# Patient Record
Sex: Male | Born: 1935 | Race: White | Hispanic: No | Marital: Married | Smoking: Never smoker
Health system: Southern US, Community
[De-identification: ages and names within clinical notes are randomized; demographics above are authoritative.]

## PROBLEM LIST (undated history)

## (undated) DIAGNOSIS — E7800 Pure hypercholesterolemia, unspecified: Secondary | ICD-10-CM

## (undated) DIAGNOSIS — Z9289 Personal history of other medical treatment: Secondary | ICD-10-CM

## (undated) DIAGNOSIS — I779 Disorder of arteries and arterioles, unspecified: Secondary | ICD-10-CM

## (undated) DIAGNOSIS — I774 Celiac artery compression syndrome: Secondary | ICD-10-CM

## (undated) DIAGNOSIS — H9190 Unspecified hearing loss, unspecified ear: Secondary | ICD-10-CM

## (undated) DIAGNOSIS — I10 Essential (primary) hypertension: Secondary | ICD-10-CM

## (undated) DIAGNOSIS — I771 Stricture of artery: Secondary | ICD-10-CM

## (undated) DIAGNOSIS — C801 Malignant (primary) neoplasm, unspecified: Secondary | ICD-10-CM

## (undated) DIAGNOSIS — I2510 Atherosclerotic heart disease of native coronary artery without angina pectoris: Secondary | ICD-10-CM

## (undated) DIAGNOSIS — M160 Bilateral primary osteoarthritis of hip: Secondary | ICD-10-CM

## (undated) DIAGNOSIS — M6740 Ganglion, unspecified site: Secondary | ICD-10-CM

## (undated) DIAGNOSIS — I739 Peripheral vascular disease, unspecified: Secondary | ICD-10-CM

## (undated) DIAGNOSIS — Z87442 Personal history of urinary calculi: Secondary | ICD-10-CM

## (undated) HISTORY — DX: Disorder of arteries and arterioles, unspecified: I77.9

## (undated) HISTORY — DX: Personal history of other medical treatment: Z92.89

## (undated) HISTORY — DX: Peripheral vascular disease, unspecified: I73.9

## (undated) HISTORY — PX: COLONOSCOPY: SHX174

## (undated) HISTORY — DX: Pure hypercholesterolemia, unspecified: E78.00

## (undated) HISTORY — PX: NO PAST SURGERIES: SHX2092

## (undated) HISTORY — DX: Essential (primary) hypertension: I10

---

## 2008-11-27 ENCOUNTER — Emergency Department: Payer: Self-pay | Admitting: Unknown Physician Specialty

## 2008-12-07 ENCOUNTER — Emergency Department: Payer: Self-pay | Admitting: Emergency Medicine

## 2012-05-19 ENCOUNTER — Telehealth: Payer: Self-pay | Admitting: Internal Medicine

## 2012-05-19 ENCOUNTER — Ambulatory Visit: Payer: Self-pay | Admitting: Internal Medicine

## 2012-05-19 NOTE — Telephone Encounter (Signed)
Pt had appt today at 8:00 a.m., thought his appt was at 2:00 so was not seen.  Pt was r/s to 07/2012 for his new pt appointment but will run out of his meds in the next few weeks.  Pt is concerned as he does not have any other doctor that can get his meds refilled for him.  Please advise.  [phone] wife's cell or [phone] Jasson's cell but he does not keep his phone on much.  Would prefer to call Chuong Casebeer (wife).

## 2012-05-19 NOTE — Telephone Encounter (Signed)
Do you want me to send enough rx's to last him until his appointment?

## 2012-05-20 ENCOUNTER — Telehealth: Payer: Self-pay | Admitting: Internal Medicine

## 2012-05-20 NOTE — Telephone Encounter (Signed)
Agrees that pt will be seen Fri 3/21 at 3:30 for a new pt appt.  Pt is aware of appt.

## 2012-05-20 NOTE — Telephone Encounter (Signed)
See if he can come in on Friday 04/24/12 at 3:30.   New pt

## 2012-05-22 ENCOUNTER — Encounter: Payer: Self-pay | Admitting: Internal Medicine

## 2012-05-22 ENCOUNTER — Ambulatory Visit (INDEPENDENT_AMBULATORY_CARE_PROVIDER_SITE_OTHER): Payer: Medicare Other | Admitting: Internal Medicine

## 2012-05-22 VITALS — BP 140/80 | HR 69 | Temp 97.9°F | Ht 70.5 in | Wt 215.0 lb

## 2012-05-22 DIAGNOSIS — E7800 Pure hypercholesterolemia, unspecified: Secondary | ICD-10-CM

## 2012-05-22 DIAGNOSIS — I10 Essential (primary) hypertension: Secondary | ICD-10-CM

## 2012-05-22 DIAGNOSIS — Z125 Encounter for screening for malignant neoplasm of prostate: Secondary | ICD-10-CM

## 2012-05-24 ENCOUNTER — Encounter: Payer: Self-pay | Admitting: Internal Medicine

## 2012-05-24 DIAGNOSIS — E7800 Pure hypercholesterolemia, unspecified: Secondary | ICD-10-CM | POA: Insufficient documentation

## 2012-05-24 DIAGNOSIS — E785 Hyperlipidemia, unspecified: Secondary | ICD-10-CM | POA: Insufficient documentation

## 2012-05-24 DIAGNOSIS — I10 Essential (primary) hypertension: Secondary | ICD-10-CM | POA: Insufficient documentation

## 2012-05-24 NOTE — Assessment & Plan Note (Signed)
Blood pressure as outlined.  Have him spot check his pressure and record.  Get him back in soon to reassess.  Adjust medication of needed.  Check metabolic panel.

## 2012-05-24 NOTE — Assessment & Plan Note (Signed)
On simvastatin.  Low cholesterol diet and exercise.  Check lipid panel and liver function.  

## 2012-05-24 NOTE — Progress Notes (Signed)
  Subjective:    Patient ID: Brett Riley, male    DOB: 11-07-1935, 77 y.o.   MRN: [id number]  HPI 77 year old male with past history of hypertension and hypercholesterolemia who comes in today to establish care.  Was previously followed by Mariah Milling and most recently Dr Alison Murray.  States he stays active.  Still works 60 plus hours per week.  No chest pain or tightness with increased activity or exertion.  Eating and drinking well.  No nausea or vomiting.  No bowel change.  States he is up to date with colonoscopy.     Past Medical History  Diagnosis Date  . Hypercholesterolemia   . Hypertension     Outpatient Encounter Prescriptions as of 05/22/2012  Medication Sig Dispense Refill  . aspirin 81 MG tablet Take 81 mg by mouth daily.      . hydrochlorothiazide (HYDRODIURIL) 25 MG tablet Take 25 mg by mouth daily.      . metoprolol succinate (TOPROL-XL) 100 MG 24 hr tablet Take 100 mg by mouth daily. Take with or immediately following a meal.      . Multiple Vitamin (MULTIVITAMIN) tablet Take 1 tablet by mouth daily.      . simvastatin (ZOCOR) 20 MG tablet Take 20 mg by mouth every evening.      . [DISCONTINUED] metoprolol (LOPRESSOR) 100 MG tablet Take 100 mg by mouth daily.       No facility-administered encounter medications on file as of 05/22/2012.    Review of Systems Patient denies any headache, lightheadedness or dizziness.  No sinus or allergy symptoms.  No chest pain, tightness or palpitations.  No increased shortness of breath, cough or congestion.  No nausea or vomiting.  No acid reflux.   No abdominal pain or cramping.  No bowel change, such as diarrhea, constipation, BRBPR or melana.  No urine change.   Stays active.  No cardiac symptoms with increased activity or exertion.      Objective:   Physical Exam Filed Vitals:   05/22/12 1542  BP: 140/80  Pulse: 69  Temp: 97.9 F (48.43 C)   77 year old male in no acute distress.  HEENT:  Nares - clear.  Oropharynx -  without lesions. NECK:  Supple.  Nontender.  No audible carotid bruit.  HEART:  Appears to be regular.   LUNGS:  No crackles or wheezing audible.  Respirations even and unlabored.   RADIAL PULSE:  Equal bilaterally.  ABDOMEN:  Soft.  Nontender.  Bowel sounds present and normal.  No audible abdominal bruit.   EXTREMITIES:  No increased edema present.  DP pulses palpable and equal bilaterally.         Assessment & Plan:  CARDIOVASCULAR.  Stays active.  No cardiac symptoms with increased activity or exertion.  Follow.   PULMONARY.  Breathing stable.    HEALTH MAINTENANCE.  Schedule a physical next visit.  Obtain colonoscopy results.  States had a colonoscopy 4-5 years ago.  Check PSA and routine labs.  Obtain records to review.

## 2012-06-01 ENCOUNTER — Other Ambulatory Visit (INDEPENDENT_AMBULATORY_CARE_PROVIDER_SITE_OTHER): Payer: Medicare Other

## 2012-06-01 DIAGNOSIS — E7800 Pure hypercholesterolemia, unspecified: Secondary | ICD-10-CM

## 2012-06-01 DIAGNOSIS — I10 Essential (primary) hypertension: Secondary | ICD-10-CM

## 2012-06-01 DIAGNOSIS — Z125 Encounter for screening for malignant neoplasm of prostate: Secondary | ICD-10-CM

## 2012-06-01 LAB — HEPATIC FUNCTION PANEL
ALT: 20 U/L (ref 0–53)
AST: 25 U/L (ref 0–37)
Albumin: 4.1 g/dL (ref 3.5–5.2)
Alkaline Phosphatase: 66 U/L (ref 39–117)
Bilirubin, Direct: 0.1 mg/dL (ref 0.0–0.3)
Total Bilirubin: 0.9 mg/dL (ref 0.3–1.2)
Total Protein: 7.6 g/dL (ref 6.0–8.3)

## 2012-06-01 LAB — CBC WITH DIFFERENTIAL/PLATELET
Basophils Absolute: 0 10*3/uL (ref 0.0–0.1)
Basophils Relative: 0.6 % (ref 0.0–3.0)
Eosinophils Absolute: 0.2 10*3/uL (ref 0.0–0.7)
Eosinophils Relative: 3.8 % (ref 0.0–5.0)
HCT: 39.6 % (ref 39.0–52.0)
Hemoglobin: 13.4 g/dL (ref 13.0–17.0)
Lymphocytes Relative: 39.4 % (ref 12.0–46.0)
Lymphs Abs: 1.6 10*3/uL (ref 0.7–4.0)
MCHC: 33.9 g/dL (ref 30.0–36.0)
MCV: 91.9 fl (ref 78.0–100.0)
Monocytes Absolute: 0.4 10*3/uL (ref 0.1–1.0)
Monocytes Relative: 9.6 % (ref 3.0–12.0)
Neutro Abs: 1.9 10*3/uL (ref 1.4–7.7)
Neutrophils Relative %: 46.6 % (ref 43.0–77.0)
Platelets: 149 10*3/uL — ABNORMAL LOW (ref 150.0–400.0)
RBC: 4.31 Mil/uL (ref 4.22–5.81)
RDW: 15.2 % — ABNORMAL HIGH (ref 11.5–14.6)
WBC: 4 10*3/uL — ABNORMAL LOW (ref 4.5–10.5)

## 2012-06-01 LAB — BASIC METABOLIC PANEL WITH GFR
BUN: 21 mg/dL (ref 6–23)
CO2: 28 meq/L (ref 19–32)
Calcium: 9.4 mg/dL (ref 8.4–10.5)
Chloride: 101 meq/L (ref 96–112)
Creatinine, Ser: 0.9 mg/dL (ref 0.4–1.5)
GFR: 88.7 mL/min
Glucose, Bld: 99 mg/dL (ref 70–99)
Potassium: 4 meq/L (ref 3.5–5.1)
Sodium: 137 meq/L (ref 135–145)

## 2012-06-01 LAB — LIPID PANEL
Cholesterol: 149 mg/dL (ref 0–200)
HDL: 36.7 mg/dL — ABNORMAL LOW
LDL Cholesterol: 98 mg/dL (ref 0–99)
Total CHOL/HDL Ratio: 4
Triglycerides: 71 mg/dL (ref 0.0–149.0)
VLDL: 14.2 mg/dL (ref 0.0–40.0)

## 2012-06-01 LAB — PSA, MEDICARE: PSA: 0.73 ng/mL (ref 0.10–4.00)

## 2012-06-01 LAB — TSH: TSH: 2.12 u[IU]/mL (ref 0.35–5.50)

## 2012-06-06 ENCOUNTER — Telehealth: Payer: Self-pay | Admitting: Internal Medicine

## 2012-06-06 DIAGNOSIS — D72819 Decreased white blood cell count, unspecified: Secondary | ICD-10-CM

## 2012-06-06 NOTE — Telephone Encounter (Signed)
Pt  Notified of lab results and need for a follow up lab in 2 weeks.  He is coming in 06/16/12 (8:00) for a follow up lab.  Please put on lab schedule.  Pt aware of appt.

## 2012-06-08 NOTE — Telephone Encounter (Signed)
Appointment made

## 2012-06-16 ENCOUNTER — Other Ambulatory Visit (INDEPENDENT_AMBULATORY_CARE_PROVIDER_SITE_OTHER): Payer: Medicare Other

## 2012-06-16 DIAGNOSIS — D72819 Decreased white blood cell count, unspecified: Secondary | ICD-10-CM

## 2012-06-16 LAB — CBC WITH DIFFERENTIAL/PLATELET
Basophils Absolute: 0 10*3/uL (ref 0.0–0.1)
Basophils Relative: 0.7 % (ref 0.0–3.0)
Eosinophils Absolute: 0.2 10*3/uL (ref 0.0–0.7)
Eosinophils Relative: 4.5 % (ref 0.0–5.0)
HCT: 35.4 % — ABNORMAL LOW (ref 39.0–52.0)
Hemoglobin: 12.1 g/dL — ABNORMAL LOW (ref 13.0–17.0)
Lymphocytes Relative: 45 % (ref 12.0–46.0)
Lymphs Abs: 1.9 10*3/uL (ref 0.7–4.0)
MCHC: 34.2 g/dL (ref 30.0–36.0)
MCV: 92.4 fl (ref 78.0–100.0)
Monocytes Absolute: 0.4 10*3/uL (ref 0.1–1.0)
Monocytes Relative: 10.2 % (ref 3.0–12.0)
Neutro Abs: 1.7 10*3/uL (ref 1.4–7.7)
Neutrophils Relative %: 39.6 % — ABNORMAL LOW (ref 43.0–77.0)
Platelets: 150 10*3/uL (ref 150.0–400.0)
RBC: 3.83 Mil/uL — ABNORMAL LOW (ref 4.22–5.81)
RDW: 15 % — ABNORMAL HIGH (ref 11.5–14.6)
WBC: 4.3 10*3/uL — ABNORMAL LOW (ref 4.5–10.5)

## 2012-06-24 ENCOUNTER — Other Ambulatory Visit: Payer: Self-pay | Admitting: Internal Medicine

## 2012-06-24 DIAGNOSIS — D649 Anemia, unspecified: Secondary | ICD-10-CM

## 2012-06-24 NOTE — Progress Notes (Signed)
Orders placed for follow up labs 

## 2012-07-21 ENCOUNTER — Ambulatory Visit (INDEPENDENT_AMBULATORY_CARE_PROVIDER_SITE_OTHER): Payer: Medicare Other | Admitting: Internal Medicine

## 2012-07-21 ENCOUNTER — Encounter: Payer: Self-pay | Admitting: Internal Medicine

## 2012-07-21 ENCOUNTER — Other Ambulatory Visit: Payer: Medicare Other

## 2012-07-21 VITALS — BP 130/80 | HR 65 | Temp 98.8°F | Ht 71.5 in | Wt 215.8 lb

## 2012-07-21 DIAGNOSIS — Z1211 Encounter for screening for malignant neoplasm of colon: Secondary | ICD-10-CM

## 2012-07-21 DIAGNOSIS — D696 Thrombocytopenia, unspecified: Secondary | ICD-10-CM

## 2012-07-21 DIAGNOSIS — E7800 Pure hypercholesterolemia, unspecified: Secondary | ICD-10-CM

## 2012-07-21 DIAGNOSIS — I10 Essential (primary) hypertension: Secondary | ICD-10-CM

## 2012-07-21 DIAGNOSIS — D649 Anemia, unspecified: Secondary | ICD-10-CM

## 2012-07-22 LAB — CBC WITH DIFFERENTIAL/PLATELET
Basophils Absolute: 0.1 10*3/uL (ref 0.0–0.1)
Basophils Relative: 1.2 % (ref 0.0–3.0)
Eosinophils Absolute: 0.2 10*3/uL (ref 0.0–0.7)
Eosinophils Relative: 4.9 % (ref 0.0–5.0)
HCT: 37.4 % — ABNORMAL LOW (ref 39.0–52.0)
Hemoglobin: 13 g/dL (ref 13.0–17.0)
Lymphocytes Relative: 40.3 % (ref 12.0–46.0)
Lymphs Abs: 2 10*3/uL (ref 0.7–4.0)
MCHC: 34.7 g/dL (ref 30.0–36.0)
MCV: 92.7 fl (ref 78.0–100.0)
Monocytes Absolute: 0.5 10*3/uL (ref 0.1–1.0)
Monocytes Relative: 9.8 % (ref 3.0–12.0)
Neutro Abs: 2.2 10*3/uL (ref 1.4–7.7)
Neutrophils Relative %: 43.8 % (ref 43.0–77.0)
Platelets: 141 10*3/uL — ABNORMAL LOW (ref 150.0–400.0)
RBC: 4.04 Mil/uL — ABNORMAL LOW (ref 4.22–5.81)
RDW: 14.9 % — ABNORMAL HIGH (ref 11.5–14.6)
WBC: 4.9 10*3/uL (ref 4.5–10.5)

## 2012-07-22 LAB — IBC PANEL
Iron: 91 ug/dL (ref 42–165)
Saturation Ratios: 21.4 % (ref 20.0–50.0)
Transferrin: 304.4 mg/dL (ref 212.0–360.0)

## 2012-07-22 LAB — FERRITIN: Ferritin: 25.6 ng/mL (ref 22.0–322.0)

## 2012-07-23 LAB — VITAMIN B12: Vitamin B-12: 244 pg/mL (ref 211–911)

## 2012-07-26 ENCOUNTER — Encounter: Payer: Self-pay | Admitting: Internal Medicine

## 2012-07-26 DIAGNOSIS — D649 Anemia, unspecified: Secondary | ICD-10-CM | POA: Insufficient documentation

## 2012-07-26 DIAGNOSIS — D696 Thrombocytopenia, unspecified: Secondary | ICD-10-CM | POA: Insufficient documentation

## 2012-07-26 NOTE — Assessment & Plan Note (Signed)
On simvastatin.  Low cholesterol diet and exercise.  Follow lipid panel and liver function.      

## 2012-07-26 NOTE — Assessment & Plan Note (Signed)
Recheck normal.  Follow.

## 2012-07-26 NOTE — Assessment & Plan Note (Signed)
Recheck iron studies and B12.

## 2012-07-26 NOTE — Assessment & Plan Note (Signed)
Blood pressure - controlled.  Follow metabolic panel.  Continue same medication regimen.

## 2012-07-26 NOTE — Progress Notes (Signed)
  Subjective:    Patient ID: Brett Riley, male    DOB: 18-Nov-1935, 77 y.o.   MRN: [id number]  HPI 77 year old male with past history of hypertension and hypercholesterolemia who comes in today to follow up on these issues as well as for a complete physical exam.  States he stays active.  Still works 60 plus hours per week.  No chest pain or tightness with increased activity or exertion.  Eating and drinking well.  No nausea or vomiting.  No bowel change.  States he is up to date with colonoscopy.  Overall he feels he is doing well.    Past Medical History  Diagnosis Date  . Hypercholesterolemia   . Hypertension     Outpatient Encounter Prescriptions as of 07/21/2012  Medication Sig Dispense Refill  . aspirin 81 MG tablet Take 81 mg by mouth daily.      . hydrochlorothiazide (HYDRODIURIL) 25 MG tablet Take 25 mg by mouth daily.      . metoprolol succinate (TOPROL-XL) 100 MG 24 hr tablet Take 100 mg by mouth daily. Take with or immediately following a meal.      . Multiple Vitamin (MULTIVITAMIN) tablet Take 1 tablet by mouth daily.      . Multiple Vitamins-Iron (MULTIVITAMINS WITH IRON) TABS Take 1 tablet by mouth daily.      . simvastatin (ZOCOR) 20 MG tablet Take 20 mg by mouth every evening.       No facility-administered encounter medications on file as of 07/21/2012.    Review of Systems Patient denies any headache, lightheadedness or dizziness.  No sinus or allergy symptoms.  No chest pain, tightness or palpitations.  No increased shortness of breath, cough or congestion.  No nausea or vomiting.  No acid reflux.   No abdominal pain or cramping.  No bowel change, such as diarrhea, constipation, BRBPR or melana.  No urine change.   Stays active.  No cardiac symptoms with increased activity or exertion.      Objective:   Physical Exam  Filed Vitals:   07/21/12 1547  BP: 130/80  Pulse: 65  Temp: 98.8 F (37.1 C)   Blood pressure recheck:  75/33  77 year old male in no acute  distress.  HEENT:  Nares - clear.  Oropharynx - without lesions. NECK:  Supple.  Nontender.  No audible carotid bruit.  HEART:  Appears to be regular.   LUNGS:  No crackles or wheezing audible.  Respirations even and unlabored.   RADIAL PULSE:  Equal bilaterally.  ABDOMEN:  Soft.  Nontender.  Bowel sounds present and normal.  No audible abdominal bruit.  GU:  Normal descended testicles.  No palpable testicular nodules.   RECTAL:  Could not appreciate any palpable prostate nodules.  Heme negative.   EXTREMITIES:  No increased edema present.  DP pulses palpable and equal bilaterally.         Assessment & Plan:  CARDIOVASCULAR.  Stays active.  No cardiac symptoms with increased activity or exertion.  Follow.   PULMONARY.  Breathing stable.    HEALTH MAINTENANCE.  Physical today.  Need colonoscopy results.  States had a colonoscopy 4-5 years ago.  PSA 06/01/12 - .73.  IFOB today.

## 2012-07-27 ENCOUNTER — Telehealth: Payer: Self-pay | Admitting: Internal Medicine

## 2012-07-27 DIAGNOSIS — I6522 Occlusion and stenosis of left carotid artery: Secondary | ICD-10-CM

## 2012-07-27 NOTE — Telephone Encounter (Signed)
Reviewed pts carotid ultrasound from 2013 (ordered by Dr Alison Murray).  Left internal carotid - 50-69%.  Needs f/u carotid ultrasound.  Pt notified.  Order placed for f/u carotid ultrasound.

## 2012-08-04 ENCOUNTER — Telehealth: Payer: Self-pay | Admitting: Internal Medicine

## 2012-08-04 NOTE — Telephone Encounter (Signed)
Wife/[name] calling about patient who has Left carotid ultrasound yesterday 08/03/2012.  Technician commenting that was unsure if he would need surgery or not.  Told that MD would call report yesterday evening 6/2 or today 6/3.  Now she is worried about results.  Please review and call patient at  [phone]. (see note wife's chart EPIC).

## 2012-08-04 NOTE — Telephone Encounter (Signed)
Wife informed of results,& would like to proceed with Vascular Surgery referral

## 2012-09-14 ENCOUNTER — Other Ambulatory Visit: Payer: Self-pay | Admitting: Internal Medicine

## 2012-09-16 ENCOUNTER — Encounter: Payer: Self-pay | Admitting: Internal Medicine

## 2012-12-10 ENCOUNTER — Other Ambulatory Visit: Payer: Self-pay | Admitting: *Deleted

## 2012-12-10 MED ORDER — SIMVASTATIN 20 MG PO TABS: 20.0000 mg | ORAL_TABLET | Freq: Every evening | ORAL | Status: DC

## 2013-01-21 ENCOUNTER — Ambulatory Visit (INDEPENDENT_AMBULATORY_CARE_PROVIDER_SITE_OTHER): Payer: Medicare Other | Admitting: Internal Medicine

## 2013-01-21 ENCOUNTER — Encounter: Payer: Self-pay | Admitting: Internal Medicine

## 2013-01-21 ENCOUNTER — Encounter (INDEPENDENT_AMBULATORY_CARE_PROVIDER_SITE_OTHER): Payer: Self-pay

## 2013-01-21 VITALS — BP 130/80 | HR 64 | Temp 97.8°F | Ht 71.0 in | Wt 214.0 lb

## 2013-01-21 DIAGNOSIS — I10 Essential (primary) hypertension: Secondary | ICD-10-CM

## 2013-01-21 DIAGNOSIS — I779 Disorder of arteries and arterioles, unspecified: Secondary | ICD-10-CM | POA: Insufficient documentation

## 2013-01-21 DIAGNOSIS — E7800 Pure hypercholesterolemia, unspecified: Secondary | ICD-10-CM

## 2013-01-21 DIAGNOSIS — D649 Anemia, unspecified: Secondary | ICD-10-CM

## 2013-01-21 DIAGNOSIS — I6522 Occlusion and stenosis of left carotid artery: Secondary | ICD-10-CM

## 2013-01-21 DIAGNOSIS — I6529 Occlusion and stenosis of unspecified carotid artery: Secondary | ICD-10-CM

## 2013-01-21 DIAGNOSIS — Z23 Encounter for immunization: Secondary | ICD-10-CM

## 2013-01-21 DIAGNOSIS — D696 Thrombocytopenia, unspecified: Secondary | ICD-10-CM

## 2013-01-21 LAB — HEPATIC FUNCTION PANEL
ALT: 23 U/L (ref 0–53)
AST: 27 U/L (ref 0–37)
Albumin: 4.2 g/dL (ref 3.5–5.2)
Alkaline Phosphatase: 72 U/L (ref 39–117)
Bilirubin, Direct: 0.2 mg/dL (ref 0.0–0.3)
Total Bilirubin: 1.1 mg/dL (ref 0.3–1.2)
Total Protein: 7.3 g/dL (ref 6.0–8.3)

## 2013-01-21 LAB — CBC WITH DIFFERENTIAL/PLATELET
Basophils Absolute: 0 10*3/uL (ref 0.0–0.1)
Basophils Relative: 0.7 % (ref 0.0–3.0)
Eosinophils Absolute: 0.2 10*3/uL (ref 0.0–0.7)
Eosinophils Relative: 4.4 % (ref 0.0–5.0)
HCT: 43.6 % (ref 39.0–52.0)
Hemoglobin: 15 g/dL (ref 13.0–17.0)
Lymphocytes Relative: 36.3 % (ref 12.0–46.0)
Lymphs Abs: 1.8 10*3/uL (ref 0.7–4.0)
MCHC: 34.4 g/dL (ref 30.0–36.0)
MCV: 96.2 fl (ref 78.0–100.0)
Monocytes Absolute: 0.4 10*3/uL (ref 0.1–1.0)
Monocytes Relative: 8.5 % (ref 3.0–12.0)
Neutro Abs: 2.5 10*3/uL (ref 1.4–7.7)
Neutrophils Relative %: 50.1 % (ref 43.0–77.0)
Platelets: 166 10*3/uL (ref 150.0–400.0)
RBC: 4.54 Mil/uL (ref 4.22–5.81)
RDW: 13.5 % (ref 11.5–14.6)
WBC: 4.9 10*3/uL (ref 4.5–10.5)

## 2013-01-21 LAB — BASIC METABOLIC PANEL WITH GFR
BUN: 15 mg/dL (ref 6–23)
CO2: 31 meq/L (ref 19–32)
Calcium: 9.9 mg/dL (ref 8.4–10.5)
Chloride: 102 meq/L (ref 96–112)
Creatinine, Ser: 1.1 mg/dL (ref 0.4–1.5)
GFR: 72.37 mL/min
Glucose, Bld: 99 mg/dL (ref 70–99)
Potassium: 4.3 meq/L (ref 3.5–5.1)
Sodium: 138 meq/L (ref 135–145)

## 2013-01-21 LAB — FERRITIN: Ferritin: 45.1 ng/mL (ref 22.0–322.0)

## 2013-01-21 LAB — LIPID PANEL
Cholesterol: 127 mg/dL (ref 0–200)
HDL: 37.3 mg/dL — ABNORMAL LOW
LDL Cholesterol: 69 mg/dL (ref 0–99)
Total CHOL/HDL Ratio: 3
Triglycerides: 106 mg/dL (ref 0.0–149.0)
VLDL: 21.2 mg/dL (ref 0.0–40.0)

## 2013-01-21 NOTE — Assessment & Plan Note (Addendum)
Iron studies and B12 normal.  Last hgb normal.  Has not given blood recently.  Obtain colonoscopy results.  Need IFOB results.  Recheck cbc today.

## 2013-01-21 NOTE — Assessment & Plan Note (Signed)
Blood pressure - slighlty elevated today.  Spot check his pressure.  Get him back in soon to reassess.   Follow metabolic panel.  Continue same medication regimen.

## 2013-01-21 NOTE — Progress Notes (Signed)
  Subjective:    Patient ID: Brett Riley, male    DOB: 11-23-1935, 78 y.o.   MRN: [id number]  HPI 77 year old male with past history of hypertension and hypercholesterolemia who comes in today for a scheduled follow up.  States he stays active.  Still works 60 plus hours per week.  No chest pain or tightness with increased activity or exertion.  Eating and drinking well.  No nausea or vomiting.  No bowel change.  States he is up to date with colonoscopy.  Overall he feels he is doing well.  Has not given blood recently.     Past Medical History  Diagnosis Date  . Hypercholesterolemia   . Hypertension     Outpatient Encounter Prescriptions as of 01/21/2013  Medication Sig  . aspirin 81 MG tablet Take 81 mg by mouth daily.  . hydrochlorothiazide (HYDRODIURIL) 25 MG tablet TAKE 1 TABLET BY MOUTH DAILY  . metoprolol succinate (TOPROL-XL) 100 MG 24 hr tablet TAKE 1 TABLET BY MOUTH DAILY  . Multiple Vitamins-Iron (MULTIVITAMINS WITH IRON) TABS Take 1 tablet by mouth daily.  . simvastatin (ZOCOR) 20 MG tablet Take 1 tablet (20 mg total) by mouth every evening.  . [DISCONTINUED] Multiple Vitamin (MULTIVITAMIN) tablet Take 1 tablet by mouth daily.    Review of Systems Patient denies any headache, lightheadedness or dizziness.  No sinus or allergy symptoms.  No chest pain, tightness or palpitations.  No increased shortness of breath, cough or congestion.  No nausea or vomiting.  No acid reflux.   No abdominal pain or cramping.  No bowel change, such as diarrhea, constipation, BRBPR or melana.  No urine change.   Stays active.  No cardiac symptoms with increased activity or exertion.      Objective:   Physical Exam  Filed Vitals:   01/21/13 0805  BP: 130/80  Pulse: 64  Temp: 97.8 F (36.6 C)   Blood pressure recheck:  20-61/59  77 year old male in no acute distress.  HEENT:  Nares - clear.  Oropharynx - without lesions. NECK:  Supple.  Nontender.  No audible carotid bruit.  HEART:   Appears to be regular.   LUNGS:  No crackles or wheezing audible.  Respirations even and unlabored.   RADIAL PULSE:  Equal bilaterally.  ABDOMEN:  Soft.  Nontender.  Bowel sounds present and normal.  No audible abdominal bruit.   EXTREMITIES:  No increased edema present.  DP pulses palpable and equal bilaterally.         Assessment & Plan:  CARDIOVASCULAR.  Stays active.  No cardiac symptoms with increased activity or exertion.  Follow.   PULMONARY.  Breathing stable.    HEALTH MAINTENANCE.  Physical last visit.  Need colonoscopy results.  States had a colonoscopy 4-5 years ago.  PSA 06/01/12 - .73.  IFOB given last visit.  No results.  States he returned.  Obtain results.

## 2013-01-21 NOTE — Progress Notes (Signed)
Pre-visit discussion using our clinic review tool. No additional management support is needed unless otherwise documented below in the visit note.  

## 2013-01-21 NOTE — Assessment & Plan Note (Addendum)
Last check still slightly decreased (approximately 140).  Will follow.  Recheck today.

## 2013-01-21 NOTE — Assessment & Plan Note (Signed)
On simvastatin.  Low cholesterol diet and exercise.  Follow lipid panel and liver function.  Recheck today.

## 2013-01-21 NOTE — Assessment & Plan Note (Signed)
Carotid ultrasound - with 50-69% stenosis.  Will refer to vascular surgery for them to follow.

## 2013-01-22 ENCOUNTER — Encounter: Payer: Self-pay | Admitting: *Deleted

## 2013-01-26 ENCOUNTER — Encounter: Payer: Self-pay | Admitting: Internal Medicine

## 2013-01-26 DIAGNOSIS — D649 Anemia, unspecified: Secondary | ICD-10-CM

## 2013-03-15 ENCOUNTER — Other Ambulatory Visit: Payer: Self-pay | Admitting: Internal Medicine

## 2013-03-31 ENCOUNTER — Encounter: Payer: Self-pay | Admitting: Internal Medicine

## 2013-03-31 ENCOUNTER — Ambulatory Visit (INDEPENDENT_AMBULATORY_CARE_PROVIDER_SITE_OTHER): Payer: Medicare HMO | Admitting: Internal Medicine

## 2013-03-31 VITALS — BP 120/80 | HR 62 | Temp 97.7°F | Ht 71.0 in | Wt 217.0 lb

## 2013-03-31 DIAGNOSIS — I10 Essential (primary) hypertension: Secondary | ICD-10-CM

## 2013-03-31 DIAGNOSIS — D696 Thrombocytopenia, unspecified: Secondary | ICD-10-CM

## 2013-03-31 DIAGNOSIS — D649 Anemia, unspecified: Secondary | ICD-10-CM

## 2013-03-31 DIAGNOSIS — E7800 Pure hypercholesterolemia, unspecified: Secondary | ICD-10-CM

## 2013-03-31 DIAGNOSIS — I6529 Occlusion and stenosis of unspecified carotid artery: Secondary | ICD-10-CM

## 2013-03-31 NOTE — Progress Notes (Signed)
Pre-visit discussion using our clinic review tool. No additional management support is needed unless otherwise documented below in the visit note.  

## 2013-04-04 ENCOUNTER — Encounter: Payer: Self-pay | Admitting: Internal Medicine

## 2013-04-04 NOTE — Assessment & Plan Note (Signed)
Blood pressure as outlined.  Follow.   

## 2013-04-04 NOTE — Progress Notes (Signed)
  Subjective:    Patient ID: Brett Riley, male    DOB: [birth year], 78 y.o.   MRN: [id number]  HPI 78 year old male with past history of hypertension and hypercholesterolemia who comes in today for a scheduled follow up.  States he stays active.  Still works 60 plus hours per week.  No chest pain or tightness with increased activity or exertion.  Eating and drinking well.  No nausea or vomiting.  No bowel change.  States he is up to date with colonoscopy.  Overall he feels he is doing well.  Has not given blood recently.  Stays active.     Past Medical History  Diagnosis Date  . Hypercholesterolemia   . Hypertension     Outpatient Encounter Prescriptions as of 03/31/2013  Medication Sig  . aspirin 81 MG tablet Take 81 mg by mouth daily.  . hydrochlorothiazide (HYDRODIURIL) 25 MG tablet TAKE 1 TABLET BY MOUTH EVERY DAY  . metoprolol succinate (TOPROL-XL) 100 MG 24 hr tablet TAKE 1 TABLET BY MOUTH EVERY DAY  . Multiple Vitamins-Iron (MULTIVITAMINS WITH IRON) TABS Take 1 tablet by mouth daily.  . simvastatin (ZOCOR) 20 MG tablet Take 1 tablet (20 mg total) by mouth every evening.    Review of Systems Patient denies any headache, lightheadedness or dizziness.  No sinus or allergy symptoms.  No chest pain, tightness or palpitations.  No increased shortness of breath, cough or congestion.  No nausea or vomiting.  No acid reflux.   No abdominal pain or cramping.  No bowel change, such as diarrhea, constipation, BRBPR or melana.  No urine change.   Stays active.  No cardiac symptoms with increased activity or exertion.      Objective:   Physical Exam  Filed Vitals:   03/31/13 0819  BP: 120/80  Pulse: 62  Temp: 97.7 F (36.5 C)   Blood pressure recheck:  72/3  78 year old male in no acute distress.  HEENT:  Nares - clear.  Oropharynx - without lesions. NECK:  Supple.  Nontender.  No audible carotid bruit.  HEART:  Appears to be regular.   LUNGS:  No crackles or wheezing audible.   Respirations even and unlabored.   RADIAL PULSE:  Equal bilaterally.  ABDOMEN:  Soft.  Nontender.  Bowel sounds present and normal.  No audible abdominal bruit.   EXTREMITIES:  No increased edema present.  DP pulses palpable and equal bilaterally.         Assessment & Plan:  CARDIOVASCULAR.  Stays active.  No cardiac symptoms with increased activity or exertion.  Follow.   PULMONARY.  Breathing stable.    HEALTH MAINTENANCE.  Physical 07/21/12.   States had a colonoscopy 4-5 years ago.  PSA 06/01/12 - .73.

## 2013-04-04 NOTE — Assessment & Plan Note (Signed)
Iron studies and B12 normal.  Last hgb normal.  Has not given blood recently.  Follow.

## 2013-04-04 NOTE — Assessment & Plan Note (Signed)
Last check improved.  Follow.

## 2013-04-04 NOTE — Assessment & Plan Note (Signed)
On simvastatin.  Low cholesterol diet and exercise.  Follow lipid panel and liver function.      

## 2013-04-04 NOTE — Assessment & Plan Note (Signed)
Carotid ultrasound - with 50-69% stenosis.  Was referred to vascular for them to follow.

## 2013-04-19 ENCOUNTER — Other Ambulatory Visit: Payer: Self-pay | Admitting: *Deleted

## 2013-04-19 MED ORDER — SIMVASTATIN 20 MG PO TABS: 20.0000 mg | ORAL_TABLET | Freq: Every evening | ORAL | Status: DC

## 2013-04-19 MED ORDER — METOPROLOL SUCCINATE ER 100 MG PO TB24: ORAL_TABLET | ORAL | Status: DC

## 2013-04-19 MED ORDER — HYDROCHLOROTHIAZIDE 25 MG PO TABS: ORAL_TABLET | ORAL | Status: DC

## 2013-07-28 ENCOUNTER — Encounter: Payer: Self-pay | Admitting: Internal Medicine

## 2013-07-28 ENCOUNTER — Encounter (INDEPENDENT_AMBULATORY_CARE_PROVIDER_SITE_OTHER): Payer: Self-pay

## 2013-07-28 ENCOUNTER — Telehealth: Payer: Self-pay | Admitting: Internal Medicine

## 2013-07-28 ENCOUNTER — Ambulatory Visit (INDEPENDENT_AMBULATORY_CARE_PROVIDER_SITE_OTHER): Payer: Commercial Managed Care - HMO | Admitting: Internal Medicine

## 2013-07-28 VITALS — BP 106/70 | HR 56 | Temp 98.3°F | Ht 71.0 in | Wt 212.2 lb

## 2013-07-28 DIAGNOSIS — D696 Thrombocytopenia, unspecified: Secondary | ICD-10-CM

## 2013-07-28 DIAGNOSIS — I6529 Occlusion and stenosis of unspecified carotid artery: Secondary | ICD-10-CM

## 2013-07-28 DIAGNOSIS — Z1211 Encounter for screening for malignant neoplasm of colon: Secondary | ICD-10-CM

## 2013-07-28 DIAGNOSIS — E7800 Pure hypercholesterolemia, unspecified: Secondary | ICD-10-CM

## 2013-07-28 DIAGNOSIS — R5383 Other fatigue: Secondary | ICD-10-CM | POA: Insufficient documentation

## 2013-07-28 DIAGNOSIS — Z125 Encounter for screening for malignant neoplasm of prostate: Secondary | ICD-10-CM

## 2013-07-28 DIAGNOSIS — D649 Anemia, unspecified: Secondary | ICD-10-CM

## 2013-07-28 DIAGNOSIS — Z9109 Other allergy status, other than to drugs and biological substances: Secondary | ICD-10-CM | POA: Insufficient documentation

## 2013-07-28 DIAGNOSIS — R5381 Other malaise: Secondary | ICD-10-CM

## 2013-07-28 DIAGNOSIS — I10 Essential (primary) hypertension: Secondary | ICD-10-CM

## 2013-07-28 LAB — CBC WITH DIFFERENTIAL/PLATELET
Basophils Absolute: 0 10*3/uL (ref 0.0–0.1)
Basophils Relative: 0.7 % (ref 0.0–3.0)
Eosinophils Absolute: 0.2 10*3/uL (ref 0.0–0.7)
Eosinophils Relative: 4.1 % (ref 0.0–5.0)
HCT: 40.8 % (ref 39.0–52.0)
Hemoglobin: 13.9 g/dL (ref 13.0–17.0)
Lymphocytes Relative: 40.3 % (ref 12.0–46.0)
Lymphs Abs: 1.8 10*3/uL (ref 0.7–4.0)
MCHC: 34.2 g/dL (ref 30.0–36.0)
MCV: 96.4 fl (ref 78.0–100.0)
Monocytes Absolute: 0.5 10*3/uL (ref 0.1–1.0)
Monocytes Relative: 9.9 % (ref 3.0–12.0)
Neutro Abs: 2 10*3/uL (ref 1.4–7.7)
Neutrophils Relative %: 45 % (ref 43.0–77.0)
Platelets: 164 10*3/uL (ref 150.0–400.0)
RBC: 4.23 Mil/uL (ref 4.22–5.81)
RDW: 13.8 % (ref 11.5–15.5)
WBC: 4.6 10*3/uL (ref 4.0–10.5)

## 2013-07-28 LAB — PSA, MEDICARE: PSA: 0.79 ng/mL (ref 0.10–4.00)

## 2013-07-28 LAB — LIPID PANEL
Cholesterol: 126 mg/dL (ref 0–200)
HDL: 35.4 mg/dL — ABNORMAL LOW
LDL Cholesterol: 75 mg/dL (ref 0–99)
Total CHOL/HDL Ratio: 4
Triglycerides: 78 mg/dL (ref 0.0–149.0)
VLDL: 15.6 mg/dL (ref 0.0–40.0)

## 2013-07-28 LAB — HEPATIC FUNCTION PANEL
ALT: 26 U/L (ref 0–53)
AST: 26 U/L (ref 0–37)
Albumin: 4.1 g/dL (ref 3.5–5.2)
Alkaline Phosphatase: 61 U/L (ref 39–117)
Bilirubin, Direct: 0.1 mg/dL (ref 0.0–0.3)
Total Bilirubin: 1 mg/dL (ref 0.2–1.2)
Total Protein: 7.2 g/dL (ref 6.0–8.3)

## 2013-07-28 LAB — TSH: TSH: 1.65 u[IU]/mL (ref 0.35–4.50)

## 2013-07-28 LAB — BASIC METABOLIC PANEL WITH GFR
BUN: 18 mg/dL (ref 6–23)
CO2: 29 meq/L (ref 19–32)
Calcium: 9.7 mg/dL (ref 8.4–10.5)
Chloride: 103 meq/L (ref 96–112)
Creatinine, Ser: 1 mg/dL (ref 0.4–1.5)
GFR: 74.71 mL/min
Glucose, Bld: 94 mg/dL (ref 70–99)
Potassium: 4.3 meq/L (ref 3.5–5.1)
Sodium: 140 meq/L (ref 135–145)

## 2013-07-28 NOTE — Progress Notes (Signed)
Pre visit review using our clinic review tool, if applicable. No additional management support is needed unless otherwise documented below in the visit note. 

## 2013-07-28 NOTE — Telephone Encounter (Signed)
[facility]  [facility].  Spoke with Jenny Reichmann , states Dr Sonny Masters noted that no repeat colonoscopy was necessary due to no family hx ,no polyps,and due to age. Do you want [place] to cancel this referral ,and make patient aware.

## 2013-07-28 NOTE — Progress Notes (Signed)
Subjective:    Patient ID: Brett Riley, male    DOB: 1936/02/12, 78 y.o.   MRN: [id number]  HPI 78 year old male with past history of hypertension and hypercholesterolemia who comes in today to follow up on these issues as well as for a complete physical exam.  States he stays active.  Still works 60 plus hours per week.  No chest pain or tightness with increased activity or exertion.  Eating and drinking well.  No nausea or vomiting.  No bowel change.  Overall he feels he is doing well.  Has not given blood recently.  Stays active.  States that starting three weeks ago, he developed some nasal congestion.  Some cough.  No sob.  No sore throat and no fever.  Some fatigue.  Taking mucinex.  Is better.  Feels better.   Blood pressure averaging 100-120s/60-70s.     Past Medical History  Diagnosis Date  . Hypercholesterolemia   . Hypertension     Outpatient Encounter Prescriptions as of 07/28/2013  Medication Sig  . aspirin 81 MG tablet Take 81 mg by mouth daily.  . hydrochlorothiazide (HYDRODIURIL) 25 MG tablet TAKE 1 TABLET BY MOUTH EVERY DAY  . metoprolol succinate (TOPROL-XL) 100 MG 24 hr tablet TAKE 1 TABLET BY MOUTH EVERY DAY  . Multiple Vitamins-Iron (MULTIVITAMINS WITH IRON) TABS Take 1 tablet by mouth daily.  . simvastatin (ZOCOR) 20 MG tablet Take 1 tablet (20 mg total) by mouth every evening.    Review of Systems Patient denies any headache, lightheadedness or dizziness.  Some previous nasal congestion and cough.  Better now.  Took mucinex.   No chest pain, tightness or palpitations.  No increased shortness of breath.   No nausea or vomiting.  No acid reflux.   No abdominal pain or cramping.  No bowel change, such as diarrhea, constipation, BRBPR or melana.  No urine change.   Stays active.  No cardiac symptoms with increased activity or exertion.      Objective:   Physical Exam  Filed Vitals:   07/28/13 0830  BP: 106/70  Pulse: 56  Temp: 98.3 F (36.8 C)   Blood pressure  recheck:  20/57  78 year old male in no acute distress.  HEENT:  Nares - clear.  Oropharynx - without lesions. NECK:  Supple.  Nontender.  No audible carotid bruit.  HEART:  Appears to be regular.   LUNGS:  No crackles or wheezing audible.  Respirations even and unlabored.   RADIAL PULSE:  Equal bilaterally.  ABDOMEN:  Soft.  Nontender.  Bowel sounds present and normal.  No audible abdominal bruit.  GU:  Normal descended testicles.  No palpable testicular nodules.   RECTAL:  Could not appreciate any palpable prostate nodules.  Heme negative.   EXTREMITIES:  No increased edema present.  DP pulses palpable and equal bilaterally.         Assessment & Plan:  CARDIOVASCULAR.  Stays active.  No cardiac symptoms with increased activity or exertion.  Follow.   PULMONARY.  Breathing stable.  Cough and congestion improved.  Do not feel abx warranted.  muncinex and robitussin as directed.  Nasacort as directed.  Follow.    HEALTH MAINTENANCE.  Physical today.   Last colonoscopy 2005.  Per [facility] records - due f/u 2015.  Will refer.  PSA 06/01/12 - .73.   Check PSA today.    I spent 25 minutes with the patient and more than 50% of the time was spent in  consultation regarding the above.

## 2013-07-28 NOTE — Telephone Encounter (Signed)
I would like to proceed with referral.  This pt was 67 when he had his last colonoscopy (2005).  The documentation I received stated - due follow up in 10 years.  I would like to proceed with referral.  Pt is 77 and still working 60 plus hours per week.  Let me know if you need anything else.  Thanks.    Dr Nicki Reaper

## 2013-07-28 NOTE — Patient Instructions (Signed)
Saline nasal spray - flush nose at least 2-3x/day Nasacort - two sprays each nostril one time per day (do this in the evening) Mucinex in the am and Robitussin in the evening.

## 2013-07-30 ENCOUNTER — Encounter: Payer: Self-pay | Admitting: *Deleted

## 2013-08-01 ENCOUNTER — Telehealth: Payer: Self-pay | Admitting: Internal Medicine

## 2013-08-01 ENCOUNTER — Encounter: Payer: Self-pay | Admitting: Internal Medicine

## 2013-08-01 DIAGNOSIS — I6529 Occlusion and stenosis of unspecified carotid artery: Secondary | ICD-10-CM

## 2013-08-01 NOTE — Assessment & Plan Note (Signed)
Colonoscopy as outlined.  Refer to GI for f/u colonoscopy.

## 2013-08-01 NOTE — Assessment & Plan Note (Signed)
Blood pressure as outlined.  Doing well.  Follow.   

## 2013-08-01 NOTE — Assessment & Plan Note (Signed)
Check cbc, met c and tsh.  Follow.  Stays active.

## 2013-08-01 NOTE — Assessment & Plan Note (Signed)
Carotid ultrasound - with 50-69% stenosis.  Was referred to vascular for them to follow.  Continue daily aspirin.

## 2013-08-01 NOTE — Assessment & Plan Note (Signed)
On simvastatin.  Low cholesterol diet and exercise.  Follow lipid panel and liver function.      

## 2013-08-01 NOTE — Telephone Encounter (Signed)
Please notify pt that I would like to schedule him for a f/u regarding his carotid arteries.  I would like to refer him back over the [facility] and have them repeat a carotid ultrasound..  If agreeable, let me know and I will place the order for the referral.

## 2013-08-01 NOTE — Assessment & Plan Note (Signed)
Some nasal congestion and sore throat previously.  Symptoms have improved.  nasacort as directed.  mucinex and robitussin as directed.  Follow.

## 2013-08-01 NOTE — Assessment & Plan Note (Signed)
Last check improved.  Follow.

## 2013-08-02 NOTE — Telephone Encounter (Signed)
LMTCB on cell

## 2013-08-03 NOTE — Telephone Encounter (Signed)
Pt returned my call & okay to proceed with carotid ultrasound

## 2013-08-04 NOTE — Telephone Encounter (Signed)
Order placed for referral to vascular surgery.  

## 2013-08-12 ENCOUNTER — Ambulatory Visit: Payer: Self-pay | Admitting: Gastroenterology

## 2013-08-12 LAB — HM COLONOSCOPY
HM Colonoscopy: NORMAL
HM Colonoscopy: NORMAL

## 2013-08-19 ENCOUNTER — Encounter: Payer: Self-pay | Admitting: *Deleted

## 2013-08-19 DIAGNOSIS — D649 Anemia, unspecified: Secondary | ICD-10-CM

## 2013-09-22 ENCOUNTER — Encounter: Payer: Self-pay | Admitting: Internal Medicine

## 2013-09-22 ENCOUNTER — Telehealth: Payer: Self-pay

## 2013-09-22 DIAGNOSIS — I6529 Occlusion and stenosis of unspecified carotid artery: Secondary | ICD-10-CM

## 2013-09-22 MED ORDER — METOPROLOL SUCCINATE ER 100 MG PO TB24: ORAL_TABLET | ORAL | Status: DC

## 2013-09-22 MED ORDER — HYDROCHLOROTHIAZIDE 25 MG PO TABS: ORAL_TABLET | ORAL | Status: DC

## 2013-09-22 NOTE — Telephone Encounter (Signed)
The patient's wife called and is hoping to get refills on the pt's medication She stated he receives his medication through a [employer] order (fax- [phone])   Refills for:  Hydrochlorothiazide 25mg  Metoprolol - 100mg 

## 2013-09-22 NOTE — Telephone Encounter (Signed)
Rx sent to pharmacy by escript  

## 2013-11-01 ENCOUNTER — Other Ambulatory Visit: Payer: Self-pay | Admitting: *Deleted

## 2013-11-01 MED ORDER — SIMVASTATIN 20 MG PO TABS: 20.0000 mg | ORAL_TABLET | Freq: Every evening | ORAL | Status: DC

## 2013-11-19 ENCOUNTER — Telehealth: Payer: Self-pay | Admitting: *Deleted

## 2013-11-19 NOTE — Telephone Encounter (Signed)
Pt. Wife called to report that the area on his bottom is still there and it is spongy. Wife states that she spoke with you recently about this after his fall. She states that you mentioned that he could go to walk in, get xrays or be seen here. I offered appt on Monday @ 10:30 & he could not come on Monday. Wife states any day except Monday. Please advise.

## 2013-11-22 ENCOUNTER — Encounter: Payer: Self-pay | Admitting: Internal Medicine

## 2013-11-22 ENCOUNTER — Ambulatory Visit (INDEPENDENT_AMBULATORY_CARE_PROVIDER_SITE_OTHER): Payer: Commercial Managed Care - HMO | Admitting: Internal Medicine

## 2013-11-22 VITALS — BP 120/80 | HR 60 | Temp 98.3°F | Ht 71.0 in | Wt 211.5 lb

## 2013-11-22 DIAGNOSIS — R2242 Localized swelling, mass and lump, left lower limb: Secondary | ICD-10-CM

## 2013-11-22 DIAGNOSIS — M7989 Other specified soft tissue disorders: Secondary | ICD-10-CM

## 2013-11-22 DIAGNOSIS — M799 Soft tissue disorder, unspecified: Secondary | ICD-10-CM

## 2013-11-22 DIAGNOSIS — R229 Localized swelling, mass and lump, unspecified: Secondary | ICD-10-CM

## 2013-11-22 DIAGNOSIS — Z23 Encounter for immunization: Secondary | ICD-10-CM

## 2013-11-22 NOTE — Progress Notes (Signed)
Pre visit review using our clinic review tool, if applicable. No additional management support is needed unless otherwise documented below in the visit note. 

## 2013-11-22 NOTE — Telephone Encounter (Signed)
See if he can come 11:45-12:00 on Thursday 11/25/13.

## 2013-11-22 NOTE — Telephone Encounter (Signed)
Pt coming in today at 2pm (had a cancellation)

## 2013-11-25 ENCOUNTER — Encounter: Payer: Self-pay | Admitting: *Deleted

## 2013-11-28 ENCOUNTER — Encounter: Payer: Self-pay | Admitting: Internal Medicine

## 2013-11-28 DIAGNOSIS — M7989 Other specified soft tissue disorders: Secondary | ICD-10-CM | POA: Insufficient documentation

## 2013-11-28 NOTE — Assessment & Plan Note (Signed)
Is s/p fall.  Persistent soft tissue mass.  No significant pain.  Not improving.  Question of hematoma.  Given persistence not improving, will refer to surgery for further evaluation and treatment management.

## 2013-11-28 NOTE — Progress Notes (Signed)
  Subjective:    Patient ID: Brett Riley, male    DOB: September 13, 1935, 78 y.o.   MRN: [id number]  Wound Check  77 year old male with past history of hypertension and hypercholesterolemia who comes in today as a work in with concerns regarding a persistent increased fullness - upper thight/buttock s/p fall.  States he fell several weeks ago.  [place] off a ladder.  Was 3-4 feet off ground.  Ladder flipped.  Landed on his left hip.  No head injury.  States has had a persistent soft tissue mass since the fall.  No increased redness or warmth.  No signficant pain.  Able to walk and work without problems.  Concerned because persistent.  Size not improving.  Taking motrin.      Past Medical History  Diagnosis Date  . Hypercholesterolemia   . Hypertension     Outpatient Encounter Prescriptions as of 11/22/2013  Medication Sig  . aspirin 81 MG tablet Take 81 mg by mouth daily.  . hydrochlorothiazide (HYDRODIURIL) 25 MG tablet TAKE 1 TABLET BY MOUTH EVERY DAY  . metoprolol succinate (TOPROL-XL) 100 MG 24 hr tablet TAKE 1 TABLET BY MOUTH EVERY DAY  . Multiple Vitamins-Iron (MULTIVITAMINS WITH IRON) TABS Take 1 tablet by mouth daily.  . simvastatin (ZOCOR) 20 MG tablet Take 1 tablet (20 mg total) by mouth every evening.    Review of Systems Patient denies any headache, lightheadedness or dizziness.   No chest pain, tightness or palpitations. No increased shortness of breath.  No significant back, hip or leg pain.  Increased soft tissue mass - left buttock.  No increased redness or warmth.       Objective:   Physical Exam  Filed Vitals:   11/22/13 1354  BP: 120/80  Pulse: 60  Temp: 98.3 F (48.51 C)   78 year old male in no acute distress.  HEART:  Appears to be regular.   LUNGS:  No crackles or wheezing audible.  Respirations even and unlabored.    EXTREMITIES:  No increased edema present.   MSK:  No pain with standing or rotation of his hip.  No increased redness or warmth.  Increased soft  tissue mass - left lower buttock.  No significant pain to palpation.          Assessment & Plan:  HEALTH MAINTENANCE.  Physical last visit.   Last colonoscopy 2005.  Per [facility] records - due f/u 2015.  Referred.  PSA 06/01/12 - .73.   PSA 07/28/13 - .79.

## 2013-12-09 ENCOUNTER — Encounter: Payer: Self-pay | Admitting: General Surgery

## 2013-12-09 ENCOUNTER — Ambulatory Visit (INDEPENDENT_AMBULATORY_CARE_PROVIDER_SITE_OTHER): Payer: Commercial Managed Care - HMO | Admitting: General Surgery

## 2013-12-09 VITALS — BP 128/68 | HR 60 | Resp 12 | Ht 72.0 in | Wt 208.0 lb

## 2013-12-09 DIAGNOSIS — R2242 Localized swelling, mass and lump, left lower limb: Secondary | ICD-10-CM

## 2013-12-09 NOTE — Patient Instructions (Addendum)
Patient to use a heating pad. Patient to return as needed.

## 2013-12-09 NOTE — Progress Notes (Signed)
Patient ID: Brett Riley, male   DOB: 07/31/1935, 78 y.o.   MRN: [id number]  Chief Complaint  Patient presents with  . Other    left hip mass    HPI Brett Riley is a 78 y.o. male here today for a evaluation of a left hip mass. Patient states he fell off a ladder  four weeks ago. He had been taking down an old tin roof prior to having a new roof installed.  The latter was on uneven floating, and he fell on the ladder about 4-5 feet. Since that time a mass has been present on the left hip. At one time bruising extended down below the level of the knee.He states there is a knot on his left hip that feels spongy. HPI  Past Medical History  Diagnosis Date  . Hypercholesterolemia   . Hypertension     Past Surgical History  Procedure Laterality Date  . No past surgeries      Family History  Problem Relation Age of Onset  . Heart disease Father     MI  . Stroke Mother   . Breast cancer Sister   . Colon cancer Neg Hx   . Prostate cancer Neg Hx     Social History History  Substance Use Topics  . Smoking status: Never Smoker   . Smokeless tobacco: Never Used  . Alcohol Use: No    No Known Allergies  Current Outpatient Prescriptions  Medication Sig Dispense Refill  . aspirin 81 MG tablet Take 81 mg by mouth daily.      . hydrochlorothiazide (HYDRODIURIL) 25 MG tablet TAKE 1 TABLET BY MOUTH EVERY DAY  90 tablet  1  . metoprolol succinate (TOPROL-XL) 100 MG 24 hr tablet TAKE 1 TABLET BY MOUTH EVERY DAY  90 tablet  1  . Multiple Vitamins-Iron (MULTIVITAMINS WITH IRON) TABS Take 1 tablet by mouth daily.      . simvastatin (ZOCOR) 20 MG tablet Take 1 tablet (20 mg total) by mouth every evening.  30 tablet  0   No current facility-administered medications for this visit.    Review of Systems Review of Systems  Constitutional: Negative.   Respiratory: Negative.   Cardiovascular: Negative.     Blood pressure 128/68, pulse 60, resp. rate 12, height 6' (1.829 m), weight 208  lb (94.348 kg).  Physical Exam Physical Exam  Constitutional: He appears well-developed and well-nourished.  Musculoskeletal:       Legs: Skin:  7 by 10 cm left hip mass       Assessment    Left hip hematoma status post blunt trauma.     Plan    The patient is presently asymptomatic. I've encouraged him to make use of local heat to assist with resolution. Unless he would develop pain or noticed increase in size would avoid drainage.  The patient notify the office he fails to improve.     PCP: Nash Shearer 12/12/2013, 9:05 PM

## 2013-12-12 DIAGNOSIS — R2240 Localized swelling, mass and lump, unspecified lower limb: Secondary | ICD-10-CM | POA: Insufficient documentation

## 2014-02-01 ENCOUNTER — Encounter: Payer: Self-pay | Admitting: Internal Medicine

## 2014-02-01 ENCOUNTER — Ambulatory Visit (INDEPENDENT_AMBULATORY_CARE_PROVIDER_SITE_OTHER): Payer: Commercial Managed Care - HMO | Admitting: Internal Medicine

## 2014-02-01 VITALS — BP 137/73 | HR 56 | Temp 98.3°F | Ht 71.0 in | Wt 207.2 lb

## 2014-02-01 DIAGNOSIS — I10 Essential (primary) hypertension: Secondary | ICD-10-CM

## 2014-02-01 DIAGNOSIS — D696 Thrombocytopenia, unspecified: Secondary | ICD-10-CM

## 2014-02-01 DIAGNOSIS — D649 Anemia, unspecified: Secondary | ICD-10-CM

## 2014-02-01 DIAGNOSIS — R2242 Localized swelling, mass and lump, left lower limb: Secondary | ICD-10-CM

## 2014-02-01 DIAGNOSIS — I6529 Occlusion and stenosis of unspecified carotid artery: Secondary | ICD-10-CM

## 2014-02-01 DIAGNOSIS — Z23 Encounter for immunization: Secondary | ICD-10-CM

## 2014-02-01 DIAGNOSIS — E7800 Pure hypercholesterolemia, unspecified: Secondary | ICD-10-CM

## 2014-02-01 LAB — CBC WITH DIFFERENTIAL/PLATELET
Basophils Absolute: 0 10*3/uL (ref 0.0–0.1)
Basophils Relative: 0.6 % (ref 0.0–3.0)
Eosinophils Absolute: 0.2 10*3/uL (ref 0.0–0.7)
Eosinophils Relative: 4.3 % (ref 0.0–5.0)
HCT: 43.6 % (ref 39.0–52.0)
Hemoglobin: 14.8 g/dL (ref 13.0–17.0)
Lymphocytes Relative: 39.1 % (ref 12.0–46.0)
Lymphs Abs: 1.9 10*3/uL (ref 0.7–4.0)
MCHC: 33.9 g/dL (ref 30.0–36.0)
MCV: 96.4 fl (ref 78.0–100.0)
Monocytes Absolute: 0.4 10*3/uL (ref 0.1–1.0)
Monocytes Relative: 8.2 % (ref 3.0–12.0)
Neutro Abs: 2.3 10*3/uL (ref 1.4–7.7)
Neutrophils Relative %: 47.8 % (ref 43.0–77.0)
Platelets: 165 10*3/uL (ref 150.0–400.0)
RBC: 4.52 Mil/uL (ref 4.22–5.81)
RDW: 13.9 % (ref 11.5–15.5)
WBC: 4.7 10*3/uL (ref 4.0–10.5)

## 2014-02-01 LAB — FERRITIN: Ferritin: 25.5 ng/mL (ref 22.0–322.0)

## 2014-02-01 NOTE — Progress Notes (Signed)
  Subjective:    Patient ID: Brett Riley, male    DOB: [birth year], 78 y.o.   MRN: [id number]  HPI 78 year old male with past history of hypertension and hypercholesterolemia who comes in today for a scheduled follow up.  States he stays active.  Still works 60 plus hours per week.  No chest pain or tightness with increased activity or exertion.  Eating and drinking well.  No nausea or vomiting.  No bowel change.  Overall he feels he is doing well.  Has not given blood recently.  Would like to restart.  Overall feels good.       Past Medical History  Diagnosis Date  . Hypercholesterolemia   . Hypertension     Outpatient Encounter Prescriptions as of 02/01/2014  Medication Sig  . aspirin 81 MG tablet Take 81 mg by mouth daily.  . hydrochlorothiazide (HYDRODIURIL) 25 MG tablet TAKE 1 TABLET BY MOUTH EVERY DAY  . metoprolol succinate (TOPROL-XL) 100 MG 24 hr tablet TAKE 1 TABLET BY MOUTH EVERY DAY  . Multiple Vitamins-Iron (MULTIVITAMINS WITH IRON) TABS Take 1 tablet by mouth daily.  . simvastatin (ZOCOR) 20 MG tablet Take 1 tablet (20 mg total) by mouth every evening.    Review of Systems Patient denies any headache, lightheadedness or dizziness.   No chest pain, tightness or palpitations.  No increased shortness of breath.   No nausea or vomiting.  No acid reflux.   No abdominal pain or cramping.  No bowel change, such as diarrhea, constipation, BRBPR or melana.  No urine change.   Stays active.  No cardiac symptoms with increased activity or exertion.  Resolving hematoma.  Non tender.       Objective:   Physical Exam  Filed Vitals:   02/01/14 0858  BP: 137/73  Pulse: 56  Temp: 98.3 F (36.8 C)   Blood pressure recheck:  25/74  78 year old male in no acute distress.  HEENT:  Nares - clear.  Oropharynx - without lesions. NECK:  Supple.  Nontender.  No audible carotid bruit.  HEART:  Appears to be regular.   LUNGS:  No crackles or wheezing audible.  Respirations even and  unlabored.   RADIAL PULSE:  Equal bilaterally.  ABDOMEN:  Soft.  Nontender.  Bowel sounds present and normal.  No audible abdominal bruit.  EXTREMITIES:  No increased edema present.  DP pulses palpable and equal bilaterally.   MSK:  Still with residual hematoma.  Smaller.  No tenderness or redness.         Assessment & Plan:  Essential hypertension, benign Blood pressure doing well.  Same medication regimen.  Check met b.    Carotid stenosis, unspecified laterality Evaluated [facility] 09/22/13.  Carotid ultrasound 1-49% right ICA and 50-69% left ICA stenosis.  Recommended f/u in 6 months.    Hypercholesterolemia Low cholesterol diet and exercise.  Continue simvastatin.   Check lipid panel and liver function tests.   Thrombocytopenia Recheck cbc today.   Anemia, unspecified anemia type Recheck cbc today.  Colonoscopy as outlined.    Hip region mass, left Resolving hematoma.  Non tender.  Follow.    CARDIOVASCULAR.  Stays active.  No cardiac symptoms with increased activity or exertion.  Follow.   PULMONARY.  Breathing stable.   HEALTH MAINTENANCE.  Physical 07/28/13.   Last colonoscopy 08/12/2013 - normal.  Recommended f/u colonoscopy in 10 years.  PSA 07/28/13 - .79.

## 2014-02-01 NOTE — Addendum Note (Signed)
Addended by: Leeanne Rio on: 02/01/2014 09:49 AM   Modules accepted: Orders

## 2014-02-01 NOTE — Addendum Note (Signed)
Addended by: Johnsie Cancel on: 02/01/2014 09:59 AM   Modules accepted: Orders

## 2014-02-01 NOTE — Progress Notes (Signed)
Pre visit review using our clinic review tool, if applicable. No additional management support is needed unless otherwise documented below in the visit note. 

## 2014-02-02 LAB — LIPID PANEL
Cholesterol: 144 mg/dL (ref 0–200)
HDL: 38.5 mg/dL — ABNORMAL LOW
LDL Cholesterol: 89 mg/dL (ref 0–99)
NonHDL: 105.5
Total CHOL/HDL Ratio: 4
Triglycerides: 85 mg/dL (ref 0.0–149.0)
VLDL: 17 mg/dL (ref 0.0–40.0)

## 2014-02-02 LAB — BASIC METABOLIC PANEL WITH GFR
BUN: 22 mg/dL (ref 6–23)
CO2: 27 meq/L (ref 19–32)
Calcium: 9.6 mg/dL (ref 8.4–10.5)
Chloride: 102 meq/L (ref 96–112)
Creatinine, Ser: 1 mg/dL (ref 0.4–1.5)
GFR: 81.9 mL/min
Glucose, Bld: 90 mg/dL (ref 70–99)
Potassium: 3.9 meq/L (ref 3.5–5.1)
Sodium: 136 meq/L (ref 135–145)

## 2014-02-02 LAB — HEPATIC FUNCTION PANEL
ALT: 20 U/L (ref 0–53)
AST: 26 U/L (ref 0–37)
Albumin: 4.6 g/dL (ref 3.5–5.2)
Alkaline Phosphatase: 70 U/L (ref 39–117)
Bilirubin, Direct: 0.1 mg/dL (ref 0.0–0.3)
Total Bilirubin: 1 mg/dL (ref 0.2–1.2)
Total Protein: 7.5 g/dL (ref 6.0–8.3)

## 2014-02-03 ENCOUNTER — Encounter: Payer: Self-pay | Admitting: *Deleted

## 2014-04-18 ENCOUNTER — Other Ambulatory Visit: Payer: Self-pay | Admitting: Internal Medicine

## 2014-08-08 ENCOUNTER — Encounter: Payer: Commercial Managed Care - HMO | Admitting: Internal Medicine

## 2014-09-06 ENCOUNTER — Encounter: Payer: Self-pay | Admitting: Internal Medicine

## 2014-09-06 ENCOUNTER — Ambulatory Visit (INDEPENDENT_AMBULATORY_CARE_PROVIDER_SITE_OTHER): Payer: Commercial Managed Care - HMO | Admitting: Internal Medicine

## 2014-09-06 VITALS — BP 110/70 | HR 77 | Temp 98.3°F | Ht 71.0 in | Wt 209.5 lb

## 2014-09-06 DIAGNOSIS — Z125 Encounter for screening for malignant neoplasm of prostate: Secondary | ICD-10-CM | POA: Diagnosis not present

## 2014-09-06 DIAGNOSIS — E7800 Pure hypercholesterolemia, unspecified: Secondary | ICD-10-CM

## 2014-09-06 DIAGNOSIS — Z0000 Encounter for general adult medical examination without abnormal findings: Secondary | ICD-10-CM

## 2014-09-06 DIAGNOSIS — I10 Essential (primary) hypertension: Secondary | ICD-10-CM | POA: Diagnosis not present

## 2014-09-06 DIAGNOSIS — D696 Thrombocytopenia, unspecified: Secondary | ICD-10-CM

## 2014-09-06 DIAGNOSIS — I6529 Occlusion and stenosis of unspecified carotid artery: Secondary | ICD-10-CM

## 2014-09-06 DIAGNOSIS — D649 Anemia, unspecified: Secondary | ICD-10-CM

## 2014-09-06 LAB — HEPATIC FUNCTION PANEL
ALT: 19 U/L (ref 0–53)
AST: 20 U/L (ref 0–37)
Albumin: 4.3 g/dL (ref 3.5–5.2)
Alkaline Phosphatase: 70 U/L (ref 39–117)
Bilirubin, Direct: 0.2 mg/dL (ref 0.0–0.3)
Total Bilirubin: 1.1 mg/dL (ref 0.2–1.2)
Total Protein: 7.2 g/dL (ref 6.0–8.3)

## 2014-09-06 LAB — TSH: TSH: 2.41 u[IU]/mL (ref 0.35–4.50)

## 2014-09-06 LAB — LIPID PANEL
Cholesterol: 128 mg/dL (ref 0–200)
HDL: 35.9 mg/dL — ABNORMAL LOW
LDL Cholesterol: 75 mg/dL (ref 0–99)
NonHDL: 92.1
Total CHOL/HDL Ratio: 4
Triglycerides: 87 mg/dL (ref 0.0–149.0)
VLDL: 17.4 mg/dL (ref 0.0–40.0)

## 2014-09-06 LAB — BASIC METABOLIC PANEL WITH GFR
BUN: 20 mg/dL (ref 6–23)
CO2: 30 meq/L (ref 19–32)
Calcium: 9.6 mg/dL (ref 8.4–10.5)
Chloride: 100 meq/L (ref 96–112)
Creatinine, Ser: 1.06 mg/dL (ref 0.40–1.50)
GFR: 72.06 mL/min
Glucose, Bld: 77 mg/dL (ref 70–99)
Potassium: 4 meq/L (ref 3.5–5.1)
Sodium: 139 meq/L (ref 135–145)

## 2014-09-06 LAB — PSA, MEDICARE: PSA: 0.91 ng/mL (ref 0.10–4.00)

## 2014-09-06 MED ORDER — HYDROCHLOROTHIAZIDE 25 MG PO TABS: 25.0000 mg | ORAL_TABLET | Freq: Every day | ORAL | Status: DC

## 2014-09-06 MED ORDER — SIMVASTATIN 20 MG PO TABS: 20.0000 mg | ORAL_TABLET | Freq: Every evening | ORAL | Status: DC

## 2014-09-06 MED ORDER — METOPROLOL SUCCINATE ER 100 MG PO TB24: 100.0000 mg | ORAL_TABLET | Freq: Every day | ORAL | Status: DC

## 2014-09-06 NOTE — Progress Notes (Signed)
Pre visit review using our clinic review tool, if applicable. No additional management support is needed unless otherwise documented below in the visit note. 

## 2014-09-06 NOTE — Progress Notes (Signed)
Patient ID: Brett Riley, male   DOB: [birth year], 79 y.o.   MRN: [id number]   Subjective:    Patient ID: Brett Riley, male    DOB: 1935-12-08, 79 y.o.   MRN: [id number]  HPI  Patient here to follow up on his current medical issues as well as for a complete physical exam.  He is still working a lot of hours.  Tries to stay active.  No cardiac symptoms with increased activity or exertion.  No sob.  Eating and drinking well.  No nausea or vomiting.  No bowel change.     Past Medical History  Diagnosis Date  . Hypercholesterolemia   . Hypertension     Outpatient Encounter Prescriptions as of 09/06/2014  Medication Sig  . aspirin 81 MG tablet Take 81 mg by mouth daily.  . hydrochlorothiazide (HYDRODIURIL) 25 MG tablet Take 1 tablet (25 mg total) by mouth daily.  . metoprolol succinate (TOPROL-XL) 100 MG 24 hr tablet Take 1 tablet (100 mg total) by mouth daily. Take with or immediately following a meal.  . Multiple Vitamins-Iron (MULTIVITAMINS WITH IRON) TABS Take 1 tablet by mouth daily.  . simvastatin (ZOCOR) 20 MG tablet Take 1 tablet (20 mg total) by mouth every evening.  . [DISCONTINUED] hydrochlorothiazide (HYDRODIURIL) 25 MG tablet TAKE 1 TABLET EVERY DAY  . [DISCONTINUED] metoprolol succinate (TOPROL-XL) 100 MG 24 hr tablet TAKE 1 TABLET EVERY DAY  . [DISCONTINUED] simvastatin (ZOCOR) 20 MG tablet TAKE 1 TABLET EVERY EVENING   No facility-administered encounter medications on file as of 09/06/2014.    Review of Systems  Constitutional: Negative for appetite change and unexpected weight change.  HENT: Negative for congestion and sinus pressure.   Eyes: Negative for pain and visual disturbance.  Respiratory: Negative for cough, chest tightness and shortness of breath.   Cardiovascular: Negative for chest pain, palpitations and leg swelling.  Gastrointestinal: Negative for nausea, vomiting, abdominal pain and diarrhea.  Genitourinary: Negative for dysuria and difficulty  urinating.  Musculoskeletal: Negative for back pain and joint swelling.  Skin: Negative for color change and rash.  Neurological: Negative for dizziness, light-headedness and headaches.  Hematological: Negative for adenopathy. Does not bruise/bleed easily.  Psychiatric/Behavioral: Negative for dysphoric mood and agitation.       Objective:     Blood pressure recheck:  128/80  Physical Exam  Constitutional: He is oriented to person, place, and time. He appears well-developed and well-nourished. No distress.  HENT:  Head: Normocephalic and atraumatic.  Nose: Nose normal.  Mouth/Throat: Oropharynx is clear and moist.  Eyes: Conjunctivae are normal. Right eye exhibits no discharge. Left eye exhibits no discharge.  Neck: Neck supple. No thyromegaly present.  Cardiovascular: Normal rate and regular rhythm.   Pulmonary/Chest: Effort normal and breath sounds normal. No respiratory distress. He has no wheezes.  Abdominal: Soft. Bowel sounds are normal. There is no tenderness.  Genitourinary:  Prostate exam reveals no palpable prostate nodules.  Heme negative.    Musculoskeletal: He exhibits no edema or tenderness.  Lymphadenopathy:    He has no cervical adenopathy.  Neurological: He is alert and oriented to person, place, and time.  Skin: Skin is warm and dry. No rash noted. No erythema.  Psychiatric: He has a normal mood and affect. His behavior is normal.    BP 110/70 mmHg  Pulse 77  Temp(Src) 98.3 F (36.8 C) (Oral)  Ht 5\' 11"  (1.803 m)  Wt 209 lb 8 oz (95.029 kg)  BMI 29.23 kg/m2  SpO2  95% Wt Readings from Last 3 Encounters:  09/06/14 209 lb 8 oz (95.029 kg)  02/01/14 207 lb 4 oz (94.008 kg)  12/09/13 208 lb (94.348 kg)     Lab Results  Component Value Date   WBC 4.7 02/01/2014   HGB 14.8 02/01/2014   HCT 43.6 02/01/2014   PLT 165.0 02/01/2014   GLUCOSE 77 09/06/2014   CHOL 128 09/06/2014   TRIG 87.0 09/06/2014   HDL 35.90* 09/06/2014   LDLCALC 75 09/06/2014    ALT 19 09/06/2014   AST 20 09/06/2014   NA 139 09/06/2014   K 4.0 09/06/2014   CL 100 09/06/2014   CREATININE 1.06 09/06/2014   BUN 20 09/06/2014   CO2 30 09/06/2014   TSH 2.41 09/06/2014   PSA 0.91 09/06/2014        Assessment & Plan:   Problem List Items Addressed This Visit    Anemia    Hgb recently wnl.  Colonoscopy 08/12/13 normal.  Recommended f/u in 10 years.        Carotid stenosis    Carotid ultrasound as outlined.  Continues f/u with vascular.        Relevant Medications   hydrochlorothiazide (HYDRODIURIL) 25 MG tablet   metoprolol succinate (TOPROL-XL) 100 MG 24 hr tablet   simvastatin (ZOCOR) 20 MG tablet   Essential hypertension, benign - Primary    Blood pressure doing well.  Same medication regimen.  Follow pressures.        Relevant Medications   hydrochlorothiazide (HYDRODIURIL) 25 MG tablet   metoprolol succinate (TOPROL-XL) 100 MG 24 hr tablet   simvastatin (ZOCOR) 20 MG tablet   Other Relevant Orders   TSH (Completed)   Basic metabolic panel (Completed)   Health care maintenance    Physical today 09/06/14.  Check psa today.  Colonoscopy 08/12/13 - normal.        Hypercholesterolemia    On simvastatin. Low cholesterol diet and exercise.  Follow lipid panel and liver function tests.       Relevant Medications   hydrochlorothiazide (HYDRODIURIL) 25 MG tablet   metoprolol succinate (TOPROL-XL) 100 MG 24 hr tablet   simvastatin (ZOCOR) 20 MG tablet   Other Relevant Orders   Lipid panel (Completed)   Hepatic function panel (Completed)   Thrombocytopenia    Last check wnl.  Follow.         Other Visit Diagnoses    Prostate cancer screening        Relevant Orders    PSA, Medicare (Completed)        Einar Pheasant, MD

## 2014-09-07 ENCOUNTER — Encounter: Payer: Self-pay | Admitting: *Deleted

## 2014-09-11 ENCOUNTER — Encounter: Payer: Self-pay | Admitting: Internal Medicine

## 2014-09-11 DIAGNOSIS — Z0000 Encounter for general adult medical examination without abnormal findings: Secondary | ICD-10-CM | POA: Insufficient documentation

## 2014-09-11 NOTE — Assessment & Plan Note (Signed)
Hgb recently wnl.  Colonoscopy 08/12/13 normal.  Recommended f/u in 10 years.

## 2014-09-11 NOTE — Assessment & Plan Note (Signed)
On simvastatin.  Low cholesterol diet and exercise.  Follow lipid panel and liver function tests.   

## 2014-09-11 NOTE — Assessment & Plan Note (Signed)
Last check wnl.  Follow.   

## 2014-09-11 NOTE — Assessment & Plan Note (Signed)
Physical today 09/06/14.  Check psa today.  Colonoscopy 08/12/13 - normal.

## 2014-09-11 NOTE — Assessment & Plan Note (Signed)
Blood pressure doing well.  Same medication regimen.  Follow pressures.   

## 2014-09-11 NOTE — Assessment & Plan Note (Signed)
Carotid ultrasound as outlined.  Continues f/u with vascular.

## 2015-03-07 ENCOUNTER — Other Ambulatory Visit: Payer: Self-pay

## 2015-03-07 ENCOUNTER — Telehealth: Payer: Self-pay | Admitting: *Deleted

## 2015-03-07 MED ORDER — SIMVASTATIN 20 MG PO TABS: 20.0000 mg | ORAL_TABLET | Freq: Every evening | ORAL | Status: DC

## 2015-03-07 NOTE — Telephone Encounter (Signed)
Patient has requested a authorization for simvastatin, for [employer] mail delivery- [id number] Patient also requested a Rx for simvastatin to be sent to the [facility], due to [facility] mail delivery taking up to 10 days to deliver. Patient is currently out of medication

## 2015-03-08 ENCOUNTER — Other Ambulatory Visit: Payer: Self-pay

## 2015-03-08 MED ORDER — SIMVASTATIN 20 MG PO TABS: 20.0000 mg | ORAL_TABLET | Freq: Every evening | ORAL | Status: DC

## 2015-03-08 NOTE — Telephone Encounter (Signed)
Patient wife requested an update on progress for his request. Please advise

## 2015-03-08 NOTE — Telephone Encounter (Signed)
I sent both requests yesterday.  Have her check with the pharmacy. thanks

## 2015-03-09 ENCOUNTER — Ambulatory Visit (INDEPENDENT_AMBULATORY_CARE_PROVIDER_SITE_OTHER): Payer: Commercial Managed Care - HMO | Admitting: Internal Medicine

## 2015-03-09 ENCOUNTER — Encounter: Payer: Self-pay | Admitting: Internal Medicine

## 2015-03-09 VITALS — BP 120/80 | HR 80 | Temp 97.9°F | Resp 18 | Ht 71.0 in | Wt 212.0 lb

## 2015-03-09 DIAGNOSIS — I10 Essential (primary) hypertension: Secondary | ICD-10-CM

## 2015-03-09 DIAGNOSIS — D696 Thrombocytopenia, unspecified: Secondary | ICD-10-CM | POA: Diagnosis not present

## 2015-03-09 DIAGNOSIS — E7800 Pure hypercholesterolemia, unspecified: Secondary | ICD-10-CM | POA: Diagnosis not present

## 2015-03-09 DIAGNOSIS — I6529 Occlusion and stenosis of unspecified carotid artery: Secondary | ICD-10-CM

## 2015-03-09 DIAGNOSIS — Z23 Encounter for immunization: Secondary | ICD-10-CM | POA: Diagnosis not present

## 2015-03-09 LAB — LIPID PANEL
Cholesterol: 167 mg/dL (ref 0–200)
HDL: 38.1 mg/dL — ABNORMAL LOW
LDL Cholesterol: 103 mg/dL — ABNORMAL HIGH (ref 0–99)
NonHDL: 129.25
Total CHOL/HDL Ratio: 4
Triglycerides: 132 mg/dL (ref 0.0–149.0)
VLDL: 26.4 mg/dL (ref 0.0–40.0)

## 2015-03-09 LAB — CBC WITH DIFFERENTIAL/PLATELET
Basophils Absolute: 0 10*3/uL (ref 0.0–0.1)
Basophils Relative: 0.4 % (ref 0.0–3.0)
Eosinophils Absolute: 0.2 10*3/uL (ref 0.0–0.7)
Eosinophils Relative: 2.9 % (ref 0.0–5.0)
HCT: 46.1 % (ref 39.0–52.0)
Hemoglobin: 15.6 g/dL (ref 13.0–17.0)
Lymphocytes Relative: 25.2 % (ref 12.0–46.0)
Lymphs Abs: 1.9 10*3/uL (ref 0.7–4.0)
MCHC: 33.9 g/dL (ref 30.0–36.0)
MCV: 96.8 fl (ref 78.0–100.0)
Monocytes Absolute: 0.6 10*3/uL (ref 0.1–1.0)
Monocytes Relative: 8.2 % (ref 3.0–12.0)
Neutro Abs: 4.7 10*3/uL (ref 1.4–7.7)
Neutrophils Relative %: 63.3 % (ref 43.0–77.0)
Platelets: 160 10*3/uL (ref 150.0–400.0)
RBC: 4.77 Mil/uL (ref 4.22–5.81)
RDW: 13.7 % (ref 11.5–15.5)
WBC: 7.5 10*3/uL (ref 4.0–10.5)

## 2015-03-09 LAB — BASIC METABOLIC PANEL WITH GFR
BUN: 21 mg/dL (ref 6–23)
CO2: 29 meq/L (ref 19–32)
Calcium: 10 mg/dL (ref 8.4–10.5)
Chloride: 102 meq/L (ref 96–112)
Creatinine, Ser: 1.01 mg/dL (ref 0.40–1.50)
GFR: 76.09 mL/min
Glucose, Bld: 100 mg/dL — ABNORMAL HIGH (ref 70–99)
Potassium: 4.3 meq/L (ref 3.5–5.1)
Sodium: 139 meq/L (ref 135–145)

## 2015-03-09 LAB — HEPATIC FUNCTION PANEL
ALT: 20 U/L (ref 0–53)
AST: 21 U/L (ref 0–37)
Albumin: 4.5 g/dL (ref 3.5–5.2)
Alkaline Phosphatase: 75 U/L (ref 39–117)
Bilirubin, Direct: 0.1 mg/dL (ref 0.0–0.3)
Total Bilirubin: 0.7 mg/dL (ref 0.2–1.2)
Total Protein: 7.4 g/dL (ref 6.0–8.3)

## 2015-03-09 MED ORDER — SIMVASTATIN 20 MG PO TABS: 20.0000 mg | ORAL_TABLET | Freq: Every evening | ORAL | Status: DC

## 2015-03-09 NOTE — Progress Notes (Signed)
Pre-visit discussion using our clinic review tool. No additional management support is needed unless otherwise documented below in the visit note.  

## 2015-03-09 NOTE — Assessment & Plan Note (Signed)
Blood pressure under good control.  Continue same medication regimen.  Follow pressures.  Follow metabolic panel.   

## 2015-03-09 NOTE — Assessment & Plan Note (Signed)
Carotid ultrasound reviewed and outlined in overview.  Due f/u with vascular surgery in 2-3/17.  appt already scheduled.

## 2015-03-09 NOTE — Addendum Note (Signed)
Addended by: Karlene Einstein D on: 03/09/2015 08:34 AM   Modules accepted: Orders

## 2015-03-09 NOTE — Assessment & Plan Note (Signed)
Recheck cbc today.   

## 2015-03-09 NOTE — Assessment & Plan Note (Signed)
On simvastatin.  Ran out several days ago.  Otherwise has been taking regularly.  Check lipid panel and liver function tests.

## 2015-03-09 NOTE — Progress Notes (Signed)
Patient ID: Brett Riley, male   DOB: [birth year], 80 y.o.   MRN: [id number]   Subjective:    Patient ID: Brett Riley, male    DOB: 10-03-1935, 80 y.o.   MRN: [id number]  HPI  Patient with past history of hypercholesterolemia and hypertension who comes in today to follow up on these issues.  He feels good.  Stays active.  No cardiac symptoms with increased activity or exertion.  No sob.  No abdominal pain or cramping.  Bowels stable.  Ran out of cholesterol medication 5-6 days ago.  Taking his other medications regularly.     Past Medical History  Diagnosis Date  . Hypercholesterolemia   . Hypertension    Past Surgical History  Procedure Laterality Date  . No past surgeries     Family History  Problem Relation Age of Onset  . Heart disease Father     MI  . Stroke Mother   . Breast cancer Sister   . Colon cancer Neg Hx   . Prostate cancer Neg Hx    Social History   Social History  . Marital Status: Married    Spouse Name: N/A  . Number of Children: 1  . Years of Education: N/A   Occupational History  .     Social History Main Topics  . Smoking status: Never Smoker   . Smokeless tobacco: Never Used  . Alcohol Use: No  . Drug Use: No  . Sexual Activity: Not Asked   Other Topics Concern  . None   Social History Narrative    Outpatient Encounter Prescriptions as of 03/09/2015  Medication Sig  . aspirin 81 MG tablet Take 81 mg by mouth daily.  . hydrochlorothiazide (HYDRODIURIL) 25 MG tablet Take 1 tablet (25 mg total) by mouth daily.  . metoprolol succinate (TOPROL-XL) 100 MG 24 hr tablet Take 1 tablet (100 mg total) by mouth daily. Take with or immediately following a meal.  . Multiple Vitamins-Iron (MULTIVITAMINS WITH IRON) TABS Take 1 tablet by mouth daily.  . simvastatin (ZOCOR) 20 MG tablet Take 1 tablet (20 mg total) by mouth every evening.  . [DISCONTINUED] simvastatin (ZOCOR) 20 MG tablet Take 1 tablet (20 mg total) by mouth every evening.   No  facility-administered encounter medications on file as of 03/09/2015.    Review of Systems  Constitutional: Negative for fever and appetite change.  HENT: Negative for congestion and sinus pressure.   Respiratory: Negative for cough, chest tightness and shortness of breath.   Cardiovascular: Negative for chest pain, palpitations and leg swelling.  Gastrointestinal: Negative for nausea, vomiting, abdominal pain and diarrhea.  Musculoskeletal: Negative for back pain and joint swelling.  Neurological: Negative for dizziness, light-headedness and headaches.  Psychiatric/Behavioral: Negative for dysphoric mood and agitation.       Objective:     Blood pressure rechecked by me:  132/78  Physical Exam  Constitutional: He appears well-developed and well-nourished. No distress.  HENT:  Nose: Nose normal.  Mouth/Throat: Oropharynx is clear and moist.  Neck: Neck supple. No thyromegaly present.  Cardiovascular: Normal rate and regular rhythm.   Pulmonary/Chest: Effort normal and breath sounds normal. No respiratory distress.  Abdominal: Soft. Bowel sounds are normal. There is no tenderness.  Musculoskeletal: He exhibits no edema or tenderness.  Lymphadenopathy:    He has no cervical adenopathy.  Psychiatric: He has a normal mood and affect. His behavior is normal.    BP 120/80 mmHg  Pulse 80  Temp(Src) 97.9 F (  36.6 C) (Oral)  Resp 18  Ht 5\' 11"  (1.803 m)  Wt 212 lb (96.163 kg)  BMI 29.58 kg/m2  SpO2 93% Wt Readings from Last 3 Encounters:  03/09/15 212 lb (96.163 kg)  09/06/14 209 lb 8 oz (95.029 kg)  02/01/14 207 lb 4 oz (94.008 kg)     Lab Results  Component Value Date   WBC 4.7 02/01/2014   HGB 14.8 02/01/2014   HCT 43.6 02/01/2014   PLT 165.0 02/01/2014   GLUCOSE 77 09/06/2014   CHOL 128 09/06/2014   TRIG 87.0 09/06/2014   HDL 35.90* 09/06/2014   LDLCALC 75 09/06/2014   ALT 19 09/06/2014   AST 20 09/06/2014   NA 139 09/06/2014   K 4.0 09/06/2014   CL 100  09/06/2014   CREATININE 1.06 09/06/2014   BUN 20 09/06/2014   CO2 30 09/06/2014   TSH 2.41 09/06/2014   PSA 0.91 09/06/2014       Assessment & Plan:   Problem List Items Addressed This Visit    Carotid stenosis    Carotid ultrasound reviewed and outlined in overview.  Due f/u with vascular surgery in 2-3/17.  appt already scheduled.        Relevant Medications   simvastatin (ZOCOR) 20 MG tablet   Essential hypertension, benign - Primary    Blood pressure under good control.  Continue same medication regimen.  Follow pressures.  Follow metabolic panel.        Relevant Medications   simvastatin (ZOCOR) 20 MG tablet   Other Relevant Orders   Basic metabolic panel   Hypercholesterolemia    On simvastatin.  Ran out several days ago.  Otherwise has been taking regularly.  Check lipid panel and liver function tests.        Relevant Medications   simvastatin (ZOCOR) 20 MG tablet   Other Relevant Orders   Lipid panel   Hepatic function panel   Thrombocytopenia (HCC)    Recheck cbc today.        Relevant Orders   CBC with Differential/Platelet       Einar Pheasant, MD

## 2015-03-09 NOTE — Patient Instructions (Signed)
Influenza Virus Vaccine injection (Fluarix) What is this medicine? INFLUENZA VIRUS VACCINE (in floo EN zuh VAHY ruhs vak SEEN) helps to reduce the risk of getting influenza also known as the flu. This medicine may be used for other purposes; ask your health care provider or pharmacist if you have questions. What should I tell my health care provider before I take this medicine? They need to know if you have any of these conditions: -bleeding disorder like hemophilia -fever or infection -Guillain-Barre syndrome or other neurological problems -immune system problems -infection with the human immunodeficiency virus (HIV) or AIDS -low blood platelet counts -multiple sclerosis -an unusual or allergic reaction to influenza virus vaccine, eggs, chicken proteins, latex, gentamicin, other medicines, foods, dyes or preservatives -pregnant or trying to get pregnant -breast-feeding How should I use this medicine? This vaccine is for injection into a muscle. It is given by a health care professional. A copy of Vaccine Information Statements will be given before each vaccination. Read this sheet carefully each time. The sheet may change frequently. Talk to your pediatrician regarding the use of this medicine in children. Special care may be needed. Overdosage: If you think you have taken too much of this medicine contact a poison control center or emergency room at once. NOTE: This medicine is only for you. Do not share this medicine with others. What if I miss a dose? This does not apply. What may interact with this medicine? -chemotherapy or radiation therapy -medicines that lower your immune system like etanercept, anakinra, infliximab, and adalimumab -medicines that treat or prevent blood clots like warfarin -phenytoin -steroid medicines like prednisone or cortisone -theophylline -vaccines This list may not describe all possible interactions. Give your health care provider a list of all the  medicines, herbs, non-prescription drugs, or dietary supplements you use. Also tell them if you smoke, drink alcohol, or use illegal drugs. Some items may interact with your medicine. What should I watch for while using this medicine? Report any side effects that do not go away within 3 days to your doctor or health care professional. Call your health care provider if any unusual symptoms occur within 6 weeks of receiving this vaccine. You may still catch the flu, but the illness is not usually as bad. You cannot get the flu from the vaccine. The vaccine will not protect against colds or other illnesses that may cause fever. The vaccine is needed every year. What side effects may I notice from receiving this medicine? Side effects that you should report to your doctor or health care professional as soon as possible: -allergic reactions like skin rash, itching or hives, swelling of the face, lips, or tongue Side effects that usually do not require medical attention (report to your doctor or health care professional if they continue or are bothersome): -fever -headache -muscle aches and pains -pain, tenderness, redness, or swelling at site where injected -weak or tired This list may not describe all possible side effects. Call your doctor for medical advice about side effects. You may report side effects to FDA at 1-800-FDA-1088. Where should I keep my medicine? This vaccine is only given in a clinic, pharmacy, doctor's office, or other health care setting and will not be stored at home. NOTE: This sheet is a summary. It may not cover all possible information. If you have questions about this medicine, talk to your doctor, pharmacist, or health care provider.    2016, Elsevier/Gold Standard. (2007-09-16 09:30:40)  

## 2015-03-10 ENCOUNTER — Encounter: Payer: Self-pay | Admitting: *Deleted

## 2015-03-16 ENCOUNTER — Telehealth: Payer: Self-pay | Admitting: Internal Medicine

## 2015-03-16 NOTE — Telephone Encounter (Signed)
Left msg to call office to schedule AWV/msn °

## 2015-05-16 ENCOUNTER — Telehealth: Payer: Self-pay

## 2015-05-16 DIAGNOSIS — H6120 Impacted cerumen, unspecified ear: Secondary | ICD-10-CM

## 2015-05-16 NOTE — Telephone Encounter (Signed)
Pt's wife states that Brett Riley needs a referral to ENT to have his ears cleaned out. He would like to see Dr. Charolett Bumpers at [facility] in [place]. Please advise, thanks

## 2015-05-17 NOTE — Telephone Encounter (Signed)
Order placed for ENT referral.  See initial phone message.  Thanks

## 2015-05-22 ENCOUNTER — Other Ambulatory Visit: Payer: Self-pay | Admitting: Internal Medicine

## 2015-09-07 ENCOUNTER — Encounter: Payer: Self-pay | Admitting: Internal Medicine

## 2015-09-07 ENCOUNTER — Ambulatory Visit (INDEPENDENT_AMBULATORY_CARE_PROVIDER_SITE_OTHER): Payer: Commercial Managed Care - HMO | Admitting: Internal Medicine

## 2015-09-07 VITALS — BP 110/80 | HR 75 | Temp 98.2°F | Resp 18 | Ht 71.0 in | Wt 206.0 lb

## 2015-09-07 DIAGNOSIS — Z125 Encounter for screening for malignant neoplasm of prostate: Secondary | ICD-10-CM | POA: Diagnosis not present

## 2015-09-07 DIAGNOSIS — I6529 Occlusion and stenosis of unspecified carotid artery: Secondary | ICD-10-CM

## 2015-09-07 DIAGNOSIS — E7800 Pure hypercholesterolemia, unspecified: Secondary | ICD-10-CM

## 2015-09-07 DIAGNOSIS — D696 Thrombocytopenia, unspecified: Secondary | ICD-10-CM | POA: Diagnosis not present

## 2015-09-07 DIAGNOSIS — I10 Essential (primary) hypertension: Secondary | ICD-10-CM | POA: Diagnosis not present

## 2015-09-07 DIAGNOSIS — Z0000 Encounter for general adult medical examination without abnormal findings: Secondary | ICD-10-CM | POA: Diagnosis not present

## 2015-09-07 LAB — BASIC METABOLIC PANEL WITH GFR
BUN: 18 mg/dL (ref 6–23)
CO2: 30 meq/L (ref 19–32)
Calcium: 10.1 mg/dL (ref 8.4–10.5)
Chloride: 102 meq/L (ref 96–112)
Creatinine, Ser: 0.96 mg/dL (ref 0.40–1.50)
GFR: 80.58 mL/min
Glucose, Bld: 100 mg/dL — ABNORMAL HIGH (ref 70–99)
Potassium: 3.9 meq/L (ref 3.5–5.1)
Sodium: 139 meq/L (ref 135–145)

## 2015-09-07 LAB — CBC WITH DIFFERENTIAL/PLATELET
Basophils Absolute: 0 10*3/uL (ref 0.0–0.1)
Basophils Relative: 0.5 % (ref 0.0–3.0)
Eosinophils Absolute: 0.2 10*3/uL (ref 0.0–0.7)
Eosinophils Relative: 4.2 % (ref 0.0–5.0)
HCT: 41.6 % (ref 39.0–52.0)
Hemoglobin: 14.3 g/dL (ref 13.0–17.0)
Lymphocytes Relative: 37.5 % (ref 12.0–46.0)
Lymphs Abs: 1.8 10*3/uL (ref 0.7–4.0)
MCHC: 34.5 g/dL (ref 30.0–36.0)
MCV: 94.7 fl (ref 78.0–100.0)
Monocytes Absolute: 0.4 10*3/uL (ref 0.1–1.0)
Monocytes Relative: 8.5 % (ref 3.0–12.0)
Neutro Abs: 2.3 10*3/uL (ref 1.4–7.7)
Neutrophils Relative %: 49.3 % (ref 43.0–77.0)
Platelets: 165 10*3/uL (ref 150.0–400.0)
RBC: 4.39 Mil/uL (ref 4.22–5.81)
RDW: 14.3 % (ref 11.5–15.5)
WBC: 4.7 10*3/uL (ref 4.0–10.5)

## 2015-09-07 LAB — LIPID PANEL
Cholesterol: 141 mg/dL (ref 0–200)
HDL: 41.4 mg/dL
LDL Cholesterol: 82 mg/dL (ref 0–99)
NonHDL: 99.36
Total CHOL/HDL Ratio: 3
Triglycerides: 88 mg/dL (ref 0.0–149.0)
VLDL: 17.6 mg/dL (ref 0.0–40.0)

## 2015-09-07 LAB — HEPATIC FUNCTION PANEL
ALT: 18 U/L (ref 0–53)
AST: 22 U/L (ref 0–37)
Albumin: 4.6 g/dL (ref 3.5–5.2)
Alkaline Phosphatase: 74 U/L (ref 39–117)
Bilirubin, Direct: 0.2 mg/dL (ref 0.0–0.3)
Total Bilirubin: 1 mg/dL (ref 0.2–1.2)
Total Protein: 7.3 g/dL (ref 6.0–8.3)

## 2015-09-07 LAB — PSA, MEDICARE: PSA: 1 ng/mL (ref 0.10–4.00)

## 2015-09-07 NOTE — Progress Notes (Signed)
Pre-visit discussion using our clinic review tool. No additional management support is needed unless otherwise documented below in the visit note.  

## 2015-09-07 NOTE — Progress Notes (Signed)
Patient ID: Brett Riley, male   DOB: Apr 18, 1935, 80 y.o.   MRN: [id number]   Subjective:    Patient ID: Brett Riley, male    DOB: 01/16/1936, 80 y.o.   MRN: [id number]  HPI  Patient here for his physical exam.  States he is doing well.  Feels good.  Stays active.  No cardiac symptoms with increased activity or exertion.  No sob.  No acid reflux.  No abdominal pain or cramping.  Bowels stable.  No urine change.  Has seen vascular surgery.  Stable.  Recommended f/u q 6 months.  Works a lot.     Past Medical History  Diagnosis Date  . Hypercholesterolemia   . Hypertension    Past Surgical History  Procedure Laterality Date  . No past surgeries     Family History  Problem Relation Age of Onset  . Heart disease Father     MI  . Stroke Mother   . Breast cancer Sister   . Colon cancer Neg Hx   . Prostate cancer Neg Hx    Social History   Social History  . Marital Status: Married    Spouse Name: N/A  . Number of Children: 1  . Years of Education: N/A   Occupational History  .     Social History Main Topics  . Smoking status: Never Smoker   . Smokeless tobacco: Never Used  . Alcohol Use: No  . Drug Use: No  . Sexual Activity: Not Asked   Other Topics Concern  . None   Social History Narrative    Outpatient Encounter Prescriptions as of 09/07/2015  Medication Sig  . aspirin 81 MG tablet Take 81 mg by mouth daily.  . hydrochlorothiazide (HYDRODIURIL) 25 MG tablet Take 1 tablet (25 mg total) by mouth daily.  . metoprolol succinate (TOPROL-XL) 100 MG 24 hr tablet TAKE 1 TABLET EVERY DAY WITH A MEAL  . Multiple Vitamins-Iron (MULTIVITAMINS WITH IRON) TABS Take 1 tablet by mouth daily.  . simvastatin (ZOCOR) 20 MG tablet Take 1 tablet (20 mg total) by mouth every evening.  . [DISCONTINUED] hydrochlorothiazide (HYDRODIURIL) 25 MG tablet TAKE 1 TABLET EVERY DAY  . [DISCONTINUED] metoprolol succinate (TOPROL-XL) 100 MG 24 hr tablet TAKE 1 TABLET EVERY DAY WITH A MEAL  .  [DISCONTINUED] simvastatin (ZOCOR) 20 MG tablet Take 1 tablet (20 mg total) by mouth every evening.   No facility-administered encounter medications on file as of 09/07/2015.    Review of Systems  Constitutional: Negative for appetite change and unexpected weight change.  HENT: Negative for congestion and sinus pressure.   Eyes: Negative for pain and visual disturbance.  Respiratory: Negative for cough, chest tightness and shortness of breath.   Cardiovascular: Negative for chest pain, palpitations and leg swelling.  Gastrointestinal: Negative for nausea, vomiting, abdominal pain and diarrhea.  Genitourinary: Negative for dysuria and difficulty urinating.  Musculoskeletal: Negative for back pain and joint swelling.  Skin: Negative for color change and rash.  Neurological: Negative for dizziness, light-headedness and headaches.  Hematological: Negative for adenopathy. Does not bruise/bleed easily.  Psychiatric/Behavioral: Negative for dysphoric mood and agitation.       Objective:    Physical Exam  Constitutional: He is oriented to person, place, and time. He appears well-developed and well-nourished. No distress.  HENT:  Head: Normocephalic and atraumatic.  Nose: Nose normal.  Mouth/Throat: Oropharynx is clear and moist. No oropharyngeal exudate.  Eyes: Conjunctivae are normal. Right eye exhibits no discharge. Left eye  exhibits no discharge.  Neck: Neck supple. No thyromegaly present.  Cardiovascular: Normal rate and regular rhythm.   Pulmonary/Chest: Breath sounds normal. No respiratory distress. He has no wheezes.  Abdominal: Soft. Bowel sounds are normal. There is no tenderness.  Genitourinary:  Rectal exam - no palpable prostate nodules.  Heme negative.    Musculoskeletal: He exhibits no edema or tenderness.  Lymphadenopathy:    He has no cervical adenopathy.  Neurological: He is alert and oriented to person, place, and time.  Skin: Skin is warm and dry. No rash noted. No  erythema.  Psychiatric: He has a normal mood and affect. His behavior is normal.    BP 110/80 mmHg  Pulse 75  Temp(Src) 98.2 F (36.8 C) (Oral)  Resp 18  Ht 5\' 11"  (1.803 m)  Wt 206 lb (93.441 kg)  BMI 28.74 kg/m2  SpO2 97% Wt Readings from Last 3 Encounters:  09/07/15 206 lb (93.441 kg)  03/09/15 212 lb (96.163 kg)  09/06/14 209 lb 8 oz (95.029 kg)     Lab Results  Component Value Date   WBC 4.7 09/07/2015   HGB 14.3 09/07/2015   HCT 41.6 09/07/2015   PLT 165.0 09/07/2015   GLUCOSE 100* 09/07/2015   CHOL 141 09/07/2015   TRIG 88.0 09/07/2015   HDL 41.40 09/07/2015   LDLCALC 82 09/07/2015   ALT 18 09/07/2015   AST 22 09/07/2015   NA 139 09/07/2015   K 3.9 09/07/2015   CL 102 09/07/2015   CREATININE 0.96 09/07/2015   BUN 18 09/07/2015   CO2 30 09/07/2015   TSH 2.41 09/06/2014   PSA 1.00 09/07/2015       Assessment & Plan:   Problem List Items Addressed This Visit    Carotid stenosis    Seeing vascular surgery.  Stable.  Seeing him q 6 months.        Relevant Medications   metoprolol succinate (TOPROL-XL) 100 MG 24 hr tablet   hydrochlorothiazide (HYDRODIURIL) 25 MG tablet   simvastatin (ZOCOR) 20 MG tablet   Essential hypertension, benign - Primary    Blood pressure under good control.  Continue same medication regimen.  Follow pressures.  Follow metabolic panel.        Relevant Medications   metoprolol succinate (TOPROL-XL) 100 MG 24 hr tablet   hydrochlorothiazide (HYDRODIURIL) 25 MG tablet   simvastatin (ZOCOR) 20 MG tablet   Other Relevant Orders   Basic metabolic panel (Completed)   Health care maintenance    Physical today 09/07/15.  Check psa.  Colonoscopy 08/12/13 - normal.        Hypercholesterolemia    On simvastatin.  Low cholesterol diet and exercise.  Follow lipid panel and liver function tests.        Relevant Medications   metoprolol succinate (TOPROL-XL) 100 MG 24 hr tablet   hydrochlorothiazide (HYDRODIURIL) 25 MG tablet    simvastatin (ZOCOR) 20 MG tablet   Other Relevant Orders   Lipid panel (Completed)   Hepatic function panel (Completed)   Thrombocytopenia (HCC)    Last platelet count wnl.  Recheck cbc.       Relevant Orders   CBC with Differential/Platelet (Completed)    Other Visit Diagnoses    Prostate cancer screening        Relevant Orders    PSA, Medicare (Completed)        Einar Pheasant, MD

## 2015-09-08 ENCOUNTER — Encounter: Payer: Self-pay | Admitting: *Deleted

## 2015-09-10 ENCOUNTER — Encounter: Payer: Self-pay | Admitting: Internal Medicine

## 2015-09-10 MED ORDER — SIMVASTATIN 20 MG PO TABS: 20.0000 mg | ORAL_TABLET | Freq: Every evening | ORAL | Status: DC

## 2015-09-10 MED ORDER — METOPROLOL SUCCINATE ER 100 MG PO TB24: ORAL_TABLET | ORAL | Status: DC

## 2015-09-10 MED ORDER — HYDROCHLOROTHIAZIDE 25 MG PO TABS: 25.0000 mg | ORAL_TABLET | Freq: Every day | ORAL | Status: DC

## 2015-09-10 NOTE — Assessment & Plan Note (Signed)
Physical today 09/07/15.  Check psa.  Colonoscopy 08/12/13 - normal.

## 2015-09-10 NOTE — Assessment & Plan Note (Signed)
On simvastatin.  Low cholesterol diet and exercise.  Follow lipid panel and liver function tests.   

## 2015-09-10 NOTE — Assessment & Plan Note (Signed)
Seeing vascular surgery.  Stable.  Seeing him q 6 months.

## 2015-09-10 NOTE — Assessment & Plan Note (Signed)
Blood pressure under good control.  Continue same medication regimen.  Follow pressures.  Follow metabolic panel.   

## 2015-09-10 NOTE — Assessment & Plan Note (Signed)
Last platelet count wnl.  Recheck cbc.   

## 2015-10-04 ENCOUNTER — Other Ambulatory Visit: Payer: Self-pay | Admitting: Vascular Surgery

## 2015-10-04 DIAGNOSIS — I6523 Occlusion and stenosis of bilateral carotid arteries: Secondary | ICD-10-CM

## 2015-10-10 ENCOUNTER — Ambulatory Visit: Payer: Commercial Managed Care - HMO

## 2015-10-11 ENCOUNTER — Ambulatory Visit: Payer: Commercial Managed Care - HMO

## 2016-01-04 ENCOUNTER — Ambulatory Visit
Admission: RE | Admit: 2016-01-04 | Discharge: 2016-01-04 | Disposition: A | Discharge status: Home / Self Care | Payer: Commercial Managed Care - HMO | Source: Ambulatory Visit | Attending: Vascular Surgery | Admitting: Vascular Surgery

## 2016-01-04 DIAGNOSIS — I6523 Occlusion and stenosis of bilateral carotid arteries: Secondary | ICD-10-CM | POA: Insufficient documentation

## 2016-01-04 DIAGNOSIS — I700 Atherosclerosis of aorta: Secondary | ICD-10-CM | POA: Insufficient documentation

## 2016-01-04 DIAGNOSIS — I6503 Occlusion and stenosis of bilateral vertebral arteries: Secondary | ICD-10-CM | POA: Diagnosis not present

## 2016-01-04 LAB — POCT I-STAT CREATININE: Creatinine, Ser: 1.1 mg/dL (ref 0.61–1.24)

## 2016-01-04 IMAGING — CT CT ANGIO NECK
2 of 3 series · 8 of 14 positions shown · IV contrast (APPLIED)
Comparison: None.

CLINICAL DATA: Abnormal noninvasive carotid study with carotid
stenosis detected.

EXAM:
CT ANGIOGRAPHY NECK
TECHNIQUE: Multidetector CT imaging of the neck was performed using the
standard protocol during bolus administration of intravenous
contrast. Multiplanar CT image reconstructions and MIPs were
obtained to evaluate the vascular anatomy. Carotid stenosis
measurements (when applicable) are obtained utilizing NASCET
criteria, using the distal internal carotid diameter as the
denominator.
CONTRAST:  75 cc Isovue 370

[Series 4: cta neck · axial · 0.42mm/px · z∈[-264,-164]mm · 2 of 151 slices shown]
[im 51/151  soft-tissue]
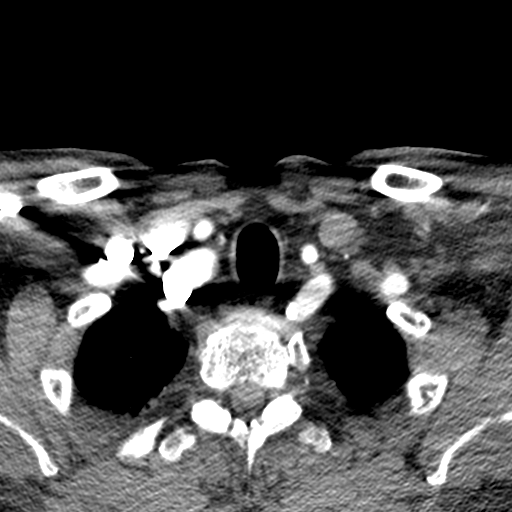
[im 101/151  soft-tissue]
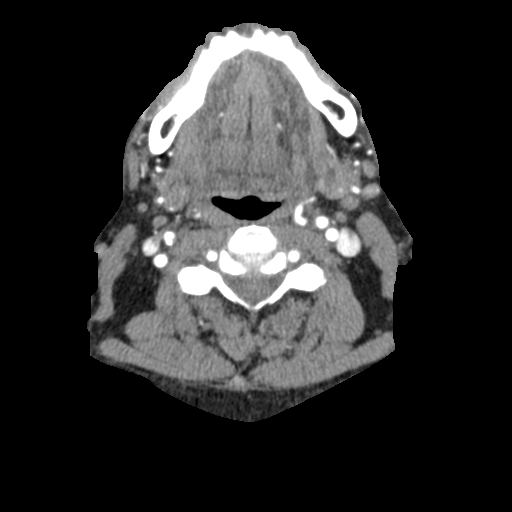

[Series 6: ax thin · axial · 0.39mm/px · z∈[-321,-106]mm · 6 of 303 slices shown]
[im 44/303  soft-tissue]
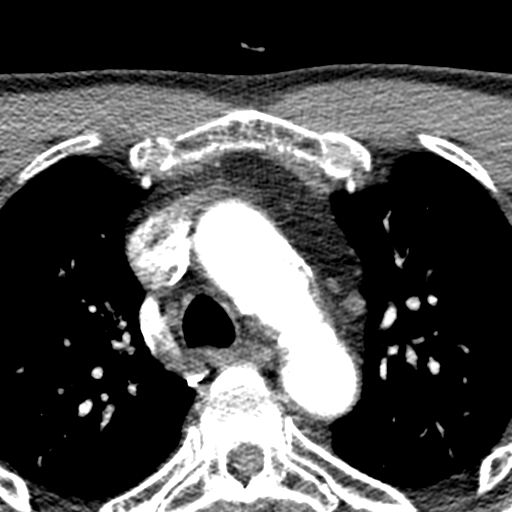
[im 87/303  bone]
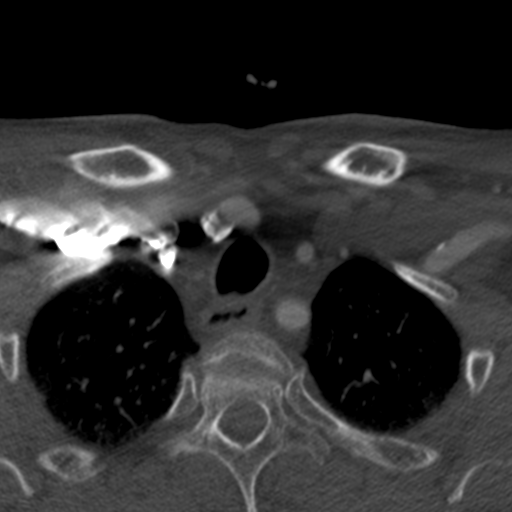
[im 130/303  soft-tissue]
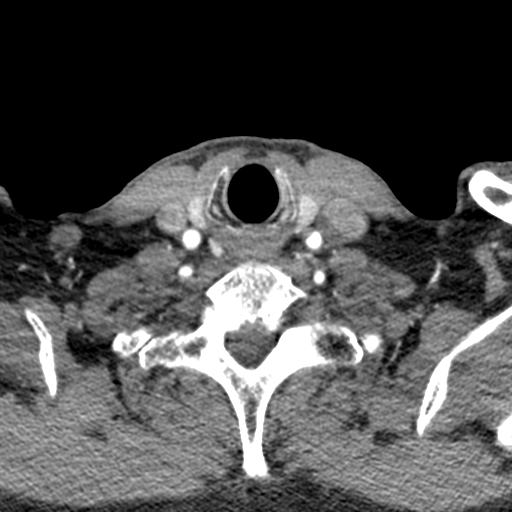
[im 173/303  bone]
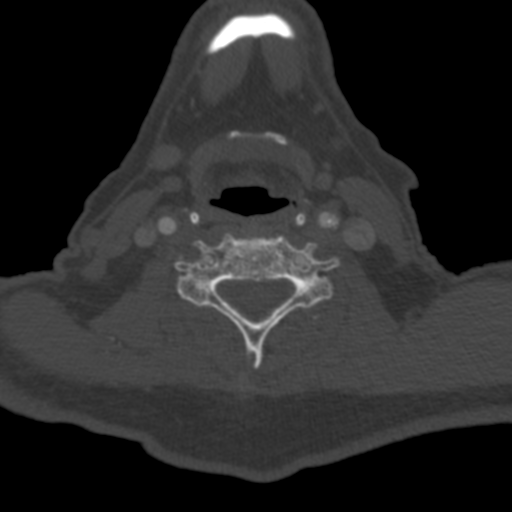
[im 216/303  soft-tissue]
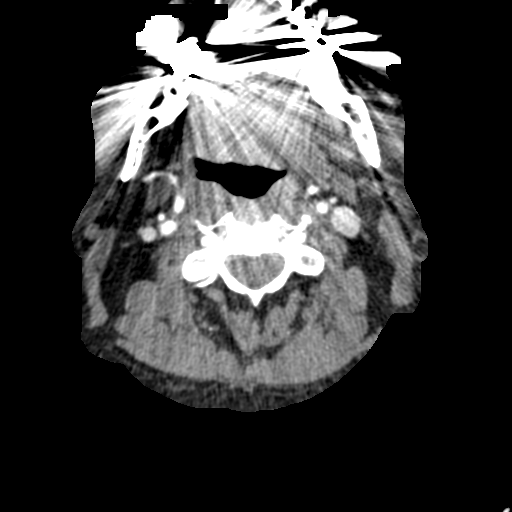
[im 259/303  bone]
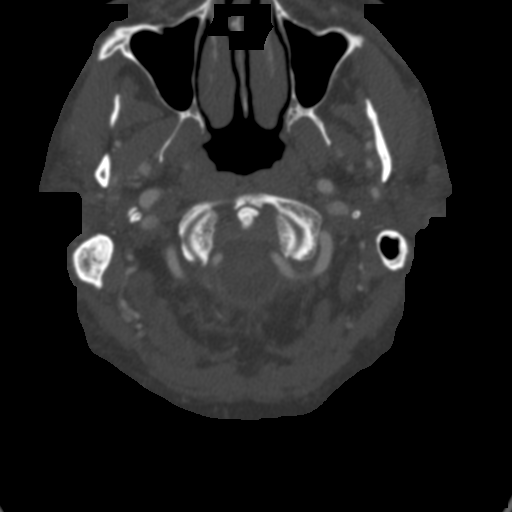

[8 of 14 positions shown; findings below may reference images not displayed]

FINDINGS: Aortic arch: Aortic atherosclerosis. Branching pattern of the
brachiocephalic vessels is normal with a common origin of the
innominate artery and common carotid artery. No flow limiting origin
stenosis.

Right carotid system: Common carotid artery is widely patent. There
is some common carotid plaque but no stenosis greater than 20%.
There is extensive atherosclerotic disease at the right carotid
bifurcation. Minimal diameter of the proximal ICA is 4.8 mm.
Compared to a more distal cervical ICA diameter of the same, there
is no stenosis.

Left carotid system: Common carotid artery shows some plaque but no
stenosis greater than 20%. There is extensive atherosclerotic
disease at the left carotid bifurcation with severe stenosis of the
proximal ICA in the bulb. Diameter is 1 mm or less. Compared to a
more distal cervical ICA diameter of 5 mm, this indicates an 80%
stenosis.

Vertebral arteries: 50% stenosis at the right subclavian artery
origin from the innominate. Bulky calcified plaque at the right
vertebral artery origin with stenosis of 80% or greater. The vessel
is patent beyond that, with atherosclerotic irregularity and
narrowing throughout the cervical region with serial stenosis
approximating 50%. Right vertebral artery does give supply to the
basilar. There is atherosclerotic plaque at the left vertebral
artery origin with stenosis estimated at 70%. Beyond that, the left
vertebral artery is widely patent to the basilar.

Skeleton: Ordinary spondylosis.

Other neck: No mass or lymphadenopathy.

Upper chest: Negative
IMPRESSION: Aortic atherosclerosis.

Atherosclerotic disease at the right carotid bifurcation but without
measurable stenosis.

Advanced atherosclerotic disease at the left carotid bifurcation and
proximal ICA. Minimal diameter in the ICA bulb is 1 mm or less,
consistent with 80% or greater stenosis.

Bilateral vertebral artery origins stenoses, 80% or greater on the
right and 70% or greater on the left. There are also serial stenoses
within the cervical vertebral artery on the right.

## 2016-01-04 MED ORDER — IOPAMIDOL (ISOVUE-370) INJECTION 76%
75.0000 mL | Freq: Once | INTRAVENOUS | Status: AC | PRN
  Administered 2016-01-04: 75 mL via INTRAVENOUS

## 2016-01-05 ENCOUNTER — Encounter (INDEPENDENT_AMBULATORY_CARE_PROVIDER_SITE_OTHER): Payer: Self-pay | Admitting: Vascular Surgery

## 2016-01-05 ENCOUNTER — Ambulatory Visit (INDEPENDENT_AMBULATORY_CARE_PROVIDER_SITE_OTHER): Payer: Commercial Managed Care - HMO | Admitting: Vascular Surgery

## 2016-01-05 VITALS — BP 152/82 | HR 72 | Resp 16 | Ht 72.0 in | Wt 207.0 lb

## 2016-01-05 DIAGNOSIS — I6523 Occlusion and stenosis of bilateral carotid arteries: Secondary | ICD-10-CM | POA: Diagnosis not present

## 2016-01-05 DIAGNOSIS — E7800 Pure hypercholesterolemia, unspecified: Secondary | ICD-10-CM | POA: Diagnosis not present

## 2016-01-05 DIAGNOSIS — I10 Essential (primary) hypertension: Secondary | ICD-10-CM

## 2016-01-05 NOTE — Assessment & Plan Note (Signed)
I have independently reviewed the patient's CT angiogram. This demonstrates an approximately 80% left ICA stenosis and about a 20% right ICA stenosis. At 80%, the patient would clearly benefit from left carotid endarterectomy for stroke risk reduction given his reasonably good risk profile. I discussed the risks and benefits of the procedure. The patient voices his understanding and is agreeable to proceed.

## 2016-01-05 NOTE — Progress Notes (Signed)
MRN : [id number]  Brett Riley is a 80 y.o. (1935-07-04) male who presents with chief complaint of  Chief Complaint  Patient presents with  . Re-evaluation    CT results  .  History of Present Illness: Patient returns today in follow up of his carotid disease. He has had no focal neurologic symptoms. Specifically, the patient denies amaurosis fugax, speech or swallowing difficulties, or arm or leg weakness or numbness I have independently reviewed his CT angiogram. He has about a 20% right ICA stenosis and an 80% left ICA stenosis.  Current Outpatient Prescriptions  Medication Sig Dispense Refill  . aspirin 81 MG tablet Take 81 mg by mouth daily.    . hydrochlorothiazide (HYDRODIURIL) 25 MG tablet Take 1 tablet (25 mg total) by mouth daily. 90 tablet 3  . metoprolol succinate (TOPROL-XL) 100 MG 24 hr tablet TAKE 1 TABLET EVERY DAY WITH A MEAL 90 tablet 3  . Multiple Vitamins-Iron (MULTIVITAMINS WITH IRON) TABS Take 1 tablet by mouth daily.    . simvastatin (ZOCOR) 20 MG tablet Take 1 tablet (20 mg total) by mouth every evening. 90 tablet 3   No current facility-administered medications for this visit.     Past Medical History:  Diagnosis Date  . Hypercholesterolemia   . Hypertension     Past Surgical History:  Procedure Laterality Date  . NO PAST SURGERIES      Social History Social History  Substance Use Topics  . Smoking status: Never Smoker  . Smokeless tobacco: Never Used  . Alcohol use No      Family History Family History  Problem Relation Age of Onset  . Heart disease Father     MI  . Stroke Mother   . Breast cancer Sister   . Colon cancer Neg Hx   . Prostate cancer Neg Hx      No Known Allergies   REVIEW OF SYSTEMS (Negative unless checked)  Constitutional: [] Weight loss  [] Fever  [] Chills Cardiac: [] Chest pain   [] Chest pressure   [] Palpitations   [] Shortness of breath when laying flat   [] Shortness of breath at rest   [] Shortness of breath  with exertion. Vascular:  [] Pain in legs with walking   [] Pain in legs at rest   [] Pain in legs when laying flat   [] Claudication   [] Pain in feet when walking  [] Pain in feet at rest  [] Pain in feet when laying flat   [] History of DVT   [] Phlebitis   [] Swelling in legs   [] Varicose veins   [] Non-healing ulcers Pulmonary:   [] Uses home oxygen   [] Productive cough   [] Hemoptysis   [] Wheeze  [] COPD   [] Asthma Neurologic:  [] Dizziness  [] Blackouts   [] Seizures   [] History of stroke   [] History of TIA  [] Aphasia   [] Temporary blindness   [] Dysphagia   [] Weakness or numbness in arms   [] Weakness or numbness in legs Musculoskeletal:  [] Arthritis   [] Joint swelling   [] Joint pain   [] Low back pain Hematologic:  [] Easy bruising  [] Easy bleeding   [] Hypercoagulable state   [] Anemic   Gastrointestinal:  [] Blood in stool   [] Vomiting blood  [] Gastroesophageal reflux/heartburn   [] Abdominal pain Genitourinary:  [] Chronic kidney disease   [] Difficult urination  [] Frequent urination  [] Burning with urination   [] Hematuria Skin:  [] Rashes   [] Ulcers   [] Wounds Psychological:  [] History of anxiety   []  History of major depression.  Physical Examination  BP (!) 152/82 (BP Location:  Right Arm)   Pulse 72   Resp 16   Ht 6' (1.829 m)   Wt 93.9 kg (207 lb)   BMI 28.07 kg/m  Gen:  WD/WN, NAD Head: [place]/AT, No temporalis wasting. Ear/Nose/Throat: Hearing grossly intact, nares w/o erythema or drainage, trachea midline Eyes: Conjunctiva clear. Sclera non-icteric Neck: Supple.  No JVD.  Pulmonary:  Good air movement, no use of accessory muscles.  Cardiac: RRR, normal S1, S2 Vascular:  Vessel Right Left  Radial Palpable Palpable  Ulnar Palpable Palpable  Brachial Palpable Palpable  Carotid Palpable, without bruit Palpable, with bruit  Aorta Not palpable N/A  Femoral Palpable Palpable  Popliteal Palpable Palpable  PT Palpable Palpable  DP Palpable Palpable   Gastrointestinal: soft, non-tender/non-distended.  No guarding/reflex.  Musculoskeletal: M/S 5/5 throughout.  No deformity or atrophy. No edema. Neurologic: Sensation grossly intact in extremities.  Symmetrical.  Speech is fluent.  Psychiatric: Judgment intact, Mood & affect appropriate for pt's clinical situation. Dermatologic: No rashes or ulcers noted.  No cellulitis or open wounds. Lymph : No Cervical, Axillary, or Inguinal lymphadenopathy.      Labs Recent Results (from the past 2160 hour(s))  I-STAT creatinine     Status: None   Collection Time: 01/04/16  9:08 AM  Result Value Ref Range   Creatinine, Ser 1.10 0.61 - 1.24 mg/dL    Radiology Ct Angio Neck W Or Wo Contrast  Result Date: 01/04/2016 CLINICAL DATA:  Abnormal noninvasive carotid study with carotid stenosis detected. EXAM: CT ANGIOGRAPHY NECK TECHNIQUE: Multidetector CT imaging of the neck was performed using the standard protocol during bolus administration of intravenous contrast. Multiplanar CT image reconstructions and MIPs were obtained to evaluate the vascular anatomy. Carotid stenosis measurements (when applicable) are obtained utilizing NASCET criteria, using the distal internal carotid diameter as the denominator. CONTRAST:  75 cc Isovue 370 COMPARISON:  None. FINDINGS: Aortic arch: Aortic atherosclerosis. Branching pattern of the brachiocephalic vessels is normal with a common origin of the innominate artery and common carotid artery. No flow limiting origin stenosis. Right carotid system: Common carotid artery is widely patent. There is some common carotid plaque but no stenosis greater than 20%. There is extensive atherosclerotic disease at the right carotid bifurcation. Minimal diameter of the proximal ICA is 4.8 mm. Compared to a more distal cervical ICA diameter of the same, there is no stenosis. Left carotid system: Common carotid artery shows some plaque but no stenosis greater than 20%. There is extensive atherosclerotic disease at the left carotid bifurcation  with severe stenosis of the proximal ICA in the bulb. Diameter is 1 mm or less. Compared to a more distal cervical ICA diameter of 5 mm, this indicates an 80% stenosis. Vertebral arteries: 50% stenosis at the right subclavian artery origin from the innominate. Bulky calcified plaque at the right vertebral artery origin with stenosis of 80% or greater. The vessel is patent beyond that, with atherosclerotic irregularity and narrowing throughout the cervical region with serial stenosis approximating 50%. Right vertebral artery does give supply to the basilar. There is atherosclerotic plaque at the left vertebral artery origin with stenosis estimated at 70%. Beyond that, the left vertebral artery is widely patent to the basilar. Skeleton: Ordinary spondylosis. Other neck: No mass or lymphadenopathy. Upper chest: Negative IMPRESSION: Aortic atherosclerosis. Atherosclerotic disease at the right carotid bifurcation but without measurable stenosis. Advanced atherosclerotic disease at the left carotid bifurcation and proximal ICA. Minimal diameter in the ICA bulb is 1 mm or less, consistent with 80% or greater  stenosis. Bilateral vertebral artery origins stenoses, 80% or greater on the right and 70% or greater on the left. There are also serial stenoses within the cervical vertebral artery on the right. Electronically Signed   By: Nelson Chimes M.D.   On: 01/04/2016 10:27      Assessment/Plan  Carotid stenosis I have independently reviewed the patient's CT angiogram. This demonstrates an approximately 80% left ICA stenosis and about a 20% right ICA stenosis. At 80%, the patient would clearly benefit from left carotid endarterectomy for stroke risk reduction given his reasonably good risk profile. I discussed the risks and benefits of the procedure. The patient voices his understanding and is agreeable to proceed.  Essential hypertension, benign blood pressure control important in reducing the progression of  atherosclerotic disease. On appropriate oral medications.   Hypercholesterolemia lipid control important in reducing the progression of atherosclerotic disease. Continue statin therapy     Leotis Pain, MD  01/05/2016 9:51 AM    This note was created with Dragon medical transcription system.  Any errors from dictation are purely unintentional

## 2016-01-05 NOTE — Assessment & Plan Note (Signed)
blood pressure control important in reducing the progression of atherosclerotic disease. On appropriate oral medications.  

## 2016-01-05 NOTE — Patient Instructions (Signed)
Carotid Endarterectomy   A carotid endarterectomy is a surgery to remove a blockage in the carotid arteries. The carotid arteries are the large blood vessels on both sides of the neck that supply blood to the brain. Carotid artery disease, also called carotid artery stenosis, is the narrowing or blockage of one or both carotid arteries. Carotid artery disease is usually caused by atherosclerosis, which is a buildup of fat and plaque in the arteries. Some plaque buildup normally occurs with aging. The plaque may partially or totally block blood flow or cause a clot to form in the carotid arteries.   LET YOUR HEALTH CARE PROVIDER KNOW ABOUT:  · Any allergies you have.    · All medicines you are taking, including vitamins, herbs, eye drops, creams, and over-the-counter medicines.    · Use of steroids (by mouth or creams).    · Previous problems you or members of your family have had with the use of anesthetics.    · Any blood disorders you have.    · Previous surgeries you have had.    · Medical conditions you have, including diabetes and kidney problems.    · Possibility of pregnancy, if this applies.    RISKS AND COMPLICATIONS  Generally, carotid endarterectomy is a safe procedure. However, problems can occur and include:  · Bleeding.    · Infection.    · Transient ischemic attacks (TIAs). A TIA results from poor blood flow to the brain, but there is no permanent loss of brain function.    · Stroke.    · Heart attack (myocardial infarction).    · High blood pressure (hypertension).    · Injury to nerves near the carotid arteries.    BEFORE THE PROCEDURE    · Ask your health care provider about changing or stopping your regular medicines. This is especially important if you are taking diabetes medicines or blood thinners.  · Do not eat or drink anything after midnight on the night before the procedure or as directed by your health care provider. Ask your health care provider if it is okay to take any needed medicines  with a sip of water.    · Do not smoke for as long as possible before the surgery. Smoking will increase the chance of a healing problem after surgery.    · You may need to have blood tests, a test to check heart rhythm (electrocardiography), or a test to check blood flow (angiography) done before the surgery.    PROCEDURE   · You will be given a medicine that makes you fall asleep (general anesthetic).  · After you receive the anesthetic, the surgeon will make a small cut (incision) in your neck to expose the carotid artery.  · A tube may be inserted into the carotid artery above and below the blockage. This tube will temporarily allow blood to flow around the blockage during the surgery.  · An incision will be made in the carotid artery at the location of the blockage, and the blockage will then be removed. In some cases, a section of the carotid artery may be removed and a graft patch used to repair the artery.  · The carotid artery will then be closed with stitches (sutures).  · If a tube was inserted into the artery to allow blood flow around the blockage during surgery, the tube will be removed. With the tube removal, blood flow to the brain will be restored through the carotid artery.  · The incision in the neck will then be closed with sutures.  AFTER THE PROCEDURE    You may   have some pain or an ache in your neck for a couple weeks. This is normal.      This information is not intended to replace advice given to you by your health care provider. Make sure you discuss any questions you have with your health care provider.     Document Released: 10/21/2012 Document Revised: 03/11/2014 Document Reviewed: 10/21/2012  Elsevier Interactive Patient Education ©2016 [employer]vier Inc.

## 2016-01-05 NOTE — Assessment & Plan Note (Signed)
lipid control important in reducing the progression of atherosclerotic disease. Continue statin therapy  

## 2016-01-09 ENCOUNTER — Telehealth (INDEPENDENT_AMBULATORY_CARE_PROVIDER_SITE_OTHER): Payer: Self-pay

## 2016-01-09 NOTE — Telephone Encounter (Signed)
Called trying to contact the patient to get him scheduled for a LCEA, I left a message for him to return my call.

## 2016-01-10 ENCOUNTER — Telehealth (INDEPENDENT_AMBULATORY_CARE_PROVIDER_SITE_OTHER): Payer: Self-pay

## 2016-01-10 NOTE — Telephone Encounter (Signed)
Patient returned my call regarding surgery and I let him know I would need to get him cardiac cleared. 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 I called back to the patient with an appointment for 01/11/16 @ 10:15, I had to leave a message.

## 2016-01-11 ENCOUNTER — Encounter: Payer: Self-pay | Admitting: Cardiology

## 2016-01-11 ENCOUNTER — Ambulatory Visit (INDEPENDENT_AMBULATORY_CARE_PROVIDER_SITE_OTHER): Payer: Commercial Managed Care - HMO | Admitting: Cardiology

## 2016-01-11 ENCOUNTER — Telehealth (INDEPENDENT_AMBULATORY_CARE_PROVIDER_SITE_OTHER): Payer: Self-pay

## 2016-01-11 VITALS — BP 130/92 | HR 78 | Ht 72.0 in | Wt 207.5 lb

## 2016-01-11 DIAGNOSIS — I10 Essential (primary) hypertension: Secondary | ICD-10-CM | POA: Diagnosis not present

## 2016-01-11 DIAGNOSIS — I6523 Occlusion and stenosis of bilateral carotid arteries: Secondary | ICD-10-CM

## 2016-01-11 DIAGNOSIS — E785 Hyperlipidemia, unspecified: Secondary | ICD-10-CM | POA: Diagnosis not present

## 2016-01-11 DIAGNOSIS — R0602 Shortness of breath: Secondary | ICD-10-CM

## 2016-01-11 DIAGNOSIS — Z01810 Encounter for preprocedural cardiovascular examination: Secondary | ICD-10-CM

## 2016-01-11 NOTE — Telephone Encounter (Signed)
Patient called wanting to be sure of his destination at the hospital and where he was going.

## 2016-01-11 NOTE — Patient Instructions (Signed)
Testing/Procedures: Your physician has requested that you have an echocardiogram. Echocardiography is a painless test that uses sound waves to create images of your heart. It provides your doctor with information about the size and shape of your heart and how well your heart's chambers and valves are working. This procedure takes approximately one hour. There are no restrictions for this procedure.  [place]  Your caregiver has ordered a Stress Test with nuclear imaging. The purpose of this test is to evaluate the blood supply to your heart muscle. This procedure is referred to as a "Non-Invasive Stress Test." This is because other than having an IV started in your vein, nothing is inserted or "invades" your body. Cardiac stress tests are done to find areas of poor blood flow to the heart by determining the extent of coronary artery disease (CAD). Some patients exercise on a treadmill, which naturally increases the blood flow to your heart, while others who are  unable to walk on a treadmill due to physical limitations have a pharmacologic/chemical stress agent called Lexiscan . This medicine will mimic walking on a treadmill by temporarily increasing your coronary blood flow.   Please note: these test may take anywhere between 2-4 hours to complete  PLEASE REPORT TO [facility] AT THE FIRST DESK WILL DIRECT YOU WHERE TO GO  Date of Procedure:_Thursday January 18, 2016 at 09:00AM_  Arrival Time for Procedure:__Arrive at 08:45AM to register__  Instructions regarding medication:   _X___ : Hold Hydrochlorothiazide the morning of procedure  _X___:  Hold metoprolol the night before procedure and morning of procedure   PLEASE NOTIFY THE OFFICE AT LEAST 24 HOURS IN ADVANCE IF YOU ARE UNABLE TO KEEP YOUR APPOINTMENT.  [phone] AND  PLEASE NOTIFY NUCLEAR MEDICINE AT [facility] AT LEAST 24 HOURS IN ADVANCE IF YOU ARE UNABLE TO KEEP YOUR APPOINTMENT.  [phone]  How to prepare for your Myoview test:  1. Do not eat or drink after midnight 2. No caffeine for 24 hours prior to test 3. No smoking 24 hours prior to test. 4. Your medication may be taken with water.  If your doctor stopped a medication because of this test, do not take that medication. 5. Ladies, please do not wear dresses.  Skirts or pants are appropriate. Please wear a short sleeve shirt. 6. No perfume, cologne or lotion. 7. Wear comfortable walking shoes. No heels!   Follow-Up: Your physician recommends that you schedule a follow-up appointment as needed with Dr. Yvone Neu. We will call you with results and if needed schedule follow up at that time.   It was a pleasure seeing you today here in the office. Please do not hesitate to give [place] a call back if you have any further questions. [place], BSN   Echocardiogram An echocardiogram, or echocardiography, uses sound waves (ultrasound) to produce an image of your heart. The echocardiogram is simple, painless, obtained within a short period of time, and offers valuable information to your health care provider. The images from an echocardiogram can provide information such as:  Evidence of coronary artery disease (CAD).  Heart size.  Heart muscle function.  Heart valve function.  Aneurysm detection.  Evidence of a past heart attack.  Fluid buildup around the heart.  Heart muscle thickening.  Assess heart valve function. LET [facility] CARE PROVIDER KNOW ABOUT:  Any allergies you have.  All medicines you are taking, including vitamins, herbs, eye drops, creams, and over-the-counter medicines.  Previous problems you or members of your family have had with the use of anesthetics.  Any blood disorders you have.  Previous surgeries you have had.  Medical conditions you have.  Possibility of pregnancy, if this applies. BEFORE THE PROCEDURE  No special preparation is needed. Eat and  drink normally.  PROCEDURE   In order to produce an image of your heart, gel will be applied to your chest and a wand-like tool (transducer) will be moved over your chest. The gel will help transmit the sound waves from the transducer. The sound waves will harmlessly bounce off your heart to allow the heart images to be captured in real-time motion. These images will then be recorded.  You may need an IV to receive a medicine that improves the quality of the pictures. AFTER THE PROCEDURE You may return to your normal schedule including diet, activities, and medicines, unless your health care provider tells you otherwise.   This information is not intended to replace advice given to you by your health care provider. Make sure you discuss any questions you have with your health care provider.   Document Released: 02/16/2000 Document Revised: 03/11/2014 Document Reviewed: 10/26/2012 Elsevier Interactive Patient Education 2016 [place].    Cardiac Nuclear Scanning A cardiac nuclear scan is used to check your heart for problems, such as the following:  A portion of the heart is not getting enough blood.  Part of the heart muscle has died, which happens with a heart attack.  The heart wall is not working normally.  In this test, a radioactive dye (tracer) is injected into your bloodstream. After the tracer has traveled to your heart, a scanning device is used to measure how much of the tracer is absorbed by or distributed to various areas of your heart. LET [facility] CARE PROVIDER KNOW ABOUT:  Any allergies you have.  All medicines you are taking, including vitamins, herbs, eye drops, creams, and over-the-counter medicines.  Previous problems you or members of your family have had with the use of anesthetics.  Any blood disorders you have.  Previous surgeries you have had.  Medical conditions you have.  RISKS AND COMPLICATIONS Generally, this is a safe procedure. However, as  with any procedure, problems can occur. Possible problems include:   Serious chest pain.  Rapid heartbeat.  Sensation of warmth in your chest. This usually passes quickly. BEFORE THE PROCEDURE Ask your health care provider about changing or stopping your regular medicines. PROCEDURE This procedure is usually done at a hospital and takes 2-4 hours.  An IV tube is inserted into one of your veins.  Your health care provider will inject a small amount of radioactive tracer through the tube.  You will then wait for 20-40 minutes while the tracer travels through your bloodstream.  You will lie down on an exam table so images of your heart can be taken. Images will be taken for about 15-20 minutes.  You will exercise on a treadmill or stationary bike. While you exercise, your heart activity will be monitored with an electrocardiogram (ECG), and your blood pressure will be checked.  If you are unable to exercise, you may be given a medicine to make your heart beat faster.  When blood flow to your heart has peaked, tracer will again be injected through the IV tube.  After 20-40 minutes, you will get back on the exam table and have more images taken of your heart.  When the procedure is over, your IV  tube will be removed. AFTER THE PROCEDURE  You will likely be able to leave shortly after the test. Unless your health care provider tells you otherwise, you may return to your normal schedule, including diet, activities, and medicines.  Make sure you find out how and when you will get your test results.   This information is not intended to replace advice given to you by your health care provider. Make sure you discuss any questions you have with your health care provider.   Document Released: 03/15/2004 Document Revised: 02/23/2013 Document Reviewed: 01/27/2013 Elsevier Interactive Patient Education [employer].

## 2016-01-11 NOTE — Progress Notes (Signed)
Cardiology Office Note   Date:  01/11/2016   ID:  Brett Riley, DOB 1935-07-11, MRN [id number]  Referring Doctor:  Einar Pheasant, MD Dr. Lucky Cowboy  Cardiologist:   Wende Bushy, MD   Reason for consultation:  Chief Complaint  Patient presents with  . other    New Patient. Refferal from vein and vascular. "Doing well" Reviewed meds with pt verbally.   Preoperative evaluation prior to left carotid endarterectomy   History of Present Illness: Brett Riley is a 80 y.o. male who presents for preop operative cardiac evaluation prior to left CEA  He has been seeing Dr. Lucky Cowboy for carotid artery disease. Most recent imaging revealed that carotid artery stenosis on the left ICA was significant, and he would likely benefit from left CEA.  His had no strokelike symptoms. No chest pain. He is still able to monitor his lawn and do some physical activity possibly with some shortness of breath. No palpitations, lightheadedness or syncope.   ROS:  Please see the history of present illness. Aside from mentioned under HPI, all other systems are reviewed and negative.     Past Medical History:  Diagnosis Date  . Hypercholesterolemia   . Hypertension     Past Surgical History:  Procedure Laterality Date  . NO PAST SURGERIES       reports that he has never smoked. He has never used smokeless tobacco. He reports that he does not drink alcohol or use drugs.   family history includes Breast cancer in his sister; Heart attack in his father; Heart disease in his father; Stroke in his mother.   Outpatient Medications Prior to Visit  Medication Sig Dispense Refill  . aspirin 81 MG tablet Take 81 mg by mouth daily.    . hydrochlorothiazide (HYDRODIURIL) 25 MG tablet Take 1 tablet (25 mg total) by mouth daily. 90 tablet 3  . metoprolol succinate (TOPROL-XL) 100 MG 24 hr tablet TAKE 1 TABLET EVERY DAY WITH A MEAL 90 tablet 3  . Multiple Vitamins-Iron (MULTIVITAMINS WITH IRON) TABS Take 1 tablet  by mouth daily.    . simvastatin (ZOCOR) 20 MG tablet Take 1 tablet (20 mg total) by mouth every evening. 90 tablet 3   No facility-administered medications prior to visit.      Allergies: Patient has no known allergies.    PHYSICAL EXAM: VS:  BP (!) 130/92 (BP Location: Right Arm, Patient Position: Sitting, Cuff Size: Normal)   Pulse 78   Ht 6' (1.829 m)   Wt 207 lb 8 oz (94.1 kg)   BMI 28.14 kg/m  , Body mass index is 28.14 kg/m. Wt Readings from Last 3 Encounters:  01/11/16 207 lb 8 oz (94.1 kg)  01/05/16 207 lb (93.9 kg)  09/07/15 206 lb (93.4 kg)    GENERAL:  well developed, well nourished, not in acute distress HEENT: normocephalic, pink conjunctivae, anicteric sclerae, no xanthelasma, normal dentition, oropharynx clear NECK:  no neck vein engorgement, JVP normal, no hepatojugular reflux, carotid upstroke brisk and symmetric, soft left carotid bruit, no thyromegaly, no lymphadenopathy LUNGS:  good respiratory effort, clear to auscultation bilaterally CV:  PMI not displaced, no thrills, no lifts, S1 and S2 within normal limits, no palpable S3 or S4, no murmurs, no rubs, no gallops ABD:  Soft, nontender, nondistended, normoactive bowel sounds, no abdominal aortic bruit, no hepatomegaly, no splenomegaly MS: nontender back, no kyphosis, no scoliosis, no joint deformities EXT:  2+ DP/PT pulses, no edema, no varicosities, no cyanosis, no  clubbing SKIN: warm, nondiaphoretic, normal turgor, no ulcers NEUROPSYCH: alert, oriented to person, place, and time, sensory/motor grossly intact, normal mood, appropriate affect  Recent Labs: 09/07/2015: ALT 18; BUN 18; Hemoglobin 14.3; Platelets 165.0; Potassium 3.9; Sodium 139 01/04/2016: Creatinine, Ser 1.10   Lipid Panel    Component Value Date/Time   CHOL 141 09/07/2015 0906   TRIG 88.0 09/07/2015 0906   HDL 41.40 09/07/2015 0906   CHOLHDL 3 09/07/2015 0906   VLDL 17.6 09/07/2015 0906   LDLCALC 82 09/07/2015 0906     Other studies  Reviewed:  EKG:  The ekg from 01/11/2016 was personally reviewed by me and it revealed sinus rhythm, 78 BPM.  Additional studies/ records that were reviewed personally reviewed by me today include: None available   ASSESSMENT AND PLAN:  Preoperative cardiac evaluation prior to left CEA Possibly some shortness of breath Hypertension Hyperlipidemia Multiple risk factors for CAD including establish carotid artery disease Recommend further evaluation with echocardiogram and exercise nuclear stress test. Recommended LDL goal is less than 70. PCP following labs. If no evidence of ischemia on stress test and echocardiogram is unremarkable, patient's cardiac risk is likely intermediate for moderate risk procedure.  Current medicines are reviewed at length with the patient today.  The patient does not have concerns regarding medicines.  Labs/ tests ordered today include:  Orders Placed This Encounter  Procedures  . EKG 12-Lead    I had a lengthy and detailed discussion with the patient regarding diagnoses, prognosis, diagnostic options, treatment options , and side effects of medications.   I counseled the patient on importance of lifestyle modification including heart healthy diet, regular physical activity  , and smoking cessation.   Disposition:   FU with undersigned after tests   Signed, Wende Bushy, MD  01/11/2016 10:50 AM    [place]  This note was generated in part with voice recognition software and I apologize for any typographical errors that were not detected and corrected.

## 2016-01-18 ENCOUNTER — Encounter: Payer: Self-pay | Admitting: *Deleted

## 2016-01-18 ENCOUNTER — Encounter
Admission: RE | Admit: 2016-01-18 | Discharge: 2016-01-18 | Disposition: A | Discharge status: Home / Self Care | Payer: Commercial Managed Care - HMO | Source: Ambulatory Visit | Attending: Cardiology | Admitting: Cardiology

## 2016-01-18 ENCOUNTER — Other Ambulatory Visit
Admission: RE | Admit: 2016-01-18 | Discharge: 2016-01-18 | Disposition: A | Discharge status: Home / Self Care | Payer: Commercial Managed Care - HMO | Source: Ambulatory Visit | Attending: Cardiology | Admitting: Cardiology

## 2016-01-18 ENCOUNTER — Other Ambulatory Visit: Payer: Self-pay | Admitting: Cardiology

## 2016-01-18 ENCOUNTER — Telehealth: Payer: Self-pay | Admitting: *Deleted

## 2016-01-18 DIAGNOSIS — R0602 Shortness of breath: Secondary | ICD-10-CM | POA: Insufficient documentation

## 2016-01-18 DIAGNOSIS — Z01810 Encounter for preprocedural cardiovascular examination: Secondary | ICD-10-CM | POA: Insufficient documentation

## 2016-01-18 DIAGNOSIS — Z01812 Encounter for preprocedural laboratory examination: Secondary | ICD-10-CM

## 2016-01-18 DIAGNOSIS — R9439 Abnormal result of other cardiovascular function study: Secondary | ICD-10-CM

## 2016-01-18 LAB — CBC WITH DIFFERENTIAL/PLATELET
Basophils Absolute: 0 10*3/uL (ref 0–0.1)
Basophils Relative: 1 %
Eosinophils Absolute: 0.2 10*3/uL (ref 0–0.7)
Eosinophils Relative: 3 %
HCT: 40.9 % (ref 40.0–52.0)
Hemoglobin: 14.2 g/dL (ref 13.0–18.0)
Lymphocytes Relative: 32 %
Lymphs Abs: 1.6 10*3/uL (ref 1.0–3.6)
MCH: 33 pg (ref 26.0–34.0)
MCHC: 34.8 g/dL (ref 32.0–36.0)
MCV: 95 fL (ref 80.0–100.0)
Monocytes Absolute: 0.5 10*3/uL (ref 0.2–1.0)
Monocytes Relative: 9 %
Neutro Abs: 2.9 10*3/uL (ref 1.4–6.5)
Neutrophils Relative %: 55 %
Platelets: 134 10*3/uL — ABNORMAL LOW (ref 150–440)
RBC: 4.3 MIL/uL — ABNORMAL LOW (ref 4.40–5.90)
RDW: 13.6 % (ref 11.5–14.5)
WBC: 5.2 10*3/uL (ref 3.8–10.6)

## 2016-01-18 LAB — NM MYOCAR MULTI W/SPECT W/WALL MOTION / EF
Estimated workload: 4.6 METS
Exercise duration (min): 3 min
Exercise duration (sec): 30 s
LV dias vol: 92 mL (ref 62–150)
LV sys vol: 33 mL
Peak HR: 144 {beats}/min
Percent HR: 101 %
Rest HR: 59 {beats}/min
SDS: 12
SRS: 1
SSS: 7
TID: 0.92

## 2016-01-18 LAB — BASIC METABOLIC PANEL WITH GFR
Anion gap: 7 (ref 5–15)
BUN: 25 mg/dL — ABNORMAL HIGH (ref 6–20)
CO2: 30 mmol/L (ref 22–32)
Calcium: 9.4 mg/dL (ref 8.9–10.3)
Chloride: 103 mmol/L (ref 101–111)
Creatinine, Ser: 0.98 mg/dL (ref 0.61–1.24)
GFR calc Af Amer: >60 mL/min
GFR calc non Af Amer: >60 mL/min
Glucose, Bld: 96 mg/dL (ref 65–99)
Potassium: 3.8 mmol/L (ref 3.5–5.1)
Sodium: 140 mmol/L (ref 135–145)

## 2016-01-18 LAB — PROTIME-INR
INR: 0.97
Prothrombin Time: 12.9 s (ref 11.4–15.2)

## 2016-01-18 MED ORDER — TECHNETIUM TC 99M TETROFOSMIN IV KIT
12.0400 | PACK | Freq: Once | INTRAVENOUS | Status: AC | PRN
  Administered 2016-01-18: 12.04 via INTRAVENOUS

## 2016-01-18 MED ORDER — TECHNETIUM TC 99M TETROFOSMIN IV KIT
29.9600 | PACK | Freq: Once | INTRAVENOUS | Status: AC | PRN
  Administered 2016-01-18: 29.96 via INTRAVENOUS

## 2016-01-18 NOTE — Telephone Encounter (Signed)
Spoke with patients wife per release form and reviewed instructions, date, time and location for catheterization for tomorrow at 08:30AM at [facility]. Patient needs to arrive at 07:30AM to register at the medical mall entrance. Instructed her to have him come by the office today to pick up lab slips to have labs done today prior to his procedure. She verbalized understanding and stated that she would have him come on over to get those done. Will type up letter with instructions for procedure and lab slips for him to pick up.

## 2016-01-18 NOTE — Progress Notes (Signed)
Spoke with patients wife per release form and reviewed instructions for procedure tomorrow morning. Let her know that they might get a call from the pre-service center as well. Patient will come by to pick up instructions and lab slips for this.

## 2016-01-18 NOTE — Telephone Encounter (Signed)
-----   Message from Wende Bushy, MD sent at 01/18/2016  2:31 PM EST ----- Eldridge Abrahams (wife) Phone number [phone] I had a lengthy and detailed discussion with the patient and his wife regarding the results of the stress test. I discussed the recommendation of proceeding with left heart catheterization for further evaluation first.This is in light of the fact that he only walked less than 4 minutes, exaggerated heart rate response, EKG changes noted on the stress EKG, and nuclear perfusion being abnormal. Risks and benefits of cardiac catheterization have been discussed with the patient.  These include less than 1% chance of major complications including but not limited to bleeding, infection, kidney damage, arrhyhthmia, heart attack, stroke, and death.  The patient understands these risks and is willing to proceed. Please call patient to set up the heart cath with whorever has first available opening

## 2016-01-19 ENCOUNTER — Encounter
Admission: RE | Disposition: A | Discharge status: Home / Self Care | Payer: Self-pay | Source: Ambulatory Visit | Attending: Cardiovascular Disease

## 2016-01-19 ENCOUNTER — Ambulatory Visit
Admission: RE | Admit: 2016-01-19 | Discharge: 2016-01-19 | Disposition: A | Discharge status: Home / Self Care | Payer: Commercial Managed Care - HMO | Source: Ambulatory Visit | Attending: Cardiovascular Disease | Admitting: Cardiovascular Disease

## 2016-01-19 DIAGNOSIS — E785 Hyperlipidemia, unspecified: Secondary | ICD-10-CM | POA: Diagnosis not present

## 2016-01-19 DIAGNOSIS — Z8249 Family history of ischemic heart disease and other diseases of the circulatory system: Secondary | ICD-10-CM | POA: Insufficient documentation

## 2016-01-19 DIAGNOSIS — E7800 Pure hypercholesterolemia, unspecified: Secondary | ICD-10-CM | POA: Insufficient documentation

## 2016-01-19 DIAGNOSIS — I10 Essential (primary) hypertension: Secondary | ICD-10-CM | POA: Insufficient documentation

## 2016-01-19 DIAGNOSIS — I2510 Atherosclerotic heart disease of native coronary artery without angina pectoris: Secondary | ICD-10-CM | POA: Diagnosis not present

## 2016-01-19 DIAGNOSIS — Z79899 Other long term (current) drug therapy: Secondary | ICD-10-CM | POA: Diagnosis not present

## 2016-01-19 DIAGNOSIS — I2584 Coronary atherosclerosis due to calcified coronary lesion: Secondary | ICD-10-CM | POA: Diagnosis not present

## 2016-01-19 DIAGNOSIS — R9439 Abnormal result of other cardiovascular function study: Secondary | ICD-10-CM

## 2016-01-19 DIAGNOSIS — Z7982 Long term (current) use of aspirin: Secondary | ICD-10-CM | POA: Insufficient documentation

## 2016-01-19 DIAGNOSIS — Z01810 Encounter for preprocedural cardiovascular examination: Secondary | ICD-10-CM

## 2016-01-19 HISTORY — PX: CARDIAC CATHETERIZATION: SHX172

## 2016-01-19 SURGERY — LEFT HEART CATH AND CORONARY ANGIOGRAPHY
Anesthesia: Moderate Sedation | Laterality: Right

## 2016-01-19 SURGERY — LEFT HEART CATH AND CORONARY ANGIOGRAPHY
Anesthesia: Moderate Sedation

## 2016-01-19 MED ORDER — FENTANYL CITRATE (PF) 100 MCG/2ML IJ SOLN
INTRAMUSCULAR | Status: DC | PRN
  Administered 2016-01-19: 50 ug via INTRAVENOUS

## 2016-01-19 MED ORDER — SODIUM CHLORIDE 0.9% FLUSH: 3.0000 mL | INTRAVENOUS | Status: DC | PRN

## 2016-01-19 MED ORDER — SODIUM CHLORIDE 0.9% FLUSH: 3.0000 mL | Freq: Two times a day (BID) | INTRAVENOUS | Status: DC

## 2016-01-19 MED ORDER — HEPARIN SODIUM (PORCINE) 1000 UNIT/ML IJ SOLN
INTRAMUSCULAR | Status: DC | PRN
  Administered 2016-01-19: 4500 [IU] via INTRAVENOUS

## 2016-01-19 MED ORDER — MIDAZOLAM HCL 2 MG/2ML IJ SOLN
INTRAMUSCULAR | Status: DC | PRN
  Administered 2016-01-19: 1 mg via INTRAVENOUS

## 2016-01-19 MED ORDER — SODIUM CHLORIDE 0.9 % IV SOLN: 250.0000 mL | INTRAVENOUS | Status: DC | PRN

## 2016-01-19 MED ORDER — SODIUM CHLORIDE 0.9 % IV SOLN
INTRAVENOUS | Status: DC
  Administered 2016-01-19: 1000 mL via INTRAVENOUS

## 2016-01-19 MED ORDER — ASPIRIN 81 MG PO CHEW
CHEWABLE_TABLET | ORAL | Status: AC
  Administered 2016-01-19: 81 mg via ORAL
  Filled 2016-01-19: qty 1

## 2016-01-19 MED ORDER — HEPARIN SODIUM (PORCINE) 1000 UNIT/ML IJ SOLN
INTRAMUSCULAR | Status: AC
  Filled 2016-01-19: qty 1

## 2016-01-19 MED ORDER — IOPAMIDOL (ISOVUE-300) INJECTION 61%
INTRAVENOUS | Status: DC | PRN
  Administered 2016-01-19: 90 mL via INTRA_ARTERIAL

## 2016-01-19 MED ORDER — VERAPAMIL HCL 2.5 MG/ML IV SOLN
INTRAVENOUS | Status: DC | PRN
  Administered 2016-01-19: 2.5 mg via INTRAVENOUS

## 2016-01-19 MED ORDER — ASPIRIN 81 MG PO CHEW
81.0000 mg | CHEWABLE_TABLET | Freq: Once | ORAL | Status: AC
  Administered 2016-01-19: 81 mg via ORAL

## 2016-01-19 MED ORDER — MIDAZOLAM HCL 2 MG/2ML IJ SOLN
INTRAMUSCULAR | Status: AC
  Filled 2016-01-19: qty 2

## 2016-01-19 MED ORDER — FENTANYL CITRATE (PF) 100 MCG/2ML IJ SOLN
INTRAMUSCULAR | Status: AC
  Filled 2016-01-19: qty 2

## 2016-01-19 MED ORDER — VERAPAMIL HCL 2.5 MG/ML IV SOLN
INTRAVENOUS | Status: AC
  Filled 2016-01-19: qty 2

## 2016-01-19 MED ORDER — SODIUM CHLORIDE 0.9 % WEIGHT BASED INFUSION: 1.0000 mL/kg/h | INTRAVENOUS | Status: DC

## 2016-01-19 MED ORDER — HEPARIN (PORCINE) IN NACL 2-0.9 UNIT/ML-% IJ SOLN
INTRAMUSCULAR | Status: AC
  Filled 2016-01-19: qty 500

## 2016-01-19 SURGICAL SUPPLY — 7 items
CATH INFINITI 5FR ANG PIGTAIL (CATHETERS) ×2
CATH OPTITORQUE JACKY 4.0 5F (CATHETERS) ×4
DEVICE RAD TR BAND REGULAR (VASCULAR PRODUCTS) ×2
GLIDESHEATH SLEND SS 6F .021 (SHEATH) ×2
KIT MANI 3VAL PERCEP (MISCELLANEOUS) ×2
PACK CARDIAC CATH (CUSTOM PROCEDURE TRAY) ×2
WIRE ROSEN-J .035X260CM (WIRE) ×2

## 2016-01-19 NOTE — Progress Notes (Signed)
2 cc of air removed from right TR band.  No bleeding noted.  Patient with no c/o.

## 2016-01-19 NOTE — Progress Notes (Signed)
2 cc of air removed from right TR band.  Site without bleeding.  No c/o from patient.

## 2016-01-19 NOTE — H&P (View-Only) (Signed)
Cardiology Office Note   Date:  01/11/2016   ID:  Brett Riley, DOB 12-10-1935, MRN [id number]  Referring Doctor:  Einar Pheasant, MD Dr. Lucky Cowboy  Cardiologist:   Wende Bushy, MD   Reason for consultation:  Chief Complaint  Patient presents with  . other    New Patient. Refferal from vein and vascular. "Doing well" Reviewed meds with pt verbally.   Preoperative evaluation prior to left carotid endarterectomy   History of Present Illness: Brett Riley is a 80 y.o. male who presents for preop operative cardiac evaluation prior to left CEA  He has been seeing Dr. Lucky Cowboy for carotid artery disease. Most recent imaging revealed that carotid artery stenosis on the left ICA was significant, and he would likely benefit from left CEA.  His had no strokelike symptoms. No chest pain. He is still able to monitor his lawn and do some physical activity possibly with some shortness of breath. No palpitations, lightheadedness or syncope.   ROS:  Please see the history of present illness. Aside from mentioned under HPI, all other systems are reviewed and negative.     Past Medical History:  Diagnosis Date  . Hypercholesterolemia   . Hypertension     Past Surgical History:  Procedure Laterality Date  . NO PAST SURGERIES       reports that he has never smoked. He has never used smokeless tobacco. He reports that he does not drink alcohol or use drugs.   family history includes Breast cancer in his sister; Heart attack in his father; Heart disease in his father; Stroke in his mother.   Outpatient Medications Prior to Visit  Medication Sig Dispense Refill  . aspirin 81 MG tablet Take 81 mg by mouth daily.    . hydrochlorothiazide (HYDRODIURIL) 25 MG tablet Take 1 tablet (25 mg total) by mouth daily. 90 tablet 3  . metoprolol succinate (TOPROL-XL) 100 MG 24 hr tablet TAKE 1 TABLET EVERY DAY WITH A MEAL 90 tablet 3  . Multiple Vitamins-Iron (MULTIVITAMINS WITH IRON) TABS Take 1 tablet  by mouth daily.    . simvastatin (ZOCOR) 20 MG tablet Take 1 tablet (20 mg total) by mouth every evening. 90 tablet 3   No facility-administered medications prior to visit.      Allergies: Patient has no known allergies.    PHYSICAL EXAM: VS:  BP (!) 130/92 (BP Location: Right Arm, Patient Position: Sitting, Cuff Size: Normal)   Pulse 78   Ht 6' (1.829 m)   Wt 207 lb 8 oz (94.1 kg)   BMI 28.14 kg/m  , Body mass index is 28.14 kg/m. Wt Readings from Last 3 Encounters:  01/11/16 207 lb 8 oz (94.1 kg)  01/05/16 207 lb (93.9 kg)  09/07/15 206 lb (93.4 kg)    GENERAL:  well developed, well nourished, not in acute distress HEENT: normocephalic, pink conjunctivae, anicteric sclerae, no xanthelasma, normal dentition, oropharynx clear NECK:  no neck vein engorgement, JVP normal, no hepatojugular reflux, carotid upstroke brisk and symmetric, soft left carotid bruit, no thyromegaly, no lymphadenopathy LUNGS:  good respiratory effort, clear to auscultation bilaterally CV:  PMI not displaced, no thrills, no lifts, S1 and S2 within normal limits, no palpable S3 or S4, no murmurs, no rubs, no gallops ABD:  Soft, nontender, nondistended, normoactive bowel sounds, no abdominal aortic bruit, no hepatomegaly, no splenomegaly MS: nontender back, no kyphosis, no scoliosis, no joint deformities EXT:  2+ DP/PT pulses, no edema, no varicosities, no cyanosis, no  clubbing SKIN: warm, nondiaphoretic, normal turgor, no ulcers NEUROPSYCH: alert, oriented to person, place, and time, sensory/motor grossly intact, normal mood, appropriate affect  Recent Labs: 09/07/2015: ALT 18; BUN 18; Hemoglobin 14.3; Platelets 165.0; Potassium 3.9; Sodium 139 01/04/2016: Creatinine, Ser 1.10   Lipid Panel    Component Value Date/Time   CHOL 141 09/07/2015 0906   TRIG 88.0 09/07/2015 0906   HDL 41.40 09/07/2015 0906   CHOLHDL 3 09/07/2015 0906   VLDL 17.6 09/07/2015 0906   LDLCALC 82 09/07/2015 0906     Other studies  Reviewed:  EKG:  The ekg from 01/11/2016 was personally reviewed by me and it revealed sinus rhythm, 78 BPM.  Additional studies/ records that were reviewed personally reviewed by me today include: None available   ASSESSMENT AND PLAN:  Preoperative cardiac evaluation prior to left CEA Possibly some shortness of breath Hypertension Hyperlipidemia Multiple risk factors for CAD including establish carotid artery disease Recommend further evaluation with echocardiogram and exercise nuclear stress test. Recommended LDL goal is less than 70. PCP following labs. If no evidence of ischemia on stress test and echocardiogram is unremarkable, patient's cardiac risk is likely intermediate for moderate risk procedure.  Current medicines are reviewed at length with the patient today.  The patient does not have concerns regarding medicines.  Labs/ tests ordered today include:  Orders Placed This Encounter  Procedures  . EKG 12-Lead    I had a lengthy and detailed discussion with the patient regarding diagnoses, prognosis, diagnostic options, treatment options , and side effects of medications.   I counseled the patient on importance of lifestyle modification including heart healthy diet, regular physical activity  , and smoking cessation.   Disposition:   FU with undersigned after tests   Signed, Wende Bushy, MD  01/11/2016 10:50 AM    [place]  This note was generated in part with voice recognition software and I apologize for any typographical errors that were not detected and corrected.

## 2016-01-19 NOTE — Discharge Instructions (Signed)
Angiogram, Care After °These instructions give you information about caring for yourself after your procedure. Your doctor may also give you more specific instructions. Call your doctor if you have any problems or questions after your procedure. °Follow these instructions at home: °· Take medicines only as told by your doctor. °· Follow your doctor's instructions about: °¨ Care of the area where the tube was inserted. °¨ Bandage (dressing) changes and removal. °· You may shower 24-48 hours after the procedure or as told by your doctor. °· Do not take baths, swim, or use a hot tub until your doctor approves. °· Every day, check the area where the tube was inserted. Watch for: °¨ Redness, swelling, or pain. °¨ Fluid, blood, or pus. °· Do not apply powder or lotion to the site. °· Do not lift anything that is heavier than 10 lb (4.5 kg) for 5 days or as told by your doctor. °· Ask your doctor when you can: °¨ Return to work or school. °¨ Do physical activities or play sports. °¨ Have sex. °· Do not drive or operate heavy machinery for 24 hours or as told by your doctor. °· Have someone with you for the first 24 hours after the procedure. °· Keep all follow-up visits as told by your doctor. This is important. °Contact a health care provider if: °· You have a fever. °· You have chills. °· You have more bleeding from the area where the tube was inserted. Hold pressure on the area. °· You have redness, swelling, or pain in the area where the tube was inserted. °· You have fluid or pus coming from the area. °Get help right away if: °· You have a lot of pain in the area where the tube was inserted. °· The area where the tube was inserted is bleeding, and the bleeding does not stop after 30 minutes of holding steady pressure on the area. °· The area near or just beyond the insertion site becomes pale, cool, tingly, or numb. °This information is not intended to replace advice given to you by your health care provider. Make  sure you discuss any questions you have with your health care provider. °Document Released: 05/17/2008 Document Revised: 07/27/2015 Document Reviewed: 07/22/2012 °Elsevier Interactive Patient Education © 2017 Elsevier Inc. ° °

## 2016-01-19 NOTE — Progress Notes (Signed)
2 cc of air removed from right TR band.  No bleeding noted.  No c/o from patient.

## 2016-01-19 NOTE — Progress Notes (Signed)
2 cc of air removed from right TR band.

## 2016-01-19 NOTE — Progress Notes (Signed)
Right TR band removed and gauze/transparent dressing applied.  No bleeding noted.  Patient without any redness, swelling, or bleeding.

## 2016-01-19 NOTE — Progress Notes (Signed)
2 cc of air removed from right TR band.  No bleeding noted.

## 2016-01-19 NOTE — Interval H&P Note (Signed)
History and Physical Interval Note:  01/19/2016 8:48 AM  Brett Riley  has presented today for surgery, with the diagnosis of chest pain  The various methods of treatment have been discussed with the patient and family. After consideration of risks, benefits and other options for treatment, the patient has consented to  Procedure(s): Left Heart Cath and Coronary Angiography (N/A) as a surgical intervention .  The patient's history has been reviewed, patient examined, no change in status, stable for surgery.  I have reviewed the patient's chart and labs.  Questions were answered to the patient's satisfaction.     Kathlyn [place]

## 2016-01-23 ENCOUNTER — Ambulatory Visit (INDEPENDENT_AMBULATORY_CARE_PROVIDER_SITE_OTHER): Payer: Commercial Managed Care - HMO | Admitting: Cardiology

## 2016-01-23 ENCOUNTER — Encounter: Payer: Self-pay | Admitting: Cardiology

## 2016-01-23 VITALS — BP 120/70 | HR 63 | Ht 72.0 in | Wt 204.8 lb

## 2016-01-23 DIAGNOSIS — I2510 Atherosclerotic heart disease of native coronary artery without angina pectoris: Secondary | ICD-10-CM | POA: Diagnosis not present

## 2016-01-23 DIAGNOSIS — E785 Hyperlipidemia, unspecified: Secondary | ICD-10-CM

## 2016-01-23 DIAGNOSIS — I10 Essential (primary) hypertension: Secondary | ICD-10-CM | POA: Diagnosis not present

## 2016-01-23 DIAGNOSIS — Z01812 Encounter for preprocedural laboratory examination: Secondary | ICD-10-CM

## 2016-01-23 DIAGNOSIS — E7800 Pure hypercholesterolemia, unspecified: Secondary | ICD-10-CM

## 2016-01-23 MED ORDER — ISOSORBIDE MONONITRATE ER 30 MG PO TB24: 30.0000 mg | ORAL_TABLET | Freq: Every day | ORAL | 6 refills | Status: DC

## 2016-01-23 MED ORDER — ROSUVASTATIN CALCIUM 10 MG PO TABS: 10.0000 mg | ORAL_TABLET | Freq: Every day | ORAL | 6 refills | Status: DC

## 2016-01-23 MED ORDER — NITROGLYCERIN 0.4 MG SL SUBL: 0.4000 mg | SUBLINGUAL_TABLET | SUBLINGUAL | 6 refills | Status: DC | PRN

## 2016-01-23 NOTE — Patient Instructions (Addendum)
Medication Instructions:  Your physician has recommended you make the following change in your medication:  1. STOP taking simvastatin 2. STOP taking hydrochlorothiazide  3. START Crestor 10 mg Once daily at bedtime 4. START Imdur (Isosorbide mononitrate) 30 mg Once daily 5. Nitroglycerin 0.4 mg under the tongue as needed for chest pain. You may take 1 tablet every 5 minutes for a total of 3. Please read attached materials before taking first dose.   Labwork: Your physician recommends that you return for lab work in: 3 months for Lipid and liver labs to be done. Make sure to not eat or drink after midnight the night before coming in.   Follow-Up: Your physician recommends that you schedule a follow-up appointment in: 3 months with Dr. Yvone Neu.  It was a pleasure seeing you today here in the office. Please do not hesitate to give [place] a call back if you have any further questions. [place], BSN     Nitroglycerin sublingual tablets What is this medicine? NITROGLYCERIN (nye troe GLI ser in) is a type of vasodilator. It relaxes blood vessels, increasing the blood and oxygen supply to your heart. This medicine is used to relieve chest pain caused by angina. It is also used to prevent chest pain before activities like climbing stairs, going outdoors in cold weather, or sexual activity. This medicine may be used for other purposes; ask your health care provider or pharmacist if you have questions. COMMON BRAND NAME(S): Nitroquick, Nitrostat, Nitrotab What should I tell my health care provider before I take this medicine? They need to know if you have any of these conditions: -anemia -head injury, recent stroke, or bleeding in the brain -liver disease -previous heart attack -an unusual or allergic reaction to nitroglycerin, other medicines, foods, dyes, or preservatives -pregnant or trying to get pregnant -breast-feeding How should I use this medicine? Take this medicine  by mouth as needed. At the first sign of an angina attack (chest pain or tightness) place one tablet under your tongue. You can also take this medicine 5 to 10 minutes before an event likely to produce chest pain. Follow the directions on the prescription label. Let the tablet dissolve under the tongue. Do not swallow whole. Replace the dose if you accidentally swallow it. It will help if your mouth is not dry. Saliva around the tablet will help it to dissolve more quickly. Do not eat or drink, smoke or chew tobacco while a tablet is dissolving. If you are not better within 5 minutes after taking ONE dose of nitroglycerin, call 9-1-1 immediately to seek emergency medical care. Do not take more than 3 nitroglycerin tablets over 15 minutes. If you take this medicine often to relieve symptoms of angina, your doctor or health care professional may provide you with different instructions to manage your symptoms. If symptoms do not go away after following these instructions, it is important to call 9-1-1 immediately. Do not take more than 3 nitroglycerin tablets over 15 minutes. Talk to your pediatrician regarding the use of this medicine in children. Special care may be needed. Overdosage: If you think you have taken too much of this medicine contact a poison control center or emergency room at once. NOTE: This medicine is only for you. Do not share this medicine with others. What if I miss a dose? This does not apply. This medicine is only used as needed. What may interact with this medicine? Do not take this medicine with any of the following  medications: -certain migraine medicines like ergotamine and dihydroergotamine (DHE) -medicines used to treat erectile dysfunction like sildenafil, tadalafil, and vardenafil -riociguat This medicine may also interact with the following medications: -alteplase -aspirin -heparin -medicines for high blood pressure -medicines for mental depression -other medicines  used to treat angina -phenothiazines like chlorpromazine, mesoridazine, prochlorperazine, thioridazine This list may not describe all possible interactions. Give your health care provider a list of all the medicines, herbs, non-prescription drugs, or dietary supplements you use. Also tell them if you smoke, drink alcohol, or use illegal drugs. Some items may interact with your medicine. What should I watch for while using this medicine? Tell your doctor or health care professional if you feel your medicine is no longer working. Keep this medicine with you at all times. Sit or lie down when you take your medicine to prevent falling if you feel dizzy or faint after using it. Try to remain calm. This will help you to feel better faster. If you feel dizzy, take several deep breaths and lie down with your feet propped up, or bend forward with your head resting between your knees. You may get drowsy or dizzy. Do not drive, use machinery, or do anything that needs mental alertness until you know how this drug affects you. Do not stand or sit up quickly, especially if you are an older patient. This reduces the risk of dizzy or fainting spells. Alcohol can make you more drowsy and dizzy. Avoid alcoholic drinks. Do not treat yourself for coughs, colds, or pain while you are taking this medicine without asking your doctor or health care professional for advice. Some ingredients may increase your blood pressure. What side effects may I notice from receiving this medicine? Side effects that you should report to your doctor or health care professional as soon as possible: -blurred vision -dry mouth -skin rash -sweating -the feeling of extreme pressure in the head -unusually weak or tired Side effects that usually do not require medical attention (report to your doctor or health care professional if they continue or are bothersome): -flushing of the face or neck -headache -irregular heartbeat,  palpitations -nausea, vomiting This list may not describe all possible side effects. Call your doctor for medical advice about side effects. You may report side effects to FDA at 1-800-FDA-1088. Where should I keep my medicine? Keep out of the reach of children. Store at room temperature between 20 and 25 degrees C (68 and 77 degrees F). Store in [employer]. Protect from light and moisture. Keep tightly closed. Throw away any unused medicine after the expiration date. NOTE: This sheet is a summary. It may not cover all possible information. If you have questions about this medicine, talk to your doctor, pharmacist, or health care provider.  2017 Elsevier/[employer] (2012-12-17 17:57:36) Lipid Profile Test Why am I having this test? The lipid profile test gives results that can help predict the likelihood of developing heart disease. The test is also used to monitor treatment for high cholesterol to see if you are reaching your goals. A lipid profile measures the following:  Total cholesterol. Cholesterol is a waxy fat in your blood. If your total cholesterol is elevated, this can increase your risk of coronary heart disease.  High-density lipoprotein (HDL). This is known as the good cholesterol. Having a high level of HDL is good. Your HDL level may be low if you smoke or do not get enough exercise.  Low-density lipoprotein (LDL). This is known as the bad cholesterol and  is responsible for the formation of plaque in the arteries. Having a low level of LDL is best.  Cholesterol to HDL ratio. This is calculated by dividing the total cholesterol by the HDL cholesterol. The ratio is used by health care providers for determining your risk of heart disease. A low ratio is best.  Triglycerides. These are a type of fat in the blood responsible for providing energy to your cells. Low levels are best. What kind of sample is taken? A blood sample is required for this test. It is usually  collected by inserting a needle into a vein. How do I prepare for this test? Do not eat or drink anything after midnight on the night before the test or as directed by your health care provider. What are the reference ranges? Reference ranges are considered healthy ranges established after testing a large group of healthy people. Reference ranges may vary among different people, labs, and hospitals. It is your responsibility to obtain your test results. Ask the lab or department performing the test when and how you will get your results. Reference ranges for the lipid profile test are as follows: Total Cholesterol  Adult or elderly: less than 200 mg/dL or less than 5.20 mmol/L (SI units).  Child: 120-200 mg/dL.  Infant: 70-175 mg/dL.  Newborns: 53-135 mg/dL. HDL  Male: greater than 45 mg/dL or greater than 0.75 mmol/L (SI units).  Male: greater than 55 mg/dL or greater than 0.91 mmol/L (SI units). HDL reference values based on risk of heart disease:  For low risk of heart disease:  Male: 60 mg/dL or 1.55 mmol/L.  Male: 70 mg/dL or 1.81 mmol/L.  For moderate risk of heart disease:  Male: 45 mg/dL or 1.17 mmol/L.  Male: 55 mg/dL or 1.42 mmol/L.  For high risk of heart disease:  Male: 25 mg/dL or 0.65 mmol/L.  Male: 35 mg/dL or 0.90 mmol/L. LDL  Adult: less than 130 mg/dL.  Children: less than 110 mg/dL. Cholesterol to HDL Ratio  Reference values based on risk for coronary heart disease:  Risk that is one half average:  Male: 3.4.  Male: 3.3.  Average risk:  Male: 5.0.  Male: 4.4.  Risk that is two times average (moderate risk):  Male: 10.0.  Male: 7.0.  Risk that is three times average (high risk):    Male: 11.0. Triglycerides  Adult or elderly:  Male: 40-160 mg/dL or 0.45-1.81 mmol/L (SI units).  Male: 35-135 mg/dL or 0.40-1.52 mmol/L (SI units).  Children 62-58 years old:  Male: 30-86 mg/dL.  Male: 32-99  mg/dL.  Children 9-9 years old:  Male: 31-108 mg/dL.  Male: 35-114 mg/dL.  Children 66-41 years old:  Male: 36-138 mg/dL.  Male: 41-138 mg/dL.  Children 14-73 years old:  Male: 40-163 mg/dL.  Male: 40-128 mg/dL. Triglycerides should be less than 400 mg/dL even when you are not fasting. What do the results mean? Talk with your health care provider to discuss your results, treatment options, and if necessary, the need for more tests. Talk with your health care provider if you have any questions about your results. Talk with your health care provider to discuss your results, treatment options, and if necessary, the need for more tests. Talk with your health care provider if you have any questions about your results. This information is not intended to replace advice given to you by your health care provider. Make sure you discuss any questions you have with your health care provider. Document Released: 03/14/2004 Document Revised: 10/25/2015  Document Reviewed: 06/10/2013 Elsevier Interactive Patient Education  2017 [employer].

## 2016-01-23 NOTE — Progress Notes (Signed)
Cardiology Office Note   Date:  01/23/2016   ID:  Brett Riley, DOB [birth year], MRN [id number]  Referring Doctor:  Einar Pheasant, MD Dr. Lucky Cowboy  Cardiologist:   Wende Bushy, MD   Reason for consultation:  Chief Complaint  Patient presents with  . other    S/p cardiac cath and discuss abn stress test. Meds reviewed verbally with pt.   Preoperative evaluation prior to left carotid endarterectomy   History of Present Illness: Brett Riley is a 80 y.o. male who presents for preop operative cardiac evaluation prior to left CEA, Status post left heart catheterization  Patient denies chest pain or significant shortness breath. No passing out, palpitations.  ROS:  Please see the history of present illness. Aside from mentioned under HPI, all other systems are reviewed and negative.     Past Medical History:  Diagnosis Date  . Hypercholesterolemia   . Hypertension     Past Surgical History:  Procedure Laterality Date  . CARDIAC CATHETERIZATION N/A 01/19/2016   Procedure: Left Heart Cath and Coronary Angiography;  Surgeon: Wellington Hampshire, MD;  Location: [facility];  Service: Cardiovascular;  Laterality: N/A;  . NO PAST SURGERIES       reports that he has never smoked. He has never used smokeless tobacco. He reports that he does not drink alcohol or use drugs.   family history includes Breast cancer in his sister; Heart attack in his father; Heart disease in his father; Stroke in his mother.   Outpatient Medications Prior to Visit  Medication Sig Dispense Refill  . aspirin 81 MG tablet Take 81 mg by mouth daily.    . metoprolol succinate (TOPROL-XL) 100 MG 24 hr tablet TAKE 1 TABLET EVERY DAY WITH A MEAL 90 tablet 3  . Multiple Vitamins-Iron (MULTIVITAMINS WITH IRON) TABS Take 1 tablet by mouth daily.    . hydrochlorothiazide (HYDRODIURIL) 25 MG tablet Take 1 tablet (25 mg total) by mouth daily. 90 tablet 3  . simvastatin (ZOCOR) 20 MG tablet Take 1 tablet  (20 mg total) by mouth every evening. 90 tablet 3   No facility-administered medications prior to visit.      Allergies: Patient has no known allergies.    PHYSICAL EXAM: VS:  BP 120/70 (BP Location: Left Arm, Patient Position: Sitting, Cuff Size: Normal)   Pulse 63   Ht 6' (1.829 m)   Wt 204 lb 12 oz (92.9 kg)   BMI 27.77 kg/m  , Body mass index is 27.77 kg/m. Wt Readings from Last 3 Encounters:  01/23/16 204 lb 12 oz (92.9 kg)  01/19/16 207 lb (93.9 kg)  01/11/16 207 lb 8 oz (94.1 kg)    GENERAL:  well developed, well nourished, not in acute distress HEENT: normocephalic, pink conjunctivae, anicteric sclerae, no xanthelasma, normal dentition, oropharynx clear NECK:  no neck vein engorgement, JVP normal, no hepatojugular reflux, carotid upstroke brisk and symmetric, soft left carotid bruit, no thyromegaly, no lymphadenopathy LUNGS:  good respiratory effort, clear to auscultation bilaterally CV:  PMI not displaced, no thrills, no lifts, S1 and S2 within normal limits, no palpable S3 or S4, no murmurs, no rubs, no gallops ABD:  Soft, nontender, nondistended, normoactive bowel sounds, no abdominal aortic bruit, no hepatomegaly, no splenomegaly MS: nontender back, no kyphosis, no scoliosis, no joint deformities EXT:  2+ DP/PT pulses, no edema, no varicosities, no cyanosis, no clubbing SKIN: warm, nondiaphoretic, normal turgor, no ulcers NEUROPSYCH: alert, oriented to person, place, and time,  sensory/motor grossly intact, normal mood, appropriate affect  Recent Labs: 09/07/2015: ALT 18 01/18/2016: BUN 25; Creatinine, Ser 0.98; Hemoglobin 14.2; Platelets 134; Potassium 3.8; Sodium 140   Lipid Panel    Component Value Date/Time   CHOL 141 09/07/2015 0906   TRIG 88.0 09/07/2015 0906   HDL 41.40 09/07/2015 0906   CHOLHDL 3 09/07/2015 0906   VLDL 17.6 09/07/2015 0906   LDLCALC 82 09/07/2015 0906     Other studies Reviewed:  EKG:  The ekg from 01/11/2016 was personally reviewed  by me and it revealed sinus rhythm, 78 BPM.  Additional studies/ records that were reviewed personally reviewed by me today include:  Nuclear stress is 01/18/2016:  Note of 1-2 mm slow upsloping ST depression during peak exercise.  Nuclear imaging revealed evidence of ischemia as described below   Test demonstrated moderately impaired functional capacity. Patient achieved 4.6 METs and reached 101% of maximum predicted heart rate. There was note of exaggerated heart rate response to exercise. No chest pain. Note of 1-2 mm slow upsloping ST depression during peak exercise. No significant arrhythmias.  Nuclear imaging revealed the following with attenuation correction: 1. Small sized defect involving the apical to basal inferior walls. The degree of photon reduction was mild. This was reversible at rest. This is suggestive of ischemia. 2. Small to medium sized defect involving the apical lateral, mid anterolateral and mid inferolateral walls. The degree photon reduction was mild. This appears reversible at rest. This is suggestive of ischemia  Gated SPECT imaging reveals normal wall motion and thickening with a calculated left ventricular ejection fraction of 57%  Left heart cath 01/19/2016:  The left ventricular systolic function is normal.  LV end diastolic pressure is mildly elevated.  The left ventricular ejection fraction is 55-65% by visual estimate.  Mid RCA lesion, 20 %stenosed.  RPDA lesion, 90 %stenosed.  Ost LM to LM lesion, 40 %stenosed.  Dist LAD lesion, 20 %stenosed.  1st Diag lesion, 95 %stenosed.  Prox LAD to Mid LAD lesion, 70 %stenosed.   1. Significant 2 vessel coronary artery disease. Severe disease involving the mid to distal right PDA and first diagonal. These are not optimal for PCI and risks probably outweigh the benefit. There is moderate left main stenosis and borderline significant disease involving the proximal to mid LAD which is heavily  calcified. 2. Normal LV systolic function and mildly elevated left ventricular end-diastolic pressure.  Recommendations: In the absence of clear anginal symptoms, I recommend aggressive medical therapy. If the patient becomes symptomatic, CABG can be considered given that he has diffuse heavily calcified disease. In terms of preoperative cardiovascular risk, he is considered at moderate risk for carotid endarterectomy. Consider switching to a more potent statin and recommend aggressive treatment of risk factors.    ASSESSMENT AND PLAN:  Preoperative cardiac evaluation prior to left CEA Possibly some shortness of breath Hypertension Hyperlipidemia CAD, two-vessel disease, 40% left main disease  Not clinically significantly symptomatic. We have discussed in detail the results of the left heart catheterization. Recommend optimizing medical therapy. Discussed discontinuing hydrochlorothiazide and starting Imdur ER 30 mg by mouth daily. Switching simvastatin to rosuvastatin 10 mg by mouth daily at bedtime. Repeat fasting lipid panel and LFTs in 3 months time. Nitroglycerin sublingual when necessary for chest pain. Risk factor modification recommended. Lifestyle changes recommended. In the event of any onset of shortness of breath, chest pain, fatigue, chest heaviness, we will refer to cardiothoracic surgery for consideration for CABG. Recommended LDL goal is less than  70.  In terms of preoperative cardiac evaluation, patient is moderate to high cardiac risk. No indication for further cardiac testing at this point. In the event that patient will need to proceed with CABG, it is likely more appropriate to have carotid endarterectomy to be done before that.    Current medicines are reviewed at length with the patient today.  The patient does not have concerns regarding medicines.  Labs/ tests ordered today include:  Orders Placed This Encounter  Procedures  . Lipid Profile  . Hepatic  function panel  . EKG 12-Lead    I had a lengthy and detailed discussion with the patient regarding diagnoses, prognosis, diagnostic options, treatment options , and side effects of medications.   I counseled the patient on importance of lifestyle modification including heart healthy diet, regular physical activity  , and smoking cessation.   Disposition:   FU with undersigned In 3 months  I spent at least 40 minutes with the patient today and more than 50% of the time was spent counseling the patient and coordinating care.    Signed, Wende Bushy, MD  01/23/2016 5:00 PM    [place]  This note was generated in part with voice recognition software and I apologize for any typographical errors that were not detected and corrected.

## 2016-01-24 ENCOUNTER — Telehealth: Payer: Self-pay | Admitting: Cardiology

## 2016-01-24 ENCOUNTER — Encounter (INDEPENDENT_AMBULATORY_CARE_PROVIDER_SITE_OTHER): Payer: Self-pay

## 2016-01-24 NOTE — Telephone Encounter (Addendum)
Advised pt wife, Lannette Donath, to have pt keep 12/7 echo appt as noted at 11/9 OV. He is scheduled 12/14 for carotid endarterectomy w/Dr. Lucky Cowboy. She verbalized understanding with no further questions at this time.

## 2016-01-24 NOTE — Telephone Encounter (Signed)
Pt wife called, states pt is having carotid surgery on 12/14 by Dr. Lucky Cowboy. She asks if pt still needs to have echocardiogram. Please call and advise.

## 2016-01-30 ENCOUNTER — Other Ambulatory Visit (INDEPENDENT_AMBULATORY_CARE_PROVIDER_SITE_OTHER): Payer: Self-pay | Admitting: Vascular Surgery

## 2016-02-08 ENCOUNTER — Ambulatory Visit (INDEPENDENT_AMBULATORY_CARE_PROVIDER_SITE_OTHER): Payer: Commercial Managed Care - HMO

## 2016-02-08 ENCOUNTER — Inpatient Hospital Stay: Admission: RE | Admit: 2016-02-08 | Payer: Commercial Managed Care - HMO | Source: Ambulatory Visit

## 2016-02-08 ENCOUNTER — Other Ambulatory Visit: Payer: Self-pay

## 2016-02-08 DIAGNOSIS — R0602 Shortness of breath: Secondary | ICD-10-CM

## 2016-02-08 DIAGNOSIS — Z01810 Encounter for preprocedural cardiovascular examination: Secondary | ICD-10-CM

## 2016-02-13 ENCOUNTER — Other Ambulatory Visit: Payer: Commercial Managed Care - HMO

## 2016-02-13 ENCOUNTER — Encounter
Admission: RE | Admit: 2016-02-13 | Discharge: 2016-02-13 | Disposition: A | Discharge status: Home / Self Care | Payer: Commercial Managed Care - HMO | Source: Ambulatory Visit | Attending: Vascular Surgery | Admitting: Vascular Surgery

## 2016-02-13 DIAGNOSIS — E7800 Pure hypercholesterolemia, unspecified: Secondary | ICD-10-CM | POA: Diagnosis present

## 2016-02-13 DIAGNOSIS — R5383 Other fatigue: Secondary | ICD-10-CM | POA: Insufficient documentation

## 2016-02-13 DIAGNOSIS — Z01812 Encounter for preprocedural laboratory examination: Secondary | ICD-10-CM

## 2016-02-13 DIAGNOSIS — M799 Soft tissue disorder, unspecified: Secondary | ICD-10-CM

## 2016-02-13 DIAGNOSIS — I6523 Occlusion and stenosis of bilateral carotid arteries: Secondary | ICD-10-CM | POA: Diagnosis present

## 2016-02-13 DIAGNOSIS — I10 Essential (primary) hypertension: Secondary | ICD-10-CM

## 2016-02-13 DIAGNOSIS — D649 Anemia, unspecified: Secondary | ICD-10-CM | POA: Insufficient documentation

## 2016-02-13 DIAGNOSIS — I2510 Atherosclerotic heart disease of native coronary artery without angina pectoris: Secondary | ICD-10-CM | POA: Diagnosis present

## 2016-02-13 DIAGNOSIS — D696 Thrombocytopenia, unspecified: Secondary | ICD-10-CM

## 2016-02-13 DIAGNOSIS — I6529 Occlusion and stenosis of unspecified carotid artery: Secondary | ICD-10-CM | POA: Insufficient documentation

## 2016-02-13 DIAGNOSIS — Z23 Encounter for immunization: Secondary | ICD-10-CM | POA: Diagnosis present

## 2016-02-13 DIAGNOSIS — I6522 Occlusion and stenosis of left carotid artery: Secondary | ICD-10-CM | POA: Diagnosis present

## 2016-02-13 HISTORY — DX: Atherosclerotic heart disease of native coronary artery without angina pectoris: I25.10

## 2016-02-13 LAB — BASIC METABOLIC PANEL WITH GFR
Anion gap: 6 (ref 5–15)
BUN: 22 mg/dL — ABNORMAL HIGH (ref 6–20)
CO2: 29 mmol/L (ref 22–32)
Calcium: 9.4 mg/dL (ref 8.9–10.3)
Chloride: 103 mmol/L (ref 101–111)
Creatinine, Ser: 0.83 mg/dL (ref 0.61–1.24)
GFR calc Af Amer: >60 mL/min
GFR calc non Af Amer: >60 mL/min
Glucose, Bld: 70 mg/dL (ref 65–99)
Potassium: 3.8 mmol/L (ref 3.5–5.1)
Sodium: 138 mmol/L (ref 135–145)

## 2016-02-13 LAB — SURGICAL PCR SCREEN
MRSA, PCR: NEGATIVE
Staphylococcus aureus: POSITIVE — AB

## 2016-02-13 LAB — TYPE AND SCREEN
ABO/RH(D): A POS
Antibody Screen: NEGATIVE

## 2016-02-13 LAB — CBC WITH DIFFERENTIAL/PLATELET
Basophils Absolute: 0 10*3/uL (ref 0–0.1)
Basophils Relative: 1 %
Eosinophils Absolute: 0.1 10*3/uL (ref 0–0.7)
Eosinophils Relative: 3 %
HCT: 37 % — ABNORMAL LOW (ref 40.0–52.0)
Hemoglobin: 13 g/dL (ref 13.0–18.0)
Lymphocytes Relative: 34 %
Lymphs Abs: 1.3 10*3/uL (ref 1.0–3.6)
MCH: 33 pg (ref 26.0–34.0)
MCHC: 35.1 g/dL (ref 32.0–36.0)
MCV: 93.9 fL (ref 80.0–100.0)
Monocytes Absolute: 0.4 10*3/uL (ref 0.2–1.0)
Monocytes Relative: 10 %
Neutro Abs: 2 10*3/uL (ref 1.4–6.5)
Neutrophils Relative %: 52 %
Platelets: 136 10*3/uL — ABNORMAL LOW (ref 150–440)
RBC: 3.94 MIL/uL — ABNORMAL LOW (ref 4.40–5.90)
RDW: 13.6 % (ref 11.5–14.5)
WBC: 3.8 10*3/uL (ref 3.8–10.6)

## 2016-02-13 LAB — PROTIME-INR
INR: 1.08
Prothrombin Time: 14 s (ref 11.4–15.2)

## 2016-02-13 LAB — APTT: aPTT: 27 s (ref 24–36)

## 2016-02-13 NOTE — Pre-Procedure Instructions (Signed)
Patient cleared by cardiologist, moderate risk. Note in [facility] 01/23/16

## 2016-02-13 NOTE — Patient Instructions (Signed)
Your procedure is scheduled on: Thursday 02/15/16 Report to [facility]. 2ND FLOOR MEDICAL MALL ENTRANCE. To find out your arrival time please call [phone] between 1PM - 3PM on Wednesday 02/14/16.  Remember: Instructions that are not followed completely may result in serious medical risk, up to and including death, or upon the discretion of your surgeon and anesthesiologist your surgery may need to be rescheduled.    __X__ 1. Do not eat food or drink liquids after midnight. No gum chewing or hard candies.     __X__ 2. No Alcohol for 24 hours before or after surgery.   ____ 3. Bring all medications with you on the day of surgery if instructed.    __X__ 4. Notify your doctor if there is any change in your medical condition     (cold, fever, infections).             ___X__5. No smoking within 24 hours of your surgery.     Do not wear jewelry, make-up, hairpins, clips or nail polish.  Do not wear lotions, powders, or perfumes.   Do not shave 48 hours prior to surgery. Men may shave face and neck.  Do not bring valuables to the hospital.    [facility] is not responsible for any belongings or valuables.               Contacts, dentures or bridgework may not be worn into surgery.  Leave your suitcase in the car. After surgery it may be brought to your room.  For patients admitted to the hospital, discharge time is determined by your                treatment team.   Patients discharged the day of surgery will not be allowed to drive home.   Please read over the following fact sheets that you were given:   Pain Booklet and MRSA Information   __X__ Take these medicines the morning of surgery with A SIP OF WATER:    1. ISOSORBIDE MONONITRATE  2. METOPROLOL  3.   4.  5.  6.  ____ Fleet Enema (as directed)   __X__ Use CHG Soap as directed  ____ Use inhalers on the day of surgery  ____ Stop metformin 2 days prior to surgery    ____ Take 1/2 of usual insulin dose the night  before surgery and none on the morning of surgery.   ____ Stop Coumadin/Plavix/aspirin on   __X__ Stop Anti-inflammatories such as Advil, Aleve, Ibuprofen, Motrin, Naproxen, Naprosyn, Goodies,powder, or aspirin products.  OK to take Tylenol.   ____ Stop supplements until after surgery.    ____ Bring C-Pap to the hospital.

## 2016-02-15 ENCOUNTER — Inpatient Hospital Stay: Payer: Commercial Managed Care - HMO | Admitting: Certified Registered Nurse Anesthetist

## 2016-02-15 ENCOUNTER — Encounter
Admission: RE | Disposition: A | Discharge status: Home / Self Care | Payer: Self-pay | Source: Ambulatory Visit | Attending: Vascular Surgery

## 2016-02-15 ENCOUNTER — Encounter: Payer: Self-pay | Admitting: Anesthesiology

## 2016-02-15 ENCOUNTER — Inpatient Hospital Stay
Admission: RE | Admit: 2016-02-15 | Discharge: 2016-02-16 | DRG: 039 | Disposition: A | Discharge status: Home / Self Care | Payer: Commercial Managed Care - HMO | Source: Ambulatory Visit | Attending: Vascular Surgery | Admitting: Vascular Surgery

## 2016-02-15 DIAGNOSIS — Z23 Encounter for immunization: Secondary | ICD-10-CM | POA: Diagnosis present

## 2016-02-15 DIAGNOSIS — D649 Anemia, unspecified: Secondary | ICD-10-CM | POA: Diagnosis present

## 2016-02-15 DIAGNOSIS — I6523 Occlusion and stenosis of bilateral carotid arteries: Principal | ICD-10-CM | POA: Diagnosis present

## 2016-02-15 DIAGNOSIS — I10 Essential (primary) hypertension: Secondary | ICD-10-CM | POA: Diagnosis present

## 2016-02-15 DIAGNOSIS — I2510 Atherosclerotic heart disease of native coronary artery without angina pectoris: Secondary | ICD-10-CM | POA: Diagnosis present

## 2016-02-15 DIAGNOSIS — I6522 Occlusion and stenosis of left carotid artery: Secondary | ICD-10-CM | POA: Diagnosis present

## 2016-02-15 DIAGNOSIS — E7800 Pure hypercholesterolemia, unspecified: Secondary | ICD-10-CM | POA: Diagnosis present

## 2016-02-15 HISTORY — PX: ENDARTERECTOMY: SHX5162

## 2016-02-15 LAB — ABO/RH: ABO/RH(D): A POS

## 2016-02-15 SURGERY — ENDARTERECTOMY CAROTID
Anesthesia: General | Site: Neck | Laterality: Left | Wound class: Clean

## 2016-02-15 MED ORDER — LIDOCAINE HCL 1 % IJ SOLN
INTRAMUSCULAR | Status: DC | PRN
  Administered 2016-02-15: 10 mL

## 2016-02-15 MED ORDER — DOCUSATE SODIUM 100 MG PO CAPS
100.0000 mg | ORAL_CAPSULE | Freq: Every day | ORAL | Status: DC
  Administered 2016-02-16: 100 mg via ORAL
  Filled 2016-02-15: qty 1

## 2016-02-15 MED ORDER — HYDRALAZINE HCL 20 MG/ML IJ SOLN: 5.0000 mg | INTRAMUSCULAR | Status: DC | PRN

## 2016-02-15 MED ORDER — ONDANSETRON HCL 4 MG/2ML IJ SOLN
INTRAMUSCULAR | Status: DC | PRN
  Administered 2016-02-15: 4 mg via INTRAVENOUS

## 2016-02-15 MED ORDER — ACETAMINOPHEN 650 MG RE SUPP: 325.0000 mg | RECTAL | Status: DC | PRN

## 2016-02-15 MED ORDER — ONDANSETRON HCL 4 MG/2ML IJ SOLN: 4.0000 mg | Freq: Once | INTRAMUSCULAR | Status: DC | PRN

## 2016-02-15 MED ORDER — SODIUM CHLORIDE 0.9 % IV SOLN
INTRAVENOUS | Status: DC | PRN
  Administered 2016-02-15: 50 mL via INTRAMUSCULAR

## 2016-02-15 MED ORDER — NITROGLYCERIN 0.4 MG SL SUBL: 0.4000 mg | SUBLINGUAL_TABLET | SUBLINGUAL | Status: DC | PRN

## 2016-02-15 MED ORDER — ISOSORBIDE MONONITRATE ER 30 MG PO TB24
30.0000 mg | ORAL_TABLET | Freq: Every day | ORAL | Status: DC
  Administered 2016-02-16: 30 mg via ORAL
  Filled 2016-02-15: qty 1

## 2016-02-15 MED ORDER — CEFAZOLIN SODIUM-DEXTROSE 2-4 GM/100ML-% IV SOLN
INTRAVENOUS | Status: AC
  Filled 2016-02-15: qty 100

## 2016-02-15 MED ORDER — CHLORHEXIDINE GLUCONATE CLOTH 2 % EX PADS: 6.0000 | MEDICATED_PAD | Freq: Once | CUTANEOUS | Status: DC

## 2016-02-15 MED ORDER — GUAIFENESIN-DM 100-10 MG/5ML PO SYRP: 15.0000 mL | ORAL_SOLUTION | ORAL | Status: DC | PRN

## 2016-02-15 MED ORDER — SODIUM CHLORIDE FLUSH 0.9 % IV SOLN
INTRAVENOUS | Status: AC
  Administered 2016-02-15: 15:00:00
  Filled 2016-02-15: qty 3

## 2016-02-15 MED ORDER — EVICEL 2 ML EX KIT
PACK | CUTANEOUS | Status: AC
  Filled 2016-02-15: qty 1

## 2016-02-15 MED ORDER — SODIUM CHLORIDE 0.9 % IV SOLN
INTRAVENOUS | Status: DC | PRN
  Administered 2016-02-15: 50 mL

## 2016-02-15 MED ORDER — LABETALOL HCL 5 MG/ML IV SOLN: 10.0000 mg | INTRAVENOUS | Status: DC | PRN

## 2016-02-15 MED ORDER — LIDOCAINE HCL (PF) 1 % IJ SOLN
INTRAMUSCULAR | Status: AC
  Filled 2016-02-15: qty 30

## 2016-02-15 MED ORDER — INFLUENZA VAC SPLIT QUAD 0.5 ML IM SUSY
0.5000 mL | PREFILLED_SYRINGE | INTRAMUSCULAR | Status: AC
  Administered 2016-02-16: 0.5 mL via INTRAMUSCULAR
  Filled 2016-02-15: qty 0.5

## 2016-02-15 MED ORDER — ACETAMINOPHEN 325 MG PO TABS: 325.0000 mg | ORAL_TABLET | ORAL | Status: DC | PRN

## 2016-02-15 MED ORDER — MAGNESIUM SULFATE 2 GM/50ML IV SOLN
2.0000 g | Freq: Every day | INTRAVENOUS | Status: DC | PRN
  Filled 2016-02-15: qty 50

## 2016-02-15 MED ORDER — POTASSIUM CHLORIDE CRYS ER 20 MEQ PO TBCR: 20.0000 meq | EXTENDED_RELEASE_TABLET | Freq: Every day | ORAL | Status: DC | PRN

## 2016-02-15 MED ORDER — FENTANYL CITRATE (PF) 100 MCG/2ML IJ SOLN
INTRAMUSCULAR | Status: DC | PRN
  Administered 2016-02-15 (×4): 50 ug via INTRAVENOUS

## 2016-02-15 MED ORDER — LACTATED RINGERS IV SOLN
INTRAVENOUS | Status: DC
  Administered 2016-02-15: 12:00:00 via INTRAVENOUS

## 2016-02-15 MED ORDER — ROCURONIUM BROMIDE 100 MG/10ML IV SOLN
INTRAVENOUS | Status: DC | PRN
  Administered 2016-02-15: 20 mg via INTRAVENOUS
  Administered 2016-02-15: 50 mg via INTRAVENOUS

## 2016-02-15 MED ORDER — FENTANYL CITRATE (PF) 100 MCG/2ML IJ SOLN: 25.0000 ug | INTRAMUSCULAR | Status: DC | PRN

## 2016-02-15 MED ORDER — CEFAZOLIN SODIUM 1 G IJ SOLR
INTRAMUSCULAR | Status: AC
  Filled 2016-02-15: qty 10

## 2016-02-15 MED ORDER — DOPAMINE-DEXTROSE 3.2-5 MG/ML-% IV SOLN: 3.0000 ug/kg/min | INTRAVENOUS | Status: DC

## 2016-02-15 MED ORDER — SUGAMMADEX SODIUM 200 MG/2ML IV SOLN
INTRAVENOUS | Status: DC | PRN
  Administered 2016-02-15: 200 mg via INTRAVENOUS

## 2016-02-15 MED ORDER — NITROGLYCERIN IN D5W 200-5 MCG/ML-% IV SOLN: 5.0000 ug/min | INTRAVENOUS | Status: DC

## 2016-02-15 MED ORDER — PROPOFOL 10 MG/ML IV BOLUS
INTRAVENOUS | Status: DC | PRN
  Administered 2016-02-15: 200 mg via INTRAVENOUS

## 2016-02-15 MED ORDER — CLOPIDOGREL BISULFATE 75 MG PO TABS
75.0000 mg | ORAL_TABLET | Freq: Every day | ORAL | Status: DC
  Administered 2016-02-16: 75 mg via ORAL
  Filled 2016-02-15: qty 1

## 2016-02-15 MED ORDER — FAMOTIDINE 20 MG PO TABS
20.0000 mg | ORAL_TABLET | Freq: Once | ORAL | Status: AC
  Administered 2016-02-15: 20 mg via ORAL

## 2016-02-15 MED ORDER — ONDANSETRON HCL 4 MG/2ML IJ SOLN: 4.0000 mg | Freq: Four times a day (QID) | INTRAMUSCULAR | Status: DC | PRN

## 2016-02-15 MED ORDER — SODIUM CHLORIDE 0.9 % IV SOLN
INTRAVENOUS | Status: DC | PRN
  Administered 2016-02-15: 50 ug/min via INTRAVENOUS

## 2016-02-15 MED ORDER — MORPHINE SULFATE (PF) 4 MG/ML IV SOLN
2.0000 mg | INTRAVENOUS | Status: DC | PRN
  Administered 2016-02-15: 1 mg via INTRAVENOUS
  Administered 2016-02-15: 4 mg via INTRAVENOUS
  Administered 2016-02-16: 1 mg via INTRAVENOUS
  Filled 2016-02-15 (×3): qty 1

## 2016-02-15 MED ORDER — LACTATED RINGERS IV SOLN
INTRAVENOUS | Status: DC | PRN
  Administered 2016-02-15: 13:00:00 via INTRAVENOUS

## 2016-02-15 MED ORDER — FAMOTIDINE 20 MG PO TABS
ORAL_TABLET | ORAL | Status: AC
  Filled 2016-02-15: qty 1

## 2016-02-15 MED ORDER — NITROGLYCERIN IN D5W 200-5 MCG/ML-% IV SOLN
INTRAVENOUS | Status: AC
  Filled 2016-02-15: qty 250

## 2016-02-15 MED ORDER — HEPARIN SODIUM (PORCINE) 1000 UNIT/ML IJ SOLN
INTRAMUSCULAR | Status: AC
  Filled 2016-02-15: qty 1

## 2016-02-15 MED ORDER — ROSUVASTATIN CALCIUM 20 MG PO TABS
10.0000 mg | ORAL_TABLET | Freq: Every day | ORAL | Status: DC
  Administered 2016-02-15: 10 mg via ORAL
  Filled 2016-02-15: qty 1

## 2016-02-15 MED ORDER — OXYCODONE-ACETAMINOPHEN 5-325 MG PO TABS
1.0000 | ORAL_TABLET | ORAL | Status: DC | PRN
  Administered 2016-02-16: 2 via ORAL
  Filled 2016-02-15: qty 2

## 2016-02-15 MED ORDER — ASPIRIN EC 81 MG PO TBEC
81.0000 mg | DELAYED_RELEASE_TABLET | Freq: Every day | ORAL | Status: DC
  Administered 2016-02-15: 81 mg via ORAL
  Filled 2016-02-15: qty 1

## 2016-02-15 MED ORDER — METOPROLOL SUCCINATE ER 100 MG PO TB24: 100.0000 mg | ORAL_TABLET | Freq: Every day | ORAL | Status: DC

## 2016-02-15 MED ORDER — DEXTROSE 5 % IV SOLN
1.5000 g | Freq: Two times a day (BID) | INTRAVENOUS | Status: DC
  Administered 2016-02-16: 1.5 g via INTRAVENOUS
  Filled 2016-02-15 (×2): qty 1.5

## 2016-02-15 MED ORDER — NITROGLYCERIN 0.2 MG/ML ON CALL CATH LAB
INTRAVENOUS | Status: DC | PRN
  Administered 2016-02-15 (×2): 20 ug via INTRAVENOUS

## 2016-02-15 MED ORDER — HEPARIN SODIUM (PORCINE) 1000 UNIT/ML IJ SOLN
INTRAMUSCULAR | Status: DC | PRN
  Administered 2016-02-15: 6000 [IU] via INTRAVENOUS

## 2016-02-15 MED ORDER — PHENOL 1.4 % MT LIQD
1.0000 | OROMUCOSAL | Status: DC | PRN
  Filled 2016-02-15: qty 177

## 2016-02-15 MED ORDER — EPHEDRINE SULFATE 50 MG/ML IJ SOLN
INTRAMUSCULAR | Status: DC | PRN
  Administered 2016-02-15: 10 mg via INTRAVENOUS
  Administered 2016-02-15: 5 mg via INTRAVENOUS

## 2016-02-15 MED ORDER — TAB-A-VITE/IRON PO TABS
1.0000 | ORAL_TABLET | Freq: Every day | ORAL | Status: DC
  Administered 2016-02-16: 1 via ORAL
  Filled 2016-02-15 (×2): qty 1

## 2016-02-15 MED ORDER — SODIUM CHLORIDE 0.9 % IV SOLN
INTRAVENOUS | Status: DC
  Administered 2016-02-15: 75 mL/h via INTRAVENOUS
  Administered 2016-02-15: 21:00:00 via INTRAVENOUS

## 2016-02-15 MED ORDER — SODIUM CHLORIDE 0.9 % IV SOLN
500.0000 mL | Freq: Once | INTRAVENOUS | Status: AC | PRN
  Administered 2016-02-15: 500 mL via INTRAVENOUS

## 2016-02-15 MED ORDER — ESMOLOL HCL-SODIUM CHLORIDE 2000 MG/100ML IV SOLN: 25.0000 ug/kg/min | INTRAVENOUS | Status: DC

## 2016-02-15 MED ORDER — FAMOTIDINE IN NACL 20-0.9 MG/50ML-% IV SOLN
20.0000 mg | Freq: Two times a day (BID) | INTRAVENOUS | Status: DC
  Administered 2016-02-15: 20 mg via INTRAVENOUS
  Filled 2016-02-15: qty 50

## 2016-02-15 MED ORDER — CEFAZOLIN SODIUM-DEXTROSE 2-4 GM/100ML-% IV SOLN
2.0000 g | INTRAVENOUS | Status: AC
  Administered 2016-02-15: 2 g via INTRAVENOUS

## 2016-02-15 MED ORDER — EVICEL 2 ML EX KIT
PACK | CUTANEOUS | Status: DC | PRN
  Administered 2016-02-15: 2 mL

## 2016-02-15 MED ORDER — ALUM & MAG HYDROXIDE-SIMETH 200-200-20 MG/5ML PO SUSP: 15.0000 mL | ORAL | Status: DC | PRN

## 2016-02-15 MED ORDER — LIDOCAINE HCL (CARDIAC) 20 MG/ML IV SOLN
INTRAVENOUS | Status: DC | PRN
  Administered 2016-02-15: 100 mg via INTRAVENOUS

## 2016-02-15 MED ORDER — METOPROLOL TARTRATE 5 MG/5ML IV SOLN: 2.0000 mg | INTRAVENOUS | Status: DC | PRN

## 2016-02-15 MED ORDER — GLYCOPYRROLATE 0.2 MG/ML IJ SOLN
INTRAMUSCULAR | Status: DC | PRN
  Administered 2016-02-15: 0.2 mg via INTRAVENOUS

## 2016-02-15 MED ORDER — DEXAMETHASONE SODIUM PHOSPHATE 10 MG/ML IJ SOLN
INTRAMUSCULAR | Status: DC | PRN
  Administered 2016-02-15: 10 mg via INTRAVENOUS

## 2016-02-15 SURGICAL SUPPLY — 56 items
BAG DECANTER FOR FLEXI CONT (MISCELLANEOUS) ×2
BLADE SURG 15 STRL LF DISP TIS (BLADE) ×1
BLADE SURG 15 STRL SS (BLADE) ×1
BLADE SURG SZ11 CARB STEEL (BLADE) ×2
BOOT SUTURE AID YELLOW STND (SUTURE) ×2
BRUSH SCRUB 4% CHG (MISCELLANEOUS) ×2
CANISTER SUCT 1200ML W/VALVE (MISCELLANEOUS) ×2
CATH TRAY 16F METER LATEX (MISCELLANEOUS) ×2
DERMABOND ADVANCED (GAUZE/BANDAGES/DRESSINGS) ×1
DERMABOND ADVANCED .7 DNX12 (GAUZE/BANDAGES/DRESSINGS) ×1
DRAPE INCISE IOBAN 66X45 STRL (DRAPES) ×2
DRAPE LAPAROTOMY 77X122 PED (DRAPES) ×2
DRAPE SHEET LG 3/4 BI-LAMINATE (DRAPES)
DRSG TEGADERM 4X4.75 (GAUZE/BANDAGES/DRESSINGS)
DRSG TELFA 3X8 NADH (GAUZE/BANDAGES/DRESSINGS)
DURAPREP 26ML APPLICATOR (WOUND CARE) ×2
ELECT CAUTERY BLADE 6.4 (BLADE) ×2
ELECT REM PT RETURN 9FT ADLT (ELECTROSURGICAL) ×2
GLOVE BIO SURGEON STRL SZ7 (GLOVE) ×8
GLOVE INDICATOR 7.5 STRL GRN (GLOVE) ×4
GOWN STRL REUS W/ TWL LRG LVL3 (GOWN DISPOSABLE) ×2
GOWN STRL REUS W/ TWL XL LVL3 (GOWN DISPOSABLE) ×1
GOWN STRL REUS W/TWL LRG LVL3 (GOWN DISPOSABLE) ×2
GOWN STRL REUS W/TWL XL LVL3 (GOWN DISPOSABLE) ×1
HEMOSTAT SURGICEL 2X3 (HEMOSTASIS) ×2
IV NS 250ML (IV SOLUTION) ×1
IV NS 250ML BAXH (IV SOLUTION) ×1
KIT RM TURNOVER STRD PROC AR (KITS) ×2
LABEL OR SOLS (LABEL) ×2
LOOP RED MAXI  1X406MM (MISCELLANEOUS) ×2
LOOP VESSEL MAXI 1X406 RED (MISCELLANEOUS) ×2
LOOP VESSEL MINI 0.8X406 BLUE (MISCELLANEOUS) ×1
LOOPS BLUE MINI 0.8X406MM (MISCELLANEOUS) ×1
NEEDLE FILTER BLUNT 18X 1/2SAF (NEEDLE) ×1
NEEDLE FILTER BLUNT 18X1 1/2 (NEEDLE) ×1
NEEDLE HYPO 25X1 1.5 SAFETY (NEEDLE) ×2
NS IRRIG 1000ML POUR BTL (IV SOLUTION) ×2
PACK BASIN MAJOR ARMC (MISCELLANEOUS) ×2
PENCIL ELECTRO HAND CTR (MISCELLANEOUS)
SHUNT W TPORT 9FR PRUITT F3 (SHUNT) ×2
SUT MNCRL 4-0 (SUTURE) ×1
SUT MNCRL 4-0 27XMFL (SUTURE) ×1
SUT PROLENE 6 0 BV (SUTURE) ×8
SUT PROLENE 7 0 BV 1 (SUTURE) ×6
SUT SILK 2 0 (SUTURE) ×1
SUT SILK 2-0 18XBRD TIE 12 (SUTURE) ×1
SUT SILK 3 0 (SUTURE) ×1
SUT SILK 3-0 18XBRD TIE 12 (SUTURE) ×1
SUT SILK 4 0 (SUTURE) ×1
SUT SILK 4-0 18XBRD TIE 12 (SUTURE) ×1
SUT VIC AB 3-0 SH 27 (SUTURE) ×2
SUT VIC AB 3-0 SH 27X BRD (SUTURE) ×2
SYR 20CC LL (SYRINGE) ×2
SYRINGE 10CC LL (SYRINGE) ×4
TOWEL OR 17X26 4PK STRL BLUE (TOWEL DISPOSABLE)
TUBING CONNECTING 10 (TUBING)

## 2016-02-15 NOTE — Op Note (Signed)
[facility]   OPERATIVE NOTE  PROCEDURE:   1.  Left carotid endarterectomy   PRE-OPERATIVE DIAGNOSIS: 1.  80% left carotid stenosis   POST-OPERATIVE DIAGNOSIS: same as above   SURGEON: Leotis Pain, MD  ASSISTANT(S): Hezzie Bump, PA-C  ANESTHESIA: general  ESTIMATED BLOOD LOSS: 25 cc  FINDING(S): 1.  Left carotid plaque.  SPECIMEN(S):  Carotid plaque (sent to Pathology)  INDICATIONS:   JAYZON BLAKENSHIP is a 80 y.o. male who presents with left carotid stenosis of 80%.  I discussed with the patient the risks, benefits, and alternatives to carotid endarterectomy.  I discussed the differences between carotid stenting and carotid endarterectomy. I discussed the procedural details of carotid endarterectomy with the patient.  The patient is aware that the risks of carotid endarterectomy include but are not limited to: bleeding, infection, stroke, myocardial infarction, death, cranial nerve injuries both temporary and permanent, neck hematoma, possible airway compromise, labile blood pressure post-operatively, cerebral hyperperfusion syndrome, and possible need for additional interventions in the future. The patient is aware of the risks and agrees to proceed forward with the procedure.  DESCRIPTION: After full informed written consent was obtained from the patient, the patient was brought back to the operating room and placed supine upon the operating table.  Prior to induction, the patient received IV antibiotics.  After obtaining adequate anesthesia, the patient was placed into a modified beach chair position with a shoulder roll in place and the patient's neck slightly hyperextended and rotated away from the surgical site.  The patient was prepped in the standard fashion for a carotid endarterectomy.  I made an incision anterior to the sternocleidomastoid muscle and dissected down through the subcutaneous tissue.  The platysmas was opened with electrocautery.  Then I  dissected down to the internal jugular vein and facial vein.  The facial vein is ligated and divided between 2-0 silk ties.  This was dissected posteriorly until I obtained visualization of the common carotid artery.  This was dissected out and then a vessel loop was placed around the common carotid artery.  I then dissected in a periadventitial fashion along the common carotid artery up to the bifurcation.  I then identified the external carotid artery and the superior thyroid artery.  I placed a vessel loop around the superior thyroid artery, and I also dissected out the external carotid artery and placed a vessel loop around it. In the process of this dissection, the hypoglossal nerve was identified and protected from harm.  I then dissected out the internal carotid artery until I identified an area in the internal carotid artery clearly above the stenosis.  I dissected slightly distal to this area, and placed a vessel loop around the artery.  At this point, we gave the patient 6000 units of intravenous heparin.  After this was allowed to circulate for several minutes, I pulled up control on the vessel loops to clamp the internal carotid artery, external carotid artery, superior thyroid artery, and then the common carotid artery.  I then made an arteriotomy in the common carotid artery with a 11 blade, and extended the arteriotomy with a Potts scissor down into the common carotid artery, then I carried the arteriotomy through the bifurcation into the internal carotid artery until I reached an area that was not diseased.  At this point, I took the [place] shunt that previously been prepared and I inserted it into the internal carotid artery first, and then into the common carotid artery taking  care to flush and de-air prior to release of control. At this point, I started the endarterectomy in the common carotid artery with a [place] elevator and carried this dissection down into the common carotid artery  circumferentially.  Then I transected the plaque at a segment where it was adherent and transected the plaque with Potts scissors.  I then carried this dissection up into the external carotid artery.  The plaque was extracted by unclamping the external carotid artery and performing an eversion endarterectomy.  The dissection was then carried into the internal carotid artery where a nice feathered end point was created with gentle traction.  I passed the plaque off the field as a specimen. At this point I removed all loose flecks and remaining disease possible.  At this point, I was satisfied that the minimal remaining disease was densely adherent to the wall and wall integrity was intact. The distal endpoint was tacked down with three 7-0 Prolene sutures.  Due to the generous size of the artery, I elected to perform a primary closure.  I started 6-0 Prolene suture at the distal endpoint and ran one half the length of the arteriotomy.  I started the second 6-0 Prolene at the proximal end point.  The proximal suture line was run approximately one quarter the length of the arteriotomy.  Prior to completing this suture line, I removed the shunt first from the internal carotid artery, from which there was excellent backbleeding, and clamped it.  Then I removed the shunt from the common carotid artery, from which there was excellent antegrade bleeding, and then clamped it.  At this point, I allowed the external carotid artery to backbleed, which was excellent.  Then I instilled heparinized saline in this patched artery and then completed the patch angioplasty in the usual fashion.  First, I released the clamp on the external carotid artery, then I released it on the common carotid artery.  After waiting a few seconds, I then released it on the internal carotid artery. Several minutes of pressure were held and 6-0 Prolene patch sutures were used as need for hemostasis.  At this point, I placed Surgicel and Evicel topical  hemostatic agents.  There was no more active bleeding in the surgical site.  The sternocleidomastoid space was closed with three interrupted 3-0 Vicryl sutures. I then reapproximated the platysma muscle with a running stitch of 3-0 Vicryl.  The skin was then closed with a running subcuticular 4-0 Monocryl.  The skin was then cleaned, dried and Dermabond was used to reinforce the skin closure.  The patient awakened and was taken to the recovery room in stable condition, following commands and moving all four extremities without any apparent deficits.    COMPLICATIONS: none  CONDITION: stable  Leotis Pain  02/15/2016, 2:21 PM   This note was created with Dragon Medical transcription system. Any errors in dictation are purely unintentional.

## 2016-02-15 NOTE — H&P (Signed)
[facility] Admission History & Physical  MRN : [id number]  Brett Riley is a 80 y.o. (11/20/1935) male who presents with chief complaint of No chief complaint on file. Marland Kitchen  History of Present Illness: Patient presents for left carotid endarterectomy. Has had an outpatient workup including CT angiogram demonstrating high-grade left carotid artery stenosis. No focal symptoms. No complaints today.  Current Facility-Administered Medications  Medication Dose Route Frequency Provider Last Rate Last Dose  . ceFAZolin (ANCEF) 2-4 GM/100ML-% IVPB           . ceFAZolin (ANCEF) IVPB 2g/100 mL premix  2 g Intravenous On Call to Kaplan, PA-C      . Chlorhexidine Gluconate Cloth 2 % PADS 6 each  6 each Topical Once [employer], PA-C       And  . Chlorhexidine Gluconate Cloth 2 % PADS 6 each  6 each Topical Once [employer], PA-C      . famotidine (PEPCID) 20 MG tablet           . lactated ringers infusion   Intravenous Continuous Algernon Huxley, MD 50 mL/hr at 02/15/16 1138    . lactated ringers infusion   Intravenous Continuous Gunnar Bulla, MD 75 mL/hr at 02/15/16 1151      Past Medical History:  Diagnosis Date  . Coronary artery disease   . Hypercholesterolemia   . Hypertension     Past Surgical History:  Procedure Laterality Date  . CARDIAC CATHETERIZATION N/A 01/19/2016   Procedure: Left Heart Cath and Coronary Angiography;  Surgeon: Wellington Hampshire, MD;  Location: [facility];  Service: Cardiovascular;  Laterality: N/A;  . NO PAST SURGERIES      Social History Social History  Substance Use Topics  . Smoking status: Never Smoker  . Smokeless tobacco: Never Used  . Alcohol use No    Family History Family History  Problem Relation Age of Onset  . Heart disease Father     MI  . Heart attack Father   . Stroke Mother   . Breast cancer Sister   . Colon cancer Neg Hx   . Prostate cancer Neg Hx     No Known  Allergies   REVIEW OF SYSTEMS (Negative unless checked)  Constitutional: [] Weight loss  [] Fever  [] Chills Cardiac: [] Chest pain   [] Chest pressure   [] Palpitations   [] Shortness of breath when laying flat   [] Shortness of breath at rest   [] Shortness of breath with exertion. Vascular:  [] Pain in legs with walking   [] Pain in legs at rest   [] Pain in legs when laying flat   [] Claudication   [] Pain in feet when walking  [] Pain in feet at rest  [] Pain in feet when laying flat   [] History of DVT   [] Phlebitis   [] Swelling in legs   [] Varicose veins   [] Non-healing ulcers Pulmonary:   [] Uses home oxygen   [] Productive cough   [] Hemoptysis   [] Wheeze  [] COPD   [] Asthma Neurologic:  [] Dizziness  [] Blackouts   [] Seizures   [] History of stroke   [] History of TIA  [] Aphasia   [] Temporary blindness   [] Dysphagia   [] Weakness or numbness in arms   [] Weakness or numbness in legs Musculoskeletal:  [] Arthritis   [] Joint swelling   [] Joint pain   [] Low back pain Hematologic:  [] Easy bruising  [] Easy bleeding   [] Hypercoagulable state   [] Anemic  [] Hepatitis Gastrointestinal:  [] Blood in stool   []   Vomiting blood  [] Gastroesophageal reflux/heartburn   [] Difficulty swallowing. Genitourinary:  [] Chronic kidney disease   [] Difficult urination  [] Frequent urination  [] Burning with urination   [] Blood in urine Skin:  [] Rashes   [] Ulcers   [] Wounds Psychological:  [] History of anxiety   []  History of major depression.  Physical Examination  Vitals:   02/15/16 1059  BP: 132/63  Pulse: 67  Resp: 18  Temp: 97.7 F (36.5 C)  TempSrc: Oral  SpO2: 100%  Weight: 92.5 kg (204 lb)  Height: 6' (1.829 m)   Body mass index is 27.67 kg/m. Gen: WD/WN, NAD. Appears younger than stated age Head: [place]/AT, No temporalis wasting. Prominent temp pulse not noted. Ear/Nose/Throat: Hearing grossly intact, nares w/o erythema or drainage, oropharynx w/o Erythema/Exudate,  Eyes: Conjunctiva clear, sclera non-icteric Neck: Trachea  midline.  No JVD.  Pulmonary:  Good air movement, equal bilaterally, no use of accessory muscles.  Cardiac: RRR, normal S1, S2. Vascular:  Vessel Right Left  Radial Palpable Palpable                                   Gastrointestinal: soft, non-tender/non-distended. No guarding/reflex.  Musculoskeletal: M/S 5/5 throughout.  Extremities without ischemic changes.  No deformity or atrophy.  Neurologic: Sensation grossly intact in extremities.  Symmetrical.  Speech is fluent. Motor exam as listed above. Psychiatric: Judgment intact, Mood & affect appropriate for pt's clinical situation. Dermatologic: No rashes or ulcers noted.  No cellulitis or open wounds. Lymph : No Cervical, Axillary, or Inguinal lymphadenopathy.     CBC Lab Results  Component Value Date   WBC 3.8 02/13/2016   HGB 13.0 02/13/2016   HCT 37.0 (L) 02/13/2016   MCV 93.9 02/13/2016   PLT 136 (L) 02/13/2016    BMET    Component Value Date/Time   NA 138 02/13/2016 0929   K 3.8 02/13/2016 0929   CL 103 02/13/2016 0929   CO2 29 02/13/2016 0929   GLUCOSE 70 02/13/2016 0929   BUN 22 (H) 02/13/2016 0929   CREATININE 0.83 02/13/2016 0929   CALCIUM 9.4 02/13/2016 0929   GFRNONAA >60 02/13/2016 0929   GFRAA >60 02/13/2016 0929   Estimated Creatinine Clearance: 80.5 mL/min (by C-G formula based on SCr of 0.83 mg/dL).  COAG Lab Results  Component Value Date   INR 1.08 02/13/2016   INR 0.97 01/18/2016    Radiology Nm Myocar Multi W/spect W/wall Motion / Ef  Result Date: 01/18/2016  Note of 1-2 mm slow upsloping ST depression during peak exercise.  Nuclear imaging revealed evidence of ischemia as described below  Test demonstrated moderately impaired functional capacity. Patient achieved 4.6 METs and reached 101% of maximum predicted heart rate. There was note of exaggerated heart rate response to exercise. No chest pain. Note of 1-2 mm slow upsloping ST depression during peak exercise. No significant  arrhythmias. Nuclear imaging revealed the following with attenuation correction: 1. Small sized defect involving the apical to basal inferior walls. The degree of photon reduction was mild. This was reversible at rest. This is suggestive of ischemia. 2. Small to medium sized defect involving the apical lateral, mid anterolateral and mid inferolateral walls. The degree photon reduction was mild. This appears reversible at rest. This is suggestive of ischemia Gated SPECT imaging reveals normal wall motion and thickening with a calculated left ventricular ejection fraction of 57%     Assessment/Plan 1. High grade left carotid artery stenosis.  For left carotid endarterectomy today. Risks and benefits discussed. 2. Very mild right carotid artery stenosis. We'll continue medical management after his left carotid endarterectomy. 3. Hypertension. Stable on outpatient medications.   Leotis Pain, MD  02/15/2016 12:02 PM

## 2016-02-15 NOTE — Anesthesia Procedure Notes (Signed)
Procedure Name: Intubation Date/Time: 02/15/2016 12:52 PM Performed by: Darlyne Russian Pre-anesthesia Checklist: Patient identified, Emergency Drugs available, Suction available, Patient being monitored and Timeout performed Patient Re-evaluated:Patient Re-evaluated prior to inductionOxygen Delivery Method: Circle system utilized Preoxygenation: Pre-oxygenation with 100% oxygen Intubation Type: IV induction Ventilation: Mask ventilation without difficulty and Oral airway inserted - appropriate to patient size Laryngoscope Size: Mac and 4 Grade View: Grade III Tube type: Oral Tube size: 7.5 mm Number of attempts: 1 Airway Equipment and Method: Stylet Placement Confirmation: ETT inserted through vocal cords under direct vision,  positive ETCO2 and breath sounds checked- equal and bilateral Secured at: 23 cm Tube secured with: Tape Dental Injury: Teeth and Oropharynx as per pre-operative assessment

## 2016-02-15 NOTE — Anesthesia Preprocedure Evaluation (Signed)
Anesthesia Evaluation  Patient identified by MRN, date of birth, ID band Patient awake    Reviewed: Allergy & Precautions, H&P , NPO status , Patient's Chart, lab work & pertinent test results, reviewed documented beta blocker date and time   History of Anesthesia Complications Negative for: history of anesthetic complications  Airway Mallampati: II  TM Distance: >3 FB Neck ROM: full    Dental no notable dental hx. (+) Partial Upper, Poor Dentition   Pulmonary neg pulmonary ROS,    Pulmonary exam normal        Cardiovascular Exercise Tolerance: Good hypertension, On Medications (-) angina+ CAD and + Peripheral Vascular Disease  (-) Past MI, (-) Cardiac Stents and (-) CABG Normal cardiovascular exam(-) dysrhythmias (-) Valvular Problems/Murmurs Rhythm:regular Rate:Normal     Neuro/Psych negative neurological ROS  negative psych ROS   GI/Hepatic negative GI ROS, Neg liver ROS,   Endo/Other  negative endocrine ROS  Renal/GU negative Renal ROS  negative genitourinary   Musculoskeletal   Abdominal   Peds  Hematology  (+) Blood dyscrasia, anemia ,   Anesthesia Other Findings Past Medical History: No date: Coronary artery disease No date: Hypercholesterolemia No date: Hypertension   Reproductive/Obstetrics negative OB ROS                             Anesthesia Physical Anesthesia Plan  ASA: II  Anesthesia Plan: General   Post-op Pain Management:    Induction:   Airway Management Planned:   Additional Equipment:   Intra-op Plan:   Post-operative Plan:   Informed Consent: I have reviewed the patients History and Physical, chart, labs and discussed the procedure including the risks, benefits and alternatives for the proposed anesthesia with the patient or authorized representative who has indicated his/her understanding and acceptance.   Dental Advisory Given  Plan Discussed  with: Anesthesiologist, CRNA and Surgeon  Anesthesia Plan Comments:         Anesthesia Quick Evaluation

## 2016-02-15 NOTE — Transfer of Care (Signed)
Immediate Anesthesia Transfer of Care Note  Patient: Brett Riley  Procedure(s) Performed: Procedure(s): ENDARTERECTOMY CAROTID (Left)  Patient Location: PACU  Anesthesia Type:General  Level of Consciousness: awake, alert , oriented and patient cooperative  Airway & Oxygen Therapy: Patient Spontanous Breathing and Patient connected to nasal cannula oxygen  Post-op Assessment: Report given to RN and Post -op Vital signs reviewed and stable  Post vital signs: Reviewed and stable  Last Vitals:  Vitals:   02/15/16 1059 02/15/16 1453  BP: 132/63 126/72  Pulse: 67 65  Resp: 18 18  Temp: 36.5 C 36.5 C    Last Pain:  Vitals:   02/15/16 1059  TempSrc: Oral         Complications: No apparent anesthesia complications

## 2016-02-15 NOTE — Progress Notes (Signed)
Patient's HR sustained in the 40's, dropped to 36 for 10-12 seconds. Patient asymptomatic, BP within normal range. Dr. Lucky Cowboy was paged; was in [place]. OR RN I spoke with relayed information to him. He stated to continue to monitor the patient, and call him back if there were any deteriorating changes. Advised patient and family of this for their reassurance.

## 2016-02-16 ENCOUNTER — Encounter: Payer: Self-pay | Admitting: Vascular Surgery

## 2016-02-16 LAB — BASIC METABOLIC PANEL WITH GFR
Anion gap: 6 (ref 5–15)
BUN: 20 mg/dL (ref 6–20)
CO2: 24 mmol/L (ref 22–32)
Calcium: 8.6 mg/dL — ABNORMAL LOW (ref 8.9–10.3)
Chloride: 105 mmol/L (ref 101–111)
Creatinine, Ser: 0.88 mg/dL (ref 0.61–1.24)
GFR calc Af Amer: >60 mL/min
GFR calc non Af Amer: >60 mL/min
Glucose, Bld: 142 mg/dL — ABNORMAL HIGH (ref 65–99)
Potassium: 4.4 mmol/L (ref 3.5–5.1)
Sodium: 135 mmol/L (ref 135–145)

## 2016-02-16 LAB — CBC
HCT: 32.4 % — ABNORMAL LOW (ref 40.0–52.0)
Hemoglobin: 11.3 g/dL — ABNORMAL LOW (ref 13.0–18.0)
MCH: 33.2 pg (ref 26.0–34.0)
MCHC: 35 g/dL (ref 32.0–36.0)
MCV: 94.8 fL (ref 80.0–100.0)
Platelets: 135 10*3/uL — ABNORMAL LOW (ref 150–440)
RBC: 3.42 MIL/uL — ABNORMAL LOW (ref 4.40–5.90)
RDW: 13.4 % (ref 11.5–14.5)
WBC: 6.2 10*3/uL (ref 3.8–10.6)

## 2016-02-16 LAB — GLUCOSE, CAPILLARY: Glucose-Capillary: 77 mg/dL (ref 65–99)

## 2016-02-16 MED ORDER — CLOPIDOGREL BISULFATE 75 MG PO TABS: 75.0000 mg | ORAL_TABLET | Freq: Every day | ORAL | 5 refills | Status: DC

## 2016-02-16 MED ORDER — OXYCODONE-ACETAMINOPHEN 5-325 MG PO TABS: 1.0000 | ORAL_TABLET | ORAL | 0 refills | Status: DC | PRN

## 2016-02-16 MED ORDER — FAMOTIDINE 20 MG PO TABS
20.0000 mg | ORAL_TABLET | Freq: Two times a day (BID) | ORAL | Status: DC
  Administered 2016-02-16: 20 mg via ORAL
  Filled 2016-02-16: qty 1

## 2016-02-16 NOTE — Discharge Summary (Signed)
[facility]    Discharge Summary   Patient ID:  Brett Riley MRN: [id number] DOB/AGE: October 08, 1935 80 y.o.  Admit date: 02/15/2016 Discharge date: 02/16/2016 Date of Surgery: 02/15/2016 Surgeon: Surgeon(s): Algernon Huxley, MD  Admission Diagnosis: CAROTID ARTERY STENOSIS  Discharge Diagnoses:  CAROTID ARTERY STENOSIS  Secondary Diagnoses: Past Medical History:  Diagnosis Date  . Coronary artery disease   . Hypercholesterolemia   . Hypertension    Procedure(s): ENDARTERECTOMY CAROTID  Discharged Condition: good  HPI:  Patient presents for left carotid endarterectomy. Has had an outpatient workup including CT angiogram demonstrating high-grade left carotid artery stenosis. On 02/14/16, the patient underwent a left carotid endarterectomy. He tolerated the procedure well and was transferred from the PACU to the ICU without issue. His night of surgery was unremarkable. During his brief inpatient stay, the patients diet was advanced, his foley was removed, his pain was controlled with PO medication and he was ambulating independently without complication.   Hospital Course:  [name] is a 80 y.o. male is S/P Left  Procedure(s): ENDARTERECTOMY CAROTID (LEFT)  Extubated: POD # 0  Physical exam:  A&Ox3, NAD Face: No facial droop noted. Tongue midline.  Neck: Trachea midline. Minimal swelling. Incision: clean, dry and intact. Minimal ecchymosis. CV: RRR Pulm: CTA Bilaterally Abdomen: Soft, NT, ND, (+) BS GU: Foley removed Extremity: Warm, NT, Minimal Edema  Post-op wounds clean, dry, intact or healing well  Pt. Ambulating, voiding and taking PO diet without difficulty.  Pt pain controlled with PO pain meds.  Labs as below  Complications:none  Consults: None  Significant Diagnostic Studies: CBC Lab Results  Component Value Date   WBC 6.2 02/16/2016   HGB 11.3 (L) 02/16/2016   HCT 32.4 (L) 02/16/2016   MCV 94.8 02/16/2016   PLT 135 (L) 02/16/2016   BMET    Component Value Date/Time   NA 135 02/16/2016 0439   K 4.4 02/16/2016 0439   CL 105 02/16/2016 0439   CO2 24 02/16/2016 0439   GLUCOSE 142 (H) 02/16/2016 0439   BUN 20 02/16/2016 0439   CREATININE 0.88 02/16/2016 0439   CALCIUM 8.6 (L) 02/16/2016 0439   GFRNONAA >60 02/16/2016 0439   GFRAA >60 02/16/2016 0439   COAG Lab Results  Component Value Date   INR 1.08 02/13/2016   INR 0.97 01/18/2016   Disposition:  Discharge to :Home  Allergies as of 02/16/2016   No Known Allergies     Medication List    TAKE these medications   ALEVE 220 MG Caps Generic drug:  Naproxen Sodium Take by mouth as needed.   aspirin 81 MG tablet Take 81 mg by mouth at bedtime.   clopidogrel 75 MG tablet Commonly known as:  PLAVIX Take 1 tablet (75 mg total) by mouth daily with breakfast. Start taking on:  02/17/2016   isosorbide mononitrate 30 MG 24 hr tablet Commonly known as:  IMDUR Take 1 tablet (30 mg total) by mouth daily.   metoprolol succinate 100 MG 24 hr tablet Commonly known as:  TOPROL-XL TAKE 1 TABLET EVERY DAY WITH A MEAL What changed:  how much to take  how to take this  when to take this  additional instructions   multivitamins with iron Tabs tablet Take 1 tablet by mouth daily.   nitroGLYCERIN 0.4 MG SL tablet Commonly known as:  NITROSTAT Place 1 tablet (0.4 mg total) under the tongue every 5 (five) minutes as needed.   oxyCODONE-acetaminophen 5-325 MG tablet  Commonly known as:  PERCOCET/ROXICET Take 1-2 tablets by mouth every 4 (four) hours as needed for moderate pain.   rosuvastatin 10 MG tablet Commonly known as:  CRESTOR Take 1 tablet (10 mg total) by mouth daily. What changed:  when to take this      Verbal and written Discharge instructions given to the patient. Wound care per Discharge AVS Follow-up Information    Leotis Pain, MD Follow up in 2 week(s).   Specialties:  Vascular Surgery, Radiology,  Interventional Cardiology Why:  CEA on 02/16/16. Two week incision check. Contact information: [place] [address] [id number]          Signed: Sela Hua, PA-C  02/16/2016, 12:25 PM

## 2016-02-16 NOTE — Progress Notes (Signed)
CONCERNING: IV to Oral Route Change Policy  RECOMMENDATION: This patient is receiving famotidine by the intravenous route.  Based on criteria approved by the Pharmacy and Therapeutics Committee, the intravenous medication(s) is/are being converted to the equivalent oral dose form(s).   DESCRIPTION: These criteria include:  The patient is eating (either orally or via tube) and/or has been taking other orally administered medications for a least 24 hours  The patient has no evidence of active gastrointestinal bleeding or impaired GI absorption (gastrectomy, short bowel, patient on TNA or NPO).  If you have questions about this conversion, please contact the Pharmacy Department  []   [phone] )  Brett Riley [x]   [phone] )  Brett Riley []   [phone] )  [facility] []   [phone] )  [facility] []   [phone] )  [place], [facility] Riley 02/16/2016 9:35 AM

## 2016-02-16 NOTE — Progress Notes (Signed)
Left radial arterial line removed per order in computer. Blood pressure is stable by right upper arm cuff pressures and MAP correlating with arterial line MAP. Patient is resting comfortably in bed, alert and orientated x 4.  Left radial arterial line was removed, pressure was applied to site for approximately 10 minutes. Applied tape and gauze to site. No complications were observed.

## 2016-02-16 NOTE — Care Management (Signed)
Prior to this elective carotid endarterectomy, patient able to perform his adsl.  During the night he did have a drop in pulse into the mid 30's for a very brief period.  No changes in treatment.  Pain is controlled.

## 2016-02-16 NOTE — Discharge Instructions (Signed)
You may shower as of Sunday.  No driving while on pain medication.

## 2016-02-16 NOTE — Anesthesia Postprocedure Evaluation (Signed)
Anesthesia Post Note  Patient: Brett Riley  Procedure(s) Performed: Procedure(s) (LRB): ENDARTERECTOMY CAROTID (Left)  Patient location during evaluation: ICU Anesthesia Type: General Level of consciousness: awake and alert Pain management: pain level controlled Vital Signs Assessment: post-procedure vital signs reviewed and stable Respiratory status: spontaneous breathing and nonlabored ventilation Cardiovascular status: blood pressure returned to baseline Postop Assessment: no headache, no backache and no signs of nausea or vomiting Anesthetic complications: no    Last Vitals:  Vitals:   02/16/16 0500 02/16/16 0600  BP: (!) 106/56 (!) 104/57  Pulse: (!) 45 (!) 49  Resp: 14 16  Temp:      Last Pain:  Vitals:   02/16/16 0502  TempSrc:   PainSc: 0-No pain                 Rolla Plate P

## 2016-02-19 LAB — SURGICAL PATHOLOGY

## 2016-03-01 ENCOUNTER — Encounter (INDEPENDENT_AMBULATORY_CARE_PROVIDER_SITE_OTHER): Payer: Self-pay | Admitting: Vascular Surgery

## 2016-03-01 ENCOUNTER — Ambulatory Visit (INDEPENDENT_AMBULATORY_CARE_PROVIDER_SITE_OTHER): Payer: Commercial Managed Care - HMO | Admitting: Vascular Surgery

## 2016-03-01 VITALS — BP 179/90 | HR 63 | Resp 17 | Ht 72.0 in | Wt 193.6 lb

## 2016-03-01 DIAGNOSIS — I10 Essential (primary) hypertension: Secondary | ICD-10-CM

## 2016-03-01 DIAGNOSIS — I6523 Occlusion and stenosis of bilateral carotid arteries: Secondary | ICD-10-CM

## 2016-03-01 NOTE — Assessment & Plan Note (Signed)
blood pressure control important in reducing the progression of atherosclerotic disease. On appropriate oral medications.  

## 2016-03-01 NOTE — Assessment & Plan Note (Signed)
Doing well about 2 weeks status post left carotid endarterectomy for high-grade stenosis. No complications. Resume all normal activities as tolerated. Return to clinic in 3 months with duplex for follow-up.

## 2016-03-01 NOTE — Progress Notes (Signed)
    Patient ID: Brett Riley, male   DOB: [birth year], 80 y.o.   MRN: [id number]  Chief Complaint  Patient presents with  . Follow-up    HPI Brett Riley is a 80 y.o. male.  Patient returns 2 weeks after left carotid endarterectomy. He is doing well. He has not quite reached full energy and activity level, but has been steadily improving and is feeling pretty well today. He had no periprocedural complications.   Past Medical History:  Diagnosis Date  . Coronary artery disease   . Hypercholesterolemia   . Hypertension     Past Surgical History:  Procedure Laterality Date  . CARDIAC CATHETERIZATION N/A 01/19/2016   Procedure: Left Heart Cath and Coronary Angiography;  Surgeon: Wellington Hampshire, MD;  Location: [facility];  Service: Cardiovascular;  Laterality: N/A;  . ENDARTERECTOMY Left 02/15/2016   Procedure: ENDARTERECTOMY CAROTID;  Surgeon: Algernon Huxley, MD;  Location: [facility];  Service: Vascular;  Laterality: Left;  . NO PAST SURGERIES        No Known Allergies  Current Outpatient Prescriptions  Medication Sig Dispense Refill  . aspirin 81 MG tablet Take 81 mg by mouth at bedtime.     . clopidogrel (PLAVIX) 75 MG tablet Take 1 tablet (75 mg total) by mouth daily with breakfast. 30 tablet 5  . isosorbide mononitrate (IMDUR) 30 MG 24 hr tablet Take 1 tablet (30 mg total) by mouth daily. 30 tablet 6  . metoprolol succinate (TOPROL-XL) 100 MG 24 hr tablet TAKE 1 TABLET EVERY DAY WITH A MEAL (Patient taking differently: Take 100 mg by mouth daily with breakfast. ) 90 tablet 3  . Multiple Vitamins-Iron (MULTIVITAMINS WITH IRON) TABS Take 1 tablet by mouth daily.    . Naproxen Sodium (ALEVE) 220 MG CAPS Take by mouth as needed.    . nitroGLYCERIN (NITROSTAT) 0.4 MG SL tablet Place 1 tablet (0.4 mg total) under the tongue every 5 (five) minutes as needed. 25 tablet 6  . rosuvastatin (CRESTOR) 10 MG tablet Take 1 tablet (10 mg total) by mouth daily. (Patient taking  differently: Take 10 mg by mouth at bedtime. ) 30 tablet 6  . oxyCODONE-acetaminophen (PERCOCET/ROXICET) 5-325 MG tablet Take 1-2 tablets by mouth every 4 (four) hours as needed for moderate pain. (Patient not taking: Reported on 03/01/2016) 30 tablet 0   No current facility-administered medications for this visit.         Physical Exam BP (!) 179/90   Pulse 63   Resp 17   Ht 6' (1.829 m)   Wt 193 lb 9.6 oz (87.8 kg)   BMI 26.26 kg/m  Gen:  WD/WN, NAD Skin: incision C/D/I     Assessment/Plan:  Essential hypertension, benign blood pressure control important in reducing the progression of atherosclerotic disease. On appropriate oral medications.   Carotid stenosis Doing well about 2 weeks status post left carotid endarterectomy for high-grade stenosis. No complications. Resume all normal activities as tolerated. Return to clinic in 3 months with duplex for follow-up.      Leotis Pain 03/01/2016, 1:22 PM   This note was created with Dragon medical transcription system.  Any errors from dictation are unintentional.

## 2016-03-11 ENCOUNTER — Ambulatory Visit (INDEPENDENT_AMBULATORY_CARE_PROVIDER_SITE_OTHER): Payer: Medicare PPO | Admitting: Internal Medicine

## 2016-03-11 ENCOUNTER — Encounter: Payer: Self-pay | Admitting: Internal Medicine

## 2016-03-11 VITALS — BP 130/84 | HR 69 | Temp 98.1°F | Ht 72.0 in | Wt 192.6 lb

## 2016-03-11 DIAGNOSIS — D696 Thrombocytopenia, unspecified: Secondary | ICD-10-CM | POA: Diagnosis not present

## 2016-03-11 DIAGNOSIS — I2510 Atherosclerotic heart disease of native coronary artery without angina pectoris: Secondary | ICD-10-CM

## 2016-03-11 DIAGNOSIS — D649 Anemia, unspecified: Secondary | ICD-10-CM

## 2016-03-11 DIAGNOSIS — E7800 Pure hypercholesterolemia, unspecified: Secondary | ICD-10-CM | POA: Diagnosis not present

## 2016-03-11 DIAGNOSIS — I6522 Occlusion and stenosis of left carotid artery: Secondary | ICD-10-CM | POA: Diagnosis not present

## 2016-03-11 DIAGNOSIS — I10 Essential (primary) hypertension: Secondary | ICD-10-CM

## 2016-03-11 LAB — LIPID PANEL
Cholesterol: 127 mg/dL (ref 0–200)
HDL: 42.1 mg/dL
LDL Cholesterol: 67 mg/dL (ref 0–99)
NonHDL: 84.94
Total CHOL/HDL Ratio: 3
Triglycerides: 89 mg/dL (ref 0.0–149.0)
VLDL: 17.8 mg/dL (ref 0.0–40.0)

## 2016-03-11 LAB — HEPATIC FUNCTION PANEL
ALT: 18 U/L (ref 0–53)
AST: 19 U/L (ref 0–37)
Albumin: 4.6 g/dL (ref 3.5–5.2)
Alkaline Phosphatase: 85 U/L (ref 39–117)
Bilirubin, Direct: 0.2 mg/dL (ref 0.0–0.3)
Total Bilirubin: 0.8 mg/dL (ref 0.2–1.2)
Total Protein: 7.6 g/dL (ref 6.0–8.3)

## 2016-03-11 LAB — BASIC METABOLIC PANEL WITH GFR
BUN: 19 mg/dL (ref 6–23)
CO2: 29 meq/L (ref 19–32)
Calcium: 9.9 mg/dL (ref 8.4–10.5)
Chloride: 103 meq/L (ref 96–112)
Creatinine, Ser: 0.97 mg/dL (ref 0.40–1.50)
GFR: 79.51 mL/min
Glucose, Bld: 94 mg/dL (ref 70–99)
Potassium: 4.3 meq/L (ref 3.5–5.1)
Sodium: 140 meq/L (ref 135–145)

## 2016-03-11 LAB — CBC WITH DIFFERENTIAL/PLATELET
Basophils Absolute: 0 10*3/uL (ref 0.0–0.1)
Basophils Relative: 0.4 % (ref 0.0–3.0)
Eosinophils Absolute: 0.2 10*3/uL (ref 0.0–0.7)
Eosinophils Relative: 4.4 % (ref 0.0–5.0)
HCT: 41.3 % (ref 39.0–52.0)
Hemoglobin: 14.2 g/dL (ref 13.0–17.0)
Lymphocytes Relative: 30.4 % (ref 12.0–46.0)
Lymphs Abs: 1.6 10*3/uL (ref 0.7–4.0)
MCHC: 34.4 g/dL (ref 30.0–36.0)
MCV: 95.5 fl (ref 78.0–100.0)
Monocytes Absolute: 0.4 10*3/uL (ref 0.1–1.0)
Monocytes Relative: 7.1 % (ref 3.0–12.0)
Neutro Abs: 3.1 10*3/uL (ref 1.4–7.7)
Neutrophils Relative %: 57.7 % (ref 43.0–77.0)
Platelets: 178 10*3/uL (ref 150.0–400.0)
RBC: 4.32 Mil/uL (ref 4.22–5.81)
RDW: 14.1 % (ref 11.5–15.5)
WBC: 5.4 10*3/uL (ref 4.0–10.5)

## 2016-03-11 LAB — TSH: TSH: 2.56 u[IU]/mL (ref 0.35–4.50)

## 2016-03-11 LAB — FERRITIN: Ferritin: 250.1 ng/mL (ref 22.0–322.0)

## 2016-03-11 LAB — VITAMIN B12: Vitamin B-12: 254 pg/mL (ref 211–911)

## 2016-03-11 NOTE — Progress Notes (Signed)
Patient ID: Brett Riley, male   DOB: Dec 26, 1935, 81 y.o.   MRN: [id number]   Subjective:    Patient ID: Brett Riley, male    DOB: [birth year], 81 y.o.   MRN: [id number]  HPI  Patient here for a scheduled follow up.  He is s/p recent left carotid endarterectomy.  Performed by Dr Lucky Cowboy.  He is doing well.  He had heart cath prior to his surgery.  Found to have significant 2 vessel CAD.  Recommended medical management.  On crestor and tolerating.  No chest pain.  No sob.  No acid reflux.  No abdominal pain or cramping.  Bowels stable.  Overall he feels he is doing well.  Has f/u with cardiology planned at the end of 04/2016.    Past Medical History:  Diagnosis Date  . Coronary artery disease   . Hypercholesterolemia   . Hypertension    Past Surgical History:  Procedure Laterality Date  . CARDIAC CATHETERIZATION N/A 01/19/2016   Procedure: Left Heart Cath and Coronary Angiography;  Surgeon: Wellington Hampshire, MD;  Location: [place] [facility];  Service: Cardiovascular;  Laterality: N/A;  . ENDARTERECTOMY Left 02/15/2016   Procedure: ENDARTERECTOMY CAROTID;  Surgeon: Algernon Huxley, MD;  Location: [facility];  Service: Vascular;  Laterality: Left;  . NO PAST SURGERIES     Family History  Problem Relation Age of Onset  . Heart disease Father     MI  . Heart attack Father   . Stroke Mother   . Breast cancer Sister   . Colon cancer Neg Hx   . Prostate cancer Neg Hx    Social History   Social History  . Marital status: Married    Spouse name: N/A  . Number of children: 1  . Years of education: N/A   Occupational History  .  [employer]   Social History Main Topics  . Smoking status: Never Smoker  . Smokeless tobacco: Never Used  . Alcohol use No  . Drug use: No  . Sexual activity: Not Asked   Other Topics Concern  . None   Social History Narrative  . None    Outpatient Encounter Prescriptions as of 03/11/2016  Medication Sig  . aspirin 81 MG tablet Take 81 mg by mouth  at bedtime.   . clopidogrel (PLAVIX) 75 MG tablet Take 1 tablet (75 mg total) by mouth daily with breakfast.  . isosorbide mononitrate (IMDUR) 30 MG 24 hr tablet Take 1 tablet (30 mg total) by mouth daily.  . metoprolol succinate (TOPROL-XL) 100 MG 24 hr tablet TAKE 1 TABLET EVERY DAY WITH A MEAL (Patient taking differently: Take 100 mg by mouth daily with breakfast. )  . Multiple Vitamins-Iron (MULTIVITAMINS WITH IRON) TABS Take 1 tablet by mouth daily.  . Naproxen Sodium (ALEVE) 220 MG CAPS Take by mouth as needed.  . nitroGLYCERIN (NITROSTAT) 0.4 MG SL tablet Place 1 tablet (0.4 mg total) under the tongue every 5 (five) minutes as needed.  . rosuvastatin (CRESTOR) 10 MG tablet Take 1 tablet (10 mg total) by mouth daily. (Patient taking differently: Take 10 mg by mouth at bedtime. )  . [DISCONTINUED] oxyCODONE-acetaminophen (PERCOCET/ROXICET) 5-325 MG tablet Take 1-2 tablets by mouth every 4 (four) hours as needed for moderate pain.   No facility-administered encounter medications on file as of 03/11/2016.     Review of Systems  Constitutional: Negative for appetite change and unexpected weight change.  HENT: Negative for congestion and sinus pressure.  Respiratory: Negative for cough, chest tightness and shortness of breath.   Cardiovascular: Negative for chest pain, palpitations and leg swelling.  Gastrointestinal: Negative for abdominal pain, diarrhea, nausea and vomiting.  Genitourinary: Negative for difficulty urinating and dysuria.  Musculoskeletal: Negative for back pain and joint swelling.  Skin: Negative for color change and rash.  Neurological: Negative for dizziness, light-headedness and headaches.  Psychiatric/Behavioral: Negative for agitation and dysphoric mood.       Objective:    Physical Exam  Constitutional: He appears well-developed and well-nourished. No distress.  HENT:  Nose: Nose normal.  Mouth/Throat: Oropharynx is clear and moist.  Neck: Neck supple. No  thyromegaly present.  Well healed incision site.   Cardiovascular: Normal rate and regular rhythm.   Pulmonary/Chest: Effort normal and breath sounds normal. No respiratory distress.  Abdominal: Soft. Bowel sounds are normal. There is no tenderness.  Musculoskeletal: He exhibits no edema or tenderness.  Lymphadenopathy:    He has no cervical adenopathy.  Skin: No rash noted. No erythema.  Psychiatric: He has a normal mood and affect. His behavior is normal.    BP 130/84   Pulse 69   Temp 98.1 F (36.7 C) (Oral)   Ht 6' (1.829 m)   Wt 192 lb 9.6 oz (87.4 kg)   SpO2 96%   BMI 26.12 kg/m  Wt Readings from Last 3 Encounters:  03/11/16 192 lb 9.6 oz (87.4 kg)  03/01/16 193 lb 9.6 oz (87.8 kg)  02/15/16 204 lb (92.5 kg)     Lab Results  Component Value Date   WBC 6.2 02/16/2016   HGB 11.3 (L) 02/16/2016   HCT 32.4 (L) 02/16/2016   PLT 135 (L) 02/16/2016   GLUCOSE 142 (H) 02/16/2016   CHOL 141 09/07/2015   TRIG 88.0 09/07/2015   HDL 41.40 09/07/2015   LDLCALC 82 09/07/2015   ALT 18 09/07/2015   AST 22 09/07/2015   NA 135 02/16/2016   K 4.4 02/16/2016   CL 105 02/16/2016   CREATININE 0.88 02/16/2016   BUN 20 02/16/2016   CO2 24 02/16/2016   TSH 2.41 09/06/2014   PSA 1.00 09/07/2015   INR 1.08 02/13/2016       Assessment & Plan:   Problem List Items Addressed This Visit    Anemia    Colonoscopy 08/12/2013 - normal.  Recommended f/u in 10 years.  Recent hgb 11.5.  Recheck cbc today.   Will also check B12 and ferritin.       Relevant Orders   Ferritin   Vitamin B12   CAD (coronary artery disease)    Recent cath revealed 2 vessel disease.  Seeing Dr Loel Dubonnet - cardiology.  Continue aggressive risk factor modification.  Currently asymptomatic.       Carotid stenosis, asymptomatic, left    S/p Left CEA.  Seeing Dr Lucky Cowboy.  Doing well.  Continue risk factor modification.        Essential hypertension, benign - Primary    Blood pressure under good control.  Continue  same medication regimen.  Follow pressures.  Follow metabolic panel.        Relevant Orders   TSH   Basic metabolic panel   Hypercholesterolemia    On crestor.  Low cholesterol diet and exercise.  Follow lipid panel and liver function tests.        Relevant Orders   Hepatic function panel   Lipid panel   Thrombocytopenia (HCC)    Last platelet count 135 - stable.  Recheck  cbc.       Relevant Orders   CBC with Differential/Platelet       Einar Pheasant, MD

## 2016-03-11 NOTE — Assessment & Plan Note (Signed)
S/p Left CEA.  Seeing Dr Lucky Cowboy.  Doing well.  Continue risk factor modification.

## 2016-03-11 NOTE — Assessment & Plan Note (Signed)
Last platelet count 135 - stable.  Recheck cbc.

## 2016-03-11 NOTE — Assessment & Plan Note (Addendum)
Colonoscopy 08/12/2013 - normal.  Recommended f/u in 10 years.  Recent hgb 11.5.  Recheck cbc today.   Will also check B12 and ferritin.

## 2016-03-11 NOTE — Assessment & Plan Note (Signed)
Recent cath revealed 2 vessel disease.  Seeing Dr Loel Dubonnet - cardiology.  Continue aggressive risk factor modification.  Currently asymptomatic.

## 2016-03-11 NOTE — Progress Notes (Signed)
Pre visit review using our clinic review tool, if applicable. No additional management support is needed unless otherwise documented below in the visit note. 

## 2016-03-11 NOTE — Assessment & Plan Note (Signed)
Blood pressure under good control.  Continue same medication regimen.  Follow pressures.  Follow metabolic panel.   

## 2016-03-11 NOTE — Assessment & Plan Note (Signed)
On crestor.  Low cholesterol diet and exercise.  Follow lipid panel and liver function tests.   

## 2016-04-24 ENCOUNTER — Encounter: Payer: Self-pay | Admitting: Cardiology

## 2016-04-24 ENCOUNTER — Ambulatory Visit (INDEPENDENT_AMBULATORY_CARE_PROVIDER_SITE_OTHER): Payer: Medicare PPO | Admitting: Cardiology

## 2016-04-24 VITALS — BP 134/82 | HR 62 | Ht 72.0 in | Wt 191.5 lb

## 2016-04-24 DIAGNOSIS — I10 Essential (primary) hypertension: Secondary | ICD-10-CM | POA: Diagnosis not present

## 2016-04-24 DIAGNOSIS — I2510 Atherosclerotic heart disease of native coronary artery without angina pectoris: Secondary | ICD-10-CM

## 2016-04-24 DIAGNOSIS — E785 Hyperlipidemia, unspecified: Secondary | ICD-10-CM | POA: Diagnosis not present

## 2016-04-24 NOTE — Progress Notes (Signed)
Cardiology Office Note   Date:  04/24/2016   ID:  Brett Riley, DOB Dec 14, 1935, MRN [id number]  Referring Doctor:  Einar Pheasant, MD Dr. Lucky Cowboy  Cardiologist:   Wende Bushy, MD   Reason for consultation:  Chief Complaint  Patient presents with  . other    30mo f/u. Pt states he is doing well. Reviewed meds with pt verbally.      History of Present Illness: Brett Riley is a 81 y.o. male who presents for Follow-up for CAD.  Patient tolerated CEA well.  He has committed to making lifestyle changes. He has continued to lose weight. He continues to do yard work and other housework. He does not have any chest pain or shortness of breath.   Patient denies PND, orthopnea, edema. No palpitations. No syncope  ROS:  Please see the history of present illness. Aside from mentioned under HPI, all other systems are reviewed and negative.    Past Medical History:  Diagnosis Date  . Coronary artery disease   . Hypercholesterolemia   . Hypertension     Past Surgical History:  Procedure Laterality Date  . CARDIAC CATHETERIZATION N/A 01/19/2016   Procedure: Left Heart Cath and Coronary Angiography;  Surgeon: Wellington Hampshire, MD;  Location: [place] [facility];  Service: Cardiovascular;  Laterality: N/A;  . ENDARTERECTOMY Left 02/15/2016   Procedure: ENDARTERECTOMY CAROTID;  Surgeon: Algernon Huxley, MD;  Location: [facility];  Service: Vascular;  Laterality: Left;  . NO PAST SURGERIES       reports that he has never smoked. He has never used smokeless tobacco. He reports that he does not drink alcohol or use drugs.   family history includes Breast cancer in his sister; Heart attack in his father; Heart disease in his father; Stroke in his mother.   Outpatient Medications Prior to Visit  Medication Sig Dispense Refill  . aspirin 81 MG tablet Take 81 mg by mouth at bedtime.     . clopidogrel (PLAVIX) 75 MG tablet Take 1 tablet (75 mg total) by mouth daily with breakfast. 30 tablet  5  . metoprolol succinate (TOPROL-XL) 100 MG 24 hr tablet TAKE 1 TABLET EVERY DAY WITH A MEAL (Patient taking differently: Take 100 mg by mouth daily with breakfast. ) 90 tablet 3  . Multiple Vitamins-Iron (MULTIVITAMINS WITH IRON) TABS Take 1 tablet by mouth daily.    . nitroGLYCERIN (NITROSTAT) 0.4 MG SL tablet Place 1 tablet (0.4 mg total) under the tongue every 5 (five) minutes as needed. 25 tablet 6  . isosorbide mononitrate (IMDUR) 30 MG 24 hr tablet Take 1 tablet (30 mg total) by mouth daily. 30 tablet 6  . rosuvastatin (CRESTOR) 10 MG tablet Take 1 tablet (10 mg total) by mouth daily. (Patient taking differently: Take 10 mg by mouth at bedtime. ) 30 tablet 6  . Naproxen Sodium (ALEVE) 220 MG CAPS Take by mouth as needed.     No facility-administered medications prior to visit.      Allergies: Patient has no known allergies.    PHYSICAL EXAM: VS:  BP 134/82 (BP Location: Left Arm, Patient Position: Sitting, Cuff Size: Normal)   Pulse 62   Ht 6' (1.829 m)   Wt 191 lb 8 oz (86.9 kg)   BMI 25.97 kg/m  , Body mass index is 25.97 kg/m. Wt Readings from Last 3 Encounters:  04/24/16 191 lb 8 oz (86.9 kg)  03/11/16 192 lb 9.6 oz (87.4 kg)  03/01/16 193  lb 9.6 oz (87.8 kg)    GENERAL:  well developed, well nourished, not in acute distress HEENT: normocephalic, pink conjunctivae, anicteric sclerae, no xanthelasma, normal dentition, oropharynx clear NECK:  no neck vein engorgement, JVP normal, no hepatojugular reflux, carotid upstroke brisk and symmetric, no bruit, no thyromegaly, no lymphadenopathy LUNGS:  good respiratory effort, clear to auscultation bilaterally CV:  PMI not displaced, no thrills, no lifts, S1 and S2 within normal limits, no palpable S3 or S4, no murmurs, no rubs, no gallops ABD:  Soft, nontender, nondistended, normoactive bowel sounds, no abdominal aortic bruit, no hepatomegaly, no splenomegaly MS: nontender back, no kyphosis, no scoliosis, no joint deformities EXT:   2+ DP/PT pulses, no edema, no varicosities, no cyanosis, no clubbing SKIN: warm, nondiaphoretic, normal turgor, no ulcers NEUROPSYCH: alert, oriented to person, place, and time, sensory/motor grossly intact, normal mood, appropriate affect   Recent Labs: 03/11/2016: ALT 18; BUN 19; Creatinine, Ser 0.97; Hemoglobin 14.2; Platelets 178.0; Potassium 4.3; Sodium 140; TSH 2.56   Lipid Panel    Component Value Date/Time   CHOL 127 03/11/2016 0832   TRIG 89.0 03/11/2016 0832   HDL 42.10 03/11/2016 0832   CHOLHDL 3 03/11/2016 0832   VLDL 17.8 03/11/2016 0832   LDLCALC 67 03/11/2016 0832     Other studies Reviewed:  EKG:  The ekg from 01/11/2016 was personally reviewed by me and it revealed sinus rhythm, 78 BPM.  Additional studies/ records that were reviewed personally reviewed by me today include:  Nuclear stress is 01/18/2016:  Note of 1-2 mm slow upsloping ST depression during peak exercise.  Nuclear imaging revealed evidence of ischemia as described below   Test demonstrated moderately impaired functional capacity. Patient achieved 4.6 METs and reached 101% of maximum predicted heart rate. There was note of exaggerated heart rate response to exercise. No chest pain. Note of 1-2 mm slow upsloping ST depression during peak exercise. No significant arrhythmias.  Nuclear imaging revealed the following with attenuation correction: 1. Small sized defect involving the apical to basal inferior walls. The degree of photon reduction was mild. This was reversible at rest. This is suggestive of ischemia. 2. Small to medium sized defect involving the apical lateral, mid anterolateral and mid inferolateral walls. The degree photon reduction was mild. This appears reversible at rest. This is suggestive of ischemia  Gated SPECT imaging reveals normal wall motion and thickening with a calculated left ventricular ejection fraction of 57%  Left heart cath 01/19/2016:  The left ventricular  systolic function is normal.  LV end diastolic pressure is mildly elevated.  The left ventricular ejection fraction is 55-65% by visual estimate.  Mid RCA lesion, 20 %stenosed.  RPDA lesion, 90 %stenosed.  Ost LM to LM lesion, 40 %stenosed.  Dist LAD lesion, 20 %stenosed.  1st Diag lesion, 95 %stenosed.  Prox LAD to Mid LAD lesion, 70 %stenosed.   1. Significant 2 vessel coronary artery disease. Severe disease involving the mid to distal right PDA and first diagonal. These are not optimal for PCI and risks probably outweigh the benefit. There is moderate left main stenosis and borderline significant disease involving the proximal to mid LAD which is heavily calcified. 2. Normal LV systolic function and mildly elevated left ventricular end-diastolic pressure.  Recommendations: In the absence of clear anginal symptoms, I recommend aggressive medical therapy. If the patient becomes symptomatic, CABG can be considered given that he has diffuse heavily calcified disease. In terms of preoperative cardiovascular risk, he is considered at moderate risk for  carotid endarterectomy. Consider switching to a more potent statin and recommend aggressive treatment of risk factors.    ASSESSMENT AND PLAN:  Hypertension Hyperlipidemia CAD, two-vessel disease, 40% left main disease Carotid disease  Patient has no angina. He is asymptomatic. Continue medical therapy. Continue Imdur, metoprolol, aspirin, Plavix, Crestor. LDL is at goal, less than 70. Continue risk factor modification. Commended on lifestyle changes. In the event of any onset of symptoms like shortness of breath or chest pain, his option for revascularization will be CABG.  Current medicines are reviewed at length with the patient today.  The patient does not have concerns regarding medicines.  Labs/ tests ordered today include:  Orders Placed This Encounter  Procedures  . EKG 12-Lead   Disposition:   FU In 6 months  I  spent at least 25 minutes with the patient today and more than 50% of the time was spent counseling the patient and coordinating care.    Signed, Wende Bushy, MD  04/24/2016 9:41 AM    [place]  This note was generated in part with voice recognition software and I apologize for any typographical errors that were not detected and corrected.

## 2016-04-24 NOTE — Patient Instructions (Signed)
Medication Instructions:  No changes  Labwork: None today  Testing/Procedures: None today   Follow-Up: Your physician wants you to follow-up in: 6 months. You will receive a reminder letter in the mail two months in advance. If you don't receive a letter, please call our office to schedule the follow-up appointment.  It was a pleasure seeing you today here in the office. Please do not hesitate to give [place] a call back if you have any further questions. [place], BSN

## 2016-04-30 ENCOUNTER — Ambulatory Visit (INDEPENDENT_AMBULATORY_CARE_PROVIDER_SITE_OTHER): Payer: Medicare PPO

## 2016-04-30 VITALS — BP 140/70 | HR 63 | Temp 98.0°F | Resp 12 | Ht 71.0 in | Wt 190.1 lb

## 2016-04-30 DIAGNOSIS — Z23 Encounter for immunization: Secondary | ICD-10-CM

## 2016-04-30 DIAGNOSIS — Z0000 Encounter for general adult medical examination without abnormal findings: Secondary | ICD-10-CM | POA: Diagnosis not present

## 2016-04-30 NOTE — Progress Notes (Signed)
Subjective:   Brett Riley is a 81 y.o. male who presents for an Initial Medicare Annual Wellness Visit.  Review of Systems  No ROS.  Medicare Wellness Visit.  Cardiac Risk Factors include: advanced age (>41men, >2 women);hypertension;male gender    Objective:    Today's Vitals   04/30/16 0833  BP: 140/70  Pulse: 63  Resp: 12  Temp: 98 F (36.7 C)  TempSrc: Oral  SpO2: 97%  Weight: 190 lb 1.9 oz (86.2 kg)  Height: 5\' 11"  (1.803 m)   Body mass index is 26.52 kg/m.  Current Medications (verified) Outpatient Encounter Prescriptions as of 04/30/2016  Medication Sig  . aspirin 81 MG tablet Take 81 mg by mouth at bedtime.   . clopidogrel (PLAVIX) 75 MG tablet Take 1 tablet (75 mg total) by mouth daily with breakfast.  . metoprolol succinate (TOPROL-XL) 100 MG 24 hr tablet TAKE 1 TABLET EVERY DAY WITH A MEAL (Patient taking differently: Take 100 mg by mouth daily with breakfast. )  . Multiple Vitamins-Iron (MULTIVITAMINS WITH IRON) TABS Take 1 tablet by mouth daily.  . nitroGLYCERIN (NITROSTAT) 0.4 MG SL tablet Place 1 tablet (0.4 mg total) under the tongue every 5 (five) minutes as needed.  . isosorbide mononitrate (IMDUR) 30 MG 24 hr tablet Take 1 tablet (30 mg total) by mouth daily.  . rosuvastatin (CRESTOR) 10 MG tablet Take 1 tablet (10 mg total) by mouth daily. (Patient taking differently: Take 10 mg by mouth at bedtime. )   No facility-administered encounter medications on file as of 04/30/2016.     Allergies (verified) Patient has no known allergies.   History: Past Medical History:  Diagnosis Date  . Coronary artery disease   . Hypercholesterolemia   . Hypertension    Past Surgical History:  Procedure Laterality Date  . CARDIAC CATHETERIZATION N/A 01/19/2016   Procedure: Left Heart Cath and Coronary Angiography;  Surgeon: Wellington Hampshire, MD;  Location: [place] [facility];  Service: Cardiovascular;  Laterality: N/A;  . ENDARTERECTOMY Left 02/15/2016   Procedure: ENDARTERECTOMY CAROTID;  Surgeon: Algernon Huxley, MD;  Location: [facility];  Service: Vascular;  Laterality: Left;  . NO PAST SURGERIES     Family History  Problem Relation Age of Onset  . Heart disease Father     MI  . Heart attack Father   . Stroke Mother   . Breast cancer Sister   . Colon cancer Neg Hx   . Prostate cancer Neg Hx    Social History   Occupational History  .  [employer]   Social History Main Topics  . Smoking status: Never Smoker  . Smokeless tobacco: Never Used  . Alcohol use No  . Drug use: No  . Sexual activity: Not Currently   Tobacco Counseling Counseling given: Not Answered   Activities of Daily Living In your present state of health, do you have any difficulty performing the following activities: 04/30/2016 02/16/2016  Hearing? [place]? N -  Difficulty concentrating or making decisions? N -  Walking or climbing stairs? N -  Dressing or bathing? N -  Doing errands, shopping? N N  Preparing Food and eating ? N -  Using the Toilet? N -  In the past six months, have you accidently leaked urine? N -  Do you have problems with loss of bowel control? N -  Managing your Medications? N -  Managing your Finances? N -  Housekeeping or managing your Housekeeping? N -  Some recent data might be hidden    Immunizations and Health Maintenance Immunization History  Administered Date(s) Administered  . Influenza,inj,Quad PF,36+ Mos 01/21/2013, 11/22/2013, 03/09/2015, 02/16/2016  . Pneumococcal Conjugate-13 02/01/2014  . Pneumococcal Polysaccharide-23 04/30/2016  . Td 12/21/2012   There are no preventive care reminders to display for this patient.  Patient Care Team: Einar Pheasant, MD as PCP - General (Internal Medicine) Einar Pheasant, MD (Internal Medicine) Robert Bellow, MD (General Surgery)  Indicate any recent Medical Services you may have received from other than [facility] providers in the past year (date may be approximate).      Assessment:   This is a routine wellness examination for Brett Riley. The goal of the wellness visit is to assist the patient how to close the gaps in care and create a preventative care plan for the patient.   Osteoporosis risk reviewed.  Medications reviewed; taking without issues or barriers.  Safety issues reviewed; smoke detectors in the home. Firearms locked up in the home. Wears seatbelts when driving or riding with others. Patient does wear sunscreen or protective clothing when in direct sunlight. No violence in the home.  Patient is alert, normal appearance, oriented to person/place/and time. Correctly identified the president of the [place], recall of 3/3 objects, and performing simple calculations.  Patient displays appropriate judgement and can read correct time from watch face.  No new identified risk were noted.  No failures at ADL's or IADL's.   BMI- discussed the importance of a healthy diet, water intake and exercise. Educational material provided.   Diet: Breakfast: Cereal  Lunch: [place] sandwich on wheat bread Dinner: Dance movement psychotherapist, green vegetable, whole wheat pasta Daily fluid intake: 1 cup of gingerale, 4 cups of water  HTN- followed by PCP.  Dental- every six months.  Eye- Visual acuity not assessed per patient preference since he has regular follow up with his ophthalmologist.  Wears corrective lenses.  Sleep patterns- Sleeps 7-8hours at night.  Wakes feeling rested.  PNA 23 vaccine administered, tolerated well. Educational material provided.   Health maintenance gaps- closed.  Patient Concerns: None at this time. Follow up with PCP as needed.  Hearing/Vision screen Hearing Screening Comments: Followed by [facility] Hearing aid, bilateral Vision Screening Comments: Followed by Dr. Atilano Median, [facility] Wears corrective lenses Last OV 2017 Visual acuity not assessed per patient preference since they have regular follow up with the ophthalmologist  Dietary  issues and exercise activities discussed: Current Exercise Habits: Home exercise routine (Works 5 days a week. ), Frequency (Times/Week): 5, Intensity: Moderate  Goals    . Increase physical activity          Stay active and walk for exercise      Depression Screen PHQ 2/9 Scores 04/30/2016 09/07/2015 09/06/2014 07/28/2013  PHQ - 2 Score 0 0 0 0    Fall Risk Fall Risk  04/30/2016 09/07/2015 09/06/2014 07/28/2013 07/26/2012  Falls in the past year? No No No No No    Cognitive Function: MMSE - Mini Mental State Exam 04/30/2016  Orientation to time 5  Orientation to Place 5  Registration 3  Attention/ Calculation 5  Recall 3  Language- name 2 objects 2  Language- repeat 1  Language- follow 3 step command 3  Language- read & follow direction 1  Write a sentence 1  Copy design 1  Total score 30        Screening Tests Health Maintenance  Topic Date Due  . TETANUS/TDAP  12/22/2022  . INFLUENZA  VACCINE  Completed  . PNA vac Low Risk Adult  Completed        Plan:    End of life planning; Advance aging; Advanced directives discussed. Copy of current HCPOA/Living Will requested.    Medicare Attestation I have personally reviewed: The patient's medical and social history Their use of alcohol, tobacco or illicit drugs Their current medications and supplements The patient's functional ability including ADLs,fall risks, home safety risks, cognitive, and hearing and visual impairment Diet and physical activities Evidence for depression   The patient's weight, height, BMI, and visual acuity have been recorded in the chart.  I have made referrals and provided education to the patient based on review of the above and I have provided the patient with a written personalized care plan for preventive services.    During the course of the visit Brett Riley was educated and counseled about the following appropriate screening and preventive services:   Vaccines to include Pneumoccal, Influenza,  Hepatitis B, Td, Zostavax, HCV  Cardiovascular disease-discussed  Nutrition counseling-educational material provided  Prostate cancer screening- last lab 09/08/15, wnl  Patient Instructions (the written plan) were given to the patient.   Varney Biles, LPN   [id number]    Reviewed above information.  Agree with plan.    Dr Nicki Reaper

## 2016-04-30 NOTE — Patient Instructions (Addendum)
  Brett Riley , Thank you for taking time to come for your Medicare Wellness Visit. I appreciate your ongoing commitment to your health goals. Please review the following plan we discussed and let me know if I can assist you in the future.   Follow up with Dr. Nicki Reaper as needed.    Bring a copy of your [place] and/or Living Will to be scanned into chart.  Have a great day!  These are the goals we discussed: Goals    . Increase physical activity          Stay active and walk for exercise       This is a list of the screening recommended for you and due dates:  Health Maintenance  Topic Date Due  . Tetanus Vaccine  12/22/2022  . Flu Shot  Completed  . Pneumonia vaccines  Completed

## 2016-05-10 ENCOUNTER — Encounter: Payer: Self-pay | Admitting: Internal Medicine

## 2016-06-04 ENCOUNTER — Encounter (INDEPENDENT_AMBULATORY_CARE_PROVIDER_SITE_OTHER): Payer: Medicare PPO

## 2016-06-04 ENCOUNTER — Ambulatory Visit (INDEPENDENT_AMBULATORY_CARE_PROVIDER_SITE_OTHER): Payer: Medicare PPO | Admitting: Vascular Surgery

## 2016-07-05 ENCOUNTER — Ambulatory Visit (INDEPENDENT_AMBULATORY_CARE_PROVIDER_SITE_OTHER): Payer: Medicare PPO | Admitting: Vascular Surgery

## 2016-07-05 ENCOUNTER — Ambulatory Visit (INDEPENDENT_AMBULATORY_CARE_PROVIDER_SITE_OTHER): Payer: Medicare PPO

## 2016-07-05 ENCOUNTER — Encounter (INDEPENDENT_AMBULATORY_CARE_PROVIDER_SITE_OTHER): Payer: Self-pay | Admitting: Vascular Surgery

## 2016-07-05 VITALS — BP 141/80 | HR 66 | Resp 16 | Wt 191.8 lb

## 2016-07-05 DIAGNOSIS — E7800 Pure hypercholesterolemia, unspecified: Secondary | ICD-10-CM | POA: Diagnosis not present

## 2016-07-05 DIAGNOSIS — I6523 Occlusion and stenosis of bilateral carotid arteries: Secondary | ICD-10-CM | POA: Diagnosis not present

## 2016-07-05 DIAGNOSIS — I10 Essential (primary) hypertension: Secondary | ICD-10-CM

## 2016-07-05 NOTE — Assessment & Plan Note (Signed)
lipid control important in reducing the progression of atherosclerotic disease. Continue statin therapy  

## 2016-07-05 NOTE — Assessment & Plan Note (Signed)
blood pressure control important in reducing the progression of atherosclerotic disease. On appropriate oral medications.  

## 2016-07-05 NOTE — Progress Notes (Signed)
MRN : [id number]  Brett Riley is a 81 y.o. (02-12-1936) male who presents with chief complaint of  Chief Complaint  Patient presents with  . Follow-up  .  History of Present Illness: Patient returns in follow-up in about 4-5 months after left carotid endarterectomy for high-grade stenosis. He is doing well. He has had some occasional dizziness but no focal neurologic symptoms. His carotid duplex shows 1-39% right ICA stenosis which is stable and a patent left carotid endarterectomy with mildly elevated velocities in the common carotid artery only.  Current Outpatient Prescriptions  Medication Sig Dispense Refill  . aspirin 81 MG tablet Take 81 mg by mouth at bedtime.     . clopidogrel (PLAVIX) 75 MG tablet Take 1 tablet (75 mg total) by mouth daily with breakfast. 30 tablet 5  . metoprolol succinate (TOPROL-XL) 100 MG 24 hr tablet TAKE 1 TABLET EVERY DAY WITH A MEAL (Patient taking differently: Take 100 mg by mouth daily with breakfast. ) 90 tablet 3  . Multiple Vitamins-Iron (MULTIVITAMINS WITH IRON) TABS Take 1 tablet by mouth daily.    . nitroGLYCERIN (NITROSTAT) 0.4 MG SL tablet Place 1 tablet (0.4 mg total) under the tongue every 5 (five) minutes as needed. 25 tablet 6  . rosuvastatin (CRESTOR) 10 MG tablet     . vitamin B-12 (CYANOCOBALAMIN) 1000 MCG tablet Take 1,000 mcg by mouth daily.    . isosorbide mononitrate (IMDUR) 30 MG 24 hr tablet Take 1 tablet (30 mg total) by mouth daily. 30 tablet 6  . rosuvastatin (CRESTOR) 10 MG tablet Take 1 tablet (10 mg total) by mouth daily. (Patient taking differently: Take 10 mg by mouth at bedtime. ) 30 tablet 6   No current facility-administered medications for this visit.     Past Medical History:  Diagnosis Date  . Coronary artery disease   . Hypercholesterolemia   . Hypertension     Past Surgical History:  Procedure Laterality Date  . CARDIAC CATHETERIZATION N/A 01/19/2016   Procedure: Left Heart Cath and Coronary  Angiography;  Surgeon: Wellington Hampshire, MD;  Location: [place] [facility];  Service: Cardiovascular;  Laterality: N/A;  . ENDARTERECTOMY Left 02/15/2016   Procedure: ENDARTERECTOMY CAROTID;  Surgeon: Algernon Huxley, MD;  Location: [facility];  Service: Vascular;  Laterality: Left;  . NO PAST SURGERIES      Social History Social History  Substance Use Topics  . Smoking status: Never Smoker  . Smokeless tobacco: Never Used  . Alcohol use No    Family History Family History  Problem Relation Age of Onset  . Heart disease Father     MI  . Heart attack Father   . Stroke Mother   . Breast cancer Sister   . Colon cancer Neg Hx   . Prostate cancer Neg Hx     No Known Allergies   REVIEW OF SYSTEMS (Negative unless checked)  Constitutional: [] Weight loss  [] Fever  [] Chills Cardiac: [] Chest pain   [] Chest pressure   [] Palpitations   [] Shortness of breath when laying flat   [] Shortness of breath at rest   [] Shortness of breath with exertion. Vascular:  [] Pain in legs with walking   [] Pain in legs at rest   [] Pain in legs when laying flat   [] Claudication   [] Pain in feet when walking  [] Pain in feet at rest  [] Pain in feet when laying flat   [] History of DVT   [] Phlebitis   [] Swelling in legs   []   Varicose veins   [] Non-healing ulcers Pulmonary:   [] Uses home oxygen   [] Productive cough   [] Hemoptysis   [] Wheeze  [] COPD   [] Asthma Neurologic:  [x] Dizziness  [] Blackouts   [] Seizures   [] History of stroke   [] History of TIA  [] Aphasia   [] Temporary blindness   [] Dysphagia   [] Weakness or numbness in arms   [] Weakness or numbness in legs Musculoskeletal:  [x] Arthritis   [] Joint swelling   [] Joint pain   [] Low back pain Hematologic:  [] Easy bruising  [] Easy bleeding   [] Hypercoagulable state   [] Anemic  [] Hepatitis Gastrointestinal:  [] Blood in stool   [] Vomiting blood  [] Gastroesophageal reflux/heartburn   [] Difficulty swallowing. Genitourinary:  [] Chronic kidney disease   [] Difficult urination   [] Frequent urination  [] Burning with urination   [] Blood in urine Skin:  [] Rashes   [] Ulcers   [] Wounds Psychological:  [] History of anxiety   []  History of major depression.  Physical Examination  Vitals:   07/05/16 1157  BP: (!) 141/80  Pulse: 66  Resp: 16  Weight: 191 lb 12.8 oz (87 kg)   Body mass index is 26.75 kg/m. Gen:  WD/WN, NAD. Appears younger than stated age Head: [place]/AT, No temporalis wasting. Ear/Nose/Throat: Hearing grossly intact, nares w/o erythema or drainage, trachea midline Eyes: Conjunctiva clear. Sclera non-icteric Neck: Supple.  No JVD.  Pulmonary:  Good air movement, equal and clear to auscultation bilaterally.  Cardiac: RRR, normal S1, S2, no Murmurs, rubs or gallops. Vascular:  Vessel Right Left  Radial Palpable Palpable  Ulnar Palpable Palpable  Brachial Palpable Palpable  Carotid Palpable, without bruit Palpable, with soft bruit                       Gastrointestinal: soft, non-tender/non-distended.  Musculoskeletal: M/S 5/5 throughout.  No deformity or atrophy.  Neurologic: CN 2-12 intact. Sensation grossly intact in extremities.  Symmetrical.  Speech is fluent. Motor exam as listed above. Psychiatric: Judgment intact, Mood & affect appropriate for pt's clinical situation. Dermatologic: No rashes or ulcers noted.  No cellulitis or open wounds.      CBC Lab Results  Component Value Date   WBC 5.4 03/11/2016   HGB 14.2 03/11/2016   HCT 41.3 03/11/2016   MCV 95.5 03/11/2016   PLT 178.0 03/11/2016    BMET    Component Value Date/Time   NA 140 03/11/2016 0832   K 4.3 03/11/2016 0832   CL 103 03/11/2016 0832   CO2 29 03/11/2016 0832   GLUCOSE 94 03/11/2016 0832   BUN 19 03/11/2016 0832   CREATININE 0.97 03/11/2016 0832   CALCIUM 9.9 03/11/2016 0832   GFRNONAA >60 02/16/2016 0439   GFRAA >60 02/16/2016 0439   CrCl cannot be calculated (Patient's most recent lab result is older than the maximum 21 days allowed.).  COAG Lab  Results  Component Value Date   INR 1.08 02/13/2016   INR 0.97 01/18/2016    Radiology No results found.    Assessment/Plan Essential hypertension, benign blood pressure control important in reducing the progression of atherosclerotic disease. On appropriate oral medications.   Hypercholesterolemia lipid control important in reducing the progression of atherosclerotic disease. Continue statin therapy   Carotid stenosis His carotid duplex shows 1-39% right ICA stenosis which is stable and a patent left carotid endarterectomy with mildly elevated velocities in the common carotid artery only. He is doing well. Continue aspirin, Plavix, and Crestor. Recheck in 6 months.    Leotis Pain, MD  07/05/2016 12:21  PM    This note was created with Dragon medical transcription system.  Any errors from dictation are purely unintentional

## 2016-07-05 NOTE — Assessment & Plan Note (Signed)
His carotid duplex shows 1-39% right ICA stenosis which is stable and a patent left carotid endarterectomy with mildly elevated velocities in the common carotid artery only. He is doing well. Continue aspirin, Plavix, and Crestor. Recheck in 6 months.

## 2016-07-08 ENCOUNTER — Telehealth: Payer: Self-pay | Admitting: Internal Medicine

## 2016-07-08 ENCOUNTER — Telehealth: Payer: Self-pay | Admitting: Cardiology

## 2016-07-08 NOTE — Telephone Encounter (Signed)
Spoke w/ pt's wife.  She reports that pt has been having dizzy spells for the past 2-3 weeks. She reports that pt will be standing still and develop dizziness. Pt has lost 20 lbs due to diet restrictions. Dr. Lucky Cowboy recently performed carotid endarterectomy in Dec. Pt contacted PCP and was advised to contact our office.  She is concerned that pt's BP meds need to be adjusted. Pt checked his BP 4 times yesterday, but does not remember what they were. Pt reports BP when he was dizzy: 106/78.  Advised her that I will make Dr. Yvone Neu aware of her concerns and call back w/ her recommendation.  She will hold pt's metoprolol if readings are low before giving meds.

## 2016-07-08 NOTE — Telephone Encounter (Signed)
Please confirm with pt how much he weighs now.  In reviewing vascular note, weight 191 pounds.  Need to know if he is eating regular meals.  Any other symptoms, such as vomiting, nausea, pain, etc?  Which medication did he stop?  He is on imdur and metoprolol which both can affect blood pressure.   Just let me know.  Also, recommend if he is having dizziness, to notify cardiology as well.

## 2016-07-08 NOTE — Telephone Encounter (Signed)
Spoke with patients wife patient he hasn't weighed today.   He is experiencing pain under both side of ribs thinks that it maybe due to using weed eater.   Patient has been eating regular meals except select , however eating [place], trout and chicken .   Denies nausea and vomiting denies chest pain  . Stopped metoprolol, isosorbide but went ahead and took today.   Will contact cardiology and notify about dizziness.

## 2016-07-08 NOTE — Telephone Encounter (Signed)
Reason for call:dizziness Symptoms: dizziness upon standing from sitting position, sometimes standing and turning , /72,  Some BP readings 129/68, 140/72, 106/80 when having dizzy spell ,lost around 20 lbs , patient hasn't taken BP meds in 2 days and isn't dizzy  Duration 2-3 week worsening , Medications:  Last seen for this problem: 04/26/16 CPE, 03/11/16 follow up,  seen for cartoid artery blockage Dr Lucky Cowboy corrected,  Seen by: Durward Fortes Dr Nicki Reaper was out of office today and maybe tomorrow before returning call

## 2016-07-08 NOTE — Telephone Encounter (Signed)
Pt spouse called and stated that the pt is c/o dizziness, losing weight and pain under right side. They are not sure if maybe the bp medication is causing the dizziness, his diet has changed.Please advise, thank you!  Call spouse @ [phone]

## 2016-07-08 NOTE — Telephone Encounter (Signed)
Patient wife Lannette Donath advised of below and verbalized an understanding .  She will contact us back after she hears from cardiology.

## 2016-07-08 NOTE — Telephone Encounter (Signed)
If his blood pressure is low, then have him remain off imdur and remain on metoprolol until hears from cardiology.  Monitor weight.  Will need appt for reevaluation pending cardiology recs and if persistent symptoms.

## 2016-07-08 NOTE — Telephone Encounter (Signed)
Pt spouse calling stating for about 2-3 w pt has been having dizzy spells Denies CP/SOB   Has some tightness under ribs, thinks it may be cause by the weed eater while gardening  Last night went to bed, patient could not get comfortable, do to he pains   Would like to know what to do  Please advise.

## 2016-07-08 NOTE — Telephone Encounter (Signed)
Sugest ffup for appropriate eval

## 2016-07-09 NOTE — Telephone Encounter (Signed)
Spoke with patients wife and made her aware of Dr. Tora Kindred recommendations to have him come in for follow up. Appointment scheduled for 07/16/16 at 3:40PM. She was agreeable to this appointment date and time. She did state that his PCP office called them as well and had him stop the isosorbide mononitrate and stay on the Metoprolol. Instructed her to make Dr. Yvone Neu aware when they come in. She verbalized understanding of our conversation and had no further questions at this time.

## 2016-07-16 ENCOUNTER — Encounter: Payer: Self-pay | Admitting: Cardiology

## 2016-07-16 ENCOUNTER — Ambulatory Visit (INDEPENDENT_AMBULATORY_CARE_PROVIDER_SITE_OTHER): Payer: Medicare PPO | Admitting: Cardiology

## 2016-07-16 VITALS — BP 140/82 | HR 81 | Ht 72.0 in | Wt 188.5 lb

## 2016-07-16 DIAGNOSIS — E785 Hyperlipidemia, unspecified: Secondary | ICD-10-CM

## 2016-07-16 DIAGNOSIS — I2510 Atherosclerotic heart disease of native coronary artery without angina pectoris: Secondary | ICD-10-CM

## 2016-07-16 DIAGNOSIS — I10 Essential (primary) hypertension: Secondary | ICD-10-CM

## 2016-07-16 NOTE — Progress Notes (Signed)
Cardiology Office Note   Date:  07/16/2016   ID:  Brett Riley, DOB 1935/12/22, MRN [id number]  Referring Doctor:  Einar Pheasant, MD Dr. Lucky Cowboy  Cardiologist:   Wende Bushy, MD   Reason for consultation:  Chief Complaint  Patient presents with  . other    6 month follow up. Meds reviewed by the pt. verbally. Pt. has not experienced any dizziness since he stopped the Imdur.       History of Present Illness: Brett Riley is a 81 y.o. male who presents for ffup for CAD, issues with BP  Since last visit, he has had a few issues with lightheadedness and dizziness. Blood pressure was noted to be running on the lower side low 100s. Since Imdur was discontinued, he has had improvement in symptoms, less frequent episodes. His blood pressures runs in the 120s to 130s. Since December 2017, he has lost approximately 24 pounds from diet change mainly.  He continues to work in the yard, no chest pains or shortness of breath. No PND, orthopnea, edema.  ROS:  Please see the history of present illness. Aside from mentioned under HPI, all other systems are reviewed and negative.    Past Medical History:  Diagnosis Date  . Coronary artery disease   . Hypercholesterolemia   . Hypertension     Past Surgical History:  Procedure Laterality Date  . CARDIAC CATHETERIZATION N/A 01/19/2016   Procedure: Left Heart Cath and Coronary Angiography;  Surgeon: Wellington Hampshire, MD;  Location: [place] [facility];  Service: Cardiovascular;  Laterality: N/A;  . ENDARTERECTOMY Left 02/15/2016   Procedure: ENDARTERECTOMY CAROTID;  Surgeon: Algernon Huxley, MD;  Location: [facility];  Service: Vascular;  Laterality: Left;  . NO PAST SURGERIES       reports that he has never smoked. He has never used smokeless tobacco. He reports that he does not drink alcohol or use drugs.   family history includes Breast cancer in his sister; Heart attack in his father; Heart disease in his father; Stroke in his mother.     Outpatient Medications Prior to Visit  Medication Sig Dispense Refill  . aspirin 81 MG tablet Take 81 mg by mouth at bedtime.     . clopidogrel (PLAVIX) 75 MG tablet Take 1 tablet (75 mg total) by mouth daily with breakfast. 30 tablet 5  . metoprolol succinate (TOPROL-XL) 100 MG 24 hr tablet TAKE 1 TABLET EVERY DAY WITH A MEAL (Patient taking differently: Take 100 mg by mouth daily with breakfast. ) 90 tablet 3  . Multiple Vitamins-Iron (MULTIVITAMINS WITH IRON) TABS Take 1 tablet by mouth daily.    . nitroGLYCERIN (NITROSTAT) 0.4 MG SL tablet Place 1 tablet (0.4 mg total) under the tongue every 5 (five) minutes as needed. 25 tablet 6  . rosuvastatin (CRESTOR) 10 MG tablet     . vitamin B-12 (CYANOCOBALAMIN) 1000 MCG tablet Take 1,000 mcg by mouth daily.    . isosorbide mononitrate (IMDUR) 30 MG 24 hr tablet Take 1 tablet (30 mg total) by mouth daily. 30 tablet 6  . rosuvastatin (CRESTOR) 10 MG tablet Take 1 tablet (10 mg total) by mouth daily. (Patient taking differently: Take 10 mg by mouth at bedtime. ) 30 tablet 6   No facility-administered medications prior to visit.      Allergies: Patient has no known allergies.    PHYSICAL EXAM: VS:  BP 140/82 (BP Location: Left Arm, Patient Position: Sitting, Cuff Size: Normal)  Pulse 81   Ht 6' (1.829 m)   Wt 188 lb 8 oz (85.5 kg)   BMI 25.57 kg/m  , Body mass index is 25.57 kg/m. Wt Readings from Last 3 Encounters:  07/16/16 188 lb 8 oz (85.5 kg)  07/05/16 191 lb 12.8 oz (87 kg)  04/30/16 190 lb 1.9 oz (86.2 kg)    GENERAL:  well developed, well nourished, obese, not in acute distress HEENT: normocephalic, pink conjunctivae, anicteric sclerae, no xanthelasma, normal dentition, oropharynx clear NECK:  no neck vein engorgement, JVP normal, no hepatojugular reflux, carotid upstroke brisk and symmetric, no bruit, no thyromegaly, no lymphadenopathy LUNGS:  good respiratory effort, clear to auscultation bilaterally CV:  PMI not displaced,  no thrills, no lifts, S1 and S2 within normal limits, no palpable S3 or S4, no murmurs, no rubs, no gallops ABD:  Soft, nontender, nondistended, normoactive bowel sounds, no abdominal aortic bruit, no hepatomegaly, no splenomegaly MS: nontender back, no kyphosis, no scoliosis, no joint deformities EXT:  2+ DP/PT pulses, no edema, no varicosities, no cyanosis, no clubbing SKIN: warm, nondiaphoretic, normal turgor, no ulcers NEUROPSYCH: alert, oriented to person, place, and time, sensory/motor grossly intact, normal mood, appropriate affect     Recent Labs: 03/11/2016: ALT 18; BUN 19; Creatinine, Ser 0.97; Hemoglobin 14.2; Platelets 178.0; Potassium 4.3; Sodium 140; TSH 2.56   Lipid Panel    Component Value Date/Time   CHOL 127 03/11/2016 0832   TRIG 89.0 03/11/2016 0832   HDL 42.10 03/11/2016 0832   CHOLHDL 3 03/11/2016 0832   VLDL 17.8 03/11/2016 0832   LDLCALC 67 03/11/2016 0832     Other studies Reviewed:  EKG:  The ekg from 01/11/2016 was personally reviewed by me and it revealed sinus rhythm, 78 BPM.  Additional studies/ records that were reviewed personally reviewed by me today include:  Nuclear stress is 01/18/2016:  Note of 1-2 mm slow upsloping ST depression during peak exercise.  Nuclear imaging revealed evidence of ischemia as described below   Test demonstrated moderately impaired functional capacity. Patient achieved 4.6 METs and reached 101% of maximum predicted heart rate. There was note of exaggerated heart rate response to exercise. No chest pain. Note of 1-2 mm slow upsloping ST depression during peak exercise. No significant arrhythmias.  Nuclear imaging revealed the following with attenuation correction: 1. Small sized defect involving the apical to basal inferior walls. The degree of photon reduction was mild. This was reversible at rest. This is suggestive of ischemia. 2. Small to medium sized defect involving the apical lateral, mid anterolateral and mid  inferolateral walls. The degree photon reduction was mild. This appears reversible at rest. This is suggestive of ischemia  Gated SPECT imaging reveals normal wall motion and thickening with a calculated left ventricular ejection fraction of 57%  Left heart cath 01/19/2016:  The left ventricular systolic function is normal.  LV end diastolic pressure is mildly elevated.  The left ventricular ejection fraction is 55-65% by visual estimate.  Mid RCA lesion, 20 %stenosed.  RPDA lesion, 90 %stenosed.  Ost LM to LM lesion, 40 %stenosed.  Dist LAD lesion, 20 %stenosed.  1st Diag lesion, 95 %stenosed.  Prox LAD to Mid LAD lesion, 70 %stenosed.   1. Significant 2 vessel coronary artery disease. Severe disease involving the mid to distal right PDA and first diagonal. These are not optimal for PCI and risks probably outweigh the benefit. There is moderate left main stenosis and borderline significant disease involving the proximal to mid LAD which is  heavily calcified. 2. Normal LV systolic function and mildly elevated left ventricular end-diastolic pressure.  Recommendations: In the absence of clear anginal symptoms, I recommend aggressive medical therapy. If the patient becomes symptomatic, CABG can be considered given that he has diffuse heavily calcified disease. In terms of preoperative cardiovascular risk, he is considered at moderate risk for carotid endarterectomy. Consider switching to a more potent statin and recommend aggressive treatment of risk factors.    ASSESSMENT AND PLAN:  Hypertension Blood pressure better off of Imdur. Continue metoprolol.  Hyperlipidemia LDL goal is less than 70. PCP following labs. Agree with statin therapy.  CAD, two-vessel disease, 40% left main disease No chest pain or shortness of breath. Continue medical therapy: He is on aspirin, Plavix, metoprolol, statin therapy. Patient to report to our office any new onset shortness of breath,  chest pain. n the event of any onset of symptoms like shortness of breath or chest pain, his option for revascularization will be CABG.  Carotid disease Status post CEA on the left. Patient follows up with Dr. Lucky Cowboy.    Current medicines are reviewed at length with the patient today.  The patient does not have concerns regarding medicines.  Labs/ tests ordered today include:  Orders Placed This Encounter  Procedures  . EKG 12-Lead   Disposition:   FU In 6 months  I spent at least 25 minutes with the patient today and more than 50% of the time was spent counseling the patient and coordinating care.     Signed, Wende Bushy, MD  07/16/2016 4:55 PM    [place]  This note was generated in part with voice recognition software and I apologize for any typographical errors that were not detected and corrected.

## 2016-07-16 NOTE — Patient Instructions (Signed)
Follow-Up: Your physician wants you to follow-up in: 6 months with Dr. Arida. You will receive a reminder letter in the mail two months in advance. If you don't receive a letter, please call our office to schedule the follow-up appointment.  It was a pleasure seeing you today here in the office. Please do not hesitate to give us a call back if you have any further questions. [phone]  Tayia Stonesifer A. RN, BSN     

## 2016-08-02 ENCOUNTER — Ambulatory Visit (INDEPENDENT_AMBULATORY_CARE_PROVIDER_SITE_OTHER): Payer: Medicare PPO | Admitting: Internal Medicine

## 2016-08-02 ENCOUNTER — Encounter: Payer: Self-pay | Admitting: Internal Medicine

## 2016-08-02 ENCOUNTER — Telehealth: Payer: Self-pay | Admitting: Internal Medicine

## 2016-08-02 VITALS — BP 130/78 | HR 78 | Temp 98.3°F | Resp 12 | Wt 188.0 lb

## 2016-08-02 DIAGNOSIS — D696 Thrombocytopenia, unspecified: Secondary | ICD-10-CM

## 2016-08-02 DIAGNOSIS — I10 Essential (primary) hypertension: Secondary | ICD-10-CM

## 2016-08-02 DIAGNOSIS — D649 Anemia, unspecified: Secondary | ICD-10-CM

## 2016-08-02 DIAGNOSIS — R55 Syncope and collapse: Secondary | ICD-10-CM

## 2016-08-02 DIAGNOSIS — E7800 Pure hypercholesterolemia, unspecified: Secondary | ICD-10-CM | POA: Diagnosis not present

## 2016-08-02 DIAGNOSIS — R42 Dizziness and giddiness: Secondary | ICD-10-CM

## 2016-08-02 DIAGNOSIS — R0989 Other specified symptoms and signs involving the circulatory and respiratory systems: Secondary | ICD-10-CM | POA: Diagnosis not present

## 2016-08-02 DIAGNOSIS — I6523 Occlusion and stenosis of bilateral carotid arteries: Secondary | ICD-10-CM | POA: Diagnosis not present

## 2016-08-02 DIAGNOSIS — I2510 Atherosclerotic heart disease of native coronary artery without angina pectoris: Secondary | ICD-10-CM

## 2016-08-02 LAB — CBC WITH DIFFERENTIAL/PLATELET
Basophils Absolute: 0 10*3/uL (ref 0.0–0.1)
Basophils Relative: 0.8 % (ref 0.0–3.0)
Eosinophils Absolute: 0.3 10*3/uL (ref 0.0–0.7)
Eosinophils Relative: 5.2 % — ABNORMAL HIGH (ref 0.0–5.0)
HCT: 38.7 % — ABNORMAL LOW (ref 39.0–52.0)
Hemoglobin: 12.9 g/dL — ABNORMAL LOW (ref 13.0–17.0)
Lymphocytes Relative: 33.9 % (ref 12.0–46.0)
Lymphs Abs: 1.6 10*3/uL (ref 0.7–4.0)
MCHC: 33.4 g/dL (ref 30.0–36.0)
MCV: 98 fl (ref 78.0–100.0)
Monocytes Absolute: 0.4 10*3/uL (ref 0.1–1.0)
Monocytes Relative: 8.6 % (ref 3.0–12.0)
Neutro Abs: 2.5 10*3/uL (ref 1.4–7.7)
Neutrophils Relative %: 51.5 % (ref 43.0–77.0)
Platelets: 130 10*3/uL — ABNORMAL LOW (ref 150.0–400.0)
RBC: 3.95 Mil/uL — ABNORMAL LOW (ref 4.22–5.81)
RDW: 13.5 % (ref 11.5–15.5)
WBC: 4.9 10*3/uL (ref 4.0–10.5)

## 2016-08-02 LAB — BASIC METABOLIC PANEL WITH GFR
BUN: 20 mg/dL (ref 6–23)
CO2: 30 meq/L (ref 19–32)
Calcium: 9.9 mg/dL (ref 8.4–10.5)
Chloride: 103 meq/L (ref 96–112)
Creatinine, Ser: 0.95 mg/dL (ref 0.40–1.50)
GFR: 81.37 mL/min
Glucose, Bld: 89 mg/dL (ref 70–99)
Potassium: 4 meq/L (ref 3.5–5.1)
Sodium: 139 meq/L (ref 135–145)

## 2016-08-02 NOTE — Telephone Encounter (Signed)
Please advise where to schedule.

## 2016-08-02 NOTE — Progress Notes (Signed)
Patient ID: Brett Riley, male   DOB: Nov 28, 1935, 81 y.o.   MRN: [id number]   Subjective:    Patient ID: Brett Riley, male    DOB: Jan 14, 1936, 81 y.o.   MRN: [id number]  HPI  Patient here as a work in with concerns regarding dizziness.  He is accompanied by his daughter.  History obtained from both of them.  He reports his blood pressure has been running low.  Brings in recorded readings.  Reviewed.  Blood pressure averaging - 110/120/60-70.  Has had 98/60s.  He also reports persistent dizziness.  Appears to have worsened over the last week.  Daughter reports that he seemed more unsteady over this past weekend - due to dizziness.  Had to hold on to things to walk - at times.  He reports that he notices when he bends over, etc - sometimes occurs then.  Occurs when up moving around.  Not when sitting or lying down.  He is eating.  Weight is down.  He has adjusted his diet and had previously been trying to lose weight.  Daughter concerned he is not drinking enough fluid.  No nausea or vomiting.  No diarrhea.  No chest pain.  No sob.  Had him decrease his metoprolol to '100mg'$  1/2 tablet q day two days ago.  States he feels better.  No significant dizziness now.    Past Medical History:  Diagnosis Date  . Coronary artery disease   . Hypercholesterolemia   . Hypertension    Past Surgical History:  Procedure Laterality Date  . CARDIAC CATHETERIZATION N/A 01/19/2016   Procedure: Left Heart Cath and Coronary Angiography;  Surgeon: Wellington Hampshire, MD;  Location: [facility];  Service: Cardiovascular;  Laterality: N/A;  . ENDARTERECTOMY Left 02/15/2016   Procedure: ENDARTERECTOMY CAROTID;  Surgeon: Algernon Huxley, MD;  Location: [facility];  Service: Vascular;  Laterality: Left;  . NO PAST SURGERIES     Family History  Problem Relation Age of Onset  . Heart disease Father        MI  . Heart attack Father   . Stroke Mother   . Breast cancer Sister   . Colon cancer Neg Hx   . Prostate  cancer Neg Hx    Social History   Social History  . Marital status: Married    Spouse name: N/A  . Number of children: 1  . Years of education: N/A   Occupational History  .  [employer]   Social History Main Topics  . Smoking status: Never Smoker  . Smokeless tobacco: Never Used  . Alcohol use No  . Drug use: No  . Sexual activity: Not Currently   Other Topics Concern  . None   Social History Narrative  . None    . Outpatient Encounter Prescriptions as of 08/02/2016  Medication Sig  . aspirin 81 MG tablet Take 81 mg by mouth at bedtime.   . clopidogrel (PLAVIX) 75 MG tablet Take 1 tablet (75 mg total) by mouth daily with breakfast.  . metoprolol succinate (TOPROL-XL) 100 MG 24 hr tablet TAKE 1 TABLET EVERY DAY WITH A MEAL (Patient taking differently: Take 50 mg by mouth daily with breakfast. )  . Multiple Vitamins-Iron (MULTIVITAMINS WITH IRON) TABS Take 1 tablet by mouth daily.  . nitroGLYCERIN (NITROSTAT) 0.4 MG SL tablet Place 1 tablet (0.4 mg total) under the tongue every 5 (five) minutes as needed.  . rosuvastatin (CRESTOR) 10 MG tablet   .  vitamin B-12 (CYANOCOBALAMIN) 1000 MCG tablet Take 1,000 mcg by mouth daily.   No facility-administered encounter medications on file as of 08/02/2016.     Review of Systems  Constitutional:       Has adjusted diet.  Lost weight.  Denies any significant fatigue.  Some decreased energy.  Push mowed his yard yesterday.    HENT: Negative for congestion and sinus pressure.   Respiratory: Negative for cough, chest tightness and shortness of breath.   Cardiovascular: Negative for chest pain, palpitations and leg swelling.  Gastrointestinal: Negative for abdominal pain, diarrhea, nausea and vomiting.  Genitourinary: Negative for difficulty urinating and dysuria.  Musculoskeletal: Negative for back pain and joint swelling.  Skin: Negative for color change and rash.  Neurological: Positive for dizziness. Negative for headaches.    Psychiatric/Behavioral: Negative for agitation and dysphoric mood.       Objective:    Physical Exam  Constitutional: He appears well-developed and well-nourished. No distress.  HENT:  Nose: Nose normal.  Mouth/Throat: Oropharynx is clear and moist.  Neck: Neck supple. No thyromegaly present.  Cardiovascular: Normal rate and regular rhythm.   Abdominal bruit  Pulmonary/Chest: Effort normal and breath sounds normal. No respiratory distress.  Abdominal: Soft. Bowel sounds are normal. There is no tenderness.  Musculoskeletal: He exhibits no edema or tenderness.  Lymphadenopathy:    He has no cervical adenopathy.  Skin: No rash noted. No erythema.  Psychiatric: He has a normal mood and affect. His behavior is normal.    BP 130/78 (BP Location: Left Arm, Patient Position: Sitting, Cuff Size: Normal)   Pulse 78   Temp 98.3 F (36.8 C) (Oral)   Resp 12   Wt 188 lb (85.3 kg)   SpO2 97%   BMI 25.50 kg/m  Wt Readings from Last 3 Encounters:  08/02/16 188 lb (85.3 kg)  07/16/16 188 lb 8 oz (85.5 kg)  07/05/16 191 lb 12.8 oz (87 kg)     Lab Results  Component Value Date   WBC 4.9 08/02/2016   HGB 12.9 (L) 08/02/2016   HCT 38.7 (L) 08/02/2016   PLT 130.0 (L) 08/02/2016   GLUCOSE 89 08/02/2016   CHOL 127 03/11/2016   TRIG 89.0 03/11/2016   HDL 42.10 03/11/2016   LDLCALC 67 03/11/2016   ALT 18 03/11/2016   AST 19 03/11/2016   NA 139 08/02/2016   K 4.0 08/02/2016   CL 103 08/02/2016   CREATININE 0.95 08/02/2016   BUN 20 08/02/2016   CO2 30 08/02/2016   TSH 2.56 03/11/2016   PSA 1.00 09/07/2015   INR 1.08 02/13/2016       Assessment & Plan:   Problem List Items Addressed This Visit    Abdominal bruit    Check aortic ultrasound.        Relevant Orders   US Aorta   Anemia    Recheck cbc to confirm normal.        CAD (coronary artery disease)    Had heart cath that revealed 2 vessel disease.  Seeing cardiology.  Has adjusted his diet.  Lost weight.  On  crestor.  No chest pain.  No sob.  Overall feels things are stable from cardiac standpoint.        Carotid stenosis    S/p CEA.  Followed by [facility].  Continue aggressive risk factor modification.  Remains on aspirin and plavix.        Dizziness    Persistent intermittent dizziness.  Worsened recently.  Adjusted  metoprolol two days ago.  Feels better today.  Discussed at length with him today.  Explained could be with diet adjustment and weight loss that he does not need as high of a dose.  Discussed possible inner ear.  Denies room spinning, etc.  No other neurological deficits.  Will check cbc and met b.  Discussed the need to eat regular meals and stay hydrated.  Hold on MRI.  Follow closely.  Get him back in soon to reassess.  Any change or worsening symptoms, will need to be reevaluated.  Pt and daughter comfortable with plan.        Relevant Orders   CBC with Differential/Platelet (Completed)   Basic metabolic panel (Completed)   Essential hypertension, benign    Blood pressure has been low on his checks as outlined.  Called in with pressure of 98/66.  Was asked to cut his metoprolol in 1/2.  He has done this the last two days.  Feels better.  Blood pressure on my check prior to leaving 140s/80.  Have him continue to spot check his pressure.  Follow.  Continue current medication regimen for now.  Follow.  Check metabolic panel.        Hypercholesterolemia    On crestor.  Follow lipid panel and liver function tests.        Thrombocytopenia ([place])    Recheck cbc to confirm stable.         Other Visit Diagnoses    Syncope, unspecified syncope type    -  Primary     I spent 45 minutes with the patient and more than 50% of the time was spent in consultation regarding the above. Time spent obtaining history, discussing current symptoms,doing the examination and discussed treatment plans and plan for further testing and f/u.     Einar Pheasant, MD

## 2016-08-02 NOTE — Telephone Encounter (Signed)
Please hold for cancellation.   If appt does not open up, I will see him during lunch around that time.  Thanks

## 2016-08-02 NOTE — Telephone Encounter (Signed)
Pt needs a 3 week follow up with Dr. Nicki Reaper.  Call pt @ [phone]

## 2016-08-04 ENCOUNTER — Encounter: Payer: Self-pay | Admitting: Internal Medicine

## 2016-08-04 ENCOUNTER — Other Ambulatory Visit: Payer: Self-pay | Admitting: Internal Medicine

## 2016-08-04 DIAGNOSIS — R0989 Other specified symptoms and signs involving the circulatory and respiratory systems: Secondary | ICD-10-CM | POA: Insufficient documentation

## 2016-08-04 DIAGNOSIS — R42 Dizziness and giddiness: Secondary | ICD-10-CM | POA: Insufficient documentation

## 2016-08-04 DIAGNOSIS — D696 Thrombocytopenia, unspecified: Secondary | ICD-10-CM

## 2016-08-04 DIAGNOSIS — D649 Anemia, unspecified: Secondary | ICD-10-CM

## 2016-08-04 NOTE — Assessment & Plan Note (Signed)
Had heart cath that revealed 2 vessel disease.  Seeing cardiology.  Has adjusted his diet.  Lost weight.  On crestor.  No chest pain.  No sob.  Overall feels things are stable from cardiac standpoint.

## 2016-08-04 NOTE — Assessment & Plan Note (Signed)
Check aortic ultrasound 

## 2016-08-04 NOTE — Assessment & Plan Note (Signed)
On crestor.  Follow lipid panel and liver function tests.   

## 2016-08-04 NOTE — Assessment & Plan Note (Signed)
Blood pressure has been low on his checks as outlined.  Called in with pressure of 98/66.  Was asked to cut his metoprolol in 1/2.  He has done this the last two days.  Feels better.  Blood pressure on my check prior to leaving 140s/80.  Have him continue to spot check his pressure.  Follow.  Continue current medication regimen for now.  Follow.  Check metabolic panel.

## 2016-08-04 NOTE — Assessment & Plan Note (Signed)
Recheck cbc to confirm stable.  

## 2016-08-04 NOTE — Assessment & Plan Note (Signed)
Recheck cbc to confirm normal.   

## 2016-08-04 NOTE — Assessment & Plan Note (Signed)
Persistent intermittent dizziness.  Worsened recently.  Adjusted metoprolol two days ago.  Feels better today.  Discussed at length with him today.  Explained could be with diet adjustment and weight loss that he does not need as high of a dose.  Discussed possible inner ear.  Denies room spinning, etc.  No other neurological deficits.  Will check cbc and met b.  Discussed the need to eat regular meals and stay hydrated.  Hold on MRI.  Follow closely.  Get him back in soon to reassess.  Any change or worsening symptoms, will need to be reevaluated.  Pt and daughter comfortable with plan.

## 2016-08-04 NOTE — Assessment & Plan Note (Signed)
S/p CEA.  Followed by [facility].  Continue aggressive risk factor modification.  Remains on aspirin and plavix.   

## 2016-08-04 NOTE — Progress Notes (Signed)
Order placed for f/u labs.  

## 2016-08-09 ENCOUNTER — Encounter (INDEPENDENT_AMBULATORY_CARE_PROVIDER_SITE_OTHER): Payer: Medicare PPO

## 2016-08-15 ENCOUNTER — Other Ambulatory Visit (INDEPENDENT_AMBULATORY_CARE_PROVIDER_SITE_OTHER): Payer: Medicare PPO

## 2016-08-15 DIAGNOSIS — D649 Anemia, unspecified: Secondary | ICD-10-CM

## 2016-08-15 DIAGNOSIS — D696 Thrombocytopenia, unspecified: Secondary | ICD-10-CM | POA: Diagnosis not present

## 2016-08-15 LAB — CBC WITH DIFFERENTIAL/PLATELET
Basophils Absolute: 0 K/uL (ref 0.0–0.1)
Basophils Relative: 0.8 % (ref 0.0–3.0)
Eosinophils Absolute: 0.2 K/uL (ref 0.0–0.7)
Eosinophils Relative: 5.6 % — ABNORMAL HIGH (ref 0.0–5.0)
HCT: 37.7 % — ABNORMAL LOW (ref 39.0–52.0)
Hemoglobin: 12.5 g/dL — ABNORMAL LOW (ref 13.0–17.0)
Lymphocytes Relative: 38.1 % (ref 12.0–46.0)
Lymphs Abs: 1.5 K/uL (ref 0.7–4.0)
MCHC: 33.2 g/dL (ref 30.0–36.0)
MCV: 98.2 fl (ref 78.0–100.0)
Monocytes Absolute: 0.3 K/uL (ref 0.1–1.0)
Monocytes Relative: 8.7 % (ref 3.0–12.0)
Neutro Abs: 1.8 K/uL (ref 1.4–7.7)
Neutrophils Relative %: 46.8 % (ref 43.0–77.0)
Platelets: 141 K/uL — ABNORMAL LOW (ref 150.0–400.0)
RBC: 3.84 Mil/uL — ABNORMAL LOW (ref 4.22–5.81)
RDW: 13.6 % (ref 11.5–15.5)
WBC: 3.8 K/uL — ABNORMAL LOW (ref 4.0–10.5)

## 2016-08-15 LAB — VITAMIN B12: Vitamin B-12: 748 pg/mL (ref 211–911)

## 2016-08-15 LAB — FERRITIN: Ferritin: 190.9 ng/mL (ref 22.0–322.0)

## 2016-08-16 ENCOUNTER — Other Ambulatory Visit: Payer: Self-pay | Admitting: Internal Medicine

## 2016-08-16 DIAGNOSIS — D649 Anemia, unspecified: Secondary | ICD-10-CM

## 2016-08-16 NOTE — Progress Notes (Signed)
Order placed for f/u labs.  

## 2016-08-19 NOTE — Telephone Encounter (Signed)
appt scheduled for 09/23/16 @10 

## 2016-08-22 ENCOUNTER — Other Ambulatory Visit (INDEPENDENT_AMBULATORY_CARE_PROVIDER_SITE_OTHER): Payer: Self-pay | Admitting: Vascular Surgery

## 2016-08-22 ENCOUNTER — Other Ambulatory Visit: Payer: Self-pay

## 2016-08-22 MED ORDER — ROSUVASTATIN CALCIUM 10 MG PO TABS: 10.0000 mg | ORAL_TABLET | Freq: Every day | ORAL | 3 refills | Status: DC

## 2016-08-22 NOTE — Telephone Encounter (Signed)
Please review for Crestor 10 mg

## 2016-09-03 ENCOUNTER — Encounter: Payer: Self-pay | Admitting: Internal Medicine

## 2016-09-03 ENCOUNTER — Ambulatory Visit (INDEPENDENT_AMBULATORY_CARE_PROVIDER_SITE_OTHER): Payer: Medicare PPO | Admitting: Internal Medicine

## 2016-09-03 DIAGNOSIS — I10 Essential (primary) hypertension: Secondary | ICD-10-CM | POA: Diagnosis not present

## 2016-09-03 DIAGNOSIS — R42 Dizziness and giddiness: Secondary | ICD-10-CM | POA: Diagnosis not present

## 2016-09-03 DIAGNOSIS — D649 Anemia, unspecified: Secondary | ICD-10-CM

## 2016-09-03 DIAGNOSIS — D696 Thrombocytopenia, unspecified: Secondary | ICD-10-CM

## 2016-09-03 DIAGNOSIS — I2510 Atherosclerotic heart disease of native coronary artery without angina pectoris: Secondary | ICD-10-CM | POA: Diagnosis not present

## 2016-09-03 DIAGNOSIS — R0989 Other specified symptoms and signs involving the circulatory and respiratory systems: Secondary | ICD-10-CM | POA: Diagnosis not present

## 2016-09-03 DIAGNOSIS — E7800 Pure hypercholesterolemia, unspecified: Secondary | ICD-10-CM | POA: Diagnosis not present

## 2016-09-03 DIAGNOSIS — I6522 Occlusion and stenosis of left carotid artery: Secondary | ICD-10-CM | POA: Diagnosis not present

## 2016-09-03 LAB — CBC WITH DIFFERENTIAL/PLATELET
Basophils Absolute: 0 10*3/uL (ref 0.0–0.1)
Basophils Relative: 0.6 % (ref 0.0–3.0)
Eosinophils Absolute: 0.2 10*3/uL (ref 0.0–0.7)
Eosinophils Relative: 5 % (ref 0.0–5.0)
HCT: 38.2 % — ABNORMAL LOW (ref 39.0–52.0)
Hemoglobin: 12.7 g/dL — ABNORMAL LOW (ref 13.0–17.0)
Lymphocytes Relative: 35.4 % (ref 12.0–46.0)
Lymphs Abs: 1.4 10*3/uL (ref 0.7–4.0)
MCHC: 33.2 g/dL (ref 30.0–36.0)
MCV: 98.8 fl (ref 78.0–100.0)
Monocytes Absolute: 0.4 10*3/uL (ref 0.1–1.0)
Monocytes Relative: 8.9 % (ref 3.0–12.0)
Neutro Abs: 2 10*3/uL (ref 1.4–7.7)
Neutrophils Relative %: 50.1 % (ref 43.0–77.0)
Platelets: 126 10*3/uL — ABNORMAL LOW (ref 150.0–400.0)
RBC: 3.87 Mil/uL — ABNORMAL LOW (ref 4.22–5.81)
RDW: 14 % (ref 11.5–15.5)
WBC: 4 10*3/uL (ref 4.0–10.5)

## 2016-09-03 LAB — IBC PANEL
Iron: 96 ug/dL (ref 42–165)
Saturation Ratios: 28.9 % (ref 20.0–50.0)
Transferrin: 237 mg/dL (ref 212.0–360.0)

## 2016-09-03 NOTE — Progress Notes (Signed)
Patient ID: Brett Riley, male   DOB: Sep 28, 1935, 81 y.o.   MRN: [id number]   Subjective:    Patient ID: Brett Riley, male    DOB: 12-25-1935, 81 y.o.   MRN: [id number]  HPI  Patient here for a scheduled follow up.  He is feeling better.  Adjusted his medication last visit.  No dizziness.  No chest pain.  No headache.  No sob.  No acid reflux.  No abdominal pain.  Bowels moving.  Checking blood pressures.  Outside checks averaging 120-130s/70-80s (recently).  ovderall feels good.  Still working.     Past Medical History:  Diagnosis Date  . Coronary artery disease   . Hypercholesterolemia   . Hypertension    Past Surgical History:  Procedure Laterality Date  . CARDIAC CATHETERIZATION N/A 01/19/2016   Procedure: Left Heart Cath and Coronary Angiography;  Surgeon: Wellington Hampshire, MD;  Location: [place] [facility];  Service: Cardiovascular;  Laterality: N/A;  . ENDARTERECTOMY Left 02/15/2016   Procedure: ENDARTERECTOMY CAROTID;  Surgeon: Algernon Huxley, MD;  Location: [facility];  Service: Vascular;  Laterality: Left;  . NO PAST SURGERIES     Family History  Problem Relation Age of Onset  . Heart disease Father        MI  . Heart attack Father   . Stroke Mother   . Breast cancer Sister   . Colon cancer Neg Hx   . Prostate cancer Neg Hx    Social History   Social History  . Marital status: Married    Spouse name: N/A  . Number of children: 1  . Years of education: N/A   Occupational History  .  [employer]   Social History Main Topics  . Smoking status: Never Smoker  . Smokeless tobacco: Never Used  . Alcohol use No  . Drug use: No  . Sexual activity: Not Currently   Other Topics Concern  . None   Social History Narrative  . None    Outpatient Encounter Prescriptions as of 09/03/2016  Medication Sig  . aspirin 81 MG tablet Take 81 mg by mouth at bedtime.   . clopidogrel (PLAVIX) 75 MG tablet TAKE 1 TABLET BY MOUTH DAILY WITH BREAKFAST  . metoprolol  succinate (TOPROL-XL) 100 MG 24 hr tablet TAKE 1 TABLET EVERY DAY WITH A MEAL (Patient taking differently: Take 50 mg by mouth daily with breakfast. )  . Multiple Vitamins-Iron (MULTIVITAMINS WITH IRON) TABS Take 1 tablet by mouth daily.  . nitroGLYCERIN (NITROSTAT) 0.4 MG SL tablet Place 1 tablet (0.4 mg total) under the tongue every 5 (five) minutes as needed.  . rosuvastatin (CRESTOR) 10 MG tablet Take 1 tablet (10 mg total) by mouth daily.  . vitamin B-12 (CYANOCOBALAMIN) 1000 MCG tablet Take 1,000 mcg by mouth daily.  . [DISCONTINUED] clopidogrel (PLAVIX) 75 MG tablet Take 1 tablet (75 mg total) by mouth daily with breakfast.   No facility-administered encounter medications on file as of 09/03/2016.     Review of Systems  Constitutional: Negative for appetite change and unexpected weight change.  HENT: Negative for congestion and sinus pressure.   Respiratory: Negative for cough, chest tightness and shortness of breath.   Cardiovascular: Negative for chest pain, palpitations and leg swelling.  Gastrointestinal: Negative for abdominal pain, diarrhea, nausea and vomiting.  Genitourinary: Negative for difficulty urinating and dysuria.  Musculoskeletal: Negative for back pain and joint swelling.  Skin: Negative for color change and rash.  Neurological: Negative for  dizziness, light-headedness and headaches.  Psychiatric/Behavioral: Negative for agitation and dysphoric mood.       Objective:    Physical Exam  Constitutional: He appears well-developed and well-nourished. No distress.  HENT:  Nose: Nose normal.  Mouth/Throat: Oropharynx is clear and moist.  Neck: Neck supple. No thyromegaly present.  Cardiovascular: Normal rate and regular rhythm.   Pulmonary/Chest: Effort normal and breath sounds normal. No respiratory distress.  Abdominal: Soft. Bowel sounds are normal. There is no tenderness.  Musculoskeletal: He exhibits no edema or tenderness.  Lymphadenopathy:    He has no  cervical adenopathy.  Skin: No rash noted. No erythema.  Psychiatric: He has a normal mood and affect. His behavior is normal.    BP 124/80 (BP Location: Left Arm, Patient Position: Sitting, Cuff Size: Large)   Pulse 74   Temp 98.5 F (36.9 C) (Oral)   Resp 12   Ht 6' (1.829 m)   Wt 187 lb 9.6 oz (85.1 kg)   SpO2 98%   BMI 25.44 kg/m  Wt Readings from Last 3 Encounters:  09/03/16 187 lb 9.6 oz (85.1 kg)  08/02/16 188 lb (85.3 kg)  07/16/16 188 lb 8 oz (85.5 kg)     Lab Results  Component Value Date   WBC 4.0 09/03/2016   HGB 12.7 (L) 09/03/2016   HCT 38.2 (L) 09/03/2016   PLT 126.0 (L) 09/03/2016   GLUCOSE 89 08/02/2016   CHOL 127 03/11/2016   TRIG 89.0 03/11/2016   HDL 42.10 03/11/2016   LDLCALC 67 03/11/2016   ALT 18 03/11/2016   AST 19 03/11/2016   NA 139 08/02/2016   K 4.0 08/02/2016   CL 103 08/02/2016   CREATININE 0.95 08/02/2016   BUN 20 08/02/2016   CO2 30 08/02/2016   TSH 2.56 03/11/2016   PSA 1.00 09/07/2015   INR 1.08 02/13/2016       Assessment & Plan:   Problem List Items Addressed This Visit    Abdominal bruit    Scheduled for aortic ultrasound.        Anemia    Had GI w/up as outlined in overview.  Recheck cbc.        CAD (coronary artery disease)    Had heart cath that revealed 2 vessel disease.  Followed by cardiology. Has adjusted his diet.  On crestor.  No chest pain. Currently doing well.        Carotid stenosis, asymptomatic, left    S/p left CEA.  Seeing Dr Lucky Cowboy.  Doing well.  Continue risk factor modification.        Dizziness    Not an issues now since adjusting his medication.  Follow pressures.        Essential hypertension, benign    Blood pressure under good control.  Continue same medication regimen.  Follow pressures.  Follow metabolic panel.        Hypercholesterolemia    On crestor.  Low cholesterol diet and exercise.  Follow lipid panel and liver function tests.        Thrombocytopenia ([place])    Recheck cbc  to confirm stable.            Einar Pheasant, MD

## 2016-09-03 NOTE — Progress Notes (Signed)
Pre-visit discussion using our clinic review tool. No additional management support is needed unless otherwise documented below in the visit note.  

## 2016-09-05 ENCOUNTER — Encounter: Payer: Self-pay | Admitting: Internal Medicine

## 2016-09-05 ENCOUNTER — Other Ambulatory Visit: Payer: Self-pay | Admitting: Radiology

## 2016-09-05 ENCOUNTER — Telehealth: Payer: Self-pay | Admitting: Radiology

## 2016-09-05 ENCOUNTER — Other Ambulatory Visit: Payer: Medicare PPO

## 2016-09-05 LAB — FOLATE RBC: RBC Folate: 765 ng/mL

## 2016-09-05 NOTE — Assessment & Plan Note (Signed)
Recheck cbc to confirm stable.  

## 2016-09-05 NOTE — Telephone Encounter (Signed)
Pt coming in for labs today , please place future orders. Thank you.  

## 2016-09-05 NOTE — Assessment & Plan Note (Signed)
S/p left CEA.  Seeing Dr Lucky Cowboy.  Doing well.  Continue risk factor modification.

## 2016-09-05 NOTE — Assessment & Plan Note (Signed)
Not an issues now since adjusting his medication.  Follow pressures.

## 2016-09-05 NOTE — Telephone Encounter (Signed)
Pt had CBC drawn on 09/03/16. Does he still need to come in today? This lab appt was scheduled prior to his recent OV.

## 2016-09-05 NOTE — Assessment & Plan Note (Signed)
Had heart cath that revealed 2 vessel disease.  Followed by cardiology. Has adjusted his diet.  On crestor.  No chest pain. Currently doing well.

## 2016-09-05 NOTE — Assessment & Plan Note (Signed)
Scheduled for aortic ultrasound.

## 2016-09-05 NOTE — Assessment & Plan Note (Signed)
On crestor.  Low cholesterol diet and exercise.  Follow lipid panel and liver function tests.   

## 2016-09-05 NOTE — Assessment & Plan Note (Signed)
Had GI w/up as outlined in overview.  Recheck cbc.

## 2016-09-05 NOTE — Telephone Encounter (Signed)
No.  Does not need labs today.  Had while he was here.

## 2016-09-05 NOTE — Assessment & Plan Note (Signed)
Blood pressure under good control.  Continue same medication regimen.  Follow pressures.  Follow metabolic panel.   

## 2016-09-15 ENCOUNTER — Other Ambulatory Visit: Payer: Self-pay | Admitting: Internal Medicine

## 2016-09-15 DIAGNOSIS — D696 Thrombocytopenia, unspecified: Secondary | ICD-10-CM

## 2016-09-15 NOTE — Progress Notes (Signed)
Order placed for f/u cbc.   

## 2016-09-16 ENCOUNTER — Encounter: Payer: Medicare PPO | Admitting: Internal Medicine

## 2016-09-23 ENCOUNTER — Ambulatory Visit (INDEPENDENT_AMBULATORY_CARE_PROVIDER_SITE_OTHER): Payer: Medicare PPO

## 2016-09-23 ENCOUNTER — Other Ambulatory Visit: Payer: Self-pay | Admitting: Internal Medicine

## 2016-09-23 DIAGNOSIS — R0989 Other specified symptoms and signs involving the circulatory and respiratory systems: Secondary | ICD-10-CM

## 2016-09-24 ENCOUNTER — Ambulatory Visit (INDEPENDENT_AMBULATORY_CARE_PROVIDER_SITE_OTHER): Payer: Medicare PPO | Admitting: Internal Medicine

## 2016-09-24 ENCOUNTER — Encounter: Payer: Self-pay | Admitting: Internal Medicine

## 2016-09-24 VITALS — BP 122/78 | HR 68 | Temp 98.6°F | Resp 12 | Ht 71.0 in | Wt 186.8 lb

## 2016-09-24 DIAGNOSIS — Z1211 Encounter for screening for malignant neoplasm of colon: Secondary | ICD-10-CM | POA: Diagnosis not present

## 2016-09-24 DIAGNOSIS — R0989 Other specified symptoms and signs involving the circulatory and respiratory systems: Secondary | ICD-10-CM

## 2016-09-24 DIAGNOSIS — E7800 Pure hypercholesterolemia, unspecified: Secondary | ICD-10-CM | POA: Diagnosis not present

## 2016-09-24 DIAGNOSIS — I6522 Occlusion and stenosis of left carotid artery: Secondary | ICD-10-CM | POA: Diagnosis not present

## 2016-09-24 DIAGNOSIS — Z125 Encounter for screening for malignant neoplasm of prostate: Secondary | ICD-10-CM | POA: Diagnosis not present

## 2016-09-24 DIAGNOSIS — I10 Essential (primary) hypertension: Secondary | ICD-10-CM

## 2016-09-24 DIAGNOSIS — Z0000 Encounter for general adult medical examination without abnormal findings: Secondary | ICD-10-CM

## 2016-09-24 DIAGNOSIS — R42 Dizziness and giddiness: Secondary | ICD-10-CM

## 2016-09-24 DIAGNOSIS — D649 Anemia, unspecified: Secondary | ICD-10-CM | POA: Diagnosis not present

## 2016-09-24 DIAGNOSIS — Z23 Encounter for immunization: Secondary | ICD-10-CM | POA: Diagnosis not present

## 2016-09-24 DIAGNOSIS — D696 Thrombocytopenia, unspecified: Secondary | ICD-10-CM

## 2016-09-24 DIAGNOSIS — I2510 Atherosclerotic heart disease of native coronary artery without angina pectoris: Secondary | ICD-10-CM | POA: Diagnosis not present

## 2016-09-24 LAB — HEPATIC FUNCTION PANEL
ALT: 17 U/L (ref 0–53)
AST: 18 U/L (ref 0–37)
Albumin: 4.5 g/dL (ref 3.5–5.2)
Alkaline Phosphatase: 75 U/L (ref 39–117)
Bilirubin, Direct: 0.2 mg/dL (ref 0.0–0.3)
Total Bilirubin: 0.9 mg/dL (ref 0.2–1.2)
Total Protein: 6.9 g/dL (ref 6.0–8.3)

## 2016-09-24 LAB — BASIC METABOLIC PANEL WITH GFR
BUN: 19 mg/dL (ref 6–23)
CO2: 31 meq/L (ref 19–32)
Calcium: 10 mg/dL (ref 8.4–10.5)
Chloride: 106 meq/L (ref 96–112)
Creatinine, Ser: 0.85 mg/dL (ref 0.40–1.50)
GFR: 92.47 mL/min
Glucose, Bld: 102 mg/dL — ABNORMAL HIGH (ref 70–99)
Potassium: 4.8 meq/L (ref 3.5–5.1)
Sodium: 143 meq/L (ref 135–145)

## 2016-09-24 LAB — CBC WITH DIFFERENTIAL/PLATELET
Basophils Absolute: 0 10*3/uL (ref 0.0–0.1)
Basophils Relative: 0.6 % (ref 0.0–3.0)
Eosinophils Absolute: 0.2 10*3/uL (ref 0.0–0.7)
Eosinophils Relative: 3.3 % (ref 0.0–5.0)
HCT: 39.8 % (ref 39.0–52.0)
Hemoglobin: 13.4 g/dL (ref 13.0–17.0)
Lymphocytes Relative: 36.9 % (ref 12.0–46.0)
Lymphs Abs: 1.7 10*3/uL (ref 0.7–4.0)
MCHC: 33.8 g/dL (ref 30.0–36.0)
MCV: 99.1 fl (ref 78.0–100.0)
Monocytes Absolute: 0.3 10*3/uL (ref 0.1–1.0)
Monocytes Relative: 7.3 % (ref 3.0–12.0)
Neutro Abs: 2.4 10*3/uL (ref 1.4–7.7)
Neutrophils Relative %: 51.9 % (ref 43.0–77.0)
Platelets: 129 10*3/uL — ABNORMAL LOW (ref 150.0–400.0)
RBC: 4.01 Mil/uL — ABNORMAL LOW (ref 4.22–5.81)
RDW: 13.8 % (ref 11.5–15.5)
WBC: 4.7 10*3/uL (ref 4.0–10.5)

## 2016-09-24 LAB — PSA, MEDICARE: PSA: 0.63 ng/mL (ref 0.10–4.00)

## 2016-09-24 LAB — LIPID PANEL
Cholesterol: 120 mg/dL (ref 0–200)
HDL: 45.4 mg/dL
LDL Cholesterol: 60 mg/dL (ref 0–99)
NonHDL: 75.07
Total CHOL/HDL Ratio: 3
Triglycerides: 74 mg/dL (ref 0.0–149.0)
VLDL: 14.8 mg/dL (ref 0.0–40.0)

## 2016-09-24 MED ORDER — CLOPIDOGREL BISULFATE 75 MG PO TABS: 75.0000 mg | ORAL_TABLET | Freq: Every day | ORAL | 1 refills | Status: DC

## 2016-09-24 MED ORDER — ZOSTER VAC RECOMB ADJUVANTED 50 MCG/0.5ML IM SUSR: 0.5000 mL | Freq: Once | INTRAMUSCULAR | 0 refills | Status: AC

## 2016-09-24 MED ORDER — ROSUVASTATIN CALCIUM 10 MG PO TABS: 10.0000 mg | ORAL_TABLET | Freq: Every day | ORAL | 1 refills | Status: DC

## 2016-09-24 MED ORDER — METOPROLOL SUCCINATE ER 100 MG PO TB24: ORAL_TABLET | ORAL | 3 refills | Status: DC

## 2016-09-24 NOTE — Progress Notes (Signed)
Pre-visit discussion using our clinic review tool. No additional management support is needed unless otherwise documented below in the visit note.  

## 2016-09-24 NOTE — Progress Notes (Signed)
Patient ID: Brett Riley, male   DOB: 03-05-1935, 81 y.o.   MRN: [id number]   Subjective:    Patient ID: Brett Riley, male    DOB: [birth year], 81 y.o.   MRN: [id number]  HPI  Patient here for his physical exam.  States he is doing well.  Feels better.  Stays active.  No chest pain.  No sob.  No acid reflux.  No abdominal pain.  Bowels moving.  Recent lab reviewed with pt.  Slightly decreased hgb.  Has noticed no blood in his stool.  Dizziness better.     Past Medical History:  Diagnosis Date  . Coronary artery disease   . Hypercholesterolemia   . Hypertension    Past Surgical History:  Procedure Laterality Date  . CARDIAC CATHETERIZATION N/A 01/19/2016   Procedure: Left Heart Cath and Coronary Angiography;  Surgeon: Wellington Hampshire, MD;  Location: [facility];  Service: Cardiovascular;  Laterality: N/A;  . ENDARTERECTOMY Left 02/15/2016   Procedure: ENDARTERECTOMY CAROTID;  Surgeon: Algernon Huxley, MD;  Location: [facility];  Service: Vascular;  Laterality: Left;  . NO PAST SURGERIES     Family History  Problem Relation Age of Onset  . Heart disease Father        MI  . Heart attack Father   . Stroke Mother   . Breast cancer Sister   . Colon cancer Neg Hx   . Prostate cancer Neg Hx    Social History   Social History  . Marital status: Married    Spouse name: N/A  . Number of children: 1  . Years of education: N/A   Occupational History  .  [employer]   Social History Main Topics  . Smoking status: Never Smoker  . Smokeless tobacco: Never Used  . Alcohol use No  . Drug use: No  . Sexual activity: Not Currently   Other Topics Concern  . None   Social History Narrative  . None    Outpatient Encounter Prescriptions as of 09/24/2016  Medication Sig  . aspirin 81 MG tablet Take 81 mg by mouth at bedtime.   . clopidogrel (PLAVIX) 75 MG tablet Take 1 tablet (75 mg total) by mouth daily with breakfast.  . metoprolol succinate (TOPROL-XL) 100 MG 24 hr  tablet TAKE 1 TABLET EVERY DAY WITH A MEAL  . Multiple Vitamins-Iron (MULTIVITAMINS WITH IRON) TABS Take 1 tablet by mouth daily.  . nitroGLYCERIN (NITROSTAT) 0.4 MG SL tablet Place 1 tablet (0.4 mg total) under the tongue every 5 (five) minutes as needed.  . rosuvastatin (CRESTOR) 10 MG tablet Take 1 tablet (10 mg total) by mouth daily.  . vitamin B-12 (CYANOCOBALAMIN) 1000 MCG tablet Take 1,000 mcg by mouth daily.  . [DISCONTINUED] clopidogrel (PLAVIX) 75 MG tablet TAKE 1 TABLET BY MOUTH DAILY WITH BREAKFAST  . [DISCONTINUED] metoprolol succinate (TOPROL-XL) 100 MG 24 hr tablet TAKE 1 TABLET EVERY DAY WITH A MEAL (Patient taking differently: Take 50 mg by mouth daily with breakfast. )  . [DISCONTINUED] rosuvastatin (CRESTOR) 10 MG tablet Take 1 tablet (10 mg total) by mouth daily.  . [EXPIRED] Zoster Vac Recomb Adjuvanted (SHINGRIX) injection Inject 0.5 mLs into the muscle once.   No facility-administered encounter medications on file as of 09/24/2016.     Review of Systems  Constitutional: Negative for appetite change and unexpected weight change.  HENT: Negative for congestion and sinus pressure.   Eyes: Negative for pain and visual disturbance.  Respiratory: Negative  for cough, chest tightness and shortness of breath.   Cardiovascular: Negative for chest pain, palpitations and leg swelling.  Gastrointestinal: Negative for abdominal pain, diarrhea, nausea and vomiting.  Genitourinary: Negative for difficulty urinating and dysuria.  Musculoskeletal: Negative for back pain and joint swelling.  Skin: Negative for rash and wound.  Neurological: Negative for dizziness, light-headedness and headaches.  Hematological: Negative for adenopathy. Does not bruise/bleed easily.  Psychiatric/Behavioral: Negative for agitation and dysphoric mood.       Objective:    Physical Exam  Constitutional: He is oriented to person, place, and time. He appears well-developed and well-nourished. No distress.   HENT:  Head: Normocephalic and atraumatic.  Nose: Nose normal.  Mouth/Throat: Oropharynx is clear and moist. No oropharyngeal exudate.  Eyes: Conjunctivae are normal. Right eye exhibits no discharge. Left eye exhibits no discharge.  Neck: Neck supple. No thyromegaly present.  Cardiovascular: Normal rate and regular rhythm.   Pulmonary/Chest: Breath sounds normal. No respiratory distress. He has no wheezes.  Abdominal: Soft. Bowel sounds are normal. There is no tenderness.  Genitourinary:  Genitourinary Comments: Rectal exam:  Heme negative.  No palpable prostate nodule.   Musculoskeletal: He exhibits no edema or tenderness.  Lymphadenopathy:    He has no cervical adenopathy.  Neurological: He is alert and oriented to person, place, and time.  Skin: Skin is warm and dry. No rash noted. No erythema.  Psychiatric: He has a normal mood and affect. His behavior is normal.    BP 122/78 (BP Location: Left Arm, Patient Position: Sitting, Cuff Size: Normal)   Pulse 68   Temp 98.6 F (37 C) (Oral)   Resp 12   Ht 5\' 11"  (1.803 m)   Wt 186 lb 12.8 oz (84.7 kg)   SpO2 96%   BMI 26.05 kg/m  Wt Readings from Last 3 Encounters:  09/24/16 186 lb 12.8 oz (84.7 kg)  09/03/16 187 lb 9.6 oz (85.1 kg)  08/02/16 188 lb (85.3 kg)     Lab Results  Component Value Date   WBC 4.7 09/24/2016   HGB 13.4 09/24/2016   HCT 39.8 09/24/2016   PLT 129.0 (L) 09/24/2016   GLUCOSE 102 (H) 09/24/2016   CHOL 120 09/24/2016   TRIG 74.0 09/24/2016   HDL 45.40 09/24/2016   LDLCALC 60 09/24/2016   ALT 17 09/24/2016   AST 18 09/24/2016   NA 143 09/24/2016   K 4.8 09/24/2016   CL 106 09/24/2016   CREATININE 0.85 09/24/2016   BUN 19 09/24/2016   CO2 31 09/24/2016   TSH 2.56 03/11/2016   PSA 0.63 09/24/2016   INR 1.08 02/13/2016       Assessment & Plan:   Problem List Items Addressed This Visit    Abdominal bruit    Aortic ultrasound yesterday.  Need results.        Anemia    Had GI w/up  previously as outlined.  Recheck cbc.       Relevant Orders   CBC with Differential/Platelet (Completed)   CAD (coronary artery disease)    Previous heart cath revealed 2 vessel disease.  Followed by cardiology.  On crestor.       Relevant Medications   metoprolol succinate (TOPROL-XL) 100 MG 24 hr tablet   rosuvastatin (CRESTOR) 10 MG tablet   Carotid stenosis, asymptomatic, left    S/p left CEA.  Seeing Dr Lucky Cowboy.  Doing well.  Continue risk factor modification.       Relevant Medications   metoprolol succinate (  TOPROL-XL) 100 MG 24 hr tablet   rosuvastatin (CRESTOR) 10 MG tablet   Dizziness    Better with previous adjustments of his medication.  Follow.       Essential hypertension, benign    Blood pressure as outlined.  Continue same medication regimen.  He has not taken his medication the last two days.  Will take regularly.  Follow pressures.  His outside checks have been under good control.  Follow.        Relevant Medications   metoprolol succinate (TOPROL-XL) 100 MG 24 hr tablet   rosuvastatin (CRESTOR) 10 MG tablet   Other Relevant Orders   Basic metabolic panel (Completed)   Health care maintenance    Physical today 09/24/16.  Colonoscopy 08/12/13 - normal.  Check psa with labs today.       Hypercholesterolemia    On crestor.  Follow lipid panel and liver function tests.       Relevant Medications   metoprolol succinate (TOPROL-XL) 100 MG 24 hr tablet   rosuvastatin (CRESTOR) 10 MG tablet   Other Relevant Orders   Hepatic function panel (Completed)   Lipid panel (Completed)   Thrombocytopenia (HCC)    Recheck cbc to confirm stable.        Other Visit Diagnoses    Colon cancer screening    -  Primary   Relevant Orders   Fecal occult blood, imunochemical   Need for shingles vaccine       Prostate cancer screening       Relevant Orders   PSA, Medicare (Completed)   Routine general medical examination at a health care facility       Relevant Orders    Varicella zoster antibody, IgG (Completed)       Einar Pheasant, MD

## 2016-09-24 NOTE — Assessment & Plan Note (Signed)
Physical today 09/24/16.  Colonoscopy 08/12/13 - normal.  Check psa with labs today.

## 2016-09-25 LAB — VARICELLA ZOSTER ANTIBODY, IGG: Varicella IgG: 2174 {index} — ABNORMAL HIGH

## 2016-09-26 NOTE — Assessment & Plan Note (Signed)
Recheck cbc to confirm stable.  

## 2016-09-26 NOTE — Assessment & Plan Note (Signed)
Previous heart cath revealed 2 vessel disease.  Followed by cardiology.  On crestor.

## 2016-09-26 NOTE — Assessment & Plan Note (Signed)
On crestor.  Follow lipid panel and liver function tests.   

## 2016-09-26 NOTE — Assessment & Plan Note (Signed)
S/p left CEA.  Seeing Dr Lucky Cowboy.  Doing well.  Continue risk factor modification.

## 2016-09-26 NOTE — Assessment & Plan Note (Signed)
Had GI w/up previously as outlined.  Recheck cbc.

## 2016-09-26 NOTE — Assessment & Plan Note (Signed)
Better with previous adjustments of his medication.  Follow.

## 2016-09-26 NOTE — Assessment & Plan Note (Signed)
Blood pressure as outlined.  Continue same medication regimen.  He has not taken his medication the last two days.  Will take regularly.  Follow pressures.  His outside checks have been under good control.  Follow.

## 2016-09-26 NOTE — Assessment & Plan Note (Signed)
Aortic ultrasound yesterday.  Need results.

## 2016-09-30 ENCOUNTER — Other Ambulatory Visit: Payer: Self-pay | Admitting: Internal Medicine

## 2016-09-30 DIAGNOSIS — D696 Thrombocytopenia, unspecified: Secondary | ICD-10-CM

## 2016-09-30 NOTE — Progress Notes (Signed)
Order placed for f/u cbc.   

## 2016-11-07 ENCOUNTER — Other Ambulatory Visit (INDEPENDENT_AMBULATORY_CARE_PROVIDER_SITE_OTHER): Payer: Medicare PPO

## 2016-11-07 DIAGNOSIS — D696 Thrombocytopenia, unspecified: Secondary | ICD-10-CM

## 2016-11-07 LAB — CBC WITH DIFFERENTIAL/PLATELET
Basophils Absolute: 0 10*3/uL (ref 0.0–0.1)
Basophils Relative: 0.6 % (ref 0.0–3.0)
Eosinophils Absolute: 0.2 10*3/uL (ref 0.0–0.7)
Eosinophils Relative: 4.3 % (ref 0.0–5.0)
HCT: 36.4 % — ABNORMAL LOW (ref 39.0–52.0)
Hemoglobin: 12.3 g/dL — ABNORMAL LOW (ref 13.0–17.0)
Lymphocytes Relative: 33.9 % (ref 12.0–46.0)
Lymphs Abs: 1.4 10*3/uL (ref 0.7–4.0)
MCHC: 33.7 g/dL (ref 30.0–36.0)
MCV: 98.9 fl (ref 78.0–100.0)
Monocytes Absolute: 0.3 10*3/uL (ref 0.1–1.0)
Monocytes Relative: 7.7 % (ref 3.0–12.0)
Neutro Abs: 2.2 10*3/uL (ref 1.4–7.7)
Neutrophils Relative %: 53.5 % (ref 43.0–77.0)
Platelets: 121 10*3/uL — ABNORMAL LOW (ref 150.0–400.0)
RBC: 3.68 Mil/uL — ABNORMAL LOW (ref 4.22–5.81)
RDW: 13.5 % (ref 11.5–15.5)
WBC: 4 10*3/uL (ref 4.0–10.5)

## 2016-11-12 ENCOUNTER — Other Ambulatory Visit: Payer: Self-pay | Admitting: Internal Medicine

## 2016-11-12 DIAGNOSIS — D649 Anemia, unspecified: Secondary | ICD-10-CM

## 2016-11-12 DIAGNOSIS — D696 Thrombocytopenia, unspecified: Secondary | ICD-10-CM

## 2016-11-12 NOTE — Progress Notes (Signed)
Order placed for hematology referral.  

## 2016-11-13 ENCOUNTER — Telehealth: Payer: Self-pay | Admitting: Internal Medicine

## 2016-11-13 NOTE — Telephone Encounter (Signed)
Called patient answered all questions. I have answered all questions he would like app. When they called to make he told them no. Would like for [place] to call the office back and get them to call him back to make app. If you will let me know where he was being sent I will be happy to call them

## 2016-11-13 NOTE — Telephone Encounter (Signed)
Pt called wanting to know why he has to go see the hematologist. Pt needs more clarification. Please advise?  Call pt @ [phone]. Thank you!

## 2016-11-15 NOTE — Telephone Encounter (Signed)
Message added to referral to hematology. They will call him to schedule

## 2016-12-23 NOTE — Telephone Encounter (Signed)
Vm not set up yet

## 2016-12-23 NOTE — Telephone Encounter (Signed)
Pt wife called stating that pt is not sure why he's going, pt needs more clarification? Please advise?  Call pt @ [phone]. Thank you!

## 2016-12-26 NOTE — Telephone Encounter (Signed)
Called patient back reviewed reason for app with hematology again. App is set for next week. He has follow up with our office in the am as well.

## 2016-12-27 ENCOUNTER — Encounter: Payer: Self-pay | Admitting: Internal Medicine

## 2016-12-27 ENCOUNTER — Ambulatory Visit (INDEPENDENT_AMBULATORY_CARE_PROVIDER_SITE_OTHER): Payer: Medicare PPO | Admitting: Internal Medicine

## 2016-12-27 VITALS — BP 138/84 | HR 86 | Temp 98.6°F | Resp 14 | Ht 71.0 in | Wt 185.2 lb

## 2016-12-27 DIAGNOSIS — I2510 Atherosclerotic heart disease of native coronary artery without angina pectoris: Secondary | ICD-10-CM | POA: Diagnosis not present

## 2016-12-27 DIAGNOSIS — D696 Thrombocytopenia, unspecified: Secondary | ICD-10-CM | POA: Diagnosis not present

## 2016-12-27 DIAGNOSIS — Z23 Encounter for immunization: Secondary | ICD-10-CM

## 2016-12-27 DIAGNOSIS — E7800 Pure hypercholesterolemia, unspecified: Secondary | ICD-10-CM

## 2016-12-27 DIAGNOSIS — I10 Essential (primary) hypertension: Secondary | ICD-10-CM

## 2016-12-27 DIAGNOSIS — D649 Anemia, unspecified: Secondary | ICD-10-CM | POA: Diagnosis not present

## 2016-12-27 DIAGNOSIS — I6522 Occlusion and stenosis of left carotid artery: Secondary | ICD-10-CM

## 2016-12-27 DIAGNOSIS — R0989 Other specified symptoms and signs involving the circulatory and respiratory systems: Secondary | ICD-10-CM | POA: Diagnosis not present

## 2016-12-27 DIAGNOSIS — R42 Dizziness and giddiness: Secondary | ICD-10-CM | POA: Diagnosis not present

## 2016-12-27 LAB — BASIC METABOLIC PANEL WITH GFR
BUN: 20 mg/dL (ref 6–23)
CO2: 32 meq/L (ref 19–32)
Calcium: 9.7 mg/dL (ref 8.4–10.5)
Chloride: 102 meq/L (ref 96–112)
Creatinine, Ser: 0.88 mg/dL (ref 0.40–1.50)
GFR: 88.79 mL/min
Glucose, Bld: 101 mg/dL — ABNORMAL HIGH (ref 70–99)
Potassium: 4.7 meq/L (ref 3.5–5.1)
Sodium: 139 meq/L (ref 135–145)

## 2016-12-27 LAB — LIPID PANEL
Cholesterol: 108 mg/dL (ref 0–200)
HDL: 46.2 mg/dL
LDL Cholesterol: 49 mg/dL (ref 0–99)
NonHDL: 61.98
Total CHOL/HDL Ratio: 2
Triglycerides: 63 mg/dL (ref 0.0–149.0)
VLDL: 12.6 mg/dL (ref 0.0–40.0)

## 2016-12-27 LAB — HEPATIC FUNCTION PANEL
ALT: 16 U/L (ref 0–53)
AST: 20 U/L (ref 0–37)
Albumin: 4.5 g/dL (ref 3.5–5.2)
Alkaline Phosphatase: 74 U/L (ref 39–117)
Bilirubin, Direct: 0.2 mg/dL (ref 0.0–0.3)
Total Bilirubin: 0.9 mg/dL (ref 0.2–1.2)
Total Protein: 7.4 g/dL (ref 6.0–8.3)

## 2016-12-27 NOTE — Progress Notes (Signed)
Patient ID: Brett Riley, male   DOB: 1936-02-25, 81 y.o.   MRN: [id number]   Subjective:    Patient ID: Brett Riley, male    DOB: 08/05/1935, 81 y.o.   MRN: [id number]  HPI  Patient here for a scheduled follow up.  States he is doing well.  Feels good.  Staying active.  States his blood pressure has been doing well.  No chest pain.  No sob.  No acid reflux.  No abdominal pain.  Bowels moving.  Recently found to have decreased platelet count.  Due to see hematology next week.  Still working.  Handling stress.     Past Medical History:  Diagnosis Date  . Coronary artery disease   . Hypercholesterolemia   . Hypertension    Past Surgical History:  Procedure Laterality Date  . CARDIAC CATHETERIZATION N/A 01/19/2016   Procedure: Left Heart Cath and Coronary Angiography;  Surgeon: Wellington Hampshire, MD;  Location: [facility];  Service: Cardiovascular;  Laterality: N/A;  . ENDARTERECTOMY Left 02/15/2016   Procedure: ENDARTERECTOMY CAROTID;  Surgeon: Algernon Huxley, MD;  Location: [facility];  Service: Vascular;  Laterality: Left;  . NO PAST SURGERIES     Family History  Problem Relation Age of Onset  . Heart disease Father        MI  . Heart attack Father   . Stroke Mother   . Breast cancer Sister   . Colon cancer Neg Hx   . Prostate cancer Neg Hx    Social History   Social History  . Marital status: Married    Spouse name: N/A  . Number of children: 1  . Years of education: N/A   Occupational History  .  [employer]   Social History Main Topics  . Smoking status: Never Smoker  . Smokeless tobacco: Never Used  . Alcohol use No  . Drug use: No  . Sexual activity: Not Currently   Other Topics Concern  . None   Social History Narrative  . None    Outpatient Encounter Prescriptions as of 12/27/2016  Medication Sig  . aspirin 81 MG tablet Take 81 mg by mouth at bedtime.   . clopidogrel (PLAVIX) 75 MG tablet Take 1 tablet (75 mg total) by mouth daily with  breakfast.  . metoprolol succinate (TOPROL-XL) 100 MG 24 hr tablet TAKE 1 TABLET EVERY DAY WITH A MEAL  . Multiple Vitamins-Iron (MULTIVITAMINS WITH IRON) TABS Take 1 tablet by mouth daily.  . nitroGLYCERIN (NITROSTAT) 0.4 MG SL tablet Place 1 tablet (0.4 mg total) under the tongue every 5 (five) minutes as needed.  . rosuvastatin (CRESTOR) 10 MG tablet Take 1 tablet (10 mg total) by mouth daily.  . vitamin B-12 (CYANOCOBALAMIN) 1000 MCG tablet Take 1,000 mcg by mouth daily.   No facility-administered encounter medications on file as of 12/27/2016.     Review of Systems  Constitutional: Negative for appetite change and unexpected weight change.  HENT: Negative for congestion and sinus pressure.   Respiratory: Negative for cough, chest tightness and shortness of breath.   Cardiovascular: Negative for chest pain, palpitations and leg swelling.  Gastrointestinal: Negative for abdominal pain, diarrhea, nausea and vomiting.  Genitourinary: Negative for difficulty urinating and dysuria.  Musculoskeletal: Negative for joint swelling and myalgias.  Skin: Negative for color change and rash.  Neurological: Negative for dizziness, light-headedness and headaches.  Psychiatric/Behavioral: Negative for agitation and dysphoric mood.       Objective:  Blood pressure rechecked by me:  138-140/84  Physical Exam  Constitutional: He appears well-developed and well-nourished. No distress.  HENT:  Nose: Nose normal.  Mouth/Throat: Oropharynx is clear and moist.  Neck: Neck supple. No thyromegaly present.  Cardiovascular: Normal rate and regular rhythm.   Pulmonary/Chest: Effort normal and breath sounds normal. No respiratory distress.  Abdominal: Soft. Bowel sounds are normal. There is no tenderness.  Musculoskeletal: He exhibits no edema or tenderness.  Lymphadenopathy:    He has no cervical adenopathy.  Skin: No rash noted. No erythema.  Psychiatric: He has a normal mood and affect. His  behavior is normal.    BP 138/84   Pulse 86   Temp 98.6 F (37 C) (Oral)   Resp 14   Ht 5\' 11"  (1.803 m)   Wt 185 lb 3.2 oz (84 kg)   SpO2 98%   BMI 25.83 kg/m  Wt Readings from Last 3 Encounters:  12/27/16 185 lb 3.2 oz (84 kg)  09/24/16 186 lb 12.8 oz (84.7 kg)  09/03/16 187 lb 9.6 oz (85.1 kg)     Lab Results  Component Value Date   WBC 4.0 11/07/2016   HGB 12.3 (L) 11/07/2016   HCT 36.4 (L) 11/07/2016   PLT 121.0 (L) 11/07/2016   GLUCOSE 101 (H) 12/27/2016   CHOL 108 12/27/2016   TRIG 63.0 12/27/2016   HDL 46.20 12/27/2016   LDLCALC 49 12/27/2016   ALT 16 12/27/2016   AST 20 12/27/2016   NA 139 12/27/2016   K 4.7 12/27/2016   CL 102 12/27/2016   CREATININE 0.88 12/27/2016   BUN 20 12/27/2016   CO2 32 12/27/2016   TSH 2.56 03/11/2016   PSA 0.63 09/24/2016   INR 1.08 02/13/2016       Assessment & Plan:   Problem List Items Addressed This Visit    Abdominal bruit    Reviewed pts ultrasound results after pt left.  Has f/u appt with vascular surgery 01/10/17.  Have them review and determine f/u needed.        Anemia    Colonoscopy 08/12/2013 - normal.  Recent hgb slightly decreased.  Follow.  With low platelets, due to see hematology next week.        CAD (coronary artery disease)    Previous heart catheterization revealed 2 vessel disease.  Followed by cardiology.  On crestor.  Currently with no chest pain or sob.        Carotid stenosis, asymptomatic, left    S/p left ICA CEA.  Followed by Dr Lucky Cowboy.  Continue risk factor modification and daily aspirin.        Dizziness    Dizziness better.  Not a significant issue for him now.  May notice an occasional light headed sensation if he get up too fast.  This resolves within a few seconds and is not a persistent issue.  Discussed importance of slow position changes and movements.  Overall doing better.        Essential hypertension, benign    Blood pressure on recheck improved.  His outside checks are under  good control.  Continue same medication regimen.  Follow pressures.  Follow metabolic panel.        Relevant Orders   Basic metabolic panel (Completed)   Hypercholesterolemia - Primary    On crestor.  Low cholesterol diet and exercise.  Follow lipid panel and liver function tests.        Relevant Orders   Hepatic function panel (Completed)  Lipid panel (Completed)   Thrombocytopenia (HCC)    Persistent decreased.  Scheduled to see hematology next week for further evaluation and w/up.         Other Visit Diagnoses    Encounter for immunization       Relevant Orders   Flu vaccine HIGH DOSE PF (Completed)       Einar Pheasant, MD

## 2016-12-29 ENCOUNTER — Encounter: Payer: Self-pay | Admitting: Internal Medicine

## 2016-12-29 NOTE — Assessment & Plan Note (Signed)
Persistent decreased.  Scheduled to see hematology next week for further evaluation and w/up.

## 2016-12-29 NOTE — Assessment & Plan Note (Signed)
Reviewed pts ultrasound results after pt left.  Has f/u appt with vascular surgery 01/10/17.  Have them review and determine f/u needed.

## 2016-12-29 NOTE — Assessment & Plan Note (Signed)
On crestor.  Low cholesterol diet and exercise.  Follow lipid panel and liver function tests.   

## 2016-12-29 NOTE — Assessment & Plan Note (Signed)
Colonoscopy 08/12/2013 - normal.  Recent hgb slightly decreased.  Follow.  With low platelets, due to see hematology next week.

## 2016-12-29 NOTE — Assessment & Plan Note (Signed)
Previous heart catheterization revealed 2 vessel disease.  Followed by cardiology.  On crestor.  Currently with no chest pain or sob.

## 2016-12-29 NOTE — Assessment & Plan Note (Signed)
S/p left ICA CEA.  Followed by Dr Lucky Cowboy.  Continue risk factor modification and daily aspirin.

## 2016-12-29 NOTE — Assessment & Plan Note (Signed)
Blood pressure on recheck improved.  His outside checks are under good control.  Continue same medication regimen.  Follow pressures.  Follow metabolic panel.

## 2016-12-29 NOTE — Assessment & Plan Note (Addendum)
Dizziness better.  Not a significant issue for him now.  May notice an occasional light headed sensation if he get up too fast.  This resolves within a few seconds and is not a persistent issue.  Discussed importance of slow position changes and movements.  Overall doing better.

## 2016-12-31 ENCOUNTER — Inpatient Hospital Stay: Payer: Medicare PPO | Admitting: Hematology and Oncology

## 2017-01-03 ENCOUNTER — Inpatient Hospital Stay: Payer: Medicare PPO | Admitting: Hematology and Oncology

## 2017-01-03 ENCOUNTER — Encounter: Payer: Self-pay | Admitting: Hematology and Oncology

## 2017-01-03 NOTE — Progress Notes (Deleted)
[facility] day:  01/03/2017  Chief Complaint: Brett Riley is a 81 y.o. male with anemia and thrombocytopenia who is referred in consultation by Dr. Einar Pheasant for assessment and management.  HPI: ***  The patient has had intermittent thrombocytopenia dating back to 01/18/2016. At that time platelet count was 134,000. Hematocrit was normal.  CBC on 03/11/2016 revealed a hematocrit of 41.3, hemoglobin 14.2, platelets 178,000, white count 5400 with an [place] of 3100. Differential was unremarkable. Follow-up platelet counts have remained range between 126,040 1000. Hematocrit has ranged between 37.7 and 39.8.     Last CBC on 11/07/2016 revealed a hematocrit 36.4, hemoglobin 12.3, MCV 98.9, platelets 121,000, white count 4000 with an ANC of 100. Differential was unremarkable.  CMP on 12/27/2016 revealed a creatinine of 0.88, normal calcium, albumin and liver function tests.   He has a history of B12 deficiency.  B12 was 254 on 03/11/2016. He is on oral B12. Follow-up B12 was 748 on 08/15/2016. Ferritin was 190.9 on 08/15/2016.  RBC folate was 765 on 09/03/2016.  Past Medical History:  Diagnosis Date  . Coronary artery disease   . Hypercholesterolemia   . Hypertension     Past Surgical History:  Procedure Laterality Date  . CARDIAC CATHETERIZATION N/A 01/19/2016   Procedure: Left Heart Cath and Coronary Angiography;  Surgeon: Wellington Hampshire, MD;  Location: [facility];  Service: Cardiovascular;  Laterality: N/A;  . ENDARTERECTOMY Left 02/15/2016   Procedure: ENDARTERECTOMY CAROTID;  Surgeon: Algernon Huxley, MD;  Location: [facility];  Service: Vascular;  Laterality: Left;  . NO PAST SURGERIES      Family History  Problem Relation Age of Onset  . Heart disease Father        MI  . Heart attack Father   . Stroke Mother   . Breast cancer Sister   . Colon cancer Neg Hx   . Prostate cancer Neg Hx     Social History:  reports that he  has never smoked. He has never used smokeless tobacco. He reports that he does not drink alcohol or use drugs.  The patient is accompanied by *** alone today.  Allergies: No Known Allergies  Current Medications: Current Outpatient Prescriptions  Medication Sig Dispense Refill  . aspirin 81 MG tablet Take 81 mg by mouth at bedtime.     . clopidogrel (PLAVIX) 75 MG tablet Take 1 tablet (75 mg total) by mouth daily with breakfast. 90 tablet 1  . metoprolol succinate (TOPROL-XL) 100 MG 24 hr tablet TAKE 1 TABLET EVERY DAY WITH A MEAL 90 tablet 3  . Multiple Vitamins-Iron (MULTIVITAMINS WITH IRON) TABS Take 1 tablet by mouth daily.    . nitroGLYCERIN (NITROSTAT) 0.4 MG SL tablet Place 1 tablet (0.4 mg total) under the tongue every 5 (five) minutes as needed. 25 tablet 6  . rosuvastatin (CRESTOR) 10 MG tablet Take 1 tablet (10 mg total) by mouth daily. 90 tablet 1  . vitamin B-12 (CYANOCOBALAMIN) 1000 MCG tablet Take 1,000 mcg by mouth daily.     No current facility-administered medications for this visit.     Review of Systems:  GENERAL:  Feels good.  Active.  No fevers, sweats or weight loss. PERFORMANCE STATUS (ECOG):  *** HEENT:  No visual changes, runny nose, sore throat, mouth sores or tenderness. Lungs: No shortness of breath or cough.  No hemoptysis. Cardiac:  No chest pain, palpitations, orthopnea, or PND. GI:  No nausea,  vomiting, diarrhea, constipation, melena or hematochezia. GU:  No urgency, frequency, dysuria, or hematuria. Musculoskeletal:  No back pain.  No joint pain.  No muscle tenderness. Extremities:  No pain or swelling. Skin:  No rashes or skin changes. Neuro:  No headache, numbness or weakness, balance or coordination issues. Endocrine:  No diabetes, thyroid issues, hot flashes or night sweats. Psych:  No mood changes, depression or anxiety. Pain:  No focal pain. Review of systems:  All other systems reviewed and found to be negative.  Physical Exam: There were no  vitals taken for this visit. GENERAL:  Well developed, well nourished, **man sitting comfortably in the exam room in no acute distress. MENTAL STATUS:  Alert and oriented to person, place and time. HEAD:  *** hair.  Normocephalic, atraumatic, face symmetric, no Cushingoid features. EYES:  *** eyes.  Pupils equal round and reactive to light and accomodation.  No conjunctivitis or scleral icterus. ENT:  Oropharynx clear without lesion.  Tongue normal. Mucous membranes moist.  RESPIRATORY:  Clear to auscultation without rales, wheezes or rhonchi. CARDIOVASCULAR:  Regular rate and rhythm without murmur, rub or gallop. ABDOMEN:  Soft, non-tender, with active bowel sounds, and no hepatosplenomegaly.  No masses. SKIN:  No rashes, ulcers or lesions. EXTREMITIES: No edema, no skin discoloration or tenderness.  No palpable cords. LYMPH NODES: No palpable cervical, supraclavicular, axillary or inguinal adenopathy  NEUROLOGICAL: Unremarkable. PSYCH:  Appropriate.   No visits with results within 3 Day(s) from this visit.  Latest known visit with results is:  Office Visit on 12/27/2016  Component Date Value Ref Range Status  . Total Bilirubin 12/27/2016 0.9  0.2 - 1.2 mg/dL Final  . Bilirubin, Direct 12/27/2016 0.2  0.0 - 0.3 mg/dL Final  . Alkaline Phosphatase 12/27/2016 74  39 - 117 U/L Final  . AST 12/27/2016 20  0 - 37 U/L Final  . ALT 12/27/2016 16  0 - 53 U/L Final  . Total Protein 12/27/2016 7.4  6.0 - 8.3 g/dL Final  . Albumin 12/27/2016 4.5  3.5 - 5.2 g/dL Final  . Cholesterol 12/27/2016 108  0 - 200 mg/dL Final   ATP III Classification       Desirable:  < 200 mg/dL               Borderline High:  200 - 239 mg/dL          High:  > = 240 mg/dL  . Triglycerides 12/27/2016 63.0  0.0 - 149.0 mg/dL Final   Normal:  <150 mg/dLBorderline High:  150 - 199 mg/dL  . HDL 12/27/2016 46.20  >39.00 mg/dL Final  . VLDL 12/27/2016 12.6  0.0 - 40.0 mg/dL Final  . LDL Cholesterol 12/27/2016 49  0 - 99  mg/dL Final  . Total CHOL/HDL Ratio 12/27/2016 2   Final                  Men          Women1/2 Average Risk     3.4          3.3Average Risk          5.0          4.42X Average Risk          9.6          7.13X Average Risk          15.0          11.0                      .  NonHDL 12/27/2016 61.98   Final   NOTE:  Non-HDL goal should be 30 mg/dL higher than patient's LDL goal (i.e. LDL goal of < 70 mg/dL, would have non-HDL goal of < 100 mg/dL)  . Sodium 12/27/2016 139  135 - 145 mEq/L Final  . Potassium 12/27/2016 4.7  3.5 - 5.1 mEq/L Final  . Chloride 12/27/2016 102  96 - 112 mEq/L Final  . CO2 12/27/2016 32  19 - 32 mEq/L Final  . Glucose, Bld 12/27/2016 101* 70 - 99 mg/dL Final  . BUN 12/27/2016 20  6 - 23 mg/dL Final  . Creatinine, Ser 12/27/2016 0.88  0.40 - 1.50 mg/dL Final  . Calcium 12/27/2016 9.7  8.4 - 10.5 mg/dL Final  . GFR 12/27/2016 88.79  >60.00 mL/min Final    Assessment:  SMOKEY MELOTT is a 81 y.o. male ***  Plan: 1.   Labs today:  CBC with diff, platelet count in a blue top tube, retic, ferritin, iron studies, sed rate, myeloma panel, ANA with reflex, hepatitis B core antibody, hepatitis C antibody.  2.   3.   4.    Lequita Asal, MD  01/03/2017, 4:51 AM

## 2017-01-10 ENCOUNTER — Encounter (INDEPENDENT_AMBULATORY_CARE_PROVIDER_SITE_OTHER): Payer: Self-pay | Admitting: Vascular Surgery

## 2017-01-10 ENCOUNTER — Other Ambulatory Visit (INDEPENDENT_AMBULATORY_CARE_PROVIDER_SITE_OTHER): Payer: Medicare PPO

## 2017-01-10 ENCOUNTER — Ambulatory Visit (INDEPENDENT_AMBULATORY_CARE_PROVIDER_SITE_OTHER): Payer: Medicare PPO | Admitting: Vascular Surgery

## 2017-01-10 VITALS — BP 134/78 | HR 74 | Resp 16 | Wt 186.0 lb

## 2017-01-10 DIAGNOSIS — E7800 Pure hypercholesterolemia, unspecified: Secondary | ICD-10-CM

## 2017-01-10 DIAGNOSIS — I6523 Occlusion and stenosis of bilateral carotid arteries: Secondary | ICD-10-CM

## 2017-01-10 DIAGNOSIS — I10 Essential (primary) hypertension: Secondary | ICD-10-CM | POA: Diagnosis not present

## 2017-01-10 NOTE — Progress Notes (Signed)
Subjective:    Patient ID: Brett Riley, male    DOB: 25-Nov-1935, 81 y.o.   MRN: [id number] Chief Complaint  Patient presents with  . Carotid    71mo follow up   Patient presents for a six non-invasive study follow up for carotid stenosis. The stenosis has been followed by surveillance duplexes. The patient underwent a bilateral carotid duplex scan which showed no change from the previous exam on Jul 05, 2016. Duplex is stable with a patent left carotid endarterectomy without evidence of restenosis.  1-39% stenosis of the right proximal internal carotid artery.  Bilateral antegrade vertebral artery flow.  Normal bilateral subclavian artery waveforms.. The patient denies experiencing Amaurosis Fugax, TIA like symptoms or focal motor deficits.    Review of Systems  Constitutional: Negative.   HENT: Negative.   Eyes: Negative.   Respiratory: Negative.   Cardiovascular: Negative.   Gastrointestinal: Negative.   Endocrine: Negative.   Genitourinary: Negative.   Musculoskeletal: Negative.   Skin: Negative.   Allergic/Immunologic: Negative.   Neurological: Negative.   Hematological: Negative.   Psychiatric/Behavioral: Negative.       Objective:   Physical Exam  Constitutional: He is oriented to person, place, and time. He appears well-developed and well-nourished. No distress.  HENT:  Head: Normocephalic and atraumatic.  Eyes: Conjunctivae are normal.  Neck: Normal range of motion.  No carotid artery bruits noted on exam  Cardiovascular: Normal rate, regular rhythm, normal heart sounds and intact distal pulses.  Pulses:      Radial pulses are 2+ on the right side, and 2+ on the left side.  Pulmonary/Chest: Effort normal and breath sounds normal.  Musculoskeletal: Normal range of motion. He exhibits no edema.  Neurological: He is alert and oriented to person, place, and time.  Skin: Skin is warm and dry. He is not diaphoretic.  Psychiatric: He has a normal mood and affect. His  behavior is normal. Judgment and thought content normal.  Vitals reviewed.  BP 134/78 (BP Location: Right Arm)   Pulse 74   Resp 16   Wt 186 lb (84.4 kg)   BMI 25.94 kg/m   Past Medical History:  Diagnosis Date  . Coronary artery disease   . Hypercholesterolemia   . Hypertension    Social History   Socioeconomic History  . Marital status: Married    Spouse name: Not on file  . Number of children: 1  . Years of education: Not on file  . Highest education level: Not on file  Social Needs  . Financial resource strain: Not on file  . Food insecurity - worry: Not on file  . Food insecurity - inability: Not on file  . Transportation needs - medical: Not on file  . Transportation needs - non-medical: Not on file  Occupational History    Employer: [employer]  Tobacco Use  . Smoking status: Never Smoker  . Smokeless tobacco: Never Used  Substance and Sexual Activity  . Alcohol use: No    Alcohol/week: 0.0 oz  . Drug use: No  . Sexual activity: Not Currently  Other Topics Concern  . Not on file  Social History Narrative  . Not on file   Past Surgical History:  Procedure Laterality Date  . NO PAST SURGERIES     Family History  Problem Relation Age of Onset  . Heart disease Father        MI  . Heart attack Father   . Stroke Mother   . Breast  cancer Sister   . Colon cancer Neg Hx   . Prostate cancer Neg Hx    No Known Allergies     Assessment & Plan:  Patient presents for a six non-invasive study follow up for carotid stenosis. The stenosis has been followed by surveillance duplexes. The patient underwent a bilateral carotid duplex scan which showed no change from the previous exam on Jul 05, 2016. Duplex is stable with a patent left carotid endarterectomy without evidence of restenosis.  1-39% stenosis of the right proximal internal carotid artery.  Bilateral antegrade vertebral artery flow.  Normal bilateral subclavian artery waveforms.. The patient denies  experiencing Amaurosis Fugax, TIA like symptoms or focal motor deficits.   1. Bilateral carotid artery stenosis - Stable Studies reviewed with patient. Patient asymptomatic with stable duplex.  No intervention at this time.  Patient to return in 1 year for surveillance carotid duplex. Patient to remain abstinent of tobacco use. I have discussed with the patient at length the risk factors for and pathogenesis of atherosclerotic disease and encouraged a healthy diet, regular exercise regimen and blood pressure / glucose control.  Patient was instructed to contact our office in the interim with problems such as arm / leg weakness or numbness, speech / swallowing difficulty or temporary monocular blindness. The patient expresses their understanding.   - VAS US CAROTID; Future  2. Hypercholesterolemia - Stable Encouraged good control as its slows the progression of atherosclerotic disease  3. Essential hypertension, benign - Stable Encouraged good control as its slows the progression of atherosclerotic disease  Current Outpatient Medications on File Prior to Visit  Medication Sig Dispense Refill  . aspirin 81 MG tablet Take 81 mg by mouth at bedtime.     . clopidogrel (PLAVIX) 75 MG tablet Take 1 tablet (75 mg total) by mouth daily with breakfast. 90 tablet 1  . metoprolol succinate (TOPROL-XL) 100 MG 24 hr tablet TAKE 1 TABLET EVERY DAY WITH A MEAL 90 tablet 3  . Multiple Vitamins-Iron (MULTIVITAMINS WITH IRON) TABS Take 1 tablet by mouth daily.    . nitroGLYCERIN (NITROSTAT) 0.4 MG SL tablet Place 1 tablet (0.4 mg total) under the tongue every 5 (five) minutes as needed. 25 tablet 6  . rosuvastatin (CRESTOR) 10 MG tablet Take 1 tablet (10 mg total) by mouth daily. 90 tablet 1  . vitamin B-12 (CYANOCOBALAMIN) 1000 MCG tablet Take 1,000 mcg by mouth daily.     No current facility-administered medications on file prior to visit.     There are no Patient Instructions on file for this  visit. No Follow-up on file.   Tarrin Menn A Jazmarie Biever, PA-C

## 2017-03-20 ENCOUNTER — Telehealth: Payer: Self-pay | Admitting: Internal Medicine

## 2017-03-20 MED ORDER — ROSUVASTATIN CALCIUM 10 MG PO TABS: 10.0000 mg | ORAL_TABLET | Freq: Every day | ORAL | 0 refills | Status: DC

## 2017-03-20 NOTE — Telephone Encounter (Signed)
Pt would like a 90 day supply of Plavix sent to [facility] in [place] per ins. Rosuvastatin refilled as requested.

## 2017-03-20 NOTE — Telephone Encounter (Signed)
Copied from [place] [id number]. Topic: Quick Communication - Rx Refill/Question >> Mar 20, 2017  3:55 PM Percell Belt A wrote: Medication:clopidogrel (PLAVIX) 75 MG tablet [[id number]] and  rosuvastatin (CRESTOR) 10 MG tablet [[id number]]   Has the patient contacted their pharmacy? No    (Agent: If no, request that the patient contact the pharmacy for the refill.)   Preferred Pharmacy (with phone number or street name): [facility] [place] [facility] Pt would like 90 day supply sent in for both.     Agent: Please be advised that RX refills may take up to 3 business days. We ask that you follow-up with your pharmacy.

## 2017-03-21 ENCOUNTER — Other Ambulatory Visit: Payer: Self-pay

## 2017-03-21 MED ORDER — CLOPIDOGREL BISULFATE 75 MG PO TABS: 75.0000 mg | ORAL_TABLET | Freq: Every day | ORAL | 1 refills | Status: DC

## 2017-03-21 NOTE — Telephone Encounter (Signed)
Please advise 

## 2017-03-21 NOTE — Telephone Encounter (Signed)
rx refilled. Patient aware.

## 2017-03-23 NOTE — Progress Notes (Signed)
[facility]  Telephone:(336) [phone] Fax:(336) [phone]  ID: Brett Riley OB: 06/19/1935  MR#: [id number]  TLX#:[id number]  Patient Care Team: Einar Pheasant, MD as PCP - General (Internal Medicine) Einar Pheasant, MD (Internal Medicine) Bary Castilla Forest Gleason, MD (General Surgery)  CHIEF COMPLAINT: Thrombocytopenia.  INTERVAL HISTORY: Patient is a 82 year old male who was noted to have a persistently decreased platelet count on routine blood work.  He currently feels well and is asymptomatic.  He does not complain of any easy bleeding or bruising.  He has no neurologic complaints.  He has a good appetite and denies weight loss.  He denies any recent fevers or illnesses.  He has no new medications.  He denies any chest pain or shortness of breath.  He has no nausea, vomiting, constipation, or diarrhea.  He has no urinary complaints.  Patient feels at his baseline and offers no specific complaints today.  REVIEW OF SYSTEMS:   Review of Systems  Constitutional: Negative.  Negative for fever, malaise/fatigue and weight loss.  Respiratory: Negative.  Negative for cough and hemoptysis.   Cardiovascular: Negative.  Negative for chest pain and leg swelling.  Gastrointestinal: Negative.  Negative for abdominal pain, blood in stool and melena.  Genitourinary: Negative.  Negative for dysuria.  Musculoskeletal: Negative.   Skin: Negative.  Negative for rash.  Neurological: Negative.  Negative for sensory change and weakness.  Endo/Heme/Allergies: Does not bruise/bleed easily.  Psychiatric/Behavioral: Negative.  The patient is not nervous/anxious.     As per HPI. Otherwise, a complete review of systems is negative.  PAST MEDICAL HISTORY: Past Medical History:  Diagnosis Date  . Coronary artery disease   . Hypercholesterolemia   . Hypertension     PAST SURGICAL HISTORY: Past Surgical History:  Procedure Laterality Date  . CARDIAC CATHETERIZATION N/A 01/19/2016   Procedure: Left Heart Cath and Coronary Angiography;  Surgeon: Wellington Hampshire, MD;  Location: [facility];  Service: Cardiovascular;  Laterality: N/A;  . ENDARTERECTOMY Left 02/15/2016   Procedure: ENDARTERECTOMY CAROTID;  Surgeon: Algernon Huxley, MD;  Location: [facility];  Service: Vascular;  Laterality: Left;  . NO PAST SURGERIES      FAMILY HISTORY: Family History  Problem Relation Age of Onset  . Heart disease Father        MI  . Heart attack Father   . Stroke Mother   . Breast cancer Sister   . Colon cancer Neg Hx   . Prostate cancer Neg Hx     ADVANCED DIRECTIVES (Y/N):  N  HEALTH MAINTENANCE: Social History   Tobacco Use  . Smoking status: Never Smoker  . Smokeless tobacco: Never Used  Substance Use Topics  . Alcohol use: No    Alcohol/week: 0.0 oz  . Drug use: No     Colonoscopy:  PAP:  Bone density:  Lipid panel:  No Known Allergies  Current Outpatient Medications  Medication Sig Dispense Refill  . aspirin 81 MG tablet Take 81 mg by mouth at bedtime.     . clopidogrel (PLAVIX) 75 MG tablet Take 1 tablet (75 mg total) by mouth daily with breakfast. 90 tablet 1  . metoprolol succinate (TOPROL-XL) 100 MG 24 hr tablet TAKE 1 TABLET EVERY DAY WITH A MEAL 90 tablet 3  . Multiple Vitamins-Iron (MULTIVITAMINS WITH IRON) TABS Take 1 tablet by mouth daily.    . nitroGLYCERIN (NITROSTAT) 0.4 MG SL tablet Place 1 tablet (0.4 mg total) under the tongue every 5 (five) minutes as  needed. 25 tablet 6  . rosuvastatin (CRESTOR) 10 MG tablet Take 1 tablet (10 mg total) by mouth daily. 90 tablet 0  . vitamin B-12 (CYANOCOBALAMIN) 1000 MCG tablet Take 1,000 mcg by mouth daily.     No current facility-administered medications for this visit.     OBJECTIVE: Vitals:   03/24/17 1358  BP: (!) 200/98  Pulse: 82  Resp: 18  Temp: 98.1 F (36.7 C)     Body mass index is 26.82 kg/m.    ECOG FS:0 - Asymptomatic  General: Well-developed, well-nourished, no acute  distress. Eyes: Pink conjunctiva, anicteric sclera. HEENT: Normocephalic, moist mucous membranes, clear oropharnyx. Lungs: Clear to auscultation bilaterally. Heart: Regular rate and rhythm. No rubs, murmurs, or gallops. Abdomen: Soft, nontender, nondistended. No organomegaly noted, normoactive bowel sounds. Musculoskeletal: No edema, cyanosis, or clubbing. Neuro: Alert, answering all questions appropriately. Cranial nerves grossly intact. Skin: No rashes or petechiae noted. Psych: Normal affect. Lymphatics: No cervical, calvicular, axillary or inguinal LAD.   LAB RESULTS:  Lab Results  Component Value Date   NA 139 12/27/2016   K 4.7 12/27/2016   CL 102 12/27/2016   CO2 32 12/27/2016   GLUCOSE 101 (H) 12/27/2016   BUN 20 12/27/2016   CREATININE 0.88 12/27/2016   CALCIUM 9.7 12/27/2016   PROT 7.4 12/27/2016   ALBUMIN 4.5 12/27/2016   AST 20 12/27/2016   ALT 16 12/27/2016   ALKPHOS 74 12/27/2016   BILITOT 0.9 12/27/2016   GFRNONAA >60 02/16/2016   GFRAA >60 02/16/2016    Lab Results  Component Value Date   WBC 4.6 03/24/2017   NEUTROABS 2.2 11/07/2016   HGB 13.1 03/24/2017   HCT 38.6 (L) 03/24/2017   MCV 97.2 03/24/2017   PLT 132 (L) 03/24/2017     STUDIES: No results found.  ASSESSMENT: Thrombocytopenia  PLAN:    1. Thrombocytopenia: Patient's platelet count is mild, but persistently low. The remainder of his blood work from today is pending at time of dictation including platelet antibodies, iron stores, and SPEP.  No intervention is needed at this time.  Patient does not require bone marrow biopsy.  He has been instructed to continue his aspirin and Plavix as prescribed.  Return to clinic in 3 months with repeat laboratory work and further evaluation.  Approximately 45 minutes was spent in discussion of which greater than 50% was consultation.  Patient expressed understanding and was in agreement with this plan. He also understands that He can call clinic at  any time with any questions, concerns, or complaints.    Lloyd Huger, MD   03/24/2017 4:50 PM

## 2017-03-24 ENCOUNTER — Inpatient Hospital Stay: Payer: Medicare HMO | Attending: Oncology | Admitting: Oncology

## 2017-03-24 ENCOUNTER — Inpatient Hospital Stay: Payer: Medicare HMO

## 2017-03-24 ENCOUNTER — Other Ambulatory Visit: Payer: Self-pay

## 2017-03-24 ENCOUNTER — Encounter (INDEPENDENT_AMBULATORY_CARE_PROVIDER_SITE_OTHER): Payer: Self-pay

## 2017-03-24 ENCOUNTER — Encounter: Payer: Self-pay | Admitting: Oncology

## 2017-03-24 VITALS — BP 200/98 | HR 82 | Temp 98.1°F | Resp 18 | Wt 192.3 lb

## 2017-03-24 DIAGNOSIS — D696 Thrombocytopenia, unspecified: Secondary | ICD-10-CM | POA: Insufficient documentation

## 2017-03-24 LAB — IRON AND TIBC
Iron: 70 ug/dL (ref 45–182)
Saturation Ratios: 22 % (ref 17.9–39.5)
TIBC: 320 ug/dL (ref 250–450)
UIBC: 251 ug/dL

## 2017-03-24 LAB — LACTATE DEHYDROGENASE: LDH: 137 U/L (ref 98–192)

## 2017-03-24 LAB — CBC
HCT: 38.6 % — ABNORMAL LOW (ref 40.0–52.0)
Hemoglobin: 13.1 g/dL (ref 13.0–18.0)
MCH: 33 pg (ref 26.0–34.0)
MCHC: 34 g/dL (ref 32.0–36.0)
MCV: 97.2 fL (ref 80.0–100.0)
Platelets: 132 10*3/uL — ABNORMAL LOW (ref 150–440)
RBC: 3.97 MIL/uL — ABNORMAL LOW (ref 4.40–5.90)
RDW: 13.3 % (ref 11.5–14.5)
WBC: 4.6 10*3/uL (ref 3.8–10.6)

## 2017-03-24 LAB — FOLATE: Folate: 43 ng/mL

## 2017-03-24 LAB — FERRITIN: Ferritin: 148 ng/mL (ref 24–336)

## 2017-03-24 NOTE — Progress Notes (Signed)
Patient here today for initial evaluation regarding thrombocytopenia.  

## 2017-03-25 LAB — PLATELET ANTIBODY PROFILE
Glycoprotein IV Antibody: NEGATIVE
HLA Ab Ser Ql EIA: NEGATIVE
IA/IIA Antibody: NEGATIVE
IB/IX Antibody: NEGATIVE
IIB/IIIA Antibody: NEGATIVE

## 2017-03-25 LAB — PROTEIN ELECTROPHORESIS, SERUM
A/G Ratio: 1.4 (ref 0.7–1.7)
Albumin ELP: 4 g/dL (ref 2.9–4.4)
Alpha-1-Globulin: 0.2 g/dL (ref 0.0–0.4)
Alpha-2-Globulin: 0.8 g/dL (ref 0.4–1.0)
Beta Globulin: 1 g/dL (ref 0.7–1.3)
Gamma Globulin: 0.9 g/dL (ref 0.4–1.8)
Globulin, Total: 2.9 g/dL (ref 2.2–3.9)
Total Protein ELP: 6.9 g/dL (ref 6.0–8.5)

## 2017-05-01 ENCOUNTER — Ambulatory Visit: Payer: Medicare PPO

## 2017-05-01 ENCOUNTER — Ambulatory Visit (INDEPENDENT_AMBULATORY_CARE_PROVIDER_SITE_OTHER): Payer: Medicare HMO | Admitting: Internal Medicine

## 2017-05-01 ENCOUNTER — Encounter: Payer: Self-pay | Admitting: Internal Medicine

## 2017-05-01 DIAGNOSIS — I2510 Atherosclerotic heart disease of native coronary artery without angina pectoris: Secondary | ICD-10-CM | POA: Diagnosis not present

## 2017-05-01 DIAGNOSIS — E7800 Pure hypercholesterolemia, unspecified: Secondary | ICD-10-CM

## 2017-05-01 DIAGNOSIS — D649 Anemia, unspecified: Secondary | ICD-10-CM | POA: Diagnosis not present

## 2017-05-01 DIAGNOSIS — I6523 Occlusion and stenosis of bilateral carotid arteries: Secondary | ICD-10-CM

## 2017-05-01 DIAGNOSIS — I10 Essential (primary) hypertension: Secondary | ICD-10-CM | POA: Diagnosis not present

## 2017-05-01 DIAGNOSIS — D696 Thrombocytopenia, unspecified: Secondary | ICD-10-CM

## 2017-05-01 MED ORDER — LISINOPRIL 10 MG PO TABS: 10.0000 mg | ORAL_TABLET | Freq: Every day | ORAL | 1 refills | Status: DC

## 2017-05-01 NOTE — Progress Notes (Signed)
Patient ID: Brett Riley, male   DOB: 1935/06/01, 82 y.o.   MRN: [id number]   Subjective:    Patient ID: Brett Riley, male    DOB: 02-Apr-1935, 83 y.o.   MRN: [id number]  HPI  Patient here for a scheduled follow up.  He reports he is doing relatively well.  Staying active.  No headache.  No dizziness or light headedness.  No chest pain.  No sob.  No acid reflux.  No abdominal pain.  Bowels moving.  No urine change.  Saw Dr Grayland Ormond for thrombocytopenia.  W/up as outlined.  Note reviewed.  Felt stable.  Recommended f/u in 3 months.  States blood pressure has been running a little high.  States noticing more pressures in the 150s.     Past Medical History:  Diagnosis Date  . Coronary artery disease   . Hypercholesterolemia   . Hypertension    Past Surgical History:  Procedure Laterality Date  . CARDIAC CATHETERIZATION N/A 01/19/2016   Procedure: Left Heart Cath and Coronary Angiography;  Surgeon: Wellington Hampshire, MD;  Location: [place] [facility];  Service: Cardiovascular;  Laterality: N/A;  . ENDARTERECTOMY Left 02/15/2016   Procedure: ENDARTERECTOMY CAROTID;  Surgeon: Algernon Huxley, MD;  Location: [facility];  Service: Vascular;  Laterality: Left;  . NO PAST SURGERIES     Family History  Problem Relation Age of Onset  . Heart disease Father        MI  . Heart attack Father   . Stroke Mother   . Breast cancer Sister   . Colon cancer Neg Hx   . Prostate cancer Neg Hx    Social History   Socioeconomic History  . Marital status: Married    Spouse name: None  . Number of children: 1  . Years of education: None  . Highest education level: None  Social Needs  . Financial resource strain: None  . Food insecurity - worry: None  . Food insecurity - inability: None  . Transportation needs - medical: None  . Transportation needs - non-medical: None  Occupational History    Employer: [employer]  Tobacco Use  . Smoking status: Never Smoker  . Smokeless tobacco: Never Used    Substance and Sexual Activity  . Alcohol use: No    Alcohol/week: 0.0 oz  . Drug use: No  . Sexual activity: Not Currently  Other Topics Concern  . None  Social History Narrative  . None    Outpatient Encounter Medications as of 05/01/2017  Medication Sig  . aspirin 81 MG tablet Take 81 mg by mouth at bedtime.   . clopidogrel (PLAVIX) 75 MG tablet Take 1 tablet (75 mg total) by mouth daily with breakfast.  . metoprolol succinate (TOPROL-XL) 100 MG 24 hr tablet TAKE 1 TABLET EVERY DAY WITH A MEAL  . Multiple Vitamins-Iron (MULTIVITAMINS WITH IRON) TABS Take 1 tablet by mouth daily.  . nitroGLYCERIN (NITROSTAT) 0.4 MG SL tablet Place 1 tablet (0.4 mg total) under the tongue every 5 (five) minutes as needed.  . rosuvastatin (CRESTOR) 10 MG tablet Take 1 tablet (10 mg total) by mouth daily.  . vitamin B-12 (CYANOCOBALAMIN) 1000 MCG tablet Take 1,000 mcg by mouth daily.  Marland Kitchen lisinopril (PRINIVIL,ZESTRIL) 10 MG tablet Take 1 tablet (10 mg total) by mouth daily.   No facility-administered encounter medications on file as of 05/01/2017.     Review of Systems  Constitutional: Negative for appetite change and unexpected weight change.  HENT:  Negative for congestion and sinus pressure.   Respiratory: Negative for cough, chest tightness and shortness of breath.   Cardiovascular: Negative for chest pain, palpitations and leg swelling.  Gastrointestinal: Negative for abdominal pain, diarrhea, nausea and vomiting.  Genitourinary: Negative for difficulty urinating and dysuria.  Musculoskeletal: Negative for joint swelling and myalgias.  Skin: Negative for color change and rash.  Neurological: Negative for dizziness, light-headedness and headaches.  Psychiatric/Behavioral: Negative for agitation and dysphoric mood.       Objective:     Blood pressure rechecked by me:  154/86  Physical Exam  Constitutional: He appears well-developed and well-nourished. No distress.  HENT:  Nose: Nose  normal.  Mouth/Throat: Oropharynx is clear and moist.  Neck: Neck supple. No thyromegaly present.  Cardiovascular: Normal rate and regular rhythm.  Pulmonary/Chest: Effort normal and breath sounds normal. No respiratory distress.  Abdominal: Soft. Bowel sounds are normal. There is no tenderness.  Musculoskeletal: He exhibits no edema or tenderness.  Lymphadenopathy:    He has no cervical adenopathy.  Skin: No rash noted. No erythema.  Psychiatric: He has a normal mood and affect. His behavior is normal.    BP (!) 154/86   Pulse 86   Temp 97.8 F (36.6 C) (Oral)   Resp 18   Wt 192 lb 9.6 oz (87.4 kg)   SpO2 97%   BMI 26.86 kg/m  Wt Readings from Last 3 Encounters:  05/01/17 192 lb 9.6 oz (87.4 kg)  03/24/17 192 lb 4.8 oz (87.2 kg)  01/10/17 186 lb (84.4 kg)     Lab Results  Component Value Date   WBC 4.6 03/24/2017   HGB 13.1 03/24/2017   HCT 38.6 (L) 03/24/2017   PLT 132 (L) 03/24/2017   GLUCOSE 101 (H) 12/27/2016   CHOL 108 12/27/2016   TRIG 63.0 12/27/2016   HDL 46.20 12/27/2016   LDLCALC 49 12/27/2016   ALT 16 12/27/2016   AST 20 12/27/2016   NA 139 12/27/2016   K 4.7 12/27/2016   CL 102 12/27/2016   CREATININE 0.88 12/27/2016   BUN 20 12/27/2016   CO2 32 12/27/2016   TSH 2.56 03/11/2016   PSA 0.63 09/24/2016   INR 1.08 02/13/2016       Assessment & Plan:   Problem List Items Addressed This Visit    Anemia    Last colonoscopy 08/2013.  Recommended f/u in 10 years.  hgb just checked and wnl.  Follow.       CAD (coronary artery disease)    Previous heart catheterization revealed 2 vessel CAD.  Followed by cardiology.  Currently asymptomatic.  On crestor.  Continue risk factor modification.        Relevant Medications   lisinopril (PRINIVIL,ZESTRIL) 10 MG tablet   Carotid stenosis    S/p CEA.  Followed by [facility].  Continue aggressive risk factor modification.  Remains on aspirin and plavix.        Relevant Medications   lisinopril  (PRINIVIL,ZESTRIL) 10 MG tablet   Essential hypertension, benign    Blood pressure remaining elevated.  Start lisinopril 10mg  q day.  Follow pressures.  Check metabolic panel in 98-33 days.        Relevant Medications   lisinopril (PRINIVIL,ZESTRIL) 10 MG tablet   Other Relevant Orders   TSH   Basic metabolic panel   Hypercholesterolemia    On crestor.  Low cholesterol diet and exercise.  Follow lipid panel and liver function tests.        Relevant  Medications   lisinopril (PRINIVIL,ZESTRIL) 10 MG tablet   Other Relevant Orders   Hepatic function panel   Lipid panel   Thrombocytopenia ([place])    Saw hematology.  W/up as per their note.  Felt stable.  Recommended f/u in 3 months.            Einar Pheasant, MD

## 2017-05-01 NOTE — Patient Instructions (Signed)
Start lisinopril 10mg  q day.  Continue the metoprolol 1/2 tablet per day

## 2017-05-04 ENCOUNTER — Encounter: Payer: Self-pay | Admitting: Internal Medicine

## 2017-05-04 NOTE — Assessment & Plan Note (Signed)
Saw hematology.  W/up as per their note.  Felt stable.  Recommended f/u in 3 months.

## 2017-05-04 NOTE — Assessment & Plan Note (Signed)
Previous heart catheterization revealed 2 vessel CAD.  Followed by cardiology.  Currently asymptomatic.  On crestor.  Continue risk factor modification.

## 2017-05-04 NOTE — Assessment & Plan Note (Signed)
Last colonoscopy 08/2013.  Recommended f/u in 10 years.  hgb just checked and wnl.  Follow.

## 2017-05-04 NOTE — Assessment & Plan Note (Signed)
On crestor.  Low cholesterol diet and exercise.  Follow lipid panel and liver function tests.   

## 2017-05-04 NOTE — Assessment & Plan Note (Signed)
Blood pressure remaining elevated.  Start lisinopril 10mg  q day.  Follow pressures.  Check metabolic panel in 33-35 days.

## 2017-05-04 NOTE — Assessment & Plan Note (Signed)
S/p CEA.  Followed by [facility].  Continue aggressive risk factor modification.  Remains on aspirin and plavix.

## 2017-05-06 ENCOUNTER — Ambulatory Visit (INDEPENDENT_AMBULATORY_CARE_PROVIDER_SITE_OTHER): Payer: Medicare HMO

## 2017-05-06 VITALS — BP 142/72 | HR 77 | Temp 98.3°F | Resp 14 | Ht 71.0 in | Wt 191.8 lb

## 2017-05-06 DIAGNOSIS — Z0000 Encounter for general adult medical examination without abnormal findings: Secondary | ICD-10-CM

## 2017-05-06 NOTE — Progress Notes (Addendum)
Subjective:   TU BAYLE is a 82 y.o. male who presents for Medicare Annual/Subsequent preventive examination.  Review of Systems:  No ROS.  Medicare Wellness Visit. Additional risk factors are reflected in the social history.  Cardiac Risk Factors include: advanced age (>36men, >73 women);male gender;hypertension     Objective:    Vitals: BP (!) 142/72 (BP Location: Left Arm, Patient Position: Sitting, Cuff Size: Normal)   Pulse 77   Temp 98.3 F (36.8 C) (Oral)   Resp 14   Ht 5\' 11"  (1.803 m)   Wt 191 lb 12.8 oz (87 kg)   BMI 26.75 kg/m   Body mass index is 26.75 kg/m.  Advanced Directives 05/06/2017 03/24/2017 03/24/2017 07/05/2016 04/30/2016 02/15/2016 02/13/2016  Does Patient Have a Medical Advance Directive? Yes Yes Yes Yes Yes Yes Yes  Type of Paramedic of [place];Living will [place];Living will Living will;Healthcare Power of [place];Living will [place];Living will Living will Living will  Does patient want to make changes to medical advance directive? No - Patient declined No - Patient declined - - No - Patient declined No - Patient declined -  Copy of [place] in Chart? No - copy requested No - copy requested - - No - copy requested - -    Tobacco Social History   Tobacco Use  Smoking Status Never Smoker  Smokeless Tobacco Never Used     Counseling given: Not Answered   Clinical Intake:  Pre-visit preparation completed: Yes  Pain : No/denies pain     Nutritional Status: BMI 25 -29 Overweight Diabetes: No  How often do you need to have someone help you when you read instructions, pamphlets, or other written materials from your doctor or pharmacy?: 1 - Never  Interpreter Needed?: No     Past Medical History:  Diagnosis Date  . Coronary artery disease   . Hypercholesterolemia   . Hypertension    Past Surgical History:  Procedure  Laterality Date  . CARDIAC CATHETERIZATION N/A 01/19/2016   Procedure: Left Heart Cath and Coronary Angiography;  Surgeon: Wellington Hampshire, MD;  Location: [place] [facility];  Service: Cardiovascular;  Laterality: N/A;  . ENDARTERECTOMY Left 02/15/2016   Procedure: ENDARTERECTOMY CAROTID;  Surgeon: Algernon Huxley, MD;  Location: [facility];  Service: Vascular;  Laterality: Left;  . NO PAST SURGERIES     Family History  Problem Relation Age of Onset  . Heart disease Father        MI  . Heart attack Father   . Stroke Mother   . Breast cancer Sister   . Colon cancer Neg Hx   . Prostate cancer Neg Hx    Social History   Socioeconomic History  . Marital status: Married    Spouse name: None  . Number of children: 1  . Years of education: None  . Highest education level: None  Social Needs  . Financial resource strain: Not hard at all  . Food insecurity - worry: Never true  . Food insecurity - inability: Never true  . Transportation needs - medical: No  . Transportation needs - non-medical: No  Occupational History    Employer: [employer]  Tobacco Use  . Smoking status: Never Smoker  . Smokeless tobacco: Never Used  Substance and Sexual Activity  . Alcohol use: No    Alcohol/week: 0.0 oz  . Drug use: No  . Sexual activity: Not  Currently  Other Topics Concern  . None  Social History Narrative  . None    Outpatient Encounter Medications as of 05/06/2017  Medication Sig  . aspirin 81 MG tablet Take 81 mg by mouth at bedtime.   . clopidogrel (PLAVIX) 75 MG tablet Take 1 tablet (75 mg total) by mouth daily with breakfast.  . lisinopril (PRINIVIL,ZESTRIL) 10 MG tablet Take 1 tablet (10 mg total) by mouth daily.  . metoprolol succinate (TOPROL-XL) 100 MG 24 hr tablet TAKE 1 TABLET EVERY DAY WITH A MEAL  . Multiple Vitamins-Iron (MULTIVITAMINS WITH IRON) TABS Take 1 tablet by mouth daily.  . nitroGLYCERIN (NITROSTAT) 0.4 MG SL tablet Place 1 tablet (0.4 mg total) under the tongue  every 5 (five) minutes as needed.  . rosuvastatin (CRESTOR) 10 MG tablet Take 1 tablet (10 mg total) by mouth daily.  . vitamin B-12 (CYANOCOBALAMIN) 1000 MCG tablet Take 1,000 mcg by mouth daily.   No facility-administered encounter medications on file as of 05/06/2017.     Activities of Daily Living In your present state of health, do you have any difficulty performing the following activities: 05/06/2017  Hearing? Y  Comment Hearing aids, bilateral  Vision? N  Difficulty concentrating or making decisions? N  Walking or climbing stairs? N  Dressing or bathing? N  Doing errands, shopping? N  Preparing Food and eating ? N  Using the Toilet? N  In the past six months, have you accidently leaked urine? N  Do you have problems with loss of bowel control? N  Managing your Medications? N  Managing your Finances? N  Housekeeping or managing your Housekeeping? N  Some recent data might be hidden    Patient Care Team: Einar Pheasant, MD as PCP - General (Internal Medicine) Einar Pheasant, MD (Internal Medicine) Bary Castilla Forest Gleason, MD (General Surgery)   Assessment:   This is a routine wellness examination for X[place].  The goal of the wellness visit is to assist the patient how to close the gaps in care and create a preventative care plan for the patient.   The roster of all physicians providing medical care to patient is listed in the Snapshot section of the chart.  Osteoporosis risk reviewed.    Safety issues reviewed; Smoke and carbon monoxide detectors in the home. No firearms in the home. Wears seatbelts when driving or riding with others. No violence in the home.  They do not have excessive sun exposure.  Discussed the need for sun protection: hats, long sleeves and the use of sunscreen if there is significant sun exposure.  Patient is alert, normal appearance, oriented to person/place/and time.  Correctly identified the president of the [place] and recalls of 2/3 words. Performs  simple calculations and can read correct time from watch face.  Displays appropriate judgement.  No failures at ADL's or IADL's.   BMI- discussed the importance of a healthy diet, water intake and the benefits of aerobic exercise. Educational material provided.   24 hour diet recall: Low cholesterol/low carb diet.  Daily fluid intake: 2 cups caffeine, 4-6 cups water  Dental- every 6 months.  Eye- Visual acuity not assessed per patient preference since they have regular follow up with the ophthalmologist.  Wears corrective lenses.  Sleeps without issues.   Hypertension- followed by PCP.  BP improved since last taken on 05/01/17 at 154/86.  Started taking new medication x1 day.  Fasting lab scheduled for follow up.  He is monitoring his BP at home.  Health maintenance gaps- closed.  Patient Concerns: None at this time. Follow up with PCP as needed.  Exercise Activities and Dietary recommendations Current Exercise Habits: Home exercise routine, Type of exercise: walking, Time (Minutes): 20, Frequency (Times/Week): 4, Weekly Exercise (Minutes/Week): 80, Intensity: Moderate  Goals    . Maintain Healthy Lifestyle     Maintain weight with exercise and healthy food choices        Fall Risk Fall Risk  05/06/2017 04/30/2016 09/07/2015 09/06/2014 07/28/2013  Falls in the past year? No No No No No     Depression Screen PHQ 2/9 Scores 05/06/2017 09/24/2016 04/30/2016 09/07/2015  PHQ - 2 Score 0 0 0 0  PHQ- 9 Score - 4 - -    Cognitive Function MMSE - Mini Mental State Exam 05/06/2017 04/30/2016  Orientation to time 5 5  Orientation to Place 5 5  Registration 3 3  Attention/ Calculation 5 5  Recall 2 3  Language- name 2 objects 2 2  Language- repeat 1 1  Language- follow 3 step command 3 3  Language- read & follow direction 1 1  Write a sentence 1 1  Copy design 1 1  Total score 29 30        Immunization History  Administered Date(s) Administered  . Influenza, High Dose  Seasonal PF 12/27/2016  . Influenza,inj,Quad PF,6+ Mos 01/21/2013, 11/22/2013, 03/09/2015, 02/16/2016  . Pneumococcal Conjugate-13 02/01/2014  . Pneumococcal Polysaccharide-23 04/30/2016  . Td 12/21/2012    Screening Tests Health Maintenance  Topic Date Due  . TETANUS/TDAP  12/22/2022  . INFLUENZA VACCINE  Completed  . PNA vac Low Risk Adult  Completed      Plan:    End of life planning; Advance aging; Advanced directives discussed. Copy of current HCPOA/Living Will requested.    I have personally reviewed and noted the following in the patient's chart:   . Medical and social history . Use of alcohol, tobacco or illicit drugs  . Current medications and supplements . Functional ability and status . Nutritional status . Physical activity . Advanced directives . List of other physicians . Hospitalizations, surgeries, and ER visits in previous 12 months . Vitals . Screenings to include cognitive, depression, and falls . Referrals and appointments  In addition, I have reviewed and discussed with patient certain preventive protocols, quality metrics, and best practice recommendations. A written personalized care plan for preventive services as well as general preventive health recommendations were provided to patient.     Varney Biles, LPN  0/05/5463  Reviewed above information.  Agree with assessment and plan.  Agree with continuing to monitor blood pressure.    Dr Nicki Reaper

## 2017-05-06 NOTE — Patient Instructions (Addendum)
  Brett Riley , Thank you for taking time to come for your Medicare Wellness Visit. I appreciate your ongoing commitment to your health goals. Please review the following plan we discussed and let me know if I can assist you in the future.   Follow up with Dr. Nicki Reaper as needed.    Bring a copy of your [place] and/or Living Will to be scanned into chart.  Have a great day!  These are the goals we discussed: Goals    . Maintain Healthy Lifestyle     Maintain weight with exercise and healthy food choices        This is a list of the screening recommended for you and due dates:  Health Maintenance  Topic Date Due  . Tetanus Vaccine  12/22/2022  . Flu Shot  Completed  . Pneumonia vaccines  Completed

## 2017-05-12 ENCOUNTER — Other Ambulatory Visit (INDEPENDENT_AMBULATORY_CARE_PROVIDER_SITE_OTHER): Payer: Medicare HMO

## 2017-05-12 DIAGNOSIS — I10 Essential (primary) hypertension: Secondary | ICD-10-CM | POA: Diagnosis not present

## 2017-05-12 DIAGNOSIS — E7800 Pure hypercholesterolemia, unspecified: Secondary | ICD-10-CM | POA: Diagnosis not present

## 2017-05-12 LAB — BASIC METABOLIC PANEL WITH GFR
BUN: 20 mg/dL (ref 6–23)
CO2: 30 meq/L (ref 19–32)
Calcium: 9.6 mg/dL (ref 8.4–10.5)
Chloride: 106 meq/L (ref 96–112)
Creatinine, Ser: 0.93 mg/dL (ref 0.40–1.50)
GFR: 83.22 mL/min
Glucose, Bld: 101 mg/dL — ABNORMAL HIGH (ref 70–99)
Potassium: 4.6 meq/L (ref 3.5–5.1)
Sodium: 140 meq/L (ref 135–145)

## 2017-05-12 LAB — LIPID PANEL
Cholesterol: 92 mg/dL (ref 0–200)
HDL: 40.3 mg/dL
LDL Cholesterol: 41 mg/dL (ref 0–99)
NonHDL: 51.52
Total CHOL/HDL Ratio: 2
Triglycerides: 54 mg/dL (ref 0.0–149.0)
VLDL: 10.8 mg/dL (ref 0.0–40.0)

## 2017-05-12 LAB — HEPATIC FUNCTION PANEL
ALT: 16 U/L (ref 0–53)
AST: 17 U/L (ref 0–37)
Albumin: 4.1 g/dL (ref 3.5–5.2)
Alkaline Phosphatase: 62 U/L (ref 39–117)
Bilirubin, Direct: 0.1 mg/dL (ref 0.0–0.3)
Total Bilirubin: 0.6 mg/dL (ref 0.2–1.2)
Total Protein: 6.8 g/dL (ref 6.0–8.3)

## 2017-05-13 LAB — TSH: TSH: 4.53 u[IU]/mL — ABNORMAL HIGH (ref 0.35–4.50)

## 2017-05-14 ENCOUNTER — Other Ambulatory Visit: Payer: Self-pay | Admitting: Internal Medicine

## 2017-05-14 DIAGNOSIS — R7989 Other specified abnormal findings of blood chemistry: Secondary | ICD-10-CM

## 2017-05-14 NOTE — Progress Notes (Signed)
Order placed for f/u tsh.  

## 2017-05-29 ENCOUNTER — Other Ambulatory Visit: Payer: Self-pay | Admitting: Internal Medicine

## 2017-06-23 ENCOUNTER — Ambulatory Visit: Payer: Medicare HMO | Admitting: Oncology

## 2017-06-23 ENCOUNTER — Other Ambulatory Visit: Payer: Medicare HMO

## 2017-06-25 NOTE — Progress Notes (Signed)
[place]  [phone] [phone]  ID: Brett Riley OB: 1935/10/25  MR#: [id number]  XQJ#:[id number]  Patient Care Team: Einar Pheasant, MD as PCP - General (Internal Medicine) Einar Pheasant, MD (Internal Medicine) Bary Castilla Forest Gleason, MD (General Surgery)  CHIEF COMPLAINT: Thrombocytopenia.  INTERVAL HISTORY: Patient returns to clinic today for repeat laboratory work and further evaluation.  He continues to feel well and remains asymptomatic.  He denies any easy bleeding or bruising. He has no neurologic complaints.  He has a good appetite and denies weight loss.  He denies any recent fevers or illnesses. He denies any chest pain or shortness of breath.  He has no nausea, vomiting, constipation, or diarrhea.  He has no urinary complaints.  Patient feels at his baseline and offers no specific complaints today.  REVIEW OF SYSTEMS:   Review of Systems  Constitutional: Negative.  Negative for fever, malaise/fatigue and weight loss.  Respiratory: Negative.  Negative for cough and hemoptysis.   Cardiovascular: Negative.  Negative for chest pain and leg swelling.  Gastrointestinal: Negative.  Negative for abdominal pain, blood in stool and melena.  Genitourinary: Negative.  Negative for dysuria.  Musculoskeletal: Negative.  Negative for back pain.  Skin: Negative.  Negative for rash.  Neurological: Negative.  Negative for sensory change, focal weakness and weakness.  Endo/Heme/Allergies: Does not bruise/bleed easily.  Psychiatric/Behavioral: Negative.  The patient is not nervous/anxious.     As per HPI. Otherwise, a complete review of systems is negative.  PAST MEDICAL HISTORY: Past Medical History:  Diagnosis Date  . Coronary artery disease   . Hypercholesterolemia   . Hypertension     PAST SURGICAL HISTORY: Past Surgical History:  Procedure Laterality Date  . CARDIAC CATHETERIZATION N/A 01/19/2016   Procedure: Left Heart Cath and  Coronary Angiography;  Surgeon: Wellington Hampshire, MD;  Location: [place] [facility];  Service: Cardiovascular;  Laterality: N/A;  . ENDARTERECTOMY Left 02/15/2016   Procedure: ENDARTERECTOMY CAROTID;  Surgeon: Algernon Huxley, MD;  Location: [facility];  Service: Vascular;  Laterality: Left;  . NO PAST SURGERIES      FAMILY HISTORY: Family History  Problem Relation Age of Onset  . Heart disease Father        MI  . Heart attack Father   . Stroke Mother   . Breast cancer Sister   . Colon cancer Neg Hx   . Prostate cancer Neg Hx     ADVANCED DIRECTIVES (Y/N):  N  HEALTH MAINTENANCE: Social History   Tobacco Use  . Smoking status: Never Smoker  . Smokeless tobacco: Never Used  Substance Use Topics  . Alcohol use: No    Alcohol/week: 0.0 oz  . Drug use: No     Colonoscopy:  PAP:  Bone density:  Lipid panel:  No Known Allergies  Current Outpatient Medications  Medication Sig Dispense Refill  . aspirin 81 MG tablet Take 81 mg by mouth at bedtime.     . clopidogrel (PLAVIX) 75 MG tablet Take 1 tablet (75 mg total) by mouth daily with breakfast. 90 tablet 1  . lisinopril (PRINIVIL,ZESTRIL) 10 MG tablet TAKE 1 TABLET BY MOUTH EVERY DAY 30 tablet 3  . metoprolol succinate (TOPROL-XL) 100 MG 24 hr tablet TAKE 1 TABLET EVERY DAY WITH A MEAL 90 tablet 3  . Multiple Vitamins-Iron (MULTIVITAMINS WITH IRON) TABS Take 1 tablet by mouth daily.    . rosuvastatin (CRESTOR) 10 MG tablet Take 1 tablet (10 mg total) by mouth  daily. 90 tablet 0  . vitamin B-12 (CYANOCOBALAMIN) 1000 MCG tablet Take 1,000 mcg by mouth daily.    . nitroGLYCERIN (NITROSTAT) 0.4 MG SL tablet Place 1 tablet (0.4 mg total) under the tongue every 5 (five) minutes as needed. (Patient not taking: Reported on 06/26/2017) 25 tablet 6   No current facility-administered medications for this visit.     OBJECTIVE: Vitals:   06/26/17 1105  BP: (!) 153/87  Pulse: 73  Resp: 18  Temp: 97.9 F (36.6 C)     Body mass index  is 26.74 kg/m.    ECOG FS:0 - Asymptomatic  General: Well-developed, well-nourished, no acute distress. Eyes: Pink conjunctiva, anicteric sclera. HEENT: Normocephalic, moist mucous membranes, clear oropharnyx. Lungs: Clear to auscultation bilaterally. Heart: Regular rate and rhythm. No rubs, murmurs, or gallops. Abdomen: Soft, nontender, nondistended. No organomegaly noted, normoactive bowel sounds. Musculoskeletal: No edema, cyanosis, or clubbing. Neuro: Alert, answering all questions appropriately. Cranial nerves grossly intact. Skin: No rashes or petechiae noted. Psych: Normal affect. Lymphatics: No cervical, calvicular, axillary or inguinal LAD.  LAB RESULTS:  Lab Results  Component Value Date   NA 140 05/12/2017   K 4.6 05/12/2017   CL 106 05/12/2017   CO2 30 05/12/2017   GLUCOSE 101 (H) 05/12/2017   BUN 20 05/12/2017   CREATININE 0.93 05/12/2017   CALCIUM 9.6 05/12/2017   PROT 6.8 05/12/2017   ALBUMIN 4.1 05/12/2017   AST 17 05/12/2017   ALT 16 05/12/2017   ALKPHOS 62 05/12/2017   BILITOT 0.6 05/12/2017   GFRNONAA >60 02/16/2016   GFRAA >60 02/16/2016    Lab Results  Component Value Date   WBC 5.1 06/26/2017   NEUTROABS 2.5 06/26/2017   HGB 13.1 06/26/2017   HCT 37.6 (L) 06/26/2017   MCV 96.3 06/26/2017   PLT 123 (L) 06/26/2017     STUDIES: No results found.  ASSESSMENT: Thrombocytopenia  PLAN:    1. Thrombocytopenia: Patient's platelet count remains decreased, but stable.  Previously, the remainder of his blood work including platelet antibodies, SPEP, and iron stores really negative or within normal limits.  No intervention is needed at this time.  Patient does not require bone marrow biopsy.  He has been instructed to continue his aspirin and Plavix as prescribed.  After lengthy discussion with the patient, it was agreed upon that no further follow-up is necessary.  Please monitor his CBC 2-3 times per year and if his platelet count declines and falls  below 100 please send him back for further evaluation.  Approximately 20 minutes was spent in discussion of which greater than 50% was consultation.    Patient expressed understanding and was in agreement with this plan. He also understands that He can call clinic at any time with any questions, concerns, or complaints.    Lloyd Huger, MD   06/26/2017 11:29 AM

## 2017-06-26 ENCOUNTER — Inpatient Hospital Stay (HOSPITAL_BASED_OUTPATIENT_CLINIC_OR_DEPARTMENT_OTHER): Payer: Medicare HMO | Admitting: Oncology

## 2017-06-26 ENCOUNTER — Other Ambulatory Visit: Payer: Self-pay

## 2017-06-26 ENCOUNTER — Inpatient Hospital Stay: Payer: Medicare HMO | Attending: Oncology

## 2017-06-26 ENCOUNTER — Ambulatory Visit (INDEPENDENT_AMBULATORY_CARE_PROVIDER_SITE_OTHER): Payer: Medicare HMO | Admitting: Internal Medicine

## 2017-06-26 VITALS — BP 153/87 | HR 73 | Temp 97.9°F | Resp 18 | Wt 191.7 lb

## 2017-06-26 DIAGNOSIS — D649 Anemia, unspecified: Secondary | ICD-10-CM

## 2017-06-26 DIAGNOSIS — I6523 Occlusion and stenosis of bilateral carotid arteries: Secondary | ICD-10-CM | POA: Diagnosis not present

## 2017-06-26 DIAGNOSIS — D696 Thrombocytopenia, unspecified: Secondary | ICD-10-CM | POA: Diagnosis present

## 2017-06-26 DIAGNOSIS — R7989 Other specified abnormal findings of blood chemistry: Secondary | ICD-10-CM

## 2017-06-26 DIAGNOSIS — I10 Essential (primary) hypertension: Secondary | ICD-10-CM

## 2017-06-26 DIAGNOSIS — I2510 Atherosclerotic heart disease of native coronary artery without angina pectoris: Secondary | ICD-10-CM

## 2017-06-26 DIAGNOSIS — E7800 Pure hypercholesterolemia, unspecified: Secondary | ICD-10-CM

## 2017-06-26 LAB — CBC WITH DIFFERENTIAL/PLATELET
Basophils Absolute: 0 10*3/uL (ref 0–0.1)
Basophils Relative: 1 %
Eosinophils Absolute: 0.2 10*3/uL (ref 0–0.7)
Eosinophils Relative: 5 %
HCT: 37.6 % — ABNORMAL LOW (ref 40.0–52.0)
Hemoglobin: 13.1 g/dL (ref 13.0–18.0)
Lymphocytes Relative: 38 %
Lymphs Abs: 1.9 10*3/uL (ref 1.0–3.6)
MCH: 33.5 pg (ref 26.0–34.0)
MCHC: 34.8 g/dL (ref 32.0–36.0)
MCV: 96.3 fL (ref 80.0–100.0)
Monocytes Absolute: 0.4 10*3/uL (ref 0.2–1.0)
Monocytes Relative: 7 %
Neutro Abs: 2.5 10*3/uL (ref 1.4–6.5)
Neutrophils Relative %: 49 %
Platelets: 123 10*3/uL — ABNORMAL LOW (ref 150–440)
RBC: 3.91 MIL/uL — ABNORMAL LOW (ref 4.40–5.90)
RDW: 13.2 % (ref 11.5–14.5)
WBC: 5.1 10*3/uL (ref 3.8–10.6)

## 2017-06-26 LAB — TSH: TSH: 2.7 u[IU]/mL (ref 0.35–4.50)

## 2017-06-26 NOTE — Progress Notes (Signed)
Pt here for follow up. Stated he just saw Dr Michael Litter " no idea why I am here" doing well he stated.

## 2017-06-26 NOTE — Progress Notes (Signed)
Patient ID: Brett Riley, male   DOB: 1935-09-20, 82 y.o.   MRN: [id number]   Subjective:    Patient ID: Brett Riley, male    DOB: June 14, 1935, 82 y.o.   MRN: [id number]  HPI  Patient here for a scheduled follow up.  States he is doing well.  Feels good.  Stays active.  No chest pain.  No sob.  No acid reflux. No abdominal pain.  Bowels moving.  Due to f/u with Dr Grayland Ormond this week.  Discussed labs.  Will recheck tsh today.     Past Medical History:  Diagnosis Date  . Coronary artery disease   . Hypercholesterolemia   . Hypertension    Past Surgical History:  Procedure Laterality Date  . CARDIAC CATHETERIZATION N/A 01/19/2016   Procedure: Left Heart Cath and Coronary Angiography;  Surgeon: Wellington Hampshire, MD;  Location: [place] [facility];  Service: Cardiovascular;  Laterality: N/A;  . ENDARTERECTOMY Left 02/15/2016   Procedure: ENDARTERECTOMY CAROTID;  Surgeon: Algernon Huxley, MD;  Location: [facility];  Service: Vascular;  Laterality: Left;  . NO PAST SURGERIES     Family History  Problem Relation Age of Onset  . Heart disease Father        MI  . Heart attack Father   . Stroke Mother   . Breast cancer Sister   . Colon cancer Neg Hx   . Prostate cancer Neg Hx    Social History   Socioeconomic History  . Marital status: Married    Spouse name: Not on file  . Number of children: 1  . Years of education: Not on file  . Highest education level: Not on file  Occupational History    Employer: [employer]  Social Needs  . Financial resource strain: Not hard at all  . Food insecurity:    Worry: Never true    Inability: Never true  . Transportation needs:    Medical: No    Non-medical: No  Tobacco Use  . Smoking status: Never Smoker  . Smokeless tobacco: Never Used  Substance and Sexual Activity  . Alcohol use: No    Alcohol/week: 0.0 oz  . Drug use: No  . Sexual activity: Not Currently  Lifestyle  . Physical activity:    Days per week: Not on file   Minutes per session: Not on file  . Stress: Not on file  Relationships  . Social connections:    Talks on phone: Not on file    Gets together: Not on file    Attends religious service: Not on file    Active member of club or organization: Not on file    Attends meetings of clubs or organizations: Not on file    Relationship status: Not on file  Other Topics Concern  . Not on file  Social History Narrative  . Not on file    Outpatient Encounter Medications as of 06/26/2017  Medication Sig  . aspirin 81 MG tablet Take 81 mg by mouth at bedtime.   . clopidogrel (PLAVIX) 75 MG tablet Take 1 tablet (75 mg total) by mouth daily with breakfast.  . lisinopril (PRINIVIL,ZESTRIL) 10 MG tablet TAKE 1 TABLET BY MOUTH EVERY DAY  . metoprolol succinate (TOPROL-XL) 100 MG 24 hr tablet TAKE 1 TABLET EVERY DAY WITH A MEAL  . Multiple Vitamins-Iron (MULTIVITAMINS WITH IRON) TABS Take 1 tablet by mouth daily.  . nitroGLYCERIN (NITROSTAT) 0.4 MG SL tablet Place 1 tablet (0.4 mg total) under  the tongue every 5 (five) minutes as needed. (Patient not taking: Reported on 06/26/2017)  . rosuvastatin (CRESTOR) 10 MG tablet Take 1 tablet (10 mg total) by mouth daily.  . vitamin B-12 (CYANOCOBALAMIN) 1000 MCG tablet Take 1,000 mcg by mouth daily.   No facility-administered encounter medications on file as of 06/26/2017.     Review of Systems  Constitutional: Negative for appetite change and unexpected weight change.  HENT: Negative for congestion and sinus pressure.   Respiratory: Negative for cough, chest tightness and shortness of breath.   Cardiovascular: Negative for chest pain, palpitations and leg swelling.  Gastrointestinal: Negative for abdominal pain, diarrhea and nausea.  Genitourinary: Negative for difficulty urinating and dysuria.  Musculoskeletal: Negative for joint swelling and myalgias.  Skin: Negative for color change and rash.  Neurological: Negative for dizziness, light-headedness and  headaches.  Psychiatric/Behavioral: Negative for agitation and dysphoric mood.       Objective:    Physical Exam  Constitutional: He appears well-developed and well-nourished. No distress.  HENT:  Nose: Nose normal.  Mouth/Throat: Oropharynx is clear and moist.  Neck: Neck supple. No thyromegaly present.  Cardiovascular: Normal rate and regular rhythm.  Pulmonary/Chest: Effort normal and breath sounds normal. No respiratory distress.  Abdominal: Soft. Bowel sounds are normal. There is no tenderness.  Musculoskeletal: He exhibits no edema or tenderness.  Lymphadenopathy:    He has no cervical adenopathy.  Skin: No rash noted. No erythema.  Psychiatric: He has a normal mood and affect. His behavior is normal.    BP 136/80 (BP Location: Left Arm, Patient Position: Sitting, Cuff Size: Normal)   Pulse 62   Temp 98.4 F (36.9 C) (Oral)   Resp 16   Wt 192 lb 9.6 oz (87.4 kg)   SpO2 97%   BMI 26.86 kg/m  Wt Readings from Last 3 Encounters:  06/26/17 191 lb 11.2 oz (87 kg)  06/26/17 192 lb 9.6 oz (87.4 kg)  05/06/17 191 lb 12.8 oz (87 kg)     Lab Results  Component Value Date   WBC 5.1 06/26/2017   HGB 13.1 06/26/2017   HCT 37.6 (L) 06/26/2017   PLT 123 (L) 06/26/2017   GLUCOSE 101 (H) 05/12/2017   CHOL 92 05/12/2017   TRIG 54.0 05/12/2017   HDL 40.30 05/12/2017   LDLCALC 41 05/12/2017   ALT 16 05/12/2017   AST 17 05/12/2017   NA 140 05/12/2017   K 4.6 05/12/2017   CL 106 05/12/2017   CREATININE 0.93 05/12/2017   BUN 20 05/12/2017   CO2 30 05/12/2017   TSH 2.70 06/26/2017   PSA 0.63 09/24/2016   INR 1.08 02/13/2016       Assessment & Plan:   Problem List Items Addressed This Visit    Anemia    Follow cbc.       CAD (coronary artery disease)    Previous heart catheterization revealed 2 vessel CAD.  Has been followed by cardiology.  On crestor.  Continue risk factor modification.        Carotid stenosis    S/p CEA.  Followed by [facility].  Continue  aggressive risk factor modification.  Continues on aspirin and plavix.        Essential hypertension, benign    Blood pressure as outlined.  Overall under reasonable control.  Follow pressures.  Follow metabolic panel.        Hypercholesterolemia    On crestor.  Low cholesterol diet and exercise.  Follow lipid panel and liver function  tests.        Thrombocytopenia ([place])    Due to f/u with hematology this week.         Other Visit Diagnoses    Elevated TSH           Einar Pheasant, MD

## 2017-06-28 ENCOUNTER — Encounter: Payer: Self-pay | Admitting: Internal Medicine

## 2017-06-28 NOTE — Assessment & Plan Note (Signed)
S/p CEA.  Followed by [facility].  Continue aggressive risk factor modification.  Continues on aspirin and plavix.

## 2017-06-28 NOTE — Assessment & Plan Note (Signed)
Previous heart catheterization revealed 2 vessel CAD.  Has been followed by cardiology.  On crestor.  Continue risk factor modification.

## 2017-06-28 NOTE — Assessment & Plan Note (Signed)
On crestor.  Low cholesterol diet and exercise.  Follow lipid panel and liver function tests.   

## 2017-06-28 NOTE — Assessment & Plan Note (Signed)
Due to f/u with hematology this week.

## 2017-06-28 NOTE — Assessment & Plan Note (Signed)
Follow cbc.  

## 2017-06-28 NOTE — Assessment & Plan Note (Signed)
Blood pressure as outlined.  Overall under reasonable control.  Follow pressures.  Follow metabolic panel.

## 2017-07-09 DIAGNOSIS — R69 Illness, unspecified: Secondary | ICD-10-CM | POA: Diagnosis not present

## 2017-07-24 ENCOUNTER — Telehealth: Payer: Self-pay | Admitting: *Deleted

## 2017-07-24 DIAGNOSIS — L989 Disorder of the skin and subcutaneous tissue, unspecified: Secondary | ICD-10-CM

## 2017-07-24 NOTE — Telephone Encounter (Signed)
Copied from [place] #[id number]. Topic: Referral - Request >> Jul 24, 2017  9:16 AM Carolyn Stare wrote:  Pt said he has spots on the top of his head and is asking to see a dermatologist but not Dr Phillip Heal

## 2017-07-24 NOTE — Telephone Encounter (Signed)
Patient was just seen in the office. Can we place referral to dermatology?

## 2017-07-25 NOTE — Telephone Encounter (Signed)
Order placed for dermatology referral.  See note. Pt request not to see Dr Phillip Heal.

## 2017-08-13 DIAGNOSIS — D485 Neoplasm of uncertain behavior of skin: Secondary | ICD-10-CM | POA: Diagnosis not present

## 2017-08-13 DIAGNOSIS — L570 Actinic keratosis: Secondary | ICD-10-CM | POA: Diagnosis not present

## 2017-08-13 DIAGNOSIS — C44329 Squamous cell carcinoma of skin of other parts of face: Secondary | ICD-10-CM | POA: Diagnosis not present

## 2017-08-13 DIAGNOSIS — C4442 Squamous cell carcinoma of skin of scalp and neck: Secondary | ICD-10-CM | POA: Diagnosis not present

## 2017-08-13 DIAGNOSIS — D044 Carcinoma in situ of skin of scalp and neck: Secondary | ICD-10-CM | POA: Diagnosis not present

## 2017-08-28 DIAGNOSIS — C44329 Squamous cell carcinoma of skin of other parts of face: Secondary | ICD-10-CM | POA: Diagnosis not present

## 2017-09-15 ENCOUNTER — Other Ambulatory Visit: Payer: Self-pay | Admitting: Internal Medicine

## 2017-09-18 ENCOUNTER — Other Ambulatory Visit: Payer: Self-pay | Admitting: Internal Medicine

## 2017-09-19 ENCOUNTER — Telehealth: Payer: Self-pay

## 2017-09-19 ENCOUNTER — Other Ambulatory Visit: Payer: Self-pay | Admitting: Cardiovascular Disease

## 2017-09-19 NOTE — Telephone Encounter (Signed)
Pt requests we call back to schedule past due 6 month f/u appt, due to his wife not being available to schedule.

## 2017-09-19 NOTE — Telephone Encounter (Signed)
-----   Message from Anselm Pancoast, [place] sent at 09/19/2017 10:45 AM EDT ----- Please contact patient for a follow up.  Thanks, Ivin Booty

## 2017-09-22 NOTE — Telephone Encounter (Signed)
Pt scheduled 11/06/17

## 2017-09-22 NOTE — Telephone Encounter (Signed)
Lmov for patient to call back and schedule appointment ° ° °

## 2017-09-25 DIAGNOSIS — C4442 Squamous cell carcinoma of skin of scalp and neck: Secondary | ICD-10-CM | POA: Diagnosis not present

## 2017-09-26 ENCOUNTER — Other Ambulatory Visit: Payer: Self-pay

## 2017-09-26 ENCOUNTER — Other Ambulatory Visit: Payer: Self-pay | Admitting: Cardiovascular Disease

## 2017-09-26 MED ORDER — ROSUVASTATIN CALCIUM 10 MG PO TABS: 10.0000 mg | ORAL_TABLET | Freq: Every day | ORAL | 0 refills | Status: DC

## 2017-10-09 DIAGNOSIS — D0439 Carcinoma in situ of skin of other parts of face: Secondary | ICD-10-CM | POA: Diagnosis not present

## 2017-10-09 DIAGNOSIS — L905 Scar conditions and fibrosis of skin: Secondary | ICD-10-CM | POA: Diagnosis not present

## 2017-10-30 ENCOUNTER — Other Ambulatory Visit: Payer: Self-pay | Admitting: Internal Medicine

## 2017-11-06 ENCOUNTER — Ambulatory Visit: Payer: Medicare HMO | Admitting: Nurse Practitioner

## 2017-11-06 DIAGNOSIS — D044 Carcinoma in situ of skin of scalp and neck: Secondary | ICD-10-CM | POA: Diagnosis not present

## 2017-11-06 DIAGNOSIS — D0439 Carcinoma in situ of skin of other parts of face: Secondary | ICD-10-CM | POA: Diagnosis not present

## 2017-12-11 ENCOUNTER — Ambulatory Visit: Payer: Medicare HMO | Admitting: Nurse Practitioner

## 2017-12-11 ENCOUNTER — Encounter: Payer: Self-pay | Admitting: Nurse Practitioner

## 2017-12-11 VITALS — BP 134/70 | HR 60 | Ht 72.0 in | Wt 193.5 lb

## 2017-12-11 DIAGNOSIS — I2510 Atherosclerotic heart disease of native coronary artery without angina pectoris: Secondary | ICD-10-CM | POA: Diagnosis not present

## 2017-12-11 DIAGNOSIS — E782 Mixed hyperlipidemia: Secondary | ICD-10-CM

## 2017-12-11 DIAGNOSIS — I6522 Occlusion and stenosis of left carotid artery: Secondary | ICD-10-CM | POA: Diagnosis not present

## 2017-12-11 DIAGNOSIS — I10 Essential (primary) hypertension: Secondary | ICD-10-CM

## 2017-12-11 NOTE — Progress Notes (Signed)
Office Visit    Patient Name: Brett Riley Date of Encounter: 12/11/2017  Primary Care Provider:  Einar Pheasant, MD Primary Cardiologist:  Formerly A. Yvone Neu, MD   Chief Complaint    82 year old male with a history of coronary artery disease, hypertension, hyperlipidemia and carotid arterial disease status post left carotid endarterectomy, who presents for follow-up related to CAD.  Past Medical History    Past Medical History:  Diagnosis Date  . Carotid arterial disease ([place])    a. 02/2016 L CEA; b. 78/6767 U/S: patent LICA, 2-09% RICA.  Marland Kitchen Coronary artery disease    a. 01/2016 MV: mild apical/basal inferior, apical lateral, mid anterolateral, and mid inferolateral ischemia. EF 57%; b. 02/2016 Cath: LM 40ost, LAD 70p/m, 20d, D1 95 (small), LCX nl, RCA 59m, RPDA 90 (small), EF 55-65%-->med Rx. Rec CABG for recurrent symptoms.  . History of echocardiogram    a. 02/2016 Echo: EF 50-55%, no rwma, mild MR.  Marland Kitchen Hypercholesterolemia   . Hypertension    Past Surgical History:  Procedure Laterality Date  . CARDIAC CATHETERIZATION N/A 01/19/2016   Procedure: Left Heart Cath and Coronary Angiography;  Surgeon: Wellington Hampshire, MD;  Location: [place] [facility];  Service: Cardiovascular;  Laterality: N/A;  . ENDARTERECTOMY Left 02/15/2016   Procedure: ENDARTERECTOMY CAROTID;  Surgeon: Algernon Huxley, MD;  Location: [facility];  Service: Vascular;  Laterality: Left;  . NO PAST SURGERIES      Allergies  No Known Allergies  History of Present Illness    82 year old male with the above past medical history including hypertension, hyperlipidemia, CAD, and carotid arterial disease.  He was initially evaluated by Dr. Yvone Neu in November 2017 in the setting of preoperative evaluation for left CEA.  At the time, he reported some dyspnea on exertion and he underwent stress testing which was abnormal as outlined above in the past medical history.  This was followed by diagnostic catheterization  in December 2017 showing moderate, 40% ostial left main stenosis as well as 70% proximal and mid LAD disease.  He had significant diagonal and RPDA disease however both of these vessels were small and not felt to be amenable to PCI.  As a result, medical therapy was recommended.  He subsequently underwent left CEA which was successful and has not had any complications.  Follow-up ultrasound in November 2018 showed stable/patent left CEA site with minimal right ICA disease.  He was last seen here in the spring 2018 and since then, he has continued to work full-time.  He works in [employer] and is on his feet most of the day.  He is fairly active and does not experience chest pain or dyspnea.  Further, he denies PND, orthopnea, dizziness, syncope, edema, or early satiety.  Home Medications    Prior to Admission medications   Medication Sig Start Date End Date Taking? Authorizing Provider  aspirin 81 MG tablet Take 81 mg by mouth at bedtime.     [provider]  clopidogrel (PLAVIX) 75 MG tablet TAKE 1 TABLET (75 MG TOTAL) BY MOUTH DAILY WITH BREAKFAST. 09/15/17   Einar Pheasant, MD  lisinopril (PRINIVIL,ZESTRIL) 10 MG tablet TAKE 1 TABLET BY MOUTH EVERY DAY 09/18/17   Einar Pheasant, MD  metoprolol succinate (TOPROL-XL) 100 MG 24 hr tablet TAKE 1 TABLET BY MOUTH EVERY DAY WITH A MEAL 10/30/17   Einar Pheasant, MD  Multiple Vitamins-Iron (MULTIVITAMINS WITH IRON) TABS Take 1 tablet by mouth daily.    [provider]  nitroGLYCERIN (NITROSTAT) 0.4 MG SL tablet Place 1 tablet (0.4 mg total) under the tongue every 5 (five) minutes as needed. Patient not taking: Reported on 06/26/2017 01/23/16   Wende Bushy, MD  rosuvastatin (CRESTOR) 10 MG tablet Take 1 tablet (10 mg total) by mouth daily. 09/26/17   Theora Gianotti, NP  vitamin B-12 (CYANOCOBALAMIN) 1000 MCG tablet Take 1,000 mcg by mouth daily.    [provider]    Review of Systems    He denies chest pain,  palpitations, dyspnea, pnd, orthopnea, n, v, dizziness, syncope, edema, weight gain, or early satiety.  All other systems reviewed and are otherwise negative except as noted above.  Physical Exam    VS:  BP 134/70 (BP Location: Left Arm, Patient Position: Sitting, Cuff Size: Normal)   Pulse 60   Ht 6' (1.829 m)   Wt 193 lb 8 oz (87.8 kg)   BMI 26.24 kg/m  , BMI Body mass index is 26.24 kg/m. GEN: Well nourished, well developed, in no acute distress. HEENT: normal. Neck: Supple, no JVD, carotid bruits, or masses. Cardiac: RRR, no murmurs, rubs, or gallops. No clubbing, cyanosis, edema.  Radials/DP/PT 2+ and equal bilaterally.  Respiratory:  Respirations regular and unlabored, clear to auscultation bilaterally. GI: Soft, nontender, nondistended, BS + x 4. MS: no deformity or atrophy. Skin: warm and dry, no rash. Neuro:  Strength and sensation are intact. Psych: Normal affect.  Accessory Clinical Findings    ECG personally reviewed by me today -sinus arrhythmia, 60, no acute ST or T changes- no acute changes.  Assessment & Plan    1.  Coronary artery disease: Status post abnormal Myoview in November 2017 followed by diagnostic catheterization revealing moderate left main and LAD disease along with significant branch vessel disease including a small diagonal and small RPDA.  He has been medically managed ever since with the sense that if he developed chest discomfort at any point, he would need to be considered for CABG.  He has done well without any symptoms or limitations.  He remains on aspirin, Plavix, beta-blocker, ACE inhibitor, and statin therapy.  2.  Essential hypertension: Blood pressure was initially mildly elevated at 146/74 however on repeat, he was 134/70.  He says he typically runs about 130 at home.  I will not change any of his medical having and that he monitor his salt intake more closely.  He does go out to eat a fair amount.  3.  Hyperlipidemia: LDL was 41 in March of  this year with normal LFTs.  Continue statin therapy.  4.  Carotid arterial disease: Status post left carotid endarterectomy in 2017 stable left CEA site by ultrasound last November.  He is due for repeat ultrasound through vascular surgical office next month.  5.  Disposition: Follow-up in 1 year or sooner if necessary.  As he had previously seen Dr. Fletcher Anon for catheterization, I will reassign him to Dr. Fletcher Anon in Dr. Tora Kindred absence.  Murray Hodgkins, NP 12/11/2017, 9:25 AM

## 2017-12-11 NOTE — Patient Instructions (Signed)
Medication Instructions: Your physician recommends that you continue on your current medications as directed. Please refer to the Current Medication list given to you today.   Labwork: None ordered    Testing/Procedures: None ordered    Follow-Up: Your physician wants you to follow-up in: one year with Dr. Fletcher Anon. You will receive a reminder letter in the mail two months in advance. If you don't receive a letter, please call our office to schedule the follow-up appointment.     Any Other Special Instructions Will Be Listed Below (If Applicable).     If you need a refill on your cardiac medications before your next appointment, please call your pharmacy.

## 2018-01-13 ENCOUNTER — Encounter (INDEPENDENT_AMBULATORY_CARE_PROVIDER_SITE_OTHER): Payer: Self-pay | Admitting: Vascular Surgery

## 2018-01-13 ENCOUNTER — Ambulatory Visit (INDEPENDENT_AMBULATORY_CARE_PROVIDER_SITE_OTHER): Payer: Medicare HMO

## 2018-01-13 ENCOUNTER — Ambulatory Visit (INDEPENDENT_AMBULATORY_CARE_PROVIDER_SITE_OTHER): Payer: Medicare HMO | Admitting: Vascular Surgery

## 2018-01-13 VITALS — BP 141/71 | HR 67 | Resp 16 | Ht 71.0 in | Wt 195.0 lb

## 2018-01-13 DIAGNOSIS — I6523 Occlusion and stenosis of bilateral carotid arteries: Secondary | ICD-10-CM | POA: Diagnosis not present

## 2018-01-13 DIAGNOSIS — E7800 Pure hypercholesterolemia, unspecified: Secondary | ICD-10-CM | POA: Diagnosis not present

## 2018-01-13 DIAGNOSIS — I10 Essential (primary) hypertension: Secondary | ICD-10-CM

## 2018-01-13 NOTE — Progress Notes (Signed)
Subjective:    Patient ID: Brett Riley, male    DOB: May 29, 1935, 82 y.o.   MRN: [id number] Chief Complaint  Patient presents with  . Follow-up    ultrasound follow up   Patient presents for a yearly non-invasive study follow up for carotid stenosis. The stenosis has been followed by surveillance duplexes. The patient underwent a bilateral carotid duplex scan which showed no change from the previous exam on 01-10-17. This is s/p a left carotid endarterectomy on February 15, 2016.  Duplex is stable at Right ICA stenosis (1-39%) and Left ICA stenosis (<50%, stable intimal thickening).  Bilateral vertebral arteries are antegrade.  Bilateral subclavian arteries are noted: Right - turbulent / Left -normal.  Patient denies any right upper extremity pain or ulceration. The patient denies experiencing Amaurosis Fugax, TIA like symptoms or focal motor deficits.  The patient denies any issues with his incision site.  The patient denies any fever, nausea vomiting.  Review of Systems  Constitutional: Negative.   HENT: Negative.   Eyes: Negative.   Respiratory: Negative.   Cardiovascular: Negative.   Gastrointestinal: Negative.   Endocrine: Negative.   Genitourinary: Negative.   Musculoskeletal: Negative.   Skin: Negative.   Allergic/Immunologic: Negative.   Neurological: Negative.   Hematological: Negative.   Psychiatric/Behavioral: Negative.       Objective:   Physical Exam  Constitutional: He is oriented to person, place, and time. He appears well-developed and well-nourished. No distress.  HENT:  Head: Normocephalic and atraumatic.  Right Ear: External ear normal.  Left Ear: External ear normal.  Eyes: Pupils are equal, round, and reactive to light. Conjunctivae and EOM are normal.  Neck: Normal range of motion.  Mild right carotid bruit noted on exam. No left carotid bruit noted.  Cardiovascular: Normal rate, regular rhythm, normal heart sounds and intact distal pulses.  Pulses:      Radial pulses are 2+ on the right side, and 2+ on the left side.  Pulmonary/Chest: Effort normal and breath sounds normal.  Musculoskeletal: Normal range of motion. He exhibits no edema.  Neurological: He is alert and oriented to person, place, and time.  Skin: Skin is warm and dry. He is not diaphoretic.  Psychiatric: He has a normal mood and affect. His behavior is normal. Judgment and thought content normal.  Vitals reviewed.  BP (!) 141/71 (BP Location: Left Arm)   Pulse 67   Resp 16   Ht 5\' 11"  (1.803 m)   Wt 195 lb (88.5 kg)   BMI 27.20 kg/m   Past Medical History:  Diagnosis Date  . Carotid arterial disease ([place])    a. 02/2016 L CEA; b. 05/5007 U/S: patent LICA, 3-81% RICA.  Marland Kitchen Coronary artery disease    a. 01/2016 MV: mild apical/basal inferior, apical lateral, mid anterolateral, and mid inferolateral ischemia. EF 57%; b. 02/2016 Cath: LM 40ost, LAD 70p/m, 20d, D1 95 (small), LCX nl, RCA 90m, RPDA 90 (small), EF 55-65%-->med Rx. Rec CABG for recurrent symptoms.  . History of echocardiogram    a. 02/2016 Echo: EF 50-55%, no rwma, mild MR.  Marland Kitchen Hypercholesterolemia   . Hypertension    Social History   Socioeconomic History  . Marital status: Married    Spouse name: Not on file  . Number of children: 1  . Years of education: Not on file  . Highest education level: Not on file  Occupational History    Employer: [employer]  Social Needs  . Financial resource strain: Not hard  at all  . Food insecurity:    Worry: Never true    Inability: Never true  . Transportation needs:    Medical: No    Non-medical: No  Tobacco Use  . Smoking status: Never Smoker  . Smokeless tobacco: Never Used  Substance and Sexual Activity  . Alcohol use: No    Alcohol/week: 0.0 standard drinks  . Drug use: No  . Sexual activity: Not Currently  Lifestyle  . Physical activity:    Days per week: Not on file    Minutes per session: Not on file  . Stress: Not on file  Relationships  .  Social connections:    Talks on phone: Not on file    Gets together: Not on file    Attends religious service: Not on file    Active member of club or organization: Not on file    Attends meetings of clubs or organizations: Not on file    Relationship status: Not on file  . Intimate partner violence:    Fear of current or ex partner: No    Emotionally abused: No    Physically abused: No    Forced sexual activity: No  Other Topics Concern  . Not on file  Social History Narrative  . Not on file   Past Surgical History:  Procedure Laterality Date  . CARDIAC CATHETERIZATION N/A 01/19/2016   Procedure: Left Heart Cath and Coronary Angiography;  Surgeon: Wellington Hampshire, MD;  Location: [place] [facility];  Service: Cardiovascular;  Laterality: N/A;  . ENDARTERECTOMY Left 02/15/2016   Procedure: ENDARTERECTOMY CAROTID;  Surgeon: Algernon Huxley, MD;  Location: [facility];  Service: Vascular;  Laterality: Left;  . NO PAST SURGERIES     Family History  Problem Relation Age of Onset  . Heart disease Father        MI  . Heart attack Father   . Stroke Mother   . Breast cancer Sister   . Colon cancer Neg Hx   . Prostate cancer Neg Hx    No Known Allergies     Assessment & Plan:  Patient presents for a yearly non-invasive study follow up for carotid stenosis. The stenosis has been followed by surveillance duplexes. The patient underwent a bilateral carotid duplex scan which showed no change from the previous exam on 01-10-17. This is s/p a left carotid endarterectomy on February 15, 2016.  Duplex is stable at Right ICA stenosis (1-39%) and Left ICA stenosis (<50%, stable intimal thickening).  Bilateral vertebral arteries are antegrade.  Bilateral subclavian arteries are noted: Right - turbulent / Left -normal.  Patient denies any right upper extremity pain or ulceration. The patient denies experiencing Amaurosis Fugax, TIA like symptoms or focal motor deficits.  The patient denies any issues  with his incision site.  The patient denies any fever, nausea vomiting.  1. Bilateral carotid artery stenosis - Stable Studies reviewed with patient. Patient asymptomatic with stable duplex.  Physical exam is stable and unremarkable. No intervention at this time. Patient to return in one year for surveillance carotid duplex. Patient to continue medical optimization with ASA, Plavix and dyslipidemia medication. Patient to remain abstinent of tobacco use. I have discussed with the patient at length the risk factors for and pathogenesis of atherosclerotic disease and encouraged a healthy diet, regular exercise regimen and blood pressure / glucose control.  Patient was instructed to contact our office in the interim with problems such as arm / leg weakness or numbness, speech /  swallowing difficulty or temporary monocular blindness. The patient expresses their understanding.  - VAS US CAROTID; Future  2. Hypercholesterolemia - Stable On ASA and statin. Encouraged good control as its slows the progression of atherosclerotic disease.  3. Essential hypertension, benign - Stable On appropriate medications. Encouraged good control as its slows the progression of atherosclerotic disease.  Current Outpatient Medications on File Prior to Visit  Medication Sig Dispense Refill  . aspirin 81 MG tablet Take 81 mg by mouth at bedtime.     . clopidogrel (PLAVIX) 75 MG tablet TAKE 1 TABLET (75 MG TOTAL) BY MOUTH DAILY WITH BREAKFAST. 90 tablet 1  . lisinopril (PRINIVIL,ZESTRIL) 10 MG tablet TAKE 1 TABLET BY MOUTH EVERY DAY 90 tablet 1  . metoprolol succinate (TOPROL-XL) 100 MG 24 hr tablet TAKE 1 TABLET BY MOUTH EVERY DAY WITH A MEAL 90 tablet 2  . Multiple Vitamins-Iron (MULTIVITAMINS WITH IRON) TABS Take 1 tablet by mouth daily.    . nitroGLYCERIN (NITROSTAT) 0.4 MG SL tablet Place 1 tablet (0.4 mg total) under the tongue every 5 (five) minutes as needed. 25 tablet 6  . rosuvastatin (CRESTOR) 10 MG  tablet Take 1 tablet (10 mg total) by mouth daily. 90 tablet 0  . vitamin B-12 (CYANOCOBALAMIN) 1000 MCG tablet Take 1,000 mcg by mouth daily.     No current facility-administered medications on file prior to visit.    There are no Patient Instructions on file for this visit. Return in about 1 year (around 01/14/2019), or if symptoms worsen or fail to improve.  Wrangler Penning A Marypat Kimmet, PA-C

## 2018-01-26 DIAGNOSIS — R69 Illness, unspecified: Secondary | ICD-10-CM | POA: Diagnosis not present

## 2018-02-11 DIAGNOSIS — C44319 Basal cell carcinoma of skin of other parts of face: Secondary | ICD-10-CM | POA: Diagnosis not present

## 2018-02-11 DIAGNOSIS — C44612 Basal cell carcinoma of skin of right upper limb, including shoulder: Secondary | ICD-10-CM | POA: Diagnosis not present

## 2018-02-11 DIAGNOSIS — Z859 Personal history of malignant neoplasm, unspecified: Secondary | ICD-10-CM | POA: Diagnosis not present

## 2018-02-11 DIAGNOSIS — L570 Actinic keratosis: Secondary | ICD-10-CM | POA: Diagnosis not present

## 2018-02-11 DIAGNOSIS — D485 Neoplasm of uncertain behavior of skin: Secondary | ICD-10-CM | POA: Diagnosis not present

## 2018-02-11 DIAGNOSIS — Z872 Personal history of diseases of the skin and subcutaneous tissue: Secondary | ICD-10-CM | POA: Diagnosis not present

## 2018-02-11 DIAGNOSIS — L578 Other skin changes due to chronic exposure to nonionizing radiation: Secondary | ICD-10-CM | POA: Diagnosis not present

## 2018-02-11 DIAGNOSIS — Z1283 Encounter for screening for malignant neoplasm of skin: Secondary | ICD-10-CM | POA: Diagnosis not present

## 2018-03-12 ENCOUNTER — Other Ambulatory Visit: Payer: Self-pay | Admitting: Internal Medicine

## 2018-03-13 DIAGNOSIS — C44319 Basal cell carcinoma of skin of other parts of face: Secondary | ICD-10-CM | POA: Diagnosis not present

## 2018-03-26 ENCOUNTER — Other Ambulatory Visit: Payer: Self-pay | Admitting: *Deleted

## 2018-03-26 MED ORDER — ROSUVASTATIN CALCIUM 10 MG PO TABS: 10.0000 mg | ORAL_TABLET | Freq: Every day | ORAL | 2 refills | Status: DC

## 2018-03-27 DIAGNOSIS — C44612 Basal cell carcinoma of skin of right upper limb, including shoulder: Secondary | ICD-10-CM | POA: Diagnosis not present

## 2018-05-08 ENCOUNTER — Ambulatory Visit (INDEPENDENT_AMBULATORY_CARE_PROVIDER_SITE_OTHER): Payer: Medicare HMO

## 2018-05-08 VITALS — BP 130/70 | HR 80 | Temp 98.2°F | Resp 15 | Ht 71.0 in | Wt 196.8 lb

## 2018-05-08 DIAGNOSIS — Z0000 Encounter for general adult medical examination without abnormal findings: Secondary | ICD-10-CM

## 2018-05-08 DIAGNOSIS — Z23 Encounter for immunization: Secondary | ICD-10-CM

## 2018-05-08 NOTE — Progress Notes (Addendum)
Subjective:   SANDRA TELLEFSEN is a 83 y.o. male who presents for Medicare Annual/Subsequent preventive examination.  Review of Systems:  No ROS.  Medicare Wellness Visit. Additional risk factors are reflected in the social history. Cardiac Risk Factors include: advanced age (>57men, >8 women);hypertension;male gender     Objective:    Vitals: BP 130/70 (BP Location: Left Arm, Patient Position: Sitting, Cuff Size: Normal)   Pulse 80   Temp 98.2 F (36.8 C) (Oral)   Resp 15   Ht 5\' 11"  (1.803 m)   Wt 196 lb 12.8 oz (89.3 kg)   SpO2 95%   BMI 27.45 kg/m   Body mass index is 27.45 kg/m.  Advanced Directives 05/08/2018 06/26/2017 05/06/2017 03/24/2017 03/24/2017 07/05/2016 04/30/2016  Does Patient Have a Medical Advance Directive? Yes Yes Yes Yes Yes Yes Yes  Type of Paramedic of [place];Living will Living will;Healthcare Power of [place];Living will [place];Living will Living will;Healthcare Power of [place];Living will [place];Living will  Does patient want to make changes to medical advance directive? No - Patient declined - No - Patient declined No - Patient declined - - No - Patient declined  Copy of [place] in Chart? No - copy requested - No - copy requested No - copy requested - - No - copy requested    Tobacco Social History   Tobacco Use  Smoking Status Never Smoker  Smokeless Tobacco Never Used     Counseling given: Not Answered   Clinical Intake:  Pre-visit preparation completed: Yes           How often do you need to have someone help you when you read instructions, pamphlets, or other written materials from your doctor or pharmacy?: 1 - Never  Interpreter Needed?: No     Past Medical History:  Diagnosis Date  . Carotid arterial disease ([place])    a. 02/2016 L CEA; b. 18/8416 U/S: patent LICA, 6-06% RICA.  Marland Kitchen  Coronary artery disease    a. 01/2016 MV: mild apical/basal inferior, apical lateral, mid anterolateral, and mid inferolateral ischemia. EF 57%; b. 02/2016 Cath: LM 40ost, LAD 70p/m, 20d, D1 95 (small), LCX nl, RCA 67m, RPDA 90 (small), EF 55-65%-->med Rx. Rec CABG for recurrent symptoms.  . History of echocardiogram    a. 02/2016 Echo: EF 50-55%, no rwma, mild MR.  Marland Kitchen Hypercholesterolemia   . Hypertension    Past Surgical History:  Procedure Laterality Date  . CARDIAC CATHETERIZATION N/A 01/19/2016   Procedure: Left Heart Cath and Coronary Angiography;  Surgeon: Wellington Hampshire, MD;  Location: [facility];  Service: Cardiovascular;  Laterality: N/A;  . ENDARTERECTOMY Left 02/15/2016   Procedure: ENDARTERECTOMY CAROTID;  Surgeon: Algernon Huxley, MD;  Location: [facility];  Service: Vascular;  Laterality: Left;  . NO PAST SURGERIES     Family History  Problem Relation Age of Onset  . Heart disease Father        MI  . Heart attack Father   . Stroke Mother   . Breast cancer Sister   . Colon cancer Neg Hx   . Prostate cancer Neg Hx    Social History   Socioeconomic History  . Marital status: Married    Spouse name: Not on file  . Number of children: 1  . Years of education: Not on file  . Highest education level: Not on file  Occupational  History    Employer: [employer]  Social Needs  . Financial resource strain: Not hard at all  . Food insecurity:    Worry: Never true    Inability: Never true  . Transportation needs:    Medical: No    Non-medical: No  Tobacco Use  . Smoking status: Never Smoker  . Smokeless tobacco: Never Used  Substance and Sexual Activity  . Alcohol use: No    Alcohol/week: 0.0 standard drinks  . Drug use: No  . Sexual activity: Not Currently  Lifestyle  . Physical activity:    Days per week: 3 days    Minutes per session: 20 min  . Stress: Not at all  Relationships  . Social connections:    Talks on phone: Not on file    Gets together:  Not on file    Attends religious service: Not on file    Active member of club or organization: Not on file    Attends meetings of clubs or organizations: Not on file    Relationship status: Not on file  Other Topics Concern  . Not on file  Social History Narrative  . Not on file    Outpatient Encounter Medications as of 05/08/2018  Medication Sig  . aspirin 81 MG tablet Take 81 mg by mouth at bedtime.   . clopidogrel (PLAVIX) 75 MG tablet TAKE 1 TABLET (75 MG TOTAL) BY MOUTH DAILY WITH BREAKFAST.  Marland Kitchen lisinopril (PRINIVIL,ZESTRIL) 10 MG tablet TAKE 1 TABLET BY MOUTH EVERY DAY (Patient taking differently: 5 mg daily. )  . metoprolol succinate (TOPROL-XL) 100 MG 24 hr tablet TAKE 1 TABLET BY MOUTH EVERY DAY WITH A MEAL  . Multiple Vitamins-Iron (MULTIVITAMINS WITH IRON) TABS Take 1 tablet by mouth daily.  . nitroGLYCERIN (NITROSTAT) 0.4 MG SL tablet Place 1 tablet (0.4 mg total) under the tongue every 5 (five) minutes as needed.  . rosuvastatin (CRESTOR) 10 MG tablet Take 1 tablet (10 mg total) by mouth daily.  . vitamin B-12 (CYANOCOBALAMIN) 1000 MCG tablet Take 1,000 mcg by mouth daily.   No facility-administered encounter medications on file as of 05/08/2018.     Activities of Daily Living In your present state of health, do you have any difficulty performing the following activities: 05/08/2018  Hearing? Y  Vision? N  Difficulty concentrating or making decisions? N  Walking or climbing stairs? N  Dressing or bathing? N  Doing errands, shopping? N  Preparing Food and eating ? N  Using the Toilet? N  In the past six months, have you accidently leaked urine? N  Do you have problems with loss of bowel control? N  Managing your Medications? N  Managing your Finances? N  Housekeeping or managing your Housekeeping? N  Some recent data might be hidden    Patient Care Team: Einar Pheasant, MD as PCP - General (Internal Medicine) Einar Pheasant, MD (Internal Medicine) Bary Castilla  Forest Gleason, MD (General Surgery)   Assessment:   This is a routine wellness examination for [place].  Influenza administered R deltoid, tolerated well.   Health Screenings  Colonoscopy -08/12/13 PSA -09/24/16 (0.63) Glaucoma -yes. Followed by Dr Luisa Dago, [place] Hearing -hearing aids TSH-06/26/17 (2.70) Glucose-05/12/17 (101) Cholesterol -05/12/17 (92) Dental-dentures Vision-visits every 6 months  Social  Alcohol intake -no Smoking history- never Smokers in home? none Illicit drug use?noone Exercise -walking Diet -regular Sexually Active -not currently  Safety  Patient feels safe at home.  Patient does have smoke detectors at home  Patient does  wear sunscreen or protective clothing when in direct sunlight. Patient does wear seat belt when driving or riding with others.   Activities of Daily Living Patient can do their own household chores. Denies needing assistance with: driving, feeding themselves, getting from bed to chair, getting to the toilet, bathing/showering, dressing, managing money, climbing flight of stairs, or preparing meals.   Depression Screen Patient denies losing interest in daily life, feeling hopeless, or crying easily over simple problems.   Fall Screen Patient denies being afraid of falling or falling in the last year.   Memory Screen Patient denies problems with memory, misplacing items, and is able to balance checkbook/bank accounts.  Patient is alert, normal appearance, oriented to person/place/and time. Correctly identified the president of the [place], recall of 2/3 objects, and performing simple calculations.  Patient displays appropriate judgement and can read correct time from watch face.   Immunizations The following Immunizations are up to date: Influenza, shingles, pneumonia, and tetanus.   Other Providers Patient Care Team: Einar Pheasant, MD as PCP - General (Internal Medicine) Einar Pheasant, MD (Internal Medicine) Bary Castilla Forest Gleason, MD  (General Surgery)  Exercise Activities and Dietary recommendations Current Exercise Habits: Home exercise routine, Type of exercise: walking, Time (Minutes): 20, Frequency (Times/Week): 3, Weekly Exercise (Minutes/Week): 60  Goals      Patient Stated   . Weight (lb) < 190 lb (86.2 kg) (pt-stated)     Portion control Low carb diet        Fall Risk Fall Risk  05/08/2018 05/06/2017 04/30/2016 09/07/2015 09/06/2014  Falls in the past year? 0 No No No No   Depression Screen PHQ 2/9 Scores 05/08/2018 05/06/2017 09/24/2016 04/30/2016  PHQ - 2 Score 0 0 0 0  PHQ- 9 Score - - 4 -    Cognitive Function MMSE - Mini Mental State Exam 05/06/2017 04/30/2016  Orientation to time 5 5  Orientation to Place 5 5  Registration 3 3  Attention/ Calculation 5 5  Recall 2 3  Language- name 2 objects 2 2  Language- repeat 1 1  Language- follow 3 step command 3 3  Language- read & follow direction 1 1  Write a sentence 1 1  Copy design 1 1  Total score 29 30     6CIT Screen 05/08/2018  What Year? 0 points  What month? 0 points  What time? 0 points  Count back from 20 0 points  Months in reverse 0 points  Repeat phrase 0 points  Total Score 0    Immunization History  Administered Date(s) Administered  . Influenza, High Dose Seasonal PF 12/27/2016, 05/08/2018  . Influenza,inj,Quad PF,6+ Mos 01/21/2013, 11/22/2013, 03/09/2015, 02/16/2016  . Pneumococcal Conjugate-13 02/01/2014  . Pneumococcal Polysaccharide-23 04/30/2016  . Td 12/21/2012   Screening Tests Health Maintenance  Topic Date Due  . TETANUS/TDAP  12/22/2022  . INFLUENZA VACCINE  Completed  . PNA vac Low Risk Adult  Completed       Plan:    End of life planning; Advance aging; Advanced directives discussed. Copy of current HCPOA/Living Will requested.    I have personally reviewed and noted the following in the patient's chart:   . Medical and social history . Use of alcohol, tobacco or illicit drugs  . Current medications and  supplements . Functional ability and status . Nutritional status . Physical activity . Advanced directives . List of other physicians . Hospitalizations, surgeries, and ER visits in previous 12 months . Vitals . Screenings to include cognitive,  depression, and falls . Referrals and appointments  In addition, I have reviewed and discussed with patient certain preventive protocols, quality metrics, and best practice recommendations. A written personalized care plan for preventive services as well as general preventive health recommendations were provided to patient.     Varney Biles, LPN  8/0/3212   Reviewed above information.  Agree with assessment and plan.   Dr Nicki Reaper

## 2018-05-08 NOTE — Patient Instructions (Addendum)
  Mr. Strohecker , Thank you for taking time to come for your Medicare Wellness Visit. I appreciate your ongoing commitment to your health goals. Please review the following plan we discussed and let me know if I can assist you in the future.   These are the goals we discussed: Goals      Patient Stated   . Weight (lb) < 190 lb (86.2 kg) (pt-stated)     Portion control Low carb diet        This is a list of the screening recommended for you and due dates:  Health Maintenance  Topic Date Due  . Tetanus Vaccine  12/22/2022  . Flu Shot  Completed  . Pneumonia vaccines  Completed

## 2018-05-13 DIAGNOSIS — T162XXA Foreign body in left ear, initial encounter: Secondary | ICD-10-CM | POA: Diagnosis not present

## 2018-05-13 DIAGNOSIS — H903 Sensorineural hearing loss, bilateral: Secondary | ICD-10-CM | POA: Diagnosis not present

## 2018-05-13 DIAGNOSIS — T161XXA Foreign body in right ear, initial encounter: Secondary | ICD-10-CM | POA: Diagnosis not present

## 2018-05-21 ENCOUNTER — Telehealth: Payer: Self-pay | Admitting: Internal Medicine

## 2018-05-21 NOTE — Telephone Encounter (Signed)
-----   Message from Lars Masson, LPN sent at 8/50/2774  3:20 PM EDT ----- Regarding: RE: f/u question Patient is scheduled to see Dr. Lucky Cowboy on 3/30 at 9:00 am ----- Message ----- From: Einar Pheasant, MD Sent: 05/18/2018   8:51 PM EDT To: Lars Masson, LPN Subject: FW: f/u question                               See me about this before calling pt. Call and notify pt that he needs a f/u aortic ultrasound to confirm no change from previous.  This will be performed at Dr Bunnie Domino office.  Let me know if agreeable and I will place the order.    Thanks.   Dr Nicki Reaper ----- Message ----- From: Algernon Huxley, MD Sent: 05/18/2018   9:26 AM EDT To: Einar Pheasant, MD, Devona Konig, CMA Subject: RE: f/u question                               That should probably be checked annually at this point, so yes we should get him back in with an aortoiliac duplex in the next couple of weeks.  Thanks for catching that.  Corene Cornea  ----- Message ----- From: Einar Pheasant, MD Sent: 05/17/2018   8:48 AM EDT To: Algernon Huxley, MD Subject: f/u question                                   Mr Lieurance is a patient that you have seen for f/u - carotid stenosis.  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  Does this require follow up?  If so, let me know and I can arrange f/u with you.    Thanks for your help.  Einar Pheasant

## 2018-05-27 ENCOUNTER — Telehealth: Payer: Self-pay

## 2018-05-27 NOTE — Telephone Encounter (Signed)
Copied from [place] [id number]. Topic: General - Other >> May 27, 2018  9:29 AM Marin Olp L wrote: Reason for CRM: Wife, priscilla, calling to find out if pt needs to keep his appt at vascular on 06/01/2018?

## 2018-05-29 NOTE — Telephone Encounter (Signed)
LMTCB

## 2018-05-29 NOTE — Telephone Encounter (Signed)
Patient wife called back she is very worried about her husband going in for his appointment at the Vascular clinic on 06/01/2018. Per wife the appointment was already confirmed by her husband but she is having a hard time coming to terms because of his age she would like him not to go in. Wife is asking for a call back from Dr Nicki Reaper or her nurse before the end of the day today.

## 2018-05-29 NOTE — Telephone Encounter (Signed)
Advised patients wife that she could postpone appt if they were uncomfortable going to the office but also advised them to call [facility] to see what they recommend/how they are seeing patients.

## 2018-06-01 ENCOUNTER — Other Ambulatory Visit: Payer: Self-pay

## 2018-06-01 ENCOUNTER — Encounter (INDEPENDENT_AMBULATORY_CARE_PROVIDER_SITE_OTHER): Payer: Self-pay | Admitting: Nurse Practitioner

## 2018-06-01 ENCOUNTER — Other Ambulatory Visit (INDEPENDENT_AMBULATORY_CARE_PROVIDER_SITE_OTHER): Payer: Self-pay | Admitting: Vascular Surgery

## 2018-06-01 ENCOUNTER — Ambulatory Visit (INDEPENDENT_AMBULATORY_CARE_PROVIDER_SITE_OTHER): Payer: Medicare HMO | Admitting: Nurse Practitioner

## 2018-06-01 ENCOUNTER — Ambulatory Visit (INDEPENDENT_AMBULATORY_CARE_PROVIDER_SITE_OTHER): Payer: Medicare HMO

## 2018-06-01 VITALS — BP 159/77 | HR 72 | Resp 16 | Ht 71.0 in | Wt 194.0 lb

## 2018-06-01 DIAGNOSIS — I723 Aneurysm of iliac artery: Secondary | ICD-10-CM

## 2018-06-01 DIAGNOSIS — Z79899 Other long term (current) drug therapy: Secondary | ICD-10-CM | POA: Diagnosis not present

## 2018-06-01 DIAGNOSIS — I10 Essential (primary) hypertension: Secondary | ICD-10-CM

## 2018-06-01 DIAGNOSIS — E7800 Pure hypercholesterolemia, unspecified: Secondary | ICD-10-CM | POA: Diagnosis not present

## 2018-06-01 NOTE — Progress Notes (Signed)
SUBJECTIVE:  Patient ID: Brett Riley, male    DOB: 05-24-1935, 83 y.o.   MRN: [id number] Chief Complaint  Patient presents with  . Follow-up    u/s follow up    HPI  Brett Riley is a 83 y.o. male that presents today to follow-up on dilatation of the bilateral common iliac arteries.  The patient denies any claudication-like symptoms or rest pain.  He denies any signs or symptoms of peripheral embolization.  He denies any chest pain or shortness of breath.  He denies any TIA-like symptoms or amaurosis fugax.  He denies any fever, chills, nausea, vomiting or diarrhea.  Today his noninvasive studies reveal that he has moderate atherosclerosis throughout the abdominal aorta and bilateral common iliac arteries.  The largest measurement of his aorta was 2.35 at the proximal portion.  The right common iliac measures 1.5 x 1.4 cm.  The left common iliac at its largest is 1.7 x 1.8 cm in the midportion of the common iliac artery.  Previous studies done on 09/23/2016 are relatively unchanged.  Past Medical History:  Diagnosis Date  . Carotid arterial disease ([place])    a. 02/2016 L CEA; b. 37/1696 U/S: patent LICA, 7-89% RICA.  Marland Kitchen Coronary artery disease    a. 01/2016 MV: mild apical/basal inferior, apical lateral, mid anterolateral, and mid inferolateral ischemia. EF 57%; b. 02/2016 Cath: LM 40ost, LAD 70p/m, 20d, D1 95 (small), LCX nl, RCA 75m, RPDA 90 (small), EF 55-65%-->med Rx. Rec CABG for recurrent symptoms.  . History of echocardiogram    a. 02/2016 Echo: EF 50-55%, no rwma, mild MR.  Marland Kitchen Hypercholesterolemia   . Hypertension     Past Surgical History:  Procedure Laterality Date  . CARDIAC CATHETERIZATION N/A 01/19/2016   Procedure: Left Heart Cath and Coronary Angiography;  Surgeon: Wellington Hampshire, MD;  Location: [facility];  Service: Cardiovascular;  Laterality: N/A;  . ENDARTERECTOMY Left 02/15/2016   Procedure: ENDARTERECTOMY CAROTID;  Surgeon: Algernon Huxley, MD;   Location: [facility];  Service: Vascular;  Laterality: Left;  . NO PAST SURGERIES      Social History   Socioeconomic History  . Marital status: Married    Spouse name: Not on file  . Number of children: 1  . Years of education: Not on file  . Highest education level: Not on file  Occupational History    Employer: [employer]  Social Needs  . Financial resource strain: Not hard at all  . Food insecurity:    Worry: Never true    Inability: Never true  . Transportation needs:    Medical: No    Non-medical: No  Tobacco Use  . Smoking status: Never Smoker  . Smokeless tobacco: Never Used  Substance and Sexual Activity  . Alcohol use: No    Alcohol/week: 0.0 standard drinks  . Drug use: No  . Sexual activity: Not Currently  Lifestyle  . Physical activity:    Days per week: 3 days    Minutes per session: 20 min  . Stress: Not at all  Relationships  . Social connections:    Talks on phone: Not on file    Gets together: Not on file    Attends religious service: Not on file    Active member of club or organization: Not on file    Attends meetings of clubs or organizations: Not on file    Relationship status: Not on file  . Intimate partner violence:  Fear of current or ex partner: No    Emotionally abused: No    Physically abused: No    Forced sexual activity: No  Other Topics Concern  . Not on file  Social History Narrative  . Not on file    Family History  Problem Relation Age of Onset  . Heart disease Father        MI  . Heart attack Father   . Stroke Mother   . Breast cancer Sister   . Colon cancer Neg Hx   . Prostate cancer Neg Hx     No Known Allergies   Review of Systems   Review of Systems: Negative Unless Checked Constitutional: [] Weight loss  [] Fever  [] Chills Cardiac: [] Chest pain   []  Atrial Fibrillation  [] Palpitations   [] Shortness of breath when laying flat   [] Shortness of breath with exertion. [] Shortness of breath at rest Vascular:   [] Pain in legs with walking   [] Pain in legs with standing [] Pain in legs when laying flat   [] Claudication    [] Pain in feet when laying flat    [] History of DVT   [] Phlebitis   [] Swelling in legs   [] Varicose veins   [] Non-healing ulcers Pulmonary:   [] Uses home oxygen   [] Productive cough   [] Hemoptysis   [] Wheeze  [] COPD   [] Asthma Neurologic:  [] Dizziness   [] Seizures  [] Blackouts [] History of stroke   [] History of TIA  [] Aphasia   [] Temporary Blindness   [] Weakness or numbness in arm   [] Weakness or numbness in leg Musculoskeletal:   [] Joint swelling   [] Joint pain   [] Low back pain  []  History of Knee Replacement [x] Arthritis [] back Surgeries  []  Spinal Stenosis    Hematologic:  [] Easy bruising  [] Easy bleeding   [] Hypercoagulable state   [] Anemic Gastrointestinal:  [] Diarrhea   [] Vomiting  [] Gastroesophageal reflux/heartburn   [] Difficulty swallowing. [] Abdominal pain Genitourinary:  [] Chronic kidney disease   [] Difficult urination  [] Anuric   [] Blood in urine [] Frequent urination  [] Burning with urination   [] Hematuria Skin:  [] Rashes   [] Ulcers [] Wounds Psychological:  [] History of anxiety   []  History of major depression  []  Memory Difficulties      OBJECTIVE:   Physical Exam  BP (!) 159/77 (BP Location: Right Arm)   Pulse 72   Resp 16   Ht 5\' 11"  (1.803 m)   Wt 194 lb (88 kg)   BMI 27.06 kg/m   Gen: WD/WN, NAD Head: [place]/AT, No temporalis wasting.  Ear/Nose/Throat: Hearing grossly intact, nares w/o erythema or drainage Eyes: PER, EOMI, sclera nonicteric.  Neck: Supple, no masses.  No JVD.  Pulmonary:  Good air movement, no use of accessory muscles.  Cardiac: RRR Vascular:  Vessel Right Left  Radial Palpable Palpable  Dorsalis Pedis Palpable Palpable  Posterior Tibial Palpable Palpable   Gastrointestinal: soft, non-distended. No guarding/no peritoneal signs.  Musculoskeletal: M/S 5/5 throughout.  No deformity or atrophy.  Neurologic: Pain and light touch intact in  extremities.  Symmetrical.  Speech is fluent. Motor exam as listed above. Psychiatric: Judgment intact, Mood & affect appropriate for pt's clinical situation. Dermatologic: No Venous rashes. No Ulcers Noted.  No changes consistent with cellulitis. Lymph : No Cervical lymphadenopathy, no lichenification or skin changes of chronic lymphedema.       ASSESSMENT AND PLAN:  1. Aneurysm artery, iliac common (HCC) Today his noninvasive studies reveal that he has moderate atherosclerosis throughout the abdominal aorta and bilateral common iliac arteries.  The largest  measurement of his aorta was 2.35 at the proximal portion.  The right common iliac measures 1.5 x 1.4 cm.  The left common iliac at its largest is 1.7 x 1.8 cm in the midportion of the common iliac artery.  Previous studies done on 09/23/2016 are relatively unchanged.  The patient has an asymptomatic illiac artery aneurysm that is less than 3 cm in maximal diameter.  I have discussed the natural history of illiac aneurysm and the small risk of thrombosis for aneurysm less than 3 cm in size.  However, as these small aneurysms tend to enlarge over time, continued surveillance with ultrasound is mandatory.   I have also discussed optimizing medical management with hypertension and lipid control and the importance of abstinence from tobacco.  The patient is also encouraged to exercise a minimum of 30 minutes 4 times a week.   Should the patient develop new leg pain or signs of peripheral embolization they are instructed to seek medical attention immediately and to alert the physician providing care that they have an aneurysm.  The patient voices their understanding.  She will follow-up in 1 year for noninvasive studies.  - VAS US AORTA/IVC/ILIACS; Future  2. Hypercholesterolemia Continue statin as ordered and reviewed, no changes at this time   3. Essential hypertension, benign Continue antihypertensive medications as already ordered,  these medications have been reviewed and there are no changes at this time.    Current Outpatient Medications on File Prior to Visit  Medication Sig Dispense Refill  . aspirin 81 MG tablet Take 81 mg by mouth at bedtime.     . clopidogrel (PLAVIX) 75 MG tablet TAKE 1 TABLET (75 MG TOTAL) BY MOUTH DAILY WITH BREAKFAST. 90 tablet 1  . lisinopril (PRINIVIL,ZESTRIL) 10 MG tablet TAKE 1 TABLET BY MOUTH EVERY DAY (Patient taking differently: 5 mg daily. ) 90 tablet 1  . metoprolol succinate (TOPROL-XL) 100 MG 24 hr tablet TAKE 1 TABLET BY MOUTH EVERY DAY WITH A MEAL 90 tablet 2  . Multiple Vitamins-Iron (MULTIVITAMINS WITH IRON) TABS Take 1 tablet by mouth daily.    . nitroGLYCERIN (NITROSTAT) 0.4 MG SL tablet Place 1 tablet (0.4 mg total) under the tongue every 5 (five) minutes as needed. 25 tablet 6  . rosuvastatin (CRESTOR) 10 MG tablet Take 1 tablet (10 mg total) by mouth daily. 90 tablet 2  . vitamin B-12 (CYANOCOBALAMIN) 1000 MCG tablet Take 1,000 mcg by mouth daily.     No current facility-administered medications on file prior to visit.     There are no Patient Instructions on file for this visit. Return in about 1 year (around 06/01/2019).   Kris Hartmann, NP  This note was completed with Sales executive.  Any errors are purely unintentional.

## 2018-06-26 ENCOUNTER — Other Ambulatory Visit: Payer: Self-pay

## 2018-06-26 ENCOUNTER — Ambulatory Visit (INDEPENDENT_AMBULATORY_CARE_PROVIDER_SITE_OTHER): Payer: Medicare HMO | Admitting: Internal Medicine

## 2018-06-26 ENCOUNTER — Encounter: Payer: Medicare HMO | Admitting: Internal Medicine

## 2018-06-26 ENCOUNTER — Encounter: Payer: Self-pay | Admitting: Internal Medicine

## 2018-06-26 DIAGNOSIS — I2510 Atherosclerotic heart disease of native coronary artery without angina pectoris: Secondary | ICD-10-CM | POA: Diagnosis not present

## 2018-06-26 DIAGNOSIS — D649 Anemia, unspecified: Secondary | ICD-10-CM

## 2018-06-26 DIAGNOSIS — I10 Essential (primary) hypertension: Secondary | ICD-10-CM

## 2018-06-26 DIAGNOSIS — D696 Thrombocytopenia, unspecified: Secondary | ICD-10-CM

## 2018-06-26 DIAGNOSIS — E7800 Pure hypercholesterolemia, unspecified: Secondary | ICD-10-CM

## 2018-06-26 DIAGNOSIS — I723 Aneurysm of iliac artery: Secondary | ICD-10-CM

## 2018-06-26 DIAGNOSIS — I6523 Occlusion and stenosis of bilateral carotid arteries: Secondary | ICD-10-CM | POA: Diagnosis not present

## 2018-06-26 NOTE — Assessment & Plan Note (Signed)
Follow cbc.  

## 2018-06-26 NOTE — Assessment & Plan Note (Signed)
Previous heart catheterization revealed 2 vessel CAD.  Saw cardiology 12/2017.  Stable.  Recommended f/u in one year.  On crestor.  Continue risk factor modificaiton.

## 2018-06-26 NOTE — Assessment & Plan Note (Signed)
Evaluated by [facility] 06/01/18 - stable.  Recommended f/u in one year.

## 2018-06-26 NOTE — Progress Notes (Addendum)
Patient ID: Brett Riley, male   DOB: 10/25/1935, 83 y.o.   MRN: [id number] Virtual Visit via Telephone Note  This visit type was conducted due to national recommendations for restrictions regarding the COVID-19 pandemic (e.g. social distancing).  This format is felt to be most appropriate for this patient at this time.  All issues noted in this document were discussed and addressed.  No physical exam was performed (except for noted visual exam findings with Video Visits).   I connected with Brett Riley by telephone and verified that I am speaking with the correct person using two identifiers. Location patient: work Location provider: work Persons participating in the virtual visit: patient, provider  I discussed the limitations, risks, security and privacy concerns of performing an evaluation and management service by telephone and the availability of in person appointments. The patient expressed understanding and agreed to proceed.   Reason for visit: scheduled follow up.   HPI: He reports he is doing well.  Still working.  Staying active.  No chest pain.  No sob.  No acid reflux.  No abdominal pain.  Bowels moving.  No urine change.  Saw vascular surgery for f/u iliac artery aneurysm.  Stable.  Recommended f/u in one year.  Also saw cardiology 12/2017.  Stable.  Recommended f/u in one year.  Overdue f/u for labs.  Needs f/u platelet count.  Taking crestor.  Monitoring blood pressures.  Blood pressure averaging - 120-130/60-70s.  No dizziness or light headedness.  No known COVID exposure.  No fever.  No cough, congestion or sob.  Blood pressures doing well.  Blood pressures averaging 120s/60-70s.    ROS: See pertinent positives and negatives per HPI.  Past Medical History:  Diagnosis Date  . Carotid arterial disease ([place])    a. 02/2016 L CEA; b. 87/5643 U/S: patent LICA, 3-29% RICA.  Marland Kitchen Coronary artery disease    a. 01/2016 MV: mild apical/basal inferior, apical lateral, mid  anterolateral, and mid inferolateral ischemia. EF 57%; b. 02/2016 Cath: LM 40ost, LAD 70p/m, 20d, D1 95 (small), LCX nl, RCA 52m, RPDA 90 (small), EF 55-65%-->med Rx. Rec CABG for recurrent symptoms.  . History of echocardiogram    a. 02/2016 Echo: EF 50-55%, no rwma, mild MR.  Marland Kitchen Hypercholesterolemia   . Hypertension     Past Surgical History:  Procedure Laterality Date  . CARDIAC CATHETERIZATION N/A 01/19/2016   Procedure: Left Heart Cath and Coronary Angiography;  Surgeon: Wellington Hampshire, MD;  Location: [place] [facility];  Service: Cardiovascular;  Laterality: N/A;  . ENDARTERECTOMY Left 02/15/2016   Procedure: ENDARTERECTOMY CAROTID;  Surgeon: Algernon Huxley, MD;  Location: [facility];  Service: Vascular;  Laterality: Left;  . NO PAST SURGERIES      Family History  Problem Relation Age of Onset  . Heart disease Father        MI  . Heart attack Father   . Stroke Mother   . Breast cancer Sister   . Colon cancer Neg Hx   . Prostate cancer Neg Hx     SOCIAL HX: reviewed.    Current Outpatient Medications:  .  aspirin 81 MG tablet, Take 81 mg by mouth at bedtime. , Disp: , Rfl:  .  clopidogrel (PLAVIX) 75 MG tablet, TAKE 1 TABLET (75 MG TOTAL) BY MOUTH DAILY WITH BREAKFAST., Disp: 90 tablet, Rfl: 1 .  lisinopril (PRINIVIL,ZESTRIL) 10 MG tablet, TAKE 1 TABLET BY MOUTH EVERY DAY (Patient taking differently: 5 mg daily. ), Disp:  90 tablet, Rfl: 1 .  metoprolol succinate (TOPROL-XL) 100 MG 24 hr tablet, TAKE 1 TABLET BY MOUTH EVERY DAY WITH A MEAL, Disp: 90 tablet, Rfl: 2 .  Multiple Vitamins-Iron (MULTIVITAMINS WITH IRON) TABS, Take 1 tablet by mouth daily., Disp: , Rfl:  .  nitroGLYCERIN (NITROSTAT) 0.4 MG SL tablet, Place 1 tablet (0.4 mg total) under the tongue every 5 (five) minutes as needed., Disp: 25 tablet, Rfl: 6 .  rosuvastatin (CRESTOR) 10 MG tablet, Take 1 tablet (10 mg total) by mouth daily., Disp: 90 tablet, Rfl: 2 .  vitamin B-12 (CYANOCOBALAMIN) 1000 MCG tablet, Take  1,000 mcg by mouth daily., Disp: , Rfl:   EXAM:  VITALS per patient if applicable: 322/02  GENERAL: alert, oriented, appears well and in no acute distress  HEENT: atraumatic, conjunttiva clear, no obvious abnormalities on inspection of external nose and ears  NECK: normal movements of the head and neck  LUNGS: on inspection no signs of respiratory distress, breathing rate appears normal, no obvious gross SOB, gasping or wheezing  CV: no obvious cyanosis  PSYCH/NEURO: pleasant and cooperative, no obvious depression or anxiety, speech and thought processing grossly intact  ASSESSMENT AND PLAN:  Discussed the following assessment and plan:  Aneurysm artery, iliac common (HCC)  Anemia, unspecified type  Coronary artery disease involving native coronary artery of native heart without angina pectoris  Bilateral carotid artery stenosis  Essential hypertension, benign  Hypercholesterolemia  Thrombocytopenia (HCC)  Aneurysm artery, iliac common ([place]) Evaluated by [facility] 06/01/18 - stable.  Recommended f/u in one year.    Anemia Follow cbc.   CAD (coronary artery disease) Previous heart catheterization revealed 2 vessel CAD.  Saw cardiology 12/2017.  Stable.  Recommended f/u in one year.  On crestor.  Continue risk factor modificaiton.   Carotid stenosis S/p CEA.  Followed by [facility].  Continue aggressive risk factor modification.  Continue aspirin and plavix.   Essential hypertension, benign Blood pressures as outlined.  Continue current medication.  Follow pressures.  Follow metabolic panel.   Hypercholesterolemia On crestor.  Low cholesterol diet and exercise.  Follow lipid panel and liver function tests.    Thrombocytopenia ([place]) Follow cbc.      I discussed the assessment and treatment plan with the patient. The patient was provided an opportunity to ask questions and all were answered. The patient agreed with the plan and demonstrated an understanding of the  instructions.   The patient was advised to call back or seek an in-person evaluation if the symptoms worsen or if the condition fails to improve as anticipated.  I provided 15 minutes of non-face-to-face time during this encounter.   Einar Pheasant, MD

## 2018-06-28 NOTE — Assessment & Plan Note (Signed)
Blood pressures as outlined.  Continue current medication.  Follow pressures.  Follow metabolic panel.

## 2018-06-28 NOTE — Assessment & Plan Note (Signed)
S/p CEA.  Followed by [facility].  Continue aggressive risk factor modification.  Continue aspirin and plavix.

## 2018-06-28 NOTE — Assessment & Plan Note (Signed)
Follow cbc.  

## 2018-06-28 NOTE — Assessment & Plan Note (Signed)
On crestor.  Low cholesterol diet and exercise.  Follow lipid panel and liver function tests.   

## 2018-07-29 ENCOUNTER — Other Ambulatory Visit: Payer: Self-pay | Admitting: Internal Medicine

## 2018-09-11 ENCOUNTER — Other Ambulatory Visit: Payer: Self-pay | Admitting: Internal Medicine

## 2018-09-28 DIAGNOSIS — L578 Other skin changes due to chronic exposure to nonionizing radiation: Secondary | ICD-10-CM | POA: Diagnosis not present

## 2018-09-28 DIAGNOSIS — Z86018 Personal history of other benign neoplasm: Secondary | ICD-10-CM | POA: Diagnosis not present

## 2018-09-28 DIAGNOSIS — Z872 Personal history of diseases of the skin and subcutaneous tissue: Secondary | ICD-10-CM | POA: Diagnosis not present

## 2018-09-28 DIAGNOSIS — Z859 Personal history of malignant neoplasm, unspecified: Secondary | ICD-10-CM | POA: Diagnosis not present

## 2018-09-28 DIAGNOSIS — Z85828 Personal history of other malignant neoplasm of skin: Secondary | ICD-10-CM | POA: Diagnosis not present

## 2018-09-28 DIAGNOSIS — L570 Actinic keratosis: Secondary | ICD-10-CM | POA: Diagnosis not present

## 2018-10-05 ENCOUNTER — Other Ambulatory Visit: Payer: Self-pay

## 2018-10-06 ENCOUNTER — Encounter: Payer: Self-pay | Admitting: Internal Medicine

## 2018-10-06 ENCOUNTER — Ambulatory Visit (INDEPENDENT_AMBULATORY_CARE_PROVIDER_SITE_OTHER): Payer: Medicare HMO | Admitting: Internal Medicine

## 2018-10-06 VITALS — BP 142/86 | HR 64 | Temp 98.8°F | Ht 71.0 in | Wt 193.2 lb

## 2018-10-06 DIAGNOSIS — Z0000 Encounter for general adult medical examination without abnormal findings: Secondary | ICD-10-CM

## 2018-10-06 DIAGNOSIS — Z125 Encounter for screening for malignant neoplasm of prostate: Secondary | ICD-10-CM | POA: Diagnosis not present

## 2018-10-06 DIAGNOSIS — I10 Essential (primary) hypertension: Secondary | ICD-10-CM

## 2018-10-06 DIAGNOSIS — I723 Aneurysm of iliac artery: Secondary | ICD-10-CM | POA: Diagnosis not present

## 2018-10-06 DIAGNOSIS — D649 Anemia, unspecified: Secondary | ICD-10-CM | POA: Diagnosis not present

## 2018-10-06 DIAGNOSIS — E7800 Pure hypercholesterolemia, unspecified: Secondary | ICD-10-CM | POA: Diagnosis not present

## 2018-10-06 DIAGNOSIS — I6523 Occlusion and stenosis of bilateral carotid arteries: Secondary | ICD-10-CM | POA: Diagnosis not present

## 2018-10-06 DIAGNOSIS — D696 Thrombocytopenia, unspecified: Secondary | ICD-10-CM | POA: Diagnosis not present

## 2018-10-06 DIAGNOSIS — I2510 Atherosclerotic heart disease of native coronary artery without angina pectoris: Secondary | ICD-10-CM | POA: Diagnosis not present

## 2018-10-06 LAB — LIPID PANEL
Cholesterol: 105 mg/dL (ref 0–200)
HDL: 36.3 mg/dL — ABNORMAL LOW
LDL Cholesterol: 48 mg/dL (ref 0–99)
NonHDL: 68.61
Total CHOL/HDL Ratio: 3
Triglycerides: 102 mg/dL (ref 0.0–149.0)
VLDL: 20.4 mg/dL (ref 0.0–40.0)

## 2018-10-06 LAB — CBC WITH DIFFERENTIAL/PLATELET
Basophils Absolute: 0 10*3/uL (ref 0.0–0.1)
Basophils Relative: 0.6 % (ref 0.0–3.0)
Eosinophils Absolute: 0.2 10*3/uL (ref 0.0–0.7)
Eosinophils Relative: 3.7 % (ref 0.0–5.0)
HCT: 38.5 % — ABNORMAL LOW (ref 39.0–52.0)
Hemoglobin: 12.9 g/dL — ABNORMAL LOW (ref 13.0–17.0)
Lymphocytes Relative: 35.7 % (ref 12.0–46.0)
Lymphs Abs: 1.8 10*3/uL (ref 0.7–4.0)
MCHC: 33.6 g/dL (ref 30.0–36.0)
MCV: 98.2 fl (ref 78.0–100.0)
Monocytes Absolute: 0.4 10*3/uL (ref 0.1–1.0)
Monocytes Relative: 8.6 % (ref 3.0–12.0)
Neutro Abs: 2.6 10*3/uL (ref 1.4–7.7)
Neutrophils Relative %: 51.4 % (ref 43.0–77.0)
Platelets: 139 10*3/uL — ABNORMAL LOW (ref 150.0–400.0)
RBC: 3.92 Mil/uL — ABNORMAL LOW (ref 4.22–5.81)
RDW: 13.2 % (ref 11.5–15.5)
WBC: 5 10*3/uL (ref 4.0–10.5)

## 2018-10-06 LAB — HEPATIC FUNCTION PANEL
ALT: 16 U/L (ref 0–53)
AST: 19 U/L (ref 0–37)
Albumin: 4.6 g/dL (ref 3.5–5.2)
Alkaline Phosphatase: 68 U/L (ref 39–117)
Bilirubin, Direct: 0.1 mg/dL (ref 0.0–0.3)
Total Bilirubin: 0.6 mg/dL (ref 0.2–1.2)
Total Protein: 7 g/dL (ref 6.0–8.3)

## 2018-10-06 LAB — BASIC METABOLIC PANEL WITH GFR
BUN: 24 mg/dL — ABNORMAL HIGH (ref 6–23)
CO2: 29 meq/L (ref 19–32)
Calcium: 9.9 mg/dL (ref 8.4–10.5)
Chloride: 104 meq/L (ref 96–112)
Creatinine, Ser: 0.93 mg/dL (ref 0.40–1.50)
GFR: 78.02 mL/min
Glucose, Bld: 92 mg/dL (ref 70–99)
Potassium: 4.8 meq/L (ref 3.5–5.1)
Sodium: 139 meq/L (ref 135–145)

## 2018-10-06 LAB — PSA, MEDICARE: PSA: 1.61 ng/mL (ref 0.10–4.00)

## 2018-10-06 NOTE — Assessment & Plan Note (Signed)
Physical today 10/06/18.  Colonoscopy 08/12/13 - normal.

## 2018-10-06 NOTE — Progress Notes (Signed)
Patient ID: Brett Riley, male   DOB: 02-17-1936, 83 y.o.   MRN: [id number]   Subjective:    Patient ID: Brett Riley, male    DOB: 1935/03/29, 83 y.o.   MRN: [id number]  HPI  Patient here for his physical exam.  He reports he is doing well.  Feels good.  Working.  Stays active.  No chest pain.  No sob.  No acid reflux.  No abdominal pain.  Bowels moving.  Taking crestor.  Followed by cardiology and [facility].  Stable.     Past Medical History:  Diagnosis Date   Carotid arterial disease ([place])    a. 02/2016 L CEA; b. 11/3816 U/S: patent LICA, 2-99% RICA.   Coronary artery disease    a. 01/2016 MV: mild apical/basal inferior, apical lateral, mid anterolateral, and mid inferolateral ischemia. EF 57%; b. 02/2016 Cath: LM 40ost, LAD 70p/m, 20d, D1 95 (small), LCX nl, RCA 40m, RPDA 90 (small), EF 55-65%-->med Rx. Rec CABG for recurrent symptoms.   History of echocardiogram    a. 02/2016 Echo: EF 50-55%, no rwma, mild MR.   Hypercholesterolemia    Hypertension    Past Surgical History:  Procedure Laterality Date   CARDIAC CATHETERIZATION N/A 01/19/2016   Procedure: Left Heart Cath and Coronary Angiography;  Surgeon: Wellington Hampshire, MD;  Location: [place] [facility];  Service: Cardiovascular;  Laterality: N/A;   ENDARTERECTOMY Left 02/15/2016   Procedure: ENDARTERECTOMY CAROTID;  Surgeon: Algernon Huxley, MD;  Location: [facility];  Service: Vascular;  Laterality: Left;   NO PAST SURGERIES     Family History  Problem Relation Age of Onset   Heart disease Father        MI   Heart attack Father    Stroke Mother    Breast cancer Sister    Colon cancer Neg Hx    Prostate cancer Neg Hx    Social History   Socioeconomic History   Marital status: Married    Spouse name: Not on file   Number of children: 1   Years of education: Not on file   Highest education level: Not on file  Occupational History    Employer: [employer]  Social Needs   Financial resource strain:  Not hard at all   Food insecurity    Worry: Never true    Inability: Never true   Transportation needs    Medical: No    Non-medical: No  Tobacco Use   Smoking status: Never Smoker   Smokeless tobacco: Never Used  Substance and Sexual Activity   Alcohol use: No    Alcohol/week: 0.0 standard drinks   Drug use: No   Sexual activity: Not Currently  Lifestyle   Physical activity    Days per week: 3 days    Minutes per session: 20 min   Stress: Not at all  Relationships   Social connections    Talks on phone: Not on file    Gets together: Not on file    Attends religious service: Not on file    Active member of club or organization: Not on file    Attends meetings of clubs or organizations: Not on file    Relationship status: Not on file  Other Topics Concern   Not on file  Social History Narrative   Not on file    Outpatient Encounter Medications as of 10/06/2018  Medication Sig   aspirin 81 MG tablet Take 81 mg by mouth at bedtime.  clopidogrel (PLAVIX) 75 MG tablet TAKE 1 TABLET (75 MG TOTAL) BY MOUTH DAILY WITH BREAKFAST.   lisinopril (ZESTRIL) 10 MG tablet TAKE 1 TABLET BY MOUTH EVERY DAY   metoprolol succinate (TOPROL-XL) 100 MG 24 hr tablet TAKE 1 TABLET BY MOUTH EVERY DAY WITH A MEAL   Multiple Vitamins-Iron (MULTIVITAMINS WITH IRON) TABS Take 1 tablet by mouth daily.   nitroGLYCERIN (NITROSTAT) 0.4 MG SL tablet Place 1 tablet (0.4 mg total) under the tongue every 5 (five) minutes as needed.   rosuvastatin (CRESTOR) 10 MG tablet Take 1 tablet (10 mg total) by mouth daily.   vitamin B-12 (CYANOCOBALAMIN) 1000 MCG tablet Take 1,000 mcg by mouth daily.   No facility-administered encounter medications on file as of 10/06/2018.     Review of Systems  Constitutional: Negative for appetite change and unexpected weight change.  HENT: Negative for congestion and sinus pressure.   Eyes: Negative for pain and visual disturbance.  Respiratory: Negative for  cough, chest tightness and shortness of breath.   Cardiovascular: Negative for chest pain, palpitations and leg swelling.  Gastrointestinal: Negative for abdominal pain, diarrhea, nausea and vomiting.  Genitourinary: Negative for difficulty urinating and dysuria.  Musculoskeletal: Negative for joint swelling and myalgias.  Skin: Negative for color change and rash.  Neurological: Negative for dizziness, light-headedness and headaches.  Hematological: Negative for adenopathy. Does not bruise/bleed easily.  Psychiatric/Behavioral: Negative for agitation and dysphoric mood.       Objective:    Physical Exam Constitutional:      General: He is not in acute distress.    Appearance: Normal appearance. He is well-developed.  HENT:     Head: Normocephalic and atraumatic.  Eyes:     General: No scleral icterus.       Right eye: No discharge.        Left eye: No discharge.     Conjunctiva/sclera: Conjunctivae normal.  Neck:     Musculoskeletal: Neck supple. No muscular tenderness.     Thyroid: No thyromegaly.  Cardiovascular:     Rate and Rhythm: Normal rate and regular rhythm.  Pulmonary:     Effort: No respiratory distress.     Breath sounds: Normal breath sounds. No wheezing.  Abdominal:     General: Bowel sounds are normal.     Palpations: Abdomen is soft.     Tenderness: There is no abdominal tenderness.  Genitourinary:    Comments: Not performed.   Musculoskeletal:        General: No tenderness.  Lymphadenopathy:     Cervical: No cervical adenopathy.  Skin:    Findings: No erythema or rash.  Neurological:     Mental Status: He is alert and oriented to person, place, and time.  Psychiatric:        Mood and Affect: Mood normal.        Behavior: Behavior normal.     BP (!) 142/86    Pulse 64    Temp 98.8 F (37.1 C)    Ht 5\' 11"  (1.803 m)    Wt 193 lb 3.2 oz (87.6 kg)    SpO2 99%    BMI 26.95 kg/m  Wt Readings from Last 3 Encounters:  10/06/18 193 lb 3.2 oz (87.6 kg)    06/01/18 194 lb (88 kg)  05/08/18 196 lb 12.8 oz (89.3 kg)     Lab Results  Component Value Date   WBC 5.0 10/06/2018   HGB 12.9 (L) 10/06/2018   HCT 38.5 (L) 10/06/2018  PLT 139.0 (L) 10/06/2018   GLUCOSE 92 10/06/2018   CHOL 105 10/06/2018   TRIG 102.0 10/06/2018   HDL 36.30 (L) 10/06/2018   LDLCALC 48 10/06/2018   ALT 16 10/06/2018   AST 19 10/06/2018   NA 139 10/06/2018   K 4.8 10/06/2018   CL 104 10/06/2018   CREATININE 0.93 10/06/2018   BUN 24 (H) 10/06/2018   CO2 29 10/06/2018   TSH 2.70 06/26/2017   PSA 1.61 10/06/2018   INR 1.08 02/13/2016       Assessment & Plan:   Problem List Items Addressed This Visit    Anemia    Follow cbc.       Aneurysm artery, iliac common ([place])    Evaluated by [facility] 06/01/18 - stable.  Recommended f/u in one year.        CAD (coronary artery disease)    Previous hear catheterization revealed 2 vessel CAD.  Saw cardiology 12/2017.  Stable.  Recommended f/u in one year.  On crestor.  Continue risk factor modification.        Carotid stenosis    S/p CEA.  Followed by [facility].  Continue aggressive risk factor modification. Continue aspirin and plavix.        Essential hypertension, benign - Primary    Blood pressure on recheck improved.  Continue current medication regimen.  Follow pressures. Follow metabolic panel.        Relevant Orders   Basic metabolic panel (Completed)   Health care maintenance    Physical today 10/06/18.  Colonoscopy 08/12/13 - normal.        Hypercholesterolemia    On crestor.  Low cholesterol diet and exercise.  Follow lipid panel and liver function tests.        Relevant Orders   Hepatic function panel (Completed)   Lipid panel (Completed)   Thrombocytopenia (HCC)    Follow cbc.        Relevant Orders   CBC with Differential/Platelet (Completed)    Other Visit Diagnoses    Prostate cancer screening       Relevant Orders   PSA, Medicare (Completed)       Einar Pheasant, MD

## 2018-10-07 ENCOUNTER — Other Ambulatory Visit: Payer: Self-pay | Admitting: Internal Medicine

## 2018-10-07 ENCOUNTER — Other Ambulatory Visit (INDEPENDENT_AMBULATORY_CARE_PROVIDER_SITE_OTHER): Payer: Medicare HMO

## 2018-10-07 DIAGNOSIS — D649 Anemia, unspecified: Secondary | ICD-10-CM

## 2018-10-07 LAB — IBC + FERRITIN
Ferritin: 208.2 ng/mL (ref 22.0–322.0)
Iron: 90 ug/dL (ref 42–165)
Saturation Ratios: 26.5 % (ref 20.0–50.0)
Transferrin: 243 mg/dL (ref 212.0–360.0)

## 2018-10-07 LAB — VITAMIN B12: Vitamin B-12: 825 pg/mL (ref 211–911)

## 2018-10-07 NOTE — Progress Notes (Signed)
Order placed for add on labs.   °

## 2018-10-08 ENCOUNTER — Other Ambulatory Visit: Payer: Self-pay | Admitting: Internal Medicine

## 2018-10-08 DIAGNOSIS — D696 Thrombocytopenia, unspecified: Secondary | ICD-10-CM

## 2018-10-08 NOTE — Progress Notes (Signed)
Order placed for f/u cbc.   

## 2018-10-11 ENCOUNTER — Encounter: Payer: Self-pay | Admitting: Internal Medicine

## 2018-10-11 NOTE — Assessment & Plan Note (Signed)
Follow cbc.  

## 2018-10-11 NOTE — Assessment & Plan Note (Signed)
Evaluated by [facility] 06/01/18 - stable.  Recommended f/u in one year.

## 2018-10-11 NOTE — Assessment & Plan Note (Signed)
Blood pressure on recheck improved.  Continue current medication regimen.  Follow pressures.  Follow metabolic panel.   

## 2018-10-11 NOTE — Assessment & Plan Note (Signed)
S/p CEA.  Followed by [facility].  Continue aggressive risk factor modification. Continue aspirin and plavix.

## 2018-10-11 NOTE — Assessment & Plan Note (Signed)
Previous hear catheterization revealed 2 vessel CAD.  Saw cardiology 12/2017.  Stable.  Recommended f/u in one year.  On crestor.  Continue risk factor modification.

## 2018-10-11 NOTE — Assessment & Plan Note (Signed)
On crestor.  Low cholesterol diet and exercise.  Follow lipid panel and liver function tests.   

## 2018-12-24 DIAGNOSIS — R69 Illness, unspecified: Secondary | ICD-10-CM | POA: Diagnosis not present

## 2018-12-25 ENCOUNTER — Telehealth: Payer: Self-pay

## 2018-12-25 MED ORDER — ROSUVASTATIN CALCIUM 10 MG PO TABS: 10.0000 mg | ORAL_TABLET | Freq: Every day | ORAL | 0 refills | Status: DC

## 2018-12-25 NOTE — Telephone Encounter (Signed)
Requested Prescriptions   Signed Prescriptions Disp Refills  . rosuvastatin (CRESTOR) 10 MG tablet 30 tablet 0    Sig: Take 1 tablet (10 mg total) by mouth daily. *PLEASE CALL [id number] TO SCHEDULE APPOINTMENT FOR FURTHER REFILLS.*    Authorizing Provider: Theora Gianotti    Ordering User: Raelene Bott, Denetria Luevanos L

## 2018-12-31 ENCOUNTER — Other Ambulatory Visit: Payer: Self-pay

## 2019-01-04 ENCOUNTER — Other Ambulatory Visit (INDEPENDENT_AMBULATORY_CARE_PROVIDER_SITE_OTHER): Payer: Medicare HMO

## 2019-01-04 ENCOUNTER — Other Ambulatory Visit: Payer: Self-pay

## 2019-01-04 DIAGNOSIS — D696 Thrombocytopenia, unspecified: Secondary | ICD-10-CM

## 2019-01-04 DIAGNOSIS — D649 Anemia, unspecified: Secondary | ICD-10-CM

## 2019-01-04 LAB — CBC WITH DIFFERENTIAL/PLATELET
Basophils Absolute: 0 10*3/uL (ref 0.0–0.1)
Basophils Relative: 0.7 % (ref 0.0–3.0)
Eosinophils Absolute: 0.2 10*3/uL (ref 0.0–0.7)
Eosinophils Relative: 5.1 % — ABNORMAL HIGH (ref 0.0–5.0)
HCT: 39.7 % (ref 39.0–52.0)
Hemoglobin: 13.4 g/dL (ref 13.0–17.0)
Lymphocytes Relative: 33.3 % (ref 12.0–46.0)
Lymphs Abs: 1.5 10*3/uL (ref 0.7–4.0)
MCHC: 33.7 g/dL (ref 30.0–36.0)
MCV: 98.7 fl (ref 78.0–100.0)
Monocytes Absolute: 0.4 10*3/uL (ref 0.1–1.0)
Monocytes Relative: 8 % (ref 3.0–12.0)
Neutro Abs: 2.5 10*3/uL (ref 1.4–7.7)
Neutrophils Relative %: 52.9 % (ref 43.0–77.0)
Platelets: 149 10*3/uL — ABNORMAL LOW (ref 150.0–400.0)
RBC: 4.02 Mil/uL — ABNORMAL LOW (ref 4.22–5.81)
RDW: 13.6 % (ref 11.5–15.5)
WBC: 4.6 10*3/uL (ref 4.0–10.5)

## 2019-01-05 ENCOUNTER — Encounter: Payer: Self-pay | Admitting: *Deleted

## 2019-01-05 LAB — FOLATE RBC: RBC Folate: 842 ng/mL

## 2019-01-19 ENCOUNTER — Other Ambulatory Visit: Payer: Self-pay | Admitting: Internal Medicine

## 2019-01-19 ENCOUNTER — Other Ambulatory Visit: Payer: Self-pay

## 2019-01-19 ENCOUNTER — Encounter (INDEPENDENT_AMBULATORY_CARE_PROVIDER_SITE_OTHER): Payer: Self-pay | Admitting: Vascular Surgery

## 2019-01-19 ENCOUNTER — Ambulatory Visit (INDEPENDENT_AMBULATORY_CARE_PROVIDER_SITE_OTHER): Payer: Medicare HMO | Admitting: Vascular Surgery

## 2019-01-19 ENCOUNTER — Ambulatory Visit (INDEPENDENT_AMBULATORY_CARE_PROVIDER_SITE_OTHER): Payer: Medicare HMO

## 2019-01-19 VITALS — BP 139/71 | HR 50 | Resp 16 | Wt 196.2 lb

## 2019-01-19 DIAGNOSIS — I10 Essential (primary) hypertension: Secondary | ICD-10-CM

## 2019-01-19 DIAGNOSIS — I723 Aneurysm of iliac artery: Secondary | ICD-10-CM

## 2019-01-19 DIAGNOSIS — E7800 Pure hypercholesterolemia, unspecified: Secondary | ICD-10-CM | POA: Diagnosis not present

## 2019-01-19 DIAGNOSIS — I6523 Occlusion and stenosis of bilateral carotid arteries: Secondary | ICD-10-CM

## 2019-01-19 NOTE — Assessment & Plan Note (Signed)
Checked earlier this year.  To be checked annually.

## 2019-01-19 NOTE — Progress Notes (Signed)
MRN : [id number]  Brett Riley is a 83 y.o. (1935/05/12) male who presents with chief complaint of  Chief Complaint  Patient presents with  . Follow-up    ultrasound follow up  .  History of Present Illness: Patient returns in follow-up of his carotid disease.  He is doing well today without any specific complaints.  He does not have any focal neurologic symptoms.  Specifically, the patient denies amaurosis fugax, speech or swallowing difficulties, or arm or leg weakness or numbness.  He is status post left carotid endarterectomy previously and this is widely patent on duplex.  His right carotid artery stenosis remains in the 1 to 39% range.  Current Outpatient Medications  Medication Sig Dispense Refill  . aspirin 81 MG tablet Take 81 mg by mouth at bedtime.     Marland Kitchen lisinopril (ZESTRIL) 10 MG tablet TAKE 1 TABLET BY MOUTH EVERY DAY 90 tablet 1  . metoprolol succinate (TOPROL-XL) 100 MG 24 hr tablet TAKE 1 TABLET BY MOUTH EVERY DAY WITH A MEAL 90 tablet 2  . Multiple Vitamins-Iron (MULTIVITAMINS WITH IRON) TABS Take 1 tablet by mouth daily.    . nitroGLYCERIN (NITROSTAT) 0.4 MG SL tablet Place 1 tablet (0.4 mg total) under the tongue every 5 (five) minutes as needed. 25 tablet 6  . rosuvastatin (CRESTOR) 10 MG tablet Take 1 tablet (10 mg total) by mouth daily. *PLEASE CALL [id number] TO SCHEDULE APPOINTMENT FOR FURTHER REFILLS.* 30 tablet 0  . vitamin B-12 (CYANOCOBALAMIN) 1000 MCG tablet Take 1,000 mcg by mouth daily.    . clopidogrel (PLAVIX) 75 MG tablet TAKE 1 TABLET (75 MG TOTAL) BY MOUTH DAILY WITH BREAKFAST. 90 tablet 1   No current facility-administered medications for this visit.     Past Medical History:  Diagnosis Date  . Carotid arterial disease ([place])    a. 02/2016 L CEA; b. [id number] U/S: patent LICA, 123456 RICA.  Marland Kitchen Coronary artery disease    a. 01/2016 MV: mild apical/basal inferior, apical lateral, mid anterolateral, and mid inferolateral ischemia. EF 57%; b. 02/2016  Cath: LM 40ost, LAD 70p/m, 20d, D1 95 (small), LCX nl, RCA 24m, RPDA 90 (small), EF 55-65%-->med Rx. Rec CABG for recurrent symptoms.  . History of echocardiogram    a. 02/2016 Echo: EF 50-55%, no rwma, mild MR.  Marland Kitchen Hypercholesterolemia   . Hypertension     Past Surgical History:  Procedure Laterality Date  . CARDIAC CATHETERIZATION N/A 01/19/2016   Procedure: Left Heart Cath and Coronary Angiography;  Surgeon: Wellington Hampshire, MD;  Location: [place] [facility];  Service: Cardiovascular;  Laterality: N/A;  . ENDARTERECTOMY Left 02/15/2016   Procedure: ENDARTERECTOMY CAROTID;  Surgeon: Algernon Huxley, MD;  Location: [facility];  Service: Vascular;  Laterality: Left;  . NO PAST SURGERIES       Social History   Tobacco Use  . Smoking status: Never Smoker  . Smokeless tobacco: Never Used  Substance Use Topics  . Alcohol use: No    Alcohol/week: 0.0 standard drinks  . Drug use: No    Family History  Problem Relation Age of Onset  . Heart disease Father        MI  . Heart attack Father   . Stroke Mother   . Breast cancer Sister   . Colon cancer Neg Hx   . Prostate cancer Neg Hx     No Known Allergies  REVIEW OF SYSTEMS (Negative unless checked)  Constitutional: [] ?Weight loss  [] ?Fever  [] ?Chills  Cardiac: [] ?Chest pain   [] ?Chest pressure   [] ?Palpitations   [] ?Shortness of breath when laying flat   [] ?Shortness of breath at rest   [] ?Shortness of breath with exertion. Vascular:  [] ?Pain in legs with walking   [] ?Pain in legs at rest   [] ?Pain in legs when laying flat   [] ?Claudication   [] ?Pain in feet when walking  [] ?Pain in feet at rest  [] ?Pain in feet when laying flat   [] ?History of DVT   [] ?Phlebitis   [] ?Swelling in legs   [] ?Varicose veins   [] ?Non-healing ulcers Pulmonary:   [] ?Uses home oxygen   [] ?Productive cough   [] ?Hemoptysis   [] ?Wheeze  [] ?COPD   [] ?Asthma Neurologic:  [x] ?Dizziness  [] ?Blackouts   [] ?Seizures   [] ?History of stroke   [] ?History of TIA   [] ?Aphasia   [] ?Temporary blindness   [] ?Dysphagia   [] ?Weakness or numbness in arms   [] ?Weakness or numbness in legs Musculoskeletal:  [x] ?Arthritis   [] ?Joint swelling   [] ?Joint pain   [] ?Low back pain Hematologic:  [] ?Easy bruising  [] ?Easy bleeding   [] ?Hypercoagulable state   [] ?Anemic  [] ?Hepatitis Gastrointestinal:  [] ?Blood in stool   [] ?Vomiting blood  [] ?Gastroesophageal reflux/heartburn   [] ?Difficulty swallowing. Genitourinary:  [] ?Chronic kidney disease   [] ?Difficult urination  [] ?Frequent urination  [] ?Burning with urination   [] ?Blood in urine Skin:  [] ?Rashes   [] ?Ulcers   [] ?Wounds Psychological:  [] ?History of anxiety   [] ? History of major depression.  Physical Examination  Vitals:   01/19/19 0955  BP: 139/71  Pulse: (!) 50  Resp: 16  Weight: 196 lb 3.2 oz (89 kg)   Body mass index is 27.36 kg/m. Gen:  WD/WN, NAD Head: [place]/AT, No temporalis wasting. Ear/Nose/Throat: Hearing grossly intact, nares w/o erythema or drainage, trachea midline Eyes: Conjunctiva clear. Sclera non-icteric Neck: Supple.  No bruit  Pulmonary:  Good air movement, equal and clear to auscultation bilaterally.  Cardiac: RRR, No JVD Vascular:  Vessel Right Left  Radial Palpable Palpable           Musculoskeletal: M/S 5/5 throughout.  No deformity or atrophy. No edema. Neurologic: CN 2-12 intact. Sensation grossly intact in extremities.  Symmetrical.  Speech is fluent. Motor exam as listed above. Psychiatric: Judgment intact, Mood & affect appropriate for pt's clinical situation. Dermatologic: No rashes or ulcers noted.  No cellulitis or open wounds.      CBC Lab Results  Component Value Date   WBC 4.6 01/04/2019   HGB 13.4 01/04/2019   HCT 39.7 01/04/2019   MCV 98.7 01/04/2019   PLT 149.0 (L) 01/04/2019    BMET    Component Value Date/Time   NA 139 10/06/2018 1205   K 4.8 10/06/2018 1205   CL 104 10/06/2018 1205   CO2 29 10/06/2018 1205   GLUCOSE 92 10/06/2018 1205    BUN 24 (H) 10/06/2018 1205   CREATININE 0.93 10/06/2018 1205   CALCIUM 9.9 10/06/2018 1205   GFRNONAA >60 02/16/2016 0439   GFRAA >60 02/16/2016 0439   CrCl cannot be calculated (Patient's most recent lab result is older than the maximum 21 days allowed.).  COAG Lab Results  Component Value Date   INR 1.08 02/13/2016   INR 0.97 01/18/2016    Radiology No results found.    Assessment/Plan Essential hypertension, benign blood pressure control important in reducing the progression of atherosclerotic disease. On appropriate oral medications.   Hypercholesterolemia lipid control important in reducing the progression of atherosclerotic disease. Continue statin therapy  Aneurysm  artery, iliac common ([place]) Checked earlier this year.  To be checked annually.  Carotid stenosis He is status post left carotid endarterectomy previously and this is widely patent on duplex.  His right carotid artery stenosis remains in the 1 to 39% range. We will continue following this roughly annually.  I am going to delay his next study for a few months to try to get him on schedule to be able to get his carotids and his small iliac aneurysms monitored at the same time to save him a visit.  No change in his medical regimen.  Contact our office with any problems in the interim.    Leotis Pain, MD  01/19/2019 10:34 AM    This note was created with Dragon medical transcription system.  Any errors from dictation are purely unintentional

## 2019-01-19 NOTE — Assessment & Plan Note (Signed)
He is status post left carotid endarterectomy previously and this is widely patent on duplex.  His right carotid artery stenosis remains in the 1 to 39% range. We will continue following this roughly annually.  I am going to delay his next study for a few months to try to get him on schedule to be able to get his carotids and his small iliac aneurysms monitored at the same time to save him a visit.  No change in his medical regimen.  Contact our office with any problems in the interim.

## 2019-02-09 ENCOUNTER — Other Ambulatory Visit: Payer: Self-pay | Admitting: *Deleted

## 2019-02-09 MED ORDER — ROSUVASTATIN CALCIUM 10 MG PO TABS: 10.0000 mg | ORAL_TABLET | Freq: Every day | ORAL | 0 refills | Status: DC

## 2019-03-10 ENCOUNTER — Other Ambulatory Visit: Payer: Self-pay | Admitting: Internal Medicine

## 2019-03-10 ENCOUNTER — Other Ambulatory Visit: Payer: Self-pay

## 2019-03-10 MED ORDER — ROSUVASTATIN CALCIUM 10 MG PO TABS: 10.0000 mg | ORAL_TABLET | Freq: Every day | ORAL | 0 refills | Status: DC

## 2019-03-22 NOTE — Progress Notes (Signed)
BMET    Office Visit    Patient Name: Brett Riley Date of Encounter: 03/24/2019  Primary Care Provider:  Einar Pheasant, MD Primary Cardiologist:  Kathlyn Sacramento, MD Electrophysiologist:  None   Chief Complaint    Brett Riley is a 84 y.o. male with a hx of CAD, HTN, HLD, carotid arterial disease s/p left carotid endarterectomy presents today for follow-up of CAD.  Past Medical History    Past Medical History:  Diagnosis Date  . Carotid arterial disease ([place])    a. 02/2016 L CEA; b. [id number] U/S: patent LICA, [id number] RICA.  Marland Kitchen Coronary artery disease    a. 01/2016 MV: mild apical/basal inferior, apical lateral, mid anterolateral, and mid inferolateral ischemia. EF 57%; b. 02/2016 Cath: LM 40ost, LAD 70p/m, 20d, D1 95 (small), LCX nl, RCA 63m, RPDA 90 (small), EF 55-65%-->med Rx. Rec CABG for recurrent symptoms.  . History of echocardiogram    a. 02/2016 Echo: EF 50-55%, no rwma, mild MR.  Marland Kitchen Hypercholesterolemia   . Hypertension    Past Surgical History:  Procedure Laterality Date  . CARDIAC CATHETERIZATION N/A 01/19/2016   Procedure: Left Heart Cath and Coronary Angiography;  Surgeon: Wellington Hampshire, MD;  Location: [place] [facility];  Service: Cardiovascular;  Laterality: N/A;  . ENDARTERECTOMY Left 02/15/2016   Procedure: ENDARTERECTOMY CAROTID;  Surgeon: Algernon Huxley, MD;  Location: [facility];  Service: Vascular;  Laterality: Left;   Allergies  No Known Allergies  History of Present Illness    Brett Riley is a 84 y.o. male with a hx of CAD, HTN, HLD, carotid artery disease s/p left carotid endarterectomy.  He was last seen 12/11/2017 by Ignacia Bayley, NP. Previously followed with Dr. Yvone Neu.   Initially evaluated by Dr. Yvone Neu 01/2018 in the setting of preoperative elevation for a left CEA.  At that time he noted DOE, stress testing with abnormal.  Followed by diagnostic catheterization in 02/2016 showing moderate, 40% ostial left main stenosis as well as 70%  proximal and mid LAD disease.  He had significant diagonal and RPDA disease however both of these vessels were small and not felt to be amenable for PCI.  As a result medical therapy was recommended.  Subsequently underwent left CEA which was successful and has not had complications.  He follows with Dr. Lucky Cowboy of vascular service for carotid stenosis and common iliac aneurysm. Carotid duplex 01/19/19 left ICA widely patent post CEA and R ICA 1-30% stenosis. AAA Duplex 05/2018 with right CIA mildly dilated at 1.44cm and mid L ICA with moderate tortuosity and max dimension of 1.69cmx1.75cm. Both recommended to follow annually.   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 Stays active in his yard. Denies chest pain, pressure, tightness. Reports no SOB nor DOE.   Endorses eating a heart healthy diet. Eats no red meat, only chicken. Does not salt his food.   Has not been checking BP at home though he does have a cuff. His BP initial was SBP 180. On recheck by second CMA 170/100. Was then BP 160/98 on my check during office visit. Evelina Bucy his meds at 6:30 this AM. Appointment time 10:30AM.   EKGs/Labs/Other Studies Reviewed:   The following studies were reviewed today:  EKG:  EKG is ordered today.  The ekg ordered today demonstrates SR 72 bpm with no acute ST/T wave changes.  Recent Labs: 10/06/2018: ALT 16; BUN 24; Creatinine, Ser 0.93; Potassium  4.8; Sodium 139 01/04/2019: Hemoglobin 13.4; Platelets 149.0  Recent Lipid Panel    Component Value Date/Time   CHOL 105 10/06/2018 1205   TRIG 102.0 10/06/2018 1205   HDL 36.30 (L) 10/06/2018 1205   CHOLHDL 3 10/06/2018 1205   VLDL 20.4 10/06/2018 1205   LDLCALC 48 10/06/2018 1205    Home Medications   Current Meds  Medication Sig  . aspirin 81 MG tablet Take 81 mg by mouth at bedtime.   . clopidogrel (PLAVIX) 75 MG tablet TAKE 1 TABLET (75 MG TOTAL) BY MOUTH DAILY WITH BREAKFAST.  Marland Kitchen lisinopril (ZESTRIL) 10  MG tablet TAKE 1 TABLET BY MOUTH EVERY DAY  . metoprolol succinate (TOPROL-XL) 100 MG 24 hr tablet TAKE 1 TABLET BY MOUTH EVERY DAY WITH A MEAL  . Multiple Vitamins-Iron (MULTIVITAMINS WITH IRON) TABS Take 1 tablet by mouth daily.  . nitroGLYCERIN (NITROSTAT) 0.4 MG SL tablet Place 1 tablet (0.4 mg total) under the tongue every 5 (five) minutes as needed.  . rosuvastatin (CRESTOR) 10 MG tablet Take 1 tablet (10 mg total) by mouth daily. *PLEASE CALL [id number] TO SCHEDULE APPOINTMENT FOR FURTHER REFILLS.*  . vitamin B-12 (CYANOCOBALAMIN) 1000 MCG tablet Take 1,000 mcg by mouth daily.    Review of Systems      Review of Systems  Constitution: Negative for chills, fever and malaise/fatigue.  Cardiovascular: Negative for chest pain, dyspnea on exertion, leg swelling, near-syncope, orthopnea, palpitations and syncope.  Respiratory: Negative for cough, shortness of breath and wheezing.   Gastrointestinal: Negative for nausea and vomiting.  Neurological: Negative for dizziness, light-headedness and weakness.   All other systems reviewed and are otherwise negative except as noted above.  Physical Exam    VS:  BP (!) 170/100 (BP Location: Left Arm, Patient Position: Sitting, Cuff Size: Normal)   Pulse 72   Ht 6' (1.829 m)   Wt 200 lb 4 oz (90.8 kg)   SpO2 99%   BMI 27.16 kg/m  , BMI Body mass index is 27.16 kg/m. GEN: Well nourished, well developed, in no acute distress. HEENT: normal. Neck: Supple, no JVD, carotid bruits, or masses. Cardiac: RRR, no murmurs, rubs, or gallops. No clubbing, cyanosis, edema.  Radials/DP/PT 2+ and equal bilaterally.  Respiratory:  Respirations regular and unlabored, clear to auscultation bilaterally. GI: Soft, nontender, nondistended. MS: No deformity or atrophy. Skin: Warm and dry, no rash. Neuro:  Strength and sensation are intact. Psych: Normal affect.  Accessory Clinical Findings    ECG personally reviewed by me today - SR 72 bpm with no acute  ST/T wave changes - no acute changes.  Assessment & Plan    1. CAD - Stable with no anginal symptoms. No indication for ischemic evaluation at this time. GDMT includes aspirin, plavix, beta blocker, and statin. Continue low sodium heart healthy diet. Continue regular cardiovascular exercise.   2. HTN - BP above goal at this office visit and recent office visit at outside providers 01/2019 and 10/2018. Increase Lisinopril from 10mg  daily to 20mg  daily. BMET today. He is agreeable to check BP at home after this change and will report BP consistently >130/80.  3. HLD, LDL goal <70 - Lipid panel 10/06/18 LDL 48, at goal of <70. Continue Crestor 10mg  daily.   4. Carotid artery disease s/p L carotid endarterectomy 2017/common iliac artery aneurysm - Follows with Dr. Lucky Cowboy of [facility]. Recent study stable. Recommended for repeat study in one year.  Disposition: Follow up in 1 year(s) with Dr. Fletcher Anon.   [employer]  Thomes Dinning, NP 03/24/2019, 10:22 AM

## 2019-03-24 ENCOUNTER — Encounter: Payer: Self-pay | Admitting: Family

## 2019-03-24 ENCOUNTER — Telehealth: Payer: Self-pay | Admitting: Internal Medicine

## 2019-03-24 ENCOUNTER — Other Ambulatory Visit: Payer: Self-pay

## 2019-03-24 ENCOUNTER — Ambulatory Visit (INDEPENDENT_AMBULATORY_CARE_PROVIDER_SITE_OTHER): Payer: Medicare HMO | Admitting: Family

## 2019-03-24 VITALS — BP 170/100 | HR 72 | Ht 72.0 in | Wt 200.2 lb

## 2019-03-24 DIAGNOSIS — E785 Hyperlipidemia, unspecified: Secondary | ICD-10-CM | POA: Diagnosis not present

## 2019-03-24 DIAGNOSIS — I6523 Occlusion and stenosis of bilateral carotid arteries: Secondary | ICD-10-CM | POA: Diagnosis not present

## 2019-03-24 DIAGNOSIS — I10 Essential (primary) hypertension: Secondary | ICD-10-CM | POA: Diagnosis not present

## 2019-03-24 DIAGNOSIS — I2510 Atherosclerotic heart disease of native coronary artery without angina pectoris: Secondary | ICD-10-CM

## 2019-03-24 DIAGNOSIS — I723 Aneurysm of iliac artery: Secondary | ICD-10-CM | POA: Diagnosis not present

## 2019-03-24 MED ORDER — LISINOPRIL 20 MG PO TABS: 20.0000 mg | ORAL_TABLET | Freq: Every day | ORAL | 3 refills | Status: DC

## 2019-03-24 NOTE — Telephone Encounter (Signed)
Pt needs refill on rosuvastatin (CRESTOR) 10 MG tablet [facility] [place]

## 2019-03-24 NOTE — Patient Instructions (Addendum)
Medication Instructions:  Your physician has recommended you make the following change in your medication:   CHANGE your Lisinopril to 20mg  daily You may use up your 10mg  tablets by taking 2 until they are gone.  *If you need a refill on your cardiac medications before your next appointment, please call your pharmacy*  Lab Work: Your physician recommends that you return for lab work today: BMET  If you have labs (blood work) drawn today and your tests are completely normal, you will receive your results only by: Marland Kitchen MyChart Message (if you have MyChart) OR . A paper copy in the mail If you have any lab test that is abnormal or we need to change your treatment, we will call you to review the results.  Testing/Procedures: You had an EKG today. It showed normal sinus rhythm which is a good result.   Follow-Up: At [facility], you and your health needs are our priority.  As part of our continuing mission to provide you with exceptional heart care, we have created designated Provider Care Teams.  These Care Teams include your primary Cardiologist (physician) and Advanced Practice Providers (APPs -  Physician Assistants and Nurse Practitioners) who all work together to provide you with the care you need, when you need it.  Your next appointment:   1 year(s)  The format for your next appointment:   In Person  Provider:   Kathlyn Sacramento, MD  Other Instructions  Check your blood pressure a few times per week and keep a log. If your blood pressure is consistently more than 130/80 please call our office or Dr. Nicki Reaper.

## 2019-03-25 ENCOUNTER — Telehealth: Payer: Self-pay

## 2019-03-25 LAB — BASIC METABOLIC PANEL WITH GFR
BUN/Creatinine Ratio: 18 (ref 10–24)
BUN: 18 mg/dL (ref 8–27)
CO2: 24 mmol/L (ref 20–29)
Calcium: 10 mg/dL (ref 8.6–10.2)
Chloride: 103 mmol/L (ref 96–106)
Creatinine, Ser: 0.98 mg/dL (ref 0.76–1.27)
GFR calc Af Amer: 83 mL/min/{1.73_m2}
GFR calc non Af Amer: 72 mL/min/{1.73_m2}
Glucose: 90 mg/dL (ref 65–99)
Potassium: 4.6 mmol/L (ref 3.5–5.2)
Sodium: 141 mmol/L (ref 134–144)

## 2019-03-25 NOTE — Telephone Encounter (Signed)
-----   Message from Loel Dubonnet, NP sent at 03/25/2019  7:41 AM EST ----- Metabolic panel shows normal kidney function and normal electrolytes. Good result!

## 2019-03-25 NOTE — Telephone Encounter (Signed)
Call to patient to discuss lab results. Pt verbalized understanding and no further orders needed at this time.   Advised pt to call for any further questions or concerns.

## 2019-03-26 NOTE — Telephone Encounter (Signed)
Advised that this is filled by cardiology. Pts wife will call them for refill.

## 2019-03-30 ENCOUNTER — Other Ambulatory Visit: Payer: Self-pay | Admitting: Family

## 2019-03-30 MED ORDER — ROSUVASTATIN CALCIUM 10 MG PO TABS: 10.0000 mg | ORAL_TABLET | Freq: Every day | ORAL | 3 refills | Status: DC

## 2019-03-30 NOTE — Telephone Encounter (Signed)
*  STAT* If patient is at the pharmacy, call can be transferred to refill team.   1. Which medications need to be refilled? (please list name of each medication and dose if known) Rosuvastatin 10 mg  2. Which pharmacy/location (including street and city if local pharmacy) is medication to be sent to[facility] [place] [facility]. Do they need a 30 day or 90 day supply? [place]

## 2019-03-30 NOTE — Telephone Encounter (Signed)
Refill sent for Crestor 10 mg

## 2019-05-10 ENCOUNTER — Other Ambulatory Visit: Payer: Self-pay

## 2019-05-10 ENCOUNTER — Encounter: Payer: Self-pay | Admitting: Internal Medicine

## 2019-05-10 ENCOUNTER — Ambulatory Visit (INDEPENDENT_AMBULATORY_CARE_PROVIDER_SITE_OTHER): Payer: Medicare HMO | Admitting: Internal Medicine

## 2019-05-10 ENCOUNTER — Ambulatory Visit: Payer: Medicare HMO

## 2019-05-10 VITALS — BP 130/78 | HR 75 | Temp 97.5°F | Resp 16 | Wt 197.0 lb

## 2019-05-10 DIAGNOSIS — I723 Aneurysm of iliac artery: Secondary | ICD-10-CM

## 2019-05-10 DIAGNOSIS — D696 Thrombocytopenia, unspecified: Secondary | ICD-10-CM

## 2019-05-10 DIAGNOSIS — E7800 Pure hypercholesterolemia, unspecified: Secondary | ICD-10-CM | POA: Diagnosis not present

## 2019-05-10 DIAGNOSIS — D649 Anemia, unspecified: Secondary | ICD-10-CM | POA: Diagnosis not present

## 2019-05-10 DIAGNOSIS — I6523 Occlusion and stenosis of bilateral carotid arteries: Secondary | ICD-10-CM

## 2019-05-10 DIAGNOSIS — I10 Essential (primary) hypertension: Secondary | ICD-10-CM | POA: Diagnosis not present

## 2019-05-10 DIAGNOSIS — I2510 Atherosclerotic heart disease of native coronary artery without angina pectoris: Secondary | ICD-10-CM

## 2019-05-10 LAB — HEPATIC FUNCTION PANEL
ALT: 22 U/L (ref 0–53)
AST: 23 U/L (ref 0–37)
Albumin: 4.6 g/dL (ref 3.5–5.2)
Alkaline Phosphatase: 77 U/L (ref 39–117)
Bilirubin, Direct: 0.2 mg/dL (ref 0.0–0.3)
Total Bilirubin: 0.8 mg/dL (ref 0.2–1.2)
Total Protein: 7.3 g/dL (ref 6.0–8.3)

## 2019-05-10 LAB — CBC WITH DIFFERENTIAL/PLATELET
Basophils Absolute: 0 10*3/uL (ref 0.0–0.1)
Basophils Relative: 0.6 % (ref 0.0–3.0)
Eosinophils Absolute: 0.2 10*3/uL (ref 0.0–0.7)
Eosinophils Relative: 3.5 % (ref 0.0–5.0)
HCT: 41.9 % (ref 39.0–52.0)
Hemoglobin: 14.2 g/dL (ref 13.0–17.0)
Lymphocytes Relative: 34.9 % (ref 12.0–46.0)
Lymphs Abs: 1.8 10*3/uL (ref 0.7–4.0)
MCHC: 33.9 g/dL (ref 30.0–36.0)
MCV: 97.3 fl (ref 78.0–100.0)
Monocytes Absolute: 0.4 10*3/uL (ref 0.1–1.0)
Monocytes Relative: 8.1 % (ref 3.0–12.0)
Neutro Abs: 2.7 10*3/uL (ref 1.4–7.7)
Neutrophils Relative %: 52.9 % (ref 43.0–77.0)
Platelets: 139 10*3/uL — ABNORMAL LOW (ref 150.0–400.0)
RBC: 4.31 Mil/uL (ref 4.22–5.81)
RDW: 13.2 % (ref 11.5–15.5)
WBC: 5.2 10*3/uL (ref 4.0–10.5)

## 2019-05-10 LAB — BASIC METABOLIC PANEL WITH GFR
BUN: 24 mg/dL — ABNORMAL HIGH (ref 6–23)
CO2: 28 meq/L (ref 19–32)
Calcium: 9.9 mg/dL (ref 8.4–10.5)
Chloride: 104 meq/L (ref 96–112)
Creatinine, Ser: 1.1 mg/dL (ref 0.40–1.50)
GFR: 64.19 mL/min
Glucose, Bld: 101 mg/dL — ABNORMAL HIGH (ref 70–99)
Potassium: 4.7 meq/L (ref 3.5–5.1)
Sodium: 139 meq/L (ref 135–145)

## 2019-05-10 LAB — LIPID PANEL
Cholesterol: 116 mg/dL (ref 0–200)
HDL: 40.3 mg/dL
LDL Cholesterol: 57 mg/dL (ref 0–99)
NonHDL: 75.33
Total CHOL/HDL Ratio: 3
Triglycerides: 91 mg/dL (ref 0.0–149.0)
VLDL: 18.2 mg/dL (ref 0.0–40.0)

## 2019-05-10 LAB — TSH: TSH: 2.88 u[IU]/mL (ref 0.35–4.50)

## 2019-05-10 NOTE — Progress Notes (Signed)
Patient ID: Brett Riley, male   DOB: September 02, 1935, 84 y.o.   MRN: [id number]   Subjective:    Patient ID: Brett Riley, male    DOB: 1935/10/29, 84 y.o.   MRN: [id number]  HPI This visit occurred during the SARS-CoV-2 public health emergency.  Safety protocols were in place, including screening questions prior to the visit, additional usage of staff PPE, and extensive cleaning of exam room while observing appropriate contact time as indicated for disinfecting solutions.  Patient here for a scheduled follow up.  (was on the schedule as a telephone visit, but came into the office for visit).  He reports he is doing well.  Feels good.  Working.  Stays active.  No chest pain or sob.  No acid reflux.  No abdominal pain or cramping.  Bowels moving.  Blood pressure last night 118/68.  No light headedness or dizziness.  Has a good appetite. Increased stress at work.  Overall handling things well.  Saw cardiology 03/2019.  Felt stable.  Recommended f/u in one year.  Lisinopril was increased from 10mg  to 20mg  q day.  Saw vascular surgery.  Stable.  Recommended f/u in one year.  Colonoscopy normal 2015.     Past Medical History:  Diagnosis Date  . Carotid arterial disease ([place])    a. 02/2016 L CEA; b. [id number] U/S: patent LICA, 123456 RICA.  Marland Kitchen Coronary artery disease    a. 01/2016 MV: mild apical/basal inferior, apical lateral, mid anterolateral, and mid inferolateral ischemia. EF 57%; b. 02/2016 Cath: LM 40ost, LAD 70p/m, 20d, D1 95 (small), LCX nl, RCA 5m, RPDA 90 (small), EF 55-65%-->med Rx. Rec CABG for recurrent symptoms.  . History of echocardiogram    a. 02/2016 Echo: EF 50-55%, no rwma, mild MR.  Marland Kitchen Hypercholesterolemia   . Hypertension    Past Surgical History:  Procedure Laterality Date  . CARDIAC CATHETERIZATION N/A 01/19/2016   Procedure: Left Heart Cath and Coronary Angiography;  Surgeon: Wellington Hampshire, MD;  Location: [place] [facility];  Service: Cardiovascular;  Laterality: N/A;  .  ENDARTERECTOMY Left 02/15/2016   Procedure: ENDARTERECTOMY CAROTID;  Surgeon: Algernon Huxley, MD;  Location: [facility];  Service: Vascular;  Laterality: Left;   Family History  Problem Relation Age of Onset  . Heart disease Father        MI  . Heart attack Father   . Stroke Mother   . Breast cancer Sister   . Colon cancer Neg Hx   . Prostate cancer Neg Hx    Social History   Socioeconomic History  . Marital status: Married    Spouse name: Not on file  . Number of children: 1  . Years of education: Not on file  . Highest education level: Not on file  Occupational History    Employer: [employer]  Tobacco Use  . Smoking status: Never Smoker  . Smokeless tobacco: Never Used  Substance and Sexual Activity  . Alcohol use: No    Alcohol/week: 0.0 standard drinks  . Drug use: No  . Sexual activity: Not Currently  Other Topics Concern  . Not on file  Social History Narrative  . Not on file   Social Determinants of Health   Financial Resource Strain:   . Difficulty of Paying Living Expenses:   Food Insecurity:   . Worried About Charity fundraiser in the Last Year:   . Arboriculturist in the Last Year:   Transportation Needs:   .  Lack of Transportation (Medical):   Marland Kitchen Lack of Transportation (Non-Medical):   Physical Activity:   . Days of Exercise per Week:   . Minutes of Exercise per Session:   Stress:   . Feeling of Stress :   Social Connections:   . Frequency of Communication with Friends and Family:   . Frequency of Social Gatherings with Friends and Family:   . Attends Religious Services:   . Active Member of Clubs or Organizations:   . Attends Archivist Meetings:   Marland Kitchen Marital Status:     Outpatient Encounter Medications as of 05/10/2019  Medication Sig  . aspirin 81 MG tablet Take 81 mg by mouth at bedtime.   . clopidogrel (PLAVIX) 75 MG tablet TAKE 1 TABLET (75 MG TOTAL) BY MOUTH DAILY WITH BREAKFAST.  Marland Kitchen lisinopril (ZESTRIL) 20 MG tablet Take 1 tablet  (20 mg total) by mouth daily.  . metoprolol succinate (TOPROL-XL) 100 MG 24 hr tablet TAKE 1 TABLET BY MOUTH EVERY DAY WITH A MEAL  . Multiple Vitamins-Iron (MULTIVITAMINS WITH IRON) TABS Take 1 tablet by mouth daily.  . nitroGLYCERIN (NITROSTAT) 0.4 MG SL tablet Place 1 tablet (0.4 mg total) under the tongue every 5 (five) minutes as needed.  . rosuvastatin (CRESTOR) 10 MG tablet Take 1 tablet (10 mg total) by mouth daily.  . vitamin B-12 (CYANOCOBALAMIN) 1000 MCG tablet Take 1,000 mcg by mouth daily.   No facility-administered encounter medications on file as of 05/10/2019.    Review of Systems  Constitutional: Negative for appetite change and unexpected weight change.  HENT: Negative for congestion and sinus pressure.   Eyes: Negative for pain and visual disturbance.  Respiratory: Negative for cough, chest tightness and shortness of breath.   Cardiovascular: Negative for chest pain, palpitations and leg swelling.  Gastrointestinal: Negative for abdominal pain, diarrhea, nausea and vomiting.  Genitourinary: Negative for difficulty urinating and dysuria.  Musculoskeletal: Negative for joint swelling and myalgias.  Skin: Negative for color change and rash.  Neurological: Negative for dizziness, light-headedness and headaches.  Hematological: Negative for adenopathy. Does not bruise/bleed easily.  Psychiatric/Behavioral: Negative for agitation and dysphoric mood.       Objective:    Physical Exam Constitutional:      General: He is not in acute distress.    Appearance: Normal appearance. He is well-developed.  HENT:     Head: Normocephalic and atraumatic.     Right Ear: External ear normal.     Left Ear: External ear normal.  Eyes:     General: No scleral icterus.       Right eye: No discharge.        Left eye: No discharge.     Conjunctiva/sclera: Conjunctivae normal.  Cardiovascular:     Rate and Rhythm: Normal rate and regular rhythm.  Pulmonary:     Effort: Pulmonary  effort is normal. No respiratory distress.     Breath sounds: Normal breath sounds.  Abdominal:     General: Bowel sounds are normal.     Palpations: Abdomen is soft.     Tenderness: There is no abdominal tenderness.  Musculoskeletal:        General: No swelling or tenderness.     Cervical back: Neck supple. No tenderness.  Lymphadenopathy:     Cervical: No cervical adenopathy.  Skin:    Findings: No erythema or rash.  Neurological:     Mental Status: He is alert.  Psychiatric:        Mood  and Affect: Mood normal.        Behavior: Behavior normal.     BP 130/78   Pulse 75   Temp (!) 97.5 F (36.4 C)   Resp 16   Wt 197 lb (89.4 kg)   SpO2 99%   BMI 26.72 kg/m  Wt Readings from Last 3 Encounters:  05/10/19 197 lb (89.4 kg)  03/24/19 200 lb 4 oz (90.8 kg)  01/19/19 196 lb 3.2 oz (89 kg)     Lab Results  Component Value Date   WBC 5.2 05/10/2019   HGB 14.2 05/10/2019   HCT 41.9 05/10/2019   PLT 139.0 (L) 05/10/2019   GLUCOSE 101 (H) 05/10/2019   CHOL 116 05/10/2019   TRIG 91.0 05/10/2019   HDL 40.30 05/10/2019   LDLCALC 57 05/10/2019   ALT 22 05/10/2019   AST 23 05/10/2019   NA 139 05/10/2019   K 4.7 05/10/2019   CL 104 05/10/2019   CREATININE 1.10 05/10/2019   BUN 24 (H) 05/10/2019   CO2 28 05/10/2019   TSH 2.88 05/10/2019   PSA 1.61 10/06/2018   INR 1.08 02/13/2016       Assessment & Plan:   Problem List Items Addressed This Visit    Anemia    Colonoscopy 08/2013 - normal.  Recommended f/u colonoscopy in 10 years.        Aneurysm artery, iliac common [facility])    Vascular surgery following.  Recommended f/u in one year.       CAD (coronary artery disease)    Previous cath revealed 2 vessel CAD.  On crestor.  Currently without symptoms.  Continue risk factor modification.        Carotid stenosis    S/p left carotid endarterectomy - widely patent on duplex.  RCA stenosis - remains in the 1-39% range.  Stable.  Continue risk factor modification.   Continue annual f/u with [facility].       Essential hypertension, benign    Blood pressure as outlined.  On lisinopril and metoprolol.  Follow pressures.  Follow metabolic panel.       Relevant Orders   TSH (Completed)   Basic metabolic panel (Completed)   Hypercholesterolemia    On crestor.  Low cholesterol diet and exercise.  Follow lipid panel and liver function tests.        Relevant Orders   Hepatic function panel (Completed)   Lipid panel (Completed)   Thrombocytopenia ([facility]) - Primary    Platelet count 149 on last check.  Recheck today.        Relevant Orders   CBC with Differential/Platelet (Completed)       Einar Pheasant, MD

## 2019-05-10 NOTE — Assessment & Plan Note (Signed)
Vascular surgery following.  Recommended f/u in one year.

## 2019-05-10 NOTE — Assessment & Plan Note (Signed)
Platelet count 149 on last check.  Recheck today.

## 2019-05-11 ENCOUNTER — Telehealth: Payer: Self-pay | Admitting: Internal Medicine

## 2019-05-11 NOTE — Telephone Encounter (Signed)
Pt needs a refill on lisinopril (ZESTRIL) [facility]  Pt got his covid vaccine on 03/22/19 and 04/12/19

## 2019-05-11 NOTE — Telephone Encounter (Signed)
Medication was already  refilled for a year by another provider. COVID vaccines entered to chart

## 2019-05-11 NOTE — Telephone Encounter (Signed)
Ok to refill 

## 2019-05-16 ENCOUNTER — Encounter: Payer: Self-pay | Admitting: Internal Medicine

## 2019-05-16 NOTE — Assessment & Plan Note (Signed)
S/p left carotid endarterectomy - widely patent on duplex.  RCA stenosis - remains in the 1-39% range.  Stable.  Continue risk factor modification.  Continue annual f/u with [facility].

## 2019-05-16 NOTE — Assessment & Plan Note (Signed)
On crestor.  Low cholesterol diet and exercise.  Follow lipid panel and liver function tests.   

## 2019-05-16 NOTE — Assessment & Plan Note (Signed)
Previous cath revealed 2 vessel CAD.  On crestor.  Currently without symptoms.  Continue risk factor modification.

## 2019-05-16 NOTE — Assessment & Plan Note (Signed)
Blood pressure as outlined.  On lisinopril and metoprolol.  Follow pressures.  Follow metabolic panel.

## 2019-05-16 NOTE — Assessment & Plan Note (Signed)
Colonoscopy 08/2013 - normal.  Recommended f/u colonoscopy in 10 years.

## 2019-05-19 ENCOUNTER — Encounter (INDEPENDENT_AMBULATORY_CARE_PROVIDER_SITE_OTHER): Payer: Medicare HMO

## 2019-05-24 ENCOUNTER — Other Ambulatory Visit: Payer: Self-pay | Admitting: Internal Medicine

## 2019-06-01 ENCOUNTER — Encounter (INDEPENDENT_AMBULATORY_CARE_PROVIDER_SITE_OTHER): Payer: Self-pay | Admitting: Vascular Surgery

## 2019-06-01 ENCOUNTER — Other Ambulatory Visit: Payer: Self-pay

## 2019-06-01 ENCOUNTER — Ambulatory Visit (INDEPENDENT_AMBULATORY_CARE_PROVIDER_SITE_OTHER): Payer: Medicare HMO | Admitting: Vascular Surgery

## 2019-06-01 ENCOUNTER — Ambulatory Visit (INDEPENDENT_AMBULATORY_CARE_PROVIDER_SITE_OTHER): Payer: Medicare HMO

## 2019-06-01 VITALS — BP 168/84 | HR 58 | Ht 72.0 in | Wt 200.0 lb

## 2019-06-01 DIAGNOSIS — I6523 Occlusion and stenosis of bilateral carotid arteries: Secondary | ICD-10-CM

## 2019-06-01 DIAGNOSIS — E7800 Pure hypercholesterolemia, unspecified: Secondary | ICD-10-CM | POA: Diagnosis not present

## 2019-06-01 DIAGNOSIS — I723 Aneurysm of iliac artery: Secondary | ICD-10-CM

## 2019-06-01 DIAGNOSIS — I10 Essential (primary) hypertension: Secondary | ICD-10-CM | POA: Insufficient documentation

## 2019-06-01 NOTE — Assessment & Plan Note (Signed)
Status post left carotid endarterectomy 3 to 4 years ago.  Being checked annually.  Scheduled to be checked again later this year

## 2019-06-01 NOTE — Progress Notes (Signed)
MRN : [id number]  Brett Riley is a 84 y.o. ([birth year]) male who presents with chief complaint of  Chief Complaint  Patient presents with  . Follow-up    1 yr Aorta -illiac  .  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  He is scheduled to have his carotids checked in a few months.  No aneurysm related symptoms. Specifically, the patient denies new back or abdominal pain, or signs of peripheral embolization. Measures 1.6 cm in maximal diameter actually in both common iliac arteries today.  Previously has been in the 1.7-1.8 range on the left.   Current Outpatient Medications  Medication Sig Dispense Refill  . aspirin 81 MG tablet Take 81 mg by mouth at bedtime.     . clopidogrel (PLAVIX) 75 MG tablet TAKE 1 TABLET (75 MG TOTAL) BY MOUTH DAILY WITH BREAKFAST. 90 tablet 1  . lisinopril (ZESTRIL) 20 MG tablet Take 1 tablet (20 mg total) by mouth daily. 90 tablet 3  . metoprolol succinate (TOPROL-XL) 100 MG 24 hr tablet TAKE 1 TABLET BY MOUTH EVERY DAY WITH A MEAL 90 tablet 2  . Multiple Vitamins-Iron (MULTIVITAMINS WITH IRON) TABS Take 1 tablet by mouth daily.    . rosuvastatin (CRESTOR) 10 MG tablet Take 1 tablet (10 mg total) by mouth daily. 90 tablet 3  . vitamin B-12 (CYANOCOBALAMIN) 1000 MCG tablet Take 1,000 mcg by mouth daily.    . nitroGLYCERIN (NITROSTAT) 0.4 MG SL tablet Place 1 tablet (0.4 mg total) under the tongue every 5 (five) minutes as needed. (Patient not taking: Reported on 06/01/2019) 25 tablet 6   No current facility-administered medications for this visit.    Past Medical History:  Diagnosis Date  . Carotid arterial disease ([place])    a. 02/2016 L CEA; b. [id number] U/S: patent LICA, 123456 RICA.  Marland Kitchen Coronary artery disease    a. 01/2016 MV: mild apical/basal inferior, apical lateral, mid anterolateral, and mid inferolateral ischemia. EF 57%; b. 02/2016 Cath: LM 40ost, LAD 70p/m, 20d, D1 95 (small), LCX nl, RCA 72m, RPDA 90  (small), EF 55-65%-->med Rx. Rec CABG for recurrent symptoms.  . History of echocardiogram    a. 02/2016 Echo: EF 50-55%, no rwma, mild MR.  Marland Kitchen Hypercholesterolemia   . Hypertension     Past Surgical History:  Procedure Laterality Date  . CARDIAC CATHETERIZATION N/A 01/19/2016   Procedure: Left Heart Cath and Coronary Angiography;  Surgeon: Wellington Hampshire, MD;  Location: [facility];  Service: Cardiovascular;  Laterality: N/A;  . ENDARTERECTOMY Left 02/15/2016   Procedure: ENDARTERECTOMY CAROTID;  Surgeon: Algernon Huxley, MD;  Location: [facility];  Service: Vascular;  Laterality: Left;     Social History   Tobacco Use  . Smoking status: Never Smoker  . Smokeless tobacco: Never Used  Substance Use Topics  . Alcohol use: No    Alcohol/week: 0.0 standard drinks  . Drug use: No    Family History  Problem Relation Age of Onset  . Heart disease Father        MI  . Heart attack Father   . Stroke Mother   . Breast cancer Sister   . Colon cancer Neg Hx   . Prostate cancer Neg Hx      No Known Allergies   REVIEW OF SYSTEMS (Negative unless checked)  Constitutional: [] Weight loss  [] Fever  [] Chills Cardiac: [] Chest pain   [] Chest pressure   [] Palpitations   [] Shortness  of breath when laying flat   [] Shortness of breath at rest   [] Shortness of breath with exertion. Vascular:  [] Pain in legs with walking   [] Pain in legs at rest   [] Pain in legs when laying flat   [] Claudication   [] Pain in feet when walking  [] Pain in feet at rest  [] Pain in feet when laying flat   [] History of DVT   [] Phlebitis   [] Swelling in legs   [] Varicose veins   [] Non-healing ulcers Pulmonary:   [] Uses home oxygen   [] Productive cough   [] Hemoptysis   [] Wheeze  [] COPD   [] Asthma Neurologic:  [] Dizziness  [] Blackouts   [] Seizures   [] History of stroke   [] History of TIA  [] Aphasia   [] Temporary blindness   [] Dysphagia   [] Weakness or numbness in arms   [] Weakness or numbness in legs Musculoskeletal:   [x] Arthritis   [] Joint swelling   [] Joint pain   [] Low back pain Hematologic:  [] Easy bruising  [] Easy bleeding   [] Hypercoagulable state   [] Anemic   Gastrointestinal:  [] Blood in stool   [] Vomiting blood  [] Gastroesophageal reflux/heartburn   [] Abdominal pain Genitourinary:  [] Chronic kidney disease   [] Difficult urination  [] Frequent urination  [] Burning with urination   [] Hematuria Skin:  [] Rashes   [] Ulcers   [] Wounds Psychological:  [] History of anxiety   []  History of major depression.  Physical Examination  BP (!) 168/84   Pulse (!) 58   Ht 6' (1.829 m)   Wt 200 lb (90.7 kg)   BMI 27.12 kg/m  Gen:  WD/WN, NAD Head: [place]/AT, No temporalis wasting. Ear/Nose/Throat: Hearing grossly intact, nares w/o erythema or drainage Eyes: Conjunctiva clear. Sclera non-icteric Neck: Supple.  Trachea midline Pulmonary:  Good air movement, no use of accessory muscles.  Cardiac: RRR, no JVD Vascular:  Vessel Right Left  Radial Palpable Palpable                          PT Palpable Palpable  DP Palpable Palpable    Musculoskeletal: M/S 5/5 throughout.  No deformity or atrophy. No edema. Neurologic: Sensation grossly intact in extremities.  Symmetrical.  Speech is fluent.  Psychiatric: Judgment intact, Mood & affect appropriate for pt's clinical situation. Dermatologic: No rashes or ulcers noted.  No cellulitis or open wounds.       Labs Recent Results (from the past 2160 hour(s))  Basic Metabolic Panel (BMET)     Status: None   Collection Time: 03/24/19 10:46 AM  Result Value Ref Range   Glucose 90 65 - 99 mg/dL   BUN 18 8 - 27 mg/dL   Creatinine, Ser 0.98 0.76 - 1.27 mg/dL   GFR calc non Af Amer 72 >59 mL/min/1.73   GFR calc Af Amer 83 >59 mL/min/1.73   BUN/Creatinine Ratio 18 10 - 24   Sodium 141 134 - 144 mmol/L   Potassium 4.6 3.5 - 5.2 mmol/L   Chloride 103 96 - 106 mmol/L   CO2 24 20 - 29 mmol/L   Calcium 10.0 8.6 - 10.2 mg/dL  CBC with Differential/Platelet      Status: Abnormal   Collection Time: 05/10/19 10:58 AM  Result Value Ref Range   WBC 5.2 4.0 - 10.5 K/uL   RBC 4.31 4.22 - 5.81 Mil/uL   Hemoglobin 14.2 13.0 - 17.0 g/dL   HCT 41.9 39.0 - 52.0 %   MCV 97.3 78.0 - 100.0 fl   MCHC 33.9 30.0 - 36.0  g/dL   RDW 13.2 11.5 - 15.5 %   Platelets 139.0 (L) 150.0 - 400.0 K/uL   Neutrophils Relative % 52.9 43.0 - 77.0 %   Lymphocytes Relative 34.9 12.0 - 46.0 %   Monocytes Relative 8.1 3.0 - 12.0 %   Eosinophils Relative 3.5 0.0 - 5.0 %   Basophils Relative 0.6 0.0 - 3.0 %   Neutro Abs 2.7 1.4 - 7.7 K/uL   Lymphs Abs 1.8 0.7 - 4.0 K/uL   Monocytes Absolute 0.4 0.1 - 1.0 K/uL   Eosinophils Absolute 0.2 0.0 - 0.7 K/uL   Basophils Absolute 0.0 0.0 - 0.1 K/uL  Hepatic function panel     Status: None   Collection Time: 05/10/19 10:58 AM  Result Value Ref Range   Total Bilirubin 0.8 0.2 - 1.2 mg/dL   Bilirubin, Direct 0.2 0.0 - 0.3 mg/dL   Alkaline Phosphatase 77 39 - 117 U/L   AST 23 0 - 37 U/L   ALT 22 0 - 53 U/L   Total Protein 7.3 6.0 - 8.3 g/dL   Albumin 4.6 3.5 - 5.2 g/dL  Lipid panel     Status: None   Collection Time: 05/10/19 10:58 AM  Result Value Ref Range   Cholesterol 116 0 - 200 mg/dL    Comment: ATP III Classification       Desirable:  < 200 mg/dL               Borderline High:  200 - 239 mg/dL          High:  > = 240 mg/dL   Triglycerides 91.0 0.0 - 149.0 mg/dL    Comment: Normal:  <150 mg/dLBorderline High:  150 - 199 mg/dL   HDL 40.30 >39.00 mg/dL   VLDL 18.2 0.0 - 40.0 mg/dL   LDL Cholesterol 57 0 - 99 mg/dL   Total CHOL/HDL Ratio 3     Comment:                Men          Women1/2 Average Risk     3.4          3.3Average Risk          5.0          4.42X Average Risk          9.6          7.13X Average Risk          15.0          11.0                       NonHDL 75.33     Comment: NOTE:  Non-HDL goal should be 30 mg/dL higher than patient's LDL goal (i.e. LDL goal of < 70 mg/dL, would have non-HDL goal of < 100 mg/dL)    TSH     Status: None   Collection Time: 05/10/19 10:58 AM  Result Value Ref Range   TSH 2.88 0.35 - 4.50 uIU/mL  Basic metabolic panel     Status: Abnormal   Collection Time: 05/10/19 10:58 AM  Result Value Ref Range   Sodium 139 135 - 145 mEq/L   Potassium 4.7 3.5 - 5.1 mEq/L   Chloride 104 96 - 112 mEq/L   CO2 28 19 - 32 mEq/L   Glucose, Bld 101 (H) 70 - 99 mg/dL   BUN 24 (H) 6 - 23 mg/dL  Creatinine, Ser 1.10 0.40 - 1.50 mg/dL   GFR 64.19 >60.00 mL/min   Calcium 9.9 8.4 - 10.5 mg/dL    Radiology No results found.  Assessment/Plan Essential hypertension, benign blood pressure control important in reducing the progression of atherosclerotic disease. On appropriate oral medications.   Hypercholesterolemia lipid control important in reducing the progression of atherosclerotic disease. Continue statin therapy  Carotid stenosis Status post left carotid endarterectomy 3 to 4 years ago.  Being checked annually.  Scheduled to be checked again later this year  Aneurysm artery, iliac common (HCC) Measures 1.6 cm in maximal diameter actually in both common iliac arteries today.  Previously has been in the 1.7-1.8 range on the left.  No role for intervention at this time.  We can follow this in about a year or although delaying a few months to try to line up with his carotid studies is certainly okay as well    Leotis Pain, MD  06/01/2019 10:08 AM    This note was created with Dragon medical transcription system.  Any errors from dictation are purely unintentional

## 2019-06-01 NOTE — Assessment & Plan Note (Signed)
Measures 1.6 cm in maximal diameter actually in both common iliac arteries today.  Previously has been in the 1.7-1.8 range on the left.  No role for intervention at this time.  We can follow this in about a year or although delaying a few months to try to line up with his carotid studies is certainly okay as well

## 2019-06-30 DIAGNOSIS — R0902 Hypoxemia: Secondary | ICD-10-CM | POA: Diagnosis not present

## 2019-06-30 DIAGNOSIS — R69 Illness, unspecified: Secondary | ICD-10-CM | POA: Diagnosis not present

## 2019-06-30 DIAGNOSIS — G4700 Insomnia, unspecified: Secondary | ICD-10-CM | POA: Diagnosis not present

## 2019-07-06 DIAGNOSIS — H6123 Impacted cerumen, bilateral: Secondary | ICD-10-CM | POA: Diagnosis not present

## 2019-07-06 DIAGNOSIS — H903 Sensorineural hearing loss, bilateral: Secondary | ICD-10-CM | POA: Diagnosis not present

## 2019-08-12 ENCOUNTER — Other Ambulatory Visit: Payer: Self-pay | Admitting: Internal Medicine

## 2019-08-24 ENCOUNTER — Telehealth: Payer: Self-pay | Admitting: Internal Medicine

## 2019-08-24 NOTE — Telephone Encounter (Signed)
Left message for patient to call back and schedule Medicare Annual Wellness Visit (AWV) either virtually or audio only.  Last AWV 05/08/18; please schedule at anytime with Denisa O'Brien-Blaney at [facility].

## 2019-08-24 NOTE — Telephone Encounter (Signed)
Pt is scheduled for 08/27/19

## 2019-08-27 ENCOUNTER — Ambulatory Visit (INDEPENDENT_AMBULATORY_CARE_PROVIDER_SITE_OTHER): Payer: Medicare HMO

## 2019-08-27 VITALS — Ht 72.0 in | Wt 200.0 lb

## 2019-08-27 DIAGNOSIS — Z0000 Encounter for general adult medical examination without abnormal findings: Secondary | ICD-10-CM | POA: Diagnosis not present

## 2019-08-27 NOTE — Patient Instructions (Addendum)
Brett Riley , Thank you for taking time to come for your Medicare Wellness Visit. I appreciate your ongoing commitment to your health goals. Please review the following plan we discussed and let me know if I can assist you in the future.   These are the goals we discussed: Goals      Patient Stated   .  Weight (lb) < 190 lb (86.2 kg) (pt-stated)      Portion control Low carb diet        This is a list of the screening recommended for you and due dates:  Health Maintenance  Topic Date Due  . Flu Shot  10/03/2019  . Tetanus Vaccine  12/22/2022  . COVID-19 Vaccine  Completed  . Pneumonia vaccines  Completed    Immunizations Immunization History  Administered Date(s) Administered  . Influenza, High Dose Seasonal PF 12/27/2016, 05/08/2018  . Influenza,inj,Quad PF,6+ Mos 01/21/2013, 11/22/2013, 03/09/2015, 02/16/2016  . PFIZER SARS-COV-2 Vaccination 03/22/2019, 04/12/2019  . Pneumococcal Conjugate-13 02/01/2014  . Pneumococcal Polysaccharide-23 04/30/2016  . Td 12/21/2012   Keep all routine maintenance appointments.   Follow up 10/07/19  Advanced directives: End of life planning; Advance aging; Advanced directives discussed.  Copy of current HCPOA/Living Will requested.    Conditions/risks identified: none new  Follow up in one year for your annual wellness visit.   Preventive Care 76 Years and Older, Male Preventive care refers to lifestyle choices and visits with your health care provider that can promote health and wellness. What does preventive care include?  A yearly physical exam. This is also called an annual well check.  Dental exams once or twice a year.  Routine eye exams. Ask your health care provider how often you should have your eyes checked.  Personal lifestyle choices, including:  Daily care of your teeth and gums.  Regular physical activity.  Eating a healthy diet.  Avoiding tobacco and drug use.  Limiting alcohol use.  Practicing safe  sex.  Taking low doses of aspirin every day.  Taking vitamin and mineral supplements as recommended by your health care provider. What happens during an annual well check? The services and screenings done by your health care provider during your annual well check will depend on your age, overall health, lifestyle risk factors, and family history of disease. Counseling  Your health care provider may ask you questions about your:  Alcohol use.  Tobacco use.  Drug use.  Emotional well-being.  Home and relationship well-being.  Sexual activity.  Eating habits.  History of falls.  Memory and ability to understand (cognition).  Work and work Statistician. Screening  You may have the following tests or measurements:  Height, weight, and BMI.  Blood pressure.  Lipid and cholesterol levels. These may be checked every 5 years, or more frequently if you are over 62 years old.  Skin check.  Lung cancer screening. You may have this screening every year starting at age 72 if you have a 30-pack-year history of smoking and currently smoke or have quit within the past 15 years.  Fecal occult blood test (FOBT) of the stool. You may have this test every year starting at age 18.  Flexible sigmoidoscopy or colonoscopy. You may have a sigmoidoscopy every 5 years or a colonoscopy every 10 years starting at age 81.  Prostate cancer screening. Recommendations will vary depending on your family history and other risks.  Hepatitis C blood test.  Hepatitis B blood test.  Sexually transmitted disease (STD) testing.  Diabetes  screening. This is done by checking your blood sugar (glucose) after you have not eaten for a while (fasting). You may have this done every 1-3 years.  Abdominal aortic aneurysm (AAA) screening. You may need this if you are a current or former smoker.  Osteoporosis. You may be screened starting at age 30 if you are at high risk. Talk with your health care provider  about your test results, treatment options, and if necessary, the need for more tests. Vaccines  Your health care provider may recommend certain vaccines, such as:  Influenza vaccine. This is recommended every year.  Tetanus, diphtheria, and acellular pertussis (Tdap, Td) vaccine. You may need a Td booster every 10 years.  Zoster vaccine. You may need this after age 30.  Pneumococcal 13-valent conjugate (PCV13) vaccine. One dose is recommended after age 34.  Pneumococcal polysaccharide (PPSV23) vaccine. One dose is recommended after age 65. Talk to your health care provider about which screenings and vaccines you need and how often you need them. This information is not intended to replace advice given to you by your health care provider. Make sure you discuss any questions you have with your health care provider. Document Released: 03/17/2015 Document Revised: 11/08/2015 Document Reviewed: 12/20/2014 Elsevier Interactive Patient Education  2017 [employer] Prevention in the Home Falls can cause injuries. They can happen to people of all ages. There are many things you can do to make your home safe and to help prevent falls. What can I do on the outside of my home?  Regularly fix the edges of walkways and driveways and fix any cracks.  Remove anything that might make you trip as you walk through a door, such as a raised step or threshold.  Trim any bushes or trees on the path to your home.  Use bright outdoor lighting.  Clear any walking paths of anything that might make someone trip, such as rocks or tools.  Regularly check to see if handrails are loose or broken. Make sure that both sides of any steps have handrails.  Any raised decks and porches should have guardrails on the edges.  Have any leaves, snow, or ice cleared regularly.  Use sand or salt on walking paths during winter.  Clean up any spills in your garage right away. This includes oil or grease  spills. What can I do in the bathroom?  Use night lights.  Install grab bars by the toilet and in the tub and shower. Do not use towel bars as grab bars.  Use non-skid mats or decals in the tub or shower.  If you need to sit down in the shower, use a plastic, non-slip stool.  Keep the floor dry. Clean up any water that spills on the floor as soon as it happens.  Remove soap buildup in the tub or shower regularly.  Attach bath mats securely with double-sided non-slip rug tape.  Do not have throw rugs and other things on the floor that can make you trip. What can I do in the bedroom?  Use night lights.  Make sure that you have a light by your bed that is easy to reach.  Do not use any sheets or blankets that are too big for your bed. They should not hang down onto the floor.  Have a firm chair that has side arms. You can use this for support while you get dressed.  Do not have throw rugs and other things on the floor that can make  you trip. What can I do in the kitchen?  Clean up any spills right away.  Avoid walking on wet floors.  Keep items that you use a lot in easy-to-reach places.  If you need to reach something above you, use a strong step stool that has a grab bar.  Keep electrical cords out of the way.  Do not use floor polish or wax that makes floors slippery. If you must use wax, use non-skid floor wax.  Do not have throw rugs and other things on the floor that can make you trip. What can I do with my stairs?  Do not leave any items on the stairs.  Make sure that there are handrails on both sides of the stairs and use them. Fix handrails that are broken or loose. Make sure that handrails are as long as the stairways.  Check any carpeting to make sure that it is firmly attached to the stairs. Fix any carpet that is loose or worn.  Avoid having throw rugs at the top or bottom of the stairs. If you do have throw rugs, attach them to the floor with carpet  tape.  Make sure that you have a light switch at the top of the stairs and the bottom of the stairs. If you do not have them, ask someone to add them for you. What else can I do to help prevent falls?  Wear shoes that:  Do not have high heels.  Have rubber bottoms.  Are comfortable and fit you well.  Are closed at the toe. Do not wear sandals.  If you use a stepladder:  Make sure that it is fully opened. Do not climb a closed stepladder.  Make sure that both sides of the stepladder are locked into place.  Ask someone to hold it for you, if possible.  Clearly mark and make sure that you can see:  Any grab bars or handrails.  First and last steps.  Where the edge of each step is.  Use tools that help you move around (mobility aids) if they are needed. These include:  Canes.  Walkers.  Scooters.  Crutches.  Turn on the lights when you go into a dark area. Replace any light bulbs as soon as they burn out.  Set up your furniture so you have a clear path. Avoid moving your furniture around.  If any of your floors are uneven, fix them.  If there are any pets around you, be aware of where they are.  Review your medicines with your doctor. Some medicines can make you feel dizzy. This can increase your chance of falling. Ask your doctor what other things that you can do to help prevent falls. This information is not intended to replace advice given to you by your health care provider. Make sure you discuss any questions you have with your health care provider. Document Released: 12/15/2008 Document Revised: 07/27/2015 Document Reviewed: 03/25/2014 Elsevier Interactive Patient Education  2017 [employer].

## 2019-08-27 NOTE — Progress Notes (Addendum)
Subjective:   SHNEUR WHITTENBURG is a 84 y.o. male who presents for Medicare Annual/Subsequent preventive examination.  Review of Systems    No ROS.  Medicare Wellness Virtual Visit.   Cardiac Risk Factors include: advanced age (>55men, >33 women);hypertension;male gender     Objective:    Today's Vitals   08/27/19 0838  Weight: 200 lb (90.7 kg)  Height: 6' (1.829 m)   Body mass index is 27.12 kg/m.  Advanced Directives 08/27/2019 05/08/2018 06/26/2017 05/06/2017 03/24/2017 03/24/2017 07/05/2016  Does Patient Have a Medical Advance Directive? Yes Yes Yes Yes Yes Yes Yes  Type of Paramedic of [place];Living will [place];Living will Living will;Healthcare Power of [place];Living will [place];Living will Living will;Healthcare Power of [place];Living will  Does patient want to make changes to medical advance directive? No - Patient declined No - Patient declined - No - Patient declined No - Patient declined - -  Copy of [place] in Chart? No - copy requested No - copy requested - No - copy requested No - copy requested - -    Current Medications (verified) Outpatient Encounter Medications as of 08/27/2019  Medication Sig  . aspirin 81 MG tablet Take 81 mg by mouth at bedtime.   . clopidogrel (PLAVIX) 75 MG tablet TAKE 1 TABLET (75 MG TOTAL) BY MOUTH DAILY WITH BREAKFAST.  Marland Kitchen lisinopril (ZESTRIL) 20 MG tablet Take 1 tablet (20 mg total) by mouth daily.  . metoprolol succinate (TOPROL-XL) 100 MG 24 hr tablet TAKE 1 TABLET BY MOUTH EVERY DAY WITH A MEAL  . Multiple Vitamins-Iron (MULTIVITAMINS WITH IRON) TABS Take 1 tablet by mouth daily.  . nitroGLYCERIN (NITROSTAT) 0.4 MG SL tablet Place 1 tablet (0.4 mg total) under the tongue every 5 (five) minutes as needed. (Patient not taking: Reported on 06/01/2019)  . rosuvastatin (CRESTOR) 10 MG tablet Take 1  tablet (10 mg total) by mouth daily.  . vitamin B-12 (CYANOCOBALAMIN) 1000 MCG tablet Take 1,000 mcg by mouth daily.   No facility-administered encounter medications on file as of 08/27/2019.    Allergies (verified) Patient has no known allergies.   History: Past Medical History:  Diagnosis Date  . Carotid arterial disease ([place])    a. 02/2016 L CEA; b. 24/2683 U/S: patent LICA, 4-19% RICA.  Marland Kitchen Coronary artery disease    a. 01/2016 MV: mild apical/basal inferior, apical lateral, mid anterolateral, and mid inferolateral ischemia. EF 57%; b. 02/2016 Cath: LM 40ost, LAD 70p/m, 20d, D1 95 (small), LCX nl, RCA 57m, RPDA 90 (small), EF 55-65%-->med Rx. Rec CABG for recurrent symptoms.  . History of echocardiogram    a. 02/2016 Echo: EF 50-55%, no rwma, mild MR.  Marland Kitchen Hypercholesterolemia   . Hypertension    Past Surgical History:  Procedure Laterality Date  . CARDIAC CATHETERIZATION N/A 01/19/2016   Procedure: Left Heart Cath and Coronary Angiography;  Surgeon: Wellington Hampshire, MD;  Location: [place] [facility];  Service: Cardiovascular;  Laterality: N/A;  . ENDARTERECTOMY Left 02/15/2016   Procedure: ENDARTERECTOMY CAROTID;  Surgeon: Algernon Huxley, MD;  Location: [facility];  Service: Vascular;  Laterality: Left;   Family History  Problem Relation Age of Onset  . Heart disease Father        MI  . Heart attack Father   . Stroke Mother   . Breast cancer Sister   . Colon cancer Neg Hx   .  Prostate cancer Neg Hx    Social History   Socioeconomic History  . Marital status: Married    Spouse name: Not on file  . Number of children: 1  . Years of education: Not on file  . Highest education level: Not on file  Occupational History    Employer: [employer]  Tobacco Use  . Smoking status: Never Smoker  . Smokeless tobacco: Never Used  Vaping Use  . Vaping Use: Never used  Substance and Sexual Activity  . Alcohol use: No    Alcohol/week: 0.0 standard drinks  . Drug use: No  .  Sexual activity: Not Currently  Other Topics Concern  . Not on file  Social History Narrative  . Not on file   Social Determinants of Health   Financial Resource Strain:   . Difficulty of Paying Living Expenses:   Food Insecurity:   . Worried About Charity fundraiser in the Last Year:   . Arboriculturist in the Last Year:   Transportation Needs:   . Film/video editor (Medical):   Marland Kitchen Lack of Transportation (Non-Medical):   Physical Activity:   . Days of Exercise per Week:   . Minutes of Exercise per Session:   Stress:   . Feeling of Stress :   Social Connections:   . Frequency of Communication with Friends and Family:   . Frequency of Social Gatherings with Friends and Family:   . Attends Religious Services:   . Active Member of Clubs or Organizations:   . Attends Archivist Meetings:   Marland Kitchen Marital Status:     Tobacco Counseling Counseling given: Not Answered   Clinical Intake:  Pre-visit preparation completed: Yes        Diabetes: No  How often do you need to have someone help you when you read instructions, pamphlets, or other written materials from your doctor or pharmacy?: 1 - Never  Interpreter Needed?: No      Activities of Daily Living In your present state of health, do you have any difficulty performing the following activities: 08/27/2019  Hearing? Y  Comment Hearing aids  Vision? N  Difficulty concentrating or making decisions? N  Walking or climbing stairs? N  Dressing or bathing? N  Doing errands, shopping? N  Preparing Food and eating ? N  Using the Toilet? N  In the past six months, have you accidently leaked urine? N  Do you have problems with loss of bowel control? N  Managing your Medications? N  Managing your Finances? N  Housekeeping or managing your Housekeeping? N  Some recent data might be hidden    Patient Care Team: Einar Pheasant, MD as PCP - General (Internal Medicine) Wellington Hampshire, MD as PCP -  Cardiology (Cardiology) Einar Pheasant, MD (Internal Medicine) Bary Castilla Forest Gleason, MD (General Surgery)  Indicate any recent Medical Services you may have received from other than [facility] providers in the past year (date may be approximate).     Assessment:   This is a routine wellness examination for Julus.  I connected with Chucky today by telephone and verified that I am speaking with the correct person using two identifiers. Location patient: home Location provider: work Persons participating in the virtual visit: patient, Marine scientist.    I discussed the limitations, risks, security and privacy concerns of performing an evaluation and management service by telephone and the availability of in person appointments. The patient expressed understanding and verbally consented to this telephonic  visit.    Interactive audio and video telecommunications were attempted between this provider and patient, however failed, due to patient having technical difficulties OR patient did not have access to video capability.  We continued and completed visit with audio only.  Some vital signs may be absent or patient reported.   Hearing/Vision screen  Hearing Screening   125Hz  250Hz  500Hz  1000Hz  2000Hz  3000Hz  4000Hz  6000Hz  8000Hz   Right ear:           Left ear:           Comments: Hearing aid, bilateral   Vision Screening Comments: Followed by Dr. Atilano Median Wears corrective lenses Visual acuity not assessed, virtual visit.  They have seen their ophthalmologist in the last 12 months.   Dietary issues and exercise activities discussed: Healthy diet Good water intake 1 cup of coffee Current Exercise Habits: Home exercise routine, Type of exercise: walking, Intensity: Mild  Goals      Patient Stated   .  Weight (lb) < 190 lb (86.2 kg) (pt-stated)      Portion control Low carb diet       Depression Screen PHQ 2/9 Scores 08/27/2019 05/08/2018 05/06/2017 09/24/2016 04/30/2016 09/07/2015 09/06/2014  PHQ - 2 Score 0  0 0 0 0 0 0  PHQ- 9 Score - - - 4 - - -    Fall Risk Fall Risk  08/27/2019 05/10/2019 05/08/2018 05/06/2017 04/30/2016  Falls in the past year? 0 0 0 No No  Follow up Falls evaluation completed - - - -    Handrails in use when climbing stairs Yes  Home free of loose throw rugs in walkways, pet beds, electrical cords, etc? Yes  Adequate lighting in your home to reduce risk of falls? Yes   ASSISTIVE DEVICES UTILIZED TO PREVENT FALLS: Use of a cane, walker or w/c? No  Grab bars in the bathroom? No  Shower chair or bench in shower? No  Elevated toilet seat or a handicapped toilet? No   TIMED UP AND GO:  Was the test performed? No, virtual visit   Cognitive Function: MMSE - Mini Mental State Exam 05/06/2017 04/30/2016  Orientation to time 5 5  Orientation to Place 5 5  Registration 3 3  Attention/ Calculation 5 5  Recall 2 3  Language- name 2 objects 2 2  Language- repeat 1 1  Language- follow 3 step command 3 3  Language- read & follow direction 1 1  Write a sentence 1 1  Copy design 1 1  Total score 29 30     6CIT Screen 08/27/2019 05/08/2018  What Year? 0 points 0 points  What month? 0 points 0 points  What time? - 0 points  Count back from 20 - 0 points  Months in reverse 0 points 0 points  Repeat phrase - 0 points  Total Score - 0    Immunizations Immunization History  Administered Date(s) Administered  . Influenza, High Dose Seasonal PF 12/27/2016, 05/08/2018  . Influenza,inj,Quad PF,6+ Mos 01/21/2013, 11/22/2013, 03/09/2015, 02/16/2016  . PFIZER SARS-COV-2 Vaccination 03/22/2019, 04/12/2019  . Pneumococcal Conjugate-13 02/01/2014  . Pneumococcal Polysaccharide-23 04/30/2016  . Td 12/21/2012    Health Maintenance There are no preventive care reminders to display for this patient. Health Maintenance  Topic Date Due  . INFLUENZA VACCINE  10/03/2019  . TETANUS/TDAP  12/22/2022  . COVID-19 Vaccine  Completed  . PNA vac Low Risk Adult  Completed   Dental Screening:  Recommended annual dental exams for proper oral  hygiene  Community Resource Referral / Chronic Care Management: CRR required this visit?  No  CCM required this visit?  No     Plan:   Keep all routine maintenance appointments.   Follow up 10/07/19  I have personally reviewed and noted the following in the patient's chart:   . Medical and social history . Use of alcohol, tobacco or illicit drugs  . Current medications and supplements . Functional ability and status . Nutritional status . Physical activity . Advanced directives . List of other physicians . Hospitalizations, surgeries, and ER visits in previous 12 months . Vitals . Screenings to include cognitive, depression, and falls . Referrals and appointments  In addition, I have reviewed and discussed with patient certain preventive protocols, quality metrics, and best practice recommendations. A written personalized care plan for preventive services as well as general preventive health recommendations were provided to patient via mail.     Varney Biles, LPN   2/[id number]/4462    Reviewed above information.  Agree with assessment and plan.    Dr Nicki Reaper

## 2019-09-03 ENCOUNTER — Other Ambulatory Visit: Payer: Self-pay | Admitting: Internal Medicine

## 2019-09-13 ENCOUNTER — Ambulatory Visit: Payer: Medicare HMO | Admitting: Internal Medicine

## 2019-09-19 ENCOUNTER — Encounter: Payer: Self-pay | Admitting: Emergency Medicine

## 2019-09-19 ENCOUNTER — Emergency Department
Admission: EM | Admit: 2019-09-19 | Discharge: 2019-09-19 | Disposition: A | Discharge status: Home / Self Care | Payer: Medicare HMO | Attending: Emergency Medicine | Admitting: Emergency Medicine

## 2019-09-19 ENCOUNTER — Emergency Department: Payer: Medicare HMO

## 2019-09-19 ENCOUNTER — Other Ambulatory Visit: Payer: Self-pay

## 2019-09-19 DIAGNOSIS — Z7982 Long term (current) use of aspirin: Secondary | ICD-10-CM | POA: Insufficient documentation

## 2019-09-19 DIAGNOSIS — R531 Weakness: Secondary | ICD-10-CM | POA: Insufficient documentation

## 2019-09-19 DIAGNOSIS — I2510 Atherosclerotic heart disease of native coronary artery without angina pectoris: Secondary | ICD-10-CM | POA: Insufficient documentation

## 2019-09-19 DIAGNOSIS — R61 Generalized hyperhidrosis: Secondary | ICD-10-CM | POA: Diagnosis not present

## 2019-09-19 DIAGNOSIS — Z79899 Other long term (current) drug therapy: Secondary | ICD-10-CM | POA: Insufficient documentation

## 2019-09-19 DIAGNOSIS — I10 Essential (primary) hypertension: Secondary | ICD-10-CM | POA: Diagnosis not present

## 2019-09-19 DIAGNOSIS — R509 Fever, unspecified: Secondary | ICD-10-CM | POA: Diagnosis not present

## 2019-09-19 DIAGNOSIS — R5383 Other fatigue: Secondary | ICD-10-CM | POA: Insufficient documentation

## 2019-09-19 LAB — CBC
HCT: 38.2 % — ABNORMAL LOW (ref 39.0–52.0)
Hemoglobin: 13.5 g/dL (ref 13.0–17.0)
MCH: 33.3 pg (ref 26.0–34.0)
MCHC: 35.3 g/dL (ref 30.0–36.0)
MCV: 94.3 fL (ref 80.0–100.0)
Platelets: 137 10*3/uL — ABNORMAL LOW (ref 150–400)
RBC: 4.05 MIL/uL — ABNORMAL LOW (ref 4.22–5.81)
RDW: 13.2 % (ref 11.5–15.5)
WBC: 5.6 10*3/uL (ref 4.0–10.5)
nRBC: 0 % (ref 0.0–0.2)

## 2019-09-19 LAB — BASIC METABOLIC PANEL WITH GFR
Anion gap: 9 (ref 5–15)
BUN: 25 mg/dL — ABNORMAL HIGH (ref 8–23)
CO2: 27 mmol/L (ref 22–32)
Calcium: 9.5 mg/dL (ref 8.9–10.3)
Chloride: 101 mmol/L (ref 98–111)
Creatinine, Ser: 1.16 mg/dL (ref 0.61–1.24)
GFR calc Af Amer: >60 mL/min
GFR calc non Af Amer: 59 mL/min — ABNORMAL LOW
Glucose, Bld: 104 mg/dL — ABNORMAL HIGH (ref 70–99)
Potassium: 4.2 mmol/L (ref 3.5–5.1)
Sodium: 137 mmol/L (ref 135–145)

## 2019-09-19 LAB — URINALYSIS, COMPLETE (UACMP) WITH MICROSCOPIC
Bacteria, UA: NONE SEEN
Bilirubin Urine: NEGATIVE
Glucose, UA: NEGATIVE mg/dL
Ketones, ur: NEGATIVE mg/dL
Leukocytes,Ua: NEGATIVE
Nitrite: NEGATIVE
Protein, ur: NEGATIVE mg/dL
Specific Gravity, Urine: 1.018 (ref 1.005–1.030)
Squamous Epithelial / HPF: NONE SEEN (ref 0–5)
pH: 5 (ref 5.0–8.0)

## 2019-09-19 LAB — CK: Total CK: 181 U/L (ref 49–397)

## 2019-09-19 LAB — TROPONIN I (HIGH SENSITIVITY): Troponin I (High Sensitivity): 11 ng/L

## 2019-09-19 IMAGING — CR DG CHEST 2V
1 series · 2 of 2 positions shown · non-contrast
Comparison: None.

CLINICAL DATA: Intermittent fever and weakness over the past week.
Diaphoresis last night.

EXAM:
CHEST - 2 VIEW

[Series 2: w chest pa · 0.14mm/px · 2 of 2 slices shown]
[im 1/2]
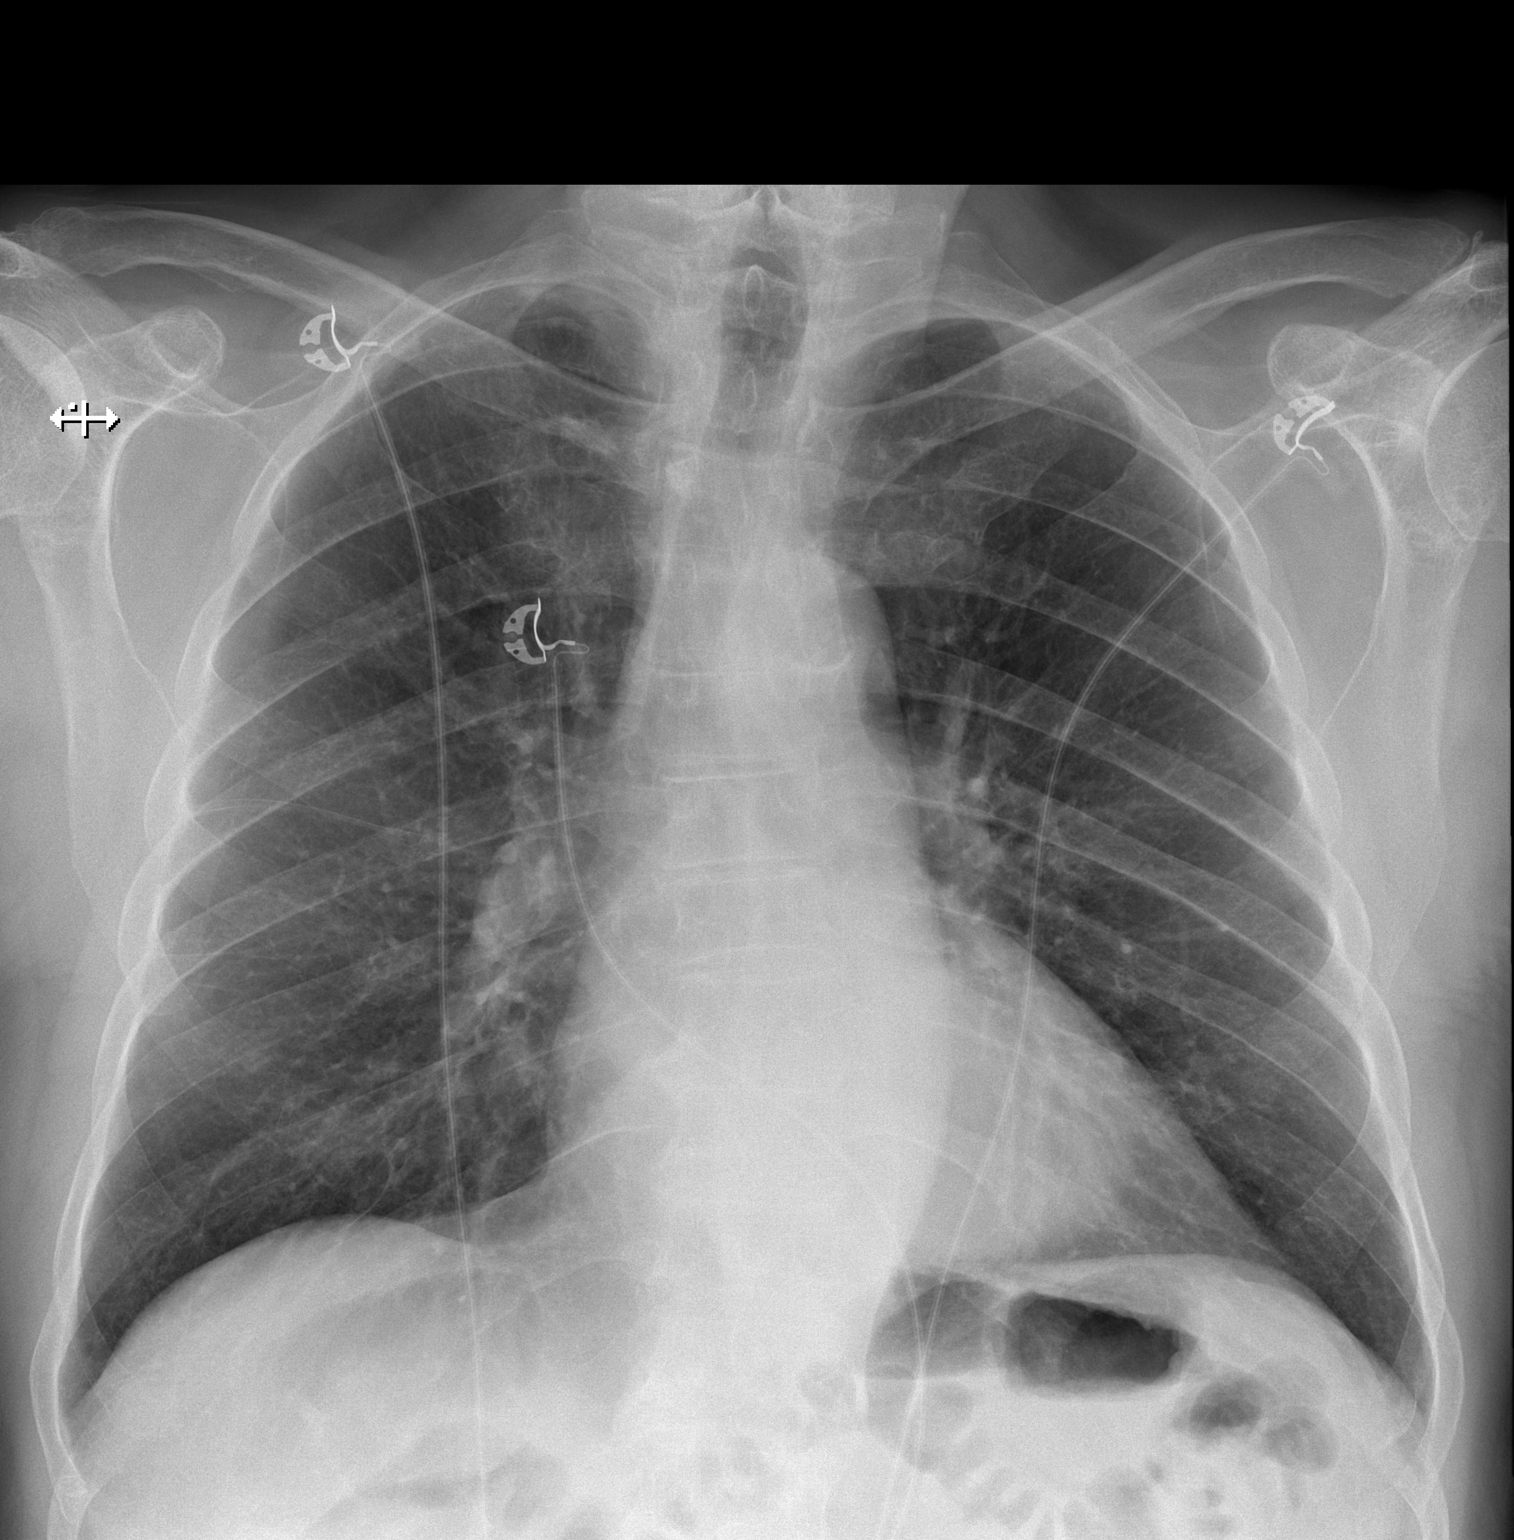
[im 2/2]
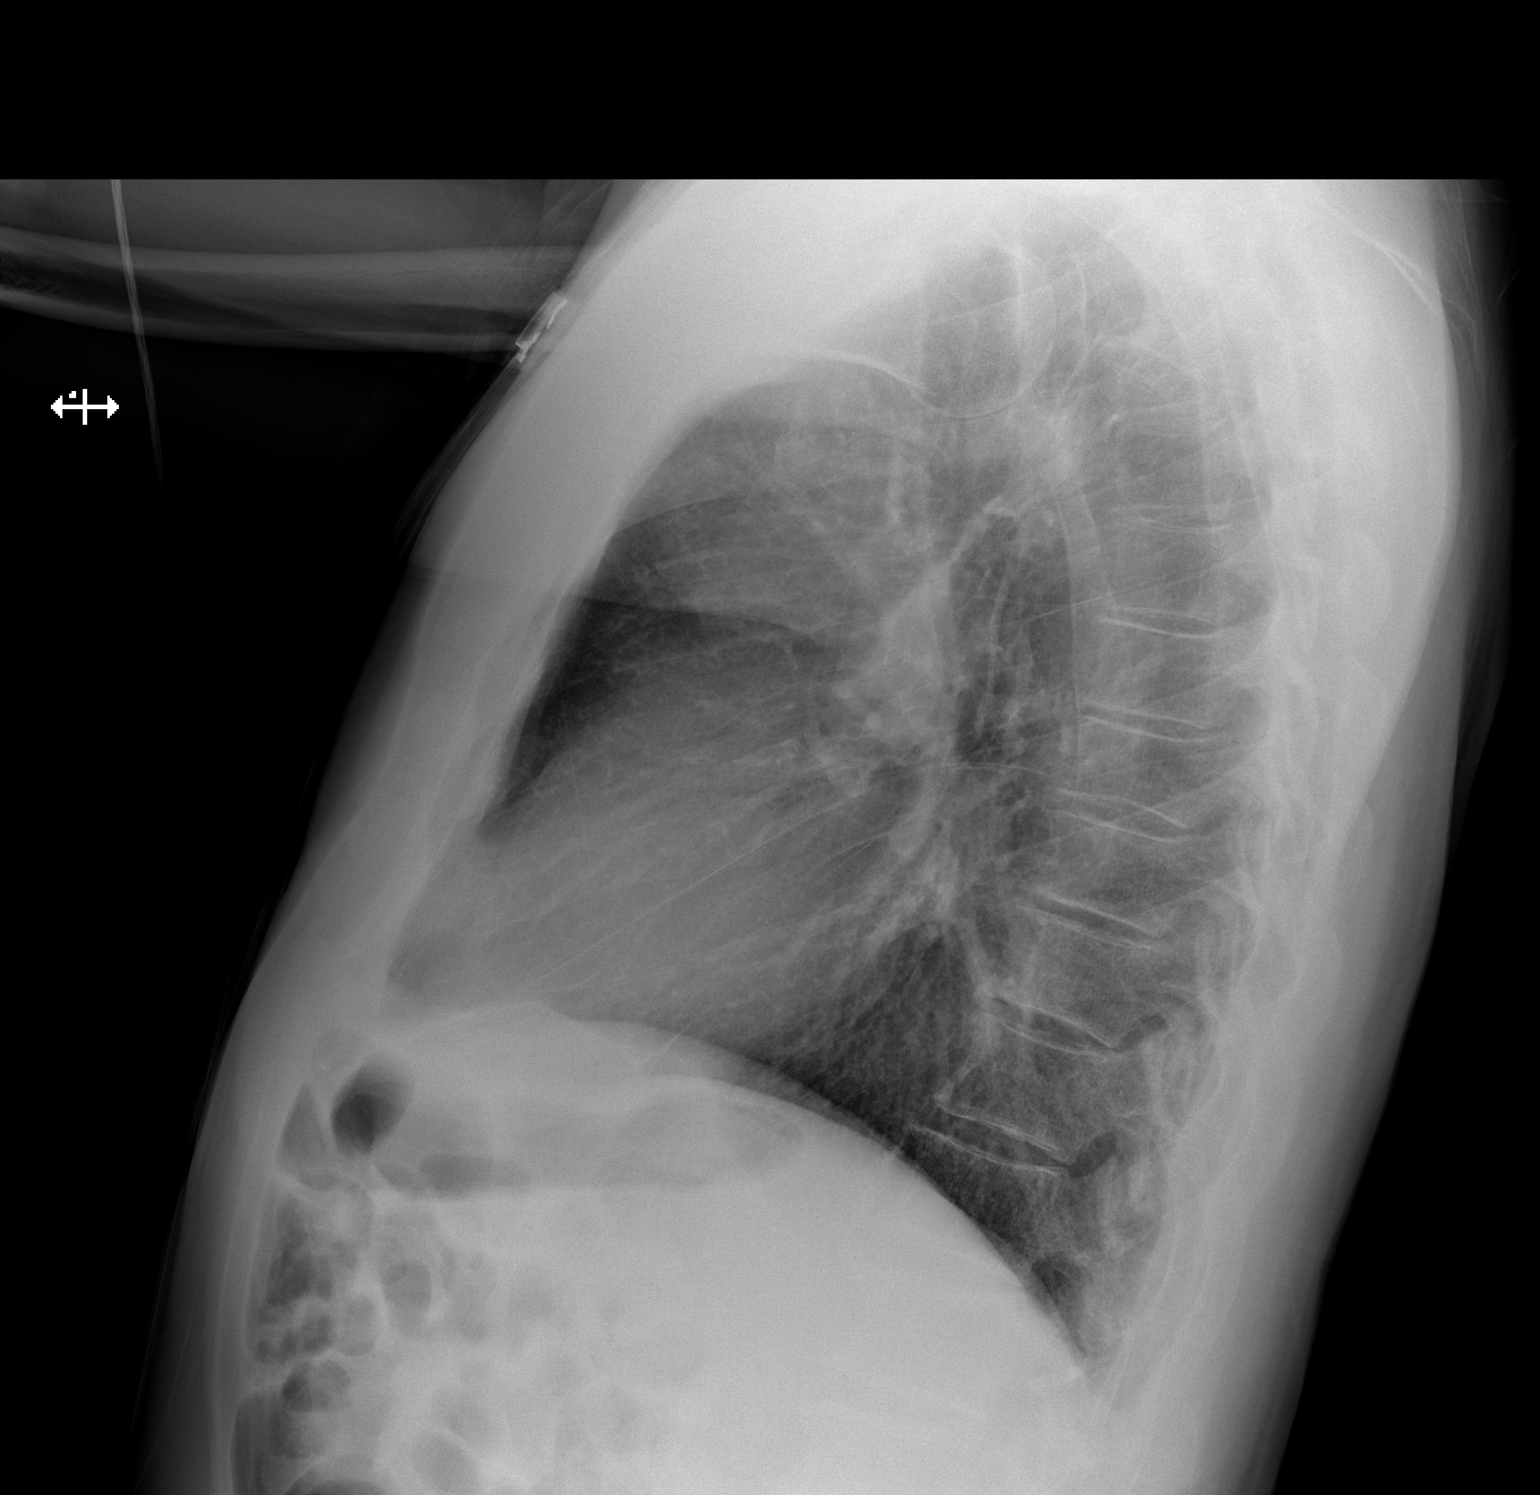

[2 of 2 positions shown; findings below may reference images not displayed]

FINDINGS: Lungs clear. Heart size normal. No pneumothorax or pleural effusion.
Aortic atherosclerosis. No acute or focal bony abnormality.
IMPRESSION: No acute disease.

Aortic Atherosclerosis ([WL]-[WL]).

## 2019-09-19 MED ORDER — SODIUM CHLORIDE 0.9% FLUSH
3.0000 mL | Freq: Once | INTRAVENOUS | Status: AC
  Administered 2019-09-19: 3 mL via INTRAVENOUS

## 2019-09-19 MED ORDER — SODIUM CHLORIDE 0.9 % IV BOLUS
500.0000 mL | Freq: Once | INTRAVENOUS | Status: AC
  Administered 2019-09-19: 500 mL via INTRAVENOUS

## 2019-09-19 NOTE — ED Provider Notes (Signed)
[facility] Emergency Department Provider Note ____________________________________________   First MD Initiated Contact with Patient 09/19/19 1126     (approximate)  I have reviewed the triage vital signs and the nursing notes.   HISTORY  Chief Complaint Fever and Weakness    HPI Brett Riley is a 84 y.o. male with PMH as noted below who presents with generalized weakness over the last several days, mainly manifesting as feeling weak on his legs, worse when he stands up and walks around, and associated with some subjective fevers and night sweats a few days ago which seemed to have resolved.  Patient denies any vomiting or diarrhea, chest pain or shortness of breath, urinary symptoms, or any focal pain.  He has no focal weakness or numbness.  He states he just came back from the beach 2 days ago.  Yesterday when he went to go try to mow the lawn he felt weak and had to sit down for a while and was not able to do it.  However, he states he feels fine right now.  Past Medical History:  Diagnosis Date  . Carotid arterial disease ([place])    a. 02/2016 L CEA; b. 31/5400 U/S: patent LICA, 8-67% RICA.  Marland Kitchen Coronary artery disease    a. 01/2016 MV: mild apical/basal inferior, apical lateral, mid anterolateral, and mid inferolateral ischemia. EF 57%; b. 02/2016 Cath: LM 40ost, LAD 70p/m, 20d, D1 95 (small), LCX nl, RCA 34m, RPDA 90 (small), EF 55-65%-->med Rx. Rec CABG for recurrent symptoms.  . History of echocardiogram    a. 02/2016 Echo: EF 50-55%, no rwma, mild MR.  Marland Kitchen Hypercholesterolemia   . Hypertension     Patient Active Problem List   Diagnosis Date Noted  . Hypertension 06/01/2019  . 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 Dizziness 08/04/2016  . Abdominal bruit 08/04/2016  . Carotid stenosis, asymptomatic, left 02/15/2016  . CAD (coronary artery disease)   . Health care maintenance 09/11/2014  . Hip region mass 12/12/2013  . Soft tissue  mass 11/28/2013  . Fatigue 07/28/2013  . Environmental allergies 07/28/2013  . Carotid stenosis 01/21/2013  . Thrombocytopenia ([place]) 07/26/2012  . Anemia 07/26/2012  . Essential hypertension, benign 05/24/2012  . Hypercholesterolemia 05/24/2012    Past Surgical History:  Procedure Laterality Date  . CARDIAC CATHETERIZATION N/A 01/19/2016   Procedure: Left Heart Cath and Coronary Angiography;  Surgeon: Wellington Hampshire, MD;  Location: [place] [facility];  Service: Cardiovascular;  Laterality: N/A;  . ENDARTERECTOMY Left 02/15/2016   Procedure: ENDARTERECTOMY CAROTID;  Surgeon: Algernon Huxley, MD;  Location: [facility];  Service: Vascular;  Laterality: Left;    Prior to Admission medications   Medication Sig Start Date End Date Taking? Authorizing Provider  aspirin 81 MG tablet Take 81 mg by mouth at bedtime.     [provider]  clopidogrel (PLAVIX) 75 MG tablet TAKE 1 TABLET (75 MG TOTAL) BY MOUTH DAILY WITH BREAKFAST. 08/12/19   Einar Pheasant, MD  lisinopril (ZESTRIL) 10 MG tablet TAKE 1 TABLET BY MOUTH EVERY DAY Patient taking differently: Take 10 mg by mouth daily.  09/03/19   Einar Pheasant, MD  lisinopril (ZESTRIL) 20 MG tablet Take 1 tablet (20 mg total) by mouth daily. 03/24/19 06/22/19  Loel Dubonnet, NP  metoprolol succinate (TOPROL-XL) 100 MG 24 hr tablet TAKE 1 TABLET BY MOUTH EVERY DAY WITH A MEAL Patient taking differently: Take 100 mg by mouth daily. TAKE 1 TABLET BY MOUTH  EVERY DAY WITH A MEAL 05/24/19   Einar Pheasant, MD  Multiple Vitamins-Iron (MULTIVITAMINS WITH IRON) TABS Take 1 tablet by mouth daily.    [provider]  nitroGLYCERIN (NITROSTAT) 0.4 MG SL tablet Place 1 tablet (0.4 mg total) under the tongue every 5 (five) minutes as needed. Patient not taking: Reported on 06/01/2019 01/23/16   Wende Bushy, MD  rosuvastatin (CRESTOR) 10 MG tablet Take 1 tablet (10 mg total) by mouth daily. 03/30/19   Loel Dubonnet, NP  vitamin B-12  (CYANOCOBALAMIN) 1000 MCG tablet Take 1,000 mcg by mouth daily.    [provider]    Allergies Patient has no known allergies.  Family History  Problem Relation Age of Onset  . Heart disease Father        MI  . Heart attack Father   . Stroke Mother   . Breast cancer Sister   . Colon cancer Neg Hx   . Prostate cancer Neg Hx     Social History Social History   Tobacco Use  . Smoking status: Never Smoker  . Smokeless tobacco: Never Used  Vaping Use  . Vaping Use: Never used  Substance Use Topics  . Alcohol use: No    Alcohol/week: 0.0 standard drinks  . Drug use: No    Review of Systems  Constitutional: No fever/chills.  Positive for fatigue. Eyes: No redness. ENT: No sore throat. Cardiovascular: Denies chest pain. Respiratory: Denies shortness of breath. Gastrointestinal: No vomiting or diarrhea.  Genitourinary: Negative for dysuria.  Musculoskeletal: Negative for back pain. Skin: Negative for rash. Neurological: Negative for headaches, focal weakness or numbness.   ____________________________________________   PHYSICAL EXAM:  VITAL SIGNS: ED Triage Vitals  Enc Vitals Group     BP 09/19/19 0930 (!) 144/98     Pulse Rate 09/19/19 0930 71     Resp 09/19/19 0930 16     Temp 09/19/19 0930 98.1 F (36.7 C)     Temp Source 09/19/19 0930 Oral     SpO2 09/19/19 0930 100 %     Weight 09/19/19 0931 195 lb (88.5 kg)     Height 09/19/19 0931 6' (1.829 m)     Head Circumference --      Peak Flow --      Pain Score 09/19/19 0930 0     Pain Loc --      Pain Edu? --      Excl. in [place]? --     Constitutional: Alert and oriented.  Relatively well appearing and in no acute distress. Eyes: Conjunctivae are normal.  Head: Atraumatic. Nose: No congestion/rhinnorhea. Mouth/Throat: Mucous membranes are moist.   Neck: Normal range of motion.  Cardiovascular: Normal rate, regular rhythm. Grossly normal heart sounds.  Good peripheral circulation. Respiratory:  Normal respiratory effort.  No retractions. Lungs CTAB. Gastrointestinal: Soft and nontender. No distention.  Genitourinary: No flank tenderness. Musculoskeletal: No lower extremity edema.  Extremities warm and well perfused.  Neurologic:  Normal speech and language. No gross focal neurologic deficits are appreciated.  5/5 motor strength and intact sensation to all extremities. Skin:  Skin is warm and dry. No rash noted. Psychiatric: Mood and affect are normal. Speech and behavior are normal.  ____________________________________________   LABS (all labs ordered are listed, but only abnormal results are displayed)  Labs Reviewed  BASIC METABOLIC PANEL - Abnormal; Notable for the following components:      Result Value   Glucose, Bld 104 (*)    BUN 25 (*)  GFR calc non Af Amer 59 (*)    All other components within normal limits  CBC - Abnormal; Notable for the following components:   RBC 4.05 (*)    HCT 38.2 (*)    Platelets 137 (*)    All other components within normal limits  URINALYSIS, COMPLETE (UACMP) WITH MICROSCOPIC - Abnormal; Notable for the following components:   Color, Urine YELLOW (*)    APPearance HAZY (*)    Hgb urine dipstick SMALL (*)    All other components within normal limits  CK  CBG MONITORING, ED  TROPONIN I (HIGH SENSITIVITY)   ____________________________________________  EKG  ED ECG REPORT I, Arta Silence, the attending physician, personally viewed and interpreted this ECG.  Date: 09/19/2019 EKG Time: 0935 Rate: 68 Rhythm: normal sinus rhythm with PVCs QRS Axis: normal Intervals: normal ST/T Wave abnormalities: normal Narrative Interpretation: no evidence of acute ischemia  ____________________________________________  RADIOLOGY  CXR: No focal infiltrate or edema  ____________________________________________   PROCEDURES  Procedure(s) performed: No  Procedures  Critical Care performed:  No ____________________________________________   INITIAL IMPRESSION / ASSESSMENT AND PLAN / ED COURSE  Pertinent labs & imaging results that were available during my care of the patient were reviewed by me and considered in my medical decision making (see chart for details).  84 year old male with PMH as noted above including CAD, iliac artery aneurysm, and hypertension presents with generalized weakness over the last several days, feeling somewhat unsteady on his feet, and with some subjective fever and sweats a few nights ago which seems to have resolved.  I reviewed the past medical records in epic.  The patient follows with vascular surgery for his iliac artery aneurysm, but has no recent ED visits or admissions.  On exam, he is overall well-appearing for his age.  His vital signs are normal.  The physical exam is otherwise unremarkable.  Neurologic exam is nonfocal.  Initial lab work-up obtained from triage is also unremarkable, including a urinalysis.  Differential includes dehydration, other metabolic etiology, infection, or less likely cardiac cause.  I have added on a chest x-ray, troponin, and CK to the initial labs, and we will give a fluid bolus.  ----------------------------------------- 2:09 PM on 09/19/2019 -----------------------------------------  Chest x-ray shows no acute findings, and the troponin and CK are both within normal limits.  The patient feels comfortable and has a strong desire to go home.  He is stable for discharge at this time.  I counseled him on the results of the work-up.  Although the exact etiology of his symptoms is unclear, there is no indication for further ED work-up or admission.  I instructed him to follow-up with his primary care doctor this week.  Return precautions given, and he and his wife expressed understanding.  ____________________________________________   FINAL CLINICAL IMPRESSION(S) / ED DIAGNOSES  Final diagnoses:   Generalized weakness      NEW MEDICATIONS STARTED DURING THIS VISIT:  New Prescriptions   No medications on file     Note:  This document was prepared using Dragon voice recognition software and may include unintentional dictation errors.    Arta Silence, MD 09/19/19 1410

## 2019-09-19 NOTE — ED Notes (Signed)
Pt presents to the ED for weakness and fever. Pt states he has been feeling like this since Wednesday. Pt states that he felt like he had a fever each night but did not have a thermometer to check it. Pt states at night his fever would break because we would become sweaty. Pt also c/o unsteadiness when he walks. Denies pain, SOB, or NVD. Pt ambulatory from triage and denies being unsteady at this time. Pt A&Ox4, NAD and V/S WNL.

## 2019-09-19 NOTE — ED Triage Notes (Signed)
Pt to ED via POV, pt states that earlier this week he was running fever and feeling weak. Pt states that they were out of town at [employer]. Pt states that he did not check his temp but that he felt like he had fever and during the night was sweating a lot. Pt reports that he did have decreased appetite but that has since returned. Pt states that he does not feel weak anymore but his family wanted him to get checked out. Pt is in NAD.

## 2019-09-19 NOTE — Discharge Instructions (Addendum)
Thank you to rest over the next few days and drink plenty of fluids.  Take all of your medications as prescribed.  Call your doctor tomorrow to follow-up as soon as possible.  Return to the ER for new, worsening, or persistent severe weakness, numbness, fever, vomiting, chest pain, difficulty breathing, feeling lightheaded or like you are about to pass out, or any other new or worsening symptoms that concern you.

## 2019-09-20 ENCOUNTER — Telehealth: Payer: Self-pay | Admitting: Internal Medicine

## 2019-09-20 NOTE — Telephone Encounter (Signed)
Pt's wife called again wanting to talk with you about pt

## 2019-09-20 NOTE — Telephone Encounter (Signed)
Pt's wife Lannette Donath called and asked to speak directly to you about pt. Please call back to advise

## 2019-09-21 NOTE — Telephone Encounter (Signed)
When on the phone with his wife she noted last Tuesday he was vomiting and unable to eat or really drink. No vomiting since last week but still does not have his appetite back completely. The only issue he is having now is generalized weakness but it is not worsening. I just wanted to confirm you were ok to see him in the office. ED notes are in his chart for you to review

## 2019-09-21 NOTE — Telephone Encounter (Signed)
Spoke with patient. He is okay with getting COVID tested. I tried to schedule him an appointment to have a COVID test done. [facility] does not have an appt until Thursday morning. They do not want to go to urgent care to be tested and we will not have the results back if he gets tested onThursday morning and we see him at 12. He said he would do what you recommend. Advised I would call him back and let him know.

## 2019-09-21 NOTE — Telephone Encounter (Signed)
Pt scheduled for virtual with Dr Nicki Reaper and scheduled for covid test Thursday morning. Advised to quarantine. Patient is aware that COVID test results will not be back before appt but someone will call him with results.

## 2019-09-21 NOTE — Telephone Encounter (Signed)
If he desires to have results before appt then can go to one of the places where we can get results.  If desires to schedule appt - I can see him without results or if he desires me to have results - we would need to postpone the appt.

## 2019-09-21 NOTE — Telephone Encounter (Signed)
In reviewing ER notes, he denied vomiting or nausea.  They did report fever and night sweats.  Labs unrevealing.  Was not covid tested.  Was just seen in er 09/19/19.  Given fever, night sweats and now weakness, I feel he does need to be covid tested.  Heart test and labs unrevealling.  Can arrange for covid test. Per our protocol, this would mean visit needed to be changed to virtual.

## 2019-09-21 NOTE — Telephone Encounter (Signed)
Pt was seen in ED for generalized weakness. Wife stated he needs an ED follow up with PCP this week per ED recommendations. Not 100% better but is not having any acute significant symptoms at this time. Pt was advised to take it easy for the next couple of days but he has went back to work. I have scheduled pt for Thursday at 12.

## 2019-09-21 NOTE — Telephone Encounter (Signed)
Patient's wife called this morning. She has not received a call back from yesterday.

## 2019-09-23 ENCOUNTER — Ambulatory Visit: Payer: Medicare HMO | Attending: Internal Medicine

## 2019-09-23 ENCOUNTER — Ambulatory Visit (INDEPENDENT_AMBULATORY_CARE_PROVIDER_SITE_OTHER): Payer: Medicare HMO | Admitting: Internal Medicine

## 2019-09-23 ENCOUNTER — Other Ambulatory Visit: Payer: Self-pay

## 2019-09-23 DIAGNOSIS — D696 Thrombocytopenia, unspecified: Secondary | ICD-10-CM | POA: Diagnosis not present

## 2019-09-23 DIAGNOSIS — D649 Anemia, unspecified: Secondary | ICD-10-CM

## 2019-09-23 DIAGNOSIS — Z20822 Contact with and (suspected) exposure to covid-19: Secondary | ICD-10-CM | POA: Diagnosis not present

## 2019-09-23 DIAGNOSIS — R509 Fever, unspecified: Secondary | ICD-10-CM | POA: Diagnosis not present

## 2019-09-23 NOTE — Progress Notes (Signed)
Patient ID: Brett Riley, male   DOB: May 13, 1935, 84 y.o.   MRN: [id number]   Virtual Visit via telephone Note  This visit type was conducted due to national recommendations for restrictions regarding the COVID-19 pandemic (e.g. social distancing).  This format is felt to be most appropriate for this patient at this time.  All issues noted in this document were discussed and addressed.  No physical exam was performed (except for noted visual exam findings with Video Visits).   I connected with [name] by telephone and verified that I am speaking with the correct person using two identifiers. Location patient: home Location provider: work Persons participating in the telephone visit: patient, provider  The limitations, risks, security and privacy concerns of performing an evaluation and management service by telephone and the availability of in person appointments have been discussed.   It has also been discussed with the patient that there may be a patient responsible charge related to this service. The patient expressed understanding and agreed to proceed.   Reason for visit: work in appt.   HPI: Brett Riley to the beach last week.  Developed fever mid week last week.  Felt some better the following day.  Decreased appetite.  Minimal fever the following day.  Traveled home.  Some nausea.  Some previous headache.  No nasal congestion or chest congestion.  No cough.  No sob.  No abdominal pain.  Bowels moving.  Was seen 09/19/19 in ER.  Presented with above history of symptoms. Had worked out in the yard - [place].  Felt weak.  Unsteady.  Labs and cxr unrevealing. Discharged.  States he is feeling better now.  Eating.  Discussed importance of staying hydrated.  Energy is better.  Still some fatigue, but improved.  Was covid tested this am.  Is in quarantine.     ROS: See pertinent positives and negatives per HPI.  Past Medical History:  Diagnosis Date  . Carotid arterial disease ([place])    a.  02/2016 L CEA; b. 25/9563 U/S: patent LICA, 8-75% RICA.  Marland Kitchen Coronary artery disease    a. 01/2016 MV: mild apical/basal inferior, apical lateral, mid anterolateral, and mid inferolateral ischemia. EF 57%; b. 02/2016 Cath: LM 40ost, LAD 70p/m, 20d, D1 95 (small), LCX nl, RCA 59m, RPDA 90 (small), EF 55-65%-->med Rx. Rec CABG for recurrent symptoms.  . History of echocardiogram    a. 02/2016 Echo: EF 50-55%, no rwma, mild MR.  Marland Kitchen Hypercholesterolemia   . Hypertension     Past Surgical History:  Procedure Laterality Date  . CARDIAC CATHETERIZATION N/A 01/19/2016   Procedure: Left Heart Cath and Coronary Angiography;  Surgeon: Wellington Hampshire, MD;  Location: [facility];  Service: Cardiovascular;  Laterality: N/A;  . ENDARTERECTOMY Left 02/15/2016   Procedure: ENDARTERECTOMY CAROTID;  Surgeon: Algernon Huxley, MD;  Location: [facility];  Service: Vascular;  Laterality: Left;    Family History  Problem Relation Age of Onset  . Heart disease Father        MI  . Heart attack Father   . Stroke Mother   . Breast cancer Sister   . Colon cancer Neg Hx   . Prostate cancer Neg Hx     SOCIAL HX: reviewed.    Current Outpatient Medications:  .  aspirin 81 MG tablet, Take 81 mg by mouth at bedtime. , Disp: , Rfl:  .  clopidogrel (PLAVIX) 75 MG tablet, TAKE 1 TABLET (75 MG TOTAL) BY MOUTH DAILY WITH  BREAKFAST., Disp: 90 tablet, Rfl: 1 .  lisinopril (ZESTRIL) 10 MG tablet, TAKE 1 TABLET BY MOUTH EVERY DAY (Patient taking differently: Take 10 mg by mouth daily. ), Disp: 90 tablet, Rfl: 1 .  lisinopril (ZESTRIL) 20 MG tablet, Take 1 tablet (20 mg total) by mouth daily., Disp: 90 tablet, Rfl: 3 .  metoprolol succinate (TOPROL-XL) 100 MG 24 hr tablet, TAKE 1 TABLET BY MOUTH EVERY DAY WITH A MEAL (Patient taking differently: Take 100 mg by mouth daily. TAKE 1 TABLET BY MOUTH EVERY DAY WITH A MEAL), Disp: 90 tablet, Rfl: 2 .  Multiple Vitamins-Iron (MULTIVITAMINS WITH IRON) TABS, Take 1 tablet by  mouth daily., Disp: , Rfl:  .  nitroGLYCERIN (NITROSTAT) 0.4 MG SL tablet, Place 1 tablet (0.4 mg total) under the tongue every 5 (five) minutes as needed. (Patient not taking: Reported on 06/01/2019), Disp: 25 tablet, Rfl: 6 .  rosuvastatin (CRESTOR) 10 MG tablet, Take 1 tablet (10 mg total) by mouth daily., Disp: 90 tablet, Rfl: 3 .  vitamin B-12 (CYANOCOBALAMIN) 1000 MCG tablet, Take 1,000 mcg by mouth daily., Disp: , Rfl:   EXAM:  GENERAL: alert.  Sounds to be in no acute distress.  Answering questions appropriately.    PSYCH/NEURO: pleasant and cooperative, no obvious depression or anxiety, speech and thought processing grossly intact  ASSESSMENT AND PLAN:  Discussed the following assessment and plan:  Thrombocytopenia (HCC) Platelet count stable on recent check 130s.  hgb and wbc count wnl.  Follow.   Fever Fever with associated weakness.  covid tested today.  Feeling better.  No fever now.  Energy improving.  Stay hydrated.  Rest.  Follow.  Continue quarantine.  Follow.    Anemia Recent cbc - hgb wnl.     I discussed the assessment and treatment plan with the patient. The patient was provided an opportunity to ask questions and all were answered. The patient agreed with the plan and demonstrated an understanding of the instructions.   The patient was advised to call back or seek an in-person evaluation if the symptoms worsen or if the condition fails to improve as anticipated.  I provided 24 minutes of non-face-to-face time during this encounter.   Einar Pheasant, MD

## 2019-09-24 LAB — NOVEL CORONAVIRUS, NAA: SARS-CoV-2, NAA: NOT DETECTED

## 2019-09-24 LAB — SARS-COV-2, NAA 2 DAY TAT

## 2019-09-25 ENCOUNTER — Telehealth: Payer: Self-pay | Admitting: Internal Medicine

## 2019-09-25 NOTE — Telephone Encounter (Signed)
Called pt and notified of negative covid test results.  He is feeling well.  Some fatigue, but denies any sob, chest pain or chest tightness.  No cough or congestion.  Discussed quarantine.  Will call with update.

## 2019-09-29 DIAGNOSIS — Z859 Personal history of malignant neoplasm, unspecified: Secondary | ICD-10-CM | POA: Diagnosis not present

## 2019-09-29 DIAGNOSIS — Z872 Personal history of diseases of the skin and subcutaneous tissue: Secondary | ICD-10-CM | POA: Diagnosis not present

## 2019-09-29 DIAGNOSIS — Z85828 Personal history of other malignant neoplasm of skin: Secondary | ICD-10-CM | POA: Diagnosis not present

## 2019-09-29 DIAGNOSIS — L578 Other skin changes due to chronic exposure to nonionizing radiation: Secondary | ICD-10-CM | POA: Diagnosis not present

## 2019-09-29 DIAGNOSIS — L570 Actinic keratosis: Secondary | ICD-10-CM | POA: Diagnosis not present

## 2019-10-03 ENCOUNTER — Encounter: Payer: Self-pay | Admitting: Internal Medicine

## 2019-10-03 DIAGNOSIS — R509 Fever, unspecified: Secondary | ICD-10-CM | POA: Insufficient documentation

## 2019-10-03 NOTE — Assessment & Plan Note (Signed)
Platelet count stable on recent check 130s.  hgb and wbc count wnl.  Follow.

## 2019-10-03 NOTE — Assessment & Plan Note (Signed)
Fever with associated weakness.  covid tested today.  Feeling better.  No fever now.  Energy improving.  Stay hydrated.  Rest.  Follow.  Continue quarantine.  Follow.

## 2019-10-03 NOTE — Assessment & Plan Note (Signed)
Recent cbc - hgb wnl.  °

## 2019-10-07 ENCOUNTER — Telehealth (INDEPENDENT_AMBULATORY_CARE_PROVIDER_SITE_OTHER): Payer: Medicare HMO | Admitting: Internal Medicine

## 2019-10-07 VITALS — BP 118/67 | Ht 72.0 in | Wt 195.0 lb

## 2019-10-07 DIAGNOSIS — I2510 Atherosclerotic heart disease of native coronary artery without angina pectoris: Secondary | ICD-10-CM | POA: Diagnosis not present

## 2019-10-07 DIAGNOSIS — D696 Thrombocytopenia, unspecified: Secondary | ICD-10-CM | POA: Diagnosis not present

## 2019-10-07 DIAGNOSIS — I6523 Occlusion and stenosis of bilateral carotid arteries: Secondary | ICD-10-CM | POA: Diagnosis not present

## 2019-10-07 DIAGNOSIS — R5383 Other fatigue: Secondary | ICD-10-CM | POA: Diagnosis not present

## 2019-10-07 DIAGNOSIS — D649 Anemia, unspecified: Secondary | ICD-10-CM

## 2019-10-07 DIAGNOSIS — Z125 Encounter for screening for malignant neoplasm of prostate: Secondary | ICD-10-CM | POA: Diagnosis not present

## 2019-10-07 DIAGNOSIS — E7800 Pure hypercholesterolemia, unspecified: Secondary | ICD-10-CM

## 2019-10-07 DIAGNOSIS — I10 Essential (primary) hypertension: Secondary | ICD-10-CM | POA: Diagnosis not present

## 2019-10-07 DIAGNOSIS — I723 Aneurysm of iliac artery: Secondary | ICD-10-CM | POA: Diagnosis not present

## 2019-10-07 NOTE — Assessment & Plan Note (Signed)
Followed by [facility].  Measures 1.6 cm.  Stable.  Recommended yearly f/u.

## 2019-10-07 NOTE — Assessment & Plan Note (Signed)
Previous cath revealed 2 vessel CAD.  On crestor.  Continue risk factor modification.  Stable.

## 2019-10-07 NOTE — Assessment & Plan Note (Signed)
Recent hgb wnl.  

## 2019-10-07 NOTE — Assessment & Plan Note (Signed)
On crestor.  Low cholesterol diet and exercise.  Follow lipid panel and liver function tests.   

## 2019-10-07 NOTE — Assessment & Plan Note (Signed)
Recent fatigue, decreased appetite and weakness.  Also had associated fever.  See previous note.  Better now.  Feels back to normal.  Follow.

## 2019-10-07 NOTE — Assessment & Plan Note (Signed)
Platelet count stable on recent check.  Follow cbc.

## 2019-10-07 NOTE — Assessment & Plan Note (Signed)
Blood pressure as outlined.  Continue on lisinopril and metoprolol.  Follow pressures.  Follow metabolic panel.

## 2019-10-07 NOTE — Assessment & Plan Note (Signed)
S/p left carotid endarterectomy.  Followed by [facility].  Last check 01/2019.  Stable.  Continues yearly f/u.

## 2019-10-07 NOTE — Progress Notes (Signed)
Patient ID: Brett Riley, male   DOB: 01-14-1936, 84 y.o.   MRN: [id number]   Virtual Visit via telephone Note  This visit type was conducted due to national recommendations for restrictions regarding the COVID-19 pandemic (e.g. social distancing).  This format is felt to be most appropriate for this patient at this time.  All issues noted in this document were discussed and addressed.  No physical exam was performed (except for noted visual exam findings with Video Visits).   I connected with Brett Riley by telephone and verified that I am speaking with the correct person using two identifiers. Location patient: home Location provider: work  Persons participating in the telephone visit: patient, provider  The limitations, risks, security and privacy concerns of performing an evaluation and management service by telephone and the availability of in person appointments have been discussed.  It has also been discussed with the patient that there may be a patient responsible charge related to this service. The patient expressed understanding and agreed to proceed.   Reason for visit: scheduled follow up.    HPI: Was recently evaluated 09/19/19 in ER for decreased appetite and weakness.  Hd f/u with me 09/23/19.  covid negative.  Possible viral syndrome with some associated mild volume depletion.  He is feeling better now.  States he feels back to his baseline.  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  Discussed importance of staying hydrated.  No chest pain or sob reported.  Eating.  No nausea or vomiting.  Bowels moving.  Blood pressure last night 118/67.  Does spot check his pressure.  Taking medication regularly.  Sees [facility] for f/u of iliac artery aneurysm and carotids.  Stable.  Reports no dizziness or light headedness.    ROS: See pertinent positives and negatives per HPI.  Past Medical History:  Diagnosis Date  . Carotid arterial disease ([place])    a. 02/2016 L CEA; b.  93/7169 U/S: patent LICA, 6-78% RICA.  Marland Kitchen Coronary artery disease    a. 01/2016 MV: mild apical/basal inferior, apical lateral, mid anterolateral, and mid inferolateral ischemia. EF 57%; b. 02/2016 Cath: LM 40ost, LAD 70p/m, 20d, D1 95 (small), LCX nl, RCA 72m, RPDA 90 (small), EF 55-65%-->med Rx. Rec CABG for recurrent symptoms.  . History of echocardiogram    a. 02/2016 Echo: EF 50-55%, no rwma, mild MR.  Marland Kitchen Hypercholesterolemia   . Hypertension     Past Surgical History:  Procedure Laterality Date  . CARDIAC CATHETERIZATION N/A 01/19/2016   Procedure: Left Heart Cath and Coronary Angiography;  Surgeon: Wellington Hampshire, MD;  Location: [facility];  Service: Cardiovascular;  Laterality: N/A;  . ENDARTERECTOMY Left 02/15/2016   Procedure: ENDARTERECTOMY CAROTID;  Surgeon: Algernon Huxley, MD;  Location: [facility];  Service: Vascular;  Laterality: Left;    Family History  Problem Relation Age of Onset  . Heart disease Father        MI  . Heart attack Father   . Stroke Mother   . Breast cancer Sister   . Colon cancer Neg Hx   . Prostate cancer Neg Hx     SOCIAL HX: reviewed.    Current Outpatient Medications:  .  aspirin 81 MG tablet, Take 81 mg by mouth at bedtime. , Disp: , Rfl:  .  clopidogrel (PLAVIX) 75 MG tablet, TAKE 1 TABLET (75 MG TOTAL) BY MOUTH DAILY WITH BREAKFAST., Disp: 90 tablet, Rfl: 1 .  lisinopril (  ZESTRIL) 10 MG tablet, TAKE 1 TABLET BY MOUTH EVERY DAY (Patient taking differently: Take 10 mg by mouth daily. ), Disp: 90 tablet, Rfl: 1 .  lisinopril (ZESTRIL) 20 MG tablet, Take 1 tablet (20 mg total) by mouth daily., Disp: 90 tablet, Rfl: 3 .  metoprolol succinate (TOPROL-XL) 100 MG 24 hr tablet, TAKE 1 TABLET BY MOUTH EVERY DAY WITH A MEAL (Patient taking differently: Take 100 mg by mouth daily. TAKE 1 TABLET BY MOUTH EVERY DAY WITH A MEAL), Disp: 90 tablet, Rfl: 2 .  Multiple Vitamins-Iron (MULTIVITAMINS WITH IRON) TABS, Take 1 tablet by mouth daily., Disp: ,  Rfl:  .  nitroGLYCERIN (NITROSTAT) 0.4 MG SL tablet, Place 1 tablet (0.4 mg total) under the tongue every 5 (five) minutes as needed. (Patient not taking: Reported on 06/01/2019), Disp: 25 tablet, Rfl: 6 .  rosuvastatin (CRESTOR) 10 MG tablet, Take 1 tablet (10 mg total) by mouth daily., Disp: 90 tablet, Rfl: 3 .  vitamin B-12 (CYANOCOBALAMIN) 1000 MCG tablet, Take 1,000 mcg by mouth daily., Disp: , Rfl:   EXAM:  VITALS per patient if applicable: 338/25  GENERAL: alert.  Sounds to be in no acute distress.  Answering questions appropriately.    PSYCH/NEURO: pleasant and cooperative, no obvious depression or anxiety, speech and thought processing grossly intact  ASSESSMENT AND PLAN:  Discussed the following assessment and plan:  Thrombocytopenia (HCC) Platelet count stable on recent check.  Follow cbc.   Hypercholesterolemia On crestor.  Low cholesterol diet and exercise.  Follow lipid panel and liver function tests.   Fatigue Recent fatigue, decreased appetite and weakness.  Also had associated fever.  See previous note.  Better now.  Feels back to normal.  Follow.    Essential hypertension, benign Blood pressure as outlined.  Continue on lisinopril and metoprolol.  Follow pressures.  Follow metabolic panel.    Carotid stenosis S/p left carotid endarterectomy.  Followed by [facility].  Last check 01/2019.  Stable.  Continues yearly f/u.   Aneurysm artery, iliac common (HCC) Followed by [facility].  Measures 1.6 cm.  Stable.  Recommended yearly f/u.    Anemia Recent hgb wnl.    CAD (coronary artery disease) Previous cath revealed 2 vessel CAD.  On crestor.  Continue risk factor modification.  Stable.     Orders Placed This Encounter  Procedures  . CBC with Differential/Platelet    Standing Status:   Future    Standing Expiration Date:   10/06/2020  . Hepatic function panel    Standing Status:   Future    Standing Expiration Date:   10/06/2020  . Lipid panel    Standing Status:    Future    Standing Expiration Date:   10/06/2020  . Basic metabolic panel    Standing Status:   Future    Standing Expiration Date:   10/06/2020  . PSA, Medicare    Standing Status:   Future    Standing Expiration Date:   10/06/2020     I discussed the assessment and treatment plan with the patient. The patient was provided an opportunity to ask questions and all were answered. The patient agreed with the plan and demonstrated an understanding of the instructions.   The patient was advised to call back or seek an in-person evaluation if the symptoms worsen or if the condition fails to improve as anticipated.  I provided 15 minutes of non-face-to-face time during this encounter.   Einar Pheasant, MD

## 2019-11-30 DIAGNOSIS — L570 Actinic keratosis: Secondary | ICD-10-CM | POA: Diagnosis not present

## 2019-11-30 DIAGNOSIS — L298 Other pruritus: Secondary | ICD-10-CM | POA: Diagnosis not present

## 2019-12-08 ENCOUNTER — Other Ambulatory Visit (INDEPENDENT_AMBULATORY_CARE_PROVIDER_SITE_OTHER): Payer: Medicare HMO

## 2019-12-08 ENCOUNTER — Other Ambulatory Visit: Payer: Self-pay

## 2019-12-08 DIAGNOSIS — D696 Thrombocytopenia, unspecified: Secondary | ICD-10-CM

## 2019-12-08 DIAGNOSIS — Z125 Encounter for screening for malignant neoplasm of prostate: Secondary | ICD-10-CM | POA: Diagnosis not present

## 2019-12-08 DIAGNOSIS — I10 Essential (primary) hypertension: Secondary | ICD-10-CM

## 2019-12-08 DIAGNOSIS — E7800 Pure hypercholesterolemia, unspecified: Secondary | ICD-10-CM

## 2019-12-08 LAB — CBC WITH DIFFERENTIAL/PLATELET
Basophils Absolute: 0 10*3/uL (ref 0.0–0.1)
Basophils Relative: 0.3 % (ref 0.0–3.0)
Eosinophils Absolute: 0.2 10*3/uL (ref 0.0–0.7)
Eosinophils Relative: 4.8 % (ref 0.0–5.0)
HCT: 36.7 % — ABNORMAL LOW (ref 39.0–52.0)
Hemoglobin: 12.5 g/dL — ABNORMAL LOW (ref 13.0–17.0)
Lymphocytes Relative: 36.9 % (ref 12.0–46.0)
Lymphs Abs: 1.8 10*3/uL (ref 0.7–4.0)
MCHC: 33.9 g/dL (ref 30.0–36.0)
MCV: 96.7 fl (ref 78.0–100.0)
Monocytes Absolute: 0.3 10*3/uL (ref 0.1–1.0)
Monocytes Relative: 7.2 % (ref 3.0–12.0)
Neutro Abs: 2.4 10*3/uL (ref 1.4–7.7)
Neutrophils Relative %: 50.8 % (ref 43.0–77.0)
Platelets: 145 10*3/uL — ABNORMAL LOW (ref 150.0–400.0)
RBC: 3.79 Mil/uL — ABNORMAL LOW (ref 4.22–5.81)
RDW: 13.5 % (ref 11.5–15.5)
WBC: 4.8 10*3/uL (ref 4.0–10.5)

## 2019-12-08 LAB — PSA, MEDICARE: PSA: 0.8 ng/mL (ref 0.10–4.00)

## 2019-12-08 LAB — LIPID PANEL
Cholesterol: 104 mg/dL (ref 0–200)
HDL: 34.2 mg/dL — ABNORMAL LOW
LDL Cholesterol: 56 mg/dL (ref 0–99)
NonHDL: 69.94
Total CHOL/HDL Ratio: 3
Triglycerides: 72 mg/dL (ref 0.0–149.0)
VLDL: 14.4 mg/dL (ref 0.0–40.0)

## 2019-12-08 LAB — BASIC METABOLIC PANEL WITH GFR
BUN: 28 mg/dL — ABNORMAL HIGH (ref 6–23)
CO2: 29 meq/L (ref 19–32)
Calcium: 9.2 mg/dL (ref 8.4–10.5)
Chloride: 104 meq/L (ref 96–112)
Creatinine, Ser: 1.1 mg/dL (ref 0.40–1.50)
GFR: 62.21 mL/min
Glucose, Bld: 93 mg/dL (ref 70–99)
Potassium: 4.2 meq/L (ref 3.5–5.1)
Sodium: 139 meq/L (ref 135–145)

## 2019-12-08 LAB — HEPATIC FUNCTION PANEL
ALT: 14 U/L (ref 0–53)
AST: 19 U/L (ref 0–37)
Albumin: 4.2 g/dL (ref 3.5–5.2)
Alkaline Phosphatase: 63 U/L (ref 39–117)
Bilirubin, Direct: 0.1 mg/dL (ref 0.0–0.3)
Total Bilirubin: 0.6 mg/dL (ref 0.2–1.2)
Total Protein: 6.7 g/dL (ref 6.0–8.3)

## 2019-12-10 ENCOUNTER — Other Ambulatory Visit: Payer: Self-pay | Admitting: Internal Medicine

## 2019-12-10 DIAGNOSIS — D649 Anemia, unspecified: Secondary | ICD-10-CM

## 2019-12-10 NOTE — Progress Notes (Signed)
Order placed for f/u labs.  

## 2020-01-04 DIAGNOSIS — L570 Actinic keratosis: Secondary | ICD-10-CM | POA: Diagnosis not present

## 2020-01-04 DIAGNOSIS — L298 Other pruritus: Secondary | ICD-10-CM | POA: Diagnosis not present

## 2020-01-05 ENCOUNTER — Other Ambulatory Visit: Payer: Self-pay

## 2020-01-05 ENCOUNTER — Other Ambulatory Visit (INDEPENDENT_AMBULATORY_CARE_PROVIDER_SITE_OTHER): Payer: Medicare HMO

## 2020-01-05 DIAGNOSIS — D649 Anemia, unspecified: Secondary | ICD-10-CM | POA: Diagnosis not present

## 2020-01-05 LAB — CBC WITH DIFFERENTIAL/PLATELET
Basophils Absolute: 0 10*3/uL (ref 0.0–0.1)
Basophils Relative: 0.5 % (ref 0.0–3.0)
Eosinophils Absolute: 0.2 10*3/uL (ref 0.0–0.7)
Eosinophils Relative: 3.9 % (ref 0.0–5.0)
HCT: 39.1 % (ref 39.0–52.0)
Hemoglobin: 13.3 g/dL (ref 13.0–17.0)
Lymphocytes Relative: 38.4 % (ref 12.0–46.0)
Lymphs Abs: 2.2 10*3/uL (ref 0.7–4.0)
MCHC: 34 g/dL (ref 30.0–36.0)
MCV: 95.8 fl (ref 78.0–100.0)
Monocytes Absolute: 0.4 10*3/uL (ref 0.1–1.0)
Monocytes Relative: 7.2 % (ref 3.0–12.0)
Neutro Abs: 2.9 10*3/uL (ref 1.4–7.7)
Neutrophils Relative %: 50 % (ref 43.0–77.0)
Platelets: 154 10*3/uL (ref 150.0–400.0)
RBC: 4.08 Mil/uL — ABNORMAL LOW (ref 4.22–5.81)
RDW: 13.7 % (ref 11.5–15.5)
WBC: 5.7 10*3/uL (ref 4.0–10.5)

## 2020-01-05 LAB — IBC + FERRITIN
Ferritin: 180 ng/mL (ref 22.0–322.0)
Iron: 70 ug/dL (ref 42–165)
Saturation Ratios: 21 % (ref 20.0–50.0)
Transferrin: 238 mg/dL (ref 212.0–360.0)

## 2020-01-05 LAB — VITAMIN B12: Vitamin B-12: 728 pg/mL (ref 211–911)

## 2020-01-06 ENCOUNTER — Other Ambulatory Visit: Payer: Medicare HMO

## 2020-01-06 DIAGNOSIS — R69 Illness, unspecified: Secondary | ICD-10-CM | POA: Diagnosis not present

## 2020-01-10 ENCOUNTER — Ambulatory Visit (INDEPENDENT_AMBULATORY_CARE_PROVIDER_SITE_OTHER): Payer: Medicare HMO | Admitting: Internal Medicine

## 2020-01-10 ENCOUNTER — Ambulatory Visit (INDEPENDENT_AMBULATORY_CARE_PROVIDER_SITE_OTHER): Payer: Medicare HMO

## 2020-01-10 ENCOUNTER — Other Ambulatory Visit: Payer: Self-pay

## 2020-01-10 VITALS — BP 130/72 | HR 58 | Temp 98.4°F | Resp 16 | Ht 72.0 in | Wt 197.6 lb

## 2020-01-10 DIAGNOSIS — Z23 Encounter for immunization: Secondary | ICD-10-CM

## 2020-01-10 DIAGNOSIS — D696 Thrombocytopenia, unspecified: Secondary | ICD-10-CM

## 2020-01-10 DIAGNOSIS — I6523 Occlusion and stenosis of bilateral carotid arteries: Secondary | ICD-10-CM | POA: Diagnosis not present

## 2020-01-10 DIAGNOSIS — M79605 Pain in left leg: Secondary | ICD-10-CM

## 2020-01-10 DIAGNOSIS — I723 Aneurysm of iliac artery: Secondary | ICD-10-CM | POA: Diagnosis not present

## 2020-01-10 DIAGNOSIS — M79604 Pain in right leg: Secondary | ICD-10-CM | POA: Diagnosis not present

## 2020-01-10 DIAGNOSIS — Z0000 Encounter for general adult medical examination without abnormal findings: Secondary | ICD-10-CM

## 2020-01-10 DIAGNOSIS — I2510 Atherosclerotic heart disease of native coronary artery without angina pectoris: Secondary | ICD-10-CM

## 2020-01-10 DIAGNOSIS — M79606 Pain in leg, unspecified: Secondary | ICD-10-CM | POA: Insufficient documentation

## 2020-01-10 DIAGNOSIS — E7800 Pure hypercholesterolemia, unspecified: Secondary | ICD-10-CM | POA: Diagnosis not present

## 2020-01-10 DIAGNOSIS — M5136 Other intervertebral disc degeneration, lumbar region: Secondary | ICD-10-CM | POA: Diagnosis not present

## 2020-01-10 DIAGNOSIS — D649 Anemia, unspecified: Secondary | ICD-10-CM | POA: Diagnosis not present

## 2020-01-10 DIAGNOSIS — I700 Atherosclerosis of aorta: Secondary | ICD-10-CM | POA: Diagnosis not present

## 2020-01-10 DIAGNOSIS — I10 Essential (primary) hypertension: Secondary | ICD-10-CM

## 2020-01-10 DIAGNOSIS — M4186 Other forms of scoliosis, lumbar region: Secondary | ICD-10-CM | POA: Diagnosis not present

## 2020-01-10 IMAGING — DX DG LUMBAR SPINE 2-3V
3 series · 3 of 3 positions shown · non-contrast
Comparison: None.

CLINICAL DATA: Bilateral leg and thigh pain, right greater than
left.

EXAM:
LUMBAR SPINE - 2-3 VIEW

[lumbar spine ap]
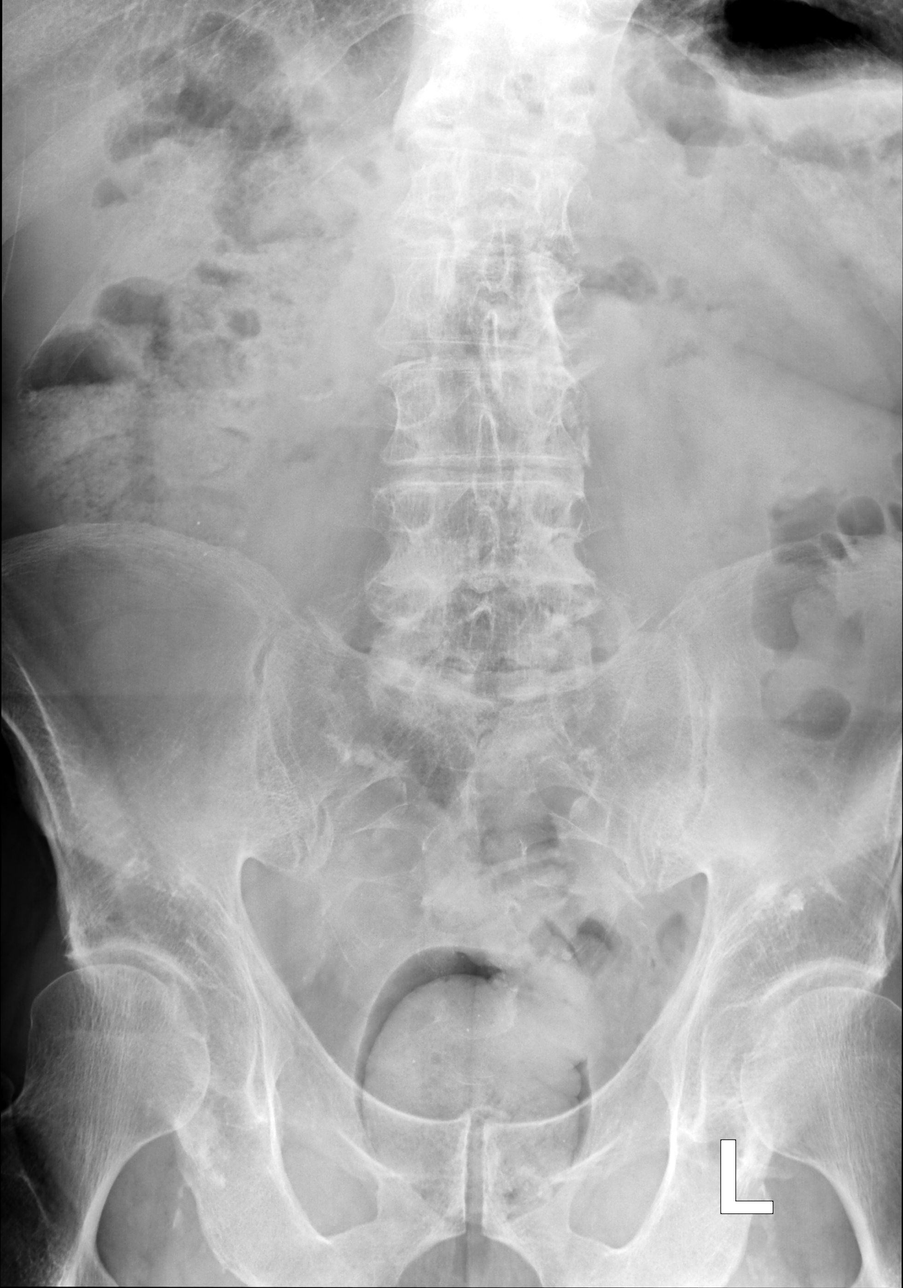

[lumbar spine lat]
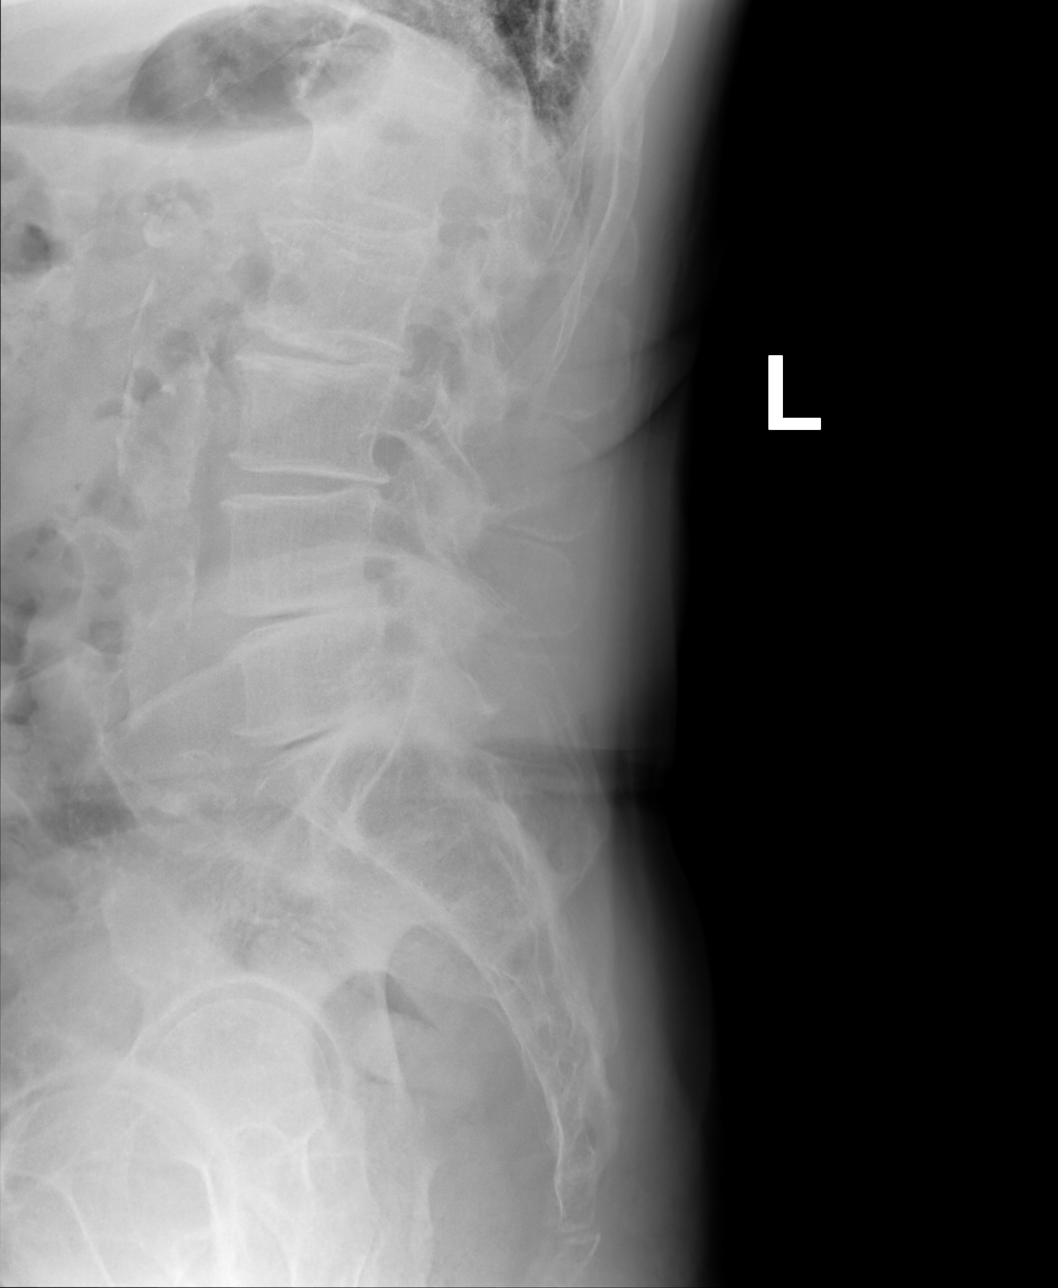

[lumbar spot lat]
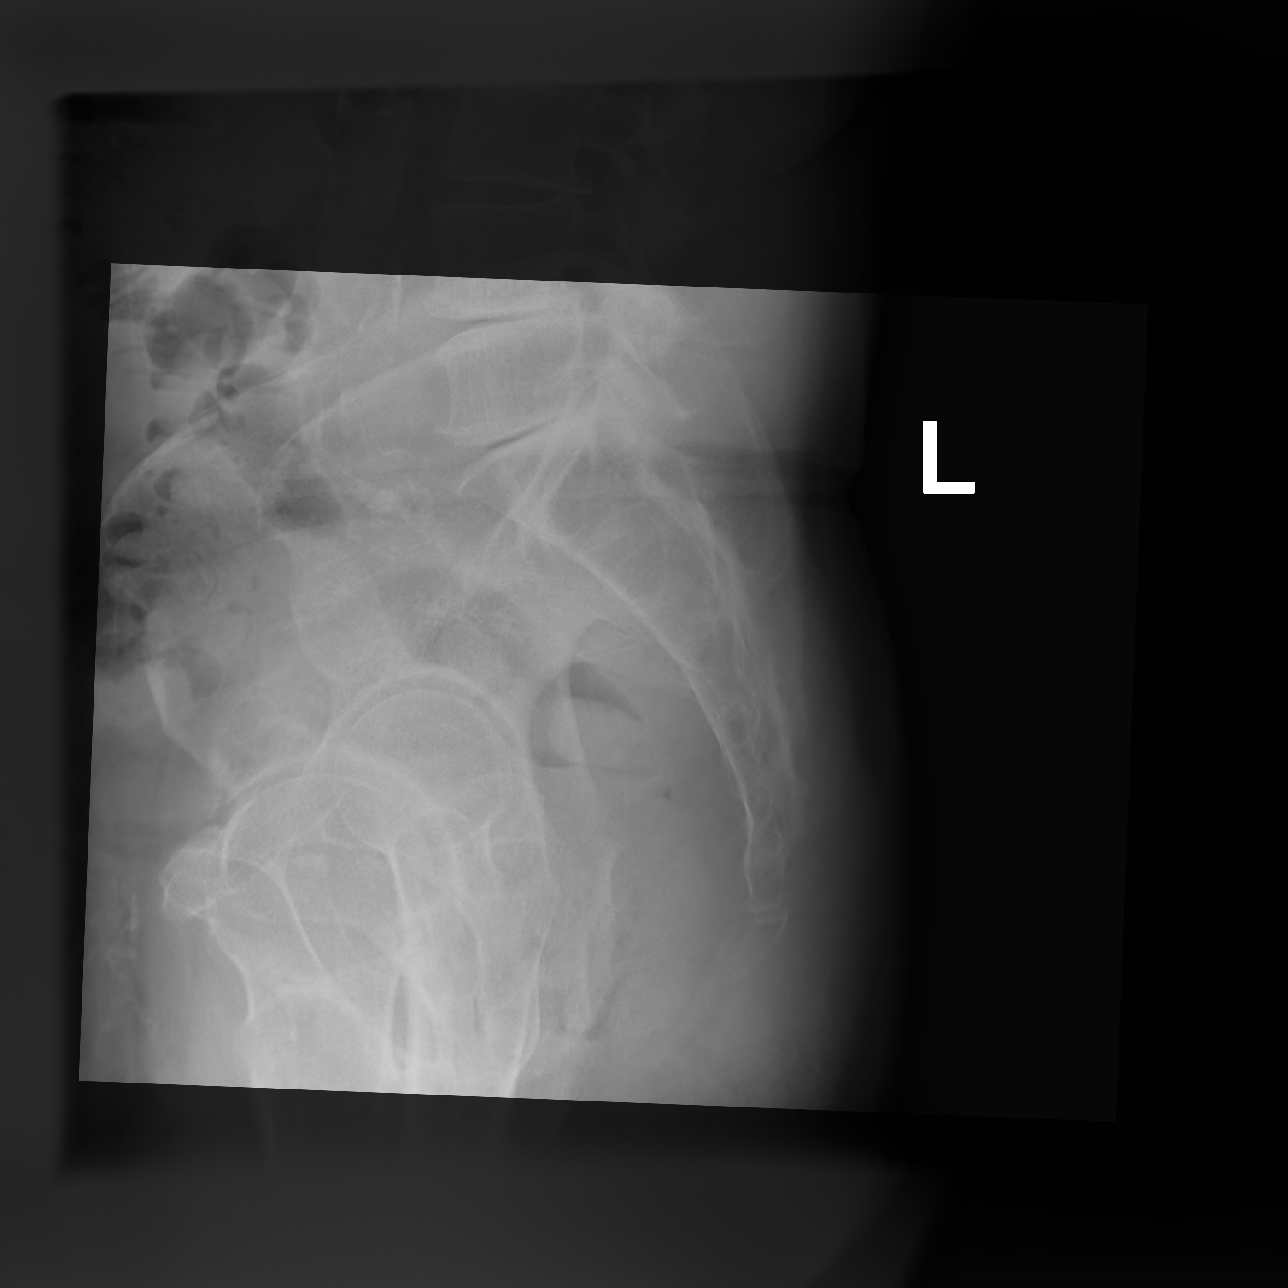

[3 of 3 positions shown; findings below may reference images not displayed]

FINDINGS: There are 5 non rib-bearing lumbar type vertebral bodies.

Mild scoliotic curvature of the thoracolumbar spine with caudal
component convex the left measuring at least 8 degrees (as measured
from the superior endplate of T12 to the inferior endplate of L4).
Straightening of the expected lumbar lordosis. No anterolisthesis or
retrolisthesis.

Lumbar vertebral body heights are preserved.

Moderate to severe multilevel lumbar spine DDD, worse at L2-L3,
L4-L5 and L5-S1 with disc space height loss, endplate irregularity
and small posteriorly directed disc osteophyte complexes at these
locations

Limited visualization the bilateral SI joints is normal. Mild
degenerative change of the bilateral hips is suspected though
incompletely evaluated. A bone island overlies the left acetabulum.
Vascular calcifications are seen within the abdominal aorta and
pelvic vasculature. Nonobstructive bowel gas pattern.
IMPRESSION: 1. Moderate to severe multilevel lumbar spine DDD, worse at L2-L3,
L4-L5 and L5-S1.
2.  Aortic Atherosclerosis ([R4]-[R4]).

## 2020-01-10 NOTE — Progress Notes (Signed)
Patient ID: Brett Riley, male   DOB: 1935/10/27, 84 y.o.   MRN: [id number]   Subjective:    Patient ID: Brett Riley, male    DOB: [birth year], 84 y.o.   MRN: [id number]  HPI This visit occurred during the SARS-CoV-2 public health emergency.  Safety protocols were in place, including screening questions prior to the visit, additional usage of staff PPE, and extensive cleaning of exam room while observing appropriate contact time as indicated for disinfecting solutions.  Patient here for his physical exam.  Increased stress with his work.  Discussed.  Overall he feels he is handling things relatively well.  Stays active.  No chest pain or sob.  No acid reflux.  No abdominal pain or bowel change.  Is having bilateral thigh aching - right greater.  When lying flat on his back - better.  Started 2-3 months ago.  Lying on side - aggravates.  No urine or bowel change.    Past Medical History:  Diagnosis Date  . Carotid arterial disease ([place])    a. 02/2016 L CEA; b. 97/9892 U/S: patent LICA, 1-19% RICA.  Marland Kitchen Coronary artery disease    a. 01/2016 MV: mild apical/basal inferior, apical lateral, mid anterolateral, and mid inferolateral ischemia. EF 57%; b. 02/2016 Cath: LM 40ost, LAD 70p/m, 20d, D1 95 (small), LCX nl, RCA 6m, RPDA 90 (small), EF 55-65%-->med Rx. Rec CABG for recurrent symptoms.  . History of echocardiogram    a. 02/2016 Echo: EF 50-55%, no rwma, mild MR.  Marland Kitchen Hypercholesterolemia   . Hypertension    Past Surgical History:  Procedure Laterality Date  . CARDIAC CATHETERIZATION N/A 01/19/2016   Procedure: Left Heart Cath and Coronary Angiography;  Surgeon: Wellington Hampshire, MD;  Location: [facility];  Service: Cardiovascular;  Laterality: N/A;  . ENDARTERECTOMY Left 02/15/2016   Procedure: ENDARTERECTOMY CAROTID;  Surgeon: Algernon Huxley, MD;  Location: [facility];  Service: Vascular;  Laterality: Left;   Family History  Problem Relation Age of Onset  . Heart disease Father          MI  . Heart attack Father   . Stroke Mother   . Breast cancer Sister   . Colon cancer Neg Hx   . Prostate cancer Neg Hx    Social History   Socioeconomic History  . Marital status: Married    Spouse name: Not on file  . Number of children: 1  . Years of education: Not on file  . Highest education level: Not on file  Occupational History    Employer: [employer]  Tobacco Use  . Smoking status: Never Smoker  . Smokeless tobacco: Never Used  Vaping Use  . Vaping Use: Never used  Substance and Sexual Activity  . Alcohol use: No    Alcohol/week: 0.0 standard drinks  . Drug use: No  . Sexual activity: Not Currently  Other Topics Concern  . Not on file  Social History Narrative  . Not on file   Social Determinants of Health   Financial Resource Strain:   . Difficulty of Paying Living Expenses: Not on file  Food Insecurity:   . Worried About Charity fundraiser in the Last Year: Not on file  . Ran Out of Food in the Last Year: Not on file  Transportation Needs:   . Lack of Transportation (Medical): Not on file  . Lack of Transportation (Non-Medical): Not on file  Physical Activity:   . Days of Exercise per  Week: Not on file  . Minutes of Exercise per Session: Not on file  Stress:   . Feeling of Stress : Not on file  Social Connections:   . Frequency of Communication with Friends and Family: Not on file  . Frequency of Social Gatherings with Friends and Family: Not on file  . Attends Religious Services: Not on file  . Active Member of Clubs or Organizations: Not on file  . Attends Archivist Meetings: Not on file  . Marital Status: Not on file    Outpatient Encounter Medications as of 01/10/2020  Medication Sig  . aspirin 81 MG tablet Take 81 mg by mouth at bedtime.   . clopidogrel (PLAVIX) 75 MG tablet TAKE 1 TABLET (75 MG TOTAL) BY MOUTH DAILY WITH BREAKFAST.  Marland Kitchen lisinopril (ZESTRIL) 10 MG tablet TAKE 1 TABLET BY MOUTH EVERY DAY (Patient taking  differently: Take 10 mg by mouth daily. )  . metoprolol succinate (TOPROL-XL) 100 MG 24 hr tablet TAKE 1 TABLET BY MOUTH EVERY DAY WITH A MEAL (Patient taking differently: Take 100 mg by mouth daily. TAKE 1 TABLET BY MOUTH EVERY DAY WITH A MEAL)  . Multiple Vitamins-Iron (MULTIVITAMINS WITH IRON) TABS Take 1 tablet by mouth daily.  . nitroGLYCERIN (NITROSTAT) 0.4 MG SL tablet Place 1 tablet (0.4 mg total) under the tongue every 5 (five) minutes as needed.  . rosuvastatin (CRESTOR) 10 MG tablet Take 1 tablet (10 mg total) by mouth daily.  . vitamin B-12 (CYANOCOBALAMIN) 1000 MCG tablet Take 1,000 mcg by mouth daily.  . [DISCONTINUED] lisinopril (ZESTRIL) 20 MG tablet Take 1 tablet (20 mg total) by mouth daily.   No facility-administered encounter medications on file as of 01/10/2020.    Review of Systems  Constitutional: Negative for appetite change and unexpected weight change.  HENT: Negative for congestion, sinus pressure and sore throat.   Eyes: Negative for pain and visual disturbance.  Respiratory: Negative for cough, chest tightness and shortness of breath.   Cardiovascular: Negative for chest pain, palpitations and leg swelling.  Gastrointestinal: Negative for abdominal pain, diarrhea, nausea and vomiting.  Genitourinary: Negative for difficulty urinating and dysuria.  Musculoskeletal: Negative for joint swelling and myalgias.       Bilateral thigh pain as outlined.    Skin: Negative for color change and rash.  Neurological: Negative for dizziness, light-headedness and headaches.  Hematological: Negative for adenopathy. Does not bruise/bleed easily.  Psychiatric/Behavioral: Negative for agitation and dysphoric mood.       Objective:    Physical Exam Vitals reviewed.  Constitutional:      General: He is not in acute distress.    Appearance: Normal appearance. He is well-developed.  HENT:     Head: Normocephalic and atraumatic.     Right Ear: External ear normal.     Left  Ear: External ear normal.     Mouth/Throat:     Pharynx: No oropharyngeal exudate.  Eyes:     General: No scleral icterus.       Right eye: No discharge.        Left eye: No discharge.     Conjunctiva/sclera: Conjunctivae normal.  Neck:     Thyroid: No thyromegaly.  Cardiovascular:     Rate and Rhythm: Normal rate and regular rhythm.  Pulmonary:     Effort: No respiratory distress.     Breath sounds: Normal breath sounds. No wheezing.  Abdominal:     General: Bowel sounds are normal.     Palpations:  Abdomen is soft.     Tenderness: There is no abdominal tenderness.  Musculoskeletal:        General: No swelling or tenderness.     Cervical back: Neck supple. No tenderness.  Lymphadenopathy:     Cervical: No cervical adenopathy.  Skin:    Findings: No erythema or rash.  Neurological:     Mental Status: He is alert and oriented to person, place, and time.  Psychiatric:        Mood and Affect: Mood normal.        Behavior: Behavior normal.     BP 130/72   Pulse (!) 58   Temp 98.4 F (36.9 C) (Oral)   Resp 16   Ht 6' (1.829 m)   Wt 197 lb 9.6 oz (89.6 kg)   SpO2 99%   BMI 26.80 kg/m  Wt Readings from Last 3 Encounters:  01/10/20 197 lb 9.6 oz (89.6 kg)  10/07/19 195 lb (88.5 kg)  09/23/19 195 lb (88.5 kg)     Lab Results  Component Value Date   WBC 5.7 01/05/2020   HGB 13.3 01/05/2020   HCT 39.1 01/05/2020   PLT 154.0 01/05/2020   GLUCOSE 93 12/08/2019   CHOL 104 12/08/2019   TRIG 72.0 12/08/2019   HDL 34.20 (L) 12/08/2019   LDLCALC 56 12/08/2019   ALT 14 12/08/2019   AST 19 12/08/2019   NA 139 12/08/2019   K 4.2 12/08/2019   CL 104 12/08/2019   CREATININE 1.10 12/08/2019   BUN 28 (H) 12/08/2019   CO2 29 12/08/2019   TSH 2.88 05/10/2019   PSA 0.80 12/08/2019   INR 1.08 02/13/2016    DG Chest 2 View  Result Date: 09/19/2019 CLINICAL DATA:  Intermittent fever and weakness over the past week. Diaphoresis last night. EXAM: CHEST - 2 VIEW COMPARISON:   None. FINDINGS: Lungs clear. Heart size normal. No pneumothorax or pleural effusion. Aortic atherosclerosis. No acute or focal bony abnormality. IMPRESSION: No acute disease. Aortic Atherosclerosis (ICD10-I70.0). Electronically Signed   By: Inge Rise M.D.   On: 09/19/2019 13:06       Assessment & Plan:   Problem List Items Addressed This Visit    Thrombocytopenia ([place])    Saw hematology.  Follow cbc.       Leg pain    Persistent thigh pain as outlined.  Better when lying down.  Check L-S spine xray.  Consider PT.       Relevant Orders   DG Lumbar Spine 2-3 Views (Completed)   Hypercholesterolemia    On crestor.  Low cholesterol diet and exercise.  Follow lipid panel and liver function tests.        Relevant Orders   Hepatic function panel   Lipid panel   Health care maintenance    Physical today 01/10/20.  Colonoscopy 08/2013.        Essential hypertension, benign    Continue lisinopril and metoprolol.  Blood pressure as outlined.  Follow pressures.  Follow metabolic panel.       Relevant Orders   Basic metabolic panel   Carotid stenosis    S/p left endarterectomy.  Continue crestor.  Followed by [facility].       CAD (coronary artery disease)    Previous cath - 2 vessel disease.  Risk factor modification.  Continue crestor.       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  Recommended f/u in one year.  Anemia    Follow cbc.       Relevant Orders   CBC with Differential/Platelet    Other Visit Diagnoses    Need for immunization against influenza    -  Primary   Relevant Orders   Flu Vaccine QUAD High Dose(Fluad) (Completed)       Einar Pheasant, MD

## 2020-01-12 ENCOUNTER — Other Ambulatory Visit: Payer: Self-pay | Admitting: Internal Medicine

## 2020-01-12 DIAGNOSIS — M79604 Pain in right leg: Secondary | ICD-10-CM

## 2020-01-12 DIAGNOSIS — M79605 Pain in left leg: Secondary | ICD-10-CM

## 2020-01-12 NOTE — Progress Notes (Signed)
Order placed for referral to PT

## 2020-01-15 ENCOUNTER — Encounter: Payer: Self-pay | Admitting: Internal Medicine

## 2020-01-15 NOTE — Assessment & Plan Note (Signed)
Previous cath - 2 vessel disease.  Risk factor modification.  Continue crestor.  

## 2020-01-15 NOTE — Assessment & Plan Note (Signed)
Saw hematology.  Follow cbc.

## 2020-01-15 NOTE — Assessment & Plan Note (Signed)
On crestor.  Low cholesterol diet and exercise.  Follow lipid panel and liver function tests.   

## 2020-01-15 NOTE — Assessment & Plan Note (Signed)
S/p left endarterectomy.  Continue crestor.  Followed by [facility].

## 2020-01-15 NOTE — Assessment & Plan Note (Signed)
Follow cbc.  

## 2020-01-15 NOTE — Assessment & Plan Note (Addendum)
Physical today 01/10/20.  Colonoscopy 08/2013.

## 2020-01-15 NOTE — Assessment & Plan Note (Signed)
Followed by [facility].  Recommended f/u in one year.

## 2020-01-15 NOTE — Assessment & Plan Note (Signed)
Continue lisinopril and metoprolol.  Blood pressure as outlined.  Follow pressures.  Follow metabolic panel.

## 2020-01-15 NOTE — Assessment & Plan Note (Signed)
Persistent thigh pain as outlined.  Better when lying down.  Check L-S spine xray.  Consider PT.

## 2020-02-04 DIAGNOSIS — M25552 Pain in left hip: Secondary | ICD-10-CM | POA: Diagnosis not present

## 2020-02-04 DIAGNOSIS — M25551 Pain in right hip: Secondary | ICD-10-CM | POA: Diagnosis not present

## 2020-02-04 DIAGNOSIS — M545 Low back pain, unspecified: Secondary | ICD-10-CM | POA: Diagnosis not present

## 2020-02-09 ENCOUNTER — Other Ambulatory Visit: Payer: Self-pay | Admitting: Internal Medicine

## 2020-02-09 DIAGNOSIS — M25552 Pain in left hip: Secondary | ICD-10-CM | POA: Diagnosis not present

## 2020-02-09 DIAGNOSIS — M25551 Pain in right hip: Secondary | ICD-10-CM | POA: Diagnosis not present

## 2020-02-09 DIAGNOSIS — M545 Low back pain, unspecified: Secondary | ICD-10-CM | POA: Diagnosis not present

## 2020-02-16 DIAGNOSIS — M545 Low back pain, unspecified: Secondary | ICD-10-CM | POA: Diagnosis not present

## 2020-02-16 DIAGNOSIS — M25551 Pain in right hip: Secondary | ICD-10-CM | POA: Diagnosis not present

## 2020-02-16 DIAGNOSIS — M25552 Pain in left hip: Secondary | ICD-10-CM | POA: Diagnosis not present

## 2020-02-23 ENCOUNTER — Other Ambulatory Visit: Payer: Self-pay | Admitting: Internal Medicine

## 2020-02-23 DIAGNOSIS — M25552 Pain in left hip: Secondary | ICD-10-CM | POA: Diagnosis not present

## 2020-02-23 DIAGNOSIS — M545 Low back pain, unspecified: Secondary | ICD-10-CM | POA: Diagnosis not present

## 2020-02-23 DIAGNOSIS — M25551 Pain in right hip: Secondary | ICD-10-CM | POA: Diagnosis not present

## 2020-03-01 DIAGNOSIS — M545 Low back pain, unspecified: Secondary | ICD-10-CM | POA: Diagnosis not present

## 2020-03-01 DIAGNOSIS — M25551 Pain in right hip: Secondary | ICD-10-CM | POA: Diagnosis not present

## 2020-03-01 DIAGNOSIS — M25552 Pain in left hip: Secondary | ICD-10-CM | POA: Diagnosis not present

## 2020-03-26 ENCOUNTER — Other Ambulatory Visit: Payer: Self-pay | Admitting: Family

## 2020-03-27 NOTE — Telephone Encounter (Signed)
LMTCB (ok per DPR) concerning refill of Lisinopril. Medication was changed to 20 mg in January 2021 by Laurann Montana, NP. Do not see in chart where this was decreased back to 10 mg.

## 2020-03-30 NOTE — Telephone Encounter (Signed)
Please schedule 1 year F/U appointment. Thank you!

## 2020-03-30 NOTE — Telephone Encounter (Signed)
Patient previous bottle has rx lisinopril 20 mg po q d   Wife requesting refill.

## 2020-03-30 NOTE — Telephone Encounter (Signed)
Patient returning call.

## 2020-04-12 ENCOUNTER — Other Ambulatory Visit (INDEPENDENT_AMBULATORY_CARE_PROVIDER_SITE_OTHER): Payer: Medicare HMO

## 2020-04-12 ENCOUNTER — Other Ambulatory Visit: Payer: Self-pay

## 2020-04-12 DIAGNOSIS — I10 Essential (primary) hypertension: Secondary | ICD-10-CM

## 2020-04-12 DIAGNOSIS — D649 Anemia, unspecified: Secondary | ICD-10-CM | POA: Diagnosis not present

## 2020-04-12 DIAGNOSIS — E7800 Pure hypercholesterolemia, unspecified: Secondary | ICD-10-CM | POA: Diagnosis not present

## 2020-04-12 LAB — LIPID PANEL
Cholesterol: 104 mg/dL (ref 0–200)
HDL: 39.3 mg/dL
LDL Cholesterol: 47 mg/dL (ref 0–99)
NonHDL: 64.29
Total CHOL/HDL Ratio: 3
Triglycerides: 85 mg/dL (ref 0.0–149.0)
VLDL: 17 mg/dL (ref 0.0–40.0)

## 2020-04-12 LAB — HEPATIC FUNCTION PANEL
ALT: 19 U/L (ref 0–53)
AST: 23 U/L (ref 0–37)
Albumin: 4.5 g/dL (ref 3.5–5.2)
Alkaline Phosphatase: 71 U/L (ref 39–117)
Bilirubin, Direct: 0.1 mg/dL (ref 0.0–0.3)
Total Bilirubin: 0.6 mg/dL (ref 0.2–1.2)
Total Protein: 7.2 g/dL (ref 6.0–8.3)

## 2020-04-12 LAB — BASIC METABOLIC PANEL WITH GFR
BUN: 26 mg/dL — ABNORMAL HIGH (ref 6–23)
CO2: 29 meq/L (ref 19–32)
Calcium: 9.9 mg/dL (ref 8.4–10.5)
Chloride: 104 meq/L (ref 96–112)
Creatinine, Ser: 1.09 mg/dL (ref 0.40–1.50)
GFR: 63.3 mL/min
Glucose, Bld: 96 mg/dL (ref 70–99)
Potassium: 4.6 meq/L (ref 3.5–5.1)
Sodium: 140 meq/L (ref 135–145)

## 2020-04-12 LAB — CBC WITH DIFFERENTIAL/PLATELET
Basophils Absolute: 0 10*3/uL (ref 0.0–0.1)
Basophils Relative: 0.6 % (ref 0.0–3.0)
Eosinophils Absolute: 0.2 10*3/uL (ref 0.0–0.7)
Eosinophils Relative: 4.2 % (ref 0.0–5.0)
HCT: 38.9 % — ABNORMAL LOW (ref 39.0–52.0)
Hemoglobin: 13.3 g/dL (ref 13.0–17.0)
Lymphocytes Relative: 35.9 % (ref 12.0–46.0)
Lymphs Abs: 1.8 10*3/uL (ref 0.7–4.0)
MCHC: 34.2 g/dL (ref 30.0–36.0)
MCV: 95.9 fl (ref 78.0–100.0)
Monocytes Absolute: 0.4 10*3/uL (ref 0.1–1.0)
Monocytes Relative: 8.2 % (ref 3.0–12.0)
Neutro Abs: 2.6 10*3/uL (ref 1.4–7.7)
Neutrophils Relative %: 51.1 % (ref 43.0–77.0)
Platelets: 159 10*3/uL (ref 150.0–400.0)
RBC: 4.06 Mil/uL — ABNORMAL LOW (ref 4.22–5.81)
RDW: 13.4 % (ref 11.5–15.5)
WBC: 5.1 10*3/uL (ref 4.0–10.5)

## 2020-04-14 ENCOUNTER — Other Ambulatory Visit: Payer: Self-pay

## 2020-04-14 ENCOUNTER — Ambulatory Visit (INDEPENDENT_AMBULATORY_CARE_PROVIDER_SITE_OTHER): Payer: Medicare HMO | Admitting: Internal Medicine

## 2020-04-14 DIAGNOSIS — I723 Aneurysm of iliac artery: Secondary | ICD-10-CM | POA: Diagnosis not present

## 2020-04-14 DIAGNOSIS — M79605 Pain in left leg: Secondary | ICD-10-CM

## 2020-04-14 DIAGNOSIS — E7800 Pure hypercholesterolemia, unspecified: Secondary | ICD-10-CM

## 2020-04-14 DIAGNOSIS — D696 Thrombocytopenia, unspecified: Secondary | ICD-10-CM

## 2020-04-14 DIAGNOSIS — M79604 Pain in right leg: Secondary | ICD-10-CM

## 2020-04-14 DIAGNOSIS — I6522 Occlusion and stenosis of left carotid artery: Secondary | ICD-10-CM

## 2020-04-14 DIAGNOSIS — I10 Essential (primary) hypertension: Secondary | ICD-10-CM

## 2020-04-14 DIAGNOSIS — D649 Anemia, unspecified: Secondary | ICD-10-CM

## 2020-04-14 NOTE — Progress Notes (Signed)
Patient ID: Brett Riley, male   DOB: 1935/03/11, 85 y.o.   MRN: [id number]   Subjective:    Patient ID: Brett Riley, male    DOB: Jul 19, 1935, 85 y.o.   MRN: [id number]  HPI This visit occurred during the SARS-CoV-2 public health emergency.  Safety protocols were in place, including screening questions prior to the visit, additional usage of staff PPE, and extensive cleaning of exam room while observing appropriate contact time as indicated for disinfecting solutions.  Patient here for a scheduled follow up.  Here to follow up regarding his blood pressure and cholesterol.  Previously having right thigh/leg pain.  XRAY and PT after last visit.  Is some better.  Still notices issues when lying in certain positions at night.  No pain during the day.  Does not limit his activity.  No back pain.  No abdominal pain.  Bowels moving.  No chest pain or sob.  Handling stress.    Past Medical History:  Diagnosis Date  . Carotid arterial disease ([place])    a. 02/2016 L CEA; b. 25/0539 U/S: patent LICA, 7-67% RICA.  Marland Kitchen Coronary artery disease    a. 01/2016 MV: mild apical/basal inferior, apical lateral, mid anterolateral, and mid inferolateral ischemia. EF 57%; b. 02/2016 Cath: LM 40ost, LAD 70p/m, 20d, D1 95 (small), LCX nl, RCA 14m, RPDA 90 (small), EF 55-65%-->med Rx. Rec CABG for recurrent symptoms.  . History of echocardiogram    a. 02/2016 Echo: EF 50-55%, no rwma, mild MR.  Marland Kitchen Hypercholesterolemia   . Hypertension    Past Surgical History:  Procedure Laterality Date  . CARDIAC CATHETERIZATION N/A 01/19/2016   Procedure: Left Heart Cath and Coronary Angiography;  Surgeon: Wellington Hampshire, MD;  Location: [place] [facility];  Service: Cardiovascular;  Laterality: N/A;  . ENDARTERECTOMY Left 02/15/2016   Procedure: ENDARTERECTOMY CAROTID;  Surgeon: Algernon Huxley, MD;  Location: [facility];  Service: Vascular;  Laterality: Left;   Family History  Problem Relation Age of Onset  . Heart disease  Father        MI  . Heart attack Father   . Stroke Mother   . Breast cancer Sister   . Colon cancer Neg Hx   . Prostate cancer Neg Hx    Social History   Socioeconomic History  . Marital status: Married    Spouse name: Not on file  . Number of children: 1  . Years of education: Not on file  . Highest education level: Not on file  Occupational History    Employer: [employer]  Tobacco Use  . Smoking status: Never Smoker  . Smokeless tobacco: Never Used  Vaping Use  . Vaping Use: Never used  Substance and Sexual Activity  . Alcohol use: No    Alcohol/week: 0.0 standard drinks  . Drug use: No  . Sexual activity: Not Currently  Other Topics Concern  . Not on file  Social History Narrative  . Not on file   Social Determinants of Health   Financial Resource Strain: Not on file  Food Insecurity: Not on file  Transportation Needs: Not on file  Physical Activity: Not on file  Stress: Not on file  Social Connections: Not on file    Outpatient Encounter Medications as of 04/14/2020  Medication Sig  . aspirin 81 MG tablet Take 81 mg by mouth at bedtime.   . clopidogrel (PLAVIX) 75 MG tablet TAKE 1 TABLET (75 MG TOTAL) BY MOUTH DAILY WITH BREAKFAST.  Marland Kitchen  lisinopril (ZESTRIL) 20 MG tablet TAKE 1 TABLET BY MOUTH EVERY DAY  . metoprolol succinate (TOPROL-XL) 100 MG 24 hr tablet TAKE 1 TABLET BY MOUTH EVERY DAY WITH A MEAL  . Multiple Vitamins-Iron (MULTIVITAMINS WITH IRON) TABS Take 1 tablet by mouth daily.  . nitroGLYCERIN (NITROSTAT) 0.4 MG SL tablet Place 1 tablet (0.4 mg total) under the tongue every 5 (five) minutes as needed.  . rosuvastatin (CRESTOR) 10 MG tablet TAKE 1 TABLET BY MOUTH EVERY DAY  . vitamin B-12 (CYANOCOBALAMIN) 1000 MCG tablet Take 1,000 mcg by mouth daily.  . [DISCONTINUED] lisinopril (ZESTRIL) 10 MG tablet TAKE 1 TABLET BY MOUTH EVERY DAY (Patient taking differently: Take 10 mg by mouth daily. )   No facility-administered encounter medications on file as  of 04/14/2020.    Review of Systems  Constitutional: Negative for appetite change and unexpected weight change.  HENT: Negative for congestion.   Respiratory: Negative for cough, chest tightness and shortness of breath.   Cardiovascular: Negative for chest pain, palpitations and leg swelling.  Gastrointestinal: Negative for abdominal pain, diarrhea, nausea and vomiting.  Genitourinary: Negative for difficulty urinating and dysuria.  Musculoskeletal: Negative for joint swelling and myalgias.       Leg pain as outlined.   Skin: Negative for color change and rash.  Neurological: Negative for dizziness, light-headedness and headaches.  Psychiatric/Behavioral: Negative for agitation and dysphoric mood.       Objective:    Physical Exam Vitals reviewed.  Constitutional:      General: He is not in acute distress.    Appearance: Normal appearance. He is well-developed and well-nourished.  HENT:     Head: Normocephalic and atraumatic.     Right Ear: External ear normal.     Left Ear: External ear normal.  Eyes:     General: No scleral icterus.       Right eye: No discharge.        Left eye: No discharge.     Conjunctiva/sclera: Conjunctivae normal.  Cardiovascular:     Rate and Rhythm: Normal rate and regular rhythm.  Pulmonary:     Effort: Pulmonary effort is normal. No respiratory distress.     Breath sounds: Normal breath sounds.  Abdominal:     General: Bowel sounds are normal.     Palpations: Abdomen is soft.     Tenderness: There is no abdominal tenderness.  Musculoskeletal:        General: No swelling, tenderness or edema.     Cervical back: Neck supple. No tenderness.  Lymphadenopathy:     Cervical: No cervical adenopathy.  Skin:    Findings: No erythema or rash.  Neurological:     Mental Status: He is alert.  Psychiatric:        Mood and Affect: Mood and affect and mood normal.        Behavior: Behavior normal.     BP 128/70   Pulse 72   Temp 98 F (36.7 C)  (Oral)   Resp 16   Ht 6' (1.829 m)   Wt 197 lb 6.4 oz (89.5 kg)   SpO2 98%   BMI 26.77 kg/m  Wt Readings from Last 3 Encounters:  04/14/20 197 lb 6.4 oz (89.5 kg)  01/10/20 197 lb 9.6 oz (89.6 kg)  10/07/19 195 lb (88.5 kg)     Lab Results  Component Value Date   WBC 5.1 04/12/2020   HGB 13.3 04/12/2020   HCT 38.9 (L) 04/12/2020   PLT 159.0  04/12/2020   GLUCOSE 96 04/12/2020   CHOL 104 04/12/2020   TRIG 85.0 04/12/2020   HDL 39.30 04/12/2020   LDLCALC 47 04/12/2020   ALT 19 04/12/2020   AST 23 04/12/2020   NA 140 04/12/2020   K 4.6 04/12/2020   CL 104 04/12/2020   CREATININE 1.09 04/12/2020   BUN 26 (H) 04/12/2020   CO2 29 04/12/2020   TSH 2.88 05/10/2019   PSA 0.80 12/08/2019   INR 1.08 02/13/2016    DG Chest 2 View  Result Date: 09/19/2019 CLINICAL DATA:  Intermittent fever and weakness over the past week. Diaphoresis last night. EXAM: CHEST - 2 VIEW COMPARISON:  None. FINDINGS: Lungs clear. Heart size normal. No pneumothorax or pleural effusion. Aortic atherosclerosis. No acute or focal bony abnormality. IMPRESSION: No acute disease. Aortic Atherosclerosis (ICD10-I70.0). Electronically Signed   By: Inge Rise M.D.   On: 09/19/2019 13:06       Assessment & Plan:   Problem List Items Addressed This Visit    Anemia    Follow cbc.       Aneurysm artery, iliac common ([place])    Evaluated by [facility] 06/01/19.  Recommended f/u in one year.        Carotid stenosis, asymptomatic, left    S/p left ICA CEA.  Followed by Dr Lucky Cowboy.  Continue risk factor modification.  Continue daily aspirin.       Essential hypertension, benign    Continue lisinopril and metoprolol.  Blood pressure as outlined.  Hold on making changes.  Follow pressures.  Follow metabolic panel.       Relevant Orders   TSH   Basic metabolic panel   Hypercholesterolemia    On crestor.  Low cholesterol diet and exercise.  Follow lipid panel and liver function tests.        Relevant Orders    Lipid panel   Hepatic function panel   Leg pain    Persistent thigh pain as outlined.  Has tried PT.  Discussed further w/up.  Discussed doing exercises at home.  He wants to monitor.        Thrombocytopenia ([place])    Saw hematology.  Recommended following q 4-6 months.        Relevant Orders   CBC with Differential/Platelet       Einar Pheasant, MD

## 2020-04-16 ENCOUNTER — Encounter: Payer: Self-pay | Admitting: Internal Medicine

## 2020-04-16 NOTE — Assessment & Plan Note (Signed)
Saw hematology.  Recommended following q 4-6 months.

## 2020-04-16 NOTE — Assessment & Plan Note (Signed)
Persistent thigh pain as outlined.  Has tried PT.  Discussed further w/up.  Discussed doing exercises at home.  He wants to monitor.

## 2020-04-16 NOTE — Assessment & Plan Note (Signed)
Evaluated by [facility] 06/01/19.  Recommended f/u in one year.

## 2020-04-16 NOTE — Assessment & Plan Note (Signed)
S/p left ICA CEA.  Followed by Dr Lucky Cowboy.  Continue risk factor modification.  Continue daily aspirin.

## 2020-04-16 NOTE — Assessment & Plan Note (Signed)
On crestor.  Low cholesterol diet and exercise.  Follow lipid panel and liver function tests.   

## 2020-04-16 NOTE — Assessment & Plan Note (Signed)
Follow cbc.  

## 2020-04-16 NOTE — Assessment & Plan Note (Signed)
Continue lisinopril and metoprolol.  Blood pressure as outlined.  Hold on making changes.  Follow pressures.  Follow metabolic panel.

## 2020-04-20 ENCOUNTER — Other Ambulatory Visit: Payer: Self-pay

## 2020-04-20 ENCOUNTER — Encounter: Payer: Self-pay | Admitting: Cardiovascular Disease

## 2020-04-20 ENCOUNTER — Ambulatory Visit (INDEPENDENT_AMBULATORY_CARE_PROVIDER_SITE_OTHER): Payer: Medicare HMO | Admitting: Cardiovascular Disease

## 2020-04-20 VITALS — BP 154/84 | HR 75 | Ht 72.0 in | Wt 198.5 lb

## 2020-04-20 DIAGNOSIS — I2510 Atherosclerotic heart disease of native coronary artery without angina pectoris: Secondary | ICD-10-CM

## 2020-04-20 DIAGNOSIS — I10 Essential (primary) hypertension: Secondary | ICD-10-CM

## 2020-04-20 DIAGNOSIS — I6522 Occlusion and stenosis of left carotid artery: Secondary | ICD-10-CM | POA: Diagnosis not present

## 2020-04-20 DIAGNOSIS — E785 Hyperlipidemia, unspecified: Secondary | ICD-10-CM

## 2020-04-20 NOTE — Progress Notes (Signed)
Cardiology Office Note   Date:  04/20/2020   ID:  Brett Riley, DOB 1935/12/06, MRN [id number]  PCP:  Brett Riley  Cardiologist:   Brett Sacramento, Riley   Chief Complaint  Patient presents with  . 12 month follow up    "doing well." Medications reviewed by the patient verbally.       History of Present Illness: Brett Riley is a 85 y.o. male who presents for a follow up visit regarding coronary artery disease.  He has known history of essential hypertension, hyperlipidemia and carotid artery disease status post left carotid endarterectomy. He was initially seen in 2019 in the setting of preop cardiovascular evaluation for left carotid endarterectomy.  His stress test was abnormal.  Cardiac catheterization was performed which showed moderate 40% ostial left main stenosis and 70% proximal and mid LAD disease.  He had significant diagonal right PDA disease.  However, both of these vessels were very small.  Medical therapy was recommended. He is followed by Brett Riley for carotid disease and common iliac artery aneurysm. 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 He was seen last year by Brett Riley and was noted to be hypertensive at that time.  Lisinopril was increased to 20 mg at that time.  He has been doing well and denies any chest pain, shortness of breath or palpitations.  He saw Brett Riley recently and his blood pressure was normal.  He takes his medications regularly  Past Medical History:  Diagnosis Date  . Carotid arterial disease ([place])    a. 02/2016 L CEA; b. 46/9629 U/S: patent LICA, 5-28% RICA.  Marland Kitchen Coronary artery disease    a. 01/2016 MV: mild apical/basal inferior, apical lateral, mid anterolateral, and mid inferolateral ischemia. EF 57%; b. 02/2016 Cath: LM 40ost, LAD 70p/m, 20d, D1 95 (small), LCX nl, RCA 59m, RPDA 90 (small), EF 55-65%-->med Rx. Rec CABG for recurrent symptoms.  . History of echocardiogram    a. 02/2016 Echo:  EF 50-55%, no rwma, mild MR.  Marland Kitchen Hypercholesterolemia   . Hypertension     Past Surgical History:  Procedure Laterality Date  . CARDIAC CATHETERIZATION N/A 01/19/2016   Procedure: Left Heart Cath and Coronary Angiography;  Surgeon: Brett Hampshire, Riley;  Location: [facility];  Service: Cardiovascular;  Laterality: N/A;  . ENDARTERECTOMY Left 02/15/2016   Procedure: ENDARTERECTOMY CAROTID;  Surgeon: Brett Huxley, Riley;  Location: [facility];  Service: Vascular;  Laterality: Left;     Current Outpatient Medications  Medication Sig Dispense Refill  . aspirin 81 MG tablet Take 81 mg by mouth at bedtime.     . clopidogrel (PLAVIX) 75 MG tablet TAKE 1 TABLET (75 MG TOTAL) BY MOUTH DAILY WITH BREAKFAST. 90 tablet 1  . lisinopril (ZESTRIL) 20 MG tablet TAKE 1 TABLET BY MOUTH EVERY DAY 90 tablet 0  . metoprolol succinate (TOPROL-XL) 100 MG 24 hr tablet TAKE 1 TABLET BY MOUTH EVERY DAY WITH A MEAL 90 tablet 2  . Multiple Vitamins-Iron (MULTIVITAMINS WITH IRON) TABS Take 1 tablet by mouth daily.    . nitroGLYCERIN (NITROSTAT) 0.4 MG SL tablet Place 1 tablet (0.4 mg total) under the tongue every 5 (five) minutes as needed. 25 tablet 6  . rosuvastatin (CRESTOR) 10 MG tablet TAKE 1 TABLET BY MOUTH EVERY DAY 90 tablet 0  . vitamin B-12 (CYANOCOBALAMIN) 1000 MCG tablet Take 1,000 mcg by mouth daily.     No current facility-administered  medications for this visit.    Allergies:   Patient has no known allergies.    Social History:  The patient  reports that he has never smoked. He has never used smokeless tobacco. He reports that he does not drink alcohol and does not use drugs.   Family History:  The patient's family history includes Breast cancer in his sister; Heart attack in his father; Heart disease in his father; Stroke in his mother.    ROS:  Please see the history of present illness.   Otherwise, review of systems are positive for none.   All other systems are reviewed and negative.     PHYSICAL EXAM: VS:  BP (!) 154/84 (BP Location: Left Arm, Patient Position: Sitting, Cuff Size: Normal)   Pulse 75   Ht 6' (1.829 m)   Wt 198 lb 8 oz (90 kg)   SpO2 98%   BMI 26.92 kg/m  , BMI Body mass index is 26.92 kg/m. GEN: Well nourished, well developed, in no acute distress  HEENT: normal  Neck: no JVD, carotid bruits, or masses Cardiac: RRR; no murmurs, rubs, or gallops,no edema  Respiratory:  clear to auscultation bilaterally, normal work of breathing GI: soft, nontender, nondistended, + BS MS: no deformity or atrophy  Skin: warm and dry, no rash Neuro:  Strength and sensation are intact Psych: euthymic mood, full affect   EKG:  EKG is ordered today. The ekg ordered today demonstrates normal sinus rhythm with no significant ST or T wave changes.  There is evidence of sinus arrhythmia.   Recent Labs: 05/10/2019: TSH 2.88 04/12/2020: ALT 19; BUN 26; Creatinine, Ser 1.09; Hemoglobin 13.3; Platelets 159.0; Potassium 4.6; Sodium 140    Lipid Panel    Component Value Date/Time   CHOL 104 04/12/2020 0844   TRIG 85.0 04/12/2020 0844   HDL 39.30 04/12/2020 0844   CHOLHDL 3 04/12/2020 0844   VLDL 17.0 04/12/2020 0844   LDLCALC 47 04/12/2020 0844      Wt Readings from Last 3 Encounters:  04/20/20 198 lb 8 oz (90 kg)  04/14/20 197 lb 6.4 oz (89.5 kg)  01/10/20 197 lb 9.6 oz (89.6 kg)        PAD Screen 01/11/2016 01/11/2016  Previous PAD dx? No No  Previous surgical procedure? No No  Pain with walking? No No  Feet/toe relief with dangling? No No  Painful, non-healing ulcers? No No  Extremities discolored? No No      ASSESSMENT AND PLAN:  1.  Coronary artery disease involving native coronary arteries without angina: He is doing well with no anginal symptoms.  Continue medical therapy.  2.  Essential hypertension: Blood pressure is mildly elevated today but his 2 office visits showed normal blood pressure.  I made no changes in his medications.  If blood  pressure is consistently elevated, recommend increasing lisinopril.  3.  Hyperlipidemia: Continue treatment with rosuvastatin.  I reviewed his recent lipid profile which was excellent with an LDL of 47.  4.  Carotid artery disease and common iliac artery aneurysm: Managed by vascular surgery   Disposition:   FU with me in 1 year  Signed,  Brett Sacramento, Riley  04/20/2020 8:22 AM    [place]

## 2020-04-20 NOTE — Patient Instructions (Signed)

## 2020-05-29 DIAGNOSIS — H6123 Impacted cerumen, bilateral: Secondary | ICD-10-CM | POA: Diagnosis not present

## 2020-05-29 DIAGNOSIS — H903 Sensorineural hearing loss, bilateral: Secondary | ICD-10-CM | POA: Diagnosis not present

## 2020-06-13 DIAGNOSIS — L02212 Cutaneous abscess of back [any part, except buttock]: Secondary | ICD-10-CM | POA: Diagnosis not present

## 2020-06-24 ENCOUNTER — Other Ambulatory Visit: Payer: Self-pay | Admitting: Family

## 2020-07-15 ENCOUNTER — Other Ambulatory Visit: Payer: Self-pay | Admitting: Family

## 2020-07-21 ENCOUNTER — Ambulatory Visit (INDEPENDENT_AMBULATORY_CARE_PROVIDER_SITE_OTHER): Payer: Medicare HMO | Admitting: Vascular Surgery

## 2020-07-21 ENCOUNTER — Ambulatory Visit (INDEPENDENT_AMBULATORY_CARE_PROVIDER_SITE_OTHER): Payer: Medicare HMO

## 2020-07-21 ENCOUNTER — Other Ambulatory Visit: Payer: Self-pay

## 2020-07-21 ENCOUNTER — Encounter (INDEPENDENT_AMBULATORY_CARE_PROVIDER_SITE_OTHER): Payer: Self-pay | Admitting: Vascular Surgery

## 2020-07-21 VITALS — BP 128/70 | HR 77 | Resp 16 | Wt 193.2 lb

## 2020-07-21 DIAGNOSIS — I6523 Occlusion and stenosis of bilateral carotid arteries: Secondary | ICD-10-CM | POA: Diagnosis not present

## 2020-07-21 DIAGNOSIS — I6522 Occlusion and stenosis of left carotid artery: Secondary | ICD-10-CM

## 2020-07-21 DIAGNOSIS — I10 Essential (primary) hypertension: Secondary | ICD-10-CM

## 2020-07-21 DIAGNOSIS — E785 Hyperlipidemia, unspecified: Secondary | ICD-10-CM

## 2020-07-21 DIAGNOSIS — I723 Aneurysm of iliac artery: Secondary | ICD-10-CM

## 2020-07-21 NOTE — Progress Notes (Signed)
MRN : [id number]  Brett Riley is a 85 y.o. (08-Feb-1936) male who presents with chief complaint of  Chief Complaint  Patient presents with  . Follow-up    Ultrasound follow up  .  History of Present Illness: Patient returns in follow-up of his carotid disease.  He has a past history of left carotid endarterectomy about 5 years ago.  He is doing well without any focal neurologic symptoms.  No complaints today.  His carotid duplex shows a patent left carotid endarterectomy without significant restenosis and 1 to 39% right ICA stenosis without progression from previous studies.  Current Outpatient Medications  Medication Sig Dispense Refill  . aspirin 81 MG tablet Take 81 mg by mouth at bedtime.     . clopidogrel (PLAVIX) 75 MG tablet TAKE 1 TABLET (75 MG TOTAL) BY MOUTH DAILY WITH BREAKFAST. 90 tablet 1  . lisinopril (ZESTRIL) 20 MG tablet TAKE 1 TABLET BY MOUTH EVERY DAY 90 tablet 2  . metoprolol succinate (TOPROL-XL) 100 MG 24 hr tablet TAKE 1 TABLET BY MOUTH EVERY DAY WITH A MEAL 90 tablet 2  . Multiple Vitamins-Iron (MULTIVITAMINS WITH IRON) TABS Take 1 tablet by mouth daily.    . nitroGLYCERIN (NITROSTAT) 0.4 MG SL tablet Place 1 tablet (0.4 mg total) under the tongue every 5 (five) minutes as needed. 25 tablet 6  . rosuvastatin (CRESTOR) 10 MG tablet TAKE 1 TABLET BY MOUTH EVERY DAY 90 tablet 3  . vitamin B-12 (CYANOCOBALAMIN) 1000 MCG tablet Take 1,000 mcg by mouth daily.     No current facility-administered medications for this visit.    Past Medical History:  Diagnosis Date  . Carotid arterial disease ([place])    a. 02/2016 L CEA; b. 54/2706 U/S: patent LICA, 2-37% RICA.  Marland Kitchen Coronary artery disease    a. 01/2016 MV: mild apical/basal inferior, apical lateral, mid anterolateral, and mid inferolateral ischemia. EF 57%; b. 02/2016 Cath: LM 40ost, LAD 70p/m, 20d, D1 95 (small), LCX nl, RCA 50m, RPDA 90 (small), EF 55-65%-->med Rx. Rec CABG for recurrent symptoms.  . History of  echocardiogram    a. 02/2016 Echo: EF 50-55%, no rwma, mild MR.  Marland Kitchen Hypercholesterolemia   . Hypertension     Past Surgical History:  Procedure Laterality Date  . CARDIAC CATHETERIZATION N/A 01/19/2016   Procedure: Left Heart Cath and Coronary Angiography;  Surgeon: Wellington Hampshire, MD;  Location: [facility];  Service: Cardiovascular;  Laterality: N/A;  . ENDARTERECTOMY Left 02/15/2016   Procedure: ENDARTERECTOMY CAROTID;  Surgeon: Algernon Huxley, MD;  Location: [facility];  Service: Vascular;  Laterality: Left;     Social History   Tobacco Use  . Smoking status: Never Smoker  . Smokeless tobacco: Never Used  Vaping Use  . Vaping Use: Never used  Substance Use Topics  . Alcohol use: No    Alcohol/week: 0.0 standard drinks  . Drug use: No      Family History  Problem Relation Age of Onset  . Heart disease Father        MI  . Heart attack Father   . Stroke Mother   . Breast cancer Sister   . Colon cancer Neg Hx   . Prostate cancer Neg Hx      No Known Allergies  REVIEW OF SYSTEMS (Negative unless checked)  Constitutional: [] ?Weight loss  [] ?Fever  [] ?Chills Cardiac: [] ?Chest pain   [] ?Chest pressure   [] ?Palpitations   [] ?Shortness of breath when laying flat   [] ?  Shortness of breath at rest   [] ?Shortness of breath with exertion. Vascular:  [] ?Pain in legs with walking   [] ?Pain in legs at rest   [] ?Pain in legs when laying flat   [] ?Claudication   [] ?Pain in feet when walking  [] ?Pain in feet at rest  [] ?Pain in feet when laying flat   [] ?History of DVT   [] ?Phlebitis   [] ?Swelling in legs   [] ?Varicose veins   [] ?Non-healing ulcers Pulmonary:   [] ?Uses home oxygen   [] ?Productive cough   [] ?Hemoptysis   [] ?Wheeze  [] ?COPD   [] ?Asthma Neurologic:  [] ?Dizziness  [] ?Blackouts   [] ?Seizures   [] ?History of stroke   [] ?History of TIA  [] ?Aphasia   [] ?Temporary blindness   [] ?Dysphagia   [] ?Weakness or numbness in arms   [] ?Weakness or numbness in  legs Musculoskeletal:  [x] ?Arthritis   [] ?Joint swelling   [] ?Joint pain   [] ?Low back pain Hematologic:  [] ?Easy bruising  [] ?Easy bleeding   [] ?Hypercoagulable state   [] ?Anemic   Gastrointestinal:  [] ?Blood in stool   [] ?Vomiting blood  [] ?Gastroesophageal reflux/heartburn   [] ?Abdominal pain Genitourinary:  [] ?Chronic kidney disease   [] ?Difficult urination  [] ?Frequent urination  [] ?Burning with urination   [] ?Hematuria Skin:  [] ?Rashes   [] ?Ulcers   [] ?Wounds Psychological:  [] ?History of anxiety   [] ? History of major depression.  Physical Examination  Vitals:   07/21/20 1011  BP: 128/70  Pulse: 77  Resp: 16  Weight: 193 lb 3.2 oz (87.6 kg)   Body mass index is 26.2 kg/m. Gen:  WD/WN, NAD. Appears younger than stated age. Head: [place]/AT, No temporalis wasting. Ear/Nose/Throat: Hearing grossly intact, nares w/o erythema or drainage, trachea midline Eyes: Conjunctiva clear. Sclera non-icteric Neck: Supple.  No bruit  Pulmonary:  Good air movement, equal and clear to auscultation bilaterally.  Cardiac: RRR, No JVD Vascular:  Vessel Right Left  Radial Palpable Palpable           Musculoskeletal: M/S 5/5 throughout.  No deformity or atrophy. No edema. Neurologic: CN 2-12 intact. Sensation grossly intact in extremities.  Symmetrical.  Speech is fluent. Motor exam as listed above. Psychiatric: Judgment intact, Mood & affect appropriate for pt's clinical situation. Dermatologic: No rashes or ulcers noted.  No cellulitis or open wounds.      CBC Lab Results  Component Value Date   WBC 5.1 04/12/2020   HGB 13.3 04/12/2020   HCT 38.9 (L) 04/12/2020   MCV 95.9 04/12/2020   PLT 159.0 04/12/2020    BMET    Component Value Date/Time   NA 140 04/12/2020 0844   NA 141 03/24/2019 1046   K 4.6 04/12/2020 0844   CL 104 04/12/2020 0844   CO2 29 04/12/2020 0844   GLUCOSE 96 04/12/2020 0844   BUN 26 (H) 04/12/2020 0844   BUN 18 03/24/2019 1046   CREATININE 1.09 04/12/2020  0844   CALCIUM 9.9 04/12/2020 0844   GFRNONAA 59 (L) 09/19/2019 0935   GFRAA >60 09/19/2019 0935   CrCl cannot be calculated (Patient's most recent lab result is older than the maximum 21 days allowed.).  COAG Lab Results  Component Value Date   INR 1.08 02/13/2016   INR 0.97 01/18/2016    Radiology No results found.   Assessment/Plan Essential hypertension, benign blood pressure control important in reducing the progression of atherosclerotic disease. On appropriate oral medications.   Hypercholesterolemia lipid control important in reducing the progression of atherosclerotic disease. Continue statin therapy  Carotid stenosis His carotid duplex shows a  patent left carotid endarterectomy without significant restenosis and 1 to 39% right ICA stenosis without progression from previous studies.  Doing well.  No change in medical regimen.  Recheck in 18 months.    Leotis Pain, MD  07/21/2020 11:15 AM    This note was created with Dragon medical transcription system.  Any errors from dictation are purely unintentional

## 2020-07-21 NOTE — Assessment & Plan Note (Signed)
His carotid duplex shows a patent left carotid endarterectomy without significant restenosis and 1 to 39% right ICA stenosis without progression from previous studies.  Doing well.  No change in medical regimen.  Recheck in 18 months.

## 2020-08-04 ENCOUNTER — Encounter (INDEPENDENT_AMBULATORY_CARE_PROVIDER_SITE_OTHER): Payer: Medicare HMO

## 2020-08-11 ENCOUNTER — Other Ambulatory Visit (INDEPENDENT_AMBULATORY_CARE_PROVIDER_SITE_OTHER): Payer: Medicare HMO

## 2020-08-11 ENCOUNTER — Other Ambulatory Visit: Payer: Medicare HMO

## 2020-08-11 ENCOUNTER — Other Ambulatory Visit: Payer: Self-pay

## 2020-08-11 DIAGNOSIS — D696 Thrombocytopenia, unspecified: Secondary | ICD-10-CM | POA: Diagnosis not present

## 2020-08-11 DIAGNOSIS — I10 Essential (primary) hypertension: Secondary | ICD-10-CM

## 2020-08-11 DIAGNOSIS — E7800 Pure hypercholesterolemia, unspecified: Secondary | ICD-10-CM | POA: Diagnosis not present

## 2020-08-11 LAB — BASIC METABOLIC PANEL WITH GFR
BUN: 29 mg/dL — ABNORMAL HIGH (ref 6–23)
CO2: 27 meq/L (ref 19–32)
Calcium: 9.4 mg/dL (ref 8.4–10.5)
Chloride: 107 meq/L (ref 96–112)
Creatinine, Ser: 1.12 mg/dL (ref 0.40–1.50)
GFR: 61.13 mL/min
Glucose, Bld: 94 mg/dL (ref 70–99)
Potassium: 4.8 meq/L (ref 3.5–5.1)
Sodium: 141 meq/L (ref 135–145)

## 2020-08-11 LAB — LIPID PANEL
Cholesterol: 100 mg/dL (ref 0–200)
HDL: 33.9 mg/dL — ABNORMAL LOW
LDL Cholesterol: 53 mg/dL (ref 0–99)
NonHDL: 66.39
Total CHOL/HDL Ratio: 3
Triglycerides: 68 mg/dL (ref 0.0–149.0)
VLDL: 13.6 mg/dL (ref 0.0–40.0)

## 2020-08-11 LAB — CBC WITH DIFFERENTIAL/PLATELET
Basophils Absolute: 0 10*3/uL (ref 0.0–0.1)
Basophils Relative: 0.8 % (ref 0.0–3.0)
Eosinophils Absolute: 0.3 10*3/uL (ref 0.0–0.7)
Eosinophils Relative: 5.6 % — ABNORMAL HIGH (ref 0.0–5.0)
HCT: 36.4 % — ABNORMAL LOW (ref 39.0–52.0)
Hemoglobin: 12.6 g/dL — ABNORMAL LOW (ref 13.0–17.0)
Lymphocytes Relative: 33.5 % (ref 12.0–46.0)
Lymphs Abs: 1.9 10*3/uL (ref 0.7–4.0)
MCHC: 34.7 g/dL (ref 30.0–36.0)
MCV: 95.3 fl (ref 78.0–100.0)
Monocytes Absolute: 0.5 10*3/uL (ref 0.1–1.0)
Monocytes Relative: 9.4 % (ref 3.0–12.0)
Neutro Abs: 2.9 10*3/uL (ref 1.4–7.7)
Neutrophils Relative %: 50.7 % (ref 43.0–77.0)
Platelets: 145 10*3/uL — ABNORMAL LOW (ref 150.0–400.0)
RBC: 3.82 Mil/uL — ABNORMAL LOW (ref 4.22–5.81)
RDW: 13.5 % (ref 11.5–15.5)
WBC: 5.8 10*3/uL (ref 4.0–10.5)

## 2020-08-11 LAB — HEPATIC FUNCTION PANEL
ALT: 14 U/L (ref 0–53)
AST: 19 U/L (ref 0–37)
Albumin: 4.3 g/dL (ref 3.5–5.2)
Alkaline Phosphatase: 64 U/L (ref 39–117)
Bilirubin, Direct: 0.1 mg/dL (ref 0.0–0.3)
Total Bilirubin: 0.7 mg/dL (ref 0.2–1.2)
Total Protein: 7.1 g/dL (ref 6.0–8.3)

## 2020-08-11 LAB — TSH: TSH: 3.9 u[IU]/mL (ref 0.35–4.50)

## 2020-08-15 ENCOUNTER — Ambulatory Visit (INDEPENDENT_AMBULATORY_CARE_PROVIDER_SITE_OTHER): Payer: Medicare HMO | Admitting: Internal Medicine

## 2020-08-15 ENCOUNTER — Other Ambulatory Visit: Payer: Self-pay

## 2020-08-15 ENCOUNTER — Encounter: Payer: Self-pay | Admitting: Internal Medicine

## 2020-08-15 DIAGNOSIS — I2510 Atherosclerotic heart disease of native coronary artery without angina pectoris: Secondary | ICD-10-CM

## 2020-08-15 DIAGNOSIS — D696 Thrombocytopenia, unspecified: Secondary | ICD-10-CM | POA: Diagnosis not present

## 2020-08-15 DIAGNOSIS — E785 Hyperlipidemia, unspecified: Secondary | ICD-10-CM

## 2020-08-15 DIAGNOSIS — I700 Atherosclerosis of aorta: Secondary | ICD-10-CM | POA: Diagnosis not present

## 2020-08-15 DIAGNOSIS — I6522 Occlusion and stenosis of left carotid artery: Secondary | ICD-10-CM | POA: Diagnosis not present

## 2020-08-15 DIAGNOSIS — I723 Aneurysm of iliac artery: Secondary | ICD-10-CM | POA: Diagnosis not present

## 2020-08-15 DIAGNOSIS — D649 Anemia, unspecified: Secondary | ICD-10-CM | POA: Diagnosis not present

## 2020-08-15 DIAGNOSIS — I10 Essential (primary) hypertension: Secondary | ICD-10-CM | POA: Diagnosis not present

## 2020-08-15 MED ORDER — CLOPIDOGREL BISULFATE 75 MG PO TABS: 75.0000 mg | ORAL_TABLET | Freq: Every day | ORAL | 1 refills | Status: DC

## 2020-08-15 NOTE — Progress Notes (Signed)
Patient ID: ARUSH GATLIFF, male   DOB: [birth year], 85 y.o.   MRN: [id number]   Subjective:    Patient ID: KARTHIKEYA FUNKE, male    DOB: 05-28-1935, 85 y.o.   MRN: [id number]  HPI This visit occurred during the SARS-CoV-2 public health emergency.  Safety protocols were in place, including screening questions prior to the visit, additional usage of staff PPE, and extensive cleaning of exam room while observing appropriate contact time as indicated for disinfecting solutions.   Patient here for a scheduled follow up.  Here to follow up regarding his blood pressure and cholesterol.  Saw Dr Lucky Cowboy 07/21/20 - 1-39% right ICA without progression.  Recommended f/u in 18 months.  Saw Dr Fletcher Anon 04/2020.  Stable.  No changes made.  Blood pressure a little elevated at that visit.  He stays active.  No chest pain or sob reported.  No abdominal pain or bowel change reported. Does report some fatigue.  Is working his regular job and doing a lot of yard work.  Handling stress.  States his blood pressure has been averaging 130-140/70.  Just recently started taking 20mg  lisinopril.     Past Medical History:  Diagnosis Date   Carotid arterial disease ([place])    a. 02/2016 L CEA; b. 34/7425 U/S: patent LICA, 9-56% RICA.   Coronary artery disease    a. 01/2016 MV: mild apical/basal inferior, apical lateral, mid anterolateral, and mid inferolateral ischemia. EF 57%; b. 02/2016 Cath: LM 40ost, LAD 70p/m, 20d, D1 95 (small), LCX nl, RCA 84m, RPDA 90 (small), EF 55-65%-->med Rx. Rec CABG for recurrent symptoms.   History of echocardiogram    a. 02/2016 Echo: EF 50-55%, no rwma, mild MR.   Hypercholesterolemia    Hypertension    Past Surgical History:  Procedure Laterality Date   CARDIAC CATHETERIZATION N/A 01/19/2016   Procedure: Left Heart Cath and Coronary Angiography;  Surgeon: Wellington Hampshire, MD;  Location: [facility];  Service: Cardiovascular;  Laterality: N/A;   ENDARTERECTOMY Left 02/15/2016   Procedure:  ENDARTERECTOMY CAROTID;  Surgeon: Algernon Huxley, MD;  Location: [facility];  Service: Vascular;  Laterality: Left;   Family History  Problem Relation Age of Onset   Heart disease Father        MI   Heart attack Father    Stroke Mother    Breast cancer Sister    Colon cancer Neg Hx    Prostate cancer Neg Hx    Social History   Socioeconomic History   Marital status: Married    Spouse name: Not on file   Number of children: 1   Years of education: Not on file   Highest education level: Not on file  Occupational History    Employer: [employer]  Tobacco Use   Smoking status: Never   Smokeless tobacco: Never  Vaping Use   Vaping Use: Never used  Substance and Sexual Activity   Alcohol use: No    Alcohol/week: 0.0 standard drinks   Drug use: No   Sexual activity: Not Currently  Other Topics Concern   Not on file  Social History Narrative   Not on file   Social Determinants of Health   Financial Resource Strain: Not on file  Food Insecurity: Not on file  Transportation Needs: Not on file  Physical Activity: Not on file  Stress: Not on file  Social Connections: Not on file    Outpatient Encounter Medications as of 08/15/2020  Medication Sig  aspirin 81 MG tablet Take 81 mg by mouth at bedtime.    clopidogrel (PLAVIX) 75 MG tablet Take 1 tablet (75 mg total) by mouth daily with breakfast.   lisinopril (ZESTRIL) 20 MG tablet TAKE 1 TABLET BY MOUTH EVERY DAY   metoprolol succinate (TOPROL-XL) 100 MG 24 hr tablet TAKE 1 TABLET BY MOUTH EVERY DAY WITH A MEAL   Multiple Vitamins-Iron (MULTIVITAMINS WITH IRON) TABS Take 1 tablet by mouth daily.   nitroGLYCERIN (NITROSTAT) 0.4 MG SL tablet Place 1 tablet (0.4 mg total) under the tongue every 5 (five) minutes as needed.   rosuvastatin (CRESTOR) 10 MG tablet TAKE 1 TABLET BY MOUTH EVERY DAY   vitamin B-12 (CYANOCOBALAMIN) 1000 MCG tablet Take 1,000 mcg by mouth daily.   [DISCONTINUED] clopidogrel (PLAVIX) 75 MG tablet TAKE 1  TABLET (75 MG TOTAL) BY MOUTH DAILY WITH BREAKFAST.   No facility-administered encounter medications on file as of 08/15/2020.     Review of Systems  Constitutional:  Positive for fatigue. Negative for appetite change and unexpected weight change.  HENT:  Negative for congestion and sinus pressure.   Respiratory:  Negative for cough, chest tightness and shortness of breath.   Cardiovascular:  Negative for chest pain, palpitations and leg swelling.  Gastrointestinal:  Negative for abdominal pain, diarrhea, nausea and vomiting.  Genitourinary:  Negative for difficulty urinating and dysuria.  Musculoskeletal:  Negative for joint swelling and myalgias.  Skin:  Negative for color change and rash.  Neurological:  Negative for dizziness, light-headedness and headaches.  Psychiatric/Behavioral:  Negative for agitation and dysphoric mood.       Objective:    Physical Exam Vitals reviewed.  Constitutional:      General: He is not in acute distress.    Appearance: Normal appearance. He is well-developed.  HENT:     Head: Normocephalic and atraumatic.     Right Ear: External ear normal.     Left Ear: External ear normal.  Eyes:     General: No scleral icterus.       Right eye: No discharge.        Left eye: No discharge.     Conjunctiva/sclera: Conjunctivae normal.  Cardiovascular:     Rate and Rhythm: Normal rate and regular rhythm.  Pulmonary:     Effort: Pulmonary effort is normal. No respiratory distress.     Breath sounds: Normal breath sounds.  Abdominal:     General: Bowel sounds are normal.     Palpations: Abdomen is soft.     Tenderness: There is no abdominal tenderness.  Musculoskeletal:        General: No swelling or tenderness.     Cervical back: Neck supple. No tenderness.  Lymphadenopathy:     Cervical: No cervical adenopathy.  Skin:    Findings: No erythema or rash.  Neurological:     Mental Status: He is alert.  Psychiatric:        Mood and Affect: Mood normal.         Behavior: Behavior normal.    BP 138/80   Pulse 65   Temp 97.8 F (36.6 C)   Resp 16   Ht 6' (1.829 m)   Wt 194 lb (88 kg)   SpO2 98%   BMI 26.31 kg/m  Wt Readings from Last 3 Encounters:  08/15/20 194 lb (88 kg)  07/21/20 193 lb 3.2 oz (87.6 kg)  04/20/20 198 lb 8 oz (90 kg)     Lab Results  Component Value  Date   WBC 5.8 08/11/2020   HGB 12.6 (L) 08/11/2020   HCT 36.4 (L) 08/11/2020   PLT 145.0 (L) 08/11/2020   GLUCOSE 94 08/11/2020   CHOL 100 08/11/2020   TRIG 68.0 08/11/2020   HDL 33.90 (L) 08/11/2020   LDLCALC 53 08/11/2020   ALT 14 08/11/2020   AST 19 08/11/2020   NA 141 08/11/2020   K 4.8 08/11/2020   CL 107 08/11/2020   CREATININE 1.12 08/11/2020   BUN 29 (H) 08/11/2020   CO2 27 08/11/2020   TSH 3.90 08/11/2020   PSA 0.80 12/08/2019   INR 1.08 02/13/2016    DG Chest 2 View  Result Date: 09/19/2019 CLINICAL DATA:  Intermittent fever and weakness over the past week. Diaphoresis last night. EXAM: CHEST - 2 VIEW COMPARISON:  None. FINDINGS: Lungs clear. Heart size normal. No pneumothorax or pleural effusion. Aortic atherosclerosis. No acute or focal bony abnormality. IMPRESSION: No acute disease. Aortic Atherosclerosis (ICD10-I70.0). Electronically Signed   By: Inge Rise M.D.   On: 09/19/2019 13:06       Assessment & Plan:   Problem List Items Addressed This Visit     Anemia    Follow cbc.  Slightly decreased on recent check.  Schedule f/u cbc and iron studies.         Relevant Orders   IBC + Ferritin   CBC with Differential/Platelet   Aneurysm artery, iliac common ([place])    Has been evaluated by [facility].  Last 06/01/2019.  Recommended follow-up in 1 year.  Had appointment May 2022.  Continue risk factor modification.       Aortic atherosclerosis ([facility])    Continue crestor.         CAD (coronary artery disease)    Previous cath - 2 vessel disease.  Risk factor modification.  Continue crestor.        Carotid stenosis,  asymptomatic, left    S/p left ICA CEA.  Followed by Dr Lucky Cowboy.  Continue risk factor modification.  Continue daily aspirin.        Hyperlipidemia    On crestor.  Low cholesterol diet and exercise.  Follow lipid panel and liver function tests.         Hypertension    Just started lisinopril 20 mg daily recently.  Blood pressure as outlined.  No changes to medication today.  Follow pressures.  Follow metabolic panel.  Continue metoprolol.       Thrombocytopenia ([place])    Continues follow-up with hematology.         Einar Pheasant, MD

## 2020-08-27 ENCOUNTER — Encounter: Payer: Self-pay | Admitting: Internal Medicine

## 2020-08-27 DIAGNOSIS — I700 Atherosclerosis of aorta: Secondary | ICD-10-CM | POA: Insufficient documentation

## 2020-08-27 DIAGNOSIS — I719 Aortic aneurysm of unspecified site, without rupture: Secondary | ICD-10-CM | POA: Insufficient documentation

## 2020-08-27 NOTE — Assessment & Plan Note (Signed)
Continue crestor 

## 2020-08-27 NOTE — Assessment & Plan Note (Signed)
On crestor.  Low cholesterol diet and exercise.  Follow lipid panel and liver function tests.   

## 2020-08-27 NOTE — Assessment & Plan Note (Addendum)
Just started lisinopril 20 mg daily recently.  Blood pressure as outlined.  No changes to medication today.  Follow pressures.  Follow metabolic panel.  Continue metoprolol.

## 2020-08-27 NOTE — Assessment & Plan Note (Signed)
Has been evaluated by [facility].  Last 06/01/2019.  Recommended follow-up in 1 year.  Had appointment May 2022.  Continue risk factor modification.

## 2020-08-27 NOTE — Assessment & Plan Note (Signed)
Previous cath - 2 vessel disease.  Risk factor modification.  Continue crestor.  

## 2020-08-27 NOTE — Assessment & Plan Note (Addendum)
Follow cbc.  Slightly decreased on recent check.  Schedule f/u cbc and iron studies.

## 2020-08-27 NOTE — Assessment & Plan Note (Signed)
Continues follow-up with hematology.

## 2020-08-27 NOTE — Assessment & Plan Note (Signed)
S/p left ICA CEA.  Followed by Dr Lucky Cowboy.  Continue risk factor modification.  Continue daily aspirin.

## 2020-08-29 ENCOUNTER — Ambulatory Visit (INDEPENDENT_AMBULATORY_CARE_PROVIDER_SITE_OTHER): Payer: Medicare HMO

## 2020-08-29 VITALS — Ht 72.0 in | Wt 194.0 lb

## 2020-08-29 DIAGNOSIS — Z0000 Encounter for general adult medical examination without abnormal findings: Secondary | ICD-10-CM

## 2020-08-29 NOTE — Patient Instructions (Addendum)
Brett Riley , Thank you for taking time to come for your Medicare Wellness Visit. I appreciate your ongoing commitment to your health goals. Please review the following plan we discussed and let me know if I can assist you in the future.   These are the goals we discussed:  Goals       Patient Stated     Weight (lb) < 190 lb (86.2 kg) (pt-stated)      Portion control Low carb diet        Other     Maintain Healthy Lifestyle      Stay active          This is a list of the screening recommended for you and due dates:  Health Maintenance  Topic Date Due   COVID-19 Vaccine (4 - Booster for Pfizer series) 08/31/2020*   Zoster (Shingles) Vaccine (1 of 2) 11/15/2020*   Flu Shot  10/02/2020   Tetanus Vaccine  12/22/2022   Pneumonia vaccines  Completed   HPV Vaccine  Aged Out  *Topic was postponed. The date shown is not the original due date.    Advanced directives: End of life planning; Advance aging; Advanced directives discussed.  Copy of current HCPOA/Living Will requested.    Conditions/risks identified: none new  Follow up in one year for your annual wellness visit.   Preventive Care 63 Years and Older, Male Preventive care refers to lifestyle choices and visits with your health care provider that can promote health and wellness. What does preventive care include? A yearly physical exam. This is also called an annual well check. Dental exams once or twice a year. Routine eye exams. Ask your health care provider how often you should have your eyes checked. Personal lifestyle choices, including: Daily care of your teeth and gums. Regular physical activity. Eating a healthy diet. Avoiding tobacco and drug use. Limiting alcohol use. Practicing safe sex. Taking low doses of aspirin every day. Taking vitamin and mineral supplements as recommended by your health care provider. What happens during an annual well check? The services and screenings done by your health  care provider during your annual well check will depend on your age, overall health, lifestyle risk factors, and family history of disease. Counseling  Your health care provider may ask you questions about your: Alcohol use. Tobacco use. Drug use. Emotional well-being. Home and relationship well-being. Sexual activity. Eating habits. History of falls. Memory and ability to understand (cognition). Work and work Statistician. Screening  You may have the following tests or measurements: Height, weight, and BMI. Blood pressure. Lipid and cholesterol levels. These may be checked every 5 years, or more frequently if you are over 33 years old. Skin check. Lung cancer screening. You may have this screening every year starting at age 52 if you have a 30-pack-year history of smoking and currently smoke or have quit within the past 15 years. Fecal occult blood test (FOBT) of the stool. You may have this test every year starting at age 33. Flexible sigmoidoscopy or colonoscopy. You may have a sigmoidoscopy every 5 years or a colonoscopy every 10 years starting at age 30. Prostate cancer screening. Recommendations will vary depending on your family history and other risks. Hepatitis C blood test. Hepatitis B blood test. Sexually transmitted disease (STD) testing. Diabetes screening. This is done by checking your blood sugar (glucose) after you have not eaten for a while (fasting). You may have this done every 1-3 years. Abdominal aortic aneurysm (AAA) screening. You  may need this if you are a current or former smoker. Osteoporosis. You may be screened starting at age 22 if you are at high risk. Talk with your health care provider about your test results, treatment options, and if necessary, the need for more tests. Vaccines  Your health care provider may recommend certain vaccines, such as: Influenza vaccine. This is recommended every year. Tetanus, diphtheria, and acellular pertussis (Tdap, Td)  vaccine. You may need a Td booster every 10 years. Zoster vaccine. You may need this after age 14. Pneumococcal 13-valent conjugate (PCV13) vaccine. One dose is recommended after age 62. Pneumococcal polysaccharide (PPSV23) vaccine. One dose is recommended after age 11. Talk to your health care provider about which screenings and vaccines you need and how often you need them. This information is not intended to replace advice given to you by your health care provider. Make sure you discuss any questions you have with your health care provider. Document Released: 03/17/2015 Document Revised: 11/08/2015 Document Reviewed: 12/20/2014 Elsevier Interactive Patient Education  2017 [place] Prevention in the Home Falls can cause injuries. They can happen to people of all ages. There are many things you can do to make your home safe and to help prevent falls. What can I do on the outside of my home? Regularly fix the edges of walkways and driveways and fix any cracks. Remove anything that might make you trip as you walk through a door, such as a raised step or threshold. Trim any bushes or trees on the path to your home. Use bright outdoor lighting. Clear any walking paths of anything that might make someone trip, such as rocks or tools. Regularly check to see if handrails are loose or broken. Make sure that both sides of any steps have handrails. Any raised decks and porches should have guardrails on the edges. Have any leaves, snow, or ice cleared regularly. Use sand or salt on walking paths during winter. Clean up any spills in your garage right away. This includes oil or grease spills. What can I do in the bathroom? Use night lights. Install grab bars by the toilet and in the tub and shower. Do not use towel bars as grab bars. Use non-skid mats or decals in the tub or shower. If you need to sit down in the shower, use a plastic, non-slip stool. Keep the floor dry. Clean up any  water that spills on the floor as soon as it happens. Remove soap buildup in the tub or shower regularly. Attach bath mats securely with double-sided non-slip rug tape. Do not have throw rugs and other things on the floor that can make you trip. What can I do in the bedroom? Use night lights. Make sure that you have a light by your bed that is easy to reach. Do not use any sheets or blankets that are too big for your bed. They should not hang down onto the floor. Have a firm chair that has side arms. You can use this for support while you get dressed. Do not have throw rugs and other things on the floor that can make you trip. What can I do in the kitchen? Clean up any spills right away. Avoid walking on wet floors. Keep items that you use a lot in easy-to-reach places. If you need to reach something above you, use a strong step stool that has a grab bar. Keep electrical cords out of the way. Do not use floor polish or wax that  makes floors slippery. If you must use wax, use non-skid floor wax. Do not have throw rugs and other things on the floor that can make you trip. What can I do with my stairs? Do not leave any items on the stairs. Make sure that there are handrails on both sides of the stairs and use them. Fix handrails that are broken or loose. Make sure that handrails are as long as the stairways. Check any carpeting to make sure that it is firmly attached to the stairs. Fix any carpet that is loose or worn. Avoid having throw rugs at the top or bottom of the stairs. If you do have throw rugs, attach them to the floor with carpet tape. Make sure that you have a light switch at the top of the stairs and the bottom of the stairs. If you do not have them, ask someone to add them for you. What else can I do to help prevent falls? Wear shoes that: Do not have high heels. Have rubber bottoms. Are comfortable and fit you well. Are closed at the toe. Do not wear sandals. If you use a  stepladder: Make sure that it is fully opened. Do not climb a closed stepladder. Make sure that both sides of the stepladder are locked into place. Ask someone to hold it for you, if possible. Clearly mark and make sure that you can see: Any grab bars or handrails. First and last steps. Where the edge of each step is. Use tools that help you move around (mobility aids) if they are needed. These include: Canes. Walkers. Scooters. Crutches. Turn on the lights when you go into a dark area. Replace any light bulbs as soon as they burn out. Set up your furniture so you have a clear path. Avoid moving your furniture around. If any of your floors are uneven, fix them. If there are any pets around you, be aware of where they are. Review your medicines with your doctor. Some medicines can make you feel dizzy. This can increase your chance of falling. Ask your doctor what other things that you can do to help prevent falls. This information is not intended to replace advice given to you by your health care provider. Make sure you discuss any questions you have with your health care provider. Document Released: 12/15/2008 Document Revised: 07/27/2015 Document Reviewed: 03/25/2014 Elsevier Interactive Patient Education  2017 [employer].

## 2020-08-29 NOTE — Progress Notes (Signed)
Subjective:   Brett Riley is a 85 y.o. male who presents for Medicare Annual/Subsequent preventive examination.  Review of Systems    No ROS.  Medicare Wellness Virtual Visit.  Visual/audio telehealth visit, UTA vital signs.   See social history for additional risk factors.   Cardiac Risk Factors include: advanced age (>87men, >32 women);male gender;hypertension     Objective:    Today's Vitals   08/29/20 0827  Weight: 194 lb (88 kg)   Body mass index is 26.31 kg/m.  Advanced Directives 08/29/2020 09/19/2019 08/27/2019 05/08/2018 06/26/2017 05/06/2017 03/24/2017  Does Patient Have a Medical Advance Directive? Yes No;Yes Yes Yes Yes Yes Yes  Type of Academic librarian Living will [place];Living will [place];Living will Living will;Healthcare Power of [place];Living will [place];Living will  Does patient want to make changes to medical advance directive? No - Patient declined - No - Patient declined No - Patient declined - No - Patient declined No - Patient declined  Copy of [place] in Chart? No - copy requested - No - copy requested No - copy requested - No - copy requested No - copy requested    Current Medications (verified) Outpatient Encounter Medications as of 08/29/2020  Medication Sig   aspirin 81 MG tablet Take 81 mg by mouth at bedtime.    clopidogrel (PLAVIX) 75 MG tablet Take 1 tablet (75 mg total) by mouth daily with breakfast.   lisinopril (ZESTRIL) 20 MG tablet TAKE 1 TABLET BY MOUTH EVERY DAY   metoprolol succinate (TOPROL-XL) 100 MG 24 hr tablet TAKE 1 TABLET BY MOUTH EVERY DAY WITH A MEAL   Multiple Vitamins-Iron (MULTIVITAMINS WITH IRON) TABS Take 1 tablet by mouth daily.   nitroGLYCERIN (NITROSTAT) 0.4 MG SL tablet Place 1 tablet (0.4 mg total) under the tongue every 5 (five) minutes as needed.   rosuvastatin (CRESTOR) 10 MG  tablet TAKE 1 TABLET BY MOUTH EVERY DAY   vitamin B-12 (CYANOCOBALAMIN) 1000 MCG tablet Take 1,000 mcg by mouth daily.   No facility-administered encounter medications on file as of 08/29/2020.    Allergies (verified) Patient has no known allergies.   History: Past Medical History:  Diagnosis Date   Carotid arterial disease ([place])    a. 02/2016 L CEA; b. 48/5462 U/S: patent LICA, 7-03% RICA.   Coronary artery disease    a. 01/2016 MV: mild apical/basal inferior, apical lateral, mid anterolateral, and mid inferolateral ischemia. EF 57%; b. 02/2016 Cath: LM 40ost, LAD 70p/m, 20d, D1 95 (small), LCX nl, RCA 58m, RPDA 90 (small), EF 55-65%-->med Rx. Rec CABG for recurrent symptoms.   History of echocardiogram    a. 02/2016 Echo: EF 50-55%, no rwma, mild MR.   Hypercholesterolemia    Hypertension    Past Surgical History:  Procedure Laterality Date   CARDIAC CATHETERIZATION N/A 01/19/2016   Procedure: Left Heart Cath and Coronary Angiography;  Surgeon: Wellington Hampshire, MD;  Location: [facility];  Service: Cardiovascular;  Laterality: N/A;   ENDARTERECTOMY Left 02/15/2016   Procedure: ENDARTERECTOMY CAROTID;  Surgeon: Algernon Huxley, MD;  Location: [facility];  Service: Vascular;  Laterality: Left;   Family History  Problem Relation Age of Onset   Heart disease Father        MI   Heart attack Father    Stroke Mother    Breast cancer Sister    Colon cancer Neg Hx  Prostate cancer Neg Hx    Social History   Socioeconomic History   Marital status: Married    Spouse name: Not on file   Number of children: 1   Years of education: Not on file   Highest education level: Not on file  Occupational History    Employer: [employer]  Tobacco Use   Smoking status: Never   Smokeless tobacco: Never  Vaping Use   Vaping Use: Never used  Substance and Sexual Activity   Alcohol use: No    Alcohol/week: 0.0 standard drinks   Drug use: No   Sexual activity: Not Currently  Other  Topics Concern   Not on file  Social History Narrative   Not on file   Social Determinants of Health   Financial Resource Strain: Low Risk    Difficulty of Paying Living Expenses: Not hard at all  Food Insecurity: No Food Insecurity   Worried About Charity fundraiser in the Last Year: Never true   [place] in the Last Year: Never true  Transportation Needs: No Transportation Needs   Lack of Transportation (Medical): No   Lack of Transportation (Non-Medical): No  Physical Activity: Insufficiently Active   Days of Exercise per Week: 3 days   Minutes of Exercise per Session: 20 min  Stress: No Stress Concern Present   Feeling of Stress : Not at all  Social Connections: Unknown   Frequency of Communication with Friends and Family: Not on file   Frequency of Social Gatherings with Friends and Family: Not on file   Attends Religious Services: Not on Electrical engineer or Organizations: Not on file   Attends Archivist Meetings: Not on file   Marital Status: Married    Tobacco Counseling Counseling given: Not Answered   Clinical Intake:  Pre-visit preparation completed: Yes        Diabetes: No  How often do you need to have someone help you when you read instructions, pamphlets, or other written materials from your doctor or pharmacy?: 1 - Never  Interpreter Needed?: No      Activities of Daily Living In your present state of health, do you have any difficulty performing the following activities: 08/29/2020  Hearing? Y  Comment Hearing aid  Vision? N  Difficulty concentrating or making decisions? N  Walking or climbing stairs? N  Dressing or bathing? N  Doing errands, shopping? N  Preparing Food and eating ? N  Using the Toilet? N  In the past six months, have you accidently leaked urine? N  Do you have problems with loss of bowel control? N  Managing your Medications? N  Managing your Finances? N  Housekeeping or managing your  Housekeeping? N  Some recent data might be hidden    Patient Care Team: Einar Pheasant, MD as PCP - General (Internal Medicine) Wellington Hampshire, MD as PCP - Cardiology (Cardiology) Einar Pheasant, MD (Internal Medicine) Bary Castilla Forest Gleason, MD (General Surgery)  Indicate any recent Medical Services you may have received from other than [facility] providers in the past year (date may be approximate).     Assessment:   This is a routine wellness examination for Brett Riley.  I connected with Brett Riley today by telephone and verified that I am speaking with the correct person using two identifiers. Location patient: home Location provider: work Persons participating in the virtual visit: patient, Marine scientist.    I discussed the limitations, risks, security and privacy  concerns of performing an evaluation and management service by telephone and the availability of in person appointments. The patient expressed understanding and verbally consented to this telephonic visit.    Interactive audio and video telecommunications were attempted between this provider and patient, however failed, due to patient having technical difficulties OR patient did not have access to video capability.  We continued and completed visit with audio only.  Some vital signs may be absent or patient reported.   Hearing/Vision screen Hearing Screening - Comments:: Hearing aids Vision Screening - Comments:: Followed by Dr. Atilano Median Wears corrective lenses Visual acuity not assessed, virtual visit. They have seen their ophthalmologist in the last 12 months.   Dietary issues and exercise activities discussed: Current Exercise Habits: Home exercise routine, Type of exercise: walking, Time (Minutes): 20, Frequency (Times/Week): 3, Weekly Exercise (Minutes/Week): 60, Intensity: Mild   Goals Addressed             This Visit's Progress    Maintain Healthy Lifestyle       Stay active         Depression Screen PHQ 2/9 Scores  08/29/2020 08/27/2019 05/08/2018 05/06/2017 09/24/2016 04/30/2016 09/07/2015  PHQ - 2 Score 0 0 0 0 0 0 0  PHQ- 9 Score - - - - 4 - -    Fall Risk Fall Risk  08/29/2020 08/27/2019 05/10/2019 05/08/2018 05/06/2017  Falls in the past year? 0 0 0 0 No  Number falls in past yr: 0 - - - -  Injury with Fall? 0 - - - -  Follow up Falls evaluation completed Falls evaluation completed - - -    FALL RISK PREVENTION PERTAINING TO THE HOME: Handrails in use when climbing stairs? Yes Home free of loose throw rugs in walkways, pet beds, electrical cords, etc? Yes  Adequate lighting in your home to reduce risk of falls? Yes   ASSISTIVE DEVICES UTILIZED TO PREVENT FALLS: Life alert? No  Use of a cane, walker or w/c? No   TIMED UP AND GO: Was the test performed? No .   Cognitive Function: MMSE - Mini Mental State Exam 05/06/2017 04/30/2016  Orientation to time 5 5  Orientation to Place 5 5  Registration 3 3  Attention/ Calculation 5 5  Recall 2 3  Language- name 2 objects 2 2  Language- repeat 1 1  Language- follow 3 step command 3 3  Language- read & follow direction 1 1  Write a sentence 1 1  Copy design 1 1  Total score 29 30     6CIT Screen 08/29/2020 08/27/2019 05/08/2018  What Year? 0 points 0 points 0 points  What month? 0 points 0 points 0 points  What time? 0 points - 0 points  Count back from 20 0 points - 0 points  Months in reverse 0 points 0 points 0 points  Repeat phrase 0 points - 0 points  Total Score 0 - 0    Immunizations Immunization History  Administered Date(s) Administered   Fluad Quad(high Dose 65+) 01/10/2020   Influenza, High Dose Seasonal PF 12/27/2016, 05/08/2018   Influenza,inj,Quad PF,6+ Mos 01/21/2013, 11/22/2013, 03/09/2015, 02/16/2016   PFIZER(Purple Top)SARS-COV-2 Vaccination 03/22/2019, 04/12/2019, 12/14/2019   Pneumococcal Conjugate-13 02/01/2014   Pneumococcal Polysaccharide-23 04/30/2016   Td 12/21/2012     Health Maintenance  There are no preventive care  reminders to display for this patient. Health Maintenance  Topic Date Due   COVID-19 Vaccine (4 - Booster for [place] series) 08/31/2020 (Originally 04/15/2020)  Zoster Vaccines- Shingrix (1 of 2) 11/15/2020 (Originally 12/25/1987)   INFLUENZA VACCINE  10/02/2020   TETANUS/TDAP  12/22/2022   PNA vac Low Risk Adult  Completed   HPV VACCINES  Aged Out   Hepatitis C Screening: does not qualify  Vision Screening: Recommended annual ophthalmology exams for early detection of glaucoma and other disorders of the eye. Is the patient up to date with their annual eye exam?  Yes   Dental Screening: Recommended annual dental exams for proper oral hygiene  Community Resource Referral / Chronic Care Management: CRR required this visit?  No   CCM required this visit?  No      Plan:   Keep all routine maintenance appointments.   I have personally reviewed and noted the following in the patient's chart:   Medical and social history Use of alcohol, tobacco or illicit drugs  Current medications and supplements including opioid prescriptions. Patient is not currently taking opioid prescriptions. Functional ability and status Nutritional status Physical activity Advanced directives List of other physicians Hospitalizations, surgeries, and ER visits in previous 12 months Vitals Screenings to include cognitive, depression, and falls Referrals and appointments  In addition, I have reviewed and discussed with patient certain preventive protocols, quality metrics, and best practice recommendations. A written personalized care plan for preventive services as well as general preventive health recommendations were provided to patient via mail.     Varney Biles, LPN   9/38/1829

## 2020-09-12 ENCOUNTER — Other Ambulatory Visit: Payer: Medicare HMO

## 2020-09-18 ENCOUNTER — Other Ambulatory Visit: Payer: Medicare HMO

## 2020-09-19 ENCOUNTER — Ambulatory Visit (INDEPENDENT_AMBULATORY_CARE_PROVIDER_SITE_OTHER): Payer: Medicare HMO | Admitting: Vascular Surgery

## 2020-09-19 ENCOUNTER — Encounter (INDEPENDENT_AMBULATORY_CARE_PROVIDER_SITE_OTHER): Payer: Medicare HMO

## 2020-09-20 ENCOUNTER — Other Ambulatory Visit: Payer: Self-pay

## 2020-09-20 ENCOUNTER — Other Ambulatory Visit (INDEPENDENT_AMBULATORY_CARE_PROVIDER_SITE_OTHER): Payer: Medicare HMO

## 2020-09-20 DIAGNOSIS — D649 Anemia, unspecified: Secondary | ICD-10-CM

## 2020-09-20 LAB — CBC WITH DIFFERENTIAL/PLATELET
Basophils Absolute: 0 10*3/uL (ref 0.0–0.1)
Basophils Relative: 0.6 % (ref 0.0–3.0)
Eosinophils Absolute: 0.2 10*3/uL (ref 0.0–0.7)
Eosinophils Relative: 4.2 % (ref 0.0–5.0)
HCT: 36.8 % — ABNORMAL LOW (ref 39.0–52.0)
Hemoglobin: 12.5 g/dL — ABNORMAL LOW (ref 13.0–17.0)
Lymphocytes Relative: 41.1 % (ref 12.0–46.0)
Lymphs Abs: 2 10*3/uL (ref 0.7–4.0)
MCHC: 33.9 g/dL (ref 30.0–36.0)
MCV: 97.5 fl (ref 78.0–100.0)
Monocytes Absolute: 0.5 10*3/uL (ref 0.1–1.0)
Monocytes Relative: 9.3 % (ref 3.0–12.0)
Neutro Abs: 2.2 10*3/uL (ref 1.4–7.7)
Neutrophils Relative %: 44.8 % (ref 43.0–77.0)
Platelets: 155 10*3/uL (ref 150.0–400.0)
RBC: 3.77 Mil/uL — ABNORMAL LOW (ref 4.22–5.81)
RDW: 13.8 % (ref 11.5–15.5)
WBC: 5 10*3/uL (ref 4.0–10.5)

## 2020-09-20 LAB — IBC + FERRITIN
Ferritin: 181.7 ng/mL (ref 22.0–322.0)
Iron: 77 ug/dL (ref 42–165)
Saturation Ratios: 24 % (ref 20.0–50.0)
Transferrin: 229 mg/dL (ref 212.0–360.0)

## 2020-09-28 DIAGNOSIS — D485 Neoplasm of uncertain behavior of skin: Secondary | ICD-10-CM | POA: Diagnosis not present

## 2020-09-28 DIAGNOSIS — Z872 Personal history of diseases of the skin and subcutaneous tissue: Secondary | ICD-10-CM | POA: Diagnosis not present

## 2020-09-28 DIAGNOSIS — Z85828 Personal history of other malignant neoplasm of skin: Secondary | ICD-10-CM | POA: Diagnosis not present

## 2020-09-28 DIAGNOSIS — C44319 Basal cell carcinoma of skin of other parts of face: Secondary | ICD-10-CM | POA: Diagnosis not present

## 2020-09-28 DIAGNOSIS — L578 Other skin changes due to chronic exposure to nonionizing radiation: Secondary | ICD-10-CM | POA: Diagnosis not present

## 2020-09-28 DIAGNOSIS — L570 Actinic keratosis: Secondary | ICD-10-CM | POA: Diagnosis not present

## 2020-10-08 ENCOUNTER — Emergency Department
Admission: EM | Admit: 2020-10-08 | Discharge: 2020-10-08 | Disposition: A | Discharge status: Home / Self Care | Payer: Medicare HMO | Attending: Emergency Medicine | Admitting: Emergency Medicine

## 2020-10-08 ENCOUNTER — Encounter: Payer: Self-pay | Admitting: Emergency Medicine

## 2020-10-08 ENCOUNTER — Other Ambulatory Visit: Payer: Self-pay

## 2020-10-08 DIAGNOSIS — Z79899 Other long term (current) drug therapy: Secondary | ICD-10-CM | POA: Insufficient documentation

## 2020-10-08 DIAGNOSIS — L7622 Postprocedural hemorrhage and hematoma of skin and subcutaneous tissue following other procedure: Secondary | ICD-10-CM | POA: Insufficient documentation

## 2020-10-08 DIAGNOSIS — Z7982 Long term (current) use of aspirin: Secondary | ICD-10-CM | POA: Insufficient documentation

## 2020-10-08 DIAGNOSIS — K068 Other specified disorders of gingiva and edentulous alveolar ridge: Secondary | ICD-10-CM | POA: Diagnosis not present

## 2020-10-08 DIAGNOSIS — Z7902 Long term (current) use of antithrombotics/antiplatelets: Secondary | ICD-10-CM | POA: Insufficient documentation

## 2020-10-08 DIAGNOSIS — R58 Hemorrhage, not elsewhere classified: Secondary | ICD-10-CM

## 2020-10-08 DIAGNOSIS — I10 Essential (primary) hypertension: Secondary | ICD-10-CM | POA: Diagnosis not present

## 2020-10-08 DIAGNOSIS — I2510 Atherosclerotic heart disease of native coronary artery without angina pectoris: Secondary | ICD-10-CM | POA: Insufficient documentation

## 2020-10-08 NOTE — ED Provider Notes (Signed)
[facility] Emergency Department Provider Note  ____________________________________________   Event Date/Time   First MD Initiated Contact with Patient 10/08/20 [phone]     (approximate)  I have reviewed the triage vital signs and the nursing notes.   HISTORY  Chief Complaint Post-op Problem    HPI Brett Riley is a 85 y.o. male who presents for evaluation of bleeding from the site of a basal cell carcinoma that was removed recently from his left forehead.  He reports that the lesion was removed about 10 days ago by his dermatologist.  The plan is for the dermatologist to go back and take some more tissue.  However he and his wife were awake and during the night tonight because he had been lying on the spot on his forehead on his pillow and then they discovered that there was "blood everywhere".  The entire part of the wound seem to be bleeding heavily rather than just coming for 1 small area.  Reportedly EMS was not able to get the bleeding under control and recommended that he come to the emergency department "to burn it."  Since coming to the emergency department the bleeding has stopped.  He has a bandage over it with no active bleeding.  He denies any other symptoms including fever, chest pain, shortness of breath, lightheadedness.  It was acute in onset and severe nothing in particular made it better or worse.     Past Medical History:  Diagnosis Date   Carotid arterial disease ([place])    a. 02/2016 L CEA; b. [id number] U/S: patent LICA, 123456 RICA.   Coronary artery disease    a. 01/2016 MV: mild apical/basal inferior, apical lateral, mid anterolateral, and mid inferolateral ischemia. EF 57%; b. 02/2016 Cath: LM 40ost, LAD 70p/m, 20d, D1 95 (small), LCX nl, RCA 80m RPDA 90 (small), EF 55-65%-->med Rx. Rec CABG for recurrent symptoms.   History of echocardiogram    a. 02/2016 Echo: EF 50-55%, no rwma, mild MR.   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   Dizziness 08/04/2016   Abdominal bruit 08/04/2016   Carotid stenosis, asymptomatic, left 02/15/2016   CAD (coronary artery disease)    Health care maintenance 09/11/2014   Hip region mass 12/12/2013   Soft tissue mass 11/28/2013   Fatigue 07/28/2013   Environmental allergies 07/28/2013   Carotid stenosis 01/21/2013   Thrombocytopenia ([place] 07/26/2012   Anemia 07/26/2012   Essential hypertension, benign 05/24/2012   Hyperlipidemia 05/24/2012    Past Surgical History:  Procedure Laterality Date   CARDIAC CATHETERIZATION N/A 01/19/2016   Procedure: Left Heart Cath and Coronary Angiography;  Surgeon: MWellington Hampshire MD;  [facility];  Service: Cardiovascular;  Laterality: N/A;   ENDARTERECTOMY Left 02/15/2016   Procedure: ENDARTERECTOMY CAROTID;  Surgeon: JAlgernon Huxley MD;  Location: [facility] ORS;  Service: Vascular;  Laterality: Left;    Prior to Admission medications   Medication Sig Start Date End Date Taking? Authorizing Provider  aspirin 81 MG tablet Take 81 mg by mouth at bedtime.     [provider]  clopidogrel (PLAVIX) 75 MG tablet Take 1 tablet (75 mg total) by mouth daily with breakfast. 08/15/20   SEinar Pheasant MD  lisinopril (ZESTRIL) 20 MG tablet  TAKE 1 TABLET BY MOUTH EVERY DAY 07/17/20   Wellington Hampshire, MD  metoprolol succinate (TOPROL-XL) 100 MG 24 hr tablet TAKE 1 TABLET BY MOUTH EVERY DAY WITH A MEAL 02/23/20   Einar Pheasant, MD  Multiple Vitamins-Iron (MULTIVITAMINS WITH IRON) TABS Take 1 tablet by mouth daily.    [provider]  nitroGLYCERIN (NITROSTAT) 0.4 MG SL tablet Place 1 tablet (0.4 mg total) under the tongue every 5 (five) minutes as needed. 01/23/16   Wende Bushy, MD  rosuvastatin (CRESTOR) 10 MG  tablet TAKE 1 TABLET BY MOUTH EVERY DAY 06/26/20   Loel Dubonnet, NP  vitamin B-12 (CYANOCOBALAMIN) 1000 MCG tablet Take 1,000 mcg by mouth daily.    [provider]    Allergies Patient has no known allergies.  Family History  Problem Relation Age of Onset   Heart disease Father        MI   Heart attack Father    Stroke Mother    Breast cancer Sister    Colon cancer Neg Hx    Prostate cancer Neg Hx     Social History Social History   Tobacco Use   Smoking status: Never   Smokeless tobacco: Never  Vaping Use   Vaping Use: Never used  Substance Use Topics   Alcohol use: No    Alcohol/week: 0.0 standard drinks   Drug use: No    Review of Systems Constitutional: No fever/chills Cardiovascular: Denies chest pain. Respiratory: Denies shortness of breath. Gastrointestinal: No abdominal pain.  No nausea, no vomiting.   Integumentary: Bleeding from wound on left forehead as described above. Neurological: Negative for headaches, focal weakness or numbness.   ____________________________________________   PHYSICAL EXAM:  VITAL SIGNS: ED Triage Vitals  Enc Vitals Group     BP 10/08/20 0516 (!) 164/92     Pulse Rate 10/08/20 0516 67     Resp 10/08/20 0516 20     Temp 10/08/20 0516 98.4 F (36.9 C)     Temp Source 10/08/20 0516 Oral     SpO2 10/08/20 0516 99 %     Weight 10/08/20 0515 88 kg (194 lb)     Height 10/08/20 0515 1.829 m (6')     Head Circumference --      Peak Flow --      Pain Score 10/08/20 0515 0     Pain Loc --      Pain Edu? --      Excl. in [place]? --     Constitutional: Alert and oriented.  Eyes: Conjunctivae are normal.  Head: Atraumatic.  See skin exam below for additional details. Nose: No congestion/rhinnorhea. Mouth/Throat: Patient is wearing a mask. Neck: No stridor.  No meningeal signs.   Cardiovascular: Normal rate, regular rhythm. Good peripheral circulation. Respiratory: Normal respiratory effort.  No  retractions. Neurologic:  Normal speech and language. No gross focal neurologic deficits are appreciated.  Skin: Patient has a lesion on his left forehead that is approximately 6 mm in diameter and roughly circular.  There is no active bleeding even though the area looks raw.  No sign of surrounding infection. Psychiatric: Mood and affect are normal. Speech and behavior are normal.  ____________________________________________     INITIAL IMPRESSION / MDM / [place] / ED COURSE  As part of my medical decision making, I reviewed the following data within the electronic MEDICAL RECORD NUMBER History obtained from family, Nursing notes reviewed and incorporated, and Notes from prior ED visits  Bleeding has already stopped.  There is no obvious small area to use silver nitrate for cautery, and I am reluctant to cauterize the entire wound for fear that it will affect the dermatologist follow-up appointment.  I used Surgicel and placed it directly over the wound and then covered that with gauze and attached firmly to his forehead with tape.  This way if it starts to bleed again the Surgicel should help the clot quickly.  I encouraged him to follow-up with his dermatologist.  He and his wife understand and agree with the plan.  He is otherwise asymptomatic and does not need any additional work-up.      ____________________________________________  FINAL CLINICAL IMPRESSION(S) / ED DIAGNOSES  Final diagnoses:  Bleeding     MEDICATIONS GIVEN DURING THIS VISIT:  Medications - No data to display   ED Discharge Orders     None        Note:  This document was prepared using Dragon voice recognition software and may include unintentional dictation errors.   Hinda Kehr, MD 10/08/20 [phone]

## 2020-10-08 NOTE — ED Triage Notes (Signed)
Pt to ED via POV, states had basal cell carcinoma removed approx 1 week ago, states awoke this morning and noted bleeding from the site, no bleeding noted at this time.

## 2020-10-08 NOTE — Discharge Instructions (Signed)
Please keep the bandage in place on your wound.  Change it if it gets wet or dirty, but hopefully you can keep it in place until you follow-up with your dermatologist.  If it starts to bleed again, try to apply firm direct pressure and see if this helps it to stop.

## 2020-10-08 NOTE — ED Notes (Signed)
NAD noted at time of D/C. Pt denies questions or concerns. Pt ambulatory to the lobby at this time. Verbal consent for D/C obtained.

## 2020-10-09 ENCOUNTER — Other Ambulatory Visit: Payer: Self-pay

## 2020-10-09 ENCOUNTER — Ambulatory Visit (INDEPENDENT_AMBULATORY_CARE_PROVIDER_SITE_OTHER): Payer: Medicare HMO | Admitting: Internal Medicine

## 2020-10-09 DIAGNOSIS — I723 Aneurysm of iliac artery: Secondary | ICD-10-CM | POA: Diagnosis not present

## 2020-10-09 DIAGNOSIS — T148XXA Other injury of unspecified body region, initial encounter: Secondary | ICD-10-CM

## 2020-10-09 DIAGNOSIS — D696 Thrombocytopenia, unspecified: Secondary | ICD-10-CM | POA: Diagnosis not present

## 2020-10-09 DIAGNOSIS — I10 Essential (primary) hypertension: Secondary | ICD-10-CM

## 2020-10-09 DIAGNOSIS — I700 Atherosclerosis of aorta: Secondary | ICD-10-CM

## 2020-10-09 DIAGNOSIS — E785 Hyperlipidemia, unspecified: Secondary | ICD-10-CM

## 2020-10-09 DIAGNOSIS — I2510 Atherosclerotic heart disease of native coronary artery without angina pectoris: Secondary | ICD-10-CM

## 2020-10-09 DIAGNOSIS — I6522 Occlusion and stenosis of left carotid artery: Secondary | ICD-10-CM

## 2020-10-09 MED ORDER — LISINOPRIL 40 MG PO TABS: 40.0000 mg | ORAL_TABLET | Freq: Every day | ORAL | 1 refills | Status: DC

## 2020-10-09 NOTE — Progress Notes (Signed)
Patient ID: PIUS BULSON, male   DOB: [birth year], 85 y.o.   MRN: [id number]   Subjective:    Patient ID: Brett Riley, male    DOB: 20-Jan-1936, 85 y.o.   MRN: [id number]  HPI This visit occurred during the SARS-CoV-2 public health emergency.  Safety protocols were in place, including screening questions prior to the visit, additional usage of staff PPE, and extensive cleaning of exam room while observing appropriate contact time as indicated for disinfecting solutions.   Patient here for a scheduled follow up. Here to follow up regarding his blood pressure.  Recently had basal cell carcinoma removed from left forehead (10 days ago).  Was seen in ER 10/08/20 - bleeding from incision site.  EMS - not able to get the bleeding to stop.  Bleeding stopped in ER.  Surgicel used and area covered.  Was told not to remove until saw dermatology.  Has f/u this week with dermatology.  No further bleeding.  No headache.  No chest pain or sob reported.  Eating.  No abdominal pain.  Bowels moving.   Past Medical History:  Diagnosis Date   Carotid arterial disease ([place])    a. 02/2016 L CEA; b. [id number] U/S: patent LICA, 123456 RICA.   Coronary artery disease    a. 01/2016 MV: mild apical/basal inferior, apical lateral, mid anterolateral, and mid inferolateral ischemia. EF 57%; b. 02/2016 Cath: LM 40ost, LAD 70p/m, 20d, D1 95 (small), LCX nl, RCA 75m RPDA 90 (small), EF 55-65%-->med Rx. Rec CABG for recurrent symptoms.   History of echocardiogram    a. 02/2016 Echo: EF 50-55%, no rwma, mild MR.   Hypercholesterolemia    Hypertension    Past Surgical History:  Procedure Laterality Date   CARDIAC CATHETERIZATION N/A 01/19/2016   Procedure: Left Heart Cath and Coronary Angiography;  Surgeon: MWellington Hampshire MD;  [facility];  Service: Cardiovascular;  Laterality: N/A;   ENDARTERECTOMY Left 02/15/2016   Procedure: ENDARTERECTOMY CAROTID;  Surgeon: JAlgernon Huxley MD;  Location: [facility] ORS;   Service: Vascular;  Laterality: Left;   Family History  Problem Relation Age of Onset   Heart disease Father        MI   Heart attack Father    Stroke Mother    Breast cancer Sister    Colon cancer Neg Hx    Prostate cancer Neg Hx    Social History   Socioeconomic History   Marital status: Married    Spouse name: Not on file   Number of children: 1   Years of education: Not on file   Highest education level: Not on file  Occupational History    Employer: [employer]  Tobacco Use   Smoking status: Never   Smokeless tobacco: Never  Vaping Use   Vaping Use: Never used  Substance and Sexual Activity   Alcohol use: No    Alcohol/week: 0.0 standard drinks   Drug use: No   Sexual activity: Not Currently  Other Topics Concern   Not on file  Social History Narrative   Not on file   Social Determinants of Health   Financial Resource Strain: Low Risk    Difficulty of Paying Living Expenses: Not hard at all  Food Insecurity: No Food Insecurity   Worried About RCharity fundraiserin the Last Year: Never true   RLeonardin the Last Year: Never true  Transportation Needs: No Transportation Needs   Lack of Transportation (  Medical): No   Lack of Transportation (Non-Medical): No  Physical Activity: Insufficiently Active   Days of Exercise per Week: 3 days   Minutes of Exercise per Session: 20 min  Stress: No Stress Concern Present   Feeling of Stress : Not at all  Social Connections: Unknown   Frequency of Communication with Friends and Family: Not on file   Frequency of Social Gatherings with Friends and Family: Not on file   Attends Religious Services: Not on file   Active Member of Clubs or Organizations: Not on file   Attends Archivist Meetings: Not on file   Marital Status: Married    Review of Systems  Constitutional:  Negative for appetite change and unexpected weight change.  HENT:  Negative for congestion and sinus pressure.   Respiratory:   Negative for cough, chest tightness and shortness of breath.   Cardiovascular:  Negative for chest pain, palpitations and leg swelling.  Gastrointestinal:  Negative for abdominal pain, diarrhea, nausea and vomiting.  Genitourinary:  Negative for difficulty urinating and dysuria.  Musculoskeletal:  Negative for joint swelling and myalgias.  Skin:  Negative for color change and rash.  Neurological:  Negative for dizziness, light-headedness and headaches.  Psychiatric/Behavioral:  Negative for agitation and dysphoric mood.       Objective:    Physical Exam Vitals reviewed.  Constitutional:      General: He is not in acute distress.    Appearance: Normal appearance. He is well-developed.  HENT:     Head: Normocephalic and atraumatic.     Right Ear: External ear normal.     Left Ear: External ear normal.  Eyes:     General: No scleral icterus.       Right eye: No discharge.        Left eye: No discharge.     Conjunctiva/sclera: Conjunctivae normal.  Cardiovascular:     Rate and Rhythm: Normal rate and regular rhythm.  Pulmonary:     Effort: Pulmonary effort is normal. No respiratory distress.     Breath sounds: Normal breath sounds.  Abdominal:     General: Bowel sounds are normal.     Palpations: Abdomen is soft.     Tenderness: There is no abdominal tenderness.  Musculoskeletal:        General: No swelling or tenderness.     Cervical back: Neck supple. No tenderness.  Lymphadenopathy:     Cervical: No cervical adenopathy.  Skin:    Findings: No erythema or rash.     Comments: Bandage in place - forehead.  No oozing.   Neurological:     Mental Status: He is alert.  Psychiatric:        Mood and Affect: Mood normal.        Behavior: Behavior normal.    BP 140/70   Pulse 69   Temp 97.8 F (36.6 C)   Resp 16   Ht 6' (1.829 m)   Wt 194 lb (88 kg)   SpO2 99%   BMI 26.31 kg/m  Wt Readings from Last 3 Encounters:  10/09/20 194 lb (88 kg)  10/08/20 194 lb (88 kg)   08/29/20 194 lb (88 kg)    Outpatient Encounter Medications as of 10/09/2020  Medication Sig   lisinopril (ZESTRIL) 40 MG tablet Take 1 tablet (40 mg total) by mouth daily.   aspirin 81 MG tablet Take 81 mg by mouth at bedtime.    clopidogrel (PLAVIX) 75 MG tablet Take 1 tablet (  75 mg total) by mouth daily with breakfast.   metoprolol succinate (TOPROL-XL) 100 MG 24 hr tablet TAKE 1 TABLET BY MOUTH EVERY DAY WITH A MEAL   Multiple Vitamins-Iron (MULTIVITAMINS WITH IRON) TABS Take 1 tablet by mouth daily.   nitroGLYCERIN (NITROSTAT) 0.4 MG SL tablet Place 1 tablet (0.4 mg total) under the tongue every 5 (five) minutes as needed.   rosuvastatin (CRESTOR) 10 MG tablet TAKE 1 TABLET BY MOUTH EVERY DAY   vitamin B-12 (CYANOCOBALAMIN) 1000 MCG tablet Take 1,000 mcg by mouth daily.   [DISCONTINUED] lisinopril (ZESTRIL) 20 MG tablet TAKE 1 TABLET BY MOUTH EVERY DAY   No facility-administered encounter medications on file as of 10/09/2020.     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  They will arrange f/u.       Relevant Medications   lisinopril (ZESTRIL) 40 MG tablet   Aortic atherosclerosis ([place])    Continue crestor.        Relevant Medications   lisinopril (ZESTRIL) 40 MG tablet   CAD (coronary artery disease)    Previous cath - 2 vessel disease.  Risk factor modification.  Continue  crestor.       Relevant Medications   lisinopril (ZESTRIL) 40 MG tablet   Carotid stenosis, asymptomatic, left    S/p left ICA CEA.  Followed by Dr Lucky Cowboy.  Continue risk factor modification.  Continue daily aspirin.       Relevant Medications   lisinopril (ZESTRIL) 40 MG tablet   Hyperlipidemia    On crestor.  Low cholesterol diet and exercise.  Follow lipid panel and liver function tests.        Relevant Medications   lisinopril (ZESTRIL) 40 MG tablet   Hypertension    Blood pressure remains elevated.  Increase lisinopril to '40mg'$  q day.  Follow pressures.  Follow metabolic panel.  Continue current dose of metoprolol.       Relevant Medications   lisinopril (ZESTRIL) 40 MG tablet   Open wound of skin    S/p basal cell removal.  Problem with bleeding as outlined.  Evaluated in ER.  Surgicel placed.  Bandage in place.  No oozing.  Keep f/u with dermatology this week.        Thrombocytopenia ([place])    Saw hematology.  Recommended f/u 2-3x/year.  Last check wnl.  Follow.         Einar Pheasant, MD

## 2020-10-09 NOTE — Patient Instructions (Signed)
Increase lisinopril from '20mg'$  to '40mg'$  per day.  You have '20mg'$  tablets currently.  You may take two of these per day until they run out.  When you get your new prescription, it should be for lisinopril '40mg'$  and you will take one per day.

## 2020-10-12 DIAGNOSIS — C4491 Basal cell carcinoma of skin, unspecified: Secondary | ICD-10-CM | POA: Diagnosis not present

## 2020-10-12 DIAGNOSIS — C44319 Basal cell carcinoma of skin of other parts of face: Secondary | ICD-10-CM | POA: Diagnosis not present

## 2020-10-15 ENCOUNTER — Other Ambulatory Visit: Payer: Self-pay

## 2020-10-15 ENCOUNTER — Emergency Department: Payer: Medicare HMO

## 2020-10-15 ENCOUNTER — Emergency Department
Admission: EM | Admit: 2020-10-15 | Discharge: 2020-10-15 | Disposition: A | Discharge status: Home / Self Care | Payer: Medicare HMO | Attending: Emergency Medicine | Admitting: Emergency Medicine

## 2020-10-15 DIAGNOSIS — I2510 Atherosclerotic heart disease of native coronary artery without angina pectoris: Secondary | ICD-10-CM | POA: Insufficient documentation

## 2020-10-15 DIAGNOSIS — Z7982 Long term (current) use of aspirin: Secondary | ICD-10-CM | POA: Insufficient documentation

## 2020-10-15 DIAGNOSIS — Z79899 Other long term (current) drug therapy: Secondary | ICD-10-CM | POA: Diagnosis not present

## 2020-10-15 DIAGNOSIS — Z85828 Personal history of other malignant neoplasm of skin: Secondary | ICD-10-CM | POA: Insufficient documentation

## 2020-10-15 DIAGNOSIS — I10 Essential (primary) hypertension: Secondary | ICD-10-CM | POA: Diagnosis not present

## 2020-10-15 DIAGNOSIS — Z7902 Long term (current) use of antithrombotics/antiplatelets: Secondary | ICD-10-CM | POA: Insufficient documentation

## 2020-10-15 DIAGNOSIS — R569 Unspecified convulsions: Secondary | ICD-10-CM | POA: Diagnosis not present

## 2020-10-15 DIAGNOSIS — L03211 Cellulitis of face: Secondary | ICD-10-CM | POA: Diagnosis not present

## 2020-10-15 DIAGNOSIS — C4491 Basal cell carcinoma of skin, unspecified: Secondary | ICD-10-CM | POA: Diagnosis not present

## 2020-10-15 LAB — COMPREHENSIVE METABOLIC PANEL WITH GFR
ALT: 18 U/L (ref 0–44)
AST: 21 U/L (ref 15–41)
Albumin: 4.2 g/dL (ref 3.5–5.0)
Alkaline Phosphatase: 76 U/L (ref 38–126)
Anion gap: 6 (ref 5–15)
BUN: 19 mg/dL (ref 8–23)
CO2: 30 mmol/L (ref 22–32)
Calcium: 9.7 mg/dL (ref 8.9–10.3)
Chloride: 104 mmol/L (ref 98–111)
Creatinine, Ser: 0.87 mg/dL (ref 0.61–1.24)
GFR, Estimated: >60 mL/min
Glucose, Bld: 118 mg/dL — ABNORMAL HIGH (ref 70–99)
Potassium: 3.8 mmol/L (ref 3.5–5.1)
Sodium: 140 mmol/L (ref 135–145)
Total Bilirubin: 0.7 mg/dL (ref 0.3–1.2)
Total Protein: 8 g/dL (ref 6.5–8.1)

## 2020-10-15 LAB — CBC WITH DIFFERENTIAL/PLATELET
Abs Immature Granulocytes: 0.03 10*3/uL (ref 0.00–0.07)
Basophils Absolute: 0 10*3/uL (ref 0.0–0.1)
Basophils Relative: 0 %
Eosinophils Absolute: 0.2 10*3/uL (ref 0.0–0.5)
Eosinophils Relative: 3 %
HCT: 41.4 % (ref 39.0–52.0)
Hemoglobin: 14.1 g/dL (ref 13.0–17.0)
Immature Granulocytes: 0 %
Lymphocytes Relative: 27 %
Lymphs Abs: 2.4 10*3/uL (ref 0.7–4.0)
MCH: 33.4 pg (ref 26.0–34.0)
MCHC: 34.1 g/dL (ref 30.0–36.0)
MCV: 98.1 fL (ref 80.0–100.0)
Monocytes Absolute: 0.6 10*3/uL (ref 0.1–1.0)
Monocytes Relative: 6 %
Neutro Abs: 5.9 10*3/uL (ref 1.7–7.7)
Neutrophils Relative %: 64 %
Platelets: 184 10*3/uL (ref 150–400)
RBC: 4.22 MIL/uL (ref 4.22–5.81)
RDW: 13 % (ref 11.5–15.5)
WBC: 9.2 10*3/uL (ref 4.0–10.5)
nRBC: 0 % (ref 0.0–0.2)

## 2020-10-15 LAB — LACTIC ACID, PLASMA: Lactic Acid, Venous: 1.2 mmol/L (ref 0.5–1.9)

## 2020-10-15 MED ORDER — CEPHALEXIN 500 MG PO CAPS: 500.0000 mg | ORAL_CAPSULE | Freq: Three times a day (TID) | ORAL | 0 refills | Status: AC

## 2020-10-15 MED ORDER — SODIUM CHLORIDE 0.9 % IV SOLN
2.0000 g | Freq: Once | INTRAVENOUS | Status: AC
  Administered 2020-10-15: 2 g via INTRAVENOUS
  Filled 2020-10-15: qty 20

## 2020-10-15 MED ORDER — IOHEXOL 350 MG/ML SOLN
50.0000 mL | Freq: Once | INTRAVENOUS | Status: AC | PRN
  Administered 2020-10-15: 50 mL via INTRAVENOUS
  Filled 2020-10-15: qty 50

## 2020-10-15 MED ORDER — DOXYCYCLINE MONOHYDRATE 100 MG PO TABS: 100.0000 mg | ORAL_TABLET | Freq: Two times a day (BID) | ORAL | 0 refills | Status: AC

## 2020-10-15 NOTE — Discharge Instructions (Addendum)
Take Keflex 3 times daily for the next 7 days. Take doxycycline twice daily for the next 7 days. Return to the emergency department in 2 days if wound appears worse.

## 2020-10-15 NOTE — ED Triage Notes (Signed)
Pt comes with c/o bleeding to top of forehead on left. Pt states procedure for basal cell carcinoma week ago. Pt had bleeding that occurred following procedure. Pt states they went back to doctor and they applied pressure bandage.  Friday pt noticed swelling to face and eye. Pt states more bleeding. Bandage in place at this time. Bleeding controlled.

## 2020-10-15 NOTE — ED Provider Notes (Signed)
[facility]EMERGENCY DEPARTMENT  ____________________________________________  Time seen: Approximately 9:11 PM  I have reviewed the triage vital signs and the nursing notes.   HISTORY  Chief Complaint Post-op Problem   Historian Patient     HPI Brett Riley is a 85 y.o. male presents to the emergency department with purulent cellulitis of the left forehead.  Patient had a basal cell carcinoma removed a week ago by his dermatologist and [place].  Patient return to his dermatologist on Friday with concern for infection and was started on Keflex.  Patient reports that he has had a low-grade fever at home and has been sleeping more than usual.  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  He denies chest pain, chest tightness or abdominal pain.  No nausea, vomiting or diarrhea.  No prior history of skin infections in the past.  No history of diabetes.   Past Medical History:  Diagnosis Date   Carotid arterial disease ([place])    a. 02/2016 L CEA; b. [id number] U/S: patent LICA, [id number] RICA.   Coronary artery disease    a. 01/2016 MV: mild apical/basal inferior, apical lateral, mid anterolateral, and mid inferolateral ischemia. EF 57%; b. 02/2016 Cath: LM 40ost, LAD 70p/m, 20d, D1 95 (small), LCX nl, RCA 29m RPDA 90 (small), EF 55-65%-->med Rx. Rec CABG for recurrent symptoms.   History of echocardiogram    a. 02/2016 Echo: EF 50-55%, no rwma, mild MR.   Hypercholesterolemia    Hypertension      Immunizations up to date:  Yes.     Past Medical History:  Diagnosis Date   Carotid arterial disease ([place]    a. 02/2016 L CEA; b. [id number]: patent LICA, [id number].   Coronary artery disease    a. 01/2016 MV: mild apical/basal inferior, apical lateral, mid anterolateral, and mid inferolateral ischemia. EF 57%; b. 02/2016 Cath: LM 40ost, LAD 70p/m, 20d, D1 95 (small), LCX nl, RCA 281mRPDA 90 (small), EF 55-65%-->med Rx. Rec CABG for recurrent symptoms.    History of echocardiogram    a. 02/2016 Echo: EF 50-55%, no rwma, mild MR.   Hypercholesterolemia    Hypertension     Patient Active Problem List   Diagnosis Date Noted   Aortic atherosclerosis ([place]06/26/2022   Leg pain 01/10/2020   Fever 10/03/2019   Hypertension 06/01/2019   Aneurysm artery, iliac common ([place]CL[place]03/30/2020   Dizziness 08/04/2016   Abdominal bruit 08/04/2016   Carotid stenosis, asymptomatic, left 02/15/2016   CAD (coronary artery disease)    Health care maintenance 09/11/2014   Hip region mass 12/12/2013   Soft tissue mass 11/28/2013   Fatigue 07/28/2013   Environmental allergies 07/28/2013   Carotid stenosis 01/21/2013   Thrombocytopenia ([place]05/25/2014   Anemia 07/26/2012   Essential hypertension, benign 05/24/2012   Hyperlipidemia 05/24/2012    Past Surgical History:  Procedure Laterality Date   CARDIAC CATHETERIZATION N/A 01/19/2016   Procedure: Left Heart Cath and Coronary Angiography;  Surgeon: MuWellington HampshireMD;  [facility];  Service: Cardiovascular;  Laterality: N/A;   ENDARTERECTOMY Left 02/15/2016   Procedure: ENDARTERECTOMY CAROTID;  Surgeon: JaAlgernon HuxleyMD;  Location: [facility];  Service: Vascular;  Laterality: Left;    Prior to Admission medications   Medication Sig Start Date End Date Taking? Authorizing Provider  cephALEXin (KEFLEX) 500 MG capsule Take 1 capsule (500 mg total) by mouth 3 (three) times daily for 7 days. 10/15/20 10/22/20 Yes  Vallarie Mare M, PA-C  doxycycline (ADOXA) 100 MG tablet Take 1 tablet (100 mg total) by mouth 2 (two) times daily for 7 days. 10/15/20 10/22/20 Yes Lannie Fields, PA-C  aspirin 81 MG tablet Take 81 mg by mouth at bedtime.     [provider]  clopidogrel (PLAVIX) 75 MG tablet Take 1 tablet (75 mg total) by mouth daily with breakfast. 08/15/20   Einar Pheasant, MD  lisinopril (ZESTRIL) 40 MG tablet Take 1 tablet (40 mg total) by mouth daily. 10/09/20   Einar Pheasant, MD   metoprolol succinate (TOPROL-XL) 100 MG 24 hr tablet TAKE 1 TABLET BY MOUTH EVERY DAY WITH A MEAL 02/23/20   Einar Pheasant, MD  Multiple Vitamins-Iron (MULTIVITAMINS WITH IRON) TABS Take 1 tablet by mouth daily.    [provider]  nitroGLYCERIN (NITROSTAT) 0.4 MG SL tablet Place 1 tablet (0.4 mg total) under the tongue every 5 (five) minutes as needed. 01/23/16   Wende Bushy, MD  rosuvastatin (CRESTOR) 10 MG tablet TAKE 1 TABLET BY MOUTH EVERY DAY 06/26/20   Loel Dubonnet, NP  vitamin B-12 (CYANOCOBALAMIN) 1000 MCG tablet Take 1,000 mcg by mouth daily.    [provider]    Allergies Patient has no known allergies.  Family History  Problem Relation Age of Onset   Heart disease Father        MI   Heart attack Father    Stroke Mother    Breast cancer Sister    Colon cancer Neg Hx    Prostate cancer Neg Hx     Social History Social History   Tobacco Use   Smoking status: Never   Smokeless tobacco: Never  Vaping Use   Vaping Use: Never used  Substance Use Topics   Alcohol use: No    Alcohol/week: 0.0 standard drinks   Drug use: No     Review of Systems  Constitutional: No fever/chills Eyes:  No discharge ENT: No upper respiratory complaints. Respiratory: no cough. No SOB/ use of accessory muscles to breath Gastrointestinal:   No nausea, no vomiting.  No diarrhea.  No constipation. Musculoskeletal: Negative for musculoskeletal pain. Skin: Patient has facial cellulitis.     ____________________________________________   PHYSICAL EXAM:  VITAL SIGNS: ED Triage Vitals  Enc Vitals Group     BP 10/15/20 1801 (!) 169/81     Pulse Rate 10/15/20 1801 69     Resp 10/15/20 1801 18     Temp 10/15/20 2039 98.6 F (37 C)     Temp Source 10/15/20 2039 Oral     SpO2 10/15/20 1801 96 %     Weight --      Height --      Head Circumference --      Peak Flow --      Pain Score 10/15/20 1750 0     Pain Loc --      Pain Edu? --      Excl. in [place]?  --      Constitutional: Alert and oriented. Well appearing and in no acute distress. Eyes: Conjunctivae are normal. PERRL. EOMI. no pain with extraocular eye muscle movement. Head: Atraumatic. ENT:      Nose: No congestion/rhinnorhea.      Mouth/Throat: Mucous membranes are moist.  Neck: No stridor.  No cervical spine tenderness to palpation. Cardiovascular: Normal rate, regular rhythm. Normal S1 and S2.  Good peripheral circulation. Respiratory: Normal respiratory effort without tachypnea or retractions. Lungs CTAB. Good air entry to  the bases with no decreased or absent breath sounds Gastrointestinal: Bowel sounds x 4 quadrants. Soft and nontender to palpation. No guarding or rigidity. No distention. Musculoskeletal: Full range of motion to all extremities. No obvious deformities noted Neurologic:  Normal for age. No gross focal neurologic deficits are appreciated.  Skin: Incision is fairly well approximated with evidence of purulence from wound site.  Patient has approximately 1 and half centimeters of surrounding cellulitis and some mild periorbital erythema of the left upper eyelid. Psychiatric: Mood and affect are normal for age. Speech and behavior are normal.   ____________________________________________   LABS (all labs ordered are listed, but only abnormal results are displayed)  Labs Reviewed  COMPREHENSIVE METABOLIC PANEL - Abnormal; Notable for the following components:      Result Value   Glucose, Bld 118 (*)    All other components within normal limits  CULTURE, BLOOD (ROUTINE X 2)  CULTURE, BLOOD (ROUTINE X 2)  CBC WITH DIFFERENTIAL/PLATELET  LACTIC ACID, PLASMA  LACTIC ACID, PLASMA   ____________________________________________  EKG   ____________________________________________  RADIOLOGY Unk Pinto, personally viewed and evaluated these images (plain radiographs) as part of my medical decision making, as well as reviewing the written report by the  radiologist.  CT Maxillofacial W Contrast  Result Date: 10/15/2020 CLINICAL DATA:  Recent seizure for basal cell carcinoma. Persistent bleeding. EXAM: CT MAXILLOFACIAL WITH CONTRAST TECHNIQUE: Multidetector CT imaging of the maxillofacial structures was performed with intravenous contrast. Multiplanar CT image reconstructions were also generated. CONTRAST:  90m OMNIPAQUE IOHEXOL 350 MG/ML SOLN COMPARISON:  None. FINDINGS: Osseous: No fracture or mandibular dislocation. No destructive process. Orbits: Negative. No traumatic or inflammatory finding. Sinuses: Clear. Soft tissues: Mild soft tissue thickening at the left frontal scalp. No fluid collection. Limited intracranial: Normal IMPRESSION: 1. Mild soft tissue thickening at the left frontal scalp surgical site without fluid collection. Electronically Signed   By: KUlyses JarredM.D.   On: 10/15/2020 20:51    ____________________________________________    PROCEDURES  Procedure(s) performed:     Procedures     Medications  iohexol (OMNIPAQUE) 350 MG/ML injection 50 mL (50 mLs Intravenous Contrast Given 10/15/20 2026)  cefTRIAXone (ROCEPHIN) 2 g in sodium chloride 0.9 % 100 mL IVPB (0 g Intravenous Stopped 10/15/20 2136)     ____________________________________________   INITIAL IMPRESSION / ASSESSMENT AND PLAN / ED COURSE  Pertinent labs & imaging results that were available during my care of the patient were reviewed by me and considered in my medical decision making (see chart for details).      Assessment and plan Facial cellulitis 85year old male presents to the emergency department with purulent cellulitis from a basal cell carcinoma removal procedure by dermatology.  Patient was hypertensive at triage but vital signs were otherwise reassuring.  Patient was alert, active and nontoxic-appearing.  CBC showed no evidence of leukocytosis.  CMP was within reference range.  Lactic was within reference range.  CT max face  showed no drainable fluid collection.  Patient has taken 1000 mg of Keflex on Friday and Saturday and has had 500 mg today.  We will give patient 2 g of IV Rocephin in the emergency department.  We will increase patient's Keflex to 3 times daily for the next 7 days and will start patient on doxycycline twice daily for MRSA coverage.  Patient and his wife were given strict return instructions to come back to the emergency department if erythema or facial edema worsens.  They have easy  access to the emergency department should symptoms change or worsen.  I reviewed my plan of care with attending, Dr. Joni Fears who agrees with plan at this time.     ____________________________________________  FINAL CLINICAL IMPRESSION(S) / ED DIAGNOSES  Final diagnoses:  Facial cellulitis      NEW MEDICATIONS STARTED DURING THIS VISIT:  ED Discharge Orders          Ordered    cephALEXin (KEFLEX) 500 MG capsule  3 times daily        10/15/20 2233    doxycycline (ADOXA) 100 MG tablet  2 times daily        10/15/20 2233                This chart was dictated using voice recognition software/Dragon. Despite best efforts to proofread, errors can occur which can change the meaning. Any change was purely unintentional.     Karren Cobble 10/15/20 2312    Carrie Mew, MD 10/15/20 2358

## 2020-10-16 ENCOUNTER — Encounter: Payer: Self-pay | Admitting: Internal Medicine

## 2020-10-16 DIAGNOSIS — T148XXA Other injury of unspecified body region, initial encounter: Secondary | ICD-10-CM | POA: Insufficient documentation

## 2020-10-16 NOTE — Assessment & Plan Note (Signed)
Saw hematology.  Recommended f/u 2-3x/year.  Last check wnl.  Follow.

## 2020-10-16 NOTE — Assessment & Plan Note (Signed)
On crestor.  Low cholesterol diet and exercise.  Follow lipid panel and liver function tests.   

## 2020-10-16 NOTE — Assessment & Plan Note (Signed)
Blood pressure remains elevated.  Increase lisinopril to '40mg'$  q day.  Follow pressures.  Follow metabolic panel.  Continue current dose of metoprolol.

## 2020-10-16 NOTE — Assessment & Plan Note (Signed)
Previous cath - 2 vessel disease.  Risk factor modification.  Continue crestor.  

## 2020-10-16 NOTE — Assessment & Plan Note (Signed)
Continue crestor 

## 2020-10-16 NOTE — Assessment & Plan Note (Signed)
S/p left ICA CEA.  Followed by Dr Lucky Cowboy.  Continue risk factor modification.  Continue daily aspirin.

## 2020-10-16 NOTE — Assessment & Plan Note (Signed)
Addendum: discussed with [facility] - 10/2020.  They will arrange f/u.

## 2020-10-16 NOTE — Assessment & Plan Note (Signed)
S/p basal cell removal.  Problem with bleeding as outlined.  Evaluated in ER.  Surgicel placed.  Bandage in place.  No oozing.  Keep f/u with dermatology this week.

## 2020-10-17 ENCOUNTER — Telehealth: Payer: Self-pay | Admitting: Internal Medicine

## 2020-10-17 ENCOUNTER — Other Ambulatory Visit: Payer: Self-pay

## 2020-10-17 ENCOUNTER — Emergency Department
Admission: EM | Admit: 2020-10-17 | Discharge: 2020-10-17 | Disposition: A | Discharge status: Home / Self Care | Payer: Medicare HMO | Attending: Emergency Medicine | Admitting: Emergency Medicine

## 2020-10-17 DIAGNOSIS — Z5189 Encounter for other specified aftercare: Secondary | ICD-10-CM

## 2020-10-17 DIAGNOSIS — Z7982 Long term (current) use of aspirin: Secondary | ICD-10-CM | POA: Insufficient documentation

## 2020-10-17 DIAGNOSIS — Z79899 Other long term (current) drug therapy: Secondary | ICD-10-CM | POA: Insufficient documentation

## 2020-10-17 DIAGNOSIS — I2510 Atherosclerotic heart disease of native coronary artery without angina pectoris: Secondary | ICD-10-CM | POA: Diagnosis not present

## 2020-10-17 DIAGNOSIS — Z4801 Encounter for change or removal of surgical wound dressing: Secondary | ICD-10-CM | POA: Diagnosis not present

## 2020-10-17 DIAGNOSIS — Z7902 Long term (current) use of antithrombotics/antiplatelets: Secondary | ICD-10-CM | POA: Insufficient documentation

## 2020-10-17 DIAGNOSIS — I10 Essential (primary) hypertension: Secondary | ICD-10-CM | POA: Insufficient documentation

## 2020-10-17 DIAGNOSIS — Z4800 Encounter for change or removal of nonsurgical wound dressing: Secondary | ICD-10-CM | POA: Insufficient documentation

## 2020-10-17 NOTE — ED Provider Notes (Signed)
[facility] Emergency Department Provider Note   ____________________________________________    I have reviewed the triage vital signs and the nursing notes.   HISTORY  Chief Complaint Wound Check     HPI Brett Riley is a 85 y.o. male who reports that he received a recorded message called this morning for which she had answer yes or no questions and eventually recommend that he be seen by his PCP or come back to the emergency department.  Unclear what this call was.  He reports he is feeling well, afebrile.  Reports wound is gradually improving, he is on antibiotics and taking as prescribed.  He has a pain medication from his dermatologist.  No new complaints.  Blood cultures reviewed and negative  Past Medical History:  Diagnosis Date   Carotid arterial disease ([place])    a. 02/2016 L CEA; b. [id number] U/S: patent LICA, [id number] RICA.   Coronary artery disease    a. 01/2016 MV: mild apical/basal inferior, apical lateral, mid anterolateral, and mid inferolateral ischemia. EF 57%; b. 02/2016 Cath: LM 40ost, LAD 70p/m, 20d, D1 95 (small), LCX nl, RCA 23m RPDA 90 (small), EF 55-65%-->med Rx. Rec CABG for recurrent symptoms.   History of echocardiogram    a. 02/2016 Echo: EF 50-55%, no rwma, mild MR.   Hypercholesterolemia    Hypertension     Patient Active Problem List   Diagnosis Date Noted   Open wound of skin 10/16/2020   Aortic atherosclerosis ([place] 08/27/2020   Leg pain 01/10/2020   Fever 10/03/2019   Hypertension 06/01/2019   Aneurysm artery, iliac common (HG[place] 06/01/2018   Dizziness 08/04/2016   Abdominal bruit 08/04/2016   Carotid stenosis, asymptomatic, left 02/15/2016   CAD (coronary artery disease)    Health care maintenance 09/11/2014   Hip region mass 12/12/2013   Soft tissue mass 11/28/2013   Fatigue 07/28/2013   Environmental allergies 07/28/2013   Carotid stenosis 01/21/2013   Thrombocytopenia ([place] 07/26/2012   Anemia  07/26/2012   Essential hypertension, benign 05/24/2012   Hyperlipidemia 05/24/2012    Past Surgical History:  Procedure Laterality Date   CARDIAC CATHETERIZATION N/A 01/19/2016   Procedure: Left Heart Cath and Coronary Angiography;  Surgeon: MWellington Hampshire MD;  [facility];  Service: Cardiovascular;  Laterality: N/A;   ENDARTERECTOMY Left 02/15/2016   Procedure: ENDARTERECTOMY CAROTID;  Surgeon: JAlgernon Huxley MD;  Location: [facility];  Service: Vascular;  Laterality: Left;    Prior to Admission medications   Medication Sig Start Date End Date Taking? Authorizing Provider  aspirin 81 MG tablet Take 81 mg by mouth at bedtime.     [provider]  cephALEXin (KEFLEX) 500 MG capsule Take 1 capsule (500 mg total) by mouth 3 (three) times daily for 7 days. 10/15/20 10/22/20  WLannie Fields PA-C  clopidogrel (PLAVIX) 75 MG tablet Take 1 tablet (75 mg total) by mouth daily with breakfast. 08/15/20   SEinar Pheasant MD  doxycycline (ADOXA) 100 MG tablet Take 1 tablet (100 mg total) by mouth 2 (two) times daily for 7 days. 10/15/20 10/22/20  WLannie Fields PA-C  lisinopril (ZESTRIL) 40 MG tablet Take 1 tablet (40 mg total) by mouth daily. 10/09/20   SEinar Pheasant MD  metoprolol succinate (TOPROL-XL) 100 MG 24 hr tablet TAKE 1 TABLET BY MOUTH EVERY DAY WITH A MEAL 02/23/20   SEinar Pheasant MD  Multiple Vitamins-Iron (MULTIVITAMINS WITH IRON) TABS Take 1 tablet by mouth daily.  [provider]  nitroGLYCERIN (NITROSTAT) 0.4 MG SL tablet Place 1 tablet (0.4 mg total) under the tongue every 5 (five) minutes as needed. 01/23/16   Wende Bushy, MD  rosuvastatin (CRESTOR) 10 MG tablet TAKE 1 TABLET BY MOUTH EVERY DAY 06/26/20   Loel Dubonnet, NP  vitamin B-12 (CYANOCOBALAMIN) 1000 MCG tablet Take 1,000 mcg by mouth daily.    [provider]     Allergies Patient has no known allergies.  Family History  Problem Relation Age of Onset   Heart  disease Father        MI   Heart attack Father    Stroke Mother    Breast cancer Sister    Colon cancer Neg Hx    Prostate cancer Neg Hx     Social History Social History   Tobacco Use   Smoking status: Never   Smokeless tobacco: Never  Vaping Use   Vaping Use: Never used  Substance Use Topics   Alcohol use: No    Alcohol/week: 0.0 standard drinks   Drug use: No    Review of Systems  Constitutional: No fever/chills      Skin: Healing wound Neurological: Negative for headaches     ____________________________________________   PHYSICAL EXAM:  VITAL SIGNS: ED Triage Vitals  Enc Vitals Group     BP 10/17/20 2007 (!) 195/91     Pulse Rate 10/17/20 2007 75     Resp 10/17/20 2007 18     Temp 10/17/20 2007 98.6 F (37 C)     Temp src --      SpO2 10/17/20 2007 98 %     Weight 10/17/20 2005 87.9 kg (193 lb 12.6 oz)     Height 10/17/20 2005 1.829 m (6')     Head Circumference --      Peak Flow --      Pain Score 10/17/20 2005 2     Pain Loc --      Pain Edu? --      Excl. in [place]? --      Constitutional: Alert and oriented. No acute distress. Pleasant and interactive Eyes: Conjunctivae are normal.  Head: Atraumatic. Nose: No congestion/rhinnorhea. Mouth/Throat: Mucous membranes are moist.   Cardiovascular: Normal rate, regular rhythm.  Respiratory: Normal respiratory effort.  No retractions. Genitourinary: deferred Musculoskeletal: No lower extremity tenderness nor edema.   Neurologic:  Normal speech and language. No gross focal neurologic deficits are appreciated.   Skin:  Skin is warm, dry.  Forehead wound with minimal erythema, appears improved from description from prior note   ____________________________________________   LABS (all labs ordered are listed, but only abnormal results are displayed)  Labs Reviewed - No data to  display ____________________________________________  EKG   ____________________________________________  RADIOLOGY   ____________________________________________   PROCEDURES  Procedure(s) performed: No  Procedures   Critical Care performed: No ____________________________________________   INITIAL IMPRESSION / ASSESSMENT AND PLAN / ED COURSE  Pertinent labs & imaging results that were available during my care of the patient were reviewed by me and considered in my medical decision making (see chart for details).   Reviewed blood cultures, no growth x2 days.  Patient afebrile well-appearing, he reports improvement in wound, no further work-up at this time.   ____________________________________________   FINAL CLINICAL IMPRESSION(S) / ED DIAGNOSES  Final diagnoses:  Visit for wound check      NEW MEDICATIONS STARTED DURING THIS VISIT:  New Prescriptions   No medications on file  Note:  This document was prepared using Dragon voice recognition software and may include unintentional dictation errors.    Lavonia Drafts, MD 10/17/20 2030

## 2020-10-17 NOTE — Telephone Encounter (Signed)
FYI-  Called patient and spoke with wife. Pt was seen in ED for facial cellulitis 10/15/20. Started on 2 abx and advised to return to ED if symptoms do not improve. Wife states that he has a little more energy but has not seen much improvement in symptoms other wise. Pt is still in a lot of pain. Wife is going to take patient back to the ED for re-evaluation.

## 2020-10-17 NOTE — Telephone Encounter (Signed)
Patient was seen at the ED. [place] called her today and said patient needs a follow up with either [place] or with his provider. Patient's wife said he is not doing better and is in a lot of pain.

## 2020-10-17 NOTE — ED Triage Notes (Addendum)
Pt had procedure done at dermatologist for a biopsy on his forehead, pt was seen here following that for bleeding and infection, pt is back today because he said he had a call telling him to come back in for a recheck, pt has no complaints. Pt reports no signs of infection

## 2020-10-20 LAB — CULTURE, BLOOD (ROUTINE X 2)
Culture: NO GROWTH
Culture: NO GROWTH
Special Requests: ADEQUATE
Special Requests: ADEQUATE

## 2020-10-22 LAB — AEROBIC/ANAEROBIC CULTURE W GRAM STAIN (SURGICAL/DEEP WOUND)

## 2020-10-23 DIAGNOSIS — Z85828 Personal history of other malignant neoplasm of skin: Secondary | ICD-10-CM | POA: Diagnosis not present

## 2020-11-14 DIAGNOSIS — H2513 Age-related nuclear cataract, bilateral: Secondary | ICD-10-CM | POA: Diagnosis not present

## 2020-12-14 DIAGNOSIS — H90A22 Sensorineural hearing loss, unilateral, left ear, with restricted hearing on the contralateral side: Secondary | ICD-10-CM | POA: Diagnosis not present

## 2020-12-14 DIAGNOSIS — H6123 Impacted cerumen, bilateral: Secondary | ICD-10-CM | POA: Diagnosis not present

## 2020-12-15 ENCOUNTER — Other Ambulatory Visit: Payer: Self-pay | Admitting: Otolaryngology

## 2020-12-15 DIAGNOSIS — H9041 Sensorineural hearing loss, unilateral, right ear, with unrestricted hearing on the contralateral side: Secondary | ICD-10-CM

## 2020-12-26 ENCOUNTER — Other Ambulatory Visit: Payer: Self-pay

## 2020-12-26 ENCOUNTER — Ambulatory Visit
Admission: RE | Admit: 2020-12-26 | Discharge: 2020-12-26 | Disposition: A | Discharge status: Home / Self Care | Payer: Medicare HMO | Source: Ambulatory Visit | Attending: Otolaryngology | Admitting: Otolaryngology

## 2020-12-26 DIAGNOSIS — H9041 Sensorineural hearing loss, unilateral, right ear, with unrestricted hearing on the contralateral side: Secondary | ICD-10-CM | POA: Insufficient documentation

## 2020-12-26 DIAGNOSIS — H9190 Unspecified hearing loss, unspecified ear: Secondary | ICD-10-CM | POA: Diagnosis not present

## 2020-12-26 IMAGING — MR MR BRAIN/IAC WO/W CM
10 of 13 series · 26 of 48 positions shown · IV contrast (gadavist)
Comparison: None.

CLINICAL DATA: Chronic hearing loss

EXAM:
MRI HEAD WITHOUT AND WITH CONTRAST
TECHNIQUE: Multiplanar, multiecho pulse sequences of the brain and surrounding
structures were obtained without and with intravenous contrast.
CONTRAST:  10mL GADAVIST GADOBUTROL 1 MMOL/ML IV SOLN

[Series 5: T1 · sagittal · 5.0mm · 0.62mm/px · 1 of 25 slices shown (1 of 3)]
[im 1/25]
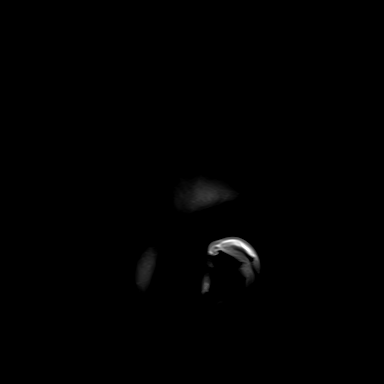

[Series 6: ax dwi_tracew · axial · 3.0mm · 0.60mm/px · z∈[-44,+110]mm · 4 of 48 slices shown]
[im 1/48]
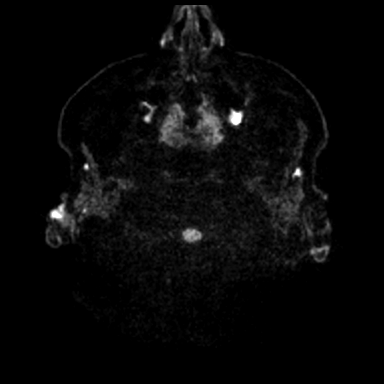
[im 16/48]
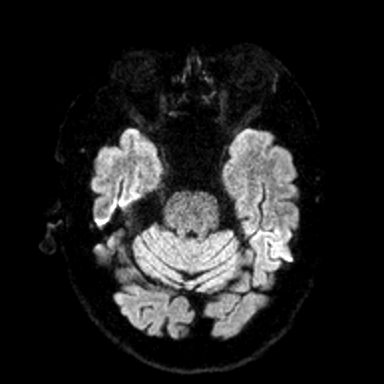
[im 32/48]
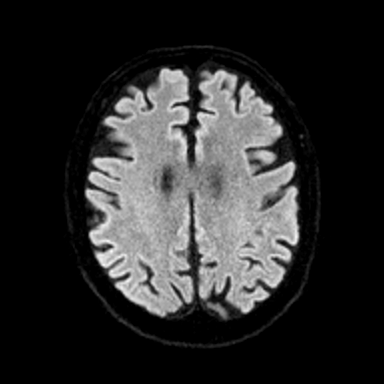
[im 48/48]
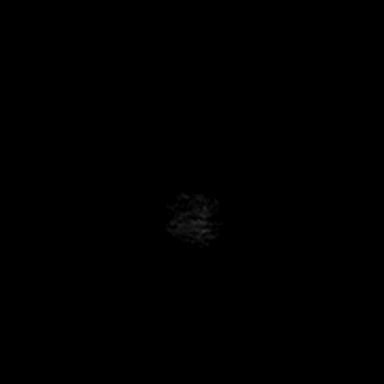

[Series 7: ax dwi_adc · axial · 3.0mm · 0.60mm/px · z∈[-44,+57]mm · 3 of 48 slices shown]
[im 1/48]
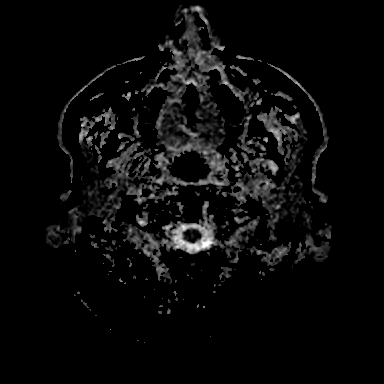
[im 16/48]
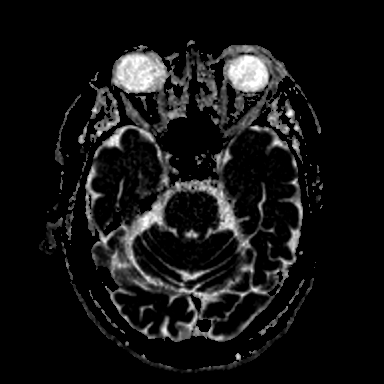
[im 32/48]
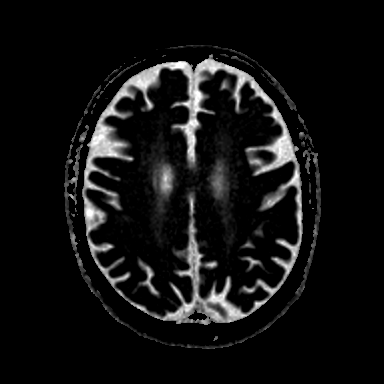

[Series 8: T2 · axial · 5.0mm · 0.53mm/px · z∈[-43,+112]mm · 2 of 27 slices shown]
[im 1/27]
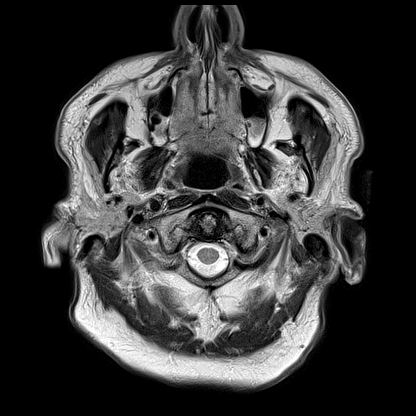
[im 27/27]
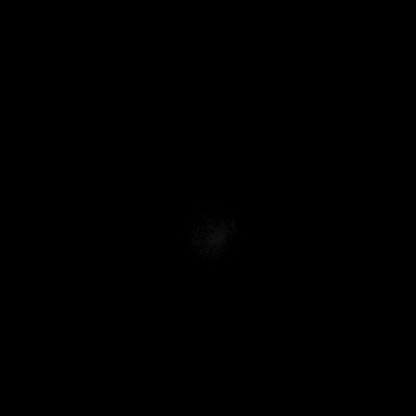

[Series 13: FLAIR · axial · 3.0mm · 0.53mm/px · z∈[-46,+115]mm · 4 of 55 slices shown]
[im 1/55]
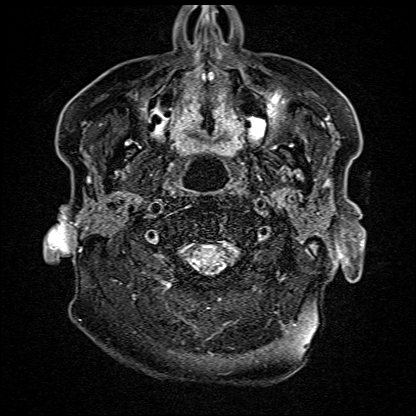
[im 19/55]
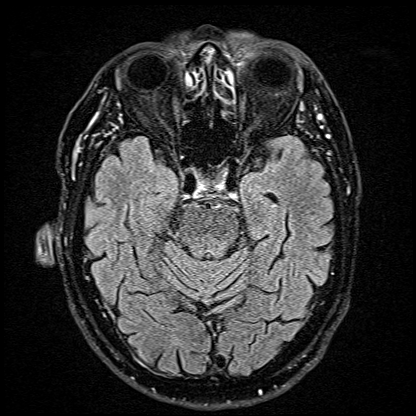
[im 37/55]
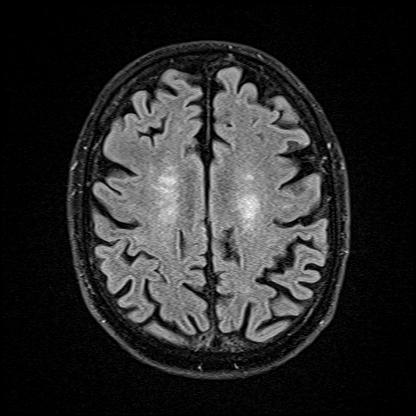
[im 55/55]
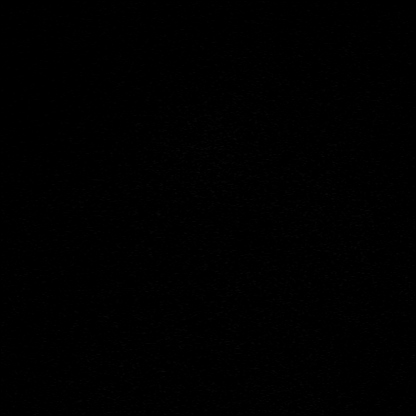

[Series 15: T1 · axial · non-contrast · 3.0mm · 0.21mm/px · 1 of 15 slices shown (2 of 3)]
[im 1/15]
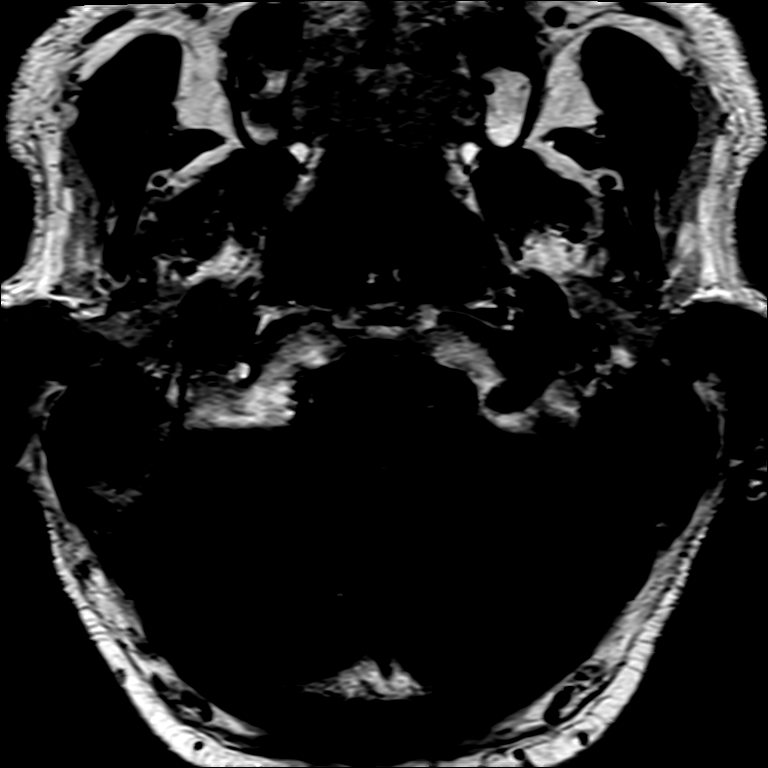

[Series 16: T1 · coronal · non-contrast · 3.0mm · 0.21mm/px · 1 of 13 slices shown (3 of 3)]
[im 1/13]
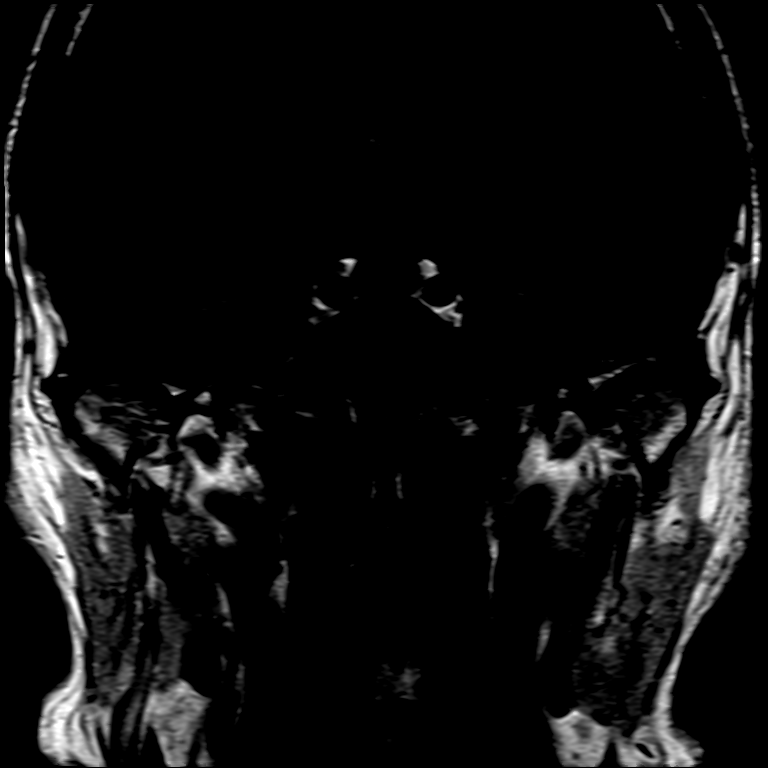

[Series 17: T1 post-contrast · axial · 3.0mm · 0.21mm/px · 1 of 15 slices shown (1 of 3)]
[im 1/15]
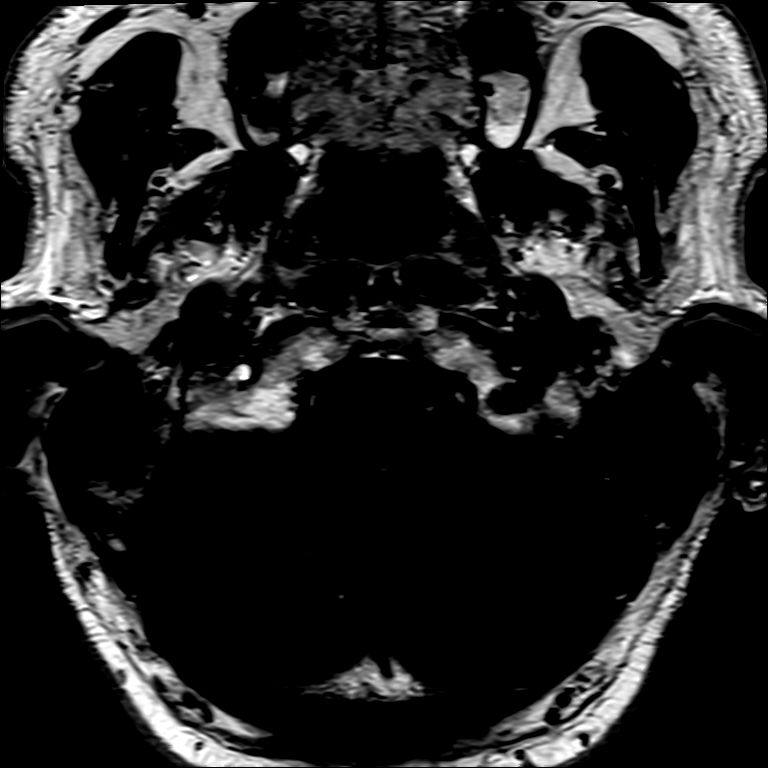

[Series 18: T1 post-contrast · coronal · 3.0mm · 0.21mm/px · 1 of 13 slices shown (2 of 3)]
[im 1/13]
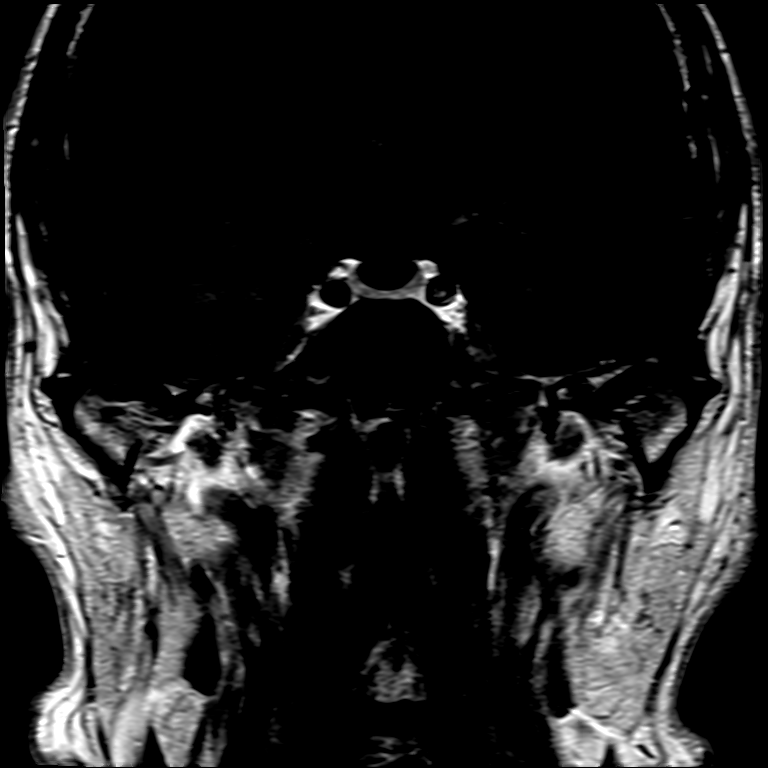

[Series 19: T1 post-contrast · axial · 1.0mm · 0.98mm/px · z∈[-51,+123]mm · 8 of 175 slices shown (3 of 3)]
[im 1/175]
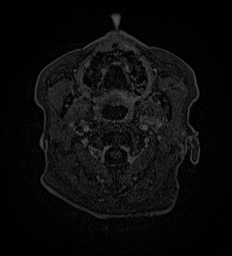
[im 27/175]
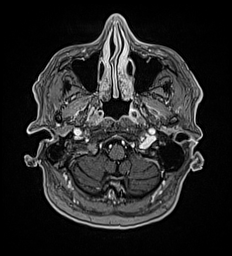
[im 54/175]
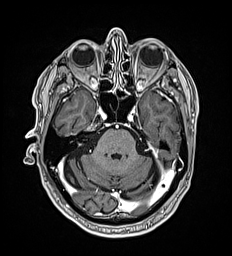
[im 81/175]
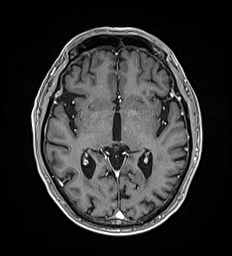
[im 94/175]
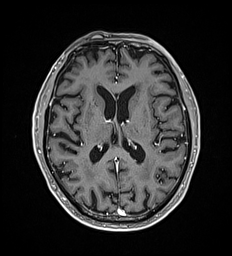
[im 121/175]
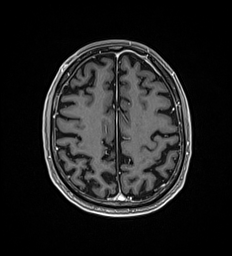
[im 148/175]
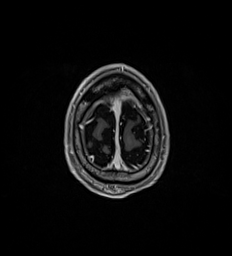
[im 175/175]
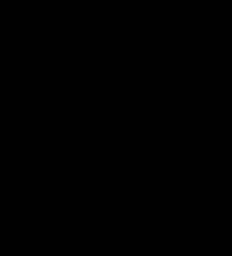

[26 of 48 positions shown; findings below may reference images not displayed]

FINDINGS: Brain: No acute infarct, mass effect or extra-axial collection. No
acute or chronic hemorrhage. Normal white matter signal, parenchymal
volume and CSF spaces. The midline structures are normal. There is
no cerebellopontine angle mass. The cochleae and semicircular canals
are normal. No focal abnormality along the course of the 7th and 8th
cranial nerves. Normal porus acusticus and vestibular aqueduct
bilaterally. No abnormal contrast enhancement.

Vascular: Major flow voids are preserved.

Skull and upper cervical spine: Normal calvarium and skull base.
Visualized upper cervical spine and soft tissues are normal.

Sinuses/Orbits:No paranasal sinus fluid levels or advanced mucosal
thickening. No mastoid or middle ear effusion. Normal orbits.
IMPRESSION: Normal MRI of the brain and internal auditory canals.

## 2020-12-26 MED ORDER — GADOBUTROL 1 MMOL/ML IV SOLN
9.0000 mL | Freq: Once | INTRAVENOUS | Status: AC | PRN
  Administered 2020-12-26: 10 mL via INTRAVENOUS

## 2021-02-06 ENCOUNTER — Other Ambulatory Visit: Payer: Self-pay | Admitting: Internal Medicine

## 2021-02-07 DIAGNOSIS — M5432 Sciatica, left side: Secondary | ICD-10-CM | POA: Diagnosis not present

## 2021-02-07 DIAGNOSIS — M9905 Segmental and somatic dysfunction of pelvic region: Secondary | ICD-10-CM | POA: Diagnosis not present

## 2021-02-07 DIAGNOSIS — M5416 Radiculopathy, lumbar region: Secondary | ICD-10-CM | POA: Diagnosis not present

## 2021-02-07 DIAGNOSIS — M9903 Segmental and somatic dysfunction of lumbar region: Secondary | ICD-10-CM | POA: Diagnosis not present

## 2021-02-09 DIAGNOSIS — M5416 Radiculopathy, lumbar region: Secondary | ICD-10-CM | POA: Diagnosis not present

## 2021-02-09 DIAGNOSIS — M9905 Segmental and somatic dysfunction of pelvic region: Secondary | ICD-10-CM | POA: Diagnosis not present

## 2021-02-09 DIAGNOSIS — M9903 Segmental and somatic dysfunction of lumbar region: Secondary | ICD-10-CM | POA: Diagnosis not present

## 2021-02-09 DIAGNOSIS — M5432 Sciatica, left side: Secondary | ICD-10-CM | POA: Diagnosis not present

## 2021-02-12 DIAGNOSIS — M9905 Segmental and somatic dysfunction of pelvic region: Secondary | ICD-10-CM | POA: Diagnosis not present

## 2021-02-12 DIAGNOSIS — M9903 Segmental and somatic dysfunction of lumbar region: Secondary | ICD-10-CM | POA: Diagnosis not present

## 2021-02-12 DIAGNOSIS — M5432 Sciatica, left side: Secondary | ICD-10-CM | POA: Diagnosis not present

## 2021-02-12 DIAGNOSIS — M5416 Radiculopathy, lumbar region: Secondary | ICD-10-CM | POA: Diagnosis not present

## 2021-02-14 DIAGNOSIS — M9905 Segmental and somatic dysfunction of pelvic region: Secondary | ICD-10-CM | POA: Diagnosis not present

## 2021-02-14 DIAGNOSIS — M5432 Sciatica, left side: Secondary | ICD-10-CM | POA: Diagnosis not present

## 2021-02-14 DIAGNOSIS — M9903 Segmental and somatic dysfunction of lumbar region: Secondary | ICD-10-CM | POA: Diagnosis not present

## 2021-02-14 DIAGNOSIS — M5416 Radiculopathy, lumbar region: Secondary | ICD-10-CM | POA: Diagnosis not present

## 2021-02-16 DIAGNOSIS — M9903 Segmental and somatic dysfunction of lumbar region: Secondary | ICD-10-CM | POA: Diagnosis not present

## 2021-02-16 DIAGNOSIS — M9905 Segmental and somatic dysfunction of pelvic region: Secondary | ICD-10-CM | POA: Diagnosis not present

## 2021-02-16 DIAGNOSIS — M5416 Radiculopathy, lumbar region: Secondary | ICD-10-CM | POA: Diagnosis not present

## 2021-02-16 DIAGNOSIS — M5432 Sciatica, left side: Secondary | ICD-10-CM | POA: Diagnosis not present

## 2021-02-19 DIAGNOSIS — M9903 Segmental and somatic dysfunction of lumbar region: Secondary | ICD-10-CM | POA: Diagnosis not present

## 2021-02-19 DIAGNOSIS — M9905 Segmental and somatic dysfunction of pelvic region: Secondary | ICD-10-CM | POA: Diagnosis not present

## 2021-02-19 DIAGNOSIS — M5416 Radiculopathy, lumbar region: Secondary | ICD-10-CM | POA: Diagnosis not present

## 2021-02-19 DIAGNOSIS — M5432 Sciatica, left side: Secondary | ICD-10-CM | POA: Diagnosis not present

## 2021-02-21 DIAGNOSIS — M5416 Radiculopathy, lumbar region: Secondary | ICD-10-CM | POA: Diagnosis not present

## 2021-02-21 DIAGNOSIS — M9903 Segmental and somatic dysfunction of lumbar region: Secondary | ICD-10-CM | POA: Diagnosis not present

## 2021-02-21 DIAGNOSIS — M9905 Segmental and somatic dysfunction of pelvic region: Secondary | ICD-10-CM | POA: Diagnosis not present

## 2021-02-21 DIAGNOSIS — M5432 Sciatica, left side: Secondary | ICD-10-CM | POA: Diagnosis not present

## 2021-02-27 ENCOUNTER — Other Ambulatory Visit: Payer: Self-pay | Admitting: Internal Medicine

## 2021-02-28 DIAGNOSIS — M5432 Sciatica, left side: Secondary | ICD-10-CM | POA: Diagnosis not present

## 2021-02-28 DIAGNOSIS — M5416 Radiculopathy, lumbar region: Secondary | ICD-10-CM | POA: Diagnosis not present

## 2021-02-28 DIAGNOSIS — M9905 Segmental and somatic dysfunction of pelvic region: Secondary | ICD-10-CM | POA: Diagnosis not present

## 2021-02-28 DIAGNOSIS — M9903 Segmental and somatic dysfunction of lumbar region: Secondary | ICD-10-CM | POA: Diagnosis not present

## 2021-03-07 DIAGNOSIS — M9903 Segmental and somatic dysfunction of lumbar region: Secondary | ICD-10-CM | POA: Diagnosis not present

## 2021-03-07 DIAGNOSIS — M5416 Radiculopathy, lumbar region: Secondary | ICD-10-CM | POA: Diagnosis not present

## 2021-03-07 DIAGNOSIS — M9905 Segmental and somatic dysfunction of pelvic region: Secondary | ICD-10-CM | POA: Diagnosis not present

## 2021-03-07 DIAGNOSIS — M5432 Sciatica, left side: Secondary | ICD-10-CM | POA: Diagnosis not present

## 2021-03-14 DIAGNOSIS — M9905 Segmental and somatic dysfunction of pelvic region: Secondary | ICD-10-CM | POA: Diagnosis not present

## 2021-03-14 DIAGNOSIS — M5416 Radiculopathy, lumbar region: Secondary | ICD-10-CM | POA: Diagnosis not present

## 2021-03-14 DIAGNOSIS — M5432 Sciatica, left side: Secondary | ICD-10-CM | POA: Diagnosis not present

## 2021-03-14 DIAGNOSIS — M9903 Segmental and somatic dysfunction of lumbar region: Secondary | ICD-10-CM | POA: Diagnosis not present

## 2021-03-21 DIAGNOSIS — M9905 Segmental and somatic dysfunction of pelvic region: Secondary | ICD-10-CM | POA: Diagnosis not present

## 2021-03-21 DIAGNOSIS — M5416 Radiculopathy, lumbar region: Secondary | ICD-10-CM | POA: Diagnosis not present

## 2021-03-21 DIAGNOSIS — M9903 Segmental and somatic dysfunction of lumbar region: Secondary | ICD-10-CM | POA: Diagnosis not present

## 2021-03-21 DIAGNOSIS — M5432 Sciatica, left side: Secondary | ICD-10-CM | POA: Diagnosis not present

## 2021-04-04 DIAGNOSIS — M9905 Segmental and somatic dysfunction of pelvic region: Secondary | ICD-10-CM | POA: Diagnosis not present

## 2021-04-04 DIAGNOSIS — M5432 Sciatica, left side: Secondary | ICD-10-CM | POA: Diagnosis not present

## 2021-04-04 DIAGNOSIS — M9903 Segmental and somatic dysfunction of lumbar region: Secondary | ICD-10-CM | POA: Diagnosis not present

## 2021-04-04 DIAGNOSIS — M5416 Radiculopathy, lumbar region: Secondary | ICD-10-CM | POA: Diagnosis not present

## 2021-04-09 ENCOUNTER — Encounter: Payer: Self-pay | Admitting: Emergency Medicine

## 2021-04-09 ENCOUNTER — Other Ambulatory Visit: Payer: Self-pay

## 2021-04-09 ENCOUNTER — Emergency Department
Admission: EM | Admit: 2021-04-09 | Discharge: 2021-04-09 | Disposition: A | Discharge status: Home / Self Care | Payer: Medicare HMO | Attending: Emergency Medicine | Admitting: Emergency Medicine

## 2021-04-09 ENCOUNTER — Encounter: Payer: Self-pay | Admitting: Internal Medicine

## 2021-04-09 ENCOUNTER — Emergency Department: Payer: Medicare HMO

## 2021-04-09 DIAGNOSIS — R9389 Abnormal findings on diagnostic imaging of other specified body structures: Secondary | ICD-10-CM | POA: Diagnosis not present

## 2021-04-09 DIAGNOSIS — I10 Essential (primary) hypertension: Secondary | ICD-10-CM | POA: Diagnosis not present

## 2021-04-09 DIAGNOSIS — R935 Abnormal findings on diagnostic imaging of other abdominal regions, including retroperitoneum: Secondary | ICD-10-CM | POA: Insufficient documentation

## 2021-04-09 DIAGNOSIS — I2510 Atherosclerotic heart disease of native coronary artery without angina pectoris: Secondary | ICD-10-CM | POA: Insufficient documentation

## 2021-04-09 DIAGNOSIS — K5730 Diverticulosis of large intestine without perforation or abscess without bleeding: Secondary | ICD-10-CM | POA: Diagnosis not present

## 2021-04-09 DIAGNOSIS — I700 Atherosclerosis of aorta: Secondary | ICD-10-CM | POA: Insufficient documentation

## 2021-04-09 DIAGNOSIS — M545 Low back pain, unspecified: Secondary | ICD-10-CM | POA: Diagnosis not present

## 2021-04-09 DIAGNOSIS — K769 Liver disease, unspecified: Secondary | ICD-10-CM | POA: Diagnosis not present

## 2021-04-09 DIAGNOSIS — M810 Age-related osteoporosis without current pathological fracture: Secondary | ICD-10-CM | POA: Diagnosis not present

## 2021-04-09 DIAGNOSIS — K449 Diaphragmatic hernia without obstruction or gangrene: Secondary | ICD-10-CM | POA: Diagnosis not present

## 2021-04-09 DIAGNOSIS — N200 Calculus of kidney: Secondary | ICD-10-CM | POA: Insufficient documentation

## 2021-04-09 DIAGNOSIS — M899 Disorder of bone, unspecified: Secondary | ICD-10-CM | POA: Insufficient documentation

## 2021-04-09 DIAGNOSIS — R1031 Right lower quadrant pain: Secondary | ICD-10-CM | POA: Diagnosis present

## 2021-04-09 LAB — COMPREHENSIVE METABOLIC PANEL WITH GFR
ALT: 20 U/L (ref 0–44)
AST: 26 U/L (ref 15–41)
Albumin: 4.5 g/dL (ref 3.5–5.0)
Alkaline Phosphatase: 73 U/L (ref 38–126)
Anion gap: 6 (ref 5–15)
BUN: 22 mg/dL (ref 8–23)
CO2: 26 mmol/L (ref 22–32)
Calcium: 9.5 mg/dL (ref 8.9–10.3)
Chloride: 105 mmol/L (ref 98–111)
Creatinine, Ser: 0.98 mg/dL (ref 0.61–1.24)
GFR, Estimated: >60 mL/min
Glucose, Bld: 119 mg/dL — ABNORMAL HIGH (ref 70–99)
Potassium: 4.1 mmol/L (ref 3.5–5.1)
Sodium: 137 mmol/L (ref 135–145)
Total Bilirubin: 0.8 mg/dL (ref 0.3–1.2)
Total Protein: 8 g/dL (ref 6.5–8.1)

## 2021-04-09 LAB — URINALYSIS, COMPLETE (UACMP) WITH MICROSCOPIC
Bacteria, UA: NONE SEEN
Bilirubin Urine: NEGATIVE
Glucose, UA: NEGATIVE mg/dL
Leukocytes,Ua: NEGATIVE
Nitrite: NEGATIVE
Protein, ur: 30 mg/dL — AB
Specific Gravity, Urine: 1.025 (ref 1.005–1.030)
Squamous Epithelial / HPF: NONE SEEN (ref 0–5)
pH: 5 (ref 5.0–8.0)

## 2021-04-09 LAB — CBC WITH DIFFERENTIAL/PLATELET
Abs Immature Granulocytes: 0.02 10*3/uL (ref 0.00–0.07)
Basophils Absolute: 0 10*3/uL (ref 0.0–0.1)
Basophils Relative: 0 %
Eosinophils Absolute: 0.1 10*3/uL (ref 0.0–0.5)
Eosinophils Relative: 2 %
HCT: 41.6 % (ref 39.0–52.0)
Hemoglobin: 13.7 g/dL (ref 13.0–17.0)
Immature Granulocytes: 0 %
Lymphocytes Relative: 14 %
Lymphs Abs: 1.1 10*3/uL (ref 0.7–4.0)
MCH: 32.3 pg (ref 26.0–34.0)
MCHC: 32.9 g/dL (ref 30.0–36.0)
MCV: 98.1 fL (ref 80.0–100.0)
Monocytes Absolute: 0.4 10*3/uL (ref 0.1–1.0)
Monocytes Relative: 5 %
Neutro Abs: 6.3 10*3/uL (ref 1.7–7.7)
Neutrophils Relative %: 79 %
Platelets: 167 10*3/uL (ref 150–400)
RBC: 4.24 MIL/uL (ref 4.22–5.81)
RDW: 13.3 % (ref 11.5–15.5)
WBC: 7.9 10*3/uL (ref 4.0–10.5)
nRBC: 0 % (ref 0.0–0.2)

## 2021-04-09 LAB — LIPASE, BLOOD: Lipase: 28 U/L (ref 11–51)

## 2021-04-09 IMAGING — CT CT RENAL STONE PROTOCOL
2 of 4 series · 15 of 46 positions shown, 17 images · non-contrast
Comparison: None

CLINICAL DATA: Right flank pain since this morning with dysuria.
History of renal stones.



[Series 2: stone full standard · axial · 0.75mm/px · z∈[-973,-533]mm · 12 of 98 slices shown, 14 images]
[im 5/98  soft-tissue]
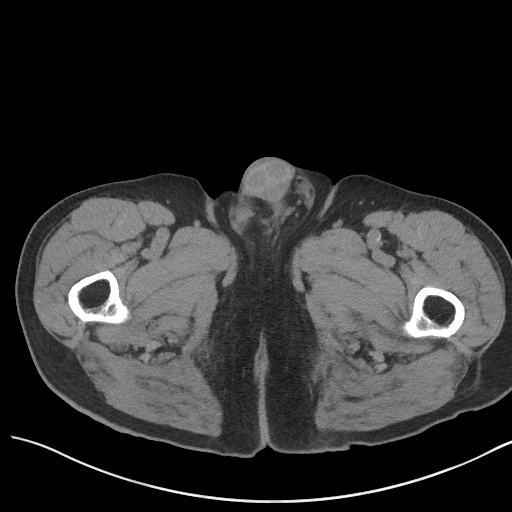
[im 5/98  bone]
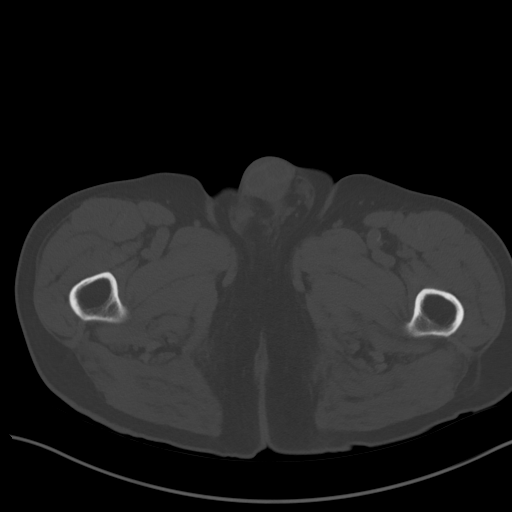
[im 13/98  soft-tissue]
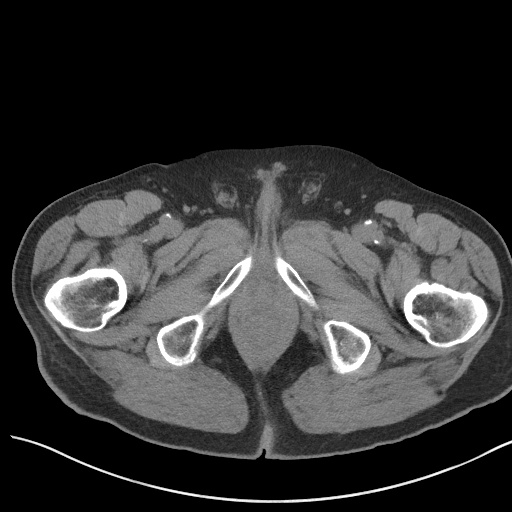
[im 21/98  soft-tissue]
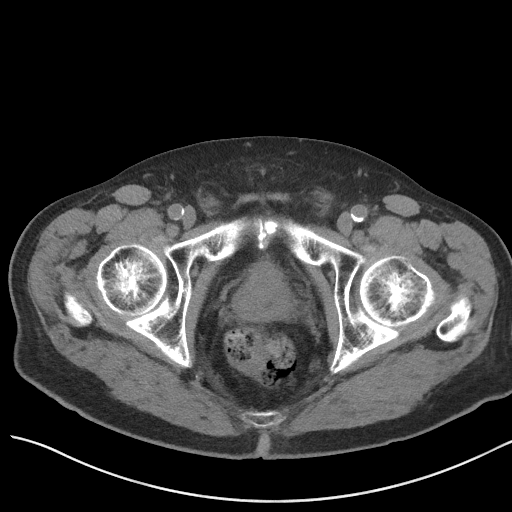
[im 29/98  soft-tissue]
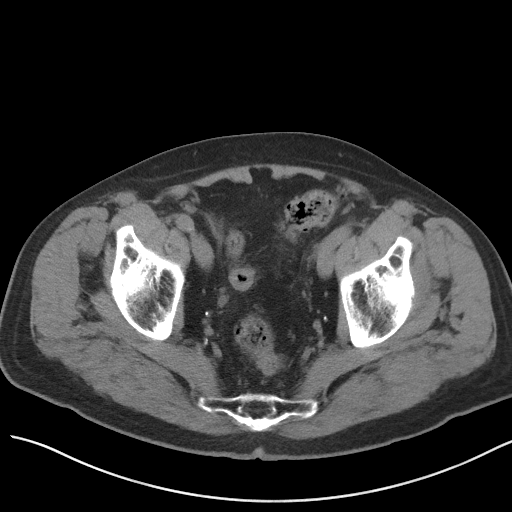
[im 37/98  soft-tissue]
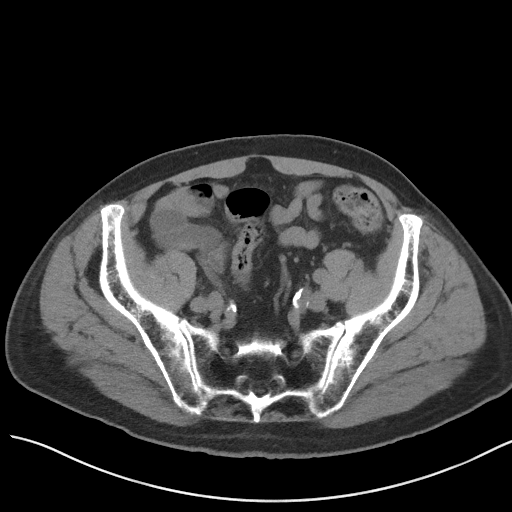
[im 45/98  soft-tissue]
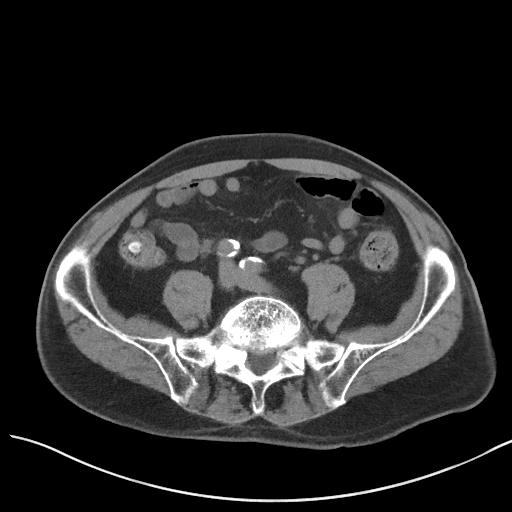
[im 53/98  soft-tissue]
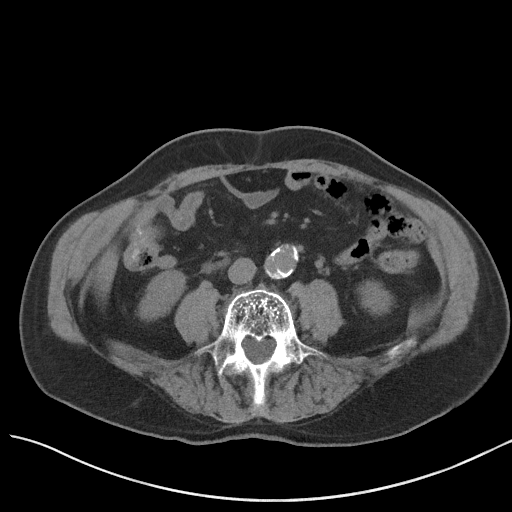
[im 61/98  soft-tissue]
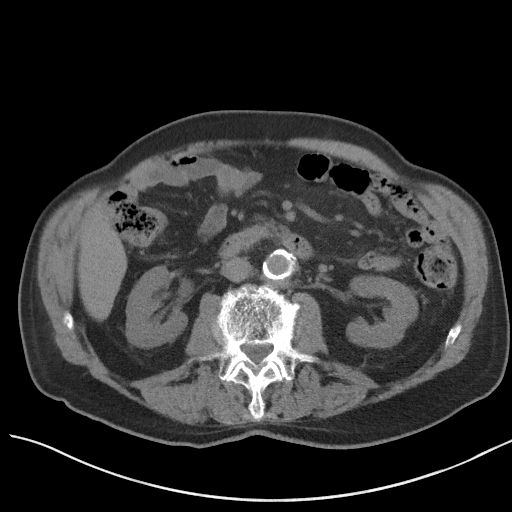
[im 69/98  soft-tissue]
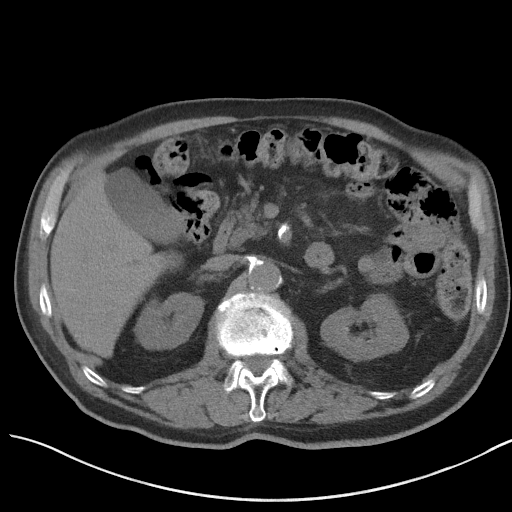
[im 69/98  bone]
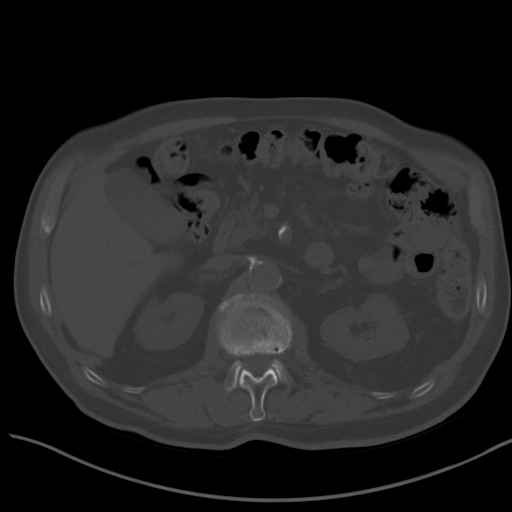
[im 77/98  soft-tissue]
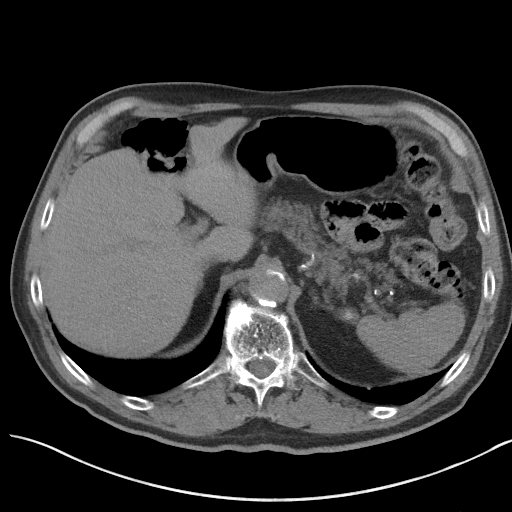
[im 85/98  soft-tissue]
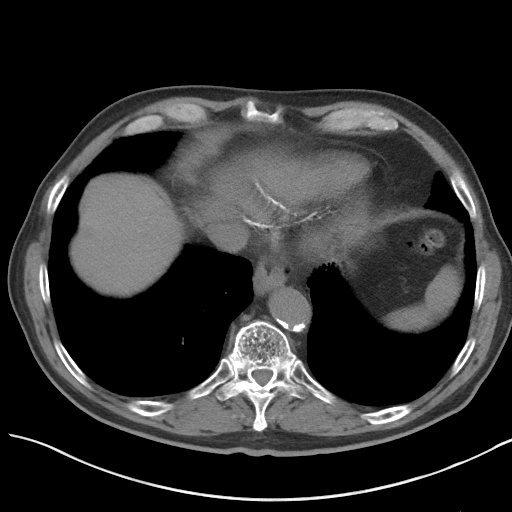
[im 93/98  soft-tissue]
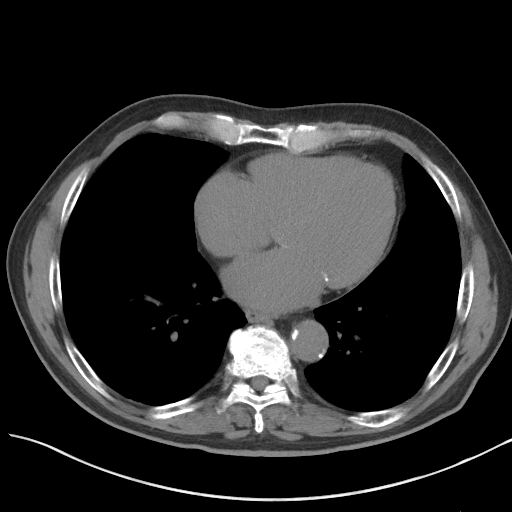

[Series 5: coronal · coronal · 0.79mm/px · 3 of 130 slices shown]
[im 44/130  soft-tissue]
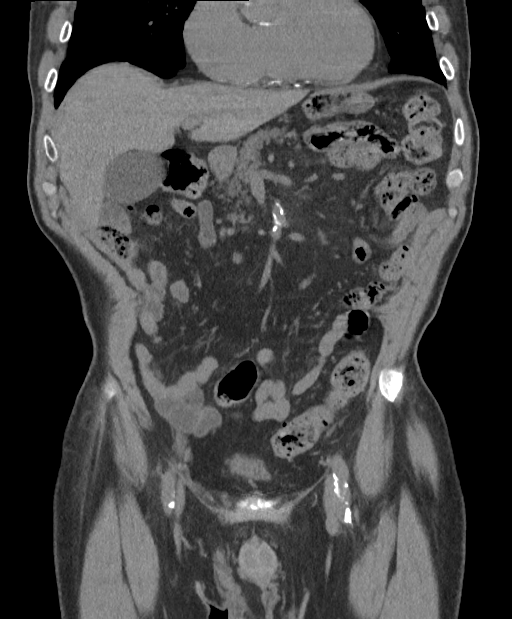
[im 58/130  soft-tissue]
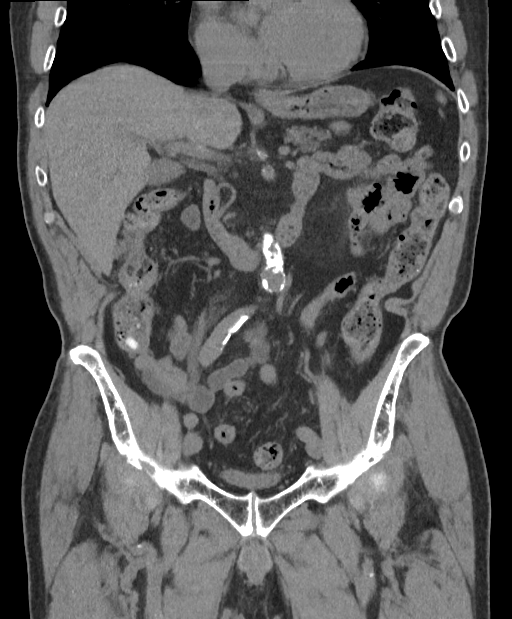
[im 72/130  soft-tissue]
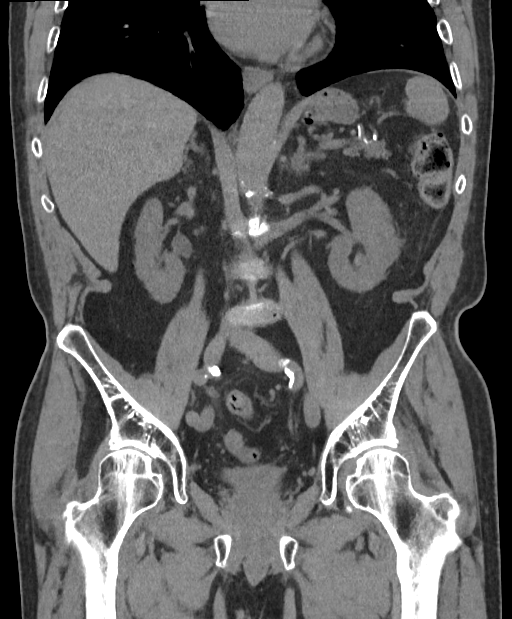

[15 of 46 positions shown; findings below may reference images not displayed]

FINDINGS: Lower chest: Imaging of the lung bases is degraded by respiratory
motion.

Hepatobiliary: Hypodense 7 mm lesion in the right lobe of the liver
is too small to accurately characterize and incompletely evaluated
without intravenous contrast material but statistically likely to
reflect a benign etiology such as a cyst or hemangioma. Gallbladder
is unremarkable. No biliary ductal dilation.

Pancreas: No pancreatic ductal dilation or evidence of acute
inflammation.

Spleen: Normal in size without focal abnormality.

Adrenals/Urinary Tract: Bilateral adrenal glands are within normal
limits.

Mild right hydroureteronephrosis to the level of a 5 mm stone in the
distal ureter proximally 2 cm from the ureterovesicular junction. No
left-sided hydronephrosis or nephrolithiasis. Urinary bladder is
nondistended limiting evaluation.

Stomach/Bowel: Small hiatal hernia otherwise the stomach is
unremarkable for degree of distension. No pathologic dilation of
small or large bowel. The appendix and terminal ileum appear normal.
Colonic diverticulosis without findings of acute diverticulitis.

Vascular/Lymphatic: Aortic atherosclerosis without abdominal aortic
aneurysm. No pathologically enlarged abdominal or pelvic lymph
nodes.

Reproductive: Prostate is unremarkable.

Other: No significant abdominopelvic free fluid.

Musculoskeletal: Diffuse demineralization of bone. Lytic lesion in
the left acetabulum which measures 2.5 cm on image 81/2, with a
narrow zone of transition but which demonstrates an internal soft
tissue component and appears to communicate with the joint space for
instance on image 83/5.
IMPRESSION: 1. Mild right hydroureteronephrosis to the level of a 5 mm stone in
the distal right ureter approximally 2 cm from the ureterovesicular
junction.
2. Lytic lesion in the left acetabulum which measures 2.5 cm, with a
narrow zone of transition but which demonstrates an internal soft
tissue density component and possibly communicates with the joint
space. While this may reflect a benign lesion such as a subchondral
cyst/geode a lytic metastasis is a differential consideration,
consider further evaluation with nonemergent MRI with and without
contrast.
3. Aortic Atherosclerosis ([AC]-[AC]).

## 2021-04-09 MED ORDER — TAMSULOSIN HCL 0.4 MG PO CAPS: 0.4000 mg | ORAL_CAPSULE | Freq: Every day | ORAL | 0 refills | Status: DC

## 2021-04-09 MED ORDER — FENTANYL CITRATE PF 50 MCG/ML IJ SOSY
50.0000 ug | PREFILLED_SYRINGE | Freq: Once | INTRAMUSCULAR | Status: AC
  Administered 2021-04-09: 50 ug via INTRAVENOUS
  Filled 2021-04-09: qty 1

## 2021-04-09 MED ORDER — LISINOPRIL 10 MG PO TABS
40.0000 mg | ORAL_TABLET | Freq: Every day | ORAL | Status: DC
  Administered 2021-04-09: 40 mg via ORAL
  Filled 2021-04-09: qty 4

## 2021-04-09 MED ORDER — OXYCODONE-ACETAMINOPHEN 5-325 MG PO TABS: 1.0000 | ORAL_TABLET | Freq: Three times a day (TID) | ORAL | 0 refills | Status: AC | PRN

## 2021-04-09 MED ORDER — HYDROCODONE-ACETAMINOPHEN 5-325 MG PO TABS: 1.0000 | ORAL_TABLET | Freq: Four times a day (QID) | ORAL | 0 refills | Status: DC | PRN

## 2021-04-09 MED ORDER — OXYCODONE HCL 5 MG PO TABS
5.0000 mg | ORAL_TABLET | ORAL | Status: AC
  Administered 2021-04-09: 5 mg via ORAL
  Filled 2021-04-09: qty 1

## 2021-04-09 MED ORDER — ONDANSETRON HCL 4 MG PO TABS: 4.0000 mg | ORAL_TABLET | Freq: Three times a day (TID) | ORAL | 0 refills | Status: DC | PRN

## 2021-04-09 MED ORDER — ONDANSETRON HCL 4 MG/2ML IJ SOLN
4.0000 mg | Freq: Once | INTRAMUSCULAR | Status: AC
  Administered 2021-04-09: 4 mg via INTRAVENOUS
  Filled 2021-04-09: qty 2

## 2021-04-09 NOTE — ED Provider Notes (Signed)
[facility] Provider Note    Event Date/Time   First MD Initiated Contact with Patient 04/09/21 0732     (approximate)   History   Flank Pain   HPI  TORRY ISTRE is a 86 y.o. male with a past medical history of HTN, HDL, CAD, carotid artery disease, anemia, cytopenia as well as a history of kidney stone who presents for assessment of some right-sided lower back pain rating around the right flank that started this morning.  He states it comes and goes.  He states he also had a little burning with urination this morning but that he felt fine last night.  He denies any blood in his urine or left-sided low back pain or any other associated pain.  No cough, fevers, chest pain, Donnell pain, vomiting, diarrhea, constipation, rash or extremity pain.  Patient denies any history of BPH and states he is not sure if it is similar to pain he does have a kidney stone before.  He states he took 2 Tylenols this morning but did not take his normal blood pressure medicine.  No other acute concerns at this time      Physical Exam  Triage Vital Signs: ED Triage Vitals  Enc Vitals Group     BP 04/09/21 0730 (!) 193/93     Pulse Rate 04/09/21 0730 68     Resp 04/09/21 0732 18     Temp 04/09/21 0732 97.7 F (36.5 C)     Temp Source 04/09/21 0732 Oral     SpO2 04/09/21 0730 100 %     Weight 04/09/21 0731 198 lb (89.8 kg)     Height 04/09/21 0731 6' (1.829 m)     Head Circumference --      Peak Flow --      Pain Score 04/09/21 0731 0     Pain Loc --      Pain Edu? --      Excl. in [place]? --     Most recent vital signs: Vitals:   04/09/21 0900 04/09/21 0930  BP: 100/80 126/86  Pulse: (!) 46 83  Resp: 18 12  Temp:    SpO2: 100% 90%    General: Awake, no distress.  CV:  Good peripheral perfusion.  Resp:  Normal effort.  Abd:  No distention.  Soft throughout. Other:  No significant CVA tenderness.   ED Results / Procedures / Treatments  Labs (all labs ordered  are listed, but only abnormal results are displayed) Labs Reviewed  URINALYSIS, COMPLETE (UACMP) WITH MICROSCOPIC - Abnormal; Notable for the following components:      Result Value   Hgb urine dipstick MODERATE (*)    Ketones, ur TRACE (*)    Protein, ur 30 (*)    All other components within normal limits  COMPREHENSIVE METABOLIC PANEL - Abnormal; Notable for the following components:   Glucose, Bld 119 (*)    All other components within normal limits  CBC WITH DIFFERENTIAL/PLATELET  LIPASE, BLOOD     EKG  EKG is remarkable for sinus rhythm with ventricular rate of 70, normal axis, unremarkable intervals with some artifact and wandering leads in V1 and V2 with isolated nonspecific change in lead III without any other clear evidence of acute ischemia or significant arrhythmia.   RADIOLOGY CT abdomen pelvis interpreted by myself shows appears to be a right-sided hydro utero nephrosis, distal right ureteral stone.  There is no evidence of perinephric stranding, diverticulitis or other clear  acute abdominal or pelvic process.  Also reviewed radiology interpretation and make note of a lytic lesion in the left acetabulum possibly representing a cyst versus metastatic lesion and some aortic atherosclerosis.  No other acute process.    PROCEDURES:  Critical Care performed: No  Procedures    MEDICATIONS ORDERED IN ED: Medications  lisinopril (ZESTRIL) tablet 40 mg (40 mg Oral Given 04/09/21 0827)  fentaNYL (SUBLIMAZE) injection 50 mcg (50 mcg Intravenous Given 04/09/21 0826)  ondansetron (ZOFRAN) injection 4 mg (4 mg Intravenous Given 04/09/21 0857)  oxyCODONE (Oxy IR/ROXICODONE) immediate release tablet 5 mg (5 mg Oral Given 04/09/21 0914)     IMPRESSION / MDM / ASSESSMENT AND PLAN / ED COURSE  I reviewed the triage vital signs and the nursing notes.                              Differential diagnosis includes, but is not limited to kidney stone, pyelonephritis, shingles,  diverticulitis, cholecystitis, pancreatitis, AAA, cystitis, urinary retention and possible atypical presentation for ACS.  CBC shows no leukocytosis or acute anemia.  CMP shows no significant electrolyte or metabolic derangements.  Lipase not consistent with acute pancreatitis.  UA has some hemoglobin and protein but no evidence of infection.  CT abdomen pelvis interpreted by myself shows appears to be a right-sided hydro utero nephrosis, distal right ureteral stone.  There is no evidence of perinephric stranding, diverticulitis or other clear acute abdominal or pelvic process.  Also reviewed radiology interpretation and make note of a lytic lesion in the left acetabulum possibly representing a cyst versus metastatic lesion and some aortic atherosclerosis.  No other acute process.  I suspect this kidney stone is likely etiology of patient's symptoms.  Also discussed incidental finding of cyst versus metastatic lesion and need for nonemergent MRI follow-up to be coordinated by PCP.  On reassessment his pain is well controlled.  Will write Rx for Norco Flomax and Zofran.  Let patient follow-up with urology.  Considered admission for further observation although given stable vitals with no evidence of AKI or infection low suspicion for sepsis without immediate life-threatening process I think patient stable for discharge with outpatient follow-up.  Discharged in stable condition.  Strict return precautions advised and discussed.     FINAL CLINICAL IMPRESSION(S) / ED DIAGNOSES   Final diagnoses:  Kidney stone  Abnormal CT scan  Aortic atherosclerosis ([place])     Rx / DC Orders   ED Discharge Orders          Ordered    HYDROcodone-acetaminophen (NORCO) 5-325 MG tablet  Every 6 hours PRN        04/09/21 0956    ondansetron (ZOFRAN) 4 MG tablet  Every 8 hours PRN        04/09/21 0956    tamsulosin (FLOMAX) 0.4 MG CAPS capsule  Daily        04/09/21 0956             Note:  This document  was prepared using Dragon voice recognition software and may include unintentional dictation errors.   Lucrezia Starch, MD 04/09/21 1000

## 2021-04-09 NOTE — ED Triage Notes (Signed)
Pt via POV from home. Pt c/o R flank pain since this AM states he does have some burning when he pees. Pt also endorses nausea. Denies VD. Pt has a hx of kidney stones in the past but states this does not feel the same. Pt is A&Ox4 and NAD. Pt is HOH

## 2021-04-09 NOTE — Discharge Instructions (Addendum)
Your CT today showed: IMPRESSION: 1. Mild right hydroureteronephrosis to the level of a 5 mm stone in the distal right ureter approximally 2 cm from the ureterovesicular junction. 2. Lytic lesion in the left acetabulum which measures 2.5 cm, with a narrow zone of transition but which demonstrates an internal soft tissue density component and possibly communicates with the joint space. While this may reflect a benign lesion such as a subchondral cyst/geode a lytic metastasis is a differential consideration, consider further evaluation with nonemergent MRI with and without contrast. 3. Aortic Atherosclerosis (ICD10-I70.0).

## 2021-04-09 NOTE — ED Notes (Signed)
Patient transported to CT 

## 2021-04-10 ENCOUNTER — Encounter: Payer: Self-pay | Admitting: Urology

## 2021-04-10 ENCOUNTER — Encounter: Payer: Self-pay | Admitting: Emergency Medicine

## 2021-04-10 ENCOUNTER — Emergency Department
Admission: EM | Admit: 2021-04-10 | Discharge: 2021-04-11 | Disposition: A | Discharge status: Home / Self Care | Payer: Medicare HMO | Attending: Emergency Medicine | Admitting: Emergency Medicine

## 2021-04-10 ENCOUNTER — Telehealth: Payer: Self-pay

## 2021-04-10 ENCOUNTER — Other Ambulatory Visit: Payer: Self-pay | Admitting: Urology

## 2021-04-10 ENCOUNTER — Other Ambulatory Visit: Payer: Self-pay

## 2021-04-10 ENCOUNTER — Ambulatory Visit: Payer: Medicare HMO | Admitting: Urology

## 2021-04-10 ENCOUNTER — Telehealth: Payer: Self-pay | Admitting: Internal Medicine

## 2021-04-10 VITALS — BP 124/47 | HR 86 | Ht 72.0 in | Wt 195.0 lb

## 2021-04-10 DIAGNOSIS — Z7902 Long term (current) use of antithrombotics/antiplatelets: Secondary | ICD-10-CM | POA: Diagnosis not present

## 2021-04-10 DIAGNOSIS — R109 Unspecified abdominal pain: Secondary | ICD-10-CM | POA: Diagnosis present

## 2021-04-10 DIAGNOSIS — Z79899 Other long term (current) drug therapy: Secondary | ICD-10-CM | POA: Insufficient documentation

## 2021-04-10 DIAGNOSIS — N23 Unspecified renal colic: Secondary | ICD-10-CM | POA: Insufficient documentation

## 2021-04-10 DIAGNOSIS — I2510 Atherosclerotic heart disease of native coronary artery without angina pectoris: Secondary | ICD-10-CM | POA: Insufficient documentation

## 2021-04-10 DIAGNOSIS — I10 Essential (primary) hypertension: Secondary | ICD-10-CM | POA: Diagnosis not present

## 2021-04-10 DIAGNOSIS — N201 Calculus of ureter: Secondary | ICD-10-CM | POA: Diagnosis not present

## 2021-04-10 LAB — CBC WITH DIFFERENTIAL/PLATELET
Abs Immature Granulocytes: 0.02 10*3/uL (ref 0.00–0.07)
Basophils Absolute: 0 10*3/uL (ref 0.0–0.1)
Basophils Relative: 0 %
Eosinophils Absolute: 0.1 10*3/uL (ref 0.0–0.5)
Eosinophils Relative: 2 %
HCT: 39 % (ref 39.0–52.0)
Hemoglobin: 12.7 g/dL — ABNORMAL LOW (ref 13.0–17.0)
Immature Granulocytes: 0 %
Lymphocytes Relative: 18 %
Lymphs Abs: 1.5 10*3/uL (ref 0.7–4.0)
MCH: 32 pg (ref 26.0–34.0)
MCHC: 32.6 g/dL (ref 30.0–36.0)
MCV: 98.2 fL (ref 80.0–100.0)
Monocytes Absolute: 0.5 10*3/uL (ref 0.1–1.0)
Monocytes Relative: 6 %
Neutro Abs: 5.9 10*3/uL (ref 1.7–7.7)
Neutrophils Relative %: 74 %
Platelets: 161 10*3/uL (ref 150–400)
RBC: 3.97 MIL/uL — ABNORMAL LOW (ref 4.22–5.81)
RDW: 13.3 % (ref 11.5–15.5)
WBC: 8.1 10*3/uL (ref 4.0–10.5)
nRBC: 0 % (ref 0.0–0.2)

## 2021-04-10 MED ORDER — HYDROMORPHONE HCL 1 MG/ML IJ SOLN
0.5000 mg | Freq: Once | INTRAMUSCULAR | Status: AC
  Administered 2021-04-10: 0.5 mg via INTRAVENOUS
  Filled 2021-04-10: qty 1

## 2021-04-10 MED ORDER — SODIUM CHLORIDE 0.9 % IV BOLUS
1000.0000 mL | Freq: Once | INTRAVENOUS | Status: AC
  Administered 2021-04-10: 1000 mL via INTRAVENOUS

## 2021-04-10 MED ORDER — ONDANSETRON HCL 4 MG/2ML IJ SOLN
4.0000 mg | Freq: Once | INTRAMUSCULAR | Status: AC
  Administered 2021-04-10: 4 mg via INTRAVENOUS
  Filled 2021-04-10: qty 2

## 2021-04-10 MED ORDER — KETOROLAC TROMETHAMINE 30 MG/ML IJ SOLN
15.0000 mg | Freq: Once | INTRAMUSCULAR | Status: AC
  Administered 2021-04-10: 15 mg via INTRAVENOUS
  Filled 2021-04-10: qty 1

## 2021-04-10 NOTE — ED Triage Notes (Signed)
Pt to triage via w/c, appears uncomfortable; seen  yesterday and dx with 69mm rt sided kidney stone; returns for pain unrelieved by oxycodone (took 1 tab and flomax at 730pm

## 2021-04-10 NOTE — H&P (View-Only) (Signed)
04/10/21 9:35 AM   Brett Riley [birth year] [id number]  CC: Right ureteral stone  HPI: 86 year old male with PVD on Plavix who presented to the ER yesterday with severe right-sided flank pain.  CT showed a 7 mm right distal ureteral stone with upstream hydronephrosis, no other renal stones, and no evidence of infection.  He was discharged with medical expulsive therapy.  He reports a prior history of stones over 20 years ago that passed spontaneously.  He denies any fevers or chills.   PMH: Past Medical History:  Diagnosis Date   Carotid arterial disease ([place])    a. 02/2016 L CEA; b. 33/2951 U/S: patent LICA, 8-84% RICA.   Coronary artery disease    a. 01/2016 MV: mild apical/basal inferior, apical lateral, mid anterolateral, and mid inferolateral ischemia. EF 57%; b. 02/2016 Cath: LM 40ost, LAD 70p/m, 20d, D1 95 (small), LCX nl, RCA 34m, RPDA 90 (small), EF 55-65%-->med Rx. Rec CABG for recurrent symptoms.   History of echocardiogram    a. 02/2016 Echo: EF 50-55%, no rwma, mild MR.   Hypercholesterolemia    Hypertension     Surgical History: Past Surgical History:  Procedure Laterality Date   CARDIAC CATHETERIZATION N/A 01/19/2016   Procedure: Left Heart Cath and Coronary Angiography;  Surgeon: Wellington Hampshire, MD;  Location: [facility];  Service: Cardiovascular;  Laterality: N/A;   ENDARTERECTOMY Left 02/15/2016   Procedure: ENDARTERECTOMY CAROTID;  Surgeon: Algernon Huxley, MD;  Location: [facility];  Service: Vascular;  Laterality: Left;     Family History: Family History  Problem Relation Age of Onset   Heart disease Father        MI   Heart attack Father    Stroke Mother    Breast cancer Sister    Colon cancer Neg Hx    Prostate cancer Neg Hx     Social History:  reports that he has never smoked. He has never used smokeless tobacco. He reports that he does not drink alcohol and does not use drugs.  Physical Exam: BP (!) 124/47    Pulse 86    Ht 6'  (1.829 m)    Wt 195 lb (88.5 kg)    BMI 26.45 kg/m    Constitutional:  Alert and oriented, No acute distress. Cardiovascular: No clubbing, cyanosis, or edema. Respiratory: Normal respiratory effort, no increased work of breathing. GI: Abdomen is soft, nontender, nondistended, no abdominal masses   Laboratory Data: Reviewed, see epic  Pertinent Imaging: I have personally viewed and interpreted the CT dated 04/09/2021 that shows a 7 mm right distal ureteral stone with upstream hydronephrosis.  Measured by radiologist at 5 mm, but on my review this is closer to 7 mm.  Assessment & Plan:   86 year old male with right-sided flank pain from a 7 mm right distal ureteral stone, no clinical or laboratory evidence of infection.  We discussed various treatment options for urolithiasis including observation with or without medical expulsive therapy, shockwave lithotripsy (SWL), ureteroscopy and laser lithotripsy with stent placement, and percutaneous nephrolithotomy.  We discussed that management is based on stone size, location, density, patient co-morbidities, and patient preference.   Stones <43mm in size have a >80% spontaneous passage rate. Data surrounding the use of tamsulosin for medical expulsive therapy is controversial, but meta analyses suggests it is most efficacious for distal stones between 5-38mm in size. Possible side effects include dizziness/lightheadedness, and retrograde ejaculation.  SWL has a lower stone free rate in a single procedure,  but also a lower complication rate compared to ureteroscopy and avoids a stent and associated stent related symptoms. Possible complications include renal hematoma, steinstrasse, and need for additional treatment.  Ureteroscopy with laser lithotripsy and stent placement has a higher stone free rate than SWL in a single procedure, however increased complication rate including possible infection, ureteral injury, bleeding, and stent related morbidity.  Common stent related symptoms include dysuria, urgency/frequency, and flank pain.  After an extensive discussion of the risks and benefits of the above treatment options and using shared decision making, the patient would like to proceed with right ureteroscopy, laser lithotripsy, stent placement this week.  Nickolas Madrid, MD 04/10/2021  [facility] [address], [place], [place] [address] [phone]

## 2021-04-10 NOTE — Progress Notes (Signed)
Surgical Physician Order Form [facility]  * Scheduling expectation :  Friday, 04/13/2021  *Length of Case: 1 hour  *Clearance needed: no  *Anticoagulation Instructions: May continue all anticoagulants  *Aspirin Instructions: Ok to continue all  *Post-op visit Date/Instructions:   TBD  *Diagnosis: Right Ureteral Stone  *Procedure: right Ureteroscopy w/laser lithotripsy & stent placement (29562)   Additional orders: N/A  -Admit type: OUTpatient  -Anesthesia: General  -VTE Prophylaxis Standing Order SCDs       Other:   -Standing Lab Orders Per Anesthesia    Lab other: None  -Standing Test orders EKG/Chest x-ray per Anesthesia       Test other:   - Medications:  Ancef 2gm IV  -Other orders:  N/A

## 2021-04-10 NOTE — Progress Notes (Signed)
[facility] Posting Form   Surgery Date/Time: Date: 04/13/2021  Surgeon: Dr. Nickolas Madrid, MD  Surgery Location: Day Surgery  Inpt ( No  )   Outpt (Yes)   Obs ( No  )   Diagnosis: N20.1 Right Ureteral Stone  -CPT: [phone]  Surgery: Right Ureteroscopy with laser lithotripsy and stent placement  Stop Anticoagulations: No, may continue ASA  Cardiac/Medical/Pulmonary Clearance needed: No  *Orders entered into EPIC  Date: 04/10/21   *Case booked in [place]  Date: 04/10/21  *Notified pt of Surgery: Date: 04/10/21  *Placed into Prior Authorization Work Fabio Bering Date: 04/10/21   Assistant/laser/rep:No

## 2021-04-10 NOTE — Telephone Encounter (Signed)
Please review CT from ED.

## 2021-04-10 NOTE — Telephone Encounter (Signed)
Pt wife called in regards to pt who was seen at ER yesterday and they told pt to follow-up with abnormal ct finding for a cyst

## 2021-04-10 NOTE — Patient Instructions (Signed)
Laser Therapy for Kidney Stones Laser therapy for kidney stones is a procedure to break up small, hard mineral deposits that form in the kidney (kidney stones). The procedure is done using a device that produces a focused beam of light (laser). The laser breaks up kidney stones into pieces that are small enough to be passed out of the body through urination or removed from the body during the procedure. You may need laser therapy if you have kidney stones that are painful or block your urinary tract. This procedure is done by inserting a tube (ureteroscope) into your kidney through the urethral opening. The urethra is the part of the body that drains urine from the bladder. In women, the urethra opens above the vaginal opening. In men, the urethra opens at the tip of the penis. The ureteroscope is inserted through the urethra, and surgical instruments are moved through the bladder and the muscular tube that connects the kidney to the bladder (ureter) until they reach the kidney. Tell a health care provider about: Any allergies you have. All medicines you are taking, including vitamins, herbs, eye drops, creams, and over-the-counter medicines. Any problems you or family members have had with anesthetic medicines. Any blood disorders you have. Any surgeries you have had. Any medical conditions you have. Whether you are pregnant or may be pregnant. What are the risks? Generally, this is a safe procedure. However, problems may occur, including: Infection. Bleeding. Allergic reactions to medicines. Damage to the urethra, bladder, or ureter. Urinary tract infection (UTI). Narrowing of the urethra (urethral stricture). Difficulty passing urine. Blockage of the kidney caused by a fragment of kidney stone. What happens before the procedure? Medicines Ask your health care provider about: Changing or stopping your regular medicines. This is especially important if you are taking diabetes medicines or  blood thinners. Taking medicines such as aspirin and ibuprofen. These medicines can thin your blood. Do not take these medicines unless your health care provider tells you to take them. Taking over-the-counter medicines, vitamins, herbs, and supplements. Eating and drinking Follow instructions from your health care provider about eating and drinking, which may include: 8 hours before the procedure - stop eating heavy meals or foods, such as meat, fried foods, or fatty foods. 6 hours before the procedure - stop eating light meals or foods, such as toast or cereal. 6 hours before the procedure - stop drinking milk or drinks that contain milk. 2 hours before the procedure - stop drinking clear liquids. Staying hydrated Follow instructions from your health care provider about hydration, which may include: Up to 2 hours before the procedure - you may continue to drink clear liquids, such as water, clear fruit juice, black coffee, and plain tea.  General instructions You may have a physical exam before the procedure. You may also have tests, such as imaging tests and blood or urine tests. If your ureter is too narrow, your health care provider may place a soft, flexible tube (stent) inside of it. The stent may be placed days or weeks before your laser therapy procedure. Plan to have someone take you home from the hospital or clinic. If you will be going home right after the procedure, plan to have someone stay with you for 24 hours. Do not use any products that contain nicotine or tobacco for at least 4 weeks before the procedure. These products include cigarettes, e-cigarettes, and chewing tobacco. If you need help quitting, ask your health care provider. Ask your health care provider: How your  surgical site will be marked or identified. What steps will be taken to help prevent infection. These may include: Removing hair at the surgery site. Washing skin with a germ-killing soap. Taking antibiotic  medicine. What happens during the procedure?  An IV will be inserted into one of your veins. You will be given one or more of the following: A medicine to help you relax (sedative). A medicine to numb the area (local anesthetic). A medicine to make you fall asleep (general anesthetic). A ureteroscope will be inserted into your urethra. The ureteroscope will send images to a video screen in the operating room to guide your surgeon to the area of your kidney that will be treated. A small, flexible tube will be threaded through the ureteroscope and into your bladder and ureter, up to your kidney. The laser device will be inserted into your kidney through the tube. Your surgeon will pulse the laser on and off to break up kidney stones. A surgical instrument that has a tiny wire basket may be inserted through the tube into your kidney to remove the pieces of broken kidney stone. The procedure may vary among health care providers and hospitals. What happens after the procedure? Your blood pressure, heart rate, breathing rate, and blood oxygen level will be monitored until you leave the hospital or clinic. You will be given pain medicine as needed. You may continue to receive antibiotics. You may have a stent temporarily placed in your ureter. Do not drive for 24 hours if you were given a sedative during your procedure. You may be given a strainer to collect any stone fragments that you pass in your urine. Your health care provider may have these tested. Summary Laser therapy for kidney stones is a procedure to break up kidney stones into pieces that are small enough to be passed out of the body through urination or removed during the procedure. Follow instructions from your health care provider about eating and drinking before the procedure. During the procedure, the ureteroscope will send images to a video screen to guide your surgeon to the area of your kidney that will be treated. Do not drive  for 24 hours if you were given a sedative during your procedure. This information is not intended to replace advice given to you by your health care provider. Make sure you discuss any questions you have with your health care provider. Document Revised: 10/23/2020 Document Reviewed: 10/23/2020 Elsevier Patient Education  2022 [employer].  Ureteral Stent Implantation Ureteral stent implantation is a procedure to insert (implant) a flexible, soft, plastic tube (stent) into a ureter. Ureters are the tube-like parts of the body that drain urine from the kidneys. The stent supports the ureter while it heals and helps to drain urine. You may have a ureteral stent implanted after having a procedure to remove a blockage from the ureter (ureterolysis or pyeloplasty). You may also have a stent implanted to open the flow of urine when you have a blockage caused by a kidney stone, tumor, blood clot, or infection. You have two ureters, one on each side of the body. The ureters connect the kidneys to the organ that holds urine until it passes out of the body (bladder). The stent is placed so that one end is in the kidney, and one end is in the bladder. The stent is usually taken out after your ureter has healed. Depending on your condition, you may have a stent for just a few weeks, or you may have  a long-term stent that will need to be replaced every few months. Tell a health care provider about: Any allergies you have. All medicines you are taking, including vitamins, herbs, eye drops, creams, and over-the-counter medicines. Any problems you or family members have had with anesthetic medicines. Any blood disorders you have. Any surgeries you have had. Any medical conditions you have. Whether you are pregnant or may be pregnant. What are the risks? Generally, this is a safe procedure. However, problems may occur, including: Infection. Bleeding. Allergic reactions to medicines. Damage to other structures  or organs. Tearing (perforation) of the ureter is possible. Movement of the stent away from where it is placed during surgery (migration). What happens before the procedure? Medicines Ask your health care provider about: Changing or stopping your regular medicines. This is especially important if you are taking diabetes medicines or blood thinners. Taking medicines such as aspirin and ibuprofen. These medicines can thin your blood. Do not take these medicines unless your health care provider tells you to take them. Taking over-the-counter medicines, vitamins, herbs, and supplements. Eating and drinking Follow instructions from your health care provider about eating and drinking, which may include: 8 hours before the procedure - stop eating heavy meals or foods, such as meat, fried foods, or fatty foods. 6 hours before the procedure - stop eating light meals or foods, such as toast or cereal. 6 hours before the procedure - stop drinking milk or drinks that contain milk. 2 hours before the procedure - stop drinking clear liquids. Staying hydrated Follow instructions from your health care provider about hydration, which may include: Up to 2 hours before the procedure - you may continue to drink clear liquids, such as water, clear fruit juice, black coffee, and plain tea. General instructions Do not drink alcohol. Do not use any products that contain nicotine or tobacco for at least 4 weeks before the procedure. These products include cigarettes, e-cigarettes, and chewing tobacco. If you need help quitting, ask your health care provider. You may have an exam or testing, such as imaging or blood tests. Ask your health care provider what steps will be taken to help prevent infection. These may include: Removing hair at the surgery site. Washing skin with a germ-killing soap. Taking antibiotic medicine. Plan to have someone take you home from the hospital or clinic. If you will be going home right  after the procedure, plan to have someone with you for 24 hours. What happens during the procedure? An IV will be inserted into one of your veins. You may be given a medicine to help you relax (sedative). You may be given a medicine to make you fall asleep (general anesthetic). A thin, tube-shaped instrument with a light and tiny camera at the end (cystoscope) will be inserted into your urethra. The urethra is the tube that drains urine from the bladder out of the body. In men, the urethra opens at the end of the penis. In women, the urethra opens in front of the vaginal opening. The cystoscope will be passed into your bladder. A thin wire (guide wire) will be passed through your bladder and into your ureter. This is used to guide the stent into your ureter. The stent will be inserted into your ureter. The guide wire and the cystoscope will be removed. A flexible tube (catheter) may be inserted through your urethra so that one end is in your bladder. This helps to drain urine from your bladder. The procedure may vary among hospitals and  health care providers. What happens after the procedure? Your blood pressure, heart rate, breathing rate, and blood oxygen level will be monitored until you leave the hospital or clinic. You may continue to receive medicine and fluids through an IV. You may have some soreness or pain in your abdomen and urethra. Medicines will be available to help you. You will be encouraged to get up and walk around as soon as you can. You may have a catheter draining your urine. You will have some blood in your urine. Do not drive for 24 hours if you were given a sedative during your procedure. Summary Ureteral stent implantation is a procedure to insert a flexible, soft, plastic tube (stent) into a ureter. You may have a stent implanted to support the ureter while it heals after a procedure or to open the flow of urine if there is a blockage. Follow instructions from your  health care provider about taking medicines and about eating and drinking before the procedure. Depending on your condition, you may have a stent for just a few weeks, or you may have a long-term stent that will need to be replaced every few months. This information is not intended to replace advice given to you by your health care provider. Make sure you discuss any questions you have with your health care provider. Document Revised: 11/25/2017 Document Reviewed: 11/26/2017 Elsevier Patient Education  2022 [employer].

## 2021-04-10 NOTE — ED Provider Notes (Signed)
[facility] Provider Note    Event Date/Time   First MD Initiated Contact with Patient 04/10/21 2311     (approximate)   History   Flank Pain   HPI  Brett Riley is a 86 y.o. male who presents to the ED from home with a chief complaint of pain from kidney stone.  Patient was seen in the ED yesterday with right flank pain, found to have a 5 mm right-sided kidney stone.  States oxycodone has been managing his pain well every 8 hours until 7:30 PM.  Endorses associated nausea.  Denies fever, chills, cough, chest pain, shortness of breath, vomiting, hematuria, testicular pain or swelling.     Past Medical History   Past Medical History:  Diagnosis Date   Carotid arterial disease ([place])    a. 02/2016 L CEA; b. 54/0086 U/S: patent LICA, 7-61% RICA.   Coronary artery disease    a. 01/2016 MV: mild apical/basal inferior, apical lateral, mid anterolateral, and mid inferolateral ischemia. EF 57%; b. 02/2016 Cath: LM 40ost, LAD 70p/m, 20d, D1 95 (small), LCX nl, RCA 97m, RPDA 90 (small), EF 55-65%-->med Rx. Rec CABG for recurrent symptoms.   History of echocardiogram    a. 02/2016 Echo: EF 50-55%, no rwma, mild MR.   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   Dizziness 08/04/2016   Abdominal bruit 08/04/2016   Carotid stenosis, asymptomatic, left 02/15/2016   CAD (coronary artery disease)    Health care maintenance 09/11/2014   Hip region mass 12/12/2013   Soft tissue mass 11/28/2013   Fatigue 07/28/2013   Environmental allergies 07/28/2013   Carotid stenosis 01/21/2013   Thrombocytopenia ([place]) 07/26/2012   Anemia 07/26/2012   Essential  hypertension, benign 05/24/2012   Hyperlipidemia 05/24/2012     Past Surgical History   Past Surgical History:  Procedure Laterality Date   CARDIAC CATHETERIZATION N/A 01/19/2016   Procedure: Left Heart Cath and Coronary Angiography;  Surgeon: Wellington Hampshire, MD;  Location: [facility];  Service: Cardiovascular;  Laterality: N/A;   ENDARTERECTOMY Left 02/15/2016   Procedure: ENDARTERECTOMY CAROTID;  Surgeon: Algernon Huxley, MD;  Location: [facility];  Service: Vascular;  Laterality: Left;     Home Medications   Prior to Admission medications   Medication Sig Start Date End Date Taking? Authorizing Provider  ondansetron (ZOFRAN-ODT) 4 MG disintegrating tablet Take 1 tablet (4 mg total) by mouth every 8 (eight) hours as needed for nausea or vomiting. 04/11/21  Yes Paulette Blanch, MD  oxyCODONE-acetaminophen (PERCOCET/ROXICET) 5-325 MG tablet Take 1 tablet by mouth every 4 (four) hours as needed for severe pain. 04/11/21  Yes Paulette Blanch, MD  tamsulosin (FLOMAX) 0.4 MG CAPS capsule Take 1 capsule (0.4 mg total) by mouth daily. 04/11/21  Yes Paulette Blanch, MD  aspirin 81 MG tablet Take 81 mg by mouth at bedtime.     [provider]  clopidogrel (PLAVIX) 75 MG tablet TAKE 1 TABLET BY MOUTH DAILY WITH BREAKFAST. 02/06/21   Einar Pheasant, MD  lisinopril (ZESTRIL) 40 MG tablet TAKE 1 TABLET BY MOUTH EVERY DAY 02/27/21   Einar Pheasant,  MD  metoprolol succinate (TOPROL-XL) 100 MG 24 hr tablet TAKE 1 TABLET BY MOUTH EVERY DAY WITH A MEAL 02/27/21   Einar Pheasant, MD  Multiple Vitamins-Iron (MULTIVITAMINS WITH IRON) TABS Take 1 tablet by mouth daily.    [provider]  ondansetron (ZOFRAN) 4 MG tablet Take 1 tablet (4 mg total) by mouth every 8 (eight) hours as needed for up to 10 doses for nausea or vomiting. 04/09/21   Lucrezia Starch, MD  oxyCODONE-acetaminophen (PERCOCET) 5-325 MG tablet Take 1 tablet by mouth every 8 (eight) hours as needed for up to 5 days for severe pain.  04/09/21 04/14/21  Lucrezia Starch, MD  rosuvastatin (CRESTOR) 10 MG tablet TAKE 1 TABLET BY MOUTH EVERY DAY 06/26/20   Loel Dubonnet, NP  tamsulosin (FLOMAX) 0.4 MG CAPS capsule Take 1 capsule (0.4 mg total) by mouth daily for 5 days. 04/09/21 04/14/21  Lucrezia Starch, MD  vitamin B-12 (CYANOCOBALAMIN) 1000 MCG tablet Take 1,000 mcg by mouth daily.    [provider]     Allergies  Patient has no known allergies.   Family History   Family History  Problem Relation Age of Onset   Heart disease Father        MI   Heart attack Father    Stroke Mother    Breast cancer Sister    Colon cancer Neg Hx    Prostate cancer Neg Hx      Physical Exam  Triage Vital Signs: ED Triage Vitals  Enc Vitals Group     BP 04/10/21 2252 (!) 179/84     Pulse Rate 04/10/21 2252 73     Resp 04/10/21 2252 20     Temp 04/10/21 2252 99.4 F (37.4 C)     Temp Source 04/10/21 2252 Oral     SpO2 04/10/21 2252 95 %     Weight 04/10/21 2251 195 lb 1.7 oz (88.5 kg)     Height 04/10/21 2251 6' (1.829 m)     Head Circumference --      Peak Flow --      Pain Score 04/10/21 2251 10     Pain Loc --      Pain Edu? --      Excl. in [place]? --     Updated Vital Signs: BP (!) 179/84 (BP Location: Right Arm)    Pulse 73    Temp 99.4 F (37.4 C) (Oral)    Resp 20    Ht 6' (1.829 m)    Wt 88.5 kg    SpO2 95%    BMI 26.46 kg/m    General: Awake, mild to moderate distress.  CV:  RRR.  Good peripheral perfusion.  Resp:  Normal effort.  CTAB. Abd:  Mild right CVAT and lateral side tenderness.  No distention.  Other:  No vesicles.   ED Results / Procedures / Treatments  Labs (all labs ordered are listed, but only abnormal results are displayed) Labs Reviewed  CBC WITH DIFFERENTIAL/PLATELET - Abnormal; Notable for the following components:      Result Value   RBC 3.97 (*)    Hemoglobin 12.7 (*)    All other components within normal limits  BASIC METABOLIC PANEL - Abnormal; Notable for the  following components:   Glucose, Bld 130 (*)    BUN 27 (*)    Creatinine, Ser 1.40 (*)    GFR, Estimated 50 (*)    All other components within normal limits  URINALYSIS,  ROUTINE W REFLEX MICROSCOPIC - Abnormal; Notable for the following components:   Color, Urine YELLOW (*)    APPearance CLEAR (*)    Hgb urine dipstick MODERATE (*)    All other components within normal limits     EKG  None   RADIOLOGY None   Official radiology report(s): No results found.   PROCEDURES:  Critical Care performed: No  .1-3 Lead EKG Interpretation Performed by: Paulette Blanch, MD Authorized by: Paulette Blanch, MD     Interpretation: normal     ECG rate:  75   ECG rate assessment: normal     Rhythm: sinus rhythm     Ectopy: none     Conduction: normal   Comments:     Patient placed on cardiac monitor to evaluate for arrhythmias   MEDICATIONS ORDERED IN ED: Medications  sodium chloride 0.9 % bolus 1,000 mL (0 mLs Intravenous Stopped 04/11/21 0027)  ondansetron (ZOFRAN) injection 4 mg (4 mg Intravenous Given 04/10/21 2344)  HYDROmorphone (DILAUDID) injection 0.5 mg (0.5 mg Intravenous Given 04/10/21 2344)  ketorolac (TORADOL) 30 MG/ML injection 15 mg (15 mg Intravenous Given 04/10/21 2344)     IMPRESSION / MDM / [place] / ED COURSE  I reviewed the triage vital signs and the nursing notes.                             86 year old male who returns for pain control for ureteral colic. Differential diagnosis includes, but is not limited to, acute appendicitis, renal colic, testicular torsion, urinary tract infection/pyelonephritis, prostatitis,  epididymitis, diverticulitis, small bowel obstruction or ileus, colitis, abdominal aortic aneurysm, gastroenteritis, hernia, etc. I have personally reviewed patient's ED chart yesterday as well as the CT scan results.  The patient is on the cardiac monitor to evaluate for evidence of arrhythmia and/or significant heart rate changes.  Will  repeat basic lab work, UA to ensure no evidence of developing infection.  Initiate IV fluid hydration, IV Dilaudid for pain, IV Toradol, IV Zofran for nausea.  Will reassess.  Clinical Course as of 04/11/21 0109  Wed Apr 11, 2021  0104 Pain resolved, patient smiling.  Updated patient and family members of all test results.  Normal WBC 8.1, mild AKI BUN 27/creatinine 1.4 compared to prior.  UA without evidence of infection.  Discussed with patient and his family that he may take Percocet 1-2 every 4-6 hours as needed for pain.  He will continue Flomax and Zofran as needed.  He will keep his urology appointment on Friday.  Strict return precautions given.  Patient and family members verbalized understanding and agree with plan of care.  Patient was written for only a 5-day course of Flomax, will amend that to a total of 14 days.  He was written for a 3-day course of Percocet and Zofran; will extend those prescriptions. [JS]    Clinical Course User Index [JS] Paulette Blanch, MD     FINAL CLINICAL IMPRESSION(S) / ED DIAGNOSES   Final diagnoses:  Ureteral colic     Rx / DC Orders   ED Discharge Orders          Ordered    oxyCODONE-acetaminophen (PERCOCET/ROXICET) 5-325 MG tablet  Every 4 hours PRN        04/11/21 0107    ondansetron (ZOFRAN-ODT) 4 MG disintegrating tablet  Every 8 hours PRN        04/11/21 0107  tamsulosin (FLOMAX) 0.4 MG CAPS capsule  Daily        04/11/21 0107             Note:  This document was prepared using Dragon voice recognition software and may include unintentional dictation errors.   Paulette Blanch, MD 04/11/21 [phone]

## 2021-04-10 NOTE — Telephone Encounter (Signed)
I spoke with [name] and his Wife. We have discussed possible surgery dates and Friday February 10th, 2023 was agreed upon by all parties. Patient given information about surgery date, what to expect pre-operatively and post operatively.  We discussed that a Pre-Admission Testing office will be calling to set up the pre-op visit that will take place prior to surgery, and that these appointments are typically done over the phone with a Pre-Admissions RN.  Informed patient that our office will communicate any additional care to be provided after surgery. Patients questions or concerns were discussed during our call. Advised to call our office should there be any additional information, questions or concerns that arise. Patient verbalized understanding.

## 2021-04-10 NOTE — Progress Notes (Signed)
04/10/21 9:35 AM   Brett Riley [birth year] [id number]  CC: Right ureteral stone  HPI: 86 year old male with PVD on Plavix who presented to the ER yesterday with severe right-sided flank pain.  CT showed a 7 mm right distal ureteral stone with upstream hydronephrosis, no other renal stones, and no evidence of infection.  He was discharged with medical expulsive therapy.  He reports a prior history of stones over 20 years ago that passed spontaneously.  He denies any fevers or chills.   PMH: Past Medical History:  Diagnosis Date   Carotid arterial disease ([place])    a. 02/2016 L CEA; b. 10/6576 U/S: patent LICA, 4-69% RICA.   Coronary artery disease    a. 01/2016 MV: mild apical/basal inferior, apical lateral, mid anterolateral, and mid inferolateral ischemia. EF 57%; b. 02/2016 Cath: LM 40ost, LAD 70p/m, 20d, D1 95 (small), LCX nl, RCA 36m, RPDA 90 (small), EF 55-65%-->med Rx. Rec CABG for recurrent symptoms.   History of echocardiogram    a. 02/2016 Echo: EF 50-55%, no rwma, mild MR.   Hypercholesterolemia    Hypertension     Surgical History: Past Surgical History:  Procedure Laterality Date   CARDIAC CATHETERIZATION N/A 01/19/2016   Procedure: Left Heart Cath and Coronary Angiography;  Surgeon: Wellington Hampshire, MD;  Location: [facility];  Service: Cardiovascular;  Laterality: N/A;   ENDARTERECTOMY Left 02/15/2016   Procedure: ENDARTERECTOMY CAROTID;  Surgeon: Algernon Huxley, MD;  Location: [facility];  Service: Vascular;  Laterality: Left;     Family History: Family History  Problem Relation Age of Onset   Heart disease Father        MI   Heart attack Father    Stroke Mother    Breast cancer Sister    Colon cancer Neg Hx    Prostate cancer Neg Hx     Social History:  reports that he has never smoked. He has never used smokeless tobacco. He reports that he does not drink alcohol and does not use drugs.  Physical Exam: BP (!) 124/47    Pulse 86    Ht 6'  (1.829 m)    Wt 195 lb (88.5 kg)    BMI 26.45 kg/m    Constitutional:  Alert and oriented, No acute distress. Cardiovascular: No clubbing, cyanosis, or edema. Respiratory: Normal respiratory effort, no increased work of breathing. GI: Abdomen is soft, nontender, nondistended, no abdominal masses   Laboratory Data: Reviewed, see epic  Pertinent Imaging: I have personally viewed and interpreted the CT dated 04/09/2021 that shows a 7 mm right distal ureteral stone with upstream hydronephrosis.  Measured by radiologist at 5 mm, but on my review this is closer to 7 mm.  Assessment & Plan:   86 year old male with right-sided flank pain from a 7 mm right distal ureteral stone, no clinical or laboratory evidence of infection.  We discussed various treatment options for urolithiasis including observation with or without medical expulsive therapy, shockwave lithotripsy (SWL), ureteroscopy and laser lithotripsy with stent placement, and percutaneous nephrolithotomy.  We discussed that management is based on stone size, location, density, patient co-morbidities, and patient preference.   Stones <18mm in size have a >80% spontaneous passage rate. Data surrounding the use of tamsulosin for medical expulsive therapy is controversial, but meta analyses suggests it is most efficacious for distal stones between 5-19mm in size. Possible side effects include dizziness/lightheadedness, and retrograde ejaculation.  SWL has a lower stone free rate in a single procedure,  but also a lower complication rate compared to ureteroscopy and avoids a stent and associated stent related symptoms. Possible complications include renal hematoma, steinstrasse, and need for additional treatment.  Ureteroscopy with laser lithotripsy and stent placement has a higher stone free rate than SWL in a single procedure, however increased complication rate including possible infection, ureteral injury, bleeding, and stent related morbidity.  Common stent related symptoms include dysuria, urgency/frequency, and flank pain.  After an extensive discussion of the risks and benefits of the above treatment options and using shared decision making, the patient would like to proceed with right ureteroscopy, laser lithotripsy, stent placement this week.  Nickolas Madrid, MD 04/10/2021  [facility] [address] [place], [place] [address] [phone]

## 2021-04-11 LAB — URINALYSIS, ROUTINE W REFLEX MICROSCOPIC
Bacteria, UA: NONE SEEN
Bilirubin Urine: NEGATIVE
Glucose, UA: NEGATIVE mg/dL
Ketones, ur: NEGATIVE mg/dL
Leukocytes,Ua: NEGATIVE
Nitrite: NEGATIVE
Protein, ur: NEGATIVE mg/dL
Specific Gravity, Urine: 1.025 (ref 1.005–1.030)
Squamous Epithelial / HPF: NONE SEEN (ref 0–5)
pH: 5 (ref 5.0–8.0)

## 2021-04-11 LAB — BASIC METABOLIC PANEL WITH GFR
Anion gap: 6 (ref 5–15)
BUN: 27 mg/dL — ABNORMAL HIGH (ref 8–23)
CO2: 26 mmol/L (ref 22–32)
Calcium: 9.5 mg/dL (ref 8.9–10.3)
Chloride: 104 mmol/L (ref 98–111)
Creatinine, Ser: 1.4 mg/dL — ABNORMAL HIGH (ref 0.61–1.24)
GFR, Estimated: 50 mL/min — ABNORMAL LOW
Glucose, Bld: 130 mg/dL — ABNORMAL HIGH (ref 70–99)
Potassium: 4.2 mmol/L (ref 3.5–5.1)
Sodium: 136 mmol/L (ref 135–145)

## 2021-04-11 MED ORDER — OXYCODONE-ACETAMINOPHEN 5-325 MG PO TABS: 1.0000 | ORAL_TABLET | ORAL | 0 refills | Status: DC | PRN

## 2021-04-11 MED ORDER — TAMSULOSIN HCL 0.4 MG PO CAPS: 0.4000 mg | ORAL_CAPSULE | Freq: Every day | ORAL | 0 refills | Status: DC

## 2021-04-11 MED ORDER — ONDANSETRON 4 MG PO TBDP: 4.0000 mg | ORAL_TABLET | Freq: Three times a day (TID) | ORAL | 0 refills | Status: DC | PRN

## 2021-04-11 NOTE — ED Notes (Signed)
No pain or nausea at this time. Voided into urinal. Urine sample sent to lab. Pt requested a drink. Ginger Ale provided to patient. Family present in room. A&Ox4. Skin p/w/d. RR even and nonlabored. Resting on bed comfortably.

## 2021-04-11 NOTE — Telephone Encounter (Signed)
Notify - I reviewed his CT scan.  There is a questionable area in the hip that showed up on his CT scan.  He is currently in the middle of the w/up for his kidney stone.  Once this acute issue is resolved, we will plan to schedule a f/u scan (MRI) to further evaluate the hip.  Let me know if any other questions.  Make sure has f/u appt scheduled with me in the next few weeks.

## 2021-04-11 NOTE — Telephone Encounter (Signed)
Aware of below and scheduled for appt 3/9

## 2021-04-11 NOTE — Discharge Instructions (Signed)
1. Take pain & nausea medicines as needed (Percocet/Zofran #30). Make sure to take a stool softener while taking narcotic pain medicines. 2. Take Flomax 0.4mg  daily x 14 days total. 3. Drink plenty of bottled or filtered water daily. 4. Return to the ER for worsening symptoms, persistent vomiting, fever, difficulty breathing or other concerns.

## 2021-04-12 ENCOUNTER — Other Ambulatory Visit: Payer: Self-pay

## 2021-04-12 ENCOUNTER — Encounter
Admission: RE | Admit: 2021-04-12 | Discharge: 2021-04-12 | Disposition: A | Discharge status: Home / Self Care | Payer: Medicare HMO | Source: Ambulatory Visit | Attending: Urology | Admitting: Urology

## 2021-04-12 HISTORY — DX: Personal history of urinary calculi: Z87.442

## 2021-04-12 HISTORY — DX: Malignant (primary) neoplasm, unspecified: C80.1

## 2021-04-12 NOTE — Patient Instructions (Addendum)
Your procedure is scheduled on:04-13-21 Friday Report to the Registration Desk on the 1st floor of the [facility].Then proceed to the 2nd floor Surgery Desk in the [facility] To find out your arrival time, please call [phone] between 1PM - 3PM on:04-12-21 Thursday  REMEMBER: Instructions that are not followed completely may result in serious medical risk, up to and including death; or upon the discretion of your surgeon and anesthesiologist your surgery may need to be rescheduled.  Do not eat food after midnight the night before surgery.  No gum chewing, lozengers or hard candies.  TAKE THESE MEDICATIONS THE MORNING OF SURGERY WITH A SIP OF WATER: -metoprolol succinate (TOPROL-XL)  -You may take oxyCODONE-acetaminophen (PERCOCET) 5-325 MG tablet/ondansetron (ZOFRAN) 4 MG tablet the morning of surgery for pain and nausea/vomiting  Continue clopidogrel (PLAVIX) and aspirin 81 MG tablet up until the day prior to surgery-Do NOT take the morning of surgery  One week prior to surgery: Stop Anti-inflammatories (NSAIDS) such as Advil, Aleve, Ibuprofen, Motrin, Naproxen, Naprosyn and Aspirin based products such as Excedrin, Goodys Powder, BC Powder. You may however, continue to take Tylenol/Percocet if needed for pain up until the day of surgery.  No Alcohol for 24 hours before or after surgery.  No Smoking including e-cigarettes for 24 hours prior to surgery.  No chewable tobacco products for at least 6 hours prior to surgery.  No nicotine patches on the day of surgery.  Do not use any "recreational" drugs for at least a week prior to your surgery.  Please be advised that the combination of cocaine and anesthesia may have negative outcomes, up to and including death. If you test positive for cocaine, your surgery will be cancelled.  On the morning of surgery brush your teeth with toothpaste and water, you may rinse your mouth with mouthwash if you wish. Do not swallow any toothpaste or  mouthwash.  Do not wear jewelry, make-up, hairpins, clips or nail polish.  Do not wear lotions, powders, or perfumes.   Do not shave body from the neck down 48 hours prior to surgery just in case you cut yourself which could leave a site for infection.  Also, freshly shaved skin may become irritated if using the CHG soap.  Contact lenses, hearing aids and dentures may not be worn into surgery.  Do not bring valuables to the hospital. [facility] is not responsible for any missing/lost belongings or valuables.  Notify your doctor if there is any change in your medical condition (cold, fever, infection).  Wear comfortable clothing (specific to your surgery type) to the hospital.  After surgery, you can help prevent lung complications by doing breathing exercises.  Take deep breaths and cough every 1-2 hours. Your doctor may order a device called an Incentive Spirometer to help you take deep breaths. When coughing or sneezing, hold a pillow firmly against your incision with both hands. This is called splinting. Doing this helps protect your incision. It also decreases belly discomfort.  If you are being admitted to the hospital overnight, leave your suitcase in the car. After surgery it may be brought to your room.  If you are being discharged the day of surgery, you will not be allowed to drive home. You will need a responsible adult (18 years or older) to drive you home and stay with you that night.   If you are taking public transportation, you will need to have a responsible adult (18 years or older) with you. Please confirm with  your physician that it is acceptable to use public transportation.   Please call the [facility] Dept. at [phone] if you have any questions about these instructions.  Surgery Visitation Policy:  Patients undergoing a surgery or procedure may have one family member or support person with them as long as that person is not COVID-19  positive or experiencing its symptoms.  That person may remain in the waiting area during the procedure and may rotate out with other people.  Inpatient Visitation:    Visiting hours are 7 a.m. to 8 p.m. Up to two visitors ages 16+ are allowed at one time in a patient room. The visitors may rotate out with other people during the day. Visitors must check out when they leave, or other visitors will not be allowed. One designated support person may remain overnight. The visitor must pass COVID-19 screenings, use hand sanitizer when entering and exiting the patients room and wear a mask at all times, including in the patients room. Patients must also wear a mask when staff or their visitor are in the room. Masking is required regardless of vaccination status.

## 2021-04-13 ENCOUNTER — Other Ambulatory Visit: Payer: Self-pay

## 2021-04-13 ENCOUNTER — Ambulatory Visit: Payer: Medicare HMO | Admitting: Anesthesiology

## 2021-04-13 ENCOUNTER — Ambulatory Visit
Admission: RE | Admit: 2021-04-13 | Discharge: 2021-04-13 | Disposition: A | Discharge status: Home / Self Care | Payer: Medicare HMO | Attending: Urology | Admitting: Urology

## 2021-04-13 ENCOUNTER — Encounter: Payer: Self-pay | Admitting: Urology

## 2021-04-13 ENCOUNTER — Encounter
Admission: RE | Disposition: A | Discharge status: Home / Self Care | Payer: Self-pay | Source: Home / Self Care | Attending: Urology

## 2021-04-13 ENCOUNTER — Ambulatory Visit: Payer: Medicare HMO

## 2021-04-13 DIAGNOSIS — Z7902 Long term (current) use of antithrombotics/antiplatelets: Secondary | ICD-10-CM | POA: Insufficient documentation

## 2021-04-13 DIAGNOSIS — N201 Calculus of ureter: Secondary | ICD-10-CM | POA: Insufficient documentation

## 2021-04-13 DIAGNOSIS — I2510 Atherosclerotic heart disease of native coronary artery without angina pectoris: Secondary | ICD-10-CM | POA: Insufficient documentation

## 2021-04-13 DIAGNOSIS — D759 Disease of blood and blood-forming organs, unspecified: Secondary | ICD-10-CM | POA: Diagnosis not present

## 2021-04-13 DIAGNOSIS — I10 Essential (primary) hypertension: Secondary | ICD-10-CM | POA: Insufficient documentation

## 2021-04-13 DIAGNOSIS — D649 Anemia, unspecified: Secondary | ICD-10-CM | POA: Insufficient documentation

## 2021-04-13 HISTORY — PX: CYSTOSCOPY/URETEROSCOPY/HOLMIUM LASER/STENT PLACEMENT: SHX6546

## 2021-04-13 SURGERY — CYSTOSCOPY/URETEROSCOPY/HOLMIUM LASER/STENT PLACEMENT
Anesthesia: General | Laterality: Right

## 2021-04-13 MED ORDER — ROCURONIUM BROMIDE 10 MG/ML (PF) SYRINGE
PREFILLED_SYRINGE | INTRAVENOUS | Status: AC
  Filled 2021-04-13: qty 10

## 2021-04-13 MED ORDER — ORAL CARE MOUTH RINSE: 15.0000 mL | Freq: Once | OROMUCOSAL | Status: AC

## 2021-04-13 MED ORDER — PROPOFOL 10 MG/ML IV BOLUS
INTRAVENOUS | Status: AC
  Filled 2021-04-13: qty 20

## 2021-04-13 MED ORDER — MEPERIDINE HCL 25 MG/ML IJ SOLN
6.2500 mg | INTRAMUSCULAR | Status: AC | PRN
  Administered 2021-04-13: 6.25 mg via INTRAVENOUS

## 2021-04-13 MED ORDER — CHLORHEXIDINE GLUCONATE 0.12 % MT SOLN
OROMUCOSAL | Status: AC
  Administered 2021-04-13: 15 mL via OROMUCOSAL
  Filled 2021-04-13: qty 15

## 2021-04-13 MED ORDER — LIDOCAINE HCL (CARDIAC) PF 100 MG/5ML IV SOSY
PREFILLED_SYRINGE | INTRAVENOUS | Status: DC | PRN
  Administered 2021-04-13: 100 mg via INTRAVENOUS

## 2021-04-13 MED ORDER — PROPOFOL 10 MG/ML IV BOLUS
INTRAVENOUS | Status: DC | PRN
  Administered 2021-04-13: 120 mg via INTRAVENOUS

## 2021-04-13 MED ORDER — ONDANSETRON HCL 4 MG/2ML IJ SOLN: 4.0000 mg | Freq: Once | INTRAMUSCULAR | Status: DC | PRN

## 2021-04-13 MED ORDER — KETOROLAC TROMETHAMINE 30 MG/ML IJ SOLN
INTRAMUSCULAR | Status: DC | PRN
  Administered 2021-04-13: 30 mg via INTRAVENOUS

## 2021-04-13 MED ORDER — ROCURONIUM BROMIDE 100 MG/10ML IV SOLN
INTRAVENOUS | Status: DC | PRN
  Administered 2021-04-13: 10 mg via INTRAVENOUS

## 2021-04-13 MED ORDER — SUGAMMADEX SODIUM 200 MG/2ML IV SOLN
INTRAVENOUS | Status: DC | PRN
  Administered 2021-04-13: 200 mg via INTRAVENOUS

## 2021-04-13 MED ORDER — CHLORHEXIDINE GLUCONATE 0.12 % MT SOLN: 15.0000 mL | Freq: Once | OROMUCOSAL | Status: AC

## 2021-04-13 MED ORDER — SUCCINYLCHOLINE CHLORIDE 200 MG/10ML IV SOSY
PREFILLED_SYRINGE | INTRAVENOUS | Status: AC
  Filled 2021-04-13: qty 10

## 2021-04-13 MED ORDER — STERILE WATER FOR IRRIGATION IR SOLN
Status: DC | PRN
  Administered 2021-04-13: 500 mL

## 2021-04-13 MED ORDER — CEFAZOLIN SODIUM-DEXTROSE 2-4 GM/100ML-% IV SOLN
2.0000 g | INTRAVENOUS | Status: AC
  Administered 2021-04-13: 2 g via INTRAVENOUS

## 2021-04-13 MED ORDER — MEPERIDINE HCL 25 MG/ML IJ SOLN
INTRAMUSCULAR | Status: AC
  Administered 2021-04-13: 6.25 mg via INTRAVENOUS
  Filled 2021-04-13: qty 1

## 2021-04-13 MED ORDER — FENTANYL CITRATE (PF) 100 MCG/2ML IJ SOLN: 25.0000 ug | INTRAMUSCULAR | Status: DC | PRN

## 2021-04-13 MED ORDER — SODIUM CHLORIDE 0.9 % IR SOLN
Status: DC | PRN
  Administered 2021-04-13: 3000 mL via INTRAVESICAL

## 2021-04-13 MED ORDER — DEXAMETHASONE SODIUM PHOSPHATE 10 MG/ML IJ SOLN
INTRAMUSCULAR | Status: AC
  Filled 2021-04-13: qty 1

## 2021-04-13 MED ORDER — CEFAZOLIN SODIUM-DEXTROSE 2-4 GM/100ML-% IV SOLN
INTRAVENOUS | Status: AC
  Filled 2021-04-13: qty 100

## 2021-04-13 MED ORDER — IOHEXOL 180 MG/ML  SOLN
INTRAMUSCULAR | Status: DC | PRN
  Administered 2021-04-13: 10 mL

## 2021-04-13 MED ORDER — ONDANSETRON HCL 4 MG/2ML IJ SOLN
INTRAMUSCULAR | Status: DC | PRN
  Administered 2021-04-13: 4 mg via INTRAVENOUS

## 2021-04-13 MED ORDER — FENTANYL CITRATE (PF) 100 MCG/2ML IJ SOLN
INTRAMUSCULAR | Status: DC | PRN
  Administered 2021-04-13: 50 ug via INTRAVENOUS

## 2021-04-13 MED ORDER — FENTANYL CITRATE (PF) 100 MCG/2ML IJ SOLN
INTRAMUSCULAR | Status: AC
  Filled 2021-04-13: qty 2

## 2021-04-13 MED ORDER — SUCCINYLCHOLINE CHLORIDE 200 MG/10ML IV SOSY
PREFILLED_SYRINGE | INTRAVENOUS | Status: DC | PRN
  Administered 2021-04-13: 100 mg via INTRAVENOUS
  Administered 2021-04-13: 20 mg via INTRAVENOUS

## 2021-04-13 MED ORDER — TAMSULOSIN HCL 0.4 MG PO CAPS: 0.4000 mg | ORAL_CAPSULE | Freq: Every day | ORAL | 0 refills | Status: AC

## 2021-04-13 MED ORDER — LACTATED RINGERS IV SOLN: INTRAVENOUS | Status: DC

## 2021-04-13 MED ORDER — FAMOTIDINE 20 MG PO TABS: 20.0000 mg | ORAL_TABLET | Freq: Once | ORAL | Status: AC

## 2021-04-13 MED ORDER — DEXAMETHASONE SODIUM PHOSPHATE 10 MG/ML IJ SOLN
INTRAMUSCULAR | Status: DC | PRN
  Administered 2021-04-13: 5 mg via INTRAVENOUS

## 2021-04-13 MED ORDER — ONDANSETRON HCL 4 MG/2ML IJ SOLN
INTRAMUSCULAR | Status: AC
  Filled 2021-04-13: qty 2

## 2021-04-13 MED ORDER — FAMOTIDINE 20 MG PO TABS
ORAL_TABLET | ORAL | Status: AC
  Administered 2021-04-13: 20 mg via ORAL
  Filled 2021-04-13: qty 1

## 2021-04-13 MED ORDER — LIDOCAINE HCL (PF) 2 % IJ SOLN
INTRAMUSCULAR | Status: AC
  Filled 2021-04-13: qty 5

## 2021-04-13 SURGICAL SUPPLY — 22 items
ADHESIVE MASTISOL STRL (MISCELLANEOUS) ×2
BAG DRAIN CYSTO-URO LG1000N (MISCELLANEOUS) ×2
CATH URETL 5X70 OPEN END (CATHETERS) ×2
DRAPE UTILITY 15X26 TOWEL STRL (DRAPES) ×2
DRSG TEGADERM 2-3/8X2-3/4 SM (GAUZE/BANDAGES/DRESSINGS) ×2
GAUZE 4X4 16PLY ~~LOC~~+RFID DBL (SPONGE) ×2
GLOVE SURG UNDER POLY LF SZ7.5 (GLOVE) ×2
GOWN STRL REUS W/ TWL LRG LVL3 (GOWN DISPOSABLE) ×1
GOWN STRL REUS W/ TWL XL LVL3 (GOWN DISPOSABLE) ×1
GOWN STRL REUS W/TWL LRG LVL3 (GOWN DISPOSABLE) ×1
GOWN STRL REUS W/TWL XL LVL3 (GOWN DISPOSABLE) ×1
GUIDEWIRE STR DUAL SENSOR (WIRE) ×2
INFUSOR MANOMETER BAG 3000ML (MISCELLANEOUS) ×2
IV NS IRRIG 3000ML ARTHROMATIC (IV SOLUTION) ×2
KIT TURNOVER CYSTO (KITS) ×2
PACK CYSTO AR (MISCELLANEOUS) ×2
SET CYSTO W/LG BORE CLAMP LF (SET/KITS/TRAYS/PACK) ×2
STENT URET 6FRX28 CONTOUR (STENTS) ×2
SURGILUBE 2OZ TUBE FLIPTOP (MISCELLANEOUS) ×2
SYR 10ML LL (SYRINGE) ×2
TRACTIP FLEXIVA PULSE ID 200 (Laser) ×2
WATER STERILE IRR 500ML POUR (IV SOLUTION) ×2

## 2021-04-13 NOTE — Discharge Instructions (Signed)
AMBULATORY SURGERY  ?DISCHARGE INSTRUCTIONS ? ? ?The drugs that you were given will stay in your system until tomorrow so for the next 24 hours you should not: ? ?Drive an automobile ?Make any legal decisions ?Drink any alcoholic beverage ? ? ?You may resume regular meals tomorrow.  Today it is better to start with liquids and gradually work up to solid foods. ? ?You may eat anything you prefer, but it is better to start with liquids, then soup and crackers, and gradually work up to solid foods. ? ? ?Please notify your doctor immediately if you have any unusual bleeding, trouble breathing, redness and pain at the surgery site, drainage, fever, or pain not relieved by medication. ? ? ? ?Additional Instructions: ? ? ? ?Please contact your physician with any problems or Same Day Surgery at [phone], Monday through Friday 6 am to 4 pm, or New  at [facility] Main number at [phone].  ?

## 2021-04-13 NOTE — Anesthesia Preprocedure Evaluation (Signed)
Anesthesia Evaluation  Patient identified by MRN, date of birth, ID band Patient awake    Reviewed: Allergy & Precautions, H&P , NPO status , Patient's Chart, lab work & pertinent test results, reviewed documented beta blocker date and time   Airway Mallampati: II  TM Distance: >3 FB Neck ROM: full    Dental  (+) Teeth Intact   Pulmonary neg pulmonary ROS,    Pulmonary exam normal        Cardiovascular Exercise Tolerance: Poor hypertension, On Medications + CAD  Normal cardiovascular exam Rhythm:regular Rate:Normal     Neuro/Psych negative neurological ROS  negative psych ROS   GI/Hepatic negative GI ROS, Neg liver ROS,   Endo/Other  negative endocrine ROS  Renal/GU negative Renal ROS  negative genitourinary   Musculoskeletal   Abdominal   Peds  Hematology  (+) Blood dyscrasia, anemia ,   Anesthesia Other Findings Past Medical History: No date: Cancer [facility])     Comment:  skin cancer basal No date: Carotid arterial disease ([place])     Comment:  a. 02/2016 L CEA; b. 25/6389 U/S: patent LICA, 3-73%               RICA. No date: Coronary artery disease     Comment:  a. 01/2016 MV: mild apical/basal inferior, apical               lateral, mid anterolateral, and mid inferolateral               ischemia. EF 57%; b. 02/2016 Cath: LM 40ost, LAD 70p/m,               20d, D1 95 (small), LCX nl, RCA 73m, RPDA 90 (small), EF               55-65%-->med Rx. Rec CABG for recurrent symptoms. No date: History of echocardiogram     Comment:  a. 02/2016 Echo: EF 50-55%, no rwma, mild MR. No date: History of kidney stones No date: Hypercholesterolemia No date: Hypertension Past Surgical History: 01/19/2016: CARDIAC CATHETERIZATION; N/A     Comment:  Procedure: Left Heart Cath and Coronary Angiography;                Surgeon: Wellington Hampshire, MD;  Location: [facility];  Service: Cardiovascular;   Laterality: N/A; No date: COLONOSCOPY 02/15/2016: ENDARTERECTOMY; Left     Comment:  Procedure: ENDARTERECTOMY CAROTID;  Surgeon: Algernon Huxley, MD;  Location: [facility] OR[facility];  Service: Vascular;                Laterality: Left;   Reproductive/Obstetrics negative OB ROS                             Anesthesia Physical Anesthesia Plan  ASA: 3  Anesthesia Plan: General ETT   Post-op Pain Management:    Induction:   PONV Risk Score and Plan:   Airway Management Planned:   Additional Equipment:   Intra-op Plan:   Post-operative Plan:   Informed Consent: I have reviewed the patients History and Physical, chart, labs and discussed the procedure including the risks, benefits and alternatives for the proposed anesthesia with the patient or authorized representative who has indicated his/her understanding and acceptance.     Dental Advisory  Given  Plan Discussed with: CRNA  Anesthesia Plan Comments:         Anesthesia Quick Evaluation

## 2021-04-13 NOTE — Anesthesia Postprocedure Evaluation (Signed)
Anesthesia Post Note  Patient: Brett Riley  Procedure(s) Performed: CYSTOSCOPY/URETEROSCOPY/HOLMIUM LASER/STENT PLACEMENT (Right)  Patient location during evaluation: PACU Anesthesia Type: General Level of consciousness: awake and alert Pain management: pain level controlled Vital Signs Assessment: post-procedure vital signs reviewed and stable Respiratory status: spontaneous breathing, nonlabored ventilation, respiratory function stable and patient connected to nasal cannula oxygen Cardiovascular status: blood pressure returned to baseline and stable Postop Assessment: no apparent nausea or vomiting Anesthetic complications: no   No notable events documented.   Last Vitals:  Vitals:   04/13/21 1400 04/13/21 1412  BP: (!) 169/93 (!) 175/84  Pulse: 63 65  Resp: 15 16  Temp:  (!) 36.3 C  SpO2: 97% 98%    Last Pain:  Vitals:   04/13/21 1412  TempSrc: Temporal  PainSc: 0-No pain                 Molli Barrows

## 2021-04-13 NOTE — Anesthesia Procedure Notes (Signed)
Procedure Name: Intubation Date/Time: 04/13/2021 12:50 PM Performed by: Fredderick Phenix, CRNA Pre-anesthesia Checklist: Patient identified, Emergency Drugs available, Suction available and Patient being monitored Patient Re-evaluated:Patient Re-evaluated prior to induction Oxygen Delivery Method: Circle system utilized Preoxygenation: Pre-oxygenation with 100% oxygen Induction Type: IV induction Ventilation: Mask ventilation without difficulty Laryngoscope Size: Mac and 4 Grade View: Grade II Tube type: Oral Tube size: 7.5 mm Number of attempts: 1 Airway Equipment and Method: Stylet and Oral airway Placement Confirmation: ETT inserted through vocal cords under direct vision, positive ETCO2 and breath sounds checked- equal and bilateral Secured at: 21 cm Tube secured with: Tape Dental Injury: Teeth and Oropharynx as per pre-operative assessment

## 2021-04-13 NOTE — Op Note (Signed)
Date of procedure: 04/13/21  Preoperative diagnosis:  Right ureteral stone  Postoperative diagnosis:  Same  Procedure: Cystoscopy, right ureteroscopy, laser lithotripsy, right retrograde pyelogram with intraoperative interpretation, right ureteral stent placement  Surgeon: Nickolas Madrid, MD  Anesthesia: General  Complications: None  Intraoperative findings:  Normal cystoscopy, stone crowning at right ureteral orifice Uncomplicated dusting of stone, all fragments irrigated free, stent placed with Dangler  EBL: Minimal  Specimens: None  Drains: Right 6 French by 28 cm ureteral stent  Indication: Brett Riley is a 86 y.o. patient with 6 mm right distal ureteral stone and ongoing renal colic with multiple ER visits who opted for ureteroscopy.  After reviewing the management options for treatment, they elected to proceed with the above surgical procedure(s). We have discussed the potential benefits and risks of the procedure, side effects of the proposed treatment, the likelihood of the patient achieving the goals of the procedure, and any potential problems that might occur during the procedure or recuperation. Informed consent has been obtained.  Description of procedure:  The patient was taken to the operating room and general anesthesia was induced. SCDs were placed for DVT prophylaxis. The patient was placed in the dorsal lithotomy position, prepped and draped in the usual sterile fashion, and preoperative antibiotics(Ancef) were administered. A preoperative time-out was performed.   A 21 French rigid cystoscope was used to intubate the urethra and a normal-appearing urethra was followed proximally to the bladder.  The prostate was moderate in size.  Thorough cystoscopy showed no suspicious lesions, and the stone was crowning at the right ureteral orifice.  With the aid of a 5 French access catheter I was able to navigate a sensor wire alongside the wire and up into the kidney  under fluoroscopic vision.  The semirigid ureteroscope was advanced alongside the wire, and a 200 m laser fiber on settings of 1.0 J and 10 Hz was used to methodically fragment the stone.  All stone fragments were irrigated free from the ureter into the bladder.  There was mild distal ureteral edema.  Retrograde pyelogram was performed from the distal ureter that showed no extravasation or filling defects.  The rigid cystoscope was backloaded over the wire and a 6 [place] by 28 cm ureteral stent was uneventfully placed with a curl in the upper pole, as well as under direct vision in the bladder.  The stone fragments were irrigated free from the bladder, and bladder drained.  The dangler was secured to the dorsal aspect of the penis with Mastisol and Tegaderm.  Disposition: Stable to PACU  Plan: Okay to resume Plavix Remove stent at home on Wednesday, 04/18/2021 Follow-up with urology as needed  Nickolas Madrid, MD

## 2021-04-13 NOTE — Interval H&P Note (Signed)
UROLOGY H&P UPDATE  Agree with prior H&P dated 04/10/2021.  Cardiac: RRR Lungs: CTA bilaterally  Laterality: Right Procedure: Right ureteroscopy, laser lithotripsy, stent placement  Urine: 11-20 RBCs, otherwise benign  We specifically discussed the risks ureteroscopy including bleeding, infection/sepsis, stent related symptoms including flank pain/urgency/frequency/incontinence/dysuria, ureteral injury, inability to access stone, or need for staged or additional procedures.   Billey Co, MD 04/13/2021

## 2021-04-13 NOTE — Transfer of Care (Signed)
Immediate Anesthesia Transfer of Care Note  Patient: Brett Riley  Procedure(s) Performed: CYSTOSCOPY/URETEROSCOPY/HOLMIUM LASER/STENT PLACEMENT (Right)  Patient Location: PACU  Anesthesia Type:General  Level of Consciousness: awake  Airway & Oxygen Therapy: Patient Spontanous Breathing and Patient connected to face mask oxygen  Post-op Assessment: Report given to RN and Post -op Vital signs reviewed and stable  Post vital signs: Reviewed and stable  Last Vitals:  Vitals Value Taken Time  BP 153/85 04/13/21 1335  Temp 36.4 C 04/13/21 1335  Pulse 73 04/13/21 1338  Resp 17 04/13/21 1338  SpO2 97 % 04/13/21 1338  Vitals shown include unvalidated device data.  Last Pain:  Vitals:   04/13/21 1335  TempSrc:   PainSc: Asleep         Complications: No notable events documented.

## 2021-04-18 ENCOUNTER — Telehealth: Payer: Self-pay | Admitting: *Deleted

## 2021-04-18 NOTE — Telephone Encounter (Signed)
Pt and wife calling asking for instruction on how to take out the stent. Per OP note, stent on dangler and ok to pull out. Pt advised and stated he would just pull it out. Advised to call if any further questions.

## 2021-05-10 ENCOUNTER — Other Ambulatory Visit: Payer: Self-pay

## 2021-05-10 ENCOUNTER — Ambulatory Visit (INDEPENDENT_AMBULATORY_CARE_PROVIDER_SITE_OTHER): Payer: Medicare HMO | Admitting: Internal Medicine

## 2021-05-10 VITALS — BP 136/74 | HR 77 | Temp 97.9°F | Resp 16 | Ht 72.0 in | Wt 190.8 lb

## 2021-05-10 DIAGNOSIS — I723 Aneurysm of iliac artery: Secondary | ICD-10-CM

## 2021-05-10 DIAGNOSIS — M79605 Pain in left leg: Secondary | ICD-10-CM | POA: Diagnosis not present

## 2021-05-10 DIAGNOSIS — I700 Atherosclerosis of aorta: Secondary | ICD-10-CM

## 2021-05-10 DIAGNOSIS — I2510 Atherosclerotic heart disease of native coronary artery without angina pectoris: Secondary | ICD-10-CM | POA: Diagnosis not present

## 2021-05-10 DIAGNOSIS — M79604 Pain in right leg: Secondary | ICD-10-CM | POA: Diagnosis not present

## 2021-05-10 DIAGNOSIS — D696 Thrombocytopenia, unspecified: Secondary | ICD-10-CM

## 2021-05-10 DIAGNOSIS — R935 Abnormal findings on diagnostic imaging of other abdominal regions, including retroperitoneum: Secondary | ICD-10-CM | POA: Diagnosis not present

## 2021-05-10 DIAGNOSIS — D649 Anemia, unspecified: Secondary | ICD-10-CM | POA: Diagnosis not present

## 2021-05-10 DIAGNOSIS — I779 Disorder of arteries and arterioles, unspecified: Secondary | ICD-10-CM | POA: Diagnosis not present

## 2021-05-10 DIAGNOSIS — E785 Hyperlipidemia, unspecified: Secondary | ICD-10-CM | POA: Diagnosis not present

## 2021-05-10 DIAGNOSIS — I10 Essential (primary) hypertension: Secondary | ICD-10-CM | POA: Diagnosis not present

## 2021-05-10 LAB — CBC WITH DIFFERENTIAL/PLATELET
Basophils Absolute: 0 K/uL (ref 0.0–0.1)
Basophils Relative: 0.9 % (ref 0.0–3.0)
Eosinophils Absolute: 0.2 K/uL (ref 0.0–0.7)
Eosinophils Relative: 4.9 % (ref 0.0–5.0)
HCT: 37.7 % — ABNORMAL LOW (ref 39.0–52.0)
Hemoglobin: 12.9 g/dL — ABNORMAL LOW (ref 13.0–17.0)
Lymphocytes Relative: 36.5 % (ref 12.0–46.0)
Lymphs Abs: 1.8 K/uL (ref 0.7–4.0)
MCHC: 34.2 g/dL (ref 30.0–36.0)
MCV: 95.3 fl (ref 78.0–100.0)
Monocytes Absolute: 0.4 K/uL (ref 0.1–1.0)
Monocytes Relative: 7.6 % (ref 3.0–12.0)
Neutro Abs: 2.4 K/uL (ref 1.4–7.7)
Neutrophils Relative %: 50.1 % (ref 43.0–77.0)
Platelets: 158 K/uL (ref 150.0–400.0)
RBC: 3.95 Mil/uL — ABNORMAL LOW (ref 4.22–5.81)
RDW: 13.7 % (ref 11.5–15.5)
WBC: 4.9 K/uL (ref 4.0–10.5)

## 2021-05-10 LAB — LIPID PANEL
Cholesterol: 120 mg/dL (ref 0–200)
HDL: 41 mg/dL
LDL Cholesterol: 61 mg/dL (ref 0–99)
NonHDL: 78.5
Total CHOL/HDL Ratio: 3
Triglycerides: 87 mg/dL (ref 0.0–149.0)
VLDL: 17.4 mg/dL (ref 0.0–40.0)

## 2021-05-10 LAB — HEPATIC FUNCTION PANEL
ALT: 13 U/L (ref 0–53)
AST: 19 U/L (ref 0–37)
Albumin: 4.5 g/dL (ref 3.5–5.2)
Alkaline Phosphatase: 70 U/L (ref 39–117)
Bilirubin, Direct: 0.1 mg/dL (ref 0.0–0.3)
Total Bilirubin: 0.6 mg/dL (ref 0.2–1.2)
Total Protein: 7.3 g/dL (ref 6.0–8.3)

## 2021-05-10 LAB — BASIC METABOLIC PANEL WITH GFR
BUN: 24 mg/dL — ABNORMAL HIGH (ref 6–23)
CO2: 30 meq/L (ref 19–32)
Calcium: 10 mg/dL (ref 8.4–10.5)
Chloride: 103 meq/L (ref 96–112)
Creatinine, Ser: 0.97 mg/dL (ref 0.40–1.50)
GFR: 72.26 mL/min
Glucose, Bld: 95 mg/dL (ref 70–99)
Potassium: 4.5 meq/L (ref 3.5–5.1)
Sodium: 139 meq/L (ref 135–145)

## 2021-05-10 LAB — IBC + FERRITIN
Ferritin: 216.9 ng/mL (ref 22.0–322.0)
Iron: 71 ug/dL (ref 42–165)
Saturation Ratios: 22 % (ref 20.0–50.0)
TIBC: 322 ug/dL (ref 250.0–450.0)
Transferrin: 230 mg/dL (ref 212.0–360.0)

## 2021-05-10 NOTE — Progress Notes (Addendum)
Patient ID: Brett Riley, male   DOB: March 12, 1935, 86 y.o.   MRN: [id number]   Subjective:    Patient ID: Brett Riley, male    DOB: 1935-04-07, 86 y.o.   MRN: [id number]  This visit occurred during the SARS-CoV-2 public health emergency.  Safety protocols were in place, including screening questions prior to the visit, additional usage of staff PPE, and extensive cleaning of exam room while observing appropriate contact time as indicated for disinfecting solutions.   Patient here for work in appt.   Chief Complaint  Patient presents with   Discuss incidental finding on CT scan   .   HPI Recently evaluated 04/09/21 and 04/10/21 - pain - kidney stone.  Was given pain medication, flomax and zofran.  Saw urology.  Is s/p ureteroscopy, laser lithotripsy and stent placement.  He has done well since his procedure.  Urinating ok.  No pain. CT from that visit revealed a question of lytic lesion - acetabulum.  Here to discuss further w/up.  He denies any pain - hip.  No chest pain or sob.  Eating and drinking well.  No nausea or vomiting.  No abdominal pain.  No bowel problems.  States checks blood pressures at home - usually [id number] systolic readings.    Past Medical History:  Diagnosis Date   Cancer [facility])    skin cancer basal   Carotid arterial disease ([place])    a. 02/2016 L CEA; b. 32/9924 U/S: patent LICA, 2-68% RICA.   Coronary artery disease    a. 01/2016 MV: mild apical/basal inferior, apical lateral, mid anterolateral, and mid inferolateral ischemia. EF 57%; b. 02/2016 Cath: LM 40ost, LAD 70p/m, 20d, D1 95 (small), LCX nl, RCA 65m RPDA 90 (small), EF 55-65%-->med Rx. Rec CABG for recurrent symptoms.   History of echocardiogram    a. 02/2016 Echo: EF 50-55%, no rwma, mild MR.   History of kidney stones    Hypercholesterolemia    Hypertension    Past Surgical History:  Procedure Laterality Date   CARDIAC CATHETERIZATION N/A 01/19/2016   Procedure: Left Heart Cath and Coronary Angiography;   Surgeon: MWellington Hampshire MD;  [facility];  Service: Cardiovascular;  Laterality: N/A;   COLONOSCOPY     CYSTOSCOPY/URETEROSCOPY/HOLMIUM LASER/STENT PLACEMENT Right 04/13/2021   Procedure: CYSTOSCOPY/URETEROSCOPY/HOLMIUM LASER/STENT PLACEMENT;  Surgeon: SBilley Co MD;  Location: [facility];  Service: Urology;  Laterality: Right;   ENDARTERECTOMY Left 02/15/2016   Procedure: ENDARTERECTOMY CAROTID;  Surgeon: JAlgernon Huxley MD;  Location: [facility];  Service: Vascular;  Laterality: Left;   Family History  Problem Relation Age of Onset   Heart disease Father        MI   Heart attack Father    Stroke Mother    Breast cancer Sister    Colon cancer Neg Hx    Prostate cancer Neg Hx    Social History   Socioeconomic History   Marital status: Married    Spouse name: Not on file   Number of children: 1   Years of education: Not on file   Highest education level: Not on file  Occupational History    Employer: [employer]  Tobacco Use   Smoking status: Never   Smokeless tobacco: Never  Vaping Use   Vaping Use: Never used  Substance and Sexual Activity   Alcohol use: No    Alcohol/week: 0.0 standard drinks   Drug use: No   Sexual activity: Not Currently  Other  Topics Concern   Not on file  Social History Narrative   Not on file   Social Determinants of Health   Financial Resource Strain: Low Risk    Difficulty of Paying Living Expenses: Not hard at all  Food Insecurity: No Food Insecurity   Worried About Charity fundraiser in the Last Year: Never true   [place] in the Last Year: Never true  Transportation Needs: No Transportation Needs   Lack of Transportation (Medical): No   Lack of Transportation (Non-Medical): No  Physical Activity: Insufficiently Active   Days of Exercise per Week: 3 days   Minutes of Exercise per Session: 20 min  Stress: No Stress Concern Present   Feeling of Stress : Not at all  Social Connections: Unknown    Frequency of Communication with Friends and Family: Not on file   Frequency of Social Gatherings with Friends and Family: Not on file   Attends Religious Services: Not on file   Active Member of Clubs or Organizations: Not on file   Attends Archivist Meetings: Not on file   Marital Status: Married     Review of Systems  Constitutional:  Negative for appetite change, fever and unexpected weight change.  HENT:  Negative for congestion and sinus pressure.   Respiratory:  Negative for cough, chest tightness and shortness of breath.   Cardiovascular:  Negative for chest pain, palpitations and leg swelling.  Gastrointestinal:  Negative for abdominal pain, diarrhea, nausea and vomiting.  Genitourinary:  Negative for difficulty urinating and dysuria.  Musculoskeletal:  Negative for joint swelling and myalgias.  Skin:  Negative for color change and rash.  Neurological:  Negative for dizziness, light-headedness and headaches.  Psychiatric/Behavioral:  Negative for agitation and dysphoric mood.       Objective:     BP 136/74    Pulse 77    Temp 97.9 F (36.6 C)    Resp 16    Ht 6' (1.829 m)    Wt 190 lb 12.8 oz (86.5 kg)    SpO2 98%    BMI 25.88 kg/m  Wt Readings from Last 3 Encounters:  05/10/21 190 lb 12.8 oz (86.5 kg)  04/10/21 195 lb 1.7 oz (88.5 kg)  04/10/21 195 lb (88.5 kg)    Physical Exam Constitutional:      General: He is not in acute distress.    Appearance: Normal appearance. He is well-developed.  HENT:     Head: Normocephalic and atraumatic.     Right Ear: External ear normal.     Left Ear: External ear normal.  Eyes:     General: No scleral icterus.       Right eye: No discharge.        Left eye: No discharge.  Cardiovascular:     Rate and Rhythm: Normal rate and regular rhythm.  Pulmonary:     Effort: Pulmonary effort is normal. No respiratory distress.     Breath sounds: Normal breath sounds.  Abdominal:     General: Bowel sounds are normal.      Palpations: Abdomen is soft.     Tenderness: There is no abdominal tenderness.  Musculoskeletal:        General: No swelling or tenderness.     Cervical back: Neck supple. No tenderness.     Comments: No pain in groin with abduction/adduction of lower extremities.  No pain with SLR.   Lymphadenopathy:     Cervical: No cervical adenopathy.  Skin:    Findings: No erythema or rash.  Neurological:     Mental Status: He is alert.  Psychiatric:        Mood and Affect: Mood normal.        Behavior: Behavior normal.     Outpatient Encounter Medications as of 05/10/2021  Medication Sig   aspirin 81 MG tablet Take 81 mg by mouth at bedtime.    clopidogrel (PLAVIX) 75 MG tablet TAKE 1 TABLET BY MOUTH DAILY WITH BREAKFAST.   lisinopril (ZESTRIL) 40 MG tablet TAKE 1 TABLET BY MOUTH EVERY DAY   metoprolol succinate (TOPROL-XL) 100 MG 24 hr tablet TAKE 1 TABLET BY MOUTH EVERY DAY WITH A MEAL (Patient taking differently: 100 mg every morning. TAKE 1 TABLET BY MOUTH EVERY DAY WITH A MEAL)   Multiple Vitamins-Iron (MULTIVITAMINS WITH IRON) TABS Take 1 tablet by mouth daily.   rosuvastatin (CRESTOR) 10 MG tablet TAKE 1 TABLET BY MOUTH EVERY DAY (Patient taking differently: Take 10 mg by mouth at bedtime.)   vitamin B-12 (CYANOCOBALAMIN) 1000 MCG tablet Take 1,000 mcg by mouth daily.   [DISCONTINUED] ondansetron (ZOFRAN) 4 MG tablet Take 1 tablet (4 mg total) by mouth every 8 (eight) hours as needed for up to 10 doses for nausea or vomiting.   [DISCONTINUED] ondansetron (ZOFRAN-ODT) 4 MG disintegrating tablet Take 1 tablet (4 mg total) by mouth every 8 (eight) hours as needed for nausea or vomiting.   [DISCONTINUED] oxyCODONE-acetaminophen (PERCOCET/ROXICET) 5-325 MG tablet Take 1 tablet by mouth every 4 (four) hours as needed for severe pain.   No facility-administered encounter medications on file as of 05/10/2021.     Lab Results  Component Value Date   WBC 4.9 05/10/2021   HGB 12.9 (L) 05/10/2021    HCT 37.7 (L) 05/10/2021   PLT 158.0 05/10/2021   GLUCOSE 95 05/10/2021   CHOL 120 05/10/2021   TRIG 87.0 05/10/2021   HDL 41.00 05/10/2021   LDLCALC 61 05/10/2021   ALT 13 05/10/2021   AST 19 05/10/2021   NA 139 05/10/2021   K 4.5 05/10/2021   CL 103 05/10/2021   CREATININE 0.97 05/10/2021   BUN 24 (H) 05/10/2021   CO2 30 05/10/2021   TSH 3.90 08/11/2020   PSA 0.80 12/08/2019   INR 1.08 02/13/2016    DG OR UROLOGY CYSTO IMAGE ([facility] ONLY)  Result Date: 04/13/2021 There is no interpretation for this exam.  This order is for images obtained during a surgical procedure.  Please See "Surgeries" Tab for more information regarding the procedure.       Assessment & Plan:   Problem List Items Addressed This Visit     Abnormal CT of the abdomen - Primary    Lytic lesion in the left acetabulum which measures 2.5 cm, with a narrow zone of transition but which demonstrates an internal soft tissue density component and possibly communicates with the joint space. While this may reflect a benign lesion such as a subchondral cyst/geode a lytic metastasis is a differential consideration, consider further evaluation with nonemergent MRI with and without contrast. Discussed with him today.  Agreeable for MRI.       Relevant Orders   MR HIP LEFT W WO CONTRAST   Anemia    Colonoscopy 08/2013 - normal.  hgb slightly decreased on recent check.  Recheck cbc today.  Check iron studies.        Relevant Orders   CBC with Differential/Platelet (Completed)   IBC + Ferritin (Completed)  Vitamin B12   Aneurysm artery, iliac common (HCC)    Followed by [facility].       Aortic atherosclerosis ([place])    Continue crestor.        CAD (coronary artery disease)    Previous cath - 2 vessel disease.  Risk factor modification.  Continue crestor.       Carotid artery disease (HCC)    S/p left ICA CEA.  Followed by Dr Lucky Cowboy.  Continue risk factor modification.  Continue daily aspirin.       Hyperlipidemia     On crestor.  Low cholesterol diet and exercise.  Follow lipid panel and liver function tests.        Relevant Orders   Lipid panel (Completed)   Hepatic function panel (Completed)   Hypertension    Blood pressure as outlined.  On lisinopril '40mg'$  q day.  Follow pressures.  Follow metabolic panel.  Continue current dose of metoprolol.       Relevant Orders   Basic metabolic panel (Completed)   Leg pain    Discussed previous leg pain.  Still notices.  Has been to PT.  Exercises.  Feels is stable.  Does not limit activity.  Follow       Thrombocytopenia ([place])    Saw hematology.  Recommended f/u 2-3x/year.  Check cbc today.         Einar Pheasant, MD

## 2021-05-11 ENCOUNTER — Other Ambulatory Visit (INDEPENDENT_AMBULATORY_CARE_PROVIDER_SITE_OTHER): Payer: Medicare HMO

## 2021-05-11 ENCOUNTER — Encounter: Payer: Self-pay | Admitting: Internal Medicine

## 2021-05-11 DIAGNOSIS — D649 Anemia, unspecified: Secondary | ICD-10-CM | POA: Diagnosis not present

## 2021-05-11 LAB — VITAMIN B12: Vitamin B-12: 1121 pg/mL — ABNORMAL HIGH (ref 211–911)

## 2021-05-11 NOTE — Assessment & Plan Note (Signed)
Saw hematology.  Recommended f/u 2-3x/year.  Check cbc today.  ?

## 2021-05-11 NOTE — Assessment & Plan Note (Signed)
Lytic lesion in the left acetabulum which measures 2.5 cm, with a narrow zone of transition but which demonstrates an internal soft tissue density component and possibly communicates with the joint ?space. While this may reflect a benign lesion such as a subchondral cyst/geode a lytic metastasis is a differential consideration, consider further evaluation with nonemergent MRI with and without contrast. Discussed with him today.  Agreeable for MRI.  ?

## 2021-05-11 NOTE — Assessment & Plan Note (Signed)
S/p left ICA CEA.  Followed by Dr Lucky Cowboy.  Continue risk factor modification.  Continue daily aspirin.  ?

## 2021-05-11 NOTE — Addendum Note (Signed)
Addended by: Alisa Graff on: 05/11/2021 03:38 AM ? ? Modules accepted: Orders ? ?

## 2021-05-11 NOTE — Assessment & Plan Note (Signed)
Previous cath - 2 vessel disease.  Risk factor modification.  Continue crestor.  

## 2021-05-11 NOTE — Assessment & Plan Note (Signed)
Discussed previous leg pain.  Still notices.  Has been to PT.  Exercises.  Feels is stable.  Does not limit activity.  Follow  ?

## 2021-05-11 NOTE — Assessment & Plan Note (Signed)
Colonoscopy 08/2013 - normal.  hgb slightly decreased on recent check.  Recheck cbc today.  Check iron studies.   ?

## 2021-05-11 NOTE — Assessment & Plan Note (Signed)
Blood pressure as outlined.  On lisinopril '40mg'$  q day.  Follow pressures.  Follow metabolic panel.  Continue current dose of metoprolol.  ?

## 2021-05-11 NOTE — Assessment & Plan Note (Signed)
On crestor.  Low cholesterol diet and exercise.  Follow lipid panel and liver function tests.   

## 2021-05-11 NOTE — Assessment & Plan Note (Signed)
Followed by [facility].   

## 2021-05-11 NOTE — Assessment & Plan Note (Signed)
Continue crestor 

## 2021-05-14 ENCOUNTER — Telehealth: Payer: Self-pay | Admitting: Internal Medicine

## 2021-05-14 NOTE — Telephone Encounter (Signed)
Lft vm on wife number to call ofc to sch MRI. thanks ?

## 2021-05-25 ENCOUNTER — Other Ambulatory Visit: Payer: Self-pay

## 2021-05-25 ENCOUNTER — Ambulatory Visit
Admission: RE | Admit: 2021-05-25 | Discharge: 2021-05-25 | Disposition: A | Discharge status: Home / Self Care | Payer: Medicare HMO | Source: Ambulatory Visit | Attending: Internal Medicine | Admitting: Internal Medicine

## 2021-05-25 DIAGNOSIS — R935 Abnormal findings on diagnostic imaging of other abdominal regions, including retroperitoneum: Secondary | ICD-10-CM | POA: Insufficient documentation

## 2021-05-25 DIAGNOSIS — M898X5 Other specified disorders of bone, thigh: Secondary | ICD-10-CM | POA: Diagnosis not present

## 2021-05-25 DIAGNOSIS — R600 Localized edema: Secondary | ICD-10-CM | POA: Diagnosis not present

## 2021-05-25 DIAGNOSIS — M1612 Unilateral primary osteoarthritis, left hip: Secondary | ICD-10-CM | POA: Diagnosis not present

## 2021-05-25 IMAGING — MR MR HIP*L* WO/W CM
9 series · 40 of 40 positions shown · IV contrast (gadavist)
Comparison: CT [DATE]

CLINICAL DATA: Evaluate incidentally discovered left acetabular
bone lesion

EXAM:
MRI OF THE LEFT HIP WITHOUT AND WITH CONTRAST
TECHNIQUE: Multiplanar, multisequence MR imaging was performed both before and
after administration of intravenous contrast.
CONTRAST:  8mL GADAVIST GADOBUTROL 1 MMOL/ML IV SOLN

[Series 5: T1 · coronal · left · 4.0mm · 1.19mm/px · 6 of 40 slices shown]
[im 1/40]
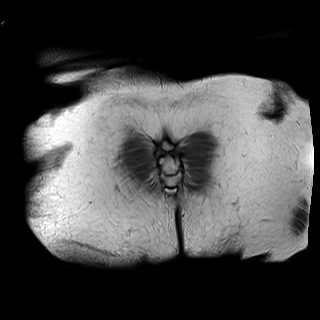
[im 8/40]
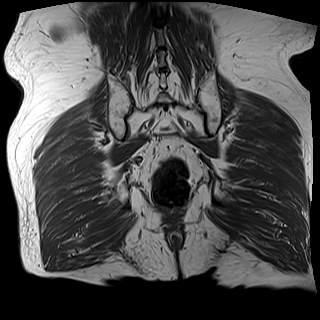
[im 16/40]
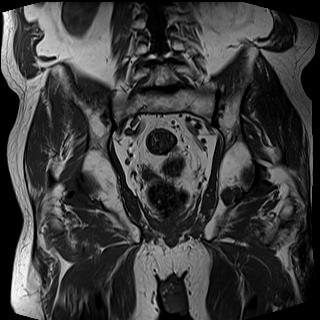
[im 24/40]
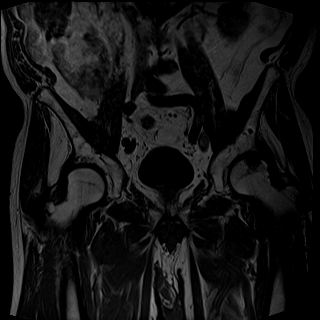
[im 32/40]
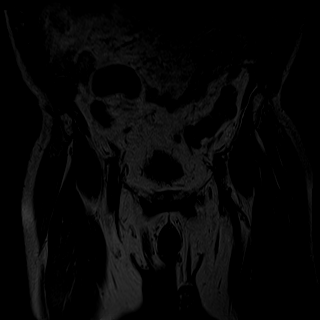
[im 40/40]
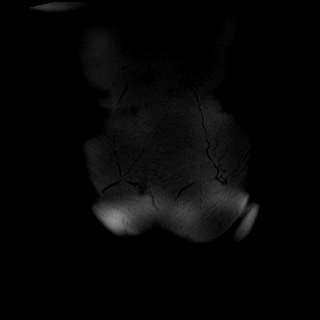

[Series 6: STIR · coronal · left · 4.0mm · 1.19mm/px · 6 of 40 slices shown]
[im 1/40]
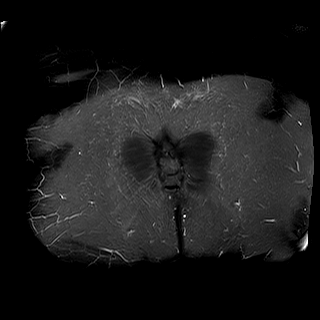
[im 8/40]
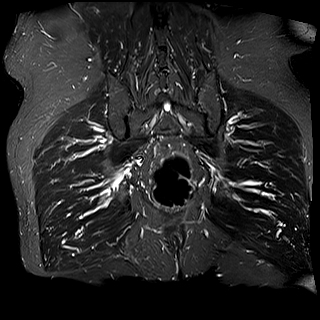
[im 16/40]
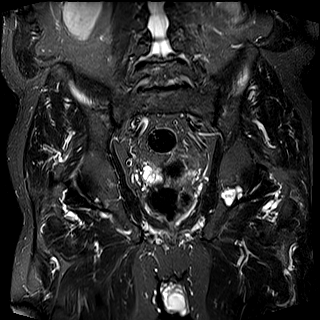
[im 24/40]
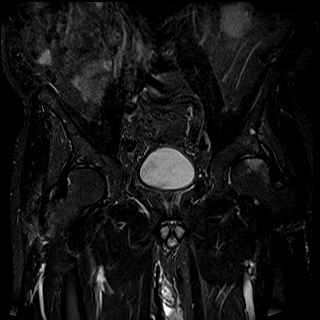
[im 32/40]
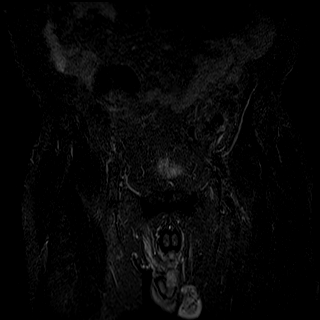
[im 40/40]
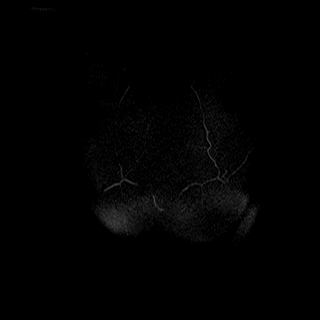

[Series 7: T2 fat-sat · axial · left · 4.0mm · 0.70mm/px · z∈[-106,+39]mm · 4 of 30 slices shown]
[im 1/30]
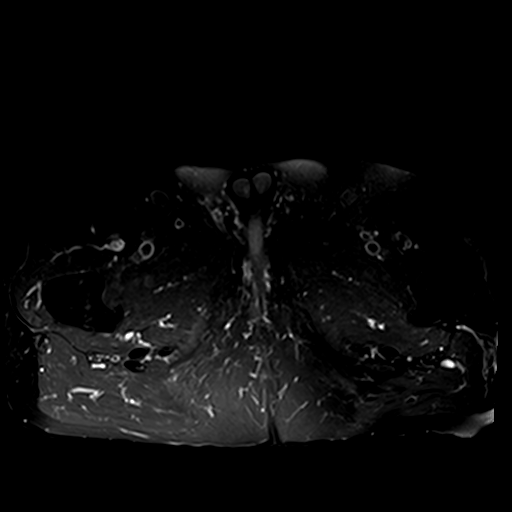
[im 10/30]
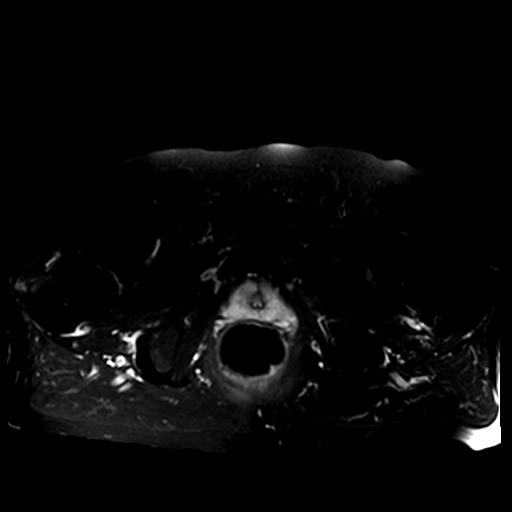
[im 20/30]
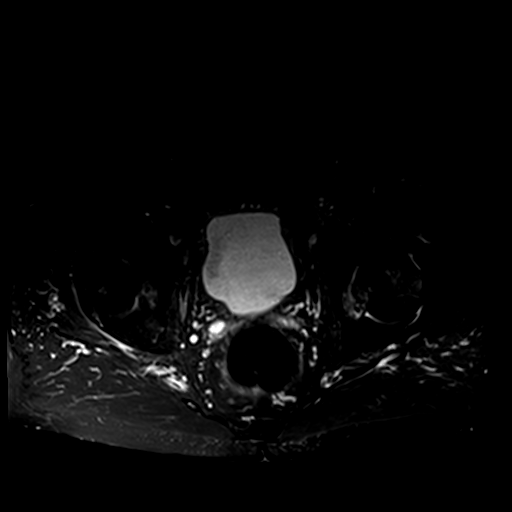
[im 30/30]
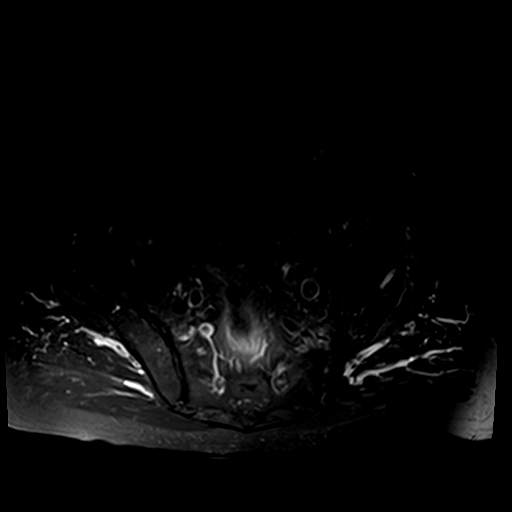

[Series 8: PD fat-sat · sagittal · left · 4.0mm · 0.70mm/px · 4 of 29 slices shown (1 of 3)]
[im 1/29]
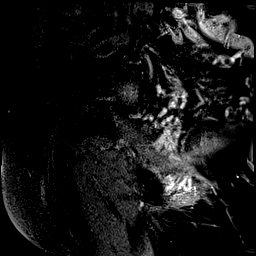
[im 10/29]
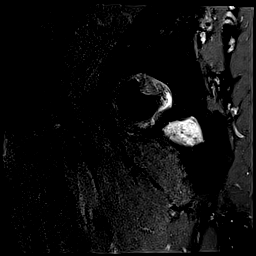
[im 19/29]
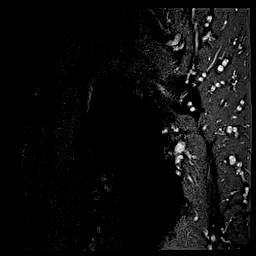
[im 29/29]
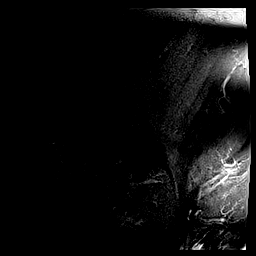

[Series 11: PD fat-sat · coronal · left · 4.0mm · 0.70mm/px · 4 of 29 slices shown (2 of 3)]
[im 1/29]
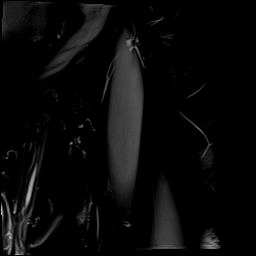
[im 10/29]
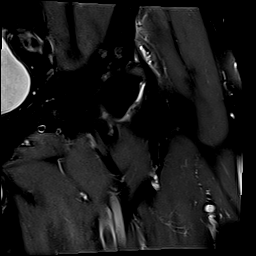
[im 19/29]
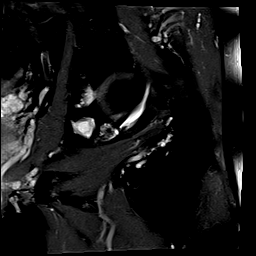
[im 29/29]
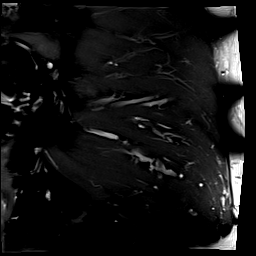

[Series 12: T1 fat-sat · axial · non-contrast · left · 4.0mm · 0.70mm/px · z∈[-118,+27]mm · 4 of 30 slices shown]
[im 1/30]
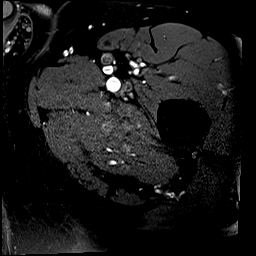
[im 10/30]
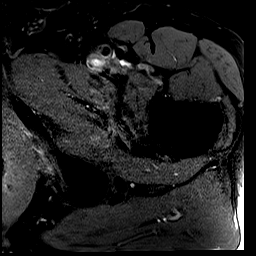
[im 20/30]
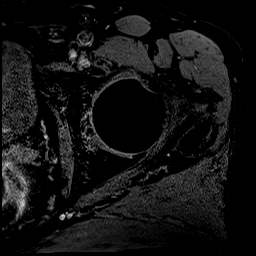
[im 30/30]
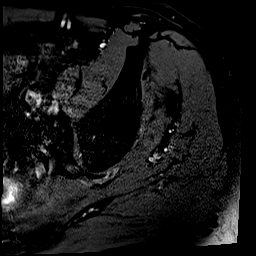

[Series 13: PD fat-sat · sagittal · left · 4.0mm · 0.70mm/px · 4 of 29 slices shown (3 of 3)]
[im 1/29]
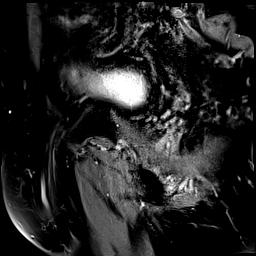
[im 10/29]
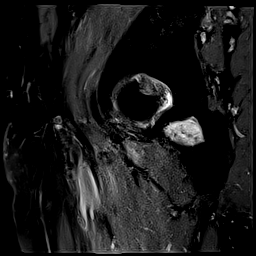
[im 19/29]
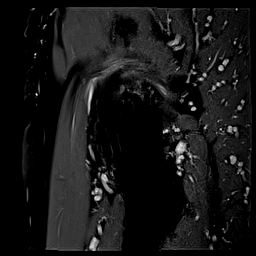
[im 29/29]
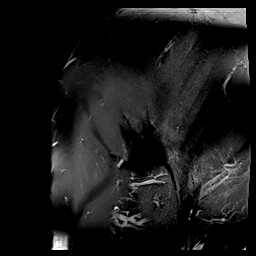

[Series 15: T1 fat-sat post-contrast · axial · left · 4.0mm · 0.70mm/px · z∈[-118,+27]mm · 4 of 30 slices shown (1 of 2)]
[im 1/30]
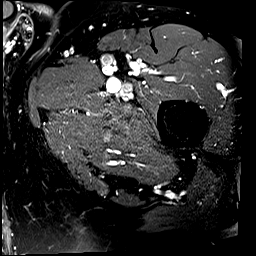
[im 10/30]
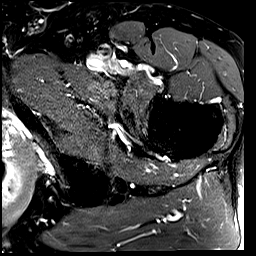
[im 20/30]
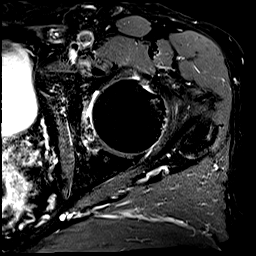
[im 30/30]
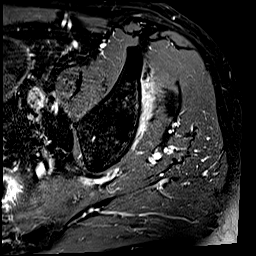

[Series 16: T1 fat-sat post-contrast · coronal · left · 3.0mm · 0.70mm/px · 4 of 29 slices shown (2 of 2)]
[im 1/29]
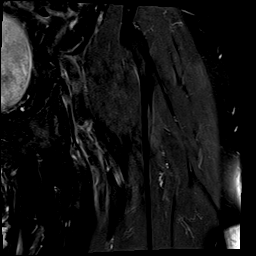
[im 10/29]
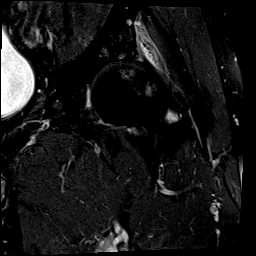
[im 19/29]
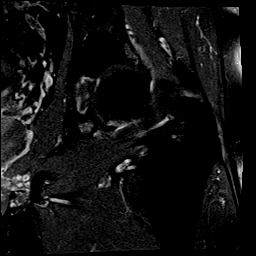
[im 29/29]
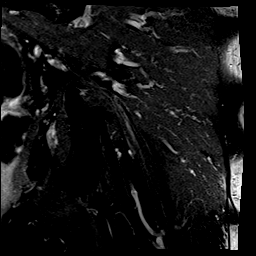

[40 of 40 positions shown; findings below may reference images not displayed]

FINDINGS: Bones: Well-defined cystic lesion within the posterior left
acetabulum measuring 3.3 x 2.3 x 2.8 cm (series 7, image 17). Lesion
is T2 hyperintense with a few thin internal septations. Thin
peripheral and septal enhancement on postcontrast sequences. Lesion
is contiguous with the left hip joint space (series 11, images
19-21). No solid component. No marrow edema or other signal
abnormality within the adjacent bone. Findings are most consistent
with a intraosseous ganglion cyst.

No acute fracture. No dislocation. No femoral head avascular
necrosis. Bony pelvis intact without diastasis. SI joints and pubic
symphysis within normal limits. Mild-moderate osteoarthritis of the
right hip joint with mild subchondral marrow edema. Multilevel
degenerative disc disease of the lower lumbar spine with associated
discogenic endplate marrow changes. No extra-articular bone marrow
edema. No suspicious marrow replacing bone lesion.

Articular cartilage and labrum

Articular cartilage: Diffuse cartilage thinning with areas of
full-thickness cartilage loss involving the superior aspect of the
femoral head and acetabulum. Prominent subchondral bone marrow edema
within the femoral head superiorly. No well-defined subchondral
fracture line.

Labrum:  Degenerative fraying of the superior labrum.

Joint or bursal effusion

Joint effusion:  None.

Bursae: No abnormal bursal fluid collection.

Muscles and tendons

Muscles and tendons: Severe tendinosis of the right gluteus minimus
tendon. The left gluteal, hamstring, iliopsoas, rectus femoris, and
adductor tendons appear intact without tear or significant
tendinosis. Normal muscle bulk and signal intensity without edema,
atrophy, or fatty infiltration.

Other findings

Miscellaneous: No soft tissue edema or fluid collection. No inguinal
lymphadenopathy. No enhancing soft tissue mass. Mildly enlarged
prostate gland.
IMPRESSION: 1. Well-defined 3.3 cm cystic lesion within the posterior left
acetabulum measuring up to 3.3 cm, contiguous with the left hip
joint space. Findings are most consistent with a intraosseous
ganglion cyst. No suspicious features are identified.
2. Moderate osteoarthritis of the bilateral hips, left worse than
right.
3. Prominent subchondral bone marrow edema within the left femoral
head superiorly. No well-defined subchondral fracture line is
identified.
4. Severe tendinosis of the right gluteus minimus tendon.
5. Mildly enlarged prostate gland.

## 2021-05-25 MED ORDER — GADOBUTROL 1 MMOL/ML IV SOLN
8.0000 mL | Freq: Once | INTRAVENOUS | Status: AC | PRN
  Administered 2021-05-25: 8 mL via INTRAVENOUS

## 2021-05-31 ENCOUNTER — Other Ambulatory Visit: Payer: Self-pay | Admitting: Internal Medicine

## 2021-06-05 ENCOUNTER — Telehealth: Payer: Self-pay | Admitting: Internal Medicine

## 2021-06-05 NOTE — Telephone Encounter (Signed)
Pt wife called in requesting result about pt MRI... Pt wife stated that she think the results stated that Pt have arthritis.Marland KitchenMarland KitchenMarland KitchenPt wife is wondering what type of medication can the pt take if it is arthritis... Pt wife requesting callback  ?

## 2021-06-06 NOTE — Telephone Encounter (Signed)
Per [employer] note, wife was advised. ?

## 2021-06-06 NOTE — Telephone Encounter (Signed)
Would first recommend tylenol arthritis - can take two tablets bid.  Let us know if persistent problems.  ?

## 2021-06-06 NOTE — Telephone Encounter (Signed)
Pt spouse is calling about what medication can be taken for arthritis. Pt is not sleeping because of the pain ?

## 2021-06-06 NOTE — Telephone Encounter (Signed)
Pt wife advised 2 tabs BID . Agreeable to regiment.  ?

## 2021-07-19 DIAGNOSIS — I2510 Atherosclerotic heart disease of native coronary artery without angina pectoris: Secondary | ICD-10-CM | POA: Diagnosis not present

## 2021-07-19 DIAGNOSIS — N529 Male erectile dysfunction, unspecified: Secondary | ICD-10-CM | POA: Diagnosis not present

## 2021-07-19 DIAGNOSIS — Z8249 Family history of ischemic heart disease and other diseases of the circulatory system: Secondary | ICD-10-CM | POA: Diagnosis not present

## 2021-07-19 DIAGNOSIS — Z823 Family history of stroke: Secondary | ICD-10-CM | POA: Diagnosis not present

## 2021-07-19 DIAGNOSIS — E785 Hyperlipidemia, unspecified: Secondary | ICD-10-CM | POA: Diagnosis not present

## 2021-07-19 DIAGNOSIS — Z809 Family history of malignant neoplasm, unspecified: Secondary | ICD-10-CM | POA: Diagnosis not present

## 2021-07-19 DIAGNOSIS — Z7982 Long term (current) use of aspirin: Secondary | ICD-10-CM | POA: Diagnosis not present

## 2021-07-19 DIAGNOSIS — I10 Essential (primary) hypertension: Secondary | ICD-10-CM | POA: Diagnosis not present

## 2021-07-19 DIAGNOSIS — Z85828 Personal history of other malignant neoplasm of skin: Secondary | ICD-10-CM | POA: Diagnosis not present

## 2021-07-19 DIAGNOSIS — Z7902 Long term (current) use of antithrombotics/antiplatelets: Secondary | ICD-10-CM | POA: Diagnosis not present

## 2021-08-25 ENCOUNTER — Other Ambulatory Visit: Payer: Self-pay | Admitting: Internal Medicine

## 2021-08-29 ENCOUNTER — Telehealth: Payer: Self-pay | Admitting: Internal Medicine

## 2021-08-29 NOTE — Telephone Encounter (Signed)
Spoke with patient he declined AWV req CB next month,  He is working Biochemist, clinical

## 2021-09-14 ENCOUNTER — Encounter: Payer: Medicare HMO | Admitting: Internal Medicine

## 2021-10-11 ENCOUNTER — Encounter: Payer: Self-pay | Admitting: Internal Medicine

## 2021-10-11 ENCOUNTER — Ambulatory Visit (INDEPENDENT_AMBULATORY_CARE_PROVIDER_SITE_OTHER): Payer: Medicare HMO | Admitting: Internal Medicine

## 2021-10-11 VITALS — BP 134/70 | HR 65 | Temp 97.5°F | Resp 17 | Ht 72.0 in | Wt 195.0 lb

## 2021-10-11 DIAGNOSIS — D696 Thrombocytopenia, unspecified: Secondary | ICD-10-CM

## 2021-10-11 DIAGNOSIS — Z7185 Encounter for immunization safety counseling: Secondary | ICD-10-CM | POA: Diagnosis not present

## 2021-10-11 DIAGNOSIS — I2510 Atherosclerotic heart disease of native coronary artery without angina pectoris: Secondary | ICD-10-CM | POA: Diagnosis not present

## 2021-10-11 DIAGNOSIS — R935 Abnormal findings on diagnostic imaging of other abdominal regions, including retroperitoneum: Secondary | ICD-10-CM

## 2021-10-11 DIAGNOSIS — E785 Hyperlipidemia, unspecified: Secondary | ICD-10-CM | POA: Diagnosis not present

## 2021-10-11 DIAGNOSIS — I10 Essential (primary) hypertension: Secondary | ICD-10-CM | POA: Diagnosis not present

## 2021-10-11 DIAGNOSIS — D649 Anemia, unspecified: Secondary | ICD-10-CM

## 2021-10-11 DIAGNOSIS — I700 Atherosclerosis of aorta: Secondary | ICD-10-CM

## 2021-10-11 DIAGNOSIS — L989 Disorder of the skin and subcutaneous tissue, unspecified: Secondary | ICD-10-CM

## 2021-10-11 DIAGNOSIS — I779 Disorder of arteries and arterioles, unspecified: Secondary | ICD-10-CM

## 2021-10-11 DIAGNOSIS — Z0000 Encounter for general adult medical examination without abnormal findings: Secondary | ICD-10-CM

## 2021-10-11 DIAGNOSIS — I723 Aneurysm of iliac artery: Secondary | ICD-10-CM | POA: Diagnosis not present

## 2021-10-11 LAB — CBC WITH DIFFERENTIAL/PLATELET
Basophils Absolute: 0 10*3/uL (ref 0.0–0.1)
Basophils Relative: 0.6 % (ref 0.0–3.0)
Eosinophils Absolute: 0.2 10*3/uL (ref 0.0–0.7)
Eosinophils Relative: 3.3 % (ref 0.0–5.0)
HCT: 38.1 % — ABNORMAL LOW (ref 39.0–52.0)
Hemoglobin: 12.8 g/dL — ABNORMAL LOW (ref 13.0–17.0)
Lymphocytes Relative: 37 % (ref 12.0–46.0)
Lymphs Abs: 2 10*3/uL (ref 0.7–4.0)
MCHC: 33.7 g/dL (ref 30.0–36.0)
MCV: 96.9 fl (ref 78.0–100.0)
Monocytes Absolute: 0.4 10*3/uL (ref 0.1–1.0)
Monocytes Relative: 8.1 % (ref 3.0–12.0)
Neutro Abs: 2.7 10*3/uL (ref 1.4–7.7)
Neutrophils Relative %: 51 % (ref 43.0–77.0)
Platelets: 160 10*3/uL (ref 150.0–400.0)
RBC: 3.93 Mil/uL — ABNORMAL LOW (ref 4.22–5.81)
RDW: 13.7 % (ref 11.5–15.5)
WBC: 5.3 10*3/uL (ref 4.0–10.5)

## 2021-10-11 LAB — HEPATIC FUNCTION PANEL
ALT: 14 U/L (ref 0–53)
AST: 20 U/L (ref 0–37)
Albumin: 4.7 g/dL (ref 3.5–5.2)
Alkaline Phosphatase: 68 U/L (ref 39–117)
Bilirubin, Direct: 0.2 mg/dL (ref 0.0–0.3)
Total Bilirubin: 0.8 mg/dL (ref 0.2–1.2)
Total Protein: 7.4 g/dL (ref 6.0–8.3)

## 2021-10-11 LAB — BASIC METABOLIC PANEL WITH GFR
BUN: 23 mg/dL (ref 6–23)
CO2: 29 meq/L (ref 19–32)
Calcium: 9.8 mg/dL (ref 8.4–10.5)
Chloride: 100 meq/L (ref 96–112)
Creatinine, Ser: 1.08 mg/dL (ref 0.40–1.50)
GFR: 63.33 mL/min
Glucose, Bld: 88 mg/dL (ref 70–99)
Potassium: 4.4 meq/L (ref 3.5–5.1)
Sodium: 138 meq/L (ref 135–145)

## 2021-10-11 LAB — LIPID PANEL
Cholesterol: 122 mg/dL (ref 0–200)
HDL: 39.6 mg/dL
LDL Cholesterol: 60 mg/dL (ref 0–99)
NonHDL: 82.79
Total CHOL/HDL Ratio: 3
Triglycerides: 112 mg/dL (ref 0.0–149.0)
VLDL: 22.4 mg/dL (ref 0.0–40.0)

## 2021-10-11 LAB — TSH: TSH: 2.46 u[IU]/mL (ref 0.35–5.50)

## 2021-10-11 LAB — FERRITIN: Ferritin: 175.3 ng/mL (ref 22.0–322.0)

## 2021-10-11 NOTE — Progress Notes (Signed)
Patient ID: Brett Riley, male   DOB: 01-19-1936, 86 y.o.   MRN: [id number]   Subjective:    Patient ID: Brett Riley, male    DOB: [birth year], 86 y.o.   MRN: [id number]   Patient here for physical exam.   Chief Complaint  Patient presents with   Follow-up    Yearly CPE   .   HPI Reports he is doing relatively well.  Some increased stress - working.  Owns his own business.  Some stress related to this.  Overall he feels he is handling things relatively well.  Tries to stay active.  No chest pain.  Breathing stable.  No increased cough or congestion.  No acid reflux.  No abdominal pain.  Bowels moving.  States blood pressures - averaging 138/70.  Taking tylenol arthritis - helping hip.  Does have lesion - top of head.  Has appt with dermatology (Dr Kellie Moor) - next month.     Past Medical History:  Diagnosis Date   Cancer [facility])    skin cancer basal   Carotid arterial disease ([place])    a. 02/2016 L CEA; b. 56/3875 U/S: patent LICA, 6-43% RICA.   Coronary artery disease    a. 01/2016 MV: mild apical/basal inferior, apical lateral, mid anterolateral, and mid inferolateral ischemia. EF 57%; b. 02/2016 Cath: LM 40ost, LAD 70p/m, 20d, D1 95 (small), LCX nl, RCA 73m RPDA 90 (small), EF 55-65%-->med Rx. Rec CABG for recurrent symptoms.   History of echocardiogram    a. 02/2016 Echo: EF 50-55%, no rwma, mild MR.   History of kidney stones    Hypercholesterolemia    Hypertension    Past Surgical History:  Procedure Laterality Date   CARDIAC CATHETERIZATION N/A 01/19/2016   Procedure: Left Heart Cath and Coronary Angiography;  Surgeon: MWellington Hampshire MD;  [facility];  Service: Cardiovascular;  Laterality: N/A;   COLONOSCOPY     CYSTOSCOPY/URETEROSCOPY/HOLMIUM LASER/STENT PLACEMENT Right 04/13/2021   Procedure: CYSTOSCOPY/URETEROSCOPY/HOLMIUM LASER/STENT PLACEMENT;  Surgeon: SBilley Co MD;  Location: [facility];  Service: Urology;  Laterality: Right;    ENDARTERECTOMY Left 02/15/2016   Procedure: ENDARTERECTOMY CAROTID;  Surgeon: JAlgernon Huxley MD;  Location: [facility] ORS;  Service: Vascular;  Laterality: Left;   Family History  Problem Relation Age of Onset   Heart disease Father        MI   Heart attack Father    Stroke Mother    Breast cancer Sister    Colon cancer Neg Hx    Prostate cancer Neg Hx    Social History   Socioeconomic History   Marital status: Married    Spouse name: Not on file   Number of children: 1   Years of education: Not on file   Highest education level: Not on file  Occupational History    Employer: [employer]  Tobacco Use   Smoking status: Never   Smokeless tobacco: Never  Vaping Use   Vaping Use: Never used  Substance and Sexual Activity   Alcohol use: No    Alcohol/week: 0.0 standard drinks of alcohol   Drug use: No   Sexual activity: Not Currently  Other Topics Concern   Not on file  Social History Narrative   Not on file   Social Determinants of Health   Financial Resource Strain: Low Risk  (08/29/2020)   Overall Financial Resource Strain (CARDIA)    Difficulty of Paying Living Expenses: Not hard at all  Food Insecurity:  No Food Insecurity (08/29/2020)   Hunger Vital Sign    Worried About Running Out of Food in the Last Year: Never true    Ran Out of Food in the Last Year: Never true  Transportation Needs: No Transportation Needs (08/29/2020)   PRAPARE - Hydrologist (Medical): No    Lack of Transportation (Non-Medical): No  Physical Activity: Insufficiently Active (08/29/2020)   Exercise Vital Sign    Days of Exercise per Week: 3 days    Minutes of Exercise per Session: 20 min  Stress: No Stress Concern Present (08/29/2020)   [place]    Feeling of Stress : Not at all  Social Connections: Unknown (08/29/2020)   Social Connection and Isolation Panel [NHANES]    Frequency of Communication with  Friends and Family: Not on file    Frequency of Social Gatherings with Friends and Family: Not on file    Attends Religious Services: Not on file    Active Member of Clubs or Organizations: Not on file    Attends Archivist Meetings: Not on file    Marital Status: Married     Review of Systems  Constitutional:  Negative for appetite change and unexpected weight change.  HENT:  Negative for congestion, sinus pressure and sore throat.   Eyes:  Negative for pain and visual disturbance.  Respiratory:  Negative for cough, chest tightness and shortness of breath.   Cardiovascular:  Negative for chest pain, palpitations and leg swelling.  Gastrointestinal:  Negative for abdominal pain, diarrhea, nausea and vomiting.  Genitourinary:  Negative for difficulty urinating and dysuria.  Musculoskeletal:  Negative for joint swelling and myalgias.  Skin:  Negative for color change and rash.  Neurological:  Negative for dizziness, light-headedness and headaches.  Hematological:  Negative for adenopathy. Does not bruise/bleed easily.  Psychiatric/Behavioral:  Negative for agitation and dysphoric mood.        Objective:     BP 134/70 (BP Location: Left Arm, Patient Position: Sitting, Cuff Size: Small)   Pulse 65   Temp (!) 97.5 F (36.4 C) (Temporal)   Resp 17   Ht 6' (1.829 m)   Wt 195 lb (88.5 kg)   SpO2 98%   BMI 26.45 kg/m  Wt Readings from Last 3 Encounters:  10/11/21 195 lb (88.5 kg)  05/10/21 190 lb 12.8 oz (86.5 kg)  04/10/21 195 lb 1.7 oz (88.5 kg)    Physical Exam Constitutional:      General: He is not in acute distress.    Appearance: He is well-developed.  HENT:     Head: Normocephalic and atraumatic.     Nose: Nose normal.     Mouth/Throat:     Pharynx: No oropharyngeal exudate.  Eyes:     General:        Right eye: No discharge.        Left eye: No discharge.     Conjunctiva/sclera: Conjunctivae normal.  Neck:     Thyroid: No thyromegaly.   Cardiovascular:     Rate and Rhythm: Normal rate and regular rhythm.  Pulmonary:     Effort: No respiratory distress.     Breath sounds: Normal breath sounds. No wheezing.  Abdominal:     General: Bowel sounds are normal.     Palpations: Abdomen is soft.     Tenderness: There is no abdominal tenderness.  Genitourinary:    Penis: Normal.  Rectum: Normal.  Musculoskeletal:        General: No swelling or tenderness.     Cervical back: Neck supple.  Lymphadenopathy:     Cervical: No cervical adenopathy.  Skin:    General: Skin is dry.     Findings: No rash.  Neurological:     Mental Status: He is alert and oriented to person, place, and time.  Psychiatric:        Behavior: Behavior normal.      Outpatient Encounter Medications as of 10/11/2021  Medication Sig   aspirin 81 MG tablet Take 81 mg by mouth at bedtime.    clopidogrel (PLAVIX) 75 MG tablet TAKE 1 TABLET BY MOUTH EVERY DAY WITH BREAKFAST   lisinopril (ZESTRIL) 40 MG tablet TAKE 1 TABLET BY MOUTH EVERY DAY   metoprolol succinate (TOPROL-XL) 100 MG 24 hr tablet TAKE 1 TABLET BY MOUTH EVERY DAY WITH A MEAL (Patient taking differently: 100 mg every morning. TAKE 1 TABLET BY MOUTH EVERY DAY WITH A MEAL)   Multiple Vitamins-Iron (MULTIVITAMINS WITH IRON) TABS Take 1 tablet by mouth daily.   rosuvastatin (CRESTOR) 10 MG tablet TAKE 1 TABLET BY MOUTH EVERY DAY   vitamin B-12 (CYANOCOBALAMIN) 1000 MCG tablet Take 1,000 mcg by mouth daily.   No facility-administered encounter medications on file as of 10/11/2021.     Lab Results  Component Value Date   WBC 5.3 10/11/2021   HGB 12.8 (L) 10/11/2021   HCT 38.1 (L) 10/11/2021   PLT 160.0 10/11/2021   GLUCOSE 88 10/11/2021   CHOL 122 10/11/2021   TRIG 112.0 10/11/2021   HDL 39.60 10/11/2021   LDLCALC 60 10/11/2021   ALT 14 10/11/2021   AST 20 10/11/2021   NA 138 10/11/2021   K 4.4 10/11/2021   CL 100 10/11/2021   CREATININE 1.08 10/11/2021   BUN 23 10/11/2021    CO2 29 10/11/2021   TSH 2.46 10/11/2021   PSA 0.80 12/08/2019   INR 1.08 02/13/2016    MR HIP LEFT W WO CONTRAST  Result Date: 05/27/2021 CLINICAL DATA:  Evaluate incidentally discovered left acetabular bone lesion EXAM: MRI OF THE LEFT HIP WITHOUT AND WITH CONTRAST TECHNIQUE: Multiplanar, multisequence MR imaging was performed both before and after administration of intravenous contrast. CONTRAST:  74m GADAVIST GADOBUTROL 1 MMOL/ML IV SOLN COMPARISON:  CT 04/09/2021 FINDINGS: Bones: Well-defined cystic lesion within the posterior left acetabulum measuring 3.3 x 2.3 x 2.8 cm (series 7, image 17). Lesion is T2 hyperintense with a few thin internal septations. Thin peripheral and septal enhancement on postcontrast sequences. Lesion is contiguous with the left hip joint space (series 11, images 19-21). No solid component. No marrow edema or other signal abnormality within the adjacent bone. 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 No acute fracture. No dislocation. No femoral head avascular necrosis. Bony pelvis intact without diastasis. SI joints and pubic symphysis within normal limits. Mild-moderate osteoarthritis of the right hip joint with mild subchondral marrow edema. Multilevel degenerative disc disease of the lower lumbar spine with associated discogenic endplate marrow changes. No extra-articular bone marrow edema. No suspicious marrow replacing bone lesion. Articular cartilage and labrum Articular cartilage: Diffuse cartilage thinning with areas of full-thickness cartilage loss involving the superior aspect of the femoral head and acetabulum. Prominent subchondral bone marrow edema within the femoral head superiorly. No well-defined subchondral fracture line. Labrum:  Degenerative fraying of the superior labrum. Joint or bursal effusion Joint effusion:  None. Bursae: No abnormal bursal fluid collection.  Muscles and tendons Muscles and tendons: Severe tendinosis of the right  gluteus minimus tendon. The left gluteal, hamstring, iliopsoas, rectus femoris, and adductor tendons appear intact without tear or significant tendinosis. Normal muscle bulk and signal intensity without edema, atrophy, or fatty infiltration. Other findings Miscellaneous: No soft tissue edema or fluid collection. No inguinal lymphadenopathy. No enhancing soft tissue mass. Mildly enlarged prostate gland. IMPRESSION: 1. Well-defined 3.3 cm cystic lesion within the posterior left acetabulum measuring up to 3.3 cm, contiguous with the left hip joint space. 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 No suspicious features are identified. 2. Moderate osteoarthritis of the bilateral hips, left worse than right. 3. Prominent subchondral bone marrow edema within the left femoral head superiorly. No well-defined subchondral fracture line is identified. 4. Severe tendinosis of the right gluteus minimus tendon. 5. Mildly enlarged prostate gland. Electronically Signed   By: Davina Poke D.O.   On: 05/27/2021 18:46       Assessment & Plan:   Problem List Items Addressed This Visit     Abnormal CT of the abdomen    Previous CT mentioned concern regarding lytic lesion if the left acetabulum.  See last note for details.  Had MRI Well-defined 3.3 cm cystic lesion within the posterior left acetabulum measuring up to 3.3 cm, contiguous with the left hip joint space. 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 No suspicious features are identified. Moderate osteoarthritis of the bilateral hips, left worse than right.  Prominent subchondral bone marrow edema within the left femoral head superiorly. No well-defined subchondral fracture line is identified. Severe tendinosis of the right gluteus minimus tendon.  Taking tylenol prn.  No significant pain.  Follow.       Anemia    Recheck cbc to confirm stable.  Check ferritin.       Relevant Orders   CBC w/Diff (Completed)    Ferritin (Completed)   Aneurysm artery, iliac common (HCC)    F/u [facility]      Aortic atherosclerosis ([place])    Continue crestor.        CAD (coronary artery disease)    Previous cath - 2 vessel disease.  Risk factor modification.  Continue crestor.       Carotid artery disease (HCC)    S/p left ICA CEA.  Followed by Dr Lucky Cowboy.  Continue risk factor modification.  Continue daily aspirin. Evaluated 07/2020 - his carotid duplex shows a patent left carotid endarterectomy without significant restenosis and 1 to 39% right ICA stenosis without progression from previous studies. Recommended f/u in 18 months.       Essential hypertension, benign    Blood pressure as outlined.  Continue - lisinopril '40mg'$  q day.  Follow pressures.  Follow metabolic panel.  Continue current dose of metoprolol.       Health care maintenance    Physical today 10/11/21.  colonoscopy 08/2013. Recommended f/u in 10 years.        Hyperlipidemia    On crestor.  Low cholesterol diet and exercise.  Follow lipid panel and liver function tests.        Relevant Orders   Lipid Profile (Completed)   Hepatic function panel (Completed)   TSH (Completed)   Hypertension   Relevant Orders   Basic Metabolic Panel (BMET) (Completed)   Scalp lesion    Scalp/head lesion.  Planning to see dermatology 11/2021.        Thrombocytopenia ([place])    Saw hematology.  Recommended  f/u 2-3x/year.  Follow cbc.       Other Visit Diagnoses     Routine general medical examination at a health care facility    -  Primary   Immunization counseling       Relevant Orders   Varicella zoster antibody, IgG (Completed)        Einar Pheasant, MD

## 2021-10-11 NOTE — Assessment & Plan Note (Addendum)
Physical today 10/11/21.  colonoscopy 08/2013. Recommended f/u in 10 years.

## 2021-10-12 LAB — VARICELLA ZOSTER ANTIBODY, IGG: Varicella IgG: 1130 {index}

## 2021-10-14 ENCOUNTER — Encounter: Payer: Self-pay | Admitting: Internal Medicine

## 2021-10-14 DIAGNOSIS — L989 Disorder of the skin and subcutaneous tissue, unspecified: Secondary | ICD-10-CM | POA: Insufficient documentation

## 2021-10-14 NOTE — Assessment & Plan Note (Signed)
Previous CT mentioned concern regarding lytic lesion if the left acetabulum.  See last note for details.  Had MRI Well-defined 3.3 cm cystic lesion within the posterior left acetabulum measuring up to 3.3 cm, contiguous with the left hip joint space. 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 No suspicious features are identified. Moderate osteoarthritis of the bilateral hips, left worse than right.  Prominent subchondral bone marrow edema within the left femoral head superiorly. No well-defined subchondral fracture line is identified. Severe tendinosis of the right gluteus minimus tendon.  Taking tylenol prn.  No significant pain.  Follow.

## 2021-10-14 NOTE — Assessment & Plan Note (Signed)
Recheck cbc to confirm stable.  Check ferritin.

## 2021-10-14 NOTE — Assessment & Plan Note (Signed)
Blood pressure as outlined.  Continue - lisinopril '40mg'$  q day.  Follow pressures.  Follow metabolic panel.  Continue current dose of metoprolol.

## 2021-10-14 NOTE — Assessment & Plan Note (Signed)
Scalp/head lesion.  Planning to see dermatology 11/2021.

## 2021-10-14 NOTE — Assessment & Plan Note (Signed)
Continue crestor 

## 2021-10-14 NOTE — Assessment & Plan Note (Signed)
Previous cath - 2 vessel disease.  Risk factor modification.  Continue crestor.  

## 2021-10-14 NOTE — Assessment & Plan Note (Signed)
On crestor.  Low cholesterol diet and exercise.  Follow lipid panel and liver function tests.   

## 2021-10-14 NOTE — Assessment & Plan Note (Signed)
Saw hematology.  Recommended f/u 2-3x/year.  Follow cbc.  

## 2021-10-14 NOTE — Assessment & Plan Note (Signed)
S/p left ICA CEA.  Followed by Dr Lucky Cowboy.  Continue risk factor modification.  Continue daily aspirin. Evaluated 07/2020 - his carotid duplex shows a patent left carotid endarterectomy without significant restenosis and 1 to 39% right ICA stenosis without progression from previous studies. Recommended f/u in 18 months.

## 2021-10-14 NOTE — Assessment & Plan Note (Signed)
F/u [facility]

## 2021-10-19 ENCOUNTER — Encounter: Payer: Medicare HMO | Admitting: Internal Medicine

## 2021-11-07 DIAGNOSIS — D0439 Carcinoma in situ of skin of other parts of face: Secondary | ICD-10-CM | POA: Diagnosis not present

## 2021-11-07 DIAGNOSIS — L821 Other seborrheic keratosis: Secondary | ICD-10-CM | POA: Diagnosis not present

## 2021-11-07 DIAGNOSIS — D044 Carcinoma in situ of skin of scalp and neck: Secondary | ICD-10-CM | POA: Diagnosis not present

## 2021-11-07 DIAGNOSIS — D485 Neoplasm of uncertain behavior of skin: Secondary | ICD-10-CM | POA: Diagnosis not present

## 2021-11-07 DIAGNOSIS — L570 Actinic keratosis: Secondary | ICD-10-CM | POA: Diagnosis not present

## 2021-11-07 DIAGNOSIS — D2271 Melanocytic nevi of right lower limb, including hip: Secondary | ICD-10-CM | POA: Diagnosis not present

## 2021-11-07 DIAGNOSIS — D225 Melanocytic nevi of trunk: Secondary | ICD-10-CM | POA: Diagnosis not present

## 2021-11-07 DIAGNOSIS — D2262 Melanocytic nevi of left upper limb, including shoulder: Secondary | ICD-10-CM | POA: Diagnosis not present

## 2021-11-07 DIAGNOSIS — D2261 Melanocytic nevi of right upper limb, including shoulder: Secondary | ICD-10-CM | POA: Diagnosis not present

## 2021-11-07 DIAGNOSIS — X32XXXA Exposure to sunlight, initial encounter: Secondary | ICD-10-CM | POA: Diagnosis not present

## 2021-11-07 DIAGNOSIS — D2272 Melanocytic nevi of left lower limb, including hip: Secondary | ICD-10-CM | POA: Diagnosis not present

## 2021-11-21 ENCOUNTER — Other Ambulatory Visit: Payer: Self-pay | Admitting: Internal Medicine

## 2021-12-17 DIAGNOSIS — D044 Carcinoma in situ of skin of scalp and neck: Secondary | ICD-10-CM | POA: Diagnosis not present

## 2022-01-11 ENCOUNTER — Other Ambulatory Visit (INDEPENDENT_AMBULATORY_CARE_PROVIDER_SITE_OTHER): Payer: Self-pay | Admitting: Vascular Surgery

## 2022-01-11 DIAGNOSIS — I723 Aneurysm of iliac artery: Secondary | ICD-10-CM

## 2022-01-18 ENCOUNTER — Ambulatory Visit (INDEPENDENT_AMBULATORY_CARE_PROVIDER_SITE_OTHER): Payer: Medicare HMO | Admitting: Vascular Surgery

## 2022-01-18 ENCOUNTER — Ambulatory Visit (INDEPENDENT_AMBULATORY_CARE_PROVIDER_SITE_OTHER): Payer: Medicare HMO

## 2022-01-18 ENCOUNTER — Encounter (INDEPENDENT_AMBULATORY_CARE_PROVIDER_SITE_OTHER): Payer: Self-pay | Admitting: Vascular Surgery

## 2022-01-18 VITALS — BP 150/72 | HR 56 | Resp 19 | Ht 72.0 in | Wt 201.8 lb

## 2022-01-18 DIAGNOSIS — I6523 Occlusion and stenosis of bilateral carotid arteries: Secondary | ICD-10-CM

## 2022-01-18 DIAGNOSIS — I10 Essential (primary) hypertension: Secondary | ICD-10-CM | POA: Diagnosis not present

## 2022-01-18 DIAGNOSIS — E785 Hyperlipidemia, unspecified: Secondary | ICD-10-CM

## 2022-01-18 DIAGNOSIS — I723 Aneurysm of iliac artery: Secondary | ICD-10-CM

## 2022-01-18 NOTE — Assessment & Plan Note (Signed)
Duplex today did not show the aneurysmal degeneration to be over 1.5 cm in either iliac artery as it has previously, but this can be difficult to evaluate sometimes in the pelvis.  There certainly does not appear to be any growth. At this point, I think we can check this every other year with duplex.

## 2022-01-18 NOTE — Assessment & Plan Note (Signed)
blood pressure control important in reducing the progression of atherosclerotic disease and aneurysmal growth. On appropriate oral medications.  

## 2022-01-18 NOTE — Assessment & Plan Note (Signed)
His carotid duplex today shows stable 1 to 39% right ICA stenosis with some progression of his left ICA stenosis now into the 40 to 59% range. Continue current medical regimen.  No role for intervention.  On aspirin, Plavix, and Crestor.  We will shorten his follow-up to 12 months at this point.

## 2022-01-18 NOTE — Progress Notes (Signed)
MRN : [id number]  Brett Riley is a 86 y.o. ([birth year]) male who presents with chief complaint of No chief complaint on file. Marland Kitchen  History of Present Illness: Patient returns today in follow up of multiple vascular issues.  He is doing well today.  He has no specific complaints.  He denies any focal neurologic symptoms. Specifically, the patient denies amaurosis fugax, speech or swallowing difficulties, or arm or leg weakness or numbness.  His carotid duplex today shows stable 1 to 39% right ICA stenosis with some progression of his left ICA stenosis now just into the 40 to 59% range. We have also followed him for small iliac artery aneurysms.  He denies any aneurysm related symptoms. Specifically, the patient denies new back or abdominal pain, or signs of peripheral embolization.  Duplex today did not show the aneurysmal degeneration to be over 1.5 cm in either iliac artery as it has previously, but this can be difficult to evaluate sometimes in the pelvis.  There certainly does not appear to be any growth.  Current Outpatient Medications  Medication Sig Dispense Refill   aspirin 81 MG tablet Take 81 mg by mouth at bedtime.      clopidogrel (PLAVIX) 75 MG tablet TAKE 1 TABLET BY MOUTH EVERY DAY WITH BREAKFAST 90 tablet 1   lisinopril (ZESTRIL) 40 MG tablet TAKE 1 TABLET BY MOUTH EVERY DAY 90 tablet 1   metoprolol succinate (TOPROL-XL) 100 MG 24 hr tablet TAKE 1 TABLET BY MOUTH EVERY DAY WITH A MEAL 90 tablet 2   Multiple Vitamins-Iron (MULTIVITAMINS WITH IRON) TABS Take 1 tablet by mouth daily.     rosuvastatin (CRESTOR) 10 MG tablet TAKE 1 TABLET BY MOUTH EVERY DAY 90 tablet 3   vitamin B-12 (CYANOCOBALAMIN) 1000 MCG tablet Take 1,000 mcg by mouth daily.     No current facility-administered medications for this visit.    Past Medical History:  Diagnosis Date   Cancer [facility])    skin cancer basal   Carotid arterial disease ([place])    a. 02/2016 L CEA; b. 31/5176 U/S: patent LICA, 1-60%  RICA.   Coronary artery disease    a. 01/2016 MV: mild apical/basal inferior, apical lateral, mid anterolateral, and mid inferolateral ischemia. EF 57%; b. 02/2016 Cath: LM 40ost, LAD 70p/m, 20d, D1 95 (small), LCX nl, RCA 32m RPDA 90 (small), EF 55-65%-->med Rx. Rec CABG for recurrent symptoms.   History of echocardiogram    a. 02/2016 Echo: EF 50-55%, no rwma, mild MR.   History of kidney stones    Hypercholesterolemia    Hypertension     Past Surgical History:  Procedure Laterality Date   CARDIAC CATHETERIZATION N/A 01/19/2016   Procedure: Left Heart Cath and Coronary Angiography;  Surgeon: MWellington Hampshire MD;  [facility];  Service: Cardiovascular;  Laterality: N/A;   COLONOSCOPY     CYSTOSCOPY/URETEROSCOPY/HOLMIUM LASER/STENT PLACEMENT Right 04/13/2021   Procedure: CYSTOSCOPY/URETEROSCOPY/HOLMIUM LASER/STENT PLACEMENT;  Surgeon: SBilley Co MD;  Location: [facility];  Service: Urology;  Laterality: Right;   ENDARTERECTOMY Left 02/15/2016   Procedure: ENDARTERECTOMY CAROTID;  Surgeon: JAlgernon Huxley MD;  Location: [facility];  Service: Vascular;  Laterality: Left;     Social History   Tobacco Use   Smoking status: Never   Smokeless tobacco: Never  Vaping Use   Vaping Use: Never used  Substance Use Topics   Alcohol use: No    Alcohol/week: 0.0 standard drinks of alcohol   Drug use: No  Family History  Problem Relation Age of Onset   Heart disease Father        MI   Heart attack Father    Stroke Mother    Breast cancer Sister    Colon cancer Neg Hx    Prostate cancer Neg Hx      No Known Allergies   REVIEW OF SYSTEMS (Negative unless checked)  Constitutional: '[]'$ Weight loss  '[]'$ Fever  '[]'$ Chills Cardiac: '[]'$ Chest pain   '[]'$ Chest pressure   '[]'$ Palpitations   '[]'$ Shortness of breath when laying flat   '[]'$ Shortness of breath at rest   '[]'$ Shortness of breath with exertion. Vascular:  '[]'$ Pain in legs with walking   '[]'$ Pain in legs at rest   '[]'$ Pain in  legs when laying flat   '[]'$ Claudication   '[]'$ Pain in feet when walking  '[]'$ Pain in feet at rest  '[]'$ Pain in feet when laying flat   '[]'$ History of DVT   '[]'$ Phlebitis   '[]'$ Swelling in legs   '[]'$ Varicose veins   '[]'$ Non-healing ulcers Pulmonary:   '[]'$ Uses home oxygen   '[]'$ Productive cough   '[]'$ Hemoptysis   '[]'$ Wheeze  '[]'$ COPD   '[]'$ Asthma Neurologic:  '[]'$ Dizziness  '[]'$ Blackouts   '[]'$ Seizures   '[]'$ History of stroke   '[]'$ History of TIA  '[]'$ Aphasia   '[]'$ Temporary blindness   '[]'$ Dysphagia   '[]'$ Weakness or numbness in arms   '[]'$ Weakness or numbness in legs Musculoskeletal:  '[x]'$ Arthritis   '[]'$ Joint swelling   '[x]'$ Joint pain   '[]'$ Low back pain Hematologic:  '[]'$ Easy bruising  '[]'$ Easy bleeding   '[]'$ Hypercoagulable state   '[]'$ Anemic   Gastrointestinal:  '[]'$ Blood in stool   '[]'$ Vomiting blood  '[]'$ Gastroesophageal reflux/heartburn   '[]'$ Abdominal pain Genitourinary:  '[]'$ Chronic kidney disease   '[]'$ Difficult urination  '[]'$ Frequent urination  '[]'$ Burning with urination   '[]'$ Hematuria Skin:  '[]'$ Rashes   '[]'$ Ulcers   '[]'$ Wounds Psychological:  '[]'$ History of anxiety   '[]'$  History of major depression.  Physical Examination  BP (!) 150/72 (BP Location: Right Arm)   Pulse (!) 56   Resp 19   Ht 6' (1.829 m)   Wt 201 lb 12.8 oz (91.5 kg)   BMI 27.37 kg/m  Gen:  WD/WN, NAD. Appears younger than stated age. Head: [place]/AT, No temporalis wasting. Ear/Nose/Throat: Hearing grossly intact, nares w/o erythema or drainage Eyes: Conjunctiva clear. Sclera non-icteric Neck: Supple.  Trachea midline Pulmonary:  Good air movement, no use of accessory muscles.  Cardiac: RRR, no JVD Vascular:  Vessel Right Left  Radial Palpable Palpable                          PT Palpable Palpable  DP Palpable Palpable   Gastrointestinal: soft, non-tender/non-distended. No guarding/reflex.  Musculoskeletal: M/S 5/5 throughout.  No deformity or atrophy. no edema. Neurologic: Sensation grossly intact in extremities.  Symmetrical.  Speech is fluent.  Psychiatric: Judgment intact, Mood &  affect appropriate for pt's clinical situation. Dermatologic: No rashes or ulcers noted.  No cellulitis or open wounds.      Labs No results found for this or any previous visit (from the past 2160 hour(s)).  Radiology No results found.  Assessment/Plan  Carotid artery disease (HCC) His carotid duplex today shows stable 1 to 39% right ICA stenosis with some progression of his left ICA stenosis now into the 40 to 59% range. Continue current medical regimen.  No role for intervention.  On aspirin, Plavix, and Crestor.  We will shorten his follow-up to 12 months at this point.  Aneurysm artery, iliac  common ([place])  Duplex today did not show the aneurysmal degeneration to be over 1.5 cm in either iliac artery as it has previously, but this can be difficult to evaluate sometimes in the pelvis.  There certainly does not appear to be any growth. At this point, I think we can check this every other year with duplex.  Essential hypertension, benign blood pressure control important in reducing the progression of atherosclerotic disease and aneurysmal growth. On appropriate oral medications.   Hyperlipidemia lipid control important in reducing the progression of atherosclerotic disease. Continue statin therapy    Leotis Pain, MD  01/18/2022 5:46 PM    This note was created with Dragon medical transcription system.  Any errors from dictation are purely unintentional

## 2022-01-18 NOTE — Assessment & Plan Note (Signed)
lipid control important in reducing the progression of atherosclerotic disease. Continue statin therapy  

## 2022-01-28 ENCOUNTER — Telehealth (INDEPENDENT_AMBULATORY_CARE_PROVIDER_SITE_OTHER): Payer: Self-pay | Admitting: Vascular Surgery

## 2022-01-28 NOTE — Telephone Encounter (Signed)
LVM for pt TCB and let us know how we can help. Pt's wife LVMOM asking for [place] to call and answer questions about the scheduled appts.

## 2022-02-14 ENCOUNTER — Ambulatory Visit (INDEPENDENT_AMBULATORY_CARE_PROVIDER_SITE_OTHER): Payer: Medicare HMO | Admitting: Internal Medicine

## 2022-02-14 ENCOUNTER — Telehealth: Payer: Self-pay

## 2022-02-14 ENCOUNTER — Encounter: Payer: Self-pay | Admitting: Internal Medicine

## 2022-02-14 VITALS — BP 142/80 | HR 67 | Temp 98.3°F | Resp 15 | Ht 72.0 in | Wt 201.6 lb

## 2022-02-14 DIAGNOSIS — D649 Anemia, unspecified: Secondary | ICD-10-CM

## 2022-02-14 DIAGNOSIS — I10 Essential (primary) hypertension: Secondary | ICD-10-CM | POA: Diagnosis not present

## 2022-02-14 DIAGNOSIS — E785 Hyperlipidemia, unspecified: Secondary | ICD-10-CM | POA: Diagnosis not present

## 2022-02-14 DIAGNOSIS — I2510 Atherosclerotic heart disease of native coronary artery without angina pectoris: Secondary | ICD-10-CM | POA: Diagnosis not present

## 2022-02-14 DIAGNOSIS — D696 Thrombocytopenia, unspecified: Secondary | ICD-10-CM | POA: Diagnosis not present

## 2022-02-14 DIAGNOSIS — I700 Atherosclerosis of aorta: Secondary | ICD-10-CM

## 2022-02-14 DIAGNOSIS — Z23 Encounter for immunization: Secondary | ICD-10-CM | POA: Diagnosis not present

## 2022-02-14 DIAGNOSIS — I6523 Occlusion and stenosis of bilateral carotid arteries: Secondary | ICD-10-CM

## 2022-02-14 DIAGNOSIS — I723 Aneurysm of iliac artery: Secondary | ICD-10-CM

## 2022-02-14 LAB — HEPATIC FUNCTION PANEL
ALT: 17 U/L (ref 0–53)
AST: 21 U/L (ref 0–37)
Albumin: 4.9 g/dL (ref 3.5–5.2)
Alkaline Phosphatase: 69 U/L (ref 39–117)
Bilirubin, Direct: 0.2 mg/dL (ref 0.0–0.3)
Total Bilirubin: 0.8 mg/dL (ref 0.2–1.2)
Total Protein: 7.7 g/dL (ref 6.0–8.3)

## 2022-02-14 LAB — BASIC METABOLIC PANEL WITH GFR
BUN: 20 mg/dL (ref 6–23)
CO2: 31 meq/L (ref 19–32)
Calcium: 10 mg/dL (ref 8.4–10.5)
Chloride: 101 meq/L (ref 96–112)
Creatinine, Ser: 1.05 mg/dL (ref 0.40–1.50)
GFR: 65.35 mL/min
Glucose, Bld: 98 mg/dL (ref 70–99)
Potassium: 4.3 meq/L (ref 3.5–5.1)
Sodium: 138 meq/L (ref 135–145)

## 2022-02-14 LAB — CBC WITH DIFFERENTIAL/PLATELET
Basophils Absolute: 0 10*3/uL (ref 0.0–0.1)
Basophils Relative: 0.5 % (ref 0.0–3.0)
Eosinophils Absolute: 0.2 10*3/uL (ref 0.0–0.7)
Eosinophils Relative: 4.1 % (ref 0.0–5.0)
HCT: 41.5 % (ref 39.0–52.0)
Hemoglobin: 14.1 g/dL (ref 13.0–17.0)
Lymphocytes Relative: 41.3 % (ref 12.0–46.0)
Lymphs Abs: 2.1 10*3/uL (ref 0.7–4.0)
MCHC: 34 g/dL (ref 30.0–36.0)
MCV: 98.3 fl (ref 78.0–100.0)
Monocytes Absolute: 0.4 10*3/uL (ref 0.1–1.0)
Monocytes Relative: 8.5 % (ref 3.0–12.0)
Neutro Abs: 2.3 10*3/uL (ref 1.4–7.7)
Neutrophils Relative %: 45.6 % (ref 43.0–77.0)
Platelets: 167 10*3/uL (ref 150.0–400.0)
RBC: 4.22 Mil/uL (ref 4.22–5.81)
RDW: 13.6 % (ref 11.5–15.5)
WBC: 5.1 10*3/uL (ref 4.0–10.5)

## 2022-02-14 LAB — LIPID PANEL
Cholesterol: 131 mg/dL (ref 0–200)
HDL: 44.2 mg/dL
LDL Cholesterol: 64 mg/dL (ref 0–99)
NonHDL: 87
Total CHOL/HDL Ratio: 3
Triglycerides: 113 mg/dL (ref 0.0–149.0)
VLDL: 22.6 mg/dL (ref 0.0–40.0)

## 2022-02-14 MED ORDER — CLOPIDOGREL BISULFATE 75 MG PO TABS: ORAL_TABLET | ORAL | 1 refills | Status: DC

## 2022-02-14 MED ORDER — AMLODIPINE BESYLATE 2.5 MG PO TABS: 2.5000 mg | ORAL_TABLET | Freq: Every day | ORAL | 2 refills | Status: DC

## 2022-02-14 MED ORDER — LISINOPRIL 40 MG PO TABS: 40.0000 mg | ORAL_TABLET | Freq: Every day | ORAL | 1 refills | Status: DC

## 2022-02-14 MED ORDER — ROSUVASTATIN CALCIUM 10 MG PO TABS: 10.0000 mg | ORAL_TABLET | Freq: Every day | ORAL | 3 refills | Status: DC

## 2022-02-14 NOTE — Telephone Encounter (Signed)
Caller name:   Relationship to patient: Can be reached: Pharmacy: Last visit:   Situation: (What circumstance or reason brought patient into the office?)    Symptoms:    '@LASTVISITSECTIONDTEXT'$ @    Attributing factors (medication changes, positional changes, etc. )      Duration :   Pain Scale?  On 1-10 how woiuld you rate your pain? What makes it better or worse?    Blood pressure            Pulse             Temp           Dizziness : Orthostatic BP: Lying           Sitting         Standing      Urinary Symptoms POCT UA. Normal           Abnormal  Palpitations : Chest pain:  Increased Pulse : Get ECG . ( Make PCP aware first patient having chest pain).

## 2022-02-14 NOTE — Progress Notes (Signed)
Patient ID: Brett Riley, male   DOB: 01-05-1936, 86 y.o.   MRN: [id number]   Subjective:    Patient ID: Brett Riley, male    DOB: 1935-09-26, 86 y.o.   MRN: [id number]   Patient here for scheduled follow up.   Chief Complaint  Patient presents with   Medical Management of Chronic Issues   Hypertension   .   HPI Here to follow up regarding his blood pressure and cholesterol.  Reports he is doing relatively well.  Some increased stress - working.  Owns his own business.  Some stress related to this.  Overall he feels he is handling things relatively well.  Desires no further intervention at this time. Tries to stay active.  No chest pain.  Breathing stable.  No increased cough or congestion.  No acid reflux.  No abdominal pain.  Bowels moving.  Taking tylenol arthritis - helping hip.  Lesion - top of head.  Had appt with dermatology (Dr Kellie Moor) - lesions removed and doing well. Has f/u with Dr Kellie Moor 06/2022. Saw Dr Lucky Cowboy 01/18/22 -  His carotid duplex today shows stable 1 to 39% right ICA stenosis with some progression of his left ICA stenosis now just into the 40 to 59% range.  Recommended continuing aspirin, plavix and crestor.  F/u 12 months.    Past Medical History:  Diagnosis Date   Cancer [facility])    skin cancer basal   Carotid arterial disease ([place])    a. 02/2016 L CEA; b. 59/1638 U/S: patent LICA, 4-66% RICA.   Coronary artery disease    a. 01/2016 MV: mild apical/basal inferior, apical lateral, mid anterolateral, and mid inferolateral ischemia. EF 57%; b. 02/2016 Cath: LM 40ost, LAD 70p/m, 20d, D1 95 (small), LCX nl, RCA 70m RPDA 90 (small), EF 55-65%-->med Rx. Rec CABG for recurrent symptoms.   History of echocardiogram    a. 02/2016 Echo: EF 50-55%, no rwma, mild MR.   History of kidney stones    Hypercholesterolemia    Hypertension    Past Surgical History:  Procedure Laterality Date   CARDIAC CATHETERIZATION N/A 01/19/2016   Procedure: Left Heart Cath and Coronary  Angiography;  Surgeon: MWellington Hampshire MD;  [facility];  Service: Cardiovascular;  Laterality: N/A;   COLONOSCOPY     CYSTOSCOPY/URETEROSCOPY/HOLMIUM LASER/STENT PLACEMENT Right 04/13/2021   Procedure: CYSTOSCOPY/URETEROSCOPY/HOLMIUM LASER/STENT PLACEMENT;  Surgeon: SBilley Co MD;  Location: [facility];  Service: Urology;  Laterality: Right;   ENDARTERECTOMY Left 02/15/2016   Procedure: ENDARTERECTOMY CAROTID;  Surgeon: JAlgernon Huxley MD;  Location: [facility];  Service: Vascular;  Laterality: Left;   Family History  Problem Relation Age of Onset   Heart disease Father        MI   Heart attack Father    Stroke Mother    Breast cancer Sister    Colon cancer Neg Hx    Prostate cancer Neg Hx    Social History   Socioeconomic History   Marital status: Married    Spouse name: Not on file   Number of children: 1   Years of education: Not on file   Highest education level: Not on file  Occupational History    Employer: [employer]  Tobacco Use   Smoking status: Never   Smokeless tobacco: Never  Vaping Use   Vaping Use: Never used  Substance and Sexual Activity   Alcohol use: No    Alcohol/week: 0.0 standard drinks of alcohol  Drug use: No   Sexual activity: Not Currently  Other Topics Concern   Not on file  Social History Narrative   Not on file   Social Determinants of Health   Financial Resource Strain: Low Risk  (08/29/2020)   Overall Financial Resource Strain (CARDIA)    Difficulty of Paying Living Expenses: Not hard at all  Food Insecurity: No Food Insecurity (08/29/2020)   Hunger Vital Sign    Worried About Running Out of Food in the Last Year: Never true    Ran Out of Food in the Last Year: Never true  Transportation Needs: No Transportation Needs (08/29/2020)   PRAPARE - Hydrologist (Medical): No    Lack of Transportation (Non-Medical): No  Physical Activity: Insufficiently Active (08/29/2020)   Exercise Vital  Sign    Days of Exercise per Week: 3 days    Minutes of Exercise per Session: 20 min  Stress: No Stress Concern Present (08/29/2020)   [place]    Feeling of Stress : Not at all  Social Connections: Unknown (08/29/2020)   Social Connection and Isolation Panel [NHANES]    Frequency of Communication with Friends and Family: Not on file    Frequency of Social Gatherings with Friends and Family: Not on file    Attends Religious Services: Not on file    Active Member of Clubs or Organizations: Not on file    Attends Archivist Meetings: Not on file    Marital Status: Married     Review of Systems  Constitutional:  Negative for appetite change and unexpected weight change.  HENT:  Negative for congestion, sinus pressure and sore throat.   Eyes:  Negative for pain and visual disturbance.  Respiratory:  Negative for cough, chest tightness and shortness of breath.   Cardiovascular:  Negative for chest pain, palpitations and leg swelling.  Gastrointestinal:  Negative for abdominal pain, diarrhea, nausea and vomiting.  Genitourinary:  Negative for difficulty urinating and dysuria.  Musculoskeletal:  Negative for joint swelling and myalgias.  Skin:  Negative for color change and rash.  Neurological:  Negative for dizziness, light-headedness and headaches.  Hematological:  Negative for adenopathy. Does not bruise/bleed easily.  Psychiatric/Behavioral:  Negative for agitation and dysphoric mood.        Objective:     BP (!) 142/80   Pulse 67   Temp 98.3 F (36.8 C) (Temporal)   Resp 15   Ht 6' (1.829 m)   Wt 201 lb 9.6 oz (91.4 kg)   SpO2 99%   BMI 27.34 kg/m  Wt Readings from Last 3 Encounters:  02/14/22 201 lb 9.6 oz (91.4 kg)  01/18/22 201 lb 12.8 oz (91.5 kg)  10/11/21 195 lb (88.5 kg)    Physical Exam Constitutional:      General: He is not in acute distress.    Appearance: He is well-developed.   HENT:     Head: Normocephalic and atraumatic.     Nose: Nose normal.     Mouth/Throat:     Pharynx: No oropharyngeal exudate.  Eyes:     General:        Right eye: No discharge.        Left eye: No discharge.     Conjunctiva/sclera: Conjunctivae normal.  Neck:     Thyroid: No thyromegaly.  Cardiovascular:     Rate and Rhythm: Normal rate and regular rhythm.  Pulmonary:  Effort: No respiratory distress.     Breath sounds: Normal breath sounds. No wheezing.  Abdominal:     General: Bowel sounds are normal.     Palpations: Abdomen is soft.     Tenderness: There is no abdominal tenderness.  Genitourinary:    Penis: Normal.      Rectum: Normal.  Musculoskeletal:        General: No swelling or tenderness.     Cervical back: Neck supple.  Lymphadenopathy:     Cervical: No cervical adenopathy.  Skin:    General: Skin is dry.     Findings: No rash.  Neurological:     Mental Status: He is alert and oriented to person, place, and time.  Psychiatric:        Behavior: Behavior normal.      Outpatient Encounter Medications as of 02/14/2022  Medication Sig   amLODipine (NORVASC) 2.5 MG tablet Take 1 tablet (2.5 mg total) by mouth daily.   aspirin 81 MG tablet Take 81 mg by mouth at bedtime.    metoprolol succinate (TOPROL-XL) 100 MG 24 hr tablet TAKE 1 TABLET BY MOUTH EVERY DAY WITH A MEAL   Multiple Vitamins-Iron (MULTIVITAMINS WITH IRON) TABS Take 1 tablet by mouth daily.   vitamin B-12 (CYANOCOBALAMIN) 1000 MCG tablet Take 1,000 mcg by mouth daily.   clopidogrel (PLAVIX) 75 MG tablet TAKE 1 TABLET BY MOUTH EVERY DAY WITH BREAKFAST   lisinopril (ZESTRIL) 40 MG tablet Take 1 tablet (40 mg total) by mouth daily.   rosuvastatin (CRESTOR) 10 MG tablet Take 1 tablet (10 mg total) by mouth daily.   [DISCONTINUED] clopidogrel (PLAVIX) 75 MG tablet TAKE 1 TABLET BY MOUTH EVERY DAY WITH BREAKFAST   [DISCONTINUED] lisinopril (ZESTRIL) 40 MG tablet TAKE 1 TABLET BY MOUTH EVERY DAY    [DISCONTINUED] rosuvastatin (CRESTOR) 10 MG tablet TAKE 1 TABLET BY MOUTH EVERY DAY   No facility-administered encounter medications on file as of 02/14/2022.     Lab Results  Component Value Date   WBC 5.1 02/14/2022   HGB 14.1 02/14/2022   HCT 41.5 02/14/2022   PLT 167.0 02/14/2022   GLUCOSE 98 02/14/2022   CHOL 131 02/14/2022   TRIG 113.0 02/14/2022   HDL 44.20 02/14/2022   LDLCALC 64 02/14/2022   ALT 17 02/14/2022   AST 21 02/14/2022   NA 138 02/14/2022   K 4.3 02/14/2022   CL 101 02/14/2022   CREATININE 1.05 02/14/2022   BUN 20 02/14/2022   CO2 31 02/14/2022   TSH 2.46 10/11/2021   PSA 0.80 12/08/2019   INR 1.08 02/13/2016    MR HIP LEFT W WO CONTRAST  Result Date: 05/27/2021 CLINICAL DATA:  Evaluate incidentally discovered left acetabular bone lesion EXAM: MRI OF THE LEFT HIP WITHOUT AND WITH CONTRAST TECHNIQUE: Multiplanar, multisequence MR imaging was performed both before and after administration of intravenous contrast. CONTRAST:  88m GADAVIST GADOBUTROL 1 MMOL/ML IV SOLN COMPARISON:  CT 04/09/2021 FINDINGS: Bones: Well-defined cystic lesion within the posterior left acetabulum measuring 3.3 x 2.3 x 2.8 cm (series 7, image 17). Lesion is T2 hyperintense with a few thin internal septations. Thin peripheral and septal enhancement on postcontrast sequences. Lesion is contiguous with the left hip joint space (series 11, images 19-21). No solid component. No marrow edema or other signal abnormality within the adjacent bone. 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 No acute fracture. No dislocation. No femoral head avascular necrosis. Bony pelvis intact without diastasis. SI joints and pubic  symphysis within normal limits. Mild-moderate osteoarthritis of the right hip joint with mild subchondral marrow edema. Multilevel degenerative disc disease of the lower lumbar spine with associated discogenic endplate marrow changes. No extra-articular bone marrow edema.  No suspicious marrow replacing bone lesion. Articular cartilage and labrum Articular cartilage: Diffuse cartilage thinning with areas of full-thickness cartilage loss involving the superior aspect of the femoral head and acetabulum. Prominent subchondral bone marrow edema within the femoral head superiorly. No well-defined subchondral fracture line. Labrum:  Degenerative fraying of the superior labrum. Joint or bursal effusion Joint effusion:  None. Bursae: No abnormal bursal fluid collection. Muscles and tendons Muscles and tendons: Severe tendinosis of the right gluteus minimus tendon. The left gluteal, hamstring, iliopsoas, rectus femoris, and adductor tendons appear intact without tear or significant tendinosis. Normal muscle bulk and signal intensity without edema, atrophy, or fatty infiltration. Other findings Miscellaneous: No soft tissue edema or fluid collection. No inguinal lymphadenopathy. No enhancing soft tissue mass. Mildly enlarged prostate gland. IMPRESSION: 1. Well-defined 3.3 cm cystic lesion within the posterior left acetabulum measuring up to 3.3 cm, contiguous with the left hip joint space. 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 No suspicious features are identified. 2. Moderate osteoarthritis of the bilateral hips, left worse than right. 3. Prominent subchondral bone marrow edema within the left femoral head superiorly. No well-defined subchondral fracture line is identified. 4. Severe tendinosis of the right gluteus minimus tendon. 5. Mildly enlarged prostate gland. Electronically Signed   By: Davina Poke D.O.   On: 05/27/2021 18:46       Assessment & Plan:   Problem List Items Addressed This Visit     Anemia - Primary    Follow cbc.       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       Relevant Medications   lisinopril (ZESTRIL) 40 MG tablet    rosuvastatin (CRESTOR) 10 MG tablet   amLODipine (NORVASC) 2.5 MG tablet   Aortic atherosclerosis ([facility])    Continue crestor.        Relevant Medications   lisinopril (ZESTRIL) 40 MG tablet   rosuvastatin (CRESTOR) 10 MG tablet   amLODipine (NORVASC) 2.5 MG tablet   CAD (coronary artery disease)    Previous cath - 2 vessel disease.  Risk factor modification.  Continue crestor.       Relevant Medications   lisinopril (ZESTRIL) 40 MG tablet   rosuvastatin (CRESTOR) 10 MG tablet   amLODipine (NORVASC) 2.5 MG tablet   Carotid artery disease ([place])    F/u 01/18/22 - carotid duplex today shows stable 1 to 39% right ICA stenosis with some progression of his left ICA stenosis now into the 40 to 59% range. Continue aspirin, Plavix, and Crestor. Follow-up - 12 months.       Relevant Medications   lisinopril (ZESTRIL) 40 MG tablet   rosuvastatin (CRESTOR) 10 MG tablet   amLODipine (NORVASC) 2.5 MG tablet   Essential hypertension, benign    Blood pressure as outlined.  Continue - lisinopril '40mg'$  q day.  Add amlodipine 2.'5mg'$  q day. Follow pressures.  Follow metabolic panel.  Continue current dose of metoprolol.       Relevant Medications   lisinopril (ZESTRIL) 40 MG tablet   rosuvastatin (CRESTOR) 10 MG tablet   amLODipine (NORVASC) 2.5 MG tablet   Hyperlipidemia    On crestor.  Low  cholesterol diet and exercise.  Follow lipid panel and liver function tests.        Relevant Medications   lisinopril (ZESTRIL) 40 MG tablet   rosuvastatin (CRESTOR) 10 MG tablet   amLODipine (NORVASC) 2.5 MG tablet   Other Relevant Orders   Lipid Profile (Completed)   Hepatic function panel (Completed)   Hypertension   Relevant Medications   lisinopril (ZESTRIL) 40 MG tablet   rosuvastatin (CRESTOR) 10 MG tablet   amLODipine (NORVASC) 2.5 MG tablet   Other Relevant Orders   Basic Metabolic Panel (BMET) (Completed)   Thrombocytopenia ([place])    Saw hematology.  Recommended f/u 2-3x/year.  Follow  cbc.       Other Visit Diagnoses     Need for influenza vaccination       Relevant Orders   Flu Vaccine QUAD High Dose(Fluad) (Completed)        Einar Pheasant, MD

## 2022-02-15 ENCOUNTER — Encounter: Payer: Self-pay | Admitting: *Deleted

## 2022-02-28 ENCOUNTER — Encounter: Payer: Self-pay | Admitting: Internal Medicine

## 2022-02-28 NOTE — Assessment & Plan Note (Signed)
Blood pressure as outlined.  Continue - lisinopril '40mg'$  q day.  Add amlodipine 2.'5mg'$  q day. Follow pressures.  Follow metabolic panel.  Continue current dose of metoprolol.

## 2022-02-28 NOTE — Assessment & Plan Note (Signed)
Saw hematology.  Recommended f/u 2-3x/year.  Follow cbc.

## 2022-02-28 NOTE — Assessment & Plan Note (Signed)
Previous cath - 2 vessel disease.  Risk factor modification.  Continue crestor.

## 2022-02-28 NOTE — Assessment & Plan Note (Signed)
Continue crestor 

## 2022-02-28 NOTE — Assessment & Plan Note (Signed)
Follow cbc.  

## 2022-02-28 NOTE — Assessment & Plan Note (Signed)
On crestor.  Low cholesterol diet and exercise.  Follow lipid panel and liver function tests.   

## 2022-02-28 NOTE — Assessment & Plan Note (Signed)
Reevaluation AVVS 01/18/22 - recommended f/u every other year.

## 2022-02-28 NOTE — Assessment & Plan Note (Signed)
F/u 01/18/22 - carotid duplex today shows stable 1 to 39% right ICA stenosis with some progression of his left ICA stenosis now into the 40 to 59% range. Continue aspirin, Plavix, and Crestor. Follow-up - 12 months.

## 2022-04-04 ENCOUNTER — Ambulatory Visit: Payer: Medicare HMO | Admitting: Cardiovascular Disease

## 2022-04-05 ENCOUNTER — Ambulatory Visit: Payer: Medicare HMO | Admitting: Cardiovascular Disease

## 2022-04-05 ENCOUNTER — Encounter: Payer: Self-pay | Admitting: Internal Medicine

## 2022-04-05 ENCOUNTER — Ambulatory Visit (INDEPENDENT_AMBULATORY_CARE_PROVIDER_SITE_OTHER): Payer: Medicare HMO | Admitting: Internal Medicine

## 2022-04-05 VITALS — BP 126/70 | HR 76 | Resp 16 | Ht 72.0 in | Wt 198.2 lb

## 2022-04-05 DIAGNOSIS — I723 Aneurysm of iliac artery: Secondary | ICD-10-CM | POA: Diagnosis not present

## 2022-04-05 DIAGNOSIS — D649 Anemia, unspecified: Secondary | ICD-10-CM | POA: Diagnosis not present

## 2022-04-05 DIAGNOSIS — I10 Essential (primary) hypertension: Secondary | ICD-10-CM | POA: Diagnosis not present

## 2022-04-05 DIAGNOSIS — D696 Thrombocytopenia, unspecified: Secondary | ICD-10-CM | POA: Diagnosis not present

## 2022-04-05 DIAGNOSIS — I2510 Atherosclerotic heart disease of native coronary artery without angina pectoris: Secondary | ICD-10-CM

## 2022-04-05 DIAGNOSIS — I700 Atherosclerosis of aorta: Secondary | ICD-10-CM

## 2022-04-05 DIAGNOSIS — I6523 Occlusion and stenosis of bilateral carotid arteries: Secondary | ICD-10-CM

## 2022-04-05 DIAGNOSIS — E785 Hyperlipidemia, unspecified: Secondary | ICD-10-CM | POA: Diagnosis not present

## 2022-04-05 MED ORDER — AMLODIPINE BESYLATE 5 MG PO TABS: 5.0000 mg | ORAL_TABLET | Freq: Every day | ORAL | 1 refills | Status: DC

## 2022-04-05 NOTE — Progress Notes (Signed)
Subjective:    Patient ID: Brett Riley, male    DOB: 1935-11-08, 87 y.o.   MRN: [id number]  Patient here for No chief complaint on file.   HPI Here to follow up regarding his blood pressure and cholesterol.  Reports he is doing relatively well.  Some increased stress - working.  Owns his own business.  Some stress related to this.  Overall he feels he is handling things relatively well. Saw Dr Lucky Cowboy 01/18/22 -  His carotid duplex today shows stable 1 to 39% right ICA stenosis with some progression of his left ICA stenosis now just into the 40 to 59% range.  Recommended continuing aspirin, plavix and crestor.  F/u 12 months. Blood pressure elevated last visit.  Added amlodipine 2.'5mg'$  q day.  Continues on lisiopril. Tolerating the medication.  Brings in blood pressure readings from outside - most averaging - 120-130s/60-70s.  Checked his cuff - reads slightly more than our cuff.  Checks his pressure at night after relaxing.     Past Medical History:  Diagnosis Date   Cancer [facility])    skin cancer basal   Carotid arterial disease ([place])    a. 02/2016 L CEA; b. 25/8527 U/S: patent LICA, 7-82% RICA.   Coronary artery disease    a. 01/2016 MV: mild apical/basal inferior, apical lateral, mid anterolateral, and mid inferolateral ischemia. EF 57%; b. 02/2016 Cath: LM 40ost, LAD 70p/m, 20d, D1 95 (small), LCX nl, RCA 43m RPDA 90 (small), EF 55-65%-->med Rx. Rec CABG for recurrent symptoms.   History of echocardiogram    a. 02/2016 Echo: EF 50-55%, no rwma, mild MR.   History of kidney stones    Hypercholesterolemia    Hypertension    Past Surgical History:  Procedure Laterality Date   CARDIAC CATHETERIZATION N/A 01/19/2016   Procedure: Left Heart Cath and Coronary Angiography;  Surgeon: MWellington Hampshire MD;  [facility];  Service: Cardiovascular;  Laterality: N/A;   COLONOSCOPY     CYSTOSCOPY/URETEROSCOPY/HOLMIUM LASER/STENT PLACEMENT Right 04/13/2021   Procedure:  CYSTOSCOPY/URETEROSCOPY/HOLMIUM LASER/STENT PLACEMENT;  Surgeon: SBilley Co MD;  Location: [facility] OR[facility];  Service: Urology;  Laterality: Right;   ENDARTERECTOMY Left 02/15/2016   Procedure: ENDARTERECTOMY CAROTID;  Surgeon: JAlgernon Huxley MD;  Location: [facility] ORS;  Service: Vascular;  Laterality: Left;   Family History  Problem Relation Age of Onset   Heart disease Father        MI   Heart attack Father    Stroke Mother    Breast cancer Sister    Colon cancer Neg Hx    Prostate cancer Neg Hx    Social History   Socioeconomic History   Marital status: Married    Spouse name: Not on file   Number of children: 1   Years of education: Not on file   Highest education level: Not on file  Occupational History    Employer: [employer]  Tobacco Use   Smoking status: Never   Smokeless tobacco: Never  Vaping Use   Vaping Use: Never used  Substance and Sexual Activity   Alcohol use: No    Alcohol/week: 0.0 standard drinks of alcohol   Drug use: No   Sexual activity: Not Currently  Other Topics Concern   Not on file  Social History Narrative   Not on file   Social Determinants of Health   Financial Resource Strain: Low Risk  (08/29/2020)   Overall Financial Resource Strain (CARDIA)    Difficulty  of Paying Living Expenses: Not hard at all  Food Insecurity: No Food Insecurity (08/29/2020)   Hunger Vital Sign    Worried About Running Out of Food in the Last Year: Never true    Ran Out of Food in the Last Year: Never true  Transportation Needs: No Transportation Needs (08/29/2020)   PRAPARE - Hydrologist (Medical): No    Lack of Transportation (Non-Medical): No  Physical Activity: Insufficiently Active (08/29/2020)   Exercise Vital Sign    Days of Exercise per Week: 3 days    Minutes of Exercise per Session: 20 min  Stress: No Stress Concern Present (08/29/2020)   [place]    Feeling  of Stress : Not at all  Social Connections: Unknown (08/29/2020)   Social Connection and Isolation Panel [NHANES]    Frequency of Communication with Friends and Family: Not on file    Frequency of Social Gatherings with Friends and Family: Not on file    Attends Religious Services: Not on file    Active Member of Clubs or Organizations: Not on file    Attends Archivist Meetings: Not on file    Marital Status: Married     Review of Systems  Constitutional:  Negative for appetite change and unexpected weight change.  HENT:  Negative for congestion and sinus pressure.   Respiratory:  Negative for cough, chest tightness and shortness of breath.   Cardiovascular:  Negative for chest pain, palpitations and leg swelling.  Gastrointestinal:  Negative for abdominal pain, diarrhea, nausea and vomiting.  Genitourinary:  Negative for difficulty urinating and dysuria.  Musculoskeletal:  Negative for joint swelling and myalgias.  Skin:  Negative for color change and rash.  Neurological:  Negative for dizziness and headaches.  Psychiatric/Behavioral:  Negative for agitation and dysphoric mood.        Objective:     BP 126/70   Pulse 76   Resp 16   Ht 6' (1.829 m)   Wt 198 lb 3.2 oz (89.9 kg)   SpO2 97%   BMI 26.88 kg/m  Wt Readings from Last 3 Encounters:  04/05/22 198 lb 3.2 oz (89.9 kg)  02/14/22 201 lb 9.6 oz (91.4 kg)  01/18/22 201 lb 12.8 oz (91.5 kg)    Physical Exam Vitals reviewed.  Constitutional:      General: He is not in acute distress.    Appearance: Normal appearance. He is well-developed.  HENT:     Head: Normocephalic and atraumatic.     Right Ear: External ear normal.     Left Ear: External ear normal.  Eyes:     General: No scleral icterus.       Right eye: No discharge.        Left eye: No discharge.     Conjunctiva/sclera: Conjunctivae normal.  Cardiovascular:     Rate and Rhythm: Normal rate and regular rhythm.  Pulmonary:     Effort:  Pulmonary effort is normal. No respiratory distress.     Breath sounds: Normal breath sounds.  Abdominal:     General: Bowel sounds are normal.     Palpations: Abdomen is soft.     Tenderness: There is no abdominal tenderness.  Musculoskeletal:        General: No swelling or tenderness.     Cervical back: Neck supple. No tenderness.  Lymphadenopathy:     Cervical: No cervical adenopathy.  Skin:  Findings: No erythema or rash.  Neurological:     Mental Status: He is alert.  Psychiatric:        Mood and Affect: Mood normal.        Behavior: Behavior normal.      Outpatient Encounter Medications as of 04/05/2022  Medication Sig   amLODipine (NORVASC) 5 MG tablet Take 1 tablet (5 mg total) by mouth daily.   aspirin 81 MG tablet Take 81 mg by mouth at bedtime.    clopidogrel (PLAVIX) 75 MG tablet TAKE 1 TABLET BY MOUTH EVERY DAY WITH BREAKFAST   lisinopril (ZESTRIL) 40 MG tablet Take 1 tablet (40 mg total) by mouth daily.   metoprolol succinate (TOPROL-XL) 100 MG 24 hr tablet TAKE 1 TABLET BY MOUTH EVERY DAY WITH A MEAL   Multiple Vitamins-Iron (MULTIVITAMINS WITH IRON) TABS Take 1 tablet by mouth daily.   rosuvastatin (CRESTOR) 10 MG tablet Take 1 tablet (10 mg total) by mouth daily.   vitamin B-12 (CYANOCOBALAMIN) 1000 MCG tablet Take 1,000 mcg by mouth daily.   [DISCONTINUED] amLODipine (NORVASC) 2.5 MG tablet Take 1 tablet (2.5 mg total) by mouth daily.   No facility-administered encounter medications on file as of 04/05/2022.     Lab Results  Component Value Date   WBC 5.1 02/14/2022   HGB 14.1 02/14/2022   HCT 41.5 02/14/2022   PLT 167.0 02/14/2022   GLUCOSE 98 02/14/2022   CHOL 131 02/14/2022   TRIG 113.0 02/14/2022   HDL 44.20 02/14/2022   LDLCALC 64 02/14/2022   ALT 17 02/14/2022   AST 21 02/14/2022   NA 138 02/14/2022   K 4.3 02/14/2022   CL 101 02/14/2022   CREATININE 1.05 02/14/2022   BUN 20 02/14/2022   CO2 31 02/14/2022   TSH 2.46 10/11/2021   PSA 0.80  12/08/2019   INR 1.08 02/13/2016    MR HIP LEFT W WO CONTRAST  Result Date: 05/27/2021 CLINICAL DATA:  Evaluate incidentally discovered left acetabular bone lesion EXAM: MRI OF THE LEFT HIP WITHOUT AND WITH CONTRAST TECHNIQUE: Multiplanar, multisequence MR imaging was performed both before and after administration of intravenous contrast. CONTRAST:  69m GADAVIST GADOBUTROL 1 MMOL/ML IV SOLN COMPARISON:  CT 04/09/2021 FINDINGS: Bones: Well-defined cystic lesion within the posterior left acetabulum measuring 3.3 x 2.3 x 2.8 cm (series 7, image 17). Lesion is T2 hyperintense with a few thin internal septations. Thin peripheral and septal enhancement on postcontrast sequences. Lesion is contiguous with the left hip joint space (series 11, images 19-21). No solid component. No marrow edema or other signal abnormality within the adjacent bone. 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 No acute fracture. No dislocation. No femoral head avascular necrosis. Bony pelvis intact without diastasis. SI joints and pubic symphysis within normal limits. Mild-moderate osteoarthritis of the right hip joint with mild subchondral marrow edema. Multilevel degenerative disc disease of the lower lumbar spine with associated discogenic endplate marrow changes. No extra-articular bone marrow edema. No suspicious marrow replacing bone lesion. Articular cartilage and labrum Articular cartilage: Diffuse cartilage thinning with areas of full-thickness cartilage loss involving the superior aspect of the femoral head and acetabulum. Prominent subchondral bone marrow edema within the femoral head superiorly. No well-defined subchondral fracture line. Labrum:  Degenerative fraying of the superior labrum. Joint or bursal effusion Joint effusion:  None. Bursae: No abnormal bursal fluid collection. Muscles and tendons Muscles and tendons: Severe tendinosis of the right gluteus minimus tendon. The left gluteal, hamstring,  iliopsoas, rectus femoris,  and adductor tendons appear intact without tear or significant tendinosis. Normal muscle bulk and signal intensity without edema, atrophy, or fatty infiltration. Other findings Miscellaneous: No soft tissue edema or fluid collection. No inguinal lymphadenopathy. No enhancing soft tissue mass. Mildly enlarged prostate gland. IMPRESSION: 1. Well-defined 3.3 cm cystic lesion within the posterior left acetabulum measuring up to 3.3 cm, contiguous with the left hip joint space. 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 No suspicious features are identified. 2. Moderate osteoarthritis of the bilateral hips, left worse than right. 3. Prominent subchondral bone marrow edema within the left femoral head superiorly. No well-defined subchondral fracture line is identified. 4. Severe tendinosis of the right gluteus minimus tendon. 5. Mildly enlarged prostate gland. Electronically Signed   By: Davina Poke D.O.   On: 05/27/2021 18:46       Assessment & Plan:  Hypertension, unspecified type Assessment & Plan: Blood pressure as outlined.  On lisinopril '40mg'$  q day.  Added amlodipine 2.'5mg'$  q day last visit.  Blood pressure elevated in the office.  His check recorded as second blood pressure - 126/70 - home check.  Running higher in the office.  Increased amlodipine to '5mg'$  q day. Follow pressures.  Follow metabolic panel.  Continue current dose of metoprolol.    Thrombocytopenia ([place]) Assessment & Plan: Saw hematology.  Recommended f/u 2-3x/year.  Follow cbc.    Hyperlipidemia, unspecified hyperlipidemia type Assessment & Plan: On crestor.  Low cholesterol diet and exercise.  Follow lipid panel and liver function tests.     Bilateral carotid artery stenosis Assessment & Plan: F/u 01/18/22 - carotid duplex today shows stable 1 to 39% right ICA stenosis with some progression of his left ICA stenosis now into the 40 to 59% range. Continue aspirin, Plavix, and  Crestor. Follow-up - 12 months.    Coronary artery disease involving native coronary artery of native heart without angina pectoris Assessment & Plan: Previous cath - 2 vessel disease.  Risk factor modification.  Continue crestor.    Aortic atherosclerosis ([place]) Assessment & Plan: Continue crestor.     Aneurysm artery, iliac common [facility]) Assessment & Plan: Reevaluation [facility] 01/18/22 - recommended f/u every other year.    Anemia, unspecified type Assessment & Plan: Follow cbc.    Other orders -     amLODIPine Besylate; Take 1 tablet (5 mg total) by mouth daily.  Dispense: 90 tablet; Refill: 1     Einar Pheasant, MD

## 2022-04-07 ENCOUNTER — Encounter: Payer: Self-pay | Admitting: Internal Medicine

## 2022-04-07 NOTE — Assessment & Plan Note (Signed)
Continue crestor 

## 2022-04-07 NOTE — Assessment & Plan Note (Signed)
Previous cath - 2 vessel disease.  Risk factor modification.  Continue crestor.

## 2022-04-07 NOTE — Assessment & Plan Note (Signed)
Blood pressure as outlined.  On lisinopril '40mg'$  q day.  Added amlodipine 2.'5mg'$  q day last visit.  Blood pressure elevated in the office.  His check recorded as second blood pressure - 126/70 - home check.  Running higher in the office.  Increased amlodipine to '5mg'$  q day. Follow pressures.  Follow metabolic panel.  Continue current dose of metoprolol.

## 2022-04-07 NOTE — Assessment & Plan Note (Signed)
Saw hematology.  Recommended f/u 2-3x/year.  Follow cbc.

## 2022-04-07 NOTE — Assessment & Plan Note (Signed)
On crestor.  Low cholesterol diet and exercise.  Follow lipid panel and liver function tests.   

## 2022-04-07 NOTE — Assessment & Plan Note (Signed)
Reevaluation AVVS 01/18/22 - recommended f/u every other year.

## 2022-04-07 NOTE — Assessment & Plan Note (Signed)
F/u 01/18/22 - carotid duplex today shows stable 1 to 39% right ICA stenosis with some progression of his left ICA stenosis now into the 40 to 59% range. Continue aspirin, Plavix, and Crestor. Follow-up - 12 months.

## 2022-04-07 NOTE — Assessment & Plan Note (Signed)
Follow cbc.  

## 2022-04-11 ENCOUNTER — Encounter: Payer: Self-pay | Admitting: Cardiovascular Disease

## 2022-04-11 ENCOUNTER — Ambulatory Visit: Payer: Medicare HMO | Attending: Cardiovascular Disease | Admitting: Cardiovascular Disease

## 2022-04-11 VITALS — BP 150/82 | HR 67 | Ht 72.0 in | Wt 202.0 lb

## 2022-04-11 DIAGNOSIS — I2510 Atherosclerotic heart disease of native coronary artery without angina pectoris: Secondary | ICD-10-CM | POA: Diagnosis not present

## 2022-04-11 DIAGNOSIS — I10 Essential (primary) hypertension: Secondary | ICD-10-CM | POA: Diagnosis not present

## 2022-04-11 DIAGNOSIS — I779 Disorder of arteries and arterioles, unspecified: Secondary | ICD-10-CM | POA: Diagnosis not present

## 2022-04-11 DIAGNOSIS — E785 Hyperlipidemia, unspecified: Secondary | ICD-10-CM | POA: Diagnosis not present

## 2022-04-11 NOTE — Patient Instructions (Signed)
Medication Instructions:  No changes *If you need a refill on your cardiac medications before your next appointment, please call your pharmacy*   Lab Work: None ordered If you have labs (blood work) drawn today and your tests are completely normal, you will receive your results only by: MyChart Message (if you have MyChart) OR A paper copy in the mail If you have any lab test that is abnormal or we need to change your treatment, we will call you to review the results.   Testing/Procedures: None ordered   Follow-Up: At [facility], you and your health needs are our priority.  As part of our continuing mission to provide you with exceptional heart care, we have created designated Provider Care Teams.  These Care Teams include your primary Cardiologist (physician) and Advanced Practice Providers (APPs -  Physician Assistants and Nurse Practitioners) who all work together to provide you with the care you need, when you need it.  We recommend signing up for the patient portal called "MyChart".  Sign up information is provided on this After Visit Summary.  MyChart is used to connect with patients for Virtual Visits (Telemedicine).  Patients are able to view lab/test results, encounter notes, upcoming appointments, etc.  Non-urgent messages can be sent to your provider as well.   To learn more about what you can do with MyChart, go to [email or web address].    Your next appointment:   12 month(s)  Provider:   You may see Muhammad Arida, MD or one of the following Advanced Practice Providers on your designated Care Team:   Christopher Berge, NP Ryan Dunn, PA-C Cadence Furth, PA-C Sheri Hammock, NP    

## 2022-04-11 NOTE — Progress Notes (Signed)
Cardiology Office Note   Date:  04/11/2022   ID:  Brett Riley, DOB [birth year], MRN [id number]  PCP:  Einar Pheasant, MD  Cardiologist:   Kathlyn Sacramento, MD   Chief Complaint  Patient presents with   Follow-up    No new cardiac concerns       History of Present Illness: Brett Riley is a 87 y.o. male who presents for a follow up visit regarding coronary artery disease.  He has known history of essential hypertension, hyperlipidemia and carotid artery disease status post left carotid endarterectomy. He was initially seen in 2019 in the setting of preop cardiovascular evaluation for left carotid endarterectomy.  His stress test was abnormal.  Cardiac catheterization was performed which showed moderate 40% ostial left main stenosis and 70% proximal and mid LAD disease.  He had significant diagonal and right PDA disease.  However, both of these vessels were very small.  Medical therapy was recommended. He is followed by Dr. Lucky Cowboy for carotid disease and common iliac artery aneurysm. 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  Has been doing well with no chest pain, shortness of breath or palpitations.  His blood pressure was elevated recently when he saw Dr. Nicki Reaper and the dose of amlodipine was increased to 5 mg once daily.    Past Medical History:  Diagnosis Date   Cancer [facility])    skin cancer basal   Carotid arterial disease ([place])    a. 02/2016 L CEA; b. 41/9622 U/S: patent LICA, 2-97% RICA.   Coronary artery disease    a. 01/2016 MV: mild apical/basal inferior, apical lateral, mid anterolateral, and mid inferolateral ischemia. EF 57%; b. 02/2016 Cath: LM 40ost, LAD 70p/m, 20d, D1 95 (small), LCX nl, RCA 16m RPDA 90 (small), EF 55-65%-->med Rx. Rec CABG for recurrent symptoms.   History of echocardiogram    a. 02/2016 Echo: EF 50-55%, no rwma, mild MR.   History of kidney stones    Hypercholesterolemia    Hypertension     Past Surgical  History:  Procedure Laterality Date   CARDIAC CATHETERIZATION N/A 01/19/2016   Procedure: Left Heart Cath and Coronary Angiography;  Surgeon: MWellington Hampshire MD;  [facility];  Service: Cardiovascular;  Laterality: N/A;   COLONOSCOPY     CYSTOSCOPY/URETEROSCOPY/HOLMIUM LASER/STENT PLACEMENT Right 04/13/2021   Procedure: CYSTOSCOPY/URETEROSCOPY/HOLMIUM LASER/STENT PLACEMENT;  Surgeon: SBilley Co MD;  Location: [facility];  Service: Urology;  Laterality: Right;   ENDARTERECTOMY Left 02/15/2016   Procedure: ENDARTERECTOMY CAROTID;  Surgeon: JAlgernon Huxley MD;  Location: [facility];  Service: Vascular;  Laterality: Left;     Current Outpatient Medications  Medication Sig Dispense Refill   amLODipine (NORVASC) 5 MG tablet Take 1 tablet (5 mg total) by mouth daily. 90 tablet 1   aspirin 81 MG tablet Take 81 mg by mouth at bedtime.      clopidogrel (PLAVIX) 75 MG tablet TAKE 1 TABLET BY MOUTH EVERY DAY WITH BREAKFAST 90 tablet 1   lisinopril (ZESTRIL) 40 MG tablet Take 1 tablet (40 mg total) by mouth daily. 90 tablet 1   metoprolol succinate (TOPROL-XL) 100 MG 24 hr tablet TAKE 1 TABLET BY MOUTH EVERY DAY WITH A MEAL 90 tablet 2   Multiple Vitamins-Iron (MULTIVITAMINS WITH IRON) TABS Take 1 tablet by mouth daily.     rosuvastatin (CRESTOR) 10 MG tablet Take 1 tablet (10 mg total) by mouth daily. 90 tablet 3  vitamin B-12 (CYANOCOBALAMIN) 1000 MCG tablet Take 1,000 mcg by mouth daily.     No current facility-administered medications for this visit.    Allergies:   Patient has no known allergies.    Social History:  The patient  reports that he has never smoked. He has never used smokeless tobacco. He reports that he does not drink alcohol and does not use drugs.   Family History:  The patient's family history includes Breast cancer in his sister; Heart attack in his father; Heart disease in his father; Stroke in his mother.    ROS:  Please see the history of present  illness.   Otherwise, review of systems are positive for none.   All other systems are reviewed and negative.    PHYSICAL EXAM: VS:  BP (!) 150/82 (BP Location: Left Arm, Patient Position: Sitting, Cuff Size: Large)   Pulse 67   Ht 6' (1.829 m)   Wt 202 lb (91.6 kg)   SpO2 96%   BMI 27.40 kg/m  , BMI Body mass index is 27.4 kg/m. GEN: Well nourished, well developed, in no acute distress  HEENT: normal  Neck: no JVD, carotid bruits, or masses Cardiac: RRR; no murmurs, rubs, or gallops,no edema  Respiratory:  clear to auscultation bilaterally, normal work of breathing GI: soft, nontender, nondistended, + BS MS: no deformity or atrophy  Skin: warm and dry, no rash Neuro:  Strength and sensation are intact Psych: euthymic mood, full affect   EKG:  EKG is ordered today. The ekg ordered today demonstrates normal sinus rhythm with no significant ST or T wave changes.  Recent Labs: 10/11/2021: TSH 2.46 02/14/2022: ALT 17; BUN 20; Creatinine, Ser 1.05; Hemoglobin 14.1; Platelets 167.0; Potassium 4.3; Sodium 138    Lipid Panel    Component Value Date/Time   CHOL 131 02/14/2022 0912   TRIG 113.0 02/14/2022 0912   HDL 44.20 02/14/2022 0912   CHOLHDL 3 02/14/2022 0912   VLDL 22.6 02/14/2022 0912   LDLCALC 64 02/14/2022 0912      Wt Readings from Last 3 Encounters:  04/11/22 202 lb (91.6 kg)  04/05/22 198 lb 3.2 oz (89.9 kg)  02/14/22 201 lb 9.6 oz (91.4 kg)           01/11/2016   11:07 AM 01/11/2016   10:26 AM  PAD Screen  Previous PAD dx? No No  Previous surgical procedure? No No  Pain with walking? No No  Feet/toe relief with dangling? No No  Painful, non-healing ulcers? No No  Extremities discolored? No No      ASSESSMENT AND PLAN:  1.  Coronary artery disease involving native coronary arteries without angina: He is doing well with no anginal symptoms.  Continue medical therapy.  2.  Essential hypertension: His blood pressure continues to be elevated but he  reports much better readings at home.  The dose of amlodipine was recently increased to 5 mg once daily.  He is also on lisinopril and metoprolol.  If blood pressure remains elevated, recommend switching metoprolol to carvedilol.  3.  Hyperlipidemia: Continue treatment with rosuvastatin.  Most recent lipid profile showed an LDL of 64 which is at target.  4.  Carotid artery disease and common iliac artery aneurysm: Managed by vascular surgery   Disposition:   FU with me in 1 year  Signed,  Kathlyn Sacramento, MD  04/11/2022 4:18 PM    [place]

## 2022-06-04 ENCOUNTER — Ambulatory Visit (INDEPENDENT_AMBULATORY_CARE_PROVIDER_SITE_OTHER): Payer: Medicare HMO | Admitting: Internal Medicine

## 2022-06-04 DIAGNOSIS — E785 Hyperlipidemia, unspecified: Secondary | ICD-10-CM

## 2022-06-04 DIAGNOSIS — I2510 Atherosclerotic heart disease of native coronary artery without angina pectoris: Secondary | ICD-10-CM

## 2022-06-04 DIAGNOSIS — D696 Thrombocytopenia, unspecified: Secondary | ICD-10-CM

## 2022-06-04 DIAGNOSIS — I10 Essential (primary) hypertension: Secondary | ICD-10-CM

## 2022-06-04 NOTE — Progress Notes (Deleted)
Subjective:    Patient ID: Brett Riley, male    DOB: April 23, 1935, 87 y.o.   MRN: [id number]  Patient here for No chief complaint on file.   HPI Here to follow up regarding his blood pressure and cholesterol.  Reports he is doing relatively well.  Some increased stress - working.  Owns his own business.  Some stress related to this.  Overall he feels he is handling things relatively well.  Blood pressure remained elevated last visit.  Amlodipine increased to 5mg  q day.  Continued on lisinopril.  Saw Dr Fletcher Anon 04/11/22.  Previous cardiac catheterization was performed which showed moderate 40% ostial left main stenosis and 70% proximal and mid LAD disease.  He had significant diagonal and right PDA disease.  However, both of these vessels were very small.  Medical therapy was recommended. No changes made at that visit.     Past Medical History:  Diagnosis Date   Cancer [facility])    skin cancer basal   Carotid arterial disease ([place])    a. 02/2016 L CEA; b. [id number] U/S: patent LICA, [id number] RICA.   Coronary artery disease    a. 01/2016 MV: mild apical/basal inferior, apical lateral, mid anterolateral, and mid inferolateral ischemia. EF 57%; b. 02/2016 Cath: LM 40ost, LAD 70p/m, 20d, D1 95 (small), LCX nl, RCA 20m, RPDA 90 (small), EF 55-65%-->med Rx. Rec CABG for recurrent symptoms.   History of echocardiogram    a. 02/2016 Echo: EF 50-55%, no rwma, mild MR.   History of kidney stones    Hypercholesterolemia    Hypertension    Past Surgical History:  Procedure Laterality Date   CARDIAC CATHETERIZATION N/A 01/19/2016   Procedure: Left Heart Cath and Coronary Angiography;  Surgeon: Wellington Hampshire, MD;  Location: [place] [facility];  Service: Cardiovascular;  Laterality: N/A;   COLONOSCOPY     CYSTOSCOPY/URETEROSCOPY/HOLMIUM LASER/STENT PLACEMENT Right 04/13/2021   Procedure: CYSTOSCOPY/URETEROSCOPY/HOLMIUM LASER/STENT PLACEMENT;  Surgeon: Billey Co, MD;  Location: [facility] ORS;  Service:  Urology;  Laterality: Right;   ENDARTERECTOMY Left 02/15/2016   Procedure: ENDARTERECTOMY CAROTID;  Surgeon: Algernon Huxley, MD;  Location: [facility] ORS;  Service: Vascular;  Laterality: Left;   Family History  Problem Relation Age of Onset   Stroke Mother    Heart disease Father        MI   Heart attack Father    Breast cancer Sister    Colon cancer Neg Hx    Prostate cancer Neg Hx    Social History   Socioeconomic History   Marital status: Married    Spouse name: Not on file   Number of children: 1   Years of education: Not on file   Highest education level: Not on file  Occupational History    Employer: [employer]  Tobacco Use   Smoking status: Never   Smokeless tobacco: Never  Vaping Use   Vaping Use: Never used  Substance and Sexual Activity   Alcohol use: No    Alcohol/week: 0.0 standard drinks of alcohol   Drug use: No   Sexual activity: Not Currently  Other Topics Concern   Not on file  Social History Narrative   Not on file   Social Determinants of Health   Financial Resource Strain: Low Risk  (08/29/2020)   Overall Financial Resource Strain (CARDIA)    Difficulty of Paying Living Expenses: Not hard at all  Food Insecurity: No Food Insecurity (08/29/2020)   Hunger Vital Sign  Worried About Charity fundraiser in the Last Year: Never true    [place] in the Last Year: Never true  Transportation Needs: No Transportation Needs (08/29/2020)   PRAPARE - Hydrologist (Medical): No    Lack of Transportation (Non-Medical): No  Physical Activity: Insufficiently Active (08/29/2020)   Exercise Vital Sign    Days of Exercise per Week: 3 days    Minutes of Exercise per Session: 20 min  Stress: No Stress Concern Present (08/29/2020)   [place]    Feeling of Stress : Not at all  Social Connections: Unknown (08/29/2020)   Social Connection and Isolation Panel [NHANES]     Frequency of Communication with Friends and Family: Not on file    Frequency of Social Gatherings with Friends and Family: Not on file    Attends Religious Services: Not on file    Active Member of Clubs or Organizations: Not on file    Attends Archivist Meetings: Not on file    Marital Status: Married     Review of Systems     Objective:     There were no vitals taken for this visit. Wt Readings from Last 3 Encounters:  04/11/22 202 lb (91.6 kg)  04/05/22 198 lb 3.2 oz (89.9 kg)  02/14/22 201 lb 9.6 oz (91.4 kg)    Physical Exam   Outpatient Encounter Medications as of 06/04/2022  Medication Sig   amLODipine (NORVASC) 5 MG tablet Take 1 tablet (5 mg total) by mouth daily.   aspirin 81 MG tablet Take 81 mg by mouth at bedtime.    clopidogrel (PLAVIX) 75 MG tablet TAKE 1 TABLET BY MOUTH EVERY DAY WITH BREAKFAST   lisinopril (ZESTRIL) 40 MG tablet Take 1 tablet (40 mg total) by mouth daily.   metoprolol succinate (TOPROL-XL) 100 MG 24 hr tablet TAKE 1 TABLET BY MOUTH EVERY DAY WITH A MEAL   Multiple Vitamins-Iron (MULTIVITAMINS WITH IRON) TABS Take 1 tablet by mouth daily.   rosuvastatin (CRESTOR) 10 MG tablet Take 1 tablet (10 mg total) by mouth daily.   vitamin B-12 (CYANOCOBALAMIN) 1000 MCG tablet Take 1,000 mcg by mouth daily.   No facility-administered encounter medications on file as of 06/04/2022.     Lab Results  Component Value Date   WBC 5.1 02/14/2022   HGB 14.1 02/14/2022   HCT 41.5 02/14/2022   PLT 167.0 02/14/2022   GLUCOSE 98 02/14/2022   CHOL 131 02/14/2022   TRIG 113.0 02/14/2022   HDL 44.20 02/14/2022   LDLCALC 64 02/14/2022   ALT 17 02/14/2022   AST 21 02/14/2022   NA 138 02/14/2022   K 4.3 02/14/2022   CL 101 02/14/2022   CREATININE 1.05 02/14/2022   BUN 20 02/14/2022   CO2 31 02/14/2022   TSH 2.46 10/11/2021   PSA 0.80 12/08/2019   INR 1.08 02/13/2016    MR HIP LEFT W WO CONTRAST  Result Date: 05/27/2021 CLINICAL DATA:   Evaluate incidentally discovered left acetabular bone lesion EXAM: MRI OF THE LEFT HIP WITHOUT AND WITH CONTRAST TECHNIQUE: Multiplanar, multisequence MR imaging was performed both before and after administration of intravenous contrast. CONTRAST:  74mL GADAVIST GADOBUTROL 1 MMOL/ML IV SOLN COMPARISON:  CT 04/09/2021 FINDINGS: Bones: Well-defined cystic lesion within the posterior left acetabulum measuring 3.3 x 2.3 x 2.8 cm (series 7, image 17). Lesion is T2 hyperintense with a few thin internal septations. Thin  peripheral and septal enhancement on postcontrast sequences. Lesion is contiguous with the left hip joint space (series 11, images 19-21). No solid component. No marrow edema or other signal abnormality within the adjacent bone. 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 No acute fracture. No dislocation. No femoral head avascular necrosis. Bony pelvis intact without diastasis. SI joints and pubic symphysis within normal limits. Mild-moderate osteoarthritis of the right hip joint with mild subchondral marrow edema. Multilevel degenerative disc disease of the lower lumbar spine with associated discogenic endplate marrow changes. No extra-articular bone marrow edema. No suspicious marrow replacing bone lesion. Articular cartilage and labrum Articular cartilage: Diffuse cartilage thinning with areas of full-thickness cartilage loss involving the superior aspect of the femoral head and acetabulum. Prominent subchondral bone marrow edema within the femoral head superiorly. No well-defined subchondral fracture line. Labrum:  Degenerative fraying of the superior labrum. Joint or bursal effusion Joint effusion:  None. Bursae: No abnormal bursal fluid collection. Muscles and tendons Muscles and tendons: Severe tendinosis of the right gluteus minimus tendon. The left gluteal, hamstring, iliopsoas, rectus femoris, and adductor tendons appear intact without tear or significant tendinosis. Normal  muscle bulk and signal intensity without edema, atrophy, or fatty infiltration. Other findings Miscellaneous: No soft tissue edema or fluid collection. No inguinal lymphadenopathy. No enhancing soft tissue mass. Mildly enlarged prostate gland. IMPRESSION: 1. Well-defined 3.3 cm cystic lesion within the posterior left acetabulum measuring up to 3.3 cm, contiguous with the left hip joint space. 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 No suspicious features are identified. 2. Moderate osteoarthritis of the bilateral hips, left worse than right. 3. Prominent subchondral bone marrow edema within the left femoral head superiorly. No well-defined subchondral fracture line is identified. 4. Severe tendinosis of the right gluteus minimus tendon. 5. Mildly enlarged prostate gland. Electronically Signed   By: Davina Poke D.O.   On: 05/27/2021 18:46       Assessment & Plan:  There are no diagnoses linked to this encounter.   Einar Pheasant, MD

## 2022-06-05 ENCOUNTER — Encounter: Payer: Self-pay | Admitting: Internal Medicine

## 2022-06-05 NOTE — Progress Notes (Signed)
Patient ID: BELEN LALLO, male   DOB: Oct 09, 1935, 87 y.o.   MRN: [id number] Pt did not show for appt.  Was not seen. Rescheduled.

## 2022-06-06 ENCOUNTER — Ambulatory Visit (INDEPENDENT_AMBULATORY_CARE_PROVIDER_SITE_OTHER): Payer: Medicare HMO | Admitting: Internal Medicine

## 2022-06-06 ENCOUNTER — Encounter: Payer: Self-pay | Admitting: Internal Medicine

## 2022-06-06 VITALS — BP 124/80 | HR 76 | Temp 97.7°F | Ht 72.0 in | Wt 197.0 lb

## 2022-06-06 DIAGNOSIS — I723 Aneurysm of iliac artery: Secondary | ICD-10-CM | POA: Diagnosis not present

## 2022-06-06 DIAGNOSIS — I779 Disorder of arteries and arterioles, unspecified: Secondary | ICD-10-CM | POA: Diagnosis not present

## 2022-06-06 DIAGNOSIS — D649 Anemia, unspecified: Secondary | ICD-10-CM | POA: Diagnosis not present

## 2022-06-06 DIAGNOSIS — E785 Hyperlipidemia, unspecified: Secondary | ICD-10-CM | POA: Diagnosis not present

## 2022-06-06 DIAGNOSIS — I6522 Occlusion and stenosis of left carotid artery: Secondary | ICD-10-CM | POA: Diagnosis not present

## 2022-06-06 DIAGNOSIS — I10 Essential (primary) hypertension: Secondary | ICD-10-CM

## 2022-06-06 DIAGNOSIS — I700 Atherosclerosis of aorta: Secondary | ICD-10-CM | POA: Diagnosis not present

## 2022-06-06 DIAGNOSIS — D696 Thrombocytopenia, unspecified: Secondary | ICD-10-CM

## 2022-06-06 DIAGNOSIS — I2510 Atherosclerotic heart disease of native coronary artery without angina pectoris: Secondary | ICD-10-CM | POA: Diagnosis not present

## 2022-06-06 LAB — HEPATIC FUNCTION PANEL
ALT: 17 U/L (ref 0–53)
AST: 20 U/L (ref 0–37)
Albumin: 4.5 g/dL (ref 3.5–5.2)
Alkaline Phosphatase: 80 U/L (ref 39–117)
Bilirubin, Direct: 0.1 mg/dL (ref 0.0–0.3)
Total Bilirubin: 0.5 mg/dL (ref 0.2–1.2)
Total Protein: 7.2 g/dL (ref 6.0–8.3)

## 2022-06-06 LAB — LIPID PANEL
Cholesterol: 129 mg/dL (ref 0–200)
HDL: 34.8 mg/dL — ABNORMAL LOW
LDL Cholesterol: 62 mg/dL (ref 0–99)
NonHDL: 93.84
Total CHOL/HDL Ratio: 4
Triglycerides: 161 mg/dL — ABNORMAL HIGH (ref 0.0–149.0)
VLDL: 32.2 mg/dL (ref 0.0–40.0)

## 2022-06-06 LAB — BASIC METABOLIC PANEL WITH GFR
BUN: 33 mg/dL — ABNORMAL HIGH (ref 6–23)
CO2: 28 meq/L (ref 19–32)
Calcium: 9.9 mg/dL (ref 8.4–10.5)
Chloride: 104 meq/L (ref 96–112)
Creatinine, Ser: 1.12 mg/dL (ref 0.40–1.50)
GFR: 60.35 mL/min
Glucose, Bld: 94 mg/dL (ref 70–99)
Potassium: 4.6 meq/L (ref 3.5–5.1)
Sodium: 138 meq/L (ref 135–145)

## 2022-06-06 NOTE — Progress Notes (Signed)
Subjective:    Patient ID: Brett Riley, male    DOB: Oct 09, 1935, 87 y.o.   MRN: [id number]  Patient here for  Chief Complaint  Patient presents with   Medical Management of Chronic Issues    HPI Here to follow up regarding his blood pressure and cholesterol.  Reports he is doing relatively well. Some increased stress - working.  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  Overall he feels he is handling things relatively well.  Blood pressure remained elevated last visit.  Amlodipine increased to 5mg  q day.  Continued on lisinopril.  Saw Dr Kirke Corin 04/11/22.  Previous cardiac catheterization was performed which showed moderate 40% ostial left main stenosis and 70% proximal and mid LAD disease.  He had significant diagonal and right PDA disease.  However, both of these vessels were very small.  Medical therapy was recommended. No changes made at that visit. Denies chest pain. No sob reported.  No abdominal pain.  Bowels moving.    Past Medical History:  Diagnosis Date   Cancer    skin cancer basal   Carotid arterial disease    a. 02/2016 L CEA; b. 01/2017 U/S: patent LICA, 1-39% RICA.   Coronary artery disease    a. 01/2016 MV: mild apical/basal inferior, apical lateral, mid anterolateral, and mid inferolateral ischemia. EF 57%; b. 02/2016 Cath: LM 40ost, LAD 70p/m, 20d, D1 95 (small), LCX nl, RCA 38m, RPDA 90 (small), EF 55-65%-->med Rx. Rec CABG for recurrent symptoms.   History of echocardiogram    a. 02/2016 Echo: EF 50-55%, no rwma, mild MR.   History of kidney stones    Hypercholesterolemia    Hypertension    Past Surgical History:  Procedure Laterality Date   CARDIAC CATHETERIZATION N/A 01/19/2016   Procedure: Left Heart Cath and Coronary Angiography;  Surgeon: Iran Ouch, MD;  Location: [facility] IN[facility];  Service: Cardiovascular;  Laterality: N/A;   COLONOSCOPY     CYSTOSCOPY/URETEROSCOPY/HOLMIUM LASER/STENT PLACEMENT Right 04/13/2021   Procedure:  CYSTOSCOPY/URETEROSCOPY/HOLMIUM LASER/STENT PLACEMENT;  Surgeon: Sondra Come, MD;  Location: [facility] OR[facility];  Service: Urology;  Laterality: Right;   ENDARTERECTOMY Left 02/15/2016   Procedure: ENDARTERECTOMY CAROTID;  Surgeon: Annice Needy, MD;  Location: [facility] OR[facility];  Service: Vascular;  Laterality: Left;   Family History  Problem Relation Age of Onset   Stroke Mother    Heart disease Father        MI   Heart attack Father    Breast cancer Sister    Colon cancer Neg Hx    Prostate cancer Neg Hx    Social History   Socioeconomic History   Marital status: Married    Spouse name: Not on file   Number of children: 1   Years of education: Not on file   Highest education level: Not on file  Occupational History    Employer: [employer]  Tobacco Use   Smoking status: Never   Smokeless tobacco: Never  Vaping Use   Vaping Use: Never used  Substance and Sexual Activity   Alcohol use: No    Alcohol/week: 0.0 standard drinks of alcohol   Drug use: No   Sexual activity: Not Currently  Other Topics Concern   Not on file  Social History Narrative   Not on file   Social Determinants of Health   Financial Resource Strain: Low Risk  (08/29/2020)   Overall Financial Resource Strain (CARDIA)    Difficulty of Paying Living Expenses:  Not hard at all  Food Insecurity: No Food Insecurity (08/29/2020)   Hunger Vital Sign    Worried About Running Out of Food in the Last Year: Never true    Ran Out of Food in the Last Year: Never true  Transportation Needs: No Transportation Needs (08/29/2020)   PRAPARE - Administrator, Civil ServiceTransportation    Lack of Transportation (Medical): No    Lack of Transportation (Non-Medical): No  Physical Activity: Insufficiently Active (08/29/2020)   Exercise Vital Sign    Days of Exercise per Week: 3 days    Minutes of Exercise per Session: 20 min  Stress: No Stress Concern Present (08/29/2020)   [employer] - Occupational Stress Questionnaire    Feeling  of Stress : Not at all  Social Connections: Unknown (08/29/2020)   Social Connection and Isolation Panel [NHANES]    Frequency of Communication with Friends and Family: Not on file    Frequency of Social Gatherings with Friends and Family: Not on file    Attends Religious Services: Not on file    Active Member of Clubs or Organizations: Not on file    Attends BankerClub or Organization Meetings: Not on file    Marital Status: Married     Review of Systems  Constitutional:  Negative for appetite change and unexpected weight change.  HENT:  Negative for congestion and sinus pressure.   Respiratory:  Negative for cough, chest tightness and shortness of breath.   Cardiovascular:  Negative for chest pain and palpitations.  Gastrointestinal:  Negative for abdominal pain, diarrhea, nausea and vomiting.  Genitourinary:  Negative for difficulty urinating and dysuria.  Musculoskeletal:  Negative for joint swelling and myalgias.  Skin:  Negative for color change and rash.  Neurological:  Negative for dizziness and headaches.  Psychiatric/Behavioral:  Negative for agitation and dysphoric mood.        Objective:     BP 124/80   Pulse 76   Temp 97.7 F (36.5 C) (Oral)   Ht 6' (1.829 m)   Wt 197 lb (89.4 kg)   SpO2 96%   BMI 26.72 kg/m  Wt Readings from Last 3 Encounters:  06/06/22 197 lb (89.4 kg)  04/11/22 202 lb (91.6 kg)  04/05/22 198 lb 3.2 oz (89.9 kg)    Physical Exam Vitals reviewed.  Constitutional:      General: He is not in acute distress.    Appearance: Normal appearance. He is well-developed.  HENT:     Head: Normocephalic and atraumatic.     Right Ear: External ear normal.     Left Ear: External ear normal.  Eyes:     General: No scleral icterus.       Right eye: No discharge.        Left eye: No discharge.     Conjunctiva/sclera: Conjunctivae normal.  Cardiovascular:     Rate and Rhythm: Normal rate and regular rhythm.  Pulmonary:     Effort: Pulmonary effort is  normal. No respiratory distress.     Breath sounds: Normal breath sounds.  Abdominal:     General: Bowel sounds are normal.     Palpations: Abdomen is soft.     Tenderness: There is no abdominal tenderness.  Musculoskeletal:        General: No swelling or tenderness.     Cervical back: Neck supple. No tenderness.  Lymphadenopathy:     Cervical: No cervical adenopathy.  Skin:    Findings: No erythema or rash.  Neurological:  Mental Status: He is alert.  Psychiatric:        Mood and Affect: Mood normal.        Behavior: Behavior normal.      Outpatient Encounter Medications as of 06/06/2022  Medication Sig   amLODipine (NORVASC) 5 MG tablet Take 1 tablet (5 mg total) by mouth daily.   aspirin 81 MG tablet Take 81 mg by mouth at bedtime.    clopidogrel (PLAVIX) 75 MG tablet TAKE 1 TABLET BY MOUTH EVERY DAY WITH BREAKFAST   lisinopril (ZESTRIL) 40 MG tablet Take 1 tablet (40 mg total) by mouth daily.   metoprolol succinate (TOPROL-XL) 100 MG 24 hr tablet TAKE 1 TABLET BY MOUTH EVERY DAY WITH A MEAL   Multiple Vitamins-Iron (MULTIVITAMINS WITH IRON) TABS Take 1 tablet by mouth daily.   rosuvastatin (CRESTOR) 10 MG tablet Take 1 tablet (10 mg total) by mouth daily.   vitamin B-12 (CYANOCOBALAMIN) 1000 MCG tablet Take 1,000 mcg by mouth daily.   No facility-administered encounter medications on file as of 06/06/2022.     Lab Results  Component Value Date   WBC 5.1 02/14/2022   HGB 14.1 02/14/2022   HCT 41.5 02/14/2022   PLT 167.0 02/14/2022   GLUCOSE 94 06/06/2022   CHOL 129 06/06/2022   TRIG 161.0 (H) 06/06/2022   HDL 34.80 (L) 06/06/2022   LDLCALC 62 06/06/2022   ALT 17 06/06/2022   AST 20 06/06/2022   NA 138 06/06/2022   K 4.6 06/06/2022   CL 104 06/06/2022   CREATININE 1.12 06/06/2022   BUN 33 (H) 06/06/2022   CO2 28 06/06/2022   TSH 2.46 10/11/2021   PSA 0.80 12/08/2019   INR 1.08 02/13/2016    MR HIP LEFT W WO CONTRAST  Result Date: 05/27/2021 CLINICAL  DATA:  Evaluate incidentally discovered left acetabular bone lesion EXAM: MRI OF THE LEFT HIP WITHOUT AND WITH CONTRAST TECHNIQUE: Multiplanar, multisequence MR imaging was performed both before and after administration of intravenous contrast. CONTRAST:  8mL GADAVIST GADOBUTROL 1 MMOL/ML IV SOLN COMPARISON:  CT 04/09/2021 FINDINGS: Bones: Well-defined cystic lesion within the posterior left acetabulum measuring 3.3 x 2.3 x 2.8 cm (series 7, image 17). Lesion is T2 hyperintense with a few thin internal septations. Thin peripheral and septal enhancement on postcontrast sequences. Lesion is contiguous with the left hip joint space (series 11, images 19-21). No solid component. No marrow edema or other signal abnormality within the adjacent bone. 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 No acute fracture. No dislocation. No femoral head avascular necrosis. Bony pelvis intact without diastasis. SI joints and pubic symphysis within normal limits. Mild-moderate osteoarthritis of the right hip joint with mild subchondral marrow edema. Multilevel degenerative disc disease of the lower lumbar spine with associated discogenic endplate marrow changes. No extra-articular bone marrow edema. No suspicious marrow replacing bone lesion. Articular cartilage and labrum Articular cartilage: Diffuse cartilage thinning with areas of full-thickness cartilage loss involving the superior aspect of the femoral head and acetabulum. Prominent subchondral bone marrow edema within the femoral head superiorly. No well-defined subchondral fracture line. Labrum:  Degenerative fraying of the superior labrum. Joint or bursal effusion Joint effusion:  None. Bursae: No abnormal bursal fluid collection. Muscles and tendons Muscles and tendons: Severe tendinosis of the right gluteus minimus tendon. The left gluteal, hamstring, iliopsoas, rectus femoris, and adductor tendons appear intact without tear or significant tendinosis.  Normal muscle bulk and signal intensity without edema, atrophy, or fatty infiltration. Other findings Miscellaneous:  No soft tissue edema or fluid collection. No inguinal lymphadenopathy. No enhancing soft tissue mass. Mildly enlarged prostate gland. IMPRESSION: 1. Well-defined 3.3 cm cystic lesion within the posterior left acetabulum measuring up to 3.3 cm, contiguous with the left hip joint space. 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 No suspicious features are identified. 2. Moderate osteoarthritis of the bilateral hips, left worse than right. 3. Prominent subchondral bone marrow edema within the left femoral head superiorly. No well-defined subchondral fracture line is identified. 4. Severe tendinosis of the right gluteus minimus tendon. 5. Mildly enlarged prostate gland. Electronically Signed   By: Duanne GuessNicholas  Plundo D.O.   On: 05/27/2021 18:46       Assessment & Plan:  Essential hypertension, benign Assessment & Plan: Blood pressure as outlined.  Continue - lisinopril 40mg  q day and amlodipine 5mg  q day. Follow pressures.  Follow metabolic panel.  Continue current dose of metoprolol.   Orders: -     Basic metabolic panel  Hyperlipidemia, unspecified hyperlipidemia type Assessment & Plan: On crestor.  Low cholesterol diet and exercise.  Follow lipid panel and liver function tests.    Orders: -     Lipid panel -     Hepatic function panel  Anemia, unspecified type Assessment & Plan: Follow cbc.    Aneurysm artery, iliac common Assessment & Plan: Reevaluation AVVS 01/18/22 - recommended f/u every other year.    Aortic atherosclerosis Assessment & Plan: Continue crestor.     Coronary artery disease involving native coronary artery of native heart without angina pectoris Assessment & Plan: Previous cath - 2 vessel disease.  Risk factor modification.  Continue crestor.    Carotid artery disease, unspecified laterality, unspecified type Assessment &  Plan: F/u 01/18/22 - carotid duplex today shows stable 1 to 39% right ICA stenosis with some progression of his left ICA stenosis now into the 40 to 59% range. Continue aspirin, Plavix, and Crestor. Follow-up - 12 months.    Carotid stenosis, asymptomatic, left Assessment & Plan: S/p left ICA CEA.  Followed by Dr Wyn Quakerew.  Continue risk factor modification.  Continue daily aspirin.    Thrombocytopenia Assessment & Plan: Saw hematology.  Recommended f/u 2-3x/year.  Follow cbc.       Dale Durhamharlene Risa Auman, MD

## 2022-06-09 ENCOUNTER — Encounter: Payer: Self-pay | Admitting: Internal Medicine

## 2022-06-09 NOTE — Assessment & Plan Note (Signed)
F/u 01/18/22 - carotid duplex today shows stable 1 to 39% right ICA stenosis with some progression of his left ICA stenosis now into the 40 to 59% range. Continue aspirin, Plavix, and Crestor. Follow-up - 12 months.  

## 2022-06-09 NOTE — Assessment & Plan Note (Signed)
Previous cath - 2 vessel disease.  Risk factor modification.  Continue crestor.  

## 2022-06-09 NOTE — Assessment & Plan Note (Signed)
Continue crestor 

## 2022-06-09 NOTE — Assessment & Plan Note (Signed)
Follow cbc.  

## 2022-06-09 NOTE — Assessment & Plan Note (Signed)
Saw hematology.  Recommended f/u 2-3x/year.  Follow cbc.  

## 2022-06-09 NOTE — Assessment & Plan Note (Signed)
Reevaluation AVVS 01/18/22 - recommended f/u every other year.  

## 2022-06-09 NOTE — Assessment & Plan Note (Signed)
Blood pressure as outlined.  Continue - lisinopril 40mg  q day and amlodipine 5mg  q day. Follow pressures.  Follow metabolic panel.  Continue current dose of metoprolol.

## 2022-06-09 NOTE — Assessment & Plan Note (Signed)
On crestor.  Low cholesterol diet and exercise.  Follow lipid panel and liver function tests.   

## 2022-06-09 NOTE — Assessment & Plan Note (Signed)
S/p left ICA CEA.  Followed by Dr Dew.  Continue risk factor modification.  Continue daily aspirin.  

## 2022-06-11 ENCOUNTER — Telehealth: Payer: Self-pay | Admitting: *Deleted

## 2022-06-11 DIAGNOSIS — N289 Disorder of kidney and ureter, unspecified: Secondary | ICD-10-CM

## 2022-06-11 NOTE — Telephone Encounter (Signed)
Pt spouse returned Latoya CMA call. Note below from provider was read to her. She's aware. I also booked pt for lab in 3 weeks, for 4/30.

## 2022-06-11 NOTE — Telephone Encounter (Signed)
Noted  

## 2022-06-11 NOTE — Telephone Encounter (Signed)
Left voicemail to return call for results (see below)     Dale [place], MD 06/07/2022  5:58 AM EDT     Notify - kidney function decreased some from recent check.  Make sure staying hydrated.  Avoid antiinflammatory medications.  We will follow.  Recheck basic metabolic panel in 3 weeks.  Non fasting lab.  Cholesterol is overall ok.  Triglycerides are slightly increased and good cholesterol slightly decreased. Continue diet and exercise and current medication.  We will follow.  Liver panel normal.

## 2022-06-26 DIAGNOSIS — D2261 Melanocytic nevi of right upper limb, including shoulder: Secondary | ICD-10-CM | POA: Diagnosis not present

## 2022-06-26 DIAGNOSIS — D225 Melanocytic nevi of trunk: Secondary | ICD-10-CM | POA: Diagnosis not present

## 2022-06-26 DIAGNOSIS — D2272 Melanocytic nevi of left lower limb, including hip: Secondary | ICD-10-CM | POA: Diagnosis not present

## 2022-06-26 DIAGNOSIS — L570 Actinic keratosis: Secondary | ICD-10-CM | POA: Diagnosis not present

## 2022-06-26 DIAGNOSIS — L821 Other seborrheic keratosis: Secondary | ICD-10-CM | POA: Diagnosis not present

## 2022-06-26 DIAGNOSIS — D2262 Melanocytic nevi of left upper limb, including shoulder: Secondary | ICD-10-CM | POA: Diagnosis not present

## 2022-06-26 DIAGNOSIS — D2271 Melanocytic nevi of right lower limb, including hip: Secondary | ICD-10-CM | POA: Diagnosis not present

## 2022-06-27 ENCOUNTER — Ambulatory Visit: Payer: Medicare HMO | Admitting: Cardiovascular Disease

## 2022-07-02 ENCOUNTER — Other Ambulatory Visit (INDEPENDENT_AMBULATORY_CARE_PROVIDER_SITE_OTHER): Payer: Medicare HMO

## 2022-07-02 DIAGNOSIS — N289 Disorder of kidney and ureter, unspecified: Secondary | ICD-10-CM | POA: Diagnosis not present

## 2022-07-02 LAB — BASIC METABOLIC PANEL WITH GFR
BUN: 26 mg/dL — ABNORMAL HIGH (ref 6–23)
CO2: 27 meq/L (ref 19–32)
Calcium: 9.4 mg/dL (ref 8.4–10.5)
Chloride: 107 meq/L (ref 96–112)
Creatinine, Ser: 1.05 mg/dL (ref 0.40–1.50)
GFR: 65.18 mL/min
Glucose, Bld: 95 mg/dL (ref 70–99)
Potassium: 4.6 meq/L (ref 3.5–5.1)
Sodium: 141 meq/L (ref 135–145)

## 2022-07-03 ENCOUNTER — Telehealth: Payer: Self-pay

## 2022-07-03 NOTE — Telephone Encounter (Signed)
LMTCB. Ok to give results to him or his wife when call is returned.

## 2022-07-03 NOTE — Telephone Encounter (Signed)
-----   Message from Dale Mendon, MD sent at 07/03/2022  5:00 AM EDT ----- Notify  - sodium and potassium wnl.  Kidney function improved from last check.  We will follow.

## 2022-07-20 ENCOUNTER — Inpatient Hospital Stay: Payer: Medicare HMO

## 2022-07-20 ENCOUNTER — Other Ambulatory Visit: Payer: Self-pay

## 2022-07-20 ENCOUNTER — Emergency Department: Payer: Medicare HMO

## 2022-07-20 ENCOUNTER — Inpatient Hospital Stay
Admission: EM | Admit: 2022-07-20 | Discharge: 2022-08-09 | DRG: 268 | Disposition: A | Discharge status: Skilled Nursing Facility | Payer: Medicare HMO | Attending: Internal Medicine | Admitting: Internal Medicine

## 2022-07-20 DIAGNOSIS — E1151 Type 2 diabetes mellitus with diabetic peripheral angiopathy without gangrene: Secondary | ICD-10-CM | POA: Diagnosis present

## 2022-07-20 DIAGNOSIS — Z66 Do not resuscitate: Secondary | ICD-10-CM | POA: Diagnosis not present

## 2022-07-20 DIAGNOSIS — E871 Hypo-osmolality and hyponatremia: Secondary | ICD-10-CM | POA: Insufficient documentation

## 2022-07-20 DIAGNOSIS — Z85828 Personal history of other malignant neoplasm of skin: Secondary | ICD-10-CM

## 2022-07-20 DIAGNOSIS — I10 Essential (primary) hypertension: Secondary | ICD-10-CM | POA: Diagnosis present

## 2022-07-20 DIAGNOSIS — I6782 Cerebral ischemia: Secondary | ICD-10-CM | POA: Diagnosis not present

## 2022-07-20 DIAGNOSIS — M4620 Osteomyelitis of vertebra, site unspecified: Secondary | ICD-10-CM

## 2022-07-20 DIAGNOSIS — I719 Aortic aneurysm of unspecified site, without rupture: Secondary | ICD-10-CM | POA: Diagnosis not present

## 2022-07-20 DIAGNOSIS — M1711 Unilateral primary osteoarthritis, right knee: Secondary | ICD-10-CM | POA: Diagnosis present

## 2022-07-20 DIAGNOSIS — N179 Acute kidney failure, unspecified: Secondary | ICD-10-CM | POA: Insufficient documentation

## 2022-07-20 DIAGNOSIS — E7800 Pure hypercholesterolemia, unspecified: Secondary | ICD-10-CM | POA: Diagnosis present

## 2022-07-20 DIAGNOSIS — I739 Peripheral vascular disease, unspecified: Secondary | ICD-10-CM | POA: Diagnosis not present

## 2022-07-20 DIAGNOSIS — R7881 Bacteremia: Secondary | ICD-10-CM

## 2022-07-20 DIAGNOSIS — E785 Hyperlipidemia, unspecified: Secondary | ICD-10-CM | POA: Diagnosis not present

## 2022-07-20 DIAGNOSIS — E663 Overweight: Secondary | ICD-10-CM | POA: Diagnosis present

## 2022-07-20 DIAGNOSIS — Z79899 Other long term (current) drug therapy: Secondary | ICD-10-CM

## 2022-07-20 DIAGNOSIS — M5440 Lumbago with sciatica, unspecified side: Secondary | ICD-10-CM | POA: Diagnosis not present

## 2022-07-20 DIAGNOSIS — K5730 Diverticulosis of large intestine without perforation or abscess without bleeding: Secondary | ICD-10-CM | POA: Diagnosis present

## 2022-07-20 DIAGNOSIS — G8929 Other chronic pain: Secondary | ICD-10-CM | POA: Diagnosis present

## 2022-07-20 DIAGNOSIS — I714 Abdominal aortic aneurysm, without rupture, unspecified: Principal | ICD-10-CM | POA: Diagnosis present

## 2022-07-20 DIAGNOSIS — F05 Delirium due to known physiological condition: Secondary | ICD-10-CM | POA: Diagnosis not present

## 2022-07-20 DIAGNOSIS — A4101 Sepsis due to Methicillin susceptible Staphylococcus aureus: Secondary | ICD-10-CM | POA: Insufficient documentation

## 2022-07-20 DIAGNOSIS — G062 Extradural and subdural abscess, unspecified: Secondary | ICD-10-CM | POA: Diagnosis not present

## 2022-07-20 DIAGNOSIS — M4626 Osteomyelitis of vertebra, lumbar region: Secondary | ICD-10-CM | POA: Diagnosis present

## 2022-07-20 DIAGNOSIS — I4891 Unspecified atrial fibrillation: Secondary | ICD-10-CM | POA: Diagnosis not present

## 2022-07-20 DIAGNOSIS — K219 Gastro-esophageal reflux disease without esophagitis: Secondary | ICD-10-CM | POA: Diagnosis not present

## 2022-07-20 DIAGNOSIS — I701 Atherosclerosis of renal artery: Secondary | ICD-10-CM | POA: Diagnosis not present

## 2022-07-20 DIAGNOSIS — K449 Diaphragmatic hernia without obstruction or gangrene: Secondary | ICD-10-CM | POA: Diagnosis not present

## 2022-07-20 DIAGNOSIS — M48061 Spinal stenosis, lumbar region without neurogenic claudication: Secondary | ICD-10-CM | POA: Diagnosis not present

## 2022-07-20 DIAGNOSIS — Z01810 Encounter for preprocedural cardiovascular examination: Secondary | ICD-10-CM

## 2022-07-20 DIAGNOSIS — I700 Atherosclerosis of aorta: Secondary | ICD-10-CM | POA: Diagnosis present

## 2022-07-20 DIAGNOSIS — Z7902 Long term (current) use of antithrombotics/antiplatelets: Secondary | ICD-10-CM

## 2022-07-20 DIAGNOSIS — R0902 Hypoxemia: Secondary | ICD-10-CM | POA: Diagnosis not present

## 2022-07-20 DIAGNOSIS — Z6825 Body mass index (BMI) 25.0-25.9, adult: Secondary | ICD-10-CM

## 2022-07-20 DIAGNOSIS — B9561 Methicillin susceptible Staphylococcus aureus infection as the cause of diseases classified elsewhere: Secondary | ICD-10-CM | POA: Insufficient documentation

## 2022-07-20 DIAGNOSIS — I723 Aneurysm of iliac artery: Secondary | ICD-10-CM | POA: Diagnosis present

## 2022-07-20 DIAGNOSIS — M4646 Discitis, unspecified, lumbar region: Secondary | ICD-10-CM | POA: Diagnosis not present

## 2022-07-20 DIAGNOSIS — M5136 Other intervertebral disc degeneration, lumbar region: Secondary | ICD-10-CM | POA: Diagnosis present

## 2022-07-20 DIAGNOSIS — M4316 Spondylolisthesis, lumbar region: Secondary | ICD-10-CM | POA: Diagnosis not present

## 2022-07-20 DIAGNOSIS — I4819 Other persistent atrial fibrillation: Secondary | ICD-10-CM | POA: Diagnosis not present

## 2022-07-20 DIAGNOSIS — Z7982 Long term (current) use of aspirin: Secondary | ICD-10-CM

## 2022-07-20 DIAGNOSIS — Z006 Encounter for examination for normal comparison and control in clinical research program: Secondary | ICD-10-CM

## 2022-07-20 DIAGNOSIS — I2510 Atherosclerotic heart disease of native coronary artery without angina pectoris: Secondary | ICD-10-CM | POA: Diagnosis present

## 2022-07-20 DIAGNOSIS — M47816 Spondylosis without myelopathy or radiculopathy, lumbar region: Secondary | ICD-10-CM | POA: Diagnosis not present

## 2022-07-20 DIAGNOSIS — R079 Chest pain, unspecified: Secondary | ICD-10-CM | POA: Diagnosis not present

## 2022-07-20 DIAGNOSIS — E1169 Type 2 diabetes mellitus with other specified complication: Secondary | ICD-10-CM | POA: Diagnosis present

## 2022-07-20 DIAGNOSIS — E872 Acidosis, unspecified: Secondary | ICD-10-CM | POA: Diagnosis present

## 2022-07-20 DIAGNOSIS — G9341 Metabolic encephalopathy: Secondary | ICD-10-CM | POA: Diagnosis not present

## 2022-07-20 DIAGNOSIS — M5127 Other intervertebral disc displacement, lumbosacral region: Secondary | ICD-10-CM | POA: Diagnosis not present

## 2022-07-20 DIAGNOSIS — R0989 Other specified symptoms and signs involving the circulatory and respiratory systems: Secondary | ICD-10-CM | POA: Diagnosis not present

## 2022-07-20 DIAGNOSIS — Z1152 Encounter for screening for COVID-19: Secondary | ICD-10-CM | POA: Diagnosis not present

## 2022-07-20 DIAGNOSIS — I25118 Atherosclerotic heart disease of native coronary artery with other forms of angina pectoris: Secondary | ICD-10-CM | POA: Diagnosis not present

## 2022-07-20 DIAGNOSIS — R443 Hallucinations, unspecified: Secondary | ICD-10-CM | POA: Diagnosis not present

## 2022-07-20 DIAGNOSIS — R509 Fever, unspecified: Secondary | ICD-10-CM | POA: Diagnosis not present

## 2022-07-20 DIAGNOSIS — D696 Thrombocytopenia, unspecified: Secondary | ICD-10-CM | POA: Diagnosis not present

## 2022-07-20 DIAGNOSIS — G061 Intraspinal abscess and granuloma: Secondary | ICD-10-CM | POA: Diagnosis present

## 2022-07-20 DIAGNOSIS — Z751 Person awaiting admission to adequate facility elsewhere: Secondary | ICD-10-CM

## 2022-07-20 DIAGNOSIS — R2681 Unsteadiness on feet: Secondary | ICD-10-CM | POA: Diagnosis not present

## 2022-07-20 DIAGNOSIS — M25461 Effusion, right knee: Secondary | ICD-10-CM | POA: Diagnosis not present

## 2022-07-20 DIAGNOSIS — I340 Nonrheumatic mitral (valve) insufficiency: Secondary | ICD-10-CM | POA: Diagnosis present

## 2022-07-20 DIAGNOSIS — Z7901 Long term (current) use of anticoagulants: Secondary | ICD-10-CM

## 2022-07-20 DIAGNOSIS — M47817 Spondylosis without myelopathy or radiculopathy, lumbosacral region: Secondary | ICD-10-CM | POA: Diagnosis not present

## 2022-07-20 DIAGNOSIS — Z7189 Other specified counseling: Secondary | ICD-10-CM | POA: Diagnosis not present

## 2022-07-20 DIAGNOSIS — M6259 Muscle wasting and atrophy, not elsewhere classified, multiple sites: Secondary | ICD-10-CM | POA: Diagnosis not present

## 2022-07-20 DIAGNOSIS — E222 Syndrome of inappropriate secretion of antidiuretic hormone: Secondary | ICD-10-CM | POA: Diagnosis present

## 2022-07-20 DIAGNOSIS — Z7401 Bed confinement status: Secondary | ICD-10-CM | POA: Diagnosis not present

## 2022-07-20 DIAGNOSIS — M545 Low back pain, unspecified: Secondary | ICD-10-CM | POA: Diagnosis not present

## 2022-07-20 DIAGNOSIS — M160 Bilateral primary osteoarthritis of hip: Secondary | ICD-10-CM | POA: Diagnosis present

## 2022-07-20 DIAGNOSIS — Z87442 Personal history of urinary calculi: Secondary | ICD-10-CM

## 2022-07-20 DIAGNOSIS — Z951 Presence of aortocoronary bypass graft: Secondary | ICD-10-CM

## 2022-07-20 DIAGNOSIS — Z743 Need for continuous supervision: Secondary | ICD-10-CM | POA: Diagnosis not present

## 2022-07-20 DIAGNOSIS — D649 Anemia, unspecified: Secondary | ICD-10-CM | POA: Diagnosis present

## 2022-07-20 DIAGNOSIS — R1310 Dysphagia, unspecified: Secondary | ICD-10-CM | POA: Diagnosis present

## 2022-07-20 DIAGNOSIS — T07XXXA Unspecified multiple injuries, initial encounter: Secondary | ICD-10-CM | POA: Diagnosis not present

## 2022-07-20 DIAGNOSIS — Z01818 Encounter for other preprocedural examination: Secondary | ICD-10-CM | POA: Diagnosis not present

## 2022-07-20 DIAGNOSIS — Z8249 Family history of ischemic heart disease and other diseases of the circulatory system: Secondary | ICD-10-CM

## 2022-07-20 DIAGNOSIS — M25561 Pain in right knee: Secondary | ICD-10-CM | POA: Diagnosis not present

## 2022-07-20 DIAGNOSIS — I779 Disorder of arteries and arterioles, unspecified: Secondary | ICD-10-CM | POA: Diagnosis not present

## 2022-07-20 DIAGNOSIS — M549 Dorsalgia, unspecified: Secondary | ICD-10-CM | POA: Diagnosis not present

## 2022-07-20 DIAGNOSIS — R109 Unspecified abdominal pain: Secondary | ICD-10-CM | POA: Diagnosis not present

## 2022-07-20 DIAGNOSIS — M4807 Spinal stenosis, lumbosacral region: Secondary | ICD-10-CM | POA: Diagnosis not present

## 2022-07-20 HISTORY — DX: Unspecified hearing loss, unspecified ear: H91.90

## 2022-07-20 HISTORY — DX: Ganglion, unspecified site: M67.40

## 2022-07-20 HISTORY — DX: Bilateral primary osteoarthritis of hip: M16.0

## 2022-07-20 HISTORY — DX: Stricture of artery: I77.1

## 2022-07-20 HISTORY — DX: Celiac artery compression syndrome: I77.4

## 2022-07-20 LAB — CBC WITH DIFFERENTIAL/PLATELET
Abs Immature Granulocytes: 0.04 10*3/uL (ref 0.00–0.07)
Basophils Absolute: 0 10*3/uL (ref 0.0–0.1)
Basophils Relative: 0 %
Eosinophils Absolute: 0 10*3/uL (ref 0.0–0.5)
Eosinophils Relative: 0 %
HCT: 39.7 % (ref 39.0–52.0)
Hemoglobin: 13.3 g/dL (ref 13.0–17.0)
Immature Granulocytes: 0 %
Lymphocytes Relative: 9 %
Lymphs Abs: 1 10*3/uL (ref 0.7–4.0)
MCH: 32.8 pg (ref 26.0–34.0)
MCHC: 33.5 g/dL (ref 30.0–36.0)
MCV: 97.8 fL (ref 80.0–100.0)
Monocytes Absolute: 0.6 10*3/uL (ref 0.1–1.0)
Monocytes Relative: 6 %
Neutro Abs: 8.6 10*3/uL — ABNORMAL HIGH (ref 1.7–7.7)
Neutrophils Relative %: 85 %
Platelets: 158 10*3/uL (ref 150–400)
RBC: 4.06 MIL/uL — ABNORMAL LOW (ref 4.22–5.81)
RDW: 13.1 % (ref 11.5–15.5)
WBC: 10.3 10*3/uL (ref 4.0–10.5)
nRBC: 0 % (ref 0.0–0.2)

## 2022-07-20 LAB — COMPREHENSIVE METABOLIC PANEL WITH GFR
ALT: 25 U/L (ref 0–44)
AST: 38 U/L (ref 15–41)
Albumin: 4.6 g/dL (ref 3.5–5.0)
Alkaline Phosphatase: 72 U/L (ref 38–126)
Anion gap: 8 (ref 5–15)
BUN: 25 mg/dL — ABNORMAL HIGH (ref 8–23)
CO2: 24 mmol/L (ref 22–32)
Calcium: 9.2 mg/dL (ref 8.9–10.3)
Chloride: 104 mmol/L (ref 98–111)
Creatinine, Ser: 0.99 mg/dL (ref 0.61–1.24)
GFR, Estimated: >60 mL/min
Glucose, Bld: 121 mg/dL — ABNORMAL HIGH (ref 70–99)
Potassium: 3.7 mmol/L (ref 3.5–5.1)
Sodium: 136 mmol/L (ref 135–145)
Total Bilirubin: 0.9 mg/dL (ref 0.3–1.2)
Total Protein: 7.7 g/dL (ref 6.5–8.1)

## 2022-07-20 LAB — D-DIMER, QUANTITATIVE: D-Dimer, Quant: 2.03 ug{FEU}/mL — ABNORMAL HIGH (ref 0.00–0.50)

## 2022-07-20 LAB — LIPASE, BLOOD: Lipase: 28 U/L (ref 11–51)

## 2022-07-20 MED ORDER — ACETAMINOPHEN 650 MG RE SUPP: 650.0000 mg | Freq: Four times a day (QID) | RECTAL | Status: DC | PRN

## 2022-07-20 MED ORDER — IOHEXOL 350 MG/ML SOLN
100.0000 mL | Freq: Once | INTRAVENOUS | Status: AC | PRN
  Administered 2022-07-20: 80 mL via INTRAVENOUS

## 2022-07-20 MED ORDER — SODIUM CHLORIDE 0.9 % IV BOLUS
500.0000 mL | Freq: Once | INTRAVENOUS | Status: AC
  Administered 2022-07-20: 500 mL via INTRAVENOUS

## 2022-07-20 MED ORDER — ROSUVASTATIN CALCIUM 10 MG PO TABS
10.0000 mg | ORAL_TABLET | Freq: Every day | ORAL | Status: DC
  Filled 2022-07-20: qty 1

## 2022-07-20 MED ORDER — ASPIRIN 81 MG PO TBEC
81.0000 mg | DELAYED_RELEASE_TABLET | Freq: Every day | ORAL | Status: DC
  Administered 2022-07-20 – 2022-07-22 (×3): 81 mg via ORAL
  Filled 2022-07-20 (×3): qty 1

## 2022-07-20 MED ORDER — ROSUVASTATIN CALCIUM 10 MG PO TABS
40.0000 mg | ORAL_TABLET | Freq: Every day | ORAL | Status: DC
  Administered 2022-07-20 – 2022-08-09 (×20): 40 mg via ORAL
  Filled 2022-07-20 (×5): qty 4
  Filled 2022-07-20: qty 2
  Filled 2022-07-20 (×15): qty 4

## 2022-07-20 MED ORDER — TRAZODONE HCL 50 MG PO TABS
25.0000 mg | ORAL_TABLET | Freq: Every evening | ORAL | Status: DC | PRN
  Administered 2022-07-30 – 2022-08-08 (×4): 25 mg via ORAL
  Filled 2022-07-20 (×4): qty 1

## 2022-07-20 MED ORDER — LISINOPRIL 20 MG PO TABS
40.0000 mg | ORAL_TABLET | Freq: Every day | ORAL | Status: DC
  Administered 2022-07-21 – 2022-07-22 (×2): 40 mg via ORAL
  Filled 2022-07-20 (×2): qty 2

## 2022-07-20 MED ORDER — HYDRALAZINE HCL 20 MG/ML IJ SOLN
10.0000 mg | Freq: Four times a day (QID) | INTRAMUSCULAR | Status: DC | PRN
  Administered 2022-07-31: 10 mg via INTRAVENOUS
  Filled 2022-07-20: qty 1

## 2022-07-20 MED ORDER — ACETAMINOPHEN 325 MG PO TABS
650.0000 mg | ORAL_TABLET | Freq: Four times a day (QID) | ORAL | Status: DC | PRN
  Administered 2022-07-23 – 2022-08-09 (×26): 650 mg via ORAL
  Filled 2022-07-20 (×26): qty 2

## 2022-07-20 MED ORDER — METOPROLOL SUCCINATE ER 100 MG PO TB24
100.0000 mg | ORAL_TABLET | Freq: Every day | ORAL | Status: DC
  Administered 2022-07-21 – 2022-07-22 (×2): 100 mg via ORAL
  Filled 2022-07-20 (×2): qty 1

## 2022-07-20 MED ORDER — MORPHINE SULFATE (PF) 4 MG/ML IV SOLN
4.0000 mg | Freq: Once | INTRAVENOUS | Status: AC
  Administered 2022-07-20: 4 mg via INTRAVENOUS
  Filled 2022-07-20: qty 1

## 2022-07-20 MED ORDER — MAGNESIUM HYDROXIDE 400 MG/5ML PO SUSP
30.0000 mL | Freq: Every day | ORAL | Status: DC | PRN
  Administered 2022-07-28: 30 mL via ORAL
  Filled 2022-07-20: qty 30

## 2022-07-20 MED ORDER — ENOXAPARIN SODIUM 40 MG/0.4ML IJ SOSY
40.0000 mg | PREFILLED_SYRINGE | INTRAMUSCULAR | Status: DC
  Administered 2022-07-20 – 2022-07-22 (×3): 40 mg via SUBCUTANEOUS
  Filled 2022-07-20 (×3): qty 0.4

## 2022-07-20 MED ORDER — FENTANYL CITRATE PF 50 MCG/ML IJ SOSY
12.5000 ug | PREFILLED_SYRINGE | INTRAMUSCULAR | Status: DC | PRN
  Administered 2022-07-20 – 2022-07-26 (×19): 12.5 ug via INTRAVENOUS
  Filled 2022-07-20 (×19): qty 1

## 2022-07-20 MED ORDER — CLOPIDOGREL BISULFATE 75 MG PO TABS
75.0000 mg | ORAL_TABLET | Freq: Every day | ORAL | Status: DC
  Administered 2022-07-21 – 2022-07-26 (×5): 75 mg via ORAL
  Filled 2022-07-20 (×5): qty 1

## 2022-07-20 MED ORDER — LACTATED RINGERS IV SOLN: INTRAVENOUS | Status: DC

## 2022-07-20 MED ORDER — ONDANSETRON HCL 4 MG/2ML IJ SOLN
4.0000 mg | Freq: Once | INTRAMUSCULAR | Status: AC
  Administered 2022-07-20: 4 mg via INTRAVENOUS
  Filled 2022-07-20: qty 2

## 2022-07-20 MED ORDER — VITAMIN B-12 1000 MCG PO TABS
1000.0000 ug | ORAL_TABLET | Freq: Every day | ORAL | Status: DC
  Administered 2022-07-20 – 2022-08-09 (×19): 1000 ug via ORAL
  Filled 2022-07-20 (×10): qty 1
  Filled 2022-07-20: qty 2
  Filled 2022-07-20 (×8): qty 1

## 2022-07-20 MED ORDER — ADULT MULTIVITAMIN W/MINERALS CH
1.0000 | ORAL_TABLET | Freq: Every day | ORAL | Status: DC
  Administered 2022-07-22 – 2022-08-09 (×18): 1 via ORAL
  Filled 2022-07-20 (×18): qty 1

## 2022-07-20 MED ORDER — AMLODIPINE BESYLATE 5 MG PO TABS
5.0000 mg | ORAL_TABLET | Freq: Every day | ORAL | Status: DC
  Administered 2022-07-21 – 2022-07-22 (×2): 5 mg via ORAL
  Filled 2022-07-20 (×2): qty 1

## 2022-07-20 MED ORDER — LABETALOL HCL 5 MG/ML IV SOLN
20.0000 mg | INTRAVENOUS | Status: DC | PRN
  Filled 2022-07-20: qty 4

## 2022-07-20 MED ORDER — ONDANSETRON HCL 4 MG/2ML IJ SOLN
4.0000 mg | INTRAMUSCULAR | Status: DC | PRN
  Administered 2022-07-20 – 2022-07-23 (×5): 4 mg via INTRAVENOUS
  Filled 2022-07-20 (×6): qty 2

## 2022-07-20 NOTE — ED Provider Notes (Signed)
[facility] Provider Note    Event Date/Time   First MD Initiated Contact with Patient 07/20/22 1500     (approximate)   History   Chief Complaint: Back Pain   HPI  Brett Riley is a 87 y.o. male with a past history of hypertension hyperlipidemia and CAD who comes ED complaining of acute onset of bilateral lower back pain that started this morning after getting out of bed.  Took 3 Tylenol without relief.  Never had pain like this before.  Denies a history of aortic aneurysm.  Had kidney stones about 4 years ago but that felt different.  Denies any abdominal pain vomiting diarrhea constipation or fever.  No chest pain or shortness of breath.     Physical Exam   Triage Vital Signs: ED Triage Vitals  Enc Vitals Group     BP 07/20/22 1452 (!) 159/73     Pulse Rate 07/20/22 1450 73     Resp 07/20/22 1450 19     Temp 07/20/22 1452 99.2 F (37.3 C)     Temp Source 07/20/22 1452 Oral     SpO2 07/20/22 1450 96 %     Weight 07/20/22 1446 197 lb (89.4 kg)     Height 07/20/22 1446 6' (1.829 m)     Head Circumference --      Peak Flow --      Pain Score 07/20/22 1445 8     Pain Loc --      Pain Edu? --      Excl. in GC? --     Most recent vital signs: Vitals:   07/20/22 1900 07/20/22 1915  BP: 139/70   Pulse: 68   Resp: 18   Temp:    SpO2:  93%    General: Awake, no distress.  CV:  Good peripheral perfusion.  Normal distal pulses.  Regular rate and rhythm Resp:  Normal effort.  Clear to auscultation bilaterally Abd:  No distention.  Soft nontender.  No CVA tenderness Other:  No midline spinal tenderness.  No reproducible tenderness in the lumbosacral musculature   ED Results / Procedures / Treatments   Labs (all labs ordered are listed, but only abnormal results are displayed) Labs Reviewed  COMPREHENSIVE METABOLIC PANEL - Abnormal; Notable for the following components:      Result Value   Glucose, Bld 121 (*)    BUN 25 (*)    All  other components within normal limits  CBC WITH DIFFERENTIAL/PLATELET - Abnormal; Notable for the following components:   RBC 4.06 (*)    Neutro Abs 8.6 (*)    All other components within normal limits  LIPASE, BLOOD     EKG    RADIOLOGY CT abdomen pelvis interpreted by me, negative for aneurysm or dissection.  Radiology report reviewed noting 10 mm x 7 mm atherosclerotic ulceration of the infrarenal aorta.   PROCEDURES:  Procedures   MEDICATIONS ORDERED IN ED: Medications  sodium chloride 0.9 % bolus 500 mL (0 mLs Intravenous Stopped 07/20/22 1901)  morphine (PF) 4 MG/ML injection 4 mg (4 mg Intravenous Given 07/20/22 1555)  ondansetron (ZOFRAN) injection 4 mg (4 mg Intravenous Given 07/20/22 1555)  iohexol (OMNIPAQUE) 350 MG/ML injection 100 mL (80 mLs Intravenous Contrast Given 07/20/22 1638)  morphine (PF) 4 MG/ML injection 4 mg (4 mg Intravenous Given 07/20/22 1927)     IMPRESSION / MDM / ASSESSMENT AND PLAN / ED COURSE  I reviewed the triage vital signs and  the nursing notes.  DDx: Aortic aneurysm, renal artery dissection, ureterolithiasis, urinary retention, AKI, pancreatitis  Patient's presentation is most consistent with acute presentation with potential threat to life or bodily function.  Patient presents with diffuse low back pain that started this morning, rated as severe.  Vital signs are normal, circulation is intact, not in distress.  Will give IV morphine and Zofran, obtain CT angiogram of the abdomen pelvis to evaluate for aortic/retroperitoneal pathology.   ----------------------------------------- 7:48 PM on 07/20/2022 ----------------------------------------- CT results reviewed.  Discussed with vascular surgery.  Since there is no other explanation for the patient's back pain, the aortic ulceration is suspected to be the culprit.  Dr. Evie Lacks recommends hospitalization for further monitoring and evaluation.  Discussed with hospitalist.      FINAL  CLINICAL IMPRESSION(S) / ED DIAGNOSES   Final diagnoses:  PVD (peripheral vascular disease) (HCC)  Atherosclerotic ulcer of aorta (HCC)     Rx / DC Orders   ED Discharge Orders     None        Note:  This document was prepared using Dragon voice recognition software and may include unintentional dictation errors.   Sharman Cheek, MD 07/20/22 [phone]

## 2022-07-20 NOTE — Assessment & Plan Note (Signed)
-   We will increase statin therapy dose.

## 2022-07-20 NOTE — Assessment & Plan Note (Addendum)
-   The patient will be admitted to a medical telemetry bed. - He has associated bilateral low back pain. - Pain management will be provided. - We will continue aspirin and Plavix. - We will place on high-dose statin. - Strict blood pressure control. - Vascular surgery consult to be obtained. - Dr. Evie Lacks was notified about the patient.

## 2022-07-20 NOTE — Assessment & Plan Note (Signed)
-   We will continue aspirin Plavix as well as statin therapy, ACE inhibitor therapy with lisinopril and beta-blocker therapy with Toprol-XL

## 2022-07-20 NOTE — Assessment & Plan Note (Signed)
-   The patient will be placed on his antihypertensives. - We will add as needed IV labetalol and hydralazine for adequate BP control.

## 2022-07-20 NOTE — ED Notes (Signed)
Pt assisted by paramedic and this RN to pivot from stretcher to ER stretcher.

## 2022-07-20 NOTE — H&P (Signed)
[place]   PATIENT NAME: Brett Riley    MR#:  [id number]  DATE OF BIRTH:  01/14/1936  DATE OF ADMISSION:  07/20/2022  PRIMARY CARE PHYSICIAN: Dale Springdale, MD   Patient is coming from: Home  REQUESTING/REFERRING PHYSICIAN: Alfonse Flavors, MD  CHIEF COMPLAINT:   Chief Complaint  Patient presents with   Back Pain    HISTORY OF PRESENT ILLNESS:  Brett Riley is a 87 y.o. Caucasian male with medical history significant for coronary artery disease, hypertension, dyslipidemia and urolithiasis, who presented to the ER with acute onset of bilateral low back pain that started this morning after he got out of bed.  He was barely able to stand per his wife.  He had nausea around  noon.  He took 3 Tylenol without significant relief.  He denied having any similar pain before.  His urolithiasis pain with kidney stones was different about 4 years ago.  No nausea or vomiting or diarrhea or melena or bright red bleeding per rectum.  No chest pain or palpitations.  No cough or wheezing or hemoptysis.  No dysuria, oliguria, hematuria, urgency or frequency or flank pain.  ED Course: When he came to the ER, BP was 159/73 with temperature of 99.2, respiratory to of 21 with otherwise normal vital signs.  Pulse symmetry went down to 88% and later on was 93% - 94%..  Labs revealed a BUN of 25 with otherwise unremarkable CMP.  CBC was within normal.  CTA of the abdomen and pelvis with and without contrast revealed the following EKG as reviewed by me : Pending Imaging: CT of the abdomen pelvis with and without contrast revealed the following: 1. 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 No aneurysm or dissection. 2. 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 3. Colonic diverticulosis without diverticulitis. 4. Small hiatal hernia.  The patient was given 4 mg of IV morphine sulfate  twice, 4 regular of IV Zofran and 500 mL IV normal saline bolus.  Dr. Evie Lacks with her surgery was notified about the patient is aware.  The patient will be admitted to a medical telemetry bed for further evaluation and management.   PAST MEDICAL HISTORY:   Past Medical History:  Diagnosis Date   Cancer (HCC)    skin cancer basal   Carotid arterial disease (HCC)    a. 02/2016 L CEA; b. 01/2017 U/S: patent LICA, 1-39% RICA.   Coronary artery disease    a. 01/2016 MV: mild apical/basal inferior, apical lateral, mid anterolateral, and mid inferolateral ischemia. EF 57%; b. 02/2016 Cath: LM 40ost, LAD 70p/m, 20d, D1 95 (small), LCX nl, RCA 101m, RPDA 90 (small), EF 55-65%-->med Rx. Rec CABG for recurrent symptoms.   History of echocardiogram    a. 02/2016 Echo: EF 50-55%, no rwma, mild MR.   History of kidney stones    Hypercholesterolemia    Hypertension     PAST SURGICAL HISTORY:   Past Surgical History:  Procedure Laterality Date   CARDIAC CATHETERIZATION N/A 01/19/2016   Procedure: Left Heart Cath and Coronary Angiography;  Surgeon: Iran Ouch, MD;  Location: [facility] INVASIVE CV LAB;  Service: Cardiovascular;  Laterality: N/A;   COLONOSCOPY     CYSTOSCOPY/URETEROSCOPY/HOLMIUM LASER/STENT PLACEMENT Right 04/13/2021   Procedure: CYSTOSCOPY/URETEROSCOPY/HOLMIUM LASER/STENT PLACEMENT;  Surgeon: Sondra Come, MD;  Location: [facility] ORS;  Service: Urology;  Laterality: Right;  ENDARTERECTOMY Left 02/15/2016   Procedure: ENDARTERECTOMY CAROTID;  Surgeon: Annice Needy, MD;  Location: [facility] ORS;  Service: Vascular;  Laterality: Left;    SOCIAL HISTORY:   Social History   Tobacco Use   Smoking status: Never   Smokeless tobacco: Never  Substance Use Topics   Alcohol use: No    Alcohol/week: 0.0 standard drinks of alcohol    FAMILY HISTORY:   Family History  Problem Relation Age of Onset   Stroke Mother    Heart disease Father        MI   Heart attack Father    Breast cancer  Sister    Colon cancer Neg Hx    Prostate cancer Neg Hx     DRUG ALLERGIES:  No Known Allergies  REVIEW OF SYSTEMS:   ROS As per history of present illness. All pertinent systems were reviewed above. Constitutional, HEENT, cardiovascular, respiratory, GI, GU, musculoskeletal, neuro, psychiatric, endocrine, integumentary and hematologic systems were reviewed and are otherwise negative/unremarkable except for positive findings mentioned above in the HPI.   MEDICATIONS AT HOME:   Prior to Admission medications   Medication Sig Start Date End Date Taking? Authorizing Provider  amLODipine (NORVASC) 5 MG tablet Take 1 tablet (5 mg total) by mouth daily. 04/05/22   Dale [place], MD  aspirin 81 MG tablet Take 81 mg by mouth at bedtime.     [provider]  clopidogrel (PLAVIX) 75 MG tablet TAKE 1 TABLET BY MOUTH EVERY DAY WITH BREAKFAST 02/14/22   Dale [place], MD  lisinopril (ZESTRIL) 40 MG tablet Take 1 tablet (40 mg total) by mouth daily. 02/14/22   Dale [place], MD  metoprolol succinate (TOPROL-XL) 100 MG 24 hr tablet TAKE 1 TABLET BY MOUTH EVERY DAY WITH A MEAL 11/21/21   Dale Webster, MD  Multiple Vitamins-Iron (MULTIVITAMINS WITH IRON) TABS Take 1 tablet by mouth daily.    [provider]  rosuvastatin (CRESTOR) 10 MG tablet Take 1 tablet (10 mg total) by mouth daily. 02/14/22   Dale Leota, MD  vitamin B-12 (CYANOCOBALAMIN) 1000 MCG tablet Take 1,000 mcg by mouth daily.    [provider]      VITAL SIGNS:  Blood pressure 139/70, pulse 68, temperature 99.2 F (37.3 C), temperature source Oral, resp. rate 18, height 6' (1.829 m), weight 89.4 kg, SpO2 93 %.  PHYSICAL EXAMINATION:  Physical Exam  GENERAL:  87 y.o.-year-old Caucasian male patient lying in the bed with no acute distress.  EYES: Pupils equal, round, reactive to light and accommodation. No scleral icterus. Extraocular muscles intact.  HEENT: Head atraumatic, normocephalic.  Oropharynx and nasopharynx clear.  NECK:  Supple, no jugular venous distention. No thyroid enlargement, no tenderness.  LUNGS: Normal breath sounds bilaterally, no wheezing, rales,rhonchi or crepitation. No use of accessory muscles of respiration.  CARDIOVASCULAR: Regular rate and rhythm, S1, S2 normal. No murmurs, rubs, or gallops.  ABDOMEN: Soft, nondistended, nontender. Bowel sounds present. No organomegaly or mass.  EXTREMITIES: No pedal edema, cyanosis, or clubbing.  NEUROLOGIC: Cranial nerves II through XII are intact. Muscle strength 5/5 in all extremities. Sensation intact. Gait not checked.  PSYCHIATRIC: The patient is alert and oriented x 3.  Normal affect and good eye contact. SKIN: No obvious rash, lesion, or ulcer.   LABORATORY PANEL:   CBC Recent Labs  Lab 07/20/22 1552  WBC 10.3  HGB 13.3  HCT 39.7  PLT 158   ------------------------------------------------------------------------------------------------------------------  Chemistries  Recent Labs  Lab 07/20/22 1552  NA  136  K 3.7  CL 104  CO2 24  GLUCOSE 121*  BUN 25*  CREATININE 0.99  CALCIUM 9.2  AST 38  ALT 25  ALKPHOS 72  BILITOT 0.9   ------------------------------------------------------------------------------------------------------------------  Cardiac Enzymes No results for input(s): "TROPONINI" in the last 168 hours. ------------------------------------------------------------------------------------------------------------------  RADIOLOGY:  CT Angio Abd/Pel W and/or Wo Contrast  Result Date: 07/20/2022 CLINICAL DATA:  Aneurysm, renal or visceral. AAA suspected. Bilateral low back pain. EXAM: CTA ABDOMEN AND PELVIS WITHOUT AND WITH CONTRAST TECHNIQUE: Multidetector CT imaging of the abdomen and pelvis was performed using the standard protocol during bolus administration of intravenous contrast. Multiplanar reconstructed images and MIPs were obtained and reviewed to evaluate the vascular  anatomy. RADIATION DOSE REDUCTION: This exam was performed according to the departmental dose-optimization program which includes automated exposure control, adjustment of the mA and/or kV according to patient size and/or use of iterative reconstruction technique. CONTRAST:  80mL OMNIPAQUE IOHEXOL 350 MG/ML SOLN COMPARISON:  CT 04/09/2021 FINDINGS: VASCULAR Aorta: Moderate mixed density atherosclerotic plaque in the abdominal aorta. 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 The aorta measures 27 mm in maximum dimension at this level. No aneurysm or dissection. Celiac: No aneurysm or dissection. Predominantly calcified plaque at the origin causes advanced narrowing. SMA: No aneurysm or dissection. Predominantly calcified plaque at the origin causes advanced narrowing. Additional calcified plaque in the proximal SMA causes moderate narrowing. Renals: No aneurysm or dissection. Calcified plaque at the origins of both renal arteries causes advanced narrowing bilaterally. Accessory left renal artery. IMA: Patent. Inflow: Scattered predominantly calcified atherosclerotic plaque causes multifocal areas of mild narrowing. No aneurysm or dissection. Proximal Outflow: No aneurysm, dissection, or occlusion. Veins: No obvious venous abnormality within the limitations of this arterial phase study. Review of the MIP images confirms the above findings. NON-VASCULAR Lower chest: Bibasilar scarring/atelectasis.  No acute abnormality. Hepatobiliary: Normal parenchymal attenuation and hepatic contour. Unremarkable gallbladder and biliary tree. Pancreas: Unremarkable. Spleen: Unremarkable. Adrenals/Urinary Tract: Stable adrenal glands. No urinary calculi or hydronephrosis. Unremarkable bladder. Stomach/Bowel: Normal caliber large and small bowel. Colonic diverticulosis without diverticulitis. Stomach is within  normal limits. Small hiatal hernia. The appendix is not definitively visualized. No secondary signs of appendicitis. Lymphatic: No lymphadenopathy. Reproductive: Unremarkable. Other: No free intraperitoneal fluid or air. Musculoskeletal: Thoracolumbar spondylosis. Intraosseous ganglion cyst as evaluated on MRI 05/25/2021 in the posterior left acetabulum. No acute osseous abnormality. IMPRESSION: 1. 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 No aneurysm or dissection. 2. 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 3. Colonic diverticulosis without diverticulitis. 4. Small hiatal hernia. Aortic Atherosclerosis (ICD10-I70.0). Electronically Signed   By: Minerva Fester M.D.   On: 07/20/2022 17:18      IMPRESSION AND PLAN:  Assessment and Plan: * Penetrating atherosclerotic ulcer of aorta (HCC) - The patient will be admitted to a medical telemetry bed. - He has associated bilateral low back pain. - Pain management will be provided. - We will continue aspirin and Plavix. - We will place on high-dose statin. - Strict blood pressure control. - Vascular surgery consult to be obtained. - Dr. Evie Lacks was notified about the patient.  Uncontrolled hypertension - The patient will be placed on his antihypertensives. - We will add as needed IV labetalol and hydralazine for adequate BP control.  Coronary  artery disease - We will continue aspirin Plavix as well as statin therapy, ACE inhibitor therapy with lisinopril and beta-blocker therapy with Toprol-XL  Dyslipidemia - We will increase statin therapy dose.    DVT prophylaxis: Lovenox.  Advanced Care Planning:  Code Status: full code.  Family Communication:  The plan of care was discussed in details with the patient (and family). I answered all questions. The patient agreed to proceed with the above mentioned plan. Further management will depend  upon hospital course. Disposition Plan: Back to previous home environment Consults called: Vascular surgery. All the records are reviewed and case discussed with ED provider.  Status is: Inpatient   At the time of the admission, it appears that the appropriate admission status for this patient is inpatient.  This is judged to be reasonable and necessary in order to provide the required intensity of service to ensure the patient's safety given the presenting symptoms, physical exam findings and initial radiographic and laboratory data in the context of comorbid conditions.  The patient requires inpatient status due to high intensity of service, high risk of further deterioration and high frequency of surveillance required.  I certify that at the time of admission, it is my clinical judgment that the patient will require inpatient hospital care extending more than 2 midnights.                            Dispo: The patient is from: Home              Anticipated d/c is to: Home              Patient currently is not medically stable to d/c.              Difficult to place patient: No  Hannah Beat M.D on 07/20/2022 at 8:24 PM  [facility]   From 7 PM-7 AM, contact night-coverage [email or web address]  CC: Primary care physician; Dale Sanborn, MD

## 2022-07-20 NOTE — ED Triage Notes (Signed)
Pt had bilateral lower back pain unrelieved with 3 regular Tylenol. 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

## 2022-07-20 NOTE — Assessment & Plan Note (Addendum)
-   We will check a portable chest x-ray and if normal check D-dimer. - The patient has not had any dyspnea or cough or wheezing. - He could be having an element of OSA. - O2 protocol will be followed. - If his D-dimer is elevated we will obtain a VQ scan and bilateral lower extremity venous Doppler.

## 2022-07-21 DIAGNOSIS — Z01810 Encounter for preprocedural cardiovascular examination: Secondary | ICD-10-CM | POA: Diagnosis not present

## 2022-07-21 DIAGNOSIS — I10 Essential (primary) hypertension: Secondary | ICD-10-CM | POA: Diagnosis not present

## 2022-07-21 DIAGNOSIS — I719 Aortic aneurysm of unspecified site, without rupture: Secondary | ICD-10-CM | POA: Diagnosis not present

## 2022-07-21 DIAGNOSIS — E785 Hyperlipidemia, unspecified: Secondary | ICD-10-CM

## 2022-07-21 DIAGNOSIS — I2510 Atherosclerotic heart disease of native coronary artery without angina pectoris: Secondary | ICD-10-CM | POA: Diagnosis not present

## 2022-07-21 DIAGNOSIS — I739 Peripheral vascular disease, unspecified: Secondary | ICD-10-CM

## 2022-07-21 LAB — CBC
HCT: 39.4 % (ref 39.0–52.0)
Hemoglobin: 13.2 g/dL (ref 13.0–17.0)
MCH: 32.6 pg (ref 26.0–34.0)
MCHC: 33.5 g/dL (ref 30.0–36.0)
MCV: 97.3 fL (ref 80.0–100.0)
Platelets: 145 10*3/uL — ABNORMAL LOW (ref 150–400)
RBC: 4.05 MIL/uL — ABNORMAL LOW (ref 4.22–5.81)
RDW: 13.2 % (ref 11.5–15.5)
WBC: 17.9 10*3/uL — ABNORMAL HIGH (ref 4.0–10.5)
nRBC: 0 % (ref 0.0–0.2)

## 2022-07-21 LAB — BASIC METABOLIC PANEL WITH GFR
Anion gap: 9 (ref 5–15)
BUN: 24 mg/dL — ABNORMAL HIGH (ref 8–23)
CO2: 23 mmol/L (ref 22–32)
Calcium: 9 mg/dL (ref 8.9–10.3)
Chloride: 105 mmol/L (ref 98–111)
Creatinine, Ser: 1.04 mg/dL (ref 0.61–1.24)
GFR, Estimated: >60 mL/min
Glucose, Bld: 128 mg/dL — ABNORMAL HIGH (ref 70–99)
Potassium: 3.9 mmol/L (ref 3.5–5.1)
Sodium: 137 mmol/L (ref 135–145)

## 2022-07-21 LAB — BRAIN NATRIURETIC PEPTIDE: B Natriuretic Peptide: 329.1 pg/mL — ABNORMAL HIGH (ref 0.0–100.0)

## 2022-07-21 MED ORDER — FUROSEMIDE 10 MG/ML IJ SOLN
20.0000 mg | Freq: Once | INTRAMUSCULAR | Status: AC
  Administered 2022-07-21: 20 mg via INTRAVENOUS
  Filled 2022-07-21: qty 4

## 2022-07-21 NOTE — Progress Notes (Addendum)
PROGRESS NOTE    Brett Riley   [name] DOB: 05-04-1935  DOA: 07/20/2022 Date of Service: 07/21/22 PCP: Dale Cornlea, MD     Brief Narrative / Hospital Course:  Brett Riley is a 87 y.o. Caucasian male with medical history significant for coronary artery disease, hypertension, dyslipidemia and urolithiasis, who presented to the ER with acute onset of bilateral low back pain that started 07/20/2022 in the morning after he got out of bed.  He was barely able to stand per his wife.  He had nausea around  noon. Came to ED.  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 No aneurysm or dissection. 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 Dr Evie Lacks w/ vascular surgery team was consulted by EDP - Since no other explanation for back pain, aortic ulceration is suspected to be the culprit. Dr. Evie Lacks recs hospitalization, pt admitted to hospitalist service.  05/19: Vasc surg planning endovascular intervention, recs for cardiology eval and Echo.   Consultants:  Vascular surgery Cardiology   Procedures: none      ASSESSMENT & PLAN:   Principal Problem:   Penetrating atherosclerotic ulcer of aorta (HCC) Active Problems:   Uncontrolled hypertension   Coronary artery disease   Hypoxemia   Dyslipidemia   Penetrating atherosclerotic ulcer of aorta (HCC) Pain management aspirin and Plavix. high-dose statin. Strict blood pressure control. Vascular surgery consult w/ Dr. Evie Lacks  Echocardiogram  Cardiology consulted for surgical risk stratification  Uncontrolled hypertension antihypertensives. as needed IV labetalol and hydralazine for adequate BP control.  Dyslipidemia increase statin therapy dose.  Coronary artery disease continue aspirin Plavix as well as statin therapy, ACE inhibitor with lisinopril and beta-blocker with  Toprol-XL  Hypoxemia - pt is not taking good breaths d/t pain  (+)D-Dimer likely d/t inflammation has not had any dyspnea or cough or wheezing, no chest pain, no pleuritic chest pain Defer PE w/u, [place] 0  Avoid excessive use contrast, pt also does not want to move for any tests d/t back pain     DVT prophylaxis: lovenox  Pertinent IV fluids/nutrition: CLD Central lines / invasive devices: none  Code Status: FULL CODE  ACP documentation reviewed: 07/21/22 none on file [facility]   Current Admission Status: inpatient   TOC needs / Dispo plan: TBD Barriers to discharge / significant pending items: pending vascular repair aortic ulcer             Subjective / Brief ROS:  Patient reports significant back pain, denies chest pain, no SOB, no pleuritic pain, no headache, no dizziness Denies new weakness.  Tolerating diet.  Reports no concerns w/ urination/defecation.   Family Communication: family at bedside on rounds     Objective Findings:  Vitals:   07/20/22 2107 07/21/22 0451 07/21/22 0901 07/21/22 1159  BP: (!) 184/85 (!) 152/71 (!) 151/72   Pulse: 83 75 79   Resp: 20 20 18    Temp: 99.4 F (37.4 C) 99.7 F (37.6 C) 99.3 F (37.4 C)   TempSrc: Oral Oral Oral   SpO2: 97% 92% 95% 94%  Weight: 88 kg     Height: 6' (1.829 m)       Intake/Output Summary (Last 24 hours) at 07/21/2022 1233 Last data filed at 07/21/2022 1020 Gross per 24 hour  Intake 788.6 ml  Output 375 ml  Net 413.6 ml   [employer]  07/20/22 1446 07/20/22 2107  Weight: 89.4 kg 88 kg    Examination:  Physical Exam Constitutional:      General: He is not in acute distress. Cardiovascular:     Rate and Rhythm: Normal rate and regular rhythm.     Heart sounds: Normal heart sounds.  Pulmonary:     Effort: Pulmonary effort is normal.     Breath sounds: Normal breath sounds.  Abdominal:     General: Abdomen is flat.     Palpations: Abdomen is soft.  Musculoskeletal:     Right lower  leg: No edema.     Left lower leg: No edema.  Neurological:     General: No focal deficit present.     Mental Status: He is alert.  Psychiatric:        Mood and Affect: Mood normal.        Behavior: Behavior normal.          Scheduled Medications:   amLODipine  5 mg Oral Daily   aspirin EC  81 mg Oral QHS   clopidogrel  75 mg Oral Daily   cyanocobalamin  1,000 mcg Oral Daily   enoxaparin (LOVENOX) injection  40 mg Subcutaneous Q24H   lisinopril  40 mg Oral Daily   metoprolol succinate  100 mg Oral Daily   multivitamin with minerals  1 tablet Oral Daily   rosuvastatin  40 mg Oral Daily    Continuous Infusions:  lactated ringers 75 mL/hr at 07/21/22 1151    PRN Medications:  acetaminophen **OR** acetaminophen, fentaNYL (SUBLIMAZE) injection, hydrALAZINE, labetalol, magnesium hydroxide, ondansetron (ZOFRAN) IV, traZODone  Antimicrobials from admission:  Anti-infectives (From admission, onward)    None           Data Reviewed:  I have personally reviewed the following...  CBC: Recent Labs  Lab 07/20/22 1552 07/21/22 0448  WBC 10.3 17.9*  NEUTROABS 8.6*  --   HGB 13.3 13.2  HCT 39.7 39.4  MCV 97.8 97.3  PLT 158 145*   Basic Metabolic Panel: Recent Labs  Lab 07/20/22 1552 07/21/22 0448  NA 136 137  K 3.7 3.9  CL 104 105  CO2 24 23  GLUCOSE 121* 128*  BUN 25* 24*  CREATININE 0.99 1.04  CALCIUM 9.2 9.0   GFR: Estimated Creatinine Clearance: 58 mL/min (by C-G formula based on SCr of 1.04 mg/dL). Liver Function Tests: Recent Labs  Lab 07/20/22 1552  AST 38  ALT 25  ALKPHOS 72  BILITOT 0.9  PROT 7.7  ALBUMIN 4.6   Recent Labs  Lab 07/20/22 1552  LIPASE 28   No results for input(s): "AMMONIA" in the last 168 hours. Coagulation Profile: No results for input(s): "INR", "PROTIME" in the last 168 hours. Cardiac Enzymes: No results for input(s): "CKTOTAL", "CKMB", "CKMBINDEX", "TROPONINI" in the last 168 hours. BNP (last 3 results) No  results for input(s): "PROBNP" in the last 8760 hours. HbA1C: No results for input(s): "HGBA1C" in the last 72 hours. CBG: No results for input(s): "GLUCAP" in the last 168 hours. Lipid Profile: No results for input(s): "CHOL", "HDL", "LDLCALC", "TRIG", "CHOLHDL", "LDLDIRECT" in the last 72 hours. Thyroid Function Tests: No results for input(s): "TSH", "T4TOTAL", "FREET4", "T3FREE", "THYROIDAB" in the last 72 hours. Anemia Panel: No results for input(s): "VITAMINB12", "FOLATE", "FERRITIN", "TIBC", "IRON", "RETICCTPCT" in the last 72 hours. Most Recent Urinalysis On File:     Component Value Date/Time   COLORURINE YELLOW (A) 04/10/2021 2343   APPEARANCEUR CLEAR (A) 04/10/2021 2343  LABSPEC 1.025 04/10/2021 2343   PHURINE 5.0 04/10/2021 2343   GLUCOSEU NEGATIVE 04/10/2021 2343   HGBUR MODERATE (A) 04/10/2021 2343   BILIRUBINUR NEGATIVE 04/10/2021 2343   KETONESUR NEGATIVE 04/10/2021 2343   PROTEINUR NEGATIVE 04/10/2021 2343   NITRITE NEGATIVE 04/10/2021 2343   LEUKOCYTESUR NEGATIVE 04/10/2021 2343   Sepsis Labs: @LABRCNTIP (procalcitonin:4,lacticidven:4) Microbiology: No results found for this or any previous visit (from the past 240 hour(s)).    Radiology Studies last 3 days: DG Chest Portable 1 View  Result Date: 07/20/2022 CLINICAL DATA:  Bilateral lower back pain EXAM: PORTABLE CHEST 1 VIEW COMPARISON:  Chest radiograph dated 09/19/2019 FINDINGS: Normal lung volumes. No focal consolidations. No pleural effusion or pneumothorax. The heart size and mediastinal contours are within normal limits. Dextrocurvature of the upper thoracic spine may be related to patient positioning. IMPRESSION: No active disease. Electronically Signed   By: Agustin Cree M.D.   On: 07/20/2022 21:29   CT Angio Abd/Pel W and/or Wo Contrast  Result Date: 07/20/2022 CLINICAL DATA:  Aneurysm, renal or visceral. AAA suspected. Bilateral low back pain. EXAM: CTA ABDOMEN AND PELVIS WITHOUT AND WITH CONTRAST  TECHNIQUE: Multidetector CT imaging of the abdomen and pelvis was performed using the standard protocol during bolus administration of intravenous contrast. Multiplanar reconstructed images and MIPs were obtained and reviewed to evaluate the vascular anatomy. RADIATION DOSE REDUCTION: This exam was performed according to the departmental dose-optimization program which includes automated exposure control, adjustment of the mA and/or kV according to patient size and/or use of iterative reconstruction technique. CONTRAST:  80mL OMNIPAQUE IOHEXOL 350 MG/ML SOLN COMPARISON:  CT 04/09/2021 FINDINGS: VASCULAR Aorta: Moderate mixed density atherosclerotic plaque in the abdominal aorta. 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 The aorta measures 27 mm in maximum dimension at this level. No aneurysm or dissection. Celiac: No aneurysm or dissection. Predominantly calcified plaque at the origin causes advanced narrowing. SMA: No aneurysm or dissection. Predominantly calcified plaque at the origin causes advanced narrowing. Additional calcified plaque in the proximal SMA causes moderate narrowing. Renals: No aneurysm or dissection. Calcified plaque at the origins of both renal arteries causes advanced narrowing bilaterally. Accessory left renal artery. IMA: Patent. Inflow: Scattered predominantly calcified atherosclerotic plaque causes multifocal areas of mild narrowing. No aneurysm or dissection. Proximal Outflow: No aneurysm, dissection, or occlusion. Veins: No obvious venous abnormality within the limitations of this arterial phase study. Review of the MIP images confirms the above findings. NON-VASCULAR Lower chest: Bibasilar scarring/atelectasis.  No acute abnormality. Hepatobiliary: Normal parenchymal attenuation and hepatic contour. Unremarkable gallbladder and biliary tree. Pancreas: Unremarkable.  Spleen: Unremarkable. Adrenals/Urinary Tract: Stable adrenal glands. No urinary calculi or hydronephrosis. Unremarkable bladder. Stomach/Bowel: Normal caliber large and small bowel. Colonic diverticulosis without diverticulitis. Stomach is within normal limits. Small hiatal hernia. The appendix is not definitively visualized. No secondary signs of appendicitis. Lymphatic: No lymphadenopathy. Reproductive: Unremarkable. Other: No free intraperitoneal fluid or air. Musculoskeletal: Thoracolumbar spondylosis. Intraosseous ganglion cyst as evaluated on MRI 05/25/2021 in the posterior left acetabulum. No acute osseous abnormality. IMPRESSION: 1. 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 No aneurysm or dissection. 2. 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 3. Colonic diverticulosis without diverticulitis. 4. Small hiatal hernia. Aortic Atherosclerosis (ICD10-I70.0). Electronically Signed   By: Minerva Fester M.D.   On:  07/20/2022 17:18             LOS: 1 day       Sunnie Nielsen, DO [facility] 07/21/2022, 12:33 PM    Dictation software may have been used to generate the above note. Typos may occur and escape review in typed/dictated notes. Please contact Dr Lyn Hollingshead directly for clarity if needed.  Staff may message me via secure chat in Epic  but this may not receive an immediate response,  please page me for urgent matters!  If 7PM-7AM, please contact night coverage [email or web address]

## 2022-07-21 NOTE — H&P (View-Only) (Signed)
[facility] Vascular Consult Note  MRN : [id number]  Brett Riley is a 86 y.o. (04/15/1935) male who presents with chief complaint of  Chief Complaint  Patient presents with   Back Pain  .  History of Present Illness: 86 yom with history of HTN, hyperlipidemia, CAD- h/o Cardiac Cath; known to service- s/p CEA in 2017, followed annually for Iliac aneurysm. Presents with acute low back pain that began yesterday. Denies abdominal or leg pain, denies back pain in the past- although known L/S Degenerative disc disease. Denies nausea, vomiting, denies weakness in extremities. Notes increased shortness of breath. Upon evaluation in ED- found to be hypertensive- SBP 190s; CTA revealed Abdominal aortic penetrating ulcer without stranding, aneurysmal degeneration or  extravasation. Patient states pain is waxing and waning but has not improved significantly.  Current Facility-Administered Medications  Medication Dose Route Frequency Provider Last Rate Last Admin   acetaminophen (TYLENOL) tablet 650 mg  650 mg Oral Q6H PRN Mansy, Jan A, MD       Or   acetaminophen (TYLENOL) suppository 650 mg  650 mg Rectal Q6H PRN Mansy, Jan A, MD       amLODipine (NORVASC) tablet 5 mg  5 mg Oral Daily Mansy, Jan A, MD       aspirin EC tablet 81 mg  81 mg Oral QHS Mansy, Jan A, MD   81 mg at 07/20/22 2114   clopidogrel (PLAVIX) tablet 75 mg  75 mg Oral Daily Mansy, Jan A, MD       cyanocobalamin (VITAMIN B12) tablet 1,000 mcg  1,000 mcg Oral Daily Mansy, Jan A, MD   1,000 mcg at 07/20/22 2041   enoxaparin (LOVENOX) injection 40 mg  40 mg Subcutaneous Q24H Mansy, Jan A, MD   40 mg at 07/20/22 2041   fentaNYL (SUBLIMAZE) injection 12.5 mcg  12.5 mcg Intravenous Q4H PRN Mansy, Jan A, MD   12.5 mcg at 07/21/22 0812   hydrALAZINE (APRESOLINE) injection 10 mg  10 mg Intravenous Q6H PRN Mansy, Jan A, MD       labetalol (NORMODYNE) injection 20 mg  20 mg Intravenous Q3H PRN Mansy, Jan A, MD        lactated ringers infusion   Intravenous Continuous Mansy, Jan A, MD 75 mL/hr at 07/21/22 0828 Infusion Verify at 07/21/22 0828   lisinopril (ZESTRIL) tablet 40 mg  40 mg Oral Daily Mansy, Jan A, MD       magnesium hydroxide (MILK OF MAGNESIA) suspension 30 mL  30 mL Oral Daily PRN Mansy, Jan A, MD       metoprolol succinate (TOPROL-XL) 24 hr tablet 100 mg  100 mg Oral Daily Mansy, Jan A, MD       multivitamin with minerals tablet 1 tablet  1 tablet Oral Daily Mansy, Jan A, MD       ondansetron (ZOFRAN) injection 4 mg  4 mg Intravenous Q4H PRN Mansy, Jan A, MD   4 mg at 07/20/22 2113   rosuvastatin (CRESTOR) tablet 40 mg  40 mg Oral Daily Mansy, Jan A, MD   40 mg at 07/20/22 2140   traZODone (DESYREL) tablet 25 mg  25 mg Oral QHS PRN Mansy, Jan A, MD        Past Medical History:  Diagnosis Date   Cancer (HCC)    skin cancer basal   Carotid arterial disease (HCC)    a. 02/2016 L CEA; b. 01/2017 U/S: patent LICA, 1-39% RICA.   Coronary artery   disease    a. 01/2016 MV: mild apical/basal inferior, apical lateral, mid anterolateral, and mid inferolateral ischemia. EF 57%; b. 02/2016 Cath: LM 40ost, LAD 70p/m, 20d, D1 95 (small), LCX nl, RCA 20m, RPDA 90 (small), EF 55-65%-->med Rx. Rec CABG for recurrent symptoms.   History of echocardiogram    a. 02/2016 Echo: EF 50-55%, no rwma, mild MR.   History of kidney stones    Hypercholesterolemia    Hypertension     Past Surgical History:  Procedure Laterality Date   CARDIAC CATHETERIZATION N/A 01/19/2016   Procedure: Left Heart Cath and Coronary Angiography;  Surgeon: Muhammad A Arida, MD;  Location: [facility]ASIVE [facility];  Service: Cardiovascular;  Laterality: N/A;   COLONOSCOPY     CYSTOSCOPY/URETEROSCOPY/HOLMIUM LASER/STENT PLACEMENT Right 04/13/2021   Procedure: CYSTOSCOPY/URETEROSCOPY/HOLMIUM LASER/STENT PLACEMENT;  Surgeon: Sninsky, Brian C, MD;  Location: [facility] ORS;  Service: Urology;  Laterality: Right;   ENDARTERECTOMY Left 02/15/2016    Procedure: ENDARTERECTOMY CAROTID;  Surgeon: Jason S Dew, MD;  Location: [facility] ORS;  Service: Vascular;  Laterality: Left;    Social History Social History   Tobacco Use   Smoking status: Never   Smokeless tobacco: Never  Vaping Use   Vaping Use: Never used  Substance Use Topics   Alcohol use: No    Alcohol/week: 0.0 standard drinks of alcohol   Drug use: No    Family History Family History  Problem Relation Age of Onset   Stroke Mother    Heart disease Father        MI   Heart attack Father    Breast cancer Sister    Colon cancer Neg Hx    Prostate cancer Neg Hx     No Known Allergies   REVIEW OF SYSTEMS (Negative unless checked)  Constitutional: []Weight loss  []Fever  []Chills Cardiac: []Chest pain   []Chest pressure   []Palpitations   []Shortness of breath when laying flat   []Shortness of breath at rest   []Shortness of breath with exertion. Vascular:  []Pain in legs with walking   []Pain in legs at rest   []Pain in legs when laying flat   []Claudication   []Pain in feet when walking  []Pain in feet at rest  []Pain in feet when laying flat   []History of DVT   []Phlebitis   []Swelling in legs   []Varicose veins   []Non-healing ulcers Pulmonary:   []Uses home oxygen   []Productive cough   []Hemoptysis   []Wheeze  []COPD   []Asthma Neurologic:  []Dizziness  []Blackouts   []Seizures   []History of stroke   []History of TIA  []Aphasia   []Temporary blindness   []Dysphagia   []Weakness or numbness in arms   []Weakness or numbness in legs Musculoskeletal:  []Arthritis   []Joint swelling   []Joint pain   [x]Low back pain Hematologic:  []Easy bruising  []Easy bleeding   []Hypercoagulable state   []Anemic  []Hepatitis Gastrointestinal:  []Blood in stool   []Vomiting blood  []Gastroesophageal reflux/heartburn   []Difficulty swallowing. Genitourinary:  []Chronic kidney disease   []Difficult urination  []Frequent urination  []Burning with urination   []Blood in urine Skin:   []Rashes   []Ulcers   []Wounds Psychological:  []History of anxiety   [] History of major depression.  Physical Examination  Vitals:   07/20/22 2024 07/20/22 2107 07/21/22 0451 07/21/22 0901  BP:  (!) 184/85 (!) 152/71 (!) 151/72  Pulse:  83 75 79  Resp:  20 20   18  Temp: 99.5 F (37.5 C) 99.4 F (37.4 C) 99.7 F (37.6 C) 99.3 F (37.4 C)  TempSrc: Oral Oral Oral Oral  SpO2:  97% 92% 95%  Weight:  88 kg    Height:  6' (1.829 m)     Body mass index is 26.31 kg/m. Gen:  WD/WN, NAD Head: [place]/AT, No temporalis wasting. Prominent temp pulse not noted. Neck: Trachea midline.  No JVD.  Pulmonary:  Good air movement, respirations not labored, equal bilaterally.  Cardiac: RRR, normal S1, S2. Vascular:  Vessel Right Left  Radial Palpable Palpable  Ulnar    Brachial    Carotid Palpable, without bruit Palpable, without bruit  Aorta Not palpable N/A  Femoral Palpable Palpable  Popliteal    PT    DP Palpable Palpable   Gastrointestinal: soft, non-tender/non-distended. No guarding/reflex.  Musculoskeletal: M/S 5/5 throughout.  Extremities without ischemic changes.  No deformity or atrophy. No edema. Neurologic: Sensation grossly intact in extremities.  Symmetrical.  Speech is fluent. Motor exam as listed above. Psychiatric: Judgment intact, Mood & affect appropriate for pt's clinical situation.      CBC Lab Results  Component Value Date   WBC 17.9 (H) 07/21/2022   HGB 13.2 07/21/2022   HCT 39.4 07/21/2022   MCV 97.3 07/21/2022   PLT 145 (L) 07/21/2022    BMET    Component Value Date/Time   NA 137 07/21/2022 0448   NA 141 03/24/2019 1046   K 3.9 07/21/2022 0448   CL 105 07/21/2022 0448   CO2 23 07/21/2022 0448   GLUCOSE 128 (H) 07/21/2022 0448   BUN 24 (H) 07/21/2022 0448   BUN 18 03/24/2019 1046   CREATININE 1.04 07/21/2022 0448   CALCIUM 9.0 07/21/2022 0448   GFRNONAA >60 07/21/2022 0448   GFRAA >60 09/19/2019 0935   Estimated Creatinine Clearance: 58 mL/min  (by C-G formula based on SCr of 1.04 mg/dL).  COAG Lab Results  Component Value Date   INR 1.08 02/13/2016   INR 0.97 01/18/2016    Radiology DG Chest Portable 1 View  Result Date: 07/20/2022 CLINICAL DATA:  Bilateral lower back pain EXAM: PORTABLE CHEST 1 VIEW COMPARISON:  Chest radiograph dated 09/19/2019 FINDINGS: Normal lung volumes. No focal consolidations. No pleural effusion or pneumothorax. The heart size and mediastinal contours are within normal limits. Dextrocurvature of the upper thoracic spine may be related to patient positioning. IMPRESSION: No active disease. Electronically Signed   By: Limin  Xu M.D.   On: 07/20/2022 21:29   CT Angio Abd/Pel W and/or Wo Contrast  Result Date: 07/20/2022 CLINICAL DATA:  Aneurysm, renal or visceral. AAA suspected. Bilateral low back pain. EXAM: CTA ABDOMEN AND PELVIS WITHOUT AND WITH CONTRAST TECHNIQUE: Multidetector CT imaging of the abdomen and pelvis was performed using the standard protocol during bolus administration of intravenous contrast. Multiplanar reconstructed images and MIPs were obtained and reviewed to evaluate the vascular anatomy. RADIATION DOSE REDUCTION: This exam was performed according to the departmental dose-optimization program which includes automated exposure control, adjustment of the mA and/or kV according to patient size and/or use of iterative reconstruction technique. CONTRAST:  80mL OMNIPAQUE IOHEXOL 350 MG/ML SOLN COMPARISON:  CT 04/09/2021 FINDINGS: VASCULAR Aorta: Moderate mixed density atherosclerotic plaque in the abdominal aorta. 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 The aorta measures 27 mm in maximum dimension at this level. No aneurysm or dissection. Celiac:   No aneurysm or dissection. Predominantly calcified plaque at the origin causes advanced narrowing. SMA: No aneurysm or dissection.  Predominantly calcified plaque at the origin causes advanced narrowing. Additional calcified plaque in the proximal SMA causes moderate narrowing. Renals: No aneurysm or dissection. Calcified plaque at the origins of both renal arteries causes advanced narrowing bilaterally. Accessory left renal artery. IMA: Patent. Inflow: Scattered predominantly calcified atherosclerotic plaque causes multifocal areas of mild narrowing. No aneurysm or dissection. Proximal Outflow: No aneurysm, dissection, or occlusion. Veins: No obvious venous abnormality within the limitations of this arterial phase study. Review of the MIP images confirms the above findings. NON-VASCULAR Lower chest: Bibasilar scarring/atelectasis.  No acute abnormality. Hepatobiliary: Normal parenchymal attenuation and hepatic contour. Unremarkable gallbladder and biliary tree. Pancreas: Unremarkable. Spleen: Unremarkable. Adrenals/Urinary Tract: Stable adrenal glands. No urinary calculi or hydronephrosis. Unremarkable bladder. Stomach/Bowel: Normal caliber large and small bowel. Colonic diverticulosis without diverticulitis. Stomach is within normal limits. Small hiatal hernia. The appendix is not definitively visualized. No secondary signs of appendicitis. Lymphatic: No lymphadenopathy. Reproductive: Unremarkable. Other: No free intraperitoneal fluid or air. Musculoskeletal: Thoracolumbar spondylosis. Intraosseous ganglion cyst as evaluated on MRI 05/25/2021 in the posterior left acetabulum. No acute osseous abnormality. IMPRESSION: 1. 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 No aneurysm or dissection. 2. 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 3. Colonic diverticulosis without diverticulitis. 4. Small hiatal hernia. Aortic Atherosclerosis (ICD10-I70.0). Electronically Signed   By: Tyler  Stutzman M.D.   On: 07/20/2022 17:18       Assessment/Plan 1. Penetrating Abdominal Aortic Ulcer- Possible etiology of current presentation and symptomology  Recommend endovascular intervention this admission  HTN- Control SBP 120-140s  Pain Control 2. Known moderate L2-5/S1 Degenerative disc disease  3. Will obtain ECHO/Cardiology consult prior to intervention    England Greb A, MD  07/21/2022 9:05 AM    This note was created with Dragon medical transcription system.  Any error is purely unintentional   

## 2022-07-21 NOTE — Consult Note (Signed)
Cardiology Consultation:   Patient ID: Brett Riley; [id number]; 06-21-1935   Admit date: 07/20/2022 Date of Consult: 07/21/2022  Primary Care Provider: Dale [place], MD Primary Cardiologist: Kirke Corin Primary Electrophysiologist:  None   Patient Profile:   Brett Riley is a 87 y.o. male with a hx of CAD, carotid artery disease status post left carotid endarterectomy, PAD, HTN, and HLD who is being seen today for preoperative cardiac risk stratification for vascular repair of penetrating aortic ulcer at the request of Dr. Evie Lacks.  History of Present Illness:   [name] was initially evaluated in 2017 for preoperative cardiac risk stratification for left carotid endarterectomy.  In that setting, he underwent nuclear stress test that was abnormal.  Subsequent LHC performed in 01/2016 showed moderate 40% ostial left main stenosis and 70% proximal and mid LAD stenosis.  He also has significant diagonal and RPDA disease.  However, both of these vessels were very small and medical therapy was recommended.  Echo at that time demonstrated an EF of 50 to 55%, normal wall motion, normal LV diastolic function parameters, and mild mitral regurgitation.  He has not required ischemic evaluation since.  With regards to his carotid artery disease and PAD, he is followed by vascular surgery with most recent carotid artery ultrasound from 01/2022 showing stable 1 to 39% right ICA stenosis with some progression of left ICA stenosis at 40 to 59%.  Vascular imaging did not show aneurysmal degeneration of the iliac arteries.  Largest aortic measurement was 2.5 cm.  He was last seen in our office in 04/2022 for routine follow-up of his CAD and was without symptoms of angina or cardiac decompensation.  His dose of amlodipine had recently been increased due to elevated BP readings.  He was admitted to [facility] on 07/20/2022 after presenting with acute onset of bilateral low back pain that occurred after he got out of bed.   In the ED, BP was 159/73 with a temperature of 99.2 and respirations of 21 with oxygen saturations briefly dropping into the upper 80s on room air, currently on supplemental oxygen via nasal cannula at 3 L.  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  No aneurysm or dissection.  There was advanced narrowing at the origins of the celiac axis, SMA, and bilateral renal arteries.  Other incidental findings include colonic diverticulosis without diverticulitis and aortic atherosclerosis.  Chest x-ray showed no active disease.  Blood pressure has remained in the 130s to 180s systolic with most readings in the 150s systolic.  He has received lactated Ringer's, morphine, and fentanyl for pain.  He has been evaluated by vascular surgery with recommendation for endovascular intervention this admission.  Currently, he denies any chest pain, dyspnea, palpitations, dizziness, presyncope, or syncope. He continues to have back pain despite pain medication. Also with ongoing nausea. He is able to achieve > 4 METs without cardiac limitation. Reports earlier this week, he helped lift a dishwasher without chest pain.     Past Medical History:  Diagnosis Date   Cancer [facility])    skin cancer basal   Carotid arterial disease (HCC)    a. 02/2016 L CEA; b. 01/2017 U/S: patent LICA, 1-39% RICA.   Coronary artery disease    a. 01/2016 MV: mild apical/basal inferior, apical lateral, mid anterolateral, and mid inferolateral ischemia. EF 57%; b. 02/2016 Cath: LM 40ost, LAD 70p/m, 20d, D1 95 (small), LCX nl,  RCA 23m, RPDA 90 (small), EF 55-65%-->med Rx. Rec CABG for recurrent symptoms.   History of echocardiogram    a. 02/2016 Echo: EF 50-55%, no rwma, mild MR.   History of kidney stones    Hypercholesterolemia    Hypertension     Past Surgical History:  Procedure Laterality Date   CARDIAC CATHETERIZATION N/A 01/19/2016   Procedure:  Left Heart Cath and Coronary Angiography;  Surgeon: Iran Ouch, MD;  Location: [facility] INVASIVE CV LAB;  Service: Cardiovascular;  Laterality: N/A;   COLONOSCOPY     CYSTOSCOPY/URETEROSCOPY/HOLMIUM LASER/STENT PLACEMENT Right 04/13/2021   Procedure: CYSTOSCOPY/URETEROSCOPY/HOLMIUM LASER/STENT PLACEMENT;  Surgeon: Sondra Come, MD;  Location: [facility] ORS;  Service: Urology;  Laterality: Right;   ENDARTERECTOMY Left 02/15/2016   Procedure: ENDARTERECTOMY CAROTID;  Surgeon: Annice Needy, MD;  Location: [facility] ORS;  Service: Vascular;  Laterality: Left;     Home Meds: Prior to Admission medications   Medication Sig Start Date End Date Taking? Authorizing Provider  amLODipine (NORVASC) 5 MG tablet Take 1 tablet (5 mg total) by mouth daily. 04/05/22   Dale [place], MD  aspirin 81 MG tablet Take 81 mg by mouth at bedtime.     [provider]  clopidogrel (PLAVIX) 75 MG tablet TAKE 1 TABLET BY MOUTH EVERY DAY WITH BREAKFAST 02/14/22   Dale [place], MD  lisinopril (ZESTRIL) 40 MG tablet Take 1 tablet (40 mg total) by mouth daily. 02/14/22   Dale [place], MD  metoprolol succinate (TOPROL-XL) 100 MG 24 hr tablet TAKE 1 TABLET BY MOUTH EVERY DAY WITH A MEAL 11/21/21   Dale [place], MD  Multiple Vitamins-Iron (MULTIVITAMINS WITH IRON) TABS Take 1 tablet by mouth daily.    [provider]  rosuvastatin (CRESTOR) 10 MG tablet Take 1 tablet (10 mg total) by mouth daily. 02/14/22   Dale [place], MD  vitamin B-12 (CYANOCOBALAMIN) 1000 MCG tablet Take 1,000 mcg by mouth daily.    [provider]    Inpatient Medications: Scheduled Meds:  amLODipine  5 mg Oral Daily   aspirin EC  81 mg Oral QHS   clopidogrel  75 mg Oral Daily   cyanocobalamin  1,000 mcg Oral Daily   enoxaparin (LOVENOX) injection  40 mg Subcutaneous Q24H   lisinopril  40 mg Oral Daily   metoprolol succinate  100 mg Oral Daily   multivitamin with minerals  1 tablet Oral Daily   rosuvastatin  40 mg  Oral Daily   Continuous Infusions:  lactated ringers 75 mL/hr at 07/21/22 0828   PRN Meds: acetaminophen **OR** acetaminophen, fentaNYL (SUBLIMAZE) injection, hydrALAZINE, labetalol, magnesium hydroxide, ondansetron (ZOFRAN) IV, traZODone  Allergies:  No Known Allergies  Social History:   Social History   Socioeconomic History   Marital status: Married    Spouse name: Not on file   Number of children: 1   Years of education: Not on file   Highest education level: Not on file  Occupational History    Employer: [employer]  Tobacco Use   Smoking status: Never   Smokeless tobacco: Never  Vaping Use   Vaping Use: Never used  Substance and Sexual Activity   Alcohol use: No    Alcohol/week: 0.0 standard drinks of alcohol   Drug use: No   Sexual activity: Not Currently  Other Topics Concern   Not on file  Social History Narrative   Not on file   Social Determinants of Health   Financial Resource Strain: Low Risk  (08/29/2020)   Overall  Financial Resource Strain (CARDIA)    Difficulty of Paying Living Expenses: Not hard at all  Food Insecurity: No Food Insecurity (07/20/2022)   Hunger Vital Sign    Worried About Running Out of Food in the Last Year: Never true    Ran Out of Food in the Last Year: Never true  Transportation Needs: No Transportation Needs (07/20/2022)   PRAPARE - Administrator, Civil Service (Medical): No    Lack of Transportation (Non-Medical): No  Physical Activity: Insufficiently Active (08/29/2020)   Exercise Vital Sign    Days of Exercise per Week: 3 days    Minutes of Exercise per Session: 20 min  Stress: No Stress Concern Present (08/29/2020)   [employer] of Occupational Health - Occupational Stress Questionnaire    Feeling of Stress : Not at all  Social Connections: Unknown (08/29/2020)   Social Connection and Isolation Panel [NHANES]    Frequency of Communication with Friends and Family: Not on file    Frequency of Social  Gatherings with Friends and Family: Not on file    Attends Religious Services: Not on file    Active Member of Clubs or Organizations: Not on file    Attends Banker Meetings: Not on file    Marital Status: Married  Intimate Partner Violence: Not At Risk (07/20/2022)   Humiliation, Afraid, Rape, and Kick questionnaire    Fear of Current or Ex-Partner: No    Emotionally Abused: No    Physically Abused: No    Sexually Abused: No     Family History:   Family History  Problem Relation Age of Onset   Stroke Mother    Heart disease Father        MI   Heart attack Father    Breast cancer Sister    Colon cancer Neg Hx    Prostate cancer Neg Hx     ROS:  Review of Systems  Constitutional:  Negative for chills, diaphoresis, fever, malaise/fatigue and weight loss.  HENT:  Negative for congestion.   Eyes:  Negative for discharge and redness.  Respiratory:  Negative for cough, sputum production, shortness of breath and wheezing.   Cardiovascular:  Negative for chest pain, palpitations, orthopnea, claudication, leg swelling and PND.  Gastrointestinal:  Positive for nausea and vomiting. Negative for abdominal pain and heartburn.  Musculoskeletal:  Positive for back pain. Negative for falls and myalgias.  Skin:  Negative for rash.  Neurological:  Negative for dizziness, tingling, tremors, sensory change, speech change, focal weakness, loss of consciousness and weakness.  Endo/Heme/Allergies:  Does not bruise/bleed easily.  Psychiatric/Behavioral:  Negative for substance abuse. The patient is not nervous/anxious.   All other systems reviewed and are negative.     Physical Exam/Data:   Vitals:   07/20/22 2024 07/20/22 2107 07/21/22 0451 07/21/22 0901  BP:  (!) 184/85 (!) 152/71 (!) 151/72  Pulse:  83 75 79  Resp:  20 20 18   Temp: 99.5 F (37.5 C) 99.4 F (37.4 C) 99.7 F (37.6 C) 99.3 F (37.4 C)  TempSrc: Oral Oral Oral Oral  SpO2:  97% 92% 95%  Weight:  88 kg     Height:  6' (1.829 m)      Intake/Output Summary (Last 24 hours) at 07/21/2022 0925 Last data filed at 07/21/2022 0828 Gross per 24 hour  Intake 788.6 ml  Output 375 ml  Net 413.6 ml   Filed Weights   07/20/22 1446 07/20/22 2107  Weight: 89.4 kg  88 kg   Body mass index is 26.31 kg/m.   Physical Exam: General: Well developed, well nourished, in no acute distress. Head: Normocephalic, atraumatic, sclera non-icteric, no xanthomas, nares without discharge.  Neck: Negative for carotid bruits. JVD not elevated. Lungs: Clear bilaterally to auscultation without wheezes, rales, or rhonchi. Breathing is unlabored. Heart: RRR, extra systoles with S1 S2. No murmurs, rubs, or gallops appreciated. Abdomen: Soft, non-tender, non-distended with normoactive bowel sounds. No hepatomegaly. No rebound/guarding. No obvious abdominal masses. Msk:  Strength and tone appear normal for age. Extremities: No clubbing or cyanosis. No edema. Distal pedal pulses are 2+ and equal bilaterally. Neuro: Alert and oriented X 3. No facial asymmetry. No focal deficit. Moves all extremities spontaneously. Psych:  Responds to questions appropriately with a normal affect.   EKG:  The EKG was personally reviewed and demonstrates: Not yet performed Telemetry:  Telemetry was personally reviewed and demonstrates: SR with frequent PACs  Weights: Filed Weights   07/20/22 1446 07/20/22 2107  Weight: 89.4 kg 88 kg    Relevant CV Studies:  2D echo 02/08/2016: - Left ventricle: The cavity size was normal. Wall thickness was    normal. Systolic function was normal. The estimated ejection    fraction was in the range of 50% to 55%. Wall motion was normal;    there were no regional wall motion abnormalities. Left    ventricular diastolic function parameters were normal for the    patient&'s age.  - Mitral valve: There was mild regurgitation. __________  LHC 01/19/2016: The left ventricular systolic function is  normal. LV end diastolic pressure is mildly elevated. The left ventricular ejection fraction is 55-65% by visual estimate. Mid RCA lesion, 20 %stenosed. RPDA lesion, 90 %stenosed. Ost LM to LM lesion, 40 %stenosed. Dist LAD lesion, 20 %stenosed. 1st Diag lesion, 95 %stenosed. Prox LAD to Mid LAD lesion, 70 %stenosed.   1. Significant 2 vessel coronary artery disease. Severe disease involving the mid to distal right PDA and first diagonal. These are not optimal for PCI and risks probably outweigh the benefit. There is moderate left main stenosis and borderline significant disease involving the proximal to mid LAD which is heavily calcified. 2. Normal LV systolic function and mildly elevated left ventricular end-diastolic pressure.   Recommendations: In the absence of clear anginal symptoms, I recommend aggressive medical therapy. If the patient becomes symptomatic, CABG can be considered given that he has diffuse heavily calcified disease. In terms of preoperative cardiovascular risk, he is considered at moderate risk for carotid endarterectomy. Consider switching to a more potent statin and recommend aggressive treatment of risk factors.  Laboratory Data:  Chemistry Recent Labs  Lab 07/20/22 1552 07/21/22 0448  NA 136 137  K 3.7 3.9  CL 104 105  CO2 24 23  GLUCOSE 121* 128*  BUN 25* 24*  CREATININE 0.99 1.04  CALCIUM 9.2 9.0  GFRNONAA >60 >60  ANIONGAP 8 9    Recent Labs  Lab 07/20/22 1552  PROT 7.7  ALBUMIN 4.6  AST 38  ALT 25  ALKPHOS 72  BILITOT 0.9   Hematology Recent Labs  Lab 07/20/22 1552 07/21/22 0448  WBC 10.3 17.9*  RBC 4.06* 4.05*  HGB 13.3 13.2  HCT 39.7 39.4  MCV 97.8 97.3  MCH 32.8 32.6  MCHC 33.5 33.5  RDW 13.1 13.2  PLT 158 145*   Cardiac EnzymesNo results for input(s): "TROPONINI" in the last 168 hours. No results for input(s): "TROPIPOC" in the last 168 hours.  BNPNo  results for input(s): "BNP", "PROBNP" in the last 168 hours.  DDimer   Recent Labs  Lab 07/20/22 2233  DDIMER 2.03*    Radiology/Studies:  DG Chest Portable 1 View  Result Date: 07/20/2022 IMPRESSION: No active disease. Electronically Signed   By: Agustin Cree M.D.   On: 07/20/2022 21:29   CT Angio Abd/Pel W and/or Wo Contrast  Result Date: 07/20/2022 IMPRESSION: 1. 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 No aneurysm or dissection. 2. 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 3. Colonic diverticulosis without diverticulitis. 4. Small hiatal hernia. Aortic Atherosclerosis (ICD10-I70.0). Electronically Signed   By: Minerva Fester M.D.   On: 07/20/2022 17:18    Assessment and Plan:   1.  Preoperative cardiac risk stratification for endovascular repair of penetrating aortic ulcer: -Vascular surgery has evaluated the patient and recommends endovascular intervention this admission -No symptoms of angina or cardiac decompensation -Per RCRI, the patient is low risk for noncardiac surgery with an estimated rate of 0.9% for adverse cardiac event in the perioperative timeframe -Per [facility] Activity Status Index, the patient can achieve > 4 METs without cardiac limitation -Echo is pending -EKG pending -If echo demonstrates a stable LV systolic function and no new wall motion abnormalities, patient would be able to proceed with endovascular repair without further cardiac intervention  -Ongoing management per vascular surgery  2.  CAD involving the native coronary arteries without angina: -He is currently on supplemental oxygen at 3 L due ot hypoxia noted in the ED, denies angina or dyspnea -CXR without acute cardiopulmonary process -D dimer elevated, will defer further evaluation to IM -Echo pending -Add on BNP -Appears euvolemic  -DAPT has been continued upon admission with aspirin and clopidogrel -Echo pending  3.  HTN: -Blood pressure remains  elevated -He is due to receive metoprolol, lisinopril, and amlodipine this morning -Titrate antihypertensive therapy as indicated  4.  HLD: -LDL 64 -PTA rosuvastatin  5.  Carotid artery disease and common iliac artery aneurysm: -Managed by vascular surgery      For questions or updates, please contact [facility] Please consult [email or web address] for contact info under Cardiology/STEMI.   Signed, Eula Listen, PA-C [facility] Pager: [phone] 07/21/2022, 9:25 AM

## 2022-07-21 NOTE — Consult Note (Signed)
[facility] Vascular Consult Note  MRN : [id number]  Brett Riley is a 87 y.o. (02-16-1936) male who presents with chief complaint of  Chief Complaint  Patient presents with   Back Pain  .  History of Present Illness: 40 yom with history of HTN, hyperlipidemia, CAD- h/o Cardiac Cath; known to service- s/p CEA in 2017, followed annually for Iliac aneurysm. Presents with acute low back pain that began yesterday. Denies abdominal or leg pain, denies back pain in the past- although known L/S Degenerative disc disease. Denies nausea, vomiting, denies weakness in extremities. Notes increased shortness of breath. Upon evaluation in ED- found to be hypertensive- SBP 190s; CTA revealed Abdominal aortic penetrating ulcer without stranding, aneurysmal degeneration or  extravasation. Patient states pain is waxing and waning but has not improved significantly.  Current Facility-Administered Medications  Medication Dose Route Frequency Provider Last Rate Last Admin   acetaminophen (TYLENOL) tablet 650 mg  650 mg Oral Q6H PRN Mansy, Jan A, MD       Or   acetaminophen (TYLENOL) suppository 650 mg  650 mg Rectal Q6H PRN Mansy, Jan A, MD       amLODipine (NORVASC) tablet 5 mg  5 mg Oral Daily Mansy, Jan A, MD       aspirin EC tablet 81 mg  81 mg Oral QHS Mansy, Jan A, MD   81 mg at 07/20/22 2114   clopidogrel (PLAVIX) tablet 75 mg  75 mg Oral Daily Mansy, Jan A, MD       cyanocobalamin (VITAMIN B12) tablet 1,000 mcg  1,000 mcg Oral Daily Mansy, Jan A, MD   1,000 mcg at 07/20/22 2041   enoxaparin (LOVENOX) injection 40 mg  40 mg Subcutaneous Q24H Mansy, Jan A, MD   40 mg at 07/20/22 2041   fentaNYL (SUBLIMAZE) injection 12.5 mcg  12.5 mcg Intravenous Q4H PRN Mansy, Jan A, MD   12.5 mcg at 07/21/22 4098   hydrALAZINE (APRESOLINE) injection 10 mg  10 mg Intravenous Q6H PRN Mansy, Jan A, MD       labetalol (NORMODYNE) injection 20 mg  20 mg Intravenous Q3H PRN Mansy, Jan A, MD        lactated ringers infusion   Intravenous Continuous Mansy, Jan A, MD 75 mL/hr at 07/21/22 1191 Infusion Verify at 07/21/22 0828   lisinopril (ZESTRIL) tablet 40 mg  40 mg Oral Daily Mansy, Jan A, MD       magnesium hydroxide (MILK OF MAGNESIA) suspension 30 mL  30 mL Oral Daily PRN Mansy, Jan A, MD       metoprolol succinate (TOPROL-XL) 24 hr tablet 100 mg  100 mg Oral Daily Mansy, Jan A, MD       multivitamin with minerals tablet 1 tablet  1 tablet Oral Daily Mansy, Jan A, MD       ondansetron [facility]) injection 4 mg  4 mg Intravenous Q4H PRN Mansy, Jan A, MD   4 mg at 07/20/22 2113   rosuvastatin (CRESTOR) tablet 40 mg  40 mg Oral Daily Mansy, Jan A, MD   40 mg at 07/20/22 2140   traZODone (DESYREL) tablet 25 mg  25 mg Oral QHS PRN Mansy, Jan A, MD        Past Medical History:  Diagnosis Date   Cancer [facility])    skin cancer basal   Carotid arterial disease (HCC)    a. 02/2016 L CEA; b. 01/2017 U/S: patent LICA, 1-39% RICA.   Coronary artery  disease    a. 01/2016 MV: mild apical/basal inferior, apical lateral, mid anterolateral, and mid inferolateral ischemia. EF 57%; b. 02/2016 Cath: LM 40ost, LAD 70p/m, 20d, D1 95 (small), LCX nl, RCA 10m, RPDA 90 (small), EF 55-65%-->med Rx. Rec CABG for recurrent symptoms.   History of echocardiogram    a. 02/2016 Echo: EF 50-55%, no rwma, mild MR.   History of kidney stones    Hypercholesterolemia    Hypertension     Past Surgical History:  Procedure Laterality Date   CARDIAC CATHETERIZATION N/A 01/19/2016   Procedure: Left Heart Cath and Coronary Angiography;  Surgeon: Iran Ouch, MD;  Location: [facility] INVASIVE CV LAB;  Service: Cardiovascular;  Laterality: N/A;   COLONOSCOPY     CYSTOSCOPY/URETEROSCOPY/HOLMIUM LASER/STENT PLACEMENT Right 04/13/2021   Procedure: CYSTOSCOPY/URETEROSCOPY/HOLMIUM LASER/STENT PLACEMENT;  Surgeon: Sondra Come, MD;  Location: [facility] ORS;  Service: Urology;  Laterality: Right;   ENDARTERECTOMY Left 02/15/2016    Procedure: ENDARTERECTOMY CAROTID;  Surgeon: Annice Needy, MD;  Location: [facility] ORS;  Service: Vascular;  Laterality: Left;    Social History Social History   Tobacco Use   Smoking status: Never   Smokeless tobacco: Never  Vaping Use   Vaping Use: Never used  Substance Use Topics   Alcohol use: No    Alcohol/week: 0.0 standard drinks of alcohol   Drug use: No    Family History Family History  Problem Relation Age of Onset   Stroke Mother    Heart disease Father        MI   Heart attack Father    Breast cancer Sister    Colon cancer Neg Hx    Prostate cancer Neg Hx     No Known Allergies   REVIEW OF SYSTEMS (Negative unless checked)  Constitutional: [] Weight loss  [] Fever  [] Chills Cardiac: [] Chest pain   [] Chest pressure   [] Palpitations   [] Shortness of breath when laying flat   [] Shortness of breath at rest   [] Shortness of breath with exertion. Vascular:  [] Pain in legs with walking   [] Pain in legs at rest   [] Pain in legs when laying flat   [] Claudication   [] Pain in feet when walking  [] Pain in feet at rest  [] Pain in feet when laying flat   [] History of DVT   [] Phlebitis   [] Swelling in legs   [] Varicose veins   [] Non-healing ulcers Pulmonary:   [] Uses home oxygen   [] Productive cough   [] Hemoptysis   [] Wheeze  [] COPD   [] Asthma Neurologic:  [] Dizziness  [] Blackouts   [] Seizures   [] History of stroke   [] History of TIA  [] Aphasia   [] Temporary blindness   [] Dysphagia   [] Weakness or numbness in arms   [] Weakness or numbness in legs Musculoskeletal:  [] Arthritis   [] Joint swelling   [] Joint pain   [x] Low back pain Hematologic:  [] Easy bruising  [] Easy bleeding   [] Hypercoagulable state   [] Anemic  [] Hepatitis Gastrointestinal:  [] Blood in stool   [] Vomiting blood  [] Gastroesophageal reflux/heartburn   [] Difficulty swallowing. Genitourinary:  [] Chronic kidney disease   [] Difficult urination  [] Frequent urination  [] Burning with urination   [] Blood in urine Skin:   [] Rashes   [] Ulcers   [] Wounds Psychological:  [] History of anxiety   []  History of major depression.  Physical Examination  Vitals:   07/20/22 2024 07/20/22 2107 07/21/22 0451 07/21/22 0901  BP:  (!) 184/85 (!) 152/71 (!) 151/72  Pulse:  83 75 79  Resp:  20 20  18  Temp: 99.5 F (37.5 C) 99.4 F (37.4 C) 99.7 F (37.6 C) 99.3 F (37.4 C)  TempSrc: Oral Oral Oral Oral  SpO2:  97% 92% 95%  Weight:  88 kg    Height:  6' (1.829 m)     Body mass index is 26.31 kg/m. Gen:  WD/WN, NAD Head: [place]/AT, No temporalis wasting. Prominent temp pulse not noted. Neck: Trachea midline.  No JVD.  Pulmonary:  Good air movement, respirations not labored, equal bilaterally.  Cardiac: RRR, normal S1, S2. Vascular:  Vessel Right Left  Radial Palpable Palpable  Ulnar    Brachial    Carotid Palpable, without bruit Palpable, without bruit  Aorta Not palpable N/A  Femoral Palpable Palpable  Popliteal    PT    DP Palpable Palpable   Gastrointestinal: soft, non-tender/non-distended. No guarding/reflex.  Musculoskeletal: M/S 5/5 throughout.  Extremities without ischemic changes.  No deformity or atrophy. No edema. Neurologic: Sensation grossly intact in extremities.  Symmetrical.  Speech is fluent. Motor exam as listed above. Psychiatric: Judgment intact, Mood & affect appropriate for pt's clinical situation.      CBC Lab Results  Component Value Date   WBC 17.9 (H) 07/21/2022   HGB 13.2 07/21/2022   HCT 39.4 07/21/2022   MCV 97.3 07/21/2022   PLT 145 (L) 07/21/2022    BMET    Component Value Date/Time   NA 137 07/21/2022 0448   NA 141 03/24/2019 1046   K 3.9 07/21/2022 0448   CL 105 07/21/2022 0448   CO2 23 07/21/2022 0448   GLUCOSE 128 (H) 07/21/2022 0448   BUN 24 (H) 07/21/2022 0448   BUN 18 03/24/2019 1046   CREATININE 1.04 07/21/2022 0448   CALCIUM 9.0 07/21/2022 0448   GFRNONAA >60 07/21/2022 0448   GFRAA >60 09/19/2019 0935   Estimated Creatinine Clearance: 58 mL/min  (by C-G formula based on SCr of 1.04 mg/dL).  COAG Lab Results  Component Value Date   INR 1.08 02/13/2016   INR 0.97 01/18/2016    Radiology DG Chest Portable 1 View  Result Date: 07/20/2022 CLINICAL DATA:  Bilateral lower back pain EXAM: PORTABLE CHEST 1 VIEW COMPARISON:  Chest radiograph dated 09/19/2019 FINDINGS: Normal lung volumes. No focal consolidations. No pleural effusion or pneumothorax. The heart size and mediastinal contours are within normal limits. Dextrocurvature of the upper thoracic spine may be related to patient positioning. IMPRESSION: No active disease. Electronically Signed   By: Agustin Cree M.D.   On: 07/20/2022 21:29   CT Angio Abd/Pel W and/or Wo Contrast  Result Date: 07/20/2022 CLINICAL DATA:  Aneurysm, renal or visceral. AAA suspected. Bilateral low back pain. EXAM: CTA ABDOMEN AND PELVIS WITHOUT AND WITH CONTRAST TECHNIQUE: Multidetector CT imaging of the abdomen and pelvis was performed using the standard protocol during bolus administration of intravenous contrast. Multiplanar reconstructed images and MIPs were obtained and reviewed to evaluate the vascular anatomy. RADIATION DOSE REDUCTION: This exam was performed according to the departmental dose-optimization program which includes automated exposure control, adjustment of the mA and/or kV according to patient size and/or use of iterative reconstruction technique. CONTRAST:  80mL OMNIPAQUE IOHEXOL 350 MG/ML SOLN COMPARISON:  CT 04/09/2021 FINDINGS: VASCULAR Aorta: Moderate mixed density atherosclerotic plaque in the abdominal aorta. 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 The aorta measures 27 mm in maximum dimension at this level. No aneurysm or dissection. Celiac:  No aneurysm or dissection. Predominantly calcified plaque at the origin causes advanced narrowing. SMA: No aneurysm or dissection.  Predominantly calcified plaque at the origin causes advanced narrowing. Additional calcified plaque in the proximal SMA causes moderate narrowing. Renals: No aneurysm or dissection. Calcified plaque at the origins of both renal arteries causes advanced narrowing bilaterally. Accessory left renal artery. IMA: Patent. Inflow: Scattered predominantly calcified atherosclerotic plaque causes multifocal areas of mild narrowing. No aneurysm or dissection. Proximal Outflow: No aneurysm, dissection, or occlusion. Veins: No obvious venous abnormality within the limitations of this arterial phase study. Review of the MIP images confirms the above findings. NON-VASCULAR Lower chest: Bibasilar scarring/atelectasis.  No acute abnormality. Hepatobiliary: Normal parenchymal attenuation and hepatic contour. Unremarkable gallbladder and biliary tree. Pancreas: Unremarkable. Spleen: Unremarkable. Adrenals/Urinary Tract: Stable adrenal glands. No urinary calculi or hydronephrosis. Unremarkable bladder. Stomach/Bowel: Normal caliber large and small bowel. Colonic diverticulosis without diverticulitis. Stomach is within normal limits. Small hiatal hernia. The appendix is not definitively visualized. No secondary signs of appendicitis. Lymphatic: No lymphadenopathy. Reproductive: Unremarkable. Other: No free intraperitoneal fluid or air. Musculoskeletal: Thoracolumbar spondylosis. Intraosseous ganglion cyst as evaluated on MRI 05/25/2021 in the posterior left acetabulum. No acute osseous abnormality. IMPRESSION: 1. 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 No aneurysm or dissection. 2. 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 3. Colonic diverticulosis without diverticulitis. 4. Small hiatal hernia. Aortic Atherosclerosis (ICD10-I70.0). Electronically Signed   By: Minerva Fester M.D.   On: 07/20/2022 17:18       Assessment/Plan 1. Penetrating Abdominal Aortic Ulcer- Possible etiology of current presentation and symptomology  Recommend endovascular intervention this admission  HTN- Control SBP 120-140s  Pain Control 2. Known moderate L2-5/S1 Degenerative disc disease  3. Will obtain ECHO/Cardiology consult prior to intervention    Bertram Denver, MD  07/21/2022 9:05 AM    This note was created with Dragon medical transcription system.  Any error is purely unintentional

## 2022-07-21 NOTE — Hospital Course (Addendum)
" °  Brett Riley is a 87 y.o. Caucasian male with medical history significant for coronary artery disease, hypertension, dyslipidemia and urolithiasis, who presented to the ER with acute onset of bilateral low back pain that started 07/20/2022 in the morning after he got out of bed.  He was barely able to stand per his wife.  He had nausea around  noon. Came to ED.  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 No aneurysm or dissection. 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  Patient had aortic ulcer repair on 5/22.  Postoperatively, patient was receiving physical therapy.  Patient becomes septic with a high fever, tachycardia and altered mental status on 5/30.  Antibiotic started with cefepime  and vancomycin .  Blood culture came back with MSSA on 5/31.  Antibiotics switched to cefazolin . MRI of the lumbar spine shows significant osteomyelitis with abscess.  Neurosurgery consult obtained, not recommended surgery. TEE was performed on 6/3, no evidence of endocarditis.  6/5: Ultram  added, Ortho reevaluation for right knee, neurosurgery reevaluation for discitis/osteo.  Tens unit 6/6: TOC working on placement. Likely DC tomorrow "

## 2022-07-22 ENCOUNTER — Inpatient Hospital Stay (HOSPITAL_COMMUNITY)
Admit: 2022-07-22 | Discharge: 2022-07-22 | Disposition: A | Discharge status: Home / Self Care | Payer: Medicare HMO | Attending: Vascular Surgery | Admitting: Vascular Surgery

## 2022-07-22 DIAGNOSIS — I2510 Atherosclerotic heart disease of native coronary artery without angina pectoris: Secondary | ICD-10-CM

## 2022-07-22 DIAGNOSIS — I714 Abdominal aortic aneurysm, without rupture, unspecified: Secondary | ICD-10-CM | POA: Diagnosis not present

## 2022-07-22 DIAGNOSIS — M5136 Other intervertebral disc degeneration, lumbar region: Secondary | ICD-10-CM

## 2022-07-22 DIAGNOSIS — I719 Aortic aneurysm of unspecified site, without rupture: Secondary | ICD-10-CM | POA: Diagnosis not present

## 2022-07-22 DIAGNOSIS — E785 Hyperlipidemia, unspecified: Secondary | ICD-10-CM | POA: Diagnosis not present

## 2022-07-22 DIAGNOSIS — Z01810 Encounter for preprocedural cardiovascular examination: Secondary | ICD-10-CM | POA: Diagnosis not present

## 2022-07-22 DIAGNOSIS — I10 Essential (primary) hypertension: Secondary | ICD-10-CM | POA: Diagnosis not present

## 2022-07-22 LAB — ECHOCARDIOGRAM COMPLETE
AR max vel: 2.56 cm2
AV Area VTI: 3.15 cm2
AV Area mean vel: 2.64 cm2
AV Mean grad: 5 mmHg
AV Peak grad: 8.1 mmHg
Ao pk vel: 1.42 m/s
Area-P 1/2: 4.01 cm2
Height: 72 in
MV VTI: 2.63 cm2
S' Lateral: 3.5 cm
Weight: 3104 [oz_av]

## 2022-07-22 LAB — CBC
HCT: 35.3 % — ABNORMAL LOW (ref 39.0–52.0)
Hemoglobin: 12 g/dL — ABNORMAL LOW (ref 13.0–17.0)
MCH: 32.9 pg (ref 26.0–34.0)
MCHC: 34 g/dL (ref 30.0–36.0)
MCV: 96.7 fL (ref 80.0–100.0)
Platelets: 124 10*3/uL — ABNORMAL LOW (ref 150–400)
RBC: 3.65 MIL/uL — ABNORMAL LOW (ref 4.22–5.81)
RDW: 13.1 % (ref 11.5–15.5)
WBC: 11.7 10*3/uL — ABNORMAL HIGH (ref 4.0–10.5)
nRBC: 0 % (ref 0.0–0.2)

## 2022-07-22 LAB — BASIC METABOLIC PANEL WITH GFR
Anion gap: 8 (ref 5–15)
BUN: 28 mg/dL — ABNORMAL HIGH (ref 8–23)
CO2: 25 mmol/L (ref 22–32)
Calcium: 8.7 mg/dL — ABNORMAL LOW (ref 8.9–10.3)
Chloride: 100 mmol/L (ref 98–111)
Creatinine, Ser: 1.05 mg/dL (ref 0.61–1.24)
GFR, Estimated: >60 mL/min
Glucose, Bld: 127 mg/dL — ABNORMAL HIGH (ref 70–99)
Potassium: 3.8 mmol/L (ref 3.5–5.1)
Sodium: 133 mmol/L — ABNORMAL LOW (ref 135–145)

## 2022-07-22 MED ORDER — METOPROLOL TARTRATE 5 MG/5ML IV SOLN
5.0000 mg | Freq: Once | INTRAVENOUS | Status: AC
  Administered 2022-07-22: 5 mg via INTRAVENOUS

## 2022-07-22 MED ORDER — METOPROLOL TARTRATE 5 MG/5ML IV SOLN
5.0000 mg | INTRAVENOUS | Status: AC | PRN
  Administered 2022-07-22 – 2022-07-23 (×2): 5 mg via INTRAVENOUS
  Filled 2022-07-22: qty 5

## 2022-07-22 NOTE — Progress Notes (Signed)
       CROSS COVER NOTE  NAME: Brett Riley MRN: [id number] DOB : [birth year] ATTENDING PHYSICIAN: Brett Nielsen, DO  #New Onset Atrial Fibrillation with Rapid Ventricular Response, HR 139 # Penetrating atherosclerotic ulcer of the aorta  - 5 mg IV Metoprolol x 2-3 - Cards Dr Duke Salvia   This document was prepared using Dragon voice recognition software and may include unintentional dictation errors.  Bishop Limbo DNP, MBA, FNP-BC Nurse Practitioner [facility] Pager [phone]

## 2022-07-22 NOTE — Progress Notes (Signed)
*  PRELIMINARY RESULTS* Echocardiogram 2D Echocardiogram has been performed.  Brett Riley 07/22/2022, 9:57 AM

## 2022-07-22 NOTE — Progress Notes (Signed)
MRN : [id number]  Brett Riley is a 87 y.o. (1935/11/08) male who presents with chief complaint of check circulation.  History of Present Illness:   I am asked to see the patient by Dr. Lyn Hollingshead.  Patient is a 87 year old gentleman who was admitted to the hospital approximately 2 days ago with increasing back pain.  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  No other causes for his severe pain have been identified.  No history of an abrupt onset of a painful toe associated with blue discoloration.     No family history of AAA.   Patient denies amaurosis fugax or TIA symptoms. There is no history of claudication or rest pain symptoms of the lower extremities.  The patient denies angina or shortness of breath.  CT scan is reviewed by me and shows a penetrating ulcer of the distal aorta that measures 10 mm x 7 mm.   Current Meds  Medication Sig   amLODipine (NORVASC) 5 MG tablet Take 1 tablet (5 mg total) by mouth daily.   clopidogrel (PLAVIX) 75 MG tablet TAKE 1 TABLET BY MOUTH EVERY DAY WITH BREAKFAST   lisinopril (ZESTRIL) 40 MG tablet Take 1 tablet (40 mg total) by mouth daily.   metoprolol succinate (TOPROL-XL) 100 MG 24 hr tablet TAKE 1 TABLET BY MOUTH EVERY DAY WITH A MEAL   Multiple Vitamins-Iron (MULTIVITAMINS WITH IRON) TABS Take 1 tablet by mouth daily.   rosuvastatin (CRESTOR) 10 MG tablet Take 1 tablet (10 mg total) by mouth daily.   vitamin B-12 (CYANOCOBALAMIN) 1000 MCG tablet Take 1,000 mcg by mouth daily.    Past Medical History:  Diagnosis Date   Cancer [facility])    skin cancer basal   Carotid arterial disease (HCC)    a. 02/2016 L CEA; b. 01/2017 U/S: patent LICA, 1-39% RICA.   Coronary artery disease    a. 01/2016 MV: mild apical/basal inferior, apical lateral, mid anterolateral, and mid inferolateral ischemia. EF 57%; b. 02/2016 Cath: LM 40ost, LAD 70p/m,  20d, D1 95 (small), LCX nl, RCA 39m, RPDA 90 (small), EF 55-65%-->med Rx. Rec CABG for recurrent symptoms.   History of echocardiogram    a. 02/2016 Echo: EF 50-55%, no rwma, mild MR.   History of kidney stones    Hypercholesterolemia    Hypertension     Past Surgical History:  Procedure Laterality Date   CARDIAC CATHETERIZATION N/A 01/19/2016   Procedure: Left Heart Cath and Coronary Angiography;  Surgeon: Iran Ouch, MD;  Location: [facility] INVASIVE CV LAB;  Service: Cardiovascular;  Laterality: N/A;   COLONOSCOPY     CYSTOSCOPY/URETEROSCOPY/HOLMIUM LASER/STENT PLACEMENT Right 04/13/2021   Procedure: CYSTOSCOPY/URETEROSCOPY/HOLMIUM LASER/STENT PLACEMENT;  Surgeon: Sondra Come, MD;  Location: [facility] ORS;  Service: Urology;  Laterality: Right;   ENDARTERECTOMY Left 02/15/2016   Procedure: ENDARTERECTOMY CAROTID;  Surgeon: Annice Needy, MD;  Location: [facility] ORS;  Service: Vascular;  Laterality: Left;    Social History Social History   Tobacco Use   Smoking status: Never   Smokeless tobacco: Never  Vaping Use   Vaping  Use: Never used  Substance Use Topics   Alcohol use: No    Alcohol/week: 0.0 standard drinks of alcohol   Drug use: No    Family History Family History  Problem Relation Age of Onset   Stroke Mother    Heart disease Father        MI   Heart attack Father    Breast cancer Sister    Colon cancer Neg Hx    Prostate cancer Neg Hx     No Known Allergies   REVIEW OF SYSTEMS (Negative unless checked)  Constitutional: [] Weight loss  [] Fever  [] Chills Cardiac: [] Chest pain   [] Chest pressure   [] Palpitations   [] Shortness of breath when laying flat   [] Shortness of breath with exertion. Vascular:  [x] Pain in legs with walking   [] Pain in legs at rest  [] History of DVT   [] Phlebitis   [] Swelling in legs   [] Varicose veins   [] Non-healing ulcers Pulmonary:   [] Uses home oxygen   [] Productive cough   [] Hemoptysis   [] Wheeze  [] COPD   [] Asthma Neurologic:   [] Dizziness   [] Seizures   [] History of stroke   [] History of TIA  [] Aphasia   [] Vissual changes   [] Weakness or numbness in arm   [] Weakness or numbness in leg Musculoskeletal:   [] Joint swelling   [] Joint pain   [] Low back pain Hematologic:  [] Easy bruising  [] Easy bleeding   [] Hypercoagulable state   [] Anemic Gastrointestinal:  [] Diarrhea   [] Vomiting  [] Gastroesophageal reflux/heartburn   [] Difficulty swallowing. Genitourinary:  [] Chronic kidney disease   [] Difficult urination  [] Frequent urination   [] Blood in urine Skin:  [] Rashes   [] Ulcers  Psychological:  [] History of anxiety   []  History of major depression.  Physical Examination  Vitals:   07/21/22 1919 07/21/22 1933 07/22/22 0440 07/22/22 1819  BP:  (!) 144/65 (!) 156/67 (!) 149/77  Pulse:  84 84 81  Resp:  20 20 20   Temp: 99.9 F (37.7 C) 99 F (37.2 C) 99 F (37.2 C) 99.5 F (37.5 C)  TempSrc: Oral Oral Oral   SpO2:  94% 97% 94%  Weight:      Height:       Body mass index is 26.31 kg/m. Gen: WD/WN, laying in bed somewhat somnolent he appears to be in moderate to severe distress with movement Head: [place]/AT, No temporalis wasting.  Ear/Nose/Throat: Hearing grossly intact, nares w/o erythema or drainage Eyes: PER, EOMI, sclera nonicteric.  Neck: Supple, no masses.  No bruit or JVD.  Pulmonary:  Good air movement, no audible wheezing, no use of accessory muscles.  Cardiac: RRR, normal S1, S2, no Murmurs. Vascular:  mild trophic changes, no open wounds Vessel Right Left  Radial Palpable Palpable  Gastrointestinal: soft, non-distended. No guarding/no peritoneal signs.  Musculoskeletal: M/S 5/5 throughout.  No visible deformity.  Neurologic: CN 2-12 intact. Pain and light touch intact in extremities.  Symmetrical.  Speech is diminished. Motor exam as listed above. Psychiatric: Judgment intact, Mood & affect appropriate for pt's clinical situation. Dermatologic: No rashes or ulcers noted.  No changes consistent with  cellulitis.   CBC Lab Results  Component Value Date   WBC 11.7 (H) 07/22/2022   HGB 12.0 (L) 07/22/2022   HCT 35.3 (L) 07/22/2022   MCV 96.7 07/22/2022   PLT 124 (L) 07/22/2022    BMET    Component Value Date/Time   NA 133 (L) 07/22/2022 0541   NA 141 03/24/2019 1046   K 3.8 07/22/2022  0541   CL 100 07/22/2022 0541   CO2 25 07/22/2022 0541   GLUCOSE 127 (H) 07/22/2022 0541   BUN 28 (H) 07/22/2022 0541   BUN 18 03/24/2019 1046   CREATININE 1.05 07/22/2022 0541   CALCIUM 8.7 (L) 07/22/2022 0541   GFRNONAA >60 07/22/2022 0541   GFRAA >60 09/19/2019 0935   Estimated Creatinine Clearance: 57.5 mL/min (by C-G formula based on SCr of 1.05 mg/dL).  COAG Lab Results  Component Value Date   INR 1.08 02/13/2016   INR 0.97 01/18/2016    Radiology ECHOCARDIOGRAM COMPLETE  Result Date: 07/22/2022    ECHOCARDIOGRAM REPORT   Patient Name:   Brett Riley Date of Exam: 07/22/2022 Medical Rec #:  [id number]      Height:       72.0 in Accession #:    [id number]     Weight:       194.0 lb Date of Birth:  27-Aug-1935     BSA:          2.103 m Patient Age:    86 years       BP:           156/67 mmHg Patient Gender: M              HR:           84 bpm. Exam Location:  [facility] Procedure: 2D Echo, Cardiac Doppler and Color Doppler Indications:     CAD native vessel I25.10  History:         Patient has prior history of Echocardiogram examinations, most                  recent 02/08/2016. CAD; Risk Factors:Hypertension.  Sonographer:     Cristela Blue Referring Phys:  [id number] MIECHIA A ESCO Diagnosing Phys: Lorine Bears MD IMPRESSIONS  1. Left ventricular ejection fraction, by estimation, is 55 to 60%. The left ventricle has normal function. The left ventricle has no regional wall motion abnormalities. Left ventricular diastolic parameters are consistent with Grade I diastolic dysfunction (impaired relaxation).  2. Right ventricular systolic function is normal. The right ventricular size is normal. There is  normal pulmonary artery systolic pressure.  3. The mitral valve is normal in structure. Mild to moderate mitral valve regurgitation. No evidence of mitral stenosis. Moderate mitral annular calcification.  4. The aortic valve is normal in structure. Aortic valve regurgitation is trivial. Aortic valve sclerosis is present, with no evidence of aortic valve stenosis.  5. The inferior vena cava is normal in size with greater than 50% respiratory variability, suggesting right atrial pressure of 3 mmHg. FINDINGS  Left Ventricle: Left ventricular ejection fraction, by estimation, is 55 to 60%. The left ventricle has normal function. The left ventricle has no regional wall motion abnormalities. The left ventricular internal cavity size was normal in size. There is  borderline left ventricular hypertrophy. Left ventricular diastolic parameters are consistent with Grade I diastolic dysfunction (impaired relaxation). Right Ventricle: The right ventricular size is normal. No increase in right ventricular wall thickness. Right ventricular systolic function is normal. There is normal pulmonary artery systolic pressure. The tricuspid regurgitant velocity is 2.42 m/s, and  with an assumed right atrial pressure of 3 mmHg, the estimated right ventricular systolic pressure is 26.4 mmHg. Left Atrium: Left atrial size was normal in size. Right Atrium: Right atrial size was normal in size. Pericardium: There is no evidence of pericardial effusion. Mitral Valve: The mitral valve is normal in structure.  There is moderate thickening of the mitral valve leaflet(s). There is mild calcification of the mitral valve leaflet(s). Moderate mitral annular calcification. Mild to moderate mitral valve regurgitation. No evidence of mitral valve stenosis. MV peak gradient, 5.2 mmHg. The mean mitral valve gradient is 2.0 mmHg. Tricuspid Valve: The tricuspid valve is normal in structure. Tricuspid valve regurgitation is mild . No evidence of tricuspid  stenosis. Aortic Valve: The aortic valve is normal in structure. Aortic valve regurgitation is trivial. Aortic valve sclerosis is present, with no evidence of aortic valve stenosis. Aortic valve mean gradient measures 5.0 mmHg. Aortic valve peak gradient measures 8.1 mmHg. Aortic valve area, by VTI measures 3.15 cm. Pulmonic Valve: The pulmonic valve was normal in structure. Pulmonic valve regurgitation is not visualized. No evidence of pulmonic stenosis. Aorta: The aortic root is normal in size and structure. Venous: The inferior vena cava is normal in size with greater than 50% respiratory variability, suggesting right atrial pressure of 3 mmHg. IAS/Shunts: No atrial level shunt detected by color flow Doppler.  LEFT VENTRICLE PLAX 2D LVIDd:         4.80 cm   Diastology LVIDs:         3.50 cm   LV e' medial:    8.16 cm/s LV PW:         1.10 cm   LV E/e' medial:  9.8 LV IVS:        1.00 cm   LV e' lateral:   9.14 cm/s LVOT diam:     2.20 cm   LV E/e' lateral: 8.8 LV SV:         79 LV SV Index:   38 LVOT Area:     3.80 cm  RIGHT VENTRICLE RV Basal diam:  4.60 cm RV Mid diam:    3.10 cm RV S prime:     14.90 cm/s TAPSE (M-mode): 2.4 cm LEFT ATRIUM             Index        RIGHT ATRIUM           Index LA diam:        3.40 cm 1.62 cm/m   RA Area:     14.40 cm LA Vol (A2C):   56.1 ml 26.67 ml/m  RA Volume:   28.90 ml  13.74 ml/m LA Vol (A4C):   41.4 ml 19.68 ml/m LA Biplane Vol: 49.6 ml 23.58 ml/m  AORTIC VALVE AV Area (Vmax):    2.56 cm AV Area (Vmean):   2.64 cm AV Area (VTI):     3.15 cm AV Vmax:           142.00 cm/s AV Vmean:          98.700 cm/s AV VTI:            0.252 m AV Peak Grad:      8.1 mmHg AV Mean Grad:      5.0 mmHg LVOT Vmax:         95.80 cm/s LVOT Vmean:        68.500 cm/s LVOT VTI:          0.209 m LVOT/AV VTI ratio: 0.83  AORTA Ao Root diam: 3.50 cm MITRAL VALVE                TRICUSPID VALVE MV Area (PHT): 4.01 cm     TR Peak grad:   23.4 mmHg MV Area VTI:   2.63 cm  TR Vmax:         242.00 cm/s MV Peak grad:  5.2 mmHg MV Mean grad:  2.0 mmHg     SHUNTS MV Vmax:       1.14 m/s     Systemic VTI:  0.21 m MV Vmean:      60.7 cm/s    Systemic Diam: 2.20 cm MV Decel Time: 189 msec MV E velocity: 80.20 cm/s MV A velocity: 115.00 cm/s MV E/A ratio:  0.70 Lorine Bears MD Electronically signed by Lorine Bears MD Signature Date/Time: 07/22/2022/11:10:37 AM    Final    DG Chest Portable 1 View  Result Date: 07/20/2022 CLINICAL DATA:  Bilateral lower back pain EXAM: PORTABLE CHEST 1 VIEW COMPARISON:  Chest radiograph dated 09/19/2019 FINDINGS: Normal lung volumes. No focal consolidations. No pleural effusion or pneumothorax. The heart size and mediastinal contours are within normal limits. Dextrocurvature of the upper thoracic spine may be related to patient positioning. IMPRESSION: No active disease. Electronically Signed   By: Agustin Cree M.D.   On: 07/20/2022 21:29   CT Angio Abd/Pel W and/or Wo Contrast  Result Date: 07/20/2022 CLINICAL DATA:  Aneurysm, renal or visceral. AAA suspected. Bilateral low back pain. EXAM: CTA ABDOMEN AND PELVIS WITHOUT AND WITH CONTRAST TECHNIQUE: Multidetector CT imaging of the abdomen and pelvis was performed using the standard protocol during bolus administration of intravenous contrast. Multiplanar reconstructed images and MIPs were obtained and reviewed to evaluate the vascular anatomy. RADIATION DOSE REDUCTION: This exam was performed according to the departmental dose-optimization program which includes automated exposure control, adjustment of the mA and/or kV according to patient size and/or use of iterative reconstruction technique. CONTRAST:  80mL OMNIPAQUE IOHEXOL 350 MG/ML SOLN COMPARISON:  CT 04/09/2021 FINDINGS: VASCULAR Aorta: Moderate mixed density atherosclerotic plaque in the abdominal aorta. Penetrating atherosclerotic ulcer of the infrarenal abdominal aorta immediately superior to the bifurcation (series 7/image 118) with a 10 mm neck and a 7 mm  depth. The aorta measures 27 mm in maximum dimension at this level. No aneurysm or dissection. Celiac: No aneurysm or dissection. Predominantly calcified plaque at the origin causes advanced narrowing. SMA: No aneurysm or dissection. Predominantly calcified plaque at the origin causes advanced narrowing. Additional calcified plaque in the proximal SMA causes moderate narrowing. Renals: No aneurysm or dissection. Calcified plaque at the origins of both renal arteries causes advanced narrowing bilaterally. Accessory left renal artery. IMA: Patent. Inflow: Scattered predominantly calcified atherosclerotic plaque causes multifocal areas of mild narrowing. No aneurysm or dissection. Proximal Outflow: No aneurysm, dissection, or occlusion. Veins: No obvious venous abnormality within the limitations of this arterial phase study. Review of the MIP images confirms the above findings. NON-VASCULAR Lower chest: Bibasilar scarring/atelectasis.  No acute abnormality. Hepatobiliary: Normal parenchymal attenuation and hepatic contour. Unremarkable gallbladder and biliary tree. Pancreas: Unremarkable. Spleen: Unremarkable. Adrenals/Urinary Tract: Stable adrenal glands. No urinary calculi or hydronephrosis. Unremarkable bladder. Stomach/Bowel: Normal caliber large and small bowel. Colonic diverticulosis without diverticulitis. Stomach is within normal limits. Small hiatal hernia. The appendix is not definitively visualized. No secondary signs of appendicitis. Lymphatic: No lymphadenopathy. Reproductive: Unremarkable. Other: No free intraperitoneal fluid or air. Musculoskeletal: Thoracolumbar spondylosis. Intraosseous ganglion cyst as evaluated on MRI 05/25/2021 in the posterior left acetabulum. No acute osseous abnormality. IMPRESSION: 1. 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 No aneurysm or dissection. 2. Advanced narrowing at the origins of the celiac axis, SMA, and  bilateral renal arteries. 3.  Colonic diverticulosis without diverticulitis. 4. Small hiatal hernia. Aortic Atherosclerosis (ICD10-I70.0). Electronically Signed   By: Minerva Fester M.D.   On: 07/20/2022 17:18     Assessment/Plan 1. Penetrating Abdominal Aortic Ulcer: This is certainly a possible etiology of current presentation and severe pain but this is not conclusively so.  In discussion with his daughter he seems to have associated pain in the legs and weakness.  I wonder if some of his symptoms can be related to his LS spine disease.  Nevertheless, rupture secondary to a penetrating aortic ulcer would likely be lethal.  I have personally reviewed his CT and he is a stent graft candidate.  I recommend endovascular intervention this admission.  Risks and benefits of been reviewed his daughter was sitting at the bedside.  All questions been answered we will move forward with stent graft placement on Wednesday.             HTN- Control SBP 120-140s             Pain Control  2. Known moderate L2-5/S1 Degenerative disc disease:  I would not exclude this as a possible significant etiology for his presentation.  3.  Hypertension: Continue antihypertensive medications as already ordered, these medications have been reviewed and there are no changes at this time.   4.  Hyperlipidemia: Continue statin as ordered and reviewed, no changes at this time  Levora Dredge, MD  07/22/2022 7:14 PM

## 2022-07-22 NOTE — Progress Notes (Signed)
Rounding Note    Patient Name: Brett Riley Date of Encounter: 07/22/2022  [facility] Cardiologist: Lorine Bears, MD   Subjective   Patient seen on a.m. rounds.  Wife remains at bedside.  Continues to deny chest pain or shortness of breath.  Echocardiogram pending to be completed this morning.  Inpatient Medications    Scheduled Meds:  amLODipine  5 mg Oral Daily   aspirin EC  81 mg Oral QHS   clopidogrel  75 mg Oral Daily   cyanocobalamin  1,000 mcg Oral Daily   enoxaparin (LOVENOX) injection  40 mg Subcutaneous Q24H   lisinopril  40 mg Oral Daily   metoprolol succinate  100 mg Oral Daily   multivitamin with minerals  1 tablet Oral Daily   rosuvastatin  40 mg Oral Daily   Continuous Infusions:  lactated ringers 75 mL/hr at 07/22/22 0217   PRN Meds: acetaminophen **OR** acetaminophen, fentaNYL (SUBLIMAZE) injection, hydrALAZINE, labetalol, magnesium hydroxide, ondansetron (ZOFRAN) IV, traZODone   Vital Signs    Vitals:   07/21/22 1622 07/21/22 1919 07/21/22 1933 07/22/22 0440  BP: (!) 150/70  (!) 144/65 (!) 156/67  Pulse: 80  84 84  Resp: 16  20 20   Temp: 100.2 F (37.9 C) 99.9 F (37.7 C) 99 F (37.2 C) 99 F (37.2 C)  TempSrc: Oral Oral Oral Oral  SpO2: 97%  94% 97%  Weight:      Height:        Intake/Output Summary (Last 24 hours) at 07/22/2022 0735 Last data filed at 07/22/2022 0546 Gross per 24 hour  Intake 2256.66 ml  Output 800 ml  Net 1456.66 ml      07/20/2022    9:07 PM 07/20/2022    2:46 PM 06/06/2022   11:35 AM  Last 3 Weights  Weight (lbs) 194 lb 197 lb 197 lb  Weight (kg) 87.998 kg 89.359 kg 89.359 kg      Telemetry    Sinus rhythm with rates in the 70s to 80s with occasional PACs and unifocal PVCs- Personally Reviewed  ECG    No new tracings- Personally Reviewed  Physical Exam   GEN: No acute distress.   Neck: No JVD Cardiac: RRR, no murmurs, rubs, or gallops.  Respiratory: Clear to auscultation bilaterally.   Respirations remain unlabored at rest on room air GI: Soft, nontender, non-distended  MS: No edema; No deformity. Neuro:  Nonfocal  Psych: Normal affect   Labs    High Sensitivity Troponin:  No results for input(s): "TROPONINIHS" in the last 720 hours.   Chemistry Recent Labs  Lab 07/20/22 1552 07/21/22 0448 07/22/22 0541  NA 136 137 133*  K 3.7 3.9 3.8  CL 104 105 100  CO2 24 23 25   GLUCOSE 121* 128* 127*  BUN 25* 24* 28*  CREATININE 0.99 1.04 1.05  CALCIUM 9.2 9.0 8.7*  PROT 7.7  --   --   ALBUMIN 4.6  --   --   AST 38  --   --   ALT 25  --   --   ALKPHOS 72  --   --   BILITOT 0.9  --   --   GFRNONAA >60 >60 >60  ANIONGAP 8 9 8     Lipids No results for input(s): "CHOL", "TRIG", "HDL", "LABVLDL", "LDLCALC", "CHOLHDL" in the last 168 hours.  Hematology Recent Labs  Lab 07/20/22 1552 07/21/22 0448 07/22/22 0541  WBC 10.3 17.9* 11.7*  RBC 4.06* 4.05* 3.65*  HGB 13.3  13.2 12.0*  HCT 39.7 39.4 35.3*  MCV 97.8 97.3 96.7  MCH 32.8 32.6 32.9  MCHC 33.5 33.5 34.0  RDW 13.1 13.2 13.1  PLT 158 145* 124*   Thyroid No results for input(s): "TSH", "FREET4" in the last 168 hours.  BNP Recent Labs  Lab 07/21/22 0447  BNP 329.1*    DDimer  Recent Labs  Lab 07/20/22 2233  DDIMER 2.03*     Radiology    DG Chest Portable 1 View  Result Date: 07/20/2022 CLINICAL DATA:  Bilateral lower back pain EXAM: PORTABLE CHEST 1 VIEW COMPARISON:  Chest radiograph dated 09/19/2019 FINDINGS: Normal lung volumes. No focal consolidations. No pleural effusion or pneumothorax. The heart size and mediastinal contours are within normal limits. Dextrocurvature of the upper thoracic spine may be related to patient positioning. IMPRESSION: No active disease. Electronically Signed   By: Agustin Cree M.D.   On: 07/20/2022 21:29   CT Angio Abd/Pel W and/or Wo Contrast  Result Date: 07/20/2022 CLINICAL DATA:  Aneurysm, renal or visceral. AAA suspected. Bilateral low back pain. EXAM: CTA ABDOMEN  AND PELVIS WITHOUT AND WITH CONTRAST TECHNIQUE: Multidetector CT imaging of the abdomen and pelvis was performed using the standard protocol during bolus administration of intravenous contrast. Multiplanar reconstructed images and MIPs were obtained and reviewed to evaluate the vascular anatomy. RADIATION DOSE REDUCTION: This exam was performed according to the departmental dose-optimization program which includes automated exposure control, adjustment of the mA and/or kV according to patient size and/or use of iterative reconstruction technique. CONTRAST:  80mL OMNIPAQUE IOHEXOL 350 MG/ML SOLN COMPARISON:  CT 04/09/2021 FINDINGS: VASCULAR Aorta: Moderate mixed density atherosclerotic plaque in the abdominal aorta. 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 The aorta measures 27 mm in maximum dimension at this level. No aneurysm or dissection. Celiac: No aneurysm or dissection. Predominantly calcified plaque at the origin causes advanced narrowing. SMA: No aneurysm or dissection. Predominantly calcified plaque at the origin causes advanced narrowing. Additional calcified plaque in the proximal SMA causes moderate narrowing. Renals: No aneurysm or dissection. Calcified plaque at the origins of both renal arteries causes advanced narrowing bilaterally. Accessory left renal artery. IMA: Patent. Inflow: Scattered predominantly calcified atherosclerotic plaque causes multifocal areas of mild narrowing. No aneurysm or dissection. Proximal Outflow: No aneurysm, dissection, or occlusion. Veins: No obvious venous abnormality within the limitations of this arterial phase study. Review of the MIP images confirms the above findings. NON-VASCULAR Lower chest: Bibasilar scarring/atelectasis.  No acute abnormality. Hepatobiliary: Normal parenchymal attenuation and hepatic contour. Unremarkable gallbladder and  biliary tree. Pancreas: Unremarkable. Spleen: Unremarkable. Adrenals/Urinary Tract: Stable adrenal glands. No urinary calculi or hydronephrosis. Unremarkable bladder. Stomach/Bowel: Normal caliber large and small bowel. Colonic diverticulosis without diverticulitis. Stomach is within normal limits. Small hiatal hernia. The appendix is not definitively visualized. No secondary signs of appendicitis. Lymphatic: No lymphadenopathy. Reproductive: Unremarkable. Other: No free intraperitoneal fluid or air. Musculoskeletal: Thoracolumbar spondylosis. Intraosseous ganglion cyst as evaluated on MRI 05/25/2021 in the posterior left acetabulum. No acute osseous abnormality. IMPRESSION: 1. 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 No aneurysm or dissection. 2. 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 3. Colonic diverticulosis without diverticulitis. 4. Small hiatal hernia. Aortic Atherosclerosis (ICD10-I70.0). Electronically Signed   By: Minerva Fester M.D.   On: 07/20/2022 17:18  Cardiac Studies   2D echo 02/08/2016: - Left ventricle: The cavity size was normal. Wall thickness was    normal. Systolic function was normal. The estimated ejection    fraction was in the range of 50% to 55%. Wall motion was normal;    there were no regional wall motion abnormalities. Left    ventricular diastolic function parameters were normal for the    patient&'s age.  - Mitral valve: There was mild regurgitation. __________   LHC 01/19/2016: The left ventricular systolic function is normal. LV end diastolic pressure is mildly elevated. The left ventricular ejection fraction is 55-65% by visual estimate. Mid RCA lesion, 20 %stenosed. RPDA lesion, 90 %stenosed. Ost LM to LM lesion, 40 %stenosed. Dist LAD lesion, 20 %stenosed. 1st Diag lesion, 95 %stenosed. Prox LAD to Mid LAD lesion, 70 %stenosed.   1.  Significant 2 vessel coronary artery disease. Severe disease involving the mid to distal right PDA and first diagonal. These are not optimal for PCI and risks probably outweigh the benefit. There is moderate left main stenosis and borderline significant disease involving the proximal to mid LAD which is heavily calcified. 2. Normal LV systolic function and mildly elevated left ventricular end-diastolic pressure.   Recommendations: In the absence of clear anginal symptoms, I recommend aggressive medical therapy. If the patient becomes symptomatic, CABG can be considered given that he has diffuse heavily calcified disease. In terms of preoperative cardiovascular risk, he is considered at moderate risk for carotid endarterectomy. Consider switching to a more potent statin and recommend aggressive treatment of risk factors.  Patient Profile     87 y.o. male with past medical history of CAD, carotid artery disease status post left carotid endarterectomy, PAD, hypertension, hyperlipidemia has been seen and evaluated for cardiac risk stratification for vascular repair of penetrating aortic ulcer.  Assessment & Plan    Preoperative cardiac restratification for endovascular repair of penetrating aortic ulcer -Patient was recommended for surgical intervention endovascular repair by vascular surgery -Echo continues to be pending to be completed this morning -If echo demonstrates stable LV systolic function and no new wall motion abnormality patient will be able to proceed with endovascular repair without further cardiac intervention -Ongoing management per vascular surgery  CAD involving the native coronary arteries without angina -Last heart catheterization showed up to 70% proximal to mid LAD stenosis in 2017 -Continues to deny chest discomfort -Echocardiogram pending -DAPT has been continued upon admission with aspirin and clopidogrel -Continue with telemetry monitoring  Hypertension -Blood  pressure 156/67 -Continued on amlodipine, lisinopril, and metoprolol -Vital signs per unit protocol  Hyperlipidemia -Overall lipids have been well-controlled -Recommendation with his extensive and progressive atherosclerosis LDL goal of less than 55 -Continued on rosuvastatin 40 mg daily -As outpatient consider addition of Zetia 10 mg daily or PCSK9 inhibitor  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

## 2022-07-22 NOTE — TOC CM/SW Note (Signed)
  Transition of Care [facility]) Screening Note   Patient Details  Name: Brett Riley Date of Birth: 13-Oct-1935   Transition of Care [facility]) CM/SW Contact:    Margarito Liner, LCSW Phone Number: 07/22/2022, 11:07 AM    Transition of Care Department [facility]) has reviewed patient and no TOC needs have been identified at this time. We will continue to monitor patient advancement through interdisciplinary progression rounds. If new patient transition needs arise, please place a TOC consult.

## 2022-07-22 NOTE — Progress Notes (Signed)
PROGRESS NOTE    Brett Riley   [name] DOB: Jul 13, 1935  DOA: 07/20/2022 Date of Service: 07/22/22 PCP: Dale Hawkins, MD     Brief Narrative / Hospital Course:  DAAIEL Riley is a 87 y.o. Caucasian male with medical history significant for coronary artery disease, hypertension, dyslipidemia and urolithiasis, who presented to the ER with acute onset of bilateral low back pain that started 07/20/2022 in the morning after he got out of bed.  He was barely able to stand per his wife.  He had nausea around  noon. Came to ED.  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 No aneurysm or dissection. 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 Dr Evie Lacks w/ vascular surgery team was consulted by EDP - Since no other explanation for back pain, aortic ulceration is suspected to be the culprit. Dr. Evie Lacks recs hospitalization, pt admitted to hospitalist service.  05/19: Vasc surg planning endovascular intervention if/when cleared per cardiology, recs for cardiology eval and Echo.  05/20: echocardiogram pending - per cardiology if stable LV systolic function and no new wall motion abnormality patient will be able to proceed with endovascular repair without further cardiac intervention   Consultants:  Vascular surgery Cardiology   Procedures: none      ASSESSMENT & PLAN:   Principal Problem:   Penetrating atherosclerotic ulcer of aorta (HCC) Active Problems:   Uncontrolled hypertension   Coronary artery disease   Hypoxemia   Dyslipidemia   Penetrating atherosclerotic ulcer of aorta (HCC) Pain management aspirin and Plavix. high-dose statin. Strict blood pressure control. Vascular surgery consult w/ Dr. Evie Lacks  Echocardiogram - pending - per cardiology if stable LV systolic function and no new wall motion abnormality patient will be able to proceed  with endovascular repair without further cardiac intervention   Uncontrolled hypertension antihypertensives. as needed IV labetalol and hydralazine for adequate BP control.  Dyslipidemia increase statin therapy dose.  Coronary artery disease continue aspirin Plavix as well as statin therapy, ACE inhibitor with lisinopril and beta-blocker with Toprol-XL  Hypoxemia - pt is not taking good breaths d/t pain  (+)D-Dimer likely d/t inflammation has not had any dyspnea or cough or wheezing, no chest pain, no pleuritic chest pain Defer PE w/u, Wells 0  Avoid excessive use contrast, pt also does not want to move for any tests d/t back pain     DVT prophylaxis: lovenox  Pertinent IV fluids/nutrition: CLD Central lines / invasive devices: none  Code Status: FULL CODE  ACP documentation reviewed: 07/21/22 none on file VYNCA   Current Admission Status: inpatient   TOC needs / Dispo plan: TBD Barriers to discharge / significant pending items: pending vascular repair aortic ulcer             Subjective / Brief ROS:  Patient reports significant back pain, stable from yesterday, denies chest pain, no SOB, no pleuritic pain, no headache, no dizziness  Family Communication: wife is at bedside on rounds     Objective Findings:  Vitals:   07/21/22 1622 07/21/22 1919 07/21/22 1933 07/22/22 0440  BP: (!) 150/70  (!) 144/65 (!) 156/67  Pulse: 80  84 84  Resp: 16  20 20   Temp: 100.2 F (37.9 C) 99.9 F (37.7 C) 99 F (37.2 C) 99 F (37.2 C)  TempSrc: Oral Oral Oral Oral  SpO2: 97%  94% 97%  Weight:  Height:        Intake/Output Summary (Last 24 hours) at 07/22/2022 1204 Last data filed at 07/22/2022 1025 Gross per 24 hour  Intake 2222.91 ml  Output 600 ml  Net 1622.91 ml   Filed Weights   07/20/22 1446 07/20/22 2107  Weight: 89.4 kg 88 kg    Examination:  Physical Exam Constitutional:      General: He is not in acute distress. Cardiovascular:     Rate and  Rhythm: Normal rate and regular rhythm.     Heart sounds: Normal heart sounds.  Pulmonary:     Effort: Pulmonary effort is normal.     Breath sounds: Normal breath sounds.  Abdominal:     General: Abdomen is flat.     Palpations: Abdomen is soft.  Musculoskeletal:     Right lower leg: No edema.     Left lower leg: No edema.  Neurological:     General: No focal deficit present.     Mental Status: He is alert.  Psychiatric:        Mood and Affect: Mood normal.        Behavior: Behavior normal.          Scheduled Medications:   amLODipine  5 mg Oral Daily   aspirin EC  81 mg Oral QHS   clopidogrel  75 mg Oral Daily   cyanocobalamin  1,000 mcg Oral Daily   enoxaparin (LOVENOX) injection  40 mg Subcutaneous Q24H   lisinopril  40 mg Oral Daily   metoprolol succinate  100 mg Oral Daily   multivitamin with minerals  1 tablet Oral Daily   rosuvastatin  40 mg Oral Daily    Continuous Infusions:  lactated ringers 75 mL/hr at 07/22/22 0217    PRN Medications:  acetaminophen **OR** acetaminophen, fentaNYL (SUBLIMAZE) injection, hydrALAZINE, labetalol, magnesium hydroxide, ondansetron (ZOFRAN) IV, traZODone  Antimicrobials from admission:  Anti-infectives (From admission, onward)    None           Data Reviewed:  I have personally reviewed the following...  CBC: Recent Labs  Lab 07/20/22 1552 07/21/22 0448 07/22/22 0541  WBC 10.3 17.9* 11.7*  NEUTROABS 8.6*  --   --   HGB 13.3 13.2 12.0*  HCT 39.7 39.4 35.3*  MCV 97.8 97.3 96.7  PLT 158 145* 124*   Basic Metabolic Panel: Recent Labs  Lab 07/20/22 1552 07/21/22 0448 07/22/22 0541  NA 136 137 133*  K 3.7 3.9 3.8  CL 104 105 100  CO2 24 23 25   GLUCOSE 121* 128* 127*  BUN 25* 24* 28*  CREATININE 0.99 1.04 1.05  CALCIUM 9.2 9.0 8.7*   GFR: Estimated Creatinine Clearance: 57.5 mL/min (by C-G formula based on SCr of 1.05 mg/dL). Liver Function Tests: Recent Labs  Lab 07/20/22 1552  AST 38  ALT  25  ALKPHOS 72  BILITOT 0.9  PROT 7.7  ALBUMIN 4.6   Recent Labs  Lab 07/20/22 1552  LIPASE 28   No results for input(s): "AMMONIA" in the last 168 hours. Coagulation Profile: No results for input(s): "INR", "PROTIME" in the last 168 hours. Cardiac Enzymes: No results for input(s): "CKTOTAL", "CKMB", "CKMBINDEX", "TROPONINI" in the last 168 hours. BNP (last 3 results) No results for input(s): "PROBNP" in the last 8760 hours. HbA1C: No results for input(s): "HGBA1C" in the last 72 hours. CBG: No results for input(s): "GLUCAP" in the last 168 hours. Lipid Profile: No results for input(s): "CHOL", "HDL", "LDLCALC", "TRIG", "CHOLHDL", "LDLDIRECT" in the  last 72 hours. Thyroid Function Tests: No results for input(s): "TSH", "T4TOTAL", "FREET4", "T3FREE", "THYROIDAB" in the last 72 hours. Anemia Panel: No results for input(s): "VITAMINB12", "FOLATE", "FERRITIN", "TIBC", "IRON", "RETICCTPCT" in the last 72 hours. Most Recent Urinalysis On File:     Component Value Date/Time   COLORURINE YELLOW (A) 04/10/2021 2343   APPEARANCEUR CLEAR (A) 04/10/2021 2343   LABSPEC 1.025 04/10/2021 2343   PHURINE 5.0 04/10/2021 2343   GLUCOSEU NEGATIVE 04/10/2021 2343   HGBUR MODERATE (A) 04/10/2021 2343   BILIRUBINUR NEGATIVE 04/10/2021 2343   KETONESUR NEGATIVE 04/10/2021 2343   PROTEINUR NEGATIVE 04/10/2021 2343   NITRITE NEGATIVE 04/10/2021 2343   LEUKOCYTESUR NEGATIVE 04/10/2021 2343   Sepsis Labs: @LABRCNTIP (procalcitonin:4,lacticidven:4) Microbiology: No results found for this or any previous visit (from the past 240 hour(s)).    Radiology Studies last 3 days: ECHOCARDIOGRAM COMPLETE  Result Date: 07/22/2022    ECHOCARDIOGRAM REPORT   Patient Name:   Brett Riley Date of Exam: 07/22/2022 Medical Rec #:  [id number]      Height:       72.0 in Accession #:    [id number]     Weight:       194.0 lb Date of Birth:  [birth year]     BSA:          2.103 m Patient Age:    86 years       BP:            156/67 mmHg Patient Gender: M              HR:           84 bpm. Exam Location:  [facility] Procedure: 2D Echo, Cardiac Doppler and Color Doppler Indications:     CAD native vessel I25.10  History:         Patient has prior history of Echocardiogram examinations, most                  recent 02/08/2016. CAD; Risk Factors:Hypertension.  Sonographer:     Cristela Blue Referring Phys:  [id number] MIECHIA A ESCO Diagnosing Phys: Lorine Bears MD IMPRESSIONS  1. Left ventricular ejection fraction, by estimation, is 55 to 60%. The left ventricle has normal function. The left ventricle has no regional wall motion abnormalities. Left ventricular diastolic parameters are consistent with Grade I diastolic dysfunction (impaired relaxation).  2. Right ventricular systolic function is normal. The right ventricular size is normal. There is normal pulmonary artery systolic pressure.  3. The mitral valve is normal in structure. Mild to moderate mitral valve regurgitation. No evidence of mitral stenosis. Moderate mitral annular calcification.  4. The aortic valve is normal in structure. Aortic valve regurgitation is trivial. Aortic valve sclerosis is present, with no evidence of aortic valve stenosis.  5. The inferior vena cava is normal in size with greater than 50% respiratory variability, suggesting right atrial pressure of 3 mmHg. FINDINGS  Left Ventricle: Left ventricular ejection fraction, by estimation, is 55 to 60%. The left ventricle has normal function. The left ventricle has no regional wall motion abnormalities. The left ventricular internal cavity size was normal in size. There is  borderline left ventricular hypertrophy. Left ventricular diastolic parameters are consistent with Grade I diastolic dysfunction (impaired relaxation). Right Ventricle: The right ventricular size is normal. No increase in right ventricular wall thickness. Right ventricular systolic function is normal. There is normal pulmonary artery systolic  pressure. The tricuspid regurgitant velocity is 2.42 m/s, and  with an  assumed right atrial pressure of 3 mmHg, the estimated right ventricular systolic pressure is 26.4 mmHg. Left Atrium: Left atrial size was normal in size. Right Atrium: Right atrial size was normal in size. Pericardium: There is no evidence of pericardial effusion. Mitral Valve: The mitral valve is normal in structure. There is moderate thickening of the mitral valve leaflet(s). There is mild calcification of the mitral valve leaflet(s). Moderate mitral annular calcification. Mild to moderate mitral valve regurgitation. No evidence of mitral valve stenosis. MV peak gradient, 5.2 mmHg. The mean mitral valve gradient is 2.0 mmHg. Tricuspid Valve: The tricuspid valve is normal in structure. Tricuspid valve regurgitation is mild . No evidence of tricuspid stenosis. Aortic Valve: The aortic valve is normal in structure. Aortic valve regurgitation is trivial. Aortic valve sclerosis is present, with no evidence of aortic valve stenosis. Aortic valve mean gradient measures 5.0 mmHg. Aortic valve peak gradient measures 8.1 mmHg. Aortic valve area, by VTI measures 3.15 cm. Pulmonic Valve: The pulmonic valve was normal in structure. Pulmonic valve regurgitation is not visualized. No evidence of pulmonic stenosis. Aorta: The aortic root is normal in size and structure. Venous: The inferior vena cava is normal in size with greater than 50% respiratory variability, suggesting right atrial pressure of 3 mmHg. IAS/Shunts: No atrial level shunt detected by color flow Doppler.  LEFT VENTRICLE PLAX 2D LVIDd:         4.80 cm   Diastology LVIDs:         3.50 cm   LV e' medial:    8.16 cm/s LV PW:         1.10 cm   LV E/e' medial:  9.8 LV IVS:        1.00 cm   LV e' lateral:   9.14 cm/s LVOT diam:     2.20 cm   LV E/e' lateral: 8.8 LV SV:         79 LV SV Index:   38 LVOT Area:     3.80 cm  RIGHT VENTRICLE RV Basal diam:  4.60 cm RV Mid diam:    3.10 cm RV S prime:      14.90 cm/s TAPSE (M-mode): 2.4 cm LEFT ATRIUM             Index        RIGHT ATRIUM           Index LA diam:        3.40 cm 1.62 cm/m   RA Area:     14.40 cm LA Vol (A2C):   56.1 ml 26.67 ml/m  RA Volume:   28.90 ml  13.74 ml/m LA Vol (A4C):   41.4 ml 19.68 ml/m LA Biplane Vol: 49.6 ml 23.58 ml/m  AORTIC VALVE AV Area (Vmax):    2.56 cm AV Area (Vmean):   2.64 cm AV Area (VTI):     3.15 cm AV Vmax:           142.00 cm/s AV Vmean:          98.700 cm/s AV VTI:            0.252 m AV Peak Grad:      8.1 mmHg AV Mean Grad:      5.0 mmHg LVOT Vmax:         95.80 cm/s LVOT Vmean:        68.500 cm/s LVOT VTI:          0.209 m LVOT/AV VTI ratio: 0.83  AORTA Ao Root diam: 3.50 cm MITRAL VALVE                TRICUSPID VALVE MV Area (PHT): 4.01 cm     TR Peak grad:   23.4 mmHg MV Area VTI:   2.63 cm     TR Vmax:        242.00 cm/s MV Peak grad:  5.2 mmHg MV Mean grad:  2.0 mmHg     SHUNTS MV Vmax:       1.14 m/s     Systemic VTI:  0.21 m MV Vmean:      60.7 cm/s    Systemic Diam: 2.20 cm MV Decel Time: 189 msec MV E velocity: 80.20 cm/s MV A velocity: 115.00 cm/s MV E/A ratio:  0.70 Lorine Bears MD Electronically signed by Lorine Bears MD Signature Date/Time: 07/22/2022/11:10:37 AM    Final    DG Chest Portable 1 View  Result Date: 07/20/2022 CLINICAL DATA:  Bilateral lower back pain EXAM: PORTABLE CHEST 1 VIEW COMPARISON:  Chest radiograph dated 09/19/2019 FINDINGS: Normal lung volumes. No focal consolidations. No pleural effusion or pneumothorax. The heart size and mediastinal contours are within normal limits. Dextrocurvature of the upper thoracic spine may be related to patient positioning. IMPRESSION: No active disease. Electronically Signed   By: Agustin Cree M.D.   On: 07/20/2022 21:29   CT Angio Abd/Pel W and/or Wo Contrast  Result Date: 07/20/2022 CLINICAL DATA:  Aneurysm, renal or visceral. AAA suspected. Bilateral low back pain. EXAM: CTA ABDOMEN AND PELVIS WITHOUT AND WITH CONTRAST TECHNIQUE:  Multidetector CT imaging of the abdomen and pelvis was performed using the standard protocol during bolus administration of intravenous contrast. Multiplanar reconstructed images and MIPs were obtained and reviewed to evaluate the vascular anatomy. RADIATION DOSE REDUCTION: This exam was performed according to the departmental dose-optimization program which includes automated exposure control, adjustment of the mA and/or kV according to patient size and/or use of iterative reconstruction technique. CONTRAST:  80mL OMNIPAQUE IOHEXOL 350 MG/ML SOLN COMPARISON:  CT 04/09/2021 FINDINGS: VASCULAR Aorta: Moderate mixed density atherosclerotic plaque in the abdominal aorta. 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 The aorta measures 27 mm in maximum dimension at this level. No aneurysm or dissection. Celiac: No aneurysm or dissection. Predominantly calcified plaque at the origin causes advanced narrowing. SMA: No aneurysm or dissection. Predominantly calcified plaque at the origin causes advanced narrowing. Additional calcified plaque in the proximal SMA causes moderate narrowing. Renals: No aneurysm or dissection. Calcified plaque at the origins of both renal arteries causes advanced narrowing bilaterally. Accessory left renal artery. IMA: Patent. Inflow: Scattered predominantly calcified atherosclerotic plaque causes multifocal areas of mild narrowing. No aneurysm or dissection. Proximal Outflow: No aneurysm, dissection, or occlusion. Veins: No obvious venous abnormality within the limitations of this arterial phase study. Review of the MIP images confirms the above findings. NON-VASCULAR Lower chest: Bibasilar scarring/atelectasis.  No acute abnormality. Hepatobiliary: Normal parenchymal attenuation and hepatic contour. Unremarkable gallbladder and biliary tree. Pancreas: Unremarkable. Spleen:  Unremarkable. Adrenals/Urinary Tract: Stable adrenal glands. No urinary calculi or hydronephrosis. Unremarkable bladder. Stomach/Bowel: Normal caliber large and small bowel. Colonic diverticulosis without diverticulitis. Stomach is within normal limits. Small hiatal hernia. The appendix is not definitively visualized. No secondary signs of appendicitis. Lymphatic: No lymphadenopathy. Reproductive: Unremarkable. Other: No free intraperitoneal fluid or air. Musculoskeletal: Thoracolumbar spondylosis. Intraosseous ganglion cyst as evaluated on  MRI 05/25/2021 in the posterior left acetabulum. No acute osseous abnormality. IMPRESSION: 1. 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 No aneurysm or dissection. 2. 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 3. Colonic diverticulosis without diverticulitis. 4. Small hiatal hernia. Aortic Atherosclerosis (ICD10-I70.0). Electronically Signed   By: Minerva Fester M.D.   On: 07/20/2022 17:18             LOS: 2 days       Sunnie Nielsen, DO [facility] 07/22/2022, 12:04 PM    Dictation software may have been used to generate the above note. Typos may occur and escape review in typed/dictated notes. Please contact Dr Lyn Hollingshead directly for clarity if needed.  Staff may message me via secure chat in Epic  but this may not receive an immediate response,  please page me for urgent matters!  If 7PM-7AM, please contact night coverage [email or web address]

## 2022-07-23 DIAGNOSIS — I4891 Unspecified atrial fibrillation: Secondary | ICD-10-CM | POA: Insufficient documentation

## 2022-07-23 DIAGNOSIS — I719 Aortic aneurysm of unspecified site, without rupture: Secondary | ICD-10-CM | POA: Diagnosis not present

## 2022-07-23 LAB — BASIC METABOLIC PANEL WITH GFR
Anion gap: 9 (ref 5–15)
BUN: 32 mg/dL — ABNORMAL HIGH (ref 8–23)
CO2: 22 mmol/L (ref 22–32)
Calcium: 8.4 mg/dL — ABNORMAL LOW (ref 8.9–10.3)
Chloride: 99 mmol/L (ref 98–111)
Creatinine, Ser: 0.93 mg/dL (ref 0.61–1.24)
GFR, Estimated: >60 mL/min
Glucose, Bld: 128 mg/dL — ABNORMAL HIGH (ref 70–99)
Potassium: 3.5 mmol/L (ref 3.5–5.1)
Sodium: 130 mmol/L — ABNORMAL LOW (ref 135–145)

## 2022-07-23 LAB — APTT: aPTT: 33 s (ref 24–36)

## 2022-07-23 LAB — PROTIME-INR
INR: 1.3 — ABNORMAL HIGH (ref 0.8–1.2)
Prothrombin Time: 15.9 s — ABNORMAL HIGH (ref 11.4–15.2)

## 2022-07-23 LAB — CBC
HCT: 35.4 % — ABNORMAL LOW (ref 39.0–52.0)
Hemoglobin: 12.3 g/dL — ABNORMAL LOW (ref 13.0–17.0)
MCH: 32.5 pg (ref 26.0–34.0)
MCHC: 34.7 g/dL (ref 30.0–36.0)
MCV: 93.4 fL (ref 80.0–100.0)
Platelets: 121 10*3/uL — ABNORMAL LOW (ref 150–400)
RBC: 3.79 MIL/uL — ABNORMAL LOW (ref 4.22–5.81)
RDW: 12.7 % (ref 11.5–15.5)
WBC: 7.7 10*3/uL (ref 4.0–10.5)
nRBC: 0 % (ref 0.0–0.2)

## 2022-07-23 LAB — TYPE AND SCREEN
ABO/RH(D): A POS
Antibody Screen: NEGATIVE

## 2022-07-23 LAB — HEPARIN LEVEL (UNFRACTIONATED): Heparin Unfractionated: 0.18 [IU]/mL — ABNORMAL LOW (ref 0.30–0.70)

## 2022-07-23 LAB — TSH: TSH: 2.922 u[IU]/mL (ref 0.350–4.500)

## 2022-07-23 LAB — MAGNESIUM: Magnesium: 2 mg/dL (ref 1.7–2.4)

## 2022-07-23 MED ORDER — SODIUM CHLORIDE 0.9 % IV SOLN: INTRAVENOUS | Status: AC

## 2022-07-23 MED ORDER — METOPROLOL SUCCINATE ER 50 MG PO TB24: 50.0000 mg | ORAL_TABLET | Freq: Every day | ORAL | Status: DC

## 2022-07-23 MED ORDER — METOPROLOL SUCCINATE ER 25 MG PO TB24
25.0000 mg | ORAL_TABLET | Freq: Every day | ORAL | Status: DC
  Administered 2022-07-23: 25 mg via ORAL
  Filled 2022-07-23: qty 1

## 2022-07-23 MED ORDER — MIDAZOLAM HCL 2 MG/ML PO SYRP
8.0000 mg | ORAL_SOLUTION | Freq: Once | ORAL | Status: DC | PRN
  Filled 2022-07-23: qty 5

## 2022-07-23 MED ORDER — DILTIAZEM HCL 25 MG/5ML IV SOLN
10.0000 mg | Freq: Once | INTRAVENOUS | Status: AC
  Administered 2022-07-23: 10 mg via INTRAVENOUS
  Filled 2022-07-23: qty 5

## 2022-07-23 MED ORDER — ASPIRIN 81 MG PO TBEC
81.0000 mg | DELAYED_RELEASE_TABLET | Freq: Every day | ORAL | Status: DC
  Administered 2022-07-23 – 2022-08-09 (×17): 81 mg via ORAL
  Filled 2022-07-23 (×17): qty 1

## 2022-07-23 MED ORDER — HEPARIN (PORCINE) 25000 UT/250ML-% IV SOLN
1550.0000 [IU]/h | INTRAVENOUS | Status: DC
  Administered 2022-07-23: 1200 [IU]/h via INTRAVENOUS
  Administered 2022-07-24: 1550 [IU]/h via INTRAVENOUS
  Filled 2022-07-23 (×2): qty 250

## 2022-07-23 MED ORDER — METHYLPREDNISOLONE SODIUM SUCC 125 MG IJ SOLR: 125.0000 mg | Freq: Once | INTRAMUSCULAR | Status: DC | PRN

## 2022-07-23 MED ORDER — METOPROLOL SUCCINATE ER 50 MG PO TB24
50.0000 mg | ORAL_TABLET | Freq: Two times a day (BID) | ORAL | Status: DC
  Administered 2022-07-23 – 2022-07-26 (×6): 50 mg via ORAL
  Filled 2022-07-23 (×5): qty 1

## 2022-07-23 MED ORDER — HEPARIN BOLUS VIA INFUSION
2700.0000 [IU] | Freq: Once | INTRAVENOUS | Status: AC
  Administered 2022-07-23: 2700 [IU] via INTRAVENOUS
  Filled 2022-07-23: qty 2700

## 2022-07-23 MED ORDER — CEFAZOLIN SODIUM-DEXTROSE 2-4 GM/100ML-% IV SOLN
2.0000 g | INTRAVENOUS | Status: AC
  Administered 2022-07-24: 2 g via INTRAVENOUS
  Filled 2022-07-23: qty 100

## 2022-07-23 MED ORDER — HEPARIN BOLUS VIA INFUSION
4000.0000 [IU] | Freq: Once | INTRAVENOUS | Status: AC
  Administered 2022-07-23: 4000 [IU] via INTRAVENOUS
  Filled 2022-07-23: qty 4000

## 2022-07-23 MED ORDER — CHLORHEXIDINE GLUCONATE CLOTH 2 % EX PADS
6.0000 | MEDICATED_PAD | Freq: Once | CUTANEOUS | Status: AC
  Administered 2022-07-23: 6 via TOPICAL

## 2022-07-23 MED ORDER — METOPROLOL SUCCINATE ER 25 MG PO TB24
25.0000 mg | ORAL_TABLET | Freq: Once | ORAL | Status: AC
  Administered 2022-07-23: 25 mg via ORAL
  Filled 2022-07-23: qty 1

## 2022-07-23 MED ORDER — POTASSIUM CHLORIDE CRYS ER 20 MEQ PO TBCR
20.0000 meq | EXTENDED_RELEASE_TABLET | Freq: Every day | ORAL | Status: DC
  Administered 2022-07-23 – 2022-08-01 (×10): 20 meq via ORAL
  Filled 2022-07-23 (×9): qty 1

## 2022-07-23 MED ORDER — FAMOTIDINE 20 MG PO TABS: 40.0000 mg | ORAL_TABLET | Freq: Once | ORAL | Status: DC | PRN

## 2022-07-23 MED ORDER — METOPROLOL TARTRATE 5 MG/5ML IV SOLN
5.0000 mg | Freq: Four times a day (QID) | INTRAVENOUS | Status: DC | PRN
  Administered 2022-08-06: 5 mg via INTRAVENOUS
  Filled 2022-07-23 (×2): qty 5

## 2022-07-23 MED ORDER — DIPHENHYDRAMINE HCL 50 MG/ML IJ SOLN: 50.0000 mg | Freq: Once | INTRAMUSCULAR | Status: DC | PRN

## 2022-07-23 NOTE — Consult Note (Addendum)
ANTICOAGULATION CONSULT NOTE - Initial Consult  Pharmacy Consult for Heparin infusion Indication: atrial fibrillation  No Known Allergies  Patient Measurements: Height: 6' (182.9 cm) Weight: 89.9 kg (198 lb 1.6 oz) IBW/kg (Calculated) : 77.6 Heparin Dosing Weight: 89.9 kg  Vital Signs: Temp: 98.7 F (37.1 C) (05/21 0923) Temp Source: Oral (05/21 0923) BP: 117/70 (05/21 0923) Pulse Rate: 94 (05/21 0923)  Labs: Recent Labs    07/21/22 0448 07/22/22 0541 07/23/22 0335  HGB 13.2 12.0* 12.3*  HCT 39.4 35.3* 35.4*  PLT 145* 124* 121*  CREATININE 1.04 1.05 0.93    Estimated Creatinine Clearance: 64.9 mL/min (by C-G formula based on SCr of 0.93 mg/dL).  Medical History: Past Medical History:  Diagnosis Date   Cancer (HCC)    skin cancer basal   Carotid arterial disease (HCC)    a. 02/2016 L CEA; b. 01/2017 U/S: patent LICA, 1-39% RICA.   Coronary artery disease    a. 01/2016 MV: mild apical/basal inferior, apical lateral, mid anterolateral, and mid inferolateral ischemia. EF 57%; b. 02/2016 Cath: LM 40ost, LAD 70p/m, 20d, D1 95 (small), LCX nl, RCA 84m, RPDA 90 (small), EF 55-65%-->med Rx. Rec CABG for recurrent symptoms.   History of echocardiogram    a. 02/2016 Echo: EF 50-55%, no rwma, mild MR.   History of kidney stones    Hypercholesterolemia    Hypertension     Assessment: Patient with Hx of CAD, HTN, HDL, and urolithiasis. Scheduled for vascular surgery on 07/24/2022. Cardiology consulted for new onset A.Fib.  Pharmacy to manage heparin infusion until all procedures completed.   Per Dr Asencion Islam on 5/21: "thanks for checking no we will stop the heparin on call"  Goal of Therapy:  Heparin level 0.3-0.7 units/ml Monitor platelets by anticoagulation protocol: Yes   Plan:  Give 4000 units bolus x 1 Start heparin infusion at 1200 units/hr Check anti-Xa level in 8 hours and daily while on heparin Continue to monitor H&H and platelets  Brett Riley  PharmD, BCPS 07/23/2022 11:50 AM

## 2022-07-23 NOTE — Consult Note (Signed)
ANTICOAGULATION CONSULT NOTE - Initial Consult  Pharmacy Consult for Heparin infusion Indication: atrial fibrillation  No Known Allergies  Patient Measurements: Height: 6' (182.9 cm) Weight: 89.9 kg (198 lb 1.6 oz) IBW/kg (Calculated) : 77.6 Heparin Dosing Weight: 89.9 kg  Vital Signs: Temp: 99 F (37.2 C) (05/21 1940) Temp Source: Oral (05/21 1940) BP: 135/87 (05/21 1940) Pulse Rate: 115 (05/21 1940)  Labs: Recent Labs    07/21/22 0448 07/22/22 0541 07/23/22 0335 07/23/22 1210 07/23/22 2222  HGB 13.2 12.0* 12.3*  --   --   HCT 39.4 35.3* 35.4*  --   --   PLT 145* 124* 121*  --   --   APTT  --   --   --  33  --   LABPROT  --   --   --  15.9*  --   INR  --   --   --  1.3*  --   HEPARINUNFRC  --   --   --   --  0.18*  CREATININE 1.04 1.05 0.93  --   --      Estimated Creatinine Clearance: 64.9 mL/min (by C-G formula based on SCr of 0.93 mg/dL).  Medical History: Past Medical History:  Diagnosis Date   Cancer (HCC)    skin cancer basal   Carotid arterial disease (HCC)    a. 02/2016 L CEA; b. 01/2017 U/S: patent LICA, 1-39% RICA.   Coronary artery disease    a. 01/2016 MV: mild apical/basal inferior, apical lateral, mid anterolateral, and mid inferolateral ischemia. EF 57%; b. 02/2016 Cath: LM 40ost, LAD 70p/m, 20d, D1 95 (small), LCX nl, RCA 49m, RPDA 90 (small), EF 55-65%-->med Rx. Rec CABG for recurrent symptoms.   History of echocardiogram    a. 02/2016 Echo: EF 50-55%, no rwma, mild MR.   History of kidney stones    Hypercholesterolemia    Hypertension     Assessment: Patient with Hx of CAD, HTN, HDL, and urolithiasis. Scheduled for vascular surgery on 07/24/2022. Cardiology consulted for new onset A.Fib.  Pharmacy to manage heparin infusion until all procedures completed.   Per Dr Asencion Islam on 5/21: "thanks for checking no we will stop the heparin on call"  Goal of Therapy:  Heparin level 0.3-0.7 units/ml Monitor platelets by anticoagulation protocol:  Yes   Plan:  5/21:  HL @ 2222 = 0.18, SUBtherapeutic - Will order heparin 2700 units IV X 1 bolus and increase drip rate to 1550 units/hr.  - Will recheck HL 8 hrs after rate change   Kathleen Tamm D 07/23/2022 11:14 PM

## 2022-07-23 NOTE — Care Management Important Message (Signed)
Important Message  Patient Details  Name: Brett Riley MRN: [id number] Date of Birth: 20-Feb-1936   Medicare Important Message Given:  Yes     [name] 07/23/2022, 11:17 AM

## 2022-07-23 NOTE — Consult Note (Signed)
[facility] Customer service manager [facility]) Accountable Care Organization (ACO) [facility] Liaison Note  07/23/2022  Brett Riley [birth year] [id number]  Location: [facility] RN Hospital Liaison screened the patient remotely at [facility].  Insurance: Brett Riley is a 87 y.o. male who is a Primary Care Patient of Dale Amsterdam, MD ([facility]). The patient was screened for readmission hospitalization with noted low risk score for unplanned readmission risk with 1 IP in 6 months.  The patient was assessed for potential [facility]) Care Management service needs for post hospital transition for care coordination. Review of patient's electronic medical record reveals patient was admitted with PVD.  No anticipated discharge needs presented at this time.  Plan: [facility] Series Liaison will continue to follow progress and disposition to asess for post hospital community care coordination/management needs.  Referral request for community care coordination: anticipate [facility] Transitions of Care Team follow up.   [facility] Care Management/Population Health does not replace or interfere with any arrangements made by the Inpatient Transition of Care team.   For questions contact:   Elliot Cousin, RN, BSN [facility] Liaison [place]   [facility]  Population Health Office Hours MTWF  8:00 am-6:00 pm Off on Thursday [phone] mobile [phone] [Office toll free line][facility] Office Hours are M-F 8:30 - 5 pm 24 hour nurse advise line [phone] Concierge  [email or web address]

## 2022-07-23 NOTE — Progress Notes (Signed)
Patient admitted to the floor and placed on telemetry. A skin assessment was completed by myself and Lennice Sites. No skin issues were noted at this time.

## 2022-07-23 NOTE — Progress Notes (Addendum)
PROGRESS NOTE    Brett Riley   [name] DOB: 1935-09-28  DOA: 07/20/2022 Date of Service: 07/23/22 PCP: Dale Kilmarnock, MD     Brief Narrative / Hospital Course:  Brett Riley is a 87 y.o. Caucasian male with medical history significant for coronary artery disease, hypertension, dyslipidemia and urolithiasis, who presented to the ER with acute onset of bilateral low back pain that started 07/20/2022 in the morning after he got out of bed.  He was barely able to stand per his wife.  He had nausea around  noon. Came to ED.  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 No aneurysm or dissection. 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 Dr Evie Lacks w/ vascular surgery team was consulted by EDP - Since no other explanation for back pain, aortic ulceration is suspected to be the culprit. Dr. Evie Lacks recs hospitalization, pt admitted to hospitalist service.  05/19: Vasc surg planning endovascular intervention if/when cleared per cardiology, recs for cardiology eval and Echo.  05/20: echocardiogram pending - stable LV systolic function and no new wall motion abnormality --> patient will be able to proceed with endovascular repair without further cardiac intervention  05/21: converted into atrial fibrillation with RVR and required diltiazem and metoprolol.  Rate was controlled after this and did not require diltiazem drip. Starting heparin gtt. Await vascular procedure planned for tomorrow   Consultants:  Vascular surgery Cardiology   Procedures: none      ASSESSMENT & PLAN:   Principal Problem:   Penetrating atherosclerotic ulcer of aorta (HCC) Active Problems:   Uncontrolled hypertension   Coronary artery disease   Hypoxemia   Preop cardiovascular exam   Dyslipidemia   PVD (peripheral vascular disease) (HCC)   Atrial fibrillation  (HCC)   Penetrating atherosclerotic ulcer of aorta (HCC) Pain management aspirin and Plavix. high-dose statin. Strict blood pressure control. Vascular surgery following and planning for repair  Atrial fibrillation new onset 05/20 22:58 Achieved rate control w/ IV meds not requiring drip Already had echo done  heparin infusion for CHA2DS2-VASc score of at least 4 Toprol-XL 50 mg twice daily (PTA Toprol-XL 100 mg daily, discontinued due to drop in blood pressure) Potassium 3.5 recommend keeping potassium greater than 4 less than 5 TSH 2.922 Checking magnesium level Continue on telemetry monitoring Will need to be transition to oral anticoagulant prior to discharge  SIRS d/t pain/aortic ulcer No infectious source or symptoms WBC to WNL, HR was improving, tachcyardia now d/t afib  Monitor   Hyponatremia Minimal po intake, has been on LR fluids NS fluids for now  Monitor BMP  Essential hypertension Metoprolol for rate control and BP Holding usual home meds d/t soft BP as needed IV labetalol and hydralazine for adequate BP control.  Dyslipidemia increase statin therapy dose.  Coronary artery disease continue aspirin Plavix as well as statin therapy, ACE inhibitor with lisinopril and beta-blocker with Toprol-XL  Hypoxemia early admission - pt is not taking good breaths d/t pain  (+)D-Dimer likely d/t inflammation has not had any dyspnea or cough or wheezing, no chest pain, no pleuritic chest pain Defer PE w/u, Wells 0  Avoid excessive use contrast, pt also does not want to move for any tests d/t back pain     DVT prophylaxis: lovenox  Pertinent IV fluids/nutrition: CLD Central lines / invasive devices: none  Code Status: FULL CODE  ACP  documentation reviewed: 07/21/22 none on file [facility]   Current Admission Status: inpatient   TOC needs / Dispo plan: TBD Barriers to discharge / significant pending items: pending vascular repair aortic  ulcer             Subjective / Brief ROS:  Patient reports significant back pain, stable from yesterday, denies chest pain, no SOB, no pleuritic pain, no headache, no dizziness, no palpitations   Family Communication: wife and daughter at bedside on rounds     Objective Findings:  Vitals:   07/23/22 0134 07/23/22 0404 07/23/22 0923 07/23/22 1235  BP: 131/79 120/66 117/70 130/80  Pulse: (!) 121 (!) 103 94 (!) 102  Resp: 18 20 20  (!) 22  Temp: 98.8 F (37.1 C) 98.8 F (37.1 C) 98.7 F (37.1 C) 99.1 F (37.3 C)  TempSrc: Oral Oral Oral Axillary  SpO2: 95% 94% 94% 93%  Weight:  89.9 kg    Height:        Intake/Output Summary (Last 24 hours) at 07/23/2022 1531 Last data filed at 07/23/2022 1414 Gross per 24 hour  Intake 900 ml  Output --  Net 900 ml   Filed Weights   07/20/22 1446 07/20/22 2107 07/23/22 0404  Weight: 89.4 kg 88 kg 89.9 kg    Examination:  Physical Exam Constitutional:      General: He is not in acute distress. Cardiovascular:     Rate and Rhythm: Normal rate. Rhythm irregular.     Heart sounds: Normal heart sounds.  Pulmonary:     Effort: Pulmonary effort is normal.     Breath sounds: Normal breath sounds.  Abdominal:     General: Abdomen is flat.     Palpations: Abdomen is soft.  Musculoskeletal:     Right lower leg: No edema.     Left lower leg: No edema.  Neurological:     General: No focal deficit present.     Mental Status: He is alert.  Psychiatric:        Mood and Affect: Mood normal.        Behavior: Behavior normal.          Scheduled Medications:   aspirin EC  81 mg Oral Daily   clopidogrel  75 mg Oral Daily   cyanocobalamin  1,000 mcg Oral Daily   metoprolol succinate  50 mg Oral BID   multivitamin with minerals  1 tablet Oral Daily   potassium chloride  20 mEq Oral Daily   rosuvastatin  40 mg Oral Daily    Continuous Infusions:  sodium chloride 100 mL/hr at 07/23/22 1037   heparin 1,200 Units/hr  (07/23/22 1412)    PRN Medications:  acetaminophen **OR** acetaminophen, fentaNYL (SUBLIMAZE) injection, hydrALAZINE, magnesium hydroxide, metoprolol tartrate, ondansetron (ZOFRAN) IV, traZODone  Antimicrobials from admission:  Anti-infectives (From admission, onward)    None           Data Reviewed:  I have personally reviewed the following...  CBC: Recent Labs  Lab 07/20/22 1552 07/21/22 0448 07/22/22 0541 07/23/22 0335  WBC 10.3 17.9* 11.7* 7.7  NEUTROABS 8.6*  --   --   --   HGB 13.3 13.2 12.0* 12.3*  HCT 39.7 39.4 35.3* 35.4*  MCV 97.8 97.3 96.7 93.4  PLT 158 145* 124* 121*   Basic Metabolic Panel: Recent Labs  Lab 07/20/22 1552 07/21/22 0448 07/22/22 0541 07/23/22 0335  NA 136 137 133* 130*  K 3.7 3.9 3.8 3.5  CL 104 105 100 99  CO2 24 23 25 22   GLUCOSE 121* 128* 127* 128*  BUN 25* 24* 28* 32*  CREATININE 0.99 1.04 1.05 0.93  CALCIUM 9.2 9.0 8.7* 8.4*   GFR: Estimated Creatinine Clearance: 64.9 mL/min (by C-G formula based on SCr of 0.93 mg/dL). Liver Function Tests: Recent Labs  Lab 07/20/22 1552  AST 38  ALT 25  ALKPHOS 72  BILITOT 0.9  PROT 7.7  ALBUMIN 4.6   Recent Labs  Lab 07/20/22 1552  LIPASE 28   No results for input(s): "AMMONIA" in the last 168 hours. Coagulation Profile: Recent Labs  Lab 07/23/22 1210  INR 1.3*   Cardiac Enzymes: No results for input(s): "CKTOTAL", "CKMB", "CKMBINDEX", "TROPONINI" in the last 168 hours. BNP (last 3 results) No results for input(s): "PROBNP" in the last 8760 hours. HbA1C: No results for input(s): "HGBA1C" in the last 72 hours. CBG: No results for input(s): "GLUCAP" in the last 168 hours. Lipid Profile: No results for input(s): "CHOL", "HDL", "LDLCALC", "TRIG", "CHOLHDL", "LDLDIRECT" in the last 72 hours. Thyroid Function Tests: Recent Labs    07/23/22 0335  TSH 2.922   Anemia Panel: No results for input(s): "VITAMINB12", "FOLATE", "FERRITIN", "TIBC", "IRON", "RETICCTPCT" in  the last 72 hours. Most Recent Urinalysis On File:     Component Value Date/Time   COLORURINE YELLOW (A) 04/10/2021 2343   APPEARANCEUR CLEAR (A) 04/10/2021 2343   LABSPEC 1.025 04/10/2021 2343   PHURINE 5.0 04/10/2021 2343   GLUCOSEU NEGATIVE 04/10/2021 2343   HGBUR MODERATE (A) 04/10/2021 2343   BILIRUBINUR NEGATIVE 04/10/2021 2343   KETONESUR NEGATIVE 04/10/2021 2343   PROTEINUR NEGATIVE 04/10/2021 2343   NITRITE NEGATIVE 04/10/2021 2343   LEUKOCYTESUR NEGATIVE 04/10/2021 2343   Sepsis Labs: @LABRCNTIP (procalcitonin:4,lacticidven:4) Microbiology: No results found for this or any previous visit (from the past 240 hour(s)).    Radiology Studies last 3 days: ECHOCARDIOGRAM COMPLETE  Result Date: 07/22/2022    ECHOCARDIOGRAM REPORT   Patient Name:   SEBASTION SAGE Date of Exam: 07/22/2022 Medical Rec #:  [id number]      Height:       72.0 in Accession #:    [id number]     Weight:       194.0 lb Date of Birth:  11-Mar-1935     BSA:          2.103 m Patient Age:    86 years       BP:           156/67 mmHg Patient Gender: M              HR:           84 bpm. Exam Location:  [facility] Procedure: 2D Echo, Cardiac Doppler and Color Doppler Indications:     CAD native vessel I25.10  History:         Patient has prior history of Echocardiogram examinations, most                  recent 02/08/2016. CAD; Risk Factors:Hypertension.  Sonographer:     Cristela Blue Referring Phys:  [id number] MIECHIA A ESCO Diagnosing Phys: Lorine Bears MD IMPRESSIONS  1. Left ventricular ejection fraction, by estimation, is 55 to 60%. The left ventricle has normal function. The left ventricle has no regional wall motion abnormalities. Left ventricular diastolic parameters are consistent with Grade I diastolic dysfunction (impaired relaxation).  2. Right ventricular systolic function is normal. The right ventricular size is normal. There is normal pulmonary artery systolic pressure.  3. The mitral valve is normal in structure. Mild  to moderate mitral valve regurgitation. No evidence of mitral stenosis. Moderate mitral annular calcification.  4. The aortic valve is normal in structure. Aortic valve regurgitation is trivial. Aortic valve sclerosis is present, with no evidence of aortic valve stenosis.  5. The inferior vena cava is normal in size with greater than 50% respiratory variability, suggesting right atrial pressure of 3 mmHg. FINDINGS  Left Ventricle: Left ventricular ejection fraction, by estimation, is 55 to 60%. The left ventricle has normal function. The left ventricle has no regional wall motion abnormalities. The left ventricular internal cavity size was normal in size. There is  borderline left ventricular hypertrophy. Left ventricular diastolic parameters are consistent with Grade I diastolic dysfunction (impaired relaxation). Right Ventricle: The right ventricular size is normal. No increase in right ventricular wall thickness. Right ventricular systolic function is normal. There is normal pulmonary artery systolic pressure. The tricuspid regurgitant velocity is 2.42 m/s, and  with an assumed right atrial pressure of 3 mmHg, the estimated right ventricular systolic pressure is 26.4 mmHg. Left Atrium: Left atrial size was normal in size. Right Atrium: Right atrial size was normal in size. Pericardium: There is no evidence of pericardial effusion. Mitral Valve: The mitral valve is normal in structure. There is moderate thickening of the mitral valve leaflet(s). There is mild calcification of the mitral valve leaflet(s). Moderate mitral annular calcification. Mild to moderate mitral valve regurgitation. No evidence of mitral valve stenosis. MV peak gradient, 5.2 mmHg. The mean mitral valve gradient is 2.0 mmHg. Tricuspid Valve: The tricuspid valve is normal in structure. Tricuspid valve regurgitation is mild . No evidence of tricuspid stenosis. Aortic Valve: The aortic valve is normal in structure. Aortic valve regurgitation is  trivial. Aortic valve sclerosis is present, with no evidence of aortic valve stenosis. Aortic valve mean gradient measures 5.0 mmHg. Aortic valve peak gradient measures 8.1 mmHg. Aortic valve area, by VTI measures 3.15 cm. Pulmonic Valve: The pulmonic valve was normal in structure. Pulmonic valve regurgitation is not visualized. No evidence of pulmonic stenosis. Aorta: The aortic root is normal in size and structure. Venous: The inferior vena cava is normal in size with greater than 50% respiratory variability, suggesting right atrial pressure of 3 mmHg. IAS/Shunts: No atrial level shunt detected by color flow Doppler.  LEFT VENTRICLE PLAX 2D LVIDd:         4.80 cm   Diastology LVIDs:         3.50 cm   LV e' medial:    8.16 cm/s LV PW:         1.10 cm   LV E/e' medial:  9.8 LV IVS:        1.00 cm   LV e' lateral:   9.14 cm/s LVOT diam:     2.20 cm   LV E/e' lateral: 8.8 LV SV:         79 LV SV Index:   38 LVOT Area:     3.80 cm  RIGHT VENTRICLE RV Basal diam:  4.60 cm RV Mid diam:    3.10 cm RV S prime:     14.90 cm/s TAPSE (M-mode): 2.4 cm LEFT ATRIUM             Index        RIGHT ATRIUM           Index LA diam:        3.40 cm 1.62 cm/m   RA Area:  14.40 cm LA Vol (A2C):   56.1 ml 26.67 ml/m  RA Volume:   28.90 ml  13.74 ml/m LA Vol (A4C):   41.4 ml 19.68 ml/m LA Biplane Vol: 49.6 ml 23.58 ml/m  AORTIC VALVE AV Area (Vmax):    2.56 cm AV Area (Vmean):   2.64 cm AV Area (VTI):     3.15 cm AV Vmax:           142.00 cm/s AV Vmean:          98.700 cm/s AV VTI:            0.252 m AV Peak Grad:      8.1 mmHg AV Mean Grad:      5.0 mmHg LVOT Vmax:         95.80 cm/s LVOT Vmean:        68.500 cm/s LVOT VTI:          0.209 m LVOT/AV VTI ratio: 0.83  AORTA Ao Root diam: 3.50 cm MITRAL VALVE                TRICUSPID VALVE MV Area (PHT): 4.01 cm     TR Peak grad:   23.4 mmHg MV Area VTI:   2.63 cm     TR Vmax:        242.00 cm/s MV Peak grad:  5.2 mmHg MV Mean grad:  2.0 mmHg     SHUNTS MV Vmax:       1.14 m/s      Systemic VTI:  0.21 m MV Vmean:      60.7 cm/s    Systemic Diam: 2.20 cm MV Decel Time: 189 msec MV E velocity: 80.20 cm/s MV A velocity: 115.00 cm/s MV E/A ratio:  0.70 Lorine Bears MD Electronically signed by Lorine Bears MD Signature Date/Time: 07/22/2022/11:10:37 AM    Final    DG Chest Portable 1 View  Result Date: 07/20/2022 CLINICAL DATA:  Bilateral lower back pain EXAM: PORTABLE CHEST 1 VIEW COMPARISON:  Chest radiograph dated 09/19/2019 FINDINGS: Normal lung volumes. No focal consolidations. No pleural effusion or pneumothorax. The heart size and mediastinal contours are within normal limits. Dextrocurvature of the upper thoracic spine may be related to patient positioning. IMPRESSION: No active disease. Electronically Signed   By: Agustin Cree M.D.   On: 07/20/2022 21:29   CT Angio Abd/Pel W and/or Wo Contrast  Result Date: 07/20/2022 CLINICAL DATA:  Aneurysm, renal or visceral. AAA suspected. Bilateral low back pain. EXAM: CTA ABDOMEN AND PELVIS WITHOUT AND WITH CONTRAST TECHNIQUE: Multidetector CT imaging of the abdomen and pelvis was performed using the standard protocol during bolus administration of intravenous contrast. Multiplanar reconstructed images and MIPs were obtained and reviewed to evaluate the vascular anatomy. RADIATION DOSE REDUCTION: This exam was performed according to the departmental dose-optimization program which includes automated exposure control, adjustment of the mA and/or kV according to patient size and/or use of iterative reconstruction technique. CONTRAST:  80mL OMNIPAQUE IOHEXOL 350 MG/ML SOLN COMPARISON:  CT 04/09/2021 FINDINGS: VASCULAR Aorta: Moderate mixed density atherosclerotic plaque in the abdominal aorta. 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 The aorta measures 27 mm in maximum dimension at this level. No aneurysm or dissection.  Celiac: No aneurysm or dissection. Predominantly calcified plaque at the origin causes advanced narrowing. SMA: No aneurysm or dissection. Predominantly calcified plaque at the origin causes advanced narrowing.  Additional calcified plaque in the proximal SMA causes moderate narrowing. Renals: No aneurysm or dissection. Calcified plaque at the origins of both renal arteries causes advanced narrowing bilaterally. Accessory left renal artery. IMA: Patent. Inflow: Scattered predominantly calcified atherosclerotic plaque causes multifocal areas of mild narrowing. No aneurysm or dissection. Proximal Outflow: No aneurysm, dissection, or occlusion. Veins: No obvious venous abnormality within the limitations of this arterial phase study. Review of the MIP images confirms the above findings. NON-VASCULAR Lower chest: Bibasilar scarring/atelectasis.  No acute abnormality. Hepatobiliary: Normal parenchymal attenuation and hepatic contour. Unremarkable gallbladder and biliary tree. Pancreas: Unremarkable. Spleen: Unremarkable. Adrenals/Urinary Tract: Stable adrenal glands. No urinary calculi or hydronephrosis. Unremarkable bladder. Stomach/Bowel: Normal caliber large and small bowel. Colonic diverticulosis without diverticulitis. Stomach is within normal limits. Small hiatal hernia. The appendix is not definitively visualized. No secondary signs of appendicitis. Lymphatic: No lymphadenopathy. Reproductive: Unremarkable. Other: No free intraperitoneal fluid or air. Musculoskeletal: Thoracolumbar spondylosis. Intraosseous ganglion cyst as evaluated on MRI 05/25/2021 in the posterior left acetabulum. No acute osseous abnormality. IMPRESSION: 1. 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 No aneurysm or dissection. 2. 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 3. Colonic diverticulosis without diverticulitis. 4. Small hiatal  hernia. Aortic Atherosclerosis (ICD10-I70.0). Electronically Signed   By: Minerva Fester M.D.   On: 07/20/2022 17:18             LOS: 3 days       Sunnie Nielsen, DO [facility] 07/23/2022, 3:31 PM    Dictation software may have been used to generate the above note. Typos may occur and escape review in typed/dictated notes. Please contact Dr Lyn Hollingshead directly for clarity if needed.  Staff may message me via secure chat in Epic  but this may not receive an immediate response,  please page me for urgent matters!  If 7PM-7AM, please contact night coverage [email or web address]

## 2022-07-23 NOTE — Progress Notes (Signed)
MRN : [id number]  Brett Riley is a 87 y.o. (1935/12/10) male who presents with chief complaint of check circulation.  History of Present Illness:  Patient is much more alert today he notes that his pain is better but still rates it as severe.  He does able to move his legs somewhat today.  He denies nausea vomiting.  Current Meds  Medication Sig   amLODipine (NORVASC) 5 MG tablet Take 1 tablet (5 mg total) by mouth daily.   clopidogrel (PLAVIX) 75 MG tablet TAKE 1 TABLET BY MOUTH EVERY DAY WITH BREAKFAST   lisinopril (ZESTRIL) 40 MG tablet Take 1 tablet (40 mg total) by mouth daily.   metoprolol succinate (TOPROL-XL) 100 MG 24 hr tablet TAKE 1 TABLET BY MOUTH EVERY DAY WITH A MEAL   Multiple Vitamins-Iron (MULTIVITAMINS WITH IRON) TABS Take 1 tablet by mouth daily.   rosuvastatin (CRESTOR) 10 MG tablet Take 1 tablet (10 mg total) by mouth daily.   vitamin B-12 (CYANOCOBALAMIN) 1000 MCG tablet Take 1,000 mcg by mouth daily.    Past Medical History:  Diagnosis Date   Cancer [facility])    skin cancer basal   Carotid arterial disease (HCC)    a. 02/2016 L CEA; b. 01/2017 U/S: patent LICA, 1-39% RICA.   Coronary artery disease    a. 01/2016 MV: mild apical/basal inferior, apical lateral, mid anterolateral, and mid inferolateral ischemia. EF 57%; b. 02/2016 Cath: LM 40ost, LAD 70p/m, 20d, D1 95 (small), LCX nl, RCA 2m, RPDA 90 (small), EF 55-65%-->med Rx. Rec CABG for recurrent symptoms.   History of echocardiogram    a. 02/2016 Echo: EF 50-55%, no rwma, mild MR.   History of kidney stones    Hypercholesterolemia    Hypertension     Past Surgical History:  Procedure Laterality Date   CARDIAC CATHETERIZATION N/A 01/19/2016   Procedure: Left Heart Cath and Coronary Angiography;  Surgeon: Iran Ouch, MD;  Location: [facility] INVASIVE CV LAB;  Service: Cardiovascular;  Laterality: N/A;   COLONOSCOPY      CYSTOSCOPY/URETEROSCOPY/HOLMIUM LASER/STENT PLACEMENT Right 04/13/2021   Procedure: CYSTOSCOPY/URETEROSCOPY/HOLMIUM LASER/STENT PLACEMENT;  Surgeon: Sondra Come, MD;  Location: [facility] ORS;  Service: Urology;  Laterality: Right;   ENDARTERECTOMY Left 02/15/2016   Procedure: ENDARTERECTOMY CAROTID;  Surgeon: Annice Needy, MD;  Location: [facility] ORS;  Service: Vascular;  Laterality: Left;    Social History Social History   Tobacco Use   Smoking status: Never   Smokeless tobacco: Never  Vaping Use   Vaping Use: Never used  Substance Use Topics   Alcohol use: No    Alcohol/week: 0.0 standard drinks of alcohol   Drug use: No    Family History Family History  Problem Relation Age of Onset   Stroke Mother    Heart disease Father        MI   Heart attack Father    Breast cancer Sister    Colon cancer Neg Hx    Prostate cancer Neg Hx     No Known Allergies   REVIEW OF SYSTEMS (Negative unless checked)  Constitutional: [] Weight  loss  [] Fever  [] Chills Cardiac: [] Chest pain   [] Chest pressure   [] Palpitations   [] Shortness of breath when laying flat   [] Shortness of breath with exertion. Vascular:  [x] Pain in legs with walking   [] Pain in legs at rest  [] History of DVT   [] Phlebitis   [] Swelling in legs   [] Varicose veins   [] Non-healing ulcers Pulmonary:   [] Uses home oxygen   [] Productive cough   [] Hemoptysis   [] Wheeze  [] COPD   [] Asthma Neurologic:  [] Dizziness   [] Seizures   [] History of stroke   [] History of TIA  [] Aphasia   [] Vissual changes   [] Weakness or numbness in arm   [] Weakness or numbness in leg Musculoskeletal:   [] Joint swelling   [] Joint pain   [] Low back pain Hematologic:  [] Easy bruising  [] Easy bleeding   [] Hypercoagulable state   [] Anemic Gastrointestinal:  [] Diarrhea   [] Vomiting  [] Gastroesophageal reflux/heartburn   [] Difficulty swallowing. Genitourinary:  [] Chronic kidney disease   [] Difficult urination  [] Frequent urination   [] Blood in urine Skin:   [] Rashes   [] Ulcers  Psychological:  [] History of anxiety   []  History of major depression.  Physical Examination  Vitals:   07/23/22 0404 07/23/22 0923 07/23/22 1235 07/23/22 1725  BP: 120/66 117/70 130/80 130/84  Pulse: (!) 103 94 (!) 102 (!) 103  Resp: 20 20 (!) 22 (!) 21  Temp: 98.8 F (37.1 C) 98.7 F (37.1 C) 99.1 F (37.3 C) 98.5 F (36.9 C)  TempSrc: Oral Oral Axillary Axillary  SpO2: 94% 94% 93% 94%  Weight: 89.9 kg     Height:       Body mass index is 26.87 kg/m. Gen: WD/WN, NAD Head: [place]/AT, No temporalis wasting.  Ear/Nose/Throat: Hearing grossly intact, nares w/o erythema or drainage Eyes: PER, EOMI, sclera nonicteric.  Neck: Supple, no masses.  No bruit or JVD.  Pulmonary:  Good air movement, no audible wheezing, no use of accessory muscles.  Cardiac: RRR, normal S1, S2, no Murmurs. Vascular:   no open wounds of either lower extremity Vessel Right Left  Radial Palpable Palpable  PT Not Palpable Not Palpable  DP Not Palpable Not Palpable  Gastrointestinal: soft, non-distended. No guarding/no peritoneal signs.  Musculoskeletal: Lower extremities are moving but are weak.  No visible deformity.  Neurologic: CN 2-12 intact. Pain and light touch intact in extremities.  Symmetrical.  Speech is fluent. Motor exam as listed above. Psychiatric: Judgment intact, Mood & affect appropriate for pt's clinical situation. Dermatologic: No rashes or ulcers noted.  No changes consistent with cellulitis.   CBC Lab Results  Component Value Date   WBC 7.7 07/23/2022   HGB 12.3 (L) 07/23/2022   HCT 35.4 (L) 07/23/2022   MCV 93.4 07/23/2022   PLT 121 (L) 07/23/2022    BMET    Component Value Date/Time   NA 130 (L) 07/23/2022 0335   NA 141 03/24/2019 1046   K 3.5 07/23/2022 0335   CL 99 07/23/2022 0335   CO2 22 07/23/2022 0335   GLUCOSE 128 (H) 07/23/2022 0335   BUN 32 (H) 07/23/2022 0335   BUN 18 03/24/2019 1046   CREATININE 0.93 07/23/2022 0335   CALCIUM 8.4 (L)  07/23/2022 0335   GFRNONAA >60 07/23/2022 0335   GFRAA >60 09/19/2019 0935   Estimated Creatinine Clearance: 64.9 mL/min (by C-G formula based on SCr of 0.93 mg/dL).  COAG Lab Results  Component Value Date   INR 1.3 (H) 07/23/2022   INR 1.08 02/13/2016  INR 0.97 01/18/2016    Radiology ECHOCARDIOGRAM COMPLETE  Result Date: 07/22/2022    ECHOCARDIOGRAM REPORT   Patient Name:   BIKO ROSENSTEIN Date of Exam: 07/22/2022 Medical Rec #:  [id number]      Height:       72.0 in Accession #:    [id number]     Weight:       194.0 lb Date of Birth:  05/19/1935     BSA:          2.103 m Patient Age:    86 years       BP:           156/67 mmHg Patient Gender: M              HR:           84 bpm. Exam Location:  [facility] Procedure: 2D Echo, Cardiac Doppler and Color Doppler Indications:     CAD native vessel I25.10  History:         Patient has prior history of Echocardiogram examinations, most                  recent 02/08/2016. CAD; Risk Factors:Hypertension.  Sonographer:     Cristela Blue Referring Phys:  [id number] MIECHIA A ESCO Diagnosing Phys: Lorine Bears MD IMPRESSIONS  1. Left ventricular ejection fraction, by estimation, is 55 to 60%. The left ventricle has normal function. The left ventricle has no regional wall motion abnormalities. Left ventricular diastolic parameters are consistent with Grade I diastolic dysfunction (impaired relaxation).  2. Right ventricular systolic function is normal. The right ventricular size is normal. There is normal pulmonary artery systolic pressure.  3. The mitral valve is normal in structure. Mild to moderate mitral valve regurgitation. No evidence of mitral stenosis. Moderate mitral annular calcification.  4. The aortic valve is normal in structure. Aortic valve regurgitation is trivial. Aortic valve sclerosis is present, with no evidence of aortic valve stenosis.  5. The inferior vena cava is normal in size with greater than 50% respiratory variability, suggesting right atrial  pressure of 3 mmHg. FINDINGS  Left Ventricle: Left ventricular ejection fraction, by estimation, is 55 to 60%. The left ventricle has normal function. The left ventricle has no regional wall motion abnormalities. The left ventricular internal cavity size was normal in size. There is  borderline left ventricular hypertrophy. Left ventricular diastolic parameters are consistent with Grade I diastolic dysfunction (impaired relaxation). Right Ventricle: The right ventricular size is normal. No increase in right ventricular wall thickness. Right ventricular systolic function is normal. There is normal pulmonary artery systolic pressure. The tricuspid regurgitant velocity is 2.42 m/s, and  with an assumed right atrial pressure of 3 mmHg, the estimated right ventricular systolic pressure is 26.4 mmHg. Left Atrium: Left atrial size was normal in size. Right Atrium: Right atrial size was normal in size. Pericardium: There is no evidence of pericardial effusion. Mitral Valve: The mitral valve is normal in structure. There is moderate thickening of the mitral valve leaflet(s). There is mild calcification of the mitral valve leaflet(s). Moderate mitral annular calcification. Mild to moderate mitral valve regurgitation. No evidence of mitral valve stenosis. MV peak gradient, 5.2 mmHg. The mean mitral valve gradient is 2.0 mmHg. Tricuspid Valve: The tricuspid valve is normal in structure. Tricuspid valve regurgitation is mild . No evidence of tricuspid stenosis. Aortic Valve: The aortic valve is normal in structure. Aortic valve regurgitation is trivial. Aortic valve sclerosis is present, with no evidence  of aortic valve stenosis. Aortic valve mean gradient measures 5.0 mmHg. Aortic valve peak gradient measures 8.1 mmHg. Aortic valve area, by VTI measures 3.15 cm. Pulmonic Valve: The pulmonic valve was normal in structure. Pulmonic valve regurgitation is not visualized. No evidence of pulmonic stenosis. Aorta: The aortic root is  normal in size and structure. Venous: The inferior vena cava is normal in size with greater than 50% respiratory variability, suggesting right atrial pressure of 3 mmHg. IAS/Shunts: No atrial level shunt detected by color flow Doppler.  LEFT VENTRICLE PLAX 2D LVIDd:         4.80 cm   Diastology LVIDs:         3.50 cm   LV e' medial:    8.16 cm/s LV PW:         1.10 cm   LV E/e' medial:  9.8 LV IVS:        1.00 cm   LV e' lateral:   9.14 cm/s LVOT diam:     2.20 cm   LV E/e' lateral: 8.8 LV SV:         79 LV SV Index:   38 LVOT Area:     3.80 cm  RIGHT VENTRICLE RV Basal diam:  4.60 cm RV Mid diam:    3.10 cm RV S prime:     14.90 cm/s TAPSE (M-mode): 2.4 cm LEFT ATRIUM             Index        RIGHT ATRIUM           Index LA diam:        3.40 cm 1.62 cm/m   RA Area:     14.40 cm LA Vol (A2C):   56.1 ml 26.67 ml/m  RA Volume:   28.90 ml  13.74 ml/m LA Vol (A4C):   41.4 ml 19.68 ml/m LA Biplane Vol: 49.6 ml 23.58 ml/m  AORTIC VALVE AV Area (Vmax):    2.56 cm AV Area (Vmean):   2.64 cm AV Area (VTI):     3.15 cm AV Vmax:           142.00 cm/s AV Vmean:          98.700 cm/s AV VTI:            0.252 m AV Peak Grad:      8.1 mmHg AV Mean Grad:      5.0 mmHg LVOT Vmax:         95.80 cm/s LVOT Vmean:        68.500 cm/s LVOT VTI:          0.209 m LVOT/AV VTI ratio: 0.83  AORTA Ao Root diam: 3.50 cm MITRAL VALVE                TRICUSPID VALVE MV Area (PHT): 4.01 cm     TR Peak grad:   23.4 mmHg MV Area VTI:   2.63 cm     TR Vmax:        242.00 cm/s MV Peak grad:  5.2 mmHg MV Mean grad:  2.0 mmHg     SHUNTS MV Vmax:       1.14 m/s     Systemic VTI:  0.21 m MV Vmean:      60.7 cm/s    Systemic Diam: 2.20 cm MV Decel Time: 189 msec MV E velocity: 80.20 cm/s MV A velocity: 115.00 cm/s MV E/A ratio:  0.70 Lorine Bears MD Electronically signed  by Lorine Bears MD Signature Date/Time: 07/22/2022/11:10:37 AM    Final    DG Chest Portable 1 View  Result Date: 07/20/2022 CLINICAL DATA:  Bilateral lower back pain EXAM:  PORTABLE CHEST 1 VIEW COMPARISON:  Chest radiograph dated 09/19/2019 FINDINGS: Normal lung volumes. No focal consolidations. No pleural effusion or pneumothorax. The heart size and mediastinal contours are within normal limits. Dextrocurvature of the upper thoracic spine may be related to patient positioning. IMPRESSION: No active disease. Electronically Signed   By: Agustin Cree M.D.   On: 07/20/2022 21:29   CT Angio Abd/Pel W and/or Wo Contrast  Result Date: 07/20/2022 CLINICAL DATA:  Aneurysm, renal or visceral. AAA suspected. Bilateral low back pain. EXAM: CTA ABDOMEN AND PELVIS WITHOUT AND WITH CONTRAST TECHNIQUE: Multidetector CT imaging of the abdomen and pelvis was performed using the standard protocol during bolus administration of intravenous contrast. Multiplanar reconstructed images and MIPs were obtained and reviewed to evaluate the vascular anatomy. RADIATION DOSE REDUCTION: This exam was performed according to the departmental dose-optimization program which includes automated exposure control, adjustment of the mA and/or kV according to patient size and/or use of iterative reconstruction technique. CONTRAST:  80mL OMNIPAQUE IOHEXOL 350 MG/ML SOLN COMPARISON:  CT 04/09/2021 FINDINGS: VASCULAR Aorta: Moderate mixed density atherosclerotic plaque in the abdominal aorta. 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 The aorta measures 27 mm in maximum dimension at this level. No aneurysm or dissection. Celiac: No aneurysm or dissection. Predominantly calcified plaque at the origin causes advanced narrowing. SMA: No aneurysm or dissection. Predominantly calcified plaque at the origin causes advanced narrowing. Additional calcified plaque in the proximal SMA causes moderate narrowing. Renals: No aneurysm or dissection. Calcified plaque at the origins of both renal arteries causes advanced  narrowing bilaterally. Accessory left renal artery. IMA: Patent. Inflow: Scattered predominantly calcified atherosclerotic plaque causes multifocal areas of mild narrowing. No aneurysm or dissection. Proximal Outflow: No aneurysm, dissection, or occlusion. Veins: No obvious venous abnormality within the limitations of this arterial phase study. Review of the MIP images confirms the above findings. NON-VASCULAR Lower chest: Bibasilar scarring/atelectasis.  No acute abnormality. Hepatobiliary: Normal parenchymal attenuation and hepatic contour. Unremarkable gallbladder and biliary tree. Pancreas: Unremarkable. Spleen: Unremarkable. Adrenals/Urinary Tract: Stable adrenal glands. No urinary calculi or hydronephrosis. Unremarkable bladder. Stomach/Bowel: Normal caliber large and small bowel. Colonic diverticulosis without diverticulitis. Stomach is within normal limits. Small hiatal hernia. The appendix is not definitively visualized. No secondary signs of appendicitis. Lymphatic: No lymphadenopathy. Reproductive: Unremarkable. Other: No free intraperitoneal fluid or air. Musculoskeletal: Thoracolumbar spondylosis. Intraosseous ganglion cyst as evaluated on MRI 05/25/2021 in the posterior left acetabulum. No acute osseous abnormality. IMPRESSION: 1. 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 No aneurysm or dissection. 2. 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 3. Colonic diverticulosis without diverticulitis. 4. Small hiatal hernia. Aortic Atherosclerosis (ICD10-I70.0). Electronically Signed   By: Minerva Fester M.D.   On: 07/20/2022 17:18     Assessment/Plan 1. Penetrating Abdominal Aortic Ulcer: This pathology remains a possible etiology of current presentation and severe pain but this is not conclusively so.  I have reviewed my discussion with the patient himself since he is so much more awake and alert.   Daughter is also in attendance this evening as well.  Rupture secondary to a penetrating  aortic ulcer would likely be lethal and therefore I believe repair is warranted.  I have personally reviewed his CT and he is a stent graft candidate and I have confirmed that the stents will be arriving around noon time.  I recommend endovascular intervention this admission.  Risks and benefits of been reviewed his daughter was sitting at the bedside.  All questions been answered we will move forward with stent graft placement tomorrow.    2. Known moderate L2-5/S1 Degenerative disc disease:  I would not exclude this as a possible significant etiology for his presentation.  We will continue to evaluate this once his ulcer has been repaired.   3.  Hypertension: Continue antihypertensive medications as already ordered, these medications have been reviewed and there are no changes at this time.    4.  Hyperlipidemia: Continue statin as ordered and reviewed, no changes at this time   Levora Dredge, MD  07/23/2022 7:31 PM

## 2022-07-23 NOTE — Progress Notes (Signed)
Rounding Note    Patient Name: Brett Riley Date of Encounter: 07/23/2022  [facility] Cardiologist: Lorine Bears, MD   Subjective   Patient seen on rounds. Not very talkative this morning.  Daughter remains at bedside.  Continues to await surgery from vascular.  Unfortunately last evening he converted into atrial fibrillation with RVR and required diltiazem and metoprolol.  Rate was controlled after therapies and did not require the initiation of diltiazem drip.  Inpatient Medications    Scheduled Meds:  aspirin EC  81 mg Oral Daily   clopidogrel  75 mg Oral Daily   cyanocobalamin  1,000 mcg Oral Daily   heparin  4,000 Units Intravenous Once   metoprolol succinate  50 mg Oral BID   multivitamin with minerals  1 tablet Oral Daily   rosuvastatin  40 mg Oral Daily   Continuous Infusions:  sodium chloride 100 mL/hr at 07/23/22 1037   heparin     PRN Meds: acetaminophen **OR** acetaminophen, fentaNYL (SUBLIMAZE) injection, hydrALAZINE, magnesium hydroxide, metoprolol tartrate, ondansetron (ZOFRAN) IV, traZODone   Vital Signs    Vitals:   07/23/22 0134 07/23/22 0404 07/23/22 0923 07/23/22 1235  BP: 131/79 120/66 117/70 130/80  Pulse: (!) 121 (!) 103 94 (!) 102  Resp: 18 20 20  (!) 22  Temp: 98.8 F (37.1 C) 98.8 F (37.1 C) 98.7 F (37.1 C) 99.1 F (37.3 C)  TempSrc: Oral Oral Oral Axillary  SpO2: 95% 94% 94% 93%  Weight:  89.9 kg    Height:        Intake/Output Summary (Last 24 hours) at 07/23/2022 1318 Last data filed at 07/23/2022 1025 Gross per 24 hour  Intake 660 ml  Output 550 ml  Net 110 ml      07/23/2022    4:04 AM 07/20/2022    9:07 PM 07/20/2022    2:46 PM  Last 3 Weights  Weight (lbs) 198 lb 1.6 oz 194 lb 197 lb  Weight (kg) 89.858 kg 87.998 kg 89.359 kg      Telemetry    Rate controlled atrial fibrillation 100-120- Personally Reviewed  ECG    No new tracings that are visible- Personally Reviewed  Physical Exam   GEN: No  acute distress.   Neck: No JVD Cardiac: IR IR, no murmurs, rubs, or gallops.  Respiratory: Clear to auscultation bilaterally. GI: Soft, nontender, non-distended  MS: No edema; No deformity. Neuro:  Nonfocal  Psych: Normal affect   Labs    High Sensitivity Troponin:  No results for input(s): "TROPONINIHS" in the last 720 hours.   Chemistry Recent Labs  Lab 07/20/22 1552 07/21/22 0448 07/22/22 0541 07/23/22 0335  NA 136 137 133* 130*  K 3.7 3.9 3.8 3.5  CL 104 105 100 99  CO2 24 23 25 22   GLUCOSE 121* 128* 127* 128*  BUN 25* 24* 28* 32*  CREATININE 0.99 1.04 1.05 0.93  CALCIUM 9.2 9.0 8.7* 8.4*  PROT 7.7  --   --   --   ALBUMIN 4.6  --   --   --   AST 38  --   --   --   ALT 25  --   --   --   ALKPHOS 72  --   --   --   BILITOT 0.9  --   --   --   GFRNONAA >60 >60 >60 >60  ANIONGAP 8 9 8 9     Lipids No results for input(s): "CHOL", "TRIG", "HDL", "LABVLDL", "  LDLCALC", "CHOLHDL" in the last 168 hours.  Hematology Recent Labs  Lab 07/21/22 0448 07/22/22 0541 07/23/22 0335  WBC 17.9* 11.7* 7.7  RBC 4.05* 3.65* 3.79*  HGB 13.2 12.0* 12.3*  HCT 39.4 35.3* 35.4*  MCV 97.3 96.7 93.4  MCH 32.6 32.9 32.5  MCHC 33.5 34.0 34.7  RDW 13.2 13.1 12.7  PLT 145* 124* 121*   Thyroid  Recent Labs  Lab 07/23/22 0335  TSH 2.922    BNP Recent Labs  Lab 07/21/22 0447  BNP 329.1*    DDimer  Recent Labs  Lab 07/20/22 2233  DDIMER 2.03*     Radiology      Cardiac Studies  TTE 07/22/22 1. Left ventricular ejection fraction, by estimation, is 55 to 60%. The  left ventricle has normal function. The left ventricle has no regional  wall motion abnormalities. Left ventricular diastolic parameters are  consistent with Grade I diastolic  dysfunction (impaired relaxation).   2. Right ventricular systolic function is normal. The right ventricular  size is normal. There is normal pulmonary artery systolic pressure.   3. The mitral valve is normal in structure. Mild to  moderate mitral valve  regurgitation. No evidence of mitral stenosis. Moderate mitral annular  calcification.   4. The aortic valve is normal in structure. Aortic valve regurgitation is  trivial. Aortic valve sclerosis is present, with no evidence of aortic  valve stenosis.   5. The inferior vena cava is normal in size with greater than 50%  respiratory variability, suggesting right atrial pressure of 3 mmHg.   Patient Profile     87 y.o. male with a past medical history of coronary artery disease, carotid artery disease status post left carotid endarterectomy, PAD, hypertension, hyperlipidemia, who has been seen and evaluated for cardiac restratification for vascular repair of penetrating aortic ulcer and now new onset atrial fibrillation  Assessment & Plan    New onset atrial fibrillation -Converted into atrial fibrillation last evening at 2258 -Received 10 mg of IV diltiazem and 2 doses of IV metoprolol with rate control -Will be started on heparin infusion for CHA2DS2-VASc score of at least 4 -Toprol-XL 50 mg twice daily (PTA Toprol-XL 100 mg daily, discontinued due to drop in blood pressure and previously) -Potassium 3.5 recommend keeping potassium greater than 4 less than 5 -TSH 2.922 -Checking magnesium level -Continue on telemetry monitoring -Will need to be transition to oral anticoagulant prior to discharge  Coronary disease involving the native coronary arteries without angina -Last heart catheterization showed 70% proximal and mid LAD stenosis in 2017 -Continues to deny chest discomfort -Echocardiogram revealed LVEF 55 to 60% without regional wall motion abnormality -DAPT to be continued upon admission with aspirin and clopidogrel -It is reasonable that clopidogrel can be held for surgeries or procedures as needed -Continue with telemetry monitoring -EKG for pain or changes  Penetrating aortic ulcer -Previously has undergone a risk stratification for procedure  scheduled on 07/24/2022 of endovascular repair by vascular surgery -Ongoing management will be per vascular  Hypertension -Blood pressure 117/70 -Previously was receiving amlodipine, lisinopril, and metoprolol -Restart metoprolol 50 mg twice daily -Vital signs per unit protocol  Hyperlipidemia -With extensive progressive atherosclerosis LDL goal should be less than 55 -Continue rosuvastatin 40 mg daily -As an outpatient consider addition of Zetia 10 mg daily or PCSK9 inhibitor  Carotid artery disease and common iliac artery aneurysm -Continues to be managed by vascular     For questions or updates, please contact [facility] Please consult  [email or web address] for contact info under     CHA2DS2-VASc Score = 4   This indicates a 4.8% annual risk of stroke. The patient's score is based upon: CHF History: 0 HTN History: 1 Diabetes History: 0 Stroke History: 0 Vascular Disease History: 1 Age Score: 2 Gender Score: 0        Signed, Craige Patel, NP  07/23/2022, 1:18 PM

## 2022-07-24 ENCOUNTER — Inpatient Hospital Stay: Payer: Medicare HMO | Admitting: Certified Registered"

## 2022-07-24 ENCOUNTER — Encounter
Admission: EM | Disposition: A | Discharge status: Skilled Nursing Facility | Payer: Self-pay | Source: Home / Self Care | Attending: Internal Medicine

## 2022-07-24 ENCOUNTER — Encounter: Payer: Self-pay | Admitting: Family Medicine

## 2022-07-24 DIAGNOSIS — M549 Dorsalgia, unspecified: Secondary | ICD-10-CM

## 2022-07-24 DIAGNOSIS — I4891 Unspecified atrial fibrillation: Secondary | ICD-10-CM | POA: Diagnosis not present

## 2022-07-24 DIAGNOSIS — I701 Atherosclerosis of renal artery: Secondary | ICD-10-CM | POA: Diagnosis not present

## 2022-07-24 DIAGNOSIS — Z01818 Encounter for other preprocedural examination: Secondary | ICD-10-CM

## 2022-07-24 DIAGNOSIS — I714 Abdominal aortic aneurysm, without rupture, unspecified: Secondary | ICD-10-CM | POA: Diagnosis not present

## 2022-07-24 DIAGNOSIS — I719 Aortic aneurysm of unspecified site, without rupture: Secondary | ICD-10-CM

## 2022-07-24 HISTORY — PX: AORTIC INTERVENTION: CATH118225

## 2022-07-24 LAB — BASIC METABOLIC PANEL WITH GFR
Anion gap: 8 (ref 5–15)
BUN: 30 mg/dL — ABNORMAL HIGH (ref 8–23)
CO2: 22 mmol/L (ref 22–32)
Calcium: 7.9 mg/dL — ABNORMAL LOW (ref 8.9–10.3)
Chloride: 99 mmol/L (ref 98–111)
Creatinine, Ser: 0.84 mg/dL (ref 0.61–1.24)
GFR, Estimated: >60 mL/min
Glucose, Bld: 112 mg/dL — ABNORMAL HIGH (ref 70–99)
Potassium: 3.6 mmol/L (ref 3.5–5.1)
Sodium: 129 mmol/L — ABNORMAL LOW (ref 135–145)

## 2022-07-24 LAB — CBC
HCT: 37.6 % — ABNORMAL LOW (ref 39.0–52.0)
Hemoglobin: 13.1 g/dL (ref 13.0–17.0)
MCH: 32.6 pg (ref 26.0–34.0)
MCHC: 34.8 g/dL (ref 30.0–36.0)
MCV: 93.5 fL (ref 80.0–100.0)
Platelets: 114 10*3/uL — ABNORMAL LOW (ref 150–400)
RBC: 4.02 MIL/uL — ABNORMAL LOW (ref 4.22–5.81)
RDW: 12.8 % (ref 11.5–15.5)
WBC: 6.8 10*3/uL (ref 4.0–10.5)
nRBC: 0 % (ref 0.0–0.2)

## 2022-07-24 LAB — HEPARIN LEVEL (UNFRACTIONATED): Heparin Unfractionated: 0.53 [IU]/mL (ref 0.30–0.70)

## 2022-07-24 SURGERY — AORTIC INTERVENTION
Anesthesia: General

## 2022-07-24 MED ORDER — CHLORHEXIDINE GLUCONATE CLOTH 2 % EX PADS
6.0000 | MEDICATED_PAD | Freq: Every day | CUTANEOUS | Status: DC
  Administered 2022-07-25 – 2022-08-09 (×15): 6 via TOPICAL

## 2022-07-24 MED ORDER — PHENYLEPHRINE HCL (PRESSORS) 10 MG/ML IV SOLN
INTRAVENOUS | Status: DC | PRN
  Administered 2022-07-24 (×3): 80 ug via INTRAVENOUS

## 2022-07-24 MED ORDER — HEPARIN SODIUM (PORCINE) 1000 UNIT/ML IJ SOLN
INTRAMUSCULAR | Status: AC
  Filled 2022-07-24: qty 10

## 2022-07-24 MED ORDER — FENTANYL CITRATE (PF) 100 MCG/2ML IJ SOLN
INTRAMUSCULAR | Status: AC
  Filled 2022-07-24: qty 2

## 2022-07-24 MED ORDER — HEPARIN SODIUM (PORCINE) 1000 UNIT/ML IJ SOLN
INTRAMUSCULAR | Status: DC | PRN
  Administered 2022-07-24: 6000 [IU] via INTRAVENOUS

## 2022-07-24 MED ORDER — ONDANSETRON HCL 4 MG/2ML IJ SOLN: 4.0000 mg | Freq: Once | INTRAMUSCULAR | Status: DC | PRN

## 2022-07-24 MED ORDER — PROPOFOL 10 MG/ML IV BOLUS
INTRAVENOUS | Status: AC
  Filled 2022-07-24: qty 20

## 2022-07-24 MED ORDER — PROPOFOL 10 MG/ML IV BOLUS
INTRAVENOUS | Status: DC | PRN
  Administered 2022-07-24: 100 mg via INTRAVENOUS
  Administered 2022-07-24: 50 mg via INTRAVENOUS

## 2022-07-24 MED ORDER — ONDANSETRON HCL 4 MG/2ML IJ SOLN
4.0000 mg | Freq: Four times a day (QID) | INTRAMUSCULAR | Status: DC | PRN
  Filled 2022-07-24: qty 2

## 2022-07-24 MED ORDER — OXYCODONE HCL 5 MG/5ML PO SOLN
5.0000 mg | Freq: Once | ORAL | Status: DC | PRN
  Filled 2022-07-24: qty 5

## 2022-07-24 MED ORDER — LIDOCAINE HCL (CARDIAC) PF 100 MG/5ML IV SOSY
PREFILLED_SYRINGE | INTRAVENOUS | Status: DC | PRN
  Administered 2022-07-24: 100 mg via INTRAVENOUS

## 2022-07-24 MED ORDER — SEVOFLURANE IN SOLN
RESPIRATORY_TRACT | Status: AC
  Filled 2022-07-24: qty 250

## 2022-07-24 MED ORDER — HYDROMORPHONE HCL 1 MG/ML IJ SOLN
1.0000 mg | Freq: Once | INTRAMUSCULAR | Status: AC | PRN
  Administered 2022-07-25: 1 mg via INTRAVENOUS
  Filled 2022-07-24: qty 1

## 2022-07-24 MED ORDER — DILTIAZEM HCL-DEXTROSE 125-5 MG/125ML-% IV SOLN (PREMIX)
5.0000 mg/h | INTRAVENOUS | Status: DC
  Administered 2022-07-24: 5 mg/h via INTRAVENOUS
  Administered 2022-07-25 (×2): 15 mg/h via INTRAVENOUS
  Filled 2022-07-24 (×3): qty 125

## 2022-07-24 MED ORDER — DEXAMETHASONE SODIUM PHOSPHATE 10 MG/ML IJ SOLN
INTRAMUSCULAR | Status: DC | PRN
  Administered 2022-07-24: 5 mg via INTRAVENOUS

## 2022-07-24 MED ORDER — METOPROLOL SUCCINATE ER 50 MG PO TB24
ORAL_TABLET | ORAL | Status: AC
  Filled 2022-07-24: qty 1

## 2022-07-24 MED ORDER — FENTANYL CITRATE (PF) 100 MCG/2ML IJ SOLN: 25.0000 ug | INTRAMUSCULAR | Status: DC | PRN

## 2022-07-24 MED ORDER — ESMOLOL HCL-SODIUM CHLORIDE 2000 MG/100ML IV SOLN
INTRAVENOUS | Status: DC | PRN
  Administered 2022-07-24: 30 mg via INTRAVENOUS

## 2022-07-24 MED ORDER — CEFAZOLIN SODIUM-DEXTROSE 2-4 GM/100ML-% IV SOLN
INTRAVENOUS | Status: AC
  Filled 2022-07-24: qty 100

## 2022-07-24 MED ORDER — ACETAMINOPHEN 10 MG/ML IV SOLN: 1000.0000 mg | Freq: Once | INTRAVENOUS | Status: DC | PRN

## 2022-07-24 MED ORDER — IODIXANOL 320 MG/ML IV SOLN
INTRAVENOUS | Status: DC | PRN
  Administered 2022-07-24: 45 mL

## 2022-07-24 MED ORDER — FENTANYL CITRATE (PF) 250 MCG/5ML IJ SOLN
INTRAMUSCULAR | Status: DC | PRN
  Administered 2022-07-24 (×4): 25 ug via INTRAVENOUS

## 2022-07-24 MED ORDER — PHENYLEPHRINE HCL-NACL 20-0.9 MG/250ML-% IV SOLN
INTRAVENOUS | Status: DC | PRN
  Administered 2022-07-24: 30 ug/min via INTRAVENOUS

## 2022-07-24 MED ORDER — HEPARIN (PORCINE) 25000 UT/250ML-% IV SOLN
1550.0000 [IU]/h | INTRAVENOUS | Status: DC
  Administered 2022-07-24 – 2022-07-26 (×3): 1550 [IU]/h via INTRAVENOUS
  Filled 2022-07-24 (×3): qty 250

## 2022-07-24 MED ORDER — LACTATED RINGERS IV SOLN: INTRAVENOUS | Status: DC

## 2022-07-24 MED ORDER — LACTATED RINGERS IV SOLN: INTRAVENOUS | Status: DC | PRN

## 2022-07-24 MED ORDER — OXYCODONE HCL 5 MG PO TABS: 5.0000 mg | ORAL_TABLET | Freq: Once | ORAL | Status: DC | PRN

## 2022-07-24 MED ORDER — POTASSIUM CHLORIDE CRYS ER 20 MEQ PO TBCR
EXTENDED_RELEASE_TABLET | ORAL | Status: AC
  Filled 2022-07-24: qty 1

## 2022-07-24 MED ORDER — ONDANSETRON HCL 4 MG/2ML IJ SOLN
INTRAMUSCULAR | Status: DC | PRN
  Administered 2022-07-24: 4 mg via INTRAVENOUS

## 2022-07-24 MED ORDER — DILTIAZEM LOAD VIA INFUSION
10.0000 mg | Freq: Once | INTRAVENOUS | Status: AC
  Administered 2022-07-24: 10 mg via INTRAVENOUS
  Filled 2022-07-24: qty 10

## 2022-07-24 MED ORDER — ROCURONIUM BROMIDE 100 MG/10ML IV SOLN
INTRAVENOUS | Status: DC | PRN
  Administered 2022-07-24: 50 mg via INTRAVENOUS

## 2022-07-24 MED ORDER — SUGAMMADEX SODIUM 200 MG/2ML IV SOLN
INTRAVENOUS | Status: DC | PRN
  Administered 2022-07-24: 200 mg via INTRAVENOUS

## 2022-07-24 SURGICAL SUPPLY — 34 items
ADH SKN CLS APL DERMABOND .7 (GAUZE/BANDAGES/DRESSINGS) ×1
ADPR CATH 9FR SLF ADJ SL SD (SHEATH) ×1
BALLN ATG 14X4X80 (BALLOONS) ×2
CATH ACCU-VU SIZ PIG 5F 70CM (CATHETERS) ×1
CATH BALLN CODA 9X100X32 (BALLOONS) ×1
CATH BEACON 5 .035 65 KMP TIP (CATHETERS) ×1
CLOSURE PERCLOSE PROSTYLE (VASCULAR PRODUCTS) ×3
COVER DRAPE FLUORO 36X44 (DRAPES) ×2
COVER PROBE ULTRASOUND 5X96 (MISCELLANEOUS) ×1
DERMABOND ADVANCED .7 DNX12 (GAUZE/BANDAGES/DRESSINGS) ×1
DEVICE SAFEGUARD 24CM (GAUZE/BANDAGES/DRESSINGS) ×2
DEVICE STARCLOSE SE CLOSURE (Vascular Products) ×1 IMPLANT
ELECT REM PT RETURN 9FT ADLT (ELECTROSURGICAL) ×1
ENSNARE 9-15 (MISCELLANEOUS) ×1
GLIDEWIRE STIFF .35X180X3 HYDR (WIRE) ×1
GLOVE BIO SURGEON STRL SZ 6.5 (GLOVE) ×1
KIT ENCORE 26 ADVANTAGE (KITS) ×2
NEEDLE ENTRY 21GA 7CM ECHOTIP (NEEDLE) ×1
PACK ANGIOGRAPHY (CUSTOM PROCEDURE TRAY) ×1
SET INTRO CAPELLA COAXIAL (SET/KITS/TRAYS/PACK) ×1
SHEATH AFX SYS S1745 (SHEATH) ×1
SHEATH BRITE TIP 6FRX11 (SHEATH) ×2
SHEATH BRITE TIP 7FRX11 (SHEATH) ×1
SHEATH BRITE TIP 8FRX11 (SHEATH) ×1
SPONGE XRAY 4X4 16PLY STRL (MISCELLANEOUS) ×3
STENT GRAFT AF 25X16X100 (Endovascular Graft) ×1 IMPLANT
STENT GRFT SPRCFF 25X75/20X17F (Endovascular Graft) ×1 IMPLANT
SUT MNCRL+ 5-0 UNDYED PC-3 (SUTURE) ×1
SYR MEDRAD MARK 7 150ML (SYRINGE) ×1
TUBING CONTRAST HIGH PRESS 72 (TUBING) ×1
VALVE CHECKFLO PERFORMER (SHEATH) ×1
WIRE G LUND 35X260X7 (WIRE) ×1
WIRE GUIDERIGHT .035X150 (WIRE) ×2
WIRE MAGIC TOR.035 180C (WIRE) ×1

## 2022-07-24 NOTE — Progress Notes (Signed)
Rounding Note    Patient Name: Brett Riley Date of Encounter: 07/24/2022  [facility] Cardiologist: Lorine Bears, MD   Subjective   Feels well without chest pain, shortness of breath, or palpitations.  Only complaint is of some sinus congestion.  Underwent EVAR today by vascular surgery.  Inpatient Medications    Scheduled Meds:  aspirin EC  81 mg Oral Daily   clopidogrel  75 mg Oral Daily   cyanocobalamin  1,000 mcg Oral Daily   diltiazem  10 mg Intravenous Once   metoprolol succinate  50 mg Oral BID   metoprolol succinate       multivitamin with minerals  1 tablet Oral Daily   potassium chloride SA       potassium chloride  20 mEq Oral Daily   rosuvastatin  40 mg Oral Daily   Continuous Infusions:  diltiazem (CARDIZEM) infusion     heparin Stopped (07/24/22 1218)   PRN Meds: acetaminophen **OR** acetaminophen, fentaNYL (SUBLIMAZE) injection, hydrALAZINE, HYDROmorphone (DILAUDID) injection, magnesium hydroxide, metoprolol succinate, metoprolol tartrate, ondansetron (ZOFRAN) IV, ondansetron (ZOFRAN) IV, potassium chloride SA, traZODone   Vital Signs    Vitals:   07/24/22 1515 07/24/22 1530 07/24/22 1556 07/24/22 1600  BP: 133/78 138/76 (!) 153/95 (!) 141/93  Pulse: (!) 131 (!) 134 (!) 143 (!) 128  Resp: 16 16 19 17   Temp:  98 F (36.7 C) 97.7 F (36.5 C)   TempSrc:   Oral   SpO2: 94% 94% 100% 96%  Weight:      Height:        Intake/Output Summary (Last 24 hours) at 07/24/2022 1706 Last data filed at 07/24/2022 1600 Gross per 24 hour  Intake 1744.09 ml  Output 1800 ml  Net -55.91 ml      07/24/2022   12:25 PM 07/23/2022    4:04 AM 07/20/2022    9:07 PM  Last 3 Weights  Weight (lbs) 198 lb 3.1 oz 198 lb 1.6 oz 194 lb  Weight (kg) 89.9 kg 89.858 kg 87.998 kg    Telemetry    Atrial fibrillation with rapid ventricular response, heart rate 100-140 bpm since returning after EVAR- Personally Reviewed  ECG    EKG at 4:43 AM today  demonstrates atrial fibrillation with rapid ventricular response (ventricular rate 105 bpm) and nonspecific T wave abnormality) - Personally Reviewed  Physical Exam   GEN: No acute distress.   Neck: Unable to assess JVP due to supine positioning status post EVAR Cardiac: Tachycardic but regular without murmurs. Respiratory: Clear anteriorly. GI: Soft, nontender, non-distended  MS: No edema; No deformity. Neuro:  Nonfocal  Psych: Normal affect   Labs    High Sensitivity Troponin:  No results for input(s): "TROPONINIHS" in the last 720 hours.   Chemistry Recent Labs  Lab 07/20/22 1552 07/21/22 0448 07/22/22 0541 07/23/22 0335 07/24/22 0613  NA 136   < > 133* 130* 129*  K 3.7   < > 3.8 3.5 3.6  CL 104   < > 100 99 99  CO2 24   < > 25 22 22   GLUCOSE 121*   < > 127* 128* 112*  BUN 25*   < > 28* 32* 30*  CREATININE 0.99   < > 1.05 0.93 0.84  CALCIUM 9.2   < > 8.7* 8.4* 7.9*  MG  --   --   --  2.0  --   PROT 7.7  --   --   --   --  ALBUMIN 4.6  --   --   --   --   AST 38  --   --   --   --   ALT 25  --   --   --   --   ALKPHOS 72  --   --   --   --   BILITOT 0.9  --   --   --   --   GFRNONAA >60   < > >60 >60 >60  ANIONGAP 8   < > 8 9 8    < > = values in this interval not displayed.    Lipids No results for input(s): "CHOL", "TRIG", "HDL", "LABVLDL", "LDLCALC", "CHOLHDL" in the last 168 hours.  Hematology Recent Labs  Lab 07/22/22 0541 07/23/22 0335 07/24/22 0613  WBC 11.7* 7.7 6.8  RBC 3.65* 3.79* 4.02*  HGB 12.0* 12.3* 13.1  HCT 35.3* 35.4* 37.6*  MCV 96.7 93.4 93.5  MCH 32.9 32.5 32.6  MCHC 34.0 34.7 34.8  RDW 13.1 12.7 12.8  PLT 124* 121* 114*   Thyroid  Recent Labs  Lab 07/23/22 0335  TSH 2.922    BNP Recent Labs  Lab 07/21/22 0447  BNP 329.1*    DDimer  Recent Labs  Lab 07/20/22 2233  DDIMER 2.03*     Radiology    PERIPHERAL VASCULAR CATHETERIZATION  Result Date: 07/24/2022 See surgical note for result.   Cardiac Studies   TTE  (07/22/2022):  1. Left ventricular ejection fraction, by estimation, is 55 to 60%. The  left ventricle has normal function. The left ventricle has no regional  wall motion abnormalities. Left ventricular diastolic parameters are  consistent with Grade I diastolic  dysfunction (impaired relaxation).   2. Right ventricular systolic function is normal. The right ventricular  size is normal. There is normal pulmonary artery systolic pressure.   3. The mitral valve is normal in structure. Mild to moderate mitral valve  regurgitation. No evidence of mitral stenosis. Moderate mitral annular  calcification.   4. The aortic valve is normal in structure. Aortic valve regurgitation is  trivial. Aortic valve sclerosis is present, with no evidence of aortic  valve stenosis.   5. The inferior vena cava is normal in size with greater than 50%  respiratory variability, suggesting right atrial pressure of 3 mmHg.   Patient Profile     87 y.o. male with a past medical history of coronary artery disease, carotid artery disease status post left carotid endarterectomy, PAD, hypertension, hyperlipidemia, who has been seen and evaluated for cardiac restratification for vascular repair of penetrating aortic ulcer and now new onset atrial fibrillation.  Assessment & Plan    New onset atrial fibrillation: Patient remains in atrial fibrillation with suboptimal rate control, albeit without symptoms. -Restart heparin infusion when felt safe to do so by vascular surgery. -Initiate diltiazem infusion with bolus for rate control. -Continue metoprolol succinate 50 mg twice daily for now with titration as needed to achieve adequate heart rate control. -Anticipate discharge with DOAC in the setting of CHA2DS2-VASc score of at least 4.  Will need to coordinate with vascular surgery given potential need for concurrent antiplatelet therapy in the setting of EVAR today.  Penetrating ulcer of the abdominal aorta: Back pain  improved.  Patient status post EVAR today. -Ongoing management per vascular surgery. -Continue rosuvastatin 40 mg daily. -Pain control per primary team and vascular surgery.  Hypertension: Blood pressure mildly elevated. -Add diltiazem infusion, as above. -Continue metoprolol and lisinopril.  May  need to decrease/hold lisinopril if hypotension becomes limiting factor in heart rate control.  Hyperlipidemia: -Continue rosuvastatin 40 mg daily.  For questions or updates, please contact [facility] Please consult [email or web address] for contact info under [facility] Cardiology.    Signed, Yvonne Kendall, MD  07/24/2022, 5:06 PM

## 2022-07-24 NOTE — Anesthesia Procedure Notes (Signed)
Procedure Name: Intubation Date/Time: 07/24/2022 1:21 PM  Performed by: Merlene Pulling, CRNAPre-anesthesia Checklist: Patient identified, Patient being monitored, Timeout performed, Emergency Drugs available and Suction available Patient Re-evaluated:Patient Re-evaluated prior to induction Oxygen Delivery Method: Circle system utilized Preoxygenation: Pre-oxygenation with 100% oxygen Induction Type: IV induction Ventilation: Mask ventilation without difficulty Laryngoscope Size: McGraph and 4 Grade View: Grade II Tube type: Oral Tube size: 7.0 mm Number of attempts: 1 Airway Equipment and Method: Stylet Placement Confirmation: ETT inserted through vocal cords under direct vision, positive ETCO2 and breath sounds checked- equal and bilateral Secured at: 21 cm Tube secured with: Tape Dental Injury: Teeth and Oropharynx as per pre-operative assessment

## 2022-07-24 NOTE — Progress Notes (Signed)
This RN spoke with Dr. Gilda Crease via secure chat and asked if patient should have oral medicine this morning prior to surgery. Dr. Gilda Crease stated that he would check with anesthesia. Waiting on response.

## 2022-07-24 NOTE — Progress Notes (Signed)
   07/24/22 0757  Assess: MEWS Score  Pulse Rate (!) 110  ECG Heart Rate (!) 118  Resp 19  Level of Consciousness Alert  SpO2 94 %  Assess: MEWS Score  MEWS Temp 0  MEWS Systolic 0  MEWS Pulse 2  MEWS RR 0  MEWS LOC 0  MEWS Score 2  MEWS Score Color Yellow  Assess: if the MEWS score is Yellow or Red  Were vital signs taken at a resting state? Yes  Does the patient meet 2 or more of the SIRS criteria? No  MEWS guidelines implemented  Yes, yellow  Treat  MEWS Interventions Considered administering scheduled or prn medications/treatments as ordered  Take Vital Signs  Increase Vital Sign Frequency  Yellow: Q2hr x1, continue Q4hrs until patient remains green for 12hrs  Escalate  MEWS: Escalate Yellow: Discuss with charge nurse and consider notifying provider and/or RRT  Notify: Charge Nurse/RN  Name of Charge Nurse/RN Notified Merian Capron, RN  Provider Notification  Provider Name/Title Dr. Meriam Sprague  Date Provider Notified 07/24/22  Time Provider Notified 0801  Method of Notification  (sent secure chat)  Notification Reason Other (Comment) (yellow MEWS)  Provider response Other (Comment) (waiting for response)  Assess: SIRS CRITERIA  SIRS Temperature  0  SIRS Pulse 1  SIRS Respirations  0  SIRS WBC 0  SIRS Score Sum  1   Patient resting in bed. C/o pain 7/10 to back. Will medicate per PRN order. Wife at bedside. Waiting response from Dr. Meriam Sprague. Afib per monitor rate 110-122. BP stable 115/71.

## 2022-07-24 NOTE — Op Note (Signed)
OPERATIVE NOTE   PROCEDURE: US guidance for vascular access, bilateral femoral arteries Catheter placement into aorta from bilateral femoral approaches Placement of a 25 mm x 70 mm with 16 mm x 40 mm limbs Endologix aortic endoprosthesis main body Placement of a 25 mm x 80 mm length aortic extension cuff ProGlide closure devices right femoral artery with a Star close device left common femoral artery.  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  FLUOROSCOPY TIME: 9.7  FINDING(S): 1.  Distal aortic ulceration with aneurysmal changes. 2.  Greater than 70% left renal artery stenosis  SPECIMEN(S):  none  INDICATIONS:   PATRICE ELLERS is a 87 y.o. y.o. male who presents with incapacitating back and abdominal pain.  Work-up demonstrates a penetrating ulcer of the distal infrarenal aorta which is the only abnormality that has been identified.  The patient is a suitable candidate for aortobiiliac stenting using the Endologix stent graft.  Given the risk of rupture particularly given the patient's symptomatic presentation endovascular repair is indicated.  The risks and benefits have been reviewed with the patient alternative therapies including open aortobifemoral bypass was discussed.  All questions have been answered.  The patient and family wishes to proceed with endograft repair.  DESCRIPTION: After obtaining full informed written consent, the patient was brought back to the operating room and placed supine upon the operating table.  The patient received IV antibiotics prior to induction.  After obtaining adequate anesthesia, the patient was prepped and draped in the standard fashion for endovascular  aortoiliac stent graft placement.  Co-surgeons are required because this is a complex bilateral procedure with work being performed simultaneously from both the right femoral and left femoral approach.  This also expedites the procedure making a shorter operative time reducing complications and improving patient safety.  We then began by gaining access to both femoral arteries with US guidance with me working on the patient's left and Dr. Wyn Quaker working on the patient's right.  The femoral arteries were found to be patent and accessed without difficulty with a needle under ultrasound guidance without difficulty on each side and permanent images were recorded.  We then placed 2 proglide devices on the right side in a pre-close fashion followed by placement of an 8 French sheath.  The left side was then accessed with ultrasound as described above and a J-wire was advanced without difficulty.  A 7 French sheath was placed  The patient was then given 6000 units of intravenous heparin.   The Pigtail catheter was placed into the aorta from the right side. Using this image, we selected a 25 mm x 70 mm aortic portion with a 16 mm x 40 mm limbs AFX Endologix device.  A Lunderquist wire was then advanced up the right side.  The delivery sheath was advanced over a stiff wire and position with the tip of the sheath just above the aortic bifurcation.   The 25 mm x 70 mm aortic main body was then placed into the delivery sheath and the contralateral snare wire advanced through the delivery sheath and positioned at the tip of the sheath.  Working from the left side a trilobed snare was advanced through the 7 French sheath and positioned several centimeters above the tip of the  delivery sheath.  The contralateral snare wire was then advanced through the snare and the snare used to capture the contralateral wire.  The wire was then fed from the ipsilateral side while it was pulled out the 7 [place] sheath.  Taking care and making  adjustments to ensure there was no wire wrap.  The Endologix main body was then advanced completely out of the delivery sheath again ensuring that there was no wire wrap.  Subsequently working from both the right and left sides the main body was pulled down to seated squarely on the aortic bifurcation.  The outer yellow catheter was then removed from the contralateral wire opening the contralateral limb of the main body.  Pigtail catheter was then advanced up the main wire into the proximal infrarenal aorta.  Snare wire was then removed.  The main body deployment was then completed.  A Magic torque wire was then advanced through the pigtail catheter on the left side.  Angioplasty balloons were then selected to post dilate the stent graft, a Coda balloon was selected for the aortic portion right limb and a 14 mm x 4 cm noncompliant balloon was selected for the left.     The pigtail catheter was then advanced through the 7 French sheath and positioned just above the renal arteries.  Bolus injection contrast was then performed to image the reconstruction.  Upon review of the bolus injection through the pigtail further stents were needed.  A 25 mm diameter by 80 cm length aortic extension limb was then selected.  Magnified imaging of the left renal arteries was then performed and the extender cuff advanced up the right side.  It was positioned with the fabric just below the accessory renal artery on the left.  It was then deployed in excellent position.  It was postdilated with the [facility] balloon.  Pigtail catheter was then reintroduced through the proximal cuff to the level of the renal arteries and bolus injection of contrast was performed.  Completion imaging demonstrated successful treatment of the aneurysmal disease with complete exclusion of the penetrating ulcer.  No endoleak is identified.  Good flow was seen through the main body as well as both common iliac limbs.  Both hypogastrics are preserved as are  all renal arteries.  At this point we elected to terminate the procedure. We secured the pro glide devices and the StarClose for hemostasis on the femoral arteries. The skin incision was closed with a 4-0 Monocryl. Dermabond and pressure dressing were placed. The patient was taken to the recovery room in stable condition having tolerated the procedure well.  COMPLICATIONS: none  CONDITION: stable  Levora Dredge  07/24/2022, 5:53 PM

## 2022-07-24 NOTE — Anesthesia Preprocedure Evaluation (Addendum)
Anesthesia Evaluation  Patient identified by MRN, date of birth, ID band Patient awake    Reviewed: Allergy & Precautions, NPO status , Patient's Chart, lab work & pertinent test results  History of Anesthesia Complications Negative for: history of anesthetic complications  Airway Mallampati: IV   Neck ROM: Full    Dental  (+) Partial Upper   Pulmonary neg pulmonary ROS   Pulmonary exam normal breath sounds clear to auscultation       Cardiovascular hypertension, + CAD and + Peripheral Vascular Disease (s/p CEA 2017)  + dysrhythmias (a fib, new this admission)  Rhythm:Irregular Rate:Tachycardia  ECG 07/24/22: Atrial fibrillation with rapid ventricular response  Myocardial perfusion 01/18/16:   Note of 1-2 mm slow upsloping ST depression during peak exercise.  Nuclear imaging revealed evidence of ischemia as described below   Test demonstrated moderately impaired functional capacity. Patient achieved 4.6 METs and reached 101% of maximum predicted heart rate. There was note of exaggerated heart rate response to exercise. No chest pain. Note of 1-2 mm slow upsloping ST depression during peak exercise. No significant arrhythmias.   Neuro/Psych negative neurological ROS     GI/Hepatic negative GI ROS,,,  Endo/Other    Renal/GU Renal disease (nephrolithiasis)     Musculoskeletal   Abdominal   Peds  Hematology  (+) Blood dyscrasia, anemia   Anesthesia Other Findings Cardiology note 07/23/22: From a heart standpoint, Brett Riley is now in atrial fibrillation with rapid ventricular response, which is new for him.  Given a CHA2DS2-VASc score of at least 4, he will need long-term anticoagulation.  Will initiate IV heparin in anticipation of EVAR tomorrow.  He should be transition to a DOAC prior to discharge.  Metoprolol dosing has been changed quite a bit over the last day.  We will consolidate this to metoprolol succinate 50  mg twice daily, to be titrated up as necessary to achieve a resting ventricular rate less than 100 bpm.  As long as ventricular rates are relatively well-controlled, Brett Riley can proceed with his aortic intervention as planned tomorrow.   Yvonne Kendall, MD    Reproductive/Obstetrics                             Anesthesia Physical Anesthesia Plan  ASA: 4  Anesthesia Plan: General   Post-op Pain Management:    Induction: Intravenous  PONV Risk Score and Plan: 2 and Ondansetron, Dexamethasone and Treatment may vary due to age or medical condition  Airway Management Planned: Oral ETT  Additional Equipment: Arterial line  Intra-op Plan:   Post-operative Plan: Extubation in OR  Informed Consent: I have reviewed the patients History and Physical, chart, labs and discussed the procedure including the risks, benefits and alternatives for the proposed anesthesia with the patient or authorized representative who has indicated his/her understanding and acceptance.     Dental advisory given  Plan Discussed with: CRNA  Anesthesia Plan Comments: (Patient consented for risks of anesthesia including but not limited to:  - adverse reactions to medications - damage to eyes, teeth, lips or other oral mucosa - nerve damage due to positioning  - sore throat or hoarseness - damage to heart, brain, nerves, lungs, other parts of body or loss of life  Informed patient about role of CRNA in peri- and intra-operative care.  Patient voiced understanding.)        Anesthesia Quick Evaluation

## 2022-07-24 NOTE — Consult Note (Signed)
ANTICOAGULATION CONSULT NOTE - Initial Consult  Pharmacy Consult for Heparin infusion Indication: atrial fibrillation  No Known Allergies  Patient Measurements: Height: 6' (182.9 cm) Weight: 89.9 kg (198 lb 1.6 oz) IBW/kg (Calculated) : 77.6 Heparin Dosing Weight: 89.9 kg  Vital Signs: Temp: 98.8 F (37.1 C) (05/22 0446) Temp Source: Oral (05/22 0446) BP: 103/72 (05/22 0446) Pulse Rate: 104 (05/22 0446)  Labs: Recent Labs    07/22/22 0541 07/23/22 0335 07/23/22 1210 07/23/22 2222 07/24/22 0613  HGB 12.0* 12.3*  --   --  13.1  HCT 35.3* 35.4*  --   --  37.6*  PLT 124* 121*  --   --  114*  APTT  --   --  33  --   --   LABPROT  --   --  15.9*  --   --   INR  --   --  1.3*  --   --   HEPARINUNFRC  --   --   --  0.18* 0.53  CREATININE 1.05 0.93  --   --  0.84     Estimated Creatinine Clearance: 71.9 mL/min (by C-G formula based on SCr of 0.84 mg/dL).  Medical History: Past Medical History:  Diagnosis Date   Cancer (HCC)    skin cancer basal   Carotid arterial disease (HCC)    a. 02/2016 L CEA; b. 01/2017 U/S: patent LICA, 1-39% RICA.   Coronary artery disease    a. 01/2016 MV: mild apical/basal inferior, apical lateral, mid anterolateral, and mid inferolateral ischemia. EF 57%; b. 02/2016 Cath: LM 40ost, LAD 70p/m, 20d, D1 95 (small), LCX nl, RCA 68m, RPDA 90 (small), EF 55-65%-->med Rx. Rec CABG for recurrent symptoms.   History of echocardiogram    a. 02/2016 Echo: EF 50-55%, no rwma, mild MR.   History of kidney stones    Hypercholesterolemia    Hypertension     Assessment: Patient with Hx of CAD, HTN, HDL, and urolithiasis. Scheduled for vascular surgery on 07/24/2022. Cardiology consulted for new onset A.Fib.  Pharmacy to manage heparin infusion until all procedures completed.   Per Dr Asencion Islam on 5/21: "thanks for checking no we will stop the heparin on call"  Goal of Therapy:  Heparin level 0.3-0.7 units/ml Monitor platelets by anticoagulation  protocol: Yes   Plan:  5/22: HL @ 0613 = 0.53, therapeutic X 1  - Will continue pt on current rate and recheck HL in 8 hrs @ 1400.   Damichael Hofman D 07/24/2022 6:56 AM

## 2022-07-24 NOTE — Plan of Care (Signed)
  Problem: Education: Goal: Knowledge of General Education information will improve Description: Including pain rating scale, medication(s)/side effects and non-pharmacologic comfort measures 07/24/2022 2248 by Aretta Nip, RN Outcome: Progressing 07/24/2022 2247 by Aretta Nip, RN Outcome: Progressing   Problem: Health Behavior/Discharge Planning: Goal: Ability to manage health-related needs will improve 07/24/2022 2248 by Aretta Nip, RN Outcome: Progressing 07/24/2022 2247 by Aretta Nip, RN Outcome: Progressing   Problem: Clinical Measurements: Goal: Ability to maintain clinical measurements within normal limits will improve 07/24/2022 2248 by Aretta Nip, RN Outcome: Progressing 07/24/2022 2247 by Aretta Nip, RN Outcome: Progressing Goal: Will remain free from infection 07/24/2022 2248 by Aretta Nip, RN Outcome: Progressing 07/24/2022 2247 by Aretta Nip, RN Outcome: Progressing Goal: Diagnostic test results will improve 07/24/2022 2248 by Aretta Nip, RN Outcome: Progressing 07/24/2022 2247 by Aretta Nip, RN Outcome: Progressing Goal: Respiratory complications will improve 07/24/2022 2248 by Aretta Nip, RN Outcome: Progressing 07/24/2022 2247 by Aretta Nip, RN Outcome: Progressing Goal: Cardiovascular complication will be avoided 07/24/2022 2248 by Aretta Nip, RN Outcome: Progressing 07/24/2022 2247 by Aretta Nip, RN Outcome: Progressing   Problem: Nutrition: Goal: Adequate nutrition will be maintained 07/24/2022 2248 by Aretta Nip, RN Outcome: Progressing 07/24/2022 2247 by Aretta Nip, RN Outcome: Progressing   Problem: Coping: Goal: Level of anxiety will decrease 07/24/2022 2248 by Aretta Nip, RN Outcome: Progressing 07/24/2022 2247 by Aretta Nip, RN Outcome: Progressing   Problem: Elimination: Goal: Will not experience complications related to bowel motility 07/24/2022 2248 by Aretta Nip, RN Outcome: Progressing 07/24/2022 2247 by Aretta Nip, RN Outcome: Progressing Goal: Will not experience complications related to urinary retention 07/24/2022 2248 by Aretta Nip, RN Outcome: Progressing 07/24/2022 2247 by Aretta Nip, RN Outcome: Progressing   Problem: Pain Managment: Goal: General experience of comfort will improve 07/24/2022 2248 by Aretta Nip, RN Outcome: Progressing 07/24/2022 2247 by Aretta Nip, RN Outcome: Progressing   Problem: Safety: Goal: Ability to remain free from injury will improve 07/24/2022 2248 by Aretta Nip, RN Outcome: Progressing 07/24/2022 2247 by Aretta Nip, RN Outcome: Progressing   Problem: Skin Integrity: Goal: Risk for impaired skin integrity will decrease 07/24/2022 2248 by Aretta Nip, RN Outcome: Progressing 07/24/2022 2247 by Aretta Nip, RN Outcome: Progressing   Problem: Education: Goal: Knowledge of disease or condition will improve Outcome: Not Progressing Goal: Understanding of medication regimen will improve Outcome: Not Progressing Goal: Individualized Educational Video(s) Outcome: Not Progressing   Problem: Activity: Goal: Ability to tolerate increased activity will improve Outcome: Not Progressing   Problem: Cardiac: Goal: Ability to achieve and maintain adequate cardiopulmonary perfusion will improve Outcome: Not Progressing   Problem: Health Behavior/Discharge Planning: Goal: Ability to safely manage health-related needs after discharge will improve Outcome: Not Progressing

## 2022-07-24 NOTE — Progress Notes (Signed)
Not of surgery patient checked.  Patient is awake and alert laying in bed with family present.  He has been sipping on clear liquids.  He states he is feeling better.  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

## 2022-07-24 NOTE — Progress Notes (Signed)
Progress Note   Patient: Brett Riley [name] DOB: September 16, 1935 DOA: 07/20/2022     4 DOS: the patient was seen and examined on 07/24/2022    Subjective:  Patient seen and examined this morning in the presence of the wife as well as son-in-law Admits to improvement in abdominal pain as well as back pain Denies nausea vomiting chest pain or cough    Brief Narrative / Hospital Course:  FANUEL DORNBUSH is a 87 y.o. Caucasian male with medical history significant for coronary artery disease, hypertension, dyslipidemia and urolithiasis, who presented to the ER with acute onset of bilateral low back pain that started 07/20/2022 in the morning after he got out of bed.  He was barely able to stand per his wife.  He had nausea around  noon. Came to ED.  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 No aneurysm or dissection. 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 Dr Evie Lacks w/ vascular surgery team was consulted by EDP - Since no other explanation for back pain, aortic ulceration is suspected to be the culprit. Dr. Evie Lacks recs hospitalization, pt admitted to hospitalist service.  05/19: Vasc surg planning endovascular intervention if/when cleared per cardiology, recs for cardiology eval and Echo.  05/20: echocardiogram pending - stable LV systolic function and no new wall motion abnormality --> patient will be able to proceed with endovascular repair without further cardiac intervention  05/21: converted into atrial fibrillation with RVR and required diltiazem and metoprolol.  Rate was controlled after this and did not require diltiazem drip. Starting heparin gtt. Await vascular procedure planned for tomorrow    Consultants:  Vascular surgery Cardiology    Procedures: none    ASSESSMENT & PLAN:   Principal Problem:   Penetrating atherosclerotic ulcer of aorta  (HCC) Active Problems:   Uncontrolled hypertension   Coronary artery disease   Hypoxemia   Preop cardiovascular exam   Dyslipidemia   PVD (peripheral vascular disease) (HCC)   Atrial fibrillation (HCC)     Penetrating atherosclerotic ulcer of aorta (HCC) Pain management aspirin and Plavix. high-dose statin. Strict blood pressure control. Vascular surgery following and planning for repair today   Atrial fibrillation new onset 05/20 22:58 heparin infusion for CHA2DS2-VASc score of at least 4 Toprol-XL 50 mg twice daily (PTA Toprol-XL 100 mg daily, discontinued due to drop in blood pressure) Potassium 3.5 recommend keeping potassium greater than 4 less than 5 TSH 2.922 Checking magnesium level Continue on telemetry monitoring Will need to be transition to oral anticoagulant prior to discharge   SIRS d/t pain/aortic ulcer No infectious source or symptoms WBC to WNL, HR was improving, tachcyardia now d/t afib  Monitor    Hyponatremia Minimal po intake, has been on LR fluids NS fluids for now  Monitor BMP   Essential hypertension Metoprolol for rate control and BP Holding usual home meds d/t soft BP as needed IV labetalol and hydralazine for adequate BP control.   Dyslipidemia increase statin therapy dose.   Coronary artery disease continue aspirin Plavix as well as statin therapy, ACE inhibitor with lisinopril and beta-blocker with Toprol-XL   Hypoxemia early admission - pt is not taking good breaths d/t pain  (+)D-Dimer likely d/t inflammation has not had any dyspnea or cough or wheezing, no chest pain, no pleuritic chest pain Defer PE w/u, Wells 0  Avoid excessive use contrast, pt  also does not want to move for any tests d/t back pain        DVT prophylaxis: Heparin    Code Status: FULL CODE     Current Admission Status: inpatient    TOC needs / Dispo plan: TBD  Barriers to discharge / significant pending items: pending vascular repair aortic ulcer     Family Communication: wife      Physical Exam Constitutional:      General: He is not in acute distress. Cardiovascular:     Rate and Rhythm: Normal rate. Rhythm irregular.     Heart sounds: Normal heart sounds.  Pulmonary:     Effort: Pulmonary effort is normal.     Breath sounds: Normal breath sounds.  Abdominal:     General: Abdomen is flat.     Palpations: Abdomen is soft.  Musculoskeletal:     Right lower leg: No edema.     Left lower leg: No edema.  Neurological:     General: No focal deficit present.     Mental Status: He is alert.  Psychiatric:        Mood and Affect: Mood normal.        Behavior: Behavior normal.         Vitals:   07/24/22 1500 07/24/22 1515 07/24/22 1530 07/24/22 1556  BP: 136/85 133/78 138/76 (!) 153/95  Pulse: (!) 137 (!) 131 (!) 134 (!) 143  Resp: 13 16 16 19   Temp:   98 F (36.7 C) 97.7 F (36.5 C)  TempSrc:    Oral  SpO2: 95% 94% 94% 100%  Weight:      Height:        Data Reviewed: I have personally reviewed patient's laboratory results showing sodium 129 potassium 3.6 creatinine 0.84   Time spent: 55 minutes  Author: Loyce Dys, MD 07/24/2022 4:18 PM  For on call review [email or web address].

## 2022-07-24 NOTE — Interval H&P Note (Signed)
History and Physical Interval Note:  07/24/2022 12:09 PM  Brett Riley  has presented today for surgery, with the diagnosis of AAA.  The various methods of treatment have been discussed with the patient and family. After consideration of risks, benefits and other options for treatment, the patient has consented to  Procedure(s): AORTIC INTERVENTION (N/A) as a surgical intervention.  The patient's history has been reviewed, patient examined, no change in status, stable for surgery.  I have reviewed the patient's chart and labs.  Questions were answered to the patient's satisfaction.     Festus Barren

## 2022-07-24 NOTE — Transfer of Care (Signed)
Immediate Anesthesia Transfer of Care Note  Patient: Brett Riley  Procedure(s) Performed: AORTIC INTERVENTION  Patient Location: PACU  Anesthesia Type:General  Level of Consciousness: drowsy  Airway & Oxygen Therapy: Patient Spontanous Breathing and Patient connected to face mask oxygen  Post-op Assessment: Report given to RN and Post -op Vital signs reviewed and stable  Post vital signs: Reviewed  Last Vitals:  Vitals Value Taken Time  BP 132/75 07/24/22 1545  Temp 36.7 C 07/24/22 1530  Pulse 129 07/24/22 1548  Resp 16 07/24/22 1548  SpO2 95 % 07/24/22 1548  Vitals shown include unvalidated device data.  Last Pain:  Vitals:   07/24/22 1530  TempSrc:   PainSc: 0-No pain      Patients Stated Pain Goal: 0 (07/24/22 1610)  Complications:  Encounter Notable Events  Notable Event Outcome Phase Comment  None  Intraprocedure

## 2022-07-24 NOTE — Op Note (Signed)
OPERATIVE NOTE   PROCEDURE: US guidance for vascular access, bilateral femoral arteries Catheter placement into aorta from bilateral femoral approaches Placement of a 25 mm x 70 mm with 16 mm x 40 mm limbs Endologix aortic endoprosthesis main body right Placement of a 25 mm diameter x 80 mm length suprarenal aortic extension cuff ProGlide closure devices right femoral artery with a Star close device leftcommon femoral artery.  PRE-OPERATIVE DIAGNOSIS: Penetrating aortic ulcer of the distal aorta with aneurysmal degeneration and severe back pain  POST-OPERATIVE DIAGNOSIS: same  SURGEON: Levora Dredge, MD and Festus Barren, MD - Co-surgeons  ANESTHESIA: general  ESTIMATED BLOOD LOSS: 25 cc  CONTRAST: 45 cc  FLUOROSCOPY TIME: 9.7 minutes  FINDING(S): 1.  none  SPECIMEN(S):  none  INDICATIONS:   Brett Riley is a 87 y.o. y.o. male who presents with severe back pain with a CT scan of the abdomen pelvis demonstrating a penetrating distal aortic ulcer with aneurysmal degeneration just above the aortic bifurcation.  This was felt to be a possible cause of his back pain which would be a critical and life-threatening situation suggestive of expansion and possible rupture.  All questions have been answered.  The patient is voicing severe lifestyle limitation and therefore wishes to proceed with endograft repair.  DESCRIPTION: After obtaining full informed written consent, the patient was brought back to the operating room and placed supine upon the operating table.  The patient received IV antibiotics prior to induction.  After obtaining adequate anesthesia, the patient was prepped and draped in the standard fashion for endovascular aortoiliac stent graft placement.  Co-surgeons are required because this is a complex bilateral procedure with work being performed simultaneously from both the right femoral and left femoral approach.  This also expedites the procedure making a shorter  operative time reducing complications and improving patient safety.  We then began by gaining access to both femoral arteries with US guidance with me working on the right and Dr. Gilda Crease working on the left.  The femoral arteries were found to be patent and accessed without difficulty with a needle under ultrasound guidance without difficulty on each side and permanent images were recorded.  We then placed 2 proglide devices on the right side in a pre-close fashion followed by placement of an 8 French sheath.  The left side was then accessed with ultrasound as described above and a J-wire was advanced without difficulty.  A 7 French sheath was placed  The patient was then given  6000 units of intravenous heparin.   The Pigtail catheter was placed into the aorta from the right side. Using this image, we selected a 25 mm diameter proximal 70 cm proximal portion with 16 mm limited by 40 mm distal iliac limb Endologix device device.  A Lunderquist wire was then advanced up the right side.  The delivery sheath was advanced over a stiff wire and position with the tip of the sheath just above the aortic bifurcation.   The above sized Endologix AFX main body was then placed into the delivery sheath and the contralateral snare wire advanced through the delivery sheath and positioned at the tip of the sheath.  Working from the left side a trilobed snare was advanced through the 7 French sheath and positioned several centimeters above the tip of the delivery sheath.  The contralateral snare wire was then advanced through the snare and the snare used to capture the contralateral wire.  The wire was then fed from the ipsilateral  side while it was pulled out the 7 [place] sheath.  Taking care and making adjustments to ensure there was no wire wrap.  The Endologix main body was then advanced completely out of the delivery sheath again ensuring that there was no wire wrap.  Subsequently working from both the right and left  sides the main body was pulled down to seated squarely on the aortic bifurcation.  The outer yellow catheter was then removed from the contralateral wire opening the contralateral limb of the main body.  Pigtail catheter was then advanced up the main wire into the proximal infrarenal aorta.  Snare wire was then removed.  The main body deployment was then completed.  A Magic torque wire was then advanced through the pigtail catheter on the left side.  Angioplasty balloons were then selected to post dilate the stent graft, a large compliant balloon was selected for the right limb and a 14 mm diameter by 4 cm length noncompliant balloon was selected for the left.  Simultaneous inflations were then performed.  The aortic portion of the main body was then dilated with both balloon simultaneously as well.  The pigtail catheter was then advanced through the 7 French sheath and positioned just above the renal arteries.  Bolus injection contrast was then performed to image the reconstruction.  Upon review of the bolus injection through the pigtail further stents were needed.  A 25 mm diameter by 80 cm length aortic extension limb with the suprarenal fixator portion was selected and then placed.  The covered portion of the stent graft was placed just below the accessory left renal artery which was lower of the 2 left renal arteries.  Both main renal arteries had some level of disease worse on the main left renal artery than the right.  Balloon was used to seal proximally as well as the junction point between the aortic cuff and the main body device.  Completion imaging was then performed showing successful exclusion of the penetrating aortic ulcer with aneurysmal degeneration in the distal aorta.  No endoleak was identified on completion imaging.  Good flow was seen throughout the stent graft including both limbs and both hypogastric arteries and all renal arteries continue to have good flow.  At this point we elected to  terminate the procedure. We secured the pro glide devices and the StarClose for hemostasis on the femoral arteries. The skin incision was closed with a 4-0 Monocryl. Dermabond and pressure dressing were placed. The patient was taken to the recovery room in stable condition having tolerated the procedure well.  COMPLICATIONS: none  CONDITION: stable  Festus Barren  07/24/2022, 2:44 PM

## 2022-07-24 NOTE — Progress Notes (Signed)
1908 patient alert x4 HOH on 4l Johnson able to make all needs known on Cardizem gtt in afib hr over 120 increased Cardizem gtt heparin running as ordered. Patient had bilateral PADs in place. CDI no concerns noted

## 2022-07-24 NOTE — Consult Note (Signed)
ANTICOAGULATION CONSULT NOTE Pharmacy Consult for Heparin Infusion Indication: atrial fibrillation  No Known Allergies  Patient Measurements: Height: 6' (182.9 cm) Weight: 89.9 kg (198 lb 3.1 oz) IBW/kg (Calculated) : 77.6 Heparin Dosing Weight: 89.9 kg  Vital Signs: Temp: 97.7 F (36.5 C) (05/22 1556) Temp Source: Oral (05/22 1556) BP: 141/93 (05/22 1600) Pulse Rate: 128 (05/22 1600)  Labs: Recent Labs    07/22/22 0541 07/23/22 0335 07/23/22 1210 07/23/22 2222 07/24/22 0613  HGB 12.0* 12.3*  --   --  13.1  HCT 35.3* 35.4*  --   --  37.6*  PLT 124* 121*  --   --  114*  APTT  --   --  33  --   --   LABPROT  --   --  15.9*  --   --   INR  --   --  1.3*  --   --   HEPARINUNFRC  --   --   --  0.18* 0.53  CREATININE 1.05 0.93  --   --  0.84     Estimated Creatinine Clearance: 71.9 mL/min (by C-G formula based on SCr of 0.84 mg/dL).  Medical History: Past Medical History:  Diagnosis Date   Cancer (HCC)    skin cancer basal   Carotid arterial disease (HCC)    a. 02/2016 L CEA; b. 01/2017 U/S: patent LICA, 1-39% RICA.   Coronary artery disease    a. 01/2016 MV: mild apical/basal inferior, apical lateral, mid anterolateral, and mid inferolateral ischemia. EF 57%; b. 02/2016 Cath: LM 40ost, LAD 70p/m, 20d, D1 95 (small), LCX nl, RCA 38m, RPDA 90 (small), EF 55-65%-->med Rx. Rec CABG for recurrent symptoms.   History of echocardiogram    a. 02/2016 Echo: EF 50-55%, no rwma, mild MR.   History of kidney stones    Hypercholesterolemia    Hypertension    Assessment: Brett Riley is a 87 y.o. male presenting with back pain found to have new onset AF. PMH significant for CAD, HTN, HDL, and urolithiasis. Patient was not on [facility] PTA per chart review. Pharmacy has been consulted to initiate and manage heparin infusion.   Baseline Labs: aPTT 33, PT 15.9, INR 1.3, Hgb 12.3, Hct 35.4, Plt 121   Goal of Therapy:  Heparin level 0.3-0.7 units/ml Monitor platelets by  anticoagulation protocol: Yes   Date Time HL Rate/Comment  5/21 2222 0.18 1200/subtherapeutic 5/22 0613 0.53 1550/therapeutic x1 5/22 1218 --- 0/hold for procedure 5/22 1830 --- 1550/resume after procedure  Plan:  Resume heparin infusion at 1550 units/hr without bolus per vascular Check HL in 8 hours  Continue to monitor H&H and platelets daily while on heparin infusion   Celene Squibb, PharmD PGY1 Pharmacy Resident 07/24/2022 6:05 PM

## 2022-07-25 DIAGNOSIS — I4891 Unspecified atrial fibrillation: Secondary | ICD-10-CM | POA: Diagnosis not present

## 2022-07-25 DIAGNOSIS — I739 Peripheral vascular disease, unspecified: Secondary | ICD-10-CM

## 2022-07-25 DIAGNOSIS — I25118 Atherosclerotic heart disease of native coronary artery with other forms of angina pectoris: Secondary | ICD-10-CM

## 2022-07-25 DIAGNOSIS — I700 Atherosclerosis of aorta: Secondary | ICD-10-CM

## 2022-07-25 DIAGNOSIS — I10 Essential (primary) hypertension: Secondary | ICD-10-CM | POA: Diagnosis not present

## 2022-07-25 DIAGNOSIS — I719 Aortic aneurysm of unspecified site, without rupture: Secondary | ICD-10-CM | POA: Diagnosis not present

## 2022-07-25 LAB — HEPARIN LEVEL (UNFRACTIONATED)
Heparin Unfractionated: 0.47 [IU]/mL (ref 0.30–0.70)
Heparin Unfractionated: 0.61 [IU]/mL (ref 0.30–0.70)

## 2022-07-25 LAB — CBC WITH DIFFERENTIAL/PLATELET
Abs Immature Granulocytes: 0.04 10*3/uL (ref 0.00–0.07)
Basophils Absolute: 0 10*3/uL (ref 0.0–0.1)
Basophils Relative: 0 %
Eosinophils Absolute: 0 10*3/uL (ref 0.0–0.5)
Eosinophils Relative: 0 %
HCT: 36.1 % — ABNORMAL LOW (ref 39.0–52.0)
Hemoglobin: 12.3 g/dL — ABNORMAL LOW (ref 13.0–17.0)
Immature Granulocytes: 1 %
Lymphocytes Relative: 7 %
Lymphs Abs: 0.6 10*3/uL — ABNORMAL LOW (ref 0.7–4.0)
MCH: 32.1 pg (ref 26.0–34.0)
MCHC: 34.1 g/dL (ref 30.0–36.0)
MCV: 94.3 fL (ref 80.0–100.0)
Monocytes Absolute: 0.5 10*3/uL (ref 0.1–1.0)
Monocytes Relative: 5 %
Neutro Abs: 7.7 10*3/uL (ref 1.7–7.7)
Neutrophils Relative %: 87 %
Platelets: 113 10*3/uL — ABNORMAL LOW (ref 150–400)
RBC: 3.83 MIL/uL — ABNORMAL LOW (ref 4.22–5.81)
RDW: 13.2 % (ref 11.5–15.5)
WBC: 8.9 10*3/uL (ref 4.0–10.5)
nRBC: 0 % (ref 0.0–0.2)

## 2022-07-25 LAB — GLUCOSE, CAPILLARY: Glucose-Capillary: 109 mg/dL — ABNORMAL HIGH (ref 70–99)

## 2022-07-25 LAB — BASIC METABOLIC PANEL WITH GFR
Anion gap: 9 (ref 5–15)
BUN: 32 mg/dL — ABNORMAL HIGH (ref 8–23)
CO2: 22 mmol/L (ref 22–32)
Calcium: 7.8 mg/dL — ABNORMAL LOW (ref 8.9–10.3)
Chloride: 97 mmol/L — ABNORMAL LOW (ref 98–111)
Creatinine, Ser: 0.99 mg/dL (ref 0.61–1.24)
GFR, Estimated: >60 mL/min
Glucose, Bld: 138 mg/dL — ABNORMAL HIGH (ref 70–99)
Potassium: 4 mmol/L (ref 3.5–5.1)
Sodium: 128 mmol/L — ABNORMAL LOW (ref 135–145)

## 2022-07-25 MED ORDER — DILTIAZEM HCL 60 MG PO TABS
60.0000 mg | ORAL_TABLET | Freq: Four times a day (QID) | ORAL | Status: DC
  Administered 2022-07-25: 60 mg via ORAL
  Filled 2022-07-25: qty 1

## 2022-07-25 MED ORDER — DILTIAZEM HCL 60 MG PO TABS
90.0000 mg | ORAL_TABLET | Freq: Four times a day (QID) | ORAL | Status: DC
  Administered 2022-07-25 – 2022-07-26 (×4): 90 mg via ORAL
  Filled 2022-07-25 (×5): qty 1

## 2022-07-25 NOTE — Consult Note (Signed)
ANTICOAGULATION CONSULT NOTE Pharmacy Consult for Heparin Infusion Indication: atrial fibrillation  No Known Allergies  Patient Measurements: Height: 6' (182.9 cm) Weight: 89.9 kg (198 lb 3.1 oz) IBW/kg (Calculated) : 77.6 Heparin Dosing Weight: 89.9 kg  Vital Signs: Temp: 97.9 F (36.6 C) (05/22 2317) Temp Source: Oral (05/22 2317) BP: 115/65 (05/23 0200) Pulse Rate: 92 (05/23 0200)  Labs: Recent Labs    07/23/22 0335 07/23/22 1210 07/23/22 2222 07/24/22 0613 07/25/22 0205  HGB 12.3*  --   --  13.1 12.3*  HCT 35.4*  --   --  37.6* 36.1*  PLT 121*  --   --  114* 113*  APTT  --  33  --   --   --   LABPROT  --  15.9*  --   --   --   INR  --  1.3*  --   --   --   HEPARINUNFRC  --   --  0.18* 0.53 0.47  CREATININE 0.93  --   --  0.84 0.99     Estimated Creatinine Clearance: 61 mL/min (by C-G formula based on SCr of 0.99 mg/dL).  Medical History: Past Medical History:  Diagnosis Date   Cancer (HCC)    skin cancer basal   Carotid arterial disease (HCC)    a. 02/2016 L CEA; b. 01/2017 U/S: patent LICA, 1-39% RICA.   Coronary artery disease    a. 01/2016 MV: mild apical/basal inferior, apical lateral, mid anterolateral, and mid inferolateral ischemia. EF 57%; b. 02/2016 Cath: LM 40ost, LAD 70p/m, 20d, D1 95 (small), LCX nl, RCA 72m, RPDA 90 (small), EF 55-65%-->med Rx. Rec CABG for recurrent symptoms.   History of echocardiogram    a. 02/2016 Echo: EF 50-55%, no rwma, mild MR.   History of kidney stones    Hypercholesterolemia    Hypertension    Assessment: Brett Riley is a 87 y.o. male presenting with back pain found to have new onset AF. PMH significant for CAD, HTN, HDL, and urolithiasis. Patient was not on [facility] PTA per chart review. Pharmacy has been consulted to initiate and manage heparin infusion.   Baseline Labs: aPTT 33, PT 15.9, INR 1.3, Hgb 12.3, Hct 35.4, Plt 121   Goal of Therapy:  Heparin level 0.3-0.7 units/ml Monitor platelets by anticoagulation  protocol: Yes   Date Time HL Rate/Comment  5/21 2222 0.18 1200/subtherapeutic 5/22 0613 0.53 1550/therapeutic x1 5/22 1218 --- 0/hold for procedure 5/22 1830 --- 1550/resume after procedure 5/23     0205    0.47     Therapeutic X 1 (post procedure)   Plan:  5/23:  HL @ 0205 = 0.47 , therapeutic X 1  - Will continue pt on current rate and draw confirmation HL in 8 hrs on 5/23 @ 1000.  Continue to monitor H&H and platelets daily while on heparin infusion   Hurman Ketelsen D 07/25/2022 2:43 AM

## 2022-07-25 NOTE — Consult Note (Signed)
ANTICOAGULATION CONSULT NOTE Pharmacy Consult for Heparin Infusion Indication: atrial fibrillation  No Known Allergies  Patient Measurements: Height: 6' (182.9 cm) Weight: 89.9 kg (198 lb 3.1 oz) IBW/kg (Calculated) : 77.6 Heparin Dosing Weight: 89.9 kg  Vital Signs: Temp: 99 F (37.2 C) (05/23 0330) Temp Source: Oral (05/22 2317) BP: 97/68 (05/23 0700) Pulse Rate: 85 (05/23 0700)  Labs: Recent Labs    07/23/22 0335 07/23/22 1210 07/23/22 2222 07/24/22 0613 07/25/22 0205  HGB 12.3*  --   --  13.1 12.3*  HCT 35.4*  --   --  37.6* 36.1*  PLT 121*  --   --  114* 113*  APTT  --  33  --   --   --   LABPROT  --  15.9*  --   --   --   INR  --  1.3*  --   --   --   HEPARINUNFRC  --   --  0.18* 0.53 0.47  CREATININE 0.93  --   --  0.84 0.99     Estimated Creatinine Clearance: 61 mL/min (by C-G formula based on SCr of 0.99 mg/dL).  Medical History: Past Medical History:  Diagnosis Date   Cancer (HCC)    skin cancer basal   Carotid arterial disease (HCC)    a. 02/2016 L CEA; b. 01/2017 U/S: patent LICA, 1-39% RICA.   Coronary artery disease    a. 01/2016 MV: mild apical/basal inferior, apical lateral, mid anterolateral, and mid inferolateral ischemia. EF 57%; b. 02/2016 Cath: LM 40ost, LAD 70p/m, 20d, D1 95 (small), LCX nl, RCA 63m, RPDA 90 (small), EF 55-65%-->med Rx. Rec CABG for recurrent symptoms.   History of echocardiogram    a. 02/2016 Echo: EF 50-55%, no rwma, mild MR.   History of kidney stones    Hypercholesterolemia    Hypertension    Assessment: Brett Riley is a 87 y.o. male presenting with back pain found to have new onset AF. PMH significant for CAD, HTN, HDL, and urolithiasis. Patient was not on [facility] PTA per chart review. Pharmacy has been consulted to initiate and manage heparin infusion.   Baseline Labs: aPTT 33, PT 15.9, INR 1.3, Hgb 12.3, Hct 35.4, Plt 121   Goal of Therapy:  Heparin level 0.3-0.7 units/ml Monitor platelets by anticoagulation  protocol: Yes    Plan: heparin level therapeutic X 2  --Will heparin at 1550 units/hr --will move to once daily heparin levels: next 05/24 am --Continue to monitor H&H and platelets daily while on heparin infusion   Brett Riley 07/25/2022 7:12 AM

## 2022-07-25 NOTE — Progress Notes (Signed)
Pt voided after foley was dc'ed.

## 2022-07-25 NOTE — Plan of Care (Signed)
  Problem: Education: Goal: Knowledge of General Education information will improve Description: Including pain rating scale, medication(s)/side effects and non-pharmacologic comfort measures Outcome: Progressing   Problem: Clinical Measurements: Goal: Ability to maintain clinical measurements within normal limits will improve Outcome: Progressing Goal: Will remain free from infection Outcome: Progressing Goal: Diagnostic test results will improve Outcome: Progressing Goal: Respiratory complications will improve Outcome: Progressing Goal: Cardiovascular complication will be avoided Outcome: Progressing   Problem: Coping: Goal: Level of anxiety will decrease Outcome: Progressing   Pt weaned off Cardizem gtt after started on PO Cardizem. Pt still in Afib with HR  80- low 100's. PRN pain medication given twice for back pain with relief.  Pt still on Heparin gtt at 1550/15 ml/hr.

## 2022-07-25 NOTE — Progress Notes (Signed)
Foley Dc 'ed at 1630, pt yet to void. External male catheter place per pt wife request.

## 2022-07-25 NOTE — Anesthesia Postprocedure Evaluation (Signed)
Anesthesia Post Note  Patient: Brett Riley  Procedure(s) Performed: AORTIC INTERVENTION  Patient location during evaluation: PACU Anesthesia Type: General Level of consciousness: awake and alert, oriented and patient cooperative Pain management: pain level controlled Vital Signs Assessment: post-procedure vital signs reviewed and stable Respiratory status: spontaneous breathing, nonlabored ventilation and respiratory function stable Cardiovascular status: blood pressure returned to baseline and stable Postop Assessment: adequate PO intake Anesthetic complications: no   Encounter Notable Events  Notable Event Outcome Phase Comment  None  Intraprocedure      Last Vitals:  Vitals:   07/25/22 0530 07/25/22 0545  BP: 123/72 124/78  Pulse: (!) 103 97  Resp: 19 (!) 22  Temp:    SpO2: 91% 91%    Last Pain:  Vitals:   07/25/22 0545  TempSrc:   PainSc: 8                  Reed Breech

## 2022-07-25 NOTE — Progress Notes (Signed)
Rounding Note    Patient Name: Brett Riley Date of Encounter: 07/25/2022  [facility] Cardiologist: Lorine Bears, MD   Subjective   No complaints this morning, no groin hematomas noted postprocedure yesterday Remains in atrial fibrillation rate well-controlled on metoprolol succinate 50 twice daily On aspirin, Plavix, heparin infusion Remains on diltiazem infusion for rate contro  Inpatient Medications    Scheduled Meds:  aspirin EC  81 mg Oral Daily   Chlorhexidine Gluconate Cloth  6 each Topical Daily   clopidogrel  75 mg Oral Daily   cyanocobalamin  1,000 mcg Oral Daily   diltiazem  60 mg Oral Q6H   metoprolol succinate  50 mg Oral BID   multivitamin with minerals  1 tablet Oral Daily   potassium chloride  20 mEq Oral Daily   rosuvastatin  40 mg Oral Daily   Continuous Infusions:  heparin 1,550 Units/hr (07/25/22 1505)   PRN Meds: acetaminophen **OR** acetaminophen, fentaNYL (SUBLIMAZE) injection, hydrALAZINE, magnesium hydroxide, metoprolol tartrate, ondansetron (ZOFRAN) IV, ondansetron (ZOFRAN) IV, traZODone   Vital Signs    Vitals:   07/25/22 1200 07/25/22 1300 07/25/22 1400 07/25/22 1500  BP: 133/78 137/77 135/76 126/79  Pulse: 94 94 (!) 101 97  Resp: 19 17 18 17   Temp: 98.5 F (36.9 C)     TempSrc: Oral     SpO2: 93% 94% 94% 96%  Weight:      Height:        Intake/Output Summary (Last 24 hours) at 07/25/2022 1540 Last data filed at 07/25/2022 1505 Gross per 24 hour  Intake 1512.7 ml  Output 1025 ml  Net 487.7 ml      07/24/2022   12:25 PM 07/23/2022    4:04 AM 07/20/2022    9:07 PM  Last 3 Weights  Weight (lbs) 198 lb 3.1 oz 198 lb 1.6 oz 194 lb  Weight (kg) 89.9 kg 89.858 kg 87.998 kg      Telemetry    Atrial fibrillation rate in the 70s- Personally Reviewed  ECG     - Personally Reviewed  Physical Exam   GEN: No acute distress.   Neck: No JVD Cardiac: Irregularly irregular, no murmurs, rubs, or gallops.   Respiratory: Clear to auscultation bilaterally. GI: Soft, nontender, non-distended  MS: No edema; No deformity. Neuro:  Nonfocal  Psych: Normal affect   Labs    High Sensitivity Troponin:  No results for input(s): "TROPONINIHS" in the last 720 hours.   Chemistry Recent Labs  Lab 07/20/22 1552 07/21/22 0448 07/23/22 0335 07/24/22 0613 07/25/22 0205  NA 136   < > 130* 129* 128*  K 3.7   < > 3.5 3.6 4.0  CL 104   < > 99 99 97*  CO2 24   < > 22 22 22   GLUCOSE 121*   < > 128* 112* 138*  BUN 25*   < > 32* 30* 32*  CREATININE 0.99   < > 0.93 0.84 0.99  CALCIUM 9.2   < > 8.4* 7.9* 7.8*  MG  --   --  2.0  --   --   PROT 7.7  --   --   --   --   ALBUMIN 4.6  --   --   --   --   AST 38  --   --   --   --   ALT 25  --   --   --   --   ALKPHOS 72  --   --   --   --  BILITOT 0.9  --   --   --   --   GFRNONAA >60   < > >60 >60 >60  ANIONGAP 8   < > 9 8 9    < > = values in this interval not displayed.    Lipids No results for input(s): "CHOL", "TRIG", "HDL", "LABVLDL", "LDLCALC", "CHOLHDL" in the last 168 hours.  Hematology Recent Labs  Lab 07/23/22 0335 07/24/22 0613 07/25/22 0205  WBC 7.7 6.8 8.9  RBC 3.79* 4.02* 3.83*  HGB 12.3* 13.1 12.3*  HCT 35.4* 37.6* 36.1*  MCV 93.4 93.5 94.3  MCH 32.5 32.6 32.1  MCHC 34.7 34.8 34.1  RDW 12.7 12.8 13.2  PLT 121* 114* 113*   Thyroid  Recent Labs  Lab 07/23/22 0335  TSH 2.922    BNP Recent Labs  Lab 07/21/22 0447  BNP 329.1*    DDimer  Recent Labs  Lab 07/20/22 2233  DDIMER 2.03*     Radiology    PERIPHERAL VASCULAR CATHETERIZATION  Result Date: 07/24/2022 See surgical note for result.   Cardiac Studies   Echo Left ventricular ejection fraction, by estimation, is 55 to 60%. The  left ventricle has normal function. The left ventricle has no regional  wall motion abnormalities. Left ventricular diastolic parameters are  consistent with Grade I diastolic  dysfunction (impaired relaxation).   2. Right  ventricular systolic function is normal. The right ventricular  size is normal. There is normal pulmonary artery systolic pressure.   3. The mitral valve is normal in structure. Mild to moderate mitral valve  regurgitation. No evidence of mitral stenosis. Moderate mitral annular  calcification.   4. The aortic valve is normal in structure. Aortic valve regurgitation is  trivial. Aortic valve sclerosis is present, with no evidence of aortic  valve stenosis.   5. The inferior vena cava is normal in size with greater than 50%  respiratory variability, suggesting right atrial pressure of 3 mmHg.   Patient Profile     87 y.o. male with a past medical history of coronary artery disease, carotid artery disease status post left carotid endarterectomy, PAD, hypertension, hyperlipidemia, who has been seen and evaluated for cardiac restratification for vascular repair of penetrating aortic ulcer and now new onset atrial fibrillation.   Assessment & Plan    Penetrating ulcer of the abdominal aorta/status post EVAR yesterday Successful procedure, no significant complications On aspirin Plavix, statin  New onset atrial fibrillation Rate controlled on diltiazem infusion On heparin infusion Will transition to diltiazem 60 mg every 6 hours and wean off diltiazem infusion If rate trends higher will increase up to 90 mg every 6 hours At discharge consider adding NOAC with Plavix, hold aspirin (will discuss with vascular)  Essential hypertension Lisinopril on hold Diltiazem up to 90 mg every 6 hours with consolidation to diltiazem 300 up to 360 extended release daily at discharge  Hyperlipidemia Crestor 40 daily   Total encounter time more than 50 minutes  Greater than 50% was spent in counseling and coordination of care with the patient   For questions or updates, please contact [facility] Please consult [email or web address] for contact info under        Signed, Julien Nordmann, MD   07/25/2022, 3:40 PM

## 2022-07-25 NOTE — Progress Notes (Signed)
[facility]  Daily Progress Note   Subjective  - 1 Day Post-Op  Sitting up in bed in a much more upright position.  He states his back pain is improved.  He did eat today and did stand at the bedside with a walker.  Interim development of atrial fibrillation.  Cardiology involved.   Objective Vitals:   07/25/22 1700 07/25/22 1730 07/25/22 1800 07/25/22 1809  BP: 124/78 (!) 147/83 (!) 145/84 (!) 145/84  Pulse: (!) 105 (!) 116 (!) 108 98  Resp: 18 20 (!) 22 (!) 21  Temp:      TempSrc:      SpO2: 96% 96% 91% 94%  Weight:      Height:        Intake/Output Summary (Last 24 hours) at 07/25/2022 1824 Last data filed at 07/25/2022 1752 Gross per 24 hour  Intake 1241.37 ml  Output 1600 ml  Net -358.63 ml    PULM  Normal effort , no use of accessory muscles CV  No JVD, RRR Abd      No distended, nontender VASC  Both groins are clean dry and intact no hematoma.  There are 2+ palpable posterior tibial pulses bilaterally  Laboratory CBC    Component Value Date/Time   WBC 8.9 07/25/2022 0205   HGB 12.3 (L) 07/25/2022 0205   HCT 36.1 (L) 07/25/2022 0205   PLT 113 (L) 07/25/2022 0205    BMET    Component Value Date/Time   NA 128 (L) 07/25/2022 0205   NA 141 03/24/2019 1046   K 4.0 07/25/2022 0205   CL 97 (L) 07/25/2022 0205   CO2 22 07/25/2022 0205   GLUCOSE 138 (H) 07/25/2022 0205   BUN 32 (H) 07/25/2022 0205   BUN 18 03/24/2019 1046   CREATININE 0.99 07/25/2022 0205   CALCIUM 7.8 (L) 07/25/2022 0205   GFRNONAA >60 07/25/2022 0205   GFRAA >60 09/19/2019 0935    Assessment/Planning: POD # 1 s/p endovascular repair of 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  Treatment of his atrial fibrillation is deferred to cardiology and much appreciated with their help.  With respect to his anticoagulation Eliquis with aspirin daily would be fine with me.   If Plavix is preferred over aspirin that is also fine with me.  I do not see a need for all 3 given the stent graft was for aneurysmal disease not occlusive disease and it was in the aorta iliacs which are high flow arteries.   Levora Dredge  07/25/2022, 6:24 PM

## 2022-07-25 NOTE — Progress Notes (Signed)
Progress Note   Patient: Brett Riley DOB: [birth year] DOA: 07/20/2022     5 DOS: the patient was seen and examined on 07/25/2022       Subjective:  Patient seen and examined this morning in the presence of the wife  Underwent surgery yesterday He admits to improvement in the back pain today Denies nausea vomiting chest pain or cough       Brief Narrative / Hospital Course:  Brett Riley is a 87 y.o. Caucasian male with medical history significant for coronary artery disease, hypertension, dyslipidemia and urolithiasis, who presented to the ER with acute onset of bilateral low back pain that started 07/20/2022 in the morning after he got out of bed.  He was barely able to stand per his wife.  He had nausea around  noon. Came to ED.  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 No aneurysm or dissection. 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 Dr Evie Lacks w/ vascular surgery team was consulted by EDP - Since no other explanation for back pain, aortic ulceration is suspected to be the culprit. Dr. Evie Lacks recs hospitalization, pt admitted to hospitalist service.  05/19: Vasc surg planning endovascular intervention if/when cleared per cardiology, recs for cardiology eval and Echo.  05/20: echocardiogram pending - stable LV systolic function and no new wall motion abnormality --> patient will be able to proceed with endovascular repair without further cardiac intervention  05/21: converted into atrial fibrillation with RVR and required diltiazem and metoprolol.  Rate was controlled after this and did not require diltiazem drip. Starting heparin gtt. Await vascular procedure planned for tomorrow    Consultants:  Vascular surgery Cardiology    Procedures: 07/24/2022-stent placement by vascular surgeon   ASSESSMENT & PLAN:   Principal Problem:    Penetrating atherosclerotic ulcer of aorta (HCC) Active Problems:   Uncontrolled hypertension   Coronary artery disease   Hypoxemia   Preop cardiovascular exam   Dyslipidemia   PVD (peripheral vascular disease) (HCC)   Atrial fibrillation (HCC)     Penetrating atherosclerotic ulcer of aorta (HCC) Pain management aspirin and Plavix. high-dose statin. Strict blood pressure control. Vascular surgery status post stent placement done on 07/24/2022   Atrial fibrillation new onset 05/20 22:58 heparin infusion for CHA2DS2-VASc score of at least 4 Toprol-XL 50 mg twice daily (PTA Toprol-XL 100 mg daily, discontinued due to drop in blood pressure) Potassium 3.5 recommend keeping potassium greater than 4 less than 5 TSH 2.922 Checking magnesium level Continue on telemetry monitoring Will need to be transition to oral anticoagulant prior to discharge   SIRS d/t pain/aortic ulcer No infectious source or symptoms WBC to WNL, HR was improving, tachcyardia now d/t afib  Monitor    Hyponatremia Minimal po intake, has been on LR fluids NS fluids for now  Monitor BMP   Essential hypertension Metoprolol for rate control and BP Holding usual home meds d/t soft BP as needed IV labetalol and hydralazine for adequate BP control.   Dyslipidemia increase statin therapy dose.   Coronary artery disease continue aspirin Plavix as well as statin therapy, ACE inhibitor with lisinopril and beta-blocker with Toprol-XL   Hypoxemia early admission - pt is not taking good breaths d/t pain  (+)D-Dimer likely d/t inflammation has not had any dyspnea or cough or wheezing, no chest pain, no pleuritic chest pain Defer PE w/u,  Wells 0  Avoid excessive use contrast, pt also does not want to move for any tests d/t back pain        DVT prophylaxis: Heparin     Code Status: FULL CODE      Current Admission Status: inpatient     TOC needs / Dispo plan: TBD   Barriers to discharge / significant  pending items: pending vascular surgery clearance   Family Communication: wife      Physical Exam Constitutional:      General: He is not in acute distress. Cardiovascular:     Rate and Rhythm: Normal rate. Rhythm irregular.     Heart sounds: Normal heart sounds.  Pulmonary:     Effort: Pulmonary effort is normal.     Breath sounds: Normal breath sounds.  Abdominal:     General: Abdomen is flat.     Palpations: Abdomen is soft.  Musculoskeletal:     Right lower leg: No edema.     Left lower leg: No edema.  Neurological:     General: No focal deficit present.     Mental Status: He is alert.  Psychiatric:        Mood and Affect: Mood normal.        Behavior: Behavior normal.        Data Reviewed: I have personally reviewed patient's laboratory results showing sodium 128 potassium 4.0 creatinine 0.9 hemoglobin 12.3   Time spent: 50 minutes     Vitals:   07/25/22 1200 07/25/22 1300 07/25/22 1400 07/25/22 1500  BP: 133/78 137/77 135/76 126/79  Pulse: 94 94 (!) 101 97  Resp: 19 17 18 17   Temp: 98.5 F (36.9 C)     TempSrc: Oral     SpO2: 93% 94% 94% 96%  Weight:      Height:        Author: Loyce Dys, MD 07/25/2022 3:35 PM  For on call review [email or web address].

## 2022-07-26 DIAGNOSIS — I719 Aortic aneurysm of unspecified site, without rupture: Secondary | ICD-10-CM | POA: Diagnosis not present

## 2022-07-26 DIAGNOSIS — I4891 Unspecified atrial fibrillation: Secondary | ICD-10-CM | POA: Diagnosis not present

## 2022-07-26 DIAGNOSIS — I739 Peripheral vascular disease, unspecified: Secondary | ICD-10-CM | POA: Diagnosis not present

## 2022-07-26 DIAGNOSIS — I700 Atherosclerosis of aorta: Secondary | ICD-10-CM | POA: Diagnosis not present

## 2022-07-26 LAB — CBC WITH DIFFERENTIAL/PLATELET
Abs Immature Granulocytes: 0.11 10*3/uL — ABNORMAL HIGH (ref 0.00–0.07)
Basophils Absolute: 0 10*3/uL (ref 0.0–0.1)
Basophils Relative: 0 %
Eosinophils Absolute: 0.1 10*3/uL (ref 0.0–0.5)
Eosinophils Relative: 1 %
HCT: 37.2 % — ABNORMAL LOW (ref 39.0–52.0)
Hemoglobin: 12.7 g/dL — ABNORMAL LOW (ref 13.0–17.0)
Immature Granulocytes: 1 %
Lymphocytes Relative: 18 %
Lymphs Abs: 1.9 10*3/uL (ref 0.7–4.0)
MCH: 32 pg (ref 26.0–34.0)
MCHC: 34.1 g/dL (ref 30.0–36.0)
MCV: 93.7 fL (ref 80.0–100.0)
Monocytes Absolute: 0.5 10*3/uL (ref 0.1–1.0)
Monocytes Relative: 5 %
Neutro Abs: 7.7 10*3/uL (ref 1.7–7.7)
Neutrophils Relative %: 75 %
Platelets: 118 10*3/uL — ABNORMAL LOW (ref 150–400)
RBC: 3.97 MIL/uL — ABNORMAL LOW (ref 4.22–5.81)
RDW: 13.2 % (ref 11.5–15.5)
WBC: 10.3 10*3/uL (ref 4.0–10.5)
nRBC: 0 % (ref 0.0–0.2)

## 2022-07-26 LAB — BASIC METABOLIC PANEL WITH GFR
Anion gap: 6 (ref 5–15)
BUN: 35 mg/dL — ABNORMAL HIGH (ref 8–23)
CO2: 24 mmol/L (ref 22–32)
Calcium: 8 mg/dL — ABNORMAL LOW (ref 8.9–10.3)
Chloride: 100 mmol/L (ref 98–111)
Creatinine, Ser: 0.98 mg/dL (ref 0.61–1.24)
GFR, Estimated: >60 mL/min
Glucose, Bld: 109 mg/dL — ABNORMAL HIGH (ref 70–99)
Potassium: 3.6 mmol/L (ref 3.5–5.1)
Sodium: 130 mmol/L — ABNORMAL LOW (ref 135–145)

## 2022-07-26 LAB — HEPARIN LEVEL (UNFRACTIONATED): Heparin Unfractionated: 0.69 [IU]/mL (ref 0.30–0.70)

## 2022-07-26 MED ORDER — ORAL CARE MOUTH RINSE: 15.0000 mL | OROMUCOSAL | Status: DC | PRN

## 2022-07-26 MED ORDER — APIXABAN 5 MG PO TABS
5.0000 mg | ORAL_TABLET | Freq: Two times a day (BID) | ORAL | Status: DC
  Administered 2022-07-26 – 2022-08-01 (×14): 5 mg via ORAL
  Filled 2022-07-26 (×14): qty 1

## 2022-07-26 MED ORDER — MORPHINE SULFATE ER 15 MG PO TBCR
30.0000 mg | EXTENDED_RELEASE_TABLET | Freq: Two times a day (BID) | ORAL | Status: DC
  Administered 2022-07-26 – 2022-07-27 (×3): 30 mg via ORAL
  Filled 2022-07-26 (×3): qty 2

## 2022-07-26 MED ORDER — DIGOXIN 0.25 MG/ML IJ SOLN
0.2500 mg | INTRAMUSCULAR | Status: AC
  Administered 2022-07-26 (×2): 0.25 mg via INTRAVENOUS
  Filled 2022-07-26 (×2): qty 2

## 2022-07-26 MED ORDER — DILTIAZEM HCL ER COATED BEADS 180 MG PO CP24
360.0000 mg | ORAL_CAPSULE | Freq: Every day | ORAL | Status: DC
  Administered 2022-07-26 – 2022-08-09 (×15): 360 mg via ORAL
  Filled 2022-07-26 (×16): qty 2

## 2022-07-26 MED ORDER — METOPROLOL SUCCINATE ER 100 MG PO TB24
100.0000 mg | ORAL_TABLET | Freq: Two times a day (BID) | ORAL | Status: DC
  Administered 2022-07-26 – 2022-07-27 (×2): 100 mg via ORAL
  Filled 2022-07-26: qty 2
  Filled 2022-07-26: qty 1

## 2022-07-26 NOTE — Progress Notes (Signed)
PT Cancellation Note  Patient Details Name: Brett Riley MRN: [id number] DOB: 1935-12-13   Cancelled Treatment:    Reason Eval/Treat Not Completed: Fatigue/lethargy limiting ability to participate (Consult received and chart reviewed.  Patient sleeping soundly (just finished OT evaluation). Wife requesting therapist allow patient to rest and re-attempt next date as appropriate.  Will continue efforts tomorrow.)   Emeril Stille H. Manson Passey, PT, DPT, NCS 07/26/22, 3:27 PM [phone]

## 2022-07-26 NOTE — Progress Notes (Signed)
[facility]  Daily Progress Note   Subjective  -   Sitting up in bed even more upright than yesterday evening.  He is alert and communicative.  His daughter and granddaughter are with him.  He states his back is still sore but this is very different from the excruciating pain he was having  Objective Vitals:   07/26/22 1400 07/26/22 1500 07/26/22 1502 07/26/22 1700  BP: 126/74 (!) 151/93 (!) 151/93 (!) 153/90  Pulse: (!) 107 (!) 53 (!) 127 (!) 109  Resp: 19 (!) 22 (!) 21 16  Temp:      TempSrc:      SpO2: 93% 90% 93% 94%  Weight:      Height:        Intake/Output Summary (Last 24 hours) at 07/26/2022 1759 Last data filed at 07/26/2022 1535 Gross per 24 hour  Intake 623.22 ml  Output 1400 ml  Net -776.78 ml    PULM  CTAB CV  RRR VASC  feet are warm improved motor Laboratory CBC    Component Value Date/Time   WBC 10.3 07/26/2022 0618   HGB 12.7 (L) 07/26/2022 0618   HCT 37.2 (L) 07/26/2022 0618   PLT 118 (L) 07/26/2022 0618    BMET    Component Value Date/Time   NA 130 (L) 07/26/2022 0618   NA 141 03/24/2019 1046   K 3.6 07/26/2022 0618   CL 100 07/26/2022 0618   CO2 24 07/26/2022 0618   GLUCOSE 109 (H) 07/26/2022 0618   BUN 35 (H) 07/26/2022 0618   BUN 18 03/24/2019 1046   CREATININE 0.98 07/26/2022 0618   CALCIUM 8.0 (L) 07/26/2022 0618   GFRNONAA >60 07/26/2022 0618   GFRAA >60 09/19/2019 0935    Assessment/Planning: POD # 2 s/p endovascular repair of 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  I am not surprised that his back remains sore.  We can advance his activities as tolerated.  His atrial fibrillation seems to be under better control.  We will plan for Plavix and Eliquis at discharge.   Levora Dredge  07/26/2022, 5:59 PM

## 2022-07-26 NOTE — Evaluation (Signed)
Occupational Therapy Evaluation Patient Details Name: Brett Riley MRN: [id number] DOB: 1935/06/27 Today's Date: 07/26/2022   History of Present Illness 87 y.o. male with a past medical history of coronary artery disease, carotid artery disease status post left carotid endarterectomy, PAD, hypertension, hyperlipidemia, who has been seen and evaluated for cardiac restratification for vascular repair of penetrating aortic ulcer and now new onset atrial fibrillation.   Clinical Impression   Patient presenting with decreased Ind in self care,balance, functional mobility/transfers, endurance, and safety awareness. Patient reports being Ind at baseline without use of AD. 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 Pt's supportive wife present and reports being able to assist pt at discharge. Patient with increased back pain with mobility but motivated to attempt working with therapist. Pt needing mod - max A for bed mobility. Pt found to have had BM in bed and stands with max A for ~ 3 minutes while second person assists with hygiene. B knees buckling while standing. Pt taking rest break and then standing and taking 2-3 side steps with max A and continuing B knee buckling before returning to supine. Pt is far from independent baseline at this time. Patient will benefit from acute OT to increase overall independence in the areas of ADLs, functional mobility, and safety awareness in order to safely discharge.      Recommendations for follow up therapy are one component of a multi-disciplinary discharge planning process, led by the attending physician.  Recommendations may be updated based on patient status, additional functional criteria and insurance authorization.   Assistance Recommended at Discharge Intermittent Supervision/Assistance  Patient can return home with the following A lot of help with walking and/or transfers;A lot of help with  bathing/dressing/bathroom;Assistance with cooking/housework;Assist for transportation;Help with stairs or ramp for entrance    Functional Status Assessment  Patient has had a recent decline in their functional status and demonstrates the ability to make significant improvements in function in a reasonable and predictable amount of time.  Equipment Recommendations  Other (comment) (defer to next venue of care)       Precautions / Restrictions Precautions Precautions: Fall      Mobility Bed Mobility Overal bed mobility: Needs Assistance Bed Mobility: Supine to Sit, Sit to Supine     Supine to sit: Mod assist Sit to supine: Max assist   General bed mobility comments: assistance with trunk support and B LEs    Transfers Overall transfer level: Needs assistance Equipment used: Rolling walker (2 wheels) Transfers: Sit to/from Stand Sit to Stand: Max assist                  Balance Overall balance assessment: Needs assistance Sitting-balance support: Feet supported Sitting balance-Leahy Scale: Fair     Standing balance support: Reliant on assistive device for balance, During functional activity, Bilateral upper extremity supported Standing balance-Leahy Scale: Poor                             ADL either performed or assessed with clinical judgement   ADL Overall ADL's : Needs assistance/impaired                                       General ADL Comments: max A to stand from EOB with second helper to assist with hygiene secondary to  B knee buckling.     Vision Patient Visual Report: No change from baseline              Pertinent Vitals/Pain Pain Assessment Pain Assessment: 0-10 Pain Score: 10-Worst pain ever Pain Location: back Pain Descriptors / Indicators: Aching, Discomfort, Grimacing, Guarding Pain Intervention(s): Limited activity within patient's tolerance, Premedicated before session, Repositioned     Hand Dominance  Right   Extremity/Trunk Assessment Upper Extremity Assessment Upper Extremity Assessment: Generalized weakness   Lower Extremity Assessment Lower Extremity Assessment: Generalized weakness       Communication Communication Communication: No difficulties   Cognition Arousal/Alertness: Awake/alert Behavior During Therapy: WFL for tasks assessed/performed Overall Cognitive Status: Within Functional Limits for tasks assessed                                                  Home Living Family/patient expects to be discharged to:: Private residence Living Arrangements: Spouse/significant other Available Help at Discharge: Family;Available 24 hours/day Type of Home: House Home Access: Stairs to enter [employer] of Steps: 2 Entrance Stairs-Rails: Right Home Layout: Two level;Able to live on main level with bedroom/bathroom     Bathroom Shower/Tub: Tub/shower unit         Home Equipment: None          Prior Functioning/Environment Prior Level of Function : Independent/Modified Independent;Working/employed;Driving               ADLs Comments: Pt is very independent. 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        OT Problem List: Decreased strength;Decreased activity tolerance;Decreased safety awareness;Impaired balance (sitting and/or standing);Decreased knowledge of use of DME or AE;Pain      OT Treatment/Interventions: Self-care/ADL training;Therapeutic exercise;Therapeutic activities;Energy conservation;DME and/or AE instruction;Patient/family education;Balance training    OT Goals(Current goals can be found in the care plan section) Acute Rehab OT Goals Patient Stated Goal: to get stronger and return to PLOF OT Goal Formulation: With patient/family Time For Goal Achievement: 08/09/22 Potential to Achieve Goals: Fair ADL Goals Pt Will Perform Grooming: with min assist;standing Pt Will  Perform Lower Body Dressing: with min assist;sit to/from stand Pt Will Transfer to Toilet: with min assist;ambulating Pt Will Perform Toileting - Clothing Manipulation and hygiene: with min assist;sit to/from stand  OT Frequency: Min 3X/week       AM-PAC OT "6 Clicks" Daily Activity     Outcome Measure Help from another person eating meals?: None Help from another person taking care of personal grooming?: A Little Help from another person toileting, which includes using toliet, bedpan, or urinal?: Total Help from another person bathing (including washing, rinsing, drying)?: A Lot Help from another person to put on and taking off regular upper body clothing?: A Lot Help from another person to put on and taking off regular lower body clothing?: Total 6 Click Score: 13   End of Session Equipment Utilized During Treatment: Rolling walker (2 wheels) Nurse Communication: Mobility status  Activity Tolerance: Patient limited by fatigue Patient left: in bed;with call bell/phone within reach;with bed alarm set;with nursing/sitter in room;with family/visitor present  OT Visit Diagnosis: Unsteadiness on feet (R26.81);Muscle weakness (generalized) (M62.81);Pain Pain - part of body:  (back)                Time: 1433-1500 OT  Time Calculation (min): 27 min Charges:  OT General Charges $OT Visit: 1 Visit OT Evaluation $OT Eval Moderate Complexity: 1 Mod OT Treatments $Self Care/Home Management : 8-22 mins  Jackquline Denmark, MS, OTR/L , CBIS ascom [phone]  07/26/22, 4:08 PM

## 2022-07-26 NOTE — Progress Notes (Signed)
Progress Note   Patient: Brett Riley [name] DOB: 1936/01/24 DOA: 07/20/2022     6 DOS: the patient was seen and examined on 07/26/2022     Subjective:  Patient seen and examined this morning in the presence of the wife  He had complained of back pain although this is not worsening he was requiring more of his intermittent pain medication and as such baseline pain control is needed and so I have added MS Contin today Denies nausea vomiting chest pain or cough       Brief Narrative / Hospital Course:  Brett Riley is a 87 y.o. Caucasian male with medical history significant for coronary artery disease, hypertension, dyslipidemia and urolithiasis, who presented to the ER with acute onset of bilateral low back pain that started 07/20/2022 in the morning after he got out of bed.  He was barely able to stand per his wife.  He had nausea around  noon. Came to ED.  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 No aneurysm or dissection. 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 Dr Brett Riley w/ vascular surgery team was consulted by EDP - Since no other explanation for back pain, aortic ulceration is suspected to be the culprit. Dr. Evie Riley recs hospitalization, pt admitted to hospitalist service.  05/19: Vasc surg planning endovascular intervention if/when cleared per cardiology, recs for cardiology eval and Echo.  05/20: echocardiogram pending - stable LV systolic function and no new wall motion abnormality --> patient will be able to proceed with endovascular repair without further cardiac intervention  05/21: converted into atrial fibrillation with RVR and required diltiazem and metoprolol.  Rate was controlled after this and did not require diltiazem drip. Starting heparin gtt. Await vascular procedure planned for tomorrow    Consultants:  Vascular  surgery Cardiology    Procedures: 07/24/2022-stent placement by vascular surgeon   ASSESSMENT & PLAN:   Principal Problem:   Penetrating atherosclerotic ulcer of aorta (HCC) Active Problems:   Uncontrolled hypertension   Coronary artery disease   Hypoxemia   Preop cardiovascular exam   Dyslipidemia   PVD (peripheral vascular disease) (HCC)   Atrial fibrillation (HCC)     Penetrating atherosclerotic ulcer of aorta (HCC) Pain management aspirin and Plavix. high-dose statin. Strict blood pressure control. Vascular surgery status post stent placement done on 07/24/2022   Atrial fibrillation new onset 05/20 22:58 heparin infusion for CHA2DS2-VASc score of at least 4 Toprol-XL 50 mg twice daily (PTA Toprol-XL 100 mg daily, discontinued due to drop in blood pressure) Potassium 3.5 recommend keeping potassium greater than 4 less than 5 TSH 2.922 Continue on telemetry monitoring Will need to be transition to oral anticoagulant prior to discharge   SIRS d/t pain/aortic ulcer No infectious source or symptoms WBC to WNL, HR was improving, tachcyardia now d/t afib  Monitor    Hyponatremia Minimal po intake, has been on LR fluids Status post IV fluid Monitor BMP   Essential hypertension Metoprolol for rate control and BP Holding usual home meds d/t soft BP as needed IV labetalol and hydralazine for adequate BP control.   Dyslipidemia increase statin therapy dose.   Coronary artery disease continue aspirin Plavix as well as statin therapy, ACE inhibitor with lisinopril and beta-blocker with Toprol-XL   Hypoxemia early admission - pt is not taking good breaths d/t pain  (+)D-Dimer likely d/t inflammation has  not had any dyspnea or cough or wheezing, no chest pain, no pleuritic chest pain Defer PE w/u, Wells 0  Avoid excessive use contrast, pt also does not want to move for any tests d/t back pain        DVT prophylaxis: Heparin     Code Status: FULL CODE       Current Admission Status: inpatient     TOC needs / Dispo plan: TBD   Barriers to discharge / significant pending items: pending vascular surgery clearance   Family Communication: wife      Physical Exam Constitutional:      General: He is not in acute distress. Cardiovascular:     Rate and Rhythm: Normal rate. Rhythm irregular.     Heart sounds: Normal heart sounds.  Pulmonary:     Effort: Pulmonary effort is normal.     Breath sounds: Normal breath sounds.  Abdominal:     General: Abdomen is flat.     Palpations: Abdomen is soft.  Musculoskeletal:     Right lower leg: No edema.     Left lower leg: No edema.  Neurological:     General: No focal deficit present.     Mental Status: He is alert.  Psychiatric:        Mood and Affect: Mood normal.        Behavior: Behavior normal.        Data Reviewed: I have personally reviewed patient's laboratory results showing sodium 130 potassium 3.6 creatinine 0.98 WBC 10.3 hemoglobin 12.7   Time spent: 45 minutes      Vitals:   07/26/22 1300 07/26/22 1400 07/26/22 1500 07/26/22 1502  BP: (!) 146/72 126/74 (!) 151/93 (!) 151/93  Pulse: (!) 130 (!) 107 (!) 53 (!) 127  Resp: (!) 21 19 (!) 22 (!) 21  Temp:      TempSrc:      SpO2: 96% 93% 90% 93%  Weight:      Height:         Author: Loyce Dys, MD 07/26/2022 5:36 PM  For on call review [email or web address].

## 2022-07-26 NOTE — Progress Notes (Signed)
Cardiology Progress Note   Patient Name: Brett Riley Date of Encounter: 07/26/2022  Primary Cardiologist: Lorine Bears, MD  Subjective   C/o back pain - 2/10 this AM.  Remains in Afib, rates sustaining in the 120's this AM.  Inpatient Medications    Scheduled Meds:  aspirin EC  81 mg Oral Daily   Chlorhexidine Gluconate Cloth  6 each Topical Daily   clopidogrel  75 mg Oral Daily   cyanocobalamin  1,000 mcg Oral Daily   digoxin  0.25 mg Intravenous Q4H   diltiazem  360 mg Oral Daily   metoprolol succinate  100 mg Oral BID   morphine  30 mg Oral Q12H   multivitamin with minerals  1 tablet Oral Daily   potassium chloride  20 mEq Oral Daily   rosuvastatin  40 mg Oral Daily   Continuous Infusions:  heparin 1,550 Units/hr (07/26/22 1148)   PRN Meds: acetaminophen **OR** acetaminophen, fentaNYL (SUBLIMAZE) injection, hydrALAZINE, magnesium hydroxide, metoprolol tartrate, ondansetron (ZOFRAN) IV, ondansetron (ZOFRAN) IV, traZODone   Vital Signs    Vitals:   07/26/22 0900 07/26/22 1000 07/26/22 1100 07/26/22 1200  BP: (!) 146/85 (!) 160/75 130/84 (!) 142/95  Pulse: (!) 120 (!) 126 (!) 124 (!) 110  Resp: 18 (!) 22 18 19   Temp:      TempSrc:      SpO2: 92% 90% 93% 94%  Weight:      Height:        Intake/Output Summary (Last 24 hours) at 07/26/2022 1325 Last data filed at 07/26/2022 1148 Gross per 24 hour  Intake 671 ml  Output 1200 ml  Net -529 ml   Filed Weights   07/20/22 2107 07/23/22 0404 07/24/22 1225  Weight: 88 kg 89.9 kg 89.9 kg    Physical Exam   GEN: Well nourished, well developed, in no acute distress.  HEENT: Grossly normal.  Neck: Supple, no JVD, carotid bruits, or masses. Cardiac: IR, IR, no murmurs, rubs, or gallops. No clubbing, cyanosis, edema.  Radials 2+, DP/PT 2+ and equal bilaterally.  Respiratory:  Respirations regular and unlabored, clear to auscultation bilaterally. GI: Soft, nontender, nondistended, BS + x 4. MS: no deformity or  atrophy. Skin: warm and dry, no rash. Neuro:  Strength and sensation are intact. Psych: AAOx3.  Normal affect.  Labs    Chemistry Recent Labs  Lab 07/20/22 1552 07/21/22 0448 07/24/22 1610 07/25/22 0205 07/26/22 0618  NA 136   < > 129* 128* 130*  K 3.7   < > 3.6 4.0 3.6  CL 104   < > 99 97* 100  CO2 24   < > 22 22 24   GLUCOSE 121*   < > 112* 138* 109*  BUN 25*   < > 30* 32* 35*  CREATININE 0.99   < > 0.84 0.99 0.98  CALCIUM 9.2   < > 7.9* 7.8* 8.0*  PROT 7.7  --   --   --   --   ALBUMIN 4.6  --   --   --   --   AST 38  --   --   --   --   ALT 25  --   --   --   --   ALKPHOS 72  --   --   --   --   BILITOT 0.9  --   --   --   --   GFRNONAA >60   < > >60 >60 >60  ANIONGAP 8   < >  8 9 6    < > = values in this interval not displayed.     Hematology Recent Labs  Lab 07/24/22 0613 07/25/22 0205 07/26/22 0618  WBC 6.8 8.9 10.3  RBC 4.02* 3.83* 3.97*  HGB 13.1 12.3* 12.7*  HCT 37.6* 36.1* 37.2*  MCV 93.5 94.3 93.7  MCH 32.6 32.1 32.0  MCHC 34.8 34.1 34.1  RDW 12.8 13.2 13.2  PLT 114* 113* 118*   BNP    Component Value Date/Time   BNP 329.1 (H) 07/21/2022 0447    DDimer  Recent Labs  Lab 07/20/22 2233  DDIMER 2.03*     Lipids  Lab Results  Component Value Date   CHOL 129 06/06/2022   HDL 34.80 (L) 06/06/2022   LDLCALC 62 06/06/2022   TRIG 161.0 (H) 06/06/2022   CHOLHDL 4 06/06/2022   Radiology    -----------  Telemetry    Aifb, up to 120's  - Personally Reviewed  Cardiac Studies   2D Echocardiogram 5.20.2024   1. Left ventricular ejection fraction, by estimation, is 55 to 60%. The  left ventricle has normal function. The left ventricle has no regional  wall motion abnormalities. Left ventricular diastolic parameters are  consistent with Grade I diastolic  dysfunction (impaired relaxation).   2. Right ventricular systolic function is normal. The right ventricular  size is normal. There is normal pulmonary artery systolic pressure.   3.  The mitral valve is normal in structure. Mild to moderate mitral valve  regurgitation. No evidence of mitral stenosis. Moderate mitral annular  calcification.   4. The aortic valve is normal in structure. Aortic valve regurgitation is  trivial. Aortic valve sclerosis is present, with no evidence of aortic  valve stenosis.   5. The inferior vena cava is normal in size with greater than 50%  respiratory variability, suggesting right atrial pressure of 3 mmHg.    Patient Profile     87 y.o. male with a past medical history of coronary artery disease, carotid artery disease status post left carotid endarterectomy, PAD, hypertension, hyperlipidemia, who has been seen and evaluated for cardiac restratification for vascular repair of penetrating aortic ulcer and now new onset atrial fibrillation.   Assessment & Plan    1.  Penetrating ulcer of the abdominal aorta: Status post EVAR on May 22.  Currently on aspirin and Plavix.  Transitioning heparin to Eliquis today and therefore will discontinue Plavix as recommended by vascular surgery.  2.  Persistent atrial fibrillation: Patient developed atrial fibrillation early on May 21.  Based on records, it appears that he was likely in atrial fibrillation for nearly 12 hours prior to initiation of heparin and then required a 6-hour heparin hold for his procedure on May 22.  He has remained in atrial fibrillation despite titration of calcium channel blocker and beta-blocker therapy.  Rates this morning in the 120s in the setting of back pain.  Transitioning his diltiazem to CD at 360 mg daily.  Will also increase his metoprolol to 100 mg twice daily and provide IV digoxin this morning.  Ultimately, we will need to consider TEE and cardioversion on Monday.  Will transition to Eliquis today.  3.  Essential HTN: Currently stable.  4.  Hyperlipidemia: Continue statin therapy.  LDL of 62.  Signed, Nicolasa Ducking, NP  07/26/2022, 1:25 PM    For questions or  updates, please contact   Please consult [email or web address] for contact info under Cardiology/STEMI.

## 2022-07-26 NOTE — Consult Note (Signed)
ANTICOAGULATION CONSULT NOTE Pharmacy Consult for Heparin Infusion Indication: atrial fibrillation  No Known Allergies  Patient Measurements: Height: 6' (182.9 cm) Weight: 89.9 kg (198 lb 3.1 oz) IBW/kg (Calculated) : 77.6 Heparin Dosing Weight: 89.9 kg  Vital Signs: Temp: 98.2 F (36.8 C) (05/24 0400) Temp Source: Oral (05/24 0400) BP: 132/78 (05/24 0700) Pulse Rate: 113 (05/24 0700)  Labs: Recent Labs    07/23/22 1210 07/23/22 2222 07/24/22 0613 07/25/22 0205 07/25/22 1000 07/26/22 0618  HGB  --    < > 13.1 12.3*  --  12.7*  HCT  --   --  37.6* 36.1*  --  37.2*  PLT  --   --  114* 113*  --  118*  APTT 33  --   --   --   --   --   LABPROT 15.9*  --   --   --   --   --   INR 1.3*  --   --   --   --   --   HEPARINUNFRC  --    < > 0.53 0.47 0.61  --   CREATININE  --   --  0.84 0.99  --  0.98   < > = values in this interval not displayed.     Estimated Creatinine Clearance: 61.6 mL/min (by C-G formula based on SCr of 0.98 mg/dL).  Medical History: Past Medical History:  Diagnosis Date   Cancer (HCC)    skin cancer basal   Carotid arterial disease (HCC)    a. 02/2016 L CEA; b. 01/2017 U/S: patent LICA, 1-39% RICA.   Coronary artery disease    a. 01/2016 MV: mild apical/basal inferior, apical lateral, mid anterolateral, and mid inferolateral ischemia. EF 57%; b. 02/2016 Cath: LM 40ost, LAD 70p/m, 20d, D1 95 (small), LCX nl, RCA 57m, RPDA 90 (small), EF 55-65%-->med Rx. Rec CABG for recurrent symptoms.   History of echocardiogram    a. 02/2016 Echo: EF 50-55%, no rwma, mild MR.   History of kidney stones    Hypercholesterolemia    Hypertension    Assessment: Brett Riley is a 87 y.o. male presenting with back pain found to have new onset AF. PMH significant for CAD, HTN, HDL, and urolithiasis. Patient was not on [facility] PTA per chart review. Pharmacy has been consulted to initiate and manage heparin infusion.   Baseline Labs: aPTT 33, PT 15.9, INR 1.3, Hgb 12.3, Hct  35.4, Plt 121   Goal of Therapy:  Heparin level 0.3-0.7 units/ml Monitor platelets by anticoagulation protocol: Yes  Plan: heparin level remains therapeutic ---continue heparin at 1550 units/hr ---continue to monitor H&H and platelets daily while on heparin infusion   [name] 07/26/2022 7:09 AM

## 2022-07-27 ENCOUNTER — Inpatient Hospital Stay: Payer: Medicare HMO

## 2022-07-27 DIAGNOSIS — R0989 Other specified symptoms and signs involving the circulatory and respiratory systems: Secondary | ICD-10-CM | POA: Diagnosis not present

## 2022-07-27 DIAGNOSIS — I719 Aortic aneurysm of unspecified site, without rupture: Secondary | ICD-10-CM | POA: Diagnosis not present

## 2022-07-27 LAB — BASIC METABOLIC PANEL WITH GFR
Anion gap: 6 (ref 5–15)
BUN: 29 mg/dL — ABNORMAL HIGH (ref 8–23)
CO2: 23 mmol/L (ref 22–32)
Calcium: 7.8 mg/dL — ABNORMAL LOW (ref 8.9–10.3)
Chloride: 99 mmol/L (ref 98–111)
Creatinine, Ser: 0.92 mg/dL (ref 0.61–1.24)
GFR, Estimated: >60 mL/min
Glucose, Bld: 111 mg/dL — ABNORMAL HIGH (ref 70–99)
Potassium: 4.1 mmol/L (ref 3.5–5.1)
Sodium: 128 mmol/L — ABNORMAL LOW (ref 135–145)

## 2022-07-27 LAB — CBC WITH DIFFERENTIAL/PLATELET
Abs Immature Granulocytes: 0.16 10*3/uL — ABNORMAL HIGH (ref 0.00–0.07)
Basophils Absolute: 0 10*3/uL (ref 0.0–0.1)
Basophils Relative: 0 %
Eosinophils Absolute: 0.1 10*3/uL (ref 0.0–0.5)
Eosinophils Relative: 1 %
HCT: 35.8 % — ABNORMAL LOW (ref 39.0–52.0)
Hemoglobin: 12.2 g/dL — ABNORMAL LOW (ref 13.0–17.0)
Immature Granulocytes: 2 %
Lymphocytes Relative: 17 %
Lymphs Abs: 1.7 10*3/uL (ref 0.7–4.0)
MCH: 31.9 pg (ref 26.0–34.0)
MCHC: 34.1 g/dL (ref 30.0–36.0)
MCV: 93.7 fL (ref 80.0–100.0)
Monocytes Absolute: 0.5 10*3/uL (ref 0.1–1.0)
Monocytes Relative: 5 %
Neutro Abs: 7.7 10*3/uL (ref 1.7–7.7)
Neutrophils Relative %: 75 %
Platelets: 132 10*3/uL — ABNORMAL LOW (ref 150–400)
RBC: 3.82 MIL/uL — ABNORMAL LOW (ref 4.22–5.81)
RDW: 13.4 % (ref 11.5–15.5)
WBC: 10.1 10*3/uL (ref 4.0–10.5)
nRBC: 0 % (ref 0.0–0.2)

## 2022-07-27 LAB — SODIUM, URINE, RANDOM: Sodium, Ur: 59 mmol/L

## 2022-07-27 LAB — OSMOLALITY, URINE: Osmolality, Ur: 520 mosm/kg (ref 300–900)

## 2022-07-27 LAB — OSMOLALITY: Osmolality: 277 mosm/kg (ref 275–295)

## 2022-07-27 MED ORDER — METOPROLOL SUCCINATE ER 50 MG PO TB24
50.0000 mg | ORAL_TABLET | Freq: Two times a day (BID) | ORAL | Status: DC
  Administered 2022-07-27 – 2022-08-09 (×26): 50 mg via ORAL
  Filled 2022-07-27 (×26): qty 1

## 2022-07-27 MED ORDER — SALINE SPRAY 0.65 % NA SOLN
1.0000 | NASAL | Status: DC | PRN
  Administered 2022-07-27: 1 via NASAL
  Filled 2022-07-27: qty 44

## 2022-07-27 MED ORDER — AMIODARONE HCL 200 MG PO TABS
400.0000 mg | ORAL_TABLET | Freq: Two times a day (BID) | ORAL | Status: DC
  Administered 2022-07-27 – 2022-07-29 (×5): 400 mg via ORAL
  Filled 2022-07-27 (×5): qty 2

## 2022-07-27 MED ORDER — SODIUM CHLORIDE 1 G PO TABS
1.0000 g | ORAL_TABLET | Freq: Two times a day (BID) | ORAL | Status: DC
  Administered 2022-07-27 – 2022-08-05 (×19): 1 g via ORAL
  Filled 2022-07-27 (×19): qty 1

## 2022-07-27 NOTE — Evaluation (Addendum)
Physical Therapy Evaluation Patient Details Name: Brett Riley MRN: [id number] DOB: May 21, 1935 Today's Date: 07/27/2022  History of Present Illness  87 y.o. male with a past medical history of coronary artery disease, carotid artery disease status post left carotid endarterectomy, PAD, hypertension, hyperlipidemia, who has been seen and evaluated for cardiac restratification for vascular repair of penetrating aortic ulcer and now new onset atrial fibrillation.  Clinical Impression  Pt pleasant and eager to work with PT but was limited due to pain, significant weakness, and fatigue with moderate activity.  He showed good effort with attempting to get to side, sitting, standing but needed considerable pain with all these.  Pt did manage, with heavy assist to position, use UEs appropriately, etc, to maintain sitting EOB with only CGA for a few minutes but was consistently leaning backward and c/o increased low back pain with increased time in sitting.  Elevated bed and heavy assist to attain standing in walker, but pt could not get knees to extend/attain fully upright - fatigue and back pain necessitated sitting after <1 minute of standing.  Pt too fatigued and in pain to attempt again, heavy assist to get back to supine.  Performed b/l LE exercises and educated pt/wife on QS, heel slides and hip ab/ad per tolerance. Pt will benefit from continued PT to address functional limitations and promote return to PLOF.    Recommendations for follow up therapy are one component of a multi-disciplinary discharge planning process, led by the attending physician.  Recommendations may be updated based on patient status, additional functional criteria and insurance authorization.  Follow Up Recommendations Can patient physically be transported by private vehicle: No     Assistance Recommended at Discharge Frequent or constant Supervision/Assistance  Patient can return home with the following  Two people to help  with walking and/or transfers;Two people to help with bathing/dressing/bathroom;Assistance with cooking/housework;Assist for transportation;Help with stairs or ramp for entrance    Equipment Recommendations  (TBD at rehab)  Recommendations for Other Services       Functional Status Assessment Patient has had a recent decline in their functional status and demonstrates the ability to make significant improvements in function in a reasonable and predictable amount of time.     Precautions / Restrictions Precautions Precautions: Fall Restrictions Weight Bearing Restrictions: No      Mobility  Bed Mobility Overal bed mobility: Needs Assistance Bed Mobility: Supine to Sit, Sit to Supine     Supine to sit: Mod assist Sit to supine: Max assist   General bed mobility comments: pt showed good effort trying to roll to L and use rail to rise but was ultimately very limited with how much he could assist with getting to sitting, struggled to maintain sitting EOB with retro lean heavy assist back to supine    Transfers Overall transfer level: Needs assistance Equipment used: Rolling walker (2 wheels) Transfers: Sit to/from Stand Sit to Stand: Max assist, From elevated surface           General transfer comment: Pt needed elevated bed and heavy assist to attempt standing.  Pt struggled to rise and ultimately in 30-45 seconds on his feet he never fully extended knee and needed constant heavy assist to keep from dropping back to the bed.  Even with direct PT assist to extend knee he could not attain upright.    Ambulation/Gait               General Gait Details: unable/unsafe  Stairs  Wheelchair Mobility    Modified Rankin (Stroke Patients Only)       Balance Overall balance assessment: Needs assistance Sitting-balance support: Feet supported Sitting balance-Leahy Scale: Poor   Postural control: Posterior lean Standing balance support: Bilateral upper  extremity supported, Reliant on assistive device for balance Standing balance-Leahy Scale: Zero Standing balance comment: heavy assist from PT t/o the <1 minute standing effort                             Pertinent Vitals/Pain Pain Assessment Pain Assessment: 0-10 Pain Score: 2  Pain Location: back, reports increase up to 10/10 during standing    Home Living Family/patient expects to be discharged to:: Unsure Living Arrangements: Spouse/significant other Available Help at Discharge: Family;Available 24 hours/day Type of Home: House Home Access: Stairs to enter Entrance Stairs-Rails: Right Entrance Stairs-Number of Steps: 2   Home Layout: Two level;Able to live on main level with bedroom/bathroom Home Equipment: None      Prior Function Prior Level of Function : Independent/Modified Independent;Working/employed;Driving             Mobility Comments: Pt able to be active, driving, working, mowing yard, etc ADLs Comments: Pt is very independent. 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     Hand Dominance        Extremity/Trunk Assessment   Upper Extremity Assessment Upper Extremity Assessment: Generalized weakness    Lower Extremity Assessment Lower Extremity Assessment: Generalized weakness (pt needing AAROM for hip movements (mostly notedly hip ABd b/l), able to active TKE with weak QS)       Communication   Communication: HOH  Cognition Arousal/Alertness: Awake/alert Behavior During Therapy: WFL for tasks assessed/performed Overall Cognitive Status: Within Functional Limits for tasks assessed                                          General Comments General comments (skin integrity, edema, etc.): Pt with good effort but is extremely functionally limited.  Pt's HR in the 90s at rest, up to 120s by end of activity    Exercises General Exercises - Lower Extremity Ankle Circles/Pumps: AROM, 10  reps Quad Sets: AROM, 5 reps Heel Slides: AAROM, AROM, 5 reps (AAROM hip flexion with lightly resisted leg ext) Hip ABduction/ADduction: AAROM, 5 reps   Assessment/Plan    PT Assessment Patient needs continued PT services  PT Problem List Decreased strength;Decreased range of motion;Decreased activity tolerance;Decreased balance;Decreased mobility;Decreased knowledge of use of DME;Decreased safety awareness;Pain;Cardiopulmonary status limiting activity       PT Treatment Interventions Gait training;DME instruction;Functional mobility training;Therapeutic activities;Therapeutic exercise;Balance training;Neuromuscular re-education;Patient/family education    PT Goals (Current goals can be found in the Care Plan section)  Acute Rehab PT Goals Patient Stated Goal: get stronger PT Goal Formulation: With patient/family Time For Goal Achievement: 08/09/22 Potential to Achieve Goals: Fair    Frequency Min 4X/week     Co-evaluation               AM-PAC PT "6 Clicks" Mobility  Outcome Measure Help needed turning from your back to your side while in a flat bed without using bedrails?: A Lot Help needed moving from lying on your back to sitting on the side of a flat bed without using bedrails?: A Lot Help needed moving  to and from a bed to a chair (including a wheelchair)?: Total Help needed standing up from a chair using your arms (e.g., wheelchair or bedside chair)?: Total Help needed to walk in hospital room?: Total Help needed climbing 3-5 steps with a railing? : Total 6 Click Score: 8    End of Session Equipment Utilized During Treatment: Gait belt;Oxygen Activity Tolerance: Patient limited by pain;Patient limited by fatigue Patient left: with bed alarm set;with call bell/phone within reach Nurse Communication: Mobility status PT Visit Diagnosis: Muscle weakness (generalized) (M62.81);Difficulty in walking, not elsewhere classified (R26.2)    Time: 1130-1157 PT Time  Calculation (min) (ACUTE ONLY): 27 min   Charges:   PT Evaluation $PT Eval Low Complexity: 1 Low PT Treatments $Therapeutic Exercise: 8-22 mins        Malachi Pro, DPT 07/27/2022, 1:12 PM

## 2022-07-27 NOTE — Progress Notes (Signed)
Patient transferred to room 237 on hospital bed, with cardiac monitoring, all belongings, and in stable condition.

## 2022-07-27 NOTE — Progress Notes (Signed)
Attempted to call report on patient.

## 2022-07-27 NOTE — Progress Notes (Signed)
Report given to receiving nurse, Diannia Ruder.

## 2022-07-27 NOTE — Progress Notes (Signed)
Progress Note   Patient: Brett Riley EAV:[id number] DOB: 1935/08/08 DOA: 07/20/2022     7 DOS: the patient was seen and examined on 07/27/2022    Subjective:  Patient seen and examined this morning in the presence of the wife  He looks much more pleasant today He tells me his pain is better He has started working with physical therapist Denies nausea vomiting chest pain or cough       Brief Narrative / Hospital Course:  Brett Riley is a 87 y.o. Caucasian male with medical history significant for coronary artery disease, hypertension, dyslipidemia and urolithiasis, who presented to the ER with acute onset of bilateral low back pain that started 07/20/2022 in the morning after he got out of bed.  He was barely able to stand per his wife.  He had nausea around  noon. Came to ED.  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 No aneurysm or dissection. 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 Dr Evie Lacks w/ vascular surgery team was consulted by EDP - Since no other explanation for back pain, aortic ulceration is suspected to be the culprit. Dr. Evie Lacks recs hospitalization, pt admitted to hospitalist service.  05/19: Vasc surg planning endovascular intervention if/when cleared per cardiology, recs for cardiology eval and Echo.  05/20: echocardiogram pending - stable LV systolic function and no new wall motion abnormality --> patient will be able to proceed with endovascular repair without further cardiac intervention  05/21: converted into atrial fibrillation with RVR and required diltiazem and metoprolol.  Rate was controlled after this and did not require diltiazem drip. Starting heparin gtt. Await vascular procedure planned for tomorrow    Consultants:  Vascular surgery Cardiology    Procedures: 07/24/2022-stent placement by vascular surgeon   ASSESSMENT &  PLAN:   Principal Problem:   Penetrating atherosclerotic ulcer of aorta (HCC) Active Problems:   Uncontrolled hypertension   Coronary artery disease   Hypoxemia   Preop cardiovascular exam   Dyslipidemia   PVD (peripheral vascular disease) (HCC)   Atrial fibrillation (HCC)     Penetrating atherosclerotic ulcer of aorta (HCC) Pain management aspirin and Plavix. high-dose statin. Strict blood pressure control. Vascular surgery status post stent placement done on 07/24/2022   Atrial fibrillation new onset 05/20 22:58 heparin infusion for CHA2DS2-VASc score of at least 4 Toprol-XL 50 mg twice daily (PTA Toprol-XL 100 mg daily, discontinued due to drop in blood pressure) Potassium 3.5 recommend keeping potassium greater than 4 less than 5 TSH 2.922 Continue on telemetry monitoring Will need to be transition to oral anticoagulant prior to discharge   SIRS d/t pain/aortic ulcer No infectious source or symptoms WBC to WNL, HR was improving, tachcyardia now d/t afib  Monitor    Hyponatremia likely secondary to SIADH Patient was on IV fluid with no improvement urine osmolarity 520 urine sodium 59 serum osmolarity 277 Normal IV fluid Fluid restriction Monitor BMP   Essential hypertension Metoprolol for rate control and BP Holding usual home meds d/t soft BP as needed IV labetalol and hydralazine for adequate BP control.   Dyslipidemia increase statin therapy dose.   Coronary artery disease continue aspirin Plavix as well as statin therapy, ACE inhibitor with lisinopril and beta-blocker with Toprol-XL   Hypoxemia early admission - pt is not taking good breaths d/t pain  (+)D-Dimer likely d/t inflammation has not had any  dyspnea or cough or wheezing, no chest pain, no pleuritic chest pain Defer PE w/u, Wells 0  Avoid excessive use contrast, pt also does not want to move for any tests d/t back pain        DVT prophylaxis: Heparin     Code Status: FULL CODE       Current Admission Status: inpatient     TOC needs / Dispo plan: TBD   Barriers to discharge / significant pending items: pending vascular surgery clearance   Family Communication: wife      Physical Exam Constitutional:      General: He is not in acute distress. Cardiovascular:     Rate and Rhythm: Normal rate. Rhythm irregular.     Heart sounds: Normal heart sounds.  Pulmonary:     Effort: Pulmonary effort is normal.     Breath sounds: Normal breath sounds.  Abdominal:     General: Abdomen is flat.     Palpations: Abdomen is soft.  Musculoskeletal:     Right lower leg: No edema.     Left lower leg: No edema.  Neurological:     General: No focal deficit present.     Mental Status: He is alert.  Psychiatric:        Mood and Affect: Mood normal.        Behavior: Behavior normal.        Data Reviewed: I have personally reviewed patient's laboratory results showing sodium 128 potassium 4.1 showing sodium 128 potassium 4.1 creatinine 0.92   Time spent: 44 minutes     Vitals:   07/27/22 0700 07/27/22 0800 07/27/22 0844 07/27/22 1350  BP: 118/84 (!) 125/90 (!) 158/95 (!) 149/77  Pulse: (!) 104 (!) 112 (!) 113 94  Resp: 15 19 18 18   Temp:  98 F (36.7 C) 98.2 F (36.8 C) 97.6 F (36.4 C)  TempSrc:  Oral    SpO2: 93% 90% 94% 95%  Weight:      Height:         Author: Loyce Dys, MD 07/27/2022 3:53 PM  For on call review [email or web address].

## 2022-07-27 NOTE — Progress Notes (Signed)
NSG 0700-1900: Called into pt's room by family.  They are concerned about him being lethargic, possibly related to pain medication.  Pt rcd scheduled MS Contin with AM meds.  Pt drowsy, awakes with voice at this time.  Message sent to Dr. Meriam Sprague who states he will review pt's MAR and adjust pain meds.

## 2022-07-27 NOTE — Progress Notes (Signed)
Inpatient Rehab Admissions Coordinator:  ° °Per therapy recommendation,  patient was screened for CIR candidacy by Uyen Eichholz, MS, CCC-SLP. At this time, Pt. Appears to be a a potential candidate for CIR. I will place   order for rehab consult per protocol for full assessment. Please contact me any with questions. ° °Rithwik Schmieg, MS, CCC-SLP °Rehab Admissions Coordinator  °[phone] (celll) °[phone] (office) ° °

## 2022-07-27 NOTE — Progress Notes (Addendum)
Cardiology Progress Note   Patient Name: Brett Riley Date of Encounter: 07/27/2022  Primary Cardiologist: Lorine Bears, MD  Subjective   Transferred from ICU this AM.  Back pain present but overall better.  Remains in afib, into the 120's at times.  Asymptomatic.  No c/p or dyspnea.  Inpatient Medications    Scheduled Meds:  apixaban  5 mg Oral BID   aspirin EC  81 mg Oral Daily   Chlorhexidine Gluconate Cloth  6 each Topical Daily   cyanocobalamin  1,000 mcg Oral Daily   diltiazem  360 mg Oral Daily   metoprolol succinate  100 mg Oral BID   morphine  30 mg Oral Q12H   multivitamin with minerals  1 tablet Oral Daily   potassium chloride  20 mEq Oral Daily   rosuvastatin  40 mg Oral Daily   Continuous Infusions:  PRN Meds: acetaminophen **OR** acetaminophen, fentaNYL (SUBLIMAZE) injection, hydrALAZINE, magnesium hydroxide, metoprolol tartrate, ondansetron (ZOFRAN) IV, ondansetron (ZOFRAN) IV, mouth rinse, traZODone   Vital Signs    Vitals:   07/27/22 0300 07/27/22 0400 07/27/22 0600 07/27/22 0700  BP: (!) 124/54 113/77 119/75 118/84  Pulse: 90 85 (!) 104 (!) 104  Resp: 13 13 13 15   Temp:  98 F (36.7 C)    TempSrc:      SpO2: 93% 93% 93% 93%  Weight:      Height:        Intake/Output Summary (Last 24 hours) at 07/27/2022 0808 Last data filed at 07/27/2022 0200 Gross per 24 hour  Intake 119.82 ml  Output 1650 ml  Net -1530.18 ml   Filed Weights   07/20/22 2107 07/23/22 0404 07/24/22 1225  Weight: 88 kg 89.9 kg 89.9 kg    Physical Exam   GEN: Well nourished, well developed, in no acute distress.  HEENT: Grossly normal.  Neck: Supple, no JVD, carotid bruits, or masses. Cardiac: IR, IR, tachy, no murmurs, rubs, or gallops. No clubbing, cyanosis, edema.  Radials 2+, DP/PT 2+ and equal bilaterally.  Respiratory:  Respirations regular and unlabored, clear to auscultation bilaterally. GI: Soft, nontender, nondistended, BS + x 4. MS: no deformity or  atrophy. Skin: warm and dry, no rash. Neuro:  Strength and sensation are intact. Psych: AAOx3.  Normal affect.  Labs    Chemistry Recent Labs  Lab 07/20/22 1552 07/21/22 0448 07/25/22 0205 07/26/22 0618 07/27/22 0626  NA 136   < > 128* 130* 128*  K 3.7   < > 4.0 3.6 4.1  CL 104   < > 97* 100 99  CO2 24   < > 22 24 23   GLUCOSE 121*   < > 138* 109* 111*  BUN 25*   < > 32* 35* 29*  CREATININE 0.99   < > 0.99 0.98 0.92  CALCIUM 9.2   < > 7.8* 8.0* 7.8*  PROT 7.7  --   --   --   --   ALBUMIN 4.6  --   --   --   --   AST 38  --   --   --   --   ALT 25  --   --   --   --   ALKPHOS 72  --   --   --   --   BILITOT 0.9  --   --   --   --   GFRNONAA >60   < > >60 >60 >60  ANIONGAP 8   < > 9  6 6   < > = values in this interval not displayed.     Hematology Recent Labs  Lab 07/25/22 0205 07/26/22 0618 07/27/22 0626  WBC 8.9 10.3 10.1  RBC 3.83* 3.97* 3.82*  HGB 12.3* 12.7* 12.2*  HCT 36.1* 37.2* 35.8*  MCV 94.3 93.7 93.7  MCH 32.1 32.0 31.9  MCHC 34.1 34.1 34.1  RDW 13.2 13.2 13.4  PLT 113* 118* 132*   BNP    Component Value Date/Time   BNP 329.1 (H) 07/21/2022 0447   DDimer  Recent Labs  Lab 07/20/22 2233  DDIMER 2.03*     Lipids  Lab Results  Component Value Date   CHOL 129 06/06/2022   HDL 34.80 (L) 06/06/2022   LDLCALC 62 06/06/2022   TRIG 161.0 (H) 06/06/2022   CHOLHDL 4 06/06/2022   Radiology    PERIPHERAL VASCULAR CATHETERIZATION  Result Date: 07/24/2022 See surgical note for result.   Telemetry    Afib, 90's to 120's - Personally Reviewed  Cardiac Studies   2D Echocardiogram 5.20.2024    1. Left ventricular ejection fraction, by estimation, is 55 to 60%. The  left ventricle has normal function. The left ventricle has no regional  wall motion abnormalities. Left ventricular diastolic parameters are  consistent with Grade I diastolic  dysfunction (impaired relaxation).   2. Right ventricular systolic function is normal. The right  ventricular  size is normal. There is normal pulmonary artery systolic pressure.   3. The mitral valve is normal in structure. Mild to moderate mitral valve  regurgitation. No evidence of mitral stenosis. Moderate mitral annular  calcification.   4. The aortic valve is normal in structure. Aortic valve regurgitation is  trivial. Aortic valve sclerosis is present, with no evidence of aortic  valve stenosis.   5. The inferior vena cava is normal in size with greater than 50%  respiratory variability, suggesting right atrial pressure of 3 mmHg.   Patient Profile     87 y.o. male with a past medical history of coronary artery disease, carotid artery disease status post left carotid endarterectomy, PAD, hypertension, hyperlipidemia, who has been seen and evaluated for cardiac restratification for vascular repair of penetrating aortic ulcer and now new onset atrial fibrillation.   Assessment & Plan    1.  Penetrating ulcer of the abdominal aorta: Status post EVAR on May 22.  Currently on asa/eliquis.  Vasc surgery plans to use plavix instead of ASA @ d/c.   2.  Persistent atrial fibrillation: Patient developed atrial fibrillation early on May 21.  Based on records, it appears that he was likely in atrial fibrillation for nearly 12 hours prior to initiation of heparin and then required a 6-hour heparin hold for his procedure on May 22.  He has remained in atrial fibrillation despite titration of calcium channel blocker and beta-blocker therapy. He received digoxin bolus on 5/24 w/ brief improvement in rates, though he is back in the 1-teens to 120's this AM.  Disucssed w/ Dr. Graciela Husbands this AM.  Given difficulty w/ controlling rates and witnessed afib w/ minimal gaps in anticoagulation, will load w/ PO amio.  In so doing, will reduce metoprolol to 50 mg bid.  If he either doesn't convert or rates cannot be adequately controlled, will need to consider DCCV on Tuesday.   Cont eliquis.   3.  Essential HTN:  BPs remain stable.   4.  Hyperlipidemia: Continue statin therapy.  LDL of 62.  Signed, Nicolasa Ducking, NP  07/27/2022, 8:08  AM  \  As above Persistent atrial fibrillation with mod rapid rates, will continue dilt and BB but reduce the dose as we initiate amio The natural hx of "secondary" afib is unfortunately not much different from otherwise, so long term anticoagulation is appropriate in this 87 yo mman with HTN  Still with back pian, but not thought 2/2 endoleak   will defer  BP at goal of about 120 mm     For questions orupdates, please contact   Please consult [email or web address] for contact info under Cardiology/STEMI. d

## 2022-07-27 NOTE — Progress Notes (Signed)
    Subjective  - POD # 3, status post endovascular repair of penetrating aortic ulcer  Rates his pain as a 2 out of 10.  This is down from 8 out of 10 on arrival. Remains in bed and has not been able to stand up   Physical Exam:  Both groin sites are without hematoma Extremities are warm and well-perfused Abdomen is soft and nontender   Assessment/Plan:  POD #3  Aortic penetrating ulcer: Successfully excluded with endovascular stent graft which was associated with a significant decrease in his pain, although he is still having mild back pain.  I hope this will resolve with time.  There were no other etiologies for his back pain on his CT scan.  A-fib: Heart rate is a little better controlled.  He is on anticoagulation.  Cardioversion is a possibility next week.  I wonder if this has any impact on his overall fatigue.  Patient remains on nasal cannula oxygen.  I would encourage incentive spirometry to try to get him off of oxygen.  He is not on home oxygen.  PT: The patient needs to be more active.  He cannot stand up on his own.  He was working 5 days a week prior to coming into the hospital so this has been a significant decline.  I have encouraged him to do activities in bed and to at least try and get to a chair.  Brett Riley 07/27/2022 2:37 PM --  Vitals:   07/27/22 0844 07/27/22 1350  BP: (!) 158/95 (!) 149/77  Pulse: (!) 113 94  Resp: 18 18  Temp: 98.2 F (36.8 C) 97.6 F (36.4 C)  SpO2: 94% 95%    Intake/Output Summary (Last 24 hours) at 07/27/2022 1437 Last data filed at 07/27/2022 1421 Gross per 24 hour  Intake 525.45 ml  Output 1250 ml  Net -724.55 ml     Laboratory CBC    Component Value Date/Time   WBC 10.1 07/27/2022 0626   HGB 12.2 (L) 07/27/2022 0626   HCT 35.8 (L) 07/27/2022 0626   PLT 132 (L) 07/27/2022 0626    BMET    Component Value Date/Time   NA 128 (L) 07/27/2022 0626   NA 141 03/24/2019 1046   K 4.1 07/27/2022 0626   CL 99  07/27/2022 0626   CO2 23 07/27/2022 0626   GLUCOSE 111 (H) 07/27/2022 0626   BUN 29 (H) 07/27/2022 0626   BUN 18 03/24/2019 1046   CREATININE 0.92 07/27/2022 0626   CALCIUM 7.8 (L) 07/27/2022 0626   GFRNONAA >60 07/27/2022 0626   GFRAA >60 09/19/2019 0935    COAG Lab Results  Component Value Date   INR 1.3 (H) 07/23/2022   INR 1.08 02/13/2016   INR 0.97 01/18/2016   No results found for: "PTT"  Antibiotics Anti-infectives (From admission, onward)    Start     Dose/Rate Route Frequency Ordered Stop   07/24/22 0600  ceFAZolin (ANCEF) IVPB 2g/100 mL premix        2 g 200 mL/hr over 30 Minutes Intravenous On call to O.R. 07/23/22 2110 07/24/22 1318        V. Charlena Cross, M.D., [facility] [place] Office: [phone] Pager:  [phone]

## 2022-07-28 DIAGNOSIS — I719 Aortic aneurysm of unspecified site, without rupture: Secondary | ICD-10-CM | POA: Diagnosis not present

## 2022-07-28 LAB — BASIC METABOLIC PANEL WITH GFR
Anion gap: 6 (ref 5–15)
BUN: 32 mg/dL — ABNORMAL HIGH (ref 8–23)
CO2: 23 mmol/L (ref 22–32)
Calcium: 8 mg/dL — ABNORMAL LOW (ref 8.9–10.3)
Chloride: 98 mmol/L (ref 98–111)
Creatinine, Ser: 0.94 mg/dL (ref 0.61–1.24)
GFR, Estimated: >60 mL/min
Glucose, Bld: 116 mg/dL — ABNORMAL HIGH (ref 70–99)
Potassium: 4.1 mmol/L (ref 3.5–5.1)
Sodium: 127 mmol/L — ABNORMAL LOW (ref 135–145)

## 2022-07-28 LAB — MAGNESIUM: Magnesium: 2.4 mg/dL (ref 1.7–2.4)

## 2022-07-28 MED ORDER — OXYCODONE-ACETAMINOPHEN 7.5-325 MG PO TABS
1.0000 | ORAL_TABLET | ORAL | Status: DC | PRN
  Administered 2022-07-28: 1 via ORAL
  Filled 2022-07-28: qty 1

## 2022-07-28 MED ORDER — OXYCODONE-ACETAMINOPHEN 5-325 MG PO TABS
1.0000 | ORAL_TABLET | Freq: Four times a day (QID) | ORAL | Status: DC | PRN
  Administered 2022-07-29 (×3): 1 via ORAL
  Filled 2022-07-28 (×3): qty 1

## 2022-07-28 NOTE — TOC Progression Note (Signed)
Transition of Care [facility]) - Progression Note    Patient Details  Name: Brett Riley MRN: [id number] Date of Birth: Apr 03, 1935  Transition of Care [facility]) CM/SW Contact  Kemper Durie, RN Phone Number: 07/28/2022, 3:40 PM  Clinical Narrative:     Notified by PT that wife has inquired about AIR at [facility]  Call placed to facility, notified that they do not have any bed currently but TOC requested to call back tomorrow or Tuesday to speak directly to intake coordinator [phone]).        Expected Discharge Plan and Services                                               Social Determinants of Health (SDOH) Interventions SDOH Screenings   Food Insecurity: No Food Insecurity (07/20/2022)  Housing: Low Risk  (07/20/2022)  Transportation Needs: No Transportation Needs (07/20/2022)  Utilities: Not At Risk (07/20/2022)  Depression (PHQ2-9): Low Risk  (06/06/2022)  Financial Resource Strain: Low Risk  (08/29/2020)  Physical Activity: Insufficiently Active (08/29/2020)  Social Connections: Unknown (08/29/2020)  Stress: No Stress Concern Present (08/29/2020)  Tobacco Use: Low Risk  (07/24/2022)    Readmission Risk Interventions     No data to display

## 2022-07-28 NOTE — Progress Notes (Signed)
Progress Note   Patient: Brett Riley [name] DOB: Oct 03, 1935 DOA: 07/20/2022     8 DOS: the patient was seen and examined on 07/28/2022    Subjective:  Patient seen and examined this morning in the presence of the wife  According to nursing staff patient became lethargic later yesterday MS Contin as well as fentanyl discontinued He has started working with physical therapist Denies nausea vomiting chest pain or cough       Brief Narrative / Hospital Course:  Brett Riley is a 87 y.o. Caucasian male with medical history significant for coronary artery disease, hypertension, dyslipidemia and urolithiasis, who presented to the ER with acute onset of bilateral low back pain that started 07/20/2022 in the morning after he got out of bed.  He was barely able to stand per his wife.  He had nausea around  noon. Came to ED.  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 No aneurysm or dissection. 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 Dr Brett Riley w/ vascular surgery team was consulted by EDP - Since no other explanation for back pain, aortic ulceration is suspected to be the culprit. Dr. Evie Riley recs hospitalization, pt admitted to hospitalist service.  05/19: Vasc surg planning endovascular intervention if/when cleared per cardiology, recs for cardiology eval and Echo.  05/20: echocardiogram pending - stable LV systolic function and no new wall motion abnormality --> patient will be able to proceed with endovascular repair without further cardiac intervention  05/21: converted into atrial fibrillation with RVR and required diltiazem and metoprolol.  Rate was controlled after this and did not require diltiazem drip. Starting heparin gtt. Await vascular procedure planned for tomorrow    Consultants:  Vascular surgery Cardiology    Procedures: 07/24/2022-stent  placement by vascular surgeon   ASSESSMENT & PLAN:   Principal Problem:   Penetrating atherosclerotic ulcer of aorta (HCC) Active Problems:   Uncontrolled hypertension   Coronary artery disease   Hypoxemia   Preop cardiovascular exam   Dyslipidemia   PVD (peripheral vascular disease) (HCC)   Atrial fibrillation (HCC)     Penetrating atherosclerotic ulcer of aorta (HCC) with bilateral leg and back pain Pain management Currently on aspirin and Eliquis Plavix discontinued according to recommendation by vascular surgeon high-dose statin. Strict blood pressure control. Vascular surgery status post stent placement done on 07/24/2022 We will continue to down titrate pain medication as needed   Atrial fibrillation new onset 05/20 22:58 heparin infusion for CHA2DS2-VASc score of at least 4 Continue metoprolol with dose adjustment by cardiologist Monitor electrolytes and replete as needed TSH 2.922 Continue on telemetry monitoring Patient currently transitioned from heparin drip to Eliquis by cardiologist   SIRS d/t pain/aortic ulcer No infectious source or symptoms WBC to WNL, HR was improving, tachcyardia now d/t afib  Monitor    Hyponatremia likely secondary to SIADH Patient was on IV fluid with no improvement urine osmolarity 520 urine sodium 59 serum osmolarity 277  IV fluid discontinued Fluid restriction I have counseled the patient and the wife to limit fluid intake to 1.5 L/day Continue sodium chloride tablets Monitor BMP   Essential hypertension Metoprolol for rate control and BP Holding usual home meds d/t soft BP as needed IV labetalol and hydralazine for adequate BP control.   Dyslipidemia increase statin therapy dose.   Coronary artery disease continue aspirin Plavix as well as  statin therapy, ACE inhibitor with lisinopril and beta-blocker with Toprol-XL   Hypoxemia early admission - pt is not taking good breaths d/t pain  (+)D-Dimer likely d/t  inflammation has not had any dyspnea or cough or wheezing, no chest pain, no pleuritic chest pain Defer PE w/u, Wells 0  Avoid excessive use contrast, pt also does not want to move for any tests d/t back pain        DVT prophylaxis: Eliquis     Code Status: FULL CODE      Current Admission Status: inpatient     TOC needs / Dispo plan: TBD   Barriers to discharge / significant pending items: Pending placement  Family Communication: wife      Physical Exam Constitutional:      General: He is not in acute distress. Cardiovascular:     Rate and Rhythm: Normal rate. Rhythm irregular.     Heart sounds: Normal heart sounds.  Pulmonary:     Effort: Pulmonary effort is normal.     Breath sounds: Normal breath sounds.  Abdominal:     General: Abdomen is flat.     Palpations: Abdomen is soft.  Musculoskeletal:     Right lower leg: No edema.     Left lower leg: No edema.  Neurological:     General: No focal deficit present.     Mental Status: He is alert.  Psychiatric:        Mood and Affect: Mood normal.        Behavior: Behavior normal.        Data Reviewed: I have personally reviewed patient's laboratory results with sodium 127 potassium 4.1 creatinine 0.9   time spent: 45 minutes        Vitals:   07/28/22 1510 07/28/22 1515 07/28/22 1622 07/28/22 1637  BP: 119/63     Pulse: 97     Resp: 18     Temp: 98.6 F (37 C)     TempSrc:      SpO2: (!) 81% 95% 95% 92%  Weight:      Height:       Author: Loyce Dys, MD 07/28/2022 5:24 PM  For on call review [email or web address].

## 2022-07-28 NOTE — Progress Notes (Signed)
Physical Therapy Treatment Patient Details Name: Brett Riley MRN: [id number] DOB: 16-Nov-1935 Today's Date: 07/28/2022   History of Present Illness 87 y.o. male with a past medical history of coronary artery disease, carotid artery disease status post left carotid endarterectomy, PAD, hypertension, hyperlipidemia, who has been seen and evaluated for cardiac restratification for vascular repair of penetrating aortic ulcer and new onset atrial fibrillation.    PT Comments    Pt again eager to work with PT despite pain, fatigue and general weakness.  He was able to tolerate increased exercises/reps, increased time in sitting EOB, increased time in standing and managed to do some weight shifting/side stepping along EOB this date.  He continues to need considerable physical assist with all transitions but did maintain sitting EOB for 10-20 seconds at a time w/o phyiscal assist today as well as show ability to do some active TKE efforts in standing though still generally maintaining a slightly knee bent position much of the time.  Pt remains very motivated to get back to his baseline independent and active functional level.  Pt's wife present and supportive t/o the session.  Pt will benefit from continued PT per POC to address functional limitations.  Discharge recommendations remain appropriate, pt/wife in agreement.     Recommendations for follow up therapy are one component of a multi-disciplinary discharge planning process, led by the attending physician.  Recommendations may be updated based on patient status, additional functional criteria and insurance authorization.  Follow Up Recommendations  Can patient physically be transported by private vehicle: No    Assistance Recommended at Discharge Frequent or constant Supervision/Assistance  Patient can return home with the following Two people to help with walking and/or transfers;Two people to help with bathing/dressing/bathroom;Assistance with  cooking/housework;Assist for transportation;Help with stairs or ramp for entrance   Equipment Recommendations   (TBD at next venue of care)    Recommendations for Other Services       Precautions / Restrictions Precautions Precautions: Fall Restrictions Weight Bearing Restrictions: No     Mobility  Bed Mobility Overal bed mobility: Needs Assistance Bed Mobility: Supine to Sit, Sit to Supine     Supine to sit: Mod assist Sit to supine: Max assist   General bed mobility comments: Pt was better able to roll toward L, shift hips, use UEs on rail and assist during transition to sitting today, still needing modA to actually lift trunk attain upright.  Similar to yesterday pt leaning to the R and back, needing constant min/modA to maintain sitting but with heavy cuing, enocuragment and positoining adjustment assist did manage to maintain sitting with only supervision/CGA    Transfers Overall transfer level: Needs assistance Equipment used: Rolling walker (2 wheels) Transfers: Sit to/from Stand Sit to Stand: Max assist, From elevated surface           General transfer comment: Pt was able to tolerate ~2.5 minutes of standing/EOB activity today  He continues to struggle with getting upright/full knee extension but with heavy cuing and assist did manage to actively straighten both knees and back/posture today, consistent mod/maxA t/o the standing effort.  Pt showed improving overall tolerance though still very limited as compared to his active and independent baseline.    Ambulation/Gait               General Gait Details: Unsafe to venture away from bed due to knee buckling/fatigue.  However with great effort and considerable assist from PT (to stabilize, unweight and manage lateral walker movement)  he was able to take ~5 b/l small, effortful side shuffle steps to the L, even showed the ability to actively engage quad to accept weight effectively after steppage.  Pt's HR to  120s with the effort.   Stairs             Wheelchair Mobility    Modified Rankin (Stroke Patients Only)       Balance Overall balance assessment: Needs assistance Sitting-balance support: Feet supported Sitting balance-Leahy Scale: Poor Sitting balance - Comments: Pt able to maintain sitting EOB ~10 minutes with inconsistent need for supervision to modA t/o the effort.  Pt generally leaning to the R (and secondarily backward) needing consistent cuing for UE use/positioning/hand over hand support but better able to maintain and sustain more prolonged bouts of CGA/no direct physical assist sitting bouts between LOBs and needing increased help.  Pt clearly fatigued with the effort but motivated to do as much as he could.  O2 remained in low 90s on 2L Postural control: Posterior lean Standing balance support: Bilateral upper extremity supported, Reliant on assistive device for balance Standing balance-Leahy Scale: Poor Standing balance comment: Yesterday pt needed maxA the entire time in standing, he tolerated ~3x as much time in standing and was able to occasionally stand with only minA, however still needing mod/max at times.                            Cognition Arousal/Alertness: Awake/alert Behavior During Therapy: WFL for tasks assessed/performed Overall Cognitive Status: Within Functional Limits for tasks assessed                                          Exercises General Exercises - Lower Extremity Ankle Circles/Pumps: AROM, 10 reps Quad Sets: AROM, 10 reps Short Arc Quad: AROM, 10 reps (AROM for TKE (R>L)) Heel Slides: AAROM, 10 reps, Strengthening, AROM (AAROM on R, AROM on L, lightly resisted leg ext b/l) Hip ABduction/ADduction: Strengthening, AROM, 10 reps (light resistance per tolerance on L, AROM only on R)    General Comments General comments (skin integrity, edema, etc.): Coordinated pain meds with nursing prior to PT session and  he did seem less pain effected today, however he was also more sleepy...  he continues to show great motivation but it is clear that he is frustrated with how weak he is (especially considering how active he normally is).      Pertinent Vitals/Pain Pain Assessment Pain Assessment: 0-10 Pain Score: 6  Pain Location: back pain primarily, reports knee joint pain (R>L) with in-bed LE exercises    Home Living                          Prior Function            PT Goals (current goals can now be found in the care plan section) Progress towards PT goals: Progressing toward goals    Frequency    Min 4X/week      PT Plan Current plan remains appropriate    Co-evaluation              AM-PAC PT "6 Clicks" Mobility   Outcome Measure  Help needed turning from your back to your side while in a flat bed without using bedrails?: A Lot Help needed moving from lying  on your back to sitting on the side of a flat bed without using bedrails?: A Lot Help needed moving to and from a bed to a chair (including a wheelchair)?: Total Help needed standing up from a chair using your arms (e.g., wheelchair or bedside chair)?: Total Help needed to walk in hospital room?: Total Help needed climbing 3-5 steps with a railing? : Total 6 Click Score: 8    End of Session Equipment Utilized During Treatment: Gait belt;Oxygen Activity Tolerance: Patient limited by pain;Patient limited by fatigue Patient left: with bed alarm set;with call bell/phone within reach;with family/visitor present Nurse Communication: Mobility status PT Visit Diagnosis: Muscle weakness (generalized) (M62.81);Difficulty in walking, not elsewhere classified (R26.2)     Time: 2130-8657 PT Time Calculation (min) (ACUTE ONLY): 40 min  Charges:  $Therapeutic Exercise: 23-37 mins $Therapeutic Activity: 8-22 mins                     Malachi Pro, DPT 07/28/2022, 2:17 PM

## 2022-07-28 NOTE — Progress Notes (Addendum)
Cardiology Progress Note   Patient Name: Brett Riley Date of Encounter: 07/28/2022  Primary Cardiologist: Lorine Bears, MD  Patient Profile     87 y.o. male with a past medical history of coronary artery disease, carotid artery disease status post left carotid endarterectomy, PAD, hypertension, hyperlipidemia, who has been seen and evaluated for cardiac restratification for vascular repair of penetrating aortic ulcer and now new onset atrial fibrillation.   Subjective   Back still hurting but feels a little better.  Back hurts worse w/ standing.  HRs improved - 70's to 90's.  Inpatient Medications    Scheduled Meds:  amiodarone  400 mg Oral BID   apixaban  5 mg Oral BID   aspirin EC  81 mg Oral Daily   Chlorhexidine Gluconate Cloth  6 each Topical Daily   cyanocobalamin  1,000 mcg Oral Daily   diltiazem  360 mg Oral Daily   metoprolol succinate  50 mg Oral BID   multivitamin with minerals  1 tablet Oral Daily   potassium chloride  20 mEq Oral Daily   rosuvastatin  40 mg Oral Daily   sodium chloride  1 g Oral BID WC   Continuous Infusions:  PRN Meds: acetaminophen **OR** acetaminophen, hydrALAZINE, magnesium hydroxide, metoprolol tartrate, ondansetron (ZOFRAN) IV, ondansetron (ZOFRAN) IV, mouth rinse, oxyCODONE-acetaminophen, sodium chloride, traZODone   Vital Signs    Vitals:   07/27/22 1959 07/27/22 2341 07/28/22 0319 07/28/22 0801  BP: 127/73 124/71 113/81 131/81  Pulse: 99 74 81 93  Resp: 17 16 16 18   Temp: 98.8 F (37.1 C) 98.2 F (36.8 C) 98.3 F (36.8 C) 98.3 F (36.8 C)  TempSrc: Oral     SpO2: 94% 94% 95% 95%  Weight:      Height:        Intake/Output Summary (Last 24 hours) at 07/28/2022 1142 Last data filed at 07/28/2022 0948 Gross per 24 hour  Intake 700 ml  Output 2125 ml  Net -1425 ml   Filed Weights   07/20/22 2107 07/23/22 0404 07/24/22 1225  Weight: 88 kg 89.9 kg 89.9 kg    Physical Exam   GEN: Well nourished, well developed, in  no acute distress.  HEENT: Grossly normal.  Neck: Supple, no JVD, carotid bruits, or masses. Cardiac: RRR, no murmurs, rubs, or gallops. No clubbing, cyanosis, edema.  Radials 2+, DP/PT 2+ and equal bilaterally.  Respiratory:  Respirations regular and unlabored, clear to auscultation bilaterally. GI: Soft, nontender, nondistended, BS + x 4. MS: no deformity or atrophy. Skin: warm and dry, no rash. Neuro:  Strength and sensation are intact. Psych: AAOx3.  Normal affect.  Labs    Chemistry Recent Labs  Lab 07/26/22 0618 07/27/22 0626 07/28/22 0424  NA 130* 128* 127*  K 3.6 4.1 4.1  CL 100 99 98  CO2 24 23 23   GLUCOSE 109* 111* 116*  BUN 35* 29* 32*  CREATININE 0.98 0.92 0.94  CALCIUM 8.0* 7.8* 8.0*  GFRNONAA >60 >60 >60  ANIONGAP 6 6 6      Hematology Recent Labs  Lab 07/25/22 0205 07/26/22 0618 07/27/22 0626  WBC 8.9 10.3 10.1  RBC 3.83* 3.97* 3.82*  HGB 12.3* 12.7* 12.2*  HCT 36.1* 37.2* 35.8*  MCV 94.3 93.7 93.7  MCH 32.1 32.0 31.9  MCHC 34.1 34.1 34.1  RDW 13.2 13.2 13.4  PLT 113* 118* 132*   BNP    Component Value Date/Time   BNP 329.1 (H) 07/21/2022 0447   Lipids  Lab Results  Component Value Date   CHOL 129 06/06/2022   HDL 34.80 (L) 06/06/2022   LDLCALC 62 06/06/2022   TRIG 161.0 (H) 06/06/2022   CHOLHDL 4 06/06/2022   Radiology    DG Chest Port 1 View  Result Date: 07/27/2022 CLINICAL DATA:  Decreased breath sounds at the left lung base. EXAM: PORTABLE CHEST 1 VIEW COMPARISON:  Radiograph 07/20/2022 FINDINGS: There are increasing heterogeneous opacities in both lung bases, greatest in the retrocardiac region. There may be slight vascular congestion. Stable heart size and mediastinal contours. Aortic atherosclerosis. No large pleural effusion. No pneumothorax. IMPRESSION: Increasing heterogeneous opacities in both lung bases, greatest in the retrocardiac region, may be atelectasis or pneumonia. Electronically Signed   By: Narda Rutherford M.D.    On: 07/27/2022 19:52   PERIPHERAL VASCULAR CATHETERIZATION  Result Date: 07/24/2022 See surgical note for result.   Telemetry    Afib, 70's to 90's - Personally Reviewed  Cardiac Studies    2D Echocardiogram 5.20.2024    1. Left ventricular ejection fraction, by estimation, is 55 to 60%. The  left ventricle has normal function. The left ventricle has no regional  wall motion abnormalities. Left ventricular diastolic parameters are  consistent with Grade I diastolic  dysfunction (impaired relaxation).   2. Right ventricular systolic function is normal. The right ventricular  size is normal. There is normal pulmonary artery systolic pressure.   3. The mitral valve is normal in structure. Mild to moderate mitral valve  regurgitation. No evidence of mitral stenosis. Moderate mitral annular  calcification.   4. The aortic valve is normal in structure. Aortic valve regurgitation is  trivial. Aortic valve sclerosis is present, with no evidence of aortic  valve stenosis.   5. The inferior vena cava is normal in size with greater than 50%  respiratory variability, suggesting right atrial pressure of 3 mmHg.   Assessment & Plan    1.  Penetrating ulcer of the abdominal aorta: Status post EVAR on May 22.  Currently on asa/eliquis.  Vasc surgery plans to use plavix instead of ASA @ d/c.   2.  Persistent atrial fibrillation: Patient developed atrial fibrillation early on May 21.  Efforts @ rate control w/ benign essential tremor and dilt insufficient and we added oral amio on 5/25.  Rates better - 70's to 90's, though still in Afib.  Cont metoprolol.  If rates start climbing w/ activity would look to titrate ? blocker further.  Cont eliquis.  Provided rates stable and asymptomatic, may be able to defer DCCV for 3-4 wks.     3.  Essential HTN: BPs remain stable.   4.  Hyperlipidemia: Continue statin therapy.  LDL of 62.    Signed, Nicolasa Ducking, NP  07/28/2022, 11:42 AM    For  questions or updates, please contact   Please consult [email or web address] for contact info under Cardiology/STEMI.   AFib persistent  continue amiodarone   on Apixaban    Penetrating ulcer AA>>s/p EVAR on ASA   BAck pain  >> per primary services.  My little bit of reading suggests 5% + incidence of endoleak could this be the cause  Weakness upon standing ---wonder whether there is an orthostatic component--hx of HTN and orthostatic LH PTA) - will do best to assess as may be contributing to "weakness"

## 2022-07-28 NOTE — Progress Notes (Signed)
Inpatient Rehab Admissions Coordinator:    I spoke with Pt.'s wife to discuss potential CIR admit. She is interested but states that PT yesterday was not sure he could tolerate the intensity of CIR. I reached out to PT who is going to see PT today to see if tolerance improves. Pt.'s wife does want CIR for Pt. If it is felt that he can tolerate the intensity.  I will re evaluate candidacy after he works with PT today.   Megan Salon, MS, CCC-SLP Rehab Admissions Coordinator  [phone] (celll) [phone] (office)

## 2022-07-28 NOTE — Progress Notes (Signed)
    Subjective  - POD # 4, status post endovascular repair of penetrating aortic ulcer  Stable back pain   Physical Exam:  Bilateral groin incisions are healing appropriately Both feet are warm and well-perfused Abdomen is soft and nontender       Assessment/Plan:  POD # 4  Aortic penetrating ulcer: Successfully excluded with endovascular stent graft which was associated with a significant decrease in his pain, although he is still having mild back pain.  I hope this will resolve with time.  There were no other etiologies for his back pain on his CT scan.   A-fib: Heart rate is a little better controlled.  He is on anticoagulation.  Cardiology is following.  May be able to delay cardioversion     PT: The patient needs to be more active.  He cannot stand up on his own.  He was working 5 days a week prior to coming into the hospital so this has been a significant decline.  I have encouraged him to do activities in bed and to at least try and get to a chair.  Brett Riley 07/28/2022 8:50 PM --  Vitals:   07/28/22 1637 07/28/22 1938  BP:  (!) 162/73  Pulse:  79  Resp:  19  Temp:  99.3 F (37.4 C)  SpO2: 92% 93%    Intake/Output Summary (Last 24 hours) at 07/28/2022 2050 Last data filed at 07/28/2022 1639 Gross per 24 hour  Intake 60 ml  Output 1700 ml  Net -1640 ml     Laboratory CBC    Component Value Date/Time   WBC 10.1 07/27/2022 0626   HGB 12.2 (L) 07/27/2022 0626   HCT 35.8 (L) 07/27/2022 0626   PLT 132 (L) 07/27/2022 0626    BMET    Component Value Date/Time   NA 127 (L) 07/28/2022 0424   NA 141 03/24/2019 1046   K 4.1 07/28/2022 0424   CL 98 07/28/2022 0424   CO2 23 07/28/2022 0424   GLUCOSE 116 (H) 07/28/2022 0424   BUN 32 (H) 07/28/2022 0424   BUN 18 03/24/2019 1046   CREATININE 0.94 07/28/2022 0424   CALCIUM 8.0 (L) 07/28/2022 0424   GFRNONAA >60 07/28/2022 0424   GFRAA >60 09/19/2019 0935    COAG Lab Results  Component Value Date    INR 1.3 (H) 07/23/2022   INR 1.08 02/13/2016   INR 0.97 01/18/2016   No results found for: "PTT"  Antibiotics Anti-infectives (From admission, onward)    Start     Dose/Rate Route Frequency Ordered Stop   07/24/22 0600  ceFAZolin (ANCEF) IVPB 2g/100 mL premix        2 g 200 mL/hr over 30 Minutes Intravenous On call to O.R. 07/23/22 2110 07/24/22 1318        V. Charlena Cross, M.D., [facility] [place] Office: [phone] Pager:  [phone]

## 2022-07-29 ENCOUNTER — Inpatient Hospital Stay: Payer: Medicare HMO

## 2022-07-29 DIAGNOSIS — I719 Aortic aneurysm of unspecified site, without rupture: Secondary | ICD-10-CM | POA: Diagnosis not present

## 2022-07-29 DIAGNOSIS — I4891 Unspecified atrial fibrillation: Secondary | ICD-10-CM | POA: Diagnosis not present

## 2022-07-29 LAB — CBC WITH DIFFERENTIAL/PLATELET
Abs Immature Granulocytes: 0.15 10*3/uL — ABNORMAL HIGH (ref 0.00–0.07)
Basophils Absolute: 0 10*3/uL (ref 0.0–0.1)
Basophils Relative: 0 %
Eosinophils Absolute: 0.1 10*3/uL (ref 0.0–0.5)
Eosinophils Relative: 1 %
HCT: 34.6 % — ABNORMAL LOW (ref 39.0–52.0)
Hemoglobin: 12.1 g/dL — ABNORMAL LOW (ref 13.0–17.0)
Immature Granulocytes: 1 %
Lymphocytes Relative: 11 %
Lymphs Abs: 1.7 10*3/uL (ref 0.7–4.0)
MCH: 32.2 pg (ref 26.0–34.0)
MCHC: 35 g/dL (ref 30.0–36.0)
MCV: 92 fL (ref 80.0–100.0)
Monocytes Absolute: 0.7 10*3/uL (ref 0.1–1.0)
Monocytes Relative: 4 %
Neutro Abs: 12.2 10*3/uL — ABNORMAL HIGH (ref 1.7–7.7)
Neutrophils Relative %: 83 %
Platelets: 201 10*3/uL (ref 150–400)
RBC: 3.76 MIL/uL — ABNORMAL LOW (ref 4.22–5.81)
RDW: 13.3 % (ref 11.5–15.5)
WBC: 14.8 10*3/uL — ABNORMAL HIGH (ref 4.0–10.5)
nRBC: 0 % (ref 0.0–0.2)

## 2022-07-29 LAB — URINALYSIS, COMPLETE (UACMP) WITH MICROSCOPIC
Bilirubin Urine: NEGATIVE
Glucose, UA: NEGATIVE mg/dL
Ketones, ur: NEGATIVE mg/dL
Leukocytes,Ua: NEGATIVE
Nitrite: NEGATIVE
Protein, ur: NEGATIVE mg/dL
RBC / HPF: >50 RBC/hpf (ref 0–5)
Specific Gravity, Urine: 1.013 (ref 1.005–1.030)
pH: 5 (ref 5.0–8.0)

## 2022-07-29 LAB — BASIC METABOLIC PANEL WITH GFR
Anion gap: 5 (ref 5–15)
BUN: 32 mg/dL — ABNORMAL HIGH (ref 8–23)
CO2: 25 mmol/L (ref 22–32)
Calcium: 8.3 mg/dL — ABNORMAL LOW (ref 8.9–10.3)
Chloride: 99 mmol/L (ref 98–111)
Creatinine, Ser: 0.98 mg/dL (ref 0.61–1.24)
GFR, Estimated: >60 mL/min
Glucose, Bld: 122 mg/dL — ABNORMAL HIGH (ref 70–99)
Potassium: 4.5 mmol/L (ref 3.5–5.1)
Sodium: 129 mmol/L — ABNORMAL LOW (ref 135–145)

## 2022-07-29 MED ORDER — AMIODARONE HCL 200 MG PO TABS
200.0000 mg | ORAL_TABLET | Freq: Two times a day (BID) | ORAL | Status: DC
  Administered 2022-07-29 – 2022-08-09 (×22): 200 mg via ORAL
  Filled 2022-07-29 (×22): qty 1

## 2022-07-29 NOTE — Progress Notes (Signed)
Inpatient Rehab Admissions Coordinator:   CIR following. Case sent to insurance yesterday, I await a decision.   Megan Salon, MS, CCC-SLP Rehab Admissions Coordinator  [phone] (celll) [phone] (office)

## 2022-07-29 NOTE — Progress Notes (Signed)
Occupational Therapy Treatment Patient Details Name: Brett Riley MRN: [id number] DOB: June 28, 1935 Today's Date: 07/29/2022   History of present illness 87 y.o. male with a past medical history of coronary artery disease, carotid artery disease status post left carotid endarterectomy, PAD, hypertension, hyperlipidemia, who has been seen and evaluated for cardiac restratification for vascular repair of penetrating aortic ulcer and new onset atrial fibrillation.   OT comments  Brett Riley was seen for OT treatment on this date. Upon arrival to room pt reclined in bed EOB, agreeable to tx. Pt requires MOD A sup<>sit, poor sitting balance however improves with time. Pt reports nausea with moving too quickly. MOD A + RW sit<>stand x3 from elevated height with R knee block, limited standing tolerance. Pt making good progress toward goals, will continue to follow POC. Discharge recommendation remains appropriate.     Recommendations for follow up therapy are one component of a multi-disciplinary discharge planning process, led by the attending physician.  Recommendations may be updated based on patient status, additional functional criteria and insurance authorization.    Assistance Recommended at Discharge Intermittent Supervision/Assistance  Patient can return home with the following  A lot of help with walking and/or transfers;A lot of help with bathing/dressing/bathroom;Assistance with cooking/housework;Assist for transportation;Help with stairs or ramp for entrance   Equipment Recommendations  Other (comment) (defer)    Recommendations for Other Services      Precautions / Restrictions Precautions Precautions: Fall Restrictions Weight Bearing Restrictions: No       Mobility Bed Mobility Overal bed mobility: Needs Assistance Bed Mobility: Supine to Sit, Sit to Supine     Supine to sit: Mod assist Sit to supine: Mod assist        Transfers Overall transfer level: Needs  assistance Equipment used: Rolling walker (2 wheels) Transfers: Sit to/from Stand Sit to Stand: Mod assist, From elevated surface           General transfer comment: R knee blocked x3 trials     Balance Overall balance assessment: Needs assistance Sitting-balance support: Feet supported Sitting balance-Brett Riley Scale: Poor Sitting balance - Comments: requires BUE support   Standing balance support: Bilateral upper extremity supported, Reliant on assistive device for balance Standing balance-Brett Riley Scale: Poor                             ADL either performed or assessed with clinical judgement   ADL Overall ADL's : Needs assistance/impaired                                       General ADL Comments: MAX A don B socks seated EOB      Cognition Arousal/Alertness: Awake/alert Behavior During Therapy: WFL for tasks assessed/performed Overall Cognitive Status: Within Functional Limits for tasks assessed                                                     Pertinent Vitals/ Pain       Pain Assessment Pain Assessment: No/denies pain   Frequency  Min 3X/week        Progress Toward Goals  OT Goals(current goals can now be found in the care plan section)  Progress towards OT goals:  Progressing toward goals  Acute Rehab OT Goals Patient Stated Goal: to walk OT Goal Formulation: With patient/family Time For Goal Achievement: 08/09/22 Potential to Achieve Goals: Fair ADL Goals Pt Will Perform Grooming: with min assist;standing Pt Will Perform Lower Body Dressing: with min assist;sit to/from stand Pt Will Transfer to Toilet: with min assist;ambulating Pt Will Perform Toileting - Clothing Manipulation and hygiene: with min assist;sit to/from stand  Plan Discharge plan remains appropriate;Frequency remains appropriate    Co-evaluation                 AM-PAC OT "6 Clicks" Daily Activity     Outcome Measure    Help from another person eating meals?: None Help from another person taking care of personal grooming?: A Little Help from another person toileting, which includes using toliet, bedpan, or urinal?: Total Help from another person bathing (including washing, rinsing, drying)?: A Lot Help from another person to put on and taking off regular upper body clothing?: A Lot Help from another person to put on and taking off regular lower body clothing?: A Lot 6 Click Score: 14    End of Session    OT Visit Diagnosis: Unsteadiness on feet (R26.81);Muscle weakness (generalized) (M62.81)   Activity Tolerance Patient tolerated treatment well   Patient Left in bed;with call bell/phone within reach;with bed alarm set   Nurse Communication          Time: 1610-9604 OT Time Calculation (min): 17 min  Charges: OT General Charges $OT Visit: 1 Visit OT Treatments $Self Care/Home Management : 8-22 mins  Kathie Dike, M.S. OTR/L  07/29/22, 4:10 PM  ascom [phone]

## 2022-07-29 NOTE — Progress Notes (Signed)
 PHYSICAL MEDICINE AND REHABILITATION  CONSULT SERVICE NOTE   Chart reviewed. Pt discussed with rehab admissions coordinator. 87 yo male with hx of CAD, HTN who initially presented for severe low back pain on 07/20/22. 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 Pt underwent stent placement on 07/24/22. His course has been complicated by new onset A-fib, SIRS, and SIADH. As a result of all the above, he has become substantially deconditioned. With therapy yesterday he was max assist for sit-std transfers and to stand/shuffle feet, limited by fatigue and elevated HR.  Prior to admit pt was independent, driving, working, mowing his yard. He is motivated to regain his functional independence.   Rehab Admissions Coordinator to follow up   Ranelle Oyster, MD, [facility] Physical Medicine & Rehabilitation Medical Director Rehabilitation Services 07/29/2022

## 2022-07-29 NOTE — Progress Notes (Signed)
    Subjective  - POD # 5, status post endovascular repair of penetrating ulcer  Still with mild back pain Has been able to feed himself  Physical Exam:  Looks much better today. Bilateral groins remained soft      Assessment/Plan:  POD #5  Aortic penetrating ulcer: Remains stable from endovascular repair.  A-fib: Cardiology following  Disposition: Patient is exploring options for inpatient rehab  Brett Riley 07/29/2022 6:20 PM --  Vitals:   07/29/22 1246 07/29/22 1608  BP: (!) 159/71 (!) 167/72  Pulse: 75 80  Resp: 18 16  Temp: 98.1 F (36.7 C) 97.9 F (36.6 C)  SpO2: 96% 96%    Intake/Output Summary (Last 24 hours) at 07/29/2022 1820 Last data filed at 07/29/2022 1430 Gross per 24 hour  Intake 480 ml  Output 1150 ml  Net -670 ml     Laboratory CBC    Component Value Date/Time   WBC 14.8 (H) 07/29/2022 0412   HGB 12.1 (L) 07/29/2022 0412   HCT 34.6 (L) 07/29/2022 0412   PLT 201 07/29/2022 0412    BMET    Component Value Date/Time   NA 129 (L) 07/29/2022 0412   NA 141 03/24/2019 1046   K 4.5 07/29/2022 0412   CL 99 07/29/2022 0412   CO2 25 07/29/2022 0412   GLUCOSE 122 (H) 07/29/2022 0412   BUN 32 (H) 07/29/2022 0412   BUN 18 03/24/2019 1046   CREATININE 0.98 07/29/2022 0412   CALCIUM 8.3 (L) 07/29/2022 0412   GFRNONAA >60 07/29/2022 0412   GFRAA >60 09/19/2019 0935    COAG Lab Results  Component Value Date   INR 1.3 (H) 07/23/2022   INR 1.08 02/13/2016   INR 0.97 01/18/2016   No results found for: "PTT"  Antibiotics Anti-infectives (From admission, onward)    Start     Dose/Rate Route Frequency Ordered Stop   07/24/22 0600  ceFAZolin (ANCEF) IVPB 2g/100 mL premix        2 g 200 mL/hr over 30 Minutes Intravenous On call to O.R. 07/23/22 2110 07/24/22 1318        V. Charlena Cross, M.D., [facility] [place] Office: [phone] Pager:  [phone]

## 2022-07-29 NOTE — Progress Notes (Signed)
Cardiology Progress Note   Patient Name: Brett Riley Date of Encounter: 07/29/2022  Primary Cardiologist: Lorine Bears, MD  Subjective   Converted to sinus 5/26.  Back still hurts.  Wasn't able to get out of bed yesterday.  Inpatient Medications    Scheduled Meds:  amiodarone  400 mg Oral BID   apixaban  5 mg Oral BID   aspirin EC  81 mg Oral Daily   Chlorhexidine Gluconate Cloth  6 each Topical Daily   cyanocobalamin  1,000 mcg Oral Daily   diltiazem  360 mg Oral Daily   metoprolol succinate  50 mg Oral BID   multivitamin with minerals  1 tablet Oral Daily   potassium chloride  20 mEq Oral Daily   rosuvastatin  40 mg Oral Daily   sodium chloride  1 g Oral BID WC   Continuous Infusions:  PRN Meds: acetaminophen **OR** acetaminophen, hydrALAZINE, magnesium hydroxide, metoprolol tartrate, ondansetron (ZOFRAN) IV, ondansetron (ZOFRAN) IV, mouth rinse, oxyCODONE-acetaminophen, sodium chloride, traZODone   Vital Signs    Vitals:   07/28/22 1938 07/29/22 0007 07/29/22 0327 07/29/22 0847  BP: (!) 162/73 (!) 147/64 (!) 141/63 (!) 164/70  Pulse: 79 74 65 74  Resp: 19 18 18 16   Temp: 99.3 F (37.4 C) 98.4 F (36.9 C) 98.4 F (36.9 C) 97.8 F (36.6 C)  TempSrc: Oral Oral Oral   SpO2: 93% 94% 94% 94%  Weight:      Height:        Intake/Output Summary (Last 24 hours) at 07/29/2022 0958 Last data filed at 07/29/2022 0531 Gross per 24 hour  Intake --  Output 1100 ml  Net -1100 ml   Filed Weights   07/20/22 2107 07/23/22 0404 07/24/22 1225  Weight: 88 kg 89.9 kg 89.9 kg    Physical Exam   GEN: Well nourished, well developed, in no acute distress.  HEENT: Grossly normal.  Neck: Supple, no JVD, carotid bruits, or masses. Cardiac: RRR, no murmurs, rubs, or gallops. No clubbing, cyanosis, edema.  Radials 2+, DP/PT 2+ and equal bilaterally.  Respiratory:  Respirations regular and unlabored, diminished breath sounds @ bases. GI: Semi-firm/protuberant.  Nontender,  nondistended, BS + x 4. MS: no deformity or atrophy. Skin: warm and dry, no rash. Neuro:  Strength and sensation are intact. Psych: AAOx3.  Normal affect.  Labs    Chemistry Recent Labs  Lab 07/27/22 0626 07/28/22 0424 07/29/22 0412  NA 128* 127* 129*  K 4.1 4.1 4.5  CL 99 98 99  CO2 23 23 25   GLUCOSE 111* 116* 122*  BUN 29* 32* 32*  CREATININE 0.92 0.94 0.98  CALCIUM 7.8* 8.0* 8.3*  GFRNONAA >60 >60 >60  ANIONGAP 6 6 5      Hematology Recent Labs  Lab 07/26/22 0618 07/27/22 0626 07/29/22 0412  WBC 10.3 10.1 14.8*  RBC 3.97* 3.82* 3.76*  HGB 12.7* 12.2* 12.1*  HCT 37.2* 35.8* 34.6*  MCV 93.7 93.7 92.0  MCH 32.0 31.9 32.2  MCHC 34.1 34.1 35.0  RDW 13.2 13.4 13.3  PLT 118* 132* 201   BNP    Component Value Date/Time   BNP 329.1 (H) 07/21/2022 0447    Lipids  Lab Results  Component Value Date   CHOL 129 06/06/2022   HDL 34.80 (L) 06/06/2022   LDLCALC 62 06/06/2022   TRIG 161.0 (H) 06/06/2022   CHOLHDL 4 06/06/2022   Radiology    DG Chest Port 1 View  Result Date: 07/27/2022 CLINICAL DATA:  Decreased breath  sounds at the left lung base. EXAM: PORTABLE CHEST 1 VIEW COMPARISON:  Radiograph 07/20/2022 FINDINGS: There are increasing heterogeneous opacities in both lung bases, greatest in the retrocardiac region. There may be slight vascular congestion. Stable heart size and mediastinal contours. Aortic atherosclerosis. No large pleural effusion. No pneumothorax. IMPRESSION: Increasing heterogeneous opacities in both lung bases, greatest in the retrocardiac region, may be atelectasis or pneumonia. Electronically Signed   By: Narda Rutherford M.D.   On: 07/27/2022 19:52    Telemetry    Sinus rhythm since 5/26 @ 15:44 - Personally Reviewed  Cardiac Studies   2D Echocardiogram 5.20.2024    1. Left ventricular ejection fraction, by estimation, is 55 to 60%. The  left ventricle has normal function. The left ventricle has no regional  wall motion  abnormalities. Left ventricular diastolic parameters are  consistent with Grade I diastolic  dysfunction (impaired relaxation).   2. Right ventricular systolic function is normal. The right ventricular  size is normal. There is normal pulmonary artery systolic pressure.   3. The mitral valve is normal in structure. Mild to moderate mitral valve  regurgitation. No evidence of mitral stenosis. Moderate mitral annular  calcification.   4. The aortic valve is normal in structure. Aortic valve regurgitation is  trivial. Aortic valve sclerosis is present, with no evidence of aortic  valve stenosis.   5. The inferior vena cava is normal in size with greater than 50%  respiratory variability, suggesting right atrial pressure of 3 mmHg.   Patient Profile     87 y.o. male with a past medical history of coronary artery disease, carotid artery disease status post left carotid endarterectomy, PAD, hypertension, hyperlipidemia, who has been seen and evaluated for cardiac restratification for vascular repair of penetrating aortic ulcer and now new onset atrial fibrillation.   Assessment & Plan    1.  Penetrating ulcer of the abdominal aorta: Status post EVAR on May 22.  Currently on asa/eliquis.     2.  Persistent atrial fibrillation: Patient developed atrial fibrillation early on May 21.  Efforts @ rate control w/ benign essential tremor and dilt insufficient and we added oral amio on 5/25.  Rates better - 70's to 90's, though still in Afib.  Cont metoprolol.  If rates start climbing w/ activity would look to titrate ? blocker further.  Cont eliquis.  Provided rates stable and asymptomatic, may be able to defer DCCV for 3-4 wks.     3.  Essential HTN: BPs higher in past 18 hrs.  Trended more normally throughout the day yesterday.  F/u after AM meds (dilt cd and toprol xl).  Consider ARB for persistent HTN.   4.  Hyperlipidemia: Continue statin therapy.  LDL of 62.    Signed, Nicolasa Ducking, NP   07/29/2022, 9:58 AM    For questions or updates, please contact   Please consult [email or web address] for contact info under Cardiology/STEMI.

## 2022-07-29 NOTE — TOC Progression Note (Signed)
Transition of Care [facility]) - Progression Note    Patient Details  Name: Brett Riley MRN: [id number] Date of Birth: Jul 10, 1935  Transition of Care [facility]) CM/SW Contact  Garret Reddish, RN Phone Number: 07/29/2022, 11:43 AM  Clinical Narrative:  Call placed to [facility] Acute inpatient rehab.  Their office is closed today.  TOC will follow up with their office on tommorow.  Noted [facility] provider consulted on patient today.    TOC will continue to follow for discharge planning.           Expected Discharge Plan and Services                                               Social Determinants of Health (SDOH) Interventions SDOH Screenings   Food Insecurity: No Food Insecurity (07/20/2022)  Housing: Low Risk  (07/20/2022)  Transportation Needs: No Transportation Needs (07/20/2022)  Utilities: Not At Risk (07/20/2022)  Depression (PHQ2-9): Low Risk  (06/06/2022)  Financial Resource Strain: Low Risk  (08/29/2020)  Physical Activity: Insufficiently Active (08/29/2020)  Social Connections: Unknown (08/29/2020)  Stress: No Stress Concern Present (08/29/2020)  Tobacco Use: Low Risk  (07/24/2022)    Readmission Risk Interventions     No data to display

## 2022-07-29 NOTE — Progress Notes (Addendum)
Progress Note   Patient: Brett Riley DOB: Jan 14, 1936 DOA: 07/20/2022     9 DOS: the patient was seen and examined on 07/29/2022     Subjective:  Patient seen and examined this morning in the presence of the wife  He had some complaints of low back pain for which x-ray was requested He denied numbness or associated weakness or tingling sensation of the extremities He has started working with physical therapist Denies nausea vomiting chest pain or cough       Brief Narrative / Hospital Course:  Brett Riley is a 87 y.o. Caucasian male with medical history significant for coronary artery disease, hypertension, dyslipidemia and urolithiasis, who presented to the ER with acute onset of bilateral low back pain that started 07/20/2022 in the morning after he got out of bed.  He was barely able to stand per his wife.  He had nausea around  noon. Came to ED.  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 No aneurysm or dissection. 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 Dr Evie Lacks w/ vascular surgery team was consulted by EDP - Since no other explanation for back pain, aortic ulceration is suspected to be the culprit. Dr. Evie Lacks recs hospitalization, pt admitted to hospitalist service.  05/19: Vasc surg planning endovascular intervention if/when cleared per cardiology, recs for cardiology eval and Echo.  05/20: echocardiogram pending - stable LV systolic function and no new wall motion abnormality --> patient will be able to proceed with endovascular repair without further cardiac intervention  05/21: converted into atrial fibrillation with RVR and required diltiazem and metoprolol.  Rate was controlled after this and did not require diltiazem drip. Starting heparin gtt. Await vascular procedure planned for tomorrow    Consultants:  Vascular  surgery Cardiology    Procedures: 07/24/2022-stent placement by vascular surgeon   ASSESSMENT & PLAN:   Principal Problem:   Penetrating atherosclerotic ulcer of aorta (HCC) Active Problems:   Uncontrolled hypertension   Coronary artery disease   Hypoxemia   Preop cardiovascular exam   Dyslipidemia   PVD (peripheral vascular disease) (HCC)   Atrial fibrillation (HCC)     Penetrating atherosclerotic ulcer of aorta (HCC) with bilateral leg and back pain Pain management Currently on aspirin and Eliquis Plavix discontinued according to recommendation by vascular surgeon high-dose statin. Strict blood pressure control. Vascular surgery status post stent placement done on 07/24/2022 We will continue to down titrate pain medication as needed X-ray of the lumbar spine did not show acute pathology but rather advanced lower lumbar disc degeneration    Leukocytosis of unclear etiology Patient admits to frequency but no dysuria Urinalysis requested  Atrial fibrillation new onset 05/20 22:58 heparin infusion for CHA2DS2-VASc score of at least 4 Continue metoprolol with dose adjustment by cardiologist Monitor electrolytes and replete as needed TSH 2.922 Continue on telemetry monitoring Patient currently transitioned from heparin drip to Eliquis by cardiologist   SIRS d/t pain/aortic ulcer No infectious source or symptoms WBC to WNL, HR was improving, tachcyardia now d/t afib  Monitor    Hyponatremia likely secondary to SIADH Patient was on IV fluid with no improvement urine osmolarity 520 urine sodium 59 serum osmolarity 277   IV fluid discontinued Fluid restriction I have counseled the patient and the wife to limit fluid intake to 1.5 L/day Continue sodium chloride tablets Monitor BMP  Essential hypertension Metoprolol for rate control and BP Holding usual home meds d/t soft BP as needed IV labetalol and hydralazine for adequate BP control.   Dyslipidemia increase  statin therapy dose.   Coronary artery disease continue aspirin Plavix as well as statin therapy, ACE inhibitor with lisinopril and beta-blocker with Toprol-XL   Hypoxemia early admission - pt is not taking good breaths d/t pain  (+)D-Dimer likely d/t inflammation has not had any dyspnea or cough or wheezing, no chest pain, no pleuritic chest pain Defer PE w/u, [place] 0  Avoid excessive use contrast, pt also does not want to move for any tests d/t back pain        DVT prophylaxis: Eliquis     Code Status: FULL CODE      Current Admission Status: inpatient     TOC needs / Dispo plan: TBD   Barriers to discharge / significant pending items: Pending placement   Family Communication: wife      Physical Exam Constitutional:      General: He is not in acute distress. Cardiovascular:     Rate and Rhythm: Normal rate. Rhythm irregular.     Heart sounds: Normal heart sounds.  Pulmonary:     Effort: Pulmonary effort is normal.     Breath sounds: Normal breath sounds.  Abdominal:     General: Abdomen is flat.     Palpations: Abdomen is soft.  Musculoskeletal:     Right lower leg: No edema.     Left lower leg: No edema.  Neurological:     General: No focal deficit present.     Mental Status: He is alert.  Psychiatric:        Mood and Affect: Mood normal.        Behavior: Behavior normal.        Data Reviewed: I have personally reviewed patient's laboratory results showing sodium 129 potassium 4.5 creatinine 0.9 WBC increased to 14.8 hemoglobin 12.1.   time spent: 42 minutes   Vitals:   07/29/22 0327 07/29/22 0847 07/29/22 1246 07/29/22 1608  BP: (!) 141/63 (!) 164/70 (!) 159/71 (!) 167/72  Pulse: 65 74 75 80  Resp: 18 16 18 16   Temp: 98.4 F (36.9 C) 97.8 F (36.6 C) 98.1 F (36.7 C) 97.9 F (36.6 C)  TempSrc: Oral  Oral Oral  SpO2: 94% 94% 96% 96%  Weight:      Height:         Author: Loyce Dys, MD 07/29/2022 5:07 PM  For on call review  [email or web address].

## 2022-07-30 ENCOUNTER — Encounter: Payer: Self-pay | Admitting: Vascular Surgery

## 2022-07-30 DIAGNOSIS — I4819 Other persistent atrial fibrillation: Secondary | ICD-10-CM | POA: Diagnosis not present

## 2022-07-30 DIAGNOSIS — I719 Aortic aneurysm of unspecified site, without rupture: Secondary | ICD-10-CM | POA: Diagnosis not present

## 2022-07-30 LAB — BASIC METABOLIC PANEL WITH GFR
Anion gap: 9 (ref 5–15)
BUN: 35 mg/dL — ABNORMAL HIGH (ref 8–23)
CO2: 24 mmol/L (ref 22–32)
Calcium: 8.6 mg/dL — ABNORMAL LOW (ref 8.9–10.3)
Chloride: 97 mmol/L — ABNORMAL LOW (ref 98–111)
Creatinine, Ser: 1.04 mg/dL (ref 0.61–1.24)
GFR, Estimated: >60 mL/min
Glucose, Bld: 116 mg/dL — ABNORMAL HIGH (ref 70–99)
Potassium: 4.6 mmol/L (ref 3.5–5.1)
Sodium: 130 mmol/L — ABNORMAL LOW (ref 135–145)

## 2022-07-30 LAB — CBC WITH DIFFERENTIAL/PLATELET
Abs Immature Granulocytes: 0.17 10*3/uL — ABNORMAL HIGH (ref 0.00–0.07)
Basophils Absolute: 0 10*3/uL (ref 0.0–0.1)
Basophils Relative: 0 %
Eosinophils Absolute: 0.1 10*3/uL (ref 0.0–0.5)
Eosinophils Relative: 0 %
HCT: 36 % — ABNORMAL LOW (ref 39.0–52.0)
Hemoglobin: 12.7 g/dL — ABNORMAL LOW (ref 13.0–17.0)
Immature Granulocytes: 1 %
Lymphocytes Relative: 9 %
Lymphs Abs: 1.2 10*3/uL (ref 0.7–4.0)
MCH: 32.4 pg (ref 26.0–34.0)
MCHC: 35.3 g/dL (ref 30.0–36.0)
MCV: 91.8 fL (ref 80.0–100.0)
Monocytes Absolute: 0.6 10*3/uL (ref 0.1–1.0)
Monocytes Relative: 4 %
Neutro Abs: 11.6 10*3/uL — ABNORMAL HIGH (ref 1.7–7.7)
Neutrophils Relative %: 86 %
Platelets: 237 10*3/uL (ref 150–400)
RBC: 3.92 MIL/uL — ABNORMAL LOW (ref 4.22–5.81)
RDW: 13.4 % (ref 11.5–15.5)
WBC: 13.5 10*3/uL — ABNORMAL HIGH (ref 4.0–10.5)
nRBC: 0 % (ref 0.0–0.2)

## 2022-07-30 MED ORDER — TRAMADOL HCL 50 MG PO TABS
50.0000 mg | ORAL_TABLET | Freq: Two times a day (BID) | ORAL | Status: DC | PRN
  Administered 2022-07-30 – 2022-07-31 (×2): 50 mg via ORAL
  Filled 2022-07-30 (×2): qty 1

## 2022-07-30 NOTE — Progress Notes (Addendum)
Progress Note   Patient: Brett Riley [name] DOB: [birth year] DOA: 07/20/2022     10 DOS: the patient was seen and examined on 07/30/2022      Subjective:  Patient seen and examined this morning in the presence of the wife  Patient continues to await placement Wife complained of patient having some hallucination last night however this morning no complaints, likely secondary to hospital-acquired delirium/sundowning. He has started working with physical therapist Denies nausea vomiting chest pain or cough       Brief Narrative / Hospital Course:  Brett Riley is a 87 y.o. Caucasian male with medical history significant for coronary artery disease, hypertension, dyslipidemia and urolithiasis, who presented to the ER with acute onset of bilateral low back pain that started 07/20/2022 in the morning after he got out of bed.  He was barely able to stand per his wife.  He had nausea around  noon. Came to ED.  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 No aneurysm or dissection. 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 Dr Evie Lacks w/ vascular surgery team was consulted by EDP - Since no other explanation for back pain, aortic ulceration is suspected to be the culprit. Dr. Evie Lacks recs hospitalization, pt admitted to hospitalist service.  05/19: Vasc surg planning endovascular intervention if/when cleared per cardiology, recs for cardiology eval and Echo.  05/20: echocardiogram pending - stable LV systolic function and no new wall motion abnormality --> patient will be able to proceed with endovascular repair without further cardiac intervention  05/21: converted into atrial fibrillation with RVR and required diltiazem and metoprolol.  Rate was controlled after this and did not require diltiazem drip. Starting heparin gtt. Await vascular procedure planned for  tomorrow  5/21-5/28: Postoperatively patient has been working with physical therapy awaiting placement.  Has had some intermittent hallucination due to possible delirium.   Consultants:  Vascular surgery Cardiology    Procedures: 07/24/2022-stent placement by vascular surgeon   ASSESSMENT & PLAN:   Principal Problem:   Penetrating atherosclerotic ulcer of aorta (HCC) Active Problems:   Uncontrolled hypertension   Coronary artery disease   Hypoxemia   Preop cardiovascular exam   Dyslipidemia   PVD (peripheral vascular disease) (HCC)   Atrial fibrillation (HCC)     Penetrating atherosclerotic ulcer of aorta (HCC) with bilateral leg and back pain Patient's family concerned that oxycodone may be making patient feel weak and so this have been changed to tramadol We will continue to wean down pain medication as tolerated Currently on aspirin and Eliquis Plavix discontinued according to recommendation by vascular surgeon high-dose statin. Strict blood pressure control. Vascular surgery status post stent placement done on 07/24/2022 We will continue to down titrate pain medication as needed X-ray of the lumbar spine did not show acute pathology but rather advanced lower lumbar disc degeneration     Leukocytosis of unclear etiology Patient admits to frequency but no dysuria Urinalysis did not show UTI   Atrial fibrillation new onset 05/20 22:58 heparin infusion for CHA2DS2-VASc score of at least 4 Continue metoprolol with dose adjustment by cardiologist To continue amiodarone 200 mg twice daily for 2 weeks then reduce to 200 mg daily as recommended by cardiologist Outpatient follow-up with cardiologist at discharge Monitor electrolytes and replete as needed TSH 2.922 Continue on telemetry monitoring Patient currently transitioned from heparin drip to Eliquis by  cardiologist   SIRS d/t pain/aortic ulcer No infectious source or symptoms WBC to WNL, HR was improving,  tachcyardia now d/t afib  Monitor    Hyponatremia likely secondary to SIADH Patient was on IV fluid with no improvement urine osmolarity 520 urine sodium 59 serum osmolarity 277   IV fluid discontinued Fluid restriction I have counseled the patient and the wife to limit fluid intake to 1.5 L/day Continue sodium chloride tablets Monitor BMP   Essential hypertension Metoprolol for rate control and BP    Dyslipidemia increase statin therapy dose.   Coronary artery disease continue aspirin as well as statin therapy, ACE inhibitor with lisinopril and beta-blocker with Toprol-XL   Hypoxemia early admission - pt is not taking good breaths d/t pain  (+)D-Dimer likely d/t inflammation has not had any dyspnea or cough or wheezing, no chest pain, no pleuritic chest pain Defer PE w/u, Wells 0        DVT prophylaxis: Eliquis     Code Status: FULL CODE      Current Admission Status: inpatient     TOC needs / Dispo plan: TBD   Barriers to discharge / significant pending items: Pending placement   Family Communication: wife      Physical Exam Constitutional:      General: He is not in acute distress. Cardiovascular:     Rate and Rhythm: Normal rate. Rhythm irregular.     Heart sounds: Normal heart sounds.  Pulmonary:     Effort: Pulmonary effort is normal.     Breath sounds: Normal breath sounds.  Abdominal:     General: Abdomen is flat.     Palpations: Abdomen is soft.  Musculoskeletal:     Right lower leg: No edema.     Left lower leg: No edema.  Weakness in bilateral lower extremity on account of deconditioning Neurological:     General: No focal deficit present.     Mental Status: He is alert.  Psychiatric:        Mood and Affect: Mood normal.        Behavior: Behavior normal.        Data Reviewed: I have personally reviewed patient's laboratory results showing sodium 130 potassium 4.6 creatinine 1.0 WBC 13.5 hemoglobin 12.7   time spent: 43 minutes      Vitals:   07/29/22 2158 07/29/22 2339 07/30/22 0538 07/30/22 0805  BP: (!) 152/73 137/64 (!) 157/68 (!) 152/80  Pulse: 79 74 86 81  Resp: 20 19 18 16   Temp:  98.2 F (36.8 C) 97.6 F (36.4 C) 98.3 F (36.8 C)  TempSrc:    Oral  SpO2: 92% 93% 99% 93%  Weight:      Height:        Author: Loyce Dys, MD 07/30/2022 4:22 PM  For on call review [email or web address].

## 2022-07-30 NOTE — TOC Progression Note (Addendum)
Transition of Care [facility]) - Progression Note    Patient Details  Name: Brett Riley MRN: [id number] Date of Birth: Jun 07, 1935  Transition of [facility]) CM/SW Contact  Margarito Liner, LCSW Phone Number: 07/30/2022, 10:05 AM  Clinical Narrative:  Faxed referral to [facility] for review.   2:01 pm: Left voicemail for admissions coordinator at [facility] to see if they have reviewed the referral.  3:42 pm: [facility] is unable to offer a bed. Wife and [facility] admissions coordinator are aware. Will start SNF workup as backup plan in case insurance does not approve inpatient rehab. Also left voicemail for [facility] to see if they could review referral.  Expected Discharge Plan and Services                                               Social Determinants of Health (SDOH) Interventions SDOH Screenings   Food Insecurity: No Food Insecurity (07/20/2022)  Housing: Low Risk  (07/20/2022)  Transportation Needs: No Transportation Needs (07/20/2022)  Utilities: Not At Risk (07/20/2022)  Depression (PHQ2-9): Low Risk  (06/06/2022)  Financial Resource Strain: Low Risk  (08/29/2020)  Physical Activity: Insufficiently Active (08/29/2020)  Social Connections: Unknown (08/29/2020)  Stress: No Stress Concern Present (08/29/2020)  Tobacco Use: Low Risk  (07/30/2022)    Readmission Risk Interventions     No data to display

## 2022-07-30 NOTE — Progress Notes (Signed)
Inpatient Rehab Admissions Coordinator:    CIR following. Insurance case is pending. I spoke with pt.'s wife Gershon Cull and she stated that her first preference would actually  be [facility]'s rehab at [facility], as that is closer to their home; however, if [facility] declines, she is open to admission at [facility].  I explained that if case is approved by Pt.'s insurance, I will attempt to transfer auth to [facility] (if accepted), but that this is not always possible. I also explained that if we received a denial for CIR, then insurance likely would not approve [facility]'s AIR.   Megan Salon, MS, CCC-SLP Rehab Admissions Coordinator  [phone] (celll) [phone] (office)

## 2022-07-30 NOTE — Progress Notes (Signed)
Physical Therapy Treatment Patient Details Name: Brett Riley MRN: [id number] DOB: 01-16-1936 Today's Date: 07/30/2022   History of Present Illness 87 y.o. male with a past medical history of coronary artery disease, carotid artery disease status post left carotid endarterectomy, PAD, hypertension, hyperlipidemia, who has been seen and evaluated for cardiac restratification for vascular repair of penetrating aortic ulcer and new onset atrial fibrillation.    PT Comments    Pt alert, agreeable to PT with motivation, reported 8/10 low back pain and RN in room to administer pain medication. Overall pt appeared to need more physical assistance this session, limited by pain and fatigue. modAx2 to sit EOB, progressed to sitting with fair balance but predominantly relied on BUE support to maintain midline. Sit <> stand twice, second attempt more successful with constant R knee block. He was able to take very minimal steps towards [facility] with modAx2, reliance on RW and assistance for RLE placement. Returned to supine modAx2 and left in sidelying, needs in reach. The patient would benefit from further skilled PT intervention to continue to progress towards goals.      Recommendations for follow up therapy are one component of a multi-disciplinary discharge planning process, led by the attending physician.  Recommendations may be updated based on patient status, additional functional criteria and insurance authorization.  Follow Up Recommendations  Can patient physically be transported by private vehicle: No    Assistance Recommended at Discharge Frequent or constant Supervision/Assistance  Patient can return home with the following Two people to help with walking and/or transfers;Two people to help with bathing/dressing/bathroom;Assistance with cooking/housework;Assist for transportation;Help with stairs or ramp for entrance   Equipment Recommendations  Other (comment) (TBD at next venue of care)     Recommendations for Other Services       Precautions / Restrictions Precautions Precautions: Fall Restrictions Weight Bearing Restrictions: No     Mobility  Bed Mobility Overal bed mobility: Needs Assistance Bed Mobility: Supine to Sit, Sit to Supine     Supine to sit: Mod assist, +2 for physical assistance Sit to supine: Mod assist, +2 for physical assistance   General bed mobility comments: with time pt able to to progress to sitting EOB propped on RW, noted for posterior lean    Transfers Overall transfer level: Needs assistance Equipment used: Rolling walker (2 wheels) Transfers: Sit to/from Stand Sit to Stand: Mod assist, +2 physical assistance           General transfer comment: R knee block    Ambulation/Gait               General Gait Details: 1-2 very limited side steps at EOB, assistance for R knee in stance as well as assistance to lift and place for side stepping   Stairs             Wheelchair Mobility    Modified Rankin (Stroke Patients Only)       Balance Overall balance assessment: Needs assistance Sitting-balance support: Feet supported Sitting balance-Leahy Scale: Poor Sitting balance - Comments: progressed to fair with BUE support but able to hold momentarily without RW       Standing balance comment: limited by R knee buckling, constant physical assistance/facilitation                            Cognition Arousal/Alertness: Awake/alert Behavior During Therapy: WFL for tasks assessed/performed Overall Cognitive Status: Within Functional Limits for tasks assessed  General Comments: HOH        Exercises      General Comments        Pertinent Vitals/Pain Pain Assessment Pain Assessment: 0-10 Pain Score: 8  Pain Location: back pain primarily, reports knee joint pain (R>L) Pain Descriptors / Indicators: Aching, Discomfort, Grimacing, Guarding Pain  Intervention(s): Limited activity within patient's tolerance, Monitored during session, Patient requesting pain meds-RN notified, Repositioned    Home Living                          Prior Function            PT Goals (current goals can now be found in the care plan section) Progress towards PT goals: Progressing toward goals    Frequency    Min 4X/week      PT Plan Current plan remains appropriate    Co-evaluation              AM-PAC PT "6 Clicks" Mobility   Outcome Measure  Help needed turning from your back to your side while in a flat bed without using bedrails?: A Lot Help needed moving from lying on your back to sitting on the side of a flat bed without using bedrails?: A Lot Help needed moving to and from a bed to a chair (including a wheelchair)?: Total Help needed standing up from a chair using your arms (e.g., wheelchair or bedside chair)?: Total Help needed to walk in hospital room?: Total Help needed climbing 3-5 steps with a railing? : Total 6 Click Score: 8    End of Session Equipment Utilized During Treatment: Gait belt;Oxygen Activity Tolerance: Patient limited by pain;Patient limited by fatigue Patient left: with bed alarm set;with call bell/phone within reach;in bed Nurse Communication: Mobility status PT Visit Diagnosis: Muscle weakness (generalized) (M62.81);Difficulty in walking, not elsewhere classified (R26.2)     Time: 1438-1500 PT Time Calculation (min) (ACUTE ONLY): 22 min  Charges:  $Therapeutic Activity: 8-22 mins                     Olga Coaster PT, DPT 4:26 PM,07/30/22

## 2022-07-30 NOTE — Progress Notes (Addendum)
Rounding Note    Patient Name: Brett Riley Date of Encounter: 07/30/2022  [facility] Cardiologist: Lorine Bears, MD   Subjective   Patient remains in NSR. Patient reports low back pain. No chest pain or SOB. Does not have his hear aids in. Plan for inpatient rehab at d/c.  Inpatient Medications    Scheduled Meds:  amiodarone  200 mg Oral BID   apixaban  5 mg Oral BID   aspirin EC  81 mg Oral Daily   Chlorhexidine Gluconate Cloth  6 each Topical Daily   cyanocobalamin  1,000 mcg Oral Daily   diltiazem  360 mg Oral Daily   metoprolol succinate  50 mg Oral BID   multivitamin with minerals  1 tablet Oral Daily   potassium chloride  20 mEq Oral Daily   rosuvastatin  40 mg Oral Daily   sodium chloride  1 g Oral BID WC   Continuous Infusions:  PRN Meds: acetaminophen **OR** acetaminophen, hydrALAZINE, magnesium hydroxide, metoprolol tartrate, ondansetron (ZOFRAN) IV, ondansetron (ZOFRAN) IV, mouth rinse, oxyCODONE-acetaminophen, sodium chloride, traZODone   Vital Signs    Vitals:   07/29/22 2158 07/29/22 2339 07/30/22 0538 07/30/22 0805  BP: (!) 152/73 137/64 (!) 157/68 (!) 152/80  Pulse: 79 74 86 81  Resp: 20 19 18 16   Temp:  98.2 F (36.8 C) 97.6 F (36.4 C) 98.3 F (36.8 C)  TempSrc:    Oral  SpO2: 92% 93% 99% 93%  Weight:      Height:        Intake/Output Summary (Last 24 hours) at 07/30/2022 1058 Last data filed at 07/30/2022 1010 Gross per 24 hour  Intake 840 ml  Output 1900 ml  Net -1060 ml      07/24/2022   12:25 PM 07/23/2022    4:04 AM 07/20/2022    9:07 PM  Last 3 Weights  Weight (lbs) 198 lb 3.1 oz 198 lb 1.6 oz 194 lb  Weight (kg) 89.9 kg 89.858 kg 87.998 kg      Telemetry    NSR HR 70, PVCs - Personally Reviewed  ECG    No new - Personally Reviewed  Physical Exam   GEN: No acute distress.   Neck: No JVD Cardiac: RRR, no murmurs, rubs, or gallops.  Respiratory: Clear to auscultation bilaterally. GI: Soft,  nontender, non-distended  MS: No edema; No deformity. Neuro:  Nonfocal  Psych: Normal affect   Labs    High Sensitivity Troponin:  No results for input(s): "TROPONINIHS" in the last 720 hours.   Chemistry Recent Labs  Lab 07/28/22 0424 07/29/22 0412 07/30/22 0415  NA 127* 129* 130*  K 4.1 4.5 4.6  CL 98 99 97*  CO2 23 25 24   GLUCOSE 116* 122* 116*  BUN 32* 32* 35*  CREATININE 0.94 0.98 1.04  CALCIUM 8.0* 8.3* 8.6*  MG 2.4  --   --   GFRNONAA >60 >60 >60  ANIONGAP 6 5 9     Lipids No results for input(s): "CHOL", "TRIG", "HDL", "LABVLDL", "LDLCALC", "CHOLHDL" in the last 168 hours.  Hematology Recent Labs  Lab 07/27/22 0626 07/29/22 0412 07/30/22 0415  WBC 10.1 14.8* 13.5*  RBC 3.82* 3.76* 3.92*  HGB 12.2* 12.1* 12.7*  HCT 35.8* 34.6* 36.0*  MCV 93.7 92.0 91.8  MCH 31.9 32.2 32.4  MCHC 34.1 35.0 35.3  RDW 13.4 13.3 13.4  PLT 132* 201 237   Thyroid No results for input(s): "TSH", "FREET4" in the last 168 hours.  BNPNo results for input(s): "BNP", "PROBNP" in the last 168 hours.  DDimer No results for input(s): "DDIMER" in the last 168 hours.   Radiology    DG Lumbar Spine 2-3 Views  Result Date: 07/29/2022 CLINICAL DATA:  Low back pain due to bilateral sciatica. EXAM: LUMBAR SPINE - 2-3 VIEW COMPARISON:  Lumbar spine radiographs 01/10/2020. CTA abdomen and pelvis 07/20/2022. FINDINGS: There are 5 non rib-bearing lumbar type vertebrae. There is straightening of the normal lumbar lordosis with minimal retrolisthesis of L2 on L3 and L5 on S1 and minimal anterolisthesis of L4 on L5. No acute fracture is identified in the lumbar spine. Assessment of T11 and T12 on the lateral radiograph is limited by slight obliquity and superimposed densities. Disc degeneration is noted diffusely in the lumbar spine, including severe disc space narrowing at L4-5 and L5-S1 which is similar to the prior radiographs. A new aortoiliac stent graft is in place. There is gaseous distension of  multiple bowel loops, incompletely evaluated. IMPRESSION: 1. No acute osseous abnormality identified in the lumbar spine. 2. Advanced lower lumbar disc degeneration. 3. Partially visualized gaseous distension of multiple bowel loops, incompletely evaluated but may reflect ileus. Abdominal radiographs could be obtained if clinically warranted. Electronically Signed   By: Sebastian Ache M.D.   On: 07/29/2022 12:01    Cardiac Studies   2D Echocardiogram 5.20.2024    1. Left ventricular ejection fraction, by estimation, is 55 to 60%. The  left ventricle has normal function. The left ventricle has no regional  wall motion abnormalities. Left ventricular diastolic parameters are  consistent with Grade I diastolic  dysfunction (impaired relaxation).   2. Right ventricular systolic function is normal. The right ventricular  size is normal. There is normal pulmonary artery systolic pressure.   3. The mitral valve is normal in structure. Mild to moderate mitral valve  regurgitation. No evidence of mitral stenosis. Moderate mitral annular  calcification.   4. The aortic valve is normal in structure. Aortic valve regurgitation is  trivial. Aortic valve sclerosis is present, with no evidence of aortic  valve stenosis.   5. The inferior vena cava is normal in size with greater than 50%  respiratory variability, suggesting right atrial pressure of 3 mmHg.     Patient Profile     87 y.o. male with pmh of Cad, carotid artery disease s/p left carotid endarterectomy, PAD, HTN, HLD who is being seen for cardiac restratification for vascular repair of pen etrating aortic ulcer and new onset Afib  Assessment & Plan    Persistent Afib - patient developed Afib on May 21 - rates did not improve with rate control and he was started on amiodarone 5/25 with conversion to SR - patient remains in NSR - continue amiodarone 200mg  daily - he is also on Toprol 50mg  BID and Cardizem 360mg  daily. HR in the  70s  Penetrating ulcer of abdominal aorta - s/p EVAR on 5/22 - con ASA and Eliquis  HLD  - LDL 62 - continue Crestor  For questions or updates, please contact [facility] Please consult [email or web address] for contact info under     Signed, Cadence David Stall, PA-C  07/30/2022, 10:58 AM     I have independently seen and examined the patient and agree with the findings and plan, as documented in the PA/NP's note, with the following additions/changes.  Patient complaining of back and right knee pain today.  He thinks it was exacerbated by physical therapy.  He denies chest pain,  shortness of breath, and palpitations.  Exam notable for regular rate and rhythm without murmurs.  Breath sounds mildly diminished throughout without wheezes or crackles.  Minimal swelling of the right knee noted.  No focal tenderness appreciated.  No lower extremity edema present.  Telemetry shows sinus rhythm.  Ms. Amoroso is stable following EVAR and conversion to sinus rhythm with amiodarone.  He should continue amiodarone 200 mg twice daily for 2 more weeks then reduce this to 200 mg daily.  Continue apixaban and aspirin in the setting of persistent atrial fibrillation and penetrating abdominal aortic ulcer status post EVAR.  Continued secondary prevention with high intensity statin therapy also recommended.  We will sign off at this time.  Mr. Halliday should follow-up with Dr. Kirke Corin or an APP approximately 3 to 4 weeks after hospital discharge.  Yvonne Kendall, MD [facility]

## 2022-07-30 NOTE — Care Management Important Message (Signed)
Important Message  Patient Details  Name: ANUEL QUESNEL MRN: [id number] Date of Birth: 1935-09-17   Medicare Important Message Given:  Yes     [name] 07/30/2022, 11:09 AM

## 2022-07-30 NOTE — [place] FL2 (Signed)
[place] MEDICAID FL2 LEVEL OF CARE FORM     IDENTIFICATION  Patient Name: Brett Riley Birthdate: May 01, 1935 Sex: male Admission Date (Current Location): 07/20/2022  [facility] and [place] Number:  Chiropodist and Address:  [facility], [address] [place], [place] [address]      Provider Number: [id number]  Attending Physician Name and Address:  Loyce Dys, MD  Relative Name and Phone Number:       Current Level of Care: Hospital Recommended Level of Care: Skilled Nursing Facility Prior Approval Number:    Date Approved/Denied:   PASRR Number: [id number]  Discharge Plan: SNF    Current Diagnoses: Patient Active Problem List   Diagnosis Date Noted   Persistent atrial fibrillation (HCC) 07/30/2022   Atrial fibrillation with rapid ventricular response (HCC) 07/23/2022   PVD (peripheral vascular disease) (HCC) 07/21/2022   Penetrating atherosclerotic ulcer of aorta (HCC) 07/20/2022   Uncontrolled hypertension 07/20/2022   Dyslipidemia 07/20/2022   Coronary artery disease 07/20/2022   Hypoxemia 07/20/2022   Scalp lesion 10/14/2021   Abnormal CT of the abdomen 04/09/2021   Open wound of skin 10/16/2020   Atherosclerotic ulcer of aorta (HCC) 08/27/2020   Leg pain 01/10/2020   Fever 10/03/2019   Hypertension 06/01/2019   Aneurysm artery, 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   Dizziness 08/04/2016   Abdominal bruit 08/04/2016   Carotid stenosis, asymptomatic, left 02/15/2016   CAD (coronary artery disease)    Preop cardiovascular exam    Health care maintenance 09/11/2014   Hip region mass 12/12/2013   Soft tissue mass 11/28/2013   Fatigue 07/28/2013   Environmental allergies 07/28/2013   Carotid artery disease (HCC) 01/21/2013   Thrombocytopenia (HCC) 07/26/2012   Anemia 07/26/2012   Essential hypertension, benign 05/24/2012   Hyperlipidemia 05/24/2012    Orientation RESPIRATION BLADDER Height & Weight     Self,  Place  Normal Incontinent, External catheter Weight: 198 lb 3.1 oz (89.9 kg) Height:  6' (182.9 cm)  BEHAVIORAL SYMPTOMS/MOOD NEUROLOGICAL BOWEL NUTRITION STATUS   (None)  (None) Continent Diet (Regular)  AMBULATORY STATUS COMMUNICATION OF NEEDS Skin   Extensive Assist Verbally Surgical wounds, Other (Comment) (Erythema/redness. Incisions on both thighs: gauze, paper tape.)                       Personal Care Assistance Level of Assistance  Bathing, Feeding, Dressing Bathing Assistance: Maximum assistance Feeding assistance: Limited assistance Dressing Assistance: Maximum assistance     Functional Limitations Info  Sight, Hearing, Speech Sight Info: Adequate Hearing Info: Adequate Speech Info: Adequate    SPECIAL CARE FACTORS FREQUENCY  PT (By licensed PT), OT (By licensed OT)     PT Frequency: 5 x week OT Frequency: 5 x week            Contractures Contractures Info: Not present    Additional Factors Info  Code Status, Allergies Code Status Info: Full code Allergies Info: NKDA           Current Medications (07/30/2022):  This is the current hospital active medication list Current Facility-Administered Medications  Medication Dose Route Frequency Provider Last Rate Last Admin   acetaminophen (TYLENOL) tablet 650 mg  650 mg Oral Q6H PRN Annice Needy, MD   650 mg at 07/30/22 1442   Or   acetaminophen (TYLENOL) suppository 650 mg  650 mg Rectal Q6H PRN Annice Needy, MD       amiodarone (PACERONE) tablet 200 mg  200 mg Oral BID Lorine Bears A, MD   200 mg at 07/30/22 1610   apixaban (ELIQUIS) tablet 5 mg  5 mg Oral BID Creig Hines, NP   5 mg at 07/30/22 9604   aspirin EC tablet 81 mg  81 mg Oral Daily Annice Needy, MD   81 mg at 07/30/22 5409   Chlorhexidine Gluconate Cloth 2 % PADS 6 each  6 each Topical Daily Loyce Dys, MD   6 each at 07/30/22 8119   cyanocobalamin (VITAMIN B12) tablet 1,000 mcg  1,000 mcg Oral Daily Annice Needy, MD    1,000 mcg at 07/30/22 1478   diltiazem (CARDIZEM CD) 24 hr capsule 360 mg  360 mg Oral Daily Antonieta Iba, MD   360 mg at 07/30/22 2956   hydrALAZINE (APRESOLINE) injection 10 mg  10 mg Intravenous Q6H PRN Annice Needy, MD       magnesium hydroxide (MILK OF MAGNESIA) suspension 30 mL  30 mL Oral Daily PRN Annice Needy, MD   30 mL at 07/28/22 1633   metoprolol succinate (TOPROL-XL) 24 hr tablet 50 mg  50 mg Oral BID Creig Hines, NP   50 mg at 07/30/22 2130   metoprolol tartrate (LOPRESSOR) injection 5 mg  5 mg Intravenous Q6H PRN Annice Needy, MD       multivitamin with minerals tablet 1 tablet  1 tablet Oral Daily Annice Needy, MD   1 tablet at 07/30/22 0930   ondansetron (ZOFRAN) injection 4 mg  4 mg Intravenous Q4H PRN Annice Needy, MD   4 mg at 07/23/22 2145   ondansetron (ZOFRAN) injection 4 mg  4 mg Intravenous Q6H PRN Annice Needy, MD       Oral care mouth rinse  15 mL Mouth Rinse PRN Loyce Dys, MD       potassium chloride SA (KLOR-CON M) CR tablet 20 mEq  20 mEq Oral Daily Annice Needy, MD   20 mEq at 07/30/22 0930   rosuvastatin (CRESTOR) tablet 40 mg  40 mg Oral Daily Annice Needy, MD   40 mg at 07/30/22 8657   sodium chloride (OCEAN) 0.65 % nasal spray 1 spray  1 spray Each Nare PRN Loyce Dys, MD   1 spray at 07/27/22 1706   sodium chloride tablet 1 g  1 g Oral BID WC Loyce Dys, MD   1 g at 07/30/22 0729   traMADol (ULTRAM) tablet 50 mg  50 mg Oral Q12H PRN Loyce Dys, MD       traZODone (DESYREL) tablet 25 mg  25 mg Oral QHS PRN Annice Needy, MD         Discharge Medications: Please see discharge summary for a list of discharge medications.  Relevant Imaging Results:  Relevant Lab Results:   Additional Information SS#: [id number]  Margarito Liner, LCSW

## 2022-07-31 ENCOUNTER — Inpatient Hospital Stay: Payer: Medicare HMO

## 2022-07-31 DIAGNOSIS — I719 Aortic aneurysm of unspecified site, without rupture: Secondary | ICD-10-CM | POA: Diagnosis not present

## 2022-07-31 DIAGNOSIS — I4891 Unspecified atrial fibrillation: Secondary | ICD-10-CM | POA: Diagnosis not present

## 2022-07-31 DIAGNOSIS — E871 Hypo-osmolality and hyponatremia: Secondary | ICD-10-CM | POA: Diagnosis not present

## 2022-07-31 DIAGNOSIS — E663 Overweight: Secondary | ICD-10-CM | POA: Insufficient documentation

## 2022-07-31 LAB — CBC WITH DIFFERENTIAL/PLATELET
Abs Immature Granulocytes: 0.11 10*3/uL — ABNORMAL HIGH (ref 0.00–0.07)
Basophils Absolute: 0 10*3/uL (ref 0.0–0.1)
Basophils Relative: 0 %
Eosinophils Absolute: 0 10*3/uL (ref 0.0–0.5)
Eosinophils Relative: 0 %
HCT: 32.9 % — ABNORMAL LOW (ref 39.0–52.0)
Hemoglobin: 11.2 g/dL — ABNORMAL LOW (ref 13.0–17.0)
Immature Granulocytes: 1 %
Lymphocytes Relative: 7 %
Lymphs Abs: 1 10*3/uL (ref 0.7–4.0)
MCH: 31.9 pg (ref 26.0–34.0)
MCHC: 34 g/dL (ref 30.0–36.0)
MCV: 93.7 fL (ref 80.0–100.0)
Monocytes Absolute: 0.5 10*3/uL (ref 0.1–1.0)
Monocytes Relative: 4 %
Neutro Abs: 11.3 10*3/uL — ABNORMAL HIGH (ref 1.7–7.7)
Neutrophils Relative %: 88 %
Platelets: 220 10*3/uL (ref 150–400)
RBC: 3.51 MIL/uL — ABNORMAL LOW (ref 4.22–5.81)
RDW: 13.7 % (ref 11.5–15.5)
WBC: 12.9 10*3/uL — ABNORMAL HIGH (ref 4.0–10.5)
nRBC: 0 % (ref 0.0–0.2)

## 2022-07-31 LAB — BASIC METABOLIC PANEL WITH GFR
Anion gap: 10 (ref 5–15)
BUN: 36 mg/dL — ABNORMAL HIGH (ref 8–23)
CO2: 22 mmol/L (ref 22–32)
Calcium: 8.2 mg/dL — ABNORMAL LOW (ref 8.9–10.3)
Chloride: 97 mmol/L — ABNORMAL LOW (ref 98–111)
Creatinine, Ser: 1.15 mg/dL (ref 0.61–1.24)
GFR, Estimated: >60 mL/min
Glucose, Bld: 101 mg/dL — ABNORMAL HIGH (ref 70–99)
Potassium: 4.7 mmol/L (ref 3.5–5.1)
Sodium: 129 mmol/L — ABNORMAL LOW (ref 135–145)

## 2022-07-31 MED ORDER — QUETIAPINE FUMARATE 25 MG PO TABS
25.0000 mg | ORAL_TABLET | Freq: Every day | ORAL | Status: DC
  Administered 2022-07-31: 25 mg via ORAL
  Filled 2022-07-31: qty 1

## 2022-07-31 MED ORDER — OXYCODONE-ACETAMINOPHEN 5-325 MG PO TABS
1.0000 | ORAL_TABLET | ORAL | Status: DC | PRN
  Administered 2022-07-31: 1 via ORAL
  Filled 2022-07-31: qty 1

## 2022-07-31 NOTE — Progress Notes (Signed)
Occupational Therapy Treatment Patient Details Name: Brett Riley MRN: [id number] DOB: Jan 11, 1936 Today's Date: 07/31/2022   History of present illness 87 y.o. male with a past medical history of coronary artery disease, carotid artery disease status post left carotid endarterectomy, PAD, hypertension, hyperlipidemia, who has been seen and evaluated for cardiac restratification for vascular repair of penetrating aortic ulcer and new onset atrial fibrillation.   OT comments  Upon entering the room, pt supine in bed with wife present in room. Pt seen for skilled co-treatment with physical therapy for safety. Pt needing mod A of 2 to come to EOB. Initially pt with posterior bias and leaning heavily to the L but able to achieve static sitting with min guard - min A for several minutes with cuing and manual facilitation. Lateral scoot transfer from bed >recliner chair towards the R with max A of 2. Pt then attempts standing from recliner chair with max A of 2 to come into standing. R knee blocked and posterior bias noted. Unable to facilitate full upright posture this session. Pt returning to recliner chair with hoyer sling underneath and discussion with RN about safe transfer back to bed with hoyer lift for staff safety. Pt continues to benefit from OT intervention and remains far from baseline.    Recommendations for follow up therapy are one component of a multi-disciplinary discharge planning process, led by the attending physician.  Recommendations may be updated based on patient status, additional functional criteria and insurance authorization.    Assistance Recommended at Discharge Intermittent Supervision/Assistance  Patient can return home with the following  A lot of help with walking and/or transfers;A lot of help with bathing/dressing/bathroom;Assistance with cooking/housework;Assist for transportation;Help with stairs or ramp for entrance   Equipment Recommendations  Other (comment)  (defer to next venue of care)    Recommendations for Other Services      Precautions / Restrictions Precautions Precautions: Fall Restrictions Weight Bearing Restrictions: No       Mobility Bed Mobility Overal bed mobility: Needs Assistance Bed Mobility: Supine to Sit, Sit to Supine     Supine to sit: Mod assist, +2 for physical assistance Sit to supine: Mod assist, +2 for physical assistance   General bed mobility comments: strong posterior lean noted throughout with assistance and time able to progress to sitting with CGA and BUE support    Transfers Overall transfer level: Needs assistance Equipment used: Rolling walker (2 wheels) Transfers: Sit to/from Stand, Bed to chair/wheelchair/BSC Sit to Stand: Max assist, +2 physical assistance          Lateral/Scoot Transfers: Max assist, +2 physical assistance General transfer comment: R knee block, limited ability to actually come up into standing, very limited by pain     Balance Overall balance assessment: Needs assistance Sitting-balance support: Feet supported Sitting balance-Leahy Scale: Poor Sitting balance - Comments: progressed to fair with BUE support but able to hold momentarily without RW Postural control: Posterior lean, Left lateral lean Standing balance support: Bilateral upper extremity supported, Reliant on assistive device for balance   Standing balance comment: limited by R knee buckling, constant physical assistance/facilitation, did not come up into true standing                           ADL either performed or assessed with clinical judgement   ADL  Extremity/Trunk Assessment Upper Extremity Assessment Upper Extremity Assessment: Generalized weakness   Lower Extremity Assessment Lower Extremity Assessment: Generalized weakness        Vision Patient Visual Report: No change from baseline            Cognition  Arousal/Alertness: Awake/alert Behavior During Therapy: WFL for tasks assessed/performed Overall Cognitive Status: Within Functional Limits for tasks assessed                                 General Comments: HOH                   Pertinent Vitals/ Pain       Pain Assessment Pain Assessment: 0-10 Pain Score: 8  Pain Location: initially 4/10, increased to 6-8/10 back pain with mobility Pain Descriptors / Indicators: Aching, Discomfort, Grimacing, Guarding Pain Intervention(s): Limited activity within patient's tolerance, Monitored during session, Premedicated before session, Repositioned         Frequency  Min 3X/week        Progress Toward Goals  OT Goals(current goals can now be found in the care plan section)  Progress towards OT goals: Progressing toward goals     Plan Discharge plan remains appropriate;Frequency remains appropriate    Co-evaluation      Reason for Co-Treatment: Necessary to address cognition/behavior during functional activity;For patient/therapist safety;To address functional/ADL transfers PT goals addressed during session: Mobility/safety with mobility;Balance OT goals addressed during session: ADL's and self-care;Proper use of Adaptive equipment and DME      AM-PAC OT "6 Clicks" Daily Activity     Outcome Measure   Help from another person eating meals?: A Little Help from another person taking care of personal grooming?: A Little Help from another person toileting, which includes using toliet, bedpan, or urinal?: Total Help from another person bathing (including washing, rinsing, drying)?: A Lot Help from another person to put on and taking off regular upper body clothing?: A Lot Help from another person to put on and taking off regular lower body clothing?: A Lot 6 Click Score: 13    End of Session Equipment Utilized During Treatment: Rolling walker (2 wheels)  OT Visit Diagnosis: Unsteadiness on feet  (R26.81);Muscle weakness (generalized) (M62.81)   Activity Tolerance Patient tolerated treatment well   Patient Left in bed;with call bell/phone within reach;with bed alarm set   Nurse Communication Mobility status        Time: 7829-5621 OT Time Calculation (min): 23 min  Charges: OT General Charges $OT Visit: 1 Visit OT Treatments $Therapeutic Activity: 8-22 mins  Jackquline Denmark, MS, OTR/L , CBIS ascom [phone]  07/31/22, 1:41 PM

## 2022-07-31 NOTE — Progress Notes (Signed)
Progress Note   Patient: Brett Riley WUJ:[id number] DOB: December 01, 1935 DOA: 07/20/2022     11 DOS: the patient was seen and examined on 07/31/2022   Brief hospital course:  MALACAI OLM is a 87 y.o. Caucasian male with medical history significant for coronary artery disease, hypertension, dyslipidemia and urolithiasis, who presented to the ER with acute onset of bilateral low back pain that started 07/20/2022 in the morning after he got out of bed.  He was barely able to stand per his wife.  He had nausea around  noon. Came to ED.  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 No aneurysm or dissection. 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 Dr Evie Lacks w/ vascular surgery team was consulted by EDP - Since no other explanation for back pain, aortic ulceration is suspected to be the culprit. Dr. Evie Lacks recs hospitalization, pt admitted to hospitalist service.  05/19: Vasc surg planning endovascular intervention if/when cleared per cardiology, recs for cardiology eval and Echo.  05/20: echocardiogram pending - stable LV systolic function and no new wall motion abnormality --> patient will be able to proceed with endovascular repair without further cardiac intervention  05/21: converted into atrial fibrillation with RVR and required diltiazem and metoprolol.  Rate was controlled after this and did not require diltiazem drip. Starting heparin gtt. Await vascular procedure planned for tomorrow  5/21-5/28: Postoperatively patient has been working with physical therapy awaiting placement.      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  Currently on aspirin and Eliquis, high dose statin.  Patient currently has been complaining of significant right knee pain is that of leg pain, obtain x-ray.  Pain medicine changed to Percocet as patient and family does not believe his pain is controlled properly with tramadol.   Atrial fibrillation new onset 05/20 22:58 Heart rate is adequately controlled.  Continue anticoagulation with Eliquis.    Hyponatremia likely secondary to SIADH Patient is currently on sodium chloride 1 g twice a day, started fluid restriction.   Essential hypertension  Dyslipidemia Continue beta-blocker and statin.   Coronary artery disease Continue current treatment.   Hypoxemia early admission - pt is not taking good breaths d/t pain  (+)D-Dimer likely d/t inflammation Condition has resolved.      Subjective:  Patient slept well last night, less confused today. Denies any short of breath.  Physical Exam: Vitals:   07/30/22 2323 07/30/22 2355 07/31/22 0530 07/31/22 0826  BP: (!) 142/64  (!) 142/64 (!) 154/78  Pulse: 85  78 83  Resp: 19 18 16 18   Temp: 98.8 F (37.1 C)  98.5 F (36.9 C) 98.7 F (37.1 C)  TempSrc: Oral     SpO2: 93%  92% 90%  Weight:      Height:       General exam: Appears calm and comfortable  Respiratory system: Clear to auscultation. Respiratory effort normal. Cardiovascular system: S1 & S2 heard, RRR. No JVD, murmurs, rubs, gallops or clicks. No pedal edema. Gastrointestinal system: Abdomen is nondistended, soft and nontender. No organomegaly or masses felt. Normal bowel sounds heard. Central nervous system: Alert and oriented x2. No  focal neurological deficits. Extremities: Right knee mild swelling, no redness.  No significant tenderness. Skin: No rashes, lesions or ulcers Psychiatry:  Mood & affect appropriate.     Data Reviewed:  Lab results reviewed, prior CT scan reviewed.  Family Communication: Wife updated at bedside.  Disposition: Status is: Inpatient Remains inpatient appropriate because: Severity of disease, unsafe discharge, pending nursing placement.     Time spent: 50 minutes  Author: Marrion Coy, MD 07/31/2022 10:16 AM  For on call review [email or web address].

## 2022-07-31 NOTE — Plan of Care (Signed)
  Problem: Education: Goal: Knowledge of General Education information will improve Description: Including pain rating scale, medication(s)/side effects and non-pharmacologic comfort measures Outcome: Progressing   Problem: Clinical Measurements: Goal: Respiratory complications will improve Outcome: Progressing   Problem: Clinical Measurements: Goal: Cardiovascular complication will be avoided Outcome: Progressing   Problem: Elimination: Goal: Will not experience complications related to urinary retention Outcome: Progressing   Problem: Pain Managment: Goal: General experience of comfort will improve Outcome: Progressing   Problem: Safety: Goal: Ability to remain free from injury will improve Outcome: Progressing   

## 2022-07-31 NOTE — Progress Notes (Signed)
Physical Therapy Treatment Patient Details Name: Brett Riley MRN: [id number] DOB: [birth year] Today's Date: 07/31/2022   History of Present Illness 87 y.o. male with a past medical history of coronary artery disease, carotid artery disease status post left carotid endarterectomy, PAD, hypertension, hyperlipidemia, who has been seen and evaluated for cardiac restratification for vascular repair of penetrating aortic ulcer and new onset atrial fibrillation.    PT Comments    Pt alert with family at bedside, agreeable to PT/OT co-treat. Pt mobility still challenged due to increased low back pain and RLE, MD notified and premedicated prior to session. Supine to sit with modA, assistance for BLE and trunk elevation. Varying sitting balance noted but pt predominantly min-modA to maintain sitting due to posterior lean. With extra time, repositioning in midline EOB and BUE propped able to sit with CGA for a bit of time. maxAx2 to laterally scoot into recliner. Standing also limited today due to pain, unable to come up fully into standing despite maxAx2, R knee block and RW. Pt up in recliner with needs in reach at end of session. The patient would benefit from further skilled PT intervention to continue to progress towards goals.     Recommendations for follow up therapy are one component of a multi-disciplinary discharge planning process, led by the attending physician.  Recommendations may be updated based on patient status, additional functional criteria and insurance authorization.  Follow Up Recommendations  Can patient physically be transported by private vehicle: No    Assistance Recommended at Discharge Frequent or constant Supervision/Assistance  Patient can return home with the following Two people to help with walking and/or transfers;Two people to help with bathing/dressing/bathroom;Assistance with cooking/housework;Assist for transportation;Help with stairs or ramp for entrance    Equipment Recommendations  Other (comment) (TBD at next venue of care)    Recommendations for Other Services       Precautions / Restrictions Precautions Precautions: Fall Restrictions Weight Bearing Restrictions: No     Mobility  Bed Mobility Overal bed mobility: Needs Assistance Bed Mobility: Supine to Sit, Sit to Supine     Supine to sit: Mod assist, +2 for physical assistance     General bed mobility comments: strong posterior lean noted throughout with assistance and time able to progress to sitting with CGA and BUE support    Transfers Overall transfer level: Needs assistance Equipment used: Rolling walker (2 wheels) Transfers: Sit to/from Stand, Bed to chair/wheelchair/BSC Sit to Stand: Max assist, +2 physical assistance          Lateral/Scoot Transfers: Max assist, +2 physical assistance General transfer comment: R knee block, limited ability to actually come up into standing, very limited by pain    Ambulation/Gait                   Stairs             Wheelchair Mobility    Modified Rankin (Stroke Patients Only)       Balance Overall balance assessment: Needs assistance Sitting-balance support: Feet supported Sitting balance-Leahy Scale: Poor Sitting balance - Comments: progressed to fair with BUE support but able to hold momentarily without RW Postural control: Posterior lean Standing balance support: Bilateral upper extremity supported, Reliant on assistive device for balance Standing balance-Leahy Scale: Poor Standing balance comment: limited by R knee buckling, constant physical assistance/facilitation, did not come up into true standing  Cognition Arousal/Alertness: Awake/alert Behavior During Therapy: WFL for tasks assessed/performed Overall Cognitive Status: Within Functional Limits for tasks assessed                                          Exercises       General Comments        Pertinent Vitals/Pain Pain Assessment Pain Assessment: 0-10 Pain Score: 8  Pain Location: initially 4/10, increased to 6-8/10 back pain with mobility Pain Descriptors / Indicators: Aching, Discomfort, Grimacing, Guarding Pain Intervention(s): Limited activity within patient's tolerance, Monitored during session, Repositioned, Premedicated before session    Home Living                          Prior Function            PT Goals (current goals can now be found in the care plan section)      Frequency    Min 4X/week      PT Plan Current plan remains appropriate    Co-evaluation PT/OT/SLP Co-Evaluation/Treatment: Yes Reason for Co-Treatment: Necessary to address cognition/behavior during functional activity;For patient/therapist safety;To address functional/ADL transfers PT goals addressed during session: Mobility/safety with mobility;Balance OT goals addressed during session: ADL's and self-care;Proper use of Adaptive equipment and DME      AM-PAC PT "6 Clicks" Mobility   Outcome Measure  Help needed turning from your back to your side while in a flat bed without using bedrails?: A Lot Help needed moving from lying on your back to sitting on the side of a flat bed without using bedrails?: A Lot Help needed moving to and from a bed to a chair (including a wheelchair)?: Total Help needed standing up from a chair using your arms (e.g., wheelchair or bedside chair)?: Total Help needed to walk in hospital room?: Total Help needed climbing 3-5 steps with a railing? : Total 6 Click Score: 8    End of Session Equipment Utilized During Treatment: Gait belt Activity Tolerance: Patient limited by pain Patient left: with chair alarm set;in chair;with call bell/phone within reach Nurse Communication: Mobility status PT Visit Diagnosis: Muscle weakness (generalized) (M62.81);Difficulty in walking, not elsewhere classified (R26.2)     Time:  9166-0600 PT Time Calculation (min) (ACUTE ONLY): 23 min  Charges:  $Therapeutic Activity: 8-22 mins                    Olga Coaster PT, DPT 12:34 PM,07/31/22

## 2022-07-31 NOTE — Progress Notes (Signed)
Inpatient Rehab Admissions Coordinator:   I continue to await insurance auth for CIR. Spoke with Pt.'s wife who states that [facility] is closer for her and that they would prefer it over CIR if [facility] can accept (they have not yet). Explained that if we get auth, we could attempt to transfer it over to [facility] if they can take him. But if they deny, they would not approve other [facility]s, such as [facility].   Megan Salon, MS, CCC-SLP Rehab Admissions Coordinator  [phone] (celll) [phone] (office)

## 2022-08-01 ENCOUNTER — Inpatient Hospital Stay: Payer: Medicare HMO

## 2022-08-01 ENCOUNTER — Telehealth: Payer: Self-pay | Admitting: Internal Medicine

## 2022-08-01 DIAGNOSIS — G9341 Metabolic encephalopathy: Secondary | ICD-10-CM | POA: Diagnosis not present

## 2022-08-01 DIAGNOSIS — N179 Acute kidney failure, unspecified: Secondary | ICD-10-CM | POA: Insufficient documentation

## 2022-08-01 DIAGNOSIS — M25461 Effusion, right knee: Secondary | ICD-10-CM | POA: Insufficient documentation

## 2022-08-01 DIAGNOSIS — I719 Aortic aneurysm of unspecified site, without rupture: Secondary | ICD-10-CM | POA: Diagnosis not present

## 2022-08-01 DIAGNOSIS — E871 Hypo-osmolality and hyponatremia: Secondary | ICD-10-CM | POA: Diagnosis not present

## 2022-08-01 LAB — URINALYSIS, COMPLETE (UACMP) WITH MICROSCOPIC
Bilirubin Urine: NEGATIVE
Glucose, UA: NEGATIVE mg/dL
Ketones, ur: NEGATIVE mg/dL
Leukocytes,Ua: NEGATIVE
Nitrite: NEGATIVE
Protein, ur: 100 mg/dL — AB
RBC / HPF: >50 RBC/hpf (ref 0–5)
Specific Gravity, Urine: 1.013 (ref 1.005–1.030)
Squamous Epithelial / HPF: NONE SEEN /HPF (ref 0–5)
pH: 5 (ref 5.0–8.0)

## 2022-08-01 LAB — BLOOD CULTURE ID PANEL (REFLEXED) - BCID2

## 2022-08-01 LAB — CBC
HCT: 31.7 % — ABNORMAL LOW (ref 39.0–52.0)
Hemoglobin: 10.9 g/dL — ABNORMAL LOW (ref 13.0–17.0)
MCH: 32.1 pg (ref 26.0–34.0)
MCHC: 34.4 g/dL (ref 30.0–36.0)
MCV: 93.2 fL (ref 80.0–100.0)
Platelets: 226 10*3/uL (ref 150–400)
RBC: 3.4 MIL/uL — ABNORMAL LOW (ref 4.22–5.81)
RDW: 13.8 % (ref 11.5–15.5)
WBC: 13 10*3/uL — ABNORMAL HIGH (ref 4.0–10.5)
nRBC: 0 % (ref 0.0–0.2)

## 2022-08-01 LAB — SURGICAL PCR SCREEN
MRSA, PCR: NEGATIVE
Staphylococcus aureus: POSITIVE — AB

## 2022-08-01 LAB — SARS CORONAVIRUS 2 BY RT PCR: SARS Coronavirus 2 by RT PCR: NEGATIVE

## 2022-08-01 LAB — PROCALCITONIN: Procalcitonin: 0.92 ng/mL

## 2022-08-01 LAB — BASIC METABOLIC PANEL WITH GFR
Anion gap: 10 (ref 5–15)
BUN: 59 mg/dL — ABNORMAL HIGH (ref 8–23)
CO2: 22 mmol/L (ref 22–32)
Calcium: 8.1 mg/dL — ABNORMAL LOW (ref 8.9–10.3)
Chloride: 98 mmol/L (ref 98–111)
Creatinine, Ser: 2.09 mg/dL — ABNORMAL HIGH (ref 0.61–1.24)
GFR, Estimated: 31 mL/min — ABNORMAL LOW
Glucose, Bld: 107 mg/dL — ABNORMAL HIGH (ref 70–99)
Potassium: 4.8 mmol/L (ref 3.5–5.1)
Sodium: 130 mmol/L — ABNORMAL LOW (ref 135–145)

## 2022-08-01 LAB — MAGNESIUM: Magnesium: 2.6 mg/dL — ABNORMAL HIGH (ref 1.7–2.4)

## 2022-08-01 LAB — LACTIC ACID, PLASMA
Lactic Acid, Venous: 1.9 mmol/L (ref 0.5–1.9)
Lactic Acid, Venous: 1.1 mmol/L (ref 0.5–1.9)

## 2022-08-01 MED ORDER — MUPIROCIN 2 % EX OINT
1.0000 | TOPICAL_OINTMENT | Freq: Two times a day (BID) | CUTANEOUS | Status: AC
  Administered 2022-08-01 – 2022-08-06 (×10): 1 via NASAL
  Filled 2022-08-01: qty 22

## 2022-08-01 MED ORDER — LIDOCAINE 5 % EX PTCH
1.0000 | MEDICATED_PATCH | CUTANEOUS | Status: DC
  Administered 2022-08-01 – 2022-08-09 (×9): 1 via TRANSDERMAL
  Filled 2022-08-01 (×9): qty 1

## 2022-08-01 MED ORDER — SODIUM CHLORIDE 0.9 % IV SOLN
2.0000 g | INTRAVENOUS | Status: DC
  Administered 2022-08-01: 2 g via INTRAVENOUS
  Filled 2022-08-01 (×2): qty 12.5

## 2022-08-01 MED ORDER — LACTULOSE 10 GM/15ML PO SOLN
20.0000 g | Freq: Once | ORAL | Status: AC
  Administered 2022-08-02: 20 g via ORAL
  Filled 2022-08-01 (×2): qty 30

## 2022-08-01 MED ORDER — VANCOMYCIN HCL 1750 MG/350ML IV SOLN
1750.0000 mg | Freq: Once | INTRAVENOUS | Status: AC
  Administered 2022-08-01: 1750 mg via INTRAVENOUS
  Filled 2022-08-01: qty 350

## 2022-08-01 MED ORDER — VANCOMYCIN VARIABLE DOSE PER UNSTABLE RENAL FUNCTION (PHARMACIST DOSING): Status: DC

## 2022-08-01 MED ORDER — SENNOSIDES-DOCUSATE SODIUM 8.6-50 MG PO TABS
2.0000 | ORAL_TABLET | Freq: Two times a day (BID) | ORAL | Status: DC
  Administered 2022-08-02 – 2022-08-09 (×15): 2 via ORAL
  Filled 2022-08-01 (×15): qty 2

## 2022-08-01 MED ORDER — SODIUM CHLORIDE 0.9 % IV SOLN: INTRAVENOUS | Status: DC

## 2022-08-01 NOTE — Progress Notes (Signed)
[facility] IP rehab admission - Insurance carrier has denied an acute inpatient rehab admission.  However, attending MD can call and do a peer to peer review by 12 noon today # [phone], option 3 to schedule the review.  MD will need ID #[id number] and DOB 30-Aug-1935 and patient name.  Call me for questions.  [phone]

## 2022-08-01 NOTE — Telephone Encounter (Signed)
FYI for you- Patients wife called to [place] know that patient has been in the hospital for 12 days and is not improving. Not eating, sedated all the time, etc. Advised that while patient is admitted to the hospital he is under the hospitalists care and she needs to voice her concerns to the staff giving him care and discuss when the doctor when they round. Wife is waiting to meet with the doctor this morning. Advised to keep [place] posted.

## 2022-08-01 NOTE — Telephone Encounter (Signed)
Pt wife called in stating that she would like to speak to Dr. Lorin Picket or Bethann Berkshire concerning husband, she said its urgent he's in the hospital.

## 2022-08-01 NOTE — TOC Progression Note (Signed)
Transition of Care [facility]) - Progression Note    Patient Details  Name: Brett Riley MRN: [id number] Date of Birth: Sep 09, 1935  Transition of Care [facility]) CM/SW Contact  Margarito Liner, LCSW Phone Number: 08/01/2022, 9:41 AM  Clinical Narrative:   No call back yet from [facility]. CSW called and left another voicemail.  Expected Discharge Plan and Services                                               Social Determinants of Health (SDOH) Interventions SDOH Screenings   Food Insecurity: No Food Insecurity (07/20/2022)  Housing: Low Risk  (07/20/2022)  Transportation Needs: No Transportation Needs (07/20/2022)  Utilities: Not At Risk (07/20/2022)  Depression (PHQ2-9): Low Risk  (06/06/2022)  Financial Resource Strain: Low Risk  (08/29/2020)  Physical Activity: Insufficiently Active (08/29/2020)  Social Connections: Unknown (08/29/2020)  Stress: No Stress Concern Present (08/29/2020)  Tobacco Use: Low Risk  (07/30/2022)    Readmission Risk Interventions     No data to display

## 2022-08-01 NOTE — Consult Note (Signed)
Pharmacy Antibiotic Note  Brett Riley is a 87 y.o. 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 Pharmacy has been consulted for vancomycin and cefepime dosing.  Plan:  Cefepime 2 g IV q24h  Vancomycin 1.75 g IV loading dose x 1 --Dose per levels for now given AKI, will assess renal function tomorrow and decide on timing of level versus starting maintenance regimen --Daily Scr per protocol  Height: 6' (182.9 cm) Weight: 89.9 kg (198 lb 3.1 oz) IBW/kg (Calculated) : 77.6  Temp (24hrs), Avg:99.8 F (37.7 C), Min:98.1 F (36.7 C), Max:102.7 F (39.3 C)  Recent Labs  Lab 07/27/22 0626 07/28/22 0424 07/29/22 0412 07/30/22 0415 07/31/22 0403 08/01/22 0857  WBC 10.1  --  14.8* 13.5* 12.9* 13.0*  CREATININE 0.92 0.94 0.98 1.04 1.15 2.09*    Estimated Creatinine Clearance: 28.9 mL/min (A) (by C-G formula based on SCr of 2.09 mg/dL (H)).    No Known Allergies  Antimicrobials this admission: Cefazolin 5/22 x 1 (surgical ppx) Vancomycin 5/30 >>  Cefepime 5/30 >>   Dose adjustments this admission: N/A  Microbiology results: 5/30 BCx: pending  Thank you for allowing pharmacy to be a part of this patient's care.  [name] 08/01/2022 12:08 PM

## 2022-08-01 NOTE — Progress Notes (Addendum)
Patient does exhibit signs of hypocalcemia: - twitching of the hands - memory loss at times (minor) - denies any tingling or numbness  Hyponatremia S/S Lethargic and drowsiness Will not medicate with any narcotics  Pt is very lethargic and unable to keep his eyes open at times Pt reports having very dry mouth Pt is on a 1500 ml fluid restriction   This shift thus far pt received 240 mL for breakfast.  Lab tech returned to draw BMP and Magnesium.  Pt can chew his food but appears he is having trouble swallowing at this time.  Cardiac: BNP 329.1 Heart sounds S1 S2 Lung sounds clear, diminished  GU Urine  amber and foamy as suspected

## 2022-08-01 NOTE — Consult Note (Signed)
ORTHOPAEDIC CONSULTATION  REQUESTING PHYSICIAN: Marrion Coy, MD  Chief Complaint:   Right knee pain  History of Present Illness: History obtained from review of the medical record, discussion with the patient and his wife at the bedside, and discussion with medical providers.  Brett Riley is a 87 y.o. male who has been admitted since 07/20/2022 and underwent vascular surgery for penetrating aortic ulcer on 07/24/2022.  He developed a knee joint effusion 1-2 days ago with increased right knee pain as evidenced with attempted mobilization.  Sensation of weakness and right knee dragging was felt.  Radiographs were obtained, and this showed mild degenerative changes with a knee joint effusion.  He did not have any fevers or signs of infection until this morning when he spiked a fever to 102 F.  He does not have a history of significant previous knee pain.   Past Medical History:  Diagnosis Date   Cancer [facility])    skin cancer basal   Carotid arterial disease (HCC)    a. 02/2016 L CEA; b. 01/2017 U/S: patent LICA, 1-39% RICA.   Celiac artery stenosis (HCC)    Coronary artery disease    a. 01/2016 MV: mild apical/basal inferior, apical lateral, mid anterolateral, and mid inferolateral ischemia. EF 57%; b. 02/2016 Cath: LM 40ost, LAD 70p/m, 20d, D1 95 (small), LCX nl, RCA 78m, RPDA 90 (small), EF 55-65%-->med Rx. Rec CABG for recurrent symptoms.   Hearing loss    History of echocardiogram    a. 02/2016 Echo: EF 50-55%, no rwma, mild MR.   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   Osteoarthritis, hip, bilateral    Left > Right   Past Surgical History:  Procedure Laterality Date   AORTIC INTERVENTION N/A 07/24/2022   Procedure: AORTIC INTERVENTION;  Surgeon: Renford Dills, MD;  Location: [facility] INVASIVE CV LAB;  Service: Cardiovascular;  Laterality:  N/A;   CARDIAC CATHETERIZATION N/A 01/19/2016   Procedure: Left Heart Cath and Coronary Angiography;  Surgeon: Iran Ouch, MD;  Location: [facility] INVASIVE CV LAB;  Service: Cardiovascular;  Laterality: N/A;   COLONOSCOPY     CYSTOSCOPY/URETEROSCOPY/HOLMIUM LASER/STENT PLACEMENT Right 04/13/2021   Procedure: CYSTOSCOPY/URETEROSCOPY/HOLMIUM LASER/STENT PLACEMENT;  Surgeon: Sondra Come, MD;  Location: [facility] ORS;  Service: Urology;  Laterality: Right;   ENDARTERECTOMY Left 02/15/2016   Procedure: ENDARTERECTOMY CAROTID;  Surgeon: Annice Needy, MD;  Location: [facility] ORS;  Service: Vascular;  Laterality: Left;   Social History   Socioeconomic History   Marital status: Married    Spouse name: Not on file   Number of children: 1   Years of education: Not on file   Highest education level: Not on file  Occupational History    Employer: [employer]  Tobacco Use   Smoking status: Never   Smokeless tobacco: Never  Vaping Use   Vaping Use: Never used  Substance and Sexual Activity   Alcohol use: No    Alcohol/week: 0.0 standard drinks of alcohol   Drug use: No   Sexual activity: Not Currently  Other Topics Concern   Not on file  Social History Narrative   Not on file   Social Determinants of Health   Financial Resource Strain: Low Risk  (08/29/2020)   Overall Financial Resource Strain (CARDIA)    Difficulty of Paying Living Expenses: Not hard at all  Food Insecurity: No Food Insecurity (07/20/2022)   Hunger  Vital Sign    Worried About Programme researcher, broadcasting/film/video in the Last Year: Never true    Ran Out of Food in the Last Year: Never true  Transportation Needs: No Transportation Needs (07/20/2022)   PRAPARE - Administrator, Civil Service (Medical): No    Lack of Transportation (Non-Medical): No  Physical Activity: Insufficiently Active (08/29/2020)   Exercise Vital Sign    Days of Exercise per Week: 3 days    Minutes of Exercise per Session: 20 min  Stress: No Stress Concern  Present (08/29/2020)   [employer] of Occupational Health - Occupational Stress Questionnaire    Feeling of Stress : Not at all  Social Connections: Unknown (08/29/2020)   Social Connection and Isolation Panel [NHANES]    Frequency of Communication with Friends and Family: Not on file    Frequency of Social Gatherings with Friends and Family: Not on file    Attends Religious Services: Not on file    Active Member of Clubs or Organizations: Not on file    Attends Banker Meetings: Not on file    Marital Status: Married   Family History  Problem Relation Age of Onset   Stroke Mother    Heart disease Father        MI   Heart attack Father    Breast cancer Sister    Colon cancer Neg Hx    Prostate cancer Neg Hx    No Known Allergies Prior to Admission medications   Medication Sig Start Date End Date Taking? Authorizing Provider  amLODipine (NORVASC) 5 MG tablet Take 1 tablet (5 mg total) by mouth daily. 04/05/22  Yes Dale Gassaway, MD  clopidogrel (PLAVIX) 75 MG tablet TAKE 1 TABLET BY MOUTH EVERY DAY WITH BREAKFAST 02/14/22  Yes Dale [place], MD  lisinopril (ZESTRIL) 40 MG tablet Take 1 tablet (40 mg total) by mouth daily. 02/14/22  Yes Dale Blackburn, MD  metoprolol succinate (TOPROL-XL) 100 MG 24 hr tablet TAKE 1 TABLET BY MOUTH EVERY DAY WITH A MEAL 11/21/21  Yes Dale [place], MD  Multiple Vitamins-Iron (MULTIVITAMINS WITH IRON) TABS Take 1 tablet by mouth daily.   Yes [provider]  rosuvastatin (CRESTOR) 10 MG tablet Take 1 tablet (10 mg total) by mouth daily. 02/14/22  Yes Dale , MD  vitamin B-12 (CYANOCOBALAMIN) 1000 MCG tablet Take 1,000 mcg by mouth daily.   Yes [provider]  aspirin 81 MG tablet Take 81 mg by mouth at bedtime.     [provider]   Recent Labs    07/30/22 0415 07/31/22 0403 08/01/22 0857  WBC 13.5* 12.9* 13.0*  HGB 12.7* 11.2* 10.9*  HCT 36.0* 32.9* 31.7*  PLT 237 220 226  K 4.6 4.7 4.8   CL 97* 97* 98  CO2 24 22 22   BUN 35* 36* 59*  CREATININE 1.04 1.15 2.09*  GLUCOSE 116* 101* 107*  CALCIUM 8.6* 8.2* 8.1*   DG Chest Port 1 View  Result Date: 08/01/2022 CLINICAL DATA:  Fever. EXAM: PORTABLE CHEST 1 VIEW COMPARISON:  Jul 27, 2022. FINDINGS: Stable cardiomediastinal silhouette. Stable bibasilar atelectasis or infiltrates are noted, right greater than left. Bony thorax is unremarkable. IMPRESSION: Stable bibasilar opacities as described above. Electronically Signed   By: Lupita Raider M.D.   On: 08/01/2022 14:28   MR BRAIN WO CONTRAST  Result Date: 08/01/2022 EXAM: MRI HEAD WITHOUT CONTRAST TECHNIQUE: Multiplanar, multiecho pulse sequences of the brain and surrounding structures were obtained without  intravenous contrast. COMPARISON:  MRI of the brain December 26, 2020. FINDINGS: Brain: No acute infarction, hemorrhage, hydrocephalus, extra-axial collection or mass lesion. Scattered and confluent foci T2 air seen within the white matter of the cerebral hemispheres, nonspecific, most likely related to chronic small vessel ischemia, not significantly changed compared to prior MRI. Mild parenchymal volume loss. Vascular: Normal flow voids. Skull and upper cervical spine: Normal marrow signal. Sinuses/Orbits: Bilateral mastoid effusion. Paranasal sinuses are clear. The orbits are maintained. Other: None. IMPRESSION: 1. No acute intracranial abnormality. 2. Moderate chronic microvascular ischemic changes of the white matter, stable. Electronically Signed   By: Baldemar Lenis M.D.   On: 08/01/2022 13:15   DG Knee 1-2 Views Right  Result Date: 07/31/2022 CLINICAL DATA:  Right knee pain. EXAM: RIGHT KNEE - 1-2 VIEW COMPARISON:  None Available. FINDINGS: The mineralization and alignment are normal. There is no evidence of acute fracture or dislocation. Minimal medial compartment joint space narrowing. There is a moderate size knee joint effusion. Prominent vascular  calcifications are noted. No evidence of foreign body. IMPRESSION: Moderate-sized knee joint effusion with minimal medial compartment joint space narrowing. No acute osseous findings. Electronically Signed   By: Carey Bullocks M.D.   On: 07/31/2022 19:07     Positive ROS: All other systems have been reviewed and were otherwise negative with the exception of those mentioned in the HPI and as above.  Physical Exam: BP 108/65   Pulse 92   Temp (!) 101.4 F (38.6 C)   Resp 14   Ht 6' (1.829 m)   Wt 89.9 kg   SpO2 94%   BMI 26.88 kg/m  General: Drowsy, no acute distress   Orthopedic Exam:  RLE: +DF/PF/EHL SILT grossly intact over foot Foot wwp RoM Knee: able to passively range knee from 0-90 degrees with minimal pain No significant erythema or warmth about the knee. 1+ joint effusion is present   X-rays:  As above: Nonweightbearing views show mild medial compartment degenerative changes.  No fractures or dislocations.  Knee effusion present.  Assessment/Plan: 87 year old male with right knee pain. 1.  We discussed the possible diagnoses as well as various treatment options.  Given his current exam, there is very low suspicion of septic joint or inflammatory process such as gout/pseudogout.  This likely appears to be related to underlying degenerative changes to the knee.  We will plan to defer joint aspiration at this time  2.  Treatment options for this would include medical management with Tylenol/NSAIDs and continued physical therapy as tolerated.  We did discuss that a corticosteroid injection could be of benefit in the future, but given current concern for infectious process with fever, we agreed to defer corticosteroid injection.  3.  Will plan to follow patient peripherally.  Please page with any further questions or significant change in the exam.    Signa Kell   08/01/2022 2:57 PM

## 2022-08-01 NOTE — Progress Notes (Signed)
Peer to peer review for rehab. Discussed with insurance, based on current information, they will not approved for acute rehab.  Patient currently is not ready for discharge, we can resubmit for approval after he is medically ready.  But based on current information, they believe patient is more appropriate for SNF rehab.

## 2022-08-01 NOTE — Progress Notes (Addendum)
Progress Note   Patient: Brett Riley:[id number] DOB: 01/07/1936 DOA: 07/20/2022     12 DOS: the patient was seen and examined on 08/01/2022   Brief hospital course:  Brett Riley is a 87 y.o. Caucasian male with medical history significant for coronary artery disease, hypertension, dyslipidemia and urolithiasis, who presented to the ER with acute onset of bilateral low back pain that started 07/20/2022 in the morning after he got out of bed.  He was barely able to stand per his wife.  He had nausea around  noon. Came to ED.  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 No aneurysm or dissection. 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 Dr Evie Lacks w/ vascular surgery team was consulted by EDP - Since no other explanation for back pain, aortic ulceration is suspected to be the culprit. Dr. Evie Lacks recs hospitalization, pt admitted to hospitalist service.  05/19: Vasc surg planning endovascular intervention if/when cleared per cardiology, recs for cardiology eval and Echo.  05/20: echocardiogram pending - stable LV systolic function and no new wall motion abnormality --> patient will be able to proceed with endovascular repair without further cardiac intervention  05/21: converted into atrial fibrillation with RVR and required diltiazem and metoprolol.  Rate was controlled after this and did not require diltiazem drip. Starting heparin gtt. Await vascular procedure planned for tomorrow  5/21-5/28: Postoperatively patient has been working with physical therapy 5/30. Patient had increased weakness, poor appetite.  Right knee effusion.     Principal Problem:   Penetrating atherosclerotic ulcer of aorta (HCC) Active Problems:   Uncontrolled hypertension   Coronary artery disease   Hypoxemia   Thrombocytopenia (HCC)   Preop cardiovascular exam   Atherosclerotic  ulcer of aorta (HCC)   Dyslipidemia   PVD (peripheral vascular disease) (HCC)   Atrial fibrillation with rapid ventricular response (HCC)   Persistent atrial fibrillation (HCC)   Hyponatremia   Overweight (BMI 25.0-29.9)   Assessment and Plan:  Acute kidney injury. Hyponatremia likely secondary to SIADH Patient has been sleepy since yesterday, has reduced the p.o. intake.  Renal function is worse today, patient appears to be dehydrated.  Will start IV fluids with normal saline.  Sodium level gradually improving.  Right knee effusion with arthritis. Patient had increased knee pain yesterday, was started on pain medicine.  X-ray showed moderate joint effusion.  Orthopedic consult is obtained for knee tap with possible injection of steroids.  Metabolic encephalopathy. Generalized weakness. Had an increased weakness since yesterday, is very sleepy.  Poor p.o. intake. This could be secondary to taking pain medicine, I will discontinue pain medicine, will use Lidoderm patch and as needed Tylenol. Continue PT/OT. Patient also complaining of dysphagia, given leg weakness, I will obtain MRI to rule out stroke. N.p.o. until evaluated by speech therapy.   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  Currently on aspirin and Eliquis, high dose statin.  Patient also complaining of back pain, will avoid additional pain medicine.  Will give Lidoderm patch as needed.    Atrial fibrillation new onset 05/20 22:58 Heart rate is adequately controlled.  Continue anticoagulation with Eliquis.   Essential hypertension  Dyslipidemia Continue beta-blocker and statin.   Coronary artery disease Continue current treatment.   Hypoxemia early admission - pt is not taking good breaths  d/t pain  (+)D-Dimer likely d/t inflammation Condition has resolved.   Addendum: 1150.  Patient spiked a high fever,  will obtain blood cultures.  Obtain knee tap in the right, start antibiotics with cefepime and vancomycin after knee tap.  Check a UA and chest x-ray.  Subjective:  Patient become very sleepy, poor appetite.  Not had a bowel movement for a few days.  Physical Exam: Vitals:   07/31/22 1959 07/31/22 2350 08/01/22 0556 08/01/22 0750  BP: (!) 153/70 (!) 150/68 (!) 143/66 126/63  Pulse: 82 82 76 79  Resp: 17 17 17 18   Temp: 98.8 F (37.1 C) 98.6 F (37 C) 98.1 F (36.7 C) 98.6 F (37 C)  TempSrc: Oral Oral    SpO2: 97% 97% 97% 94%  Weight:      Height:       General exam: Appears calm and comfortable  Respiratory system: Clear to auscultation. Respiratory effort normal. Cardiovascular system: S1 & S2 heard, RRR. No JVD, murmurs, rubs, gallops or clicks. No pedal edema. Gastrointestinal system: Abdomen is nondistended, soft and nontender. No organomegaly or masses felt. Normal bowel sounds heard. Central nervous system: Drowsy oriented x 2, right leg weakness. Extremities: Symmetric 5 x 5 power. Skin: No rashes, lesions or ulcers Psychiatry: Mood & affect appropriate.    Data Reviewed:  Reviewed lab results.  Family Communication: Wife updated at bedside.  Disposition: Status is: Inpatient Remains inpatient appropriate because: Severity of disease, altered mental status, IV fluids. Peer to peer review will be obtained today for acute rehab.     Time spent: 50 minutes  Author: Marrion Coy, MD 08/01/2022 11:15 AM  For on call review [email or web address].

## 2022-08-02 ENCOUNTER — Encounter: Payer: Self-pay | Admitting: Radiology

## 2022-08-02 ENCOUNTER — Inpatient Hospital Stay: Payer: Medicare HMO

## 2022-08-02 DIAGNOSIS — N179 Acute kidney failure, unspecified: Secondary | ICD-10-CM

## 2022-08-02 DIAGNOSIS — B9561 Methicillin susceptible Staphylococcus aureus infection as the cause of diseases classified elsewhere: Secondary | ICD-10-CM | POA: Diagnosis not present

## 2022-08-02 DIAGNOSIS — A4101 Sepsis due to Methicillin susceptible Staphylococcus aureus: Secondary | ICD-10-CM | POA: Insufficient documentation

## 2022-08-02 DIAGNOSIS — I719 Aortic aneurysm of unspecified site, without rupture: Secondary | ICD-10-CM | POA: Diagnosis not present

## 2022-08-02 DIAGNOSIS — R7881 Bacteremia: Secondary | ICD-10-CM

## 2022-08-02 DIAGNOSIS — G9341 Metabolic encephalopathy: Secondary | ICD-10-CM

## 2022-08-02 LAB — BASIC METABOLIC PANEL WITH GFR
Anion gap: 8 (ref 5–15)
BUN: 79 mg/dL — ABNORMAL HIGH (ref 8–23)
CO2: 21 mmol/L — ABNORMAL LOW (ref 22–32)
Calcium: 7.9 mg/dL — ABNORMAL LOW (ref 8.9–10.3)
Chloride: 101 mmol/L (ref 98–111)
Creatinine, Ser: 2.62 mg/dL — ABNORMAL HIGH (ref 0.61–1.24)
GFR, Estimated: 23 mL/min — ABNORMAL LOW
Glucose, Bld: 115 mg/dL — ABNORMAL HIGH (ref 70–99)
Potassium: 4.3 mmol/L (ref 3.5–5.1)
Sodium: 130 mmol/L — ABNORMAL LOW (ref 135–145)

## 2022-08-02 LAB — CBC
HCT: 30 % — ABNORMAL LOW (ref 39.0–52.0)
Hemoglobin: 10.3 g/dL — ABNORMAL LOW (ref 13.0–17.0)
MCH: 31.7 pg (ref 26.0–34.0)
MCHC: 34.3 g/dL (ref 30.0–36.0)
MCV: 92.3 fL (ref 80.0–100.0)
Platelets: 209 10*3/uL (ref 150–400)
RBC: 3.25 MIL/uL — ABNORMAL LOW (ref 4.22–5.81)
RDW: 13.8 % (ref 11.5–15.5)
WBC: 16.1 10*3/uL — ABNORMAL HIGH (ref 4.0–10.5)
nRBC: 0 % (ref 0.0–0.2)

## 2022-08-02 LAB — MAGNESIUM: Magnesium: 2.6 mg/dL — ABNORMAL HIGH (ref 1.7–2.4)

## 2022-08-02 MED ORDER — PANTOPRAZOLE SODIUM 40 MG PO TBEC
40.0000 mg | DELAYED_RELEASE_TABLET | Freq: Every day | ORAL | Status: DC
  Administered 2022-08-02 – 2022-08-09 (×8): 40 mg via ORAL
  Filled 2022-08-02 (×9): qty 1

## 2022-08-02 MED ORDER — ENSURE ENLIVE PO LIQD
237.0000 mL | Freq: Two times a day (BID) | ORAL | Status: DC
  Administered 2022-08-02 – 2022-08-07 (×7): 237 mL via ORAL

## 2022-08-02 MED ORDER — LIDOCAINE 5 % EX PTCH
1.0000 | MEDICATED_PATCH | CUTANEOUS | Status: DC
  Administered 2022-08-02 – 2022-08-08 (×7): 1 via TRANSDERMAL
  Filled 2022-08-02 (×7): qty 1

## 2022-08-02 MED ORDER — CEFAZOLIN SODIUM-DEXTROSE 2-4 GM/100ML-% IV SOLN
2.0000 g | Freq: Two times a day (BID) | INTRAVENOUS | Status: DC
  Administered 2022-08-02 – 2022-08-04 (×6): 2 g via INTRAVENOUS
  Filled 2022-08-02 (×7): qty 100

## 2022-08-02 MED ORDER — GADOBUTROL 1 MMOL/ML IV SOLN
9.0000 mL | Freq: Once | INTRAVENOUS | Status: AC | PRN
  Administered 2022-08-02: 9 mL via INTRAVENOUS

## 2022-08-02 MED ORDER — APIXABAN 2.5 MG PO TABS
2.5000 mg | ORAL_TABLET | Freq: Two times a day (BID) | ORAL | Status: DC
  Administered 2022-08-02 – 2022-08-09 (×15): 2.5 mg via ORAL
  Filled 2022-08-02 (×15): qty 1

## 2022-08-02 MED ORDER — LACTULOSE 10 GM/15ML PO SOLN
20.0000 g | Freq: Once | ORAL | Status: AC
  Administered 2022-08-02: 20 g via ORAL
  Filled 2022-08-02: qty 30

## 2022-08-02 NOTE — Progress Notes (Signed)
   [facility] has been requested to perform a transesophageal echocardiogram on Brett Riley for bacteremia.  After careful review of history and examination, the risks and benefits of transesophageal echocardiogram have been explained including risks of esophageal damage, perforation (1:10,000 risk), bleeding, pharyngeal hematoma as well as other potential complications associated with conscious sedation including aspiration, arrhythmia, respiratory failure and death. Alternatives to treatment were discussed, questions were answered. Patient is willing to proceed.   Angelis Gates David Stall, PA-C  08/02/2022 1:53 PM

## 2022-08-02 NOTE — Progress Notes (Signed)
PHARMACY - PHYSICIAN COMMUNICATION CRITICAL VALUE ALERT - BLOOD CULTURE IDENTIFICATION (BCID)  Brett Riley is an 87 y.o. male who presented to [facility] on 07/20/2022 with a chief complaint of penetrating atherosclerotic ulcer of the aorta s/p Endologix aortic endoprosthesis on 5/22  Assessment: 4/4 bottles GPC. BCID detected MSSA. Source could be right knee or secondary to vascular intervention  Name of physician (or Provider) Contacted: Dr. Chipper Herb  Current antibiotics: Vancomycin + cefepime  Changes to prescribed antibiotics recommended:  Narrow to cefazolin. ID automatically consulted  Results for orders placed or performed during the hospital encounter of 07/20/22  Blood Culture ID Panel (Reflexed) (Collected: 08/01/2022  1:04 PM)  Result Value Ref Range   Enterococcus faecalis NOT DETECTED NOT DETECTED   Enterococcus Faecium NOT DETECTED NOT DETECTED   Listeria monocytogenes NOT DETECTED NOT DETECTED   Staphylococcus species DETECTED (A) NOT DETECTED   Staphylococcus aureus (BCID) DETECTED (A) NOT DETECTED   Staphylococcus epidermidis NOT DETECTED NOT DETECTED   Staphylococcus lugdunensis NOT DETECTED NOT DETECTED   Streptococcus species NOT DETECTED NOT DETECTED   Streptococcus agalactiae NOT DETECTED NOT DETECTED   Streptococcus pneumoniae NOT DETECTED NOT DETECTED   Streptococcus pyogenes NOT DETECTED NOT DETECTED   A.calcoaceticus-baumannii NOT DETECTED NOT DETECTED   Bacteroides fragilis NOT DETECTED NOT DETECTED   Enterobacterales NOT DETECTED NOT DETECTED   Enterobacter cloacae complex NOT DETECTED NOT DETECTED   Escherichia coli NOT DETECTED NOT DETECTED   Klebsiella aerogenes NOT DETECTED NOT DETECTED   Klebsiella oxytoca NOT DETECTED NOT DETECTED   Klebsiella pneumoniae NOT DETECTED NOT DETECTED   Proteus species NOT DETECTED NOT DETECTED   Salmonella species NOT DETECTED NOT DETECTED   Serratia marcescens NOT DETECTED NOT DETECTED   Haemophilus influenzae NOT  DETECTED NOT DETECTED   Neisseria meningitidis NOT DETECTED NOT DETECTED   Pseudomonas aeruginosa NOT DETECTED NOT DETECTED   Stenotrophomonas maltophilia NOT DETECTED NOT DETECTED   Candida albicans NOT DETECTED NOT DETECTED   Candida auris NOT DETECTED NOT DETECTED   Candida glabrata NOT DETECTED NOT DETECTED   Candida krusei NOT DETECTED NOT DETECTED   Candida parapsilosis NOT DETECTED NOT DETECTED   Candida tropicalis NOT DETECTED NOT DETECTED   Cryptococcus neoformans/gattii NOT DETECTED NOT DETECTED   Meth resistant mecA/C and MREJ NOT DETECTED NOT DETECTED    [name] 08/02/2022  7:33 AM

## 2022-08-02 NOTE — Consult Note (Signed)
Physical Medicine and Rehabilitation Consult Reason for Consult: Assess candidacy for CIR Referring Physician: Marrion Coy, MD   HPI: Brett Riley is a 87 y.o. male with a PMH of CAD, HTN, dyslipidemia, ant urolithiasis, who presented to the ED with an acute onset of bilateral low back pain that started 07/20/22 in the morning after he got out of bed. He was barely able to stand per his wife. 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 There was no aneurysm or dissection. There was advanced narrowing at the origins of the celiac axis, SMA, and bilateral renal arteries. He had a AAA repair on 5/22. Postoperatively he became septic with a high fever, tachycardia, and altered mental status on 5/30. Cefepime and Vanc were started and blood cultures showed MSSA. Physical Medicine & Rehabilitation was consulted to assess candidacy for CIR.     ROS +weakness Past Medical History:  Diagnosis Date   Cancer (HCC)    skin cancer basal   Carotid arterial disease (HCC)    a. 02/2016 L CEA; b. 01/2017 U/S: patent LICA, 1-39% RICA.   Celiac artery stenosis (HCC)    Coronary artery disease    a. 01/2016 MV: mild apical/basal inferior, apical lateral, mid anterolateral, and mid inferolateral ischemia. EF 57%; b. 02/2016 Cath: LM 40ost, LAD 70p/m, 20d, D1 95 (small), LCX nl, RCA 1m, RPDA 90 (small), EF 55-65%-->med Rx. Rec CABG for recurrent symptoms.   Hearing loss    History of echocardiogram    a. 02/2016 Echo: EF 50-55%, no rwma, mild MR.   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   Osteoarthritis, hip, bilateral    Left > Right   Past Surgical History:  Procedure Laterality Date   AORTIC INTERVENTION N/A 07/24/2022   Procedure: AORTIC INTERVENTION;  Surgeon: Renford Dills, MD;  Location: [facility] INVASIVE  CV LAB;  Service: Cardiovascular;  Laterality: N/A;   CARDIAC CATHETERIZATION N/A 01/19/2016   Procedure: Left Heart Cath and Coronary Angiography;  Surgeon: Iran Ouch, MD;  Location: [facility] INVASIVE CV LAB;  Service: Cardiovascular;  Laterality: N/A;   COLONOSCOPY     CYSTOSCOPY/URETEROSCOPY/HOLMIUM LASER/STENT PLACEMENT Right 04/13/2021   Procedure: CYSTOSCOPY/URETEROSCOPY/HOLMIUM LASER/STENT PLACEMENT;  Surgeon: Sondra Come, MD;  Location: [facility] ORS;  Service: Urology;  Laterality: Right;   ENDARTERECTOMY Left 02/15/2016   Procedure: ENDARTERECTOMY CAROTID;  Surgeon: Annice Needy, MD;  Location: [facility] ORS;  Service: Vascular;  Laterality: Left;   Family History  Problem Relation Age of Onset   Stroke Mother    Heart disease Father        MI   Heart attack Father    Breast cancer Sister    Colon cancer Neg Hx    Prostate cancer Neg Hx    Social History:  reports that he has never smoked. He has never used smokeless tobacco. He reports that he does not drink alcohol and does not use drugs. Allergies: No Known Allergies Medications Prior to Admission  Medication Sig Dispense Refill   amLODipine (NORVASC) 5 MG tablet Take 1 tablet (5 mg total) by mouth daily. 90 tablet 1   clopidogrel (PLAVIX) 75 MG tablet TAKE 1 TABLET BY MOUTH EVERY DAY WITH BREAKFAST 90 tablet 1   lisinopril (ZESTRIL) 40 MG  tablet Take 1 tablet (40 mg total) by mouth daily. 90 tablet 1   metoprolol succinate (TOPROL-XL) 100 MG 24 hr tablet TAKE 1 TABLET BY MOUTH EVERY DAY WITH A MEAL 90 tablet 2   Multiple Vitamins-Iron (MULTIVITAMINS WITH IRON) TABS Take 1 tablet by mouth daily.     rosuvastatin (CRESTOR) 10 MG tablet Take 1 tablet (10 mg total) by mouth daily. 90 tablet 3   vitamin B-12 (CYANOCOBALAMIN) 1000 MCG tablet Take 1,000 mcg by mouth daily.     aspirin 81 MG tablet Take 81 mg by mouth at bedtime.       Home: Home Living Family/patient expects to be discharged to:: Unsure Living Arrangements:  Spouse/significant other Available Help at Discharge: Family, Available 24 hours/day Type of Home: House Home Access: Stairs to enter [employer] of Steps: 2 Entrance Stairs-Rails: Right Home Layout: Two level, Able to live on main level with bedroom/bathroom Bathroom Shower/Tub: Engineer, manufacturing systems: Handicapped height Bathroom Accessibility: Yes Home Equipment: None  Lives With: Spouse  Functional History: Prior Function Prior Level of Function : Independent/Modified Independent, Working/employed, Driving Mobility Comments: Pt able to be active, driving, working, mowing yard, etc ADLs Comments: Pt is very independent. 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 Functional Status:  Mobility: Bed Mobility Overal bed mobility: Needs Assistance Bed Mobility: Supine to Sit, Sit to Supine Supine to sit: Mod assist, +2 for physical assistance Sit to supine: Mod assist, +2 for physical assistance General bed mobility comments: strong posterior lean noted throughout with assistance and time able to progress to sitting with CGA and BUE support Transfers Overall transfer level: Needs assistance Equipment used: Rolling walker (2 wheels) Transfers: Sit to/from Stand, Bed to chair/wheelchair/BSC Sit to Stand: Max assist, +2 physical assistance Bed to/from chair/wheelchair/BSC transfer type:: Lateral/scoot transfer  Lateral/Scoot Transfers: Max assist, +2 physical assistance General transfer comment: pt with improved sitting balance and ability to intitiate and assist with lateral scoot transfer today Ambulation/Gait General Gait Details: 1-2 very limited side steps at EOB, assistance for R knee in stance as well as assistance to lift and place for side stepping    ADL: ADL Overall ADL's : Needs assistance/impaired General ADL Comments: MAX A don B socks seated EOB  Cognition: Cognition Overall Cognitive Status: Within Functional  Limits for tasks assessed Orientation Level: Oriented X4 Cognition Arousal/Alertness: Awake/alert Behavior During Therapy: WFL for tasks assessed/performed Overall Cognitive Status: Within Functional Limits for tasks assessed General Comments: HOH  Blood pressure 117/60, pulse 77, temperature 98.5 F (36.9 C), resp. rate 18, height 6' (1.829 m), weight 89.9 kg, SpO2 92 %. Physical Exam Gen: no distress, normal appearing HEENT: oral mucosa pink and moist, NCAT Cardio: Reg rate Chest: normal effort, normal rate of breathing Abd: soft, non-distended Ext: no edema Psych: pleasant, normal affect Skin: intact Neuro: Alert and oriented x2. No focal deficits.   Results for orders placed or performed during the hospital encounter of 07/20/22 (from the past 24 hour(s))  Surgical pcr screen     Status: Abnormal   Collection Time: 08/01/22  4:29 PM   Specimen: Nasal Mucosa; Nasal Swab  Result Value Ref Range   MRSA, PCR NEGATIVE NEGATIVE   Staphylococcus aureus POSITIVE (A) NEGATIVE  Basic metabolic panel     Status: Abnormal   Collection Time: 08/02/22  6:25 AM  Result Value Ref Range   Sodium 130 (L) 135 - 145 mmol/L   Potassium 4.3 3.5 - 5.1  mmol/L   Chloride 101 98 - 111 mmol/L   CO2 21 (L) 22 - 32 mmol/L   Glucose, Bld 115 (H) 70 - 99 mg/dL   BUN 79 (H) 8 - 23 mg/dL   Creatinine, Ser 7.82 (H) 0.61 - 1.24 mg/dL   Calcium 7.9 (L) 8.9 - 10.3 mg/dL   GFR, Estimated 23 (L) >60 mL/min   Anion gap 8 5 - 15  CBC     Status: Abnormal   Collection Time: 08/02/22  6:25 AM  Result Value Ref Range   WBC 16.1 (H) 4.0 - 10.5 K/uL   RBC 3.25 (L) 4.22 - 5.81 MIL/uL   Hemoglobin 10.3 (L) 13.0 - 17.0 g/dL   HCT 95.6 (L) 21.3 - 08.6 %   MCV 92.3 80.0 - 100.0 fL   MCH 31.7 26.0 - 34.0 pg   MCHC 34.3 30.0 - 36.0 g/dL   RDW 57.8 46.9 - 62.9 %   Platelets 209 150 - 400 K/uL   nRBC 0.0 0.0 - 0.2 %  Magnesium     Status: Abnormal   Collection Time: 08/02/22  6:25 AM  Result Value Ref Range    Magnesium 2.6 (H) 1.7 - 2.4 mg/dL   US RENAL  Result Date: 08/02/2022 CLINICAL DATA:  [id number] Acute renal failure (ARF) (HCC) [id number] EXAM: RENAL / URINARY TRACT ULTRASOUND COMPLETE COMPARISON:  CT 07/20/2022. FINDINGS: Right Kidney: Renal measurements: 11.5 x 4.6 x 5.3 cm = volume: 145.4 mL. No hydronephrosis. There is a 12.5 mm echogenic focus in the mid kidney without a correlate on recent CT. No definite nephrolithiasis. Increased cortical echogenicity. Left Kidney: Renal measurements: 13.2 x 7.7 x 5.6 cm = volume: 297.7 mL. There is a 17.3 mm echogenic focus without correlate on recent CT. No definite nephrolithiasis. Mildly increased cortical echogenicity. Bladder: Appears normal for degree of bladder distention. Other: None. IMPRESSION: No hydronephrosis. Mildly increased renal cortical echogenicity bilaterally, right greater than left, compatible with medical renal disease. Electronically Signed   By: Caprice Renshaw M.D.   On: 08/02/2022 10:23   DG Chest Port 1 View  Result Date: 08/01/2022 CLINICAL DATA:  Fever. EXAM: PORTABLE CHEST 1 VIEW COMPARISON:  Jul 27, 2022. FINDINGS: Stable cardiomediastinal silhouette. Stable bibasilar atelectasis or infiltrates are noted, right greater than left. Bony thorax is unremarkable. IMPRESSION: Stable bibasilar opacities as described above. Electronically Signed   By: Lupita Raider M.D.   On: 08/01/2022 14:28   MR BRAIN WO CONTRAST  Result Date: 08/01/2022 EXAM: MRI HEAD WITHOUT CONTRAST TECHNIQUE: Multiplanar, multiecho pulse sequences of the brain and surrounding structures were obtained without intravenous contrast. COMPARISON:  MRI of the brain December 26, 2020. FINDINGS: Brain: No acute infarction, hemorrhage, hydrocephalus, extra-axial collection or mass lesion. Scattered and confluent foci T2 air seen within the white matter of the cerebral hemispheres, nonspecific, most likely related to chronic small vessel ischemia, not significantly changed compared  to prior MRI. Mild parenchymal volume loss. Vascular: Normal flow voids. Skull and upper cervical spine: Normal marrow signal. Sinuses/Orbits: Bilateral mastoid effusion. Paranasal sinuses are clear. The orbits are maintained. Other: None. IMPRESSION: 1. No acute intracranial abnormality. 2. Moderate chronic microvascular ischemic changes of the white matter, stable. Electronically Signed   By: Baldemar Lenis M.D.   On: 08/01/2022 13:15    Assessment/Plan: Diagnosis: Debility. Does the need for close, 24 hr/day medical supervision in concert with the patient's rehab needs make it unreasonable for this patient to be served in a less intensive  setting? Yes Co-Morbidities requiring supervision/potential complications:  1) AAA: monitor blood pressure TID 2) Postoperative pain: monitor and titrate pain medications 3) MSSA bacteremia: continue IV antibiotics 4) CAD  5) HTN Due to bladder management, bowel management, safety, skin/wound care, disease management, medication administration, pain management, and patient education, does the patient require 24 hr/day rehab nursing? Yes Does the patient require coordinated care of a physician, rehab nurse, therapy disciplines of PT, OT, SLP to address physical and functional deficits in the context of the above medical diagnosis(es)? Yes Addressing deficits in the following areas: balance, endurance, locomotion, strength, transferring, bowel/bladder control, bathing, dressing, feeding, grooming, toileting, cognition, and psychosocial support Can the patient actively participate in an intensive therapy program of at least 3 hrs of therapy per day at least 5 days per week? Yes The potential for patient to make measurable gains while on inpatient rehab is excellent Anticipated functional outcomes upon discharge from inpatient rehab are min assist  with PT, supervision with OT, supervision with SLP. Estimated rehab length of stay to reach the above  functional goals is: 21-24 days Anticipated discharge destination: Home Overall Rehab/Functional Prognosis: excellent  POST ACUTE RECOMMENDATIONS: This patient's condition is appropriate for continued rehabilitative care in the following setting: CIR Patient has agreed to participate in recommended program. Yes Note that insurance prior authorization may be required for reimbursement for recommended care.   I have personally performed a face to face diagnostic evaluation of this patient. Additionally, I have examined the patient's medical record including any pertinent labs and radiographic images. If the physician assistant has documented in this note, I have reviewed and edited or otherwise concur with the physician assistant's documentation.  Thanks,  Horton Chin, MD 08/02/2022

## 2022-08-02 NOTE — Progress Notes (Signed)
PT Cancellation Note  Patient Details Name: Brett Riley MRN: [id number] DOB: 14-May-1935   Cancelled Treatment:    Reason Eval/Treat Not Completed: Other (comment). Pt out of room at this time, PT to re-attempt as able.    Olga Coaster PT, DPT 9:32 AM,08/02/22

## 2022-08-02 NOTE — Progress Notes (Signed)
OT Cancellation Note  Patient Details Name: Brett Riley MRN: [id number] DOB: 1935-05-19   Cancelled Treatment:    Reason Eval/Treat Not Completed: Patient at procedure or test/ unavailable. Pt just returned from imaging, nursing in the room for care. Will re-attempt OT tx at later date/time.   Arman Filter., MPH, MS, OTR/L ascom [phone] 08/02/22, 10:19 AM

## 2022-08-02 NOTE — Progress Notes (Signed)
Progress Note   Patient: Brett Riley [name] DOB: 1936-02-13 DOA: 07/20/2022     13 DOS: the patient was seen and examined on 08/02/2022   Brief hospital course:  Brett Riley is a 87 y.o. Caucasian male with medical history significant for coronary artery disease, hypertension, dyslipidemia and urolithiasis, who presented to the ER with acute onset of bilateral low back pain that started 07/20/2022 in the morning after he got out of bed.  He was barely able to stand per his wife.  He had nausea around  noon. Came to ED.  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 No aneurysm or dissection. 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  Patient had a AAA repair on 5/22.  Postoperatively, patient was receiving physical therapy.  Patient becomes septic with a high fever, tachycardia and altered mental status on 5/30.  Antibiotic started with cefepime and vancomycin.  Blood culture came back with MSSA on 5/31.     Principal Problem:   Penetrating atherosclerotic ulcer of aorta (HCC) Active Problems:   Uncontrolled hypertension   Coronary artery disease   Hypoxemia   Thrombocytopenia (HCC)   Preop cardiovascular exam   Atherosclerotic ulcer of aorta (HCC)   Dyslipidemia   PVD (peripheral vascular disease) (HCC)   Atrial fibrillation with rapid ventricular response (HCC)   Persistent atrial fibrillation (HCC)   Hyponatremia   Overweight (BMI 25.0-29.9)   AKI (acute kidney injury) (HCC)   Effusion of right knee   Acute metabolic encephalopathy   MSSA (methicillin susceptible Staphylococcus aureus) septicemia (HCC)   Assessment and Plan: MSSA septicemia. Metabolic encephalopathy. Generalized weakness. Patient spiked a fever on 5/30, met sepsis criteria with high fever, tachycardia and tachypnea.  Blood culture grew MSSA.  Etiology still  unclear.  Patient has a right knee joint effusion, has been seen by orthopedics, no signs of infection. Patient has severe lower back pain, initially thought this is due to penetrating aortic ulcer.  With persistent pain postop, could not rule out discitis.  MRI of the lumbar spine will be obtained.  Patient also seen by ID, will need a TEE to rule out endocarditis. Antibiotics as switched to cefazolin. Patient mental status improved since abx started.  Acute kidney injury. Hyponatremia likely secondary to SIADH Worsening renal function today, patient had urinary tension last night, had a residual of 700 mL, INO cath was performed, patient was able to urinate afterwards.  Repeated residual was 70, 170. I will continue IV fluids with normal saline.  Monitor sodium level and renal function.   Right knee effusion with osteoarthritis. He was seen by orthopedics, no indication of infection.     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  Currently on aspirin and Eliquis, high dose statin.      Atrial fibrillation new onset 05/20 22:58 Heart rate is adequately controlled.  Continue anticoagulation with Eliquis.   Essential hypertension  Dyslipidemia Continue beta-blocker and statin.   Coronary artery disease Continue current treatment.   Hypoxemia early admission - pt is not taking good breaths d/t pain  (+)D-Dimer likely d/t inflammation Condition has resolved.      Subjective:  Patient is less confused, no additional fever today.  Physical Exam: Vitals:   08/01/22 2041 08/01/22 2305 08/02/22 0530 08/02/22 1153  BP: Marland Kitchen)  114/58 102/81 125/66 117/60  Pulse: 81 77 79 77  Resp: 20 16 18 18   Temp: 98.6 F (37 C) 98.6 F (37 C) 98.4 F (36.9 C) 98.5 F (36.9 C)  TempSrc: Oral Oral    SpO2: 91% 91% 93% 92%  Weight:      Height:       General exam: Appears calm and comfortable   Respiratory system: Clear to auscultation. Respiratory effort normal. Cardiovascular system: S1 & S2 heard, RRR. No JVD, murmurs, rubs, gallops or clicks. No pedal edema. Gastrointestinal system: Abdomen is nondistended, soft and nontender. No organomegaly or masses felt. Normal bowel sounds heard. Central nervous system: Alert and oriented x2. No focal neurological deficits. Extremities: Symmetric 5 x 5 power. Skin: No rashes, lesions or ulcers Psychiatry: Mood & affect appropriate.    Data Reviewed:  Reviewed lab results, x-ray results, ultrasound results.  Family Communication: Daughter at bedside updated,  Disposition: Status is: Inpatient Remains inpatient appropriate because: Severity of disease, IV treatment.     Time spent: 50 minutes  Author: Marrion Coy, MD 08/02/2022 2:44 PM  For on call review [email or web address].

## 2022-08-02 NOTE — Progress Notes (Signed)
PHARMACY - PHYSICIAN COMMUNICATION CRITICAL VALUE ALERT - BLOOD CULTURE IDENTIFICATION (BCID)  Results for orders placed or performed during the hospital encounter of 07/20/22  Culture, blood (Routine X 2) w Reflex to ID Panel     Status: None (Preliminary result)   Collection Time: 08/01/22 12:37 PM   Specimen: BLOOD RIGHT ARM  Result Value Ref Range Status   Specimen Description BLOOD RIGHT ARM  Final   Special Requests   Final    BOTTLES DRAWN AEROBIC AND ANAEROBIC Blood Culture adequate volume   Culture  Setup Time   Final    GRAM POSITIVE COCCI IN BOTH AEROBIC AND ANAEROBIC BOTTLES CRITICAL VALUE NOTED.  VALUE IS CONSISTENT WITH PREVIOUSLY REPORTED AND CALLED VALUE. Performed at [facility], [address] [place], [place] [address]    Culture [facility] POSITIVE COCCI  Final   Report Status PENDING  Incomplete  Culture, blood (Routine X 2) w Reflex to ID Panel     Status: None (Preliminary result)   Collection Time: 08/01/22  1:04 PM   Specimen: BLOOD RIGHT ARM  Result Value Ref Range Status   Specimen Description BLOOD RIGHT ARM  Final   Special Requests   Final    BOTTLES DRAWN AEROBIC AND ANAEROBIC Blood Culture adequate volume   Culture  Setup Time   Final    GRAM POSITIVE COCCI IN BOTH AEROBIC AND ANAEROBIC BOTTLES Organism ID to follow CRITICAL RESULT CALLED TO, READ BACK BY AND VERIFIED WITH: Elenora Gamma, PHARMD AT 1128 ON 08/01/22 BY GM Performed at [facility], [address] [place], [place] [address]    Culture GRAM POSITIVE COCCI  Final   Report Status PENDING  Incomplete  Blood Culture ID Panel (Reflexed)     Status: Abnormal   Collection Time: 08/01/22  1:04 PM  Result Value Ref Range Status   Enterococcus faecalis NOT DETECTED NOT DETECTED Final   Enterococcus Faecium NOT DETECTED NOT DETECTED Final   Listeria monocytogenes NOT DETECTED NOT DETECTED Final   Staphylococcus species DETECTED (A) NOT DETECTED Final    Comment: CRITICAL  RESULT CALLED TO, READ BACK BY AND VERIFIED WITH: Misbah Hornaday BLUE, PHARMD AT 1128 ON 08/01/22 BY GM    Staphylococcus aureus (BCID) DETECTED (A) NOT DETECTED Final    Comment: CRITICAL RESULT CALLED TO, READ BACK BY AND VERIFIED WITH: Kodie Pick BLUE, PHARMD AT 1128 ON 08/01/22 BY GM    Staphylococcus epidermidis NOT DETECTED NOT DETECTED Final   Staphylococcus lugdunensis NOT DETECTED NOT DETECTED Final   Streptococcus species NOT DETECTED NOT DETECTED Final   Streptococcus agalactiae NOT DETECTED NOT DETECTED Final   Streptococcus pneumoniae NOT DETECTED NOT DETECTED Final   Streptococcus pyogenes NOT DETECTED NOT DETECTED Final   A.calcoaceticus-baumannii NOT DETECTED NOT DETECTED Final   Bacteroides fragilis NOT DETECTED NOT DETECTED Final   Enterobacterales NOT DETECTED NOT DETECTED Final   Enterobacter cloacae complex NOT DETECTED NOT DETECTED Final   Escherichia coli NOT DETECTED NOT DETECTED Final   Klebsiella aerogenes NOT DETECTED NOT DETECTED Final   Klebsiella oxytoca NOT DETECTED NOT DETECTED Final   Klebsiella pneumoniae NOT DETECTED NOT DETECTED Final   Proteus species NOT DETECTED NOT DETECTED Final   Salmonella species NOT DETECTED NOT DETECTED Final   Serratia marcescens NOT DETECTED NOT DETECTED Final   Haemophilus influenzae NOT DETECTED NOT DETECTED Final   Neisseria meningitidis NOT DETECTED NOT DETECTED Final   Pseudomonas aeruginosa NOT DETECTED NOT DETECTED Final   Stenotrophomonas maltophilia NOT DETECTED NOT DETECTED  Final   Candida albicans NOT DETECTED NOT DETECTED Final   Candida auris NOT DETECTED NOT DETECTED Final   Candida glabrata NOT DETECTED NOT DETECTED Final   Candida krusei NOT DETECTED NOT DETECTED Final   Candida parapsilosis NOT DETECTED NOT DETECTED Final   Candida tropicalis NOT DETECTED NOT DETECTED Final   Cryptococcus neoformans/gattii NOT DETECTED NOT DETECTED Final   Meth resistant mecA/C and MREJ NOT DETECTED NOT DETECTED Final    Comment:  Performed at [facility], [address] [place], [place] [address]  SARS Coronavirus 2 by RT PCR (hospital order, performed in [facility] hospital lab) *cepheid single result test* Anterior Nasal Swab     Status: None   Collection Time: 08/01/22  2:43 PM   Specimen: Anterior Nasal Swab  Result Value Ref Range Status   SARS Coronavirus 2 by RT PCR NEGATIVE NEGATIVE Final    Comment: (NOTE) SARS-CoV-2 target nucleic acids are NOT DETECTED.  The SARS-CoV-2 RNA is generally detectable in upper and lower respiratory specimens during the acute phase of infection. The lowest concentration of SARS-CoV-2 viral copies this assay can detect is 250 copies / mL. A negative result does not preclude SARS-CoV-2 infection and should not be used as the sole basis for treatment or other patient management decisions.  A negative result may occur with improper specimen collection / handling, submission of specimen other than nasopharyngeal swab, presence of viral mutation(s) within the areas targeted by this assay, and inadequate number of viral copies (<250 copies / mL). A negative result must be combined with clinical observations, patient history, and epidemiological information.  Fact Sheet for Patients:   RoadLapTop.co.za  Fact Sheet for Healthcare Providers: http://kim-miller.com/  This test is not yet approved or  cleared by the [place] FDA and has been authorized for detection and/or diagnosis of SARS-CoV-2 by FDA under an Emergency Use Authorization (EUA).  This EUA will remain in effect (meaning this test can be used) for the duration of the COVID-19 declaration under Section 564(b)(1) of the Act, 21 U.S.C. section 360bbb-3(b)(1), unless the authorization is terminated or revoked sooner.  Performed at [facility], [address] [place], [place] [address]   Surgical pcr screen     Status: Abnormal   Collection  Time: 08/01/22  4:29 PM   Specimen: Nasal Mucosa; Nasal Swab  Result Value Ref Range Status   MRSA, PCR NEGATIVE NEGATIVE Final   Staphylococcus aureus POSITIVE (A) NEGATIVE Final    Comment: (NOTE) The Xpert SA Assay (FDA approved for NASAL specimens in patients 48 years of age and older), is one component of a comprehensive surveillance program. It is not intended to diagnose infection nor to guide or monitor treatment. Performed at [facility], [address] [place], [place] [address]     BCID Results: 4 of 4 bottles w/ Staph Aureus, no resistance detected.  Pt currently on Cefepime & Vancomycin.  Name of provider contacted: Doylene Canard, MD   Changes to prescribed antibiotics required: Continue current abx pending F/U review by rounding team.  Otelia Sergeant, PharmD, [facility] 08/02/2022 12:47 AM

## 2022-08-02 NOTE — Progress Notes (Signed)
Occupational Therapy Treatment Patient Details Name: Brett Riley MRN: [id number] DOB: 04-07-1935 Today's Date: 08/02/2022   History of present illness 87 y.o. male with a past medical history of coronary artery disease, carotid artery disease status post left carotid endarterectomy, PAD, hypertension, hyperlipidemia, who has been seen and evaluated for cardiac restratification for vascular repair of penetrating aortic ulcer and new onset atrial fibrillation.   OT comments  Pt seen for OT tx, cotx overlap for mobility with PT.  Pt completed ADL transfers with MAX A +2, once in recliner, completed seated grooming tasks with limited setup assist. Pt instructed in chin tucks and cervical extension ex x10 each. Pt noting some pain at end range of motion. RN just gave pain medication. Pt tolerated well, demonstrating progress towards goals this date.    Recommendations for follow up therapy are one component of a multi-disciplinary discharge planning process, led by the attending physician.  Recommendations may be updated based on patient status, additional functional criteria and insurance authorization.    Assistance Recommended at Discharge Intermittent Supervision/Assistance  Patient can return home with the following  A lot of help with bathing/dressing/bathroom;Assistance with cooking/housework;Assist for transportation;Help with stairs or ramp for entrance;Two people to help with walking and/or transfers   Equipment Recommendations  Other (comment) (defer to next venue)    Recommendations for Other Services      Precautions / Restrictions Precautions Precautions: Fall Restrictions Weight Bearing Restrictions: No       Mobility Bed Mobility Overal bed mobility: Needs Assistance Bed Mobility: Supine to Sit, Sit to Supine     Supine to sit: Mod assist, +2 for physical assistance          Transfers Overall transfer level: Needs assistance Equipment used: Rolling walker (2  wheels) Transfers: Sit to/from Stand, Bed to chair/wheelchair/BSC Sit to Stand: Max assist, +2 physical assistance          Lateral/Scoot Transfers: Max assist, +2 physical assistance General transfer comment: pt with improved sitting balance and ability to intitiate and assist with lateral scoot transfer today     Balance Overall balance assessment: Needs assistance Sitting-balance support: Feet supported, Bilateral upper extremity supported Sitting balance-Leahy Scale: Poor Sitting balance - Comments: poor unsupported static sitting balance, requiring UE support on EOB   Standing balance support: Bilateral upper extremity supported, Reliant on assistive device for balance Standing balance-Leahy Scale: Poor                             ADL either performed or assessed with clinical judgement   ADL Overall ADL's : Needs assistance/impaired     Grooming: Sitting;Set up;Wash/dry face;Oral care Grooming Details (indicate cue type and reason): Pt setup for denture care and teeth brushing from seated position. Tolerated with back support                                    Extremity/Trunk Assessment              Vision       Perception     Praxis      Cognition Arousal/Alertness: Awake/alert Behavior During Therapy: WFL for tasks assessed/performed Overall Cognitive Status: Within Functional Limits for tasks assessed  Exercises Other Exercises Other Exercises: Pt instructed in chin tucks and cervical extension exercises x10 each, limited by pain    Shoulder Instructions       General Comments      Pertinent Vitals/ Pain       Pain Assessment Pain Assessment: 0-10 Pain Score: 5  Pain Location: upper back/neck Pain Descriptors / Indicators: Aching Pain Intervention(s): Limited activity within patient's tolerance, Monitored during session, RN gave pain meds during session,  Repositioned  Home Living                                          Prior Functioning/Environment              Frequency  Min 3X/week        Progress Toward Goals  OT Goals(current goals can now be found in the care plan section)  Progress towards OT goals: Progressing toward goals  Acute Rehab OT Goals Patient Stated Goal: to walk OT Goal Formulation: With patient/family Time For Goal Achievement: 08/09/22 Potential to Achieve Goals: Good  Plan Discharge plan remains appropriate;Frequency remains appropriate    Co-evaluation    PT/OT/SLP Co-Evaluation/Treatment: Yes Reason for Co-Treatment: Necessary to address cognition/behavior during functional activity;For patient/therapist safety;To address functional/ADL transfers PT goals addressed during session: Mobility/safety with mobility;Balance OT goals addressed during session: ADL's and self-care;Proper use of Adaptive equipment and DME      AM-PAC OT "6 Clicks" Daily Activity     Outcome Measure   Help from another person eating meals?: A Little Help from another person taking care of personal grooming?: A Little Help from another person toileting, which includes using toliet, bedpan, or urinal?: Total Help from another person bathing (including washing, rinsing, drying)?: A Lot Help from another person to put on and taking off regular upper body clothing?: A Lot Help from another person to put on and taking off regular lower body clothing?: A Lot 6 Click Score: 13    End of Session    OT Visit Diagnosis: Unsteadiness on feet (R26.81);Muscle weakness (generalized) (M62.81) Pain - part of body:  (neck/back)   Activity Tolerance Patient tolerated treatment well   Patient Left in chair;with call bell/phone within reach;with chair alarm set;with family/visitor present   Nurse Communication          Time: 1440-1506 OT Time Calculation (min): 26 min  Charges: OT General Charges $OT  Visit: 1 Visit OT Treatments $Self Care/Home Management : 8-22 mins  Arman Filter., MPH, MS, OTR/L ascom [phone] 08/02/22, 4:05 PM

## 2022-08-02 NOTE — Progress Notes (Signed)
Physical Therapy Treatment Patient Details Name: Brett Riley MRN: [id number] DOB: 31-Jan-1936 Today's Date: 08/02/2022   History of Present Illness 87 y.o. male with a past medical history of coronary artery disease, carotid artery disease status post left carotid endarterectomy, PAD, hypertension, hyperlipidemia, who has been seen and evaluated for cardiac restratification for vascular repair of penetrating aortic ulcer and new onset atrial fibrillation.    PT Comments    Patient more alert this session, reported 6/10 LBP. Improved R knee mobility and decreased knee pain compared to previous session. He was also able to participate more with bed mobility and lateral scoot, but still ultimately maxAx2 for all mobility.  Pt left with OT. The patient would benefit from further skilled PT intervention to continue to progress towards goals as able.      Recommendations for follow up therapy are one component of a multi-disciplinary discharge planning process, led by the attending physician.  Recommendations may be updated based on patient status, additional functional criteria and insurance authorization.  Follow Up Recommendations  Can patient physically be transported by private vehicle: No    Assistance Recommended at Discharge Frequent or constant Supervision/Assistance  Patient can return home with the following Two people to help with walking and/or transfers;Two people to help with bathing/dressing/bathroom;Assistance with cooking/housework;Assist for transportation;Help with stairs or ramp for entrance   Equipment Recommendations  Other (comment)    Recommendations for Other Services       Precautions / Restrictions Precautions Precautions: Fall Restrictions Weight Bearing Restrictions: No     Mobility  Bed Mobility Overal bed mobility: Needs Assistance Bed Mobility: Supine to Sit, Sit to Supine     Supine to sit: Mod assist, +2 for physical assistance           Transfers Overall transfer level: Needs assistance Equipment used: Rolling walker (2 wheels) Transfers: Sit to/from Stand, Bed to chair/wheelchair/BSC            Lateral/Scoot Transfers: Max assist, +2 physical assistance General transfer comment: pt with improved sitting balance and ability to intitiate and assist with lateral scoot transfer today    Ambulation/Gait                   Stairs             Wheelchair Mobility    Modified Rankin (Stroke Patients Only)       Balance Overall balance assessment: Needs assistance Sitting-balance support: Feet supported, Bilateral upper extremity supported Sitting balance-Leahy Scale: Poor Sitting balance - Comments: able to maintain sitting balance once sitting midline with BUE propped                                    Cognition Arousal/Alertness: Awake/alert Behavior During Therapy: WFL for tasks assessed/performed Overall Cognitive Status: Within Functional Limits for tasks assessed                                          Exercises Other Exercises Other Exercises: seated heel slides AROM x10    General Comments        Pertinent Vitals/Pain Pain Assessment Pain Assessment: 0-10 Pain Score: 6  Pain Location: low back pain biggest complaint of session today Pain Descriptors / Indicators: Aching, Discomfort, Grimacing, Guarding Pain Intervention(s): Limited activity within patient's tolerance, Monitored  during session, Premedicated before session, Repositioned, RN gave pain meds during session    Home Living                          Prior Function            PT Goals (current goals can now be found in the care plan section) Progress towards PT goals: Progressing toward goals    Frequency    Min 4X/week      PT Plan Current plan remains appropriate    Co-evaluation PT/OT/SLP Co-Evaluation/Treatment: Yes Reason for Co-Treatment: Necessary  to address cognition/behavior during functional activity;For patient/therapist safety;To address functional/ADL transfers PT goals addressed during session: Mobility/safety with mobility;Balance OT goals addressed during session: ADL's and self-care;Proper use of Adaptive equipment and DME      AM-PAC PT "6 Clicks" Mobility   Outcome Measure  Help needed turning from your back to your side while in a flat bed without using bedrails?: A Lot Help needed moving from lying on your back to sitting on the side of a flat bed without using bedrails?: A Lot Help needed moving to and from a bed to a chair (including a wheelchair)?: A Lot Help needed standing up from a chair using your arms (e.g., wheelchair or bedside chair)?: Total Help needed to walk in hospital room?: Total Help needed climbing 3-5 steps with a railing? : Total 6 Click Score: 9    End of Session   Activity Tolerance: Patient limited by pain Patient left: with chair alarm set;in chair;with call bell/phone within reach;Other (comment) (with OT at bedside) Nurse Communication: Mobility status PT Visit Diagnosis: Muscle weakness (generalized) (M62.81);Difficulty in walking, not elsewhere classified (R26.2)     Time: 1440-1456 PT Time Calculation (min) (ACUTE ONLY): 16 min  Charges:  $Therapeutic Activity: 8-22 mins                     Olga Coaster PT, DPT 3:36 PM,08/02/22

## 2022-08-02 NOTE — Evaluation (Addendum)
Clinical/Bedside Swallow Evaluation Patient Details  Name: Brett Riley MRN: [id number] Date of Birth: Jun 14, 1935  Today's Date: 08/02/2022 Time: SLP Start Time (ACUTE ONLY): 1035 SLP Stop Time (ACUTE ONLY): 1150 SLP Time Calculation (min) (ACUTE ONLY): 75 min  Past Medical History:  Past Medical History:  Diagnosis Date   Cancer (HCC)    skin cancer basal   Carotid arterial disease (HCC)    a. 02/2016 L CEA; b. 01/2017 U/S: patent LICA, 1-39% RICA.   Celiac artery stenosis (HCC)    Coronary artery disease    a. 01/2016 MV: mild apical/basal inferior, apical lateral, mid anterolateral, and mid inferolateral ischemia. EF 57%; b. 02/2016 Cath: LM 40ost, LAD 70p/m, 20d, D1 95 (small), LCX nl, RCA 66m, RPDA 90 (small), EF 55-65%-->med Rx. Rec CABG for recurrent symptoms.   Hearing loss    History of echocardiogram    a. 02/2016 Echo: EF 50-55%, no rwma, mild MR.   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   Osteoarthritis, hip, bilateral    Left > Right   Past Surgical History:  Past Surgical History:  Procedure Laterality Date   AORTIC INTERVENTION N/A 07/24/2022   Procedure: AORTIC INTERVENTION;  Surgeon: Renford Dills, MD;  Location: [facility];  Service: Cardiovascular;  Laterality: N/A;   CARDIAC CATHETERIZATION N/A 01/19/2016   Procedure: Left Heart Cath and Coronary Angiography;  Surgeon: Iran Ouch, MD;  Location: [facility];  Service: Cardiovascular;  Laterality: N/A;   COLONOSCOPY     CYSTOSCOPY/URETEROSCOPY/HOLMIUM LASER/STENT PLACEMENT Right 04/13/2021   Procedure: CYSTOSCOPY/URETEROSCOPY/HOLMIUM LASER/STENT PLACEMENT;  Surgeon: Sondra Come, MD;  Location: [facility];  Service: Urology;  Laterality: Right;   ENDARTERECTOMY Left 02/15/2016   Procedure: ENDARTERECTOMY CAROTID;  Surgeon: Annice Needy, MD;  Location: [facility];  Service: Vascular;  Laterality: Left;    HPI:  Pt is a 87 y.o. 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 On the day of admit, he presented to the ER with acute onset of bilateral low back pain that started this morning after he got out of bed. He was barely able to stand per his wife. He had nausea around noon that day.  CT of Abd. on day of admit: Lower chest: Bibasilar scarring/atelectasis. No acute abnormality.  CXR that day: clear.  CXR on 08/01/22: Stable bibasilar atelectasis or  infiltrates are noted, right greater than left.   MRI since admit: No acute intracranial abnormality.  2. Moderate chronic microvascular ischemic changes of the white  matter, stable.  OF NOTE: per MD note today, "patient had a AAA repair on 5/22.  Postoperatively, patient was receiving physical therapy.  Patient became septic with a high fever, tachycardia and altered mental status on 5/30.  Antibiotic started with cefepime and vancomycin.  Blood culture came back with MSSA on 5/31.".    Assessment / Plan / Recommendation  Clinical Impression   Pt seen for BSE today. Pt awake, verbal and engaged appropriately w/ this SLP. A/O x3. Min HOH. He appeared weak/deconditioned w/ shaky UEs/weakness (bilat) as well as Belching at Baseline prior to po's. Wife stated he has "always belched like that for years". Wife and family present during session. On RA; afebrile; WBC elevated. Sodium 130(see labs). Per MD note, Blood culture came back with  MSSA on 5/31; pt on antibiotics.  Pt appears to present w/ grossly functional oropharyngeal phase swallowing w/ No overt oropharyngeal phase dysphagia noted, No overt neuromuscular deficits noted. Pt consumed po trials w/ No immediate, overt clinical s/s of aspiration during po trials. Pt did exhibit mild cough/throat clearing x1-2 b/t trials w/ 1 event post Belching. There was increase in frequency nor consistency to the  1-2 events -- strongly suspect impact from his severely deconditioned State and shaky UEs during self-feeding played a role w/ the coordination of swallowing/po tasks.  Pt appears at reduced risk for aspiration when following general aspiration precautions, when supported at meals and taking Rest Breaks for conservation of energy, and when eating easy to eat foods.   However, pt does have challenging factors that could impact oropharyngeal swallowing to include Pain/discomfort of back, severely deconditioned status/weakness, lengthy hospitalization/illness, and need for support w/ positioning and self-feeding at meals. Wife also stated pt had been lethargic at times in recent few days but po's were still attempted. These factors can increase risk for aspiration/aspiration pneumonia, dysphagia as well as decreased oral intake overall.   During po trials, pt consumed all consistencies w/ No immediate, overt coughing, decline in vocal quality, or change in respiratory presentation during/post trials. O2 sats remained 96-97%. Noted a few "quick" swallows take/completed as he seemed to lose control in his UEs holding the cup. Supported pt better under his arms for using his UEs during cup drinking. The mild cough/throat clearing occurred x1-2 w/ no further frequency of occurrence - vocal quality clear b/t trials and no decline in respiratory effort/rate. Belching was noted PRIOR to 1 event. Oral phase appeared [facility] w/ timely bolus management, mastication, and control of bolus propulsion for A-P transfer for swallowing. Oral clearing achieved w/ all trial consistencies -- moistened, soft foods given.  OM Exam appeared [facility] w/ no unilateral weakness noted. Speech Clear. Pt fed self w/ min+ setup support - shaky UEs during tasks noted.   Recommend continue a fairly Regular consistency diet w/ well-Cut meats, moistened foods; Thin liquids -- NO STRAWS USE. Pt should help to Hold Cup when drinking - support arms w/  pillows. Recommend general aspiration precautions, reduce distractions and talking during meals. Tray setup and positioning support during meals -- sitting in a CHAIR or on EOB best for eating/drinking vs lying in bed. Rest Breaks during meals for conservation of energy. Pills WHOLE in Puree for safer, easier swallowing -- pt did this during session w/ success. Pills WHOLE in Puree was encouraged now and for D/C to the Wife. REFLUX precautions.  Much Education given on Pills in Puree; food consistencies and easy to eat options; general swallowing and impact of illness on swallowing; risk for aspiration during illness; aspiration precautions to pt and Wife, family present. All gave agreement. Handouts given on above education including recommendation for a dysphagia drink cup for better handling/holding control during drinking and to ensure small sips.   No further skilled, acute needs indicated. NSG to reconsult if any new needs arise. NSG updated, agreed. MD updated. Recommend Dietician f/u for support including Ensure. SLP Visit Diagnosis: Dysphagia, unspecified (R13.10)    Aspiration Risk  Mild aspiration risk;Risk for inadequate nutrition/hydration (d/t lengthy hospitalization/illness; reduced risk when following general aspiration precautions)    Diet Recommendation   a fairly Regular consistency diet w/ well-Cut meats, moistened foods; Thin liquids -- NO STRAWS USE. Pt should help to Hold Cup when drinking - support arms w/ pillows. Recommend general  aspiration precautions, reduce distractions and talking during meals. Tray setup and positioning support during meals -- sitting in a CHAIR or on EOB best for eating/drinking vs lying in bed. Rest Breaks during meals for conservation of energy. REFLUX precautions.  Medication Administration: Whole meds with puree (for safer swallowing)    Other  Recommendations Recommended Consults: Consider GI evaluation;Consider esophageal assessment (Dietician  f/u) Oral Care Recommendations: Oral care BID;Oral care before and after PO;Staff/trained caregiver to provide oral care (support)    Recommendations for follow up therapy are one component of a multi-disciplinary discharge planning process, led by the attending physician.  Recommendations may be updated based on patient status, additional functional criteria and insurance authorization.  Follow up Recommendations Follow physician's recommendations for discharge plan and follow up therapies (if any at next venue of care)      Assistance Recommended at Discharge  Intermittent-full during this time  Functional Status Assessment Patient has had a recent decline in their functional status and demonstrates the ability to make significant improvements in function in a reasonable and predictable amount of time.  Frequency and Duration  (n/a)   (n/a)       Prognosis Prognosis for improved oropharyngeal function: Fair (-Good) Barriers to Reach Goals: Time post onset;Severity of deficits Barriers/Prognosis Comment: Deconditioned status; need for support at meals      Swallow Study   General Date of Onset: 07/20/22 HPI: Pt is a 87 y.o. 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 On the day of admit, he presented to the ER with acute onset of bilateral low back pain that started this morning after he got out of bed. He was barely able to stand per his wife. He had nausea around noon that day.  CT of Abd. on day of admit: Lower chest: Bibasilar scarring/atelectasis. No acute abnormality.  CXR that day: clear.  CXR on 08/01/22: Stable bibasilar atelectasis or  infiltrates are noted, right greater than left.   MRI since admit: No acute intracranial abnormality.  2. Moderate chronic microvascular ischemic changes of the white  matter, stable. Type of Study: Bedside Swallow Evaluation Previous Swallow  Assessment: none Diet Prior to this Study: Regular;Thin liquids (Level 0) Temperature Spikes Noted: No (WBC elevated; other labs: abnormal including Na 130.) Respiratory Status: Room air History of Recent Intubation: No Behavior/Cognition: Alert;Cooperative;Pleasant mood;Requires cueing (min) Oral Cavity Assessment: Within Functional Limits Oral Care Completed by SLP: Yes Oral Cavity - Dentition: Adequate natural dentition Vision: Functional for self-feeding Self-Feeding Abilities: Able to feed self;Needs assist;Needs set up (weak, shaky UEs (bilat)) Patient Positioning: Upright in bed (needed much support for full upright sitting w/ head forward) Baseline Vocal Quality: Normal Volitional Cough: Strong Volitional Swallow: Able to elicit    Oral/Motor/Sensory Function Overall Oral Motor/Sensory Function: Within functional limits   Ice Chips Ice chips: Within functional limits Presentation: Spoon (fed; 5-6 tirals)   Thin Liquid Thin Liquid: Within functional limits Presentation: Cup;Self Fed (~5+ ozs total) Other Comments: water, coffee; mild cough/throat clear x1-2 b/t trials and post Belching x1    Nectar Thick Nectar Thick Liquid: Not tested   Honey Thick Honey Thick Liquid: Not tested   Puree Puree: Within functional limits Presentation: Self Fed;Spoon (supported; 8+ trials)   Solid     Solid: Within functional limits Presentation: Self Fed;Spoon (15+ trials)        Jerilynn Som, MS, CCC-SLP Speech Language Pathologist  Rehab Services; [facility] [place] [phone] (ascom) Rashawna Scoles 08/02/2022,2:57 PM

## 2022-08-02 NOTE — Consult Note (Signed)
NAME: Brett Riley  DOB: 01/20/1936  MRN: [id number]  Date/Time: 08/02/2022 11:44 AM  REQUESTING PROVIDER Dr.Zhang Subjective:  REASON FOR CONSULT: MSSA bacteremia Pt is a limited historian because of his illness. Wife and daughter at bed side- Reviewed medical records thoroughly OLSEN KUBICK is a 87 y.o. with a history of PVD, HTN, CAD presented to the ED on 07/20/22 with low back b/l and acute in nature of 1 day duration after getting out of bed In the ED vitals were BP of 1539/73, temp 99.2, Pulse 73 and RR 19  Latest Reference Range & Units 07/20/22  WBC 4.0 - 10.5 K/uL 10.3  Hemoglobin 13.0 - 17.0 g/dL 62.1  HCT 30.8 - 65.7 % 39.7  Platelets 150 - 400 K/uL 158  Creatinine 0.61 - 1.24 mg/dL 8.46    CTA of the abdomen r/o dissection or aortic aneurysm but there was a  penetrating atherosclerotic ulcer of the infrarenal abdominal aorta with a 1 cm neck and a 7 mm depth. Vascular surgery was consulted .  On 07/21/22 he had a wbc of 17.9- no blood culture sent. On 5/21 he was in rapid afib and seen by cardiology and given diltiazem and metoprolol and heparin drip. Echo did not show any gross abnormality on 07/24/22 he underwent endovascular procedure with placement of a 2.5 cm * 7cm with 16mm * 40mm limbs endologix aortic endoprosthesis placement thru femoral artery access. A suprarenal aortic extension cuff was also placed of 25mm*  80mm Post op he was in ICU and his back pain had improved Intermittent hallucination On 5/30 he developed rt knee pain and effusion and increased weakness and had a fever of 102.7. blood culture sent and received a dose of cefepime and vanco No antibiotics previously in this admission except cefazolin for surgical prophylaxis Blood culture 4/4 MSSA and I am seeing the patient Pt seen by ortho and do not think he has septic knee  PMH PVD Small iliac artery aneurysms Carotid artery disease- CEA left 2017 Penetrating atherosclerotic ulcer of  aorta HTN HLD CAD Rt Renal stone/ureteric stone treated in feb 2023 with stent, holmium laser BCC skin Arthritis hips     Past Surgical History:  Procedure Laterality Date   AORTIC INTERVENTION N/A 07/24/2022   Procedure: AORTIC INTERVENTION;  Surgeon: Renford Dills, MD;  Location: [facility] INVASIVE CV LAB;  Service: Cardiovascular;  Laterality: N/A;   CARDIAC CATHETERIZATION N/A 01/19/2016   Procedure: Left Heart Cath and Coronary Angiography;  Surgeon: Iran Ouch, MD;  Location: [facility] INVASIVE CV [facility];  Service: Cardiovascular;  Laterality: N/A;   COLONOSCOPY     CYSTOSCOPY/URETEROSCOPY/HOLMIUM LASER/STENT PLACEMENT Right 04/13/2021   Procedure: CYSTOSCOPY/URETEROSCOPY/HOLMIUM LASER/STENT PLACEMENT;  Surgeon: Sondra Come, MD;  Location: [facility] ORS;  Service: Urology;  Laterality: Right;   ENDARTERECTOMY Left 02/15/2016   Procedure: ENDARTERECTOMY CAROTID;  Surgeon: Annice Needy, MD;  Location: [facility] ORS;  Service: Vascular;  Laterality: Left;    Social History   Socioeconomic History   Marital status: Married    Spouse name: Not on file   Number of children: 1   Years of education: Not on file   Highest education level: Not on file  Occupational History    Employer: [employer]  Tobacco Use   Smoking status: Never   Smokeless tobacco: Never  Vaping Use   Vaping Use: Never used  Substance and Sexual Activity   Alcohol use: No    Alcohol/week: 0.0 standard drinks of alcohol  Drug use: No   Sexual activity: Not Currently  Other Topics Concern   Not on file  Social History Narrative   Not on file   Social Determinants of Health   Financial Resource Strain: Low Risk  (08/29/2020)   Overall Financial Resource Strain (CARDIA)    Difficulty of Paying Living Expenses: Not hard at all  Food Insecurity: No Food Insecurity (07/20/2022)   Hunger Vital Sign    Worried About Running Out of Food in the Last Year: Never true    Ran Out of Food in the Last Year: Never true   Transportation Needs: No Transportation Needs (07/20/2022)   PRAPARE - Administrator, Civil Service (Medical): No    Lack of Transportation (Non-Medical): No  Physical Activity: Insufficiently Active (08/29/2020)   Exercise Vital Sign    Days of Exercise per Week: 3 days    Minutes of Exercise per Session: 20 min  Stress: No Stress Concern Present (08/29/2020)   [employer] of Occupational Health - Occupational Stress Questionnaire    Feeling of Stress : Not at all  Social Connections: Unknown (08/29/2020)   Social Connection and Isolation Panel [NHANES]    Frequency of Communication with Friends and Family: Not on file    Frequency of Social Gatherings with Friends and Family: Not on file    Attends Religious Services: Not on file    Active Member of Clubs or Organizations: Not on file    Attends Banker Meetings: Not on file    Marital Status: Married  Intimate Partner Violence: Not At Risk (07/20/2022)   Humiliation, Afraid, Rape, and Kick questionnaire    Fear of Current or Ex-Partner: No    Emotionally Abused: No    Physically Abused: No    Sexually Abused: No    Family History  Problem Relation Age of Onset   Stroke Mother    Heart disease Father        MI   Heart attack Father    Breast cancer Sister    Colon cancer Neg Hx    Prostate cancer Neg Hx    No Known Allergies I? Current Facility-Administered Medications  Medication Dose Route Frequency Provider Last Rate Last Admin   0.9 %  sodium chloride infusion   Intravenous Continuous Marrion Coy, MD 75 mL/hr at 08/02/22 1014 Restarted at 08/02/22 1014   acetaminophen (TYLENOL) tablet 650 mg  650 mg Oral Q6H PRN Annice Needy, MD   650 mg at 08/02/22 4098   Or   acetaminophen (TYLENOL) suppository 650 mg  650 mg Rectal Q6H PRN Annice Needy, MD       amiodarone (PACERONE) tablet 200 mg  200 mg Oral BID Lorine Bears A, MD   200 mg at 08/02/22 1016   apixaban (ELIQUIS) tablet 2.5 mg  2.5  mg Oral BID Marrion Coy, MD   2.5 mg at 08/02/22 1016   aspirin EC tablet 81 mg  81 mg Oral Daily Annice Needy, MD   81 mg at 08/02/22 1015   ceFAZolin (ANCEF) IVPB 2g/100 mL premix  2 g Intravenous Q12H Marrion Coy, MD 200 mL/hr at 08/02/22 1022 2 g at 08/02/22 1022   Chlorhexidine Gluconate Cloth 2 % PADS 6 each  6 each Topical Daily Loyce Dys, MD   6 each at 08/02/22 1017   cyanocobalamin (VITAMIN B12) tablet 1,000 mcg  1,000 mcg Oral Daily Annice Needy, MD   1,000 mcg at 08/02/22  1016   diltiazem (CARDIZEM CD) 24 hr capsule 360 mg  360 mg Oral Daily Antonieta Iba, MD   360 mg at 08/02/22 1016   hydrALAZINE (APRESOLINE) injection 10 mg  10 mg Intravenous Q6H PRN Annice Needy, MD   10 mg at 07/31/22 1323   lidocaine (LIDODERM) 5 % 1 patch  1 patch Transdermal Q24H Marrion Coy, MD   1 patch at 08/02/22 1017   magnesium hydroxide (MILK OF MAGNESIA) suspension 30 mL  30 mL Oral Daily PRN Annice Needy, MD   30 mL at 07/28/22 1633   metoprolol succinate (TOPROL-XL) 24 hr tablet 50 mg  50 mg Oral BID Creig Hines, NP   50 mg at 08/02/22 1016   metoprolol tartrate (LOPRESSOR) injection 5 mg  5 mg Intravenous Q6H PRN Annice Needy, MD       multivitamin with minerals tablet 1 tablet  1 tablet Oral Daily Annice Needy, MD   1 tablet at 08/02/22 1017   mupirocin ointment (BACTROBAN) 2 % 1 Application  1 Application Nasal BID Marrion Coy, MD   1 Application at 08/02/22 1017   ondansetron (ZOFRAN) injection 4 mg  4 mg Intravenous Q4H PRN Annice Needy, MD   4 mg at 07/23/22 2145   ondansetron (ZOFRAN) injection 4 mg  4 mg Intravenous Q6H PRN Annice Needy, MD       Oral care mouth rinse  15 mL Mouth Rinse PRN Loyce Dys, MD       rosuvastatin (CRESTOR) tablet 40 mg  40 mg Oral Daily Annice Needy, MD   40 mg at 08/02/22 1016   senna-docusate (Senokot-S) tablet 2 tablet  2 tablet Oral BID Marrion Coy, MD   2 tablet at 08/02/22 1017   sodium chloride (OCEAN) 0.65 % nasal spray 1 spray   1 spray Each Nare PRN Loyce Dys, MD   1 spray at 07/27/22 1706   sodium chloride tablet 1 g  1 g Oral BID WC Rosezetta Schlatter T, MD   1 g at 08/02/22 1016   traZODone (DESYREL) tablet 25 mg  25 mg Oral QHS PRN Annice Needy, MD   25 mg at 07/30/22 2158     Abtx:  Anti-infectives (From admission, onward)    Start     Dose/Rate Route Frequency Ordered Stop   08/02/22 1000  ceFAZolin (ANCEF) IVPB 2g/100 mL premix        2 g 200 mL/hr over 30 Minutes Intravenous Every 12 hours 08/02/22 0737     08/01/22 1500  vancomycin (VANCOREADY) IVPB 1750 mg/350 mL        1,750 mg 175 mL/hr over 120 Minutes Intravenous  Once 08/01/22 1222 08/01/22 1829   08/01/22 1500  ceFEPIme (MAXIPIME) 2 g in sodium chloride 0.9 % 100 mL IVPB  Status:  Discontinued        2 g 200 mL/hr over 30 Minutes Intravenous Every 24 hours 08/01/22 1222 08/02/22 0732   08/01/22 1202  vancomycin variable dose per unstable renal function (pharmacist dosing)  Status:  Discontinued         Does not apply See admin instructions 08/01/22 1203 08/02/22 0732   07/24/22 0600  ceFAZolin (ANCEF) IVPB 2g/100 mL premix        2 g 200 mL/hr over 30 Minutes Intravenous On call to O.R. 07/23/22 2110 07/24/22 1318       REVIEW OF SYSTEMS:  Const:  fever, negative chills,  negative weight loss Eyes: negative diplopia or visual changes, negative eye pain ENT: nose bleed Resp: negative cough, hemoptysis, dyspnea Cards: negative for chest pain, palpitations, lower extremity edema GU: urinary retention lat night- had straight cath GI: Negative for abdominal pain, diarrhea, bleeding, constipation Skin: negative for rash and pruritus Heme: nose bleeding MS: low back pain Leg pain and weakness Neurolo:drowsiness Psych: negative for feelings of anxiety, depression  Endocrine: negative for thyroid, diabetes Allergy/Immunology- negative for any medication or food allergies ? 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 Eyes: Conjunctivae clear, anicteric sclerae. Pupils are equal ENT Nares normal. No drainage or sinus tenderness. Lips, mucosa, and tongue normal. No Thrush Neck:  symmetrical, no adenopathy, thyroid: non tender no carotid bruit and no JVD. Back: No CVA tenderness. Lungs: b/la air entry Heart: irregular Abdomen: Soft, non-tender,not distended. Bowel sounds normal. No masses Extremities: moves legs, moves knees Minimal swelling rt kne No warmth or redness B/l femoral site of endovascular access- clean and healing Skin: No rashes or lesions. Or bruising Lymph: Cervical, supraclavicular normal. Neurologic: Grossly non-focal Pertinent Labs Lab Results CBC    Component Value Date/Time   WBC 16.1 (H) 08/02/2022 0625   RBC 3.25 (L) 08/02/2022 0625   HGB 10.3 (L) 08/02/2022 0625   HCT 30.0 (L) 08/02/2022 0625   PLT 209 08/02/2022 0625   MCV 92.3 08/02/2022 0625   MCH 31.7 08/02/2022 0625   MCHC 34.3 08/02/2022 0625   RDW 13.8 08/02/2022 0625   LYMPHSABS 1.0 07/31/2022 0403   MONOABS 0.5 07/31/2022 0403   EOSABS 0.0 07/31/2022 0403   BASOSABS 0.0 07/31/2022 0403       Latest Ref Rng & Units 08/02/2022    6:25 AM 08/01/2022    8:57 AM 07/31/2022    4:03 AM  CMP  Glucose 70 - 99 mg/dL 409  811  914   BUN 8 - 23 mg/dL 79  59  36   Creatinine 0.61 - 1.24 mg/dL 7.82  9.56  2.13   Sodium 135 - 145 mmol/L 130  130  129   Potassium 3.5 - 5.1 mmol/L 4.3  4.8  4.7   Chloride 98 - 111 mmol/L 101  98  97   CO2 22 - 32 mmol/L 21  22  22    Calcium 8.9 - 10.3 mg/dL 7.9  8.1  8.2       Microbiology: Recent Results (from the past 240 hour(s))  Culture, blood (Routine X 2)  w Reflex to ID Panel     Status: None (Preliminary result)   Collection Time: 08/01/22 12:37 PM   Specimen: BLOOD RIGHT ARM  Result Value Ref Range Status   Specimen Description BLOOD RIGHT ARM  Final   Special Requests   Final    BOTTLES DRAWN AEROBIC AND ANAEROBIC Blood Culture adequate volume   Culture  Setup Time   Final    GRAM POSITIVE COCCI IN BOTH AEROBIC AND ANAEROBIC BOTTLES CRITICAL VALUE NOTED.  VALUE IS CONSISTENT WITH PREVIOUSLY REPORTED AND CALLED VALUE. Performed at [facility], [address], [place], [place] [address]  Culture GRAM POSITIVE COCCI  Final   Report Status PENDING  Incomplete  Culture, blood (Routine X 2) w Reflex to ID Panel     Status: None (Preliminary result)   Collection Time: 08/01/22  1:04 PM   Specimen: BLOOD RIGHT ARM  Result Value Ref Range Status   Specimen Description BLOOD RIGHT ARM  Final   Special Requests   Final    BOTTLES DRAWN AEROBIC AND ANAEROBIC Blood Culture adequate volume   Culture  Setup Time   Final    GRAM POSITIVE COCCI IN BOTH AEROBIC AND ANAEROBIC BOTTLES Organism ID to follow CRITICAL RESULT CALLED TO, READ BACK BY AND VERIFIED WITH: Elenora Gamma, PHARMD AT 1128 ON 08/01/22 BY GM Performed at [facility], [address] [place], [place] [address]    Culture GRAM POSITIVE COCCI  Final   Report Status PENDING  Incomplete  Blood Culture ID Panel (Reflexed)     Status: Abnormal   Collection Time: 08/01/22  1:04 PM  Result Value Ref Range Status   Enterococcus faecalis NOT DETECTED NOT DETECTED Final   Enterococcus Faecium NOT DETECTED NOT DETECTED Final   Listeria monocytogenes NOT DETECTED NOT DETECTED Final   Staphylococcus species DETECTED (A) NOT DETECTED Final    Comment: CRITICAL RESULT CALLED TO, READ BACK BY AND VERIFIED WITH: NATHAN BLUE, PHARMD AT 1128 ON 08/01/22 BY GM    Staphylococcus aureus (BCID) DETECTED (A) NOT DETECTED Final    Comment: CRITICAL RESULT CALLED TO, READ BACK BY  AND VERIFIED WITH: NATHAN BLUE, PHARMD AT 1128 ON 08/01/22 BY GM    Staphylococcus epidermidis NOT DETECTED NOT DETECTED Final   Staphylococcus lugdunensis NOT DETECTED NOT DETECTED Final   Streptococcus species NOT DETECTED NOT DETECTED Final   Streptococcus agalactiae NOT DETECTED NOT DETECTED Final   Streptococcus pneumoniae NOT DETECTED NOT DETECTED Final   Streptococcus pyogenes NOT DETECTED NOT DETECTED Final   A.calcoaceticus-baumannii NOT DETECTED NOT DETECTED Final   Bacteroides fragilis NOT DETECTED NOT DETECTED Final   Enterobacterales NOT DETECTED NOT DETECTED Final   Enterobacter cloacae complex NOT DETECTED NOT DETECTED Final   Escherichia coli NOT DETECTED NOT DETECTED Final   Klebsiella aerogenes NOT DETECTED NOT DETECTED Final   Klebsiella oxytoca NOT DETECTED NOT DETECTED Final   Klebsiella pneumoniae NOT DETECTED NOT DETECTED Final   Proteus species NOT DETECTED NOT DETECTED Final   Salmonella species NOT DETECTED NOT DETECTED Final   Serratia marcescens NOT DETECTED NOT DETECTED Final   Haemophilus influenzae NOT DETECTED NOT DETECTED Final   Neisseria meningitidis NOT DETECTED NOT DETECTED Final   Pseudomonas aeruginosa NOT DETECTED NOT DETECTED Final   Stenotrophomonas maltophilia NOT DETECTED NOT DETECTED Final   Candida albicans NOT DETECTED NOT DETECTED Final   Candida auris NOT DETECTED NOT DETECTED Final   Candida glabrata NOT DETECTED NOT DETECTED Final   Candida krusei NOT DETECTED NOT DETECTED Final   Candida parapsilosis NOT DETECTED NOT DETECTED Final   Candida tropicalis NOT DETECTED NOT DETECTED Final   Cryptococcus neoformans/gattii NOT DETECTED NOT DETECTED Final   Meth resistant mecA/C and MREJ NOT DETECTED NOT DETECTED Final    Comment: Performed at [facility], [address] [place], [place] [address]  SARS Coronavirus 2 by RT PCR (hospital order, performed in [facility] hospital lab) *cepheid single result test* Anterior Nasal Swab      Status: None   Collection Time: 08/01/22  2:43 PM   Specimen: Anterior Nasal Swab  Result Value Ref Range  Status   SARS Coronavirus 2 by RT PCR NEGATIVE NEGATIVE Final    Comment: (NOTE) SARS-CoV-2 target nucleic acids are NOT DETECTED.  The SARS-CoV-2 RNA is generally detectable in upper and lower respiratory specimens during the acute phase of infection. The lowest concentration of SARS-CoV-2 viral copies this assay can detect is 250 copies / mL. A negative result does not preclude SARS-CoV-2 infection and should not be used as the sole basis for treatment or other patient management decisions.  A negative result may occur with improper specimen collection / handling, submission of specimen other than nasopharyngeal swab, presence of viral mutation(s) within the areas targeted by this assay, and inadequate number of viral copies (<250 copies / mL). A negative result must be combined with clinical observations, patient history, and epidemiological information.  Fact Sheet for Patients:   RoadLapTop.co.za  Fact Sheet for Healthcare Providers: http://kim-miller.com/  This test is not yet approved or  cleared by the [place] FDA and has been authorized for detection and/or diagnosis of SARS-CoV-2 by FDA under an Emergency Use Authorization (EUA).  This EUA will remain in effect (meaning this test can be used) for the duration of the COVID-19 declaration under Section 564(b)(1) of the Act, 21 U.S.C. section 360bbb-3(b)(1), unless the authorization is terminated or revoked sooner.  Performed at [facility], [address], [place], [place] [address]   Surgical pcr screen     Status: Abnormal   Collection Time: 08/01/22  4:29 PM   Specimen: Nasal Mucosa; Nasal Swab  Result Value Ref Range Status   MRSA, PCR NEGATIVE NEGATIVE Final   Staphylococcus aureus POSITIVE (A) NEGATIVE Final    Comment: (NOTE) The Xpert SA  Assay (FDA approved for NASAL specimens in patients 18 years of age and older), is one component of a comprehensive surveillance program. It is not intended to diagnose infection nor to guide or monitor treatment. Performed at [facility], [address] [place], [place] [address]     IMAGING RESULTS: CXR   I have personally reviewed the films Bibasilar opacities ? Impression/Recommendation ?87 yr male presenting with acute onset back pain and CTA showed an penetrating atherosclerotic ulcer of the aorta thought to be the cause of back pain- on presentation on 5/18 he did not have fever or wbc- on 5/19 WBC was up to 15- but no fever- no culture sent- no mri of lumbar spine done On 5/22 endovascular procedure with placement of a 2.5 cm * 7cm with 16mm * 40mm limbs endologix aortic endoprosthesis placement thru femoral artery access. A suprarenal aortic extension cuff was also placed of 25mm*  80mm. He was given cefazolin for the procedure He had fever /rt knee pain and swelling on 5/30 and blood culture sent and is MSSA  MSSA bacteremia- hospital onset but no culture was done on admission, and he had a high wbc the next day of admission, so possible that it was brewing before Need MRI lumbar spine Need TEE On cefazolin Will repeat blood culture in 48 hrs  Encephalopathy- infection, meds   Rt knee minimal swelling - flexing the knee, no erythema or warmth- currently no obvious infection- watch closely   Afib after admission- on metoprolol and diltiazem Rate controlled  AKI worsening- r/o incomplete emptying  Anemia? ? PAD  ___________________________________________________ Discussed with family and hospitalist [facility] on call this weekend. Available by phone for urgent issue

## 2022-08-03 DIAGNOSIS — M4646 Discitis, unspecified, lumbar region: Secondary | ICD-10-CM | POA: Diagnosis not present

## 2022-08-03 DIAGNOSIS — M4626 Osteomyelitis of vertebra, lumbar region: Secondary | ICD-10-CM | POA: Insufficient documentation

## 2022-08-03 DIAGNOSIS — E872 Acidosis, unspecified: Secondary | ICD-10-CM | POA: Insufficient documentation

## 2022-08-03 DIAGNOSIS — I719 Aortic aneurysm of unspecified site, without rupture: Secondary | ICD-10-CM | POA: Diagnosis not present

## 2022-08-03 DIAGNOSIS — A4101 Sepsis due to Methicillin susceptible Staphylococcus aureus: Secondary | ICD-10-CM | POA: Diagnosis not present

## 2022-08-03 DIAGNOSIS — G061 Intraspinal abscess and granuloma: Secondary | ICD-10-CM

## 2022-08-03 DIAGNOSIS — G062 Extradural and subdural abscess, unspecified: Secondary | ICD-10-CM

## 2022-08-03 LAB — BASIC METABOLIC PANEL WITH GFR
Anion gap: 9 (ref 5–15)
BUN: 84 mg/dL — ABNORMAL HIGH (ref 8–23)
CO2: 19 mmol/L — ABNORMAL LOW (ref 22–32)
Calcium: 8 mg/dL — ABNORMAL LOW (ref 8.9–10.3)
Chloride: 103 mmol/L (ref 98–111)
Creatinine, Ser: 2.68 mg/dL — ABNORMAL HIGH (ref 0.61–1.24)
GFR, Estimated: 23 mL/min — ABNORMAL LOW
Glucose, Bld: 101 mg/dL — ABNORMAL HIGH (ref 70–99)
Potassium: 3.8 mmol/L (ref 3.5–5.1)
Sodium: 131 mmol/L — ABNORMAL LOW (ref 135–145)

## 2022-08-03 MED ORDER — SODIUM BICARBONATE 650 MG PO TABS
650.0000 mg | ORAL_TABLET | Freq: Two times a day (BID) | ORAL | Status: DC
  Administered 2022-08-03 – 2022-08-08 (×11): 650 mg via ORAL
  Filled 2022-08-03 (×11): qty 1

## 2022-08-03 NOTE — Consult Note (Signed)
Consult requested by:  Dr. Chipper Riley  Consult requested for:  Discitis/epidural abscess  Primary Physician:  Brett Norman, MD  History of Present Illness: 08/03/2022 [name] is here today with a chief complaint of back pain and difficulty walking.  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  He developed sepsis on May 30.  Due to continued back pain and right leg pain, MRI was performed showing likely infection.  I was consulted for recommendations.  He has had back pain for a couple of weeks, as well as some difficulty walking due to RLE pain.   Review of Systems:  A 10 point review of systems is negative, except for the pertinent positives and negatives detailed in the HPI.  Past Medical History: Past Medical History:  Diagnosis Date   Cancer (HCC)    skin cancer basal   Carotid arterial disease (HCC)    a. 02/2016 L CEA; b. 01/2017 U/S: patent LICA, 1-39% RICA.   Celiac artery stenosis (HCC)    Coronary artery disease    a. 01/2016 MV: mild apical/basal inferior, apical lateral, mid anterolateral, and mid inferolateral ischemia. EF 57%; b. 02/2016 Cath: LM 40ost, LAD 70p/m, 20d, D1 95 (small), LCX nl, RCA 53m, RPDA 90 (small), EF 55-65%-->med Rx. Rec CABG for recurrent symptoms.   Hearing loss    History of echocardiogram    a. 02/2016 Echo: EF 50-55%, no rwma, mild MR.   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   Osteoarthritis, hip, bilateral    Left > Right    Past Surgical History: Past Surgical History:  Procedure Laterality Date   AORTIC INTERVENTION N/A 07/24/2022   Procedure: AORTIC INTERVENTION;  Surgeon: Brett Dills, MD;  Location: [facility] INVASIVE CV LAB;  Service: Cardiovascular;  Laterality: N/A;   CARDIAC CATHETERIZATION N/A 01/19/2016   Procedure: Left Heart Cath and Coronary Angiography;  Surgeon: Brett Ouch, MD;  Location: [facility] INVASIVE CV LAB;  Service:  Cardiovascular;  Laterality: N/A;   COLONOSCOPY     CYSTOSCOPY/URETEROSCOPY/HOLMIUM LASER/STENT PLACEMENT Right 04/13/2021   Procedure: CYSTOSCOPY/URETEROSCOPY/HOLMIUM LASER/STENT PLACEMENT;  Surgeon: Brett Come, MD;  Location: [facility] ORS;  Service: Urology;  Laterality: Right;   ENDARTERECTOMY Left 02/15/2016   Procedure: ENDARTERECTOMY CAROTID;  Surgeon: Brett Needy, MD;  Location: [facility] ORS;  Service: Vascular;  Laterality: Left;    Allergies: Allergies as of 07/20/2022   (No Known Allergies)    Medications: Current Meds  Medication Sig   amLODipine (NORVASC) 5 MG tablet Take 1 tablet (5 mg total) by mouth daily.   clopidogrel (PLAVIX) 75 MG tablet TAKE 1 TABLET BY MOUTH EVERY DAY WITH BREAKFAST   lisinopril (ZESTRIL) 40 MG tablet Take 1 tablet (40 mg total) by mouth daily.   metoprolol succinate (TOPROL-XL) 100 MG 24 hr tablet TAKE 1 TABLET BY MOUTH EVERY DAY WITH A MEAL   Multiple Vitamins-Iron (MULTIVITAMINS WITH IRON) TABS Take 1 tablet by mouth daily.   rosuvastatin (CRESTOR) 10 MG tablet Take 1 tablet (10 mg total) by mouth daily.   vitamin B-12 (CYANOCOBALAMIN) 1000 MCG tablet Take 1,000 mcg by mouth daily.    Social History: Social History   Tobacco Use   Smoking status: Never   Smokeless tobacco: Never  Vaping Use   Vaping Use: Never used  Substance Use Topics   Alcohol use: No    Alcohol/week: 0.0 standard drinks  of alcohol   Drug use: No    Family Medical History: Family History  Problem Relation Age of Onset   Stroke Mother    Heart disease Father        MI   Heart attack Father    Breast cancer Sister    Colon cancer Neg Hx    Prostate cancer Neg Hx     Physical Examination: Vitals:   08/03/22 0943 08/03/22 1218  BP: (!) 150/67 134/78  Pulse: 79 71  Resp: 20 20  Temp: 98 F (36.7 C) 98.2 F (36.8 C)  SpO2: 94% 94%    General: Patient is in no apparent distress. Attention to examination is appropriate.  Neck:   Supple.  Full range of  motion.  Respiratory: Patient is breathing without any difficulty.   NEUROLOGICAL:     Awake, alert, oriented to person, place, and time.  Speech is clear and fluent.  Cranial Nerves: Pupils equal round and reactive to light.  Facial tone is symmetric.  Facial sensation is symmetric. Shoulder shrug is symmetric. Tongue protrusion is midline.    Strength: Side Biceps Triceps Deltoid Interossei Grip Wrist Ext. Wrist Flex.  R 5 5 5 5 5 5 5   L 5 5 5 5 5 5 5    Side Iliopsoas Quads Hamstring PF DF EHL  R 5 5 5 5 5 5   L 5 5 5 5 5 5       Hoffman's is absent.   Bilateral upper and lower extremity sensation is intact to light touch.    No evidence of dysmetria noted.  Gait is untested.     Medical Decision Making  Imaging: MRI L spine with and without 08/02/2022 IMPRESSION: 1. Findings concerning for discitis osteomyelitis at L1-L2, with a small ventral epidural abscess and a small amount of prevertebral phlegmon. The abscess does not cause significant spinal canal stenosis but likely narrows the right lateral recess, possibly affecting descending right L2 nerve root. 2. Increased T2 signal and hyperenhancement in the L4-L5 and L5-S1 disc spaces, concerning for early discitis. No evidence of epidural phlegmon or abscess at these levels. 3. L2-L3 possible facet septic arthritis on the left. 4. Mild neural foraminal narrowing bilaterally at L3-L4 and L4-L5 and on the right at L5-S1.   These results will be called to the ordering clinician or representative by the Radiologist Assistant, and communication documented in the PACS or [employer].     Electronically Signed   By: Brett Riley M.D.   On: 08/03/2022 02:25  I have personally reviewed the images and agree with the above interpretation.  Assessment and Plan: Brett Riley is a pleasant 87 y.o. male with epidural abscess at L1/2 and possible discitis at L4/5 and L5/S1.  He has pain but no cauda equina syndrome or  acute deficit on my exam.  - Would not recommend surgical debridement at this time - Other care per primary team and ID - OK to work with PTOT when he can tolerate it - Please contact me again if any neurosurgical issues arise    I have communicated my recommendations to the requesting physician and coordinated care to facilitate these recommendations.     Brett Riley K. Myer Haff MD, [facility] Neurosurgery

## 2022-08-03 NOTE — Progress Notes (Signed)
Progress Note   Patient: Brett Riley XBM:[id number] DOB: May 13, 1935 DOA: 07/20/2022     14 DOS: the patient was seen and examined on 08/03/2022   Brief hospital course:  ALOISE SHELLMAN is a 87 y.o. Caucasian male with medical history significant for coronary artery disease, hypertension, dyslipidemia and urolithiasis, who presented to the ER with acute onset of bilateral low back pain that started 07/20/2022 in the morning after he got out of bed.  He was barely able to stand per his wife.  He had nausea around  noon. Came to ED.  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 No aneurysm or dissection. 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  Patient had aortic ulcer repair on 5/22.  Postoperatively, patient was receiving physical therapy.  Patient becomes septic with a high fever, tachycardia and altered mental status on 5/30.  Antibiotic started with cefepime and vancomycin.  Blood culture came back with MSSA on 5/31.  Antibiotics switched to cefazolin. MRI of the lumbar spine shows significant osteomyelitis with abscess.  Neurosurgery consult obtained, not recommended surgery.    Assessment and Plan: MSSA septicemia. Lumbar spine osteomyelitis with abscess Metabolic encephalopathy. Generalized weakness. Patient spiked a fever on 5/30, met sepsis criteria with high fever, tachycardia and tachypnea.  Blood culture grew MSSA.  Antibiotics switched to cefazolin. Patient has been seen by ID.  MRI of the lumbar spine showed significant osteomyelitis/discitis with abscess. Discussed with Dr. Myer Haff, not considering the surgical intervention. TEE is scheduled on Monday. Continue cefazolin. Lower back pain is improving.  No additional fever.  However, patient still very weak, will need rehab. Repeat blood culture this morning, PICC line on Monday if repeat  cultures negative.   Acute kidney injury. Hyponatremia likely secondary to SIADH Metabolic acidosis. Renal ultrasound did not show any hydronephrosis, showed medical renal abnormality. Patient initially had urinary tension, required a 1 I&O cath.  No additional urinary retention in subsequent bladder scans. Patient renal function still not improving, I will continue fluids.  Consider nephrology consult if renal function still getting worse tomorrow. Start sodium bicarb orally. Sodium level 131, slightly improving.  Still on 1 g sodium tablet twice a day.   Right knee effusion with osteoarthritis. He was seen by orthopedics, no indication of infection.      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  Currently on aspirin and Eliquis, high dose statin.       Atrial fibrillation new onset 05/20 22:58 Heart rate is adequately controlled.  Continue anticoagulation with Eliquis.   Essential hypertension  Dyslipidemia Continue beta-blocker and statin.   Coronary artery disease Continue current treatment.         Subjective:  Patient is still very weak, but mental status is improved, no confusion.  No shortness of breath.  Had large bowel after lactulose yesterday.  Physical Exam: Vitals:   08/02/22 2027 08/02/22 2349 08/03/22 0434 08/03/22 0943  BP: 133/65 134/60 123/64 (!) 150/67  Pulse: 74 72 74 79  Resp: 18 18 18 20   Temp: 97.8 F (36.6 C) (!) 97.5 F (36.4 C) 98.4 F (36.9 C) 98 F (36.7 C)  TempSrc: Oral Oral Oral Oral  SpO2: 96% 95% 93% 94%  Weight:   92.6 kg   Height:       General  exam: Appears calm and comfortable  Respiratory system: Clear to auscultation. Respiratory effort normal. Cardiovascular system: S1 & S2 heard, RRR. No JVD, murmurs, rubs, gallops or clicks. No pedal edema. Gastrointestinal system: Abdomen is nondistended, soft and nontender. No  organomegaly or masses felt. Normal bowel sounds heard. Central nervous system: Alert and oriented x3. No focal neurological deficits. Extremities: Symmetric Skin: No rashes, lesions or ulcers Psychiatry:  Mood & affect appropriate.   Data Reviewed:  Lab results reviewed, MRI results reviewed.  Ultrasound results reviewed.  Family Communication: Wife updated at bedside.  Disposition: Status is: Inpatient Remains inpatient appropriate because: Severity of disease, IV treatment.  Planned Discharge Destination: Rehab    Time spent: 50 minutes  Author: Marrion Coy, MD 08/03/2022 12:08 PM  For on call review [email or web address].

## 2022-08-04 DIAGNOSIS — M4626 Osteomyelitis of vertebra, lumbar region: Secondary | ICD-10-CM | POA: Diagnosis not present

## 2022-08-04 DIAGNOSIS — I719 Aortic aneurysm of unspecified site, without rupture: Secondary | ICD-10-CM | POA: Diagnosis not present

## 2022-08-04 DIAGNOSIS — A4101 Sepsis due to Methicillin susceptible Staphylococcus aureus: Secondary | ICD-10-CM | POA: Diagnosis not present

## 2022-08-04 LAB — BASIC METABOLIC PANEL WITH GFR
Anion gap: 8 (ref 5–15)
BUN: 86 mg/dL — ABNORMAL HIGH (ref 8–23)
CO2: 20 mmol/L — ABNORMAL LOW (ref 22–32)
Calcium: 7.9 mg/dL — ABNORMAL LOW (ref 8.9–10.3)
Chloride: 104 mmol/L (ref 98–111)
Creatinine, Ser: 2.31 mg/dL — ABNORMAL HIGH (ref 0.61–1.24)
GFR, Estimated: 27 mL/min — ABNORMAL LOW
Glucose, Bld: 103 mg/dL — ABNORMAL HIGH (ref 70–99)
Potassium: 3.7 mmol/L (ref 3.5–5.1)
Sodium: 132 mmol/L — ABNORMAL LOW (ref 135–145)

## 2022-08-04 LAB — MAGNESIUM: Magnesium: 2.8 mg/dL — ABNORMAL HIGH (ref 1.7–2.4)

## 2022-08-04 LAB — CBC
HCT: 30.1 % — ABNORMAL LOW (ref 39.0–52.0)
Hemoglobin: 10.3 g/dL — ABNORMAL LOW (ref 13.0–17.0)
MCH: 31.6 pg (ref 26.0–34.0)
MCHC: 34.2 g/dL (ref 30.0–36.0)
MCV: 92.3 fL (ref 80.0–100.0)
Platelets: 215 10*3/uL (ref 150–400)
RBC: 3.26 MIL/uL — ABNORMAL LOW (ref 4.22–5.81)
RDW: 14 % (ref 11.5–15.5)
WBC: 10.7 10*3/uL — ABNORMAL HIGH (ref 4.0–10.5)
nRBC: 0 % (ref 0.0–0.2)

## 2022-08-04 LAB — CULTURE, BLOOD (ROUTINE X 2)
Special Requests: ADEQUATE
Special Requests: ADEQUATE

## 2022-08-04 MED ORDER — LACTULOSE 10 GM/15ML PO SOLN
20.0000 g | Freq: Once | ORAL | Status: AC
  Administered 2022-08-04: 20 g via ORAL
  Filled 2022-08-04: qty 30

## 2022-08-04 MED ORDER — OXYCODONE-ACETAMINOPHEN 5-325 MG PO TABS
0.5000 | ORAL_TABLET | ORAL | Status: DC | PRN
  Administered 2022-08-04 – 2022-08-05 (×2): 1 via ORAL
  Filled 2022-08-04 (×3): qty 1

## 2022-08-04 NOTE — Progress Notes (Signed)
Progress Note   Patient: Brett Riley:[id number] DOB: Jul 11, 1935 DOA: 07/20/2022     15 DOS: the patient was seen and examined on 08/04/2022   Brief hospital course:  Brett Riley is a 87 y.o. Caucasian male with medical history significant for coronary artery disease, hypertension, dyslipidemia and urolithiasis, who presented to the ER with acute onset of bilateral low back pain that started 07/20/2022 in the morning after he got out of bed.  He was barely able to stand per his wife.  He had nausea around  noon. Came to ED.  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 No aneurysm or dissection. 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  Patient had aortic ulcer repair on 5/22.  Postoperatively, patient was receiving physical therapy.  Patient becomes septic with a high fever, tachycardia and altered mental status on 5/30.  Antibiotic started with cefepime and vancomycin.  Blood culture came back with MSSA on 5/31.  Antibiotics switched to cefazolin. MRI of the lumbar spine shows significant osteomyelitis with abscess.  Neurosurgery consult obtained, not recommended surgery.     Principal Problem:   Penetrating atherosclerotic ulcer of aorta (HCC) Active Problems:   Uncontrolled hypertension   Coronary artery disease   Hypoxemia   Thrombocytopenia (HCC)   Preop cardiovascular exam   Atherosclerotic ulcer of aorta (HCC)   Dyslipidemia   PVD (peripheral vascular disease) (HCC)   Atrial fibrillation with rapid ventricular response (HCC)   Persistent atrial fibrillation (HCC)   Hyponatremia   Overweight (BMI 25.0-29.9)   AKI (acute kidney injury) (HCC)   Effusion of right knee   Acute metabolic encephalopathy   MSSA (methicillin susceptible Staphylococcus aureus) septicemia (HCC)   Acute osteomyelitis of lumbar spine (HCC)   Metabolic acidosis    Discitis of lumbar region   Epidural abscess   Assessment and Plan:  MSSA septicemia. Lumbar spine osteomyelitis with epidural abscess Metabolic encephalopathy. Generalized weakness. Patient spiked a fever on 5/30, met sepsis criteria with high fever, tachycardia and tachypnea.  Blood culture grew MSSA.  Antibiotics switched to cefazolin. Patient has been seen by ID.  MRI of the lumbar spine showed significant osteomyelitis/discitis with abscess. Discussed with Dr. Myer Haff, not considering the surgical intervention. TEE is scheduled on Monday. Continue cefazolin. Patient complaining worsening back pain, will start lower dose Percocet. Repeated blood culture negative in 24 hours. Will consider PICC line Monday versus Tuesday pending recovery of renal function.   Acute kidney injury. Hyponatremia likely secondary to SIADH Metabolic acidosis. Renal ultrasound did not show any hydronephrosis, showed medical renal abnormality. Patient initially had urinary tension, required a 1 I&O cath.  No additional urinary retention in subsequent bladder scans. Renal function finally improving, still has no urinary retention on bladder scan. Renal function finally improving.  Right knee effusion with osteoarthritis. He was seen by orthopedics, no indication of infection.      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  Currently on aspirin and Eliquis, high dose statin.       Atrial fibrillation new onset 05/20 22:58 Heart rate is adequately controlled.  Continue anticoagulation with Eliquis.   Essential hypertension  Dyslipidemia Continue beta-blocker and statin.   Coronary artery disease Continue current treatment.   Goal of care discussion. Due to advanced age, chronic multiple medical problems,  long-term prognosis is guarded.  Wife will talk to patient and family about CODE STATUS.  I  will obtain palliative care consult.    Subjective:  Patient is complaining of back pain, no incontinence of urine. No short of breath or cough.  Physical Exam: Vitals:   08/03/22 1912 08/03/22 2322 08/04/22 0352 08/04/22 0921  BP: (!) 155/68 127/64 (!) 134/59 (!) 170/71  Pulse: 79 78 69 79  Resp: 20 15 16 20   Temp: 98.2 F (36.8 C) 98.4 F (36.9 C) 98.1 F (36.7 C) 98 F (36.7 C)  TempSrc:      SpO2: 93% 93% 93% 94%  Weight:      Height:       General exam: Appears calm and comfortable  Respiratory system: Clear to auscultation. Respiratory effort normal. Cardiovascular system: S1 & S2 heard, RRR. No JVD, murmurs, rubs, gallops or clicks. No pedal edema. Gastrointestinal system: Abdomen is nondistended, soft and nontender. No organomegaly or masses felt. Normal bowel sounds heard. Central nervous system: Drowsy and oriented x3. No focal neurological deficits. Extremities: Symmetric 5 x 5 power. Skin: No rashes, lesions or ulcers Psychiatry: Judgement and insight appear normal. Mood & affect appropriate.    Data Reviewed:  Lab results reviewed.  Family Communication: Wife updated at bedside.  Disposition: Status is: Inpatient Remains inpatient appropriate because: Severity of disease, IV treatment.  Inpatient procedure.     Time spent: 50 minutes  Author: Marrion Coy, MD 08/04/2022 10:01 AM  For on call review [email or web address].

## 2022-08-05 ENCOUNTER — Encounter: Payer: Self-pay | Admitting: General Practice

## 2022-08-05 ENCOUNTER — Encounter
Admission: EM | Disposition: A | Discharge status: Skilled Nursing Facility | Payer: Self-pay | Source: Home / Self Care | Attending: Internal Medicine

## 2022-08-05 ENCOUNTER — Inpatient Hospital Stay (HOSPITAL_COMMUNITY)
Admit: 2022-08-05 | Discharge: 2022-08-05 | Disposition: A | Discharge status: Home / Self Care | Payer: Medicare HMO | Attending: Medical | Admitting: Medical

## 2022-08-05 DIAGNOSIS — M4646 Discitis, unspecified, lumbar region: Secondary | ICD-10-CM | POA: Diagnosis not present

## 2022-08-05 DIAGNOSIS — I719 Aortic aneurysm of unspecified site, without rupture: Secondary | ICD-10-CM | POA: Diagnosis not present

## 2022-08-05 DIAGNOSIS — M4626 Osteomyelitis of vertebra, lumbar region: Secondary | ICD-10-CM | POA: Diagnosis not present

## 2022-08-05 DIAGNOSIS — M4620 Osteomyelitis of vertebra, site unspecified: Secondary | ICD-10-CM

## 2022-08-05 DIAGNOSIS — I340 Nonrheumatic mitral (valve) insufficiency: Secondary | ICD-10-CM | POA: Diagnosis not present

## 2022-08-05 DIAGNOSIS — B9561 Methicillin susceptible Staphylococcus aureus infection as the cause of diseases classified elsewhere: Secondary | ICD-10-CM | POA: Insufficient documentation

## 2022-08-05 DIAGNOSIS — R7881 Bacteremia: Secondary | ICD-10-CM | POA: Diagnosis not present

## 2022-08-05 DIAGNOSIS — A4101 Sepsis due to Methicillin susceptible Staphylococcus aureus: Secondary | ICD-10-CM | POA: Diagnosis not present

## 2022-08-05 HISTORY — PX: TEE WITHOUT CARDIOVERSION: SHX5443

## 2022-08-05 LAB — BASIC METABOLIC PANEL WITH GFR
Anion gap: 6 (ref 5–15)
BUN: 72 mg/dL — ABNORMAL HIGH (ref 8–23)
CO2: 22 mmol/L (ref 22–32)
Calcium: 8 mg/dL — ABNORMAL LOW (ref 8.9–10.3)
Chloride: 108 mmol/L (ref 98–111)
Creatinine, Ser: 1.93 mg/dL — ABNORMAL HIGH (ref 0.61–1.24)
GFR, Estimated: 34 mL/min — ABNORMAL LOW
Glucose, Bld: 108 mg/dL — ABNORMAL HIGH (ref 70–99)
Potassium: 4.1 mmol/L (ref 3.5–5.1)
Sodium: 136 mmol/L (ref 135–145)

## 2022-08-05 SURGERY — TRANSESOPHAGEAL ECHOCARDIOGRAM (TEE)
Anesthesia: Moderate Sedation

## 2022-08-05 MED ORDER — FENTANYL CITRATE (PF) 100 MCG/2ML IJ SOLN
INTRAMUSCULAR | Status: AC
  Filled 2022-08-05: qty 2

## 2022-08-05 MED ORDER — LIDOCAINE VISCOUS HCL 2 % MT SOLN
OROMUCOSAL | Status: AC
  Filled 2022-08-05: qty 15

## 2022-08-05 MED ORDER — LIDOCAINE VISCOUS HCL 2 % MT SOLN
15.0000 mL | Freq: Once | OROMUCOSAL | Status: AC
  Administered 2022-08-05: 15 mL via OROMUCOSAL

## 2022-08-05 MED ORDER — FENTANYL CITRATE (PF) 100 MCG/2ML IJ SOLN
INTRAMUSCULAR | Status: AC | PRN
  Administered 2022-08-05 (×2): 25 ug via INTRAVENOUS

## 2022-08-05 MED ORDER — MIDAZOLAM HCL 2 MG/2ML IJ SOLN
INTRAMUSCULAR | Status: AC | PRN
  Administered 2022-08-05: 1 mg via INTRAVENOUS

## 2022-08-05 MED ORDER — CEFAZOLIN SODIUM-DEXTROSE 2-4 GM/100ML-% IV SOLN
2.0000 g | Freq: Three times a day (TID) | INTRAVENOUS | Status: DC
  Administered 2022-08-05 – 2022-08-09 (×13): 2 g via INTRAVENOUS
  Filled 2022-08-05 (×14): qty 100

## 2022-08-05 MED ORDER — SODIUM CHLORIDE 0.9 % IV SOLN: INTRAVENOUS | Status: DC

## 2022-08-05 MED ORDER — MIDAZOLAM HCL 2 MG/2ML IJ SOLN
INTRAMUSCULAR | Status: AC
  Filled 2022-08-05: qty 4

## 2022-08-05 MED ORDER — NALOXONE HCL 0.4 MG/ML IJ SOLN: 0.4000 mg | INTRAMUSCULAR | Status: DC | PRN

## 2022-08-05 NOTE — Progress Notes (Signed)
Inpatient Rehab Admissions Coordinator:    CIR continues to follow. Insurance denied, stating that Pt. Was not medically ready for d/c and they felt that rehab could be done at lower level of care. We could resubmit once Pt. Is medically stable, but I think it is unlikely that insurance would approve. Pt. Will likely need SNF. I called pt.'s wife to discuss and left VM with request for callback.   Megan Salon, MS, CCC-SLP Rehab Admissions Coordinator  [phone] (celll) [phone] (office)

## 2022-08-05 NOTE — Consult Note (Signed)
PHARMACY NOTE:  ANTIMICROBIAL RENAL DOSAGE ADJUSTMENT  Current antimicrobial regimen includes a mismatch between antimicrobial dosage and estimated renal function.  As per policy approved by the Pharmacy & Therapeutics and Medical Executive Committees, the antimicrobial dosage will be adjusted accordingly.  Current antimicrobial dosage:  ancef 2 g q12H   Indication: MSSA bacteremia  Renal Function:  Estimated Creatinine Clearance: 31.3 mL/min (A) (by C-G formula based on SCr of 1.93 mg/dL (H)). []      On intermittent HD, scheduled: []      On CRRT    Antimicrobial dosage has been changed to:  ancef 2 g q12H   Additional comments: Scr trending down. CrCl improving ( > 30 ml/min)  Thank you for allowing pharmacy to be a part of this patient's care.  Ronnald Ramp, [facility] 08/05/2022 8:24 AM

## 2022-08-05 NOTE — Procedures (Signed)
Transesophageal Echocardiogram :  Indication: bacteremia Requesting/ordering  physician:   Procedure: 10 ml of viscous lidocaine were given orally to provide local anesthesia to the oropharynx. The patient was positioned supine on the left side, bite block provided. The patient was moderately sedated with the doses of versed and fentanyl as detailed below.  Using digital technique an omniplane probe was advanced into the esophagus without incident.   Moderate sedation: 1. Sedation used:  Versed: 1mg , Fentanyl: 2. Time administered:  7:31   Time when patient started recovery: 7:53 3. I was face to face during this time 22 mins  See report in EPIC  for complete details: In brief, imaging revealed normal LV function with no RWMAs and no mural apical thrombus.  .  Estimated ejection fraction was 60%.  Right sided cardiac chambers were normal with no evidence of pulmonary hypertension.  There was no evidence for vegetation or endocarditis.  Imaging of the septum showed no ASD or VSD 2D and color flow confirmed no PFO  The LA was well visualized in orthogonal views.  There was no spontaneous contrast and no thrombus in the LA and LA appendage   Conclusion No evidence for endocarditis   Debbe Odea 08/05/2022 7:59 AM

## 2022-08-05 NOTE — Progress Notes (Signed)
*  PRELIMINARY RESULTS* Echocardiogram Echocardiogram Transesophageal has been performed.  Cristela Blue 08/05/2022, 8:08 AM

## 2022-08-05 NOTE — Progress Notes (Signed)
Physical Therapy Treatment Patient Details Name: Brett Riley MRN: [id number] DOB: [birth year] Today's Date: 08/05/2022   History of Present Illness 87 y.o. male with a past medical history of coronary artery disease, carotid artery disease status post left carotid endarterectomy, PAD, hypertension, hyperlipidemia, who has been seen and evaluated for cardiac restratification for vascular repair of penetrating aortic ulcer and new onset atrial fibrillation.    PT Comments    Pt agreeable to therapy on the third attempt of the day.  Pt requires assistance with bed mobility specifically with navigation of the LE's.  Pt does have some painful symptom response when performing mobility noticed by grimace during transition.  Pt performed did better with standing during today's session, requiring maxA from this therapist only, not requiring +2.  Pt does have difficulty coming completely upright and tucking glutes in, but otherwise stood ~35-40 seconds.  Pt repeated that 2 separate attempts and performed well.  Pt did require assistance getting back into bed and nursing assisted with donning lidocaine patch prior to assisting therapist transfer to head of bed.  Pt left with spouse in the room and all needs met.  Call bell within reach as well.     Recommendations for follow up therapy are one component of a multi-disciplinary discharge planning process, led by the attending physician.  Recommendations may be updated based on patient status, additional functional criteria and insurance authorization.     Assistance Recommended at Discharge Frequent or constant Supervision/Assistance  Patient can return home with the following Two people to help with walking and/or transfers;Two people to help with bathing/dressing/bathroom;Assistance with cooking/housework;Assist for transportation;Help with stairs or ramp for entrance   Equipment Recommendations       Recommendations for Other Services        Precautions / Restrictions Precautions Precautions: Fall Restrictions Weight Bearing Restrictions: No     Mobility  Bed Mobility Overal bed mobility: Needs Assistance Bed Mobility: Supine to Sit, Sit to Supine     Supine to sit: Mod assist Sit to supine: Mod assist   General bed mobility comments: posterior lean noted throughout with assistance and time able to progress to sitting with CGA and BUE support    Transfers Overall transfer level: Needs assistance Equipment used: Rolling walker (2 wheels) Transfers: Sit to/from Stand, Bed to chair/wheelchair/BSC Sit to Stand: Max assist                Ambulation/Gait               General Gait Details: deferred at this time due to inability to stand upright >30 sec at a time.   Stairs             Wheelchair Mobility    Modified Rankin (Stroke Patients Only)       Balance                                            Cognition Arousal/Alertness: Awake/alert Behavior During Therapy: WFL for tasks assessed/performed Overall Cognitive Status: Within Functional Limits for tasks assessed                                 General Comments: HOH        Exercises      General Comments  Pertinent Vitals/Pain Pain Assessment Pain Assessment: Faces Faces Pain Scale: Hurts little more Pain Location: upper back/neck; lower back Pain Descriptors / Indicators: Aching Pain Intervention(s): Limited activity within patient's tolerance, Monitored during session    Home Living                          Prior Function            PT Goals (current goals can now be found in the care plan section) Acute Rehab PT Goals Patient Stated Goal: get stronger PT Goal Formulation: With patient/family Time For Goal Achievement: 08/09/22 Potential to Achieve Goals: Fair Progress towards PT goals: Progressing toward goals    Frequency    Min 4X/week      PT  Plan Current plan remains appropriate    Co-evaluation              AM-PAC PT "6 Clicks" Mobility   Outcome Measure  Help needed turning from your back to your side while in a flat bed without using bedrails?: A Lot Help needed moving from lying on your back to sitting on the side of a flat bed without using bedrails?: A Lot Help needed moving to and from a bed to a chair (including a wheelchair)?: A Lot Help needed standing up from a chair using your arms (e.g., wheelchair or bedside chair)?: Total Help needed to walk in hospital room?: Total Help needed climbing 3-5 steps with a railing? : Total 6 Click Score: 9    End of Session Equipment Utilized During Treatment: Gait belt Activity Tolerance: Patient limited by pain Patient left: with chair alarm set;in chair;with call bell/phone within reach;Other (comment) Nurse Communication: Mobility status PT Visit Diagnosis: Muscle weakness (generalized) (M62.81);Difficulty in walking, not elsewhere classified (R26.2)     Time: 9147-8295 PT Time Calculation (min) (ACUTE ONLY): 31 min  Charges:  $Therapeutic Activity: 23-37 mins                     Nolon Bussing, PT, DPT Physical Therapist - [place]  [facility]  08/05/22, 3:12 PM

## 2022-08-05 NOTE — Progress Notes (Signed)
PT Cancellation Note  Patient Details Name: Brett Riley MRN: [id number] DOB: November 04, 1935   Cancelled Treatment:    Reason Eval/Treat Not Completed: Other (comment).  Attempted to see pt.  Pt has not had breakfast due to procedure this AM.  Pt and spouse requesting to hold for therapy at this time.  Will re-attempt after lunch as time allows.     Nolon Bussing, PT, DPT Physical Therapist - [facility]  08/05/22, 10:47 AM

## 2022-08-05 NOTE — Progress Notes (Addendum)
Progress Note   Patient: Brett Riley [name] DOB: [birth year] DOA: 07/20/2022     16 DOS: the patient was seen and examined on 08/05/2022   Brief hospital course:  Brett Riley is a 87 y.o. Caucasian male with medical history significant for coronary artery disease, hypertension, dyslipidemia and urolithiasis, who presented to the ER with acute onset of bilateral low back pain that started 07/20/2022 in the morning after he got out of bed.  He was barely able to stand per his wife.  He had nausea around  noon. Came to ED.  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 No aneurysm or dissection. 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  Patient had aortic ulcer repair on 5/22.  Postoperatively, patient was receiving physical therapy.  Patient becomes septic with a high fever, tachycardia and altered mental status on 5/30.  Antibiotic started with cefepime and vancomycin.  Blood culture came back with MSSA on 5/31.  Antibiotics switched to cefazolin. MRI of the lumbar spine shows significant osteomyelitis with abscess.  Neurosurgery consult obtained, not recommended surgery. TEE was performed on 6/3, no evidence of endocarditis.     Principal Problem:   Penetrating atherosclerotic ulcer of aorta (HCC) Active Problems:   Uncontrolled hypertension   Coronary artery disease   Hypoxemia   Thrombocytopenia (HCC)   Preop cardiovascular exam   Atherosclerotic ulcer of aorta (HCC)   Dyslipidemia   PVD (peripheral vascular disease) (HCC)   Atrial fibrillation with rapid ventricular response (HCC)   Persistent atrial fibrillation (HCC)   Hyponatremia   Overweight (BMI 25.0-29.9)   AKI (acute kidney injury) (HCC)   Effusion of right knee   Acute metabolic encephalopathy   MSSA (methicillin susceptible Staphylococcus aureus) septicemia (HCC)   Acute  osteomyelitis of lumbar spine (HCC)   Metabolic acidosis   Discitis of lumbar region   Epidural abscess   Bacteremia   Assessment and Plan:  MSSA septicemia. Lumbar spine osteomyelitis with epidural abscess Metabolic encephalopathy. Generalized weakness. Patient spiked a fever on 5/30, met sepsis criteria with high fever, tachycardia and tachypnea.  Blood culture grew MSSA.  Antibiotics switched to cefazolin. Patient has been seen by ID.  MRI of the lumbar spine showed significant osteomyelitis/discitis with abscess. Discussed with Dr. Myer Haff, not considering the surgical intervention. TEE showed no evidence of endocarditis. Continue cefazolin. Repeated blood culture negative in 48 hours. Patient will need a PICC line for long-term IV antibiotics.  Renal function still is improving, PICC line before discharge.   Acute kidney injury. Hyponatremia likely secondary to SIADH Metabolic acidosis. Renal ultrasound did not show any hydronephrosis, showed medical renal abnormality. Patient initially had urinary tension, required a 1 I&O cath.  No additional urinary retention in subsequent bladder scans. Renal function continued to improve, sodium level stable.  Continue to follow.   Right knee effusion with osteoarthritis. He was seen by orthopedics, no indication of infection.      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  Currently on aspirin and Eliquis, high dose statin.       Atrial fibrillation new onset 05/20 22:58 Heart rate is adequately controlled.  Continue anticoagulation with Eliquis.   Essential hypertension  Dyslipidemia Continue beta-blocker and statin.   Coronary artery disease Continue current treatment.   Code status.  Wife  had discussed with patient and family, elected to be DNR.     Subjective:  Patient feel much better today, good appetite, had a  bowel movement. Not short of breath.  Pain under control.  Physical Exam: Vitals:   08/05/22 0755 08/05/22 0800 08/05/22 0815 08/05/22 0830  BP: (!) 153/67 (!) 150/70 (!) 147/60 (!) 147/65  Pulse: 70 71 69 67  Resp: 15 13 14 15   Temp:      TempSrc:      SpO2: 93% 95% 94% 95%  Weight:      Height:       General exam: Appears calm and comfortable  Respiratory system: Clear to auscultation. Respiratory effort normal. Cardiovascular system: S1 & S2 heard, RRR. No JVD, murmurs, rubs, gallops or clicks. No pedal edema. Gastrointestinal system: Abdomen is nondistended, soft and nontender. No organomegaly or masses felt. Normal bowel sounds heard. Central nervous system: Alert and oriented x3. No focal neurological deficits. Extremities: Symmetric 5 x 5 power. Skin: No rashes, lesions or ulcers Psychiatry:  Mood & affect appropriate.    Data Reviewed:  Lab results reviewed, TEE results reviewed.  Family Communication: Wife updated at bedside.  Disposition: Status is: Inpatient Remains inpatient appropriate because: Severity of disease, IV treatment.     Time spent: 35 minutes  Author: Marrion Coy, MD 08/05/2022 11:32 AM  For on call review [email or web address].

## 2022-08-05 NOTE — Progress Notes (Signed)
Date of Admission:  07/20/2022     ID: Brett Riley is a 87 y.o. male Principal Problem:   Penetrating atherosclerotic ulcer of aorta (HCC) Active Problems:   Thrombocytopenia (HCC)   Preop cardiovascular exam   Atherosclerotic ulcer of aorta (HCC)   Uncontrolled hypertension   Dyslipidemia   Coronary artery disease   Hypoxemia   PVD (peripheral vascular disease) (HCC)   Atrial fibrillation with rapid ventricular response (HCC)   Persistent atrial fibrillation (HCC)   Hyponatremia   Overweight (BMI 25.0-29.9)   AKI (acute kidney injury) (HCC)   Effusion of right knee   Acute metabolic encephalopathy   MSSA (methicillin susceptible Staphylococcus aureus) septicemia (HCC)   Acute osteomyelitis of lumbar spine (HCC)   Metabolic acidosis   Discitis of lumbar region   Epidural abscess   Bacteremia    Subjective: Pt had TEE this morning and was doing well after that as per wife- was participating in PT Now he has been sleeping for the past 2 hrs Had received fentanyl for TEE  Medications:   amiodarone  200 mg Oral BID   apixaban  2.5 mg Oral BID   aspirin EC  81 mg Oral Daily   Chlorhexidine Gluconate Cloth  6 each Topical Daily   cyanocobalamin  1,000 mcg Oral Daily   diltiazem  360 mg Oral Daily   feeding supplement  237 mL Oral BID BM   lidocaine  1 patch Transdermal Q24H   lidocaine  1 patch Transdermal Q24H   metoprolol succinate  50 mg Oral BID   multivitamin with minerals  1 tablet Oral Daily   mupirocin ointment  1 Application Nasal BID   pantoprazole  40 mg Oral Daily   rosuvastatin  40 mg Oral Daily   senna-docusate  2 tablet Oral BID   sodium bicarbonate  650 mg Oral BID   sodium chloride  1 g Oral BID WC    Objective: Vital signs in last 24 hours: Patient Vitals for the past 24 hrs:  BP Temp Temp src Pulse Resp SpO2 Height Weight  08/05/22 1614 (!) 159/70 99.2 F (37.3 C) Oral 77 14 94 % -- --  08/05/22 1253 (!) 159/81 98.5 F (36.9 C) Oral 79  14 93 % -- --  08/05/22 0830 (!) 147/65 -- -- 67 15 95 % -- --  08/05/22 0815 (!) 147/60 -- -- 69 14 94 % -- --  08/05/22 0800 (!) 150/70 -- -- 71 13 95 % -- --  08/05/22 0755 (!) 153/67 -- -- 70 15 93 % -- --  08/05/22 0750 (!) 155/69 -- -- 70 14 93 % -- --  08/05/22 0745 (!) 162/65 -- -- 75 16 93 % -- --  08/05/22 0740 (!) 154/65 -- -- 71 15 94 % -- --  08/05/22 0723 (!) 158/65 98.6 F (37 C) Oral 72 14 90 % 6' (1.829 m) 92.6 kg  08/05/22 0354 (!) 150/65 98.3 F (36.8 C) Oral 76 18 93 % -- --  08/04/22 2331 132/62 98.3 F (36.8 C) -- 72 18 94 % -- --  08/04/22 2039 (!) 151/67 98.1 F (36.7 C) -- 79 18 96 % -- --  08/04/22 1749 (!) 156/71 98.7 F (37.1 C) -- 76 20 96 % -- --      PHYSICAL EXAM:  General: somnolent, on calling his name he opened his eyes and answered some questions appropriately and went back to sleeping Pupils equal and semi dilated Lungs:b/la ir entry  Heart: s1s2 Abdomen: Soft, non-tender,not distended. Bowel sounds normal. No masses Extremities: atraumatic, no cyanosis. No edema. No clubbing Skin: No rashes or lesions. Or bruising Lymph: Cervical, supraclavicular normal. Neurologic: cannot examine in detail  Lab Results    Latest Ref Rng & Units 08/04/2022    6:16 AM 08/02/2022    6:25 AM 08/01/2022    8:57 AM  CBC  WBC 4.0 - 10.5 K/uL 10.7  16.1  13.0   Hemoglobin 13.0 - 17.0 g/dL 16.1  09.6  04.5   Hematocrit 39.0 - 52.0 % 30.1  30.0  31.7   Platelets 150 - 400 K/uL 215  209  226        Latest Ref Rng & Units 08/05/2022    6:22 AM 08/04/2022    6:16 AM 08/03/2022    6:21 AM  CMP  Glucose 70 - 99 mg/dL 409  811  914   BUN 8 - 23 mg/dL 72  86  84   Creatinine 0.61 - 1.24 mg/dL 7.82  9.56  2.13   Sodium 135 - 145 mmol/L 136  132  131   Potassium 3.5 - 5.1 mmol/L 4.1  3.7  3.8   Chloride 98 - 111 mmol/L 108  104  103   CO2 22 - 32 mmol/L 22  20  19    Calcium 8.9 - 10.3 mg/dL 8.0  7.9  8.0       Microbiology: BC-08/01/22  MSSA 08/03/22 BC NG so  far  Studies/Results: MRI lumbar spine L1-L2 concerning for discitis and Osteomyelitis Small ventral epidural abscess ? L4-L5 and L5-s1 early discitis L2-L3 possible facet joint septic arthritis left   Assessment/Plan: 87 yr male presenting with acute onset back pain and CTA showed an penetrating atherosclerotic ulcer of the aorta thought to be the cause of back pain- on presentation on 5/18 he did not have fever or wbc- on 5/19 WBC was up to 15- but no fever- no culture sent- no mri of lumbar spine done On 5/22 endovascular procedure with placement of a 2.5 cm * 7cm with 16mm * 40mm limbs endologix aortic endoprosthesis placement thru femoral artery access. A suprarenal aortic extension cuff was also placed of 25mm*  80mm. He was given cefazolin for the procedure He had fever /rt knee pain and swelling on 5/30 and blood culture sent and is MSSA   MSSA bacteremia-  no culture was done on admission, and he had a high wbc the next day of admission, so possible that it was brewing before admission MRI lumbar spine shows L-L2 discitis /osteo and small ventral phlegmon L2-L3 left facet joint septic arthritis ? L4-L5/L5-s1 disicits  TEE no endocarditis  On cefazolin Repeat blood culture neg Will need for 6 weeks  09/13/22   Encephalopathy- , meds     Rt knee minimal swelling - flexing the knee, no erythema or warmth- currently no obvious infection- watch closely     Afib after admission- on metoprolol and diltiazem Rate controlled   AKI improving   Anemia? ? PAD   Discussed the management with wife at bed side and care team

## 2022-08-05 NOTE — Progress Notes (Signed)
Occupational Therapy Treatment Patient Details Name: Brett Riley MRN: [id number] DOB: 1935/07/08 Today's Date: 08/05/2022   History of present illness 87 y.o. male with a past medical history of coronary artery disease, carotid artery disease status post left carotid endarterectomy, PAD, hypertension, hyperlipidemia, who has been seen and evaluated for cardiac restratification for vascular repair of penetrating aortic ulcer and new onset atrial fibrillation.   OT comments  Brett Riley was seen for OT treatment on this date. Upon arrival to room pt reclined in bed, agreeable to tx. Pt requires MOD A for bed mobility. Defers standing citing back pain, RN in to address. Educated on IS use and HEP. Pt making limited progress toward goals. Discharge recommendation and frequency updated.    Recommendations for follow up therapy are one component of a multi-disciplinary discharge planning process, led by the attending physician.  Recommendations may be updated based on patient status, additional functional criteria and insurance authorization.    Assistance Recommended at Discharge Intermittent Supervision/Assistance  Patient can return home with the following  A lot of help with bathing/dressing/bathroom;Assistance with cooking/housework;Assist for transportation;Help with stairs or ramp for entrance;Two people to help with walking and/or transfers   Equipment Recommendations  Other (comment) (defer)    Recommendations for Other Services      Precautions / Restrictions Precautions Precautions: Fall Restrictions Weight Bearing Restrictions: No       Mobility Bed Mobility Overal bed mobility: Needs Assistance Bed Mobility: Supine to Sit, Sit to Supine     Supine to sit: Mod assist Sit to supine: Max assist        Transfers                   General transfer comment: pt defers citing back pain     Balance Overall balance assessment: Needs assistance Sitting-balance  support: Feet supported, Bilateral upper extremity supported Sitting balance-Leahy Scale: Fair Sitting balance - Comments: poor tolerance                                   ADL either performed or assessed with clinical judgement   ADL Overall ADL's : Needs assistance/impaired                                       General ADL Comments: MAX A to wash back seated EOB.      Cognition Arousal/Alertness: Awake/alert Behavior During Therapy: WFL for tasks assessed/performed Overall Cognitive Status: Within Functional Limits for tasks assessed                                 General Comments: HOH                   Pertinent Vitals/ Pain       Pain Assessment Pain Assessment: 0-10 Pain Score: 6  Pain Location: "across whole back" Pain Descriptors / Indicators: Guarding, Discomfort, Grimacing, Aching Pain Intervention(s): Limited activity within patient's tolerance, Repositioned   Frequency  Min 1X/week        Progress Toward Goals  OT Goals(current goals can now be found in the care plan section)  Progress towards OT goals: Progressing toward goals  Acute Rehab OT Goals Patient Stated Goal: to go to rehab OT Goal Formulation: With  patient/family Time For Goal Achievement: 08/09/22 Potential to Achieve Goals: Good ADL Goals Pt Will Perform Grooming: with min assist;standing Pt Will Perform Lower Body Dressing: with min assist;sit to/from stand Pt Will Transfer to Toilet: with min assist;ambulating Pt Will Perform Toileting - Clothing Manipulation and hygiene: with min assist;sit to/from stand  Plan Discharge plan needs to be updated;Frequency needs to be updated    Co-evaluation                 AM-PAC OT "6 Clicks" Daily Activity     Outcome Measure   Help from another person eating meals?: A Little Help from another person taking care of personal grooming?: A Little Help from another person toileting,  which includes using toliet, bedpan, or urinal?: A Lot Help from another person bathing (including washing, rinsing, drying)?: A Lot Help from another person to put on and taking off regular upper body clothing?: A Little Help from another person to put on and taking off regular lower body clothing?: A Lot 6 Click Score: 15    End of Session Equipment Utilized During Treatment: Rolling walker (2 wheels)  OT Visit Diagnosis: Unsteadiness on feet (R26.81);Muscle weakness (generalized) (M62.81)   Activity Tolerance Patient tolerated treatment well   Patient Left in bed;with call bell/phone within reach;with bed alarm set;with nursing/sitter in room;with family/visitor present   Nurse Communication Patient requests pain meds        Time: 1610-9604 OT Time Calculation (min): 16 min  Charges: OT General Charges $OT Visit: 1 Visit OT Treatments $Self Care/Home Management : 8-22 mins  Kathie Dike, M.S. OTR/L  08/05/22, 3:42 PM  ascom [phone]

## 2022-08-05 NOTE — Progress Notes (Signed)
Patient has been very sleepy since noon. Last dose of percocet was 5am, he did receive 2 doses of fentanyl during TEE. Patient currently has no evidence of hypoxia, will consider narcan if does not wake up in 2 hours.

## 2022-08-06 ENCOUNTER — Encounter: Payer: Self-pay | Admitting: Cardiology

## 2022-08-06 ENCOUNTER — Other Ambulatory Visit: Payer: Self-pay

## 2022-08-06 DIAGNOSIS — M4626 Osteomyelitis of vertebra, lumbar region: Secondary | ICD-10-CM | POA: Diagnosis not present

## 2022-08-06 DIAGNOSIS — I719 Aortic aneurysm of unspecified site, without rupture: Secondary | ICD-10-CM | POA: Diagnosis not present

## 2022-08-06 DIAGNOSIS — Z7189 Other specified counseling: Secondary | ICD-10-CM

## 2022-08-06 DIAGNOSIS — M4620 Osteomyelitis of vertebra, site unspecified: Secondary | ICD-10-CM

## 2022-08-06 DIAGNOSIS — M4646 Discitis, unspecified, lumbar region: Secondary | ICD-10-CM | POA: Diagnosis not present

## 2022-08-06 DIAGNOSIS — R7881 Bacteremia: Secondary | ICD-10-CM | POA: Diagnosis not present

## 2022-08-06 DIAGNOSIS — A4101 Sepsis due to Methicillin susceptible Staphylococcus aureus: Secondary | ICD-10-CM | POA: Diagnosis not present

## 2022-08-06 DIAGNOSIS — B9561 Methicillin susceptible Staphylococcus aureus infection as the cause of diseases classified elsewhere: Secondary | ICD-10-CM | POA: Diagnosis not present

## 2022-08-06 LAB — CBC
HCT: 27.6 % — ABNORMAL LOW (ref 39.0–52.0)
Hemoglobin: 9.4 g/dL — ABNORMAL LOW (ref 13.0–17.0)
MCH: 31.6 pg (ref 26.0–34.0)
MCHC: 34.1 g/dL (ref 30.0–36.0)
MCV: 92.9 fL (ref 80.0–100.0)
Platelets: 204 10*3/uL (ref 150–400)
RBC: 2.97 MIL/uL — ABNORMAL LOW (ref 4.22–5.81)
RDW: 14 % (ref 11.5–15.5)
WBC: 8.3 10*3/uL (ref 4.0–10.5)
nRBC: 0 % (ref 0.0–0.2)

## 2022-08-06 LAB — ECHO TEE

## 2022-08-06 LAB — BASIC METABOLIC PANEL WITH GFR
Anion gap: 7 (ref 5–15)
BUN: 62 mg/dL — ABNORMAL HIGH (ref 8–23)
CO2: 21 mmol/L — ABNORMAL LOW (ref 22–32)
Calcium: 8 mg/dL — ABNORMAL LOW (ref 8.9–10.3)
Chloride: 108 mmol/L (ref 98–111)
Creatinine, Ser: 1.71 mg/dL — ABNORMAL HIGH (ref 0.61–1.24)
GFR, Estimated: 39 mL/min — ABNORMAL LOW
Glucose, Bld: 120 mg/dL — ABNORMAL HIGH (ref 70–99)
Potassium: 4 mmol/L (ref 3.5–5.1)
Sodium: 136 mmol/L (ref 135–145)

## 2022-08-06 LAB — MAGNESIUM: Magnesium: 2.4 mg/dL (ref 1.7–2.4)

## 2022-08-06 MED ORDER — SODIUM CHLORIDE 0.9% FLUSH: 10.0000 mL | INTRAVENOUS | Status: DC | PRN

## 2022-08-06 MED ORDER — OXYCODONE HCL 5 MG PO TABS
2.5000 mg | ORAL_TABLET | ORAL | Status: DC | PRN
  Administered 2022-08-06 (×2): 2.5 mg via ORAL
  Filled 2022-08-06 (×2): qty 1

## 2022-08-06 MED ORDER — SODIUM CHLORIDE 0.9% FLUSH
10.0000 mL | Freq: Two times a day (BID) | INTRAVENOUS | Status: DC
  Administered 2022-08-06 – 2022-08-09 (×6): 10 mL

## 2022-08-06 NOTE — Progress Notes (Signed)
Progress Note   Patient: Brett Riley:[id number] DOB: 1935/04/29 DOA: 07/20/2022     17 DOS: the patient was seen and examined on 08/06/2022   Brief hospital course:  Brett Riley is a 87 y.o. Caucasian male with medical history significant for coronary artery disease, hypertension, dyslipidemia and urolithiasis, who presented to the ER with acute onset of bilateral low back pain that started 07/20/2022 in the morning after he got out of bed.  He was barely able to stand per his wife.  He had nausea around  noon. Came to ED.  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 No aneurysm or dissection. 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  Patient had aortic ulcer repair on 5/22.  Postoperatively, patient was receiving physical therapy.  Patient becomes septic with a high fever, tachycardia and altered mental status on 5/30.  Antibiotic started with cefepime and vancomycin.  Blood culture came back with MSSA on 5/31.  Antibiotics switched to cefazolin. MRI of the lumbar spine shows significant osteomyelitis with abscess.  Neurosurgery consult obtained, not recommended surgery. TEE was performed on 6/3, no evidence of endocarditis.     Principal Problem:   Penetrating atherosclerotic ulcer of aorta (HCC) Active Problems:   Uncontrolled hypertension   Coronary artery disease   Hypoxemia   Thrombocytopenia (HCC)   Preop cardiovascular exam   Atherosclerotic ulcer of aorta (HCC)   Dyslipidemia   PVD (peripheral vascular disease) (HCC)   Atrial fibrillation with rapid ventricular response (HCC)   Persistent atrial fibrillation (HCC)   Hyponatremia   Overweight (BMI 25.0-29.9)   AKI (acute kidney injury) (HCC)   Effusion of right knee   Acute metabolic encephalopathy   MSSA (methicillin susceptible Staphylococcus aureus) septicemia (HCC)   Acute  osteomyelitis of lumbar spine (HCC)   Metabolic acidosis   Discitis of lumbar region   Epidural abscess   MSSA bacteremia   Vertebral osteomyelitis (HCC)   Assessment and Plan: MSSA septicemia. Lumbar spine osteomyelitis with epidural abscess Metabolic encephalopathy. Generalized weakness. Patient spiked a fever on 5/30, met sepsis criteria with high fever, tachycardia and tachypnea.  Blood culture grew MSSA.  Antibiotics switched to cefazolin. Patient has been seen by ID.  MRI of the lumbar spine showed significant osteomyelitis/discitis with abscess. Discussed with Dr. Myer Haff, not considering the surgical intervention. TEE showed no evidence of endocarditis. Continue cefazolin. Repeated blood culture came back negative after 3 days. Patient will need long-term antibiotics.  Discussed with Dr. Ludwig Lean, patient is unlikely to be a candidate for AV fistula.  Okay to place PICC line with current renal function.   Acute kidney injury. Hyponatremia likely secondary to SIADH Metabolic acidosis. Renal ultrasound did not show any hydronephrosis, showed medical renal abnormality. Patient initially had urinary tension, required a 1 I&O cath.  No additional urinary retention in subsequent bladder scans. Renal function continued to improve, sodium level also improved.  Discontinue fluids.  Follow renal function. Patient still on sodium bicarb orally for mild metabolic acidosis.   Right knee effusion with osteoarthritis. He was seen by orthopedics, no indication of infection.      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  Currently on aspirin and Eliquis, high dose statin.       Atrial fibrillation new onset 05/20 22:58 Heart rate is  adequately controlled.  Continue anticoagulation with Eliquis.   Essential hypertension  Dyslipidemia Continue beta-blocker and statin.   Coronary  artery disease Continue current treatment.      Subjective:  Patient doing better today, no confusion.  No shortness of breath.  Appetite improving, no diarrhea.  Physical Exam: Vitals:   08/06/22 0433 08/06/22 0517 08/06/22 0834 08/06/22 1204  BP:  (!) 159/68 (!) 169/73 (!) 158/68  Pulse:  72 79 70  Resp:   18 18  Temp:   98 F (36.7 C) 98.3 F (36.8 C)  TempSrc:    Oral  SpO2:   93% 92%  Weight: 94.4 kg     Height:       General exam: Appears calm and comfortable  Respiratory system: Clear to auscultation. Respiratory effort normal. Cardiovascular system: S1 & S2 heard, RRR. No JVD, murmurs, rubs, gallops or clicks. No pedal edema. Gastrointestinal system: Abdomen is nondistended, soft and nontender. No organomegaly or masses felt. Normal bowel sounds heard. Central nervous system: Alert and oriented x3. No focal neurological deficits. Extremities: Symmetric 5 x 5 power. Skin: No rashes, lesions or ulcers Psychiatry: Mood & affect appropriate.    Data Reviewed:  Review lab results.  Family Communication: Wife updated.  Disposition: Status is: Inpatient Remains inpatient appropriate because: Severity of disease, IV treatment.     Time spent: 50 minutes  Author: Marrion Coy, MD 08/06/2022 1:18 PM  For on call review [email or web address].

## 2022-08-06 NOTE — Progress Notes (Signed)
Peripherally Inserted Central Catheter Placement  The IV Nurse has discussed with the patient and/or persons authorized to consent for the patient, the purpose of this procedure and the potential benefits and risks involved with this procedure.  The benefits include less needle sticks, lab draws from the catheter, and the patient may be discharged home with the catheter. Risks include, but not limited to, infection, bleeding, blood clot (thrombus formation), and puncture of an artery; nerve damage and irregular heartbeat and possibility to perform a PICC exchange if needed/ordered by physician.  Alternatives to this procedure were also discussed.  Bard Power PICC patient education guide, fact sheet on infection prevention and patient information card has been provided to patient /or left at bedside.    PICC Placement Documentation  PICC Single Lumen 08/06/22 Right Brachial 39 cm 0 cm (Active)  Indication for Insertion or Continuance of Line Prolonged intravenous therapies 08/06/22 1615  Exposed Catheter (cm) 0 cm 08/06/22 1615  Site Assessment Clean, Dry, Intact 08/06/22 1615  Line Status Flushed;Saline locked;Blood return noted 08/06/22 1615  Dressing Type Transparent;Securing device 08/06/22 1615  Dressing Status Antimicrobial disc in place;Clean, Dry, Intact 08/06/22 1615  Safety Lock Not Applicable 08/06/22 1615  Line Care Connections checked and tightened 08/06/22 1615  Line Adjustment (NICU/IV Team Only) No 08/06/22 1615  Dressing Intervention New dressing;Other (Comment) 08/06/22 1615  Dressing Change Due 08/13/22 08/06/22 1615       Annett Fabian 08/06/2022, 4:16 PM

## 2022-08-06 NOTE — Treatment Plan (Addendum)
Diagnosis: MSSA bacteremia Lumbar discitis and osteomyelitis Baseline Creatinine 1.71    No Known Allergies  OPAT Orders Discharge antibiotics: Cefazolin 2 grams IV every 8 hours for 6 weeks End Date: 09/13/22  [facility] Care Per Protocol:  Labs weekly on Monday while on IV antibiotics: _X_ CBC with differential  _X_ CMP __X CRP X__ ESR   _X_ Please pull PIC at completion of IV antibiotics   Fax weekly lab results  promptly to [phone]  Clinic Follow Up Appt: Dr,.Jozee Hammer 08/27/22 at 10.45AM   Call [phone] with any questions or critical value

## 2022-08-06 NOTE — Progress Notes (Signed)
Inpatient Rehab Admissions Coordinator:    CIR following at a distance. Insurance denied partly because he was not medically stable, but also stated that they felt his care could be managed at SNF. I relayed this to wife who is considering SNF vs resubmitting case for CIR once pt. Is medically stable. I asked TOC to talk to Pt. And family about SNF in [place].   Megan Salon, MS, CCC-SLP Rehab Admissions Coordinator  [phone] (celll) [phone] (office)

## 2022-08-06 NOTE — Progress Notes (Signed)
Date of Admission:  07/20/2022     ID: Brett Riley is a 87 y.o. male Principal Problem:   Penetrating atherosclerotic ulcer of aorta (HCC) Active Problems:   Thrombocytopenia (HCC)   Preop cardiovascular exam   Atherosclerotic ulcer of aorta (HCC)   Uncontrolled hypertension   Dyslipidemia   Coronary artery disease   Hypoxemia   PVD (peripheral vascular disease) (HCC)   Atrial fibrillation with rapid ventricular response (HCC)   Persistent atrial fibrillation (HCC)   Hyponatremia   Overweight (BMI 25.0-29.9)   AKI (acute kidney injury) (HCC)   Effusion of right knee   Acute metabolic encephalopathy   MSSA (methicillin susceptible Staphylococcus aureus) septicemia (HCC)   Acute osteomyelitis of lumbar spine (HCC)   Metabolic acidosis   Discitis of lumbar region   Epidural abscess   MSSA bacteremia   Vertebral osteomyelitis (HCC)    Subjective: Awake and alert  Medications:   amiodarone  200 mg Oral BID   apixaban  2.5 mg Oral BID   aspirin EC  81 mg Oral Daily   Chlorhexidine Gluconate Cloth  6 each Topical Daily   cyanocobalamin  1,000 mcg Oral Daily   diltiazem  360 mg Oral Daily   feeding supplement  237 mL Oral BID BM   lidocaine  1 patch Transdermal Q24H   lidocaine  1 patch Transdermal Q24H   metoprolol succinate  50 mg Oral BID   multivitamin with minerals  1 tablet Oral Daily   pantoprazole  40 mg Oral Daily   rosuvastatin  40 mg Oral Daily   senna-docusate  2 tablet Oral BID   sodium bicarbonate  650 mg Oral BID    Objective: Vital signs in last 24 hours: Patient Vitals for the past 24 hrs:  BP Temp Temp src Pulse Resp SpO2 Weight  08/06/22 1204 (!) 158/68 98.3 F (36.8 C) Oral 70 18 92 % --  08/06/22 0834 (!) 169/73 98 F (36.7 C) -- 79 18 93 % --  08/06/22 0517 (!) 159/68 -- -- 72 -- -- --  08/06/22 0433 -- -- -- -- -- -- 94.4 kg  08/05/22 2316 (!) 157/71 98.3 F (36.8 C) Oral 74 16 93 % --  08/05/22 1933 (!) 157/69 99.1 F (37.3 C) Oral  82 16 95 % --  08/05/22 1614 (!) 159/70 99.2 F (37.3 C) Oral 77 14 94 % --      PHYSICAL EXAM:  General: in bed- awake , alert Folows commands Pupils equal Lungs:b/la ir entry Heart: s1s2 Abdomen: Soft, non-tender,not distended. Bowel sounds normal. No masses Extremities:mild swelling rt knee Movt of rt leg - flexion of knee, SLR limited compared to left leg Left leg- not full flexion of knee Skin: No rashes or lesions. Or bruising Lymph: Cervical, supraclavicular normal. Neurologic: non focal Rt leg appears to be weak but more because of rt knee painl  Lab Results    Latest Ref Rng & Units 08/06/2022    5:54 AM 08/04/2022    6:16 AM 08/02/2022    6:25 AM  CBC  WBC 4.0 - 10.5 K/uL 8.3  10.7  16.1   Hemoglobin 13.0 - 17.0 g/dL 9.4  16.1  09.6   Hematocrit 39.0 - 52.0 % 27.6  30.1  30.0   Platelets 150 - 400 K/uL 204  215  209        Latest Ref Rng & Units 08/06/2022    5:54 AM 08/05/2022    6:22 AM 08/04/2022  6:16 AM  CMP  Glucose 70 - 99 mg/dL 161  096  045   BUN 8 - 23 mg/dL 62  72  86   Creatinine 0.61 - 1.24 mg/dL 4.09  8.11  9.14   Sodium 135 - 145 mmol/L 136  136  132   Potassium 3.5 - 5.1 mmol/L 4.0  4.1  3.7   Chloride 98 - 111 mmol/L 108  108  104   CO2 22 - 32 mmol/L 21  22  20    Calcium 8.9 - 10.3 mg/dL 8.0  8.0  7.9       Microbiology: BC-08/01/22  MSSA 08/03/22 BC NG so far  Studies/Results: MRI lumbar spine L1-L2 concerning for discitis and Osteomyelitis Small ventral epidural abscess ? L4-L5 and L5-s1 early discitis L2-L3 possible facet joint septic arthritis left   Assessment/Plan: 87 yr male presenting with acute onset back pain and CTA showed an penetrating atherosclerotic ulcer of the aorta thought to be the cause of back pain- on presentation on 5/18 he did not have fever or wbc- on 5/19 WBC was up to 15- but no fever- no culture sent- no mri of lumbar spine done On 5/22 endovascular procedure with placement of a 2.5 cm * 7cm with 16mm * 40mm  limbs endologix aortic endoprosthesis placement thru femoral artery access. A suprarenal aortic extension cuff was also placed of 25mm*  80mm. He was given cefazolin for the procedure He had fever /rt knee pain and swelling on 5/30 and blood culture sent and is MSSA   MSSA bacteremia-  no culture was done on admission, and he had a high wbc the next day of admission, so possible that it was brewing before admission MRI lumbar spine shows L-L2 discitis /osteo and small ventral phlegmon L2-L3 left facet joint septic arthritis ? L4-L5/L5-s1 disicits  TEE no endocarditis  On cefazolin Repeat blood culture neg Will need for 6 weeks  09/13/22   Encephalopathy- ,resolved     Rt knee minimal swelling - some stiffness and pain-= flexion limited  Deconditioned Did some leg exercises in bed     Afib after admission- on metoprolol and diltiazem Rate controlled   AKI improving   Anemia? ? PAD   Discussed the management with wife at bed side and with care team OPAT orders placed

## 2022-08-06 NOTE — Consult Note (Signed)
Consultation Note Date: 08/06/2022   Patient Name: Brett Riley  DOB: 1935/03/31  MRN: [id number]  Age / Sex: 87 y.o., male  PCP: Dale [place], MD Referring Physician: Marrion Coy, MD  Reason for Consultation: Establishing goals of care  HPI/Patient Profile: Per EMR, Brett Riley is a 87 y.o. Caucasian male with medical history significant for coronary artery disease, hypertension, dyslipidemia and urolithiasis, who presented to the ER with acute onset of bilateral low back pain that started 07/20/2022 in the morning after he got out of bed.  He was barely able to stand per his wife.  He had nausea around  noon. Came to ED.   Clinical Assessment and Goals of Care: Notes and labs reviewed.  In to see patient.  He states he is feeling better.  He states his pain is currently controlled.   He advises that he is married and has 1 child.  He is fully independent at baseline, and drives.  He states he was working the day before he came to the hospital.  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  He discusses the events that led to his admission.  He has been kept updated and is able to correctly articulate his diagnoses and current status.  Initiated discussions on goals of care, and patient states that he has advanced directives in place, and trusts his wife to continue to make decisions based on the advanced directives in place.  Patient is currently waiting to see if he is CIR candidate versus SNF.  SUMMARY OF RECOMMENDATIONS    Continue current care   Prognosis:  Unable to determine      Primary Diagnoses: Present on Admission:  Atherosclerotic ulcer of aorta (HCC)  Thrombocytopenia (HCC)   I have reviewed the medical record, interviewed the patient and family, and examined the patient. The following aspects are pertinent.  Past Medical History:  Diagnosis Date   Cancer [facility])    skin cancer  basal   Carotid arterial disease (HCC)    a. 02/2016 L CEA; b. 01/2017 U/S: patent LICA, 1-39% RICA.   Celiac artery stenosis (HCC)    Coronary artery disease    a. 01/2016 MV: mild apical/basal inferior, apical lateral, mid anterolateral, and mid inferolateral ischemia. EF 57%; b. 02/2016 Cath: LM 40ost, LAD 70p/m, 20d, D1 95 (small), LCX nl, RCA 65m, RPDA 90 (small), EF 55-65%-->med Rx. Rec CABG for recurrent symptoms.   Hearing loss    History of echocardiogram    a. 02/2016 Echo: EF 50-55%, no rwma, mild MR.   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   Osteoarthritis, hip, bilateral    Left > Right   Social History   Socioeconomic History   Marital status: Married    Spouse name: Not on file   Number of children: 1   Years of education: Not on file   Highest education level: Not on file  Occupational  History    Employer: [employer]  Tobacco Use   Smoking status: Never   Smokeless tobacco: Never  Vaping Use   Vaping Use: Never used  Substance and Sexual Activity   Alcohol use: No    Alcohol/week: 0.0 standard drinks of alcohol   Drug use: No   Sexual activity: Not Currently  Other Topics Concern   Not on file  Social History Narrative   Not on file   Social Determinants of Health   Financial Resource Strain: Low Risk  (08/29/2020)   Overall Financial Resource Strain (CARDIA)    Difficulty of Paying Living Expenses: Not hard at all  Food Insecurity: No Food Insecurity (07/20/2022)   Hunger Vital Sign    Worried About Running Out of Food in the Last Year: Never true    Ran Out of Food in the Last Year: Never true  Transportation Needs: No Transportation Needs (07/20/2022)   PRAPARE - Administrator, Civil Service (Medical): No    Lack of Transportation (Non-Medical): No  Physical Activity: Insufficiently Active (08/29/2020)   Exercise Vital Sign    Days of Exercise per Week: 3  days    Minutes of Exercise per Session: 20 min  Stress: No Stress Concern Present (08/29/2020)   [employer] of Occupational Health - Occupational Stress Questionnaire    Feeling of Stress : Not at all  Social Connections: Unknown (08/29/2020)   Social Connection and Isolation Panel [NHANES]    Frequency of Communication with Friends and Family: Not on file    Frequency of Social Gatherings with Friends and Family: Not on file    Attends Religious Services: Not on file    Active Member of Clubs or Organizations: Not on file    Attends Banker Meetings: Not on file    Marital Status: Married   Family History  Problem Relation Age of Onset   Stroke Mother    Heart disease Father        MI   Heart attack Father    Breast cancer Sister    Colon cancer Neg Hx    Prostate cancer Neg Hx    Scheduled Meds:  amiodarone  200 mg Oral BID   apixaban  2.5 mg Oral BID   aspirin EC  81 mg Oral Daily   Chlorhexidine Gluconate Cloth  6 each Topical Daily   cyanocobalamin  1,000 mcg Oral Daily   diltiazem  360 mg Oral Daily   feeding supplement  237 mL Oral BID BM   lidocaine  1 patch Transdermal Q24H   lidocaine  1 patch Transdermal Q24H   metoprolol succinate  50 mg Oral BID   multivitamin with minerals  1 tablet Oral Daily   pantoprazole  40 mg Oral Daily   rosuvastatin  40 mg Oral Daily   senna-docusate  2 tablet Oral BID   sodium bicarbonate  650 mg Oral BID   Continuous Infusions:  sodium chloride 75 mL/hr at 08/06/22 0909    ceFAZolin (ANCEF) IV 2 g (08/06/22 0507)   PRN Meds:.acetaminophen **OR** acetaminophen, hydrALAZINE, magnesium hydroxide, metoprolol tartrate, naLOXone (NARCAN)  injection, ondansetron (ZOFRAN) IV, ondansetron (ZOFRAN) IV, mouth rinse, oxyCODONE, sodium chloride, traZODone Medications Prior to Admission:  Prior to Admission medications   Medication Sig Start Date End Date Taking? Authorizing Provider  amLODipine (NORVASC) 5 MG tablet Take  1 tablet (5 mg total) by mouth daily. 04/05/22  Yes Dale [place], MD  clopidogrel (PLAVIX) 75 MG tablet  TAKE 1 TABLET BY MOUTH EVERY DAY WITH BREAKFAST 02/14/22  Yes Dale [place], MD  lisinopril (ZESTRIL) 40 MG tablet Take 1 tablet (40 mg total) by mouth daily. 02/14/22  Yes Dale [place], MD  metoprolol succinate (TOPROL-XL) 100 MG 24 hr tablet TAKE 1 TABLET BY MOUTH EVERY DAY WITH A MEAL 11/21/21  Yes Dale [place], MD  Multiple Vitamins-Iron (MULTIVITAMINS WITH IRON) TABS Take 1 tablet by mouth daily.   Yes [provider]  rosuvastatin (CRESTOR) 10 MG tablet Take 1 tablet (10 mg total) by mouth daily. 02/14/22  Yes Dale [place], MD  vitamin B-12 (CYANOCOBALAMIN) 1000 MCG tablet Take 1,000 mcg by mouth daily.   Yes [provider]  aspirin 81 MG tablet Take 81 mg by mouth at bedtime.     [provider]   No Known Allergies Review of Systems  All other systems reviewed and are negative.   Physical Exam Pulmonary:     Effort: Pulmonary effort is normal.  Neurological:     Mental Status: He is alert.     Vital Signs: BP (!) 158/68 (BP Location: Left Arm)   Pulse 70   Temp 98.3 F (36.8 C) (Oral)   Resp 18   Ht 6' (1.829 m)   Wt 94.4 kg   SpO2 92%   BMI 28.23 kg/m  Pain Scale: 0-10 POSS *See Group Information*: 1-Acceptable,Awake and alert Pain Score: 2    SpO2: SpO2: 92 % O2 Device:SpO2: 92 % O2 Flow Rate: .O2 Flow Rate (L/min): 2 L/min  IO: Intake/output summary:  Intake/Output Summary (Last 24 hours) at 08/06/2022 1220 Last data filed at 08/06/2022 0900 Gross per 24 hour  Intake 1396.07 ml  Output 1650 ml  Net -253.93 ml    LBM: Last BM Date : 08/05/22 Baseline Weight: Weight: 89.4 kg Most recent weight: Weight: 94.4 kg        Signed by: Morton Stall, NP   Please contact Palliative Medicine Team phone at [phone] for questions and concerns.  For individual provider: See Loretha Stapler

## 2022-08-06 NOTE — Progress Notes (Signed)
PHARMACY CONSULT NOTE FOR:  OUTPATIENT  PARENTERAL ANTIBIOTIC THERAPY (OPAT)  Indication: Vertebral osteomyelitis  Regimen: Cefazolin 2g IV Q8H  End date: 09/13/22   IV antibiotic discharge orders are pended. To discharging provider:  please sign these orders via discharge navigator,  Select New Orders & click on the button choice - Manage This Unsigned Work.     Thank you for allowing pharmacy to be a part of this patient's care.  [name], PharmD PGY-2 Infectious Diseases Resident  08/07/2022 8:08 AM

## 2022-08-06 NOTE — TOC Progression Note (Addendum)
 Transition of Care [facility]) - Progression Note    Patient Details  Name: Brett Riley MRN: [id number] Date of Birth: 23-Oct-1935  [facility] of [facility] CM/SW Contact  Tomasa JAYSON Childes, RN Phone Number: 08/06/2022, 1:15 PM  Clinical Narrative:    Retrieve a VM from patient's daughter stating she had some concerns.  Return call back to patient's daughter, Jerel. She has concerns about patient's care and would like to speak with someone. She stated she had tried to contact patient relations unsuccessfully. She was advised her concerns would be relayed to the DON.  Jerel inquired about CIR. She was advised per CIR admissions note patient was denied for CIR due to insurance decision being based on care could be given at lower level like SNF. Jerel was provided bed offers for [facility], [facility], and [facility]. She would like to discuss the decision with the patient's wife prior to making a decision. Jerel has been advised the choice of facility is needed prior to starting auth. She was also advised [facility] are approved by the insurance and time frames to received the auth back. She denied any other questions about the authorization process. Jerel is requesting to speak with the DON regarding concerns.  ADON notified.         Expected Discharge Plan and Services                                               Social Determinants of Health (SDOH) Interventions SDOH Screenings   Food Insecurity: No Food Insecurity (07/20/2022)  Housing: Low Risk  (07/20/2022)  Transportation Needs: No Transportation Needs (07/20/2022)  Utilities: Not At Risk (07/20/2022)  Depression (PHQ2-9): Low Risk  (06/06/2022)  Financial Resource Strain: Low Risk  (08/29/2020)  Physical Activity: Insufficiently Active (08/29/2020)  Social Connections: Unknown (08/29/2020)  Stress: No Stress Concern Present (08/29/2020)  Tobacco Use: Low Risk  (08/06/2022)    Readmission Risk Interventions     No  data to display

## 2022-08-07 DIAGNOSIS — I10 Essential (primary) hypertension: Secondary | ICD-10-CM | POA: Diagnosis not present

## 2022-08-07 DIAGNOSIS — M4620 Osteomyelitis of vertebra, site unspecified: Secondary | ICD-10-CM | POA: Diagnosis not present

## 2022-08-07 DIAGNOSIS — R7881 Bacteremia: Secondary | ICD-10-CM | POA: Diagnosis not present

## 2022-08-07 DIAGNOSIS — Z7189 Other specified counseling: Secondary | ICD-10-CM | POA: Diagnosis not present

## 2022-08-07 DIAGNOSIS — I719 Aortic aneurysm of unspecified site, without rupture: Secondary | ICD-10-CM | POA: Diagnosis not present

## 2022-08-07 LAB — BASIC METABOLIC PANEL WITH GFR
Anion gap: 5 (ref 5–15)
BUN: 50 mg/dL — ABNORMAL HIGH (ref 8–23)
CO2: 23 mmol/L (ref 22–32)
Calcium: 7.8 mg/dL — ABNORMAL LOW (ref 8.9–10.3)
Chloride: 105 mmol/L (ref 98–111)
Creatinine, Ser: 1.67 mg/dL — ABNORMAL HIGH (ref 0.61–1.24)
GFR, Estimated: 40 mL/min — ABNORMAL LOW
Glucose, Bld: 97 mg/dL (ref 70–99)
Potassium: 4.3 mmol/L (ref 3.5–5.1)
Sodium: 133 mmol/L — ABNORMAL LOW (ref 135–145)

## 2022-08-07 LAB — C-REACTIVE PROTEIN: CRP: 10.6 mg/dL — ABNORMAL HIGH

## 2022-08-07 LAB — SEDIMENTATION RATE: Sed Rate: 106 mm/h — ABNORMAL HIGH (ref 0–20)

## 2022-08-07 MED ORDER — TRAMADOL HCL 50 MG PO TABS
50.0000 mg | ORAL_TABLET | Freq: Three times a day (TID) | ORAL | Status: DC | PRN
  Administered 2022-08-08: 100 mg via ORAL
  Administered 2022-08-08: 50 mg via ORAL
  Filled 2022-08-07: qty 1
  Filled 2022-08-07: qty 2

## 2022-08-07 MED ORDER — TRAMADOL HCL 50 MG PO TABS: 50.0000 mg | ORAL_TABLET | Freq: Four times a day (QID) | ORAL | Status: DC | PRN

## 2022-08-07 MED ORDER — TRAMADOL HCL 50 MG PO TABS
100.0000 mg | ORAL_TABLET | Freq: Three times a day (TID) | ORAL | Status: DC | PRN
  Administered 2022-08-07: 100 mg via ORAL
  Filled 2022-08-07: qty 2

## 2022-08-07 NOTE — Progress Notes (Addendum)
Daily Progress Note   Patient Name: Brett Riley       Date: 08/07/2022 DOB: 20-Jun-1935  Age: 87 y.o. MRN#: [id number] Attending Physician: Delfino Lovett, MD Primary Care Physician: Dale [place], MD Admit Date: 07/20/2022  Reason for Consultation/Follow-up: Establishing goals of care  Subjective: Notes and labs reviewed.  In to see patient.  Patient's wife is currently at bedside.  She discusses in depth his hospitalization up to this point and concerns with previous pain management and oversedation.  Wife voices frustration that patient is not a CIR candidate as they are being told that he cannot tolerate CIR level of therapy.  Explored this further with them.  She discusses that currently patient is unable to sit on the side of the bed, or participate more with therapy due to pain.  Patient confirms that his activity is limited by back pain.  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    We discussed risk versus benefits of various pain management.  MAR reviewed.  We discussed possible options for symptom management, and risks including lethargy, sleepiness, and sedation. I discussed that I would happy to make recommendations to primary service.  Wife requests for me to speak to their daughter via telephone.  She called her daughter on her speaker phone.  Discussed the previous conversation that was had with wife and patient.  Daughter discusses that she has requested an ethics consult.  She states she would like patient to be able to participate with therapy, as her goal is to get him back to his previous quality of life.   Epic chat with primary service, PT/OT, ethics, primary RN, ID regarding his symptom management.  As per epic chat with  primary team, PMT will assist with symptom management.  ADDENDUM: revisited patient. He worked with therapy and as per patient and family, he did well today. He states his pain was controlled with therapy. Ultram dosing modified to 50mg  for moderate pain and 100mg  for severe pain- and prior to therapy, TID PRN.  Length of Stay: 18  Current Medications: Scheduled Meds:   amiodarone  200 mg Oral BID   apixaban  2.5 mg Oral BID   aspirin EC  81 mg Oral Daily   Chlorhexidine Gluconate Cloth  6 each Topical  Daily   cyanocobalamin  1,000 mcg Oral Daily   diltiazem  360 mg Oral Daily   feeding supplement  237 mL Oral BID BM   lidocaine  1 patch Transdermal Q24H   lidocaine  1 patch Transdermal Q24H   metoprolol succinate  50 mg Oral BID   multivitamin with minerals  1 tablet Oral Daily   pantoprazole  40 mg Oral Daily   rosuvastatin  40 mg Oral Daily   senna-docusate  2 tablet Oral BID   sodium bicarbonate  650 mg Oral BID   sodium chloride flush  10-40 mL Intracatheter Q12H    Continuous Infusions:   ceFAZolin (ANCEF) IV 2 g (08/07/22 0512)    PRN Meds: acetaminophen **OR** acetaminophen, hydrALAZINE, magnesium hydroxide, metoprolol tartrate, naLOXone (NARCAN)  injection, ondansetron (ZOFRAN) IV, ondansetron (ZOFRAN) IV, mouth rinse, sodium chloride, sodium chloride flush, traMADol, traZODone  Physical Exam Pulmonary:     Effort: Pulmonary effort is normal.  Neurological:     Mental Status: He is alert.             Vital Signs: BP (!) 150/62 (BP Location: Right Arm)   Pulse 74   Temp 98.3 F (36.8 C)   Resp 18   Ht 6' (1.829 m)   Wt 94.8 kg   SpO2 93%   BMI 28.34 kg/m  SpO2: SpO2: 93 % O2 Device: O2 Device: Room Air O2 Flow Rate: O2 Flow Rate (L/min): 2 L/min  Intake/output summary:  Intake/Output Summary (Last 24 hours) at 08/07/2022 1305 Last data filed at 08/07/2022 1233 Gross per 24 hour  Intake 632.81 ml  Output 2575 ml  Net -1942.19 ml   LBM: Last BM Date :  08/05/22 Baseline Weight: Weight: 89.4 kg Most recent weight: Weight: 94.8 kg   Patient Active Problem List   Diagnosis Date Noted   MSSA bacteremia 08/05/2022   Vertebral osteomyelitis (HCC) 08/05/2022   Acute osteomyelitis of lumbar spine (HCC) 08/03/2022   Metabolic acidosis 08/03/2022   Discitis of lumbar region 08/03/2022   Epidural abscess 08/03/2022   MSSA (methicillin susceptible Staphylococcus aureus) septicemia (HCC) 08/02/2022   AKI (acute kidney injury) (HCC) 08/01/2022   Effusion of right knee 08/01/2022   Acute metabolic encephalopathy 08/01/2022   Hyponatremia 07/31/2022   Overweight (BMI 25.0-29.9) 07/31/2022   Persistent atrial fibrillation (HCC) 07/30/2022   Atrial fibrillation with rapid ventricular response (HCC) 07/23/2022   PVD (peripheral vascular disease) (HCC) 07/21/2022   Penetrating atherosclerotic ulcer of aorta (HCC) 07/20/2022   Uncontrolled hypertension 07/20/2022   Dyslipidemia 07/20/2022   Coronary artery disease 07/20/2022   Hypoxemia 07/20/2022   Scalp lesion 10/14/2021   Abnormal CT of the abdomen 04/09/2021   Open wound of skin 10/16/2020   Atherosclerotic ulcer of aorta (HCC) 08/27/2020   Leg pain 01/10/2020   Fever 10/03/2019   Hypertension 06/01/2019   Aneurysm artery, 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   Dizziness 08/04/2016   Abdominal bruit 08/04/2016   Carotid stenosis, asymptomatic, left 02/15/2016   CAD (coronary artery disease)    Preop cardiovascular exam    Health care maintenance 09/11/2014   Hip region mass 12/12/2013   Soft tissue mass 11/28/2013   Fatigue 07/28/2013   Environmental allergies 07/28/2013   Carotid artery disease (HCC) 01/21/2013   Thrombocytopenia (HCC) 07/26/2012   Anemia 07/26/2012   Essential hypertension, benign 05/24/2012   Hyperlipidemia 05/24/2012    Palliative Care Assessment & Plan     Recommendations/Plan: Patient and family are unhappy that patient does not  qualify for CIR.  They would  like to increase pain management as patient is able to tolerate without sedation, to enable him to work with therapy. Discussed initiation of Ultram 100 mg 3 times daily as needed for pain, and continuation of as needed Tylenol. ADDENDUM: Ultram order later modified to 50-100mg  TID PRN as patient states pain was well controlled with 100mg  while working with PT/OT this afternoon.  Discussed option of trying hydrocodone 2.5 to 5 mg as needed if the Ultram is not effective. TENS unit ordered. PMT will continue to follow.  Code Status:    Code Status Orders  (From admission, onward)           Start     Ordered   08/05/22 1614  Do not attempt resuscitation (DNR)  Continuous       Question Answer Comment  If patient has no pulse and is not breathing Do Not Attempt Resuscitation   If patient has a pulse and/or is breathing: Medical Treatment Goals LIMITED ADDITIONAL INTERVENTIONS: Use medication/IV fluids and cardiac monitoring as indicated; Do not use intubation or mechanical ventilation (DNI), also provide comfort medications.  Transfer to Progressive/Stepdown as indicated, avoid Intensive Care.   Consent: Discussion documented in EHR or advanced directives reviewed      08/05/22 1615           Code Status History     Date Active Date Inactive Code Status Order ID Comments User Context   07/20/2022 2007 08/05/2022 1615 Full Code [id number]  Hannah Beat, MD ED   02/15/2016 1807 02/16/2016 1851 Full Code [id number]  Annice Needy, MD Inpatient   01/19/2016 0941 01/19/2016 1552 Full Code [id number]  Iran Ouch, MD Inpatient      Advance Directive Documentation    Flowsheet Row Most Recent Value  Type of Advance Directive Healthcare Power of Attorney, Living will  Pre-existing out of facility DNR order (yellow form or pink MOST form) --  "MOST" Form in Place? --       Prognosis:  Unable to determine    Thank you for allowing the Palliative Medicine Team to assist in the  care of this patient.  Morton Stall, NP  Please contact Palliative Medicine Team phone at [phone] for questions and concerns.

## 2022-08-07 NOTE — Progress Notes (Addendum)
Patient last seen/examined by me on 08/01/22 to rule out septic knee. At that time, he was having an acute onset R knee pain and swelling with fever due to unknown etiology. His knee exam was relatively benign. 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 Recommendation was for 6 weeks of IV abx for this.   There was recent concern for increased R knee pain/swelling.   Per patient's wife, who was at bedside, he does have increased pain with certain motions, but this is intermittent and quickly resolves. He does not have a constant pain. He is able to flex/extend his R knee without too much difficulty. She feels that it is still somewhat swollen.   PE: BP (!) 150/62 (BP Location: Right Arm)   Pulse 74   Temp 98.3 F (36.8 C)   Resp 18   Ht 6' (1.829 m)   Wt 94.8 kg   SpO2 93%   BMI 28.34 kg/m   Gen: NAD, able to to respond to commands  RLE: +DF/PF/EHL SILT grossly intact over foot Foot wwp RoM Knee: able to passively range knee from 0-90 degrees with minimal pain. Can get to ~120 degrees knee flexion with more significant pain No significant erythema or warmth about the knee. 1+ joint effusion is present   LLE: 5/5 DF/PF/EHL SILT s/s/t/sp/dp distr Foot wwp RoM Knee: actively 0-100, passively to 120. No erythema or warmth. No effusion.  Assessment/Plan: 87 year old male with right knee pain in the setting of MSSA bacteremia/lumbar epidural abscess/discitis/osteotomyelitis. 1.  We discussed the possible diagnoses as well as various treatment options.  Given his current exam, there is very low suspicion of septic joint or inflammatory process such as gout/pseudogout.  This likely appears to be related to underlying degenerative changes to the knee.  We will plan to defer joint aspiration at this time   2.  Treatment options for this would include medical management with Tylenol/NSAIDs and continued physical therapy  as tolerated.  We did discuss that a corticosteroid injection could be of benefit in the future, but given current infection, we agreed to defer corticosteroid injection.   3.  Will plan to follow patient peripherally.  Please page with any further questions or significant change in the exam.

## 2022-08-07 NOTE — Progress Notes (Signed)
Physical Therapy Treatment Patient Details Name: Brett Riley MRN: [id number] DOB: 01/15/1936 Today's Date: 08/07/2022   History of Present Illness Brett Riley is an 86yoM who comes to [facility] on 07/20/22 c acute onset severe, immobilizing back pain. 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 No aneurysm or dissection.  ER with acute onset of bilateral low back pain that started 07/20/2022 in the morning after he got out of bed. Patient had aortic ulcer repair on 5/22. Patient becomes septic with a high fever, tachycardia and altered mental status on 5/30. Antibiotic started. MRI of the lumbar spine shows significant osteomyelitis with abscess.  Neurosurgery consult obtained, not recommended surgery. Rt knee effusion also being followed.    PT Comments    Pain meds changed today, author awaits implementation prior to entry. Pt appears drowsy on multiple attempts, remains fairly drowsy, delayed/minimal response throughout session. Pt given frequent tactile/verbal cues for attending to exercises and quality movement as cued. After 15 minutes of bed-level exercise of legs, pain improved to 3.5/10. Rt SAQ is weakest appearing movement, also pain limited but tolerated with active assistance. Pt willing to attempt EOB, can do about half without assist and with HOB elevated, but ultimately requires modA to get to EOB. Pt has not sat EOB in a couple days, hence it takes several minutes to tolerate a neutral short sitting position without posterior lean. Eventually pt able to do a little seated marching, albeit taxing, then able to STS 3x~20sec from very elevated EOB, modA. Pt remains pain controlled with standing attempts, but legs become rubbery with fatigue fairly quickly. Pt fatigued by end of session, assisted by to supine, then elevated HOB to ~40 degrees as desired, knees bent, heels floating. Will continue to follow.     Recommendations for follow up therapy are one component of a multi-disciplinary discharge planning process, led by the attending physician.  Recommendations may be updated based on patient status, additional functional criteria and insurance authorization.  Follow Up Recommendations  Can patient physically be transported by private vehicle: No    Assistance Recommended at Discharge Frequent or constant Supervision/Assistance  Patient can return home with the following Two people to help with walking and/or transfers;Two people to help with bathing/dressing/bathroom;Assistance with cooking/housework;Assist for transportation;Help with stairs or ramp for entrance   Equipment Recommendations  None recommended by PT    Recommendations for Other Services       Precautions / Restrictions Precautions Precautions: Fall Restrictions Weight Bearing Restrictions: No     Mobility  Bed Mobility Overal bed mobility: Needs Assistance Bed Mobility: Supine to Sit, Sit to Supine     Supine to sit: HOB elevated, Mod assist Sit to supine: Max assist, +2 for physical assistance   General bed mobility comments: wife monitors picc line while author assists pt back into supine    Transfers Overall transfer level: Needs assistance Equipment used: None (hug transfer) Transfers: Sit to/from Stand Sit to Stand: From elevated surface, Mod assist           General transfer comment: 3x~20 seconds, knees grow rubbery each time    Ambulation/Gait Ambulation/Gait assistance:  (not ready to progress to overground mobility yet)                 Stairs             Wheelchair Mobility    Modified Rankin (Stroke Patients Only)  Balance                                            Cognition Arousal/Alertness:  (drowsy) Behavior During Therapy: Flat affect (delayed and minimal response at times) Overall Cognitive Status: Within Functional Limits for tasks  assessed                                 General Comments: [facility]        Exercises General Exercises - Lower Extremity Ankle Circles/Pumps: AROM, Both, 5 reps, Supine Short Arc Quad: AROM, 15 reps, AAROM, Seated, Limitations, Both Short Arc Quad Limitations: A/ROM Lt, AA/ROM Left; needs repeat verbal cues to attend to task due to drowsiness. Heel Slides: AAROM, AROM, 15 reps, Limitations, Seated, Both Heel Slides Limitations: A/ROM Lt, AA/ROM Left; needs repeat verbal cues to attend to task due to drowsiness. Hip ABduction/ADduction: AROM, Seated, AAROM, Both, 15 reps Hip Flexion/Marching: AROM, Both, 10 reps, Seated, Limitations Hip Flexion/Marching Limitations: requires EOB elevation due to long tibiae and DJD hip flexion ROM limitations. Mini-Sqauts: Strengthening, Both, 10 reps, Seated, Limitations Mini Squats Limitations: manually resisted hip/knee extension Other Exercises Other Exercises: EOB sitting, progression from 2 hand support to zero hand support x15 minutes    General Comments        Pertinent Vitals/Pain Pain Assessment Pain Assessment: 0-10 Pain Score: 4  Pain Location: "across whole back", down to 3.5/10 afterbed level ROM exercises Pain Intervention(s): Limited activity within patient's tolerance, Monitored during session, Premedicated before session    Home Living                          Prior Function            PT Goals (current goals can now be found in the care plan section) Acute Rehab PT Goals Patient Stated Goal: get stronger PT Goal Formulation: With patient/family Time For Goal Achievement: 08/09/22 Potential to Achieve Goals: Fair Progress towards PT goals: Progressing toward goals    Frequency    Min 3X/week      PT Plan Current plan remains appropriate;Discharge plan needs to be updated;Frequency needs to be updated    Co-evaluation              AM-PAC PT "6 Clicks" Mobility   Outcome Measure   Help needed turning from your back to your side while in a flat bed without using bedrails?: Total Help needed moving from lying on your back to sitting on the side of a flat bed without using bedrails?: Total Help needed moving to and from a bed to a chair (including a wheelchair)?: Total Help needed standing up from a chair using your arms (e.g., wheelchair or bedside chair)?: Total Help needed to walk in hospital room?: Total Help needed climbing 3-5 steps with a railing? : Total 6 Click Score: 6    End of Session   Activity Tolerance: Patient tolerated treatment well;No increased pain;Patient limited by fatigue Patient left: with call bell/phone within reach;in bed;with bed alarm set Nurse Communication: Mobility status PT Visit Diagnosis: Muscle weakness (generalized) (M62.81);Difficulty in walking, not elsewhere classified (R26.2)     Time: 1610-9604 PT Time Calculation (min) (ACUTE ONLY): 47 min  Charges:  $Therapeutic Exercise: 23-37 mins $Therapeutic Activity: 8-22 mins  2:26 PM, 08/07/22 Rosamaria Lints, PT, DPT Physical Therapist - [facility]  [phone] (ASCOM)             Alashia Brownfield C 08/07/2022, 2:20 PM

## 2022-08-07 NOTE — TOC Progression Note (Addendum)
Transition of Care [facility]) - Progression Note    Patient Details  Name: Brett Riley MRN: [id number] Date of Birth: 12-Feb-1936  [facility] CM/SW Contact  Truddie Hidden, RN Phone Number: 08/07/2022, 9:48 AM  Clinical Narrative:    Spoke with patient's, daughter, Aurther Loft to confirm bed offer. The family has chosen [facility]. She has been advised of the time frame to recived an authorization back.Terri verbalized her understanding. She reported the ADON had contacted her and was grateful for follow up regarding her concerns.   10:30am Drucie Opitz has been started and is pending  per [name], [facility] assistant.  MD notified.         Expected Discharge Plan and Services                                               Social Determinants of Health (SDOH) Interventions SDOH Screenings   Food Insecurity: No Food Insecurity (07/20/2022)  Housing: Low Risk  (07/20/2022)  Transportation Needs: No Transportation Needs (07/20/2022)  Utilities: Not At Risk (07/20/2022)  Depression (PHQ2-9): Low Risk  (06/06/2022)  Financial Resource Strain: Low Risk  (08/29/2020)  Physical Activity: Insufficiently Active (08/29/2020)  Social Connections: Unknown (08/29/2020)  Stress: No Stress Concern Present (08/29/2020)  Tobacco Use: Low Risk  (08/06/2022)    Readmission Risk Interventions     No data to display

## 2022-08-07 NOTE — Progress Notes (Signed)
Inpatient Rehab Admissions Coordinator:    I spoke with Pt.'s daughter regarding CIR denial. I explained the insurance process and gave her the option to resubmit case or appeal case with pt.'s payor, but advised her that a second denial is likely. I also let her know that even if insurance approves and Pt. Came to rehab, it would only be for around 2 weeks and family may need to take patient home requiring significant physical assistance. She felt that she would prefer a quick discharge to SNF, rather than patient waiting several more days for a second insurance denial for CIR. She wants Pt. To d/c to [facility]. I let TOC know and will sign off at this time.   Megan Salon, MS, CCC-SLP Rehab Admissions Coordinator  [phone] (celll) [phone] (office)

## 2022-08-07 NOTE — TOC Progression Note (Signed)
Transition of Care [facility]) - Progression Note    Patient Details  Name: Brett Riley MRN: [id number] Date of Birth: 1935-07-28  Transition of Care [facility]) CM/SW Contact  Truddie Hidden, RN Phone Number: 08/07/2022, 11:38 AM  Clinical Narrative:    Patient will require IV Cefazolin through 7/12.  Spoke with Tiffany in admissions at facility. Referral for bed offer is being revisited due to change.  Tiffany will updated this RNCN when more information is available.          Expected Discharge Plan and Services                                               Social Determinants of Health (SDOH) Interventions SDOH Screenings   Food Insecurity: No Food Insecurity (07/20/2022)  Housing: Low Risk  (07/20/2022)  Transportation Needs: No Transportation Needs (07/20/2022)  Utilities: Not At Risk (07/20/2022)  Depression (PHQ2-9): Low Risk  (06/06/2022)  Financial Resource Strain: Low Risk  (08/29/2020)  Physical Activity: Insufficiently Active (08/29/2020)  Social Connections: Unknown (08/29/2020)  Stress: No Stress Concern Present (08/29/2020)  Tobacco Use: Low Risk  (08/06/2022)    Readmission Risk Interventions     No data to display

## 2022-08-07 NOTE — Progress Notes (Signed)
Occupational Therapy Treatment Patient Details Name: Brett Riley MRN: [id number] DOB: 1935/11/26 Today's Date: 08/07/2022   History of present illness Brett Riley is an 86yoM who comes to [facility] on 07/20/22 c acute onset severe, immobilizing back pain. 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 No aneurysm or dissection.  ER with acute onset of bilateral low back pain that started 07/20/2022 in the morning after he got out of bed. Patient had aortic ulcer repair on 5/22. Patient becomes septic with a high fever, tachycardia and altered mental status on 5/30. Antibiotic started. MRI of the lumbar spine shows significant osteomyelitis with abscess.  Neurosurgery consult obtained, not recommended surgery. Rt knee effusion also being followed.   OT comments  Pt states he is tired after an active PT session but is still agreeable to working with OT. He endorses no pain in supine, although reports pain increases to 4/10 with EOB sitting. Pt requires Mod A for bed mobility, particularly for repositioning R LE. Pt has posterior lean in sitting, with arms and trunk shaking continually while he is sitting, pt clearly working hard to maintain upright seated posture. Pt declines to attempt standing this date. Will continue to follow PoC.    Recommendations for follow up therapy are one component of a multi-disciplinary discharge planning process, led by the attending physician.  Recommendations may be updated based on patient status, additional functional criteria and insurance authorization.    Assistance Recommended at Discharge Frequent or constant Supervision/Assistance  Patient can return home with the following  A lot of help with bathing/dressing/bathroom;Assistance with cooking/housework;Assist for transportation;Help with stairs or ramp for entrance;A lot of help with walking and/or transfers   Equipment  Recommendations       Recommendations for Other Services      Precautions / Restrictions Precautions Precautions: Fall Restrictions Weight Bearing Restrictions: No       Mobility Bed Mobility Overal bed mobility: Needs Assistance Bed Mobility: Rolling, Supine to Sit, Sit to Supine Rolling: Mod assist   Supine to sit: HOB elevated, Mod assist Sit to supine: Mod assist   General bed mobility comments: Max A + 2 for scooting towards HOB in supine    Transfers                   General transfer comment: pt unable     Balance Overall balance assessment: Needs assistance Sitting-balance support: Feet supported, Bilateral upper extremity supported Sitting balance-Leahy Scale: Fair Sitting balance - Comments: tires after ~ 2 minutes Postural control: Posterior lean                                 ADL either performed or assessed with clinical judgement   ADL Overall ADL's : Needs assistance/impaired                                            Extremity/Trunk Assessment Upper Extremity Assessment Upper Extremity Assessment: Generalized weakness   Lower Extremity Assessment Lower Extremity Assessment: Generalized weakness        Vision       Perception     Praxis      Cognition Arousal/Alertness: Awake/alert Behavior During Therapy: Flat affect, WFL for tasks assessed/performed Overall Cognitive  Status: Within Functional Limits for tasks assessed                                 General Comments: [facility]        Exercises      Shoulder Instructions       General Comments      Pertinent Vitals/ Pain       Pain Assessment Pain Assessment: 0-10 Pain Score: 4  Pain Location: across small of back Pain Descriptors / Indicators: Guarding, Discomfort, Grimacing, Aching Pain Intervention(s): Limited activity within patient's tolerance, Repositioned, Monitored during session  Home Living                                           Prior Functioning/Environment              Frequency  Min 1X/week        Progress Toward Goals  OT Goals(current goals can now be found in the care plan section)  Progress towards OT goals: Progressing toward goals  Acute Rehab OT Goals OT Goal Formulation: With patient/family Time For Goal Achievement: 08/09/22 Potential to Achieve Goals: Good  Plan Discharge plan remains appropriate;Frequency remains appropriate    Co-evaluation                 AM-PAC OT "6 Clicks" Daily Activity     Outcome Measure   Help from another person eating meals?: A Little Help from another person taking care of personal grooming?: A Little Help from another person toileting, which includes using toliet, bedpan, or urinal?: A Lot Help from another person bathing (including washing, rinsing, drying)?: A Lot Help from another person to put on and taking off regular upper body clothing?: A Little Help from another person to put on and taking off regular lower body clothing?: A Lot 6 Click Score: 15    End of Session    OT Visit Diagnosis: Unsteadiness on feet (R26.81);Muscle weakness (generalized) (M62.81)   Activity Tolerance Patient limited by lethargy   Patient Left in bed;with call bell/phone within reach;with bed alarm set;with family/visitor present   Nurse Communication          Time: 1610-9604 OT Time Calculation (min): 23 min  Charges: OT General Charges $OT Visit: 1 Visit OT Treatments $Self Care/Home Management : 23-37 mins   Latina Craver, PhD, MS, OTR/L 08/07/22, 5:03 PM

## 2022-08-07 NOTE — Ethics Note (Signed)
Spoke with pt's wife today after receiving secure chats from the pt's care team.  Family requested ethics consult.  Family is concerned about pt's state of health compared to what it was prior to hospitalization and poor communication from  health care team members. Family stated nursing care has been really good and they had no complaints.  There was no true ethical situation.  TOC has been working with family for placement at d/c. Recommended family talk to someone from patient experience.  I sent an email to [place] explaining situation and asking her to contact family as soon as possible.

## 2022-08-07 NOTE — Progress Notes (Signed)
   08/07/22 1400  Spiritual Encounters  Type of Visit Initial  Care provided to: Pt and family  Conversation partners present during encounter Nurse  Referral source Nurse (RN/NT/LPN)  Reason for visit Routine spiritual support  OnCall Visit No  Spiritual Framework  Presenting Themes Meaning/purpose/sources of inspiration;Goals in life/care;Caregiving needs;Impactful experiences and emotions;Courage hope and growth  Values/beliefs Christian  Community/Connection Family;Friend(s);Faith community;Spiritual leader  Patient Stress Factors Health changes  Family Stress Factors None identified  Interventions  Spiritual Care Interventions Made Established relationship of care and support;Compassionate presence;Reflective listening;Encouragement;Self-care teaching  Intervention Outcomes  Outcomes Awareness around self/spiritual resourses  Spiritual Care Plan  Spiritual Care Issues Still Outstanding Chaplain will continue to follow   Chaplain received a spiritual care consult for patient. Chaplain spoke with patients wife about the pain that he has been in and their frustration with not receiving care in a timely manner. NP came in and shared with the patients wife about follow up treatments and hope for rehabilitation. Chaplain acknowledged that the patients wife has felt a lot of stress, as his primary caregiver. Patients wife has spent the night at the hospital almost the entire time he has been here. His wife also shared with me that their pastor has been here to visit them several times. I told her that I was happy to hear that and let her know that we are also available if she needs [place]. Informed her that we are here to offer any support that we can for the patient and for her family. Chaplain also reminded his wife to make sure she is getting rest and eating properly, so that she will remain healthy during this stressful time.

## 2022-08-07 NOTE — Progress Notes (Signed)
Progress Note   Patient: Brett Riley AVW:[id number] DOB: 06-Jul-1935 DOA: 07/20/2022     18 DOS: the patient was seen and examined on 08/07/2022   Brief hospital course:  Brett Riley is a 87 y.o. Caucasian male with medical history significant for coronary artery disease, hypertension, dyslipidemia and urolithiasis, who presented to the ER with acute onset of bilateral low back pain that started 07/20/2022 in the morning after he got out of bed.  He was barely able to stand per his wife.  He had nausea around  noon. Came to ED.  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 No aneurysm or dissection. 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  Patient had aortic ulcer repair on 5/22.  Postoperatively, patient was receiving physical therapy.  Patient becomes septic with a high fever, tachycardia and altered mental status on 5/30.  Antibiotic started with cefepime and vancomycin.  Blood culture came back with MSSA on 5/31.  Antibiotics switched to cefazolin. MRI of the lumbar spine shows significant osteomyelitis with abscess.  Neurosurgery consult obtained, not recommended surgery. TEE was performed on 6/3, no evidence of endocarditis.  6/5: Ultram added, Ortho reevaluation for right knee, neurosurgery reevaluation for discitis/osteo.  Tens unit  Assessment and Plan:  MSSA septicemia. Lumbar discitis/osteo and small ventral phlegmon Metabolic encephalopathy. Generalized weakness. Patient spiked a fever on 5/30, met sepsis criteria with high fever, tachycardia and tachypnea.  Blood culture grew MSSA.  Antibiotics switched to cefazolin. Patient has been followed by ID.  MRI of the lumbar spine showed significant osteomyelitis/discitis with abscess. Discussed with Dr. Myer Haff, not considering the surgical intervention.  He will reevaluate him TEE showed no  evidence of endocarditis. Continue cefazolin IV for 6 weeks till 09/13/2022 through PICC line. Repeated blood culture came back negative after 3 days. Patient will need long-term antibiotics.  Discussed with Dr. Wynelle Link, patient is unlikely to be a candidate for AV fistula.  Okay to place PICC line with current renal function. Tramadol added today after discussion with palliative care for better pain control and hope to participate with therapy more   Acute kidney injury. Hyponatremia likely secondary to SIADH Metabolic acidosis. Renal ultrasound did not show any hydronephrosis, showed medical renal abnormality. Patient initially had urinary tension, required a 1 I&O cath.  No additional urinary retention in subsequent bladder scans. Renal function continued to improve, sodium level also improved.  Discontinue fluids.  Follow renal function. Continue sodium bicarb orally for mild metabolic acidosis.   Right knee effusion with osteoarthritis. He was seen by orthopedics, no indication of infection.  Reevaluated by Ortho today.  They are not planning any joint aspiration.  Dr. Allena Katz feels this is underlying DJD to the knee    Penetrating atherosclerotic ulcer of aorta (HCC) with penetration with bilateral leg and back pain S/P Endologix aortic endoprosthesis 5/22.  Currently on aspirin and Eliquis, high dose statin.      Atrial fibrillation new onset 05/20 22:58 Heart rate is adequately controlled.  Continue anticoagulation with Eliquis.   Essential hypertension  Dyslipidemia Continue beta-blocker and statin.   Coronary artery disease Continue current treatment.         Subjective: Right knee pain 4/10 and limited mobility.  Wife at bedside sharing concerns about limited therapy while here in the hospital and him being too sleepy at times with pain medicines earlier in  the course.  She seems upset about him not able to go CIR but understands and receptive to SNF placement  Physical  Exam: Vitals:   08/07/22 0514 08/07/22 0754 08/07/22 1231 08/07/22 1623  BP: (!) 153/76 (!) 167/73 (!) 150/62 (!) 150/73  Pulse: 76 77 74 68  Resp: 16 18 18 18   Temp: 98 F (36.7 C) 98.3 F (36.8 C) 98.3 F (36.8 C)   TempSrc: Oral     SpO2: 92% 92% 93% 94%  Weight: 94.8 kg     Height:       General exam: Appears calm and comfortable  Respiratory system: Clear to auscultation. Respiratory effort normal. Cardiovascular system: S1 & S2 heard, RRR. No JVD, murmurs, rubs, gallops or clicks. No pedal edema. Gastrointestinal system: Abdomen is nondistended, soft and nontender. No organomegaly or masses felt. Normal bowel sounds heard. Central nervous system: Alert and oriented x3. No focal neurological deficits. Extremities: Right knee joint effusion present.  No erythema or warmth.  He does have joint mobility up to 0 to 90 degrees with minimal pain Skin: No rashes, lesions or ulcers Psychiatry: Mood & affect appropriate.  Data Reviewed:  Creatinine 1.67  Family Communication: Updated wife at bedside and daughter over phone  Disposition: Status is: Inpatient Remains inpatient appropriate because: Management of bacteremia, deconditioning  Planned Discharge Destination: Skilled nursing facility   DVT prophylaxis-Eliquis Time spent: 35 minutes  Author: Delfino Lovett, MD 08/07/2022 4:30 PM  For on call review [email or web address].

## 2022-08-08 DIAGNOSIS — M4646 Discitis, unspecified, lumbar region: Secondary | ICD-10-CM | POA: Diagnosis not present

## 2022-08-08 DIAGNOSIS — Z7189 Other specified counseling: Secondary | ICD-10-CM | POA: Diagnosis not present

## 2022-08-08 DIAGNOSIS — B9561 Methicillin susceptible Staphylococcus aureus infection as the cause of diseases classified elsewhere: Secondary | ICD-10-CM | POA: Diagnosis not present

## 2022-08-08 DIAGNOSIS — M4626 Osteomyelitis of vertebra, lumbar region: Secondary | ICD-10-CM | POA: Diagnosis not present

## 2022-08-08 DIAGNOSIS — R7881 Bacteremia: Secondary | ICD-10-CM | POA: Diagnosis not present

## 2022-08-08 DIAGNOSIS — I10 Essential (primary) hypertension: Secondary | ICD-10-CM | POA: Diagnosis not present

## 2022-08-08 DIAGNOSIS — G062 Extradural and subdural abscess, unspecified: Secondary | ICD-10-CM

## 2022-08-08 DIAGNOSIS — I719 Aortic aneurysm of unspecified site, without rupture: Secondary | ICD-10-CM | POA: Diagnosis not present

## 2022-08-08 DIAGNOSIS — G061 Intraspinal abscess and granuloma: Secondary | ICD-10-CM | POA: Diagnosis not present

## 2022-08-08 LAB — BASIC METABOLIC PANEL WITH GFR
Anion gap: 5 (ref 5–15)
BUN: 47 mg/dL — ABNORMAL HIGH (ref 8–23)
CO2: 23 mmol/L (ref 22–32)
Calcium: 7.9 mg/dL — ABNORMAL LOW (ref 8.9–10.3)
Chloride: 107 mmol/L (ref 98–111)
Creatinine, Ser: 1.58 mg/dL — ABNORMAL HIGH (ref 0.61–1.24)
GFR, Estimated: 43 mL/min — ABNORMAL LOW
Glucose, Bld: 101 mg/dL — ABNORMAL HIGH (ref 70–99)
Potassium: 4.2 mmol/L (ref 3.5–5.1)
Sodium: 135 mmol/L (ref 135–145)

## 2022-08-08 LAB — CULTURE, BLOOD (ROUTINE X 2)
Culture: NO GROWTH
Culture: NO GROWTH
Special Requests: ADEQUATE
Special Requests: ADEQUATE

## 2022-08-08 LAB — CBC
HCT: 28 % — ABNORMAL LOW (ref 39.0–52.0)
Hemoglobin: 9.2 g/dL — ABNORMAL LOW (ref 13.0–17.0)
MCH: 31.4 pg (ref 26.0–34.0)
MCHC: 32.9 g/dL (ref 30.0–36.0)
MCV: 95.6 fL (ref 80.0–100.0)
Platelets: 228 10*3/uL (ref 150–400)
RBC: 2.93 MIL/uL — ABNORMAL LOW (ref 4.22–5.81)
RDW: 14.1 % (ref 11.5–15.5)
WBC: 7.6 10*3/uL (ref 4.0–10.5)
nRBC: 0 % (ref 0.0–0.2)

## 2022-08-08 MED ORDER — POLYETHYLENE GLYCOL 3350 17 G PO PACK
17.0000 g | PACK | Freq: Every day | ORAL | Status: DC
  Administered 2022-08-08 – 2022-08-09 (×2): 17 g via ORAL
  Filled 2022-08-08 (×2): qty 1

## 2022-08-08 NOTE — Progress Notes (Signed)
Progress Note   Patient: Brett Riley:[id number] DOB: February 04, 1936 DOA: 07/20/2022     19 DOS: the patient was seen and examined on 08/08/2022   Brief hospital course:  Brett Riley is a 87 y.o. Caucasian male with medical history significant for coronary artery disease, hypertension, dyslipidemia and urolithiasis, who presented to the ER with acute onset of bilateral low back pain that started 07/20/2022 in the morning after he got out of bed.  He was barely able to stand per his wife.  He had nausea around  noon. Came to ED.  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 No aneurysm or dissection. 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  Patient had aortic ulcer repair on 5/22.  Postoperatively, patient was receiving physical therapy.  Patient becomes septic with a high fever, tachycardia and altered mental status on 5/30.  Antibiotic started with cefepime and vancomycin.  Blood culture came back with MSSA on 5/31.  Antibiotics switched to cefazolin. MRI of the lumbar spine shows significant osteomyelitis with abscess.  Neurosurgery consult obtained, not recommended surgery. TEE was performed on 6/3, no evidence of endocarditis.  6/5: Ultram added, Ortho reevaluation for right knee, neurosurgery reevaluation for discitis/osteo.  Tens unit 6/6: TOC working on placement. Likely DC tomorrow  Assessment and Plan:  MSSA septicemia. Lumbar discitis/osteo and small ventral phlegmon Metabolic encephalopathy. Generalized weakness. Patient spiked a fever on 5/30, met sepsis criteria with high fever, tachycardia and tachypnea.  Blood culture grew MSSA.  Antibiotics switched to cefazolin. Patient has been followed by ID.  MRI of the lumbar spine showed significant osteomyelitis/discitis with abscess. Discussed with Dr. Myer Haff, not considering the surgical  intervention.  He will reevaluate him TEE showed no evidence of endocarditis. Continue cefazolin IV for 6 weeks till 09/13/2022 through PICC line. Repeated blood culture came back negative after 3 days. Patient will need long-term antibiotics.  Discussed with Dr. Wynelle Link, patient is unlikely to be a candidate for AV fistula.  Okay to place PICC line with current renal function. Continue Tramadol prn   Acute kidney injury. Hyponatremia likely secondary to SIADH Metabolic acidosis. Renal ultrasound did not show any hydronephrosis, showed medical renal abnormality. Patient initially had urinary tension, required a 1 I&O cath.  No additional urinary retention in subsequent bladder scans. Renal function at baseline   Right knee effusion with osteoarthritis. He was seen by orthopedics, no indication of infection.  Reevaluated by Ortho today.  They are not planning any joint aspiration.  Dr. Allena Katz feels this is underlying DJD to the knee    Penetrating atherosclerotic ulcer of aorta (HCC) with penetration with bilateral leg and back pain S/P Endologix aortic endoprosthesis 5/22.  Currently on aspirin and Eliquis, high dose statin.      Atrial fibrillation new onset 05/20 22:58 Heart rate is adequately controlled.  Continue anticoagulation with Eliquis.   Essential hypertension  Dyslipidemia Continue beta-blocker and statin.   Coronary artery disease Continue current treatment.     Subjective: no new issues, willing to work with therapy. Wife at bedside hopeful for SNF  Physical Exam: Vitals:   08/08/22 0310 08/08/22 0824 08/08/22 1246 08/08/22 1535  BP: (!) 154/96 (!) 164/85 (!) 147/64 (!) 152/69  Pulse: 77 78 72 90  Resp: 18 18 20 16   Temp: 97.7 F (36.5 C) 98 F (36.7 C) 98.4 F (36.9 C) 98.1  F (36.7 C)  TempSrc: Oral     SpO2: 100% 98% 93% 96%  Weight:      Height:       General exam: Appears calm and comfortable  Respiratory system: Clear to auscultation. Respiratory  effort normal. Cardiovascular system: S1 & S2 heard, RRR. No JVD, murmurs, rubs, gallops or clicks. No pedal edema. Gastrointestinal system: Abdomen is nondistended, soft and nontender. No organomegaly or masses felt. Normal bowel sounds heard. Central nervous system: Alert and oriented x3. No focal neurological deficits. Extremities: Right knee joint effusion present.  No erythema or warmth.  He does have joint mobility up to 0 to 90 degrees with minimal pain Skin: No rashes, lesions or ulcers Psychiatry: Mood & affect appropriate.  Data Reviewed:  There are no new results to review at this time.  Family Communication: wife updated at bedside earlier today. Daughter updated over phone this evening and wife also for insurance auth and likely DC to SNF tomorrow  Disposition: Status is: Inpatient Remains inpatient appropriate because: Management of bacteremia, deconditioning   Planned Discharge Destination: Skilled nursing facility   DVT prophylaxis-Eliquis  Time spent: 35 minutes  Author: Delfino Lovett, MD 08/08/2022 6:10 PM  For on call review [email or web address].

## 2022-08-08 NOTE — Plan of Care (Signed)

## 2022-08-08 NOTE — Progress Notes (Signed)
Date of Admission:  07/20/2022     ID: Brett Riley is a 87 y.o. male Principal Problem:   Penetrating atherosclerotic ulcer of aorta (HCC) Active Problems:   Thrombocytopenia (HCC)   Preop cardiovascular exam   Atherosclerotic ulcer of aorta (HCC)   Uncontrolled hypertension   Dyslipidemia   Coronary artery disease   Hypoxemia   PVD (peripheral vascular disease) (HCC)   Atrial fibrillation with rapid ventricular response (HCC)   Persistent atrial fibrillation (HCC)   Hyponatremia   Overweight (BMI 25.0-29.9)   AKI (acute kidney injury) (HCC)   Effusion of right knee   Acute metabolic encephalopathy   MSSA (methicillin susceptible Staphylococcus aureus) septicemia (HCC)   Acute osteomyelitis of lumbar spine (HCC)   Metabolic acidosis   Discitis of lumbar region   Epidural abscess   MSSA bacteremia   Vertebral osteomyelitis (HCC)    Subjective: Says he was tired to participate in PT today  Medications:   amiodarone  200 mg Oral BID   apixaban  2.5 mg Oral BID   aspirin EC  81 mg Oral Daily   Chlorhexidine Gluconate Cloth  6 each Topical Daily   cyanocobalamin  1,000 mcg Oral Daily   diltiazem  360 mg Oral Daily   feeding supplement  237 mL Oral BID BM   lidocaine  1 patch Transdermal Q24H   lidocaine  1 patch Transdermal Q24H   metoprolol succinate  50 mg Oral BID   multivitamin with minerals  1 tablet Oral Daily   pantoprazole  40 mg Oral Daily   polyethylene glycol  17 g Oral Daily   rosuvastatin  40 mg Oral Daily   senna-docusate  2 tablet Oral BID   sodium chloride flush  10-40 mL Intracatheter Q12H    Objective: Vital signs in last 24 hours: Patient Vitals for the past 24 hrs:  BP Temp Temp src Pulse Resp SpO2 Weight  08/08/22 1535 (!) 152/69 98.1 F (36.7 C) -- 90 16 96 % --  08/08/22 1246 (!) 147/64 98.4 F (36.9 C) -- 72 20 93 % --  08/08/22 0824 (!) 164/85 98 F (36.7 C) -- 78 18 98 % --  08/08/22 0310 (!) 154/96 97.7 F (36.5 C) Oral 77 18  100 % --  08/08/22 0310 (!) 154/96 -- -- 78 -- 97 % --  08/08/22 0304 -- -- -- -- -- -- 95.2 kg  08/08/22 0025 (!) 158/75 97.8 F (36.6 C) Oral 72 18 93 % --  08/07/22 1947 (!) 151/71 98.1 F (36.7 C) Oral 76 18 94 % --      PHYSICAL EXAM:  General: in bed- awake , alert lungs:b/la ir entry Heart: s1s2 Abdomen: Soft, non-tender,not distended. Bowel sounds normal. No masses Extremities:mild swelling rt knee Movt of lower extremities - more movt  Skin: No rashes or lesions. Or bruising Lymph: Cervical, supraclavicular normal. Neurologic: non focal Rt leg appears to be weak but more because of rt knee pain  Lab Results    Latest Ref Rng & Units 08/08/2022    4:30 AM 08/06/2022    5:54 AM 08/04/2022    6:16 AM  CBC  WBC 4.0 - 10.5 K/uL 7.6  8.3  10.7   Hemoglobin 13.0 - 17.0 g/dL 9.2  9.4  09.8   Hematocrit 39.0 - 52.0 % 28.0  27.6  30.1   Platelets 150 - 400 K/uL 228  204  215        Latest Ref Rng &  Units 08/08/2022    4:30 AM 08/07/2022    5:15 AM 08/06/2022    5:54 AM  CMP  Glucose 70 - 99 mg/dL 409  97  811   BUN 8 - 23 mg/dL 47  50  62   Creatinine 0.61 - 1.24 mg/dL 9.14  7.82  9.56   Sodium 135 - 145 mmol/L 135  133  136   Potassium 3.5 - 5.1 mmol/L 4.2  4.3  4.0   Chloride 98 - 111 mmol/L 107  105  108   CO2 22 - 32 mmol/L 23  23  21    Calcium 8.9 - 10.3 mg/dL 7.9  7.8  8.0       Microbiology: BC-08/01/22  MSSA 08/03/22 BC NG so far  Studies/Results: MRI lumbar spine L1-L2 concerning for discitis and Osteomyelitis Small ventral epidural abscess ? L4-L5 and L5-s1 early discitis L2-L3 possible facet joint septic arthritis left   Assessment/Plan: 87 yr male presenting with acute onset back pain and CTA showed an penetrating atherosclerotic ulcer of the aorta thought to be the cause of back pain- on presentation on 5/18 he did not have fever or wbc- on 5/19 WBC was up to 15- but no fever- no culture sent- no mri of lumbar spine done On 5/22 endovascular procedure  with placement of a 2.5 cm * 7cm with 16mm * 40mm limbs endologix aortic endoprosthesis placement thru femoral artery access. A suprarenal aortic extension cuff was also placed of 25mm*  80mm. He was given cefazolin for the procedure He had fever /rt knee pain and swelling on 5/30 and blood culture sent and is MSSA   MSSA bacteremia-  no culture was done on admission, and he had a high wbc the next day of admission, so possible that it was brewing before admission MRI lumbar spine shows L-L2 discitis /osteo and small ventral phlegmon L2-L3 left facet joint septic arthritis ? L4-L5/L5-s1 disicits Reassessed by neurosurgery- medical management  TEE no endocarditis  On cefazolin Repeat blood culture neg Will need for 6 weeks  09/13/22   Encephalopathy- ,resolved     Rt knee minimal swelling - some stiffness and pain-= flexion limited Reassessed by Dr.Patel- no intervention    Afib after admission- on metoprolol and diltiazem Rate controlled   AKI improving   Anemia? ? PAD   Waiting to go to SNF Discussed the management with wife at bed side and with care team OPAT orders placed Will follow him as OP ID will sign off- call if needed

## 2022-08-08 NOTE — Care Management Important Message (Signed)
Important Message  Patient Details  Name: Brett Riley MRN: [id number] Date of Birth: 10-Dec-1935   Medicare Important Message Given:  Yes     [name] 08/08/2022, 1:42 PM

## 2022-08-08 NOTE — Progress Notes (Signed)
PT Cancellation Note  Patient Details Name: Brett Riley MRN: [id number] DOB: [birth year]   Cancelled Treatment:    Reason Eval/Treat Not Completed: Other (comment) (several attempts to see pt today, not available many of these time. Pt was available around 10:30a but declined due to sleepness night ang general fatigue, trying to sleep.) Will continue to follow and attempt treatment again at later date/time.   12:40 PM, 08/08/22 Brett Riley, PT, DPT Physical Therapist - [facility]  [phone] (ASCOM)    Tressia Labrum C 08/08/2022, 12:40 PM

## 2022-08-08 NOTE — TOC Progression Note (Addendum)
Transition of Care [facility]) - Progression Note    Patient Details  Name: NOLYN GETTMAN MRN: [id number] Date of Birth: 25-Oct-1935  [facility] CM/SW Contact  Truddie Hidden, RN Phone Number: 08/08/2022, 1:03 PM  Clinical Narrative:    Spoke with Tiffany in admissions at [facility]. Bed offer rescinded due to facility not able to accommodate patient longer than two week duration.  Spoke with patient's wife at the bedside to advised bed offer had been rescinded.  She inquired about if patient would be accepted at [facility] [address]  Patient's wife was advised he was denied for IP rehab d/t insurance stating his care could be provided at a lower level. Patient wife given other bed offers for [facility] and [facility].  Patient wife chose [facility]. She was advised [name] is still pending.   Spoke with Gena at [facility]. She reviewing referral to see if patient bed offer is still valid since he will require IV antibiotics through 7/12. Gena will update this RNCM when a decision has been made  Drucie Opitz still pending MD notified.  1:28pm Spoke with Gena at [facility]. Patient bed offer is valid Message sent to [facility] assistant to change auth request from [employer] to [employer]. Gena advised [name] was previously initiated. Due to contractual agreement with third party entity and [facility], the facility is requesting the previous auth initiated by [facility] to be cancel. Per Gena the facility will start the auth today.         Expected Discharge Plan and Services                                               Social Determinants of Health (SDOH) Interventions SDOH Screenings   Food Insecurity: No Food Insecurity (07/20/2022)  Housing: Low Risk  (07/20/2022)  Transportation Needs: No Transportation Needs (07/20/2022)  Utilities: Not At Risk (07/20/2022)  Depression (PHQ2-9): Low Risk  (06/06/2022)  Financial Resource Strain: Low Risk  (08/29/2020)  Physical Activity: Insufficiently Active  (08/29/2020)  Social Connections: Unknown (08/29/2020)  Stress: No Stress Concern Present (08/29/2020)  Tobacco Use: Low Risk  (08/06/2022)    Readmission Risk Interventions     No data to display

## 2022-08-08 NOTE — Progress Notes (Addendum)
Daily Progress Note   Patient Name: Brett Riley       Date: 08/08/2022 DOB: 06-08-1935  Age: 87 y.o. MRN#: [id number] Attending Physician: Delfino Lovett, MD Primary Care Physician: Dale Jemison, MD Admit Date: 07/20/2022  Reason for Consultation/Follow-up: Establishing goals of care  Subjective: Notes and labs reviewed.  MAR reviewed.  In to see patient.  Patient's wife is at bedside.  Patient states his pain is better controlled, but still present particularly with mobility.  Discussed sleepiness.  Discussed Ultram dose of 50 to 100 mg 3 times daily as needed; patient and wife would like to continue this.  Discussed renal function, and discussed NSAIDs.  Wife does not want any NSAIDs used on her husband at this time.  They discussed hopes and expectations of rehab.  Patient is awaiting a TENS unit to be delivered in hopes that this will help with his back pain.  Length of Stay: 19  Current Medications: Scheduled Meds:   amiodarone  200 mg Oral BID   apixaban  2.5 mg Oral BID   aspirin EC  81 mg Oral Daily   Chlorhexidine Gluconate Cloth  6 each Topical Daily   cyanocobalamin  1,000 mcg Oral Daily   diltiazem  360 mg Oral Daily   feeding supplement  237 mL Oral BID BM   lidocaine  1 patch Transdermal Q24H   lidocaine  1 patch Transdermal Q24H   metoprolol succinate  50 mg Oral BID   multivitamin with minerals  1 tablet Oral Daily   pantoprazole  40 mg Oral Daily   rosuvastatin  40 mg Oral Daily   senna-docusate  2 tablet Oral BID   sodium bicarbonate  650 mg Oral BID   sodium chloride flush  10-40 mL Intracatheter Q12H    Continuous Infusions:   ceFAZolin (ANCEF) IV 2 g (08/08/22 1503)    PRN Meds: acetaminophen **OR** acetaminophen, hydrALAZINE, magnesium hydroxide,  metoprolol tartrate, naLOXone (NARCAN)  injection, ondansetron (ZOFRAN) IV, ondansetron (ZOFRAN) IV, mouth rinse, sodium chloride, sodium chloride flush, traMADol, traZODone  Physical Exam Pulmonary:     Effort: Pulmonary effort is normal.  Neurological:     Mental Status: He is alert.             Vital Signs: BP (!) 147/64 (BP Location: Left Arm)  Pulse 72   Temp 98.4 F (36.9 C)   Resp 20   Ht 6' (1.829 m)   Wt 95.2 kg   SpO2 93%   BMI 28.46 kg/m  SpO2: SpO2: 93 % O2 Device: O2 Device: Room Air O2 Flow Rate: O2 Flow Rate (L/min): 2 L/min  Intake/output summary:  Intake/Output Summary (Last 24 hours) at 08/08/2022 1511 Last data filed at 08/08/2022 1459 Gross per 24 hour  Intake 240 ml  Output 2450 ml  Net -2210 ml   LBM: Last BM Date : 08/06/22 Baseline Weight: Weight: 89.4 kg Most recent weight: Weight: 95.2 kg         Patient Active Problem List   Diagnosis Date Noted   MSSA bacteremia 08/05/2022   Vertebral osteomyelitis (HCC) 08/05/2022   Acute osteomyelitis of lumbar spine (HCC) 08/03/2022   Metabolic acidosis 08/03/2022   Discitis of lumbar region 08/03/2022   Epidural abscess 08/03/2022   MSSA (methicillin susceptible Staphylococcus aureus) septicemia (HCC) 08/02/2022   AKI (acute kidney injury) (HCC) 08/01/2022   Effusion of right knee 08/01/2022   Acute metabolic encephalopathy 08/01/2022   Hyponatremia 07/31/2022   Overweight (BMI 25.0-29.9) 07/31/2022   Persistent atrial fibrillation (HCC) 07/30/2022   Atrial fibrillation with rapid ventricular response (HCC) 07/23/2022   PVD (peripheral vascular disease) (HCC) 07/21/2022   Penetrating atherosclerotic ulcer of aorta (HCC) 07/20/2022   Uncontrolled hypertension 07/20/2022   Dyslipidemia 07/20/2022   Coronary artery disease 07/20/2022   Hypoxemia 07/20/2022   Scalp lesion 10/14/2021   Abnormal CT of the abdomen 04/09/2021   Open wound of skin 10/16/2020   Atherosclerotic ulcer of aorta (HCC)  08/27/2020   Leg pain 01/10/2020   Fever 10/03/2019   Hypertension 06/01/2019   Aneurysm artery, 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   Dizziness 08/04/2016   Abdominal bruit 08/04/2016   Carotid stenosis, asymptomatic, left 02/15/2016   CAD (coronary artery disease)    Preop cardiovascular exam    Health care maintenance 09/11/2014   Hip region mass 12/12/2013   Soft tissue mass 11/28/2013   Fatigue 07/28/2013   Environmental allergies 07/28/2013   Carotid artery disease (HCC) 01/21/2013   Thrombocytopenia (HCC) 07/26/2012   Anemia 07/26/2012   Essential hypertension, benign 05/24/2012   Hyperlipidemia 05/24/2012    Palliative Care Assessment & Plan    Recommendations/Plan: PMT will follow   Code Status:    Code Status Orders  (From admission, onward)           Start     Ordered   08/05/22 1614  Do not attempt resuscitation (DNR)  Continuous       Question Answer Comment  If patient has no pulse and is not breathing Do Not Attempt Resuscitation   If patient has a pulse and/or is breathing: Medical Treatment Goals LIMITED ADDITIONAL INTERVENTIONS: Use medication/IV fluids and cardiac monitoring as indicated; Do not use intubation or mechanical ventilation (DNI), also provide comfort medications.  Transfer to Progressive/Stepdown as indicated, avoid Intensive Care.   Consent: Discussion documented in EHR or advanced directives reviewed      08/05/22 1615           Code Status History     Date Active Date Inactive Code Status Order ID Comments User Context   07/20/2022 2007 08/05/2022 1615 Full Code [id number]  Hannah Beat, MD ED   02/15/2016 1807 02/16/2016 1851 Full Code [id number]  Annice Needy, MD Inpatient   01/19/2016 0941 01/19/2016 1552 Full Code [id number]  Iran Ouch,  MD Inpatient      Advance Directive Documentation    Flowsheet Row Most Recent Value  Type of Advance Directive Healthcare Power of Attorney, Living will  Pre-existing out of facility  DNR order (yellow form or pink MOST form) --  "MOST" Form in Place? --       Care plan was discussed in epic chat with various members of the care team including PT/OT regarding TENS unit   Thank you for allowing the Palliative Medicine Team to assist in the care of this patient.    Morton Stall, NP  Please contact Palliative Medicine Team phone at [phone] for questions and concerns.

## 2022-08-08 NOTE — Progress Notes (Signed)
   Neurosurgery Progress Note  History: Brett Riley is here for discitis and aortic aneurysm.  08/03/2022 [name] is here today with a chief complaint of back pain and difficulty walking.  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  He developed sepsis on May 30.  Due to continued back pain and right leg pain, MRI was performed showing likely infection.  I was consulted for recommendations.   He has had back pain for a couple of weeks, as well as some difficulty walking due to RLE pain.  08/08/2022 Continues to have back pain.  Worked with PTOT and was able to stand.  Physical Exam: Vitals:   08/08/22 1246 08/08/22 1535  BP: (!) 147/64 (!) 152/69  Pulse: 72 90  Resp: 20 16  Temp: 98.4 F (36.9 C) 98.1 F (36.7 C)  SpO2: 93% 96%    AA CNI  Strength:MAE but weaker in RLE  Data:  Other tests/results: reviewed  Assessment/Plan:  Brett Riley is suffering from epidural abscess at L1/2 and possible discitis at L4/5 and L5/S1.  He has pain but no cauda equina syndrome or acute deficit.   I do not think surgery would be helpful at this time.  I would recommend aggressive PTOT. Defer further management to primary and other consultants.   Venetia Night MD, [facility] [address] Department of Neurosurgery

## 2022-08-09 DIAGNOSIS — M6259 Muscle wasting and atrophy, not elsewhere classified, multiple sites: Secondary | ICD-10-CM | POA: Diagnosis not present

## 2022-08-09 DIAGNOSIS — R195 Other fecal abnormalities: Secondary | ICD-10-CM | POA: Diagnosis not present

## 2022-08-09 DIAGNOSIS — K219 Gastro-esophageal reflux disease without esophagitis: Secondary | ICD-10-CM | POA: Diagnosis not present

## 2022-08-09 DIAGNOSIS — R7881 Bacteremia: Secondary | ICD-10-CM | POA: Diagnosis not present

## 2022-08-09 DIAGNOSIS — I2510 Atherosclerotic heart disease of native coronary artery without angina pectoris: Secondary | ICD-10-CM | POA: Diagnosis not present

## 2022-08-09 DIAGNOSIS — I700 Atherosclerosis of aorta: Secondary | ICD-10-CM | POA: Diagnosis not present

## 2022-08-09 DIAGNOSIS — G062 Extradural and subdural abscess, unspecified: Secondary | ICD-10-CM | POA: Diagnosis not present

## 2022-08-09 DIAGNOSIS — Z7409 Other reduced mobility: Secondary | ICD-10-CM | POA: Diagnosis not present

## 2022-08-09 DIAGNOSIS — R51 Headache with orthostatic component, not elsewhere classified: Secondary | ICD-10-CM | POA: Diagnosis not present

## 2022-08-09 DIAGNOSIS — K562 Volvulus: Secondary | ICD-10-CM | POA: Diagnosis not present

## 2022-08-09 DIAGNOSIS — A4101 Sepsis due to Methicillin susceptible Staphylococcus aureus: Secondary | ICD-10-CM | POA: Diagnosis not present

## 2022-08-09 DIAGNOSIS — K5909 Other constipation: Secondary | ICD-10-CM | POA: Diagnosis not present

## 2022-08-09 DIAGNOSIS — I779 Disorder of arteries and arterioles, unspecified: Secondary | ICD-10-CM | POA: Diagnosis not present

## 2022-08-09 DIAGNOSIS — M4620 Osteomyelitis of vertebra, site unspecified: Secondary | ICD-10-CM | POA: Diagnosis not present

## 2022-08-09 DIAGNOSIS — B9561 Methicillin susceptible Staphylococcus aureus infection as the cause of diseases classified elsewhere: Secondary | ICD-10-CM | POA: Diagnosis not present

## 2022-08-09 DIAGNOSIS — Z743 Need for continuous supervision: Secondary | ICD-10-CM | POA: Diagnosis not present

## 2022-08-09 DIAGNOSIS — N3090 Cystitis, unspecified without hematuria: Secondary | ICD-10-CM | POA: Diagnosis not present

## 2022-08-09 DIAGNOSIS — M549 Dorsalgia, unspecified: Secondary | ICD-10-CM | POA: Diagnosis not present

## 2022-08-09 DIAGNOSIS — R945 Abnormal results of liver function studies: Secondary | ICD-10-CM | POA: Diagnosis not present

## 2022-08-09 DIAGNOSIS — N1831 Chronic kidney disease, stage 3a: Secondary | ICD-10-CM | POA: Diagnosis not present

## 2022-08-09 DIAGNOSIS — R2681 Unsteadiness on feet: Secondary | ICD-10-CM | POA: Diagnosis not present

## 2022-08-09 DIAGNOSIS — T07XXXA Unspecified multiple injuries, initial encounter: Secondary | ICD-10-CM | POA: Diagnosis not present

## 2022-08-09 DIAGNOSIS — M4626 Osteomyelitis of vertebra, lumbar region: Secondary | ICD-10-CM | POA: Diagnosis not present

## 2022-08-09 DIAGNOSIS — I719 Aortic aneurysm of unspecified site, without rupture: Secondary | ICD-10-CM | POA: Diagnosis not present

## 2022-08-09 DIAGNOSIS — I10 Essential (primary) hypertension: Secondary | ICD-10-CM | POA: Diagnosis not present

## 2022-08-09 DIAGNOSIS — I25118 Atherosclerotic heart disease of native coronary artery with other forms of angina pectoris: Secondary | ICD-10-CM | POA: Diagnosis not present

## 2022-08-09 DIAGNOSIS — Z7401 Bed confinement status: Secondary | ICD-10-CM | POA: Diagnosis not present

## 2022-08-09 DIAGNOSIS — I4819 Other persistent atrial fibrillation: Secondary | ICD-10-CM | POA: Diagnosis not present

## 2022-08-09 LAB — RENAL FUNCTION PANEL
Albumin: 1.9 g/dL — ABNORMAL LOW (ref 3.5–5.0)
Anion gap: 8 (ref 5–15)
BUN: 41 mg/dL — ABNORMAL HIGH (ref 8–23)
CO2: 22 mmol/L (ref 22–32)
Calcium: 8.1 mg/dL — ABNORMAL LOW (ref 8.9–10.3)
Chloride: 105 mmol/L (ref 98–111)
Creatinine, Ser: 1.47 mg/dL — ABNORMAL HIGH (ref 0.61–1.24)
GFR, Estimated: 47 mL/min — ABNORMAL LOW
Glucose, Bld: 119 mg/dL — ABNORMAL HIGH (ref 70–99)
Phosphorus: 3.1 mg/dL (ref 2.5–4.6)
Potassium: 4 mmol/L (ref 3.5–5.1)
Sodium: 135 mmol/L (ref 135–145)

## 2022-08-09 MED ORDER — APIXABAN 2.5 MG PO TABS: 2.5000 mg | ORAL_TABLET | Freq: Two times a day (BID) | ORAL | Status: DC

## 2022-08-09 MED ORDER — PANTOPRAZOLE SODIUM 40 MG PO TBEC: 40.0000 mg | DELAYED_RELEASE_TABLET | Freq: Every day | ORAL | Status: DC

## 2022-08-09 MED ORDER — ENSURE ENLIVE PO LIQD: 237.0000 mL | Freq: Two times a day (BID) | ORAL | 12 refills | Status: DC

## 2022-08-09 MED ORDER — CEFAZOLIN IV (FOR PTA / DISCHARGE USE ONLY): 2.0000 g | Freq: Three times a day (TID) | INTRAVENOUS | 0 refills | Status: DC

## 2022-08-09 MED ORDER — DILTIAZEM HCL ER COATED BEADS 360 MG PO CP24: 360.0000 mg | ORAL_CAPSULE | Freq: Every day | ORAL | Status: DC

## 2022-08-09 MED ORDER — AMIODARONE HCL 200 MG PO TABS: 200.0000 mg | ORAL_TABLET | Freq: Two times a day (BID) | ORAL | Status: DC

## 2022-08-09 MED ORDER — SALINE SPRAY 0.65 % NA SOLN: 1.0000 | NASAL | 0 refills | Status: DC | PRN

## 2022-08-09 MED ORDER — TRAMADOL HCL 50 MG PO TABS: 50.0000 mg | ORAL_TABLET | Freq: Two times a day (BID) | ORAL | 0 refills | Status: AC | PRN

## 2022-08-09 NOTE — Discharge Summary (Addendum)
Physician Discharge Summary   Patient: Brett Riley MRN: [id number] DOB: 28-Jan-1936  Admit date:     07/20/2022  Discharge date: 08/09/22  Discharge Physician: Delfino Lovett   PCP: Dale Tulare, MD   Recommendations at discharge:   F/up with outpt providers as requested Continue Cefazolin 2 grams IV every 8 hours for 6 weeks - End Date: 09/13/22   [facility] Care Per Protocol:   Labs weekly on Monday while on IV antibiotics: _X_ CBC with differential   _X_ CMP __X CRP X__ ESR     _X_ Please pull PIC at completion of IV antibiotics     Fax weekly lab results  promptly to [phone] Clinic Follow Up Appt: Dr,.Ravishankar 08/27/22 at 10.45AM  Discharge Diagnoses: Principal Problem:   Penetrating atherosclerotic ulcer of aorta (HCC) Active Problems:   Uncontrolled hypertension   Coronary artery disease   Hypoxemia   Thrombocytopenia (HCC)   Preop cardiovascular exam   Atherosclerotic ulcer of aorta (HCC)   Dyslipidemia   PVD (peripheral vascular disease) (HCC)   Atrial fibrillation with rapid ventricular response (HCC)   Persistent atrial fibrillation (HCC)   Hyponatremia   Overweight (BMI 25.0-29.9)   AKI (acute kidney injury) (HCC)   Effusion of right knee   Acute metabolic encephalopathy   MSSA (methicillin susceptible Staphylococcus aureus) septicemia (HCC)   Acute osteomyelitis of lumbar spine (HCC)   Metabolic acidosis   Discitis of lumbar region   Epidural abscess   MSSA bacteremia   Vertebral osteomyelitis [facility])  Hospital Course:  Brett Riley is a 87 y.o. Caucasian male with medical history significant for coronary artery disease, hypertension, dyslipidemia and urolithiasis, who presented to the ER with acute onset of bilateral low back pain that started 07/20/2022 in the morning after he got out of bed.  He was barely able to stand per his wife.  He had nausea around  noon. Came to ED.  05/18: CTA Abd/Pelv w/wo (+)Penetrating atherosclerotic ulcer of  the infrarenal abdominal aorta with a 10 mm neck and a 7 mm depth. No aneurysm or dissection. 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  Patient had aortic ulcer repair on 5/22.  Postoperatively, patient was receiving physical therapy.  Patient becomes septic with a high fever, tachycardia and altered mental status on 5/30.  Antibiotic started with cefepime and vancomycin.  Blood culture came back with MSSA on 5/31.  Antibiotics switched to cefazolin. MRI of the lumbar spine shows significant osteomyelitis with abscess.  Neurosurgery consult obtained, not recommended surgery. TEE was performed on 6/3, no evidence of endocarditis.  6/5: Ultram added, Ortho reevaluation for right knee, neurosurgery reevaluation for discitis/osteo.  Tens unit 6/6: TOC working on placement. Likely DC tomorrow  Assessment and Plan:  MSSA septicemia. Lumbar discitis/osteo and small ventral phlegmon Metabolic encephalopathy. Generalized weakness. Patient spiked a fever on 5/30, met sepsis criteria with high fever, tachycardia and tachypnea.  Blood culture grew MSSA.  Antibiotics switched to cefazolin. Patient has been followed by ID.  MRI of the lumbar spine showed significant osteomyelitis/discitis with abscess. Evaluated by Neurosurgery Dr. Myer Haff, not considering the surgical intervention. Outpt f/up TEE showed no evidence of endocarditis. Continue cefazolin IV for 6 weeks till 09/13/2022 through PICC line. Repeated blood culture came back negative after 3 days. Continue Tramadol prn   Acute kidney injury. Hyponatremia likely secondary to SIADH Metabolic acidosis. Renal ultrasound did not show any hydronephrosis, showed medical renal abnormality. Patient initially had urinary  tension, required I&O cath.  No additional urinary retention in subsequent bladder scans. Renal function at baseline   Right knee effusion with osteoarthritis. He was seen by  orthopedics, no indication of infection.no intervention/procedure need.  Dr. Allena Katz feels this is underlying DJD to the knee    Penetrating atherosclerotic ulcer of aorta (HCC) with penetration with bilateral leg and back pain S/P Endologix aortic endoprosthesis 5/22.  Currently on aspirin and Eliquis, high dose statin.      Atrial fibrillation new onset 05/20 22:58 Heart rate is adequately controlled.  Continue anticoagulation with Eliquis. Amiodarone, metoprolol and cardizem for rate control   Essential hypertension  Dyslipidemia Continue beta-blocker and statin.   Coronary artery disease Continue current treatment.       Consultants: Neurosurgery, Ortho, ID, Palliative care Procedures performed: Aortic ulcer repair on 07/24/22  Disposition: Skilled nursing facility with Palliative Care to follow Diet recommendation:  Discharge Diet Orders (From admission, onward)     Start     Ordered   08/09/22 0000  Diet - low sodium heart healthy        08/09/22 0753           Carb modified diet DISCHARGE MEDICATION: Allergies as of 08/09/2022   No Known Allergies      Medication List     STOP taking these medications    amLODipine 5 MG tablet Commonly known as: NORVASC   clopidogrel 75 MG tablet Commonly known as: PLAVIX       TAKE these medications    amiodarone 200 MG tablet Commonly known as: PACERONE Take 1 tablet (200 mg total) by mouth 2 (two) times daily.   apixaban 2.5 MG Tabs tablet Commonly known as: ELIQUIS Take 1 tablet (2.5 mg total) by mouth 2 (two) times daily.   aspirin 81 MG tablet Take 81 mg by mouth at bedtime.   ceFAZolin  IVPB Commonly known as: ANCEF Inject 2 g into the vein every 8 (eight) hours. Indication:  Vertebral osteomyelitis  First Dose: Yes Last Day of Therapy:  09/13/22  Labs - Once weekly (Monday):  CBC/D and CMP, Labs - Once weekly (Monday): ESR and CRP Fax weekly lab results  promptly to [phone] Method of  administration: IV Push- Please pull PICC at completion of IV antibiotics  Method of administration may be changed at the discretion of home infusion pharmacist based upon assessment of the patient and/or caregiver's ability to self-administer the medication ordered.   cyanocobalamin 1000 MCG tablet Commonly known as: VITAMIN B12 Take 1,000 mcg by mouth daily.   diltiazem 360 MG 24 hr capsule Commonly known as: CARDIZEM CD Take 1 capsule (360 mg total) by mouth daily.   feeding supplement Liqd Take 237 mLs by mouth 2 (two) times daily between meals.   lisinopril 40 MG tablet Commonly known as: ZESTRIL Take 1 tablet (40 mg total) by mouth daily.   metoprolol succinate 100 MG 24 hr tablet Commonly known as: TOPROL-XL TAKE 1 TABLET BY MOUTH EVERY DAY WITH A MEAL   multivitamins with iron Tabs tablet Take 1 tablet by mouth daily.   pantoprazole 40 MG tablet Commonly known as: PROTONIX Take 1 tablet (40 mg total) by mouth daily.   rosuvastatin 10 MG tablet Commonly known as: CRESTOR Take 1 tablet (10 mg total) by mouth daily.   sodium chloride 0.65 % Soln nasal spray Commonly known as: OCEAN Place 1 spray into both nostrils as needed for congestion.   traMADol 50 MG tablet Commonly  known as: ULTRAM Take 1-2 tablets (50-100 mg total) by mouth every 12 (twelve) hours as needed for up to 3 days for moderate pain or severe pain (50mg  for moderate pain, 100mg  for severe pain and before therapy).               Discharge Care Instructions  (From admission, onward)           Start     Ordered   08/09/22 0000  Change dressing on IV access line weekly and PRN  (Home infusion instructions - [facility] )        08/09/22 0752            Discharge Exam: Filed Weights   08/06/22 0433 08/07/22 0514 08/08/22 0304  Weight: 94.4 kg 94.8 kg 95.2 kg   General exam: Appears calm and comfortable  Respiratory system: Clear to auscultation. Respiratory effort  normal. Cardiovascular system: S1 & S2 heard, RRR. No JVD, murmurs, rubs, gallops or clicks. No pedal edema. Gastrointestinal system: Abdomen is nondistended, soft and nontender. No organomegaly or masses felt. Normal bowel sounds heard. Central nervous system: Alert and oriented x3. No focal neurological deficits. Extremities: Right knee joint effusion present.  No erythema or warmth. Rt knee mobility restricted due to pain at times  Skin: No rashes, lesions or ulcers Psychiatry: Mood & affect appropriate.   Condition at discharge: fair  The results of significant diagnostics from this hospitalization (including imaging, microbiology, ancillary and laboratory) are listed below for reference.   Imaging Studies: [place] EKG SITE RITE  Result Date: 08/06/2022 If Site Rite image not attached, placement could not be confirmed due to current cardiac rhythm.  ECHO TEE  Result Date: 08/06/2022    TRANSESOPHOGEAL ECHO REPORT   Patient Name:   HANEY SIMONET Date of Exam: 08/05/2022 Medical Rec #:  [id number]      Height:       72.0 in Accession #:    [id number]     Weight:       204.1 lb Date of Birth:  [birth year]     BSA:          2.149 m Patient Age:    86 years       BP:           150/65 mmHg Patient Gender: M              HR:           76 bpm. Exam Location:  [facility] Procedure: Transesophageal Echo, Color Doppler and Cardiac Doppler Indications:     Bacteremia R78.81  History:         Patient has prior history of Echocardiogram examinations, most                  recent 07/22/2022. CAD; Risk Factors:Hypertension.  Sonographer:     Cristela Blue Referring Phys:  [id number] CADENCE H FURTH Diagnosing Phys: Debbe Odea MD PROCEDURE: The transesophogeal probe was passed without difficulty through the esophogus of the patient. Sedation performed by performing physician. The patient developed no complications during the procedure.  IMPRESSIONS  1. Left ventricular ejection fraction, by estimation, is 55 to 60%. The left  ventricle has normal function.  2. Right ventricular systolic function is normal. The right ventricular size is normal.  3. No left atrial/left atrial appendage thrombus was detected.  4. The mitral valve is normal in structure. Mild mitral valve regurgitation.  5. The aortic valve is tricuspid. Aortic  valve regurgitation is trivial. Conclusion(s)/Recommendation(s): No evidence of vegetation/infective endocarditis on this transesophageael echocardiogram. FINDINGS  Left Ventricle: Left ventricular ejection fraction, by estimation, is 55 to 60%. The left ventricle has normal function. The left ventricular internal cavity size was normal in size. Right Ventricle: The right ventricular size is normal. No increase in right ventricular wall thickness. Right ventricular systolic function is normal. Left Atrium: Left atrial size was normal in size. No left atrial/left atrial appendage thrombus was detected. Right Atrium: Right atrial size was normal in size. Pericardium: There is no evidence of pericardial effusion. Mitral Valve: The mitral valve is normal in structure. Mild mitral valve regurgitation. Tricuspid Valve: The tricuspid valve is normal in structure. Tricuspid valve regurgitation is not demonstrated. Aortic Valve: The aortic valve is tricuspid. Aortic valve regurgitation is trivial. Pulmonic Valve: The pulmonic valve was normal in structure. Pulmonic valve regurgitation is trivial. Aorta: The aortic root is normal in size and structure. IAS/Shunts: No atrial level shunt detected by color flow Doppler. Debbe Odea MD Electronically signed by Debbe Odea MD Signature Date/Time: 08/06/2022/10:53:55 AM    Final    MR Lumbar Spine W Wo Contrast  Result Date: 08/03/2022 CLINICAL DATA:  Discitis osteomyelitis EXAM: MRI LUMBAR SPINE WITHOUT AND WITH CONTRAST TECHNIQUE: Multiplanar and multiecho pulse sequences of the lumbar spine were obtained without and with intravenous contrast. CONTRAST:  9mL GADAVIST  GADOBUTROL 1 MMOL/ML IV SOLN COMPARISON:  07/20/2022 CT abdomen pelvis, no prior MRI available FINDINGS: Segmentation:  5 lumbar type vertebral bodies. Alignment:  No significant listhesis. Vertebrae: Hyperenhancement of the L1 and L2 vertebral bodies, with findings concerning for cortical disruption about the L1-L2 disc space (series 10, image 10). Increased T2 hyperintense signal in the disc space (series 7, image 10), with hyperenhancement at the posterior aspect of the disc space (series 10, image 9). A peripherally enhancing, T2 hyperintense collection along the posterior aspect of L2 is concerning for epidural abscess. This collection measures up to 6 x 11 x 23 mm (AP x TR x CC) (series 11, image 12 and series 10, image 8). Additional less significant hyperenhancement at L4-L5 (series 10, image 8) with increased T2 signal in the L4-L5 disc space) series 7, image 9) and possible enhancement anteriorly (series 10, image 9). Increased T2 signal is also noted the L5-S1 disc space, possible mild enhancement and T2 hyperintense signal in the adjacent endplates particularly posteriorly (series 10, image 10 and series 7, image 10). No evidence of epidural phlegmon or abscess at these levels. Conus medullaris and cauda equina: Conus extends to the L1 level. Conus and cauda equina appear normal. No abnormal enhancement. Paraspinal and other soft tissues: Mildly increased T2 signal in the posterior paraspinous muscles, without evidence of abscess, possibly myositis. No psoas abscess. Small amount of enhancing material anterior to L1 and L2 (series 10, image 8 and series 11, image 10), likely prevertebral phlegmon. Disc levels: T12-L1: Mild disc bulge. Mild facet arthropathy. No spinal canal stenosis or neural foraminal narrowing. L1-L2: Mild disc bulge. Mild facet arthropathy. Epidural phlegmon narrows the right lateral recess. No spinal canal stenosis. No neural foraminal narrowing. L2-L3: Mild disc bulge. Mild facet  arthropathy, with possible mild enhancement in the left facets (series 11, image 17). Ligamentum flavum hypertrophy. No spinal canal stenosis. No neural foraminal narrowing. L3-L4: Mild disc bulge with left foraminal and extreme lateral disc protrusion. Mild facet arthropathy. No spinal canal stenosis. Mild bilateral neural foraminal narrowing. L4-L5: Mild disc bulge. Mild facet arthropathy. No spinal canal  stenosis. Mild bilateral neural foraminal narrowing. L5-S1: Mild disc bulge. Mild facet arthropathy. No spinal canal stenosis. Mild right neural foraminal narrowing. IMPRESSION: 1. Findings concerning for discitis osteomyelitis at L1-L2, with a small ventral epidural abscess and a small amount of prevertebral phlegmon. The abscess does not cause significant spinal canal stenosis but likely narrows the right lateral recess, possibly affecting descending right L2 nerve root. 2. Increased T2 signal and hyperenhancement in the L4-L5 and L5-S1 disc spaces, concerning for early discitis. No evidence of epidural phlegmon or abscess at these levels. 3. L2-L3 possible facet septic arthritis on the left. 4. Mild neural foraminal narrowing bilaterally at L3-L4 and L4-L5 and on the right at L5-S1. These results will be called to the ordering clinician or representative by the Radiologist Assistant, and communication documented in the PACS or [employer]. Electronically Signed   By: Wiliam Ke M.D.   On: 08/03/2022 02:25   US RENAL  Result Date: 08/02/2022 CLINICAL DATA:  [id number] Acute renal failure (ARF) (HCC) [id number] EXAM: RENAL / URINARY TRACT ULTRASOUND COMPLETE COMPARISON:  CT 07/20/2022. FINDINGS: Right Kidney: Renal measurements: 11.5 x 4.6 x 5.3 cm = volume: 145.4 mL. No hydronephrosis. There is a 12.5 mm echogenic focus in the mid kidney without a correlate on recent CT. No definite nephrolithiasis. Increased cortical echogenicity. Left Kidney: Renal measurements: 13.2 x 7.7 x 5.6 cm = volume: 297.7 mL.  There is a 17.3 mm echogenic focus without correlate on recent CT. No definite nephrolithiasis. Mildly increased cortical echogenicity. Bladder: Appears normal for degree of bladder distention. Other: None. IMPRESSION: No hydronephrosis. Mildly increased renal cortical echogenicity bilaterally, right greater than left, compatible with medical renal disease. Electronically Signed   By: Caprice Renshaw M.D.   On: 08/02/2022 10:23   DG Chest Port 1 View  Result Date: 08/01/2022 CLINICAL DATA:  Fever. EXAM: PORTABLE CHEST 1 VIEW COMPARISON:  Jul 27, 2022. FINDINGS: Stable cardiomediastinal silhouette. Stable bibasilar atelectasis or infiltrates are noted, right greater than left. Bony thorax is unremarkable. IMPRESSION: Stable bibasilar opacities as described above. Electronically Signed   By: Lupita Raider M.D.   On: 08/01/2022 14:28   MR BRAIN WO CONTRAST  Result Date: 08/01/2022 EXAM: MRI HEAD WITHOUT CONTRAST TECHNIQUE: Multiplanar, multiecho pulse sequences of the brain and surrounding structures were obtained without intravenous contrast. COMPARISON:  MRI of the brain December 26, 2020. FINDINGS: Brain: No acute infarction, hemorrhage, hydrocephalus, extra-axial collection or mass lesion. Scattered and confluent foci T2 air seen within the white matter of the cerebral hemispheres, nonspecific, most likely related to chronic small vessel ischemia, not significantly changed compared to prior MRI. Mild parenchymal volume loss. Vascular: Normal flow voids. Skull and upper cervical spine: Normal marrow signal. Sinuses/Orbits: Bilateral mastoid effusion. Paranasal sinuses are clear. The orbits are maintained. Other: None. IMPRESSION: 1. No acute intracranial abnormality. 2. Moderate chronic microvascular ischemic changes of the white matter, stable. Electronically Signed   By: Baldemar Lenis M.D.   On: 08/01/2022 13:15   DG Knee 1-2 Views Right  Result Date: 07/31/2022 CLINICAL DATA:  Right knee  pain. EXAM: RIGHT KNEE - 1-2 VIEW COMPARISON:  None Available. FINDINGS: The mineralization and alignment are normal. There is no evidence of acute fracture or dislocation. Minimal medial compartment joint space narrowing. There is a moderate size knee joint effusion. Prominent vascular calcifications are noted. No evidence of foreign body. IMPRESSION: Moderate-sized knee joint effusion with minimal medial compartment joint space narrowing. No acute osseous findings. Electronically Signed  By: Carey Bullocks M.D.   On: 07/31/2022 19:07   DG Lumbar Spine 2-3 Views  Result Date: 07/29/2022 CLINICAL DATA:  Low back pain due to bilateral sciatica. EXAM: LUMBAR SPINE - 2-3 VIEW COMPARISON:  Lumbar spine radiographs 01/10/2020. CTA abdomen and pelvis 07/20/2022. FINDINGS: There are 5 non rib-bearing lumbar type vertebrae. There is straightening of the normal lumbar lordosis with minimal retrolisthesis of L2 on L3 and L5 on S1 and minimal anterolisthesis of L4 on L5. No acute fracture is identified in the lumbar spine. Assessment of T11 and T12 on the lateral radiograph is limited by slight obliquity and superimposed densities. Disc degeneration is noted diffusely in the lumbar spine, including severe disc space narrowing at L4-5 and L5-S1 which is similar to the prior radiographs. A new aortoiliac stent graft is in place. There is gaseous distension of multiple bowel loops, incompletely evaluated. IMPRESSION: 1. No acute osseous abnormality identified in the lumbar spine. 2. Advanced lower lumbar disc degeneration. 3. Partially visualized gaseous distension of multiple bowel loops, incompletely evaluated but may reflect ileus. Abdominal radiographs could be obtained if clinically warranted. Electronically Signed   By: Sebastian Ache M.D.   On: 07/29/2022 12:01   DG Chest Port 1 View  Result Date: 07/27/2022 CLINICAL DATA:  Decreased breath sounds at the left lung base. EXAM: PORTABLE CHEST 1 VIEW COMPARISON:   Radiograph 07/20/2022 FINDINGS: There are increasing heterogeneous opacities in both lung bases, greatest in the retrocardiac region. There may be slight vascular congestion. Stable heart size and mediastinal contours. Aortic atherosclerosis. No large pleural effusion. No pneumothorax. IMPRESSION: Increasing heterogeneous opacities in both lung bases, greatest in the retrocardiac region, may be atelectasis or pneumonia. Electronically Signed   By: Narda Rutherford M.D.   On: 07/27/2022 19:52   PERIPHERAL VASCULAR CATHETERIZATION  Result Date: 07/24/2022 See surgical note for result.  ECHOCARDIOGRAM COMPLETE  Result Date: 07/22/2022    ECHOCARDIOGRAM REPORT   Patient Name:   THIMOTHY REICKS Date of Exam: 07/22/2022 Medical Rec #:  [id number]      Height:       72.0 in Accession #:    [id number]     Weight:       194.0 lb Date of Birth:  June 03, 1935     BSA:          2.103 m Patient Age:    86 years       BP:           156/67 mmHg Patient Gender: M              HR:           84 bpm. Exam Location:  [facility] Procedure: 2D Echo, Cardiac Doppler and Color Doppler Indications:     CAD native vessel I25.10  History:         Patient has prior history of Echocardiogram examinations, most                  recent 02/08/2016. CAD; Risk Factors:Hypertension.  Sonographer:     Cristela Blue Referring Phys:  [id number] MIECHIA A ESCO Diagnosing Phys: Lorine Bears MD IMPRESSIONS  1. Left ventricular ejection fraction, by estimation, is 55 to 60%. The left ventricle has normal function. The left ventricle has no regional wall motion abnormalities. Left ventricular diastolic parameters are consistent with Grade I diastolic dysfunction (impaired relaxation).  2. Right ventricular systolic function is normal. The right ventricular size is normal. There is normal pulmonary artery systolic  pressure.  3. The mitral valve is normal in structure. Mild to moderate mitral valve regurgitation. No evidence of mitral stenosis. Moderate mitral  annular calcification.  4. The aortic valve is normal in structure. Aortic valve regurgitation is trivial. Aortic valve sclerosis is present, with no evidence of aortic valve stenosis.  5. The inferior vena cava is normal in size with greater than 50% respiratory variability, suggesting right atrial pressure of 3 mmHg. FINDINGS  Left Ventricle: Left ventricular ejection fraction, by estimation, is 55 to 60%. The left ventricle has normal function. The left ventricle has no regional wall motion abnormalities. The left ventricular internal cavity size was normal in size. There is  borderline left ventricular hypertrophy. Left ventricular diastolic parameters are consistent with Grade I diastolic dysfunction (impaired relaxation). Right Ventricle: The right ventricular size is normal. No increase in right ventricular wall thickness. Right ventricular systolic function is normal. There is normal pulmonary artery systolic pressure. The tricuspid regurgitant velocity is 2.42 m/s, and  with an assumed right atrial pressure of 3 mmHg, the estimated right ventricular systolic pressure is 26.4 mmHg. Left Atrium: Left atrial size was normal in size. Right Atrium: Right atrial size was normal in size. Pericardium: There is no evidence of pericardial effusion. Mitral Valve: The mitral valve is normal in structure. There is moderate thickening of the mitral valve leaflet(s). There is mild calcification of the mitral valve leaflet(s). Moderate mitral annular calcification. Mild to moderate mitral valve regurgitation. No evidence of mitral valve stenosis. MV peak gradient, 5.2 mmHg. The mean mitral valve gradient is 2.0 mmHg. Tricuspid Valve: The tricuspid valve is normal in structure. Tricuspid valve regurgitation is mild . No evidence of tricuspid stenosis. Aortic Valve: The aortic valve is normal in structure. Aortic valve regurgitation is trivial. Aortic valve sclerosis is present, with no evidence of aortic valve stenosis.  Aortic valve mean gradient measures 5.0 mmHg. Aortic valve peak gradient measures 8.1 mmHg. Aortic valve area, by VTI measures 3.15 cm. Pulmonic Valve: The pulmonic valve was normal in structure. Pulmonic valve regurgitation is not visualized. No evidence of pulmonic stenosis. Aorta: The aortic root is normal in size and structure. Venous: The inferior vena cava is normal in size with greater than 50% respiratory variability, suggesting right atrial pressure of 3 mmHg. IAS/Shunts: No atrial level shunt detected by color flow Doppler.  LEFT VENTRICLE PLAX 2D LVIDd:         4.80 cm   Diastology LVIDs:         3.50 cm   LV e' medial:    8.16 cm/s LV PW:         1.10 cm   LV E/e' medial:  9.8 LV IVS:        1.00 cm   LV e' lateral:   9.14 cm/s LVOT diam:     2.20 cm   LV E/e' lateral: 8.8 LV SV:         79 LV SV Index:   38 LVOT Area:     3.80 cm  RIGHT VENTRICLE RV Basal diam:  4.60 cm RV Mid diam:    3.10 cm RV S prime:     14.90 cm/s TAPSE (M-mode): 2.4 cm LEFT ATRIUM             Index        RIGHT ATRIUM           Index LA diam:        3.40 cm 1.62 cm/m  RA Area:     14.40 cm LA Vol (A2C):   56.1 ml 26.67 ml/m  RA Volume:   28.90 ml  13.74 ml/m LA Vol (A4C):   41.4 ml 19.68 ml/m LA Biplane Vol: 49.6 ml 23.58 ml/m  AORTIC VALVE AV Area (Vmax):    2.56 cm AV Area (Vmean):   2.64 cm AV Area (VTI):     3.15 cm AV Vmax:           142.00 cm/s AV Vmean:          98.700 cm/s AV VTI:            0.252 m AV Peak Grad:      8.1 mmHg AV Mean Grad:      5.0 mmHg LVOT Vmax:         95.80 cm/s LVOT Vmean:        68.500 cm/s LVOT VTI:          0.209 m LVOT/AV VTI ratio: 0.83  AORTA Ao Root diam: 3.50 cm MITRAL VALVE                TRICUSPID VALVE MV Area (PHT): 4.01 cm     TR Peak grad:   23.4 mmHg MV Area VTI:   2.63 cm     TR Vmax:        242.00 cm/s MV Peak grad:  5.2 mmHg MV Mean grad:  2.0 mmHg     SHUNTS MV Vmax:       1.14 m/s     Systemic VTI:  0.21 m MV Vmean:      60.7 cm/s    Systemic Diam: 2.20 cm MV Decel  Time: 189 msec MV E velocity: 80.20 cm/s MV A velocity: 115.00 cm/s MV E/A ratio:  0.70 Lorine Bears MD Electronically signed by Lorine Bears MD Signature Date/Time: 07/22/2022/11:10:37 AM    Final    DG Chest Portable 1 View  Result Date: 07/20/2022 CLINICAL DATA:  Bilateral lower back pain EXAM: PORTABLE CHEST 1 VIEW COMPARISON:  Chest radiograph dated 09/19/2019 FINDINGS: Normal lung volumes. No focal consolidations. No pleural effusion or pneumothorax. The heart size and mediastinal contours are within normal limits. Dextrocurvature of the upper thoracic spine may be related to patient positioning. IMPRESSION: No active disease. Electronically Signed   By: Agustin Cree M.D.   On: 07/20/2022 21:29   CT Angio Abd/Pel W and/or Wo Contrast  Result Date: 07/20/2022 CLINICAL DATA:  Aneurysm, renal or visceral. AAA suspected. Bilateral low back pain. EXAM: CTA ABDOMEN AND PELVIS WITHOUT AND WITH CONTRAST TECHNIQUE: Multidetector CT imaging of the abdomen and pelvis was performed using the standard protocol during bolus administration of intravenous contrast. Multiplanar reconstructed images and MIPs were obtained and reviewed to evaluate the vascular anatomy. RADIATION DOSE REDUCTION: This exam was performed according to the departmental dose-optimization program which includes automated exposure control, adjustment of the mA and/or kV according to patient size and/or use of iterative reconstruction technique. CONTRAST:  80mL OMNIPAQUE IOHEXOL 350 MG/ML SOLN COMPARISON:  CT 04/09/2021 FINDINGS: VASCULAR Aorta: Moderate mixed density atherosclerotic plaque in the abdominal aorta. 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 The aorta measures 27 mm in maximum dimension at this level. No aneurysm or dissection. Celiac: No aneurysm or dissection. Predominantly calcified plaque at the origin  causes advanced narrowing. SMA: No aneurysm or dissection. Predominantly calcified plaque  at the origin causes advanced narrowing. Additional calcified plaque in the proximal SMA causes moderate narrowing. Renals: No aneurysm or dissection. Calcified plaque at the origins of both renal arteries causes advanced narrowing bilaterally. Accessory left renal artery. IMA: Patent. Inflow: Scattered predominantly calcified atherosclerotic plaque causes multifocal areas of mild narrowing. No aneurysm or dissection. Proximal Outflow: No aneurysm, dissection, or occlusion. Veins: No obvious venous abnormality within the limitations of this arterial phase study. Review of the MIP images confirms the above findings. NON-VASCULAR Lower chest: Bibasilar scarring/atelectasis.  No acute abnormality. Hepatobiliary: Normal parenchymal attenuation and hepatic contour. Unremarkable gallbladder and biliary tree. Pancreas: Unremarkable. Spleen: Unremarkable. Adrenals/Urinary Tract: Stable adrenal glands. No urinary calculi or hydronephrosis. Unremarkable bladder. Stomach/Bowel: Normal caliber large and small bowel. Colonic diverticulosis without diverticulitis. Stomach is within normal limits. Small hiatal hernia. The appendix is not definitively visualized. No secondary signs of appendicitis. Lymphatic: No lymphadenopathy. Reproductive: Unremarkable. Other: No free intraperitoneal fluid or air. Musculoskeletal: Thoracolumbar spondylosis. Intraosseous ganglion cyst as evaluated on MRI 05/25/2021 in the posterior left acetabulum. No acute osseous abnormality. IMPRESSION: 1. 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 No aneurysm or dissection. 2. 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 3. Colonic diverticulosis without diverticulitis. 4. Small hiatal hernia. Aortic Atherosclerosis (ICD10-I70.0). Electronically Signed   By: Minerva Fester M.D.   On: 07/20/2022 17:18    Microbiology: Results for orders placed or performed during the hospital encounter of 07/20/22  Culture, blood (Routine X 2) w Reflex to ID Panel     Status: Abnormal   Collection Time: 08/01/22 12:37 PM   Specimen: BLOOD RIGHT ARM  Result Value Ref Range Status   Specimen Description   Final    BLOOD RIGHT ARM Performed at [facility], [address] [place], [place] [address]    Special Requests   Final    BOTTLES DRAWN AEROBIC AND ANAEROBIC Blood Culture adequate volume Performed at [facility], [address], [place], [place] [address]    Culture  Setup Time   Final    GRAM POSITIVE COCCI IN BOTH AEROBIC AND ANAEROBIC BOTTLES CRITICAL VALUE NOTED.  VALUE IS CONSISTENT WITH PREVIOUSLY REPORTED AND CALLED VALUE. Performed at [facility], [address] [place], [place] [address]    Culture (A)  Final    STAPHYLOCOCCUS AUREUS SUSCEPTIBILITIES PERFORMED ON PREVIOUS CULTURE WITHIN THE LAST 5 DAYS. Performed at [facility], [address] [place], [place] [address]    Report Status 08/04/2022 FINAL  Final  Culture, blood (Routine X 2) w Reflex to ID Panel     Status: Abnormal   Collection Time: 08/01/22  1:04 PM   Specimen: BLOOD RIGHT ARM  Result Value Ref Range Status   Specimen Description   Final    BLOOD RIGHT ARM Performed at [facility], [address], [place], [place] [address]    Special Requests   Final    BOTTLES DRAWN AEROBIC AND ANAEROBIC Blood Culture adequate volume Performed at [facility], [address], [place], [place] [address]    Culture  Setup Time   Final    GRAM POSITIVE COCCI IN BOTH AEROBIC AND ANAEROBIC BOTTLES Organism ID to follow CRITICAL RESULT CALLED TO, READ BACK BY AND VERIFIED WITH: Elenora Gamma, PHARMD AT 1128 ON 08/01/22 BY GM Performed at [facility], [address] [place], [place]  [address]    Culture STAPHYLOCOCCUS  AUREUS (A)  Final   Report Status 08/04/2022 FINAL  Final   Organism ID, Bacteria STAPHYLOCOCCUS AUREUS  Final      Susceptibility   Staphylococcus aureus - MIC*    CIPROFLOXACIN <=0.5 SENSITIVE Sensitive     ERYTHROMYCIN RESISTANT Resistant     GENTAMICIN <=0.5 SENSITIVE Sensitive     OXACILLIN 0.5 SENSITIVE Sensitive     TETRACYCLINE <=1 SENSITIVE Sensitive     VANCOMYCIN <=0.5 SENSITIVE Sensitive     TRIMETH/SULFA <=10 SENSITIVE Sensitive     CLINDAMYCIN RESISTANT Resistant     RIFAMPIN <=0.5 SENSITIVE Sensitive     Inducible Clindamycin POSITIVE Resistant     LINEZOLID 2 SENSITIVE Sensitive     * STAPHYLOCOCCUS AUREUS  Blood Culture ID Panel (Reflexed)     Status: Abnormal   Collection Time: 08/01/22  1:04 PM  Result Value Ref Range Status   Enterococcus faecalis NOT DETECTED NOT DETECTED Final   Enterococcus Faecium NOT DETECTED NOT DETECTED Final   Listeria monocytogenes NOT DETECTED NOT DETECTED Final   Staphylococcus species DETECTED (A) NOT DETECTED Final    Comment: CRITICAL RESULT CALLED TO, READ BACK BY AND VERIFIED WITH: NATHAN BLUE, PHARMD AT 1128 ON 08/01/22 BY GM    Staphylococcus aureus (BCID) DETECTED (A) NOT DETECTED Final    Comment: CRITICAL RESULT CALLED TO, READ BACK BY AND VERIFIED WITH: NATHAN BLUE, PHARMD AT 1128 ON 08/01/22 BY GM    Staphylococcus epidermidis NOT DETECTED NOT DETECTED Final   Staphylococcus lugdunensis NOT DETECTED NOT DETECTED Final   Streptococcus species NOT DETECTED NOT DETECTED Final   Streptococcus agalactiae NOT DETECTED NOT DETECTED Final   Streptococcus pneumoniae NOT DETECTED NOT DETECTED Final   Streptococcus pyogenes NOT DETECTED NOT DETECTED Final   A.calcoaceticus-baumannii NOT DETECTED NOT DETECTED Final   Bacteroides fragilis NOT DETECTED NOT DETECTED Final   Enterobacterales NOT DETECTED NOT DETECTED Final   Enterobacter cloacae complex NOT DETECTED NOT DETECTED Final   Escherichia coli NOT DETECTED NOT DETECTED Final    Klebsiella aerogenes NOT DETECTED NOT DETECTED Final   Klebsiella oxytoca NOT DETECTED NOT DETECTED Final   Klebsiella pneumoniae NOT DETECTED NOT DETECTED Final   Proteus species NOT DETECTED NOT DETECTED Final   Salmonella species NOT DETECTED NOT DETECTED Final   Serratia marcescens NOT DETECTED NOT DETECTED Final   Haemophilus influenzae NOT DETECTED NOT DETECTED Final   Neisseria meningitidis NOT DETECTED NOT DETECTED Final   Pseudomonas aeruginosa NOT DETECTED NOT DETECTED Final   Stenotrophomonas maltophilia NOT DETECTED NOT DETECTED Final   Candida albicans NOT DETECTED NOT DETECTED Final   Candida auris NOT DETECTED NOT DETECTED Final   Candida glabrata NOT DETECTED NOT DETECTED Final   Candida krusei NOT DETECTED NOT DETECTED Final   Candida parapsilosis NOT DETECTED NOT DETECTED Final   Candida tropicalis NOT DETECTED NOT DETECTED Final   Cryptococcus neoformans/gattii NOT DETECTED NOT DETECTED Final   Meth resistant mecA/C and MREJ NOT DETECTED NOT DETECTED Final    Comment: Performed at [facility], [address] [place], [place] [address]  SARS Coronavirus 2 by RT PCR (hospital order, performed in [facility] hospital lab) *cepheid single result test* Anterior Nasal Swab     Status: None   Collection Time: 08/01/22  2:43 PM   Specimen: Anterior Nasal Swab  Result Value Ref Range Status   SARS Coronavirus 2 by RT PCR NEGATIVE NEGATIVE Final    Comment: (NOTE) SARS-CoV-2 target nucleic acids  are NOT DETECTED.  The SARS-CoV-2 RNA is generally detectable in upper and lower respiratory specimens during the acute phase of infection. The lowest concentration of SARS-CoV-2 viral copies this assay can detect is 250 copies / mL. A negative result does not preclude SARS-CoV-2 infection and should not be used as the sole basis for treatment or other patient management decisions.  A negative result may occur with improper specimen collection / handling, submission of  specimen other than nasopharyngeal swab, presence of viral mutation(s) within the areas targeted by this assay, and inadequate number of viral copies (<250 copies / mL). A negative result must be combined with clinical observations, patient history, and epidemiological information.  Fact Sheet for Patients:   RoadLapTop.co.za  Fact Sheet for Healthcare Providers: http://kim-miller.com/  This test is not yet approved or  cleared by the [place] FDA and has been authorized for detection and/or diagnosis of SARS-CoV-2 by FDA under an Emergency Use Authorization (EUA).  This EUA will remain in effect (meaning this test can be used) for the duration of the COVID-19 declaration under Section 564(b)(1) of the Act, 21 U.S.C. section 360bbb-3(b)(1), unless the authorization is terminated or revoked sooner.  Performed at [facility], [address] [place], [place] [address]   Surgical pcr screen     Status: Abnormal   Collection Time: 08/01/22  4:29 PM   Specimen: Nasal Mucosa; Nasal Swab  Result Value Ref Range Status   MRSA, PCR NEGATIVE NEGATIVE Final   Staphylococcus aureus POSITIVE (A) NEGATIVE Final    Comment: (NOTE) The Xpert SA Assay (FDA approved for NASAL specimens in patients 21 years of age and older), is one component of a comprehensive surveillance program. It is not intended to diagnose infection nor to guide or monitor treatment. Performed at [facility], [address] [place], [place] [address]   Culture, blood (Routine X 2) w Reflex to ID Panel     Status: None   Collection Time: 08/03/22 12:37 AM   Specimen: BLOOD  Result Value Ref Range Status   Specimen Description BLOOD BLOOD RIGHT ARM  Final   Special Requests   Final    BOTTLES DRAWN AEROBIC AND ANAEROBIC Blood Culture adequate volume   Culture   Final    NO GROWTH 5 DAYS Performed at [facility], [address]  [place], [place] [address]    Report Status 08/08/2022 FINAL  Final  Culture, blood (Routine X 2) w Reflex to ID Panel     Status: None   Collection Time: 08/03/22 12:37 AM   Specimen: BLOOD  Result Value Ref Range Status   Specimen Description BLOOD BLOOD LEFT HAND  Final   Special Requests   Final    BOTTLES DRAWN AEROBIC AND ANAEROBIC Blood Culture adequate volume   Culture   Final    NO GROWTH 5 DAYS Performed at [facility], [address] [place], [place] [address]    Report Status 08/08/2022 FINAL  Final    Labs: CBC: Recent Labs  Lab 08/04/22 0616 08/06/22 0554 08/08/22 0430  WBC 10.7* 8.3 7.6  HGB 10.3* 9.4* 9.2*  HCT 30.1* 27.6* 28.0*  MCV 92.3 92.9 95.6  PLT 215 204 228   Basic Metabolic Panel: Recent Labs  Lab 08/04/22 0616 08/05/22 0622 08/06/22 0554 08/07/22 0515 08/08/22 0430  NA 132* 136 136 133* 135  K 3.7 4.1 4.0 4.3 4.2  CL 104 108 108 105 107  CO2 20* 22 21* 23  23  GLUCOSE 103* 108* 120* 97 101*  BUN 86* 72* 62* 50* 47*  CREATININE 2.31* 1.93* 1.71* 1.67* 1.58*  CALCIUM 7.9* 8.0* 8.0* 7.8* 7.9*  MG 2.8*  --  2.4  --   --    Liver Function Tests: No results for input(s): "AST", "ALT", "ALKPHOS", "BILITOT", "PROT", "ALBUMIN" in the last 168 hours. CBG: No results for input(s): "GLUCAP" in the last 168 hours.  Discharge time spent: greater than 30 minutes.  Signed: Delfino Lovett, MD [facility] 08/09/2022

## 2022-08-09 NOTE — TOC Transition Note (Addendum)
Transition of Care [facility]) - CM/SW Discharge Note   Patient Details  Name: Brett Riley MRN: [id number] Date of Birth: May 04, 1935  [facility] CM/SW Contact:  Margarito Liner, LCSW Phone Number: 08/09/2022, 10:17 AM   Clinical Narrative:   Patient has orders to discharge to [facility] today. Auth approved: [id number]. Valid 6/7-6/19. RN will call report to [phone] (Room 601B). EMS transport has been arranged and he is 1st on the list. No further concerns. CSW signing off.  Final next level of care: Skilled Nursing Facility Barriers to Discharge: Barriers Resolved   Patient Goals and CMS Choice   Choice offered to / list presented to : Spouse  Discharge Placement PASRR number recieved: 07/30/22 PASRR number recieved: 07/30/22            Patient chooses bed at: [facility] Patient to be transferred to facility by: EMS Name of family member notified: Gerlene Fee Patient and family notified of of transfer: 08/09/22  Discharge Plan and Services Additional resources added to the After Visit Summary for                                       Social Determinants of Health (SDOH) Interventions SDOH Screenings   Food Insecurity: No Food Insecurity (07/20/2022)  Housing: Low Risk  (07/20/2022)  Transportation Needs: No Transportation Needs (07/20/2022)  Utilities: Not At Risk (07/20/2022)  Depression (PHQ2-9): Low Risk  (06/06/2022)  Financial Resource Strain: Low Risk  (08/29/2020)  Physical Activity: Insufficiently Active (08/29/2020)  Social Connections: Unknown (08/29/2020)  Stress: No Stress Concern Present (08/29/2020)  Tobacco Use: Low Risk  (08/06/2022)     Readmission Risk Interventions     No data to display

## 2022-08-09 NOTE — Plan of Care (Signed)
PMT up to bedside to assess patient, and patient has been discharged to SNF. Per PT, TENS will be delivered to patient at his SNF. PMT signed off.

## 2022-08-12 DIAGNOSIS — M4626 Osteomyelitis of vertebra, lumbar region: Secondary | ICD-10-CM | POA: Diagnosis not present

## 2022-08-12 DIAGNOSIS — I700 Atherosclerosis of aorta: Secondary | ICD-10-CM | POA: Diagnosis not present

## 2022-08-12 DIAGNOSIS — R7881 Bacteremia: Secondary | ICD-10-CM | POA: Diagnosis not present

## 2022-08-12 LAB — LAB REPORT - SCANNED: EGFR: 48

## 2022-08-13 ENCOUNTER — Other Ambulatory Visit: Payer: Medicare HMO

## 2022-08-13 DIAGNOSIS — Z515 Encounter for palliative care: Secondary | ICD-10-CM

## 2022-08-15 DIAGNOSIS — N1831 Chronic kidney disease, stage 3a: Secondary | ICD-10-CM | POA: Diagnosis not present

## 2022-08-15 DIAGNOSIS — M4626 Osteomyelitis of vertebra, lumbar region: Secondary | ICD-10-CM | POA: Diagnosis not present

## 2022-08-15 DIAGNOSIS — R945 Abnormal results of liver function studies: Secondary | ICD-10-CM | POA: Diagnosis not present

## 2022-08-15 DIAGNOSIS — R7881 Bacteremia: Secondary | ICD-10-CM | POA: Diagnosis not present

## 2022-08-15 NOTE — Progress Notes (Signed)
  TELEPHONE ENCOUNTER   PC SW connected with patients spouse, to discuss and review new PC referral. Spouse shared that she was not aware of PC referral, after SW briefly reviewing PC services  spouse shared that she is interested in scheduling an initial consultation visit to discuss patient needs further. Spouse share that since hospitalization patient has been completely bed bound and unable to complete ADL's independently. Spouse share that patient has recently started [facility] therapy yesterday and are eager to see what prgression patient will make.  Plan: In home pc visit scheduled for 08/23/22.

## 2022-08-16 DIAGNOSIS — K5909 Other constipation: Secondary | ICD-10-CM | POA: Diagnosis not present

## 2022-08-16 DIAGNOSIS — M4626 Osteomyelitis of vertebra, lumbar region: Secondary | ICD-10-CM | POA: Diagnosis not present

## 2022-08-16 DIAGNOSIS — I10 Essential (primary) hypertension: Secondary | ICD-10-CM | POA: Diagnosis not present

## 2022-08-18 ENCOUNTER — Other Ambulatory Visit: Payer: Self-pay

## 2022-08-18 ENCOUNTER — Inpatient Hospital Stay
Admission: EM | Admit: 2022-08-18 | Discharge: 2022-09-25 | DRG: 286 | Disposition: A | Discharge status: Home / Self Care | Payer: Medicare HMO | Attending: Internal Medicine | Admitting: Internal Medicine

## 2022-08-18 ENCOUNTER — Encounter: Payer: Self-pay | Admitting: Emergency Medicine

## 2022-08-18 ENCOUNTER — Emergency Department: Payer: Medicare HMO

## 2022-08-18 DIAGNOSIS — Z515 Encounter for palliative care: Secondary | ICD-10-CM | POA: Diagnosis not present

## 2022-08-18 DIAGNOSIS — J9601 Acute respiratory failure with hypoxia: Secondary | ICD-10-CM | POA: Diagnosis not present

## 2022-08-18 DIAGNOSIS — R918 Other nonspecific abnormal finding of lung field: Secondary | ICD-10-CM | POA: Diagnosis not present

## 2022-08-18 DIAGNOSIS — I482 Chronic atrial fibrillation, unspecified: Secondary | ICD-10-CM | POA: Diagnosis not present

## 2022-08-18 DIAGNOSIS — E876 Hypokalemia: Secondary | ICD-10-CM | POA: Diagnosis present

## 2022-08-18 DIAGNOSIS — N183 Chronic kidney disease, stage 3 unspecified: Secondary | ICD-10-CM

## 2022-08-18 DIAGNOSIS — M4626 Osteomyelitis of vertebra, lumbar region: Secondary | ICD-10-CM | POA: Diagnosis present

## 2022-08-18 DIAGNOSIS — R768 Other specified abnormal immunological findings in serum: Secondary | ICD-10-CM | POA: Diagnosis present

## 2022-08-18 DIAGNOSIS — Z85828 Personal history of other malignant neoplasm of skin: Secondary | ICD-10-CM

## 2022-08-18 DIAGNOSIS — I2510 Atherosclerotic heart disease of native coronary artery without angina pectoris: Secondary | ICD-10-CM | POA: Diagnosis present

## 2022-08-18 DIAGNOSIS — I719 Aortic aneurysm of unspecified site, without rupture: Secondary | ICD-10-CM | POA: Diagnosis not present

## 2022-08-18 DIAGNOSIS — I10 Essential (primary) hypertension: Secondary | ICD-10-CM | POA: Diagnosis present

## 2022-08-18 DIAGNOSIS — G9340 Encephalopathy, unspecified: Secondary | ICD-10-CM | POA: Diagnosis present

## 2022-08-18 DIAGNOSIS — E878 Other disorders of electrolyte and fluid balance, not elsewhere classified: Secondary | ICD-10-CM | POA: Diagnosis present

## 2022-08-18 DIAGNOSIS — Z1152 Encounter for screening for COVID-19: Secondary | ICD-10-CM

## 2022-08-18 DIAGNOSIS — G928 Other toxic encephalopathy: Secondary | ICD-10-CM | POA: Diagnosis present

## 2022-08-18 DIAGNOSIS — Z7902 Long term (current) use of antithrombotics/antiplatelets: Secondary | ICD-10-CM

## 2022-08-18 DIAGNOSIS — N1831 Chronic kidney disease, stage 3a: Secondary | ICD-10-CM | POA: Diagnosis present

## 2022-08-18 DIAGNOSIS — I130 Hypertensive heart and chronic kidney disease with heart failure and stage 1 through stage 4 chronic kidney disease, or unspecified chronic kidney disease: Secondary | ICD-10-CM | POA: Diagnosis present

## 2022-08-18 DIAGNOSIS — D696 Thrombocytopenia, unspecified: Secondary | ICD-10-CM | POA: Diagnosis present

## 2022-08-18 DIAGNOSIS — R0602 Shortness of breath: Secondary | ICD-10-CM | POA: Diagnosis present

## 2022-08-18 DIAGNOSIS — I5033 Acute on chronic diastolic (congestive) heart failure: Secondary | ICD-10-CM | POA: Diagnosis present

## 2022-08-18 DIAGNOSIS — R0600 Dyspnea, unspecified: Secondary | ICD-10-CM | POA: Diagnosis not present

## 2022-08-18 DIAGNOSIS — Z803 Family history of malignant neoplasm of breast: Secondary | ICD-10-CM

## 2022-08-18 DIAGNOSIS — R809 Proteinuria, unspecified: Secondary | ICD-10-CM | POA: Diagnosis not present

## 2022-08-18 DIAGNOSIS — Z95828 Presence of other vascular implants and grafts: Secondary | ICD-10-CM | POA: Diagnosis not present

## 2022-08-18 DIAGNOSIS — D62 Acute posthemorrhagic anemia: Secondary | ICD-10-CM | POA: Diagnosis present

## 2022-08-18 DIAGNOSIS — B9561 Methicillin susceptible Staphylococcus aureus infection as the cause of diseases classified elsewhere: Secondary | ICD-10-CM | POA: Diagnosis present

## 2022-08-18 DIAGNOSIS — I509 Heart failure, unspecified: Secondary | ICD-10-CM | POA: Diagnosis not present

## 2022-08-18 DIAGNOSIS — T502X5A Adverse effect of carbonic-anhydrase inhibitors, benzothiadiazides and other diuretics, initial encounter: Secondary | ICD-10-CM | POA: Diagnosis present

## 2022-08-18 DIAGNOSIS — I4891 Unspecified atrial fibrillation: Secondary | ICD-10-CM | POA: Diagnosis not present

## 2022-08-18 DIAGNOSIS — I700 Atherosclerosis of aorta: Secondary | ICD-10-CM | POA: Diagnosis not present

## 2022-08-18 DIAGNOSIS — R0609 Other forms of dyspnea: Secondary | ICD-10-CM | POA: Diagnosis not present

## 2022-08-18 DIAGNOSIS — I25118 Atherosclerotic heart disease of native coronary artery with other forms of angina pectoris: Secondary | ICD-10-CM | POA: Diagnosis not present

## 2022-08-18 DIAGNOSIS — M4620 Osteomyelitis of vertebra, site unspecified: Secondary | ICD-10-CM | POA: Diagnosis not present

## 2022-08-18 DIAGNOSIS — I5023 Acute on chronic systolic (congestive) heart failure: Secondary | ICD-10-CM | POA: Diagnosis not present

## 2022-08-18 DIAGNOSIS — Z79899 Other long term (current) drug therapy: Secondary | ICD-10-CM

## 2022-08-18 DIAGNOSIS — J189 Pneumonia, unspecified organism: Principal | ICD-10-CM

## 2022-08-18 DIAGNOSIS — I480 Paroxysmal atrial fibrillation: Secondary | ICD-10-CM

## 2022-08-18 DIAGNOSIS — Z66 Do not resuscitate: Secondary | ICD-10-CM | POA: Diagnosis present

## 2022-08-18 DIAGNOSIS — I5A Non-ischemic myocardial injury (non-traumatic): Secondary | ICD-10-CM | POA: Diagnosis present

## 2022-08-18 DIAGNOSIS — F329 Major depressive disorder, single episode, unspecified: Secondary | ICD-10-CM

## 2022-08-18 DIAGNOSIS — J984 Other disorders of lung: Secondary | ICD-10-CM | POA: Diagnosis not present

## 2022-08-18 DIAGNOSIS — R0689 Other abnormalities of breathing: Secondary | ICD-10-CM | POA: Diagnosis not present

## 2022-08-18 DIAGNOSIS — A419 Sepsis, unspecified organism: Secondary | ICD-10-CM

## 2022-08-18 DIAGNOSIS — K219 Gastro-esophageal reflux disease without esophagitis: Secondary | ICD-10-CM | POA: Diagnosis present

## 2022-08-18 DIAGNOSIS — R0902 Hypoxemia: Secondary | ICD-10-CM | POA: Diagnosis not present

## 2022-08-18 DIAGNOSIS — R5381 Other malaise: Secondary | ICD-10-CM | POA: Diagnosis present

## 2022-08-18 DIAGNOSIS — E7800 Pure hypercholesterolemia, unspecified: Secondary | ICD-10-CM | POA: Diagnosis present

## 2022-08-18 DIAGNOSIS — Z7982 Long term (current) use of aspirin: Secondary | ICD-10-CM

## 2022-08-18 DIAGNOSIS — I472 Ventricular tachycardia, unspecified: Secondary | ICD-10-CM | POA: Diagnosis present

## 2022-08-18 DIAGNOSIS — I5031 Acute diastolic (congestive) heart failure: Secondary | ICD-10-CM | POA: Diagnosis not present

## 2022-08-18 DIAGNOSIS — R188 Other ascites: Secondary | ICD-10-CM | POA: Diagnosis present

## 2022-08-18 DIAGNOSIS — W19XXXA Unspecified fall, initial encounter: Secondary | ICD-10-CM | POA: Diagnosis not present

## 2022-08-18 DIAGNOSIS — M25561 Pain in right knee: Secondary | ICD-10-CM | POA: Diagnosis not present

## 2022-08-18 DIAGNOSIS — R600 Localized edema: Secondary | ICD-10-CM | POA: Diagnosis not present

## 2022-08-18 DIAGNOSIS — N179 Acute kidney failure, unspecified: Secondary | ICD-10-CM | POA: Diagnosis not present

## 2022-08-18 DIAGNOSIS — J9621 Acute and chronic respiratory failure with hypoxia: Secondary | ICD-10-CM | POA: Diagnosis present

## 2022-08-18 DIAGNOSIS — D649 Anemia, unspecified: Secondary | ICD-10-CM | POA: Diagnosis not present

## 2022-08-18 DIAGNOSIS — Z951 Presence of aortocoronary bypass graft: Secondary | ICD-10-CM

## 2022-08-18 DIAGNOSIS — N170 Acute kidney failure with tubular necrosis: Secondary | ICD-10-CM | POA: Diagnosis present

## 2022-08-18 DIAGNOSIS — Z7401 Bed confinement status: Secondary | ICD-10-CM | POA: Diagnosis not present

## 2022-08-18 DIAGNOSIS — J90 Pleural effusion, not elsewhere classified: Secondary | ICD-10-CM | POA: Diagnosis not present

## 2022-08-18 DIAGNOSIS — Z9889 Other specified postprocedural states: Secondary | ICD-10-CM | POA: Diagnosis not present

## 2022-08-18 DIAGNOSIS — I5030 Unspecified diastolic (congestive) heart failure: Secondary | ICD-10-CM | POA: Diagnosis not present

## 2022-08-18 DIAGNOSIS — K921 Melena: Secondary | ICD-10-CM | POA: Diagnosis present

## 2022-08-18 DIAGNOSIS — M4646 Discitis, unspecified, lumbar region: Secondary | ICD-10-CM | POA: Diagnosis not present

## 2022-08-18 DIAGNOSIS — Z7189 Other specified counseling: Secondary | ICD-10-CM | POA: Diagnosis not present

## 2022-08-18 DIAGNOSIS — T462X5A Adverse effect of other antidysrhythmic drugs, initial encounter: Secondary | ICD-10-CM | POA: Diagnosis not present

## 2022-08-18 DIAGNOSIS — R7881 Bacteremia: Secondary | ICD-10-CM

## 2022-08-18 DIAGNOSIS — R231 Pallor: Secondary | ICD-10-CM | POA: Diagnosis not present

## 2022-08-18 DIAGNOSIS — Z823 Family history of stroke: Secondary | ICD-10-CM

## 2022-08-18 DIAGNOSIS — I5032 Chronic diastolic (congestive) heart failure: Secondary | ICD-10-CM | POA: Diagnosis not present

## 2022-08-18 DIAGNOSIS — E785 Hyperlipidemia, unspecified: Secondary | ICD-10-CM | POA: Diagnosis present

## 2022-08-18 DIAGNOSIS — I4819 Other persistent atrial fibrillation: Secondary | ICD-10-CM | POA: Diagnosis present

## 2022-08-18 DIAGNOSIS — E8809 Other disorders of plasma-protein metabolism, not elsewhere classified: Secondary | ICD-10-CM | POA: Diagnosis present

## 2022-08-18 DIAGNOSIS — J9600 Acute respiratory failure, unspecified whether with hypoxia or hypercapnia: Secondary | ICD-10-CM | POA: Diagnosis not present

## 2022-08-18 DIAGNOSIS — J479 Bronchiectasis, uncomplicated: Secondary | ICD-10-CM | POA: Diagnosis not present

## 2022-08-18 DIAGNOSIS — D631 Anemia in chronic kidney disease: Secondary | ICD-10-CM | POA: Diagnosis not present

## 2022-08-18 DIAGNOSIS — I7090 Unspecified atherosclerosis: Secondary | ICD-10-CM | POA: Diagnosis not present

## 2022-08-18 DIAGNOSIS — Z7901 Long term (current) use of anticoagulants: Secondary | ICD-10-CM

## 2022-08-18 DIAGNOSIS — Z8619 Personal history of other infectious and parasitic diseases: Secondary | ICD-10-CM

## 2022-08-18 DIAGNOSIS — Z751 Person awaiting admission to adequate facility elsewhere: Secondary | ICD-10-CM

## 2022-08-18 DIAGNOSIS — I2489 Other forms of acute ischemic heart disease: Secondary | ICD-10-CM | POA: Diagnosis present

## 2022-08-18 DIAGNOSIS — I739 Peripheral vascular disease, unspecified: Secondary | ICD-10-CM | POA: Diagnosis present

## 2022-08-18 DIAGNOSIS — Z0389 Encounter for observation for other suspected diseases and conditions ruled out: Secondary | ICD-10-CM | POA: Diagnosis not present

## 2022-08-18 DIAGNOSIS — I4821 Permanent atrial fibrillation: Secondary | ICD-10-CM | POA: Diagnosis not present

## 2022-08-18 DIAGNOSIS — R0603 Acute respiratory distress: Secondary | ICD-10-CM | POA: Diagnosis not present

## 2022-08-18 DIAGNOSIS — K6389 Other specified diseases of intestine: Secondary | ICD-10-CM | POA: Diagnosis not present

## 2022-08-18 DIAGNOSIS — I214 Non-ST elevation (NSTEMI) myocardial infarction: Secondary | ICD-10-CM | POA: Diagnosis not present

## 2022-08-18 DIAGNOSIS — N189 Chronic kidney disease, unspecified: Secondary | ICD-10-CM | POA: Diagnosis not present

## 2022-08-18 DIAGNOSIS — H9190 Unspecified hearing loss, unspecified ear: Secondary | ICD-10-CM | POA: Diagnosis present

## 2022-08-18 DIAGNOSIS — Z8249 Family history of ischemic heart disease and other diseases of the circulatory system: Secondary | ICD-10-CM

## 2022-08-18 DIAGNOSIS — R319 Hematuria, unspecified: Secondary | ICD-10-CM | POA: Diagnosis not present

## 2022-08-18 DIAGNOSIS — Z743 Need for continuous supervision: Secondary | ICD-10-CM | POA: Diagnosis not present

## 2022-08-18 DIAGNOSIS — I517 Cardiomegaly: Secondary | ICD-10-CM | POA: Diagnosis not present

## 2022-08-18 LAB — BASIC METABOLIC PANEL WITH GFR
Anion gap: 11 (ref 5–15)
BUN: 31 mg/dL — ABNORMAL HIGH (ref 8–23)
CO2: 22 mmol/L (ref 22–32)
Calcium: 8.2 mg/dL — ABNORMAL LOW (ref 8.9–10.3)
Chloride: 106 mmol/L (ref 98–111)
Creatinine, Ser: 1.5 mg/dL — ABNORMAL HIGH (ref 0.61–1.24)
GFR, Estimated: 46 mL/min — ABNORMAL LOW
Glucose, Bld: 117 mg/dL — ABNORMAL HIGH (ref 70–99)
Potassium: 3.6 mmol/L (ref 3.5–5.1)
Sodium: 139 mmol/L (ref 135–145)

## 2022-08-18 LAB — LIPID PANEL
Cholesterol: 84 mg/dL (ref 0–200)
HDL: 35 mg/dL — ABNORMAL LOW
LDL Cholesterol: 34 mg/dL (ref 0–99)
Total CHOL/HDL Ratio: 2.4 ratio
Triglycerides: 73 mg/dL
VLDL: 15 mg/dL (ref 0–40)

## 2022-08-18 LAB — CBC WITH DIFFERENTIAL/PLATELET
Abs Immature Granulocytes: 0.09 10*3/uL — ABNORMAL HIGH (ref 0.00–0.07)
Basophils Absolute: 0 10*3/uL (ref 0.0–0.1)
Basophils Relative: 0 %
Eosinophils Absolute: 0.1 10*3/uL (ref 0.0–0.5)
Eosinophils Relative: 1 %
HCT: 27.8 % — ABNORMAL LOW (ref 39.0–52.0)
Hemoglobin: 8.7 g/dL — ABNORMAL LOW (ref 13.0–17.0)
Immature Granulocytes: 1 %
Lymphocytes Relative: 11 %
Lymphs Abs: 1.2 10*3/uL (ref 0.7–4.0)
MCH: 30.7 pg (ref 26.0–34.0)
MCHC: 31.3 g/dL (ref 30.0–36.0)
MCV: 98.2 fL (ref 80.0–100.0)
Monocytes Absolute: 0.6 10*3/uL (ref 0.1–1.0)
Monocytes Relative: 5 %
Neutro Abs: 8.9 10*3/uL — ABNORMAL HIGH (ref 1.7–7.7)
Neutrophils Relative %: 82 %
Platelets: 282 10*3/uL (ref 150–400)
RBC: 2.83 MIL/uL — ABNORMAL LOW (ref 4.22–5.81)
RDW: 13.8 % (ref 11.5–15.5)
WBC: 10.8 10*3/uL — ABNORMAL HIGH (ref 4.0–10.5)
nRBC: 0 % (ref 0.0–0.2)

## 2022-08-18 LAB — BLOOD GAS, VENOUS
Acid-Base Excess: 5.4 mmol/L — ABNORMAL HIGH (ref 0.0–2.0)
Bicarbonate: 29.9 mmol/L — ABNORMAL HIGH (ref 20.0–28.0)
O2 Saturation: 77.2 %
Patient temperature: 37
pCO2, Ven: 43 mmHg — ABNORMAL LOW (ref 44–60)
pH, Ven: 7.45 — ABNORMAL HIGH (ref 7.25–7.43)
pO2, Ven: 44 mmHg (ref 32–45)

## 2022-08-18 LAB — HEPARIN LEVEL (UNFRACTIONATED): Heparin Unfractionated: >1.1 [IU]/mL — ABNORMAL HIGH (ref 0.30–0.70)

## 2022-08-18 LAB — LACTIC ACID, PLASMA: Lactic Acid, Venous: 1.8 mmol/L (ref 0.5–1.9)

## 2022-08-18 LAB — PROTIME-INR
INR: 1.9 — ABNORMAL HIGH (ref 0.8–1.2)
Prothrombin Time: 21.6 s — ABNORMAL HIGH (ref 11.4–15.2)

## 2022-08-18 LAB — RESP PANEL BY RT-PCR (FLU A&B, COVID) ARPGX2
Influenza A by PCR: NEGATIVE
Influenza B by PCR: NEGATIVE
SARS Coronavirus 2 by RT PCR: NEGATIVE

## 2022-08-18 LAB — MAGNESIUM: Magnesium: 2 mg/dL (ref 1.7–2.4)

## 2022-08-18 LAB — HEMOGLOBIN A1C
Hgb A1c MFr Bld: 5.4 % (ref 4.8–5.6)
Mean Plasma Glucose: 108.28 mg/dL

## 2022-08-18 LAB — PROCALCITONIN: Procalcitonin: <0.1 ng/mL

## 2022-08-18 LAB — BRAIN NATRIURETIC PEPTIDE: B Natriuretic Peptide: 279.2 pg/mL — ABNORMAL HIGH (ref 0.0–100.0)

## 2022-08-18 LAB — TROPONIN I (HIGH SENSITIVITY)
Troponin I (High Sensitivity): 258 ng/L
Troponin I (High Sensitivity): 1775 ng/L

## 2022-08-18 LAB — APTT: aPTT: 43 s — ABNORMAL HIGH (ref 24–36)

## 2022-08-18 MED ORDER — ALBUTEROL SULFATE (2.5 MG/3ML) 0.083% IN NEBU
2.5000 mg | INHALATION_SOLUTION | RESPIRATORY_TRACT | Status: DC | PRN
  Administered 2022-08-23 – 2022-09-16 (×4): 2.5 mg via RESPIRATORY_TRACT
  Filled 2022-08-18 (×4): qty 3

## 2022-08-18 MED ORDER — PANTOPRAZOLE SODIUM 40 MG PO TBEC
40.0000 mg | DELAYED_RELEASE_TABLET | Freq: Every day | ORAL | Status: DC
  Administered 2022-08-19 – 2022-09-25 (×38): 40 mg via ORAL
  Filled 2022-08-18 (×38): qty 1

## 2022-08-18 MED ORDER — TAB-A-VITE/IRON PO TABS
1.0000 | ORAL_TABLET | Freq: Every day | ORAL | Status: DC
  Filled 2022-08-18 (×2): qty 1

## 2022-08-18 MED ORDER — HYDRALAZINE HCL 20 MG/ML IJ SOLN
5.0000 mg | INTRAMUSCULAR | Status: DC | PRN
  Administered 2022-08-23: 5 mg via INTRAVENOUS
  Filled 2022-08-18: qty 1

## 2022-08-18 MED ORDER — POLYETHYLENE GLYCOL 3350 17 G PO PACK
17.0000 g | PACK | Freq: Every day | ORAL | Status: DC
  Administered 2022-08-19 – 2022-09-02 (×10): 17 g via ORAL
  Filled 2022-08-18 (×18): qty 1

## 2022-08-18 MED ORDER — METOPROLOL SUCCINATE ER 100 MG PO TB24
100.0000 mg | ORAL_TABLET | Freq: Every day | ORAL | Status: DC
  Administered 2022-08-18 – 2022-09-25 (×38): 100 mg via ORAL
  Filled 2022-08-18 (×22): qty 1
  Filled 2022-08-18: qty 2
  Filled 2022-08-18 (×17): qty 1

## 2022-08-18 MED ORDER — VITAMIN B-12 1000 MCG PO TABS
1000.0000 ug | ORAL_TABLET | Freq: Every day | ORAL | Status: DC
  Administered 2022-08-19 – 2022-09-25 (×38): 1000 ug via ORAL
  Filled 2022-08-18 (×38): qty 1

## 2022-08-18 MED ORDER — FUROSEMIDE 10 MG/ML IJ SOLN
40.0000 mg | Freq: Two times a day (BID) | INTRAMUSCULAR | Status: DC
  Administered 2022-08-18: 40 mg via INTRAVENOUS
  Filled 2022-08-18: qty 4

## 2022-08-18 MED ORDER — APIXABAN 2.5 MG PO TABS
2.5000 mg | ORAL_TABLET | Freq: Two times a day (BID) | ORAL | Status: DC
  Filled 2022-08-18 (×2): qty 1

## 2022-08-18 MED ORDER — HEPARIN BOLUS VIA INFUSION
4000.0000 [IU] | Freq: Once | INTRAVENOUS | Status: AC
  Administered 2022-08-18: 4000 [IU] via INTRAVENOUS
  Filled 2022-08-18: qty 4000

## 2022-08-18 MED ORDER — ONDANSETRON HCL 4 MG/2ML IJ SOLN
4.0000 mg | Freq: Three times a day (TID) | INTRAMUSCULAR | Status: DC | PRN
  Administered 2022-09-09 – 2022-09-17 (×3): 4 mg via INTRAVENOUS
  Filled 2022-08-18 (×3): qty 2

## 2022-08-18 MED ORDER — HEPARIN (PORCINE) 25000 UT/250ML-% IV SOLN
1150.0000 [IU]/h | INTRAVENOUS | Status: DC
  Administered 2022-08-18: 1150 [IU]/h via INTRAVENOUS
  Filled 2022-08-18: qty 250

## 2022-08-18 MED ORDER — CHLORHEXIDINE GLUCONATE CLOTH 2 % EX PADS
6.0000 | MEDICATED_PAD | Freq: Every day | CUTANEOUS | Status: DC
  Administered 2022-08-19 – 2022-09-19 (×30): 6 via TOPICAL

## 2022-08-18 MED ORDER — FUROSEMIDE 10 MG/ML IJ SOLN
60.0000 mg | Freq: Once | INTRAMUSCULAR | Status: AC
  Administered 2022-08-18: 60 mg via INTRAVENOUS
  Filled 2022-08-18: qty 8

## 2022-08-18 MED ORDER — ENSURE ENLIVE PO LIQD
237.0000 mL | Freq: Two times a day (BID) | ORAL | Status: DC
  Administered 2022-08-19 – 2022-09-25 (×26): 237 mL via ORAL

## 2022-08-18 MED ORDER — SODIUM CHLORIDE 0.9 % IV SOLN
2.0000 g | Freq: Once | INTRAVENOUS | Status: AC
  Administered 2022-08-18: 2 g via INTRAVENOUS
  Filled 2022-08-18: qty 12.5

## 2022-08-18 MED ORDER — ROSUVASTATIN CALCIUM 10 MG PO TABS
40.0000 mg | ORAL_TABLET | Freq: Every day | ORAL | Status: DC
  Administered 2022-08-18 – 2022-09-07 (×21): 40 mg via ORAL
  Filled 2022-08-18: qty 2
  Filled 2022-08-18 (×13): qty 4
  Filled 2022-08-18: qty 2
  Filled 2022-08-18 (×7): qty 4

## 2022-08-18 MED ORDER — DILTIAZEM HCL ER BEADS 300 MG PO CP24
420.0000 mg | ORAL_CAPSULE | Freq: Every day | ORAL | Status: DC
  Administered 2022-08-19 – 2022-08-20 (×2): 420 mg via ORAL
  Filled 2022-08-18 (×4): qty 1

## 2022-08-18 MED ORDER — CEFAZOLIN SODIUM-DEXTROSE 2-4 GM/100ML-% IV SOLN
2.0000 g | Freq: Three times a day (TID) | INTRAVENOUS | Status: DC
  Administered 2022-08-18 – 2022-08-23 (×15): 2 g via INTRAVENOUS
  Filled 2022-08-18 (×16): qty 100

## 2022-08-18 MED ORDER — DM-GUAIFENESIN ER 30-600 MG PO TB12
1.0000 | ORAL_TABLET | Freq: Two times a day (BID) | ORAL | Status: DC | PRN
  Administered 2022-08-23 – 2022-09-21 (×4): 1 via ORAL
  Filled 2022-08-18 (×7): qty 1

## 2022-08-18 MED ORDER — LISINOPRIL 20 MG PO TABS
40.0000 mg | ORAL_TABLET | Freq: Every day | ORAL | Status: DC
  Administered 2022-08-19 – 2022-08-21 (×3): 40 mg via ORAL
  Filled 2022-08-18 (×3): qty 2

## 2022-08-18 MED ORDER — ACETAMINOPHEN 325 MG PO TABS
650.0000 mg | ORAL_TABLET | Freq: Four times a day (QID) | ORAL | Status: DC | PRN
  Administered 2022-09-09 – 2022-09-13 (×2): 650 mg via ORAL
  Filled 2022-08-18 (×3): qty 2

## 2022-08-18 MED ORDER — VANCOMYCIN HCL IN DEXTROSE 1-5 GM/200ML-% IV SOLN
1000.0000 mg | Freq: Once | INTRAVENOUS | Status: AC
  Administered 2022-08-18: 1000 mg via INTRAVENOUS
  Filled 2022-08-18: qty 200

## 2022-08-18 MED ORDER — AMIODARONE HCL 200 MG PO TABS
200.0000 mg | ORAL_TABLET | Freq: Two times a day (BID) | ORAL | Status: DC
  Administered 2022-08-18 – 2022-08-20 (×4): 200 mg via ORAL
  Filled 2022-08-18 (×4): qty 1

## 2022-08-18 MED ORDER — TRAMADOL HCL 50 MG PO TABS
50.0000 mg | ORAL_TABLET | Freq: Four times a day (QID) | ORAL | Status: DC | PRN
  Administered 2022-08-18 – 2022-09-23 (×28): 50 mg via ORAL
  Filled 2022-08-18 (×28): qty 1

## 2022-08-18 MED ORDER — POTASSIUM CHLORIDE CRYS ER 20 MEQ PO TBCR
20.0000 meq | EXTENDED_RELEASE_TABLET | Freq: Once | ORAL | Status: AC
  Administered 2022-08-18: 20 meq via ORAL
  Filled 2022-08-18: qty 1

## 2022-08-18 MED ORDER — SENNA 8.6 MG PO TABS
1.0000 | ORAL_TABLET | Freq: Every day | ORAL | Status: DC
  Administered 2022-08-19 – 2022-09-02 (×12): 8.6 mg via ORAL
  Filled 2022-08-18 (×18): qty 1

## 2022-08-18 MED ORDER — ASPIRIN 81 MG PO TBEC
81.0000 mg | DELAYED_RELEASE_TABLET | Freq: Every day | ORAL | Status: DC
  Administered 2022-08-18 – 2022-08-26 (×8): 81 mg via ORAL
  Filled 2022-08-18 (×8): qty 1

## 2022-08-18 MED ORDER — ROSUVASTATIN CALCIUM 10 MG PO TABS
10.0000 mg | ORAL_TABLET | Freq: Every day | ORAL | Status: DC
  Filled 2022-08-18: qty 1

## 2022-08-18 NOTE — Sepsis Progress Note (Signed)
Sepsis protocol is being followed by [facility]. 

## 2022-08-18 NOTE — Progress Notes (Signed)
PHARMACY -  BRIEF ANTIBIOTIC NOTE   Pharmacy has received consult(s) for vancomycin and cefepime from an ED provider.  The patient's profile has been reviewed for ht/wt/allergies/indication/available labs.    One time order(s) placed by MD for vancomycin 1 gm and cefepime 2 gm  Further antibiotics/pharmacy consults should be ordered by admitting physician if indicated.                       Thank you, Sahas Sluka A 08/18/2022  11:12 AM

## 2022-08-18 NOTE — H&P (Addendum)
 " History and Physical    Brett Riley FMW:[id number] DOB: [birth year] DOA: 08/18/2022  Referring MD/NP/PA:   PCP: Glendia Shad, MD   Patient coming from:  The patient is coming from rehab     Chief Complaint: SOB  HPI: Brett Riley is a 87 y.o. male with medical history significant of dCHF, HTN, HLD, CAD, CKD-3a, anemia, A fib on Eliquis , who presents with SOB.  Patient was recently hospitalized from 5/18 - 6/7. 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 Pt is s/p of aortic ulcer repair on 5/22. Pt developed sepsis and found to have MSSA bacteremia on 5/31. MRI of the lumbar spine shows significant osteomyelitis with abscess.  Neurosurgery consult obtained, not recommended surgery. TEE was performed on 6/3, no evidence of endocarditis. Pt was discharged on Ancef  every 8 hours (last day is 09/13/2022).   Per his wife at the bedside, patient developed shortness of breath in the past 3 days, which has been progressively worsening.  Patient is normally not using oxygen  at home, but was found to have oxygen  desaturation to 84% on room air, initially started on CPAP, but still has severe respiratory distress, cannot speak in full sentence, using accessory muscle for breathing, BiPAP is started in ED.  Patient does not have chest pain.  Patient has cough with thick pink-colored sputum production.  Denies nausea, vomiting, diarrhea or abdominal pain.  No symptoms of UTI.  Patient had mild fever 100.9 yesterday in facility, which improved to 99 after taking tylenol .  His temperature is normal 97.8 in the ED today.   Data reviewed independently and ED Course: pt was found to have WBC 10.8, BNP 279, troponin level 258, lactic acid 1.8, procalcitonin< 0.10, negative PCR for COVID and flu, stable renal function, temperature normal at 97.8, blood pressure 134/88, heart rate 97, RR 20.  Chest x-ray showed bilateral interstitial opacity, more confluent in the right upper  lobe.  VBG with pH 7.45, CO2 43 and O2 44. Patient is admitted to PCU as inpatient.  EKG: I have personally reviewed.  Sinus rhythm, QTc 474, LAE, T wave inversion in V5-V6   Review of Systems:   General: no fevers, chills, no body weight gain, has fatigue HEENT: no blurry vision, hearing changes or sore throat Respiratory: has dyspnea, coughing, no wheezing CV: no chest pain, no palpitations GI: no nausea, vomiting, abdominal pain, diarrhea, constipation GU: no dysuria, burning on urination, increased urinary frequency, hematuria  Ext: has leg edema Neuro: no unilateral weakness, numbness, or tingling, no vision change or hearing loss Skin: no rash, no skin tear. MSK: No muscle spasm, no deformity, no limitation of range of movement in spin Heme: No easy bruising.  Travel history: No recent long distant travel.   Allergy: No Known Allergies  Past Medical History:  Diagnosis Date   Cancer (HCC)    skin cancer basal   Carotid arterial disease (HCC)    a. 02/2016 L CEA; b. 01/2017 U/S: patent LICA, 1-39% RICA.   Celiac artery stenosis (HCC)    Coronary artery disease    a. 01/2016 MV: mild apical/basal inferior, apical lateral, mid anterolateral, and mid inferolateral ischemia. EF 57%; b. 02/2016 Cath: LM 40ost, LAD 70p/m, 20d, D1 95 (small), LCX nl, RCA 78m, RPDA 90 (small), EF 55-65%-->med Rx. Rec CABG for recurrent symptoms.   Hearing loss    History of echocardiogram    a. 02/2016 Echo: EF 50-55%, no rwma, mild MR.   History of  kidney stones    Hypercholesterolemia    Hypertension    Intraosseous ganglion    3.3 cm left acetabulum   Osteoarthritis, hip, bilateral    Left > Right    Past Surgical History:  Procedure Laterality Date   AORTIC INTERVENTION N/A 07/24/2022   Procedure: AORTIC INTERVENTION;  Surgeon: Jama Cordella MATSU, MD;  Location: [facility];  Service: Cardiovascular;  Laterality: N/A;   CARDIAC CATHETERIZATION N/A 01/19/2016   Procedure: Left  Heart Cath and Coronary Angiography;  Surgeon: Deatrice DELENA Cage, MD;  Location: [facility];  Service: Cardiovascular;  Laterality: N/A;   COLONOSCOPY     CYSTOSCOPY/URETEROSCOPY/HOLMIUM LASER/STENT PLACEMENT Right 04/13/2021   Procedure: CYSTOSCOPY/URETEROSCOPY/HOLMIUM LASER/STENT PLACEMENT;  Surgeon: Francisca Redell BROCKS, MD;  Location: [facility];  Service: Urology;  Laterality: Right;   ENDARTERECTOMY Left 02/15/2016   Procedure: ENDARTERECTOMY CAROTID;  Surgeon: Selinda GORMAN Gu, MD;  Location: [facility] OR[facility];  Service: Vascular;  Laterality: Left;   TEE WITHOUT CARDIOVERSION N/A 08/05/2022   Procedure: TRANSESOPHAGEAL ECHOCARDIOGRAM;  Surgeon: Darliss Redell, MD;  Location: [facility];  Service: Cardiovascular;  Laterality: N/A;    Social History:  reports that he has never smoked. He has never used smokeless tobacco. He reports that he does not drink alcohol and does not use drugs.  Family History:  Family History  Problem Relation Age of Onset   Stroke Mother    Heart disease Father        MI   Heart attack Father    Breast cancer Sister    Colon cancer Neg Hx    Prostate cancer Neg Hx      Prior to Admission medications   Medication Sig Start Date End Date Taking? Authorizing Provider  amiodarone  (PACERONE ) 200 MG tablet Take 1 tablet (200 mg total) by mouth 2 (two) times daily. 08/09/22   Maree Hue, MD  apixaban  (ELIQUIS ) 2.5 MG TABS tablet Take 1 tablet (2.5 mg total) by mouth 2 (two) times daily. 08/09/22   Maree Hue, MD  aspirin  81 MG tablet Take 81 mg by mouth at bedtime.     [provider]  ceFAZolin  (ANCEF ) IVPB Inject 2 g into the vein every 8 (eight) hours. Indication:  Vertebral osteomyelitis  First Dose: Yes Last Day of Therapy:  09/13/22  Labs - Once weekly (Monday):  CBC/D and CMP, Labs - Once weekly (Monday): ESR and CRP Fax weekly lab results  promptly to [phone] Method of administration: IV Push- Please pull PICC at completion of IV antibiotics   Method of administration may be changed at the discretion of home infusion pharmacist based upon assessment of the patient and/or caregiver's ability to self-administer the medication ordered. 08/09/22 09/15/22  Maree Hue, MD  diltiazem  (CARDIZEM  CD) 360 MG 24 hr capsule Take 1 capsule (360 mg total) by mouth daily. 08/09/22   Maree Hue, MD  feeding supplement (ENSURE ENLIVE / ENSURE PLUS) LIQD Take 237 mLs by mouth 2 (two) times daily between meals. 08/09/22   Maree Hue, MD  lisinopril  (ZESTRIL ) 40 MG tablet Take 1 tablet (40 mg total) by mouth daily. 02/14/22   Glendia Shad, MD  metoprolol  succinate (TOPROL -XL) 100 MG 24 hr tablet TAKE 1 TABLET BY MOUTH EVERY DAY WITH A MEAL 11/21/21   Glendia Shad, MD  Multiple Vitamins-Iron  (MULTIVITAMINS WITH IRON ) TABS Take 1 tablet by mouth daily.    [provider]  pantoprazole  (PROTONIX ) 40 MG tablet Take 1 tablet (40 mg total) by mouth daily.  08/09/22   Maree Hue, MD  rosuvastatin  (CRESTOR ) 10 MG tablet Take 1 tablet (10 mg total) by mouth daily. 02/14/22   Glendia Shad, MD  sodium chloride  (OCEAN) 0.65 % SOLN nasal spray Place 1 spray into both nostrils as needed for congestion. 08/09/22   Maree Hue, MD  vitamin B-12 (CYANOCOBALAMIN ) 1000 MCG tablet Take 1,000 mcg by mouth daily.    [provider]    Physical Exam: Vitals:   08/18/22 1330 08/18/22 1400 08/18/22 1541 08/18/22 1730  BP: (!) 145/68 (!) 145/78  135/87  Pulse: 79 82  73  Resp: 15 13  16   Temp:   98 F (36.7 C)   TempSrc:   Axillary   SpO2: 98% 98%  92%   General: Not in acute distress HEENT:       Eyes: PERRL, EOMI, no jaundice       ENT: No discharge from the ears and nose, no pharynx injection, no tonsillar enlargement.        Neck: + JVD, no bruit, no mass felt. Heme: No neck lymph node enlargement. Cardiac: S1/S2, RRR, No murmurs, No gallops or rubs. Respiratory: has crackles bilaterally GI: Soft, nondistended, nontender, no rebound pain, no  organomegaly, BS present. GU: No hematuria Ext: 2+ pitting leg edema bilaterally. 1+DP/PT pulse bilaterally. Musculoskeletal: No joint deformities, No joint redness or warmth, no limitation of ROM in spin. Skin: No rashes.  Neuro: Alert, oriented X3, cranial nerves II-XII grossly intact, moves all extremities normally.  Psych: Patient is not psychotic, no suicidal or hemocidal ideation.  Labs on Admission: I have personally reviewed following labs and imaging studies  CBC: Recent Labs  Lab 08/18/22 1020  WBC 10.8*  NEUTROABS 8.9*  HGB 8.7*  HCT 27.8*  MCV 98.2  PLT 282   Basic Metabolic Panel: Recent Labs  Lab 08/18/22 1020 08/18/22 1046  NA 139  --   K 3.6  --   CL 106  --   CO2 22  --   GLUCOSE 117*  --   BUN 31*  --   CREATININE 1.50*  --   CALCIUM  8.2*  --   MG  --  2.0   GFR: Estimated Creatinine Clearance: 43.9 mL/min (A) (by C-G formula based on SCr of 1.5 mg/dL (H)). Liver Function Tests: No results for input(s): AST, ALT, ALKPHOS, BILITOT, PROT, ALBUMIN  in the last 168 hours. No results for input(s): LIPASE, AMYLASE in the last 168 hours. No results for input(s): AMMONIA in the last 168 hours. Coagulation Profile: No results for input(s): INR, PROTIME in the last 168 hours. Cardiac Enzymes: No results for input(s): CKTOTAL, CKMB, CKMBINDEX, TROPONINI in the last 168 hours. BNP (last 3 results) No results for input(s): PROBNP in the last 8760 hours. HbA1C: No results for input(s): HGBA1C in the last 72 hours. CBG: No results for input(s): GLUCAP in the last 168 hours. Lipid Profile: Recent Labs    08/18/22 1020  CHOL 84  HDL 35*  LDLCALC 34  TRIG 73  CHOLHDL 2.4   Thyroid  Function Tests: No results for input(s): TSH, T4TOTAL, FREET4, T3FREE, THYROIDAB in the last 72 hours. Anemia Panel: No results for input(s): VITAMINB12, FOLATE, FERRITIN, TIBC, IRON , RETICCTPCT in the last 72  hours. Urine analysis:    Component Value Date/Time   COLORURINE AMBER (A) 08/01/2022 1155   APPEARANCEUR CLOUDY (A) 08/01/2022 1155   LABSPEC 1.013 08/01/2022 1155   PHURINE 5.0 08/01/2022 1155   GLUCOSEU NEGATIVE 08/01/2022 1155   HGBUR  LARGE (A) 08/01/2022 1155   BILIRUBINUR NEGATIVE 08/01/2022 1155   KETONESUR NEGATIVE 08/01/2022 1155   PROTEINUR 100 (A) 08/01/2022 1155   NITRITE NEGATIVE 08/01/2022 1155   LEUKOCYTESUR NEGATIVE 08/01/2022 1155   Sepsis Labs: @LABRCNTIP (procalcitonin:4,lacticidven:4) ) Recent Results (from the past 240 hour(s))  Resp Panel by RT-PCR (Flu A&B, Covid) Anterior Nasal Swab     Status: None   Collection Time: 08/18/22 10:46 AM   Specimen: Anterior Nasal Swab  Result Value Ref Range Status   SARS Coronavirus 2 by RT PCR NEGATIVE NEGATIVE Final    Comment: (NOTE) SARS-CoV-2 target nucleic acids are NOT DETECTED.  The SARS-CoV-2 RNA is generally detectable in upper respiratory specimens during the acute phase of infection. The lowest concentration of SARS-CoV-2 viral copies this assay can detect is 138 copies/mL. A negative result does not preclude SARS-Cov-2 infection and should not be used as the sole basis for treatment or other patient management decisions. A negative result may occur with  improper specimen collection/handling, submission of specimen other than nasopharyngeal swab, presence of viral mutation(s) within the areas targeted by this assay, and inadequate number of viral copies(<138 copies/mL). A negative result must be combined with clinical observations, patient history, and epidemiological information. The expected result is Negative.  Fact Sheet for Patients:  bloggercourse.com  Fact Sheet for Healthcare Providers:  seriousbroker.it  This test is no t yet approved or cleared by the [place]  FDA and  has been authorized for detection and/or diagnosis of SARS-CoV-2  by FDA under an Emergency Use Authorization (EUA). This EUA will remain  in effect (meaning this test can be used) for the duration of the COVID-19 declaration under Section 564(b)(1) of the Act, 21 U.S.C.section 360bbb-3(b)(1), unless the authorization is terminated  or revoked sooner.       Influenza A by PCR NEGATIVE NEGATIVE Final   Influenza B by PCR NEGATIVE NEGATIVE Final    Comment: (NOTE) The Xpert Xpress SARS-CoV-2/FLU/RSV plus assay is intended as an aid in the diagnosis of influenza from Nasopharyngeal swab specimens and should not be used as a sole basis for treatment. Nasal washings and aspirates are unacceptable for Xpert Xpress SARS-CoV-2/FLU/RSV testing.  Fact Sheet for Patients: bloggercourse.com  Fact Sheet for Healthcare Providers: seriousbroker.it  This test is not yet approved or cleared by the [place]  FDA and has been authorized for detection and/or diagnosis of SARS-CoV-2 by FDA under an Emergency Use Authorization (EUA). This EUA will remain in effect (meaning this test can be used) for the duration of the COVID-19 declaration under Section 564(b)(1) of the Act, 21 U.S.C. section 360bbb-3(b)(1), unless the authorization is terminated or revoked.  Performed at [facility], [address] [place], [place] [address]      Radiological Exams on Admission: DG Chest [facility] 1 View  Result Date: 08/18/2022 CLINICAL DATA:  Dyspnea EXAM: PORTABLE CHEST 1 VIEW COMPARISON:  08/01/2022 FINDINGS: Interval placement of right-sided PICC line with distal tip terminating at the level of the distal SVC. Stable heart size. Aortic atherosclerosis. Bilateral interstitial opacities, most confluent within the right upper lobe. Possible trace bilateral pleural effusions. No pneumothorax. IMPRESSION: Bilateral interstitial opacities, most confluent within the right upper lobe. Findings may reflect pulmonary edema  versus atypical/viral infection. Electronically Signed   By: Mabel Converse D.O.   On: 08/18/2022 10:38      Assessment/Plan Principal Problem:   Acute respiratory failure with hypoxia (HCC) Active Problems:   Acute on chronic diastolic CHF (congestive heart failure) (HCC)  MSSA bacteremia   Vertebral osteomyelitis (HCC)   CAD (coronary artery disease)   Myocardial injury   Hypertension   Hyperlipidemia   Chronic kidney disease, stage 3a (HCC)   Normocytic anemia   Atrial fibrillation, chronic (HCC)   Assessment and Plan:   Acute on chronic respiratory failure with hypoxia due to acute on chronic diastolic CHF (congestive heart failure): Patient has 2+ leg edema, positive JVD, elevated BNP 279, crackles on auscultation, clinically consistent with CHF exacerbation.  Chest x-ray showed bilateral interstitial opacity, concerning for pneumonia, patient was given 1 dose of vancomycin  and cefepime  in ED, but patient does not have fever, only have very mild leukocytosis with WBC 10.8, lactic acid normal, calcitonin <0.10, clinically does not seem to have pneumonia.  Will hold off of antibiotics for pneumonia. Will treat patient as CHF exacerbation.  2D echo on 07/21/21 showed  EF of 55-60% with grade 1 diastolic dysfunction.  -Will admit to   PCU as inpatient -BiPAP --> will try to wean off BiPAP -Lasix  40 mg bid by IV (pt was given 60 mg of lasix  in noon) -Daily weights -strict I/O's -Low salt diet -Fluid restriction -As needed bronchodilators for shortness of breath  Recent hx of MSSA bacteremia vertebral osteomyelitis (HCC): -continue IV Ancef   -repeat Blood culture  CAD (coronary artery disease) and Myocardial injury: trop  258, possibly due to demand ischemia secondary to CHF exacerbation.  Patient does not have chest pain. -Trend troponin -Check A1c, FLP -Crestor  -Aspirin  81 mg daily -pt is on Eliquis  for A fib. -If trop increases significantly, will need to switch  Eliquis  to IV heparin   Hypertension -IV hydralazine  as needed -Continue Tiazac , lisinopril , metoprolol   Hyperlipidemia -Crestor   Chronic kidney disease, stage 3a (HCC): Renal function stable.  Baseline creatinine 1.5-1.9 recently treated his creatinine is 1.50, BUN 31, GFR 46 -Follow-up with BMI  Normocytic anemia: Hemoglobin 8.7 (9.2 on 08/08/2022), close to baseline. -Follow-up with CBC  Atrial fibrillation, chronic (HCC): Heart rate 97 -Amlodipine , metoprolol , Tiazac  -Eliquis         DVT ppx: on Eliqwuis  Code Status: DNR. I discussed with the patient and his wife.  They said they have a DNR order before.  They said that they want to discuss with their daughter.  If they change their mind, particularly if they want patient to be intubated when his respiratory function deteriorates, they will let us  know.  Currently they are okay to order DNR.  Family Communication:  Yes, patient's wife  at bed side.   Disposition Plan:  Anticipate discharge back to previous environment  Consults called:  none  Admission status and Level of care: Progressive:   as inpt     Dispo: The patient is from: SNF              Anticipated d/c is to: SNF              Anticipated d/c date is: 2 days              Patient currently is not medically stable to d/c.    Severity of Illness:  The appropriate patient status for this patient is INPATIENT. Inpatient status is judged to be reasonable and necessary in order to provide the required intensity of service to ensure the patient's safety. The patient's presenting symptoms, physical exam findings, and initial radiographic and laboratory data in the context of their chronic comorbidities is felt to place them at high risk for further clinical deterioration. Furthermore,  it is not anticipated that the patient will be medically stable for discharge from the hospital within 2 midnights of admission.   * I certify that at the point of admission it is my  clinical judgment that the patient will require inpatient hospital care spanning beyond 2 midnights from the point of admission due to high intensity of service, high risk for further deterioration and high frequency of surveillance required.*       Date of Service 08/18/2022    Caleb Exon [facility]   If 7PM-7AM, please contact night-coverage [email or web address] 08/18/2022, 6:04 PM  "

## 2022-08-18 NOTE — Progress Notes (Signed)
ANTICOAGULATION CONSULT NOTE - Initial Consult  Pharmacy Consult for Heparin Infusion Indication: chest pain/ACS  No Known Allergies  Patient Measurements:   Heparin Dosing Weight:    Vital Signs: Temp: 97.9 F (36.6 C) (06/16 1930) Temp Source: Oral (06/16 1930) BP: 166/81 (06/16 1930) Pulse Rate: 86 (06/16 1930)  Labs: Recent Labs    08/18/22 1020 08/18/22 1805  HGB 8.7*  --   HCT 27.8*  --   PLT 282  --   CREATININE 1.50*  --   TROPONINIHS 258* 1,775*    Estimated Creatinine Clearance: 43.9 mL/min (A) (by C-G formula based on SCr of 1.5 mg/dL (H)).   Medical History: Past Medical History:  Diagnosis Date   Cancer (HCC)    skin cancer basal   Carotid arterial disease (HCC)    a. 02/2016 L CEA; b. 01/2017 U/S: patent LICA, 1-39% RICA.   Celiac artery stenosis (HCC)    Coronary artery disease    a. 01/2016 MV: mild apical/basal inferior, apical lateral, mid anterolateral, and mid inferolateral ischemia. EF 57%; b. 02/2016 Cath: LM 40ost, LAD 70p/m, 20d, D1 95 (small), LCX nl, RCA 70m, RPDA 90 (small), EF 55-65%-->med Rx. Rec CABG for recurrent symptoms.   Hearing loss    History of echocardiogram    a. 02/2016 Echo: EF 50-55%, no rwma, mild MR.   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 Troponins trending up. Pharmacy consulted for Heparin dosing. Patient was taking Apixaban 2.5mg  bid prior to admission for afib, last dose was on 6/15.  Baseline HL: >1.10  Goal of Therapy:  Heparin level 0.3-0.7 units/ml aPTT 66-102 seconds Monitor platelets by anticoagulation protocol: Yes   Plan:  Discontinued order for apixaban Heparin bolus 4000 units Heparin infusion 1150 units/hr Baseline HL is elevated will need to use aPTT to adjust dose until  correlation with HL Will check daily CBC while on Heparin infusion  Clovia Cuff, PharmD, BCPS 08/18/2022 9:19 PM

## 2022-08-18 NOTE — Progress Notes (Signed)
CODE SEPSIS - PHARMACY COMMUNICATION  **Broad Spectrum Antibiotics should be administered within 1 hour of Sepsis diagnosis**  Time Code Sepsis Called/Page Received: 1107  Antibiotics Ordered: cefepime 2 grams x 1 and vancomycin 1,000 mg x 1  Time of 1st antibiotic administration: 1135  Additional action taken by pharmacy: none  If necessary, Name of Provider/Nurse Contacted: n/a    Elliot Gurney, PharmD, BCPS Clinical Pharmacist  08/18/2022 11:32 AM

## 2022-08-18 NOTE — ED Triage Notes (Signed)
Pt here  from nursing home with c/o resp distress sats 84% on 2 liters pt placed on cpap prior to arrival  sats 97 % on arrival

## 2022-08-18 NOTE — ED Provider Notes (Signed)
[facility] Provider Note    Event Date/Time   First MD Initiated Contact with Patient 08/18/22 1018     (approximate)   History   Respiratory Distress EM caveat: Respiratory distress  HPI  Brett Riley is a 87 y.o. male history of coronary artery disease, also notable history that in May he had a penetrating aortic ulcer, it seems to be complicated by bacteremia and discitis with no recommendation for acute surgical intervention.  He has been residing at rehab since  Patient reports he is in no pain except for pain in his mid to lower back which is chronic over the last month.  He reports he has been at rehab due to difficulty walking after his surgery.  He reports he started feeling short of breath today.  He has had fever "off-and-on" evidently for several days.  Currently his medication reconciliation shows that he is on cefazolin, and also taking Eliquis  He denies any chest pain.  His shortness of breath has improved quite a bit after treatment by EMS  EMS reports he was extremely distressed with respirations and hypertension and appeared volume overloaded with crackles throughout his lungs.  They started him on CPAP and placed nitro paste on his chest and he has seen significant improvement.   EMS reports and the patient's wristband and paperwork report that he is DNR.  A DNR form did not accompany him as it was evidently not immediately available at the facility.  I did ask the patient himself though and he does report that he has a DO NOT RESUSCITATE  Nursing facility documentation denotes a fever of 100.7 Fahrenheit yesterday  Physical Exam   Triage Vital Signs: ED Triage Vitals  Enc Vitals Group     BP 08/18/22 1011 134/88     Pulse Rate 08/18/22 1011 97     Resp 08/18/22 1011 20     Temp 08/18/22 1016 97.8 F (36.6 C)     Temp Source 08/18/22 1016 Axillary     SpO2 08/18/22 1011 100 %     Weight --      Height --      Head  Circumference --      Peak Flow --      Pain Score --      Pain Loc --      Pain Edu? --      Excl. in GC? --     Most recent vital signs: Vitals:   08/18/22 1011 08/18/22 1016  BP: 134/88   Pulse: 97   Resp: 20   Temp:  97.8 F (36.6 C)  SpO2: 100%      General: Awake, mild increased work of breathing.  He is able to speak over BiPAP mask once transition from CPAP to BiPAP.  His work of breathing is mildly increased but not severe or tripoding.  He does however note significant improvement from when EMS was called CV:  Good peripheral perfusion.  Normal tones and rate Resp:  Mild tachypnea and accessory muscle use.  Crackles noted through the lower lung fields bilaterally. Abd:  No distention.  Soft nontender nondistended Other:  Moderate bilateral lower extremity pitting edema also some edema noted in fingers of both hands   ED Results / Procedures / Treatments   Labs (all labs ordered are listed, but only abnormal results are displayed) Labs Reviewed  CBC WITH DIFFERENTIAL/PLATELET - Abnormal; Notable for the following components:      Result  Value   WBC 10.8 (*)    RBC 2.83 (*)    Hemoglobin 8.7 (*)    HCT 27.8 (*)    Neutro Abs 8.9 (*)    Abs Immature Granulocytes 0.09 (*)    All other components within normal limits  BASIC METABOLIC PANEL - Abnormal; Notable for the following components:   Glucose, Bld 117 (*)    BUN 31 (*)    Creatinine, Ser 1.50 (*)    Calcium 8.2 (*)    GFR, Estimated 46 (*)    All other components within normal limits  BRAIN NATRIURETIC PEPTIDE - Abnormal; Notable for the following components:   B Natriuretic Peptide 279.2 (*)    All other components within normal limits  TROPONIN I (HIGH SENSITIVITY) - Abnormal; Notable for the following components:   Troponin I (High Sensitivity) 258 (*)    All other components within normal limits  RESP PANEL BY RT-PCR (FLU A&B, COVID) ARPGX2  CULTURE, BLOOD (ROUTINE X 2)  CULTURE, BLOOD (ROUTINE X  2)  EXPECTORATED SPUTUM ASSESSMENT W GRAM STAIN, RFLX TO RESP C  PROCALCITONIN  LACTIC ACID, PLASMA  HEMOGLOBIN A1C  LIPID PANEL  MAGNESIUM     EKG  Interpreted by me at 1016 heart rate 95 QRS 90 QTc 480 Normal sinus rhythm, no evidence of acute ischemia.  There is a mild but somewhat nonspecific T wave abnormality noted in V4 including slight depression versus artifact.   RADIOLOGY  Chest x-ray personally interpreted by me is concerning for right upper lobe infiltrate also findings suggestive of possible vascular overload  DG Chest Port 1 View  Result Date: 08/18/2022 CLINICAL DATA:  Dyspnea EXAM: PORTABLE CHEST 1 VIEW COMPARISON:  08/01/2022 FINDINGS: Interval placement of right-sided PICC line with distal tip terminating at the level of the distal SVC. Stable heart size. Aortic atherosclerosis. Bilateral interstitial opacities, most confluent within the right upper lobe. Possible trace bilateral pleural effusions. No pneumothorax. IMPRESSION: Bilateral interstitial opacities, most confluent within the right upper lobe. Findings may reflect pulmonary edema versus atypical/viral infection. Electronically Signed   By: Duanne Guess D.O.   On: 08/18/2022 10:38    PROCEDURES:  Critical Care performed: Yes, see critical care procedure note(s)  CRITICAL CARE Performed by: Sharyn Creamer   Total critical care time: 35 minutes  Critical care time was exclusive of separately billable procedures and treating other patients.  Critical care was necessary to treat or prevent imminent or life-threatening deterioration.  Critical care was time spent personally by me on the following activities: development of treatment plan with patient and/or surrogate as well as nursing, discussions with consultants, evaluation of patient's response to treatment, examination of patient, obtaining history from patient or surrogate, ordering and performing treatments and interventions, ordering and review of  laboratory studies, ordering and review of radiographic studies, pulse oximetry and re-evaluation of patient's condition.  Procedures   MEDICATIONS ORDERED IN ED: Medications  vancomycin (VANCOCIN) IVPB 1000 mg/200 mL premix (1,000 mg Intravenous New Bag/Given 08/18/22 1213)  albuterol (PROVENTIL) (2.5 MG/3ML) 0.083% nebulizer solution 2.5 mg (has no administration in time range)  dextromethorphan-guaiFENesin (MUCINEX DM) 30-600 MG per 12 hr tablet 1 tablet (has no administration in time range)  ondansetron (ZOFRAN) injection 4 mg (has no administration in time range)  hydrALAZINE (APRESOLINE) injection 5 mg (has no administration in time range)  acetaminophen (TYLENOL) tablet 650 mg (has no administration in time range)  ceFEPIme (MAXIPIME) 2 g in sodium chloride 0.9 % 100 mL IVPB (0  g Intravenous Stopped 08/18/22 1211)  furosemide (LASIX) injection 60 mg (60 mg Intravenous Given 08/18/22 1215)     IMPRESSION / MDM / ASSESSMENT AND PLAN / ED COURSE  I reviewed the triage vital signs and the nursing notes.                              Differential diagnosis includes, but is not limited to, volume overload, CHF, pneumonia, pneumothorax, ACS, PE, etc.  No obvious acute vascular symptoms.  His exam is concerning for hypovolemic appearance with mild JVD also appears Damas in his hands feet bilaterally  The patient is actively on anticoagulation.  This along with his presentation makes me think that pulmonary embolism would be quite unlikely.  He appears primarily volume overloaded and does not have any associated chest pain his EKG does not show frank ischemia but we will certainly check troponins.  High concern for CHF volume overload pneumonia etc.  Patient's presentation is most consistent with acute presentation with potential threat to life or bodily function.   The patient is on the cardiac monitor to evaluate for evidence of arrhythmia and/or significant heart rate changes.  Labs are  notable for mild leukocytosis.  Element of chronic renal disease  He does meet sepsis criteria with heart rate greater than 90 as well as increased respiratory rate and suspicion for infection including possible pneumonia and also has findings suggestive of possible associated vascular overload.  He does also have known discitis and is currently on IV antibiotic therapy  Will place on broad-spectrum antibiotic.  Discussed case and care and history with our hospitalist, after consultation patient will be excepted to the hospitalist by Dr. Clyde Lundborg  I did clarify with the patient his wife he is DO NOT RESUSCITATE, but is okay for intubation  Troponin is elevated without associated chest pain or obvious ischemia on ECG, suggestive of likely demand ischemia in the setting of respiratory distress now improving.  BNP elevated suggestive of possible volume overload, last echo showed normal ejection fraction.  Procalcitonin is low which is somewhat argumentative against acute pulmonary bacterial infection, but given infiltrative finding fever yesterday I think coverage with antibiotics as we further elucidate cause is warranted  Patient and his wife at the bedside understandable agreeable with admission.  He is currently awake and alert tolerating BiPAP well     FINAL CLINICAL IMPRESSION(S) / ED DIAGNOSES   Final diagnoses:  HCAP (healthcare-associated pneumonia)  Sepsis, due to unspecified organism, unspecified whether acute organ dysfunction present [facility])     Rx / DC Orders   ED Discharge Orders     None        Note:  This document was prepared using Dragon voice recognition software and may include unintentional dictation errors.   Sharyn Creamer, MD 08/18/22 1220

## 2022-08-18 NOTE — ED Notes (Signed)
Hospitalist called back in regards to Troponin 1775

## 2022-08-19 ENCOUNTER — Inpatient Hospital Stay (HOSPITAL_COMMUNITY)
Admit: 2022-08-19 | Discharge: 2022-08-19 | Disposition: A | Discharge status: Home / Self Care | Payer: Medicare HMO | Attending: Cardiology | Admitting: Cardiology

## 2022-08-19 DIAGNOSIS — I214 Non-ST elevation (NSTEMI) myocardial infarction: Secondary | ICD-10-CM | POA: Diagnosis not present

## 2022-08-19 DIAGNOSIS — J9601 Acute respiratory failure with hypoxia: Secondary | ICD-10-CM

## 2022-08-19 DIAGNOSIS — R0609 Other forms of dyspnea: Secondary | ICD-10-CM

## 2022-08-19 DIAGNOSIS — R7881 Bacteremia: Secondary | ICD-10-CM | POA: Diagnosis not present

## 2022-08-19 DIAGNOSIS — I5033 Acute on chronic diastolic (congestive) heart failure: Secondary | ICD-10-CM | POA: Diagnosis not present

## 2022-08-19 LAB — ECHOCARDIOGRAM LIMITED
Calc EF: 49.2 %
Height: 72 in
S' Lateral: 3.5 cm
Single Plane A2C EF: 37.3 %
Single Plane A4C EF: 54.1 %
Weight: 3243.2 [oz_av]

## 2022-08-19 LAB — APTT
aPTT: 146 s — ABNORMAL HIGH (ref 24–36)
aPTT: 81 s — ABNORMAL HIGH (ref 24–36)

## 2022-08-19 LAB — CBC
HCT: 24.1 % — ABNORMAL LOW (ref 39.0–52.0)
Hemoglobin: 7.6 g/dL — ABNORMAL LOW (ref 13.0–17.0)
MCH: 30.3 pg (ref 26.0–34.0)
MCHC: 31.5 g/dL (ref 30.0–36.0)
MCV: 96 fL (ref 80.0–100.0)
Platelets: 245 10*3/uL (ref 150–400)
RBC: 2.51 MIL/uL — ABNORMAL LOW (ref 4.22–5.81)
RDW: 13.8 % (ref 11.5–15.5)
WBC: 9.3 10*3/uL (ref 4.0–10.5)
nRBC: 0 % (ref 0.0–0.2)

## 2022-08-19 LAB — BASIC METABOLIC PANEL WITH GFR
Anion gap: 9 (ref 5–15)
BUN: 31 mg/dL — ABNORMAL HIGH (ref 8–23)
CO2: 25 mmol/L (ref 22–32)
Calcium: 7.9 mg/dL — ABNORMAL LOW (ref 8.9–10.3)
Chloride: 106 mmol/L (ref 98–111)
Creatinine, Ser: 1.56 mg/dL — ABNORMAL HIGH (ref 0.61–1.24)
GFR, Estimated: 44 mL/min — ABNORMAL LOW
Glucose, Bld: 100 mg/dL — ABNORMAL HIGH (ref 70–99)
Potassium: 3.4 mmol/L — ABNORMAL LOW (ref 3.5–5.1)
Sodium: 140 mmol/L (ref 135–145)

## 2022-08-19 LAB — HEPARIN LEVEL (UNFRACTIONATED): Heparin Unfractionated: >1.1 [IU]/mL — ABNORMAL HIGH (ref 0.30–0.70)

## 2022-08-19 LAB — TROPONIN I (HIGH SENSITIVITY)
Troponin I (High Sensitivity): 1726 ng/L
Troponin I (High Sensitivity): 1446 ng/L

## 2022-08-19 MED ORDER — TAB-A-VITE/IRON PO TABS
1.0000 | ORAL_TABLET | Freq: Every day | ORAL | Status: DC
  Administered 2022-08-20 – 2022-09-24 (×36): 1 via ORAL
  Filled 2022-08-19 (×37): qty 1

## 2022-08-19 MED ORDER — POTASSIUM CHLORIDE CRYS ER 20 MEQ PO TBCR
20.0000 meq | EXTENDED_RELEASE_TABLET | Freq: Once | ORAL | Status: AC
  Administered 2022-08-19: 20 meq via ORAL
  Filled 2022-08-19: qty 1

## 2022-08-19 MED ORDER — FUROSEMIDE 10 MG/ML IJ SOLN
60.0000 mg | Freq: Two times a day (BID) | INTRAMUSCULAR | Status: DC
  Administered 2022-08-19 – 2022-08-21 (×4): 60 mg via INTRAVENOUS
  Filled 2022-08-19 (×4): qty 6

## 2022-08-19 MED ORDER — HEPARIN (PORCINE) 25000 UT/250ML-% IV SOLN: 850.0000 [IU]/h | INTRAVENOUS | Status: DC

## 2022-08-19 MED ORDER — HEPARIN (PORCINE) 25000 UT/250ML-% IV SOLN
850.0000 [IU]/h | INTRAVENOUS | Status: DC
  Administered 2022-08-19 (×2): 850 [IU]/h via INTRAVENOUS
  Filled 2022-08-19: qty 250

## 2022-08-19 MED ORDER — FUROSEMIDE 10 MG/ML IJ SOLN
60.0000 mg | Freq: Once | INTRAMUSCULAR | Status: AC
  Administered 2022-08-19: 60 mg via INTRAVENOUS
  Filled 2022-08-19: qty 6

## 2022-08-19 NOTE — Progress Notes (Signed)
PROGRESS NOTE    Brett Riley   [name] DOB: 10-08-1935  DOA: 08/18/2022 Date of Service: 08/19/22 PCP: Dale Ina, MD     Brief Narrative / Hospital Course:  Brett Riley is a 87 y.o. male with medical history significant of dCHF, HTN, HLD, CAD, CKD-3a, anemia, A fib on Eliquis, who presents 08/18/2022 to ED from rehab, chief complaint SOB.  Of note, recent admission 07/20/22-08/09/22, long stay w/ back pain, aortic ulcer w/ repair 05/22, stay complicated by sepsis w/ MSSA bacteremia and L-spine osteomyelitis/abscess no surgery, d/c on IV abx  06/16: acute resp fail, BiPap in ED, admitted for acute on chronic HFpEF and heparin gtt for NSTEMI, cardiology to follow  06/17: cardiology recs - increase diuresis  Consultants:  Cardiology   Procedures: none      ASSESSMENT & PLAN:   Principal Problem:   Acute respiratory failure with hypoxia (HCC) Active Problems:   Acute on chronic diastolic CHF (congestive heart failure) (HCC)   MSSA bacteremia   Vertebral osteomyelitis (HCC)   CAD (coronary artery disease)   Myocardial injury   Hypertension   Hyperlipidemia   Chronic kidney disease, stage 3a (HCC)   Normocytic anemia   Atrial fibrillation, chronic (HCC)  Acute on chronic respiratory failure with hypoxia due to acute on chronic diastolic CHF (congestive heart failure):  2D echo on 07/21/21 showed  EF of 55-60% with grade 1 diastolic dysfunction Presents w/ edema, JVD, rales CXR questionable for PNA but does not seem clinically to have PNA and procalcitonon <0.10 so abx were held  BiPAP prn Lasix increased to 60 mg bid Repeat limited echo per cardiology , eval new systolic dysfunction Daily weights, strict I/O's, Low salt diet, Fluid restriction   Atrial fibrillation, chronic (HCC): Heart rate 97 Amiodarone, ASA, diltiazem, lisinopril, metoprolol Hold Eliquis, ok to continue heparin and monitor HH    Recent hx of MSSA bacteremia vertebral  osteomyelitis (HCC): continue IV Ancef  repeat Blood culture   CAD (coronary artery disease) and Myocardial injury:  trop  258, possibly due to demand ischemia secondary to CHF exacerbation.  Patient does not have chest pain. Trend troponin --> 1775 and heparin gtt started --> 1446 Heparin gtt  Check A1c, FLP Crestor, beta blocker, Aspirin 81 mg daily  Cardiology following  Will need coronary angiography at some point but will defer for now     Hypertension IV hydralazine as needed Other cardiac meds as above    Hyperlipidemia Crestor   GERD PPI  Chronic kidney disease, stage 3a (HCC):  Renal function stable.  Baseline creatinine 1.5-1.9 recently treated his creatinine is 1.50, BUN 31, GFR 46 Follow-up with BMP   Normocytic anemia:  Hemoglobin 8.7 (9.2 on 08/08/2022), close to baseline. Follow CBC     DVT prophylaxis: on heparin gtt  Pertinent IV fluids/nutrition: no continuous IV fluids  Central lines / invasive devices: PICC in R Brachial    Code Status: DNR, see H&P, discuss w/ pt/family if respiratory deterioration requiring intubation, no CPR ACP documentation reviewed: 08/19/22 - HCPOA w/ 1) Dava Najjar, alternates 2) Erling Conte, 3) Sandra Cockayne FIelds.   Current Admission Status: inpatient  TOC needs / Dispo plan: TBD Barriers to discharge / significant pending items: cardiology recs, CHF tx, heparin gtt              Subjective / Brief ROS:  Patient reports fatigue but no chest pain or SOB Pain controlled.  Denies new weakness.  Tolerating diet Reports no  concerns w/ urination/defecation.   Family Communication: wife at bedside on rounds     Objective Findings:  Vitals:   08/18/22 2115 08/19/22 0414 08/19/22 0757 08/19/22 1153  BP: 134/72 108/84 (!) 159/76 124/62  Pulse: 91 83 86 81  Resp: (!) 24 (!) 21 16 20   Temp: 98.3 F (36.8 C) 97.8 F (36.6 C) 98.4 F (36.9 C) 97.8 F (36.6 C)  TempSrc: Oral  Oral Oral  SpO2: 95% 96% (!)  85% 91%  Weight: 91.9 kg     Height: 6' (1.829 m)       Intake/Output Summary (Last 24 hours) at 08/19/2022 1452 Last data filed at 08/19/2022 1153 Gross per 24 hour  Intake 360 ml  Output 3300 ml  Net -2940 ml   Filed Weights   08/18/22 2115  Weight: 91.9 kg    Examination:  Physical Exam Constitutional:      General: He is not in acute distress. Cardiovascular:     Rate and Rhythm: Normal rate and regular rhythm.     Heart sounds: Normal heart sounds.  Pulmonary:     Effort: Pulmonary effort is normal.     Breath sounds: Rales present.  Abdominal:     Palpations: Abdomen is soft.  Neurological:     General: No focal deficit present.     Mental Status: He is alert. Mental status is at baseline.  Psychiatric:        Mood and Affect: Mood normal.        Behavior: Behavior normal.          Scheduled Medications:   amiodarone  200 mg Oral BID   aspirin EC  81 mg Oral QHS   Chlorhexidine Gluconate Cloth  6 each Topical Daily   cyanocobalamin  1,000 mcg Oral Daily   diltiazem  420 mg Oral Daily   feeding supplement  237 mL Oral BID BM   furosemide  60 mg Intravenous Q12H   lisinopril  40 mg Oral Daily   metoprolol succinate  100 mg Oral Daily   [START ON 08/20/2022] multivitamins with iron  1 tablet Oral Daily   pantoprazole  40 mg Oral Daily   polyethylene glycol  17 g Oral Daily   rosuvastatin  40 mg Oral Daily   senna  1 tablet Oral Daily    Continuous Infusions:  ceFAZolin 2 g (08/19/22 1400)   heparin 850 Units/hr (08/19/22 0723)    PRN Medications:  acetaminophen, albuterol, dextromethorphan-guaiFENesin, hydrALAZINE, ondansetron (ZOFRAN) IV, traMADol  Antimicrobials from admission:  Anti-infectives (From admission, onward)    Start     Dose/Rate Route Frequency Ordered Stop   08/18/22 1400  ceFAZolin (ANCEF) IVPB 2g/100 mL premix       Note to Pharmacy: Indication:  Vertebral osteomyelitis  First Dose: Yes Last Day of Therapy:  09/13/22  Labs -  Once weekly (Monday):  CBC/D and CMP, Labs - Once weekly (Monday): ESR and CRP Fax weekly lab results  promptly to [phone] Method of administration: IV Push- Please pull PICC at completion of IV   2 g 200 mL/hr over 30 Minutes Intravenous Every 8 hours 08/18/22 1319 09/13/22 2359   08/18/22 1115  vancomycin (VANCOCIN) IVPB 1000 mg/200 mL premix        1,000 mg 200 mL/hr over 60 Minutes Intravenous  Once 08/18/22 1107 08/18/22 1322   08/18/22 1115  ceFEPIme (MAXIPIME) 2 g in sodium chloride 0.9 % 100 mL IVPB  2 g 200 mL/hr over 30 Minutes Intravenous  Once 08/18/22 1107 08/18/22 1211           Data Reviewed:  I have personally reviewed the following...  CBC: Recent Labs  Lab 08/18/22 1020 08/19/22 0505  WBC 10.8* 9.3  NEUTROABS 8.9*  --   HGB 8.7* 7.6*  HCT 27.8* 24.1*  MCV 98.2 96.0  PLT 282 245   Basic Metabolic Panel: Recent Labs  Lab 08/18/22 1020 08/18/22 1046 08/19/22 0505  NA 139  --  140  K 3.6  --  3.4*  CL 106  --  106  CO2 22  --  25  GLUCOSE 117*  --  100*  BUN 31*  --  31*  CREATININE 1.50*  --  1.56*  CALCIUM 8.2*  --  7.9*  MG  --  2.0  --    GFR: Estimated Creatinine Clearance: 38.7 mL/min (A) (by C-G formula based on SCr of 1.56 mg/dL (H)). Liver Function Tests: No results for input(s): "AST", "ALT", "ALKPHOS", "BILITOT", "PROT", "ALBUMIN" in the last 168 hours. No results for input(s): "LIPASE", "AMYLASE" in the last 168 hours. No results for input(s): "AMMONIA" in the last 168 hours. Coagulation Profile: Recent Labs  Lab 08/18/22 2033  INR 1.9*   Cardiac Enzymes: No results for input(s): "CKTOTAL", "CKMB", "CKMBINDEX", "TROPONINI" in the last 168 hours. BNP (last 3 results) No results for input(s): "PROBNP" in the last 8760 hours. HbA1C: Recent Labs    08/18/22 2040  HGBA1C 5.4   CBG: No results for input(s): "GLUCAP" in the last 168 hours. Lipid Profile: Recent Labs    08/18/22 1020  CHOL 84  HDL 35*   LDLCALC 34  TRIG 73  CHOLHDL 2.4   Thyroid Function Tests: No results for input(s): "TSH", "T4TOTAL", "FREET4", "T3FREE", "THYROIDAB" in the last 72 hours. Anemia Panel: No results for input(s): "VITAMINB12", "FOLATE", "FERRITIN", "TIBC", "IRON", "RETICCTPCT" in the last 72 hours. Most Recent Urinalysis On File:     Component Value Date/Time   COLORURINE AMBER (A) 08/01/2022 1155   APPEARANCEUR CLOUDY (A) 08/01/2022 1155   LABSPEC 1.013 08/01/2022 1155   PHURINE 5.0 08/01/2022 1155   GLUCOSEU NEGATIVE 08/01/2022 1155   HGBUR LARGE (A) 08/01/2022 1155   BILIRUBINUR NEGATIVE 08/01/2022 1155   KETONESUR NEGATIVE 08/01/2022 1155   PROTEINUR 100 (A) 08/01/2022 1155   NITRITE NEGATIVE 08/01/2022 1155   LEUKOCYTESUR NEGATIVE 08/01/2022 1155   Sepsis Labs: @LABRCNTIP (procalcitonin:4,lacticidven:4) Microbiology: Recent Results (from the past 240 hour(s))  Blood culture (routine x 2)     Status: None (Preliminary result)   Collection Time: 08/18/22 10:45 AM   Specimen: BLOOD LEFT ARM  Result Value Ref Range Status   Specimen Description BLOOD LEFT ARM  Final   Special Requests   Final    BOTTLES DRAWN AEROBIC AND ANAEROBIC Blood Culture adequate volume   Culture   Final    NO GROWTH < 24 HOURS Performed at [facility], [address], [place], [place] [address]    Report Status PENDING  Incomplete  Resp Panel by RT-PCR (Flu A&B, Covid) Anterior Nasal Swab     Status: None   Collection Time: 08/18/22 10:46 AM   Specimen: Anterior Nasal Swab  Result Value Ref Range Status   SARS Coronavirus 2 by RT PCR NEGATIVE NEGATIVE Final    Comment: (NOTE) SARS-CoV-2 target nucleic acids are NOT DETECTED.  The SARS-CoV-2 RNA is generally detectable in upper respiratory specimens during the acute phase of infection.  The lowest concentration of SARS-CoV-2 viral copies this assay can detect is 138 copies/mL. A negative result does not preclude SARS-Cov-2 infection and should not  be used as the sole basis for treatment or other patient management decisions. A negative result may occur with  improper specimen collection/handling, submission of specimen other than nasopharyngeal swab, presence of viral mutation(s) within the areas targeted by this assay, and inadequate number of viral copies(<138 copies/mL). A negative result must be combined with clinical observations, patient history, and epidemiological information. The expected result is Negative.  Fact Sheet for Patients:  BloggerCourse.com  Fact Sheet for Healthcare Providers:  SeriousBroker.it  This test is no t yet approved or cleared by the [place] FDA and  has been authorized for detection and/or diagnosis of SARS-CoV-2 by FDA under an Emergency Use Authorization (EUA). This EUA will remain  in effect (meaning this test can be used) for the duration of the COVID-19 declaration under Section 564(b)(1) of the Act, 21 U.S.C.section 360bbb-3(b)(1), unless the authorization is terminated  or revoked sooner.       Influenza A by PCR NEGATIVE NEGATIVE Final   Influenza B by PCR NEGATIVE NEGATIVE Final    Comment: (NOTE) The Xpert Xpress SARS-CoV-2/FLU/RSV plus assay is intended as an aid in the diagnosis of influenza from Nasopharyngeal swab specimens and should not be used as a sole basis for treatment. Nasal washings and aspirates are unacceptable for Xpert Xpress SARS-CoV-2/FLU/RSV testing.  Fact Sheet for Patients: BloggerCourse.com  Fact Sheet for Healthcare Providers: SeriousBroker.it  This test is not yet approved or cleared by the [place] FDA and has been authorized for detection and/or diagnosis of SARS-CoV-2 by FDA under an Emergency Use Authorization (EUA). This EUA will remain in effect (meaning this test can be used) for the duration of the COVID-19 declaration under Section  564(b)(1) of the Act, 21 U.S.C. section 360bbb-3(b)(1), unless the authorization is terminated or revoked.  Performed at [facility], [address] [place], [place] [address]   Culture, blood (Routine X 2) w Reflex to ID Panel     Status: None (Preliminary result)   Collection Time: 08/18/22  9:19 PM   Specimen: BLOOD LEFT ARM  Result Value Ref Range Status   Specimen Description BLOOD LEFT ARM  Final   Special Requests   Final    BOTTLES DRAWN AEROBIC AND ANAEROBIC Blood Culture adequate volume   Culture   Final    NO GROWTH < 12 HOURS Performed at [facility], [address], [place], [place] [address]    Report Status PENDING  Incomplete      Radiology Studies last 3 days: DG Chest Port 1 View  Result Date: 08/18/2022 CLINICAL DATA:  Dyspnea EXAM: PORTABLE CHEST 1 VIEW COMPARISON:  08/01/2022 FINDINGS: Interval placement of right-sided PICC line with distal tip terminating at the level of the distal SVC. Stable heart size. Aortic atherosclerosis. Bilateral interstitial opacities, most confluent within the right upper lobe. Possible trace bilateral pleural effusions. No pneumothorax. IMPRESSION: Bilateral interstitial opacities, most confluent within the right upper lobe. Findings may reflect pulmonary edema versus atypical/viral infection. Electronically Signed   By: Duanne Guess D.O.   On: 08/18/2022 10:38             LOS: 1 day      Sunnie Nielsen, DO [facility] 08/19/2022, 2:52 PM    Dictation software may have been used to generate the above note. Typos may occur and escape review in typed/dictated  notes. Please contact Dr Lyn Hollingshead directly for clarity if needed.  Staff may message me via secure chat in Epic  but this may not receive an immediate response,  please page me for urgent matters!  If 7PM-7AM, please contact night coverage [email or web address]

## 2022-08-19 NOTE — Hospital Course (Addendum)
 Brett Riley is a 87 y.o. male with medical history significant of dCHF, HTN, HLD, CAD, CKD-3a, anemia, A fib on Eliquis , who presents 08/18/2022 to ED from rehab, chief complaint SOB.  Of note, recent admission 07/20/22-08/09/22, long stay w/ back pain, aortic ulcer w/ repair 05/22, stay complicated by sepsis w/ MSSA bacteremia and L-spine osteomyelitis/abscess no surgery, d/c on IV abx  06/16: acute resp fail, BiPap in ED, admitted for acute on chronic HFpEF and heparin  gtt for NSTEMI, cardiology to follow  06/17: cardiology recs - increase diuresis 06/18: SOB and needing HFNC O2, Hgb 7.7 despite diuresis, 1 unit PRBC administered. Net IO Since Admission: -5,549.14 mL [08/20/22 1608]. Heparin  stopped d/t rectal bleeding overnight.  06/19: euvolemic per cardio, d/c lasix , O2 requirement still high, CT chest reviewed, ?edema/atypical infection, given clinically worsening respiratory status have asked pulmonary and ID to consult. Have d/c amiodarone  and ordered steroids in case amio toxicity. Hypoxic, somnolent, so placed back on BiPap. Added lasix  tonight and started azithromycin . Long discussion w/ family - PT AND FAMILY AGREE FOR DO NOT INTUBATE.   Consultants:  Cardiology Pulmonology    Procedures: none      ASSESSMENT & PLAN:   Principal Problem:   Acute respiratory failure with hypoxia (HCC) Active Problems:   Acute on chronic diastolic CHF (congestive heart failure) (HCC)   MSSA bacteremia   Vertebral osteomyelitis (HCC)   CAD (coronary artery disease)   Myocardial injury   Hypertension   Hyperlipidemia   Chronic kidney disease, stage 3a (HCC)   Normocytic anemia   Atrial fibrillation, chronic (HCC)  Acute on chronic respiratory failure with hypoxia due to acute on chronic diastolic CHF (congestive heart failure), complicated by potential pneumonia, pleural effusion  See below for individual A/P BiPap  CHF 2D echo on 07/21/21 showed  EF of 55-60% with grade 1 diastolic  dysfunction Limited echocardiogram revealed LVEF of 50-55% with RV systolic function is normal  CXR questionable for PNA but did not seem clinically to have PNA and procalcitonon <0.10 so abx for PNA were held early in admission and treatment focused on CHF Lasix  per cardiology  Repeat limited echo to eval new systolic dysfunction --> no significant worsening form previous echo  Daily weights, strict I/O's, Low salt diet, Fluid restriction Per cardiology fluid status seem improved but respiratory status is not better and is worsening, will get CT chest - see below Ordered additional lasix  dose this evening   Abnormal CT chest Concerning for pulmonary edema vs atypical infection Will get procalcitonin and BNP to help differentiate CHF vs infection but will continue treating w/ diuresis and will add abx pending pulmonary and ID eval (secure chatted w/ Dr Parris and Dr Fayette 08/21/22 who will see patient tomorrow) Bipap  Repeat Lasix  as above restarted abx 08/21/22 w/ borderline elevated procalcitonin and concern for atypical infectino on CT Stopped amiodarone  in case of lung toxicity, gave prednisone  Repeat BCx  Pleural effusion Thoracentesis   Ascites on exam Paracentesis    Atrial fibrillation, chronic (HCC): Heart rate 97 ASA, diltiazem , lisinopril , metoprolol  per cardiology Hold Eliquis  given anemia  Stopped amiodarone  in case of lung toxicity, gave prednisone   Recent hx of MSSA bacteremia vertebral osteomyelitis (HCC): continue IV Ancef   repeat Blood culture NGx2d   CAD (coronary artery disease) and Myocardial injury:  trop  258, possibly due to demand ischemia secondary to CHF exacerbation.  Patient does not have chest pain. Trend troponin --> 1775 and heparin  gtt started --> 1446 Heparin   gtt held d/t anemia  Crestor , beta blocker, Aspirin  81 mg daily  Cardiology following  May need coronary angiography at some point but will defer for now     Hypertension IV  hydralazine  as needed Other cardiac meds as above    Hyperlipidemia Crestor    Hypokalemia Replace as needed Monitor BMP  GERD PPI  Chronic kidney disease, stage 3a (HCC):  Renal function stable.  Baseline creatinine 1.5-1.9 Follow BMP Avoiding fluids d/t CHF   Normocytic anemia:  ABLA w/ rectal bleed S/p 1 unit PRBC 06/18 Follow CBC  Leukocytosis  Potentially reactive vs infectious  Follow CBC, adding differential  Obtain procal, repeat Bcx     DVT prophylaxis: on heparin  gtt  Pertinent IV fluids/nutrition: no continuous IV fluids  Central lines / invasive devices: PICC in R Brachial    Code Status: DNR, see H&P, discuss w/ pt/family if respiratory deterioration requiring intubation, no CPR ACP documentation reviewed: 08/19/22 - HCPOA w/ 1) Jackolyn Mings, alternates 2) Verneita Feeling, 3) Kaitlynn Lamm FIelds.   Current Admission Status: inpatient  TOC needs / Dispo plan: TBD Barriers to discharge / significant pending items: cardiology recs, CHF tx diuresing, needing blood transfusion today

## 2022-08-19 NOTE — Progress Notes (Signed)
*  PRELIMINARY RESULTS* Echocardiogram A Limited 2D Echocardiogram has been performed.  Brett Riley 08/19/2022, 2:34 PM

## 2022-08-19 NOTE — Progress Notes (Signed)
Patient had moderate bowel movement with moderate bright red blood present. Provider notified via secure chat. Heparin drip has been stopped and we will hold and monitor for further signs of bleeding.

## 2022-08-19 NOTE — Progress Notes (Signed)
ANTICOAGULATION CONSULT NOTE - Initial Consult  Pharmacy Consult for Heparin Infusion Indication: chest pain/ACS  No Known Allergies  Patient Measurements: Height: 6' (182.9 cm) Weight: 91.9 kg (202 lb 11.2 oz) IBW/kg (Calculated) : 77.6 Heparin Dosing Weight:    Vital Signs: Temp: 97.8 F (36.6 C) (06/17 0414) Temp Source: Oral (06/16 2115) BP: 108/84 (06/17 0414) Pulse Rate: 83 (06/17 0414)  Labs: Recent Labs    08/18/22 1020 08/18/22 1805 08/18/22 2033 08/18/22 2303 08/19/22 0505  HGB 8.7*  --   --   --  7.6*  HCT 27.8*  --   --   --  24.1*  PLT 282  --   --   --  245  APTT  --   --  43*  --  146*  LABPROT  --   --  21.6*  --   --   INR  --   --  1.9*  --   --   HEPARINUNFRC  --   --  >1.10*  --  >1.10*  CREATININE 1.50*  --   --   --   --   TROPONINIHS 258* 1,775* 1,726* 1,446*  --      Estimated Creatinine Clearance: 40.2 mL/min (A) (by C-G formula based on SCr of 1.5 mg/dL (H)).   Medical History: Past Medical History:  Diagnosis Date   Cancer (HCC)    skin cancer basal   Carotid arterial disease (HCC)    a. 02/2016 L CEA; b. 01/2017 U/S: patent LICA, 1-39% RICA.   Celiac artery stenosis (HCC)    Coronary artery disease    a. 01/2016 MV: mild apical/basal inferior, apical lateral, mid anterolateral, and mid inferolateral ischemia. EF 57%; b. 02/2016 Cath: LM 40ost, LAD 70p/m, 20d, D1 95 (small), LCX nl, RCA 67m, RPDA 90 (small), EF 55-65%-->med Rx. Rec CABG for recurrent symptoms.   Hearing loss    History of echocardiogram    a. 02/2016 Echo: EF 50-55%, no rwma, mild MR.   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 Troponins trending up. Pharmacy consulted for Heparin dosing. Patient was taking Apixaban 2.5mg  bid prior to admission for  afib, last dose was on 6/15.  Baseline HL: >1.10  Goal of Therapy:  Heparin level 0.3-0.7 units/ml aPTT 66-102 seconds Monitor platelets by anticoagulation protocol: Yes   Plan:  6/17 @ 0505:  aPTT = 146,  HL = > 1.10 - aPTT elevated,  HL elevated from Eliquis PTA - Will hold heparin drip for 1 hr and restart @ 850 units/hr - Will recheck aPTT 8 hrs after restart - Will recheck HL on 6/18 with AM labs. use aPTT to adjust dose until correlation with HL Will check daily CBC while on Heparin infusion  Bryauna Byrum D 08/19/2022 5:39 AM

## 2022-08-19 NOTE — Progress Notes (Addendum)
ANTICOAGULATION CONSULT NOTE - Follow-up Consult  Pharmacy Consult for Heparin Infusion Indication: chest pain/ACS  No Known Allergies  Patient Measurements: Height: 6' (182.9 cm) Weight: 91.9 kg (202 lb 11.2 oz) IBW/kg (Calculated) : 77.6 Heparin Dosing Weight: 91.9 kg  Vital Signs: Temp: 97.8 F (36.6 C) (06/17 1153) Temp Source: Oral (06/17 1153) BP: 124/62 (06/17 1153) Pulse Rate: 81 (06/17 1153)  Labs: Recent Labs    08/18/22 1020 08/18/22 1805 08/18/22 2033 08/18/22 2303 08/19/22 0505 08/19/22 1500  HGB 8.7*  --   --   --  7.6*  --   HCT 27.8*  --   --   --  24.1*  --   PLT 282  --   --   --  245  --   APTT  --   --  43*  --  146* 81*  LABPROT  --   --  21.6*  --   --   --   INR  --   --  1.9*  --   --   --   HEPARINUNFRC  --   --  >1.10*  --  >1.10*  --   CREATININE 1.50*  --   --   --  1.56*  --   TROPONINIHS 258* 1,775* 1,726* 1,446*  --   --      Estimated Creatinine Clearance: 38.7 mL/min (A) (by C-G formula based on SCr of 1.56 mg/dL (H)).   Medical History: Past Medical History:  Diagnosis Date   Cancer (HCC)    skin cancer basal   Carotid arterial disease (HCC)    a. 02/2016 L CEA; b. 01/2017 U/S: patent LICA, 1-39% RICA.   Celiac artery stenosis (HCC)    Coronary artery disease    a. 01/2016 MV: mild apical/basal inferior, apical lateral, mid anterolateral, and mid inferolateral ischemia. EF 57%; b. 02/2016 Cath: LM 40ost, LAD 70p/m, 20d, D1 95 (small), LCX nl, RCA 65m, RPDA 90 (small), EF 55-65%-->med Rx. Rec CABG for recurrent symptoms.   Hearing loss    History of echocardiogram    a. 02/2016 Echo: EF 50-55%, no rwma, mild MR.   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 Troponins peaked at 1726. Pharmacy consulted for  Heparin dosing. Patient was taking Apixaban 2.5mg  bid prior to admission for afib, last dose was on 6/15. Heparin held for blood tinged sputum, resumed 6/17 AM. Hemoglobin trending down to 7.6. aPTT at goal. No overt bleeding noted.  Baseline HL: >1.10 6/17 @ 0505:  aPTT = 146,  HL = 1.10 6/17 @ 1500: HL >1.1, aPTT= 81  Goal of Therapy:  Heparin level 0.3-0.7 units/ml aPTT 66-102 seconds Monitor platelets by anticoagulation protocol: Yes   Plan:  - Continue heparin at 850 units/hr - F/u plans to transition to DOAC after 48 hours medical management - Will confirm aPTT 8 hrs   - Will recheck HL and aPTT daily use aPTT to adjust dose until correlation with HL -Will check daily CBC while on Heparin infusion  Thank you for involving pharmacy in this patient's care.  Enos Fling, PharmD PGY2 Pharmacy Resident 08/19/2022 3:39 PM

## 2022-08-19 NOTE — Consult Note (Signed)
Cardiology Consultation   Patient ID: Brett Riley MRN: [id number]; DOB: [birth year]  Admit date: 08/18/2022 Date of Consult: 08/19/2022  PCP:  Dale Vandalia, MD   [facility] Providers Cardiologist:  Lorine Bears, MD        Patient Profile:   Brett Riley is a 87 y.o. 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 Arville Care.  History of Present Illness:   Brett Riley was initially evaluated in 2017 for preoperative cardiac restratification for left carotid endarterectomy.  In that setting he underwent nuclear stress test that was abnormal.  Subsequent left heart cath procedure performed in 01/2016 showed moderate 40% ostial left main stenosis and 70% mid LAD stenosis.  He had significant diagonal and R PDA disease.  Both of these vessels were very small and medical therapy was recommended.  Echocardiogram at that time revealed an LVEF of 50-55%, normal wall motion, mild mitral regurgitation.  He had not required an ischemic evaluation since.  With regards to his carotid artery disease and PAD he was followed by vascular surgery most recent carotid artery ultrasound in 01/2022 showed stable 1-39% R ICA stenosis with some progression of left ICA stenosis of 40-59%.  Vascular imaging did not show aneurysmal degeneration of the iliac arteries.  He recently underwent endovascular repair of penetrating aortic ulcer with VVS.  He was admitted to [facility] on 07/20/2022 after presenting with acute onset of bilateral low back pain that occurred after he got off of the bed.  In the emergency department blood pressure was found to be 159/73 with a temperature of 99.2, respirations of 21, and  oxygen saturations briefly dropped into the upper 80s on room air which required supplemental oxygen therapy 3 L O2 via nasal cannula.  CTA of the abdomen/pelvis showed a penetrating atherosclerotic ulcer of the infrarenal aorta and abdominal aorta with a 10 mm neck and 7 mm depth.  No aneurysm or dissection was noted at that time.  There was advanced narrowing at the origins of the celiac axis, SMA, and bilateral renal arteries.  Chest x-ray showed no active disease.  Blood pressure remained in the 130s to 180s systolic with most readings in the 150s.  He received LR, morphine, and fentanyl for pain.  He was subsequently taken to surgery by VVS for endovascular repair on 07/24/22. On 07/22/2022 he had new onset atrial fibrillation with heart rates been adequately controlled.  He was continued on anticoagulation with apixaban.  Rate control strategy was obtained with amiodarone, metoprolol, and diltiazem. Postoperatively the patient received physical therapy.  He becomes septic with fever, tachycardic, and altered mental status on 08/01/2022.  Antibiotic started with cefepime and vancomycin.  Blood cultures came back with MSSA on 5/31.  Antibiotics were switched to cefazolin.  MRI of the lumbar spine showed significant osteomyelitis with abscess.  Neurosurgery was consulted but did not recommend surgery at that time.  TEE was performed on 6/3 with no evidence of endocarditis.  On 08/07/22 reevaluation for his right knee, neurosurgery reevaluation for discitis/osteomyelitis recommended TENS unit.  He was considered stable for discharge and was subsequently discharged on 08/09/2022 to [facility] for rehabilitative services.  Patient presented to the [facility] emergency department on 08/18/2022 from [facility]  with complaints of respiratory distress with oxygen saturations of 84% on 2 L of O2 via nasal cannula.  He was placed on CPAP prior to arrival with oxygen saturation improved to 97%.  Continue to have complaints of  shortness of breath that improved with CPAP and nitro placed administered by EMS.  EMS reported he was extremely distressed with increased respirations and hypertensive and appeared to be volume overloaded with crackles throughout his lungs.  CPAP was placed and he was started on Nitropaste on his chest with significant improvement in his symptoms. He also had swelling to his bilateral lower extremities.  He denied any chest discomfort, palpitations, or lightheadedness or dizziness.  Continues to have pain into his mid to lower back which was chronic over the past month.  He also stated he had been suffering from fevers off and on for the last several days.  Initial vital signs: Blood pressure 130/88, pulse 97, respirations of 20, temperature 97.8  Labs: WBCs 10.8, hemoglobin 8.7, hematocrit 27.8, blood glucose of 117, BUN 31, serum creatinine 1.50, calcium 8.2, GFR 46, BNP 279.2, high-sensitivity troponin was 258, 1775, 7026, 1446  Imaging: Chest x-ray revealed bilateral interstitial opacities, most of the over the right upper lobe, findings may reflect pulmonary edema versus atypical/viral infection  Medications administered in the emergency department: Vancomycin 1000 mg IVPB, albuterol nebulizer,cefepime 2 g IVPB, and furosemide 60 mg IVP    Past Medical History:  Diagnosis Date   Cancer (HCC)    skin cancer basal   Carotid arterial disease (HCC)    a. 02/2016 L CEA; b. 01/2017 U/S: patent LICA, 1-39% RICA.   Celiac artery stenosis (HCC)    Coronary artery disease    a. 01/2016 MV: mild apical/basal inferior, apical lateral, mid anterolateral, and mid inferolateral ischemia. EF 57%; b. 02/2016 Cath: LM 40ost, LAD 70p/m, 20d, D1 95 (small), LCX nl, RCA 38m, RPDA 90 (small), EF 55-65%-->med Rx. Rec CABG for recurrent symptoms.   Hearing loss    History of echocardiogram    a. 02/2016 Echo: EF 50-55%, no rwma, mild MR.   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   Osteoarthritis, hip, bilateral    Left > Right    Past Surgical History:  Procedure Laterality Date   AORTIC INTERVENTION N/A 07/24/2022   Procedure: AORTIC INTERVENTION;  Surgeon: Renford Dills, MD;  Location: [facility] INVASIVE CV LAB;  Service: Cardiovascular;  Laterality: N/A;   CARDIAC CATHETERIZATION N/A 01/19/2016   Procedure: Left Heart Cath and Coronary Angiography;  Surgeon: Iran Ouch, MD;  Location: [facility] INVASIVE CV LAB;  Service: Cardiovascular;  Laterality: N/A;   COLONOSCOPY     CYSTOSCOPY/URETEROSCOPY/HOLMIUM LASER/STENT PLACEMENT Right 04/13/2021   Procedure: CYSTOSCOPY/URETEROSCOPY/HOLMIUM LASER/STENT PLACEMENT;  Surgeon: Sondra Come, MD;  Location: [facility] ORS;  Service: Urology;  Laterality: Right;   ENDARTERECTOMY Left 02/15/2016   Procedure: ENDARTERECTOMY CAROTID;  Surgeon: Annice Needy, MD;  Location: [facility] ORS;  Service: Vascular;  Laterality: Left;   TEE WITHOUT CARDIOVERSION N/A 08/05/2022   Procedure: TRANSESOPHAGEAL ECHOCARDIOGRAM;  Surgeon: Debbe Odea, MD;  Location: [facility] ORS;  Service: Cardiovascular;  Laterality: N/A;     Home Medications:  Prior to Admission medications   Medication Sig Start Date End Date Taking? Authorizing Provider  amiodarone (PACERONE) 200 MG tablet Take 1 tablet (200 mg total) by mouth 2 (two) times daily. 08/09/22  Yes Sherryll Burger, Vipul,  MD  apixaban (ELIQUIS) 2.5 MG TABS tablet Take 1 tablet (2.5 mg total) by mouth 2 (two) times daily. 08/09/22  Yes Delfino Lovett, MD  aspirin 81 MG tablet Take 81 mg by mouth at bedtime.    Yes [provider]  ceFAZolin (ANCEF) IVPB Inject 2 g into the vein every 8 (eight) hours. Indication:  Vertebral osteomyelitis  First Dose: Yes Last Day of Therapy:  09/13/22  Labs - Once weekly (Monday):  CBC/D and CMP, Labs - Once weekly (Monday): ESR and CRP Fax weekly lab results  promptly to [phone] Method of administration: IV Push-  Please pull PICC at completion of IV antibiotics  Method of administration may be changed at the discretion of home infusion pharmacist based upon assessment of the patient and/or caregiver's ability to self-administer the medication ordered. 08/09/22 09/15/22 Yes Delfino Lovett, MD  diltiazem (TIAZAC) 420 MG 24 hr capsule Take 420 mg by mouth daily.   Yes [provider]  hydrALAZINE (APRESOLINE) 25 MG tablet Take 25 mg by mouth every 6 (six) hours as needed (SBP >160).   Yes [provider]  lisinopril (ZESTRIL) 40 MG tablet Take 1 tablet (40 mg total) by mouth daily. 02/14/22  Yes Dale Meigs, MD  metoprolol succinate (TOPROL-XL) 100 MG 24 hr tablet TAKE 1 TABLET BY MOUTH EVERY DAY WITH A MEAL 11/21/21  Yes Dale Monessen, MD  Multiple Vitamins-Iron (MULTIVITAMINS WITH IRON) TABS Take 1 tablet by mouth daily.   Yes [provider]  naloxone (NARCAN) nasal spray 4 mg/0.1 mL Place 4 mg into the nose 3 (three) times daily as needed (opioid overdose).   Yes [provider]  pantoprazole (PROTONIX) 40 MG tablet Take 1 tablet (40 mg total) by mouth daily. 08/09/22  Yes Delfino Lovett, MD  polyethylene glycol (MIRALAX / GLYCOLAX) 17 g packet Take 17 g by mouth daily.   Yes [provider]  rosuvastatin (CRESTOR) 10 MG tablet Take 1 tablet (10 mg total) by mouth daily. 02/14/22  Yes Dale [place], MD  senna (SENOKOT) 8.6 MG TABS tablet Take 1 tablet by mouth daily.   Yes [provider]  sodium chloride (OCEAN) 0.65 % SOLN nasal spray Place 1 spray into both nostrils as needed for congestion. 08/09/22  Yes Delfino Lovett, MD  traMADol (ULTRAM) 50 MG tablet Take 50 mg by mouth every 8 (eight) hours as needed for moderate pain.   Yes [provider]  traMADol HCl 100 MG TABS Take 100 mg by mouth every 12 (twelve) hours as needed (severe pain).   Yes [provider]  vitamin B-12 (CYANOCOBALAMIN) 1000 MCG tablet Take 1,000 mcg by mouth daily.   Yes  [provider]  diltiazem (CARDIZEM CD) 360 MG 24 hr capsule Take 1 capsule (360 mg total) by mouth daily. Patient not taking: Reported on 08/18/2022 08/09/22   Delfino Lovett, MD  feeding supplement (ENSURE ENLIVE / ENSURE PLUS) LIQD Take 237 mLs by mouth 2 (two) times daily between meals. 08/09/22   Delfino Lovett, MD    Inpatient Medications: Scheduled Meds:  amiodarone  200 mg Oral BID   aspirin EC  81 mg Oral QHS   Chlorhexidine Gluconate Cloth  6 each Topical Daily   cyanocobalamin  1,000 mcg Oral Daily   diltiazem  420 mg Oral Daily   feeding supplement  237 mL Oral BID BM   furosemide  60 mg Intravenous Q12H   furosemide  60 mg Intravenous Once   lisinopril  40  mg Oral Daily   metoprolol succinate  100 mg Oral Daily   [START ON 08/20/2022] multivitamins with iron  1 tablet Oral Daily   pantoprazole  40 mg Oral Daily   polyethylene glycol  17 g Oral Daily   potassium chloride  20 mEq Oral Once   rosuvastatin  40 mg Oral Daily   senna  1 tablet Oral Daily   Continuous Infusions:  ceFAZolin 2 g (08/19/22 0507)   heparin 850 Units/hr (08/19/22 0723)   PRN Meds: acetaminophen, albuterol, dextromethorphan-guaiFENesin, hydrALAZINE, ondansetron (ZOFRAN) IV, traMADol  Allergies:   No Known Allergies  Social History:   Social History   Socioeconomic History   Marital status: Married    Spouse name: Not on file   Number of children: 1   Years of education: Not on file   Highest education level: Not on file  Occupational History    Employer: [employer]  Tobacco Use   Smoking status: Never   Smokeless tobacco: Never  Vaping Use   Vaping Use: Never used  Substance and Sexual Activity   Alcohol use: No    Alcohol/week: 0.0 standard drinks of alcohol   Drug use: No   Sexual activity: Not Currently  Other Topics Concern   Not on file  Social History Narrative   Not on file   Social Determinants of Health   Financial Resource Strain: Low Risk  (08/29/2020)    Overall Financial Resource Strain (CARDIA)    Difficulty of Paying Living Expenses: Not hard at all  Food Insecurity: No Food Insecurity (08/18/2022)   Hunger Vital Sign    Worried About Running Out of Food in the Last Year: Never true    Ran Out of Food in the Last Year: Never true  Transportation Needs: No Transportation Needs (08/18/2022)   PRAPARE - Administrator, Civil Service (Medical): No    Lack of Transportation (Non-Medical): No  Physical Activity: Insufficiently Active (08/29/2020)   Exercise Vital Sign    Days of Exercise per Week: 3 days    Minutes of Exercise per Session: 20 min  Stress: No Stress Concern Present (08/29/2020)   [employer] of Occupational Health - Occupational Stress Questionnaire    Feeling of Stress : Not at all  Social Connections: Unknown (08/29/2020)   Social Connection and Isolation Panel [NHANES]    Frequency of Communication with Friends and Family: Not on file    Frequency of Social Gatherings with Friends and Family: Not on file    Attends Religious Services: Not on file    Active Member of Clubs or Organizations: Not on file    Attends Banker Meetings: Not on file    Marital Status: Married  Intimate Partner Violence: Not At Risk (08/18/2022)   Humiliation, Afraid, Rape, and Kick questionnaire    Fear of Current or Ex-Partner: No    Emotionally Abused: No    Physically Abused: No    Sexually Abused: No    Family History:    Family History  Problem Relation Age of Onset   Stroke Mother    Heart disease Father        MI   Heart attack Father    Breast cancer Sister    Colon cancer Neg Hx    Prostate cancer Neg Hx      ROS:  Please see the history of present illness.  Review of Systems  Constitutional:  Positive for malaise/fatigue.  HENT:  Positive for hearing  loss.   Respiratory:  Positive for cough, hemoptysis and shortness of breath.   Cardiovascular:  Positive for leg swelling.  Musculoskeletal:   Positive for back pain.  Neurological:  Positive for weakness.    All other ROS reviewed and negative.     Physical Exam/Data:   Vitals:   08/18/22 1930 08/18/22 2115 08/19/22 0414 08/19/22 0757  BP:  134/72 108/84 (!) 159/76  Pulse:  91 83 86  Resp:  (!) 24 (!) 21 16  Temp: 97.9 F (36.6 C) 98.3 F (36.8 C) 97.8 F (36.6 C) 98.4 F (36.9 C)  TempSrc: Oral Oral  Oral  SpO2:  95% 96% (!) 85%  Weight:  91.9 kg    Height:  6' (1.829 m)      Intake/Output Summary (Last 24 hours) at 08/19/2022 0914 Last data filed at 08/19/2022 0756 Gross per 24 hour  Intake --  Output 2600 ml  Net -2600 ml      08/18/2022    9:15 PM 08/08/2022    3:04 AM 08/07/2022    5:14 AM  Last 3 Weights  Weight (lbs) 202 lb 11.2 oz 209 lb 14.1 oz 208 lb 15.9 oz  Weight (kg) 91.944 kg 95.2 kg 94.8 kg     Body mass index is 27.49 kg/m.  General: Chronically ill-appearing visibly fatigued HEENT: normal Neck: + JVD Vascular: No carotid bruits; Distal pulses 2+ bilaterally Cardiac:  normal S1, S2; RRR; no murmur  Lungs:  crackles to auscultation bilaterally respirations are unlabored at rest on 2 L of O2 via nasal cannula Abd: soft, nontender, no hepatomegaly  Ext: 2+ edema to BLE Musculoskeletal:  No deformities, BUE and BLE strength normal and equal Skin: warm and dry  Neuro:  CNs 2-12 intact, no focal abnormalities noted Psych:  Normal affect   EKG:  The EKG was personally reviewed and demonstrates: Sinus rhythm with a rate of 91 with ST depression T wave inversions in lateral leads Telemetry:  Telemetry was personally reviewed and demonstrates: Sinus rhythm with 1 7 beat run nonsustained V. tach noted on telemetry  Relevant CV Studies:  Limited echocardiogram ordered and pending  TEE 08/05/22 1. Left ventricular ejection fraction, by estimation, is 55 to 60%. The  left ventricle has normal function.   2. Right ventricular systolic function is normal. The right ventricular  size is normal.    3. No left atrial/left atrial appendage thrombus was detected.   4. The mitral valve is normal in structure. Mild mitral valve  regurgitation.   5. The aortic valve is tricuspid. Aortic valve regurgitation is trivial.   TTE 07/22/22 1. Left ventricular ejection fraction, by estimation, is 55 to 60%. The  left ventricle has normal function. The left ventricle has no regional  wall motion abnormalities. Left ventricular diastolic parameters are  consistent with Grade I diastolic  dysfunction (impaired relaxation).   2. Right ventricular systolic function is normal. The right ventricular  size is normal. There is normal pulmonary artery systolic pressure.   3. The mitral valve is normal in structure. Mild to moderate mitral valve  regurgitation. No evidence of mitral stenosis. Moderate mitral annular  calcification.   4. The aortic valve is normal in structure. Aortic valve regurgitation is  trivial. Aortic valve sclerosis is present, with no evidence of aortic  valve stenosis.   5. The inferior vena cava is normal in size with greater than 50%  respiratory variability, suggesting right atrial pressure of 3 mmHg.   Laboratory Data:  High Sensitivity Troponin:   Recent Labs  Lab 08/18/22 1020 08/18/22 1805 08/18/22 2033 08/18/22 2303  TROPONINIHS 258* 1,775* 1,726* 1,446*     Chemistry Recent Labs  Lab 08/18/22 1020 08/18/22 1046 08/19/22 0505  NA 139  --  140  K 3.6  --  3.4*  CL 106  --  106  CO2 22  --  25  GLUCOSE 117*  --  100*  BUN 31*  --  31*  CREATININE 1.50*  --  1.56*  CALCIUM 8.2*  --  7.9*  MG  --  2.0  --   GFRNONAA 46*  --  44*  ANIONGAP 11  --  9    No results for input(s): "PROT", "ALBUMIN", "AST", "ALT", "ALKPHOS", "BILITOT" in the last 168 hours. Lipids  Recent Labs  Lab 08/18/22 1020  CHOL 84  TRIG 73  HDL 35*  LDLCALC 34  CHOLHDL 2.4    Hematology Recent Labs  Lab 08/18/22 1020 08/19/22 0505  WBC 10.8* 9.3  RBC 2.83* 2.51*  HGB 8.7*  7.6*  HCT 27.8* 24.1*  MCV 98.2 96.0  MCH 30.7 30.3  MCHC 31.3 31.5  RDW 13.8 13.8  PLT 282 245   Thyroid No results for input(s): "TSH", "FREET4" in the last 168 hours.  BNP Recent Labs  Lab 08/18/22 1020  BNP 279.2*    DDimer No results for input(s): "DDIMER" in the last 168 hours.   Radiology/Studies:  DG Chest Port 1 View  Result Date: 08/18/2022 CLINICAL DATA:  Dyspnea EXAM: PORTABLE CHEST 1 VIEW COMPARISON:  08/01/2022 FINDINGS: Interval placement of right-sided PICC line with distal tip terminating at the level of the distal SVC. Stable heart size. Aortic atherosclerosis. Bilateral interstitial opacities, most confluent within the right upper lobe. Possible trace bilateral pleural effusions. No pneumothorax. IMPRESSION: Bilateral interstitial opacities, most confluent within the right upper lobe. Findings may reflect pulmonary edema versus atypical/viral infection. Electronically Signed   By: Duanne Guess D.O.   On: 08/18/2022 10:38     Assessment and Plan:   Acute on chronic HFpEF -Presented with gradual onset of shortness of breath over the last several days with worsening peripheral edema -BNP 279.2 - -2.6 L output since admission -Continued on IV furosemide with dosing increased to 60 mg twice daily -Daily BMP while receiving diuretic therapy -Limited echocardiogram ordered and pending with further recommendations to follow -Continue with heart failure education -Daily weights, I's and O's, low-sodium diet  NSTEMI versus demand ischemia secondary to heart failure exacerbation, fever, infection -High-sensitivity troponins trended peaked at 1775 -Continues to remain chest pain-free -No ischemic changes noted on EKG -Continued on heparin infusion for now -Continue on aspirin and statin therapy -No current plans for invasive evaluation -Continue with telemetry monitoring -EKG as needed for pain or changes  Anemia -Hemoglobin 7.6 -If continued drop in  hemoglobin may have to stop heparin -No active signs of bleeding noted -Daily CBC -Continued management by IM  Mild hypokalemia -Potassium 3.4 -Supplementation ordered -Daily BMP -Recommend keeping serum potassium greater than 4 less than 5 -Monitor/trend/replete electrolytes as needed  Hypertension -Blood pressure 159/76 -Continued on diltiazem, furosemide, lisinopril, and Toprol-XL -Vital signs per unit protocol  Hyperlipidemia -Continued on rosuvastatin  Paroxysmal atrial fibrillation -Currently maintaining sinus rhythm -Continued on amiodarone 200 mg twice daily -Rate control continue with diltiazem and Toprol-XL -Apixaban currently on hold while on heparin infusion will need to be restarted once heparin is discontinued -Continue with telemetry monitoring  Chronic kidney disease stage  IIIa -Serum creatinine 1.56 -Baseline serum creatinine of 1.5-1.9 -Daily BMP -Monitor urine output -Monitor/trend/replete electrolytes as needed -Avoid nephrotoxic agents were able  Recent history of MSSA bacteremia with vertebral osteomyelitis -Repeat blood cultures -Continued IV antibiotic therapy -Management per IM   Risk Assessment/Risk Scores:     TIMI Risk Score for Unstable Angina or Non-ST Elevation MI:   The patient's TIMI risk score is 5, which indicates a 26% risk of all cause mortality, new or recurrent myocardial infarction or need for urgent revascularization in the next 14 days.  New York Heart Association (NYHA) Functional Class NYHA Class III  CHA2DS2-VASc Score = 4   This indicates a 4.8% annual risk of stroke. The patient's score is based upon: CHF History: 0 HTN History: 1 Diabetes History: 0 Stroke History: 0 Vascular Disease History: 1 Age Score: 2 Gender Score: 0         For questions or updates, please contact [facility] Please consult [email or web address] for contact info under    Signed, Otha Monical, NP  08/19/2022 9:14 AM

## 2022-08-19 NOTE — Plan of Care (Signed)
?  Problem: Education: ?Goal: Ability to demonstrate management of disease process will improve ?Outcome: Progressing ?  ?Problem: Activity: ?Goal: Capacity to carry out activities will improve ?Outcome: Progressing ?  ?Problem: Cardiac: ?Goal: Ability to achieve and maintain adequate cardiopulmonary perfusion will improve ?Outcome: Progressing ?  ?

## 2022-08-19 NOTE — Progress Notes (Signed)
[facility]  ([facility]) Hospital Liaison note:  This patient is currently enrolled in [facility] outpatient-based palliative care.  [facility] will continue  to follow for discharge disposition.    Please call for any outpatient based palliative care related questions or concerns.  [facility] Liaison [phone] 

## 2022-08-20 DIAGNOSIS — I480 Paroxysmal atrial fibrillation: Secondary | ICD-10-CM | POA: Diagnosis not present

## 2022-08-20 DIAGNOSIS — I5033 Acute on chronic diastolic (congestive) heart failure: Secondary | ICD-10-CM | POA: Diagnosis not present

## 2022-08-20 DIAGNOSIS — J9601 Acute respiratory failure with hypoxia: Secondary | ICD-10-CM | POA: Diagnosis not present

## 2022-08-20 LAB — BASIC METABOLIC PANEL WITH GFR
Anion gap: 12 (ref 5–15)
BUN: 37 mg/dL — ABNORMAL HIGH (ref 8–23)
CO2: 24 mmol/L (ref 22–32)
Calcium: 7.8 mg/dL — ABNORMAL LOW (ref 8.9–10.3)
Chloride: 99 mmol/L (ref 98–111)
Creatinine, Ser: 1.64 mg/dL — ABNORMAL HIGH (ref 0.61–1.24)
GFR, Estimated: 41 mL/min — ABNORMAL LOW
Glucose, Bld: 96 mg/dL (ref 70–99)
Potassium: 2.7 mmol/L — CL (ref 3.5–5.1)
Sodium: 135 mmol/L (ref 135–145)

## 2022-08-20 LAB — CBC
HCT: 24 % — ABNORMAL LOW (ref 39.0–52.0)
Hemoglobin: 7.7 g/dL — ABNORMAL LOW (ref 13.0–17.0)
MCH: 30.1 pg (ref 26.0–34.0)
MCHC: 32.1 g/dL (ref 30.0–36.0)
MCV: 93.8 fL (ref 80.0–100.0)
Platelets: 285 10*3/uL (ref 150–400)
RBC: 2.56 MIL/uL — ABNORMAL LOW (ref 4.22–5.81)
RDW: 13.8 % (ref 11.5–15.5)
WBC: 9.8 10*3/uL (ref 4.0–10.5)
nRBC: 0 % (ref 0.0–0.2)

## 2022-08-20 LAB — PREPARE RBC (CROSSMATCH)

## 2022-08-20 LAB — HEMOGLOBIN AND HEMATOCRIT, BLOOD
HCT: 27.5 % — ABNORMAL LOW (ref 39.0–52.0)
Hemoglobin: 9 g/dL — ABNORMAL LOW (ref 13.0–17.0)

## 2022-08-20 LAB — MAGNESIUM: Magnesium: 1.7 mg/dL (ref 1.7–2.4)

## 2022-08-20 LAB — GLUCOSE, CAPILLARY: Glucose-Capillary: 89 mg/dL (ref 70–99)

## 2022-08-20 LAB — POTASSIUM: Potassium: 3.6 mmol/L (ref 3.5–5.1)

## 2022-08-20 MED ORDER — POTASSIUM CHLORIDE 10 MEQ/100ML IV SOLN
10.0000 meq | INTRAVENOUS | Status: DC
  Filled 2022-08-20 (×4): qty 100

## 2022-08-20 MED ORDER — POTASSIUM CHLORIDE CRYS ER 20 MEQ PO TBCR: 40.0000 meq | EXTENDED_RELEASE_TABLET | ORAL | Status: DC

## 2022-08-20 MED ORDER — MAGNESIUM SULFATE 2 GM/50ML IV SOLN
2.0000 g | Freq: Once | INTRAVENOUS | Status: AC
  Administered 2022-08-20: 2 g via INTRAVENOUS
  Filled 2022-08-20: qty 50

## 2022-08-20 MED ORDER — AMIODARONE HCL 200 MG PO TABS
200.0000 mg | ORAL_TABLET | Freq: Every day | ORAL | Status: DC
  Administered 2022-08-21: 200 mg via ORAL
  Filled 2022-08-20: qty 1

## 2022-08-20 MED ORDER — POTASSIUM CHLORIDE CRYS ER 20 MEQ PO TBCR: 40.0000 meq | EXTENDED_RELEASE_TABLET | Freq: Once | ORAL | Status: DC

## 2022-08-20 MED ORDER — SIMETHICONE 80 MG PO CHEW
80.0000 mg | CHEWABLE_TABLET | Freq: Four times a day (QID) | ORAL | Status: DC | PRN
  Administered 2022-08-20: 80 mg via ORAL
  Filled 2022-08-20: qty 1

## 2022-08-20 MED ORDER — SODIUM CHLORIDE 0.9% IV SOLUTION: Freq: Once | INTRAVENOUS | Status: AC

## 2022-08-20 MED ORDER — POTASSIUM CHLORIDE CRYS ER 20 MEQ PO TBCR
40.0000 meq | EXTENDED_RELEASE_TABLET | ORAL | Status: DC
  Administered 2022-08-20: 40 meq via ORAL
  Filled 2022-08-20: qty 2

## 2022-08-20 MED ORDER — SALINE SPRAY 0.65 % NA SOLN
1.0000 | NASAL | Status: DC | PRN
  Administered 2022-08-20 – 2022-08-27 (×2): 1 via NASAL
  Filled 2022-08-20: qty 44

## 2022-08-20 MED ORDER — POTASSIUM CHLORIDE CRYS ER 20 MEQ PO TBCR
40.0000 meq | EXTENDED_RELEASE_TABLET | Freq: Once | ORAL | Status: AC
  Administered 2022-08-20: 40 meq via ORAL
  Filled 2022-08-20: qty 2

## 2022-08-20 MED ORDER — FUROSEMIDE 10 MG/ML IJ SOLN
20.0000 mg | Freq: Once | INTRAMUSCULAR | Status: AC
  Administered 2022-08-20: 20 mg via INTRAVENOUS
  Filled 2022-08-20: qty 2

## 2022-08-20 NOTE — Progress Notes (Signed)
PT Cancellation Note  Patient Details Name: Brett Riley MRN: [id number] DOB: Jul 12, 1935   Cancelled Treatment:    Reason Eval/Treat Not Completed: Other (comment). Consult received and chart reviewed. Per MD, will hold off this date secondary to low K+ and pending blood transfusion. Will re-attempt next date.   [name] 08/20/2022, 2:37 PM Elizabeth Palau, PT, DPT, GCS [phone]

## 2022-08-20 NOTE — Progress Notes (Signed)
PROGRESS NOTE    LORIMER TAKASHIMA   UEA:[id number] DOB: 16-Jul-1935  DOA: 08/18/2022 Date of Service: 08/20/22 PCP: Dale Camp Swift, MD     Brief Narrative / Hospital Course:  Brett Riley is a 87 y.o. male with medical history significant of dCHF, HTN, HLD, CAD, CKD-3a, anemia, A fib on Eliquis, who presents 08/18/2022 to ED from rehab, chief complaint SOB.  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 Net IO Since Admission: -5,549.14 mL [08/20/22 1608]. Heparin stopped d/t rectal bleeding overnight.   Consultants:  Cardiology   Procedures: none      ASSESSMENT & PLAN:   Principal Problem:   Acute respiratory failure with hypoxia (HCC) Active Problems:   Acute on chronic diastolic CHF (congestive heart failure) (HCC)   MSSA bacteremia   Vertebral osteomyelitis (HCC)   CAD (coronary artery disease)   Myocardial injury   Hypertension   Hyperlipidemia   Chronic kidney disease, stage 3a (HCC)   Normocytic anemia   Atrial fibrillation, chronic (HCC)  Acute on chronic respiratory failure with hypoxia due to acute on chronic diastolic CHF (congestive heart failure):  2D echo on 07/21/21 showed  EF of 55-60% with grade 1 diastolic dysfunction Limited echocardiogram revealed LVEF of 50-55% with RV systolic function is normal  CXR questionable for PNA but does not seem clinically to have PNA and procalcitonon <0.10 so abx were held  BiPAP prn Lasix increased to 60 mg bid Repeat limited echo per cardiology , eval new systolic dysfunction Daily weights, strict I/O's, Low  salt diet, Fluid restriction   Atrial fibrillation, chronic (HCC): Heart rate 97 Amiodarone, ASA, diltiazem, lisinopril, metoprolol Hold Eliquis   Recent hx of MSSA bacteremia vertebral osteomyelitis (HCC): continue IV Ancef  repeat Blood culture NGx2d   CAD (coronary artery disease) and Myocardial injury:  trop  258, possibly due to demand ischemia secondary to CHF exacerbation.  Patient does not have chest pain. Trend troponin --> 1775 and heparin gtt started --> 1446 Heparin gtt  Check A1c, FLP Crestor, beta blocker, Aspirin 81 mg daily  Cardiology following  Will need coronary angiography at some point but will defer for now     Hypertension IV hydralazine as needed Other cardiac meds as above    Hyperlipidemia Crestor   Hypokalemia Replace as needed Monitor BMP  GERD PPI  Chronic kidney disease, stage 3a (HCC):  Renal function stable.  Baseline creatinine 1.5-1.9 recently treated his creatinine is 1.50, BUN 31, GFR 46 Follow BMP   Normocytic anemia:  ABLA w/ rectal bleed Transfusing 1 unit PRBC today Follow CBC     DVT prophylaxis: on heparin gtt  Pertinent IV fluids/nutrition: no continuous IV fluids  Central lines / invasive devices: PICC in R Brachial    Code Status: DNR, see H&P, discuss w/ pt/family if respiratory deterioration requiring intubation, no CPR ACP documentation reviewed: 08/19/22 - HCPOA w/ 1) Dava Najjar, alternates 2) Erling Conte, 3) Sandra Cockayne FIelds.   Current Admission Status: inpatient  TOC needs / Dispo plan: TBD Barriers to discharge / significant pending items: cardiology recs, CHF tx diuresing, needing blood  transfusion today              Subjective / Brief ROS:  Patient reports fatigue and feeling significantly SOB Pain controlled.  Denies new weakness.  Tolerating diet  Family Communication: wife at bedside on rounds     Objective Findings:  Vitals:   08/20/22 1525 08/20/22 1542 08/20/22 1551  08/20/22 1607  BP: 123/72 123/85 123/85 122/85  Pulse: 67 69 67 65  Resp: 18 16  16   Temp: 98.6 F (37 C) 98.5 F (36.9 C)  98.8 F (37.1 C)  TempSrc: Oral Oral    SpO2: 98% 94% 95% 93%  Weight:      Height:        Intake/Output Summary (Last 24 hours) at 08/20/2022 1610 Last data filed at 08/20/2022 1607 Gross per 24 hour  Intake 1090.86 ml  Output 3200 ml  Net -2109.14 ml   Filed Weights   08/18/22 2115 08/20/22 0321  Weight: 91.9 kg 90 kg    Examination:  Physical Exam Constitutional:      General: He is not in acute distress. Cardiovascular:     Rate and Rhythm: Normal rate and regular rhythm.     Heart sounds: Normal heart sounds.  Pulmonary:     Effort: Pulmonary effort is normal.     Breath sounds: Rales present.  Abdominal:     Palpations: Abdomen is soft.  Musculoskeletal:     Right lower leg: Edema present.     Left lower leg: Edema present.  Neurological:     General: No focal deficit present.     Mental Status: He is alert. Mental status is at baseline.  Psychiatric:        Mood and Affect: Mood normal.        Behavior: Behavior normal.          Scheduled Medications:   amiodarone  200 mg Oral BID   aspirin EC  81 mg Oral QHS   Chlorhexidine Gluconate Cloth  6 each Topical Daily   cyanocobalamin  1,000 mcg Oral Daily   diltiazem  420 mg Oral Daily   feeding supplement  237 mL Oral BID BM   furosemide  60 mg Intravenous Q12H   lisinopril  40 mg Oral Daily   metoprolol succinate  100 mg Oral Daily   multivitamins with iron  1 tablet Oral Daily   pantoprazole  40 mg Oral Daily   polyethylene glycol  17 g Oral Daily   rosuvastatin  40 mg Oral Daily   senna  1 tablet Oral Daily    Continuous Infusions:  ceFAZolin 2 g (08/20/22 1423)    PRN Medications:  acetaminophen, albuterol, dextromethorphan-guaiFENesin, hydrALAZINE, ondansetron (ZOFRAN) IV, sodium chloride, traMADol  Antimicrobials from admission:  Anti-infectives (From  admission, onward)    Start     Dose/Rate Route Frequency Ordered Stop   08/18/22 1400  ceFAZolin (ANCEF) IVPB 2g/100 mL premix       Note to Pharmacy: Indication:  Vertebral osteomyelitis  First Dose: Yes Last Day of Therapy:  09/13/22  Labs - Once weekly (Monday):  CBC/D and CMP, Labs - Once weekly (Monday): ESR and CRP Fax weekly lab results  promptly to [phone] Method of administration: IV Push- Please pull PICC at completion of IV   2 g 200 mL/hr over 30 Minutes Intravenous Every 8 hours 08/18/22 1319 09/13/22 2359   08/18/22 1115  vancomycin (VANCOCIN) IVPB 1000 mg/200 mL premix  1,000 mg 200 mL/hr over 60 Minutes Intravenous  Once 08/18/22 1107 08/18/22 1322   08/18/22 1115  ceFEPIme (MAXIPIME) 2 g in sodium chloride 0.9 % 100 mL IVPB        2 g 200 mL/hr over 30 Minutes Intravenous  Once 08/18/22 1107 08/18/22 1211           Data Reviewed:  I have personally reviewed the following...  CBC: Recent Labs  Lab 08/18/22 1020 08/19/22 0505 08/20/22 0540  WBC 10.8* 9.3 9.8  NEUTROABS 8.9*  --   --   HGB 8.7* 7.6* 7.7*  HCT 27.8* 24.1* 24.0*  MCV 98.2 96.0 93.8  PLT 282 245 285   Basic Metabolic Panel: Recent Labs  Lab 08/18/22 1020 08/18/22 1046 08/19/22 0505 08/20/22 0540  NA 139  --  140 135  K 3.6  --  3.4* 2.7*  CL 106  --  106 99  CO2 22  --  25 24  GLUCOSE 117*  --  100* 96  BUN 31*  --  31* 37*  CREATININE 1.50*  --  1.56* 1.64*  CALCIUM 8.2*  --  7.9* 7.8*  MG  --  2.0  --  1.7   GFR: Estimated Creatinine Clearance: 36.8 mL/min (A) (by C-G formula based on SCr of 1.64 mg/dL (H)). Liver Function Tests: No results for input(s): "AST", "ALT", "ALKPHOS", "BILITOT", "PROT", "ALBUMIN" in the last 168 hours. No results for input(s): "LIPASE", "AMYLASE" in the last 168 hours. No results for input(s): "AMMONIA" in the last 168 hours. Coagulation Profile: Recent Labs  Lab 08/18/22 2033  INR 1.9*   Cardiac Enzymes: No results for  input(s): "CKTOTAL", "CKMB", "CKMBINDEX", "TROPONINI" in the last 168 hours. BNP (last 3 results) No results for input(s): "PROBNP" in the last 8760 hours. HbA1C: Recent Labs    08/18/22 2040  HGBA1C 5.4   CBG: Recent Labs  Lab 08/20/22 0508  GLUCAP 89   Lipid Profile: Recent Labs    08/18/22 1020  CHOL 84  HDL 35*  LDLCALC 34  TRIG 73  CHOLHDL 2.4   Thyroid Function Tests: No results for input(s): "TSH", "T4TOTAL", "FREET4", "T3FREE", "THYROIDAB" in the last 72 hours. Anemia Panel: No results for input(s): "VITAMINB12", "FOLATE", "FERRITIN", "TIBC", "IRON", "RETICCTPCT" in the last 72 hours. Most Recent Urinalysis On File:     Component Value Date/Time   COLORURINE AMBER (A) 08/01/2022 1155   APPEARANCEUR CLOUDY (A) 08/01/2022 1155   LABSPEC 1.013 08/01/2022 1155   PHURINE 5.0 08/01/2022 1155   GLUCOSEU NEGATIVE 08/01/2022 1155   HGBUR LARGE (A) 08/01/2022 1155   BILIRUBINUR NEGATIVE 08/01/2022 1155   KETONESUR NEGATIVE 08/01/2022 1155   PROTEINUR 100 (A) 08/01/2022 1155   NITRITE NEGATIVE 08/01/2022 1155   LEUKOCYTESUR NEGATIVE 08/01/2022 1155   Sepsis Labs: @LABRCNTIP (procalcitonin:4,lacticidven:4) Microbiology: Recent Results (from the past 240 hour(s))  Blood culture (routine x 2)     Status: None (Preliminary result)   Collection Time: 08/18/22 10:45 AM   Specimen: BLOOD LEFT ARM  Result Value Ref Range Status   Specimen Description BLOOD LEFT ARM  Final   Special Requests   Final    BOTTLES DRAWN AEROBIC AND ANAEROBIC Blood Culture adequate volume   Culture   Final    NO GROWTH 2 DAYS Performed at [facility], [address] [place], [place] [address]    Report Status PENDING  Incomplete  Resp Panel by RT-PCR (Flu A&B, Covid) Anterior Nasal Swab     Status: None  Collection Time: 08/18/22 10:46 AM   Specimen: Anterior Nasal Swab  Result Value Ref Range Status   SARS Coronavirus 2 by RT PCR NEGATIVE NEGATIVE Final    Comment:  (NOTE) SARS-CoV-2 target nucleic acids are NOT DETECTED.  The SARS-CoV-2 RNA is generally detectable in upper respiratory specimens during the acute phase of infection. The lowest concentration of SARS-CoV-2 viral copies this assay can detect is 138 copies/mL. A negative result does not preclude SARS-Cov-2 infection and should not be used as the sole basis for treatment or other patient management decisions. A negative result may occur with  improper specimen collection/handling, submission of specimen other than nasopharyngeal swab, presence of viral mutation(s) within the areas targeted by this assay, and inadequate number of viral copies(<138 copies/mL). A negative result must be combined with clinical observations, patient history, and epidemiological information. The expected result is Negative.  Fact Sheet for Patients:  BloggerCourse.com  Fact Sheet for Healthcare Providers:  SeriousBroker.it  This test is no t yet approved or cleared by the [place] FDA and  has been authorized for detection and/or diagnosis of SARS-CoV-2 by FDA under an Emergency Use Authorization (EUA). This EUA will remain  in effect (meaning this test can be used) for the duration of the COVID-19 declaration under Section 564(b)(1) of the Act, 21 U.S.C.section 360bbb-3(b)(1), unless the authorization is terminated  or revoked sooner.       Influenza A by PCR NEGATIVE NEGATIVE Final   Influenza B by PCR NEGATIVE NEGATIVE Final    Comment: (NOTE) The Xpert Xpress SARS-CoV-2/FLU/RSV plus assay is intended as an aid in the diagnosis of influenza from Nasopharyngeal swab specimens and should not be used as a sole basis for treatment. Nasal washings and aspirates are unacceptable for Xpert Xpress SARS-CoV-2/FLU/RSV testing.  Fact Sheet for Patients: BloggerCourse.com  Fact Sheet for Healthcare  Providers: SeriousBroker.it  This test is not yet approved or cleared by the [place] FDA and has been authorized for detection and/or diagnosis of SARS-CoV-2 by FDA under an Emergency Use Authorization (EUA). This EUA will remain in effect (meaning this test can be used) for the duration of the COVID-19 declaration under Section 564(b)(1) of the Act, 21 U.S.C. section 360bbb-3(b)(1), unless the authorization is terminated or revoked.  Performed at [facility], [address] [place], [place] [address]   Culture, blood (Routine X 2) w Reflex to ID Panel     Status: None (Preliminary result)   Collection Time: 08/18/22  9:19 PM   Specimen: BLOOD LEFT ARM  Result Value Ref Range Status   Specimen Description BLOOD LEFT ARM  Final   Special Requests   Final    BOTTLES DRAWN AEROBIC AND ANAEROBIC Blood Culture adequate volume   Culture   Final    NO GROWTH 2 DAYS Performed at [facility], [address] [place], [place] [address]    Report Status PENDING  Incomplete      Radiology Studies last 3 days: ECHOCARDIOGRAM LIMITED  Result Date: 08/19/2022    ECHOCARDIOGRAM LIMITED REPORT   Patient Name:   KAIDAN ESCAMILLA Date of Exam: 08/19/2022 Medical Rec #:  [id number]      Height:       72.0 in Accession #:    [id number]     Weight:       202.7 lb Date of Birth:  Jun 28, 1935     BSA:          2.143 m Patient Age:  86 years       BP:           124/62 mmHg Patient Gender: M              HR:           66 bpm. Exam Location:  [facility] Procedure: Limited Echo Indications:     Dyspnea  History:         Patient has prior history of Echocardiogram examinations, most                  recent 08/05/2022. CHF, CAD, Arrythmias:Atrial Fibrillation,                  Signs/Symptoms:Dyspnea, Dizziness/Lightheadedness, Fatigue and                  Bacteremia; Risk Factors:Hypertension and Dyslipidemia.  Sonographer:     Mikki Harbor Referring Phys:  [id number]  SHERI HAMMOCK Diagnosing Phys: Yvonne Kendall MD  Sonographer Comments: Image acquisition challenging due to respiratory motion. IMPRESSIONS  1. Left ventricular ejection fraction, by estimation, is 50 to 55%. The left ventricle has low normal function. Left ventricular endocardial border not optimally defined to evaluate regional wall motion.  2. Right ventricular systolic function is normal. FINDINGS  Left Ventricle: Left ventricular ejection fraction, by estimation, is 50 to 55%. The left ventricle has low normal function. Left ventricular endocardial border not optimally defined to evaluate regional wall motion. The left ventricular internal cavity  size was normal in size. There is no left ventricular hypertrophy. Right Ventricle: No increase in right ventricular wall thickness. Right ventricular systolic function is normal. Mitral Valve: Mild mitral annular calcification. Aorta: The aortic root is normal in size and structure. LEFT VENTRICLE PLAX 2D LVIDd:         4.90 cm LVIDs:         3.50 cm LV PW:         1.00 cm LV IVS:        0.90 cm LVOT diam:     2.10 cm LVOT Area:     3.46 cm  LV Volumes (MOD) LV vol d, MOD A2C: 84.0 ml LV vol d, MOD A4C: 106.0 ml LV vol s, MOD A2C: 52.7 ml LV vol s, MOD A4C: 48.7 ml LV SV MOD A2C:     31.3 ml LV SV MOD A4C:     106.0 ml LV SV MOD BP:      48.5 ml LEFT ATRIUM         Index LA diam:    3.70 cm 1.73 cm/m   AORTA Ao Root diam: 3.80 cm  SHUNTS Systemic Diam: 2.10 cm Yvonne Kendall MD Electronically signed by Yvonne Kendall MD Signature Date/Time: 08/19/2022/4:03:59 PM    Final    DG Chest Port 1 View  Result Date: 08/18/2022 CLINICAL DATA:  Dyspnea EXAM: PORTABLE CHEST 1 VIEW COMPARISON:  08/01/2022 FINDINGS: Interval placement of right-sided PICC line with distal tip terminating at the level of the distal SVC. Stable heart size. Aortic atherosclerosis. Bilateral interstitial opacities, most confluent within the right upper lobe. Possible trace bilateral pleural  effusions. No pneumothorax. IMPRESSION: Bilateral interstitial opacities, most confluent within the right upper lobe. Findings may reflect pulmonary edema versus atypical/viral infection. Electronically Signed   By: Duanne Guess D.O.   On: 08/18/2022 10:38             LOS: 2 days      Sunnie Nielsen, DO [facility]  08/20/2022, 4:10 PM    Dictation software may have been used to generate the above note. Typos may occur and escape review in typed/dictated notes. Please contact Dr Lyn Hollingshead directly for clarity if needed.  Staff may message me via secure chat in Epic  but this may not receive an immediate response,  please page me for urgent matters!  If 7PM-7AM, please contact night coverage [email or web address]

## 2022-08-20 NOTE — Progress Notes (Signed)
OT Cancellation Note  Patient Details Name: Brett Riley MRN: [id number] DOB: January 02, 1936   Cancelled Treatment:    Reason Eval/Treat Not Completed: Medical issues which prohibited therapy Per MD, will hold off this date secondary to low K+ and pending blood transfusion. Will re-attempt next date.   Jackquline Denmark, MS, OTR/L , CBIS ascom [phone]  08/20/22, 2:46 PM

## 2022-08-20 NOTE — Progress Notes (Signed)
Rounding Note    Patient Name: Brett Riley Date of Encounter: 08/20/2022  [facility] Cardiologist: Lorine Bears, MD   Subjective   Patient was seen on a.m. rounds.  Continues to deny any chest pain.  Does endorse shortness of breath that is slightly worse with activity.  Also overnight had some rectal bleeding with his heparin infusion-to be placed on hold.  Hemoglobin this morning 7.7.  Potassium 2.7.  -2.9 liters output in the last 24 hours.  Inpatient Medications    Scheduled Meds:  amiodarone  200 mg Oral BID   aspirin EC  81 mg Oral QHS   Chlorhexidine Gluconate Cloth  6 each Topical Daily   cyanocobalamin  1,000 mcg Oral Daily   diltiazem  420 mg Oral Daily   feeding supplement  237 mL Oral BID BM   furosemide  60 mg Intravenous Q12H   lisinopril  40 mg Oral Daily   metoprolol succinate  100 mg Oral Daily   multivitamins with iron  1 tablet Oral Daily   pantoprazole  40 mg Oral Daily   polyethylene glycol  17 g Oral Daily   potassium chloride  40 mEq Oral Once   rosuvastatin  40 mg Oral Daily   senna  1 tablet Oral Daily   Continuous Infusions:  ceFAZolin Stopped (08/20/22 0542)   PRN Meds: acetaminophen, albuterol, dextromethorphan-guaiFENesin, hydrALAZINE, ondansetron (ZOFRAN) IV, sodium chloride, traMADol   Vital Signs    Vitals:   08/19/22 2300 08/20/22 0321 08/20/22 0330 08/20/22 0740  BP: (!) 128/91 (!) 144/71  (!) 135/101  Pulse: 69 69  72  Resp: 18 18  20   Temp: 98.2 F (36.8 C) 97.9 F (36.6 C)  98.3 F (36.8 C)  TempSrc: Oral Oral    SpO2: 95% (!) 85% 93% 91%  Weight:  90 kg    Height:        Intake/Output Summary (Last 24 hours) at 08/20/2022 1134 Last data filed at 08/20/2022 1030 Gross per 24 hour  Intake 820.86 ml  Output 3750 ml  Net -2929.14 ml      08/20/2022    3:21 AM 08/18/2022    9:15 PM 08/08/2022    3:04 AM  Last 3 Weights  Weight (lbs) 198 lb 6.6 oz 202 lb 11.2 oz 209 lb 14.1 oz  Weight (kg) 90 kg 91.944  kg 95.2 kg      Telemetry    Sinus rhythm with a rate of 60s- Personally Reviewed  ECG    No new tracing- Personally Reviewed  Physical Exam   GEN: No acute distress.   Neck: + JVD Cardiac: RRR, no murmurs, rubs, or gallops.  Respiratory: Coarse with diminished bases, and continued scattered rhonchi to auscultation bilaterally.  Respirations remain unlabored at rest on 5 L of O2 via nasal cannula GI: Soft, nontender, non-distended  MS: 1-2+ edema to BLE; No deformity. Neuro:  Nonfocal  Psych: Normal affect   Labs    High Sensitivity Troponin:   Recent Labs  Lab 08/18/22 1020 08/18/22 1805 08/18/22 2033 08/18/22 2303  TROPONINIHS 258* 1,775* 1,726* 1,446*     Chemistry Recent Labs  Lab 08/18/22 1020 08/18/22 1046 08/19/22 0505 08/20/22 0540  NA 139  --  140 135  K 3.6  --  3.4* 2.7*  CL 106  --  106 99  CO2 22  --  25 24  GLUCOSE 117*  --  100* 96  BUN 31*  --  31* 37*  CREATININE 1.50*  --  1.56* 1.64*  CALCIUM 8.2*  --  7.9* 7.8*  MG  --  2.0  --  1.7  GFRNONAA 46*  --  44* 41*  ANIONGAP 11  --  9 12    Lipids  Recent Labs  Lab 08/18/22 1020  CHOL 84  TRIG 73  HDL 35*  LDLCALC 34  CHOLHDL 2.4    Hematology Recent Labs  Lab 08/18/22 1020 08/19/22 0505 08/20/22 0540  WBC 10.8* 9.3 9.8  RBC 2.83* 2.51* 2.56*  HGB 8.7* 7.6* 7.7*  HCT 27.8* 24.1* 24.0*  MCV 98.2 96.0 93.8  MCH 30.7 30.3 30.1  MCHC 31.3 31.5 32.1  RDW 13.8 13.8 13.8  PLT 282 245 285   Thyroid No results for input(s): "TSH", "FREET4" in the last 168 hours.  BNP Recent Labs  Lab 08/18/22 1020  BNP 279.2*    DDimer No results for input(s): "DDIMER" in the last 168 hours.   Radiology      Cardiac Studies  Limited TTE 08/19/22 1. Left ventricular ejection fraction, by estimation, is 50 to 55%. The  left ventricle has low normal function. Left ventricular endocardial  border not optimally defined to evaluate regional wall motion.   2. Right ventricular systolic  function is normal.   TEE 08/05/22 1. Left ventricular ejection fraction, by estimation, is 55 to 60%. The  left ventricle has normal function.   2. Right ventricular systolic function is normal. The right ventricular  size is normal.   3. No left atrial/left atrial appendage thrombus was detected.   4. The mitral valve is normal in structure. Mild mitral valve  regurgitation.   5. The aortic valve is tricuspid. Aortic valve regurgitation is trivial.    TTE 07/22/22 1. Left ventricular ejection fraction, by estimation, is 55 to 60%. The  left ventricle has normal function. The left ventricle has no regional  wall motion abnormalities. Left ventricular diastolic parameters are  consistent with Grade I diastolic  dysfunction (impaired relaxation).   2. Right ventricular systolic function is normal. The right ventricular  size is normal. There is normal pulmonary artery systolic pressure.   3. The mitral valve is normal in structure. Mild to moderate mitral valve  regurgitation. No evidence of mitral stenosis. Moderate mitral annular  calcification.   4. The aortic valve is normal in structure. Aortic valve regurgitation is  trivial. Aortic valve sclerosis is present, with no evidence of aortic  valve stenosis.   5. The inferior vena cava is normal in size with greater than 50%  respiratory variability, suggesting right atrial pressure of 3 mmHg.   Patient Profile     87 y.o. 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  Assessment & Plan    Acute on chronic HFpEF -Presented with gradual onset shortness of breath over the last several days with worsening peripheral edema -BNP  279.2 -Continued on IV furosemide 60 mg twice daily -Daily BMP while receiving diuretic therapy -Limited echocardiogram revealed LVEF of 50-55% with RV systolic function is normal - -2.5 L output in the last 24 hours with -5.1 L output since admission -Continue with heart failure education -Daily weights, I's and O's, low-sodium diet  NSTEMI versus demand ischemia secondary to heart failure  exacerbation, fever, infection, anemia -High-sensitivity troponins trended and peaked at 1775 -Continues to be in chest pain-free -No ischemic changes noted on EKG -Heparin infusion had to be stopped overnight due to rectal bleeding -Currently remains on aspirin and statin -EKG as needed for pain or changes -No plans currently for invasive evaluation with anemia and heart failure exacerbation  Symptomatic anemia -Hemoglobin 7.7 -Heparin infusion had to be stopped due to rectal bleeding last evening -A-fib with diureses he continues to have low hemoglobin -Would likely benefit from transfusion and GI consult, I have already reached out to Dr. Lyn Hollingshead who is in agreement with the transfusion for today -Daily CBC -Continued management by IM  Hypokalemia -Serum potassium 2.7 -Supplementation ordered twice daily dosing with additional dosing today -Daily BMP -Monitor trend/replete electrolytes as needed -Recommend keeping potassium greater than 4 less than 5  Primary hypertension -Blood pressure 135/101 -Likely component of discomfort and pain -Through the night blood pressures were 100s to 120 systolic -Continued on diltiazem, furosemide, lisinopril, Toprol-XL -Vital signs per unit protocol  Hyperlipidemia -Continued on rosuvastatin  Paroxysmal atrial fibrillation -Currently maintaining sinus rhythm -Continue on amiodarone 200 mg twice daily -Rate control continue with diltiazem and Toprol-XL -Apixaban was placed on hold while he was on heparin infusion, heparin infusion had to be  discontinued due to rectal bleeding, will likely need to go back on some type of blood thinning agent if able for stroke prophylaxis, may benefit from a GI evaluation  -Continue with telemetry monitoring Chronic kidney disease stage IIIa -Serum creatinine 1.64 -Serum creatinine 1.5-1.9 -Daily BMP -Monitor urine output -Monitor/trend/replete electrolytes as needed -Avoid nephrotoxic agents were able  Recent history of MSSA bacteremia with vertebral osteomyelitis -Continued on IV antibiotic therapy -Continued management per IM     For questions or updates, please contact [facility] Please consult [email or web address] for contact info under        Signed, Leighanne Adolph, NP  08/20/2022, 11:34 AM

## 2022-08-20 NOTE — Progress Notes (Signed)
ANTICOAGULATION CONSULT NOTE - Follow-up Consult  Pharmacy Consult for Heparin Infusion Indication: chest pain/ACS  No Known Allergies  Patient Measurements: Height: 6' (182.9 cm) Weight: 91.9 kg (202 lb 11.2 oz) IBW/kg (Calculated) : 77.6 Heparin Dosing Weight: 91.9 kg  Vital Signs: Temp: 98.2 F (36.8 C) (06/17 2300) Temp Source: Oral (06/17 2300) BP: 128/91 (06/17 2300) Pulse Rate: 69 (06/17 2300)  Labs: Recent Labs    08/18/22 1020 08/18/22 1805 08/18/22 2033 08/18/22 2303 08/19/22 0505 08/19/22 1500  HGB 8.7*  --   --   --  7.6*  --   HCT 27.8*  --   --   --  24.1*  --   PLT 282  --   --   --  245  --   APTT  --   --  43*  --  146* 81*  LABPROT  --   --  21.6*  --   --   --   INR  --   --  1.9*  --   --   --   HEPARINUNFRC  --   --  >1.10*  --  >1.10*  --   CREATININE 1.50*  --   --   --  1.56*  --   TROPONINIHS 258* 1,775* 1,726* 1,446*  --   --      Estimated Creatinine Clearance: 38.7 mL/min (A) (by C-G formula based on SCr of 1.56 mg/dL (H)).   Medical History: Past Medical History:  Diagnosis Date   Cancer (HCC)    skin cancer basal   Carotid arterial disease (HCC)    a. 02/2016 L CEA; b. 01/2017 U/S: patent LICA, 1-39% RICA.   Celiac artery stenosis (HCC)    Coronary artery disease    a. 01/2016 MV: mild apical/basal inferior, apical lateral, mid anterolateral, and mid inferolateral ischemia. EF 57%; b. 02/2016 Cath: LM 40ost, LAD 70p/m, 20d, D1 95 (small), LCX nl, RCA 59m, RPDA 90 (small), EF 55-65%-->med Rx. Rec CABG for recurrent symptoms.   Hearing loss    History of echocardiogram    a. 02/2016 Echo: EF 50-55%, no rwma, mild MR.   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 Troponins peaked at 1726. Pharmacy consulted for  Heparin dosing. Patient was taking Apixaban 2.5mg  bid prior to admission for afib, last dose was on 6/15. Heparin held for blood tinged sputum, resumed 6/17 AM. Hemoglobin trending down to 7.6. aPTT at goal. No overt bleeding noted.  Baseline HL: >1.10 6/17 @ 0505:  aPTT = 146,  HL = 1.10 6/17 @ 1500: HL >1.1, aPTT= 81 6/17 @ 2000 :  heparin gtt d/c'd due to blood in stool   Goal of Therapy:  Heparin level 0.3-0.7 units/ml aPTT 66-102 seconds Monitor platelets by anticoagulation protocol: Yes   Plan:  6/18:  heparin gtt placed on hold 6/17 @ 2000 due to bloody stool, cancelled HL and aPTT levels - will need to f/u plans for anticoag with medical staff on 6/18  - - F/u plans to transition to DOAC after 48 hours medical management use aPTT to adjust dose until correlation with HL -Will check daily CBC while on Heparin infusion  Thank you for involving pharmacy in this patient's care.  Carles Florea D 08/20/2022 1:15 AM

## 2022-08-21 ENCOUNTER — Inpatient Hospital Stay: Payer: Medicare HMO

## 2022-08-21 DIAGNOSIS — N1831 Chronic kidney disease, stage 3a: Secondary | ICD-10-CM

## 2022-08-21 DIAGNOSIS — A419 Sepsis, unspecified organism: Secondary | ICD-10-CM

## 2022-08-21 DIAGNOSIS — J9601 Acute respiratory failure with hypoxia: Secondary | ICD-10-CM | POA: Diagnosis not present

## 2022-08-21 DIAGNOSIS — I480 Paroxysmal atrial fibrillation: Secondary | ICD-10-CM | POA: Diagnosis not present

## 2022-08-21 DIAGNOSIS — J189 Pneumonia, unspecified organism: Secondary | ICD-10-CM | POA: Diagnosis not present

## 2022-08-21 LAB — TYPE AND SCREEN
ABO/RH(D): A POS
Antibody Screen: NEGATIVE
Unit division: 0

## 2022-08-21 LAB — CBC
HCT: 27.9 % — ABNORMAL LOW (ref 39.0–52.0)
Hemoglobin: 9.1 g/dL — ABNORMAL LOW (ref 13.0–17.0)
MCH: 30.2 pg (ref 26.0–34.0)
MCHC: 32.6 g/dL (ref 30.0–36.0)
MCV: 92.7 fL (ref 80.0–100.0)
Platelets: 267 10*3/uL (ref 150–400)
RBC: 3.01 MIL/uL — ABNORMAL LOW (ref 4.22–5.81)
RDW: 14.1 % (ref 11.5–15.5)
WBC: 11.9 10*3/uL — ABNORMAL HIGH (ref 4.0–10.5)
nRBC: 0 % (ref 0.0–0.2)

## 2022-08-21 LAB — BASIC METABOLIC PANEL WITH GFR
Anion gap: 12 (ref 5–15)
BUN: 43 mg/dL — ABNORMAL HIGH (ref 8–23)
CO2: 26 mmol/L (ref 22–32)
Calcium: 8 mg/dL — ABNORMAL LOW (ref 8.9–10.3)
Chloride: 102 mmol/L (ref 98–111)
Creatinine, Ser: 1.9 mg/dL — ABNORMAL HIGH (ref 0.61–1.24)
GFR, Estimated: 34 mL/min — ABNORMAL LOW
Glucose, Bld: 115 mg/dL — ABNORMAL HIGH (ref 70–99)
Potassium: 3.3 mmol/L — ABNORMAL LOW (ref 3.5–5.1)
Sodium: 140 mmol/L (ref 135–145)

## 2022-08-21 LAB — CBC WITH DIFFERENTIAL/PLATELET
Abs Immature Granulocytes: 0.12 10*3/uL — ABNORMAL HIGH (ref 0.00–0.07)
Basophils Absolute: 0 10*3/uL (ref 0.0–0.1)
Basophils Relative: 0 %
Eosinophils Absolute: 0 10*3/uL (ref 0.0–0.5)
Eosinophils Relative: 0 %
HCT: 27.9 % — ABNORMAL LOW (ref 39.0–52.0)
Hemoglobin: 9.1 g/dL — ABNORMAL LOW (ref 13.0–17.0)
Immature Granulocytes: 1 %
Lymphocytes Relative: 14 %
Lymphs Abs: 1.6 10*3/uL (ref 0.7–4.0)
MCH: 30.3 pg (ref 26.0–34.0)
MCHC: 32.6 g/dL (ref 30.0–36.0)
MCV: 93 fL (ref 80.0–100.0)
Monocytes Absolute: 0.6 10*3/uL (ref 0.1–1.0)
Monocytes Relative: 5 %
Neutro Abs: 9.6 10*3/uL — ABNORMAL HIGH (ref 1.7–7.7)
Neutrophils Relative %: 80 %
Platelets: 284 10*3/uL (ref 150–400)
RBC: 3 MIL/uL — ABNORMAL LOW (ref 4.22–5.81)
RDW: 14.2 % (ref 11.5–15.5)
WBC: 12 10*3/uL — ABNORMAL HIGH (ref 4.0–10.5)
nRBC: 0 % (ref 0.0–0.2)

## 2022-08-21 LAB — BPAM RBC
Blood Product Expiration Date: 202407162359
ISSUE DATE / TIME: 202406181518
Unit Type and Rh: 6200

## 2022-08-21 LAB — LACTIC ACID, PLASMA
Lactic Acid, Venous: 1.6 mmol/L (ref 0.5–1.9)
Lactic Acid, Venous: 1.7 mmol/L (ref 0.5–1.9)

## 2022-08-21 LAB — BRAIN NATRIURETIC PEPTIDE: B Natriuretic Peptide: 673.3 pg/mL — ABNORMAL HIGH (ref 0.0–100.0)

## 2022-08-21 LAB — PROCALCITONIN: Procalcitonin: 0.28 ng/mL

## 2022-08-21 MED ORDER — METHYLPREDNISOLONE SODIUM SUCC 40 MG IJ SOLR
40.0000 mg | Freq: Once | INTRAMUSCULAR | Status: AC
  Administered 2022-08-21: 40 mg via INTRAVENOUS
  Filled 2022-08-21: qty 1

## 2022-08-21 MED ORDER — PREDNISONE 50 MG PO TABS
50.0000 mg | ORAL_TABLET | Freq: Once | ORAL | Status: DC
  Filled 2022-08-21: qty 1

## 2022-08-21 MED ORDER — SODIUM CHLORIDE 0.9 % IV SOLN
500.0000 mg | Freq: Every day | INTRAVENOUS | Status: DC
  Administered 2022-08-21: 500 mg via INTRAVENOUS
  Filled 2022-08-21 (×2): qty 5

## 2022-08-21 MED ORDER — DILTIAZEM HCL ER COATED BEADS 180 MG PO CP24
420.0000 mg | ORAL_CAPSULE | Freq: Every day | ORAL | Status: DC
  Administered 2022-08-21: 420 mg via ORAL
  Filled 2022-08-21 (×2): qty 2

## 2022-08-21 MED ORDER — POTASSIUM CHLORIDE CRYS ER 20 MEQ PO TBCR
40.0000 meq | EXTENDED_RELEASE_TABLET | Freq: Once | ORAL | Status: AC
  Administered 2022-08-21: 40 meq via ORAL
  Filled 2022-08-21: qty 2

## 2022-08-21 MED ORDER — FUROSEMIDE 10 MG/ML IJ SOLN
40.0000 mg | Freq: Once | INTRAMUSCULAR | Status: AC
  Administered 2022-08-21: 40 mg via INTRAVENOUS
  Filled 2022-08-21: qty 4

## 2022-08-21 NOTE — Progress Notes (Addendum)
PT Cancellation Note  Patient Details Name: MAYSEN KREIKEMEIER MRN: [id number] DOB: February 25, 1936   Cancelled Treatment:    Reason Eval/Treat Not Completed: Other (comment).PT/OT co- Evaluation attempted, however pt leaving room for imaging. Will re-attempt.  2nd attempt: Pt back from imaging, cleared to mobilize per RN. Upon arrival, pt on 10L of HNFC with O2 sats ranging from 78-83% at rest. Pt not appropriate for functional mobility at this time. OT reached out to nursing regarding medical status. Will re-attempt next available date/time when pt medically stable.   Shylin Keizer 08/21/2022, 10:17 AM Elizabeth Palau, PT, DPT, GCS [phone]

## 2022-08-21 NOTE — Progress Notes (Signed)
OT Cancellation Note  Patient Details Name: Brett Riley MRN: [id number] DOB: 07-08-1935   Cancelled Treatment:    Reason Eval/Treat Not Completed: Medical issues which prohibited therapy (Attempted co-eval with SPT, cleared with RN prior to entering, pt on HFNC 10L upon arrival with sats varying from 79-83% without mobility. RN notified via chat and ASCOM. Will re-attempt OT eval as appropriate.)   Muskaan Smet L. Jamorian Dimaria, OTR/L  08/21/22, 11:06 AM

## 2022-08-21 NOTE — Progress Notes (Signed)
       CROSS COVER NOTE  NAME: GAROLD BROERS MRN: [id number] DOB : 1935-10-07    Concern as stated by nurse / staff   Patient requiring continuous BIPAP  Has oral prednisone ordered      Pertinent findings on chart review:   Assessment and  Interventions   Assessment:  Plan: Change to npo Solumedrol 40 mg IV x1 ordered       Donnie Mesa NP [facility] Cross Cover 7pm-7am - check amion for availability Pager [phone]

## 2022-08-21 NOTE — Progress Notes (Addendum)
Pt has a repeat blood culture order. Laboratory staff messaged RN staff nurse and explained that blood culture is good for 5 days and just wanted to confirmed that the order is correct. Pt is also complaining of SOB and not getting enough air on BIPAP. Respiratory made aware and came and asessed pt Respiratory adjust IPAP from 8 to 12. NP Jon Billings made aware. Will continue to monitor.  Update 2113: NP Jon Billings placed order and instructed to proceed with repeat blood culture order, Laboratory staff made aware. Will continue to monitor.  Update 2130: Pt has a scheduled aspirin 81 mg  oral daily. Pharmacy made aware and states will check with NP Morrion. Will continue to monitor.  Update 2155: Per pharmacy, NP Jon Billings instructed to hold aspirin at this time. Will continue to monitor.  Update 0240:  Staff RN was doing round when she stepped at pt room the BIPAP machine was beeping and showed  " HIGH RATE" screen. Pt was asleep at this time. VSS except RR running from 33-47. Initial RR was at 27. Respiratory was called and adjust IPAP from 12 to 10. Pt RR after adjustment is now at 20-23. NP Jon Billings made aware. Will continue to monitor.

## 2022-08-21 NOTE — Progress Notes (Signed)
Ipap increased to 12 as pt c/o "not getting enough air"

## 2022-08-21 NOTE — Progress Notes (Signed)
[facility]  Date: 08/21/22 Time: 5:04 PM  Chest CT findings personally reviewed and discussed with radiology.  Bilateral groundglass opacities noted in both lungs, which could represent pulmonary edema though other process including amiodarone lung toxicity is a consideration.  Moderate bilateral pleural effusions also present.  Patient is currently scheduled for ultrasound-guided thoracentesis.  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  Consultation with pulmonology should be considered tomorrow.  Continue gentle diuresis as renal function allows.  Yvonne Kendall, MD [facility]

## 2022-08-21 NOTE — TOC Progression Note (Signed)
Transition of Care [facility]) - Progression Note    Patient Details  Name: Brett Riley MRN: [id number] Date of Birth: May 02, 1935  Transition of [facility]) CM/SW Contact  Truddie Hidden, RN Phone Number: 08/21/2022, 1:20 PM  Clinical Narrative:    TOC assessing for ongoing needs and discharge planning.        Expected Discharge Plan and Services                                               Social Determinants of Health (SDOH) Interventions SDOH Screenings   Food Insecurity: No Food Insecurity (08/18/2022)  Housing: Low Risk  (08/18/2022)  Transportation Needs: No Transportation Needs (08/18/2022)  Utilities: Not At Risk (08/18/2022)  Depression (PHQ2-9): Low Risk  (06/06/2022)  Financial Resource Strain: Low Risk  (08/29/2020)  Physical Activity: Insufficiently Active (08/29/2020)  Social Connections: Unknown (08/29/2020)  Stress: No Stress Concern Present (08/29/2020)  Tobacco Use: Low Risk  (08/18/2022)    Readmission Risk Interventions     No data to display

## 2022-08-21 NOTE — Plan of Care (Signed)
  Problem: Education: Goal: Knowledge of General Education information will improve Description: Including pain rating scale, medication(s)/side effects and non-pharmacologic comfort measures Outcome: Progressing   Problem: Clinical Measurements: Goal: Will remain free from infection Outcome: Progressing   Problem: Clinical Measurements: Goal: Respiratory complications will improve Outcome: Progressing   Problem: Elimination: Goal: Will not experience complications related to bowel motility Outcome: Progressing   Problem: Elimination: Goal: Will not experience complications related to urinary retention Outcome: Progressing   Problem: Pain Managment: Goal: General experience of comfort will improve Outcome: Progressing   Problem: Safety: Goal: Ability to remain free from injury will improve Outcome: Progressing

## 2022-08-21 NOTE — Progress Notes (Signed)
Rounding Note    Patient Name: Brett Riley Date of Encounter: 08/21/2022  [facility] Cardiologist: Lorine Bears, MD   Subjective   This morning on rounds, wife at the bedside Remains on high flow nasal cannula oxygen 10 L Significant coughing, bloody sputum Remains very short of breath  Inpatient Medications    Scheduled Meds:  amiodarone  200 mg Oral Daily   aspirin EC  81 mg Oral QHS   Chlorhexidine Gluconate Cloth  6 each Topical Daily   cyanocobalamin  1,000 mcg Oral Daily   diltiazem  420 mg Oral Daily   feeding supplement  237 mL Oral BID BM   lisinopril  40 mg Oral Daily   metoprolol succinate  100 mg Oral Daily   multivitamins with iron  1 tablet Oral Daily   pantoprazole  40 mg Oral Daily   polyethylene glycol  17 g Oral Daily   rosuvastatin  40 mg Oral Daily   senna  1 tablet Oral Daily   Continuous Infusions:  ceFAZolin 2 g (08/21/22 0606)   PRN Meds: acetaminophen, albuterol, dextromethorphan-guaiFENesin, hydrALAZINE, ondansetron (ZOFRAN) IV, simethicone, sodium chloride, traMADol   Vital Signs    Vitals:   08/20/22 2303 08/21/22 0331 08/21/22 0437 08/21/22 0854  BP: 130/89 139/70  (!) 160/98  Pulse: 72 66  72  Resp: (!) 22 20  20   Temp: 98.3 F (36.8 C) 98 F (36.7 C)  98 F (36.7 C)  TempSrc: Oral Oral  Oral  SpO2: 94% 90%  92%  Weight:   89.5 kg   Height:        Intake/Output Summary (Last 24 hours) at 08/21/2022 1103 Last data filed at 08/21/2022 0606 Gross per 24 hour  Intake 945.67 ml  Output 1350 ml  Net -404.33 ml      08/21/2022    4:37 AM 08/20/2022    3:21 AM 08/18/2022    9:15 PM  Last 3 Weights  Weight (lbs) 197 lb 5 oz 198 lb 6.6 oz 202 lb 11.2 oz  Weight (kg) 89.5 kg 90 kg 91.944 kg      Telemetry    Normal sinus rhythm- Personally Reviewed  ECG     - Personally Reviewed  Physical Exam   GEN: No acute distress.   Neck: No JVD Cardiac: RRR, no murmurs, rubs, or gallops.  Respiratory: Coarse  breath sounds worse on the left than the right GI: Soft, nontender, non-distended  MS: No edema; No deformity. Neuro:  Nonfocal  Psych: Normal affect   Labs    High Sensitivity Troponin:   Recent Labs  Lab 08/18/22 1020 08/18/22 1805 08/18/22 2033 08/18/22 2303  TROPONINIHS 258* 1,775* 1,726* 1,446*     Chemistry Recent Labs  Lab 08/18/22 1046 08/19/22 0505 08/20/22 0540 08/20/22 1600 08/21/22 0619  NA  --  140 135  --  140  K  --  3.4* 2.7* 3.6 3.3*  CL  --  106 99  --  102  CO2  --  25 24  --  26  GLUCOSE  --  100* 96  --  115*  BUN  --  31* 37*  --  43*  CREATININE  --  1.56* 1.64*  --  1.90*  CALCIUM  --  7.9* 7.8*  --  8.0*  MG 2.0  --  1.7  --   --   GFRNONAA  --  44* 41*  --  34*  ANIONGAP  --  9 12  --  12    Lipids  Recent Labs  Lab 08/18/22 1020  CHOL 84  TRIG 73  HDL 35*  LDLCALC 34  CHOLHDL 2.4    Hematology Recent Labs  Lab 08/19/22 0505 08/20/22 0540 08/20/22 1830 08/21/22 0619  WBC 9.3 9.8  --  12.0*  11.9*  RBC 2.51* 2.56*  --  3.00*  3.01*  HGB 7.6* 7.7* 9.0* 9.1*  9.1*  HCT 24.1* 24.0* 27.5* 27.9*  27.9*  MCV 96.0 93.8  --  93.0  92.7  MCH 30.3 30.1  --  30.3  30.2  MCHC 31.5 32.1  --  32.6  32.6  RDW 13.8 13.8  --  14.2  14.1  PLT 245 285  --  284  267   Thyroid No results for input(s): "TSH", "FREET4" in the last 168 hours.  BNP Recent Labs  Lab 08/18/22 1020  BNP 279.2*    DDimer No results for input(s): "DDIMER" in the last 168 hours.   Radiology    ECHOCARDIOGRAM LIMITED  Result Date: 08/19/2022    ECHOCARDIOGRAM LIMITED REPORT   Patient Name:   Brett Riley Date of Exam: 08/19/2022 Medical Rec #:  [id number]      Height:       72.0 in Accession #:    [id number]     Weight:       202.7 lb Date of Birth:  03-04-1936     BSA:          2.143 m Patient Age:    86 years       BP:           124/62 mmHg Patient Gender: M              HR:           66 bpm. Exam Location:  [facility] Procedure: Limited Echo Indications:      Dyspnea  History:         Patient has prior history of Echocardiogram examinations, most                  recent 08/05/2022. CHF, CAD, Arrythmias:Atrial Fibrillation,                  Signs/Symptoms:Dyspnea, Dizziness/Lightheadedness, Fatigue and                  Bacteremia; Risk Factors:Hypertension and Dyslipidemia.  Sonographer:     Mikki Harbor Referring Phys:  [id number] SHERI HAMMOCK Diagnosing Phys: Yvonne Kendall MD  Sonographer Comments: Image acquisition challenging due to respiratory motion. IMPRESSIONS  1. Left ventricular ejection fraction, by estimation, is 50 to 55%. The left ventricle has low normal function. Left ventricular endocardial border not optimally defined to evaluate regional wall motion.  2. Right ventricular systolic function is normal. FINDINGS  Left Ventricle: Left ventricular ejection fraction, by estimation, is 50 to 55%. The left ventricle has low normal function. Left ventricular endocardial border not optimally defined to evaluate regional wall motion. The left ventricular internal cavity  size was normal in size. There is no left ventricular hypertrophy. Right Ventricle: No increase in right ventricular wall thickness. Right ventricular systolic function is normal. Mitral Valve: Mild mitral annular calcification. Aorta: The aortic root is normal in size and structure. LEFT VENTRICLE PLAX 2D LVIDd:         4.90 cm LVIDs:         3.50 cm LV PW:         1.00 cm LV  IVS:        0.90 cm LVOT diam:     2.10 cm LVOT Area:     3.46 cm  LV Volumes (MOD) LV vol d, MOD A2C: 84.0 ml LV vol d, MOD A4C: 106.0 ml LV vol s, MOD A2C: 52.7 ml LV vol s, MOD A4C: 48.7 ml LV SV MOD A2C:     31.3 ml LV SV MOD A4C:     106.0 ml LV SV MOD BP:      48.5 ml LEFT ATRIUM         Index LA diam:    3.70 cm 1.73 cm/m   AORTA Ao Root diam: 3.80 cm  SHUNTS Systemic Diam: 2.10 cm Yvonne Kendall MD Electronically signed by Yvonne Kendall MD Signature Date/Time: 08/19/2022/4:03:59 PM    Final     Cardiac  Studies   TEE 08/05/22 1. Left ventricular ejection fraction, by estimation, is 55 to 60%. The  left ventricle has normal function.   2. Right ventricular systolic function is normal. The right ventricular  size is normal.   3. No left atrial/left atrial appendage thrombus was detected.   4. The mitral valve is normal in structure. Mild mitral valve  regurgitation.   5. The aortic valve is tricuspid. Aortic valve regurgitation is trivial.   Limited TTE 08/19/22 1. Left ventricular ejection fraction, by estimation, is 50 to 55%. The  left ventricle has low normal function. Left ventricular endocardial  border not optimally defined to evaluate regional wall motion.   2. Right ventricular systolic function is normal.   Patient Profile     87 y.o. 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   Assessment & Plan    Acute respiratory failure Concern for pneumonia, possibly multi lobar, consider chest CT scan and broadening antibiotics if indicated  Acute on chronic HFpEF With shortness of breath on admission -Limited echocardiogram revealed LVEF of 50-55% with RV systolic function is normal -Treated with IV Lasix, now with significant climbing creatinine, no dramatic improvement in respiratory status, -5 L -Lasix on hold, liberalize p.o. fluid intake given creatinine 1.9   3. NSTEMI versus demand ischemia  Likely supply/demand mismatch in the setting of pneumonia ,  fever, infection, anemia -High-sensitivity troponins  peaked at 1775 -Denies significant symptoms concerning for ischemia, no angina -No ischemic changes noted on EKG -Heparin infusion had to be stopped overnight due to rectal  bleeding -No plans currently for invasive evaluation with anemia and heart failure exacerbation   4.Symptomatic anemia -Hemoglobin 7.7, received transfusion now greater than 9 -Heparin infusion  stopped due to rectal bleeding    5Hypokalemia -Serum potassium 3.3 -Supplementation : additional dosing today   6Primary hypertension -Continued on diltiazem, furosemide, lisinopril, Toprol-XL   7Hyperlipidemia -Continued on rosuvastatin   8Paroxysmal atrial fibrillation -Currently maintaining sinus rhythm -Continue on amiodarone 200 mg twice daily diltiazem and Toprol-XL -Apixaban was placed on hold while he was on heparin infusion, heparin infusion had to be discontinued due to rectal bleeding   9Chronic kidney disease stage IIIa -Serum creatinine 1.9 Lasix held, liberalize fluid intake   10Recent history of MSSA bacteremia with vertebral osteomyelitis -Continued on IV antibiotic therapy -Continued management per IM   Total encounter time more than 50 minutes  Greater than 50% was spent in counseling and coordination of care with the patient   For questions or updates, please contact [facility] Please consult [email or web address] for contact info under        Signed, Julien Nordmann, MD  08/21/2022, 11:03 AM

## 2022-08-21 NOTE — Care Management Important Message (Signed)
Important Message  Patient Details  Name: Brett Riley MRN: [id number] Date of Birth: 1935-03-22   Medicare Important Message Given:  N/A - LOS <3 / Initial given by admissions     [name] 08/21/2022, 8:24 AM

## 2022-08-21 NOTE — Progress Notes (Addendum)
PROGRESS NOTE    Brett Riley   [name] DOB: September 12, 1935  DOA: 08/18/2022 Date of Service: 08/21/22 PCP: Dale Long, MD     Brief Narrative / Hospital Course:  Brett Riley is a 87 y.o. male with medical history significant of dCHF, HTN, HLD, CAD, CKD-3a, anemia, A fib on Eliquis, who presents 08/18/2022 to ED from rehab, chief complaint SOB.  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 Net IO Since Admission: -5,549.14 mL [08/20/22 1608]. Heparin stopped d/t rectal bleeding overnight.  06/19: euvolemic per cardio, d/c lasix, O2 requirement still high, CT chest reviewed, ?edema/atypical infection, given clinically worsening respiratory status have asked pulmonary and ID to consult. Have d/c amiodarone and ordered steroids in case amio toxicity. Hypoxic, somnolent, so placed back on BiPap. Added lasix tonight and started azithromycin. Long discussion w/ family - PT AND FAMILY AGREE FOR DO NOT INTUBATE.   Consultants:  Cardiology Pulmonology    Procedures: none      ASSESSMENT & PLAN:   Principal Problem:   Acute respiratory failure with hypoxia (HCC) Active Problems:   Acute on chronic diastolic CHF (congestive heart failure) (HCC)   MSSA bacteremia   Vertebral osteomyelitis (HCC)   CAD (coronary artery disease)   Myocardial injury   Hypertension   Hyperlipidemia   Chronic kidney disease, stage 3a (HCC)   Normocytic anemia   Atrial fibrillation, chronic (HCC)  Acute on chronic respiratory failure with hypoxia due to acute on chronic diastolic CHF  (congestive heart failure), complicated by potential pneumonia, pleural effusion  See below for individual A/P BiPap  CHF 2D echo on 07/21/21 showed  EF of 55-60% with grade 1 diastolic dysfunction Limited echocardiogram revealed LVEF of 50-55% with RV systolic function is normal  CXR questionable for PNA but did not seem clinically to have PNA and procalcitonon <0.10 so abx for PNA were held early in admission and treatment focused on CHF Lasix per cardiology  Repeat limited echo to eval new systolic dysfunction --> no significant worsening form previous echo  Daily weights, strict I/O's, Low salt diet, Fluid restriction Per cardiology fluid status seem improved but respiratory status is not better and is worsening, will get CT chest - see below Ordered additional lasix dose this evening   Abnormal CT chest Concerning for pulmonary edema vs atypical infection Will get procalcitonin and BNP to help differentiate CHF vs infection but will continue treating w/ diuresis and will add abx pending pulmonary and ID eval (secure chatted w/ Dr Karna Christmas and Dr Rivka Safer 08/21/22 who will see patient tomorrow) Bipap  Repeat Lasix as above restarted abx 08/21/22 w/ borderline elevated procalcitonin and concern for atypical infectino on CT Stopped amiodarone in case of lung toxicity, gave prednisone Repeat BCx  Pleural effusion Thoracentesis   Ascites on exam Paracentesis    Atrial fibrillation, chronic (HCC): Heart rate 97 ASA, diltiazem, lisinopril, metoprolol per cardiology Hold Eliquis given anemia  Stopped amiodarone in case of lung toxicity, gave prednisone  Recent hx of MSSA bacteremia vertebral osteomyelitis (HCC): continue IV Ancef  repeat Blood culture NGx2d   CAD (coronary artery disease)  and Myocardial injury:  trop  258, possibly due to demand ischemia secondary to CHF exacerbation.  Patient does not have chest pain. Trend troponin --> 1775 and heparin gtt started -->  1446 Heparin gtt held d/t anemia  Crestor, beta blocker, Aspirin 81 mg daily  Cardiology following  May need coronary angiography at some point but will defer for now     Hypertension IV hydralazine as needed Other cardiac meds as above    Hyperlipidemia Crestor   Hypokalemia Replace as needed Monitor BMP  GERD PPI  Chronic kidney disease, stage 3a (HCC):  Renal function stable.  Baseline creatinine 1.5-1.9 Follow BMP Avoiding fluids d/t CHF   Normocytic anemia:  ABLA w/ rectal bleed S/p 1 unit PRBC 06/18 Follow CBC  Leukocytosis  Potentially reactive vs infectious  Follow CBC, adding differential  Obtain procal, repeat Bcx     DVT prophylaxis: on heparin gtt  Pertinent IV fluids/nutrition: no continuous IV fluids  Central lines / invasive devices: PICC in R Brachial    Code Status: DNR, see H&P, discuss w/ pt/family if respiratory deterioration requiring intubation, no CPR ACP documentation reviewed: 08/19/22 - HCPOA w/ 1) Dava Najjar, alternates 2) Erling Conte, 3) Sandra Cockayne FIelds.   Current Admission Status: inpatient  TOC needs / Dispo plan: TBD Barriers to discharge / significant pending items: cardiology recs, CHF tx diuresing, needing blood transfusion today               Subjective / Brief ROS:  Patient reports fatigue and feeling significantly SOB not much better from yesterday  No further rectal bleeding  Pain controlled.  Denies new weakness.  Tolerating diet  Family Communication: wife and daughter at bedside on rounds     Objective Findings:  Vitals:   08/21/22 0437 08/21/22 0854 08/21/22 1228 08/21/22 1700  BP:  (!) 160/98 (!) 123/97 (!) 132/92  Pulse:  72 71 67  Resp:  20 18   Temp:  98 F (36.7 C) 98 F (36.7 C) 97.7 F (36.5 C)  TempSrc:  Oral  Oral  SpO2:  92% 95% 95%  Weight: 89.5 kg     Height:        Intake/Output Summary (Last 24 hours) at 08/21/2022 1820 Last data filed at 08/21/2022 1500 Gross per  24 hour  Intake 829.89 ml  Output 1350 ml  Net -520.11 ml   Filed Weights   08/18/22 2115 08/20/22 0321 08/21/22 0437  Weight: 91.9 kg 90 kg 89.5 kg    Examination:  Physical Exam Constitutional:      General: He is not in acute distress. Cardiovascular:     Rate and Rhythm: Normal rate and regular rhythm.     Heart sounds: Normal heart sounds.  Pulmonary:     Effort: Pulmonary effort is normal.     Breath sounds: Rales present.  Abdominal:     Palpations: Abdomen is soft.  Musculoskeletal:     Right lower leg: Edema present.     Left lower leg: Edema present.  Neurological:     General: No focal deficit present.     Mental Status: He is alert. Mental status is at baseline.  Psychiatric:        Mood and Affect: Mood normal.        Behavior: Behavior normal.          Scheduled Medications:   aspirin EC  81 mg Oral QHS   Chlorhexidine Gluconate Cloth  6 each Topical Daily  cyanocobalamin  1,000 mcg Oral Daily   diltiazem  420 mg Oral Daily   feeding supplement  237 mL Oral BID BM   furosemide  40 mg Intravenous Once   lisinopril  40 mg Oral Daily   metoprolol succinate  100 mg Oral Daily   multivitamins with iron  1 tablet Oral Daily   pantoprazole  40 mg Oral Daily   polyethylene glycol  17 g Oral Daily   predniSONE  50 mg Oral Once   rosuvastatin  40 mg Oral Daily   senna  1 tablet Oral Daily    Continuous Infusions:  azithromycin     ceFAZolin 2 g (08/21/22 1421)    PRN Medications:  acetaminophen, albuterol, dextromethorphan-guaiFENesin, hydrALAZINE, ondansetron (ZOFRAN) IV, simethicone, sodium chloride, traMADol  Antimicrobials from admission:  Anti-infectives (From admission, onward)    Start     Dose/Rate Route Frequency Ordered Stop   08/21/22 1815  azithromycin (ZITHROMAX) 500 mg in sodium chloride 0.9 % 250 mL IVPB        500 mg 250 mL/hr over 60 Minutes Intravenous Daily-1800 08/21/22 1807     08/18/22 1400  ceFAZolin (ANCEF) IVPB  2g/100 mL premix       Note to Pharmacy: Indication:  Vertebral osteomyelitis  First Dose: Yes Last Day of Therapy:  09/13/22  Labs - Once weekly (Monday):  CBC/D and CMP, Labs - Once weekly (Monday): ESR and CRP Fax weekly lab results  promptly to [phone] Method of administration: IV Push- Please pull PICC at completion of IV   2 g 200 mL/hr over 30 Minutes Intravenous Every 8 hours 08/18/22 1319 09/13/22 2359   08/18/22 1115  vancomycin (VANCOCIN) IVPB 1000 mg/200 mL premix        1,000 mg 200 mL/hr over 60 Minutes Intravenous  Once 08/18/22 1107 08/18/22 1322   08/18/22 1115  ceFEPIme (MAXIPIME) 2 g in sodium chloride 0.9 % 100 mL IVPB        2 g 200 mL/hr over 30 Minutes Intravenous  Once 08/18/22 1107 08/18/22 1211           Data Reviewed:  I have personally reviewed the following...  CBC: Recent Labs  Lab 08/18/22 1020 08/19/22 0505 08/20/22 0540 08/20/22 1830 08/21/22 0619  WBC 10.8* 9.3 9.8  --  12.0*  11.9*  NEUTROABS 8.9*  --   --   --  9.6*  HGB 8.7* 7.6* 7.7* 9.0* 9.1*  9.1*  HCT 27.8* 24.1* 24.0* 27.5* 27.9*  27.9*  MCV 98.2 96.0 93.8  --  93.0  92.7  PLT 282 245 285  --  284  267   Basic Metabolic Panel: Recent Labs  Lab 08/18/22 1020 08/18/22 1046 08/19/22 0505 08/20/22 0540 08/20/22 1600 08/21/22 0619  NA 139  --  140 135  --  140  K 3.6  --  3.4* 2.7* 3.6 3.3*  CL 106  --  106 99  --  102  CO2 22  --  25 24  --  26  GLUCOSE 117*  --  100* 96  --  115*  BUN 31*  --  31* 37*  --  43*  CREATININE 1.50*  --  1.56* 1.64*  --  1.90*  CALCIUM 8.2*  --  7.9* 7.8*  --  8.0*  MG  --  2.0  --  1.7  --   --    GFR: Estimated Creatinine Clearance: 31.8 mL/min (A) (by C-G formula based on SCr of  1.9 mg/dL (H)). Liver Function Tests: No results for input(s): "AST", "ALT", "ALKPHOS", "BILITOT", "PROT", "ALBUMIN" in the last 168 hours. No results for input(s): "LIPASE", "AMYLASE" in the last 168 hours. No results for input(s): "AMMONIA" in  the last 168 hours. Coagulation Profile: Recent Labs  Lab 08/18/22 2033  INR 1.9*   Cardiac Enzymes: No results for input(s): "CKTOTAL", "CKMB", "CKMBINDEX", "TROPONINI" in the last 168 hours. BNP (last 3 results) No results for input(s): "PROBNP" in the last 8760 hours. HbA1C: Recent Labs    08/18/22 2040  HGBA1C 5.4   CBG: Recent Labs  Lab 08/20/22 0508  GLUCAP 89   Lipid Profile: No results for input(s): "CHOL", "HDL", "LDLCALC", "TRIG", "CHOLHDL", "LDLDIRECT" in the last 72 hours.  Thyroid Function Tests: No results for input(s): "TSH", "T4TOTAL", "FREET4", "T3FREE", "THYROIDAB" in the last 72 hours. Anemia Panel: No results for input(s): "VITAMINB12", "FOLATE", "FERRITIN", "TIBC", "IRON", "RETICCTPCT" in the last 72 hours. Most Recent Urinalysis On File:     Component Value Date/Time   COLORURINE AMBER (A) 08/01/2022 1155   APPEARANCEUR CLOUDY (A) 08/01/2022 1155   LABSPEC 1.013 08/01/2022 1155   PHURINE 5.0 08/01/2022 1155   GLUCOSEU NEGATIVE 08/01/2022 1155   HGBUR LARGE (A) 08/01/2022 1155   BILIRUBINUR NEGATIVE 08/01/2022 1155   KETONESUR NEGATIVE 08/01/2022 1155   PROTEINUR 100 (A) 08/01/2022 1155   NITRITE NEGATIVE 08/01/2022 1155   LEUKOCYTESUR NEGATIVE 08/01/2022 1155   Sepsis Labs: @LABRCNTIP (procalcitonin:4,lacticidven:4) Microbiology: Recent Results (from the past 240 hour(s))  Blood culture (routine x 2)     Status: None (Preliminary result)   Collection Time: 08/18/22 10:45 AM   Specimen: BLOOD LEFT ARM  Result Value Ref Range Status   Specimen Description   Final    BLOOD LEFT ARM Performed at [facility], [address], [place], [place] [address]    Special Requests   Final    BOTTLES DRAWN AEROBIC AND ANAEROBIC Blood Culture adequate volume Performed at [facility], [address], [place], [place] [address]    Culture   Final    NO GROWTH 3 DAYS Performed at [facility], [address]  [place], [place] [address]    Report Status PENDING  Incomplete  Resp Panel by RT-PCR (Flu A&B, Covid) Anterior Nasal Swab     Status: None   Collection Time: 08/18/22 10:46 AM   Specimen: Anterior Nasal Swab  Result Value Ref Range Status   SARS Coronavirus 2 by RT PCR NEGATIVE NEGATIVE Final    Comment: (NOTE) SARS-CoV-2 target nucleic acids are NOT DETECTED.  The SARS-CoV-2 RNA is generally detectable in upper respiratory specimens during the acute phase of infection. The lowest concentration of SARS-CoV-2 viral copies this assay can detect is 138 copies/mL. A negative result does not preclude SARS-Cov-2 infection and should not be used as the sole basis for treatment or other patient management decisions. A negative result may occur with  improper specimen collection/handling, submission of specimen other than nasopharyngeal swab, presence of viral mutation(s) within the areas targeted by this assay, and inadequate number of viral copies(<138 copies/mL). A negative result must be combined with clinical observations, patient history, and epidemiological information. The expected result is Negative.  Fact Sheet for Patients:  BloggerCourse.com  Fact Sheet for Healthcare Providers:  SeriousBroker.it  This test is no t yet approved or cleared by the [place] FDA and  has been authorized for detection and/or diagnosis of SARS-CoV-2 by FDA under an Emergency Use Authorization (EUA).  This EUA will remain  in effect (meaning this test can be used) for the duration of the COVID-19 declaration under Section 564(b)(1) of the Act, 21 U.S.C.section 360bbb-3(b)(1), unless the authorization is terminated  or revoked sooner.       Influenza A by PCR NEGATIVE NEGATIVE Final   Influenza B by PCR NEGATIVE NEGATIVE Final    Comment: (NOTE) The Xpert Xpress SARS-CoV-2/FLU/RSV plus assay is intended as an aid in the diagnosis of influenza from  Nasopharyngeal swab specimens and should not be used as a sole basis for treatment. Nasal washings and aspirates are unacceptable for Xpert Xpress SARS-CoV-2/FLU/RSV testing.  Fact Sheet for Patients: BloggerCourse.com  Fact Sheet for Healthcare Providers: SeriousBroker.it  This test is not yet approved or cleared by the [place] FDA and has been authorized for detection and/or diagnosis of SARS-CoV-2 by FDA under an Emergency Use Authorization (EUA). This EUA will remain in effect (meaning this test can be used) for the duration of the COVID-19 declaration under Section 564(b)(1) of the Act, 21 U.S.C. section 360bbb-3(b)(1), unless the authorization is terminated or revoked.  Performed at [facility], [address] [place], [place] [address]   Culture, blood (Routine X 2) w Reflex to ID Panel     Status: None (Preliminary result)   Collection Time: 08/18/22  9:19 PM   Specimen: BLOOD LEFT ARM  Result Value Ref Range Status   Specimen Description   Final    BLOOD LEFT ARM Performed at [facility], [address] [place], [place] [address]    Special Requests   Final    BOTTLES DRAWN AEROBIC AND ANAEROBIC Blood Culture adequate volume Performed at [facility], [address] [place], [place] [address]    Culture   Final    NO GROWTH 3 DAYS Performed at [facility], [address] [place], [place] [address]    Report Status PENDING  Incomplete      Radiology Studies last 3 days: CT CHEST WO CONTRAST  Result Date: 08/21/2022 CLINICAL DATA:  Dyspnea. EXAM: CT CHEST WITHOUT CONTRAST TECHNIQUE: Multidetector CT imaging of the chest was performed following the standard protocol without IV contrast. RADIATION DOSE REDUCTION: This exam was performed according to the departmental dose-optimization program which includes automated exposure control, adjustment of the mA and/or kV  according to patient size and/or use of iterative reconstruction technique. COMPARISON:  August 18, 2022. FINDINGS: Cardiovascular: Atherosclerosis of thoracic aorta is noted. Probable penetrating atherosclerotic ulcer or focal saccular aneurysm measuring 3.0 x 1.3 cm is seen involving posterior aspect of distal portion of ascending thoracic aorta. Normal cardiac size. No pericardial effusion. Extensive coronary artery calcifications are noted. Mediastinum/Nodes: No enlarged mediastinal or axillary lymph nodes. Thyroid gland, trachea, and esophagus demonstrate no significant findings. Lungs/Pleura: Moderate size bilateral pleural effusions are noted. No pneumothorax is noted. Extensive bilateral upper and lower lobe interstitial opacities are noted most consistent with pulmonary edema or atypical infection. Upper Abdomen: No acute abnormality. Musculoskeletal: No chest wall mass or suspicious bone lesions identified. IMPRESSION: Extensive bilateral upper and lower lobe interstitial opacities are noted most consistent with pulmonary edema or atypical infection. Moderate size bilateral pleural effusions are noted as well. 3.0 x 1.3 cm penetrating atherosclerotic ulcer or focal saccular aneurysm is seen involving distal portion of ascending thoracic aortic aneurysm. Extensive coronary artery calcifications are noted suggesting coronary artery disease. Aortic Atherosclerosis (ICD10-I70.0). Electronically Signed   By: Lupita Raider M.D.   On: 08/21/2022  14:21   ECHOCARDIOGRAM LIMITED  Result Date: 08/19/2022    ECHOCARDIOGRAM LIMITED REPORT   Patient Name:   ROXY KEMME Date of Exam: 08/19/2022 Medical Rec #:  [id number]      Height:       72.0 in Accession #:    [id number]     Weight:       202.7 lb Date of Birth:  06-07-1935     BSA:          2.143 m Patient Age:    86 years       BP:           124/62 mmHg Patient Gender: M              HR:           66 bpm. Exam Location:  [facility] Procedure: Limited Echo  Indications:     Dyspnea  History:         Patient has prior history of Echocardiogram examinations, most                  recent 08/05/2022. CHF, CAD, Arrythmias:Atrial Fibrillation,                  Signs/Symptoms:Dyspnea, Dizziness/Lightheadedness, Fatigue and                  Bacteremia; Risk Factors:Hypertension and Dyslipidemia.  Sonographer:     Mikki Harbor Referring Phys:  [id number] SHERI HAMMOCK Diagnosing Phys: Yvonne Kendall MD  Sonographer Comments: Image acquisition challenging due to respiratory motion. IMPRESSIONS  1. Left ventricular ejection fraction, by estimation, is 50 to 55%. The left ventricle has low normal function. Left ventricular endocardial border not optimally defined to evaluate regional wall motion.  2. Right ventricular systolic function is normal. FINDINGS  Left Ventricle: Left ventricular ejection fraction, by estimation, is 50 to 55%. The left ventricle has low normal function. Left ventricular endocardial border not optimally defined to evaluate regional wall motion. The left ventricular internal cavity  size was normal in size. There is no left ventricular hypertrophy. Right Ventricle: No increase in right ventricular wall thickness. Right ventricular systolic function is normal. Mitral Valve: Mild mitral annular calcification. Aorta: The aortic root is normal in size and structure. LEFT VENTRICLE PLAX 2D LVIDd:         4.90 cm LVIDs:         3.50 cm LV PW:         1.00 cm LV IVS:        0.90 cm LVOT diam:     2.10 cm LVOT Area:     3.46 cm  LV Volumes (MOD) LV vol d, MOD A2C: 84.0 ml LV vol d, MOD A4C: 106.0 ml LV vol s, MOD A2C: 52.7 ml LV vol s, MOD A4C: 48.7 ml LV SV MOD A2C:     31.3 ml LV SV MOD A4C:     106.0 ml LV SV MOD BP:      48.5 ml LEFT ATRIUM         Index LA diam:    3.70 cm 1.73 cm/m   AORTA Ao Root diam: 3.80 cm  SHUNTS Systemic Diam: 2.10 cm Yvonne Kendall MD Electronically signed by Yvonne Kendall MD Signature Date/Time: 08/19/2022/4:03:59 PM    Final     DG Chest Port 1 View  Result Date: 08/18/2022 CLINICAL DATA:  Dyspnea EXAM: PORTABLE CHEST 1 VIEW COMPARISON:  08/01/2022 FINDINGS: Interval placement of right-sided PICC  line with distal tip terminating at the level of the distal SVC. Stable heart size. Aortic atherosclerosis. Bilateral interstitial opacities, most confluent within the right upper lobe. Possible trace bilateral pleural effusions. No pneumothorax. IMPRESSION: Bilateral interstitial opacities, most confluent within the right upper lobe. Findings may reflect pulmonary edema versus atypical/viral infection. Electronically Signed   By: Duanne Guess D.O.   On: 08/18/2022 10:38             LOS: 3 days   Total time spent 50 mins Additional 35 min critical care time this evening spent    Sunnie Nielsen, DO [facility] 08/21/2022, 6:20 PM    Dictation software may have been used to generate the above note. Typos may occur and escape review in typed/dictated notes. Please contact Dr Lyn Hollingshead directly for clarity if needed.  Staff may message me via secure chat in Epic  but this may not receive an immediate response,  please page me for urgent matters!  If 7PM-7AM, please contact night coverage [email or web address]

## 2022-08-21 NOTE — Plan of Care (Signed)
Patient AOX4, VSS throughout shift.  All meds given on time as ordered.  Purewick in place.  POC maintained, will continue to monitor.  Problem: Education: Goal: Ability to demonstrate management of disease process will improve Outcome: Progressing Goal: Ability to verbalize understanding of medication therapies will improve Outcome: Progressing Goal: Individualized Educational Video(s) Outcome: Progressing   Problem: Activity: Goal: Capacity to carry out activities will improve Outcome: Progressing   Problem: Cardiac: Goal: Ability to achieve and maintain adequate cardiopulmonary perfusion will improve Outcome: Progressing   Problem: Activity: Goal: Ability to tolerate increased activity will improve Outcome: Progressing   Problem: Clinical Measurements: Goal: Ability to maintain a body temperature in the normal range will improve Outcome: Progressing   Problem: Respiratory: Goal: Ability to maintain adequate ventilation will improve Outcome: Progressing Goal: Ability to maintain a clear airway will improve Outcome: Progressing   Problem: Education: Goal: Knowledge of General Education information will improve Description: Including pain rating scale, medication(s)/side effects and non-pharmacologic comfort measures Outcome: Progressing   Problem: Health Behavior/Discharge Planning: Goal: Ability to manage health-related needs will improve Outcome: Progressing   Problem: Clinical Measurements: Goal: Ability to maintain clinical measurements within normal limits will improve Outcome: Progressing Goal: Will remain free from infection Outcome: Progressing Goal: Diagnostic test results will improve Outcome: Progressing Goal: Respiratory complications will improve Outcome: Progressing Goal: Cardiovascular complication will be avoided Outcome: Progressing   Problem: Activity: Goal: Risk for activity intolerance will decrease Outcome: Progressing   Problem:  Nutrition: Goal: Adequate nutrition will be maintained Outcome: Progressing   Problem: Coping: Goal: Level of anxiety will decrease Outcome: Progressing   Problem: Elimination: Goal: Will not experience complications related to bowel motility Outcome: Progressing Goal: Will not experience complications related to urinary retention Outcome: Progressing   Problem: Pain Managment: Goal: General experience of comfort will improve Outcome: Progressing   Problem: Safety: Goal: Ability to remain free from injury will improve Outcome: Progressing   Problem: Skin Integrity: Goal: Risk for impaired skin integrity will decrease Outcome: Progressing

## 2022-08-22 ENCOUNTER — Inpatient Hospital Stay: Payer: Medicare HMO

## 2022-08-22 DIAGNOSIS — I482 Chronic atrial fibrillation, unspecified: Secondary | ICD-10-CM | POA: Diagnosis not present

## 2022-08-22 DIAGNOSIS — I480 Paroxysmal atrial fibrillation: Secondary | ICD-10-CM | POA: Diagnosis not present

## 2022-08-22 DIAGNOSIS — A419 Sepsis, unspecified organism: Secondary | ICD-10-CM | POA: Diagnosis not present

## 2022-08-22 DIAGNOSIS — J189 Pneumonia, unspecified organism: Secondary | ICD-10-CM | POA: Diagnosis not present

## 2022-08-22 DIAGNOSIS — N1831 Chronic kidney disease, stage 3a: Secondary | ICD-10-CM | POA: Diagnosis not present

## 2022-08-22 DIAGNOSIS — I25118 Atherosclerotic heart disease of native coronary artery with other forms of angina pectoris: Secondary | ICD-10-CM

## 2022-08-22 DIAGNOSIS — I5033 Acute on chronic diastolic (congestive) heart failure: Secondary | ICD-10-CM | POA: Diagnosis not present

## 2022-08-22 DIAGNOSIS — J9601 Acute respiratory failure with hypoxia: Secondary | ICD-10-CM | POA: Diagnosis not present

## 2022-08-22 LAB — BASIC METABOLIC PANEL WITH GFR
Anion gap: 9 (ref 5–15)
BUN: 55 mg/dL — ABNORMAL HIGH (ref 8–23)
CO2: 27 mmol/L (ref 22–32)
Calcium: 8 mg/dL — ABNORMAL LOW (ref 8.9–10.3)
Chloride: 105 mmol/L (ref 98–111)
Creatinine, Ser: 2.28 mg/dL — ABNORMAL HIGH (ref 0.61–1.24)
GFR, Estimated: 28 mL/min — ABNORMAL LOW
Glucose, Bld: 139 mg/dL — ABNORMAL HIGH (ref 70–99)
Potassium: 3.7 mmol/L (ref 3.5–5.1)
Sodium: 141 mmol/L (ref 135–145)

## 2022-08-22 LAB — CBC WITH DIFFERENTIAL/PLATELET
Abs Immature Granulocytes: 0.12 10*3/uL — ABNORMAL HIGH (ref 0.00–0.07)
Basophils Absolute: 0 10*3/uL (ref 0.0–0.1)
Basophils Relative: 0 %
Eosinophils Absolute: 0 10*3/uL (ref 0.0–0.5)
Eosinophils Relative: 0 %
HCT: 25.3 % — ABNORMAL LOW (ref 39.0–52.0)
Hemoglobin: 8 g/dL — ABNORMAL LOW (ref 13.0–17.0)
Immature Granulocytes: 1 %
Lymphocytes Relative: 8 %
Lymphs Abs: 0.9 10*3/uL (ref 0.7–4.0)
MCH: 29.7 pg (ref 26.0–34.0)
MCHC: 31.6 g/dL (ref 30.0–36.0)
MCV: 94.1 fL (ref 80.0–100.0)
Monocytes Absolute: 0.1 10*3/uL (ref 0.1–1.0)
Monocytes Relative: 1 %
Neutro Abs: 9.3 10*3/uL — ABNORMAL HIGH (ref 1.7–7.7)
Neutrophils Relative %: 90 %
Platelets: 241 10*3/uL (ref 150–400)
RBC: 2.69 MIL/uL — ABNORMAL LOW (ref 4.22–5.81)
RDW: 14.2 % (ref 11.5–15.5)
WBC: 10.4 10*3/uL (ref 4.0–10.5)
nRBC: 0 % (ref 0.0–0.2)

## 2022-08-22 LAB — EXPECTORATED SPUTUM ASSESSMENT W GRAM STAIN, RFLX TO RESP C

## 2022-08-22 LAB — C-REACTIVE PROTEIN: CRP: 17.6 mg/dL — ABNORMAL HIGH

## 2022-08-22 LAB — LACTATE DEHYDROGENASE: LDH: 301 U/L — ABNORMAL HIGH (ref 98–192)

## 2022-08-22 LAB — SEDIMENTATION RATE: Sed Rate: 126 mm/h — ABNORMAL HIGH (ref 0–20)

## 2022-08-22 MED ORDER — DILTIAZEM HCL ER COATED BEADS 180 MG PO CP24
360.0000 mg | ORAL_CAPSULE | Freq: Every day | ORAL | Status: DC
  Administered 2022-08-23 – 2022-09-25 (×34): 360 mg via ORAL
  Filled 2022-08-22 (×34): qty 2

## 2022-08-22 MED ORDER — MIDODRINE HCL 5 MG PO TABS
10.0000 mg | ORAL_TABLET | ORAL | Status: AC
  Administered 2022-08-22: 10 mg via ORAL
  Filled 2022-08-22: qty 2

## 2022-08-22 NOTE — Progress Notes (Addendum)
Patient was brought to ultrasound suite for ordered image-guided paracentesis and image-guided thoracentesis procedures. Upon scanning of all four abdominal quadrants, no ascitic fluid was visualized, paracentesis was not attempted.  Patient was unable to tolerate positioning for safe attempt at thoracentesis. Patient was unable to sit for procedure, and when he was rolled onto his left side, patient became acutely short of breath and his oxygen saturation dropped from 90% to the high 60s. Patient was returned to supine position where oxygenation returned to 90%. This was repeated several times with the same subsequent drop in oxygenation. No thoracentesis was performed today.   Please see Imaging tab for further description of US findings.  Kennieth Francois, PA-C 08/22/2022 10:11 AM

## 2022-08-22 NOTE — Progress Notes (Signed)
PROGRESS NOTE  Brett Riley [name] DOB: [birth year] DOA: 08/18/2022 PCP: Dale Pinconning, MD  HPI/Recap of past 24 hours: Brett Riley is a 87 y.o. male with medical history significant of chronic dCHF, HTN, HLD, CAD, CKD-3a, anemia, A fib on Eliquis, who presents 08/18/2022 to ED from rehab, chief complaint SOB.  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 Net IO Since Admission: -5,549.14 mL [08/20/22 1608]. Heparin stopped d/t rectal bleeding overnight.  06/19: euvolemic per cardio, d/c lasix, O2 requirement still high, CT chest reviewed, ?edema/atypical infection, given clinically worsening respiratory status have asked pulmonary and ID to consult. Have d/c amiodarone and ordered steroids in case amio toxicity. Hypoxic, somnolent, so placed back on BiPap. Added lasix and started azithromycin.  Ancef started on 6/16.  Long discussion w/ family - PT AND FAMILY AGREE FOR DO NOT INTUBATE.   08/22/2022: The patient was seen and examined at bedside.  Thoracentesis was attempted earlier but unsuccessful due to positional desaturation.    Assessment/Plan: Principal Problem:   Acute respiratory failure with hypoxia (HCC) Active Problems:   Acute on chronic heart failure with preserved ejection fraction (HFpEF) (HCC)   MSSA bacteremia   Vertebral osteomyelitis (HCC)   CAD (coronary artery disease)   Myocardial injury   Hypertension   Hyperlipidemia   Chronic kidney disease, stage 3a (HCC)   Normocytic anemia   Atrial fibrillation, chronic (HCC)   Paroxysmal atrial  fibrillation (HCC)   HCAP (healthcare-associated pneumonia)   Sepsis (HCC)   Acute on chronic respiratory failure with hypoxia due to acute on chronic diastolic CHF (congestive heart failure), complicated by potential pneumonia, bilateral pleural effusion  Not on oxygen supplementation at baseline Currently on 6 L with O2 saturation of 99%. Sputum sample obtained, awaiting results. Pulmonary and infectious disease were consulted. BiPAP nightly. Continue Ancef and azithromycin   Acute on chronic diastolic CHF 2D echo on 07/21/21 showed  EF of 55-60% with grade 1 diastolic dysfunction Limited echocardiogram revealed LVEF of 50-55% with RV systolic function is normal  BNP 673 on 08/21/22 Lasix was held due to worsening creatinine Continue strict I's and O's and daily weight.   Abnormal CT chest Concerning for pulmonary edema vs atypical infection Procalcitonin 0.28 BNP greater than 600 restarted abx 08/21/22 w/ borderline elevated procalcitonin and concern for atypical infectino on CT Stopped amiodarone in case of lung toxicity, gave prednisone Repeat Bcx Follow sputum culture   Pleural effusion Thoracentesis  Attempted on 08/22/2022 but unsuccessful.   Ascites on exam Paracentesis    Atrial fibrillation, chronic (HCC): Heart rate 97 ASA, diltiazem, lisinopril, metoprolol per cardiology Hold Eliquis given anemia  Stopped amiodarone in case of lung toxicity, gave prednisone   Recent hx of MSSA bacteremia vertebral osteomyelitis (HCC): continue IV Ancef  repeat Blood culture NGx2d   CAD (coronary artery disease) and Myocardial injury:  trop  258, possibly due to demand ischemia secondary to CHF exacerbation.  Patient does not have chest pain. Trend troponin --> 1775 and heparin gtt started --> 1446 Heparin gtt held d/t anemia  Crestor, beta blocker, Aspirin 81 mg daily  Cardiology following  May need coronary angiography at some point but will defer for now      Hypertension IV hydralazine as needed Continue to closely monitor vital signs.   Hyperlipidemia Crestor   Resolved hypokalemia   GERD PPI   Chronic kidney disease, stage 3a (HCC):  Renal function stable.  Baseline creatinine 1.5-1.9 Follow BMP Avoiding fluids d/t CHF   Normocytic anemia:  ABLA w/ rectal bleed S/p 1 unit PRBC 06/18 Hemoglobin downtrending 8.0 from 9.1.   Resolved, leukocytosis. Potentially reactive vs infectious  Follow CBC, adding differential        DVT prophylaxis: on heparin gtt  Pertinent IV fluids/nutrition: None Central lines / invasive devices: PICC in R Brachial     Code Status: DNR, see H&P, discuss w/ pt/family if respiratory deterioration requiring intubation, no CPR ACP documentation reviewed: 08/19/22 - HCPOA w/ 1) Dava Najjar, alternates 2) Erling Conte, 3) Sandra Cockayne FIelds.    Current Admission Status: inpatient  TOC needs / Dispo plan: TBD Barriers to discharge / significant pending items: cardiology recs    Status is: Inpatient The patient requires at least 2 midnights for further evaluation and treatment of present condition.    Objective: Vitals:   08/22/22 0900 08/22/22 1000 08/22/22 1100 08/22/22 1300  BP: (!) 113/59 (!) 75/65 129/75 128/62  Pulse:    73  Resp:      Temp:      TempSrc:      SpO2: 100% 95%  99%  Weight:      Height:        Intake/Output Summary (Last 24 hours) at 08/22/2022 1406 Last data filed at 08/21/2022 2309 Gross per 24 hour  Intake 326.68 ml  Output 500 ml  Net -173.32 ml   Filed Weights   08/18/22 2115 08/20/22 0321 08/21/22 0437  Weight: 91.9 kg 90 kg 89.5 kg    Exam:  General: 87 y.o. year-old male well developed well nourished in no acute distress.  Alert and oriented x3. Cardiovascular: Regular rate and rhythm with no rubs or gallops.  No thyromegaly or JVD noted.   Respiratory: Diffuse rales bilaterally.  Poor respiratory effort.   Abdomen: Soft nontender nondistended  with normal bowel sounds x4 quadrants. Musculoskeletal: 2+ pitting edema in lower extremities bilaterally. Skin: No ulcerative lesions noted or rashes, Psychiatry: Mood is appropriate for condition and setting   Data Reviewed: CBC: Recent Labs  Lab 08/18/22 1020 08/19/22 0505 08/20/22 0540 08/20/22 1830 08/21/22 0619 08/22/22 0505  WBC 10.8* 9.3 9.8  --  12.0*  11.9* 10.4  NEUTROABS 8.9*  --   --   --  9.6* 9.3*  HGB 8.7* 7.6* 7.7* 9.0* 9.1*  9.1* 8.0*  HCT 27.8* 24.1* 24.0* 27.5* 27.9*  27.9* 25.3*  MCV 98.2 96.0 93.8  --  93.0  92.7 94.1  PLT 282 245 285  --  284  267 241   Basic Metabolic Panel: Recent Labs  Lab 08/18/22 1020 08/18/22 1046 08/19/22 0505 08/20/22 0540 08/20/22 1600 08/21/22 0619 08/22/22 0505  NA 139  --  140 135  --  140 141  K 3.6  --  3.4* 2.7* 3.6 3.3* 3.7  CL 106  --  106 99  --  102 105  CO2 22  --  25 24  --  26 27  GLUCOSE 117*  --  100* 96  --  115* 139*  BUN 31*  --  31* 37*  --  43* 55*  CREATININE 1.50*  --  1.56* 1.64*  --  1.90* 2.28*  CALCIUM 8.2*  --  7.9* 7.8*  --  8.0* 8.0*  MG  --  2.0  --  1.7  --   --   --    GFR: Estimated Creatinine Clearance: 26.5 mL/min (A) (by C-G formula based on SCr of 2.28 mg/dL (H)). Liver Function Tests: No results for input(s): "AST", "ALT", "ALKPHOS", "BILITOT", "PROT", "ALBUMIN" in the last 168 hours. No results for input(s): "LIPASE", "AMYLASE" in the last 168 hours. No results for input(s): "AMMONIA" in the last 168 hours. Coagulation Profile: Recent Labs  Lab 08/18/22 2033  INR 1.9*   Cardiac Enzymes: No results for input(s): "CKTOTAL", "CKMB", "CKMBINDEX", "TROPONINI" in the last 168 hours. BNP (last 3 results) No results for input(s): "PROBNP" in the last 8760 hours. HbA1C: No results for input(s): "HGBA1C" in the last 72 hours. CBG: Recent Labs  Lab 08/20/22 0508  GLUCAP 89   Lipid Profile: No results for input(s): "CHOL", "HDL", "LDLCALC", "TRIG", "CHOLHDL",  "LDLDIRECT" in the last 72 hours. Thyroid Function Tests: No results for input(s): "TSH", "T4TOTAL", "FREET4", "T3FREE", "THYROIDAB" in the last 72 hours. Anemia Panel: No results for input(s): "VITAMINB12", "FOLATE", "FERRITIN", "TIBC", "IRON", "RETICCTPCT" in the last 72 hours. Urine analysis:    Component Value Date/Time   COLORURINE AMBER (A) 08/01/2022 1155   APPEARANCEUR CLOUDY (A) 08/01/2022 1155   LABSPEC 1.013 08/01/2022 1155   PHURINE 5.0 08/01/2022 1155   GLUCOSEU NEGATIVE 08/01/2022 1155   HGBUR LARGE (A) 08/01/2022 1155   BILIRUBINUR NEGATIVE 08/01/2022 1155   KETONESUR NEGATIVE 08/01/2022 1155   PROTEINUR 100 (A) 08/01/2022 1155   NITRITE NEGATIVE 08/01/2022 1155   LEUKOCYTESUR NEGATIVE 08/01/2022 1155   Sepsis Labs: @LABRCNTIP (procalcitonin:4,lacticidven:4)  ) Recent Results (from the past 240 hour(s))  Blood culture (routine x 2)     Status: None (Preliminary result)   Collection Time: 08/18/22 10:45 AM   Specimen: BLOOD LEFT ARM  Result Value Ref Range Status   Specimen Description BLOOD LEFT ARM  Final   Special Requests   Final    BOTTLES DRAWN AEROBIC AND ANAEROBIC Blood Culture adequate volume   Culture   Final    NO GROWTH 4 DAYS Performed at [facility], [address] [place], [place] [address]    Report Status PENDING  Incomplete  Resp Panel by RT-PCR (Flu A&B, Covid) Anterior Nasal Swab     Status: None   Collection Time: 08/18/22 10:46 AM   Specimen: Anterior Nasal Swab  Result Value Ref Range Status   SARS Coronavirus 2 by RT PCR NEGATIVE NEGATIVE Final    Comment: (NOTE) SARS-CoV-2 target nucleic acids are NOT DETECTED.  The SARS-CoV-2 RNA is generally detectable in upper respiratory specimens during the acute phase of infection. The lowest concentration of SARS-CoV-2 viral copies this assay can detect is 138 copies/mL. A negative result does not preclude SARS-Cov-2 infection and should not be used as the sole basis for  treatment or other patient management decisions. A negative result may occur with  improper specimen collection/handling, submission of specimen other than nasopharyngeal swab, presence of viral mutation(s) within the areas targeted by this assay, and inadequate number of viral copies(<138 copies/mL). A negative result must be combined with clinical observations, patient history, and epidemiological information. The expected result is Negative.  Fact Sheet for Patients:  BloggerCourse.com  Fact Sheet for Healthcare Providers:  SeriousBroker.it  This test is no t yet approved or cleared by the [place] and  has been authorized for detection and/or diagnosis of SARS-CoV-2 by FDA under an Emergency Use Authorization (EUA). This EUA will remain  in effect (meaning this test can be used) for the duration of the COVID-19 declaration under Section 564(b)(1) of the Act, 21 U.S.C.section 360bbb-3(b)(1), unless the authorization is terminated  or revoked sooner.       Influenza A by PCR NEGATIVE NEGATIVE Final   Influenza B by PCR NEGATIVE NEGATIVE Final    Comment: (NOTE) The Xpert Xpress SARS-CoV-2/FLU/RSV plus assay is intended as an aid in the diagnosis of influenza from Nasopharyngeal swab specimens and should not be used as a sole basis for treatment. Nasal washings and aspirates are unacceptable for Xpert Xpress SARS-CoV-2/FLU/RSV testing.  Fact Sheet for Patients: BloggerCourse.com  Fact Sheet for Healthcare Providers: SeriousBroker.it  This test is not yet approved or cleared by the [place] FDA and has been authorized for detection and/or diagnosis of SARS-CoV-2 by FDA under an Emergency Use Authorization (EUA). This EUA will remain in effect (meaning this test can be used) for the duration of the COVID-19 declaration under Section 564(b)(1) of the Act, 21  U.S.C. section 360bbb-3(b)(1), unless the authorization is terminated or revoked.  Performed at [facility], [address] [place], [place] [address]   Culture, blood (Routine X 2) w Reflex to ID Panel     Status: None (Preliminary result)   Collection Time: 08/18/22  9:19 PM   Specimen: BLOOD LEFT ARM  Result Value Ref Range Status   Specimen Description BLOOD LEFT ARM  Final   Special Requests   Final    BOTTLES DRAWN AEROBIC AND ANAEROBIC Blood Culture adequate volume   Culture   Final    NO GROWTH 4 DAYS Performed at [facility], [address] [place], [place] [address]    Report Status PENDING  Incomplete  Culture, blood (Routine X 2) w Reflex to ID Panel     Status: None (Preliminary result)   Collection Time: 08/21/22  8:58 PM   Specimen: BLOOD  Result Value Ref Range Status   Specimen Description BLOOD LEFT WRIST  Final   Special Requests   Final    BOTTLES DRAWN AEROBIC AND ANAEROBIC Blood Culture adequate volume   Culture   Final    NO GROWTH < 12 HOURS Performed at [facility], [address] [place], [place] [address]    Report Status PENDING  Incomplete  Culture, blood (Routine X 2) w Reflex to ID Panel     Status: None (Preliminary result)   Collection Time: 08/21/22 11:01 PM   Specimen: BLOOD  Result Value Ref Range Status   Specimen Description BLOOD BLOOD LEFT ARM  Final   Special Requests   Final    BOTTLES DRAWN AEROBIC AND ANAEROBIC Blood Culture adequate volume   Culture   Final    NO GROWTH < 12 HOURS Performed at [facility], [address] [place], [place] [address]    Report Status PENDING  Incomplete  Expectorated Sputum Assessment w Gram Stain, Rflx to Resp Cult     Status: None   Collection Time: 08/22/22 11:22 AM   Specimen: Sputum  Result Value Ref Range Status   Specimen Description SPUTUM  Final   Special Requests EXPSU  Final   Sputum evaluation   Final    THIS SPECIMEN IS ACCEPTABLE  FOR SPUTUM CULTURE Performed at [facility], [address], [place], [place] [address]    Report  Status 08/22/2022 FINAL  Final      Studies: [place] CHEST (PLEURAL EFFUSION)  Result Date: 08/22/2022 CLINICAL DATA:  Patient with decompensated CHF, bilateral pleural effusions. Request received for diagnostic and therapeutic paracentesis. EXAM: CHEST ULTRASOUND COMPARISON:  CT chest without contrast 08/21/2022 FINDINGS: Moderate right pleural effusion visualized, however patient was unable to tolerate positioning for ordered thoracentesis. Patient was unable to sit for procedure, and when he was rolled, patient became acutely short of breath and his oxygen saturation dropped into the 60-70% range. Due to patient being unable to tolerate positioning, thoracentesis was not performed today. IMPRESSION: Moderate right pleural effusion. Patient unable to tolerate positioning for thoracentesis to be performed. Thoracentesis deferred at this time. Read by: Mina Marble, PA-C Electronically Signed   By: Olive Bass M.D.   On: 08/22/2022 13:11   [place] ASCITES (ABDOMEN LIMITED)  Result Date: 08/22/2022 CLINICAL DATA:  Patient with history of decompensated congestive heart failure. Request received for diagnostic and therapeutic paracentesis. EXAM: LIMITED ABDOMEN ULTRASOUND FOR ASCITES TECHNIQUE: Limited ultrasound survey for ascites was performed in all four abdominal quadrants. COMPARISON:  None Available. FINDINGS: No abdominal ascites was visualized on ultrasound. Paracentesis was not performed due to lack visualized fluid. IMPRESSION: No abdominal ascites visualized. Paracentesis was deferred at this time for lack of fluid. Read by: Mina Marble, PA-C Electronically Signed   By: Olive Bass M.D.   On: 08/22/2022 13:04    Scheduled Meds:  aspirin EC  81 mg Oral QHS   Chlorhexidine Gluconate Cloth  6 each Topical Daily   cyanocobalamin  1,000 mcg Oral Daily   diltiazem  420 mg Oral Daily    feeding supplement  237 mL Oral BID BM   metoprolol succinate  100 mg Oral Daily   multivitamins with iron  1 tablet Oral Daily   pantoprazole  40 mg Oral Daily   polyethylene glycol  17 g Oral Daily   rosuvastatin  40 mg Oral Daily   senna  1 tablet Oral Daily    Continuous Infusions:  azithromycin 500 mg (08/21/22 1903)   ceFAZolin 2 g (08/22/22 1345)     LOS: 4 days     Darlin Drop, MD [facility] Pager [phone]  If 7PM-7AM, please contact night-coverage [email or web address] Password [email or web address] 08/22/2022, 2:06 PM

## 2022-08-22 NOTE — Progress Notes (Signed)
Pt tachypneic.  RR high 30s to low 40s Self resolved back to mid 20s approx 20 min after bipap check

## 2022-08-22 NOTE — Progress Notes (Signed)
OT Cancellation Note  Patient Details Name: TOMA BECHARD MRN: [id number] DOB: May 28, 1935   Cancelled Treatment:    Reason Eval/Treat Not Completed: Other (comment) (per family and nurse, pt is being upgraded to a higher level of care. No continue at transfer orders in chart. Will need need OT referral when pt is medically approrpiate. Provider notified.)  Oleta Mouse, OTD OTR/L  08/22/22, 2:13 PM

## 2022-08-22 NOTE — Progress Notes (Addendum)
PT Cancellation Note  Patient Details Name: Brett Riley MRN: [id number] DOB: October 11, 1935   Cancelled Treatment:    Reason Eval/Treat Not Completed: Patient at procedure or test/unavailable. Patient currently scheduled for a procedure today, will hold and re attempt this afternoon.   14:14 6/20 Patient being transferred to [facility] after change in status. No continuation of care orders placed at this time. Will sign off and wait for new orders.   Malachi Carl, SPT   Malachi Carl 08/22/2022, 8:34 AM

## 2022-08-22 NOTE — Consult Note (Signed)
PULMONOLOGY         Date: 08/22/2022,   MRN# [id number] Brett Riley 15-Jun-1935     AdmissionWeight: 91.9 kg                 CurrentWeight: 89.5 kg  Referring provider: Dr Lyn Hollingshead    CHIEF COMPLAINT:   Acute on chronic hypoxemic respiratory failure   HISTORY OF PRESENT ILLNESS   This is a pleasant 87yo M previosly in his usual state of health who has hx of HTN, HLD, CAD, CKD-3a, anemia, A fib on Eliquis, who presents with SOB.   Pt is s/p of aortic ulcer repair on 5/22. Pt developed sepsis and found to have MSSA bacteremia on 5/31. MRI of the lumbar spine shows significant osteomyelitis with abscess.  Neurosurgery consult obtained, not recommended surgery. TEE was performed on 6/3, no evidence of endocarditis. Pt was discharged on Ancef every 8 hours (last day is 09/13/2022). BNP 279, troponin level 258, lactic acid 1.8, procalcitonin< 0.10, negative PCR for COVID and flu, stable renal function, temperature normal at 97.8, blood pressure 134/88, heart rate 97, RR 20.  Chest x-ray showed bilateral interstitial opacity, more confluent in the right upper lobe.  VBG with pH 7.45, CO2 43 and O2 44.  CT chest was performed with bilateral pleural effusions and compressive atelectasis.    PAST MEDICAL HISTORY   Past Medical History:  Diagnosis Date   Cancer [facility])    skin cancer basal   Carotid arterial disease (HCC)    a. 02/2016 L CEA; b. 01/2017 U/S: patent LICA, 1-39% RICA.   Celiac artery stenosis (HCC)    Coronary artery disease    a. 01/2016 MV: mild apical/basal inferior, apical lateral, mid anterolateral, and mid inferolateral ischemia. EF 57%; b. 02/2016 Cath: LM 40ost, LAD 70p/m, 20d, D1 95 (small), LCX nl, RCA 75m, RPDA 90 (small), EF 55-65%-->med Rx. Rec CABG for recurrent symptoms.   Hearing loss    History of echocardiogram    a. 02/2016 Echo: EF 50-55%, no rwma, mild MR.   History of kidney stones    Hypercholesterolemia    Hypertension    Intraosseous  ganglion    3.3 cm left acetabulum   Osteoarthritis, hip, bilateral    Left > Right     SURGICAL HISTORY   Past Surgical History:  Procedure Laterality Date   AORTIC INTERVENTION N/A 07/24/2022   Procedure: AORTIC INTERVENTION;  Surgeon: Renford Dills, MD;  Location: [facility] INVASIVE CV LAB;  Service: Cardiovascular;  Laterality: N/A;   CARDIAC CATHETERIZATION N/A 01/19/2016   Procedure: Left Heart Cath and Coronary Angiography;  Surgeon: Iran Ouch, MD;  Location: [facility] INVASIVE CV LAB;  Service: Cardiovascular;  Laterality: N/A;   COLONOSCOPY     CYSTOSCOPY/URETEROSCOPY/HOLMIUM LASER/STENT PLACEMENT Right 04/13/2021   Procedure: CYSTOSCOPY/URETEROSCOPY/HOLMIUM LASER/STENT PLACEMENT;  Surgeon: Sondra Come, MD;  Location: [facility] ORS;  Service: Urology;  Laterality: Right;   ENDARTERECTOMY Left 02/15/2016   Procedure: ENDARTERECTOMY CAROTID;  Surgeon: Annice Needy, MD;  Location: [facility] ORS;  Service: Vascular;  Laterality: Left;   TEE WITHOUT CARDIOVERSION N/A 08/05/2022   Procedure: TRANSESOPHAGEAL ECHOCARDIOGRAM;  Surgeon: Debbe Odea, MD;  Location: [facility] ORS;  Service: Cardiovascular;  Laterality: N/A;     FAMILY HISTORY   Family History  Problem Relation Age of Onset   Stroke Mother    Heart disease Father        MI   Heart attack Father    Breast cancer  Sister    Colon cancer Neg Hx    Prostate cancer Neg Hx      SOCIAL HISTORY   Social History   Tobacco Use   Smoking status: Never   Smokeless tobacco: Never  Vaping Use   Vaping Use: Never used  Substance Use Topics   Alcohol use: No    Alcohol/week: 0.0 standard drinks of alcohol   Drug use: No     MEDICATIONS    Home Medication:    Current Medication:  Current Facility-Administered Medications:    acetaminophen (TYLENOL) tablet 650 mg, 650 mg, Oral, Q6H PRN, Lorretta Harp, MD   albuterol (PROVENTIL) (2.5 MG/3ML) 0.083% nebulizer solution 2.5 mg, 2.5 mg, Nebulization, Q4H PRN, Lorretta Harp,  MD   aspirin EC tablet 81 mg, 81 mg, Oral, QHS, Niu, Brien Few, MD, 81 mg at 08/20/22 2121   azithromycin (ZITHROMAX) 500 mg in sodium chloride 0.9 % 250 mL IVPB, 500 mg, Intravenous, q1800, Sunnie Nielsen, DO, Last Rate: 250 mL/hr at 08/21/22 1903, 500 mg at 08/21/22 1903   ceFAZolin (ANCEF) IVPB 2g/100 mL premix, 2 g, Intravenous, Q8H, Ronnald Ramp, RPH, Last Rate: 200 mL/hr at 08/22/22 0505, 2 g at 08/22/22 0505   Chlorhexidine Gluconate Cloth 2 % PADS 6 each, 6 each, Topical, Daily, Lorretta Harp, MD, 6 each at 08/21/22 1000   cyanocobalamin (VITAMIN B12) tablet 1,000 mcg, 1,000 mcg, Oral, Daily, Lorretta Harp, MD, 1,000 mcg at 08/21/22 0908   dextromethorphan-guaiFENesin (MUCINEX DM) 30-600 MG per 12 hr tablet 1 tablet, 1 tablet, Oral, BID PRN, Lorretta Harp, MD   diltiazem (CARDIZEM CD) 24 hr capsule 420 mg, 420 mg, Oral, Daily, Sunnie Nielsen, DO, 420 mg at 08/21/22 0908   feeding supplement (ENSURE ENLIVE / ENSURE PLUS) liquid 237 mL, 237 mL, Oral, BID BM, Lorretta Harp, MD, 237 mL at 08/21/22 1416   hydrALAZINE (APRESOLINE) injection 5 mg, 5 mg, Intravenous, Q2H PRN, Lorretta Harp, MD   lisinopril (ZESTRIL) tablet 40 mg, 40 mg, Oral, Daily, Lorretta Harp, MD, 40 mg at 08/21/22 0908   metoprolol succinate (TOPROL-XL) 24 hr tablet 100 mg, 100 mg, Oral, Daily, Lorretta Harp, MD, 100 mg at 08/21/22 0908   multivitamins with iron tablet 1 tablet, 1 tablet, Oral, Daily, Sunnie Nielsen, DO, 1 tablet at 08/21/22 0917   ondansetron (ZOFRAN) injection 4 mg, 4 mg, Intravenous, Q8H PRN, Lorretta Harp, MD   pantoprazole (PROTONIX) EC tablet 40 mg, 40 mg, Oral, Daily, Lorretta Harp, MD, 40 mg at 08/21/22 0908   polyethylene glycol (MIRALAX / GLYCOLAX) packet 17 g, 17 g, Oral, Daily, Lorretta Harp, MD, 17 g at 08/20/22 0929   rosuvastatin (CRESTOR) tablet 40 mg, 40 mg, Oral, Daily, Mansy, Jan A, MD, 40 mg at 08/21/22 0908   senna (SENOKOT) tablet 8.6 mg, 1 tablet, Oral, Daily, Lorretta Harp, MD, 8.6 mg at 08/21/22 0908    simethicone (MYLICON) chewable tablet 80 mg, 80 mg, Oral, QID PRN, Mansy, Jan A, MD, 80 mg at 08/20/22 2121   sodium chloride (OCEAN) 0.65 % nasal spray 1 spray, 1 spray, Each Nare, PRN, Sunnie Nielsen, DO, 1 spray at 08/20/22 0448   traMADol (ULTRAM) tablet 50 mg, 50 mg, Oral, Q6H PRN, Lorretta Harp, MD, 50 mg at 08/20/22 1801    ALLERGIES   Patient has no known allergies.     REVIEW OF SYSTEMS    Review of Systems:  Gen:  Denies  fever, sweats, chills weigh loss  HEENT: Denies blurred vision, double vision, ear pain, eye  pain, hearing loss, nose bleeds, sore throat Cardiac:  No dizziness, chest pain or heaviness, chest tightness,edema Resp:   reports dyspnea chronically  Gi: Denies swallowing difficulty, stomach pain, nausea or vomiting, diarrhea, constipation, bowel incontinence Gu:  Denies bladder incontinence, burning urine Ext:   Denies Joint pain, stiffness or swelling Skin: Denies  skin rash, easy bruising or bleeding or hives Endoc:  Denies polyuria, polydipsia , polyphagia or weight change Psych:   Denies depression, insomnia or hallucinations   Other:  All other systems negative   VS: BP 117/65 (BP Location: Right Arm)   Pulse 66   Temp (!) 97.5 F (36.4 C) (Oral)   Resp 16   Ht 6' (1.829 m)   Wt 89.5 kg   SpO2 94%   BMI 26.76 kg/m      PHYSICAL EXAM    GENERAL:NAD, no fevers, chills, no weakness no fatigue HEAD: Normocephalic, atraumatic.  EYES: Pupils equal, round, reactive to light. Extraocular muscles intact. No scleral icterus.  MOUTH: Moist mucosal membrane. Dentition intact. No abscess noted.  EAR, NOSE, THROAT: Clear without exudates. No external lesions.  NECK: Supple. No thyromegaly. No nodules. No JVD.  PULMONARY: decreased breath sounds with mild rhonchi worse at bases bilaterally.  CARDIOVASCULAR: S1 and S2. Regular rate and rhythm. No murmurs, rubs, or gallops. No edema. Pedal pulses 2+ bilaterally.  GASTROINTESTINAL: Soft, nontender,  nondistended. No masses. Positive bowel sounds. No hepatosplenomegaly.  MUSCULOSKELETAL: No swelling, clubbing, or edema. Range of motion full in all extremities.  NEUROLOGIC: Cranial nerves II through XII are intact. No gross focal neurological deficits. Sensation intact. Reflexes intact.  SKIN: No ulceration, lesions, rashes, or cyanosis. Skin warm and dry. Turgor intact.  PSYCHIATRIC: Mood, affect within normal limits. The patient is awake, alert and oriented x 3. Insight, judgment intact.       IMAGING   CT chest - atectasis, pleural effusions possible pneumonia vs CHF  ASSESSMENT/PLAN   Acute hypoxemic respiratory failure   -patient had surgery May 2024   - has declined and now with acute hypoxemia   - will perform thoracentesis, septic workup   - wife reports last fever was 08/18/22   - does not appear toxic is breathing nonlabored on 5L/min [place]   - IS at bedside for atelectasis   - pleural fluid studies to stratify exudative vs trasudative to narrow differential   -agree with current workup thus far   - patient never smoker no history of COPD or emphysema  Acute on chronic Diastolic CHF   BIPAP   Fluid restriction    Strict I&O  - cardiology on case appreciate input   Additional significant comorbidities CKD, AF, HTN, CAD        Thank you for allowing me to participate in the care of this patient.   Patient/Family are satisfied with care plan and all questions have been answered.    Provider disclosure: Patient with at least one acute or chronic illness or injury that poses a threat to life or bodily function and is being managed actively during this encounter.  All of the below services have been performed independently by signing provider:  review of prior documentation from internal and or external health records.  Review of previous and current lab results.  Interview and comprehensive assessment during patient visit today. Review of current and previous chest  radiographs/CT scans. Discussion of management and test interpretation with health care team and patient/family.   This document was prepared using [employer]  software and may include unintentional dictation errors.     Ottie Glazier, M.D.  Division of Pulmonary & Critical Care Medicine

## 2022-08-22 NOTE — Consult Note (Incomplete)
NAME: Brett Riley  DOB: [birth year]  MRN: [id number]  Date/Time: 08/22/2022 2:08 PM  REQUESTING PROVIDER: Dr.Hall Subjective:  REASON FOR CONSULT: pneumonia  pt does not give any history,family at bed side and chart reviewed Brett Riley is a 87 y.o. 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 A suprarenal aortic extension cuff was also placed of 25mm* 80mm  On On 5/30 he developed rt knee pain and effusion and increased weakness and had a fever of 102.7. Blood culture 4/4 MSSA  MRI of lumbar spine L-L2 discitis Brett Riley and small ventral phlegmon L2-L3 left facet joint septic arthritis ? L4-L5/L5-s1 disicits. Assessed by neurosurgeon and surgery was not deemed necessary- assessed by ortho for rt knee pain and they did nit think it was septic arthritis He also had afib during that hospitalization. TEE neg for endocarditis He was discharged to SNF on 6/7/ on cefazolin until 09/13/22 He came to the ED on 6/16 brought in by EMS for resp distress SNF- sats were 84%  HE was initially on CPAP and then BIPAP In the ED he also had sputum which was pink colored  He had a fever of 100.9 Procal < 0.10 Increasing troponin CXR b/l infiltrrate suggestive of CHF.  Was seen by cardiology He was diuresed but that increased his creatinine He continued to be short of breath I am asked to see the patient to rule out pneumonia Pulmonology has also been consulted. He had CT chest and it showed b/l infiltrates especially of the upper lobe and also ilteral pleural effusion. He went for pleural fluid aspiration but he desaturated when turned on the side and hence it was abandoned Amiodarone was stopped by cardiology  yesterday with concern that it may have caused pneumonitis Resp panel PCR was sent yesterday    Past Medical History:  Diagnosis Date  . Cancer (HCC)    skin cancer basal  . Carotid arterial disease (HCC)    a. 02/2016 L CEA; b. 01/2017 U/S: patent LICA, 1-39% RICA.  Marland Kitchen Celiac artery stenosis (HCC)   . Coronary artery disease    a. 01/2016 MV: mild apical/basal inferior, apical lateral, mid anterolateral, and mid inferolateral ischemia. EF 57%; b. 02/2016 Cath: LM 40ost, LAD 70p/m, 20d, D1 95 (small), LCX nl, RCA 56m, RPDA 90 (small), EF 55-65%-->med Rx. Rec CABG for recurrent symptoms.  Marland Kitchen Hearing loss   . History of echocardiogram    a. 02/2016 Echo: EF 50-55%, no rwma, mild MR.  Marland Kitchen History of kidney stones   . Hypercholesterolemia   . Hypertension   . Intraosseous ganglion    3.3 cm left acetabulum  . Osteoarthritis, hip, bilateral    Left > Right    Past Surgical History:  Procedure Laterality Date  . AORTIC INTERVENTION N/A 07/24/2022   Procedure: AORTIC INTERVENTION;  Surgeon: Renford Dills, MD;  Location: [facility] INVASIVE CV [facility];  Service: Cardiovascular;  Laterality: N/A;  . CARDIAC CATHETERIZATION N/A 01/19/2016   Procedure: Left Heart Cath and Coronary Angiography;  Surgeon: Iran Ouch, MD;  Location: [facility] INV[facility] CV [facility];  Service: Cardiovascular;  Laterality: N/A;  . COLONOSCOPY    . CYSTOSCOPY/URETEROSCOPY/HOLMIUM LASER/STENT PLACEMENT Right 04/13/2021   Procedure: CYSTOSCOPY/URETEROSCOPY/HOLMIUM LASER/STENT PLACEMENT;  Surgeon: Sondra Come, MD;  Location: [facility] ORS;  Service: Urology;  Laterality: Right;  . ENDARTERECTOMY Left 02/15/2016   Procedure: ENDARTERECTOMY CAROTID;  Surgeon: Annice Needy, MD;  Location: [facility] ORS;  Service: Vascular;  Laterality: Left;  . TEE WITHOUT CARDIOVERSION N/A 08/05/2022   Procedure: TRANSESOPHAGEAL ECHOCARDIOGRAM;  Surgeon: Debbe Odea, MD;  Location: [facility] OR[facility];  Service: Cardiovascular;  Laterality: N/A;    Social  History   Socioeconomic History  . Marital status: Married    Spouse name: Not on file  . Number of children: 1  . Years of education: Not on file  . Highest education level: Not on file  Occupational History    Employer: [employer]  Tobacco Use  . Smoking status: Never  . Smokeless tobacco: Never  Vaping Use  . Vaping Use: Never used  Substance and Sexual Activity  . Alcohol use: No    Alcohol/week: 0.0 standard drinks of alcohol  . Drug use: No  . Sexual activity: Not Currently  Other Topics Concern  . Not on file  Social History Narrative  . Not on file   Social Determinants of Health   Financial Resource Strain: Low Risk  (08/29/2020)   Overall Financial Resource Strain (CARDIA)   . Difficulty of Paying Living Expenses: Not hard at all  Food Insecurity: No Food Insecurity (08/18/2022)   Hunger Vital Sign   . Worried About Programme researcher, broadcasting/film/video in the Last Year: Never true   . Ran Out of Food in the Last Year: Never true  Transportation Needs: No Transportation Needs (08/18/2022)   PRAPARE - Transportation   . Lack of Transportation (Medical): No   . Lack of Transportation (Non-Medical): No  Physical Activity: Insufficiently Active (08/29/2020)   Exercise Vital Sign   . Days of Exercise per Week: 3 days   . Minutes of Exercise per Session: 20 min  Stress: No Stress Concern Present (08/29/2020)   [employer] of Occupational Health - Occupational Stress Questionnaire   . Feeling of Stress : Not at all  Social Connections: Unknown (08/29/2020)   Social Connection and Isolation Panel [NHANES]   . Frequency of Communication with Friends and Family: Not on file   . Frequency of Social Gatherings with Friends and Family: Not on file   . Attends Religious Services: Not on file   . Active Member of Clubs or Organizations: Not on file   . Attends Banker Meetings: Not on file   . Marital Status: Married  Catering manager Violence: Not At Risk (08/18/2022)    Humiliation, Afraid, Rape, and Kick questionnaire   . Fear of Current or Ex-Partner: No   . Emotionally Abused: No   . Physically Abused: No   . Sexually Abused: No    Family History  Problem Relation Age of Onset  . Stroke Mother   . Heart disease Father        MI  . Heart attack Father   . Breast cancer Sister   . Colon cancer Neg Hx   . Prostate cancer Neg Hx    No Known Allergies I  Current Facility-Administered Medications  Medication Dose Route Frequency Provider Last Rate Last Admin  . acetaminophen (TYLENOL) tablet 650 mg  650 mg Oral Q6H PRN Lorretta Harp, MD      . albuterol (PROVENTIL) (2.5 MG/3ML) 0.083% nebulizer solution 2.5 mg  2.5 mg Nebulization Q4H PRN Lorretta Harp, MD      . aspirin EC tablet 81  mg  81 mg Oral QHS Lorretta Harp, MD   81 mg at 08/20/22 2121  . azithromycin (ZITHROMAX) 500 mg in sodium chloride 0.9 % 250 mL IVPB  500 mg Intravenous q1800 Sunnie Nielsen, DO 250 mL/hr at 08/21/22 1903 500 mg at 08/21/22 1903  . ceFAZolin (ANCEF) IVPB 2g/100 mL premix  2 g Intravenous Q8H Ronnald Ramp, RPH 200 mL/hr at 08/22/22 1345 2 g at 08/22/22 1345  . Chlorhexidine Gluconate Cloth 2 % PADS 6 each  6 each Topical Daily Lorretta Harp, MD   6 each at 08/22/22 1100  . cyanocobalamin (VITAMIN B12) tablet 1,000 mcg  1,000 mcg Oral Daily Lorretta Harp, MD   1,000 mcg at 08/22/22 1023  . dextromethorphan-guaiFENesin (MUCINEX DM) 30-600 MG per 12 hr tablet 1 tablet  1 tablet Oral BID PRN Lorretta Harp, MD      . diltiazem (CARDIZEM CD) 24 hr capsule 420 mg  420 mg Oral Daily Sunnie Nielsen, DO   420 mg at 08/21/22 0908  . feeding supplement (ENSURE ENLIVE / ENSURE PLUS) liquid 237 mL  237 mL Oral BID BM Lorretta Harp, MD   237 mL at 08/21/22 1416  . hydrALAZINE (APRESOLINE) injection 5 mg  5 mg Intravenous Q2H PRN Lorretta Harp, MD      . metoprolol succinate (TOPROL-XL) 24 hr tablet 100 mg  100 mg Oral Daily Lorretta Harp, MD   100 mg at 08/21/22 0908  . multivitamins with iron tablet 1  tablet  1 tablet Oral Daily Sunnie Nielsen, DO   1 tablet at 08/22/22 1026  . ondansetron (ZOFRAN) injection 4 mg  4 mg Intravenous Q8H PRN Lorretta Harp, MD      . pantoprazole (PROTONIX) EC tablet 40 mg  40 mg Oral Daily Lorretta Harp, MD   40 mg at 08/22/22 1023  . polyethylene glycol (MIRALAX / GLYCOLAX) packet 17 g  17 g Oral Daily Lorretta Harp, MD   17 g at 08/20/22 0929  . rosuvastatin (CRESTOR) tablet 40 mg  40 mg Oral Daily Mansy, Jan A, MD   40 mg at 08/22/22 1024  . senna (SENOKOT) tablet 8.6 mg  1 tablet Oral Daily Lorretta Harp, MD   8.6 mg at 08/22/22 1023  . simethicone (MYLICON) chewable tablet 80 mg  80 mg Oral QID PRN Mansy, Vernetta Honey, MD   80 mg at 08/20/22 2121  . sodium chloride (OCEAN) 0.65 % nasal spray 1 spray  1 spray Each Nare PRN Sunnie Nielsen, DO   1 spray at 08/20/22 0448  . traMADol (ULTRAM) tablet 50 mg  50 mg Oral Q6H PRN Lorretta Harp, MD   50 mg at 08/20/22 1801     Abtx:  Anti-infectives (From admission, onward)    Start     Dose/Rate Route Frequency Ordered Stop   08/21/22 1815  azithromycin (ZITHROMAX) 500 mg in sodium chloride 0.9 % 250 mL IVPB        500 mg 250 mL/hr over 60 Minutes Intravenous Daily-1800 08/21/22 1807     08/18/22 1400  ceFAZolin (ANCEF) IVPB 2g/100 mL premix       Note to Pharmacy: Indication:  Vertebral osteomyelitis  First Dose: Yes Last Day of Therapy:  09/13/22  Labs - Once weekly (Monday):  CBC/D and CMP, Labs - Once weekly (Monday): ESR and CRP Fax weekly lab results  promptly to [phone] Method of administration: IV Push- Please pull PICC at completion of IV   2 g 200 mL/hr over 30  Minutes Intravenous Every 8 hours 08/18/22 1319 09/13/22 2359   08/18/22 1115  vancomycin (VANCOCIN) IVPB 1000 mg/200 mL premix        1,000 mg 200 mL/hr over 60 Minutes Intravenous  Once 08/18/22 1107 08/18/22 1322   08/18/22 1115  ceFEPIme (MAXIPIME) 2 g in sodium chloride 0.9 % 100 mL IVPB        2 g 200 mL/hr over 30 Minutes Intravenous  Once  08/18/22 1107 08/18/22 1211       REVIEW OF SYSTEMS:  NA He is somnolent but easily arousable and says he is okay: Objective:  VITALS:  BP 128/62 (BP Location: Left Arm)   Pulse 73   Temp 97.9 F (36.6 C)   Resp 16   Ht 6' (1.829 m)   Wt 89.5 kg   SpO2 99%   BMI 26.76 kg/m   PHYSICAL EXAM:  General: Pt is somnolent , but can be easily arousable. Able to say that he is okay but cannot sustain his attention Head: Normocephalic, without obvious abnormality, atraumatic. Eyes: Conjunctivae clear, anicteric sclerae. Pupils are equal Neck: symmetrical, no adenopathy, thyroid: non tender no carotid bruit and no JVD. Lungs: /l air entry crepts both sides Heart: irregular Abdomen: Soft, non-tender,not distended. Bowel sounds normal. No masses Extremities: some edema ankles Able to passively move both knees/legs Skin: No rashes or lesions. Or bruising Lymph: Cervical, supraclavicular normal. Neurologic: cannot assess in detail Pertinent Labs Lab Results CBC    Component Value Date/Time   WBC 10.4 08/22/2022 0505   RBC 2.69 (L) 08/22/2022 0505   HGB 8.0 (L) 08/22/2022 0505   HCT 25.3 (L) 08/22/2022 0505   PLT 241 08/22/2022 0505   MCV 94.1 08/22/2022 0505   MCH 29.7 08/22/2022 0505   MCHC 31.6 08/22/2022 0505   RDW 14.2 08/22/2022 0505   LYMPHSABS 0.9 08/22/2022 0505   MONOABS 0.1 08/22/2022 0505   EOSABS 0.0 08/22/2022 0505   BASOSABS 0.0 08/22/2022 0505       Latest Ref Rng & Units 08/22/2022    5:05 AM 08/21/2022    6:19 AM 08/20/2022    4:00 PM  CMP  Glucose 70 - 99 mg/dL 960  454    BUN 8 - 23 mg/dL 55  43    Creatinine 0.98 - 1.24 mg/dL 1.19  1.47    Sodium 829 - 145 mmol/L 141  140    Potassium 3.5 - 5.1 mmol/L 3.7  3.3  3.6   Chloride 98 - 111 mmol/L 105  102    CO2 22 - 32 mmol/L 27  26    Calcium 8.9 - 10.3 mg/dL 8.0  8.0        Microbiology: Recent Results (from the past 240 hour(s))  Blood culture (routine x 2)     Status: None (Preliminary  result)   Collection Time: 08/18/22 10:45 AM   Specimen: BLOOD LEFT ARM  Result Value Ref Range Status   Specimen Description BLOOD LEFT ARM  Final   Special Requests   Final    BOTTLES DRAWN AEROBIC AND ANAEROBIC Blood Culture adequate volume   Culture   Final    NO GROWTH 4 DAYS Performed at [facility], [address] [place], [place] [address]    Report Status PENDING  Incomplete  Resp Panel by RT-PCR (Flu A&B, Covid) Anterior Nasal Swab     Status: None   Collection Time: 08/18/22 10:46 AM   Specimen: Anterior Nasal Swab  Result Value Ref  Range Status   SARS Coronavirus 2 by RT PCR NEGATIVE NEGATIVE Final    Comment: (NOTE) SARS-CoV-2 target nucleic acids are NOT DETECTED.  The SARS-CoV-2 RNA is generally detectable in upper respiratory specimens during the acute phase of infection. The lowest concentration of SARS-CoV-2 viral copies this assay can detect is 138 copies/mL. A negative result does not preclude SARS-Cov-2 infection and should not be used as the sole basis for treatment or other patient management decisions. A negative result may occur with  improper specimen collection/handling, submission of specimen other than nasopharyngeal swab, presence of viral mutation(s) within the areas targeted by this assay, and inadequate number of viral copies(<138 copies/mL). A negative result must be combined with clinical observations, patient history, and epidemiological information. The expected result is Negative.  Fact Sheet for Patients:  BloggerCourse.com  Fact Sheet for Healthcare Providers:  SeriousBroker.it  This test is no t yet approved or cleared by the [place] FDA and  has been authorized for detection and/or diagnosis of SARS-CoV-2 by FDA under an Emergency Use Authorization (EUA). This EUA will remain  in effect (meaning this test can be used) for the duration of the COVID-19 declaration  under Section 564(b)(1) of the Act, 21 U.S.C.section 360bbb-3(b)(1), unless the authorization is terminated  or revoked sooner.       Influenza A by PCR NEGATIVE NEGATIVE Final   Influenza B by PCR NEGATIVE NEGATIVE Final    Comment: (NOTE) The Xpert Xpress SARS-CoV-2/FLU/RSV plus assay is intended as an aid in the diagnosis of influenza from Nasopharyngeal swab specimens and should not be used as a sole basis for treatment. Nasal washings and aspirates are unacceptable for Xpert Xpress SARS-CoV-2/FLU/RSV testing.  Fact Sheet for Patients: BloggerCourse.com  Fact Sheet for Healthcare Providers: SeriousBroker.it  This test is not yet approved or cleared by the [place] FDA and has been authorized for detection and/or diagnosis of SARS-CoV-2 by FDA under an Emergency Use Authorization (EUA). This EUA will remain in effect (meaning this test can be used) for the duration of the COVID-19 declaration under Section 564(b)(1) of the Act, 21 U.S.C. section 360bbb-3(b)(1), unless the authorization is terminated or revoked.  Performed at [facility], [address] [place], [place] [address]   Culture, blood (Routine X 2) w Reflex to ID Panel     Status: None (Preliminary result)   Collection Time: 08/18/22  9:19 PM   Specimen: BLOOD LEFT ARM  Result Value Ref Range Status   Specimen Description BLOOD LEFT ARM  Final   Special Requests   Final    BOTTLES DRAWN AEROBIC AND ANAEROBIC Blood Culture adequate volume   Culture   Final    NO GROWTH 4 DAYS Performed at [facility], [address], [place], [place] [address]    Report Status PENDING  Incomplete  Culture, blood (Routine X 2) w Reflex to ID Panel     Status: None (Preliminary result)   Collection Time: 08/21/22  8:58 PM   Specimen: BLOOD  Result Value Ref Range Status   Specimen Description BLOOD LEFT WRIST  Final   Special Requests   Final     BOTTLES DRAWN AEROBIC AND ANAEROBIC Blood Culture adequate volume   Culture   Final    NO GROWTH < 12 HOURS Performed at [facility], [address] [place], [place] [address]    Report Status PENDING  Incomplete  Culture, blood (Routine X 2) w Reflex to ID Panel  Status: None (Preliminary result)   Collection Time: 08/21/22 11:01 PM   Specimen: BLOOD  Result Value Ref Range Status   Specimen Description BLOOD BLOOD LEFT ARM  Final   Special Requests   Final    BOTTLES DRAWN AEROBIC AND ANAEROBIC Blood Culture adequate volume   Culture   Final    NO GROWTH < 12 HOURS Performed at [facility], [address] [place], [place] [address]    Report Status PENDING  Incomplete  Expectorated Sputum Assessment w Gram Stain, Rflx to Resp Cult     Status: None   Collection Time: 08/22/22 11:22 AM   Specimen: Sputum  Result Value Ref Range Status   Specimen Description SPUTUM  Final   Special Requests EXPSU  Final   Sputum evaluation   Final    THIS SPECIMEN IS ACCEPTABLE FOR SPUTUM CULTURE Performed at [facility], [address] [place], [place] [address]    Report Status 08/22/2022 FINAL  Final    IMAGING RESULTS:  I have personally reviewed the films  b/l interstitial infitrate   Impression/Recommendation Acute hypoxic respiratory failure with b/l infiltrates D.D CHF   Drug induced pneumonitis R/o pulmonary hemorrhage Bacteremia pneumonia unlikely Procalcitonin of 0.28 not significant  Viral pneumonia possible- Resp panel PCR sent Will check KL6 - lab test for ILD/drug induced pneumonitis   Recent MSSA bacteremia L1-L2 discitis Brett Riley-  On cefazolin TEE was negative in May 2024 Blood culture from 6/19  Encephalopathy- meds, hypoxia  Recent atherosclerotic ulcer infra renal abdominal aorta     ___________________________________________________ Discussed with patient, requesting provider Note:  This document was prepared  using Dragon voice recognition software and may include unintentional dictation errors.

## 2022-08-22 NOTE — Progress Notes (Signed)
       CROSS COVER NOTE  NAME: OSRIC COUPLAND MRN: [id number] DOB : 08-Jan-1936    Concern as stated by nurse / staff     Patient placed on BIPAP just prior to shift start for work of breathing. He was made NPO. Request for one time dose oral pred to be changed to Iv and oral aspirin to consider PR but has rectal bleed concern   Pertinent findings on chart review:   Assessment and  Interventions   Assessment:  Plan: Solumedrol 40 mg z1 Hold tonights dose of aspirin       Donnie Mesa NP [facility] Cross Cover 7pm-7am - check amion for availability Pager [phone]

## 2022-08-22 NOTE — Consult Note (Signed)
NAME: Brett Riley  DOB: 1935/06/07  MRN: [id number]  Date/Time: 08/22/2022 2:08 PM  REQUESTING PROVIDER: Dr.Hall Subjective:  REASON FOR CONSULT: pneumonia ?pt does not give any history,family at bed side and chart reviewed Brett Riley is a 87 y.o. 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 A suprarenal aortic extension cuff was also placed of 25mm* 80mm  On On 5/30 he developed rt knee pain and effusion and increased weakness and had a fever of 102.7. Blood culture 4/4 MSSA  MRI of lumbar spine L-L2 discitis Brett Riley and small ventral phlegmon L2-L3 left facet joint septic arthritis ? L4-L5/L5-s1 disicits. Assessed by neurosurgeon and surgery was not deemed necessary- assessed by ortho for rt knee pain and they did nit think it was septic arthritis He also had afib during that hospitalization. TEE neg for endocarditis He was discharged to SNF on 6/7/ on cefazolin until 09/13/22 He came to the ED on 6/16 brought in by EMS for resp distress SNF- sats were 84%  HE was initially on CPAP and then BIPAP In the ED he also had sputum which was pink colored  He had a fever of 100.9 Procal < 0.10 Increasing troponin CXR b/l infiltrrate suggestive of CHF.  Was seen by cardiology He was diuresed but that increased his creatinine He continued to be short of breath I am asked to see the patient to rule out pneumonia Pulmonology has also been consulted. He had CT chest and it showed b/l infiltrates especially of the upper lobe and also ilteral pleural effusion. He went for pleural fluid aspiration but he desaturated when turned on the side and hence it was abandoned Amiodarone was stopped by cardiology  yesterday with concern that it may have caused pneumonitis Resp panel PCR was sent yesterday    Past Medical History:  Diagnosis Date   Cancer (HCC)    skin cancer basal   Carotid arterial disease (HCC)    a. 02/2016 L CEA; b. 01/2017 U/S: patent LICA, 1-39% RICA.   Celiac artery stenosis (HCC)    Coronary artery disease    a. 01/2016 MV: mild apical/basal inferior, apical lateral, mid anterolateral, and mid inferolateral ischemia. EF 57%; b. 02/2016 Cath: LM 40ost, LAD 70p/m, 20d, D1 95 (small), LCX nl, RCA 29m, RPDA 90 (small), EF 55-65%-->med Rx. Rec CABG for recurrent symptoms.   Hearing loss    History of echocardiogram    a. 02/2016 Echo: EF 50-55%, no rwma, mild MR.   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   Osteoarthritis, hip, bilateral    Left > Right    Past Surgical History:  Procedure Laterality Date   AORTIC INTERVENTION N/A 07/24/2022   Procedure: AORTIC INTERVENTION;  Surgeon: Renford Dills, MD;  Location: [facility];  Service: Cardiovascular;  Laterality: N/A;   CARDIAC CATHETERIZATION N/A 01/19/2016   Procedure: Left Heart Cath and Coronary Angiography;  Surgeon: Iran Ouch, MD;  Location: [facility];  Service: Cardiovascular;  Laterality: N/A;   COLONOSCOPY     CYSTOSCOPY/URETEROSCOPY/HOLMIUM LASER/STENT PLACEMENT Right 04/13/2021   Procedure: CYSTOSCOPY/URETEROSCOPY/HOLMIUM LASER/STENT PLACEMENT;  Surgeon: Sondra Come, MD;  Location: [facility] OR[facility];  Service: Urology;  Laterality: Right;   ENDARTERECTOMY Left 02/15/2016   Procedure: ENDARTERECTOMY CAROTID;  Surgeon: Annice Needy, MD;  Location: [facility] OR[facility];  Service: Vascular;  Laterality: Left;   TEE WITHOUT CARDIOVERSION N/A 08/05/2022   Procedure: TRANSESOPHAGEAL ECHOCARDIOGRAM;  Surgeon: Debbe Odea, MD;  Location: [facility] OR[facility];  Service: Cardiovascular;  Laterality: N/A;    Social History    Socioeconomic History   Marital status: Married    Spouse name: Not on file   Number of children: 1   Years of education: Not on file   Highest education level: Not on file  Occupational History    Employer: [employer]  Tobacco Use   Smoking status: Never   Smokeless tobacco: Never  Vaping Use   Vaping Use: Never used  Substance and Sexual Activity   Alcohol use: No    Alcohol/week: 0.0 standard drinks of alcohol   Drug use: No   Sexual activity: Not Currently  Other Topics Concern   Not on file  Social History Narrative   Not on file   Social Determinants of Health   Financial Resource Strain: Low Risk  (08/29/2020)   Overall Financial Resource Strain (CARDIA)    Difficulty of Paying Living Expenses: Not hard at all  Food Insecurity: No Food Insecurity (08/18/2022)   Hunger Vital Sign    Worried About Running Out of Food in the Last Year: Never true    Ran Out of Food in the Last Year: Never true  Transportation Needs: No Transportation Needs (08/18/2022)   PRAPARE - Administrator, Civil Service (Medical): No    Lack of Transportation (Non-Medical): No  Physical Activity: Insufficiently Active (08/29/2020)   Exercise Vital Sign    Days of Exercise per Week: 3 days    Minutes of Exercise per Session: 20 min  Stress: No Stress Concern Present (08/29/2020)   [employer] of Occupational Health - Occupational Stress Questionnaire    Feeling of Stress : Not at all  Social Connections: Unknown (08/29/2020)   Social Connection and Isolation Panel [NHANES]    Frequency of Communication with Friends and Family: Not on file    Frequency of Social Gatherings with Friends and Family: Not on file    Attends Religious Services: Not on file    Active Member of Clubs or Organizations: Not on file    Attends Banker Meetings: Not on file    Marital Status: Married  Intimate Partner Violence: Not At Risk (08/18/2022)   Humiliation, Afraid, Rape, and  Kick questionnaire    Fear of Current or Ex-Partner: No    Emotionally Abused: No    Physically Abused: No    Sexually Abused: No    Family History  Problem Relation Age of Onset   Stroke Mother    Heart disease Father        MI   Heart attack Father    Breast cancer Sister    Colon cancer Neg Hx    Prostate cancer Neg Hx    No Known Allergies I? Current Facility-Administered Medications  Medication Dose Route Frequency Provider Last Rate Last Admin   acetaminophen (TYLENOL) tablet 650 mg  650 mg Oral Q6H PRN Lorretta Harp, MD       albuterol (PROVENTIL) (2.5 MG/3ML) 0.083% nebulizer solution 2.5 mg  2.5 mg Nebulization Q4H PRN Lorretta Harp, MD       aspirin EC tablet 81 mg  81 mg Oral QHS Lorretta Harp, MD   81 mg at 08/20/22 2121   azithromycin (ZITHROMAX) 500 mg in sodium chloride 0.9 % 250 mL IVPB  500 mg Intravenous q1800 Sunnie Nielsen, DO 250 mL/hr at 08/21/22 1903 500 mg at 08/21/22 1903   ceFAZolin (ANCEF) IVPB 2g/100 mL premix  2 g Intravenous Q8H Ronnald Ramp, RPH 200 mL/hr at 08/22/22 1345 2 g at 08/22/22 1345   Chlorhexidine Gluconate Cloth 2 % PADS 6 each  6 each Topical Daily Lorretta Harp, MD   6 each at 08/22/22 1100   cyanocobalamin (VITAMIN B12) tablet 1,000 mcg  1,000 mcg Oral Daily Lorretta Harp, MD   1,000 mcg at 08/22/22 1023   dextromethorphan-guaiFENesin (MUCINEX DM) 30-600 MG per 12 hr tablet 1 tablet  1 tablet Oral BID PRN Lorretta Harp, MD       diltiazem (CARDIZEM CD) 24 hr capsule 420 mg  420 mg Oral Daily Sunnie Nielsen, DO   420 mg at 08/21/22 0908   feeding supplement (ENSURE ENLIVE / ENSURE PLUS) liquid 237 mL  237 mL Oral BID BM Lorretta Harp, MD   237 mL at 08/21/22 1416   hydrALAZINE (APRESOLINE) injection 5 mg  5 mg Intravenous Q2H PRN Lorretta Harp, MD       metoprolol succinate (TOPROL-XL) 24 hr tablet 100 mg  100 mg Oral Daily Lorretta Harp, MD   100 mg at 08/21/22 0908   multivitamins with iron tablet 1 tablet  1 tablet Oral Daily Sunnie Nielsen, DO   1  tablet at 08/22/22 1026   ondansetron (ZOFRAN) injection 4 mg  4 mg Intravenous Q8H PRN Lorretta Harp, MD       pantoprazole (PROTONIX) EC tablet 40 mg  40 mg Oral Daily Lorretta Harp, MD   40 mg at 08/22/22 1023   polyethylene glycol (MIRALAX / GLYCOLAX) packet 17 g  17 g Oral Daily Lorretta Harp, MD   17 g at 08/20/22 0929   rosuvastatin (CRESTOR) tablet 40 mg  40 mg Oral Daily Mansy, Jan A, MD   40 mg at 08/22/22 1024   senna (SENOKOT) tablet 8.6 mg  1 tablet Oral Daily Lorretta Harp, MD   8.6 mg at 08/22/22 1023   simethicone (MYLICON) chewable tablet 80 mg  80 mg Oral QID PRN Mansy, Jan A, MD   80 mg at 08/20/22 2121   sodium chloride (OCEAN) 0.65 % nasal spray 1 spray  1 spray Each Nare PRN Sunnie Nielsen, DO   1 spray at 08/20/22 0448   traMADol (ULTRAM) tablet 50 mg  50 mg Oral Q6H PRN Lorretta Harp, MD   50 mg at 08/20/22 1801     Abtx:  Anti-infectives (From admission, onward)    Start     Dose/Rate Route Frequency Ordered Stop   08/21/22 1815  azithromycin (ZITHROMAX) 500 mg in sodium chloride 0.9 % 250 mL IVPB        500 mg 250 mL/hr over 60 Minutes Intravenous Daily-1800 08/21/22 1807     08/18/22 1400  ceFAZolin (ANCEF) IVPB 2g/100 mL premix       Note to Pharmacy: Indication:  Vertebral osteomyelitis  First Dose: Yes Last Day of Therapy:  09/13/22  Labs - Once weekly (Monday):  CBC/D and CMP, Labs - Once weekly (Monday): ESR and CRP Fax weekly lab results  promptly to [phone] Method of administration: IV Push- Please pull PICC at completion of IV   2 g 200 mL/hr over 30 Minutes Intravenous  Every 8 hours 08/18/22 1319 09/13/22 2359   08/18/22 1115  vancomycin (VANCOCIN) IVPB 1000 mg/200 mL premix        1,000 mg 200 mL/hr over 60 Minutes Intravenous  Once 08/18/22 1107 08/18/22 1322   08/18/22 1115  ceFEPIme (MAXIPIME) 2 g in sodium chloride 0.9 % 100 mL IVPB        2 g 200 mL/hr over 30 Minutes Intravenous  Once 08/18/22 1107 08/18/22 1211       REVIEW OF SYSTEMS:   NA He is somnolent but easily arousable and says he is okay: Objective:  VITALS:  BP 128/62 (BP Location: Left Arm)   Pulse 73   Temp 97.9 F (36.6 C)   Resp 16   Ht 6' (1.829 m)   Wt 89.5 kg   SpO2 99%   BMI 26.76 kg/m   PHYSICAL EXAM:  General: Pt is somnolent , but can be easily arousable. Able to say that he is okay but cannot sustain his attention Head: Normocephalic, without obvious abnormality, atraumatic. Eyes: Conjunctivae clear, anicteric sclerae. Pupils are equal Neck: symmetrical, no adenopathy, thyroid: non tender no carotid bruit and no JVD. Lungs: /l air entry crepts both sides Heart: irregular Abdomen: Soft, non-tender,not distended. Bowel sounds normal. No masses Extremities: some edema ankles Able to passively move both knees/legs Skin: No rashes or lesions. Or bruising Lymph: Cervical, supraclavicular normal. Neurologic: cannot assess in detail Pertinent Labs Lab Results CBC    Component Value Date/Time   WBC 10.4 08/22/2022 0505   RBC 2.69 (L) 08/22/2022 0505   HGB 8.0 (L) 08/22/2022 0505   HCT 25.3 (L) 08/22/2022 0505   PLT 241 08/22/2022 0505   MCV 94.1 08/22/2022 0505   MCH 29.7 08/22/2022 0505   MCHC 31.6 08/22/2022 0505   RDW 14.2 08/22/2022 0505   LYMPHSABS 0.9 08/22/2022 0505   MONOABS 0.1 08/22/2022 0505   EOSABS 0.0 08/22/2022 0505   BASOSABS 0.0 08/22/2022 0505       Latest Ref Rng & Units 08/22/2022    5:05 AM 08/21/2022    6:19 AM 08/20/2022    4:00 PM  CMP  Glucose 70 - 99 mg/dL 161  096    BUN 8 - 23 mg/dL 55  43    Creatinine 0.45 - 1.24 mg/dL 4.09  8.11    Sodium 914 - 145 mmol/L 141  140    Potassium 3.5 - 5.1 mmol/L 3.7  3.3  3.6   Chloride 98 - 111 mmol/L 105  102    CO2 22 - 32 mmol/L 27  26    Calcium 8.9 - 10.3 mg/dL 8.0  8.0        Microbiology: Recent Results (from the past 240 hour(s))  Blood culture (routine x 2)     Status: None (Preliminary result)   Collection Time: 08/18/22 10:45 AM   Specimen:  BLOOD LEFT ARM  Result Value Ref Range Status   Specimen Description BLOOD LEFT ARM  Final   Special Requests   Final    BOTTLES DRAWN AEROBIC AND ANAEROBIC Blood Culture adequate volume   Culture   Final    NO GROWTH 4 DAYS Performed at [facility], [address] [place], [place] [address]    Report Status PENDING  Incomplete  Resp Panel by RT-PCR (Flu A&B, Covid) Anterior Nasal Swab     Status: None   Collection Time: 08/18/22 10:46 AM   Specimen: Anterior Nasal Swab  Result Value Ref Range Status  SARS Coronavirus 2 by RT PCR NEGATIVE NEGATIVE Final    Comment: (NOTE) SARS-CoV-2 target nucleic acids are NOT DETECTED.  The SARS-CoV-2 RNA is generally detectable in upper respiratory specimens during the acute phase of infection. The lowest concentration of SARS-CoV-2 viral copies this assay can detect is 138 copies/mL. A negative result does not preclude SARS-Cov-2 infection and should not be used as the sole basis for treatment or other patient management decisions. A negative result may occur with  improper specimen collection/handling, submission of specimen other than nasopharyngeal swab, presence of viral mutation(s) within the areas targeted by this assay, and inadequate number of viral copies(<138 copies/mL). A negative result must be combined with clinical observations, patient history, and epidemiological information. The expected result is Negative.  Fact Sheet for Patients:  BloggerCourse.com  Fact Sheet for Healthcare Providers:  SeriousBroker.it  This test is no t yet approved or cleared by the [place] FDA and  has been authorized for detection and/or diagnosis of SARS-CoV-2 by FDA under an Emergency Use Authorization (EUA). This EUA will remain  in effect (meaning this test can be used) for the duration of the COVID-19 declaration under Section 564(b)(1) of the Act, 21 U.S.C.section  360bbb-3(b)(1), unless the authorization is terminated  or revoked sooner.       Influenza A by PCR NEGATIVE NEGATIVE Final   Influenza B by PCR NEGATIVE NEGATIVE Final    Comment: (NOTE) The Xpert Xpress SARS-CoV-2/FLU/RSV plus assay is intended as an aid in the diagnosis of influenza from Nasopharyngeal swab specimens and should not be used as a sole basis for treatment. Nasal washings and aspirates are unacceptable for Xpert Xpress SARS-CoV-2/FLU/RSV testing.  Fact Sheet for Patients: BloggerCourse.com  Fact Sheet for Healthcare Providers: SeriousBroker.it  This test is not yet approved or cleared by the [place] FDA and has been authorized for detection and/or diagnosis of SARS-CoV-2 by FDA under an Emergency Use Authorization (EUA). This EUA will remain in effect (meaning this test can be used) for the duration of the COVID-19 declaration under Section 564(b)(1) of the Act, 21 U.S.C. section 360bbb-3(b)(1), unless the authorization is terminated or revoked.  Performed at [facility], [address] [place], [place] [address]   Culture, blood (Routine X 2) w Reflex to ID Panel     Status: None (Preliminary result)   Collection Time: 08/18/22  9:19 PM   Specimen: BLOOD LEFT ARM  Result Value Ref Range Status   Specimen Description BLOOD LEFT ARM  Final   Special Requests   Final    BOTTLES DRAWN AEROBIC AND ANAEROBIC Blood Culture adequate volume   Culture   Final    NO GROWTH 4 DAYS Performed at [facility], [address], [place], [place] [address]    Report Status PENDING  Incomplete  Culture, blood (Routine X 2) w Reflex to ID Panel     Status: None (Preliminary result)   Collection Time: 08/21/22  8:58 PM   Specimen: BLOOD  Result Value Ref Range Status   Specimen Description BLOOD LEFT WRIST  Final   Special Requests   Final    BOTTLES DRAWN AEROBIC AND ANAEROBIC Blood Culture  adequate volume   Culture   Final    NO GROWTH < 12 HOURS Performed at [facility], [address] [place], [place] [address]    Report Status PENDING  Incomplete  Culture, blood (Routine X 2) w Reflex to ID Panel     Status: None (Preliminary  result)   Collection Time: 08/21/22 11:01 PM   Specimen: BLOOD  Result Value Ref Range Status   Specimen Description BLOOD BLOOD LEFT ARM  Final   Special Requests   Final    BOTTLES DRAWN AEROBIC AND ANAEROBIC Blood Culture adequate volume   Culture   Final    NO GROWTH < 12 HOURS Performed at [facility] [address] [place], [place] [address]    Report Status PENDING  Incomplete  Expectorated Sputum Assessment w Gram Stain, Rflx to Resp Cult     Status: None   Collection Time: 08/22/22 11:22 AM   Specimen: Sputum  Result Value Ref Range Status   Specimen Description SPUTUM  Final   Special Requests EXPSU  Final   Sputum evaluation   Final    THIS SPECIMEN IS ACCEPTABLE FOR SPUTUM CULTURE Performed at [facility], [address] [place], [place] [address]    Report Status 08/22/2022 FINAL  Final    IMAGING RESULTS:  I have personally reviewed the films ?b/l interstitial infitrate   Impression/Recommendation Acute hypoxic respiratory failure with b/l infiltrates D.D CHF ? Drug induced pneumonitis R/o pulmonary hemorrhage Bacteremia pneumonia unlikely Procalcitonin of 0.28 not significant  Viral pneumonia possible- Resp panel PCR sent Will check KL6 - lab test for ILD/drug induced pneumonitis  Recent MSSA bacteremia L1-L2 discitis Brett Riley-  On cefazolin TEE was negative in May 2024 Blood culture from 6/16 and 6/19 Ng  Encephalopathy- meds, hypoxia  Recent atherosclerotic ulcer infra renal abdominal aorta s/p endovascular procedure and placement of endoprosthesis  AKI on CKD  CAD Worsening troponin  Afib On diltiazem  Discussed with family and care team Note:  This document  was prepared using Dragon voice recognition software and may include unintentional dictation errors.

## 2022-08-22 NOTE — Progress Notes (Signed)
Bladder scan Done 319cc, MD informed

## 2022-08-22 NOTE — TOC Progression Note (Signed)
Transition of Care [facility]) - Progression Note    Patient Details  Name: Brett Riley MRN: [id number] Date of Birth: [birth year]  Transition of [facility]) CM/SW Contact  Truddie Hidden, RN Phone Number: 08/22/2022, 3:44 PM  Clinical Narrative:    TOC assessing for ongoing needs and discharge planning.        Expected Discharge Plan and Services                                               Social Determinants of Health (SDOH) Interventions SDOH Screenings   Food Insecurity: No Food Insecurity (08/18/2022)  Housing: Low Risk  (08/18/2022)  Transportation Needs: No Transportation Needs (08/18/2022)  Utilities: Not At Risk (08/18/2022)  Depression (PHQ2-9): Low Risk  (06/06/2022)  Financial Resource Strain: Low Risk  (08/29/2020)  Physical Activity: Insufficiently Active (08/29/2020)  Social Connections: Unknown (08/29/2020)  Stress: No Stress Concern Present (08/29/2020)  Tobacco Use: Low Risk  (08/18/2022)    Readmission Risk Interventions     No data to display

## 2022-08-22 NOTE — Progress Notes (Signed)
Progress Note  Patient Name: Brett Riley Date of Encounter: 08/22/2022  Primary Cardiologist: Kirke Corin  Subjective   Remains on nasal cannula oxygen, high flow, saturations in the 90s Received dose IV Lasix yesterday afternoon Attempted paracentesis/thoracentesis unsuccessful, was unable to sit for procedure, developed hypoxia when laid on his side and procedure canceled saturations in the 60s  Blood pressure 120 systolic stable, heart rate 70s Continues to cough up scant phlegm  Inpatient Medications    Scheduled Meds:  aspirin EC  81 mg Oral QHS   Chlorhexidine Gluconate Cloth  6 each Topical Daily   cyanocobalamin  1,000 mcg Oral Daily   diltiazem  420 mg Oral Daily   feeding supplement  237 mL Oral BID BM   lisinopril  40 mg Oral Daily   metoprolol succinate  100 mg Oral Daily   multivitamins with iron  1 tablet Oral Daily   pantoprazole  40 mg Oral Daily   polyethylene glycol  17 g Oral Daily   rosuvastatin  40 mg Oral Daily   senna  1 tablet Oral Daily   Continuous Infusions:  azithromycin 500 mg (08/21/22 1903)   ceFAZolin 2 g (08/22/22 0505)   PRN Meds: acetaminophen, albuterol, dextromethorphan-guaiFENesin, hydrALAZINE, ondansetron (ZOFRAN) IV, simethicone, sodium chloride, traMADol   Vital Signs    Vitals:   08/22/22 0814 08/22/22 0900 08/22/22 1000 08/22/22 1100  BP: 117/65 (!) 113/59 (!) 75/65 129/75  Pulse: 66     Resp: 16     Temp: (!) 97.5 F (36.4 C)     TempSrc: Oral     SpO2: 94% 100% 95%   Weight:      Height:        Intake/Output Summary (Last 24 hours) at 08/22/2022 1137 Last data filed at 08/21/2022 2309 Gross per 24 hour  Intake 326.68 ml  Output 1150 ml  Net -823.32 ml   Filed Weights   08/18/22 2115 08/20/22 0321 08/21/22 0437  Weight: 91.9 kg 90 kg 89.5 kg    Telemetry    Normal sinus rhythm- Personally Reviewed  ECG    No new tracings - Personally Reviewed  Physical Exam   GEN: No acute distress.   Neck: No  JVD. Cardiac: RRR, no murmurs, rubs, or gallops.  Respiratory: Scattered wheezing, Rales on the left, dullness at the bases bilaterally GI: Soft, nontender, non-distended.   MS: No edema; No deformity. Neuro:  Alert and oriented x 3; Nonfocal.  Psych: Normal affect.  Labs    Chemistry Recent Labs  Lab 08/20/22 0540 08/20/22 1600 08/21/22 0619 08/22/22 0505  NA 135  --  140 141  K 2.7* 3.6 3.3* 3.7  CL 99  --  102 105  CO2 24  --  26 27  GLUCOSE 96  --  115* 139*  BUN 37*  --  43* 55*  CREATININE 1.64*  --  1.90* 2.28*  CALCIUM 7.8*  --  8.0* 8.0*  GFRNONAA 41*  --  34* 28*  ANIONGAP 12  --  12 9     Hematology Recent Labs  Lab 08/20/22 0540 08/20/22 1830 08/21/22 0619 08/22/22 0505  WBC 9.8  --  12.0*  11.9* 10.4  RBC 2.56*  --  3.00*  3.01* 2.69*  HGB 7.7* 9.0* 9.1*  9.1* 8.0*  HCT 24.0* 27.5* 27.9*  27.9* 25.3*  MCV 93.8  --  93.0  92.7 94.1  MCH 30.1  --  30.3  30.2 29.7  MCHC 32.1  --  32.6  32.6 31.6  RDW 13.8  --  14.2  14.1 14.2  PLT 285  --  284  267 241    Cardiac EnzymesNo results for input(s): "TROPONINI" in the last 168 hours. No results for input(s): "TROPIPOC" in the last 168 hours.   BNP Recent Labs  Lab 08/18/22 1020 08/21/22 1655  BNP 279.2* 673.3*     DDimer No results for input(s): "DDIMER" in the last 168 hours.   Radiology    CT CHEST WO CONTRAST  Result Date: 08/21/2022 IMPRESSION: Extensive bilateral upper and lower lobe interstitial opacities are noted most consistent with pulmonary edema or atypical infection. Moderate size bilateral pleural effusions are noted as well. 3.0 x 1.3 cm penetrating atherosclerotic ulcer or focal saccular aneurysm is seen involving distal portion of ascending thoracic aortic aneurysm. Extensive coronary artery calcifications are noted suggesting coronary artery disease. Aortic Atherosclerosis (ICD10-I70.0). Electronically Signed   By: Lupita Raider M.D.   On: 08/21/2022 14:21    Cardiac  Studies   Limited echo 08/19/2022: 1. Left ventricular ejection fraction, by estimation, is 50 to 55%. The  left ventricle has low normal function. Left ventricular endocardial  border not optimally defined to evaluate regional wall motion.   2. Right ventricular systolic function is normal.  __________  TEE 08/05/2022: 1. Left ventricular ejection fraction, by estimation, is 55 to 60%. The  left ventricle has normal function.   2. Right ventricular systolic function is normal. The right ventricular  size is normal.   3. No left atrial/left atrial appendage thrombus was detected.   4. The mitral valve is normal in structure. Mild mitral valve  regurgitation.   5. The aortic valve is tricuspid. Aortic valve regurgitation is trivial.   Patient Profile     87 y.o. 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   Assessment & Plan    Acute respiratory failure Concern for pneumonia, possibly multi lobar,  Pulmonary following Unable to sit for thoracentesis and procedure canceled for hypoxia when laying on his side CT scan with interstitial opacities, concerning for atypical infection especially in light of worsening renal function with diuresis, less likely pulmonary edema   Acute on chronic HFpEF  shortness of breath on admission -Limited echocardiogram revealed LVEF of 50-55% with RV systolic function is normal -Treated with IV Lasix, now with significant climbing creatinine, no dramatic improvement in respiratory status, -5 L Given additional dose IV Lasix yesterday afternoon by hospitalist service, worsening renal function now creatinine 2.2 -Lasix on hold   3. NSTEMI versus demand  ischemia  Likely supply/demand mismatch in the setting of pneumonia ,  fever, infection, anemia -High-sensitivity troponins  peaked at 1775 -Denies significant symptoms concerning for ischemia, no angina -No ischemic changes noted on EKG -Heparin infusion had to be previously held for rectal bleeding -No plans currently for invasive evaluation with anemia and heart failure exacerbation   4.Symptomatic anemia -Hemoglobin 8.0 after blood given, has received transfusion but continues to trend down -Heparin infusion  stopped due to rectal bleeding    6Primary hypertension -Continued on diltiazem, furosemide, lisinopril, Toprol-XL   7Hyperlipidemia -Continued on rosuvastatin   8Paroxysmal atrial fibrillation -Currently maintaining sinus rhythm Off amiodarone in the setting of underlying lung disease, previously on  200 twice daily Continue diltiazem and Toprol-XL -Apixaban was placed on hold while he was on heparin infusion, heparin infusion had to be discontinued due to rectal bleeding   9Chronic kidney disease stage IIIa -Serum creatinine 2.2, getting worse with diuresis Lasix held   10 Recent history of MSSA bacteremia with vertebral osteomyelitis -Continued on IV antibiotic therapy -Continued management per IM     Total encounter time more than 50 minutes  Greater than 50% was spent in counseling and coordination of care with the patient    For questions or updates, please contact [facility] Please consult [email or web address] for contact info under Cardiology/STEMI.    Signed, Dossie Arbour, MD, Ph.D [facility]

## 2022-08-23 ENCOUNTER — Inpatient Hospital Stay: Payer: Medicare HMO

## 2022-08-23 ENCOUNTER — Other Ambulatory Visit: Payer: Medicare HMO

## 2022-08-23 DIAGNOSIS — J9601 Acute respiratory failure with hypoxia: Secondary | ICD-10-CM | POA: Diagnosis not present

## 2022-08-23 DIAGNOSIS — T462X5A Adverse effect of other antidysrhythmic drugs, initial encounter: Secondary | ICD-10-CM

## 2022-08-23 DIAGNOSIS — R7881 Bacteremia: Secondary | ICD-10-CM | POA: Diagnosis not present

## 2022-08-23 DIAGNOSIS — I5033 Acute on chronic diastolic (congestive) heart failure: Secondary | ICD-10-CM | POA: Diagnosis not present

## 2022-08-23 DIAGNOSIS — J984 Other disorders of lung: Secondary | ICD-10-CM

## 2022-08-23 DIAGNOSIS — J189 Pneumonia, unspecified organism: Secondary | ICD-10-CM | POA: Diagnosis not present

## 2022-08-23 DIAGNOSIS — A419 Sepsis, unspecified organism: Secondary | ICD-10-CM | POA: Diagnosis not present

## 2022-08-23 DIAGNOSIS — I480 Paroxysmal atrial fibrillation: Secondary | ICD-10-CM | POA: Diagnosis not present

## 2022-08-23 DIAGNOSIS — J90 Pleural effusion, not elsewhere classified: Secondary | ICD-10-CM

## 2022-08-23 LAB — CULTURE, BLOOD (ROUTINE X 2)
Culture: NO GROWTH
Culture: NO GROWTH
Special Requests: ADEQUATE
Special Requests: ADEQUATE

## 2022-08-23 LAB — URINALYSIS, COMPLETE (UACMP) WITH MICROSCOPIC
Bilirubin Urine: NEGATIVE
Glucose, UA: NEGATIVE mg/dL
Ketones, ur: NEGATIVE mg/dL
Leukocytes,Ua: NEGATIVE
Nitrite: NEGATIVE
Protein, ur: 100 mg/dL — AB
Specific Gravity, Urine: 1.015 (ref 1.005–1.030)
pH: 5 (ref 5.0–8.0)

## 2022-08-23 LAB — RENAL FUNCTION PANEL
Albumin: 2.1 g/dL — ABNORMAL LOW (ref 3.5–5.0)
Anion gap: 10 (ref 5–15)
BUN: 64 mg/dL — ABNORMAL HIGH (ref 8–23)
CO2: 27 mmol/L (ref 22–32)
Calcium: 7.9 mg/dL — ABNORMAL LOW (ref 8.9–10.3)
Chloride: 103 mmol/L (ref 98–111)
Creatinine, Ser: 2.44 mg/dL — ABNORMAL HIGH (ref 0.61–1.24)
GFR, Estimated: 25 mL/min — ABNORMAL LOW
Glucose, Bld: 115 mg/dL — ABNORMAL HIGH (ref 70–99)
Phosphorus: 3.4 mg/dL (ref 2.5–4.6)
Potassium: 3.4 mmol/L — ABNORMAL LOW (ref 3.5–5.1)
Sodium: 140 mmol/L (ref 135–145)

## 2022-08-23 LAB — RESPIRATORY PANEL BY PCR

## 2022-08-23 LAB — FERRITIN: Ferritin: 823 ng/mL — ABNORMAL HIGH (ref 24–336)

## 2022-08-23 LAB — PROCALCITONIN: Procalcitonin: 0.19 ng/mL

## 2022-08-23 LAB — CBC
HCT: 27.7 % — ABNORMAL LOW (ref 39.0–52.0)
Hemoglobin: 9 g/dL — ABNORMAL LOW (ref 13.0–17.0)
MCH: 30.5 pg (ref 26.0–34.0)
MCHC: 32.5 g/dL (ref 30.0–36.0)
MCV: 93.9 fL (ref 80.0–100.0)
Platelets: 302 10*3/uL (ref 150–400)
RBC: 2.95 MIL/uL — ABNORMAL LOW (ref 4.22–5.81)
RDW: 14.1 % (ref 11.5–15.5)
WBC: 12.4 10*3/uL — ABNORMAL HIGH (ref 4.0–10.5)
nRBC: 0 % (ref 0.0–0.2)

## 2022-08-23 LAB — PROTEIN / CREATININE RATIO, URINE
Creatinine, Urine: 74 mg/dL
Protein Creatinine Ratio: 1.8 mg/mg{creat} — ABNORMAL HIGH (ref 0.00–0.15)
Total Protein, Urine: 133 mg/dL

## 2022-08-23 LAB — CK: Total CK: 31 U/L — ABNORMAL LOW (ref 49–397)

## 2022-08-23 LAB — MAGNESIUM: Magnesium: 2.2 mg/dL (ref 1.7–2.4)

## 2022-08-23 MED ORDER — IPRATROPIUM-ALBUTEROL 0.5-2.5 (3) MG/3ML IN SOLN
3.0000 mL | Freq: Once | RESPIRATORY_TRACT | Status: AC
  Administered 2022-08-23: 3 mL via RESPIRATORY_TRACT
  Filled 2022-08-23: qty 3

## 2022-08-23 MED ORDER — POTASSIUM CHLORIDE 20 MEQ PO PACK
20.0000 meq | PACK | Freq: Once | ORAL | Status: AC
  Administered 2022-08-23: 20 meq via ORAL
  Filled 2022-08-23: qty 1

## 2022-08-23 MED ORDER — DEXAMETHASONE SODIUM PHOSPHATE 10 MG/ML IJ SOLN
8.0000 mg | INTRAMUSCULAR | Status: DC
  Administered 2022-08-23 – 2022-08-25 (×3): 8 mg via INTRAVENOUS
  Filled 2022-08-23 (×3): qty 1

## 2022-08-23 MED ORDER — CEFAZOLIN SODIUM-DEXTROSE 2-4 GM/100ML-% IV SOLN
2.0000 g | Freq: Two times a day (BID) | INTRAVENOUS | Status: DC
  Administered 2022-08-23 – 2022-09-12 (×40): 2 g via INTRAVENOUS
  Filled 2022-08-23 (×41): qty 100

## 2022-08-23 MED ORDER — MELATONIN 5 MG PO TABS
5.0000 mg | ORAL_TABLET | Freq: Every evening | ORAL | Status: AC | PRN
  Administered 2022-08-24: 5 mg via ORAL
  Filled 2022-08-23: qty 1

## 2022-08-23 NOTE — Progress Notes (Addendum)
PROGRESS NOTE  Brett Riley:[id number] DOB: 08-03-1935 DOA: 08/18/2022 PCP: Dale Viroqua, MD  HPI/Recap of past 24 hours: Brett Riley is a 87 y.o. male with medical history significant of chronic dCHF, HTN, HLD, CAD, CKD-3a, anemia, chronic A fib on Eliquis, who presents 08/18/2022 to ED from rehab, chief complaint SOB.  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 Net IO Since Admission: -5,549.14 mL [08/20/22 1608]. Heparin stopped d/t rectal bleeding overnight.  06/19: euvolemic per cardio, d/c lasix, O2 requirement still high, CT chest reviewed, ?edema/atypical infection, given clinically worsening respiratory status have asked pulmonary and ID to consult. Have d/c amiodarone and ordered steroids in case amio toxicity.  06/20: ID and pulmonary assisting with the management.  08/23/2022: Patient was seen and examined at his bedside.  His wife was present in the room.  Melatonin nightly as needed added.  Worsening renal function.  Nephrology consulted.       Assessment/Plan: Principal Problem:   Acute respiratory failure with hypoxia (HCC) Active Problems:   Acute on chronic heart failure with preserved ejection fraction (HFpEF) (HCC)   MSSA bacteremia   Vertebral osteomyelitis (HCC)   CAD (coronary artery disease)   Myocardial injury   Hypertension   Hyperlipidemia   Chronic kidney disease, stage 3a (HCC)   Normocytic anemia   Atrial fibrillation, chronic (HCC)   Paroxysmal atrial fibrillation (HCC)   HCAP (healthcare-associated pneumonia)   Sepsis (HCC)    Pleural effusion   Acute on chronic respiratory failure with hypoxia due to acute on chronic diastolic CHF (congestive heart failure), complicated by possible pneumonia, bilateral pleural effusion  Not on oxygen supplementation at baseline Currently on 6 L with O2 saturation in the mid to upper 90s. Sputum sample obtained, and bacteria Pulmonary and infectious disease following. Continue BiPAP nightly. Completed course of azithromycin. Continue Ancef.  Acute on chronic diastolic CHF 2D echo on 07/21/21 showed  EF of 55-60% with grade 1 diastolic dysfunction Limited echocardiogram revealed LVEF of 50-55% with RV systolic function is normal  BNP 673 on 08/21/22 Lasix was held due to worsening creatinine Continue strict I's and O's and daily weight.   Abnormal CT chest Concerning for pulmonary edema vs atypical infection Procalcitonin 0.28 BNP greater than 600 restarted abx 08/21/22 w/ borderline elevated procalcitonin and concern for atypical infectino on CT scan Stopped amiodarone in case of lung toxicity, DC prednisone Follow cultures.   Pleural effusion Right thoracentesis attempted on 08/23/2022 by IR, not enough fluid to remove Attempted on 08/22/2022 but unsuccessful, patient became severely hypoxic with position change.   Ascites on exam   Atrial fibrillation, chronic (HCC): Heart rate 97 ASA, diltiazem, lisinopril, metoprolol per cardiology Hold Eliquis given anemia  Stopped amiodarone in case of lung toxicity   Recent hx of MSSA bacteremia vertebral osteomyelitis (HCC): continue IV Ancef  repeat Blood culture NGx2d   CAD (coronary artery disease) and Myocardial injury:  trop  258, possibly due to demand ischemia secondary to CHF exacerbation.  Patient does not have chest pain. Trend troponin --> 1775 and heparin gtt started --> 1446 Heparin gtt held  d/t anemia  Crestor, beta blocker, Aspirin 81 mg daily  Cardiology following  May need coronary angiography at some  point but will defer for now     Hypertension IV hydralazine as needed Continue to closely monitor vital signs.   Hyperlipidemia Crestor   Resolved hypokalemia   GERD PPI   Chronic kidney disease, stage 3a (HCC):  Renal function stable.  Baseline creatinine 1.5-1.9 Follow BMP Avoiding fluids d/t CHF   Normocytic anemia:  ABLA w/ rectal bleed S/p 1 unit PRBC 06/18 Hemoglobin downtrending 8.0 from 9.1.   Resolved, leukocytosis. Potentially reactive vs infectious  Follow CBC, adding differential        DVT prophylaxis: SCDs Pertinent IV fluids/nutrition: None Central lines / invasive devices: PICC in R Brachial     Code Status: DNR, see H&P, discuss w/ pt/family if respiratory deterioration requiring intubation, no CPR ACP documentation reviewed: 08/19/22 - HCPOA w/ 1) Dava Najjar, alternates 2) Erling Conte, 3) Sandra Cockayne FIelds.    Current Admission Status: inpatient  TOC needs / Dispo plan: TBD Barriers to discharge / significant pending items: cardiology recs    Status is: Inpatient The patient requires at least 2 midnights for further evaluation and treatment of present condition.    Objective: Vitals:   08/23/22 1219 08/23/22 1300 08/23/22 1400 08/23/22 1512  BP: 127/79   133/65  Pulse: 74   73  Resp: (!) 22 19 (!) 22 17  Temp: 98.1 F (36.7 C)     TempSrc:      SpO2: 93%   98%  Weight:      Height:        Intake/Output Summary (Last 24 hours) at 08/23/2022 1629 Last data filed at 08/23/2022 1439 Gross per 24 hour  Intake 880 ml  Output 1150 ml  Net -270 ml   Filed Weights   08/20/22 0321 08/21/22 0437 08/23/22 0459  Weight: 90 kg 89.5 kg 90 kg    Exam:  General: 87 y.o. year-old male frail-appearing in no acute distress.  He is alert.  Hard of hearing. Cardiovascular: Irregular rate and rhythm no rubs or gallops. Respiratory: Mild diffuse rales bilaterally.  Poor inspiratory effort. Abdomen: Soft nontender nondistended with normal  bowel sounds x4 quadrants. Musculoskeletal: 2+ pitting edema in lower extremities bilaterally. Skin: No ulcerative lesions noted or rashes, Psychiatry: Mood is appropriate for condition and setting   Data Reviewed: CBC: Recent Labs  Lab 08/18/22 1020 08/19/22 0505 08/20/22 0540 08/20/22 1830 08/21/22 0619 08/22/22 0505 08/23/22 0516  WBC 10.8* 9.3 9.8  --  12.0*  11.9* 10.4 12.4*  NEUTROABS 8.9*  --   --   --  9.6* 9.3*  --   HGB 8.7* 7.6* 7.7* 9.0* 9.1*  9.1* 8.0* 9.0*  HCT 27.8* 24.1* 24.0* 27.5* 27.9*  27.9* 25.3* 27.7*  MCV 98.2 96.0 93.8  --  93.0  92.7 94.1 93.9  PLT 282 245 285  --  284  267 241 302   Basic Metabolic Panel: Recent Labs  Lab 08/18/22 1046 08/19/22 0505 08/20/22 0540 08/20/22 1600 08/21/22 0619 08/22/22 0505 08/23/22 0516  NA  --  140 135  --  140 141 140  K  --  3.4* 2.7* 3.6 3.3* 3.7 3.4*  CL  --  106 99  --  102 105 103  CO2  --  25 24  --  26 27 27   GLUCOSE  --  100* 96  --  115* 139* 115*  BUN  --  31* 37*  --  43* 55* 64*  CREATININE  --  1.56* 1.64*  --  1.90* 2.28* 2.44*  CALCIUM  --  7.9* 7.8*  --  8.0* 8.0* 7.9*  MG 2.0  --  1.7  --   --   --  2.2  PHOS  --   --   --   --   --   --  3.4   GFR: Estimated Creatinine Clearance: 24.7 mL/min (A) (by C-G formula based on SCr of 2.44 mg/dL (H)). Liver Function Tests: Recent Labs  Lab 08/23/22 0516  ALBUMIN 2.1*   No results for input(s): "LIPASE", "AMYLASE" in the last 168 hours. No results for input(s): "AMMONIA" in the last 168 hours. Coagulation Profile: Recent Labs  Lab 08/18/22 2033  INR 1.9*   Cardiac Enzymes: No results for input(s): "CKTOTAL", "CKMB", "CKMBINDEX", "TROPONINI" in the last 168 hours. BNP (last 3 results) No results for input(s): "PROBNP" in the last 8760 hours. HbA1C: No results for input(s): "HGBA1C" in the last 72 hours. CBG: Recent Labs  Lab 08/20/22 0508  GLUCAP 89   Lipid Profile: No results for input(s): "CHOL", "HDL", "LDLCALC",  "TRIG", "CHOLHDL", "LDLDIRECT" in the last 72 hours. Thyroid Function Tests: No results for input(s): "TSH", "T4TOTAL", "FREET4", "T3FREE", "THYROIDAB" in the last 72 hours. Anemia Panel: No results for input(s): "VITAMINB12", "FOLATE", "FERRITIN", "TIBC", "IRON", "RETICCTPCT" in the last 72 hours. Urine analysis:    Component Value Date/Time   COLORURINE AMBER (A) 08/01/2022 1155   APPEARANCEUR CLOUDY (A) 08/01/2022 1155   LABSPEC 1.013 08/01/2022 1155   PHURINE 5.0 08/01/2022 1155   GLUCOSEU NEGATIVE 08/01/2022 1155   HGBUR LARGE (A) 08/01/2022 1155   BILIRUBINUR NEGATIVE 08/01/2022 1155   KETONESUR NEGATIVE 08/01/2022 1155   PROTEINUR 100 (A) 08/01/2022 1155   NITRITE NEGATIVE 08/01/2022 1155   LEUKOCYTESUR NEGATIVE 08/01/2022 1155   Sepsis Labs: @LABRCNTIP (procalcitonin:4,lacticidven:4)  ) Recent Results (from the past 240 hour(s))  Blood culture (routine x 2)     Status: None   Collection Time: 08/18/22 10:45 AM   Specimen: BLOOD LEFT ARM  Result Value Ref Range Status   Specimen Description BLOOD LEFT ARM  Final   Special Requests   Final    BOTTLES DRAWN AEROBIC AND ANAEROBIC Blood Culture adequate volume   Culture   Final    NO GROWTH 5 DAYS Performed at [facility], [address] [place], [place] [address]    Report Status 08/23/2022 FINAL  Final  Resp Panel by RT-PCR (Flu A&B, Covid) Anterior Nasal Swab     Status: None   Collection Time: 08/18/22 10:46 AM   Specimen: Anterior Nasal Swab  Result Value Ref Range Status   SARS Coronavirus 2 by RT PCR NEGATIVE NEGATIVE Final    Comment: (NOTE) SARS-CoV-2 target nucleic acids are NOT DETECTED.  The SARS-CoV-2 RNA is generally detectable in upper respiratory specimens during the acute phase of infection. The lowest concentration of SARS-CoV-2 viral copies this assay can detect is 138 copies/mL. A negative result does not preclude SARS-Cov-2 infection and should not be used as the sole basis for  treatment or other patient management decisions. A negative result may occur with  improper specimen collection/handling, submission of specimen other than nasopharyngeal swab, presence of viral mutation(s) within the areas targeted by this assay, and inadequate number of viral copies(<138 copies/mL). A negative result must be combined with clinical observations, patient history, and epidemiological information. The expected result is Negative.  Fact Sheet  for Patients:  BloggerCourse.com  Fact Sheet for Healthcare Providers:  SeriousBroker.it  This test is no t yet approved or cleared by the [place] FDA and  has been authorized for detection and/or diagnosis of SARS-CoV-2 by FDA under an Emergency Use Authorization (EUA). This EUA will remain  in effect (meaning this test can be used) for the duration of the COVID-19 declaration under Section 564(b)(1) of the Act, 21 U.S.C.section 360bbb-3(b)(1), unless the authorization is terminated  or revoked sooner.       Influenza A by PCR NEGATIVE NEGATIVE Final   Influenza B by PCR NEGATIVE NEGATIVE Final    Comment: (NOTE) The Xpert Xpress SARS-CoV-2/FLU/RSV plus assay is intended as an aid in the diagnosis of influenza from Nasopharyngeal swab specimens and should not be used as a sole basis for treatment. Nasal washings and aspirates are unacceptable for Xpert Xpress SARS-CoV-2/FLU/RSV testing.  Fact Sheet for Patients: BloggerCourse.com  Fact Sheet for Healthcare Providers: SeriousBroker.it  This test is not yet approved or cleared by the [place] FDA and has been authorized for detection and/or diagnosis of SARS-CoV-2 by FDA under an Emergency Use Authorization (EUA). This EUA will remain in effect (meaning this test can be used) for the duration of the COVID-19 declaration under Section 564(b)(1) of the Act, 21  U.S.C. section 360bbb-3(b)(1), unless the authorization is terminated or revoked.  Performed at [facility], [address] [place], [place] [address]   Culture, blood (Routine X 2) w Reflex to ID Panel     Status: None   Collection Time: 08/18/22  9:19 PM   Specimen: BLOOD LEFT ARM  Result Value Ref Range Status   Specimen Description BLOOD LEFT ARM  Final   Special Requests   Final    BOTTLES DRAWN AEROBIC AND ANAEROBIC Blood Culture adequate volume   Culture   Final    NO GROWTH 5 DAYS Performed at [facility], [address] [place], [place] [address]    Report Status 08/23/2022 FINAL  Final  Respiratory (~20 pathogens) panel by PCR     Status: None   Collection Time: 08/21/22  1:50 PM   Specimen: Nasopharyngeal Swab; Respiratory  Result Value Ref Range Status   Adenovirus NOT DETECTED NOT DETECTED Final   Coronavirus 229E NOT DETECTED NOT DETECTED Final    Comment: (NOTE) The Coronavirus on the Respiratory Panel, DOES NOT test for the novel  Coronavirus (2019 nCoV)    Coronavirus HKU1 NOT DETECTED NOT DETECTED Final   Coronavirus NL63 NOT DETECTED NOT DETECTED Final   Coronavirus OC43 NOT DETECTED NOT DETECTED Final   Metapneumovirus NOT DETECTED NOT DETECTED Final   Rhinovirus / Enterovirus NOT DETECTED NOT DETECTED Final   Influenza A NOT DETECTED NOT DETECTED Final   Influenza B NOT DETECTED NOT DETECTED Final   Parainfluenza Virus 1 NOT DETECTED NOT DETECTED Final   Parainfluenza Virus 2 NOT DETECTED NOT DETECTED Final   Parainfluenza Virus 3 NOT DETECTED NOT DETECTED Final   Parainfluenza Virus 4 NOT DETECTED NOT DETECTED Final   Respiratory Syncytial Virus NOT DETECTED NOT DETECTED Final   Bordetella pertussis NOT DETECTED NOT DETECTED Final   Bordetella Parapertussis NOT DETECTED NOT DETECTED Final   Chlamydophila pneumoniae NOT DETECTED NOT DETECTED Final   Mycoplasma pneumoniae NOT DETECTED NOT DETECTED Final    Comment: Performed at  [facility], [address], [place], [place] [address]  Culture, blood (Routine X 2) w Reflex to ID Panel  Status: None (Preliminary result)   Collection Time: 08/21/22  8:58 PM   Specimen: BLOOD  Result Value Ref Range Status   Specimen Description BLOOD LEFT WRIST  Final   Special Requests   Final    BOTTLES DRAWN AEROBIC AND ANAEROBIC Blood Culture adequate volume   Culture   Final    NO GROWTH 2 DAYS Performed at [facility], [address], [place], [place] [address]    Report Status PENDING  Incomplete  Culture, blood (Routine X 2) w Reflex to ID Panel     Status: None (Preliminary result)   Collection Time: 08/21/22 11:01 PM   Specimen: BLOOD  Result Value Ref Range Status   Specimen Description BLOOD BLOOD LEFT ARM  Final   Special Requests   Final    BOTTLES DRAWN AEROBIC AND ANAEROBIC Blood Culture adequate volume   Culture   Final    NO GROWTH 2 DAYS Performed at [facility], [address], [place], [place] [address]    Report Status PENDING  Incomplete  Expectorated Sputum Assessment w Gram Stain, Rflx to Resp Cult     Status: None   Collection Time: 08/22/22 11:22 AM   Specimen: Sputum  Result Value Ref Range Status   Specimen Description SPUTUM  Final   Special Requests EXPSU  Final   Sputum evaluation   Final    THIS SPECIMEN IS ACCEPTABLE FOR SPUTUM CULTURE Performed at [facility], [address], [place], [place] [address]    Report Status 08/22/2022 FINAL  Final  Culture, Respiratory w Gram Stain     Status: None (Preliminary result)   Collection Time: 08/22/22 11:22 AM   Specimen: SPU  Result Value Ref Range Status   Specimen Description   Final    SPUTUM Performed at [facility], [address] [place], [place] [address]    Special Requests   Final    EXPSU Reflexed from [id number] Performed at [facility], [address] [place], [place] [address]    Gram Stain   Final    ABUNDANT  SQUAMOUS EPITHELIAL CELLS PRESENT FEW WBC PRESENT, PREDOMINANTLY PMN ABUNDANT GRAM VARIABLE ROD ABUNDANT GRAM POSITIVE COCCI IN PAIRS    Culture   Final    CULTURE REINCUBATED FOR BETTER GROWTH Performed at [facility], [address] [place], [place] [address]    Report Status PENDING  Incomplete      Studies: US RENAL  Result Date: 08/23/2022 CLINICAL DATA:  Acute kidney insufficiency EXAM: RENAL / URINARY TRACT ULTRASOUND COMPLETE COMPARISON:  Renal ultrasound 08/02/2022. FINDINGS: Right Kidney: Renal measurements: 11.0 x 4.5 x 5.3 cm = volume: 137.8 mL. Echogenicity within normal limits. No mass or hydronephrosis visualized. Left Kidney: Renal measurements: 12.8 x 7.0 x 5.2 cm = volume: 244.7 mL. Echogenicity within normal limits. No mass or hydronephrosis visualized. Bladder: Appears normal for degree of bladder distention. Other: None. IMPRESSION: No collecting system dilatation of either kidney. Electronically Signed   By: Karen Kays M.D.   On: 08/23/2022 16:17   [place] CHEST (PLEURAL EFFUSION)  Result Date: 08/23/2022 CLINICAL DATA:  [id number] Pleural effusion on right [id number] EXAM: CHEST ULTRASOUND COMPARISON:  None Available. FINDINGS: Focused sonographic exam of the right chest was performed. There is a small volume right pleural effusion with intervening lung parenchyma. No safe window for image guided thoracentesis. No intervention performed. IMPRESSION: Small volume right pleural effusion with intervening lung parenchyma. No safe window for image guided thoracentesis.  Electronically Signed   By: Olive Bass M.D.   On: 08/23/2022 16:06    Scheduled Meds:  aspirin EC  81 mg Oral QHS   Chlorhexidine Gluconate Cloth  6 each Topical Daily   cyanocobalamin  1,000 mcg Oral Daily   diltiazem  360 mg Oral Daily   feeding supplement  237 mL Oral BID BM   metoprolol succinate  100 mg Oral Daily   multivitamins with iron  1 tablet Oral Daily   pantoprazole  40 mg Oral Daily    polyethylene glycol  17 g Oral Daily   rosuvastatin  40 mg Oral Daily   senna  1 tablet Oral Daily    Continuous Infusions:  ceFAZolin       LOS: 5 days     Darlin Drop, MD [facility] Pager [phone]  If 7PM-7AM, please contact night-coverage [email or web address] Password [facility] 08/23/2022, 4:29 PM

## 2022-08-23 NOTE — Care Management Important Message (Signed)
Important Message  Patient Details  Name: Brett Riley MRN: [id number] Date of Birth: 08-Feb-1936   Medicare Important Message Given:  Yes     [name] 08/23/2022, 10:48 AM

## 2022-08-23 NOTE — Progress Notes (Signed)
Progress Note  Patient Name: Brett Riley Date of Encounter: 08/22/2022  Primary Cardiologist: Kirke Corin  Subjective   Remains on nasal cannula oxygen 5 L Unable to perform thoracentesis yesterday, hypoxia on laying on his side, note indicating he was unable to sit for procedure No Lasix yesterday Creatinine higher 2.6 Not sleeping well, tired this morning Amiodarone held in the setting of underlying lung disease of uncertain etiology  Inpatient Medications    Scheduled Meds:  aspirin EC  81 mg Oral QHS   Chlorhexidine Gluconate Cloth  6 each Topical Daily   cyanocobalamin  1,000 mcg Oral Daily   diltiazem  420 mg Oral Daily   feeding supplement  237 mL Oral BID BM   lisinopril  40 mg Oral Daily   metoprolol succinate  100 mg Oral Daily   multivitamins with iron  1 tablet Oral Daily   pantoprazole  40 mg Oral Daily   polyethylene glycol  17 g Oral Daily   rosuvastatin  40 mg Oral Daily   senna  1 tablet Oral Daily   Continuous Infusions:  azithromycin 500 mg (08/21/22 1903)   ceFAZolin 2 g (08/22/22 0505)   PRN Meds: acetaminophen, albuterol, dextromethorphan-guaiFENesin, hydrALAZINE, ondansetron (ZOFRAN) IV, simethicone, sodium chloride, traMADol   Vital Signs    Vitals:   08/22/22 0814 08/22/22 0900 08/22/22 1000 08/22/22 1100  BP: 117/65 (!) 113/59 (!) 75/65 129/75  Pulse: 66     Resp: 16     Temp: (!) 97.5 F (36.4 C)     TempSrc: Oral     SpO2: 94% 100% 95%   Weight:      Height:        Intake/Output Summary (Last 24 hours) at 08/22/2022 1137 Last data filed at 08/21/2022 2309 Gross per 24 hour  Intake 326.68 ml  Output 1150 ml  Net -823.32 ml   Filed Weights   08/18/22 2115 08/20/22 0321 08/21/22 0437  Weight: 91.9 kg 90 kg 89.5 kg    Telemetry    Normal sinus rhythm- Personally Reviewed  ECG    No new tracings - Personally Reviewed  Physical Exam   Constitutional:  oriented to person, place, and time. No distress.  HENT:  Head:  Grossly normal Eyes:  no discharge. No scleral icterus.  Neck: No JVD, no carotid bruits  Cardiovascular: Regular rate and rhythm, no murmurs appreciated Pulmonary/Chest: Rales on the left, dullness at the bases bilaterally. Abdominal: Soft.  no distension.  no tenderness.  Musculoskeletal: Normal range of motion Neurological:  normal muscle tone. Coordination normal. No atrophy Skin: Skin warm and dry Psychiatric: normal affect, pleasant   Labs    Chemistry Recent Labs  Lab 08/20/22 0540 08/20/22 1600 08/21/22 0619 08/22/22 0505  NA 135  --  140 141  K 2.7* 3.6 3.3* 3.7  CL 99  --  102 105  CO2 24  --  26 27  GLUCOSE 96  --  115* 139*  BUN 37*  --  43* 55*  CREATININE 1.64*  --  1.90* 2.28*  CALCIUM 7.8*  --  8.0* 8.0*  GFRNONAA 41*  --  34* 28*  ANIONGAP 12  --  12 9     Hematology Recent Labs  Lab 08/20/22 0540 08/20/22 1830 08/21/22 0619 08/22/22 0505  WBC 9.8  --  12.0*  11.9* 10.4  RBC 2.56*  --  3.00*  3.01* 2.69*  HGB 7.7* 9.0* 9.1*  9.1* 8.0*  HCT 24.0* 27.5* 27.9*  27.9*  25.3*  MCV 93.8  --  93.0  92.7 94.1  MCH 30.1  --  30.3  30.2 29.7  MCHC 32.1  --  32.6  32.6 31.6  RDW 13.8  --  14.2  14.1 14.2  PLT 285  --  284  267 241    Cardiac EnzymesNo results for input(s): "TROPONINI" in the last 168 hours. No results for input(s): "TROPIPOC" in the last 168 hours.   BNP Recent Labs  Lab 08/18/22 1020 08/21/22 1655  BNP 279.2* 673.3*     DDimer No results for input(s): "DDIMER" in the last 168 hours.   Radiology    CT CHEST WO CONTRAST  Result Date: 08/21/2022 IMPRESSION: Extensive bilateral upper and lower lobe interstitial opacities are noted most consistent with pulmonary edema or atypical infection. Moderate size bilateral pleural effusions are noted as well. 3.0 x 1.3 cm penetrating atherosclerotic ulcer or focal saccular aneurysm is seen involving distal portion of ascending thoracic aortic aneurysm. Extensive coronary artery  calcifications are noted suggesting coronary artery disease. Aortic Atherosclerosis (ICD10-I70.0). Electronically Signed   By: Lupita Raider M.D.   On: 08/21/2022 14:21    Cardiac Studies   Limited echo 08/19/2022: 1. Left ventricular ejection fraction, by estimation, is 50 to 55%. The  left ventricle has low normal function. Left ventricular endocardial  border not optimally defined to evaluate regional wall motion.   2. Right ventricular systolic function is normal.  __________  TEE 08/05/2022: 1. Left ventricular ejection fraction, by estimation, is 55 to 60%. The  left ventricle has normal function.   2. Right ventricular systolic function is normal. The right ventricular  size is normal.   3. No left atrial/left atrial appendage thrombus was detected.   4. The mitral valve is normal in structure. Mild mitral valve  regurgitation.   5. The aortic valve is tricuspid. Aortic valve regurgitation is trivial.   Patient Profile     87 y.o. 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   Assessment & Plan    Acute respiratory failure Initially treated with IV Lasix twice daily, Lasix held for worsening renal function Etiology unclear,  CT scan with interstitial opacities, concerning for atypical infection especially in light of worsening renal function with diuresis,  Bilateral pleural effusions -Reattempt thoracentesis if patient able to sit, patient willing to try If big enough could do 1 side today, other side over the weekend   Acute on chronic HFpEF  shortness of breath on admission -Limited echocardiogram revealed LVEF of 50-55% with RV systolic function is normal -Treated with  IV Lasix, -6 L Given worsening renal function, Lasix held Creatinine 2.6   3. NSTEMI versus demand ischemia  Likely supply/demand mismatch in the setting of pneumonitis/hypoxia,  fever, infection, anemia -High-sensitivity troponins  peaked at 1775 -Denies significant symptoms concerning for ischemia, no angina -No ischemic changes noted on EKG -Heparin infusion had to be previously held for rectal bleeding -No plans for ischemic workup at this time given renal dysfunction, anemia, rectal bleeding   4.Symptomatic anemia -Hemoglobin 9.0 after blood given, has received transfusion -Heparin infusion  stopped due to rectal bleeding    6Primary hypertension -Continued on diltiazem, Toprol-XL Lasix and lisinopril held   7Hyperlipidemia -Continued on rosuvastatin  8Paroxysmal atrial fibrillation -Currently maintaining sinus rhythm Off amiodarone in the setting of underlying lung disease, previously on 200 twice daily Continue diltiazem and Toprol-XL -Apixaban was placed on hold while he was on heparin infusion, heparin infusion had to be discontinued due to rectal bleeding   9Chronic kidney disease stage IIIa -Serum creatinine 2.6, getting worse with diuresis Lasix held, nephrology following   10 Recent history of MSSA bacteremia with vertebral osteomyelitis -Continued on IV antibiotic therapy ID following     Total encounter time more than 50 minutes  Greater than 50% was spent in counseling and coordination of care with the patient    For questions or updates, please contact [facility] Please consult [email or web address] for contact info under Cardiology/STEMI.    Signed, Dossie Arbour, MD, Ph.D [facility]

## 2022-08-23 NOTE — Progress Notes (Signed)
Date of Admission:  08/18/2022      ID: Brett Riley is a 87 y.o. male Principal Problem:   Acute respiratory failure with hypoxia (HCC) Active Problems:   Hyperlipidemia   CAD (coronary artery disease)   Hypertension   MSSA bacteremia   Vertebral osteomyelitis (HCC)   Myocardial injury   Acute on chronic heart failure with preserved ejection fraction (HFpEF) (HCC)   Chronic kidney disease, stage 3a (HCC)   Normocytic anemia   Atrial fibrillation, chronic (HCC)   Paroxysmal atrial fibrillation (HCC)   HCAP (healthcare-associated pneumonia)   Sepsis (HCC)    Subjective: More alert  Medications:   aspirin EC  81 mg Oral QHS   Chlorhexidine Gluconate Cloth  6 each Topical Daily   cyanocobalamin  1,000 mcg Oral Daily   diltiazem  360 mg Oral Daily   feeding supplement  237 mL Oral BID BM   metoprolol succinate  100 mg Oral Daily   multivitamins with iron  1 tablet Oral Daily   pantoprazole  40 mg Oral Daily   polyethylene glycol  17 g Oral Daily   rosuvastatin  40 mg Oral Daily   senna  1 tablet Oral Daily    Objective: Vital signs in last 24 hours: Patient Vitals for the past 24 hrs:  BP Temp Temp src Pulse Resp SpO2 Weight  08/23/22 0730 (!) 163/78 (!) 97.4 F (36.3 C) Oral 81 20 93 % --  08/23/22 0514 (!) 166/78 -- -- -- -- -- --  08/23/22 0459 -- -- -- -- -- -- 90 kg  08/23/22 0400 (!) 171/72 98.5 F (36.9 C) Oral 72 20 98 % --  08/22/22 2324 (!) 144/66 98.2 F (36.8 C) Oral 77 18 94 % --  08/22/22 2252 -- -- -- -- -- 95 % --  08/22/22 1935 138/68 (!) 97.4 F (36.3 C) Oral 77 20 94 % --  08/22/22 1631 (!) 150/66 97.8 F (36.6 C) Oral 77 20 98 % --  08/22/22 1300 128/62 97.9 F (36.6 C) -- 73 -- 99 % --  08/22/22 1100 129/75 -- -- -- -- -- --  08/22/22 1000 (!) 75/65 -- -- -- -- 95 % --     PHYSICAL EXAM:  General: More alert, responds to questions appropriately Lungs: b/la ir entry crepts Heart: irregular- well controlled Abdomen: Soft,  non-tender,not distended. Bowel sounds normal. No masses Extremities: atraumatic, no cyanosis. No edema. No clubbing Skin: No rashes or lesions. Or bruising Lymph: Cervical, supraclavicular normal. Neurologic: Grossly non-focal Tremors hands Lab Results    Latest Ref Rng & Units 08/23/2022    5:16 AM 08/22/2022    5:05 AM 08/21/2022    6:19 AM  CBC  WBC 4.0 - 10.5 K/uL 12.4  10.4  11.9    12.0   Hemoglobin 13.0 - 17.0 g/dL 9.0  8.0  9.1    9.1   Hematocrit 39.0 - 52.0 % 27.7  25.3  27.9    27.9   Platelets 150 - 400 K/uL 302  241  267    284        Latest Ref Rng & Units 08/23/2022    5:16 AM 08/22/2022    5:05 AM 08/21/2022    6:19 AM  CMP  Glucose 70 - 99 mg/dL 409  811  914   BUN 8 - 23 mg/dL 64  55  43   Creatinine 0.61 - 1.24 mg/dL 7.82  9.56  2.13   Sodium 135 -  145 mmol/L 140  141  140   Potassium 3.5 - 5.1 mmol/L 3.4  3.7  3.3   Chloride 98 - 111 mmol/L 103  105  102   CO2 22 - 32 mmol/L 27  27  26    Calcium 8.9 - 10.3 mg/dL 7.9  8.0  8.0       Microbiology: [facility] NG  Studies/Results: [place] CHEST (PLEURAL EFFUSION)  Result Date: 08/22/2022 CLINICAL DATA:  Patient with decompensated CHF, bilateral pleural effusions. Request received for diagnostic and therapeutic paracentesis. EXAM: CHEST ULTRASOUND COMPARISON:  CT chest without contrast 08/21/2022 FINDINGS: Moderate right pleural effusion visualized, however patient was unable to tolerate positioning for ordered thoracentesis. Patient was unable to sit for procedure, and when he was rolled, patient became acutely short of breath and his oxygen saturation dropped into the 60-70% range. Due to patient being unable to tolerate positioning, thoracentesis was not performed today. IMPRESSION: Moderate right pleural effusion. Patient unable to tolerate positioning for thoracentesis to be performed. Thoracentesis deferred at this time. Read by: Mina Marble, PA-C Electronically Signed   By: Olive Bass M.D.   On: 08/22/2022 13:11    [place] ASCITES (ABDOMEN LIMITED)  Result Date: 08/22/2022 CLINICAL DATA:  Patient with history of decompensated congestive heart failure. Request received for diagnostic and therapeutic paracentesis. EXAM: LIMITED ABDOMEN ULTRASOUND FOR ASCITES TECHNIQUE: Limited ultrasound survey for ascites was performed in all four abdominal quadrants. COMPARISON:  None Available. FINDINGS: No abdominal ascites was visualized on ultrasound. Paracentesis was not performed due to lack visualized fluid. IMPRESSION: No abdominal ascites visualized. Paracentesis was deferred at this time for lack of fluid. Read by: Mina Marble, PA-C Electronically Signed   By: Olive Bass M.D.   On: 08/22/2022 13:04   CT CHEST WO CONTRAST  Result Date: 08/21/2022 CLINICAL DATA:  Dyspnea. EXAM: CT CHEST WITHOUT CONTRAST TECHNIQUE: Multidetector CT imaging of the chest was performed following the standard protocol without IV contrast. RADIATION DOSE REDUCTION: This exam was performed according to the departmental dose-optimization program which includes automated exposure control, adjustment of the mA and/or kV according to patient size and/or use of iterative reconstruction technique. COMPARISON:  August 18, 2022. FINDINGS: Cardiovascular: Atherosclerosis of thoracic aorta is noted. Probable penetrating atherosclerotic ulcer or focal saccular aneurysm measuring 3.0 x 1.3 cm is seen involving posterior aspect of distal portion of ascending thoracic aorta. Normal cardiac size. No pericardial effusion. Extensive coronary artery calcifications are noted. Mediastinum/Nodes: No enlarged mediastinal or axillary lymph nodes. Thyroid gland, trachea, and esophagus demonstrate no significant findings. Lungs/Pleura: Moderate size bilateral pleural effusions are noted. No pneumothorax is noted. Extensive bilateral upper and lower lobe interstitial opacities are noted most consistent with pulmonary edema or atypical infection. Upper Abdomen: No acute  abnormality. Musculoskeletal: No chest wall mass or suspicious bone lesions identified. IMPRESSION: Extensive bilateral upper and lower lobe interstitial opacities are noted most consistent with pulmonary edema or atypical infection. Moderate size bilateral pleural effusions are noted as well. 3.0 x 1.3 cm penetrating atherosclerotic ulcer or focal saccular aneurysm is seen involving distal portion of ascending thoracic aortic aneurysm. Extensive coronary artery calcifications are noted suggesting coronary artery disease. Aortic Atherosclerosis (ICD10-I70.0). Electronically Signed   By: Lupita Raider M.D.   On: 08/21/2022 14:21     Assessment/Plan: Acute hypoxic respiratory failure with b/l infiltrates D.D CHF ? 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

## 2022-08-23 NOTE — Consult Note (Signed)
[facility] CONSULT NOTE    Date: 08/23/2022                  Patient Name:  Brett Riley  MRN: [id number]  DOB: [birth year]  Age / Sex: 87 y.o., male         PCP: Dale Waverly, MD                 Service Requesting Consult: Hospitalist                 Reason for Consult: Acute kidney injury            History of Present Illness: Patient is a 87 y.o. 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  He was felt to have acute on chronic diastolic heart failure.  He was initially started on heparin drip and also has received diuretics this admission.  Subsequently on admission he had a CT scan of the chest which showed question of edema in his upper lung zones versus atypical infection.  There is also the possibility of amiodarone toxicity.  We are asked to see him for acute kidney injury.  Back on 07/20/2022 his creatinine was 0.99.  This admission creatinine has been rising and is currently 2.44.  As above he had recent L-spine osteomyelitis and discitis.  He was started on cefazolin for this.  This admission he has been undergoing diuresis which may be playing some role in his worsening renal function.  Urinalysis was ordered this a.m.  This shows hemoglobin positivity on dipstick with only 0-5 RBCs per high-power field however.  Urine protein to creatinine ratio was 1.8.   Medications: Outpatient medications: Medications Prior to Admission  Medication Sig Dispense Refill Last Dose   amiodarone (PACERONE) 200 MG tablet Take 1 tablet (200 mg total) by mouth 2 (two) times daily.   08/17/2022 at 1845   apixaban (ELIQUIS) 2.5 MG TABS tablet Take 1 tablet (2.5 mg  total) by mouth 2 (two) times daily. 60 tablet  08/17/2022 at 1845   aspirin 81 MG tablet Take 81 mg by mouth at bedtime.    08/17/2022 at 1900   ceFAZolin (ANCEF) IVPB Inject 2 g into the vein every 8 (eight) hours. Indication:  Vertebral osteomyelitis  First Dose: Yes Last Day of Therapy:  09/13/22  Labs - Once weekly (Monday):  CBC/D and CMP, Labs - Once weekly (Monday): ESR and CRP Fax weekly lab results  promptly to [phone] Method of administration: IV Push- Please pull PICC at completion of IV antibiotics  Method of administration may be changed at the discretion of home infusion pharmacist based upon assessment of the patient and/or caregiver's ability to self-administer the medication ordered. 114 Units 0 08/18/2022 at 0545   diltiazem (TIAZAC) 420 MG 24 hr capsule Take 420 mg by mouth daily.   08/17/2022 at 0930   hydrALAZINE (APRESOLINE) 25 MG tablet Take 25 mg by mouth every 6 (six) hours as needed (SBP >160).   Unknown at PRN   lisinopril (ZESTRIL) 40 MG tablet Take 1 tablet (40 mg total) by mouth daily. 90 tablet 1 08/17/2022 at 0930   metoprolol succinate (TOPROL-XL) 100 MG 24 hr tablet TAKE 1 TABLET BY MOUTH  EVERY DAY WITH A MEAL 90 tablet 2 08/17/2022 at 0930   Multiple Vitamins-Iron (MULTIVITAMINS WITH IRON) TABS Take 1 tablet by mouth daily.   08/17/2022 at 0930   naloxone [facility]) nasal spray 4 mg/0.1 mL Place 4 mg into the nose 3 (three) times daily as needed (opioid overdose).   Unknown at PRN   pantoprazole (PROTONIX) 40 MG tablet Take 1 tablet (40 mg total) by mouth daily.   08/18/2022 at 0545   polyethylene glycol (MIRALAX / GLYCOLAX) 17 g packet Take 17 g by mouth daily.   08/17/2022 at 0930   rosuvastatin (CRESTOR) 10 MG tablet Take 1 tablet (10 mg total) by mouth daily. 90 tablet 3 08/17/2022 at 1845   senna (SENOKOT) 8.6 MG TABS tablet Take 1 tablet by mouth daily.   08/17/2022 at 0930   sodium chloride (OCEAN) 0.65 % SOLN nasal spray Place 1 spray into both nostrils as  needed for congestion.  0 Unknown at PRN   traMADol (ULTRAM) 50 MG tablet Take 50 mg by mouth every 8 (eight) hours as needed for moderate pain.   08/18/2022 at PRN   traMADol HCl 100 MG TABS Take 100 mg by mouth every 12 (twelve) hours as needed (severe pain).   08/18/2022 at PRN   vitamin B-12 (CYANOCOBALAMIN) 1000 MCG tablet Take 1,000 mcg by mouth daily.   08/17/2022 at 0930   diltiazem (CARDIZEM CD) 360 MG 24 hr capsule Take 1 capsule (360 mg total) by mouth daily. (Patient not taking: Reported on 08/18/2022)   Not Taking   feeding supplement (ENSURE ENLIVE / ENSURE PLUS) LIQD Take 237 mLs by mouth 2 (two) times daily between meals. 237 mL 12     Current medications: Current Facility-Administered Medications  Medication Dose Route Frequency Provider Last Rate Last Admin   acetaminophen (TYLENOL) tablet 650 mg  650 mg Oral Q6H PRN Lorretta Harp, MD       albuterol (PROVENTIL) (2.5 MG/3ML) 0.083% nebulizer solution 2.5 mg  2.5 mg Nebulization Q4H PRN Lorretta Harp, MD       aspirin EC tablet 81 mg  81 mg Oral QHS Lorretta Harp, MD   81 mg at 08/22/22 2155   ceFAZolin (ANCEF) IVPB 2g/100 mL premix  2 g Intravenous Q12H Zeigler, Burtis Junes, RPH       Chlorhexidine Gluconate Cloth 2 % PADS 6 each  6 each Topical Daily Lorretta Harp, MD   6 each at 08/23/22 1139   cyanocobalamin (VITAMIN B12) tablet 1,000 mcg  1,000 mcg Oral Daily Lorretta Harp, MD   1,000 mcg at 08/23/22 0902   dexamethasone (DECADRON) injection 8 mg  8 mg Intravenous Q24H Vida Rigger, MD   8 mg at 08/23/22 1716   dextromethorphan-guaiFENesin (MUCINEX DM) 30-600 MG per 12 hr tablet 1 tablet  1 tablet Oral BID PRN Lorretta Harp, MD   1 tablet at 08/23/22 0901   diltiazem (CARDIZEM CD) 24 hr capsule 360 mg  360 mg Oral Daily Antonieta Iba, MD   360 mg at 08/23/22 0902   feeding supplement (ENSURE ENLIVE / ENSURE PLUS) liquid 237 mL  237 mL Oral BID BM Lorretta Harp, MD   237 mL at 08/21/22 1416   hydrALAZINE (APRESOLINE) injection 5 mg  5 mg  Intravenous Q2H PRN Lorretta Harp, MD   5 mg at 08/23/22 0514   melatonin tablet 5 mg  5 mg Oral QHS PRN Dow Adolph N, DO       metoprolol succinate (TOPROL-XL) 24 hr  tablet 100 mg  100 mg Oral Daily Lorretta Harp, MD   100 mg at 08/23/22 0902   multivitamins with iron tablet 1 tablet  1 tablet Oral Daily Sunnie Nielsen, DO   1 tablet at 08/23/22 0901   ondansetron (ZOFRAN) injection 4 mg  4 mg Intravenous Q8H PRN Lorretta Harp, MD       pantoprazole (PROTONIX) EC tablet 40 mg  40 mg Oral Daily Lorretta Harp, MD   40 mg at 08/23/22 0902   polyethylene glycol (MIRALAX / GLYCOLAX) packet 17 g  17 g Oral Daily Lorretta Harp, MD   17 g at 08/23/22 0901   rosuvastatin (CRESTOR) tablet 40 mg  40 mg Oral Daily Mansy, Jan A, MD   40 mg at 08/23/22 0902   senna (SENOKOT) tablet 8.6 mg  1 tablet Oral Daily Lorretta Harp, MD   8.6 mg at 08/23/22 0902   simethicone (MYLICON) chewable tablet 80 mg  80 mg Oral QID PRN Mansy, Jan A, MD   80 mg at 08/20/22 2121   sodium chloride (OCEAN) 0.65 % nasal spray 1 spray  1 spray Each Nare PRN Sunnie Nielsen, DO   1 spray at 08/20/22 0448   traMADol (ULTRAM) tablet 50 mg  50 mg Oral Q6H PRN Lorretta Harp, MD   50 mg at 08/20/22 1801      Allergies: No Known Allergies    Past Medical History: Past Medical History:  Diagnosis Date   Cancer (HCC)    skin cancer basal   Carotid arterial disease (HCC)    a. 02/2016 L CEA; b. 01/2017 U/S: patent LICA, 1-39% RICA.   Celiac artery stenosis (HCC)    Coronary artery disease    a. 01/2016 MV: mild apical/basal inferior, apical lateral, mid anterolateral, and mid inferolateral ischemia. EF 57%; b. 02/2016 Cath: LM 40ost, LAD 70p/m, 20d, D1 95 (small), LCX nl, RCA 63m, RPDA 90 (small), EF 55-65%-->med Rx. Rec CABG for recurrent symptoms.   Hearing loss    History of echocardiogram    a. 02/2016 Echo: EF 50-55%, no rwma, mild MR.   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  cm left acetabulum   Osteoarthritis, hip, bilateral    Left > Right     Past Surgical History: Past Surgical History:  Procedure Laterality Date   AORTIC INTERVENTION N/A 07/24/2022   Procedure: AORTIC INTERVENTION;  Surgeon: Renford Dills, MD;  Location: [facility] INVASIVE CV LAB;  Service: Cardiovascular;  Laterality: N/A;   CARDIAC CATHETERIZATION N/A 01/19/2016   Procedure: Left Heart Cath and Coronary Angiography;  Surgeon: Iran Ouch, MD;  Location: [facility] INVASIVE CV LAB;  Service: Cardiovascular;  Laterality: N/A;   COLONOSCOPY     CYSTOSCOPY/URETEROSCOPY/HOLMIUM LASER/STENT PLACEMENT Right 04/13/2021   Procedure: CYSTOSCOPY/URETEROSCOPY/HOLMIUM LASER/STENT PLACEMENT;  Surgeon: Sondra Come, MD;  Location: [facility] ORS;  Service: Urology;  Laterality: Right;   ENDARTERECTOMY Left 02/15/2016   Procedure: ENDARTERECTOMY CAROTID;  Surgeon: Annice Needy, MD;  Location: [facility] ORS;  Service: Vascular;  Laterality: Left;   TEE WITHOUT CARDIOVERSION N/A 08/05/2022   Procedure: TRANSESOPHAGEAL ECHOCARDIOGRAM;  Surgeon: Debbe Odea, MD;  Location: [facility] ORS;  Service: Cardiovascular;  Laterality: N/A;     Family History: Family History  Problem Relation Age of Onset   Stroke Mother    Heart disease Father        MI   Heart attack Father  Breast cancer Sister    Colon cancer Neg Hx    Prostate cancer Neg Hx      Social History: Social History   Socioeconomic History   Marital status: Married    Spouse name: Not on file   Number of children: 1   Years of education: Not on file   Highest education level: Not on file  Occupational History    Employer: [employer]  Tobacco Use   Smoking status: Never   Smokeless tobacco: Never  Vaping Use   Vaping Use: Never used  Substance and Sexual Activity   Alcohol use: No    Alcohol/week: 0.0 standard drinks of alcohol   Drug use: No   Sexual activity: Not Currently  Other Topics Concern   Not on file  Social History  Narrative   Not on file   Social Determinants of Health   Financial Resource Strain: Low Risk  (08/29/2020)   Overall Financial Resource Strain (CARDIA)    Difficulty of Paying Living Expenses: Not hard at all  Food Insecurity: No Food Insecurity (08/18/2022)   Hunger Vital Sign    Worried About Running Out of Food in the Last Year: Never true    Ran Out of Food in the Last Year: Never true  Transportation Needs: No Transportation Needs (08/18/2022)   PRAPARE - Administrator, Civil Service (Medical): No    Lack of Transportation (Non-Medical): No  Physical Activity: Insufficiently Active (08/29/2020)   Exercise Vital Sign    Days of Exercise per Week: 3 days    Minutes of Exercise per Session: 20 min  Stress: No Stress Concern Present (08/29/2020)   [employer] of Occupational Health - Occupational Stress Questionnaire    Feeling of Stress : Not at all  Social Connections: Unknown (08/29/2020)   Social Connection and Isolation Panel [NHANES]    Frequency of Communication with Friends and Family: Not on file    Frequency of Social Gatherings with Friends and Family: Not on file    Attends Religious Services: Not on file    Active Member of Clubs or Organizations: Not on file    Attends Banker Meetings: Not on file    Marital Status: Married  Intimate Partner Violence: Not At Risk (08/18/2022)   Humiliation, Afraid, Rape, and Kick questionnaire    Fear of Current or Ex-Partner: No    Emotionally Abused: No    Physically Abused: No    Sexually Abused: No     Review of Systems: Review of Systems  Constitutional:  Negative for chills, fever and malaise/fatigue.  HENT:  Negative for congestion, hearing loss and tinnitus.   Eyes:  Negative for blurred vision and double vision.  Respiratory:  Positive for cough and shortness of breath. Negative for sputum production.   Cardiovascular:  Negative for chest pain, palpitations and orthopnea.   Gastrointestinal:  Negative for diarrhea, nausea and vomiting.  Genitourinary:  Negative for dysuria, frequency and urgency.  Musculoskeletal:  Negative for myalgias.  Skin:  Negative for itching and rash.  Neurological:  Positive for weakness. Negative for dizziness and focal weakness.  Endo/Heme/Allergies:  Negative for polydipsia. Does not bruise/bleed easily.  Psychiatric/Behavioral:  The patient is not nervous/anxious.      Vital Signs: Blood pressure 133/65, pulse 73, temperature 98.1 F (36.7 C), resp. rate 17, height 6' (1.829 m), weight 90 kg, SpO2 98 %.  Weight trends: Filed Weights   08/20/22 0321 08/21/22 0437 08/23/22 0459  Weight:  90 kg 89.5 kg 90 kg     Physical Exam: General: No acute distress  Head: Normocephalic, atraumatic. Moist oral mucosal membranes  Eyes: Anicteric  Neck: Supple  Lungs:  Scattered rales and rhonchi bilateral, normal effort  Heart: S1S2 irregular  Abdomen:  Soft, nontender, bowel sounds present  Extremities: Trace peripheral edema.  Neurologic: Awake, alert, following commands  Skin: No acute rash  Access: No hemodialysis access    Lab results: Basic Metabolic Panel: Recent Labs  Lab 08/18/22 1046 08/19/22 0505 08/20/22 0540 08/20/22 1600 08/21/22 0619 08/22/22 0505 08/23/22 0516  NA  --    < > 135  --  140 141 140  K  --    < > 2.7*   < > 3.3* 3.7 3.4*  CL  --    < > 99  --  102 105 103  CO2  --    < > 24  --  26 27 27   GLUCOSE  --    < > 96  --  115* 139* 115*  BUN  --    < > 37*  --  43* 55* 64*  CREATININE  --    < > 1.64*  --  1.90* 2.28* 2.44*  CALCIUM  --    < > 7.8*  --  8.0* 8.0* 7.9*  MG 2.0  --  1.7  --   --   --  2.2  PHOS  --   --   --   --   --   --  3.4   < > = values in this interval not displayed.    Liver Function Tests: Recent Labs  Lab 08/23/22 0516  ALBUMIN 2.1*   No results for input(s): "LIPASE", "AMYLASE" in the last 168 hours. No results for input(s): "AMMONIA" in the last 168  hours.  CBC: Recent Labs  Lab 08/18/22 1020 08/19/22 0505 08/20/22 0540 08/20/22 1830 08/21/22 0619 08/22/22 0505 08/23/22 0516  WBC 10.8* 9.3 9.8  --  12.0*  11.9* 10.4 12.4*  NEUTROABS 8.9*  --   --   --  9.6* 9.3*  --   HGB 8.7* 7.6* 7.7*   < > 9.1*  9.1* 8.0* 9.0*  HCT 27.8* 24.1* 24.0*   < > 27.9*  27.9* 25.3* 27.7*  MCV 98.2 96.0 93.8  --  93.0  92.7 94.1 93.9  PLT 282 245 285  --  284  267 241 302   < > = values in this interval not displayed.    Cardiac Enzymes: No results for input(s): "CKTOTAL", "CKMB", "CKMBINDEX", "TROPONINI" in the last 168 hours.  BNP: Invalid input(s): "POCBNP"  CBG: Recent Labs  Lab 08/20/22 0508  GLUCAP 89    Microbiology: Results for orders placed or performed during the hospital encounter of 08/18/22  Blood culture (routine x 2)     Status: None   Collection Time: 08/18/22 10:45 AM   Specimen: BLOOD LEFT ARM  Result Value Ref Range Status   Specimen Description BLOOD LEFT ARM  Final   Special Requests   Final    BOTTLES DRAWN AEROBIC AND ANAEROBIC Blood Culture adequate volume   Culture   Final    NO GROWTH 5 DAYS Performed at [facility], [address] [place], [place] [address]    Report Status 08/23/2022 FINAL  Final  Resp Panel by RT-PCR (Flu A&B, Covid) Anterior Nasal Swab     Status: None   Collection Time: 08/18/22 10:46 AM  Specimen: Anterior Nasal Swab  Result Value Ref Range Status   SARS Coronavirus 2 by RT PCR NEGATIVE NEGATIVE Final    Comment: (NOTE) SARS-CoV-2 target nucleic acids are NOT DETECTED.  The SARS-CoV-2 RNA is generally detectable in upper respiratory specimens during the acute phase of infection. The lowest concentration of SARS-CoV-2 viral copies this assay can detect is 138 copies/mL. A negative result does not preclude SARS-Cov-2 infection and should not be used as the sole basis for treatment or other patient management decisions. A negative result may occur with   improper specimen collection/handling, submission of specimen other than nasopharyngeal swab, presence of viral mutation(s) within the areas targeted by this assay, and inadequate number of viral copies(<138 copies/mL). A negative result must be combined with clinical observations, patient history, and epidemiological information. The expected result is Negative.  Fact Sheet for Patients:  BloggerCourse.com  Fact Sheet for Healthcare Providers:  SeriousBroker.it  This test is no t yet approved or cleared by the [place] FDA and  has been authorized for detection and/or diagnosis of SARS-CoV-2 by FDA under an Emergency Use Authorization (EUA). This EUA will remain  in effect (meaning this test can be used) for the duration of the COVID-19 declaration under Section 564(b)(1) of the Act, 21 U.S.C.section 360bbb-3(b)(1), unless the authorization is terminated  or revoked sooner.       Influenza A by PCR NEGATIVE NEGATIVE Final   Influenza B by PCR NEGATIVE NEGATIVE Final    Comment: (NOTE) The Xpert Xpress SARS-CoV-2/FLU/RSV plus assay is intended as an aid in the diagnosis of influenza from Nasopharyngeal swab specimens and should not be used as a sole basis for treatment. Nasal washings and aspirates are unacceptable for Xpert Xpress SARS-CoV-2/FLU/RSV testing.  Fact Sheet for Patients: BloggerCourse.com  Fact Sheet for Healthcare Providers: SeriousBroker.it  This test is not yet approved or cleared by the [place] FDA and has been authorized for detection and/or diagnosis of SARS-CoV-2 by FDA under an Emergency Use Authorization (EUA). This EUA will remain in effect (meaning this test can be used) for the duration of the COVID-19 declaration under Section 564(b)(1) of the Act, 21 U.S.C. section 360bbb-3(b)(1), unless the authorization is terminated  or revoked.  Performed at [facility], [address] [place], [place] [address]   Culture, blood (Routine X 2) w Reflex to ID Panel     Status: None   Collection Time: 08/18/22  9:19 PM   Specimen: BLOOD LEFT ARM  Result Value Ref Range Status   Specimen Description BLOOD LEFT ARM  Final   Special Requests   Final    BOTTLES DRAWN AEROBIC AND ANAEROBIC Blood Culture adequate volume   Culture   Final    NO GROWTH 5 DAYS Performed at [facility], [address] [place], [place] [address]    Report Status 08/23/2022 FINAL  Final  Respiratory (~20 pathogens) panel by PCR     Status: None   Collection Time: 08/21/22  1:50 PM   Specimen: Nasopharyngeal Swab; Respiratory  Result Value Ref Range Status   Adenovirus NOT DETECTED NOT DETECTED Final   Coronavirus 229E NOT DETECTED NOT DETECTED Final    Comment: (NOTE) The Coronavirus on the Respiratory Panel, DOES NOT test for the novel  Coronavirus (2019 nCoV)    Coronavirus HKU1 NOT DETECTED NOT DETECTED Final   Coronavirus NL63 NOT DETECTED NOT DETECTED Final   Coronavirus OC43 NOT DETECTED NOT DETECTED Final   Metapneumovirus NOT DETECTED NOT DETECTED  Final   Rhinovirus / Enterovirus NOT DETECTED NOT DETECTED Final   Influenza A NOT DETECTED NOT DETECTED Final   Influenza B NOT DETECTED NOT DETECTED Final   Parainfluenza Virus 1 NOT DETECTED NOT DETECTED Final   Parainfluenza Virus 2 NOT DETECTED NOT DETECTED Final   Parainfluenza Virus 3 NOT DETECTED NOT DETECTED Final   Parainfluenza Virus 4 NOT DETECTED NOT DETECTED Final   Respiratory Syncytial Virus NOT DETECTED NOT DETECTED Final   Bordetella pertussis NOT DETECTED NOT DETECTED Final   Bordetella Parapertussis NOT DETECTED NOT DETECTED Final   Chlamydophila pneumoniae NOT DETECTED NOT DETECTED Final   Mycoplasma pneumoniae NOT DETECTED NOT DETECTED Final    Comment: Performed at [facility], [address] [place], [place] [address]   Culture, blood (Routine X 2) w Reflex to ID Panel     Status: None (Preliminary result)   Collection Time: 08/21/22  8:58 PM   Specimen: BLOOD  Result Value Ref Range Status   Specimen Description BLOOD LEFT WRIST  Final   Special Requests   Final    BOTTLES DRAWN AEROBIC AND ANAEROBIC Blood Culture adequate volume   Culture   Final    NO GROWTH 2 DAYS Performed at [facility], [address] [place], [place] [address]    Report Status PENDING  Incomplete  Culture, blood (Routine X 2) w Reflex to ID Panel     Status: None (Preliminary result)   Collection Time: 08/21/22 11:01 PM   Specimen: BLOOD  Result Value Ref Range Status   Specimen Description BLOOD BLOOD LEFT ARM  Final   Special Requests   Final    BOTTLES DRAWN AEROBIC AND ANAEROBIC Blood Culture adequate volume   Culture   Final    NO GROWTH 2 DAYS Performed at [facility], [address] [place], [place] [address]    Report Status PENDING  Incomplete  Expectorated Sputum Assessment w Gram Stain, Rflx to Resp Cult     Status: None   Collection Time: 08/22/22 11:22 AM   Specimen: Sputum  Result Value Ref Range Status   Specimen Description SPUTUM  Final   Special Requests EXPSU  Final   Sputum evaluation   Final    THIS SPECIMEN IS ACCEPTABLE FOR SPUTUM CULTURE Performed at [facility], [address] [place], [place] [address]    Report Status 08/22/2022 FINAL  Final  Culture, Respiratory w Gram Stain     Status: None (Preliminary result)   Collection Time: 08/22/22 11:22 AM   Specimen: SPU  Result Value Ref Range Status   Specimen Description   Final    SPUTUM Performed at [facility], [address] [place], [place] [address]    Special Requests   Final    EXPSU Reflexed from [id number] Performed at [facility], [address] [place], [place] [address]    Gram Stain   Final    ABUNDANT SQUAMOUS EPITHELIAL CELLS PRESENT FEW WBC PRESENT,  PREDOMINANTLY PMN ABUNDANT GRAM VARIABLE ROD ABUNDANT GRAM POSITIVE COCCI IN PAIRS    Culture   Final    CULTURE REINCUBATED FOR BETTER GROWTH Performed at [facility], [address] [place], [place] [address]    Report Status PENDING  Incomplete    Coagulation Studies: No results for input(s): "LABPROT", "INR" in the last 72 hours.  Urinalysis: Recent Labs    08/23/22 1039  COLORURINE YELLOW*  LABSPEC 1.015  PHURINE 5.0  GLUCOSEU  NEGATIVE  HGBUR MODERATE*  BILIRUBINUR NEGATIVE  KETONESUR NEGATIVE  PROTEINUR 100*  NITRITE NEGATIVE  LEUKOCYTESUR NEGATIVE      Imaging: US RENAL  Result Date: 08/23/2022 CLINICAL DATA:  Acute kidney insufficiency EXAM: RENAL / URINARY TRACT ULTRASOUND COMPLETE COMPARISON:  Renal ultrasound 08/02/2022. FINDINGS: Right Kidney: Renal measurements: 11.0 x 4.5 x 5.3 cm = volume: 137.8 mL. Echogenicity within normal limits. No mass or hydronephrosis visualized. Left Kidney: Renal measurements: 12.8 x 7.0 x 5.2 cm = volume: 244.7 mL. Echogenicity within normal limits. No mass or hydronephrosis visualized. Bladder: Appears normal for degree of bladder distention. Other: None. IMPRESSION: No collecting system dilatation of either kidney. Electronically Signed   By: Karen Kays M.D.   On: 08/23/2022 16:17   [place] CHEST (PLEURAL EFFUSION)  Result Date: 08/23/2022 CLINICAL DATA:  [id number] Pleural effusion on right [id number] EXAM: CHEST ULTRASOUND COMPARISON:  None Available. FINDINGS: Focused sonographic exam of the right chest was performed. There is a small volume right pleural effusion with intervening lung parenchyma. No safe window for image guided thoracentesis. No intervention performed. IMPRESSION: Small volume right pleural effusion with intervening lung parenchyma. No safe window for image guided thoracentesis. Electronically Signed   By: Olive Bass M.D.   On: 08/23/2022 16:06   [place] CHEST (PLEURAL EFFUSION)  Result Date: 08/22/2022 CLINICAL  DATA:  Patient with decompensated CHF, bilateral pleural effusions. Request received for diagnostic and therapeutic paracentesis. EXAM: CHEST ULTRASOUND COMPARISON:  CT chest without contrast 08/21/2022 FINDINGS: Moderate right pleural effusion visualized, however patient was unable to tolerate positioning for ordered thoracentesis. Patient was unable to sit for procedure, and when he was rolled, patient became acutely short of breath and his oxygen saturation dropped into the 60-70% range. Due to patient being unable to tolerate positioning, thoracentesis was not performed today. IMPRESSION: Moderate right pleural effusion. Patient unable to tolerate positioning for thoracentesis to be performed. Thoracentesis deferred at this time. Read by: Mina Marble, PA-C Electronically Signed   By: Olive Bass M.D.   On: 08/22/2022 13:11   [place] ASCITES (ABDOMEN LIMITED)  Result Date: 08/22/2022 CLINICAL DATA:  Patient with history of decompensated congestive heart failure. Request received for diagnostic and therapeutic paracentesis. EXAM: LIMITED ABDOMEN ULTRASOUND FOR ASCITES TECHNIQUE: Limited ultrasound survey for ascites was performed in all four abdominal quadrants. COMPARISON:  None Available. FINDINGS: No abdominal ascites was visualized on ultrasound. Paracentesis was not performed due to lack visualized fluid. IMPRESSION: No abdominal ascites visualized. Paracentesis was deferred at this time for lack of fluid. Read by: Mina Marble, PA-C Electronically Signed   By: Olive Bass M.D.   On: 08/22/2022 13:04     Assessment & Plan: Pt is a 87 y.o. 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  1.  Acute kidney injury/proteinuria/hemoglobin positive on UA.  Patient presents with an interesting and complex case.  Back in May he had a normal creatinine of 0.9.  Recently he has had worsening renal function.  Differential diagnosis extensive and could include interstitial nephritis from use of cefazolin, postinfectious glomerulonephritis, and some potential element of overdiuresis from diuretic usage.  We have ordered extensive serologic workup including myeloma panel, ANA, ANCA antibodies, GBM antibodies, C3, C4.  Urine protein to creatinine ratio came  back at 1.8.  Urinalysis was positive for hemoglobin but only 0-5 RBCs were noted per high-power field.  As a result of this we will also order CK.  No acute indication for dialysis at the moment.  May need to eventually consider renal biopsy but timing not optimal for that at the moment.  2.  Acute on chronic diastolic heart failure.  Ejection fraction 55 to 60% with grade 1 diastolic dysfunction noted.  He was on diuretics but these have been held due to worsening renal function.  3.  History of MSSA bacteremia and vertebral osteomyelitis.  He is being treated with cefazolin for this.  Of note cefazolin could potentially be causing interstitial nephritis.  Check urine for eosinophils.  4.  Thanks for consultation.

## 2022-08-23 NOTE — Progress Notes (Signed)
Pt was brought to [place] for diagnostic and therapeutic thoracentesis. Upon US examination, there was trace fluid on L and a very small fluid pocket on R that was not large enough to safely access.  Exam was ended and explained to patient.  Patient was in agreement with findings.      Alex Gardener, AGNP-BC 08/23/2022, 3:42 PM

## 2022-08-23 NOTE — Progress Notes (Signed)
PHARMACY NOTE:  ANTIMICROBIAL RENAL DOSAGE ADJUSTMENT  Current antimicrobial regimen includes a mismatch between antimicrobial dosage and estimated renal function.  As per policy approved by the Pharmacy & Therapeutics and Medical Executive Committees, the antimicrobial dosage will be adjusted accordingly.  Current antimicrobial dosage:  Cefazolin 2gm IV q8h  Indication: MSSA bacteremia/discitis  Renal Function:  Estimated Creatinine Clearance: 24.7 mL/min (A) (by C-G formula based on SCr of 2.44 mg/dL (H)). []      On intermittent HD, scheduled: []      On CRRT    Antimicrobial dosage has been changed to:  Cefazolin 2gm IV q12h  Additional comments:   Thank you for allowing pharmacy to be a part of this patient's care.   Juliette Alcide, PharmD, BCPS, BCIDP Work Cell: [phone] 08/23/2022 11:56 AM

## 2022-08-24 DIAGNOSIS — J90 Pleural effusion, not elsewhere classified: Secondary | ICD-10-CM

## 2022-08-24 DIAGNOSIS — I10 Essential (primary) hypertension: Secondary | ICD-10-CM | POA: Diagnosis not present

## 2022-08-24 DIAGNOSIS — I5023 Acute on chronic systolic (congestive) heart failure: Secondary | ICD-10-CM

## 2022-08-24 DIAGNOSIS — N179 Acute kidney failure, unspecified: Secondary | ICD-10-CM

## 2022-08-24 DIAGNOSIS — J9601 Acute respiratory failure with hypoxia: Secondary | ICD-10-CM | POA: Diagnosis not present

## 2022-08-24 DIAGNOSIS — I5031 Acute diastolic (congestive) heart failure: Secondary | ICD-10-CM

## 2022-08-24 DIAGNOSIS — E7800 Pure hypercholesterolemia, unspecified: Secondary | ICD-10-CM

## 2022-08-24 LAB — COMPREHENSIVE METABOLIC PANEL WITH GFR
ALT: 15 U/L (ref 0–44)
AST: 141 U/L — ABNORMAL HIGH (ref 15–41)
Albumin: 2 g/dL — ABNORMAL LOW (ref 3.5–5.0)
Alkaline Phosphatase: 117 U/L (ref 38–126)
Anion gap: 10 (ref 5–15)
BUN: 66 mg/dL — ABNORMAL HIGH (ref 8–23)
CO2: 26 mmol/L (ref 22–32)
Calcium: 8 mg/dL — ABNORMAL LOW (ref 8.9–10.3)
Chloride: 104 mmol/L (ref 98–111)
Creatinine, Ser: 2.43 mg/dL — ABNORMAL HIGH (ref 0.61–1.24)
GFR, Estimated: 26 mL/min — ABNORMAL LOW
Glucose, Bld: 120 mg/dL — ABNORMAL HIGH (ref 70–99)
Potassium: 3.8 mmol/L (ref 3.5–5.1)
Sodium: 140 mmol/L (ref 135–145)
Total Bilirubin: 0.6 mg/dL (ref 0.3–1.2)
Total Protein: 6.1 g/dL — ABNORMAL LOW (ref 6.5–8.1)

## 2022-08-24 LAB — CBC WITH DIFFERENTIAL/PLATELET
Abs Immature Granulocytes: 0.06 10*3/uL (ref 0.00–0.07)
Basophils Absolute: 0 10*3/uL (ref 0.0–0.1)
Basophils Relative: 0 %
Eosinophils Absolute: 0 10*3/uL (ref 0.0–0.5)
Eosinophils Relative: 0 %
HCT: 27.6 % — ABNORMAL LOW (ref 39.0–52.0)
Hemoglobin: 9 g/dL — ABNORMAL LOW (ref 13.0–17.0)
Immature Granulocytes: 1 %
Lymphocytes Relative: 11 %
Lymphs Abs: 1 10*3/uL (ref 0.7–4.0)
MCH: 30.5 pg (ref 26.0–34.0)
MCHC: 32.6 g/dL (ref 30.0–36.0)
MCV: 93.6 fL (ref 80.0–100.0)
Monocytes Absolute: 0.3 10*3/uL (ref 0.1–1.0)
Monocytes Relative: 3 %
Neutro Abs: 8.2 10*3/uL — ABNORMAL HIGH (ref 1.7–7.7)
Neutrophils Relative %: 85 %
Platelets: 279 10*3/uL (ref 150–400)
RBC: 2.95 MIL/uL — ABNORMAL LOW (ref 4.22–5.81)
RDW: 14.1 % (ref 11.5–15.5)
WBC: 9.6 10*3/uL (ref 4.0–10.5)
nRBC: 0 % (ref 0.0–0.2)

## 2022-08-24 LAB — STREP PNEUMONIAE URINARY ANTIGEN: Strep Pneumo Urinary Antigen: NEGATIVE

## 2022-08-24 LAB — C4 COMPLEMENT: Complement C4, Body Fluid: 35 mg/dL (ref 12–38)

## 2022-08-24 LAB — C-REACTIVE PROTEIN: CRP: 7.8 mg/dL — ABNORMAL HIGH

## 2022-08-24 LAB — PHOSPHORUS: Phosphorus: 3.5 mg/dL (ref 2.5–4.6)

## 2022-08-24 LAB — MAGNESIUM: Magnesium: 2.5 mg/dL — ABNORMAL HIGH (ref 1.7–2.4)

## 2022-08-24 LAB — C3 COMPLEMENT: C3 Complement: 104 mg/dL (ref 82–167)

## 2022-08-24 MED ORDER — FUROSEMIDE 10 MG/ML IJ SOLN
60.0000 mg | Freq: Two times a day (BID) | INTRAMUSCULAR | Status: DC
  Administered 2022-08-24 – 2022-08-28 (×8): 60 mg via INTRAVENOUS
  Filled 2022-08-24 (×8): qty 6

## 2022-08-24 NOTE — Progress Notes (Signed)
[facility]  ROUNDING NOTE   Subjective:   Wife at bedside.  UOP 2L   Patient reports eating well.   Creatinine 2.43 (2.44)  Objective:  Vital signs in last 24 hours:  Temp:  [97.8 F (36.6 C)-98.3 F (36.8 C)] 98 F (36.7 C) (06/22 0800) Pulse Rate:  [73-77] 77 (06/22 0900) Resp:  [15-27] 19 (06/22 0900) BP: (133-158)/(62-74) 155/69 (06/22 0800) SpO2:  [91 %-98 %] 91 % (06/22 0900)  Weight change:  Filed Weights   08/20/22 0321 08/21/22 0437 08/23/22 0459  Weight: 90 kg 89.5 kg 90 kg    Intake/Output: I/O last 3 completed shifts: In: 1260 [P.O.:1260] Out: 2450 [Urine:2450]   Intake/Output this shift:  Total I/O In: 240 [P.O.:240] Out: -   Physical Exam: General: NAD, laying in bed  Head: Normocephalic, atraumatic. Moist oral mucosal membranes  Eyes: Anicteric, PERRL  Neck: Supple, trachea midline  Lungs:  Clear to auscultation  Heart: Regular rate and rhythm  Abdomen:  Soft, nontender,   Extremities:  + peripheral edema.  Neurologic: Nonfocal, moving all four extremities  Skin: No lesions  Access: none    Basic Metabolic Panel: Recent Labs  Lab 08/18/22 1046 08/19/22 0505 08/20/22 0540 08/20/22 1600 08/21/22 0619 08/22/22 0505 08/23/22 0516 08/24/22 0630  NA  --    < > 135  --  140 141 140 140  K  --    < > 2.7* 3.6 3.3* 3.7 3.4* 3.8  CL  --    < > 99  --  102 105 103 104  CO2  --    < > 24  --  26 27 27 26   GLUCOSE  --    < > 96  --  115* 139* 115* 120*  BUN  --    < > 37*  --  43* 55* 64* 66*  CREATININE  --    < > 1.64*  --  1.90* 2.28* 2.44* 2.43*  CALCIUM  --    < > 7.8*  --  8.0* 8.0* 7.9* 8.0*  MG 2.0  --  1.7  --   --   --  2.2 2.5*  PHOS  --   --   --   --   --   --  3.4 3.5   < > = values in this interval not displayed.    Liver Function Tests: Recent Labs  Lab 08/23/22 0516 08/24/22 0630  AST  --  141*  ALT  --  15  ALKPHOS  --  117  BILITOT  --  0.6  PROT  --  6.1*  ALBUMIN 2.1* 2.0*   No results for  input(s): "LIPASE", "AMYLASE" in the last 168 hours. No results for input(s): "AMMONIA" in the last 168 hours.  CBC: Recent Labs  Lab 08/18/22 1020 08/19/22 0505 08/20/22 0540 08/20/22 1830 08/21/22 0619 08/22/22 0505 08/23/22 0516 08/24/22 0920  WBC 10.8*   < > 9.8  --  12.0*  11.9* 10.4 12.4* 9.6  NEUTROABS 8.9*  --   --   --  9.6* 9.3*  --  8.2*  HGB 8.7*   < > 7.7* 9.0* 9.1*  9.1* 8.0* 9.0* 9.0*  HCT 27.8*   < > 24.0* 27.5* 27.9*  27.9* 25.3* 27.7* 27.6*  MCV 98.2   < > 93.8  --  93.0  92.7 94.1 93.9 93.6  PLT 282   < > 285  --  284  267 241 302 279   < > =  values in this interval not displayed.    Cardiac Enzymes: Recent Labs  Lab 08/23/22 1950  CKTOTAL 31*    BNP: Invalid input(s): "POCBNP"  CBG: Recent Labs  Lab 08/20/22 0508  GLUCAP 89    Microbiology: Results for orders placed or performed during the hospital encounter of 08/18/22  Blood culture (routine x 2)     Status: None   Collection Time: 08/18/22 10:45 AM   Specimen: BLOOD LEFT ARM  Result Value Ref Range Status   Specimen Description BLOOD LEFT ARM  Final   Special Requests   Final    BOTTLES DRAWN AEROBIC AND ANAEROBIC Blood Culture adequate volume   Culture   Final    NO GROWTH 5 DAYS Performed at [facility], [address] [place], [place] [address]    Report Status 08/23/2022 FINAL  Final  Resp Panel by RT-PCR (Flu A&B, Covid) Anterior Nasal Swab     Status: None   Collection Time: 08/18/22 10:46 AM   Specimen: Anterior Nasal Swab  Result Value Ref Range Status   SARS Coronavirus 2 by RT PCR NEGATIVE NEGATIVE Final    Comment: (NOTE) SARS-CoV-2 target nucleic acids are NOT DETECTED.  The SARS-CoV-2 RNA is generally detectable in upper respiratory specimens during the acute phase of infection. The lowest concentration of SARS-CoV-2 viral copies this assay can detect is 138 copies/mL. A negative result does not preclude SARS-Cov-2 infection and should not be used  as the sole basis for treatment or other patient management decisions. A negative result may occur with  improper specimen collection/handling, submission of specimen other than nasopharyngeal swab, presence of viral mutation(s) within the areas targeted by this assay, and inadequate number of viral copies(<138 copies/mL). A negative result must be combined with clinical observations, patient history, and epidemiological information. The expected result is Negative.  Fact Sheet for Patients:  BloggerCourse.com  Fact Sheet for Healthcare Providers:  SeriousBroker.it  This test is no t yet approved or cleared by the [place] FDA and  has been authorized for detection and/or diagnosis of SARS-CoV-2 by FDA under an Emergency Use Authorization (EUA). This EUA will remain  in effect (meaning this test can be used) for the duration of the COVID-19 declaration under Section 564(b)(1) of the Act, 21 U.S.C.section 360bbb-3(b)(1), unless the authorization is terminated  or revoked sooner.       Influenza A by PCR NEGATIVE NEGATIVE Final   Influenza B by PCR NEGATIVE NEGATIVE Final    Comment: (NOTE) The Xpert Xpress SARS-CoV-2/FLU/RSV plus assay is intended as an aid in the diagnosis of influenza from Nasopharyngeal swab specimens and should not be used as a sole basis for treatment. Nasal washings and aspirates are unacceptable for Xpert Xpress SARS-CoV-2/FLU/RSV testing.  Fact Sheet for Patients: BloggerCourse.com  Fact Sheet for Healthcare Providers: SeriousBroker.it  This test is not yet approved or cleared by the [place] FDA and has been authorized for detection and/or diagnosis of SARS-CoV-2 by FDA under an Emergency Use Authorization (EUA). This EUA will remain in effect (meaning this test can be used) for the duration of the COVID-19 declaration under Section 564(b)(1)  of the Act, 21 U.S.C. section 360bbb-3(b)(1), unless the authorization is terminated or revoked.  Performed at [facility], [address] [place], [place] [address]   Culture, blood (Routine X 2) w Reflex to ID Panel     Status: None   Collection Time: 08/18/22  9:19 PM   Specimen: BLOOD LEFT ARM  Result Value Ref Range Status   Specimen Description BLOOD LEFT ARM  Final   Special Requests   Final    BOTTLES DRAWN AEROBIC AND ANAEROBIC Blood Culture adequate volume   Culture   Final    NO GROWTH 5 DAYS Performed at [facility], [address] [place], [place] [address]    Report Status 08/23/2022 FINAL  Final  Respiratory (~20 pathogens) panel by PCR     Status: None   Collection Time: 08/21/22  1:50 PM   Specimen: Nasopharyngeal Swab; Respiratory  Result Value Ref Range Status   Adenovirus NOT DETECTED NOT DETECTED Final   Coronavirus 229E NOT DETECTED NOT DETECTED Final    Comment: (NOTE) The Coronavirus on the Respiratory Panel, DOES NOT test for the novel  Coronavirus (2019 nCoV)    Coronavirus HKU1 NOT DETECTED NOT DETECTED Final   Coronavirus NL63 NOT DETECTED NOT DETECTED Final   Coronavirus OC43 NOT DETECTED NOT DETECTED Final   Metapneumovirus NOT DETECTED NOT DETECTED Final   Rhinovirus / Enterovirus NOT DETECTED NOT DETECTED Final   Influenza A NOT DETECTED NOT DETECTED Final   Influenza B NOT DETECTED NOT DETECTED Final   Parainfluenza Virus 1 NOT DETECTED NOT DETECTED Final   Parainfluenza Virus 2 NOT DETECTED NOT DETECTED Final   Parainfluenza Virus 3 NOT DETECTED NOT DETECTED Final   Parainfluenza Virus 4 NOT DETECTED NOT DETECTED Final   Respiratory Syncytial Virus NOT DETECTED NOT DETECTED Final   Bordetella pertussis NOT DETECTED NOT DETECTED Final   Bordetella Parapertussis NOT DETECTED NOT DETECTED Final   Chlamydophila pneumoniae NOT DETECTED NOT DETECTED Final   Mycoplasma pneumoniae NOT DETECTED NOT DETECTED Final     Comment: Performed at [facility], [address], [place], [place] [address]  Culture, blood (Routine X 2) w Reflex to ID Panel     Status: None (Preliminary result)   Collection Time: 08/21/22  8:58 PM   Specimen: BLOOD  Result Value Ref Range Status   Specimen Description BLOOD LEFT WRIST  Final   Special Requests   Final    BOTTLES DRAWN AEROBIC AND ANAEROBIC Blood Culture adequate volume   Culture   Final    NO GROWTH 3 DAYS Performed at [facility], [address], [place], [place] [address]    Report Status PENDING  Incomplete  Culture, blood (Routine X 2) w Reflex to ID Panel     Status: None (Preliminary result)   Collection Time: 08/21/22 11:01 PM   Specimen: BLOOD  Result Value Ref Range Status   Specimen Description BLOOD BLOOD LEFT ARM  Final   Special Requests   Final    BOTTLES DRAWN AEROBIC AND ANAEROBIC Blood Culture adequate volume   Culture   Final    NO GROWTH 3 DAYS Performed at [facility], [address] [place], [place] [address]    Report Status PENDING  Incomplete  Expectorated Sputum Assessment w Gram Stain, Rflx to Resp Cult     Status: None   Collection Time: 08/22/22 11:22 AM   Specimen: Sputum  Result Value Ref Range Status   Specimen Description SPUTUM  Final   Special Requests EXPSU  Final   Sputum evaluation   Final    THIS SPECIMEN IS ACCEPTABLE FOR SPUTUM CULTURE Performed at [facility], [address], [place], [place] [address]    Report Status 08/22/2022 FINAL  Final  Culture, Respiratory w Gram Stain     Status: None (  Preliminary result)   Collection Time: 08/22/22 11:22 AM   Specimen: SPU  Result Value Ref Range Status   Specimen Description   Final    SPUTUM Performed at [facility], [address] [place], [place] [address]    Special Requests   Final    EXPSU Reflexed from [id number] Performed at [facility], [address] [place], [place] [address]    Gram Stain    Final    ABUNDANT SQUAMOUS EPITHELIAL CELLS PRESENT FEW WBC PRESENT, PREDOMINANTLY PMN ABUNDANT GRAM VARIABLE ROD ABUNDANT GRAM POSITIVE COCCI IN PAIRS    Culture   Final    CULTURE REINCUBATED FOR BETTER GROWTH Performed at [facility], [address], [place], [place] [address]    Report Status PENDING  Incomplete    Coagulation Studies: No results for input(s): "LABPROT", "INR" in the last 72 hours.  Urinalysis: Recent Labs    08/23/22 1039  COLORURINE YELLOW*  LABSPEC 1.015  PHURINE 5.0  GLUCOSEU NEGATIVE  HGBUR MODERATE*  BILIRUBINUR NEGATIVE  KETONESUR NEGATIVE  PROTEINUR 100*  NITRITE NEGATIVE  LEUKOCYTESUR NEGATIVE      Imaging: US RENAL  Result Date: 08/23/2022 CLINICAL DATA:  Acute kidney insufficiency EXAM: RENAL / URINARY TRACT ULTRASOUND COMPLETE COMPARISON:  Renal ultrasound 08/02/2022. FINDINGS: Right Kidney: Renal measurements: 11.0 x 4.5 x 5.3 cm = volume: 137.8 mL. Echogenicity within normal limits. No mass or hydronephrosis visualized. Left Kidney: Renal measurements: 12.8 x 7.0 x 5.2 cm = volume: 244.7 mL. Echogenicity within normal limits. No mass or hydronephrosis visualized. Bladder: Appears normal for degree of bladder distention. Other: None. IMPRESSION: No collecting system dilatation of either kidney. Electronically Signed   By: Karen Kays M.D.   On: 08/23/2022 16:17   [place] CHEST (PLEURAL EFFUSION)  Result Date: 08/23/2022 CLINICAL DATA:  [id number] Pleural effusion on right [id number] EXAM: CHEST ULTRASOUND COMPARISON:  None Available. FINDINGS: Focused sonographic exam of the right chest was performed. There is a small volume right pleural effusion with intervening lung parenchyma. No safe window for image guided thoracentesis. No intervention performed. IMPRESSION: Small volume right pleural effusion with intervening lung parenchyma. No safe window for image guided thoracentesis. Electronically Signed   By: Olive Bass M.D.   On: 08/23/2022  16:06     Medications:    ceFAZolin 2 g (08/24/22 0906)    aspirin EC  81 mg Oral QHS   Chlorhexidine Gluconate Cloth  6 each Topical Daily   cyanocobalamin  1,000 mcg Oral Daily   dexamethasone (DECADRON) injection  8 mg Intravenous Q24H   diltiazem  360 mg Oral Daily   feeding supplement  237 mL Oral BID BM   metoprolol succinate  100 mg Oral Daily   multivitamins with iron  1 tablet Oral Daily   pantoprazole  40 mg Oral Daily   polyethylene glycol  17 g Oral Daily   rosuvastatin  40 mg Oral Daily   senna  1 tablet Oral Daily   acetaminophen, albuterol, dextromethorphan-guaiFENesin, hydrALAZINE, melatonin, ondansetron (ZOFRAN) IV, simethicone, sodium chloride, traMADol  Assessment/ Plan:  Mr. GRAYER SPROLES is a 87 y.o.  male with recent prolonged admission for aortic ulcer s/p endovascular repair 07/24/22, MSSA bacteremia with L spine osteomyelitis and discitis, chronic diastolic heart failure, hypertension, hyperlipidemia, coronary artery disease, atrial fibrillation who was admitted to [facility] on 08/18/2022 for HCAP (healthcare-associated pneumonia) [J18.9] Acute on chronic respiratory failure with hypoxia (HCC) [J96.21] Sepsis, due to unspecified organism, unspecified whether acute organ  dysfunction present (HCC) [A41.9]     1.  Acute kidney injury with proteinuria and hematuria: Baseline creatinine of 1.15 with normal GFR on 5/29. Serologic work up pending. Serum complements normal. However differential includes AIN, ATN and prerenal azotemia from overdiuresis. No obstruction on renal ultrasound. No recent IV contrast exposure. Nonoliguric urine output. No acute indication for dialysis.  - Currently not getting IV fluids. Not on diuretics.  - holding lisinopril - Discussed role of kidney biopsy in diagnosis with patient and wife.    2.  Acute on chronic diastolic heart failure.  Ejection fraction 55 to 60% with grade 1 diastolic dysfunction noted.  Currently holding diuretics.  However with peripheral edema on examination today.   3.  Anemia with acute kidney injury: hemoglobin 9. PRBC transfusion on 6/18.     LOS: 6 Eamonn Sermeno 6/22/202412:57 PM

## 2022-08-24 NOTE — Progress Notes (Signed)
PROGRESS NOTE  Brett Riley NWG:[id number] DOB: June 14, 1935 DOA: 08/18/2022 PCP: Dale [place], MD  Hospital course: Brett Riley is a 87 y.o. male with medical history significant of chronic dCHF, HTN, HLD, CAD, CKD-3a, anemia, chronic A fib on Eliquis, who presents 08/18/2022 to ED from rehab, chief complaint SOB.  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 Net IO Since Admission: -5,549.14 mL [08/20/22 1608]. Heparin stopped d/t rectal bleeding overnight.  06/19: euvolemic per cardio, d/c lasix, O2 requirement still high, CT chest reviewed, ?edema/atypical infection, given clinically worsening respiratory status have asked pulmonary and ID to consult. Have d/c amiodarone and ordered steroids in case amio toxicity.  06/20: ID and pulmonary assisting with the management.  08/24/2022: patient feels well today. Patient requiring judicial adjustments to balance between hypervolemia and renal injury with diuresis. Unable to pull fluid from lungs with thoracentesis so medical management is best option. Respiratory status has improved. Weaned from 6L to 3L currently.   Assessment/Plan: Principal Problem:   Acute respiratory failure with hypoxia (HCC) Active Problems:   Acute on chronic heart failure with preserved ejection fraction (HFpEF) (HCC)   MSSA bacteremia   Vertebral osteomyelitis (HCC)   CAD (coronary artery disease)   Myocardial injury   Hypertension   Hyperlipidemia   Chronic kidney disease, stage 3a (HCC)   Normocytic anemia   Atrial fibrillation, chronic (HCC)    Paroxysmal atrial fibrillation (HCC)   HCAP (healthcare-associated pneumonia)   Sepsis (HCC)   Pleural effusion   Amiodarone pulmonary toxicity  Acute on chronic respiratory failure with hypoxia due to acute on chronic CHF  possible pneumonia, bilateral small pleural effusion- as seen on chest CT Not on oxygen supplementation at baseline Currently on 3L with O2 saturation in the mid to upper 90s. - wean as tolerated - f/u sputum culture results Pulmonary and infectious disease following. Continue BiPAP nightly. Completed course of azithromycin. - Continue Ancef.  Acute on chronic diastolic CHF 2D echo on 07/21/21 showed  EF of 55-60% with grade 1 diastolic dysfunction Limited echocardiogram revealed LVEF of 50-55% with RV systolic function is normal  BNP 673 on 08/21/22 - judicial use of diuretics and close monitoring of kidney function. Patient is hypervolemic on exam.  Continue strict I's and O's and daily weight.   Pleural effusion- IR unable to perform thoracentesis 6/20, 6/21. Medical management. Clinically improving respiratory status.  - periodic chest xray to monitor if not improving/worsening   Atrial fibrillation, chronic (HCC): Heart rate now well controlled in 70s diltiazem, lisinopril, metoprolol per cardiology Eliquis held given anemia. Restart as able   Recent hx of MSSA bacteremia vertebral osteomyelitis (HCC): continue IV Ancef  repeat Blood culture NGx2d   CAD  Myocardial injury  HLD:  trop  258, possibly due to demand ischemia secondary to CHF exacerbation.  Patient does not have chest pain. Trend troponin --> 1775 and heparin gtt started --> 1446 Heparin gtt held d/t anemia  Crestor, beta blocker, Aspirin 81 mg daily  Cardiology following  May need coronary angiography at some point but will defer for now    Chronic  kidney disease, stage 3a (HCC): Cr unchanged from yesterday 2.4>2.4. Baseline creatinine 1.5-1.9 Follow BMP  Normocytic anemia: hgb  stable at 9.0. S/p 1 unit PRBC 06/18 ABLA w/ rectal bleed   Hypertension Continue to closely monitor vital signs.   Resolved hypokalemia   GERD PPI    DVT prophylaxis: SCDs Pertinent IV fluids/nutrition: None Central lines / invasive devices: PICC in R Brachial     Code Status: DNR, see H&P, discuss w/ pt/family if respiratory deterioration requiring intubation, no CPR ACP documentation reviewed: 08/19/22 - HCPOA w/ 1) Dava Najjar, alternates 2) Erling Conte, 3) Sandra Cockayne FIelds.    Current Admission Status: inpatient  TOC needs / Dispo plan: TBD Barriers to discharge / significant pending items: cardiology recs  Status is: Inpatient The patient requires at least 2 midnights for further evaluation and treatment of present condition.  Objective: Vitals:   08/24/22 0230 08/24/22 0300 08/24/22 0330 08/24/22 0400  BP:    (!) 156/73  Pulse:      Resp: (!) 21 (!) 27 (!) 23 (!) 24  Temp:   98.3 F (36.8 C) 97.8 F (36.6 C)  TempSrc:   Oral Oral  SpO2:      Weight:      Height:        Intake/Output Summary (Last 24 hours) at 08/24/2022 1610 Last data filed at 08/24/2022 0600 Gross per 24 hour  Intake 960 ml  Output 2000 ml  Net -1040 ml    Filed Weights   08/20/22 0321 08/21/22 0437 08/23/22 0459  Weight: 90 kg 89.5 kg 90 kg    Exam:  General: 87 y.o. year-old male frail-appearing in no acute distress.  He is alert.  Hard of hearing. Cardiovascular: Irregular rate and rhythm no rubs or gallops. Respiratory: Mild diffuse rales bilaterally.  Poor inspiratory effort. Abdomen: Soft nontender nondistended with normal bowel sounds x4 quadrants. Musculoskeletal: 2+ pitting edema in lower extremities bilaterally. Skin: No ulcerative lesions noted or rashes, Psychiatry: Mood is appropriate for condition and setting  Data Reviewed: CBC: Recent Labs  Lab 08/18/22 1020 08/19/22 0505 08/20/22 0540 08/20/22 1830 08/21/22 0619 08/22/22 0505 08/23/22 0516  WBC  10.8* 9.3 9.8  --  12.0*  11.9* 10.4 12.4*  NEUTROABS 8.9*  --   --   --  9.6* 9.3*  --   HGB 8.7* 7.6* 7.7* 9.0* 9.1*  9.1* 8.0* 9.0*  HCT 27.8* 24.1* 24.0* 27.5* 27.9*  27.9* 25.3* 27.7*  MCV 98.2 96.0 93.8  --  93.0  92.7 94.1 93.9  PLT 282 245 285  --  284  267 241 302    Basic Metabolic Panel: Recent Labs  Lab 08/18/22 1046 08/19/22 0505 08/20/22 0540 08/20/22 1600 08/21/22 0619 08/22/22 0505 08/23/22 0516 08/24/22 0630  NA  --    < > 135  --  140 141 140 140  K  --    < > 2.7* 3.6 3.3* 3.7 3.4* 3.8  CL  --    < > 99  --  102 105 103 104  CO2  --    < > 24  --  26 27 27 26   GLUCOSE  --    < > 96  --  115* 139* 115* 120*  BUN  --    < > 37*  --  43* 55* 64* 66*  CREATININE  --    < > 1.64*  --  1.90* 2.28* 2.44* 2.43*  CALCIUM  --    < >  7.8*  --  8.0* 8.0* 7.9* 8.0*  MG 2.0  --  1.7  --   --   --  2.2 2.5*  PHOS  --   --   --   --   --   --  3.4 3.5   < > = values in this interval not displayed.    GFR: Estimated Creatinine Clearance: 24.8 mL/min (A) (by C-G formula based on SCr of 2.43 mg/dL (H)). Liver Function Tests: Recent Labs  Lab 08/23/22 0516 08/24/22 0630  AST  --  141*  ALT  --  15  ALKPHOS  --  117  BILITOT  --  0.6  PROT  --  6.1*  ALBUMIN 2.1* 2.0*    Coagulation Profile: Recent Labs  Lab 08/18/22 2033  INR 1.9*    Cardiac Enzymes: Recent Labs  Lab 08/23/22 1950  CKTOTAL 31*   Studies: US RENAL  Result Date: 08/23/2022 CLINICAL DATA:  Acute kidney insufficiency EXAM: RENAL / URINARY TRACT ULTRASOUND COMPLETE COMPARISON:  Renal ultrasound 08/02/2022. FINDINGS: Right Kidney: Renal measurements: 11.0 x 4.5 x 5.3 cm = volume: 137.8 mL. Echogenicity within normal limits. No mass or hydronephrosis visualized. Left Kidney: Renal measurements: 12.8 x 7.0 x 5.2 cm = volume: 244.7 mL. Echogenicity within normal limits. No mass or hydronephrosis visualized. Bladder: Appears normal for degree of bladder distention. Other: None. IMPRESSION: No  collecting system dilatation of either kidney. Electronically Signed   By: Karen Kays M.D.   On: 08/23/2022 16:17   [place] CHEST (PLEURAL EFFUSION)  Result Date: 08/23/2022 CLINICAL DATA:  [id number] Pleural effusion on right [id number] EXAM: CHEST ULTRASOUND COMPARISON:  None Available. FINDINGS: Focused sonographic exam of the right chest was performed. There is a small volume right pleural effusion with intervening lung parenchyma. No safe window for image guided thoracentesis. No intervention performed. IMPRESSION: Small volume right pleural effusion with intervening lung parenchyma. No safe window for image guided thoracentesis. Electronically Signed   By: Olive Bass M.D.   On: 08/23/2022 16:06    Scheduled Meds:  aspirin EC  81 mg Oral QHS   Chlorhexidine Gluconate Cloth  6 each Topical Daily   cyanocobalamin  1,000 mcg Oral Daily   dexamethasone (DECADRON) injection  8 mg Intravenous Q24H   diltiazem  360 mg Oral Daily   feeding supplement  237 mL Oral BID BM   metoprolol succinate  100 mg Oral Daily   multivitamins with iron  1 tablet Oral Daily   pantoprazole  40 mg Oral Daily   polyethylene glycol  17 g Oral Daily   rosuvastatin  40 mg Oral Daily   senna  1 tablet Oral Daily    Continuous Infusions:  ceFAZolin 2 g (08/23/22 2138)     LOS: 6 days    Leeroy Bock, MD [facility] Pager [phone]  If 7PM-7AM, please contact night-coverage [email or web address] Password [facility] 08/24/2022, 8:28 AM

## 2022-08-24 NOTE — Progress Notes (Addendum)
Progress Note  Patient Name: Brett Riley Date of Encounter: 08/24/2022  Primary Cardiologist: Kirke Corin  Subjective   Mains on 6 L O2 Went down for diagnostic and therapeutic thoracentesis yesterday but there was only trace fluid on the left and very small fluid pocket on the right that was not large enough to safely tap Put out 2 L yesterday and is net negative almost 7 L since admission no Lasix given yesterday Creatinine has now increased to 8 and nephrology has been consulted   Inpatient Medications    Scheduled Meds:  aspirin EC  81 mg Oral QHS   Chlorhexidine Gluconate Cloth  6 each Topical Daily   cyanocobalamin  1,000 mcg Oral Daily   dexamethasone (DECADRON) injection  8 mg Intravenous Q24H   diltiazem  360 mg Oral Daily   feeding supplement  237 mL Oral BID BM   metoprolol succinate  100 mg Oral Daily   multivitamins with iron  1 tablet Oral Daily   pantoprazole  40 mg Oral Daily   polyethylene glycol  17 g Oral Daily   rosuvastatin  40 mg Oral Daily   senna  1 tablet Oral Daily   Continuous Infusions:  ceFAZolin 2 g (08/23/22 2138)   PRN Meds: acetaminophen, albuterol, dextromethorphan-guaiFENesin, hydrALAZINE, melatonin, ondansetron (ZOFRAN) IV, simethicone, sodium chloride, traMADol   Vital Signs    Vitals:   08/24/22 0230 08/24/22 0300 08/24/22 0330 08/24/22 0400  BP:    (!) 156/73  Pulse:      Resp: (!) 21 (!) 27 (!) 23 (!) 24  Temp:   98.3 F (36.8 C) 97.8 F (36.6 C)  TempSrc:   Oral Oral  SpO2:      Weight:      Height:        Intake/Output Summary (Last 24 hours) at 08/24/2022 0841 Last data filed at 08/24/2022 0600 Gross per 24 hour  Intake 960 ml  Output 2000 ml  Net -1040 ml    Filed Weights   08/20/22 0321 08/21/22 0437 08/23/22 0459  Weight: 90 kg 89.5 kg 90 kg    Telemetry    Sinus rhythm- Personally Reviewed  ECG    No new tracings - Personally Reviewed  Physical Exam   GEN: Well nourished, well developed in no  acute distress HEENT: Normal NECK: No JVD; No carotid bruits LYMPHATICS: No lymphadenopathy CARDIAC:RRR, no murmurs, rubs, gallops RESPIRATORY:  Clear to auscultation without rales, wheezing or rhonchi  ABDOMEN: Soft, non-tender, non-distended MUSCULOSKELETAL:  1+ LE  edema; No deformity  SKIN: Warm and dry NEUROLOGIC:  Alert and oriented x 3 PSYCHIATRIC:  Normal affect   Labs    Chemistry Recent Labs  Lab 08/22/22 0505 08/23/22 0516 08/24/22 0630  NA 141 140 140  K 3.7 3.4* 3.8  CL 105 103 104  CO2 27 27 26   GLUCOSE 139* 115* 120*  BUN 55* 64* 66*  CREATININE 2.28* 2.44* 2.43*  CALCIUM 8.0* 7.9* 8.0*  PROT  --   --  6.1*  ALBUMIN  --  2.1* 2.0*  AST  --   --  141*  ALT  --   --  15  ALKPHOS  --   --  117  BILITOT  --   --  0.6  GFRNONAA 28* 25* 26*  ANIONGAP 9 10 10       Hematology Recent Labs  Lab 08/21/22 0619 08/22/22 0505 08/23/22 0516  WBC 12.0*  11.9* 10.4 12.4*  RBC 3.00*  3.01* 2.69*  2.95*  HGB 9.1*  9.1* 8.0* 9.0*  HCT 27.9*  27.9* 25.3* 27.7*  MCV 93.0  92.7 94.1 93.9  MCH 30.3  30.2 29.7 30.5  MCHC 32.6  32.6 31.6 32.5  RDW 14.2  14.1 14.2 14.1  PLT 284  267 241 302     Cardiac EnzymesNo results for input(s): "TROPONINI" in the last 168 hours. No results for input(s): "TROPIPOC" in the last 168 hours.   BNP Recent Labs  Lab 08/18/22 1020 08/21/22 1655  BNP 279.2* 673.3*      DDimer No results for input(s): "DDIMER" in the last 168 hours.   Radiology    CT CHEST WO CONTRAST  Result Date: 08/21/2022 IMPRESSION: Extensive bilateral upper and lower lobe interstitial opacities are noted most consistent with pulmonary edema or atypical infection. Moderate size bilateral pleural effusions are noted as well. 3.0 x 1.3 cm penetrating atherosclerotic ulcer or focal saccular aneurysm is seen involving distal portion of ascending thoracic aortic aneurysm. Extensive coronary artery calcifications are noted suggesting coronary artery  disease. Aortic Atherosclerosis (ICD10-I70.0). Electronically Signed   By: Lupita Raider M.D.   On: 08/21/2022 14:21    Cardiac Studies   Limited echo 08/19/2022: 1. Left ventricular ejection fraction, by estimation, is 50 to 55%. The  left ventricle has low normal function. Left ventricular endocardial  border not optimally defined to evaluate regional wall motion.   2. Right ventricular systolic function is normal.  __________  TEE 08/05/2022: 1. Left ventricular ejection fraction, by estimation, is 55 to 60%. The  left ventricle has normal function.   2. Right ventricular systolic function is normal. The right ventricular  size is normal.   3. No left atrial/left atrial appendage thrombus was detected.   4. The mitral valve is normal in structure. Mild mitral valve  regurgitation.   5. The aortic valve is tricuspid. Aortic valve regurgitation is trivial.   Patient Profile     87 y.o. 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   Assessment & Plan    Acute respiratory failure Initially treated with IV Lasix twice daily, Lasix held for worsening renal function Etiology unclear,  CT scan with interstitial opacities, concerning for atypical infection especially in light of worsening renal function with diuresis,  Bilateral pleural effusions -Down yesterday for attempted thoracentesis but there was only trivial fluid on the left and very small fluid pocket on the right not large enough for removal -Continue ongoing antibiotic therapy per TR[facility]   Acute on chronic HFpEF -shortness of breath on admission -Limited echocardiogram revealed LVEF of 50-55% with RV systolic function is  normal -Treated with IV Lasix, -6 L diuretics stopped due to worsening renal function -Serum creatinine remains elevated at 2.43 and nephrology is following -No SGLT2i due to AKI -Holding diuretics per nephrology   3. NSTEMI versus demand ischemia  Likely supply/demand mismatch in the setting of pneumonitis/hypoxia,  fever, infection, anemia -High-sensitivity troponins  peaked at 1775 -Denies significant symptoms concerning for ischemia, no angina -No ischemic changes noted on EKG -Initially on heparin but this was stopped due to rectal bleeding -No plans for ischemic workup at this time given renal dysfunction, anemia, rectal bleeding   4.Symptomatic anemia -Hbg 9 yesterday after blood transfusions.  CBC  pending today -Heparin infusion  stopped due to rectal bleeding    5.  Primary hypertension -BP borderline controlled on current medical regimen  -Dose of Lasix and lisinopril held AKI -Continue Cardizem CD 360 mg daily, Toprol-XL 100 mg daily   6.  Hyperlipidemia -Continued on rosuvastatin   7.  Paroxysmal atrial fibrillation -Remains in normal sinus rhythm on telemetry -He underwent stopped in the setting of underlying lung disease, previously on 200 twice daily -Continue Cardizem CD 360 mg daily and Toprol-XL 100 mg daily  -Apixaban was placed on hold while he was on heparin infusion, IV heparin has been discontinued due to rectal bleeding  8. Chronic kidney disease stage IIIa -Serum creatinine was normal at 0.9 back in May  -Creatinine remains elevated at 2.43 despite holding diuretics  -Differential diagnosis includes interstitial nephritis from use of cefazolin, postinfectious pulmonary nephritis and possibly overdiuresis  -Renal is following AKI on    9. Recent history of MSSA bacteremia with vertebral osteomyelitis -Continued on IV antibiotic therapy -ID following     I have spent a total of 45 minutes with patient reviewing various consultant notes, 2D echo ,  telemetry, EKGs, labs and examining patient as well as establishing an assessment and plan that was discussed with the patient.  > 50% of time was spent in direct patient care.    For questions or updates, please contact [facility] Please consult [email or web address] for contact info under Cardiology/STEMI.    Signed, Armanda Magic, MD [facility] 08/24/2022

## 2022-08-25 ENCOUNTER — Inpatient Hospital Stay: Payer: Medicare HMO

## 2022-08-25 DIAGNOSIS — Z9889 Other specified postprocedural states: Secondary | ICD-10-CM

## 2022-08-25 DIAGNOSIS — I5031 Acute diastolic (congestive) heart failure: Secondary | ICD-10-CM | POA: Diagnosis not present

## 2022-08-25 DIAGNOSIS — A419 Sepsis, unspecified organism: Secondary | ICD-10-CM

## 2022-08-25 DIAGNOSIS — J90 Pleural effusion, not elsewhere classified: Secondary | ICD-10-CM | POA: Diagnosis not present

## 2022-08-25 DIAGNOSIS — N179 Acute kidney failure, unspecified: Secondary | ICD-10-CM | POA: Diagnosis not present

## 2022-08-25 DIAGNOSIS — I5030 Unspecified diastolic (congestive) heart failure: Secondary | ICD-10-CM

## 2022-08-25 DIAGNOSIS — J9601 Acute respiratory failure with hypoxia: Secondary | ICD-10-CM | POA: Diagnosis not present

## 2022-08-25 DIAGNOSIS — I10 Essential (primary) hypertension: Secondary | ICD-10-CM | POA: Diagnosis not present

## 2022-08-25 LAB — C-REACTIVE PROTEIN: CRP: 5.3 mg/dL — ABNORMAL HIGH

## 2022-08-25 LAB — CULTURE, RESPIRATORY W GRAM STAIN: Culture: NORMAL

## 2022-08-25 LAB — BASIC METABOLIC PANEL WITH GFR
Anion gap: 10 (ref 5–15)
BUN: 73 mg/dL — ABNORMAL HIGH (ref 8–23)
CO2: 25 mmol/L (ref 22–32)
Calcium: 8 mg/dL — ABNORMAL LOW (ref 8.9–10.3)
Chloride: 105 mmol/L (ref 98–111)
Creatinine, Ser: 2.46 mg/dL — ABNORMAL HIGH (ref 0.61–1.24)
GFR, Estimated: 25 mL/min — ABNORMAL LOW
Glucose, Bld: 133 mg/dL — ABNORMAL HIGH (ref 70–99)
Potassium: 3.7 mmol/L (ref 3.5–5.1)
Sodium: 140 mmol/L (ref 135–145)

## 2022-08-25 LAB — CBC
HCT: 26.5 % — ABNORMAL LOW (ref 39.0–52.0)
Hemoglobin: 8.5 g/dL — ABNORMAL LOW (ref 13.0–17.0)
MCH: 29.7 pg (ref 26.0–34.0)
MCHC: 32.1 g/dL (ref 30.0–36.0)
MCV: 92.7 fL (ref 80.0–100.0)
Platelets: 255 10*3/uL (ref 150–400)
RBC: 2.86 MIL/uL — ABNORMAL LOW (ref 4.22–5.81)
RDW: 13.8 % (ref 11.5–15.5)
WBC: 10.5 10*3/uL (ref 4.0–10.5)
nRBC: 0 % (ref 0.0–0.2)

## 2022-08-25 MED ORDER — APIXABAN 5 MG PO TABS: 5.0000 mg | ORAL_TABLET | Freq: Two times a day (BID) | ORAL | Status: DC

## 2022-08-25 MED ORDER — DEXAMETHASONE SODIUM PHOSPHATE 10 MG/ML IJ SOLN
4.0000 mg | INTRAMUSCULAR | Status: DC
  Administered 2022-08-26: 4 mg via INTRAVENOUS
  Filled 2022-08-25: qty 1

## 2022-08-25 MED ORDER — APIXABAN 2.5 MG PO TABS
2.5000 mg | ORAL_TABLET | Freq: Two times a day (BID) | ORAL | Status: DC
  Administered 2022-08-26 – 2022-09-25 (×61): 2.5 mg via ORAL
  Filled 2022-08-25 (×61): qty 1

## 2022-08-25 MED ORDER — MELATONIN 5 MG PO TABS
2.5000 mg | ORAL_TABLET | Freq: Every evening | ORAL | Status: DC | PRN
  Administered 2022-08-25 – 2022-09-23 (×13): 2.5 mg via ORAL
  Filled 2022-08-25 (×14): qty 1

## 2022-08-25 MED ORDER — ALBUMIN HUMAN 25 % IV SOLN
25.0000 g | Freq: Two times a day (BID) | INTRAVENOUS | Status: AC
  Administered 2022-08-25 – 2022-08-27 (×6): 25 g via INTRAVENOUS
  Filled 2022-08-25 (×6): qty 100

## 2022-08-25 NOTE — Plan of Care (Signed)
°  Problem: Education: °Goal: Ability to demonstrate management of disease process will improve °Outcome: Progressing °Goal: Ability to verbalize understanding of medication therapies will improve °Outcome: Progressing °Goal: Individualized Educational Video(s) °Outcome: Progressing °  °Problem: Activity: °Goal: Capacity to carry out activities will improve °Outcome: Progressing °  °Problem: Cardiac: °Goal: Ability to achieve and maintain adequate cardiopulmonary perfusion will improve °Outcome: Progressing °  °Problem: Activity: °Goal: Ability to tolerate increased activity will improve °Outcome: Progressing °  °Problem: Clinical Measurements: °Goal: Ability to maintain a body temperature in the normal range will improve °Outcome: Progressing °  °Problem: Respiratory: °Goal: Ability to maintain adequate ventilation will improve °Outcome: Progressing °Goal: Ability to maintain a clear airway will improve °Outcome: Progressing °  °Problem: Education: °Goal: Knowledge of General Education information will improve °Description: Including pain rating scale, medication(s)/side effects and non-pharmacologic comfort measures °Outcome: Progressing °  °Problem: Health Behavior/Discharge Planning: °Goal: Ability to manage health-related needs will improve °Outcome: Progressing °  °Problem: Clinical Measurements: °Goal: Ability to maintain clinical measurements within normal limits will improve °Outcome: Progressing °Goal: Will remain free from infection °Outcome: Progressing °Goal: Diagnostic test results will improve °Outcome: Progressing °Goal: Respiratory complications will improve °Outcome: Progressing °Goal: Cardiovascular complication will be avoided °Outcome: Progressing °  °Problem: Activity: °Goal: Risk for activity intolerance will decrease °Outcome: Progressing °  °Problem: Nutrition: °Goal: Adequate nutrition will be maintained °Outcome: Progressing °  °Problem: Coping: °Goal: Level of anxiety will  decrease °Outcome: Progressing °  °Problem: Elimination: °Goal: Will not experience complications related to bowel motility °Outcome: Progressing °Goal: Will not experience complications related to urinary retention °Outcome: Progressing °  °Problem: Pain Managment: °Goal: General experience of comfort will improve °Outcome: Progressing °  °Problem: Safety: °Goal: Ability to remain free from injury will improve °Outcome: Progressing °  °Problem: Skin Integrity: °Goal: Risk for impaired skin integrity will decrease °Outcome: Progressing °  °

## 2022-08-25 NOTE — Progress Notes (Addendum)
Progress Note  Patient Name: Brett Riley Date of Encounter: 08/25/2022  Primary Cardiologist: Kirke Corin  Subjective   Now down to 3 L nasal cannula O2 with O2 saturations 94% Put out 2.2 L yesterday and is net negative 8.5 L since admission after getting Lasix 60 mg yesterday afternoon and early this morning ordered by [facility] Renal  is following  Inpatient Medications    Scheduled Meds:  aspirin EC  81 mg Oral QHS   Chlorhexidine Gluconate Cloth  6 each Topical Daily   cyanocobalamin  1,000 mcg Oral Daily   dexamethasone (DECADRON) injection  8 mg Intravenous Q24H   diltiazem  360 mg Oral Daily   feeding supplement  237 mL Oral BID BM   furosemide  60 mg Intravenous Q12H   metoprolol succinate  100 mg Oral Daily   multivitamins with iron  1 tablet Oral Daily   pantoprazole  40 mg Oral Daily   polyethylene glycol  17 g Oral Daily   rosuvastatin  40 mg Oral Daily   senna  1 tablet Oral Daily   Continuous Infusions:  ceFAZolin 2 g (08/25/22 0843)   PRN Meds: acetaminophen, albuterol, dextromethorphan-guaiFENesin, ondansetron (ZOFRAN) IV, simethicone, sodium chloride, traMADol   Vital Signs    Vitals:   08/24/22 2100 08/25/22 0000 08/25/22 0400 08/25/22 0811  BP:  (!) 142/76 (!) 155/74 (!) 143/66  Pulse:  63 61 69  Resp:  (!) 22 19 (!) 21  Temp:  97.7 F (36.5 C) 97.9 F (36.6 C) 97.6 F (36.4 C)  TempSrc:  Oral Axillary Oral  SpO2: 91% 91% 94% 94%  Weight:      Height:        Intake/Output Summary (Last 24 hours) at 08/25/2022 1610 Last data filed at 08/25/2022 0645 Gross per 24 hour  Intake 580 ml  Output 2200 ml  Net -1620 ml    Filed Weights   08/20/22 0321 08/21/22 0437 08/23/22 0459  Weight: 90 kg 89.5 kg 90 kg    Telemetry  Normal sinus rhythm- Personally Reviewed  ECG    No new tracings - Personally Reviewed  Physical Exam   GEN: Well nourished, well developed in no acute distress HEENT: Normal NECK: No JVD; No carotid bruits LYMPHATICS:  No lymphadenopathy CARDIAC:RRR, no murmurs, rubs, gallops RESPIRATORY:  Clear to auscultation without rales, wheezing or rhonchi  ABDOMEN: Soft, non-tender, non-distended MUSCULOSKELETAL: 2+ BLE edema; No deformity  SKIN: Warm and dry NEUROLOGIC:  Alert and oriented x 3 PSYCHIATRIC:  Normal affect  Labs    Chemistry Recent Labs  Lab 08/23/22 0516 08/24/22 0630 08/25/22 0630  NA 140 140 140  K 3.4* 3.8 3.7  CL 103 104 105  CO2 27 26 25   GLUCOSE 115* 120* 133*  BUN 64* 66* 73*  CREATININE 2.44* 2.43* 2.46*  CALCIUM 7.9* 8.0* 8.0*  PROT  --  6.1*  --   ALBUMIN 2.1* 2.0*  --   AST  --  141*  --   ALT  --  15  --   ALKPHOS  --  117  --   BILITOT  --  0.6  --   GFRNONAA 25* 26* 25*  ANIONGAP 10 10 10       Hematology Recent Labs  Lab 08/23/22 0516 08/24/22 0920 08/25/22 0630  WBC 12.4* 9.6 10.5  RBC 2.95* 2.95* 2.86*  HGB 9.0* 9.0* 8.5*  HCT 27.7* 27.6* 26.5*  MCV 93.9 93.6 92.7  MCH 30.5 30.5 29.7  MCHC 32.5  32.6 32.1  RDW 14.1 14.1 13.8  PLT 302 279 255     Cardiac EnzymesNo results for input(s): "TROPONINI" in the last 168 hours. No results for input(s): "TROPIPOC" in the last 168 hours.   BNP Recent Labs  Lab 08/18/22 1020 08/21/22 1655  BNP 279.2* 673.3*      DDimer No results for input(s): "DDIMER" in the last 168 hours.   Radiology    CT CHEST WO CONTRAST  Result Date: 08/21/2022 IMPRESSION: Extensive bilateral upper and lower lobe interstitial opacities are noted most consistent with pulmonary edema or atypical infection. Moderate size bilateral pleural effusions are noted as well. 3.0 x 1.3 cm penetrating atherosclerotic ulcer or focal saccular aneurysm is seen involving distal portion of ascending thoracic aortic aneurysm. Extensive coronary artery calcifications are noted suggesting coronary artery disease. Aortic Atherosclerosis (ICD10-I70.0). Electronically Signed   By: Lupita Raider M.D.   On: 08/21/2022 14:21    Cardiac Studies    Limited echo 08/19/2022: 1. Left ventricular ejection fraction, by estimation, is 50 to 55%. The  left ventricle has low normal function. Left ventricular endocardial  border not optimally defined to evaluate regional wall motion.   2. Right ventricular systolic function is normal.  __________  TEE 08/05/2022: 1. Left ventricular ejection fraction, by estimation, is 55 to 60%. The  left ventricle has normal function.   2. Right ventricular systolic function is normal. The right ventricular  size is normal.   3. No left atrial/left atrial appendage thrombus was detected.   4. The mitral valve is normal in structure. Mild mitral valve  regurgitation.   5. The aortic valve is tricuspid. Aortic valve regurgitation is trivial.   Patient Profile     87 y.o. 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   Assessment & Plan    Acute respiratory failure Initially treated with IV Lasix twice daily, Lasix held for worsening renal function Etiology unclear,  CT scan with interstitial opacities, concerning for atypical infection especially in light of worsening renal function with diuresis,  Bilateral pleural effusions -attempted thoracentesis Friday 6/21 but there was only trivial fluid on the left and very small fluid pocket on the right not large enough for removal -Continue ongoing antibiotic therapy per TRH   Acute on chronic HFpEF -shortness of breath on admission -Limited echocardiogram revealed LVEF of 50-55% with RV systolic function is normal -Has had intermittent doses of IV Lasix with the last being 60 mg yesterday afternoon and early this morning.  He put out 2.2 L yesterday and is  net -8.5 L since admission -Serum creatinine remains slightly elevated more today from 2.43>>>2.46 -He still has lower extremity edema on exam that is likely being exacerbated by his significant hypoalbuminemia and I am going to add on compression hose -consider giving albumin along with his Lasix so that we do not promote intravascular volume depletion with diuretics alone>>will defer to nephrology -No SGLT2i due to AKI -Nephrology managing diuretics   3. NSTEMI versus demand ischemia  Likely supply/demand mismatch in the setting of pneumonitis/hypoxia,  fever, infection, anemia -High-sensitivity troponins  peaked at 1775 -Denies any anginal symptoms -No ischemic changes noted on EKG -Initially on heparin but this was stopped due to rectal bleeding -No plans for  ischemic workup at this time given renal dysfunction, anemia, rectal bleeding -Continue aspirin 81 mg daily, beta-blocker and statin therapy   4.Symptomatic anemia -Hbg 8.5 today -Heparin infusion  stopped due to rectal bleeding    5.  Primary hypertension -BP stable at 43 over 66 mmHg -lisinopril held AKI -Continue Cardizem CD 360 mg daily and Toprol-XL 100 mg daily   6.  Hyperlipidemia -Continued on rosuvastatin   7.  Paroxysmal atrial fibrillation -Tele reviewed and he continues to maintain normal sinus rhythm -Amiodarone stopped in the setting of underlying lung disease, previously on 200 twice daily -Continue Cardizem CD3 160 mg daily and Toprol-XL 100 mg daily -Apixaban was placed on hold while he was on heparin infusion, IV heparin has been discontinued due to rectal bleeding  8.  AKI on chronic kidney disease stage IIIa -Serum creatinine was normal at 0.9 back in May  -Creatinine remains elevated at 2.43>>2.46 -Differential diagnosis includes interstitial nephritis from use of cefazolin, postinfectious pulmonary nephritis and possibly overdiuresis  -Renal is following AKI  -TRH placed back on Lasix 60mg  IV BID  yesterday   9. Recent history of MSSA bacteremia with vertebral osteomyelitis -Continued on IV antibiotic therapy -ID following     I have spent a total of 35 minutes with patient reviewing various consultant notes, 2D echo , telemetry, EKGs, labs and examining patient as well as establishing an assessment and plan that was discussed with the patient.  > 50% of time was spent in direct patient care.    For questions or updates, please contact [facility] Please consult [email or web address] for contact info under Cardiology/STEMI.    Signed, Armanda Magic, MD [facility] 08/24/2022

## 2022-08-25 NOTE — Progress Notes (Signed)
PROGRESS NOTE  Brett Riley:[id number] DOB: 1935-09-15 DOA: 08/18/2022 PCP: [name], MD  Hospital course: Brett Riley is a 87 y.o. male with medical history significant of chronic dCHF, HTN, HLD, CAD, CKD-3a, anemia, chronic A fib on Eliquis, who presents 08/18/2022 to ED from rehab, chief complaint SOB.  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 Net IO Since Admission: -5,549.14 mL [08/20/22 1608]. Heparin stopped d/t rectal bleeding overnight.  06/19: euvolemic per cardio, d/c lasix, O2 requirement still high, CT chest reviewed, ?edema/atypical infection, given clinically worsening respiratory status have asked pulmonary and ID to consult. Have d/c amiodarone and ordered steroids in case amio toxicity.  06/20: ID and pulmonary assisting with the management.  08/24/2022: patient feels well today. Patient requiring judicial adjustments to balance between hypervolemia and renal injury with diuresis. Unable to pull fluid from lungs with thoracentesis so medical management is best option. Respiratory status has improved. Weaned from 6L to 3L currently.   Assessment/Plan: Principal Problem:   Acute respiratory failure with hypoxia (HCC) Active Problems:   Acute on chronic heart failure with preserved ejection fraction (HFpEF) (HCC)   MSSA bacteremia   Vertebral osteomyelitis (HCC)   CAD (coronary artery disease)   Myocardial injury   Hypertension   Hyperlipidemia   Chronic kidney disease, stage 3a (HCC)   Normocytic anemia   Atrial fibrillation, chronic (HCC)    Paroxysmal atrial fibrillation (HCC)   HCAP (healthcare-associated pneumonia)   Sepsis (HCC)   Pleural effusion   Amiodarone pulmonary toxicity   Acute on chronic HFrEF (heart failure with reduced ejection fraction) (HCC)   Pleural effusion on right  Acute on chronic respiratory failure with hypoxia due to acute on chronic CHF  possible pneumonia, bilateral small pleural effusion- as seen on chest CT Not on oxygen supplementation at baseline Currently on 2L with O2 saturation in upper 90s. - wean as tolerated - f/u sputum culture results- NGTD -Completed course of azithromycin. - Continue Ancef as written for his osteomyelitis  - incentive spirometer q1hr  Acute on chronic diastolic CHF- improving. Down about 2kg since 6/16. Echo 6/17: LVEF of 50-55% with RV systolic function is normal . BNP 673 on 08/21/22 - judicial use of diuretics and close monitoring of kidney function. Patient is hypervolemic on exam and improved from yesterday - add on albumin per nephro - TED stocking - Continue strict I's and O's and daily weight.   Pleural effusion- IR unable to perform thoracentesis 6/20, 6/21. Medical management. Clinically improving respiratory status.  - periodic chest xray to monitor if not improving/worsening   Atrial fibrillation, chronic (HCC): Heart rate now well controlled in 70s diltiazem, lisinopril, metoprolol per cardiology Restarting eliquis tomorrow in setting of no new bleeding and patient's hgb has been stable >5 days  Recent hx of MSSA bacteremia vertebral osteomyelitis (HCC): continue IV Ancef  repeat Blood culture NGTD   CAD  Myocardial injury  HLD:  trop  258, possibly due to demand ischemia secondary to CHF exacerbation.  Patient does not have chest pain. Crestor, beta blocker, Aspirin 81 mg daily  Cardiology following  May need coronary angiography at some point but will defer for now    Chronic kidney disease, stage 3a [facility]): Cr stable 2.4>2.4 even with  use of diuretics. Baseline creatinine 1.5-1.9 Follow BMP  Normocytic anemia: hgb stable around 9. S/p 1 unit PRBC 06/18 ABLA w/ rectal bleed   Hypertension Continue to closely monitor vital signs.   Resolved hypokalemia   GERD PPI    DVT prophylaxis: SCDs Pertinent IV fluids/nutrition: None Central lines / invasive devices: PICC in R Brachial     Code Status: DNR, see H&P, discuss w/ pt/family if respiratory deterioration requiring intubation, no CPR ACP documentation reviewed: 08/19/22 - HCPOA w/ 1) Dava Najjar, alternates 2) Erling Conte, 3) Sandra Cockayne FIelds.    Current Admission Status: inpatient  TOC needs / Dispo plan: TBD Barriers to discharge / significant pending items: cardiology recs  Status is: Inpatient The patient requires at least 2 midnights for further evaluation and treatment of present condition.  Objective: Vitals:   08/24/22 2000 08/24/22 2100 08/25/22 0000 08/25/22 0400  BP: (!) 143/70  (!) 142/76 (!) 155/74  Pulse: 69  63 61  Resp: (!) 21  (!) 22 19  Temp: 97.9 F (36.6 C)  97.7 F (36.5 C) 97.9 F (36.6 C)  TempSrc: Oral  Oral Axillary  SpO2: 94% 91% 91% 94%  Weight:      Height:        Intake/Output Summary (Last 24 hours) at 08/25/2022 0746 Last data filed at 08/25/2022 0645 Gross per 24 hour  Intake 580 ml  Output 2200 ml  Net -1620 ml    Filed Weights   08/20/22 0321 08/21/22 0437 08/23/22 0459  Weight: 90 kg 89.5 kg 90 kg    Exam:  General: 87 y.o. year-old male frail-appearing in no acute distress.  He is alert.  Hard of hearing. Cardiovascular: Irregular rate and rhythm no rubs or gallops. Respiratory: Mild diffuse rales bilaterally.  Poor inspiratory effort. Abdomen: Soft nontender nondistended with normal bowel sounds x4 quadrants. Musculoskeletal: 2+ pitting edema in lower extremities bilaterally. Skin: No ulcerative lesions noted or rashes, Psychiatry: Mood is appropriate for condition and setting  Data  Reviewed: CBC: Recent Labs  Lab 08/18/22 1020 08/19/22 0505 08/21/22 0619 08/22/22 0505 08/23/22 0516 08/24/22 0920 08/25/22 0630  WBC 10.8*   < > 12.0*  11.9* 10.4 12.4* 9.6 10.5  NEUTROABS 8.9*  --  9.6* 9.3*  --  8.2*  --   HGB 8.7*   < > 9.1*  9.1* 8.0* 9.0* 9.0* 8.5*  HCT 27.8*   < > 27.9*  27.9* 25.3* 27.7* 27.6* 26.5*  MCV 98.2   < > 93.0  92.7 94.1 93.9 93.6 92.7  PLT 282   < > 284  267 241 302 279 255   < > = values in this interval not displayed.    Basic Metabolic Panel: Recent Labs  Lab 08/18/22 1046 08/19/22 0505 08/20/22 0540 08/20/22 1600 08/21/22 0619 08/22/22 0505 08/23/22 0516 08/24/22 0630 08/25/22 0630  NA  --    < > 135  --  140 141 140 140 140  K  --    < > 2.7*   < > 3.3* 3.7 3.4* 3.8 3.7  CL  --    < > 99  --  102 105 103 104 105  CO2  --    < > 24  --  26 27 27 26 25   GLUCOSE  --    < > 96  --  115* 139* 115* 120* 133*  BUN  --    < > 37*  --  43* 55* 64* 66* 73*  CREATININE  --    < > 1.64*  --  1.90* 2.28* 2.44* 2.43* 2.46*  CALCIUM  --    < > 7.8*  --  8.0* 8.0* 7.9* 8.0* 8.0*  MG 2.0  --  1.7  --   --   --  2.2 2.5*  --   PHOS  --   --   --   --   --   --  3.4 3.5  --    < > = values in this interval not displayed.    GFR: Estimated Creatinine Clearance: 24.5 mL/min (A) (by C-G formula based on SCr of 2.46 mg/dL (H)). Liver Function Tests: Recent Labs  Lab 08/23/22 0516 08/24/22 0630  AST  --  141*  ALT  --  15  ALKPHOS  --  117  BILITOT  --  0.6  PROT  --  6.1*  ALBUMIN 2.1* 2.0*    Coagulation Profile: Recent Labs  Lab 08/18/22 2033  INR 1.9*    Cardiac Enzymes: Recent Labs  Lab 08/23/22 1950  CKTOTAL 31*    Studies: No results found.  Scheduled Meds:  aspirin EC  81 mg Oral QHS   Chlorhexidine Gluconate Cloth  6 each Topical Daily   cyanocobalamin  1,000 mcg Oral Daily   dexamethasone (DECADRON) injection  8 mg Intravenous Q24H   diltiazem  360 mg Oral Daily   feeding supplement  237 mL Oral BID BM    furosemide  60 mg Intravenous Q12H   metoprolol succinate  100 mg Oral Daily   multivitamins with iron  1 tablet Oral Daily   pantoprazole  40 mg Oral Daily   polyethylene glycol  17 g Oral Daily   rosuvastatin  40 mg Oral Daily   senna  1 tablet Oral Daily    Continuous Infusions:  ceFAZolin 2 g (08/24/22 2121)     LOS: 7 days    Leeroy Bock, MD [facility] Pager [phone]  If 7PM-7AM, please contact night-coverage [email or web address] Password [facility] 08/25/2022, 7:47 AM

## 2022-08-25 NOTE — Progress Notes (Signed)
[facility]  ROUNDING NOTE   Subjective:   Started on IV furosemide 60mg  IV q12  Wife at bedside.  UOP 2.2L   Patient reports eating well.   Patient states he is breathing better.   Objective:  Vital signs in last 24 hours:  Temp:  [97.6 F (36.4 C)-97.9 F (36.6 C)] 97.6 F (36.4 C) (06/23 0811) Pulse Rate:  [61-75] 69 (06/23 0811) Resp:  [19-25] 21 (06/23 0811) BP: (131-155)/(66-76) 143/66 (06/23 0811) SpO2:  [88 %-94 %] 94 % (06/23 0811)  Weight change:  Filed Weights   08/20/22 0321 08/21/22 0437 08/23/22 0459  Weight: 90 kg 89.5 kg 90 kg    Intake/Output: I/O last 3 completed shifts: In: 1060 [P.O.:960; IV Piggyback:100] Out: 3500 [Urine:3500]   Intake/Output this shift:  No intake/output data recorded.  Physical Exam: General: NAD, laying in bed  Head: Normocephalic, atraumatic. Moist oral mucosal membranes  Eyes: Anicteric, PERRL  Neck: Supple, trachea midline  Lungs:  Bilateral crackles, 3L [place]  Heart: Regular rate and rhythm  Abdomen:  Soft, nontender  Extremities:  + peripheral edema.  Neurologic: Nonfocal, moving all four extremities  Skin: No lesions  Access: none    Basic Metabolic Panel: Recent Labs  Lab 08/20/22 0540 08/20/22 1600 08/21/22 0619 08/22/22 0505 08/23/22 0516 08/24/22 0630 08/25/22 0630  NA 135  --  140 141 140 140 140  K 2.7*   < > 3.3* 3.7 3.4* 3.8 3.7  CL 99  --  102 105 103 104 105  CO2 24  --  26 27 27 26 25   GLUCOSE 96  --  115* 139* 115* 120* 133*  BUN 37*  --  43* 55* 64* 66* 73*  CREATININE 1.64*  --  1.90* 2.28* 2.44* 2.43* 2.46*  CALCIUM 7.8*  --  8.0* 8.0* 7.9* 8.0* 8.0*  MG 1.7  --   --   --  2.2 2.5*  --   PHOS  --   --   --   --  3.4 3.5  --    < > = values in this interval not displayed.     Liver Function Tests: Recent Labs  Lab 08/23/22 0516 08/24/22 0630  AST  --  141*  ALT  --  15  ALKPHOS  --  117  BILITOT  --  0.6  PROT  --  6.1*  ALBUMIN 2.1* 2.0*    No results  for input(s): "LIPASE", "AMYLASE" in the last 168 hours. No results for input(s): "AMMONIA" in the last 168 hours.  CBC: Recent Labs  Lab 08/21/22 0619 08/22/22 0505 08/23/22 0516 08/24/22 0920 08/25/22 0630  WBC 12.0*  11.9* 10.4 12.4* 9.6 10.5  NEUTROABS 9.6* 9.3*  --  8.2*  --   HGB 9.1*  9.1* 8.0* 9.0* 9.0* 8.5*  HCT 27.9*  27.9* 25.3* 27.7* 27.6* 26.5*  MCV 93.0  92.7 94.1 93.9 93.6 92.7  PLT 284  267 241 302 279 255     Cardiac Enzymes: Recent Labs  Lab 08/23/22 1950  CKTOTAL 31*     BNP: Invalid input(s): "POCBNP"  CBG: Recent Labs  Lab 08/20/22 0508  GLUCAP 89     Microbiology: Results for orders placed or performed during the hospital encounter of 08/18/22  Blood culture (routine x 2)     Status: None   Collection Time: 08/18/22 10:45 AM   Specimen: BLOOD LEFT ARM  Result Value Ref Range Status   Specimen Description BLOOD LEFT ARM  Final   Special Requests   Final    BOTTLES DRAWN AEROBIC AND ANAEROBIC Blood Culture adequate volume   Culture   Final    NO GROWTH 5 DAYS Performed at [facility], [address] [place], [place] [address]    Report Status 08/23/2022 FINAL  Final  Resp Panel by RT-PCR (Flu A&B, Covid) Anterior Nasal Swab     Status: None   Collection Time: 08/18/22 10:46 AM   Specimen: Anterior Nasal Swab  Result Value Ref Range Status   SARS Coronavirus 2 by RT PCR NEGATIVE NEGATIVE Final    Comment: (NOTE) SARS-CoV-2 target nucleic acids are NOT DETECTED.  The SARS-CoV-2 RNA is generally detectable in upper respiratory specimens during the acute phase of infection. The lowest concentration of SARS-CoV-2 viral copies this assay can detect is 138 copies/mL. A negative result does not preclude SARS-Cov-2 infection and should not be used as the sole basis for treatment or other patient management decisions. A negative result may occur with  improper specimen collection/handling, submission of specimen other than  nasopharyngeal swab, presence of viral mutation(s) within the areas targeted by this assay, and inadequate number of viral copies(<138 copies/mL). A negative result must be combined with clinical observations, patient history, and epidemiological information. The expected result is Negative.  Fact Sheet for Patients:  BloggerCourse.com  Fact Sheet for Healthcare Providers:  SeriousBroker.it  This test is no t yet approved or cleared by the [place] FDA and  has been authorized for detection and/or diagnosis of SARS-CoV-2 by FDA under an Emergency Use Authorization (EUA). This EUA will remain  in effect (meaning this test can be used) for the duration of the COVID-19 declaration under Section 564(b)(1) of the Act, 21 U.S.C.section 360bbb-3(b)(1), unless the authorization is terminated  or revoked sooner.       Influenza A by PCR NEGATIVE NEGATIVE Final   Influenza B by PCR NEGATIVE NEGATIVE Final    Comment: (NOTE) The Xpert Xpress SARS-CoV-2/FLU/RSV plus assay is intended as an aid in the diagnosis of influenza from Nasopharyngeal swab specimens and should not be used as a sole basis for treatment. Nasal washings and aspirates are unacceptable for Xpert Xpress SARS-CoV-2/FLU/RSV testing.  Fact Sheet for Patients: BloggerCourse.com  Fact Sheet for Healthcare Providers: SeriousBroker.it  This test is not yet approved or cleared by the [place] FDA and has been authorized for detection and/or diagnosis of SARS-CoV-2 by FDA under an Emergency Use Authorization (EUA). This EUA will remain in effect (meaning this test can be used) for the duration of the COVID-19 declaration under Section 564(b)(1) of the Act, 21 U.S.C. section 360bbb-3(b)(1), unless the authorization is terminated or revoked.  Performed at [facility], [address] [place], [place]  [address]   Culture, blood (Routine X 2) w Reflex to ID Panel     Status: None   Collection Time: 08/18/22  9:19 PM   Specimen: BLOOD LEFT ARM  Result Value Ref Range Status   Specimen Description BLOOD LEFT ARM  Final   Special Requests   Final    BOTTLES DRAWN AEROBIC AND ANAEROBIC Blood Culture adequate volume   Culture   Final    NO GROWTH 5 DAYS Performed at [facility], [address] [place], [place] [address]    Report Status 08/23/2022 FINAL  Final  Respiratory (~20 pathogens) panel by PCR     Status: None   Collection Time: 08/21/22  1:50 PM   Specimen: Nasopharyngeal Swab;  Respiratory  Result Value Ref Range Status   Adenovirus NOT DETECTED NOT DETECTED Final   Coronavirus 229E NOT DETECTED NOT DETECTED Final    Comment: (NOTE) The Coronavirus on the Respiratory Panel, DOES NOT test for the novel  Coronavirus (2019 nCoV)    Coronavirus HKU1 NOT DETECTED NOT DETECTED Final   Coronavirus NL63 NOT DETECTED NOT DETECTED Final   Coronavirus OC43 NOT DETECTED NOT DETECTED Final   Metapneumovirus NOT DETECTED NOT DETECTED Final   Rhinovirus / Enterovirus NOT DETECTED NOT DETECTED Final   Influenza A NOT DETECTED NOT DETECTED Final   Influenza B NOT DETECTED NOT DETECTED Final   Parainfluenza Virus 1 NOT DETECTED NOT DETECTED Final   Parainfluenza Virus 2 NOT DETECTED NOT DETECTED Final   Parainfluenza Virus 3 NOT DETECTED NOT DETECTED Final   Parainfluenza Virus 4 NOT DETECTED NOT DETECTED Final   Respiratory Syncytial Virus NOT DETECTED NOT DETECTED Final   Bordetella pertussis NOT DETECTED NOT DETECTED Final   Bordetella Parapertussis NOT DETECTED NOT DETECTED Final   Chlamydophila pneumoniae NOT DETECTED NOT DETECTED Final   Mycoplasma pneumoniae NOT DETECTED NOT DETECTED Final    Comment: Performed at [facility], [address], [place], [place] [address]  Culture, blood (Routine X 2) w Reflex to ID Panel     Status: None (Preliminary result)    Collection Time: 08/21/22  8:58 PM   Specimen: BLOOD  Result Value Ref Range Status   Specimen Description BLOOD LEFT WRIST  Final   Special Requests   Final    BOTTLES DRAWN AEROBIC AND ANAEROBIC Blood Culture adequate volume   Culture   Final    NO GROWTH 4 DAYS Performed at [facility], [address] [place], [place] [address]    Report Status PENDING  Incomplete  Culture, blood (Routine X 2) w Reflex to ID Panel     Status: None (Preliminary result)   Collection Time: 08/21/22 11:01 PM   Specimen: BLOOD  Result Value Ref Range Status   Specimen Description BLOOD BLOOD LEFT ARM  Final   Special Requests   Final    BOTTLES DRAWN AEROBIC AND ANAEROBIC Blood Culture adequate volume   Culture   Final    NO GROWTH 4 DAYS Performed at [facility], [address], [place], [place] [address]    Report Status PENDING  Incomplete  Expectorated Sputum Assessment w Gram Stain, Rflx to Resp Cult     Status: None   Collection Time: 08/22/22 11:22 AM   Specimen: Sputum  Result Value Ref Range Status   Specimen Description SPUTUM  Final   Special Requests EXPSU  Final   Sputum evaluation   Final    THIS SPECIMEN IS ACCEPTABLE FOR SPUTUM CULTURE Performed at [facility], [address], [place], [place] [address]    Report Status 08/22/2022 FINAL  Final  Culture, Respiratory w Gram Stain     Status: None (Preliminary result)   Collection Time: 08/22/22 11:22 AM   Specimen: SPU  Result Value Ref Range Status   Specimen Description   Final    SPUTUM Performed at [facility], [address] [place], [place] [address]    Special Requests   Final    EXPSU Reflexed from [id number] Performed at [facility], [address] [place], [place] [address]    Gram Stain   Final    ABUNDANT SQUAMOUS EPITHELIAL CELLS PRESENT FEW WBC PRESENT, PREDOMINANTLY PMN ABUNDANT GRAM VARIABLE  ROD ABUNDANT GRAM POSITIVE COCCI IN PAIRS    Culture   Final     Normal respiratory flora-no Staph aureus or Pseudomonas seen Performed at [facility], [address], [place], [place] [address]    Report Status PENDING  Incomplete    Coagulation Studies: No results for input(s): "LABPROT", "INR" in the last 72 hours.  Urinalysis: Recent Labs    08/23/22 1039  COLORURINE YELLOW*  LABSPEC 1.015  PHURINE 5.0  GLUCOSEU NEGATIVE  HGBUR MODERATE*  BILIRUBINUR NEGATIVE  KETONESUR NEGATIVE  PROTEINUR 100*  NITRITE NEGATIVE  LEUKOCYTESUR NEGATIVE       Imaging: DG Chest Port 1 View  Result Date: 08/25/2022 CLINICAL DATA:  Increasing shortness of breath EXAM: PORTABLE CHEST 1 VIEW COMPARISON:  02/17/2023 FINDINGS: Cardiac shadow is stable. Aortic calcifications are noted. Lungs are well aerated bilaterally. Persistent airspace opacities are noted in the upper lobes bilaterally. Some increasing left basilar opacity is noted. Right PICC is stable. IMPRESSION: New left basilar airspace opacity. Electronically Signed   By: Alcide Clever M.D.   On: 08/25/2022 09:37   US RENAL  Result Date: 08/23/2022 CLINICAL DATA:  Acute kidney insufficiency EXAM: RENAL / URINARY TRACT ULTRASOUND COMPLETE COMPARISON:  Renal ultrasound 08/02/2022. FINDINGS: Right Kidney: Renal measurements: 11.0 x 4.5 x 5.3 cm = volume: 137.8 mL. Echogenicity within normal limits. No mass or hydronephrosis visualized. Left Kidney: Renal measurements: 12.8 x 7.0 x 5.2 cm = volume: 244.7 mL. Echogenicity within normal limits. No mass or hydronephrosis visualized. Bladder: Appears normal for degree of bladder distention. Other: None. IMPRESSION: No collecting system dilatation of either kidney. Electronically Signed   By: Karen Kays M.D.   On: 08/23/2022 16:17   [place] CHEST (PLEURAL EFFUSION)  Result Date: 08/23/2022 CLINICAL DATA:  [id number] Pleural effusion on right [id number] EXAM: CHEST ULTRASOUND COMPARISON:  None Available. FINDINGS: Focused sonographic exam of the right chest was  performed. There is a small volume right pleural effusion with intervening lung parenchyma. No safe window for image guided thoracentesis. No intervention performed. IMPRESSION: Small volume right pleural effusion with intervening lung parenchyma. No safe window for image guided thoracentesis. Electronically Signed   By: Olive Bass M.D.   On: 08/23/2022 16:06     Medications:    albumin human 25 g (08/25/22 1027)   ceFAZolin 2 g (08/25/22 0843)    aspirin EC  81 mg Oral QHS   Chlorhexidine Gluconate Cloth  6 each Topical Daily   cyanocobalamin  1,000 mcg Oral Daily   dexamethasone (DECADRON) injection  8 mg Intravenous Q24H   diltiazem  360 mg Oral Daily   feeding supplement  237 mL Oral BID BM   furosemide  60 mg Intravenous Q12H   metoprolol succinate  100 mg Oral Daily   multivitamins with iron  1 tablet Oral Daily   pantoprazole  40 mg Oral Daily   polyethylene glycol  17 g Oral Daily   rosuvastatin  40 mg Oral Daily   senna  1 tablet Oral Daily   acetaminophen, albuterol, dextromethorphan-guaiFENesin, ondansetron (ZOFRAN) IV, simethicone, sodium chloride, traMADol  Assessment/ Plan:  Mr. DELPHIN FUNES is a 87 y.o.  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  Acute kidney injury with proteinuria and hematuria: Baseline creatinine of 1.15 with normal GFR on 5/29. Serologic work up pending. Serum complements normal. However differential includes AIN, ATN and prerenal azotemia from overdiuresis. No obstruction on  renal ultrasound. No recent IV contrast exposure. Nonoliguric urine output. No acute indication for dialysis.  - holding lisinopril - Discussed role of kidney biopsy in diagnosis with patient and wife.    2.  Acute on chronic diastolic heart failure.  Ejection fraction 55 to 60% with grade 1 diastolic dysfunction noted.  - IV furosemide 60mg  q12 - start albumin 25g IV q12 for 3 days.  - appreciate cardiology input.   3.  Anemia with acute kidney injury: hemoglobin 8.5. PRBC transfusion on 6/18.     LOS: 7 Brett Riley 6/23/202410:48 AM

## 2022-08-26 DIAGNOSIS — J189 Pneumonia, unspecified organism: Secondary | ICD-10-CM | POA: Diagnosis not present

## 2022-08-26 DIAGNOSIS — I5033 Acute on chronic diastolic (congestive) heart failure: Secondary | ICD-10-CM | POA: Diagnosis not present

## 2022-08-26 DIAGNOSIS — J9601 Acute respiratory failure with hypoxia: Secondary | ICD-10-CM | POA: Diagnosis not present

## 2022-08-26 DIAGNOSIS — J90 Pleural effusion, not elsewhere classified: Secondary | ICD-10-CM | POA: Diagnosis not present

## 2022-08-26 DIAGNOSIS — A419 Sepsis, unspecified organism: Secondary | ICD-10-CM | POA: Diagnosis not present

## 2022-08-26 LAB — BASIC METABOLIC PANEL WITH GFR
Anion gap: 12 (ref 5–15)
BUN: 81 mg/dL — ABNORMAL HIGH (ref 8–23)
CO2: 25 mmol/L (ref 22–32)
Calcium: 8.3 mg/dL — ABNORMAL LOW (ref 8.9–10.3)
Chloride: 103 mmol/L (ref 98–111)
Creatinine, Ser: 2.45 mg/dL — ABNORMAL HIGH (ref 0.61–1.24)
GFR, Estimated: 25 mL/min — ABNORMAL LOW
Glucose, Bld: 138 mg/dL — ABNORMAL HIGH (ref 70–99)
Potassium: 3.5 mmol/L (ref 3.5–5.1)
Sodium: 140 mmol/L (ref 135–145)

## 2022-08-26 LAB — ENA+DNA/DS+ANTICH+CENTRO+JO...
Anti JO-1: <0.2 AI (ref 0.0–0.9)
Centromere Ab Screen: <0.2 AI (ref 0.0–0.9)
Chromatin Ab SerPl-aCnc: <0.2 AI (ref 0.0–0.9)
ENA SM Ab Ser-aCnc: <0.2 AI (ref 0.0–0.9)
Ribonucleic Protein: 2.2 AI — ABNORMAL HIGH (ref 0.0–0.9)
SSA (Ro) (ENA) Antibody, IgG: <0.2 AI (ref 0.0–0.9)
SSB (La) (ENA) Antibody, IgG: <0.2 AI (ref 0.0–0.9)
Scleroderma (Scl-70) (ENA) Antibody, IgG: <0.2 AI (ref 0.0–0.9)
ds DNA Ab: <1 [IU]/mL (ref 0–9)

## 2022-08-26 LAB — CULTURE, BLOOD (ROUTINE X 2)
Culture: NO GROWTH
Culture: NO GROWTH
Special Requests: ADEQUATE
Special Requests: ADEQUATE

## 2022-08-26 LAB — ANA W/REFLEX IF POSITIVE: Anti Nuclear Antibody (ANA): POSITIVE — AB

## 2022-08-26 LAB — CBC
HCT: 26.8 % — ABNORMAL LOW (ref 39.0–52.0)
Hemoglobin: 8.6 g/dL — ABNORMAL LOW (ref 13.0–17.0)
MCH: 29.9 pg (ref 26.0–34.0)
MCHC: 32.1 g/dL (ref 30.0–36.0)
MCV: 93.1 fL (ref 80.0–100.0)
Platelets: 251 10*3/uL (ref 150–400)
RBC: 2.88 MIL/uL — ABNORMAL LOW (ref 4.22–5.81)
RDW: 13.8 % (ref 11.5–15.5)
WBC: 9.2 10*3/uL (ref 4.0–10.5)
nRBC: 0 % (ref 0.0–0.2)

## 2022-08-26 LAB — ANCA PROFILE
Anti-MPO Antibodies: <0.2 U (ref 0.0–0.9)
Anti-PR3 Antibodies: 0.2 U (ref 0.0–0.9)
Atypical P-ANCA titer: <1:20 {titer}
C-ANCA: <1:20 {titer}
P-ANCA: <1:20 {titer}

## 2022-08-26 LAB — C-REACTIVE PROTEIN: CRP: 3.3 mg/dL — ABNORMAL HIGH

## 2022-08-26 LAB — LEGIONELLA PNEUMOPHILA SEROGP 1 UR AG: L. pneumophila Serogp 1 Ur Ag: NEGATIVE

## 2022-08-26 LAB — GLOMERULAR BASEMENT MEMBRANE ANTIBODIES: GBM Ab: <0.2 U (ref 0.0–0.9)

## 2022-08-26 NOTE — Progress Notes (Signed)
Rounding Note    Patient Name: Brett Riley Date of Encounter: 08/26/2022  [facility] Cardiologist: Lorine Bears, MD   Subjective   -2.4L UOP. Kidney function stable. Hgb 8.6. Patient feels breathing is getting better. Remains on 3L O2. No chest pain reported.   Inpatient Medications    Scheduled Meds:  apixaban  2.5 mg Oral BID   aspirin EC  81 mg Oral QHS   Chlorhexidine Gluconate Cloth  6 each Topical Daily   cyanocobalamin  1,000 mcg Oral Daily   dexamethasone (DECADRON) injection  4 mg Intravenous Q24H   diltiazem  360 mg Oral Daily   feeding supplement  237 mL Oral BID BM   furosemide  60 mg Intravenous Q12H   metoprolol succinate  100 mg Oral Daily   multivitamins with iron  1 tablet Oral Daily   pantoprazole  40 mg Oral Daily   polyethylene glycol  17 g Oral Daily   rosuvastatin  40 mg Oral Daily   senna  1 tablet Oral Daily   Continuous Infusions:  albumin human 25 g (08/25/22 2151)   ceFAZolin 2 g (08/25/22 2039)   PRN Meds: acetaminophen, albuterol, dextromethorphan-guaiFENesin, melatonin, ondansetron (ZOFRAN) IV, simethicone, sodium chloride, traMADol   Vital Signs    Vitals:   08/25/22 1953 08/25/22 2344 08/26/22 0400 08/26/22 0503  BP: 135/72 121/75 (!) 156/77   Pulse: 64 65 68   Resp: 20 (!) 22 20   Temp: 97.8 F (36.6 C) 97.8 F (36.6 C) 98 F (36.7 C)   TempSrc: Oral Oral Oral   SpO2: 95% 93% 93%   Weight:    86.2 kg  Height:        Intake/Output Summary (Last 24 hours) at 08/26/2022 0751 Last data filed at 08/26/2022 0503 Gross per 24 hour  Intake 680 ml  Output 2400 ml  Net -1720 ml      08/26/2022    5:03 AM 08/23/2022    4:59 AM 08/21/2022    4:37 AM  Last 3 Weights  Weight (lbs) 190 lb 0.6 oz 198 lb 6.6 oz 197 lb 5 oz  Weight (kg) 86.2 kg 90 kg 89.5 kg      Telemetry    BSR GR 60s, PACs - Personally Reviewed  ECG    No new - Personally Reviewed  Physical Exam   GEN: No acute distress.   Neck: No  JVD Cardiac: RRR, no murmurs, rubs, or gallops.  Respiratory: diminished sounds with some crackels GI: +distended  MS: 2+ lower extremity edema; No deformity. Neuro:  Nonfocal  Psych: Normal affect   Labs    High Sensitivity Troponin:   Recent Labs  Lab 08/18/22 1020 08/18/22 1805 08/18/22 2033 08/18/22 2303  TROPONINIHS 258* 1,775* 1,726* 1,446*     Chemistry Recent Labs  Lab 08/20/22 0540 08/20/22 1600 08/23/22 0516 08/24/22 0630 08/25/22 0630 08/26/22 0440  NA 135   < > 140 140 140 140  K 2.7*   < > 3.4* 3.8 3.7 3.5  CL 99   < > 103 104 105 103  CO2 24   < > 27 26 25 25   GLUCOSE 96   < > 115* 120* 133* 138*  BUN 37*   < > 64* 66* 73* 81*  CREATININE 1.64*   < > 2.44* 2.43* 2.46* 2.45*  CALCIUM 7.8*   < > 7.9* 8.0* 8.0* 8.3*  MG 1.7  --  2.2 2.5*  --   --  PROT  --   --   --  6.1*  --   --   ALBUMIN  --   --  2.1* 2.0*  --   --   AST  --   --   --  141*  --   --   ALT  --   --   --  15  --   --   ALKPHOS  --   --   --  117  --   --   BILITOT  --   --   --  0.6  --   --   GFRNONAA 41*   < > 25* 26* 25* 25*  ANIONGAP 12   < > 10 10 10 12    < > = values in this interval not displayed.    Lipids No results for input(s): "CHOL", "TRIG", "HDL", "LABVLDL", "LDLCALC", "CHOLHDL" in the last 168 hours.  Hematology Recent Labs  Lab 08/24/22 0920 08/25/22 0630 08/26/22 0440  WBC 9.6 10.5 9.2  RBC 2.95* 2.86* 2.88*  HGB 9.0* 8.5* 8.6*  HCT 27.6* 26.5* 26.8*  MCV 93.6 92.7 93.1  MCH 30.5 29.7 29.9  MCHC 32.6 32.1 32.1  RDW 14.1 13.8 13.8  PLT 279 255 251   Thyroid No results for input(s): "TSH", "FREET4" in the last 168 hours.  BNP Recent Labs  Lab 08/21/22 1655  BNP 673.3*    DDimer No results for input(s): "DDIMER" in the last 168 hours.   Radiology    DG Chest Port 1 View  Result Date: 08/25/2022 CLINICAL DATA:  Increasing shortness of breath EXAM: PORTABLE CHEST 1 VIEW COMPARISON:  02/17/2023 FINDINGS: Cardiac shadow is stable. Aortic  calcifications are noted. Lungs are well aerated bilaterally. Persistent airspace opacities are noted in the upper lobes bilaterally. Some increasing left basilar opacity is noted. Right PICC is stable. IMPRESSION: New left basilar airspace opacity. Electronically Signed   By: Alcide Clever M.D.   On: 08/25/2022 09:37    Cardiac Studies   Limited echo 08/19/2022: 1. Left ventricular ejection fraction, by estimation, is 50 to 55%. The  left ventricle has low normal function. Left ventricular endocardial  border not optimally defined to evaluate regional wall motion.   2. Right ventricular systolic function is normal.  __________   TEE 08/05/2022: 1. Left ventricular ejection fraction, by estimation, is 55 to 60%. The  left ventricle has normal function.   2. Right ventricular systolic function is normal. The right ventricular  size is normal.   3. No left atrial/left atrial appendage thrombus was detected.   4. The mitral valve is normal in structure. Mild mitral valve  regurgitation.   5. The aortic valve is tricuspid. Aortic valve regurgitation is trivial.   Patient Profile     87 y.o. 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   Assessment & Plan    Acute respiratory failure - initially treated with IV lasix>held for worsening renal function - CT scan showed interstitial opacities concerning for atypical infection - b/l pleural effusions - thoracentesis attempted 6/21 but there was only trivial fluid on the left and very small fluid pocket on the right - abx per IM  Acute on Chronic HFpEF - Limited echo showed LVEF 50-55% with normal  RVSF - has received intermittent lasix dose -  Scr 2.43>2.46>2.45 - hypoalbuminemia is contributing to swelling - IV albumin per nephrology - IV lasix 60mg  q12 H - no SGLT2i with AKI - diuretics per nephrology  NSTEMI vs demand ischemia - suspected supply/demand mismatch in the setting of pneumonitis/hypoxia, fever, infection and anemia - HS trop peak 1775 - no anginal symptoms reported - EKG nonischemic - IV heparin stopped for rectal bleeding - no plans for ischemic work-up given rectal bleeding - continue ASA, BB and statin therapy  Symptomatic Anemia - Hgb 8.5 today - IV heparin stopped due to rectal bleeding - s/p PRBC transfusion 6/18 - Eliquis 2.5mg  BID  HTN - BP mildly elevated - Cardizem 360mg  daily and Toprol 100mg  daily  HLD - LDL 34 - Crestor 40mg  daily  Paroxysmal Afib - Eliquis 2.5mg BID - remains in NSR  AKI on CKD stage 3 - Scr 2.45 - Baseline Scr 1.15 - lisinopril held - may need kidney biopsy  - nephrology following  MSSA bacteriemia - per IM  For questions or updates, please contact [facility] Please consult [email or web address] for contact info under        Signed, Nakenya Theall David Stall, PA-C  08/26/2022, 7:51 AM

## 2022-08-26 NOTE — Progress Notes (Signed)
PROGRESS NOTE  Brett Riley HQI:[id number] DOB: Jul 30, 1935 DOA: 08/18/2022 PCP: Dale , MD  Hospital course: Brett Riley is a 87 y.o. male with medical history significant of chronic dCHF, HTN, HLD, CAD, CKD-3a, anemia, chronic A fib on Eliquis, who presents 08/18/2022 to ED from rehab, chief complaint SOB.  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 Net IO Since Admission: -5,549.14 mL [08/20/22 1608]. Heparin stopped d/t rectal bleeding overnight.  06/19: euvolemic per cardio, d/c lasix, O2 requirement still high, CT chest reviewed, ?edema/atypical infection, given clinically worsening respiratory status have asked pulmonary and ID to consult. Have d/c amiodarone and ordered steroids in case amio toxicity.  06/20: ID and pulmonary assisting with the management.  6/22-6/24: patient feels well today. Patient requiring judicial adjustments to balance between hypervolemia and renal injury with diuresis. Unable to pull fluid from lungs with thoracentesis so medical management is best option. Respiratory status has improved. Weaned from 6L to 2L currently.   Assessment/Plan: Principal Problem:   Acute respiratory failure with hypoxia (HCC) Active Problems:   Acute on chronic heart failure with preserved ejection fraction (HFpEF) (HCC)   MSSA bacteremia   Vertebral osteomyelitis (HCC)   CAD (coronary artery disease)   Myocardial injury   Hypertension   Hyperlipidemia   Chronic kidney disease, stage 3a (HCC)   Normocytic anemia   Atrial fibrillation, chronic (HCC)    Paroxysmal atrial fibrillation (HCC)   HCAP (healthcare-associated pneumonia)   Sepsis (HCC)   Pleural effusion   Amiodarone pulmonary toxicity   Acute on chronic HFrEF (heart failure with reduced ejection fraction) (HCC)   Pleural effusion on right   Status post thoracentesis   Heart failure with preserved ejection fraction (HCC)  Acute on chronic respiratory failure with hypoxia due to acute on chronic CHF  possible pneumonia, bilateral small pleural effusion- as seen on chest CT Not on oxygen supplementation at baseline Currently on 2L with O2 saturation in upper 90s. - wean as tolerated - f/u sputum culture results- NGTD -Completed course of azithromycin. - Continue Ancef as written for his osteomyelitis  - incentive spirometer q1hr  Acute on chronic diastolic CHF- improving. Down about 2kg since 6/16. Echo 6/17: LVEF of 50-55% with RV systolic function is normal . BNP 673 on 08/21/22 - judicial use of diuretics and close monitoring of kidney function. Patient is hypervolemic on exam and improved from yesterday - add on albumin per nephro - TED stocking - Continue strict I's and O's and daily weight.   Pleural effusion- IR unable to perform thoracentesis 6/20, 6/21. Medical management. Clinically improving respiratory status.  - periodic chest xray to monitor if not improving/worsening   Atrial fibrillation, chronic (HCC): Heart rate now well controlled in 70s diltiazem, lisinopril, metoprolol per cardiology Restarting eliquis tomorrow in setting of no new bleeding and patient's hgb has been stable >5 days  Recent hx of MSSA bacteremia vertebral osteomyelitis (HCC): continue IV Ancef  repeat Blood culture NGTD   CAD  Myocardial injury  HLD:  trop  258, possibly due to demand ischemia secondary to CHF exacerbation.  Patient does not  have chest pain. Crestor, beta blocker, Aspirin 81 discontinued as he's on eliquis  Cardiology following  May need coronary angiography at  some point but will defer for now    Chronic kidney disease, stage 3a [facility]): Cr stable 2.4>2.4 even with use of diuretics. Baseline creatinine 1.5-1.9 Follow BMP  Normocytic anemia: hgb stable around 9. S/p 1 unit PRBC 06/18 ABLA w/ rectal bleed   Hypertension Continue to closely monitor vital signs.   Resolved hypokalemia   GERD PPI    DVT prophylaxis: SCDs Pertinent IV fluids/nutrition: None Central lines / invasive devices: PICC in R Brachial     Code Status: DNR, see H&P, discuss w/ pt/family if respiratory deterioration requiring intubation, no CPR ACP documentation reviewed: 08/19/22 - HCPOA w/ 1) Dava Najjar, alternates 2) Erling Conte, 3) Sandra Cockayne FIelds.    Current Admission Status: inpatient  TOC needs / Dispo plan: TBD Barriers to discharge / significant pending items: cardiology recs  Status is: Inpatient The patient requires at least 2 midnights for further evaluation and treatment of present condition.  Objective: Vitals:   08/25/22 2344 08/26/22 0400 08/26/22 0503 08/26/22 0827  BP: 121/75 (!) 156/77  (!) 153/78  Pulse: 65 68  64  Resp: (!) 22 20  20   Temp: 97.8 F (36.6 C) 98 F (36.7 C)  97.9 F (36.6 C)  TempSrc: Oral Oral  Oral  SpO2: 93% 93%    Weight:   86.2 kg   Height:        Intake/Output Summary (Last 24 hours) at 08/26/2022 0829 Last data filed at 08/26/2022 4098 Gross per 24 hour  Intake 680 ml  Output 3050 ml  Net -2370 ml    Filed Weights   08/21/22 0437 08/23/22 0459 08/26/22 0503  Weight: 89.5 kg 90 kg 86.2 kg    Exam:  General: 87 y.o. year-old male frail-appearing in no acute distress.  He is alert.  Hard of hearing. Cardiovascular: Irregular rate and rhythm no rubs or gallops. Respiratory: Mild diffuse rales bilaterally.  Poor inspiratory effort. Musculoskeletal: 1+ pitting edema in lower extremities bilaterally. Skin: No ulcerative lesions noted or rashes, Psychiatry: Mood is appropriate for condition and  setting  Data Reviewed: CBC: Recent Labs  Lab 08/21/22 0619 08/22/22 0505 08/23/22 0516 08/24/22 0920 08/25/22 0630 08/26/22 0440  WBC 12.0*  11.9* 10.4 12.4* 9.6 10.5 9.2  NEUTROABS 9.6* 9.3*  --  8.2*  --   --   HGB 9.1*  9.1* 8.0* 9.0* 9.0* 8.5* 8.6*  HCT 27.9*  27.9* 25.3* 27.7* 27.6* 26.5* 26.8*  MCV 93.0  92.7 94.1 93.9 93.6 92.7 93.1  PLT 284  267 241 302 279 255 251    Basic Metabolic Panel: Recent Labs  Lab 08/20/22 0540 08/20/22 1600 08/22/22 0505 08/23/22 0516 08/24/22 0630 08/25/22 0630 08/26/22 0440  NA 135   < > 141 140 140 140 140  K 2.7*   < > 3.7 3.4* 3.8 3.7 3.5  CL 99   < > 105 103 104 105 103  CO2 24   < > 27 27 26 25 25   GLUCOSE 96   < > 139* 115* 120* 133* 138*  BUN 37*   < > 55* 64* 66* 73* 81*  CREATININE 1.64*   < > 2.28* 2.44* 2.43* 2.46* 2.45*  CALCIUM 7.8*   < > 8.0* 7.9* 8.0* 8.0* 8.3*  MG 1.7  --   --  2.2 2.5*  --   --   PHOS  --   --   --  3.4 3.5  --   --    < > = values in this interval not displayed.    GFR: Estimated Creatinine Clearance: 24.6 mL/min (A) (by C-G formula based on SCr of 2.45 mg/dL (H)). Liver Function Tests: Recent Labs  Lab 08/23/22 0516 08/24/22 0630  AST  --  141*  ALT  --  15  ALKPHOS  --  117  BILITOT  --  0.6  PROT  --  6.1*  ALBUMIN 2.1* 2.0*    Coagulation Profile: No results for input(s): "INR", "PROTIME" in the last 168 hours.  Cardiac Enzymes: Recent Labs  Lab 08/23/22 1950  CKTOTAL 31*    Studies: DG Chest Port 1 View  Result Date: 08/25/2022 CLINICAL DATA:  Increasing shortness of breath EXAM: PORTABLE CHEST 1 VIEW COMPARISON:  02/17/2023 FINDINGS: Cardiac shadow is stable. Aortic calcifications are noted. Lungs are well aerated bilaterally. Persistent airspace opacities are noted in the upper lobes bilaterally. Some increasing left basilar opacity is noted. Right PICC is stable. IMPRESSION: New left basilar airspace opacity. Electronically Signed   By: Alcide Clever M.D.   On:  08/25/2022 09:37    Scheduled Meds:  apixaban  2.5 mg Oral BID   aspirin EC  81 mg Oral QHS   Chlorhexidine Gluconate Cloth  6 each Topical Daily   cyanocobalamin  1,000 mcg Oral Daily   dexamethasone (DECADRON) injection  4 mg Intravenous Q24H   diltiazem  360 mg Oral Daily   feeding supplement  237 mL Oral BID BM   furosemide  60 mg Intravenous Q12H   metoprolol succinate  100 mg Oral Daily   multivitamins with iron  1 tablet Oral Daily   pantoprazole  40 mg Oral Daily   polyethylene glycol  17 g Oral Daily   rosuvastatin  40 mg Oral Daily   senna  1 tablet Oral Daily    Continuous Infusions:  albumin human 25 g (08/25/22 2151)   ceFAZolin 2 g (08/25/22 2039)     LOS: 8 days    Leeroy Bock, MD [facility] Pager [phone]  If 7PM-7AM, please contact night-coverage [email or web address] Password [facility] 08/26/2022, 8:29 AM

## 2022-08-26 NOTE — Progress Notes (Signed)
Date of Admission:  08/18/2022      ID: Brett Riley is a 87 y.o. male Principal Problem:   Acute respiratory failure with hypoxia (HCC) Active Problems:   Hyperlipidemia   CAD (coronary artery disease)   Hypertension   MSSA bacteremia   Vertebral osteomyelitis (HCC)   Myocardial injury   Acute on chronic heart failure with preserved ejection fraction (HFpEF) (HCC)   Chronic kidney disease, stage 3a (HCC)   Normocytic anemia   Atrial fibrillation, chronic (HCC)   Paroxysmal atrial fibrillation (HCC)   HCAP (healthcare-associated pneumonia)   Sepsis (HCC)   Pleural effusion   Amiodarone pulmonary toxicity   Acute on chronic HFrEF (heart failure with reduced ejection fraction) (HCC)   Pleural effusion on right   Status post thoracentesis   Heart failure with preserved ejection fraction (HCC)  Family at bed side   Subjective: Pt is doing better Breathing is so much better  Medications:   apixaban  2.5 mg Oral BID   aspirin EC  81 mg Oral QHS   Chlorhexidine Gluconate Cloth  6 each Topical Daily   cyanocobalamin  1,000 mcg Oral Daily   dexamethasone (DECADRON) injection  4 mg Intravenous Q24H   diltiazem  360 mg Oral Daily   feeding supplement  237 mL Oral BID BM   furosemide  60 mg Intravenous Q12H   metoprolol succinate  100 mg Oral Daily   multivitamins with iron  1 tablet Oral Daily   pantoprazole  40 mg Oral Daily   polyethylene glycol  17 g Oral Daily   rosuvastatin  40 mg Oral Daily   senna  1 tablet Oral Daily    Objective: Vital signs in last 24 hours: Patient Vitals for the past 24 hrs:  BP Temp Temp src Pulse Resp SpO2 Weight  08/26/22 1616 127/67 97.7 F (36.5 C) Oral (!) 59 15 94 % --  08/26/22 1224 127/64 97.8 F (36.6 C) Oral 64 16 91 % --  08/26/22 0827 (!) 153/78 97.9 F (36.6 C) Oral 64 20 -- --  08/26/22 0503 -- -- -- -- -- -- 86.2 kg  08/26/22 0400 (!) 156/77 98 F (36.7 C) Oral 68 20 93 % --  08/25/22 2344 121/75 97.8 F (36.6 C)  Oral 65 (!) 22 93 % --     PHYSICAL EXAM:  General: More alert, responds to questions appropriately, 2 L nasal cannula  oxygen Lungs: b/la ir entry crepts Heart: irregular- well controlled Abdomen: Soft, non-tender,not distended. Bowel sounds normal. No masses Extremities: atraumatic, no cyanosis. No edema. No clubbing Skin: No rashes or lesions. Or bruising Lymph: Cervical, supraclavicular normal. Neurologic: Grossly non-focal  Lab Results    Latest Ref Rng & Units 08/26/2022    4:40 AM 08/25/2022    6:30 AM 08/24/2022    9:20 AM  CBC  WBC 4.0 - 10.5 K/uL 9.2  10.5  9.6   Hemoglobin 13.0 - 17.0 g/dL 8.6  8.5  9.0   Hematocrit 39.0 - 52.0 % 26.8  26.5  27.6   Platelets 150 - 400 K/uL 251  255  279        Latest Ref Rng & Units 08/26/2022    4:40 AM 08/25/2022    6:30 AM 08/24/2022    6:30 AM  CMP  Glucose 70 - 99 mg/dL 086  578  469   BUN 8 - 23 mg/dL 81  73  66   Creatinine 0.61 - 1.24 mg/dL 6.29  2.46  2.43   Sodium 135 - 145 mmol/L 140  140  140   Potassium 3.5 - 5.1 mmol/L 3.5  3.7  3.8   Chloride 98 - 111 mmol/L 103  105  104   CO2 22 - 32 mmol/L 25  25  26    Calcium 8.9 - 10.3 mg/dL 8.3  8.0  8.0   Total Protein 6.5 - 8.1 g/dL   6.1   Total Bilirubin 0.3 - 1.2 mg/dL   0.6   Alkaline Phos 38 - 126 U/L   117   AST 15 - 41 U/L   141   ALT 0 - 44 U/L   15       Microbiology: [facility] NG  Studies/Results: DG Chest Port 1 View  Result Date: 08/25/2022 CLINICAL DATA:  Increasing shortness of breath EXAM: PORTABLE CHEST 1 VIEW COMPARISON:  02/17/2023 FINDINGS: Cardiac shadow is stable. Aortic calcifications are noted. Lungs are well aerated bilaterally. Persistent airspace opacities are noted in the upper lobes bilaterally. Some increasing left basilar opacity is noted. Right PICC is stable. IMPRESSION: New left basilar airspace opacity. Electronically Signed   By: Alcide Clever M.D.   On: 08/25/2022 09:37     Assessment/Plan: Acute hypoxic respiratory failure with b/l  infiltrates D.D CHF ? 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

## 2022-08-26 NOTE — Evaluation (Signed)
Occupational Therapy Evaluation Patient Details Name: Brett Riley MRN: [id number] DOB: 02-04-1936 Today's Date: 08/26/2022   History of Present Illness Brett Riley is a 87 y/o M admitted with respiratory failure; other PMH includes MI, CHF, a-fib, HTN, anemia. Was at SNF doing rehab prior to admission; was previously admitted 5/18-6/7.   Clinical Impression   Patient received for OT evaluation. See flowsheet below for details of function. Generally, patient requiring MOD A for bed mobility, MAX A x2 to attempt standing 3x (unable to achieve full stand) with RW, and MOD-MAX A for ADLs. Patient will benefit from continued OT while in acute care.      Recommendations for follow up therapy are one component of a multi-disciplinary discharge planning process, led by the attending physician.  Recommendations may be updated based on patient status, additional functional criteria and insurance authorization.   Assistance Recommended at Discharge Frequent or constant Supervision/Assistance  Patient can return home with the following Two people to help with walking and/or transfers;A lot of help with bathing/dressing/bathroom;Assistance with cooking/housework;Direct supervision/assist for medications management;Direct supervision/assist for financial management;Assist for transportation;Help with stairs or ramp for entrance    Functional Status Assessment  Patient has had a recent decline in their functional status and demonstrates the ability to make significant improvements in function in a reasonable and predictable amount of time.  Equipment Recommendations  Other (comment) (defer to next venue of care)    Recommendations for Other Services       Precautions / Restrictions Precautions Precautions: Fall Restrictions Weight Bearing Restrictions: No      Mobility Bed Mobility Overal bed mobility: Needs Assistance Bed Mobility: Rolling, Supine to Sit, Sit to Supine Rolling: Min  assist (pt using rails and assisting in rolling)   Supine to sit: HOB elevated, Mod assist Sit to supine: Max assist, +2 for physical assistance        Transfers Overall transfer level: Needs assistance Equipment used: Rolling walker (2 wheels) Transfers: Sit to/from Stand Sit to Stand: Mod assist, +2 physical assistance, From elevated surface           General transfer comment: Pt able to stand 3x with +2 assist at [facility] with bed elevated; however, pt with difficulty achieving full standing position and only able to stand x approx 5 seconds.      Balance Overall balance assessment: Needs assistance Sitting-balance support: Feet supported, Bilateral upper extremity supported Sitting balance-Leahy Scale: Poor Sitting balance - Comments: Fatigues quickly, requires MIN-MOD A sitting balance throughout sitting; pt is aware that his balance is poor Postural control: Posterior lean Standing balance support: Bilateral upper extremity supported, Reliant on assistive device for balance Standing balance-Leahy Scale: Zero Standing balance comment: did not achieve full standing position; attempted stand 3x.                           ADL either performed or assessed with clinical judgement   ADL Overall ADL's : Needs assistance/impaired                     Lower Body Dressing: Total assistance;Bed level Lower Body Dressing Details (indicate cue type and reason): donning socks; pt stating he cannot lean forward far enough to don socks. Anticipate overall MAX A-total for LB dressing currently.               General ADL Comments: Didn't formally assess ADLs today due to pt with significantly decreased balance at  EOB; pt requiring BIL hands on EOB and OT assistance MIN-MOD A to maintain sitting balance, so unable to engage in ADLs at this time. UE shoulder ROM WFL, although generally deconditioned, while at bed level; BIL hand grip functional.  Unable to walk today  despite attempt at standing. Anticipate significant assist with all out of bed ADLs. Bed level toilet hygiene dependent today with pt rolling with MIN A (bowel hygiene).     Vision Baseline Vision/History: 1 Wears glasses       Perception     Praxis      Pertinent Vitals/Pain Pain Assessment Pain Assessment: 0-10 Pain Score:  (unrated) Pain Location: R knee Pain Descriptors / Indicators: Guarding, Discomfort, Grimacing, Aching     Hand Dominance Right   Extremity/Trunk Assessment Upper Extremity Assessment Upper Extremity Assessment: Generalized weakness;Overall [facility] for tasks assessed   Lower Extremity Assessment Lower Extremity Assessment: Generalized weakness;Defer to PT evaluation       Communication Communication Communication: HOH   Cognition Arousal/Alertness: Awake/alert Behavior During Therapy: WFL for tasks assessed/performed Overall Cognitive Status: Within Functional Limits for tasks assessed                                 General Comments: Not formally assessed, but answered all questions appropriately.     General Comments  Pt on 3L O2 throughout session. O2 sat 91-93% at rest; 87-90% with activity. RN in room throughout second half of session, including for standing attempts and t/f sit to supine.    Exercises     Shoulder Instructions      Home Living Family/patient expects to be discharged to:: Private residence (pt admitted from SNF rehab, but typically lives at home.) Living Arrangements: Spouse/significant other Available Help at Discharge: Family;Available 24 hours/day Type of Home: House Home Access: Stairs to enter [employer] of Steps: 2 Entrance Stairs-Rails: Right Home Layout: Two level;Able to live on main level with bedroom/bathroom     Bathroom Shower/Tub: Chief Strategy Officer: Handicapped height Bathroom Accessibility: Yes How Accessible: Accessible via walker Home Equipment: None           Prior Functioning/Environment Prior Level of Function : Independent/Modified Independent;Working/employed;Driving             Mobility Comments: Pt able to be active, driving, working, mowing yard, etc ADLs Comments: Pt is very independent. 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        OT Problem List: Decreased strength;Decreased activity tolerance;Decreased safety awareness;Impaired balance (sitting and/or standing);Decreased knowledge of use of DME or AE;Pain      OT Treatment/Interventions: Self-care/ADL training;Therapeutic exercise;Therapeutic activities;Energy conservation;DME and/or AE instruction;Patient/family education;Balance training    OT Goals(Current goals can be found in the care plan section) Acute Rehab OT Goals Patient Stated Goal: Get better OT Goal Formulation: With patient Time For Goal Achievement: 09/09/22 Potential to Achieve Goals: Good ADL Goals Pt Will Perform Grooming: with min assist;standing Pt Will Perform Lower Body Dressing: with min assist;with adaptive equipment;sit to/from stand Pt Will Transfer to Toilet: with min assist;bedside commode;stand pivot transfer Pt Will Perform Toileting - Clothing Manipulation and hygiene: with min assist;sit to/from stand  OT Frequency: Min 1X/week    Co-evaluation              AM-PAC OT "6 Clicks" Daily Activity     Outcome Measure Help from another person  eating meals?: None Help from another person taking care of personal grooming?: A Little Help from another person toileting, which includes using toliet, bedpan, or urinal?: Total Help from another person bathing (including washing, rinsing, drying)?: A Lot Help from another person to put on and taking off regular upper body clothing?: A Little Help from another person to put on and taking off regular lower body clothing?: Total 6 Click Score: 14   End of Session Equipment Utilized During Treatment:  Rolling walker (2 wheels);Gait belt Nurse Communication: Mobility status  Activity Tolerance: Patient limited by fatigue Patient left: in bed;with call bell/phone within reach;with bed alarm set;with nursing/sitter in room  OT Visit Diagnosis: Unsteadiness on feet (R26.81);Muscle weakness (generalized) (M62.81)                Time: 1610-9604 OT Time Calculation (min): 39 min Charges:  OT General Charges $OT Visit: 1 Visit OT Evaluation $OT Eval Moderate Complexity: 1 Mod OT Treatments $Self Care/Home Management : 8-22 mins $Therapeutic Activity: 8-22 mins  Linward Foster, MS, OTR/L  Alvester Morin 08/26/2022, 4:52 PM

## 2022-08-26 NOTE — Progress Notes (Signed)
PT Cancellation Note  Patient Details Name: Brett Riley MRN: [id number] DOB: 04-Apr-1935   Cancelled Treatment:    Reason Eval/Treat Not Completed: Fatigue/lethargy limiting ability to participate. Attempted to see patient 30 minutes after OT. Patient stated they had already done their session today and they were too tired to try again right now. Suggested we could maybe comeback later. Will re attempt tomorrow morning and coordinate with OT to spread out sessions.   Malachi Carl, SPT   Malachi Carl 08/26/2022, 4:06 PM

## 2022-08-26 NOTE — Care Management Important Message (Signed)
Important Message  Patient Details  Name: Brett Riley MRN: [id number] Date of Birth: 1936-01-01   Medicare Important Message Given:  Yes     [name] 08/26/2022, 12:01 PM

## 2022-08-26 NOTE — TOC Initial Note (Signed)
Transition of Care [facility]) - Initial/Assessment Note    Patient Details  Name: Brett Riley MRN: [id number] Date of Birth: 08/09/1935  Transition of Care [facility]) CM/SW Contact:    Margarito Liner, LCSW Phone Number: 08/26/2022, 1:34 PM  Clinical Narrative:  CSW met with patient. Wife at bedside. CSW introduced role and explained that discharge planning would be discussed. Patient has been at [facility] 6/7-6/16 (9 days). PT and OT evals pending. Patient's wife would like to look at other options due to a few concerns she has had at current facility. Discussed copays after 11 more days at rehab. Wife asked if we could consider [facility] but explained criteria for that level of care. No further concerns. CSW encouraged patient and his wife to contact CSW as needed. CSW will continue to follow patient for support and facilitate discharge to SNF once medically stable.                Expected Discharge Plan: Skilled Nursing Facility Barriers to Discharge: Continued Medical Work up, English as a second language teacher   Patient Goals and CMS Choice            Expected Discharge Plan and Services     Post Acute Care Choice: Skilled Nursing Facility Living arrangements for the past 2 months: Single Family Home                                      Prior Living Arrangements/Services Living arrangements for the past 2 months: Single Family Home Lives with:: Spouse Patient language and need for interpreter reviewed:: Yes Do you feel safe going back to the place where you live?: Yes      Need for Family Participation in Patient Care: Yes (Comment) Care giver support system in place?: Yes (comment)   Criminal Activity/Legal Involvement Pertinent to Current Situation/Hospitalization: No - Comment as needed  Activities of Daily Living Home Assistive Devices/Equipment: None ADL Screening (condition at time of admission) Patient's cognitive ability adequate to safely  complete daily activities?: Yes Is the patient deaf or have difficulty hearing?: Yes Does the patient have difficulty seeing, even when wearing glasses/contacts?: No Does the patient have difficulty concentrating, remembering, or making decisions?: No Patient able to express need for assistance with ADLs?: Yes Does the patient have difficulty dressing or bathing?: No Independently performs ADLs?: No Communication: Independent Is this a change from baseline?: Pre-admission baseline Dressing (OT): Needs assistance Is this a change from baseline?: Pre-admission baseline Grooming: Needs assistance Is this a change from baseline?: Change from baseline, expected to last <3 days Feeding: Needs assistance Is this a change from baseline?: Pre-admission baseline Bathing: Needs assistance Is this a change from baseline?: Pre-admission baseline Toileting: Needs assistance Is this a change from baseline?: Pre-admission baseline In/Out Bed: Needs assistance Is this a change from baseline?: Pre-admission baseline Walks in Home: Needs assistance Is this a change from baseline?: Pre-admission baseline Does the patient have difficulty walking or climbing stairs?: Yes Weakness of Legs: Both Weakness of Arms/Hands: Both  Permission Sought/Granted Permission sought to share information with : Facility Medical sales representative, Family Supports Permission granted to share information with : Yes, Verbal Permission Granted  Share Information with NAME: [name]  Permission granted to share info w AGENCY: SNF's  Permission granted to share info w Relationship: Wife  Permission granted to share info w Contact Information: [phone]  Emotional Assessment Appearance:: Appears  stated age Attitude/Demeanor/Rapport: Engaged, Gracious Affect (typically observed): Appropriate, Calm, Pleasant Orientation: : Oriented to Self, Oriented to Place, Oriented to  Time, Oriented to Situation Alcohol /  Substance Use: Not Applicable Psych Involvement: No (comment)  Admission diagnosis:  HCAP (healthcare-associated pneumonia) [J18.9] Acute on chronic respiratory failure with hypoxia (HCC) [J96.21] Sepsis, due to unspecified organism, unspecified whether acute organ dysfunction present [facility]) [A41.9] Patient Active Problem List   Diagnosis Date Noted   Status post thoracentesis 08/25/2022   Heart failure with preserved ejection fraction (HCC) 08/25/2022   Acute on chronic HFrEF (heart failure with reduced ejection fraction) (HCC) 08/24/2022   Pleural effusion on right 08/24/2022   Pleural effusion 08/23/2022   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   HCAP (healthcare-associated pneumonia) 08/21/2022   Sepsis (HCC) 08/21/2022   Paroxysmal atrial fibrillation (HCC) 08/20/2022   Acute respiratory failure with hypoxia (HCC) 08/18/2022   Myocardial injury 08/18/2022   Acute on chronic heart failure with preserved ejection fraction (HFpEF) (HCC) 08/18/2022   Chronic kidney disease, stage 3a (HCC) 08/18/2022   Normocytic anemia 08/18/2022   Atrial fibrillation, chronic (HCC) 08/18/2022   MSSA bacteremia 08/05/2022   Vertebral osteomyelitis (HCC) 08/05/2022   Acute osteomyelitis of lumbar spine (HCC) 08/03/2022   Metabolic acidosis 08/03/2022   Discitis of lumbar region 08/03/2022   Epidural abscess 08/03/2022   MSSA (methicillin susceptible Staphylococcus aureus) septicemia (HCC) 08/02/2022   AKI (acute kidney injury) (HCC) 08/01/2022   Effusion of right knee 08/01/2022   Acute metabolic encephalopathy 08/01/2022   Hyponatremia 07/31/2022   Overweight (BMI 25.0-29.9) 07/31/2022   Persistent atrial fibrillation (HCC) 07/30/2022   Atrial fibrillation with rapid ventricular response (HCC) 07/23/2022   PVD (peripheral vascular disease) (HCC) 07/21/2022   Penetrating atherosclerotic ulcer of aorta (HCC) 07/20/2022   Uncontrolled hypertension 07/20/2022   Dyslipidemia 07/20/2022    Coronary artery disease 07/20/2022   Hypoxemia 07/20/2022   Scalp lesion 10/14/2021   Abnormal CT of the abdomen 04/09/2021   Open wound of skin 10/16/2020   Atherosclerotic ulcer of aorta (HCC) 08/27/2020   Leg pain 01/10/2020   Fever 10/03/2019   Hypertension 06/01/2019   Aneurysm artery, 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 [place] [address] Phone: [phone] Fax: [phone]  [id number] [place] - [address] [place] [address] Phone: [phone] Fax: [phone]     Social Determinants of Health (SDOH) Social History: SDOH Screenings   Food Insecurity: No Food Insecurity (08/18/2022)  Housing: Low Risk  (08/18/2022)  Transportation Needs: No Transportation Needs (08/18/2022)  Utilities: Not At Risk (08/18/2022)  Depression (PHQ2-9): Low Risk  (06/06/2022)  Financial Resource Strain: Low Risk  (08/29/2020)  Physical Activity: Insufficiently Active (08/29/2020)  Social Connections: Unknown (08/29/2020)  Stress: No Stress Concern Present (08/29/2020)  Tobacco Use: Low Risk  (08/18/2022)   SDOH Interventions:     Readmission Risk Interventions  No data to display

## 2022-08-26 NOTE — Progress Notes (Signed)
[facility]  ROUNDING NOTE   Subjective:   Patient sitting up in bed Wife at bedside. Weaned to 2L Brett Riley  UOP 2.4L   Creatinine stable   Objective:  Vital signs in last 24 hours:  Temp:  [97.8 F (36.6 C)-98 F (36.7 C)] 97.8 F (36.6 C) (06/24 1224) Pulse Rate:  [64-68] 64 (06/24 1224) Resp:  [16-22] 16 (06/24 1224) BP: (121-156)/(64-78) 127/64 (06/24 1224) SpO2:  [91 %-97 %] 91 % (06/24 1224) FiO2 (%):  [28 %] 28 % (06/23 2344) Weight:  [86.2 kg] 86.2 kg (06/24 0503)  Weight change:  Filed Weights   08/21/22 0437 08/23/22 0459 08/26/22 0503  Weight: 89.5 kg 90 kg 86.2 kg    Intake/Output: I/O last 3 completed shifts: In: 920 [P.O.:720; IV Piggyback:200] Out: 4300 [Urine:4300]   Intake/Output this shift:  Total I/O In: -  Out: 650 [Urine:650]  Physical Exam: General: NAD, laying in bed  Head: Normocephalic, atraumatic. Moist oral mucosal membranes  Eyes: Anicteric  Lungs:  Bilateral crackles, 2L [place]  Heart: Regular rate and rhythm  Abdomen:  Soft, nontender  Extremities:  + peripheral edema.  Neurologic: Nonfocal, moving all four extremities  Skin: No lesions  Access: none    Basic Metabolic Panel: Recent Labs  Lab 08/20/22 0540 08/20/22 1600 08/22/22 0505 08/23/22 0516 08/24/22 0630 08/25/22 0630 08/26/22 0440  NA 135   < > 141 140 140 140 140  K 2.7*   < > 3.7 3.4* 3.8 3.7 3.5  CL 99   < > 105 103 104 105 103  CO2 24   < > 27 27 26 25 25   GLUCOSE 96   < > 139* 115* 120* 133* 138*  BUN 37*   < > 55* 64* 66* 73* 81*  CREATININE 1.64*   < > 2.28* 2.44* 2.43* 2.46* 2.45*  CALCIUM 7.8*   < > 8.0* 7.9* 8.0* 8.0* 8.3*  MG 1.7  --   --  2.2 2.5*  --   --   PHOS  --   --   --  3.4 3.5  --   --    < > = values in this interval not displayed.     Liver Function Tests: Recent Labs  Lab 08/23/22 0516 08/24/22 0630  AST  --  141*  ALT  --  15  ALKPHOS  --  117  BILITOT  --  0.6  PROT  --  6.1*  ALBUMIN 2.1* 2.0*    No results  for input(s): "LIPASE", "AMYLASE" in the last 168 hours. No results for input(s): "AMMONIA" in the last 168 hours.  CBC: Recent Labs  Lab 08/21/22 0619 08/22/22 0505 08/23/22 0516 08/24/22 0920 08/25/22 0630 08/26/22 0440  WBC 12.0*  11.9* 10.4 12.4* 9.6 10.5 9.2  NEUTROABS 9.6* 9.3*  --  8.2*  --   --   HGB 9.1*  9.1* 8.0* 9.0* 9.0* 8.5* 8.6*  HCT 27.9*  27.9* 25.3* 27.7* 27.6* 26.5* 26.8*  MCV 93.0  92.7 94.1 93.9 93.6 92.7 93.1  PLT 284  267 241 302 279 255 251     Cardiac Enzymes: Recent Labs  Lab 08/23/22 1950  CKTOTAL 31*     BNP: Invalid input(s): "POCBNP"  CBG: Recent Labs  Lab 08/20/22 0508  GLUCAP 89     Microbiology: Results for orders placed or performed during the hospital encounter of 08/18/22  Blood culture (routine x 2)     Status: None   Collection Time:  08/18/22 10:45 AM   Specimen: BLOOD LEFT ARM  Result Value Ref Range Status   Specimen Description BLOOD LEFT ARM  Final   Special Requests   Final    BOTTLES DRAWN AEROBIC AND ANAEROBIC Blood Culture adequate volume   Culture   Final    NO GROWTH 5 DAYS Performed at [facility], [address], [place], [place] [address]    Report Status 08/23/2022 FINAL  Final  Resp Panel by RT-PCR (Flu A&B, Covid) Anterior Nasal Swab     Status: None   Collection Time: 08/18/22 10:46 AM   Specimen: Anterior Nasal Swab  Result Value Ref Range Status   SARS Coronavirus 2 by RT PCR NEGATIVE NEGATIVE Final    Comment: (NOTE) SARS-CoV-2 target nucleic acids are NOT DETECTED.  The SARS-CoV-2 RNA is generally detectable in upper respiratory specimens during the acute phase of infection. The lowest concentration of SARS-CoV-2 viral copies this assay can detect is 138 copies/mL. A negative result does not preclude SARS-Cov-2 infection and should not be used as the sole basis for treatment or other patient management decisions. A negative result may occur with  improper specimen  collection/handling, submission of specimen other than nasopharyngeal swab, presence of viral mutation(s) within the areas targeted by this assay, and inadequate number of viral copies(<138 copies/mL). A negative result must be combined with clinical observations, patient history, and epidemiological information. The expected result is Negative.  Fact Sheet for Patients:  BloggerCourse.com  Fact Sheet for Healthcare Providers:  SeriousBroker.it  This test is no t yet approved or cleared by the [place] FDA and  has been authorized for detection and/or diagnosis of SARS-CoV-2 by FDA under an Emergency Use Authorization (EUA). This EUA will remain  in effect (meaning this test can be used) for the duration of the COVID-19 declaration under Section 564(b)(1) of the Act, 21 U.S.C.section 360bbb-3(b)(1), unless the authorization is terminated  or revoked sooner.       Influenza A by PCR NEGATIVE NEGATIVE Final   Influenza B by PCR NEGATIVE NEGATIVE Final    Comment: (NOTE) The Xpert Xpress SARS-CoV-2/FLU/RSV plus assay is intended as an aid in the diagnosis of influenza from Nasopharyngeal swab specimens and should not be used as a sole basis for treatment. Nasal washings and aspirates are unacceptable for Xpert Xpress SARS-CoV-2/FLU/RSV testing.  Fact Sheet for Patients: BloggerCourse.com  Fact Sheet for Healthcare Providers: SeriousBroker.it  This test is not yet approved or cleared by the [place] FDA and has been authorized for detection and/or diagnosis of SARS-CoV-2 by FDA under an Emergency Use Authorization (EUA). This EUA will remain in effect (meaning this test can be used) for the duration of the COVID-19 declaration under Section 564(b)(1) of the Act, 21 U.S.C. section 360bbb-3(b)(1), unless the authorization is terminated or revoked.  Performed at [facility], [address] [place], [place] [address]   Culture, blood (Routine X 2) w Reflex to ID Panel     Status: None   Collection Time: 08/18/22  9:19 PM   Specimen: BLOOD LEFT ARM  Result Value Ref Range Status   Specimen Description BLOOD LEFT ARM  Final   Special Requests   Final    BOTTLES DRAWN AEROBIC AND ANAEROBIC Blood Culture adequate volume   Culture   Final    NO GROWTH 5 DAYS Performed at [facility], [address] [place], [place] [address]    Report Status 08/23/2022 FINAL  Final  Respiratory (~20  pathogens) panel by PCR     Status: None   Collection Time: 08/21/22  1:50 PM   Specimen: Nasopharyngeal Swab; Respiratory  Result Value Ref Range Status   Adenovirus NOT DETECTED NOT DETECTED Final   Coronavirus 229E NOT DETECTED NOT DETECTED Final    Comment: (NOTE) The Coronavirus on the Respiratory Panel, DOES NOT test for the novel  Coronavirus (2019 nCoV)    Coronavirus HKU1 NOT DETECTED NOT DETECTED Final   Coronavirus NL63 NOT DETECTED NOT DETECTED Final   Coronavirus OC43 NOT DETECTED NOT DETECTED Final   Metapneumovirus NOT DETECTED NOT DETECTED Final   Rhinovirus / Enterovirus NOT DETECTED NOT DETECTED Final   Influenza A NOT DETECTED NOT DETECTED Final   Influenza B NOT DETECTED NOT DETECTED Final   Parainfluenza Virus 1 NOT DETECTED NOT DETECTED Final   Parainfluenza Virus 2 NOT DETECTED NOT DETECTED Final   Parainfluenza Virus 3 NOT DETECTED NOT DETECTED Final   Parainfluenza Virus 4 NOT DETECTED NOT DETECTED Final   Respiratory Syncytial Virus NOT DETECTED NOT DETECTED Final   Bordetella pertussis NOT DETECTED NOT DETECTED Final   Bordetella Parapertussis NOT DETECTED NOT DETECTED Final   Chlamydophila pneumoniae NOT DETECTED NOT DETECTED Final   Mycoplasma pneumoniae NOT DETECTED NOT DETECTED Final    Comment: Performed at [facility], [address], [place], [place] [address]  Culture, blood (Routine X 2) w Reflex to ID  Panel     Status: None   Collection Time: 08/21/22  8:58 PM   Specimen: BLOOD  Result Value Ref Range Status   Specimen Description BLOOD LEFT WRIST  Final   Special Requests   Final    BOTTLES DRAWN AEROBIC AND ANAEROBIC Blood Culture adequate volume   Culture   Final    NO GROWTH 5 DAYS Performed at [facility], [address] [place], [place] [address]    Report Status 08/26/2022 FINAL  Final  Culture, blood (Routine X 2) w Reflex to ID Panel     Status: None   Collection Time: 08/21/22 11:01 PM   Specimen: BLOOD  Result Value Ref Range Status   Specimen Description BLOOD BLOOD LEFT ARM  Final   Special Requests   Final    BOTTLES DRAWN AEROBIC AND ANAEROBIC Blood Culture adequate volume   Culture   Final    NO GROWTH 5 DAYS Performed at [facility], [address], [place], [place] [address]    Report Status 08/26/2022 FINAL  Final  Expectorated Sputum Assessment w Gram Stain, Rflx to Resp Cult     Status: None   Collection Time: 08/22/22 11:22 AM   Specimen: Sputum  Result Value Ref Range Status   Specimen Description SPUTUM  Final   Special Requests EXPSU  Final   Sputum evaluation   Final    THIS SPECIMEN IS ACCEPTABLE FOR SPUTUM CULTURE Performed at [facility], [address] [place], [place] [address]    Report Status 08/22/2022 FINAL  Final  Culture, Respiratory w Gram Stain     Status: None   Collection Time: 08/22/22 11:22 AM   Specimen: SPU  Result Value Ref Range Status   Specimen Description   Final    SPUTUM Performed at [facility], [address] [place], [place] [address]    Special Requests   Final    EXPSU Reflexed from [id number] Performed at [facility], [address] [place], [place] [address]    Gram Stain  Final    ABUNDANT SQUAMOUS EPITHELIAL CELLS PRESENT FEW WBC PRESENT, PREDOMINANTLY PMN ABUNDANT GRAM VARIABLE ROD ABUNDANT GRAM POSITIVE COCCI IN PAIRS    Culture   Final     Normal respiratory flora-no Staph aureus or Pseudomonas seen Performed at [facility], [address], [place], [place] [address]    Report Status 08/25/2022 FINAL  Final    Coagulation Studies: No results for input(s): "LABPROT", "INR" in the last 72 hours.  Urinalysis: No results for input(s): "COLORURINE", "LABSPEC", "PHURINE", "GLUCOSEU", "HGBUR", "BILIRUBINUR", "KETONESUR", "PROTEINUR", "UROBILINOGEN", "NITRITE", "LEUKOCYTESUR" in the last 72 hours.  Invalid input(s): "APPERANCEUR"     Imaging: DG Chest Port 1 View  Result Date: 08/25/2022 CLINICAL DATA:  Increasing shortness of breath EXAM: PORTABLE CHEST 1 VIEW COMPARISON:  02/17/2023 FINDINGS: Cardiac shadow is stable. Aortic calcifications are noted. Lungs are well aerated bilaterally. Persistent airspace opacities are noted in the upper lobes bilaterally. Some increasing left basilar opacity is noted. Right PICC is stable. IMPRESSION: New left basilar airspace opacity. Electronically Signed   By: Alcide Clever M.D.   On: 08/25/2022 09:37     Medications:    albumin human 25 g (08/26/22 1037)   ceFAZolin 2 g (08/26/22 1000)    apixaban  2.5 mg Oral BID   aspirin EC  81 mg Oral QHS   Chlorhexidine Gluconate Cloth  6 each Topical Daily   cyanocobalamin  1,000 mcg Oral Daily   dexamethasone (DECADRON) injection  4 mg Intravenous Q24H   diltiazem  360 mg Oral Daily   feeding supplement  237 mL Oral BID BM   furosemide  60 mg Intravenous Q12H   metoprolol succinate  100 mg Oral Daily   multivitamins with iron  1 tablet Oral Daily   pantoprazole  40 mg Oral Daily   polyethylene glycol  17 g Oral Daily   rosuvastatin  40 mg Oral Daily   senna  1 tablet Oral Daily   acetaminophen, albuterol, dextromethorphan-guaiFENesin, melatonin, ondansetron (ZOFRAN) IV, simethicone, sodium chloride, traMADol  Assessment/ Plan:  [name] is a 87 y.o.  male with recent prolonged admission for aortic ulcer s/p  endovascular repair 07/24/22, MSSA bacteremia with L spine osteomyelitis and discitis, chronic diastolic heart failure, hypertension, hyperlipidemia, coronary artery disease, atrial fibrillation who was admitted to [facility] on 08/18/2022 for HCAP (healthcare-associated pneumonia) [J18.9] Acute on chronic respiratory failure with hypoxia (HCC) [J96.21] Sepsis, due to unspecified organism, unspecified whether acute organ dysfunction present (HCC) [A41.9]     1.  Acute kidney injury with proteinuria and hematuria: Baseline creatinine of 1.15 with normal GFR on 5/29. Serologic work up pending. Serum complements normal. However differential includes AIN, ATN and prerenal azotemia from overdiuresis. No obstruction on renal ultrasound. No recent IV contrast exposure. Nonoliguric urine output. No acute indication for dialysis.  - holding lisinopril - Creatinine remains stable - Will consider renal biopsy if function deteriorates.    2.  Acute on chronic diastolic heart failure.  Ejection fraction 55 to 60% with grade 1 diastolic dysfunction noted.  -Continue IV furosemide 60mg  q12 - Continue albumin 25g IV q12 for 3 days.  - appreciate cardiology input.   3.  Anemia with acute kidney injury: hemoglobin 8.6. PRBC transfusion on 6/18. Hgb stable    LOS: 8   6/24/20241:48 PM

## 2022-08-26 NOTE — Treatment Plan (Signed)
Diagnosis: MSSA bacteremia Lumbar discitis and osteomyelitis Baseline Creatinine 2.45       No Known Allergies   OPAT Orders Discharge antibiotics: Cefazolin 2 grams IV every 8 hours for 6   weeks is the normal dose if cr is normal ( adjust for high creatinine and low crcl) End Date: 09/13/22   [facility] Care Per Protocol:   Labs weekly on Monday while on IV antibiotics: _X_ CBC with differential   _X_ CMP __X CRP X__ ESR     _X_ Please pull PIC at completion of IV antibiotics     Fax weekly lab results  promptly to [phone]   Clinic Follow Up Appt: Dr,.Kevonta Phariss 09/12/22 at 9.15am    call [phone] with any questions

## 2022-08-27 ENCOUNTER — Inpatient Hospital Stay: Payer: Medicare HMO

## 2022-08-27 ENCOUNTER — Inpatient Hospital Stay: Payer: Medicare HMO | Admitting: Infectious Diseases

## 2022-08-27 DIAGNOSIS — N183 Chronic kidney disease, stage 3 unspecified: Secondary | ICD-10-CM

## 2022-08-27 DIAGNOSIS — A419 Sepsis, unspecified organism: Secondary | ICD-10-CM | POA: Diagnosis not present

## 2022-08-27 DIAGNOSIS — J90 Pleural effusion, not elsewhere classified: Secondary | ICD-10-CM | POA: Diagnosis not present

## 2022-08-27 DIAGNOSIS — J9601 Acute respiratory failure with hypoxia: Secondary | ICD-10-CM | POA: Diagnosis not present

## 2022-08-27 DIAGNOSIS — I480 Paroxysmal atrial fibrillation: Secondary | ICD-10-CM | POA: Diagnosis not present

## 2022-08-27 LAB — BLOOD GAS, ARTERIAL
Acid-Base Excess: 5.1 mmol/L — ABNORMAL HIGH (ref 0.0–2.0)
Acid-Base Excess: 6.4 mmol/L — ABNORMAL HIGH (ref 0.0–2.0)
Bicarbonate: 27.8 mmol/L (ref 20.0–28.0)
Bicarbonate: 29.4 mmol/L — ABNORMAL HIGH (ref 20.0–28.0)
O2 Content: 10 L/min
O2 Content: 6 L/min
O2 Saturation: 91.5 %
O2 Saturation: 96.9 %
Patient temperature: 37
Patient temperature: 37
pCO2 arterial: 34 mmHg (ref 32–48)
pCO2 arterial: 36 mmHg (ref 32–48)
pH, Arterial: 7.52 — ABNORMAL HIGH (ref 7.35–7.45)
pH, Arterial: 7.52 — ABNORMAL HIGH (ref 7.35–7.45)
pO2, Arterial: 57 mmHg — ABNORMAL LOW (ref 83–108)
pO2, Arterial: 71 mmHg — ABNORMAL LOW (ref 83–108)

## 2022-08-27 LAB — CBC
HCT: 26.2 % — ABNORMAL LOW (ref 39.0–52.0)
Hemoglobin: 8.3 g/dL — ABNORMAL LOW (ref 13.0–17.0)
MCH: 29.5 pg (ref 26.0–34.0)
MCHC: 31.7 g/dL (ref 30.0–36.0)
MCV: 93.2 fL (ref 80.0–100.0)
Platelets: 248 10*3/uL (ref 150–400)
RBC: 2.81 MIL/uL — ABNORMAL LOW (ref 4.22–5.81)
RDW: 13.8 % (ref 11.5–15.5)
WBC: 10.1 10*3/uL (ref 4.0–10.5)
nRBC: 0 % (ref 0.0–0.2)

## 2022-08-27 LAB — BASIC METABOLIC PANEL WITH GFR
Anion gap: 11 (ref 5–15)
BUN: 89 mg/dL — ABNORMAL HIGH (ref 8–23)
CO2: 27 mmol/L (ref 22–32)
Calcium: 8.4 mg/dL — ABNORMAL LOW (ref 8.9–10.3)
Chloride: 99 mmol/L (ref 98–111)
Creatinine, Ser: 2.4 mg/dL — ABNORMAL HIGH (ref 0.61–1.24)
GFR, Estimated: 26 mL/min — ABNORMAL LOW
Glucose, Bld: 126 mg/dL — ABNORMAL HIGH (ref 70–99)
Potassium: 3.5 mmol/L (ref 3.5–5.1)
Sodium: 137 mmol/L (ref 135–145)

## 2022-08-27 MED ORDER — METHYLPREDNISOLONE 4 MG PO TABS
8.0000 mg | ORAL_TABLET | Freq: Every day | ORAL | Status: AC
  Administered 2022-08-27 – 2022-08-28 (×2): 8 mg via ORAL
  Filled 2022-08-27 (×2): qty 2

## 2022-08-27 MED ORDER — METHYLPREDNISOLONE 4 MG PO TBPK: 4.0000 mg | ORAL_TABLET | ORAL | Status: DC

## 2022-08-27 MED ORDER — METHYLPREDNISOLONE 4 MG PO TBPK
8.0000 mg | ORAL_TABLET | Freq: Every morning | ORAL | Status: DC
  Filled 2022-08-27: qty 21

## 2022-08-27 MED ORDER — METHYLPREDNISOLONE 4 MG PO TABS
4.0000 mg | ORAL_TABLET | Freq: Every day | ORAL | Status: DC
  Administered 2022-08-28: 4 mg via ORAL
  Filled 2022-08-27 (×2): qty 1

## 2022-08-27 MED ORDER — METHYLPREDNISOLONE 4 MG PO TABS
8.0000 mg | ORAL_TABLET | Freq: Every day | ORAL | Status: AC
  Administered 2022-08-27: 8 mg via ORAL
  Filled 2022-08-27: qty 2

## 2022-08-27 MED ORDER — METHYLPREDNISOLONE 4 MG PO TBPK: 4.0000 mg | ORAL_TABLET | Freq: Three times a day (TID) | ORAL | Status: DC

## 2022-08-27 MED ORDER — METHYLPREDNISOLONE 4 MG PO TABS
4.0000 mg | ORAL_TABLET | Freq: Every day | ORAL | Status: DC
  Administered 2022-08-27 – 2022-08-28 (×2): 4 mg via ORAL
  Filled 2022-08-27 (×3): qty 1

## 2022-08-27 MED ORDER — METHYLPREDNISOLONE 4 MG PO TBPK: 8.0000 mg | ORAL_TABLET | Freq: Every evening | ORAL | Status: DC

## 2022-08-27 MED ORDER — METHYLPREDNISOLONE 4 MG PO TABS: 4.0000 mg | ORAL_TABLET | Freq: Every day | ORAL | Status: DC

## 2022-08-27 MED ORDER — METHYLPREDNISOLONE 4 MG PO TBPK: 4.0000 mg | ORAL_TABLET | Freq: Four times a day (QID) | ORAL | Status: DC

## 2022-08-27 NOTE — Progress Notes (Signed)
[facility]  ROUNDING NOTE   Subjective:   Resting quietly in bed Wife at bedside Appetite remains poor  UOP 2.25L   Creatinine stable   Objective:  Vital signs in last 24 hours:  Temp:  [97.7 F (36.5 C)-97.9 F (36.6 C)] 97.7 F (36.5 C) (06/25 1300) Pulse Rate:  [59-69] 66 (06/25 1300) Resp:  [15-22] 17 (06/25 1300) BP: (126-143)/(64-76) 136/67 (06/25 1300) SpO2:  [92 %-94 %] 93 % (06/25 1300) Weight:  [86.7 kg] 86.7 kg (06/25 0439)  Weight change: 0.5 kg Filed Weights   08/23/22 0459 08/26/22 0503 08/27/22 0439  Weight: 90 kg 86.2 kg 86.7 kg    Intake/Output: I/O last 3 completed shifts: In: 1301.3 [P.O.:480; IV Piggyback:821.3] Out: 3750 [Urine:3750]   Intake/Output this shift:  Total I/O In: -  Out: 1055 [Urine:1055]  Physical Exam: General: NAD, laying in bed  Head: Normocephalic, atraumatic. Moist oral mucosal membranes  Eyes: Anicteric  Lungs:  Bilateral crackles, 2L [place]  Heart: Regular rate and rhythm  Abdomen:  Soft, nontender  Extremities:  + peripheral edema.  Neurologic: Nonfocal, moving all four extremities  Skin: No lesions  Access: none    Basic Metabolic Panel: Recent Labs  Lab 08/23/22 0516 08/24/22 0630 08/25/22 0630 08/26/22 0440 08/27/22 0454  NA 140 140 140 140 137  K 3.4* 3.8 3.7 3.5 3.5  CL 103 104 105 103 99  CO2 27 26 25 25 27   GLUCOSE 115* 120* 133* 138* 126*  BUN 64* 66* 73* 81* 89*  CREATININE 2.44* 2.43* 2.46* 2.45* 2.40*  CALCIUM 7.9* 8.0* 8.0* 8.3* 8.4*  MG 2.2 2.5*  --   --   --   PHOS 3.4 3.5  --   --   --      Liver Function Tests: Recent Labs  Lab 08/23/22 0516 08/24/22 0630  AST  --  141*  ALT  --  15  ALKPHOS  --  117  BILITOT  --  0.6  PROT  --  6.1*  ALBUMIN 2.1* 2.0*    No results for input(s): "LIPASE", "AMYLASE" in the last 168 hours. No results for input(s): "AMMONIA" in the last 168 hours.  CBC: Recent Labs  Lab 08/21/22 0619 08/22/22 0505 08/23/22 0516  08/24/22 0920 08/25/22 0630 08/26/22 0440 08/27/22 0454  WBC 12.0*  11.9* 10.4 12.4* 9.6 10.5 9.2 10.1  NEUTROABS 9.6* 9.3*  --  8.2*  --   --   --   HGB 9.1*  9.1* 8.0* 9.0* 9.0* 8.5* 8.6* 8.3*  HCT 27.9*  27.9* 25.3* 27.7* 27.6* 26.5* 26.8* 26.2*  MCV 93.0  92.7 94.1 93.9 93.6 92.7 93.1 93.2  PLT 284  267 241 302 279 255 251 248     Cardiac Enzymes: Recent Labs  Lab 08/23/22 1950  CKTOTAL 31*     BNP: Invalid input(s): "POCBNP"  CBG: No results for input(s): "GLUCAP" in the last 168 hours.   Microbiology: Results for orders placed or performed during the hospital encounter of 08/18/22  Blood culture (routine x 2)     Status: None   Collection Time: 08/18/22 10:45 AM   Specimen: BLOOD LEFT ARM  Result Value Ref Range Status   Specimen Description BLOOD LEFT ARM  Final   Special Requests   Final    BOTTLES DRAWN AEROBIC AND ANAEROBIC Blood Culture adequate volume   Culture   Final    NO GROWTH 5 DAYS Performed at [facility], [address] [place],  [place] [address]    Report Status 08/23/2022 FINAL  Final  Resp Panel by RT-PCR (Flu A&B, Covid) Anterior Nasal Swab     Status: None   Collection Time: 08/18/22 10:46 AM   Specimen: Anterior Nasal Swab  Result Value Ref Range Status   SARS Coronavirus 2 by RT PCR NEGATIVE NEGATIVE Final    Comment: (NOTE) SARS-CoV-2 target nucleic acids are NOT DETECTED.  The SARS-CoV-2 RNA is generally detectable in upper respiratory specimens during the acute phase of infection. The lowest concentration of SARS-CoV-2 viral copies this assay can detect is 138 copies/mL. A negative result does not preclude SARS-Cov-2 infection and should not be used as the sole basis for treatment or other patient management decisions. A negative result may occur with  improper specimen collection/handling, submission of specimen other than nasopharyngeal swab, presence of viral mutation(s) within the areas targeted by this  assay, and inadequate number of viral copies(<138 copies/mL). A negative result must be combined with clinical observations, patient history, and epidemiological information. The expected result is Negative.  Fact Sheet for Patients:  BloggerCourse.com  Fact Sheet for Healthcare Providers:  SeriousBroker.it  This test is no t yet approved or cleared by the [place] FDA and  has been authorized for detection and/or diagnosis of SARS-CoV-2 by FDA under an Emergency Use Authorization (EUA). This EUA will remain  in effect (meaning this test can be used) for the duration of the COVID-19 declaration under Section 564(b)(1) of the Act, 21 U.S.C.section 360bbb-3(b)(1), unless the authorization is terminated  or revoked sooner.       Influenza A by PCR NEGATIVE NEGATIVE Final   Influenza B by PCR NEGATIVE NEGATIVE Final    Comment: (NOTE) The Xpert Xpress SARS-CoV-2/FLU/RSV plus assay is intended as an aid in the diagnosis of influenza from Nasopharyngeal swab specimens and should not be used as a sole basis for treatment. Nasal washings and aspirates are unacceptable for Xpert Xpress SARS-CoV-2/FLU/RSV testing.  Fact Sheet for Patients: BloggerCourse.com  Fact Sheet for Healthcare Providers: SeriousBroker.it  This test is not yet approved or cleared by the [place] FDA and has been authorized for detection and/or diagnosis of SARS-CoV-2 by FDA under an Emergency Use Authorization (EUA). This EUA will remain in effect (meaning this test can be used) for the duration of the COVID-19 declaration under Section 564(b)(1) of the Act, 21 U.S.C. section 360bbb-3(b)(1), unless the authorization is terminated or revoked.  Performed at [facility], [address] [place], [place] [address]   Culture, blood (Routine X 2) w Reflex to ID Panel     Status: None    Collection Time: 08/18/22  9:19 PM   Specimen: BLOOD LEFT ARM  Result Value Ref Range Status   Specimen Description BLOOD LEFT ARM  Final   Special Requests   Final    BOTTLES DRAWN AEROBIC AND ANAEROBIC Blood Culture adequate volume   Culture   Final    NO GROWTH 5 DAYS Performed at [facility], [address] [place], [place] [address]    Report Status 08/23/2022 FINAL  Final  Respiratory (~20 pathogens) panel by PCR     Status: None   Collection Time: 08/21/22  1:50 PM   Specimen: Nasopharyngeal Swab; Respiratory  Result Value Ref Range Status   Adenovirus NOT DETECTED NOT DETECTED Final   Coronavirus 229E NOT DETECTED NOT DETECTED Final    Comment: (NOTE) The Coronavirus on the Respiratory Panel, DOES NOT test for the novel  Coronavirus (  2019 nCoV)    Coronavirus HKU1 NOT DETECTED NOT DETECTED Final   Coronavirus NL63 NOT DETECTED NOT DETECTED Final   Coronavirus OC43 NOT DETECTED NOT DETECTED Final   Metapneumovirus NOT DETECTED NOT DETECTED Final   Rhinovirus / Enterovirus NOT DETECTED NOT DETECTED Final   Influenza A NOT DETECTED NOT DETECTED Final   Influenza B NOT DETECTED NOT DETECTED Final   Parainfluenza Virus 1 NOT DETECTED NOT DETECTED Final   Parainfluenza Virus 2 NOT DETECTED NOT DETECTED Final   Parainfluenza Virus 3 NOT DETECTED NOT DETECTED Final   Parainfluenza Virus 4 NOT DETECTED NOT DETECTED Final   Respiratory Syncytial Virus NOT DETECTED NOT DETECTED Final   Bordetella pertussis NOT DETECTED NOT DETECTED Final   Bordetella Parapertussis NOT DETECTED NOT DETECTED Final   Chlamydophila pneumoniae NOT DETECTED NOT DETECTED Final   Mycoplasma pneumoniae NOT DETECTED NOT DETECTED Final    Comment: Performed at [facility], [address] [place], [place] [address]  Culture, blood (Routine X 2) w Reflex to ID Panel     Status: None   Collection Time: 08/21/22  8:58 PM   Specimen: BLOOD  Result Value Ref Range Status   Specimen  Description BLOOD LEFT WRIST  Final   Special Requests   Final    BOTTLES DRAWN AEROBIC AND ANAEROBIC Blood Culture adequate volume   Culture   Final    NO GROWTH 5 DAYS Performed at [facility], [address] [place], [place] [address]    Report Status 08/26/2022 FINAL  Final  Culture, blood (Routine X 2) w Reflex to ID Panel     Status: None   Collection Time: 08/21/22 11:01 PM   Specimen: BLOOD  Result Value Ref Range Status   Specimen Description BLOOD BLOOD LEFT ARM  Final   Special Requests   Final    BOTTLES DRAWN AEROBIC AND ANAEROBIC Blood Culture adequate volume   Culture   Final    NO GROWTH 5 DAYS Performed at [facility], [address], [place], [place] [address]    Report Status 08/26/2022 FINAL  Final  Expectorated Sputum Assessment w Gram Stain, Rflx to Resp Cult     Status: None   Collection Time: 08/22/22 11:22 AM   Specimen: Sputum  Result Value Ref Range Status   Specimen Description SPUTUM  Final   Special Requests EXPSU  Final   Sputum evaluation   Final    THIS SPECIMEN IS ACCEPTABLE FOR SPUTUM CULTURE Performed at [facility], [address] [place], [place] [address]    Report Status 08/22/2022 FINAL  Final  Culture, Respiratory w Gram Stain     Status: None   Collection Time: 08/22/22 11:22 AM   Specimen: SPU  Result Value Ref Range Status   Specimen Description   Final    SPUTUM Performed at [facility], [address] [place], [place] [address]    Special Requests   Final    EXPSU Reflexed from [id number] Performed at [facility], [address] [place], [place] [address]    Gram Stain   Final    ABUNDANT SQUAMOUS EPITHELIAL CELLS PRESENT FEW WBC PRESENT, PREDOMINANTLY PMN ABUNDANT GRAM VARIABLE ROD ABUNDANT GRAM POSITIVE COCCI IN PAIRS    Culture   Final    Normal respiratory flora-no Staph aureus or Pseudomonas seen Performed at [facility], [address]  [place], [place] [address]    Report Status 08/25/2022 FINAL  Final    Coagulation Studies: No results for input(s): "LABPROT", "INR" in the last 72 hours.  Urinalysis: No results for input(s): "COLORURINE", "LABSPEC", "PHURINE", "GLUCOSEU", "HGBUR", "BILIRUBINUR", "KETONESUR", "PROTEINUR", "UROBILINOGEN", "NITRITE", "LEUKOCYTESUR" in the last 72 hours.  Invalid input(s): "APPERANCEUR"     Imaging: No results found.   Medications:    albumin human 25 g (08/27/22 0955)   ceFAZolin 2 g (08/27/22 9323)    apixaban  2.5 mg Oral BID   Chlorhexidine Gluconate Cloth  6 each Topical Daily   cyanocobalamin  1,000 mcg Oral Daily   diltiazem  360 mg Oral Daily   feeding supplement  237 mL Oral BID BM   furosemide  60 mg Intravenous Q12H   methylPREDNISolone  4 mg Oral Daily   And   methylPREDNISolone  8 mg Oral QHS   [START ON 08/28/2022] methylPREDNISolone  4 mg Oral Daily   And   [START ON 08/29/2022] methylPREDNISolone  4 mg Oral Daily   metoprolol succinate  100 mg Oral Daily   multivitamins with iron  1 tablet Oral Daily   pantoprazole  40 mg Oral Daily   polyethylene glycol  17 g Oral Daily   rosuvastatin  40 mg Oral Daily   senna  1 tablet Oral Daily   acetaminophen, albuterol, dextromethorphan-guaiFENesin, melatonin, ondansetron (ZOFRAN) IV, simethicone, sodium chloride, traMADol  Assessment/ Plan:  Brett Riley is a 87 y.o.  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  Acute kidney injury with proteinuria  and hematuria: Baseline creatinine of 1.15 with normal GFR on 5/29. Serologic work up pending. Serum complements normal. However differential includes AIN, ATN and prerenal azotemia from overdiuresis. No obstruction on renal ultrasound. No recent IV contrast exposure. Nonoliguric urine output. No acute indication for dialysis.  - holding lisinopril - This may represent new baseline - Will consider renal biopsy if function deteriorates.    2.  Acute on chronic diastolic heart failure.  Ejection fraction 55 to 60% with grade 1 diastolic dysfunction noted.  -Continue IV furosemide 60mg  q12 - Continue albumin 25g IV q12 for 3 days.  - appreciate cardiology input.   3.  Anemia with acute kidney injury: hemoglobin 8.3. PRBC transfusion on 6/18. Hgb stable    LOS: 9   6/25/20242:33 PM

## 2022-08-27 NOTE — Progress Notes (Signed)
Rounding Note    Patient Name: Brett Riley Date of Encounter: 08/27/2022  [facility] Cardiologist: Lorine Bears, MD   Subjective   Patient seen on a.m. rounds.  Denies any chest pain or worsening shortness of breath.  States that his legs continue to remain weak as he is at lack of activity.  He is now on 3 L of O2 via nasal cannula.  -1.1 L output in the last 24 hours and -11.4 L output since admission.  Inpatient Medications    Scheduled Meds:  apixaban  2.5 mg Oral BID   Chlorhexidine Gluconate Cloth  6 each Topical Daily   cyanocobalamin  1,000 mcg Oral Daily   diltiazem  360 mg Oral Daily   feeding supplement  237 mL Oral BID BM   furosemide  60 mg Intravenous Q12H   methylPREDNISolone  4 mg Oral Daily   And   methylPREDNISolone  8 mg Oral QHS   [START ON 08/28/2022] methylPREDNISolone  4 mg Oral Daily   And   [START ON 08/29/2022] methylPREDNISolone  4 mg Oral Daily   metoprolol succinate  100 mg Oral Daily   multivitamins with iron  1 tablet Oral Daily   pantoprazole  40 mg Oral Daily   polyethylene glycol  17 g Oral Daily   rosuvastatin  40 mg Oral Daily   senna  1 tablet Oral Daily   Continuous Infusions:  albumin human 60 mL/hr at 08/27/22 1511   ceFAZolin Stopped (08/27/22 0948)   PRN Meds: acetaminophen, albuterol, dextromethorphan-guaiFENesin, melatonin, ondansetron (ZOFRAN) IV, simethicone, sodium chloride, traMADol   Vital Signs    Vitals:   08/26/22 2353 08/27/22 0439 08/27/22 0833 08/27/22 1300  BP: 135/68 (!) 143/76 133/64 136/67  Pulse: 63 66 69 66  Resp: 16 20 (!) 22 17  Temp: 97.7 F (36.5 C) 97.8 F (36.6 C) 97.9 F (36.6 C) 97.7 F (36.5 C)  TempSrc: Oral Oral Oral Oral  SpO2: 92% 94% 94% 93%  Weight:  86.7 kg    Height:        Intake/Output Summary (Last 24 hours) at 08/27/2022 1650 Last data filed at 08/27/2022 1511 Gross per 24 hour  Intake 1023.7 ml  Output 2255 ml  Net -1231.3 ml      08/27/2022    4:39 AM  08/26/2022    5:03 AM 08/23/2022    4:59 AM  Last 3 Weights  Weight (lbs) 191 lb 2.2 oz 190 lb 0.6 oz 198 lb 6.6 oz  Weight (kg) 86.7 kg 86.2 kg 90 kg      Telemetry    Sinus rhythm with rates in the 60s- Personally Reviewed  ECG    Tracings- Personally Reviewed  Physical Exam   GEN: No acute distress.   Neck: No JVD Cardiac: RRR, no murmurs, rubs, or gallops.  Respiratory: Clear to the upper lobes but diminished bases to auscultation bilaterally.  Rations are unlabored at rest on 3 L of O2 via nasal cannula GI: Soft, nontender, mildly distended  MS: 1+ edema to BLE; No deformity. Neuro:  Nonfocal  Psych: Normal affect   Labs    High Sensitivity Troponin:   Recent Labs  Lab 08/18/22 1020 08/18/22 1805 08/18/22 2033 08/18/22 2303  TROPONINIHS 258* 1,775* 1,726* 1,446*     Chemistry Recent Labs  Lab 08/23/22 0516 08/24/22 0630 08/25/22 0630 08/26/22 0440 08/27/22 0454  NA 140 140 140 140 137  K 3.4* 3.8 3.7 3.5 3.5  CL 103 104  105 103 99  CO2 27 26 25 25 27   GLUCOSE 115* 120* 133* 138* 126*  BUN 64* 66* 73* 81* 89*  CREATININE 2.44* 2.43* 2.46* 2.45* 2.40*  CALCIUM 7.9* 8.0* 8.0* 8.3* 8.4*  MG 2.2 2.5*  --   --   --   PROT  --  6.1*  --   --   --   ALBUMIN 2.1* 2.0*  --   --   --   AST  --  141*  --   --   --   ALT  --  15  --   --   --   ALKPHOS  --  117  --   --   --   BILITOT  --  0.6  --   --   --   GFRNONAA 25* 26* 25* 25* 26*  ANIONGAP 10 10 10 12 11     Lipids No results for input(s): "CHOL", "TRIG", "HDL", "LABVLDL", "LDLCALC", "CHOLHDL" in the last 168 hours.  Hematology Recent Labs  Lab 08/25/22 0630 08/26/22 0440 08/27/22 0454  WBC 10.5 9.2 10.1  RBC 2.86* 2.88* 2.81*  HGB 8.5* 8.6* 8.3*  HCT 26.5* 26.8* 26.2*  MCV 92.7 93.1 93.2  MCH 29.7 29.9 29.5  MCHC 32.1 32.1 31.7  RDW 13.8 13.8 13.8  PLT 255 251 248   Thyroid No results for input(s): "TSH", "FREET4" in the last 168 hours.  BNP Recent Labs  Lab 08/21/22 1655  BNP 673.3*     DDimer No results for input(s): "DDIMER" in the last 168 hours.   Radiology    DG Knee 1-2 Views Right  Result Date: 08/27/2022 CLINICAL DATA:  Right knee pain. EXAM: RIGHT KNEE - 1-2 VIEW COMPARISON:  Right knee radiographs 07/31/2022 FINDINGS: Minimal medial compartment joint space narrowing. Small joint effusion, minimally decreased from prior. Minimal superior patellar degenerative spurring. No acute fracture or dislocation. Moderate atherosclerotic calcifications. IMPRESSION: 1. Minimal medial compartment osteoarthritis. 2. Small joint effusion. Electronically Signed   By: Neita Garnet M.D.   On: 08/27/2022 15:25    Cardiac Studies   Limited echo 08/19/2022: 1. Left ventricular ejection fraction, by estimation, is 50 to 55%. The  left ventricle has low normal function. Left ventricular endocardial  border not optimally defined to evaluate regional wall motion.   2. Right ventricular systolic function is normal.  __________   TEE 08/05/2022: 1. Left ventricular ejection fraction, by estimation, is 55 to 60%. The  left ventricle has normal function.   2. Right ventricular systolic function is normal. The right ventricular  size is normal.   3. No left atrial/left atrial appendage thrombus was detected.   4. The mitral valve is normal in structure. Mild mitral valve  regurgitation.   5. The aortic valve is tricuspid. Aortic valve regurgitation is trivial.   Patient Profile     87 y.o. 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  Assessment & Plan    Acute respiratory failure -Initially treated with IV Lasix held for worsening renal function and has been restarted  by nephrology -CT scan of the chest revealed interstitial opacities concerning for atypical infection -Bilateral pleural effusions with thoracentesis attempted but patient's inability to lie flat prevented procedure from being completed -Attempted again  on 6/21 with resultant trivial fluid on the left and very small fluid pocket on the right -Continued management per IM  Acute on chronic HFpEF -Limited echocardiogram revealed LVEF 50-55% -Continued on furosemide 60 mg IV twice daily per nephrology -Continues with swelling to bilateral lower extremities -No SGLT2 inhibitor with AKI -Daily weights, I's and O's, low-sodium diet  NSTEMI versus demand ischemia -Expected demand/supply mismatch in the setting of pneumonitis/hypoxia, fever, infection, and anemia -High-sensitivity troponin peaked at 1775 -Continues to remain chest pain-free -EKG and telemetry with no ischemic changes noted -IV heparin stopped for rectal bleeding -No plans for ischemic workup given rectal bleeding -Continued on apixaban and lieu of aspirin, beta-blocker, and statin therapy  Symptomatic anemia -Hemoglobin 8.3 -Yesterday 8.6 -Recommend keeping hemoglobin greater than 8 -Daily CBC  AKI on CKD stage III -Serum creatinine 2.4 -Baseline serum creatinine 1.15 -PTA lisinopril placed on hold -Daily BMP -Monitor urine output -Monitor/trend/replete electrolytes as needed -Continues to be followed by nephrology who is managing diuretics  Paroxysmal atrial fibrillation -Currently maintaining sinus rhythm -Continued on apixaban 2.5 mg twice daily for stroke prophylaxis -Previously was on amiodarone that has been discontinued -Continued on diltiazem 360 mg daily -Continue with telemetry monitoring  Essential hypertension  -Blood pressure 133/64 -Continued on diltiazem, furosemide, Toprol-XL -Vital signs per unit protocol  Hyperlipidemia -LDL 34 -Continued on rosuvastatin 40 mg daily  9.   MSSA  bacteremia -Management per IM      For questions or updates, please contact [facility] Please consult [email or web address] for contact info under        Signed, George Haggart, NP  08/27/2022, 4:50 PM

## 2022-08-27 NOTE — Evaluation (Signed)
Physical Therapy Evaluation Patient Details Name: Brett Riley MRN: [id number] DOB: 31-Aug-1935 Today's Date: 08/27/2022  History of Present Illness  Brett Riley is a 87 y/o M admitted with respiratory failure; other PMH includes MI, CHF, a-fib, HTN, anemia. Was at SNF doing rehab prior to admission; was previously admitted 5/18-6/7.  Clinical Impression  Patient supine in bed receiving a bath from nursing upon arrival. Performed rolling b/l min assist at his hips for maintaining positioning. Patient had 2 BM's during the session and was max assist for hygiene. Patient was previously at [facility] receiving PT after a previous admission. Discussed patients wife expectation for discharge and support level to be provided at home. Performed SLR on LLE x 5 and ankle pumps b/l x 10 to assess strength. Unable to move RLE against gravity and reports R knee pain that began during his previous SNF stay. Patient currently has deficits with mobility, strength, ROM, endurance, balance, coordination and safe DME use. Pt will continue to receive skilled PT services while admitted and will defer to TOC/care team for updates regarding disposition planning.      Recommendations for follow up therapy are one component of a multi-disciplinary discharge planning process, led by the attending physician.  Recommendations may be updated based on patient status, additional functional criteria and insurance authorization.  Follow Up Recommendations Can patient physically be transported by private vehicle: No     Assistance Recommended at Discharge Frequent or constant Supervision/Assistance  Patient can return home with the following  Two people to help with walking and/or transfers;Two people to help with bathing/dressing/bathroom;Assistance with cooking/housework;Assist for transportation;Help with stairs or ramp for entrance    Equipment Recommendations None recommended by PT  Recommendations for Other Services        Functional Status Assessment Patient has had a recent decline in their functional status and demonstrates the ability to make significant improvements in function in a reasonable and predictable amount of time.     Precautions / Restrictions Precautions Precautions: Fall Restrictions Weight Bearing Restrictions: No      Mobility  Bed Mobility Overal bed mobility: Needs Assistance Bed Mobility: Rolling Rolling: Min assist         General bed mobility comments: Rolled b/l for hygeine due to small BM x 2. Min asist needed at hips for maintaining positioning.    Transfers                   General transfer comment: unable to assess due to multiple Bms during session and nursing administering medications.    Ambulation/Gait               General Gait Details: unable to assess due to multiple BMs and nursing administering medications.  Stairs            Wheelchair Mobility    Modified Rankin (Stroke Patients Only)       Balance                                             Pertinent Vitals/Pain Pain Assessment Pain Assessment: 0-10 Pain Score: 4  Pain Location: R knee Pain Descriptors / Indicators: Guarding, Discomfort, Grimacing, Aching Pain Intervention(s): Limited activity within patient's tolerance, Monitored during session    Home Living Family/patient expects to be discharged to:: Private residence Living Arrangements: Spouse/significant other Available Help at Discharge: Family;Available 24 hours/day  Type of Home: House Home Access: Stairs to enter Entrance Stairs-Rails: Right Entrance Stairs-Number of Steps: 2   Home Layout: Two level;Able to live on main level with bedroom/bathroom Home Equipment: None      Prior Function Prior Level of Function : Independent/Modified Independent;Working/employed;Driving             Mobility Comments: Pt able to be active, driving, working, mowing yard, etc ADLs  Comments: Pt is very independent. 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     Hand Dominance   Dominant Hand: Right    Extremity/Trunk Assessment   Upper Extremity Assessment Upper Extremity Assessment: Overall WFL for tasks assessed    Lower Extremity Assessment Lower Extremity Assessment: RLE deficits/detail RLE Deficits / Details: Generalized weakness in RLE due to L knee pain. Pain started at SNF stay RLE Sensation: WNL RLE Coordination: decreased gross motor       Communication   Communication: HOH  Cognition Arousal/Alertness: Awake/alert Behavior During Therapy: WFL for tasks assessed/performed Overall Cognitive Status: Within Functional Limits for tasks assessed                                 General Comments: Not formally assessed, but answered all questions appropriately.        General Comments      Exercises General Exercises - Lower Extremity Ankle Circles/Pumps: AROM, Both, 5 reps, Supine Straight Leg Raises: AROM, Left, 5 reps, Supine Other Exercises Other Exercises: rolling b/l for hygeine due to BM x 2. max assist for hygeine.   Assessment/Plan    PT Assessment Patient needs continued PT services  PT Problem List Decreased strength;Decreased range of motion;Decreased activity tolerance;Decreased balance;Decreased mobility;Decreased knowledge of use of DME;Decreased safety awareness;Pain       PT Treatment Interventions Gait training;DME instruction;Functional mobility training;Therapeutic activities;Therapeutic exercise;Balance training;Neuromuscular re-education;Patient/family education;Stair training    PT Goals (Current goals can be found in the Care Plan section)  Acute Rehab PT Goals Patient Stated Goal: get stronger PT Goal Formulation: With patient/family Time For Goal Achievement: 09/10/22 Potential to Achieve Goals: Fair    Frequency Min 2X/week     Co-evaluation                AM-PAC PT "6 Clicks" Mobility  Outcome Measure Help needed turning from your back to your side while in a flat bed without using bedrails?: A Lot Help needed moving from lying on your back to sitting on the side of a flat bed without using bedrails?: A Lot Help needed moving to and from a bed to a chair (including a wheelchair)?: Total Help needed standing up from a chair using your arms (e.g., wheelchair or bedside chair)?: Total Help needed to walk in hospital room?: Total Help needed climbing 3-5 steps with a railing? : Total 6 Click Score: 8    End of Session   Activity Tolerance: Patient tolerated treatment well;No increased pain;Patient limited by fatigue Patient left: in bed;with call bell/phone within reach Nurse Communication: Mobility status PT Visit Diagnosis: Muscle weakness (generalized) (M62.81);Difficulty in walking, not elsewhere classified (R26.2);Unsteadiness on feet (R26.81);Other abnormalities of gait and mobility (R26.89)    Time: 0945-1000 PT Time Calculation (min) (ACUTE ONLY): 15 min   Charges:              Malachi Carl, SPT   Malachi Carl 08/27/2022, 12:15 PM

## 2022-08-27 NOTE — NC FL2 (Signed)
[place] MEDICAID FL2 LEVEL OF CARE FORM     IDENTIFICATION  Patient Name: Brett Riley Birthdate: December 25, 1935 Sex: male Admission Date (Current Location): 08/18/2022  [employer] and [place] Number:  Chiropodist and Address:  [facility], [address], [place], [place] [address]      Provider Number: [id number]  Attending Physician Name and Address:  Leeroy Bock, MD  Relative Name and Phone Number:       Current Level of Care: Hospital Recommended Level of Care: Skilled Nursing Facility Prior Approval Number:    Date Approved/Denied:   PASRR Number: [id number]  Discharge Plan: SNF    Current Diagnoses: Patient Active Problem List   Diagnosis Date Noted   Stage 3 chronic kidney disease (HCC) 08/27/2022   Status post thoracentesis 08/25/2022   Heart failure with preserved ejection fraction (HCC) 08/25/2022   Acute on chronic HFrEF (heart failure with reduced ejection fraction) (HCC) 08/24/2022   Pleural effusion on right 08/24/2022   Pleural effusion 08/23/2022   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   HCAP (healthcare-associated pneumonia) 08/21/2022   Sepsis (HCC) 08/21/2022   Paroxysmal atrial fibrillation (HCC) 08/20/2022   Acute respiratory failure with hypoxia (HCC) 08/18/2022   Myocardial injury 08/18/2022   Acute on chronic heart failure with preserved ejection fraction (HFpEF) (HCC) 08/18/2022   Chronic kidney disease, stage 3a (HCC) 08/18/2022   Normocytic anemia 08/18/2022   Atrial fibrillation, chronic (HCC) 08/18/2022   MSSA bacteremia 08/05/2022   Vertebral osteomyelitis (HCC) 08/05/2022   Acute osteomyelitis of lumbar spine (HCC) 08/03/2022   Metabolic acidosis 08/03/2022   Discitis of lumbar region 08/03/2022   Epidural abscess 08/03/2022   MSSA (methicillin susceptible Staphylococcus aureus) septicemia (HCC) 08/02/2022   AKI (acute kidney injury) (HCC) 08/01/2022   Effusion of right knee 08/01/2022    Acute metabolic encephalopathy 08/01/2022   Hyponatremia 07/31/2022   Overweight (BMI 25.0-29.9) 07/31/2022   Persistent atrial fibrillation (HCC) 07/30/2022   Atrial fibrillation with rapid ventricular response (HCC) 07/23/2022   PVD (peripheral vascular disease) (HCC) 07/21/2022   Penetrating atherosclerotic ulcer of aorta (HCC) 07/20/2022   Uncontrolled hypertension 07/20/2022   Dyslipidemia 07/20/2022   Coronary artery disease 07/20/2022   Hypoxemia 07/20/2022   Scalp lesion 10/14/2021   Abnormal CT of the abdomen 04/09/2021   Open wound of skin 10/16/2020   Atherosclerotic ulcer of aorta (HCC) 08/27/2020   Leg pain 01/10/2020   Fever 10/03/2019   Hypertension 06/01/2019   Aneurysm artery, 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   Dizziness 08/04/2016   Abdominal bruit 08/04/2016   Carotid stenosis, asymptomatic, left 02/15/2016   CAD (coronary artery disease)    Preop cardiovascular exam    Health care maintenance 09/11/2014   Hip region mass 12/12/2013   Soft tissue mass 11/28/2013   Fatigue 07/28/2013   Environmental allergies 07/28/2013   Carotid artery disease (HCC) 01/21/2013   Thrombocytopenia (HCC) 07/26/2012   Anemia 07/26/2012   Essential hypertension, benign 05/24/2012   Hyperlipidemia 05/24/2012    Orientation RESPIRATION BLADDER Height & Weight     Self, Time, Situation, Place  O2 (Nasal Cannula 3 L. Will not need bipap at discharge. Has not used since 6/20.) Incontinent, External catheter Weight: 191 lb 2.2 oz (86.7 kg) Height:  6' (182.9 cm)  BEHAVIORAL SYMPTOMS/MOOD NEUROLOGICAL BOWEL NUTRITION STATUS   (None)  (None) Incontinent Diet (Heart healthy. Fluid restriction 1500 mL.)  AMBULATORY STATUS COMMUNICATION OF NEEDS Skin   Extensive Assist Verbally Bruising, Other (Comment) (Erythema/redness.)  Personal Care Assistance Level of Assistance  Bathing, Dressing, Feeding Bathing Assistance: Maximum assistance Feeding assistance:  Limited assistance Dressing Assistance: Maximum assistance     Functional Limitations Info  Sight, Hearing, Speech Sight Info: Adequate Hearing Info: Adequate Speech Info: Adequate    SPECIAL CARE FACTORS FREQUENCY  PT (By licensed PT), OT (By licensed OT)     PT Frequency: 5 x week OT Frequency: 5 x week            Contractures Contractures Info: Not present    Additional Factors Info  Code Status, Allergies Code Status Info: DNR Allergies Info: NKDA           Current Medications (08/27/2022):  This is the current hospital active medication list Current Facility-Administered Medications  Medication Dose Route Frequency Provider Last Rate Last Admin   acetaminophen (TYLENOL) tablet 650 mg  650 mg Oral Q6H PRN Lorretta Harp, MD       albumin human 25 % solution 25 g  25 g Intravenous Q12H Kolluru, Sarath, MD 60 mL/hr at 08/27/22 1511 Infusion Verify at 08/27/22 1511   albuterol (PROVENTIL) (2.5 MG/3ML) 0.083% nebulizer solution 2.5 mg  2.5 mg Nebulization Q4H PRN Lorretta Harp, MD   2.5 mg at 08/25/22 1943   apixaban (ELIQUIS) tablet 2.5 mg  2.5 mg Oral BID [name], RPH   2.5 mg at 08/27/22 0914   ceFAZolin (ANCEF) IVPB 2g/100 mL premix  2 g Intravenous Q12H Aleda Grana, RPH   Stopped at 08/27/22 5784   Chlorhexidine Gluconate Cloth 2 % PADS 6 each  6 each Topical Daily Lorretta Harp, MD   6 each at 08/27/22 1016   cyanocobalamin (VITAMIN B12) tablet 1,000 mcg  1,000 mcg Oral Daily Lorretta Harp, MD   1,000 mcg at 08/27/22 0914   dextromethorphan-guaiFENesin (MUCINEX DM) 30-600 MG per 12 hr tablet 1 tablet  1 tablet Oral BID PRN Lorretta Harp, MD   1 tablet at 08/24/22 0905   diltiazem (CARDIZEM CD) 24 hr capsule 360 mg  360 mg Oral Daily Antonieta Iba, MD   360 mg at 08/27/22 0914   feeding supplement (ENSURE ENLIVE / ENSURE PLUS) liquid 237 mL  237 mL Oral BID BM Lorretta Harp, MD   237 mL at 08/27/22 1436   furosemide (LASIX) injection 60 mg  60 mg Intravenous Q12H  Jamelle Rushing L, MD   60 mg at 08/27/22 1433   melatonin tablet 2.5 mg  2.5 mg Oral QHS PRN Andris Baumann, MD   2.5 mg at 08/25/22 2058   methylPREDNISolone (MEDROL) tablet 4 mg  4 mg Oral Daily Leeroy Bock, MD   4 mg at 08/27/22 1238   And   methylPREDNISolone (MEDROL) tablet 8 mg  8 mg Oral QHS Leeroy Bock, MD       [START ON 08/28/2022] methylPREDNISolone (MEDROL) tablet 4 mg  4 mg Oral Daily Leeroy Bock, MD       And   [START ON 08/29/2022] methylPREDNISolone (MEDROL) tablet 4 mg  4 mg Oral Daily Leeroy Bock, MD       metoprolol succinate (TOPROL-XL) 24 hr tablet 100 mg  100 mg Oral Daily Lorretta Harp, MD   100 mg at 08/27/22 0913   multivitamins with iron tablet 1 tablet  1 tablet Oral Daily Sunnie Nielsen, DO   1 tablet at 08/27/22 0914   ondansetron (ZOFRAN) injection 4 mg  4 mg Intravenous Q8H PRN Lorretta Harp, MD  pantoprazole (PROTONIX) EC tablet 40 mg  40 mg Oral Daily Lorretta Harp, MD   40 mg at 08/27/22 0914   polyethylene glycol (MIRALAX / GLYCOLAX) packet 17 g  17 g Oral Daily Lorretta Harp, MD   17 g at 08/26/22 0951   rosuvastatin (CRESTOR) tablet 40 mg  40 mg Oral Daily Mansy, Jan A, MD   40 mg at 08/27/22 0913   senna (SENOKOT) tablet 8.6 mg  1 tablet Oral Daily Lorretta Harp, MD   8.6 mg at 08/26/22 0951   simethicone (MYLICON) chewable tablet 80 mg  80 mg Oral QID PRN Mansy, Jan A, MD   80 mg at 08/20/22 2121   sodium chloride (OCEAN) 0.65 % nasal spray 1 spray  1 spray Each Nare PRN Sunnie Nielsen, DO   1 spray at 08/27/22 0319   traMADol (ULTRAM) tablet 50 mg  50 mg Oral Q6H PRN Lorretta Harp, MD   50 mg at 08/25/22 0038     Discharge Medications: Please see discharge summary for a list of discharge medications.  Relevant Imaging Results:  Relevant Lab Results:   Additional Information SS#: [id number]. Was at [facility] from 6/7-6/16 (9 days). Cefazolin 2 grams IV every 8 hours. End date 7/12.  Margarito Liner,  LCSW

## 2022-08-27 NOTE — Progress Notes (Addendum)
PROGRESS NOTE  Brett Riley UJW:[id number] DOB: 10-07-1935 DOA: 08/18/2022 PCP: Dale [place], MD  Hospital course: Brett Riley is a 87 y.o. male with medical history significant of chronic dCHF, HTN, HLD, CAD, CKD-3a, anemia, chronic A fib on Eliquis, who presents 08/18/2022 to ED from rehab, chief complaint SOB.  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 Net IO Since Admission: -5,549.14 mL [08/20/22 1608]. Heparin stopped d/t rectal bleeding overnight.  06/19: euvolemic per cardio, d/c lasix, O2 requirement still high, CT chest reviewed, ?edema/atypical infection, given clinically worsening respiratory status have asked pulmonary and ID to consult. Have d/c amiodarone and ordered steroids in case amio toxicity.  06/20: ID and pulmonary assisting with the management.  6/22-6/25: patient feels clinically improved. Stable with oxygen via [place] and weaned down to 2-3L. Patient requiring judicial adjustments to balance between hypervolemia and renal injury with diuresis. Unable to pull fluid from lungs with thoracentesis so medical management is best option. His IV Abx to treat his vertebral osteomyelitis from previous admission continue unchanged and managed by ID.   Assessment/Plan: Principal Problem:   Acute respiratory failure with hypoxia (HCC) Active Problems:   Acute on chronic heart failure with preserved ejection fraction (HFpEF) (HCC)   MSSA bacteremia   Vertebral osteomyelitis (HCC)   CAD (coronary artery disease)   Myocardial injury   Hypertension   Hyperlipidemia    Chronic kidney disease, stage 3a (HCC)   Normocytic anemia   Atrial fibrillation, chronic (HCC)   Paroxysmal atrial fibrillation (HCC)   HCAP (healthcare-associated pneumonia)   Sepsis (HCC)   Pleural effusion   Amiodarone pulmonary toxicity   Acute on chronic HFrEF (heart failure with reduced ejection fraction) (HCC)   Pleural effusion on right   Status post thoracentesis   Heart failure with preserved ejection fraction (HCC)  Acute on chronic respiratory failure with hypoxia due to acute on chronic CHF  possible pneumonia, bilateral small pleural effusion- as seen on chest CT Not on oxygen supplementation at baseline Currently on 3L with O2 saturation in upper 90s. - wean as tolerated - f/u sputum culture results- NGTD -Completed course of azithromycin. - Continue Ancef as written for his osteomyelitis  - incentive spirometer q1hr - wean steroids  Acute on chronic diastolic CHF- improving. Down about 5.2kg since 6/16. Echo 6/17: LVEF of 50-55% with RV systolic function is normal. BNP 673. - judicial use of diuretics and close monitoring of kidney function. Patient is mildly hypervolemic on exam - add on albumin per nephro - TED stocking - Continue strict I's and O's and daily weight.   Pleural effusion- IR unable to perform thoracentesis 6/20, 6/21. Medical management. Clinically improving respiratory status.  - periodic chest xray to monitor if not improving/worsening   Atrial fibrillation, chronic (HCC): Heart rate now well controlled in 60-70s diltiazem, metoprolol per cardiology Lisinopril held with AKI Restarted eliquis (renally dosed) in setting of no new bleeding and patient's hgb has been stable >5 days after concern for GI bleed early in admission on heparin gtt.  Recent hx of MSSA bacteremia vertebral osteomyelitis (HCC): continue IV Ancef until  7/12. Follow up with ID outpatient 7/11 repeat Blood culture NGTD   CAD  Myocardial injury  HLD:  trop  258,  possibly due to demand ischemia secondary to CHF exacerbation.  Patient does not have chest pain. Crestor, beta blocker, Aspirin 81 discontinued as he's on eliquis  Cardiology following  May need coronary angiography at some point but will defer for now    AKI on Chronic kidney disease, stage 3a [facility]): Cr stable 2.4>2.4 even with use of diuretics. Baseline creatinine 1.5-1.9 Follow BMP Nephrology following, appreciate your recs Considering biopsy if worsens  Normocytic anemia: hgb stable around 8.5-9. S/p 1 unit PRBC 06/18 ABLA w/ rectal bleed   Hypertension Continue to closely monitor vital signs.   Resolved hypokalemia   GERD PPI   Right knee pain - xray of right knee pain, per patient's wife request. No known injury   DVT prophylaxis: eliquis Pertinent IV fluids/nutrition: None Central lines / invasive devices: PICC in R Brachial     Code Status: DNR, see H&P, discuss w/ pt/family if respiratory deterioration requiring intubation, no CPR ACP documentation reviewed: 08/19/22 - HCPOA w/ 1) Dava Najjar, alternates 2) Erling Conte, 3) Sandra Cockayne FIelds.    Current Admission Status: inpatient  TOC needs / Dispo plan: TBD Barriers to discharge / significant pending items: cardiology recs  Status is: Inpatient The patient requires at least 2 midnights for further evaluation and treatment of present condition.  Objective: Vitals:   08/26/22 1616 08/26/22 2037 08/26/22 2353 08/27/22 0439  BP: 127/67 126/65 135/68 (!) 143/76  Pulse: (!) 59 68 63 66  Resp: 15 20 16 20   Temp: 97.7 F (36.5 C) 97.8 F (36.6 C) 97.7 F (36.5 C) 97.8 F (36.6 C)  TempSrc: Oral Oral Oral Oral  SpO2: 94% 94% 92% 94%  Weight:    86.7 kg  Height:        Intake/Output Summary (Last 24 hours) at 08/27/2022 0830 Last data filed at 08/27/2022 4854 Gross per 24 hour  Intake 1061.34 ml  Output 1600 ml  Net -538.66 ml    Filed Weights   08/23/22 0459 08/26/22 0503 08/27/22 0439  Weight:  90 kg 86.2 kg 86.7 kg   Exam: General: 87 y.o. year-old male frail-appearing in no acute distress.  He is alert.  Hard of hearing. Cardiovascular: Irregular rate and rhythm no rubs or gallops. Respiratory: Mild diffuse rales bilaterally.  Poor inspiratory effort. Musculoskeletal: 1+ pitting edema in lower extremities bilaterally. Skin: No ulcerative lesions noted or rashes, Psychiatry: Mood is appropriate for condition and setting  Data Reviewed: CBC: Recent Labs  Lab 08/21/22 0619 08/22/22 0505 08/23/22 0516 08/24/22 0920 08/25/22 0630 08/26/22 0440 08/27/22 0454  WBC 12.0*  11.9* 10.4 12.4* 9.6 10.5 9.2 10.1  NEUTROABS 9.6* 9.3*  --  8.2*  --   --   --   HGB 9.1*  9.1* 8.0* 9.0* 9.0* 8.5* 8.6* 8.3*  HCT 27.9*  27.9* 25.3* 27.7* 27.6* 26.5* 26.8* 26.2*  MCV 93.0  92.7 94.1 93.9 93.6 92.7 93.1 93.2  PLT 284  267 241 302 279 255 251 248    Basic Metabolic Panel: Recent Labs  Lab 08/23/22 0516 08/24/22 0630 08/25/22 0630 08/26/22 0440 08/27/22 0454  NA 140 140 140 140 137  K 3.4* 3.8 3.7 3.5 3.5  CL 103 104 105 103 99  CO2 27 26 25 25 27   GLUCOSE 115* 120* 133* 138* 126*  BUN 64* 66* 73* 81* 89*  CREATININE 2.44* 2.43*  2.46* 2.45* 2.40*  CALCIUM 7.9* 8.0* 8.0* 8.3* 8.4*  MG 2.2 2.5*  --   --   --   PHOS 3.4 3.5  --   --   --     GFR: Estimated Creatinine Clearance: 25.1 mL/min (A) (by C-G formula based on SCr of 2.4 mg/dL (H)). Liver Function Tests: Recent Labs  Lab 08/23/22 0516 08/24/22 0630  AST  --  141*  ALT  --  15  ALKPHOS  --  117  BILITOT  --  0.6  PROT  --  6.1*  ALBUMIN 2.1* 2.0*    Cardiac Enzymes: Recent Labs  Lab 08/23/22 1950  CKTOTAL 31*     Scheduled Meds:  apixaban  2.5 mg Oral BID   aspirin EC  81 mg Oral QHS   Chlorhexidine Gluconate Cloth  6 each Topical Daily   cyanocobalamin  1,000 mcg Oral Daily   diltiazem  360 mg Oral Daily   feeding supplement  237 mL Oral BID BM   furosemide  60 mg Intravenous Q12H    methylPREDNISolone  8 mg Oral Daily   And   methylPREDNISolone  4 mg Oral Daily   And   methylPREDNISolone  8 mg Oral QHS   [START ON 08/28/2022] methylPREDNISolone  4 mg Oral Daily   And   [START ON 08/29/2022] methylPREDNISolone  4 mg Oral Daily   metoprolol succinate  100 mg Oral Daily   multivitamins with iron  1 tablet Oral Daily   pantoprazole  40 mg Oral Daily   polyethylene glycol  17 g Oral Daily   rosuvastatin  40 mg Oral Daily   senna  1 tablet Oral Daily    Continuous Infusions:  albumin human Stopped (08/26/22 2357)   ceFAZolin Stopped (08/26/22 2248)     LOS: 9 days    Leeroy Bock, MD [facility] Pager [phone]  If 7PM-7AM, please contact night-coverage [email or web address] Password [email or web address] 08/27/2022, 8:30 AM

## 2022-08-28 DIAGNOSIS — I5033 Acute on chronic diastolic (congestive) heart failure: Secondary | ICD-10-CM | POA: Diagnosis not present

## 2022-08-28 DIAGNOSIS — J9601 Acute respiratory failure with hypoxia: Secondary | ICD-10-CM | POA: Diagnosis not present

## 2022-08-28 LAB — CBC
HCT: 27.1 % — ABNORMAL LOW (ref 39.0–52.0)
Hemoglobin: 8.8 g/dL — ABNORMAL LOW (ref 13.0–17.0)
MCH: 29.7 pg (ref 26.0–34.0)
MCHC: 32.5 g/dL (ref 30.0–36.0)
MCV: 91.6 fL (ref 80.0–100.0)
Platelets: 264 10*3/uL (ref 150–400)
RBC: 2.96 MIL/uL — ABNORMAL LOW (ref 4.22–5.81)
RDW: 13.9 % (ref 11.5–15.5)
WBC: 19.1 10*3/uL — ABNORMAL HIGH (ref 4.0–10.5)
nRBC: 0 % (ref 0.0–0.2)

## 2022-08-28 LAB — MULTIPLE MYELOMA PANEL, SERUM
Albumin SerPl Elph-Mcnc: 2 g/dL — ABNORMAL LOW (ref 2.9–4.4)
Albumin/Glob SerPl: 0.6 — ABNORMAL LOW (ref 0.7–1.7)
Alpha 1: 0.4 g/dL (ref 0.0–0.4)
Alpha2 Glob SerPl Elph-Mcnc: 1 g/dL (ref 0.4–1.0)
B-Globulin SerPl Elph-Mcnc: 0.8 g/dL (ref 0.7–1.3)
Gamma Glob SerPl Elph-Mcnc: 1.2 g/dL (ref 0.4–1.8)
Globulin, Total: 3.4 g/dL (ref 2.2–3.9)
IgA: 521 mg/dL — ABNORMAL HIGH (ref 61–437)
IgG (Immunoglobin G), Serum: 1150 mg/dL (ref 603–1613)
IgM (Immunoglobulin M), Srm: 35 mg/dL (ref 15–143)
M Protein SerPl Elph-Mcnc: 0.3 g/dL — ABNORMAL HIGH
Total Protein ELP: 5.4 g/dL — ABNORMAL LOW (ref 6.0–8.5)

## 2022-08-28 LAB — FUNGITELL BETA-D-GLUCAN
Fungitell Value:: <31.25 pg/mL
Result Name:: NEGATIVE

## 2022-08-28 LAB — BASIC METABOLIC PANEL WITH GFR
Anion gap: 10 (ref 5–15)
BUN: 92 mg/dL — ABNORMAL HIGH (ref 8–23)
CO2: 28 mmol/L (ref 22–32)
Calcium: 8.1 mg/dL — ABNORMAL LOW (ref 8.9–10.3)
Chloride: 96 mmol/L — ABNORMAL LOW (ref 98–111)
Creatinine, Ser: 2.18 mg/dL — ABNORMAL HIGH (ref 0.61–1.24)
GFR, Estimated: 29 mL/min — ABNORMAL LOW
Glucose, Bld: 125 mg/dL — ABNORMAL HIGH (ref 70–99)
Potassium: 3.6 mmol/L (ref 3.5–5.1)
Sodium: 134 mmol/L — ABNORMAL LOW (ref 135–145)

## 2022-08-28 MED ORDER — FUROSEMIDE 40 MG PO TABS
40.0000 mg | ORAL_TABLET | Freq: Two times a day (BID) | ORAL | Status: DC
  Administered 2022-08-28 – 2022-08-29 (×3): 40 mg via ORAL
  Filled 2022-08-28 (×3): qty 1

## 2022-08-28 NOTE — Progress Notes (Signed)
Rounding Note    Patient Name: Brett Riley Date of Encounter: 08/28/2022  [facility] Cardiologist: Lorine Bears, MD   Subjective   Patient seen on a.m. rounds.  Denies any chest pain or worsening shortness of breath.  Wife remains at the bedside.  Does endorse right knee pain.  X-rays done yesterday reveals osteoarthritis and small effusion.  They are concerned about lack of physical therapy during his hospital stay.  -1.2 L output in the last 24 hours.  Inpatient Medications    Scheduled Meds:  apixaban  2.5 mg Oral BID   Chlorhexidine Gluconate Cloth  6 each Topical Daily   cyanocobalamin  1,000 mcg Oral Daily   diltiazem  360 mg Oral Daily   feeding supplement  237 mL Oral BID BM   furosemide  60 mg Intravenous Q12H   methylPREDNISolone  4 mg Oral Daily   And   methylPREDNISolone  8 mg Oral QHS   methylPREDNISolone  4 mg Oral Daily   And   [START ON 08/29/2022] methylPREDNISolone  4 mg Oral Daily   metoprolol succinate  100 mg Oral Daily   multivitamins with iron  1 tablet Oral Daily   pantoprazole  40 mg Oral Daily   polyethylene glycol  17 g Oral Daily   rosuvastatin  40 mg Oral Daily   senna  1 tablet Oral Daily   Continuous Infusions:  ceFAZolin 2 g (08/27/22 2202)   PRN Meds: acetaminophen, albuterol, dextromethorphan-guaiFENesin, melatonin, ondansetron (ZOFRAN) IV, simethicone, sodium chloride, traMADol   Vital Signs    Vitals:   08/28/22 0200 08/28/22 0300 08/28/22 0400 08/28/22 0434  BP:   (!) 152/72   Pulse: 63 65 (!) 56   Resp: 19 16 16    Temp:   97.7 F (36.5 C)   TempSrc:   Oral   SpO2: 90% 90% 92%   Weight:    84 kg  Height:        Intake/Output Summary (Last 24 hours) at 08/28/2022 0942 Last data filed at 08/28/2022 0547 Gross per 24 hour  Intake 932.36 ml  Output 2200 ml  Net -1267.64 ml      08/28/2022    4:34 AM 08/27/2022    4:39 AM 08/26/2022    5:03 AM  Last 3 Weights  Weight (lbs) 185 lb 3 oz 191 lb 2.2 oz 190  lb 0.6 oz  Weight (kg) 84 kg 86.7 kg 86.2 kg      Telemetry    Sinus rhythm heart rate in the 60s- Personally Reviewed  ECG    No new tracings- Personally Reviewed  Physical Exam   GEN: No acute distress.   Neck: No JVD Cardiac: RRR, no murmurs, rubs, or gallops.  Respiratory: Diminished to auscultation bilaterally.  Unlabored.  Related to the nasal cannula GI: Soft, nontender, non-distended  MS: 1-2+ edema; No deformity. Neuro:  Nonfocal  Psych: Normal affect   Labs    High Sensitivity Troponin:   Recent Labs  Lab 08/18/22 1020 08/18/22 1805 08/18/22 2033 08/18/22 2303  TROPONINIHS 258* 1,775* 1,726* 1,446*     Chemistry Recent Labs  Lab 08/23/22 0516 08/24/22 0630 08/25/22 0630 08/26/22 0440 08/27/22 0454  NA 140 140 140 140 137  K 3.4* 3.8 3.7 3.5 3.5  CL 103 104 105 103 99  CO2 27 26 25 25 27   GLUCOSE 115* 120* 133* 138* 126*  BUN 64* 66* 73* 81* 89*  CREATININE 2.44* 2.43* 2.46* 2.45* 2.40*  CALCIUM 7.9*  8.0* 8.0* 8.3* 8.4*  MG 2.2 2.5*  --   --   --   PROT  --  6.1*  --   --   --   ALBUMIN 2.1* 2.0*  --   --   --   AST  --  141*  --   --   --   ALT  --  15  --   --   --   ALKPHOS  --  117  --   --   --   BILITOT  --  0.6  --   --   --   GFRNONAA 25* 26* 25* 25* 26*  ANIONGAP 10 10 10 12 11     Lipids No results for input(s): "CHOL", "TRIG", "HDL", "LABVLDL", "LDLCALC", "CHOLHDL" in the last 168 hours.  Hematology Recent Labs  Lab 08/26/22 0440 08/27/22 0454 08/28/22 0415  WBC 9.2 10.1 19.1*  RBC 2.88* 2.81* 2.96*  HGB 8.6* 8.3* 8.8*  HCT 26.8* 26.2* 27.1*  MCV 93.1 93.2 91.6  MCH 29.9 29.5 29.7  MCHC 32.1 31.7 32.5  RDW 13.8 13.8 13.9  PLT 251 248 264   Thyroid No results for input(s): "TSH", "FREET4" in the last 168 hours.  BNP Recent Labs  Lab 08/21/22 1655  BNP 673.3*    DDimer No results for input(s): "DDIMER" in the last 168 hours.   Radiology    DG Knee 1-2 Views Right  Result Date: 08/27/2022 CLINICAL DATA:  Right  knee pain. EXAM: RIGHT KNEE - 1-2 VIEW COMPARISON:  Right knee radiographs 07/31/2022 FINDINGS: Minimal medial compartment joint space narrowing. Small joint effusion, minimally decreased from prior. Minimal superior patellar degenerative spurring. No acute fracture or dislocation. Moderate atherosclerotic calcifications. IMPRESSION: 1. Minimal medial compartment osteoarthritis. 2. Small joint effusion. Electronically Signed   By: Neita Garnet M.D.   On: 08/27/2022 15:25    Cardiac Studies   Limited echo 08/19/2022: 1. Left ventricular ejection fraction, by estimation, is 50 to 55%. The  left ventricle has low normal function. Left ventricular endocardial  border not optimally defined to evaluate regional wall motion.   2. Right ventricular systolic function is normal.  __________   TEE 08/05/2022: 1. Left ventricular ejection fraction, by estimation, is 55 to 60%. The  left ventricle has normal function.   2. Right ventricular systolic function is normal. The right ventricular  size is normal.   3. No left atrial/left atrial appendage thrombus was detected.   4. The mitral valve is normal in structure. Mild mitral valve  regurgitation.   5. The aortic valve is tricuspid. Aortic valve regurgitation is trivial.   Patient Profile     87 y.o. male with past medical history of coronary artery disease, carotid artery disease status post left carotid endarterectomy, PAD, hypertension, hyperlipidemia, endovascular AAA for penetrating aortic ulcer, atrial fibrillation on apixaban, CKD 3A, HFpEF, has been seen and evaluated for HFpEF exacerbation and elevated high-sensitivity troponin.  Assessment & Plan    Acute respiratory failure -Initially treated with IV Lasix held for worsening renal function has been restarted by nephrology -Amiodarone stopped due to concern of pneumonitis -Bilateral pleural effusions with thoracentesis assessment patient developed left lower provide procedure pending  completed -We attempted again on 6/21 with trivial fluid on the left.  Small fluid pocket on the right -CT of the chest revealed interstitial opacities concerning for atypical infection -Continued management per IM  Acute on chronic HFpEF -Limited echocardiogram revealed an LVEF of 50-55% -Diuretics being managed by  nephrology -Continued with bilateral lower extremity edema -Not a candidate for SGLT2 inhibitor with AKI -Daily weights, I's and O's, low-sodium diet  NSTEMI versus demand ischemia -Demand/supply mismatch in the setting of pneumonitis/hypoxia, fever, infection, and anemia -High-sensitivity troponin peaked at 1775 -Continues to remain chest pain-free -No ischemic changes noted on telemetry -IV heparin stopped for rectal bleeding -No plans for ischemic workup given rectal bleeding -Continued on apixaban and lieu of aspirin, beta-blocker, and statin therapy  Symptomatic anemia -Hemoglobin 8.8 -Recommend keeping hemoglobin greater than 8 -Daily CBC  AKI on CKD 3 -Serum creatinine 2.18 -Baseline serum creatinine 1.15 -Daily BMP -Monitor urine output -Monitor/trend/replete electrolytes as needed -Diuretics managed by nephrology  Paroxysmal atrial fibrillation -Currently maintaining sinus rhythm -Continued on apixaban 2.5 mg twice daily for stroke prophylaxis -Continued on diltiazem 360 mg daily -Continue with telemetry monitoring  Essential hypertension -Blood pressure 152/72 -Continue current medication regimen -Vital signs per unit protocol  Hyperlipidemia -LDL 34 -Continued on rosuvastatin 40 mg daily  MSSA bacteremia -Continued management per IM     For questions or updates, please contact [facility] Please consult [email or web address] for contact info under        Signed, Marionna Gonia, NP  08/28/2022, 9:42 AM

## 2022-08-28 NOTE — TOC Progression Note (Signed)
Transition of Care [facility]) - Progression Note    Patient Details  Name: Brett Riley MRN: [id number] Date of Birth: [birth year]  Transition of Care [facility]) CM/SW Contact  Margarito Liner, LCSW Phone Number: 08/28/2022, 9:29 AM  Clinical Narrative: No bed offers this morning. There are several that have not reviewed referral yet.    Expected Discharge Plan: Skilled Nursing Facility Barriers to Discharge: Continued Medical Work up, English as a second language teacher  Expected Discharge Plan and Services     Post Acute Care Choice: Skilled Nursing Facility Living arrangements for the past 2 months: Single Family Home                                       Social Determinants of Health (SDOH) Interventions SDOH Screenings   Food Insecurity: No Food Insecurity (08/18/2022)  Housing: Low Risk  (08/18/2022)  Transportation Needs: No Transportation Needs (08/18/2022)  Utilities: Not At Risk (08/18/2022)  Depression (PHQ2-9): Low Risk  (06/06/2022)  Financial Resource Strain: Low Risk  (08/29/2020)  Physical Activity: Insufficiently Active (08/29/2020)  Social Connections: Unknown (08/29/2020)  Stress: No Stress Concern Present (08/29/2020)  Tobacco Use: Low Risk  (08/18/2022)    Readmission Risk Interventions     No data to display

## 2022-08-28 NOTE — Progress Notes (Signed)
Physical Therapy Treatment Patient Details Name: Brett Riley MRN: [id number] DOB: 15-Jun-1935 Today's Date: 08/28/2022   History of Present Illness Brett Riley is a 87 y/o M admitted with respiratory failure; other PMH includes MI, CHF, a-fib, HTN, anemia. Was at SNF doing rehab prior to admission; was previously admitted 5/18-6/7.    PT Comments    Patient supine in bed with wife in room upon arrival. BP checked at rest 116/61, patient denies any symptoms. Performed quad sets x 5, heel slides x 5, SLR x 5 (AAROM on RLE), and ankle pumps x 15. Spo2 desaturated to 88 during there-ex, instructed patient on pursed lip breathing, Spo2 returned to 92. Pt. Stustanted on 2L of O2. Attempted to sit EOB with mod assist and HOB elevated, patient rated fatigue 8/10 on RPE scale  Session ended due to fatigue and patient left in bed with call bell and alarm set. Communicated with nursing that pulse oximeter was detached upon entering the room .    Recommendations for follow up therapy are one component of a multi-disciplinary discharge planning process, led by the attending physician.  Recommendations may be updated based on patient status, additional functional criteria and insurance authorization.  Follow Up Recommendations  Can patient physically be transported by private vehicle: No    Assistance Recommended at Discharge Frequent or constant Supervision/Assistance  Patient can return home with the following Two people to help with walking and/or transfers;Two people to help with bathing/dressing/bathroom;Assistance with cooking/housework;Assist for transportation;Help with stairs or ramp for entrance   Equipment Recommendations  None recommended by PT    Recommendations for Other Services       Precautions / Restrictions Precautions Precautions: Fall Restrictions Weight Bearing Restrictions: No     Mobility  Bed Mobility Overal bed mobility: Needs Assistance Bed Mobility: Supine to  Sit     Supine to sit: HOB elevated, Mod assist     General bed mobility comments: attempted supine<> sit EOB, made it 3/4 of the way there before patient became too fatigued too continue.    Transfers                   General transfer comment: unable to assess due to fatigue    Ambulation/Gait               General Gait Details: unable to assess due to fatigue   Stairs             Wheelchair Mobility    Modified Rankin (Stroke Patients Only)       Balance   Sitting-balance support: Feet supported, Bilateral upper extremity supported Sitting balance-Leahy Scale: Poor Sitting balance - Comments: fatigues quickly                                    Cognition Arousal/Alertness: Awake/alert Behavior During Therapy: Flat affect, WFL for tasks assessed/performed Overall Cognitive Status: Within Functional Limits for tasks assessed                                 General Comments: [facility]        Exercises General Exercises - Lower Extremity Ankle Circles/Pumps: AROM, Both, 15 reps, Supine Quad Sets: AROM, Both, 5 reps, Supine Heel Slides: AROM, Both, 5 reps, Supine Straight Leg Raises: AROM, Both, 5 reps, Supine    General Comments  Pertinent Vitals/Pain Pain Assessment Pain Assessment: No/denies pain    Home Living                          Prior Function            PT Goals (current goals can now be found in the care plan section) Acute Rehab PT Goals Patient Stated Goal: get stronger PT Goal Formulation: With patient/family Time For Goal Achievement: 09/10/22 Potential to Achieve Goals: Fair Progress towards PT goals: Progressing toward goals    Frequency    Min 2X/week      PT Plan Current plan remains appropriate;Discharge plan needs to be updated;Frequency needs to be updated    Co-evaluation              AM-PAC PT "6 Clicks" Mobility   Outcome Measure  Help  needed turning from your back to your side while in a flat bed without using bedrails?: A Lot Help needed moving from lying on your back to sitting on the side of a flat bed without using bedrails?: A Lot Help needed moving to and from a bed to a chair (including a wheelchair)?: Total Help needed standing up from a chair using your arms (e.g., wheelchair or bedside chair)?: Total Help needed to walk in hospital room?: Total Help needed climbing 3-5 steps with a railing? : Total 6 Click Score: 8    End of Session Equipment Utilized During Treatment: Oxygen Activity Tolerance: Patient limited by fatigue Patient left: in bed;with call bell/phone within reach;with bed alarm set Nurse Communication: Mobility status;Other (comment) (Patient O2 disconnected.) PT Visit Diagnosis: Muscle weakness (generalized) (M62.81);Difficulty in walking, not elsewhere classified (R26.2);Unsteadiness on feet (R26.81);Other abnormalities of gait and mobility (R26.89)     Time: 7846-9629 PT Time Calculation (min) (ACUTE ONLY): 16 min  Charges:                        Malachi Carl, SPT   Malachi Carl 08/28/2022, 2:55 PM

## 2022-08-28 NOTE — Progress Notes (Signed)
[facility]  ROUNDING NOTE   Subjective:   Patient seen sitting up in bed Wife at bedside  Creatinine 2.18 (2.40) UOP 2.9L  Objective:  Vital signs in last 24 hours:  Temp:  [97.6 F (36.4 C)-97.9 F (36.6 C)] 97.7 F (36.5 C) (06/26 0400) Pulse Rate:  [56-71] 56 (06/26 0400) Resp:  [13-23] 16 (06/26 0400) BP: (132-152)/(63-73) 152/72 (06/26 0400) SpO2:  [90 %-94 %] 92 % (06/26 0400) Weight:  [84 kg] 84 kg (06/26 0434)  Weight change: -2.7 kg Filed Weights   08/26/22 0503 08/27/22 0439 08/28/22 0434  Weight: 86.2 kg 86.7 kg 84 kg    Intake/Output: I/O last 3 completed shifts: In: 1993.7 [P.O.:970; IV Piggyback:1023.7] Out: 4100 [Urine:4100]   Intake/Output this shift:  Total I/O In: -  Out: 800 [Urine:800]  Physical Exam: General: NAD, laying in bed  Head: Normocephalic, atraumatic. Moist oral mucosal membranes  Eyes: Anicteric  Lungs:  Diminished, 2L [place]  Heart: Regular rate and rhythm  Abdomen:  Soft, nontender  Extremities:  + peripheral edema.  Neurologic: Nonfocal, moving all four extremities  Skin: No lesions  Access: none    Basic Metabolic Panel: Recent Labs  Lab 08/23/22 0516 08/24/22 0630 08/25/22 0630 08/26/22 0440 08/27/22 0454 08/28/22 1205  NA 140 140 140 140 137 134*  K 3.4* 3.8 3.7 3.5 3.5 3.6  CL 103 104 105 103 99 96*  CO2 27 26 25 25 27 28   GLUCOSE 115* 120* 133* 138* 126* 125*  BUN 64* 66* 73* 81* 89* 92*  CREATININE 2.44* 2.43* 2.46* 2.45* 2.40* 2.18*  CALCIUM 7.9* 8.0* 8.0* 8.3* 8.4* 8.1*  MG 2.2 2.5*  --   --   --   --   PHOS 3.4 3.5  --   --   --   --      Liver Function Tests: Recent Labs  Lab 08/23/22 0516 08/24/22 0630  AST  --  141*  ALT  --  15  ALKPHOS  --  117  BILITOT  --  0.6  PROT  --  6.1*  ALBUMIN 2.1* 2.0*    No results for input(s): "LIPASE", "AMYLASE" in the last 168 hours. No results for input(s): "AMMONIA" in the last 168 hours.  CBC: Recent Labs  Lab 08/22/22 0505  08/23/22 0516 08/24/22 0920 08/25/22 0630 08/26/22 0440 08/27/22 0454 08/28/22 0415  WBC 10.4   < > 9.6 10.5 9.2 10.1 19.1*  NEUTROABS 9.3*  --  8.2*  --   --   --   --   HGB 8.0*   < > 9.0* 8.5* 8.6* 8.3* 8.8*  HCT 25.3*   < > 27.6* 26.5* 26.8* 26.2* 27.1*  MCV 94.1   < > 93.6 92.7 93.1 93.2 91.6  PLT 241   < > 279 255 251 248 264   < > = values in this interval not displayed.     Cardiac Enzymes: Recent Labs  Lab 08/23/22 1950  CKTOTAL 31*     BNP: Invalid input(s): "POCBNP"  CBG: No results for input(s): "GLUCAP" in the last 168 hours.   Microbiology: Results for orders placed or performed during the hospital encounter of 08/18/22  Blood culture (routine x 2)     Status: None   Collection Time: 08/18/22 10:45 AM   Specimen: BLOOD LEFT ARM  Result Value Ref Range Status   Specimen Description BLOOD LEFT ARM  Final   Special Requests   Final    BOTTLES DRAWN  AEROBIC AND ANAEROBIC Blood Culture adequate volume   Culture   Final    NO GROWTH 5 DAYS Performed at [facility], [address] [place], [place] [address]    Report Status 08/23/2022 FINAL  Final  Resp Panel by RT-PCR (Flu A&B, Covid) Anterior Nasal Swab     Status: None   Collection Time: 08/18/22 10:46 AM   Specimen: Anterior Nasal Swab  Result Value Ref Range Status   SARS Coronavirus 2 by RT PCR NEGATIVE NEGATIVE Final    Comment: (NOTE) SARS-CoV-2 target nucleic acids are NOT DETECTED.  The SARS-CoV-2 RNA is generally detectable in upper respiratory specimens during the acute phase of infection. The lowest concentration of SARS-CoV-2 viral copies this assay can detect is 138 copies/mL. A negative result does not preclude SARS-Cov-2 infection and should not be used as the sole basis for treatment or other patient management decisions. A negative result may occur with  improper specimen collection/handling, submission of specimen other than nasopharyngeal swab, presence of viral  mutation(s) within the areas targeted by this assay, and inadequate number of viral copies(<138 copies/mL). A negative result must be combined with clinical observations, patient history, and epidemiological information. The expected result is Negative.  Fact Sheet for Patients:  BloggerCourse.com  Fact Sheet for Healthcare Providers:  SeriousBroker.it  This test is no t yet approved or cleared by the [place] FDA and  has been authorized for detection and/or diagnosis of SARS-CoV-2 by FDA under an Emergency Use Authorization (EUA). This EUA will remain  in effect (meaning this test can be used) for the duration of the COVID-19 declaration under Section 564(b)(1) of the Act, 21 U.S.C.section 360bbb-3(b)(1), unless the authorization is terminated  or revoked sooner.       Influenza A by PCR NEGATIVE NEGATIVE Final   Influenza B by PCR NEGATIVE NEGATIVE Final    Comment: (NOTE) The Xpert Xpress SARS-CoV-2/FLU/RSV plus assay is intended as an aid in the diagnosis of influenza from Nasopharyngeal swab specimens and should not be used as a sole basis for treatment. Nasal washings and aspirates are unacceptable for Xpert Xpress SARS-CoV-2/FLU/RSV testing.  Fact Sheet for Patients: BloggerCourse.com  Fact Sheet for Healthcare Providers: SeriousBroker.it  This test is not yet approved or cleared by the [place] FDA and has been authorized for detection and/or diagnosis of SARS-CoV-2 by FDA under an Emergency Use Authorization (EUA). This EUA will remain in effect (meaning this test can be used) for the duration of the COVID-19 declaration under Section 564(b)(1) of the Act, 21 U.S.C. section 360bbb-3(b)(1), unless the authorization is terminated or revoked.  Performed at [facility], [address] [place], [place] [address]   Culture, blood (Routine X 2) w  Reflex to ID Panel     Status: None   Collection Time: 08/18/22  9:19 PM   Specimen: BLOOD LEFT ARM  Result Value Ref Range Status   Specimen Description BLOOD LEFT ARM  Final   Special Requests   Final    BOTTLES DRAWN AEROBIC AND ANAEROBIC Blood Culture adequate volume   Culture   Final    NO GROWTH 5 DAYS Performed at [facility], [address] [place], [place] [address]    Report Status 08/23/2022 FINAL  Final  Respiratory (~20 pathogens) panel by PCR     Status: None   Collection Time: 08/21/22  1:50 PM   Specimen: Nasopharyngeal Swab; Respiratory  Result Value Ref Range Status   Adenovirus NOT DETECTED NOT  DETECTED Final   Coronavirus 229E NOT DETECTED NOT DETECTED Final    Comment: (NOTE) The Coronavirus on the Respiratory Panel, DOES NOT test for the novel  Coronavirus (2019 nCoV)    Coronavirus HKU1 NOT DETECTED NOT DETECTED Final   Coronavirus NL63 NOT DETECTED NOT DETECTED Final   Coronavirus OC43 NOT DETECTED NOT DETECTED Final   Metapneumovirus NOT DETECTED NOT DETECTED Final   Rhinovirus / Enterovirus NOT DETECTED NOT DETECTED Final   Influenza A NOT DETECTED NOT DETECTED Final   Influenza B NOT DETECTED NOT DETECTED Final   Parainfluenza Virus 1 NOT DETECTED NOT DETECTED Final   Parainfluenza Virus 2 NOT DETECTED NOT DETECTED Final   Parainfluenza Virus 3 NOT DETECTED NOT DETECTED Final   Parainfluenza Virus 4 NOT DETECTED NOT DETECTED Final   Respiratory Syncytial Virus NOT DETECTED NOT DETECTED Final   Bordetella pertussis NOT DETECTED NOT DETECTED Final   Bordetella Parapertussis NOT DETECTED NOT DETECTED Final   Chlamydophila pneumoniae NOT DETECTED NOT DETECTED Final   Mycoplasma pneumoniae NOT DETECTED NOT DETECTED Final    Comment: Performed at [facility], [address] [place], [place] [address]  Culture, blood (Routine X 2) w Reflex to ID Panel     Status: None   Collection Time: 08/21/22  8:58 PM   Specimen: BLOOD  Result Value  Ref Range Status   Specimen Description BLOOD LEFT WRIST  Final   Special Requests   Final    BOTTLES DRAWN AEROBIC AND ANAEROBIC Blood Culture adequate volume   Culture   Final    NO GROWTH 5 DAYS Performed at [facility], [address] [place], [place] [address]    Report Status 08/26/2022 FINAL  Final  Culture, blood (Routine X 2) w Reflex to ID Panel     Status: None   Collection Time: 08/21/22 11:01 PM   Specimen: BLOOD  Result Value Ref Range Status   Specimen Description BLOOD BLOOD LEFT ARM  Final   Special Requests   Final    BOTTLES DRAWN AEROBIC AND ANAEROBIC Blood Culture adequate volume   Culture   Final    NO GROWTH 5 DAYS Performed at [facility], [address], [place], [place] [address]    Report Status 08/26/2022 FINAL  Final  Expectorated Sputum Assessment w Gram Stain, Rflx to Resp Cult     Status: None   Collection Time: 08/22/22 11:22 AM   Specimen: Sputum  Result Value Ref Range Status   Specimen Description SPUTUM  Final   Special Requests EXPSU  Final   Sputum evaluation   Final    THIS SPECIMEN IS ACCEPTABLE FOR SPUTUM CULTURE Performed at [facility], [address], [place], [place] [address]    Report Status 08/22/2022 FINAL  Final  Culture, Respiratory w Gram Stain     Status: None   Collection Time: 08/22/22 11:22 AM   Specimen: SPU  Result Value Ref Range Status   Specimen Description   Final    SPUTUM Performed at [facility], [address], [place], [place] [address]    Special Requests   Final    EXPSU Reflexed from [id number] Performed at [facility], [address] [place], [place] [address]    Gram Stain   Final    ABUNDANT SQUAMOUS EPITHELIAL CELLS PRESENT FEW WBC PRESENT, PREDOMINANTLY PMN ABUNDANT GRAM VARIABLE ROD ABUNDANT GRAM POSITIVE COCCI IN PAIRS    Culture   Final  Normal respiratory flora-no Staph aureus or Pseudomonas seen Performed at [facility], [address], [place], [place] [address]    Report Status 08/25/2022 FINAL  Final    Coagulation Studies: No results for input(s): "LABPROT", "INR" in the last 72 hours.  Urinalysis: No results for input(s): "COLORURINE", "LABSPEC", "PHURINE", "GLUCOSEU", "HGBUR", "BILIRUBINUR", "KETONESUR", "PROTEINUR", "UROBILINOGEN", "NITRITE", "LEUKOCYTESUR" in the last 72 hours.  Invalid input(s): "APPERANCEUR"     Imaging: DG Knee 1-2 Views Right  Result Date: 08/27/2022 CLINICAL DATA:  Right knee pain. EXAM: RIGHT KNEE - 1-2 VIEW COMPARISON:  Right knee radiographs 07/31/2022 FINDINGS: Minimal medial compartment joint space narrowing. Small joint effusion, minimally decreased from prior. Minimal superior patellar degenerative spurring. No acute fracture or dislocation. Moderate atherosclerotic calcifications. IMPRESSION: 1. Minimal medial compartment osteoarthritis. 2. Small joint effusion. Electronically Signed   By: Neita Garnet M.D.   On: 08/27/2022 15:25     Medications:    ceFAZolin 2 g (08/28/22 1202)    apixaban  2.5 mg Oral BID   Chlorhexidine Gluconate Cloth  6 each Topical Daily   cyanocobalamin  1,000 mcg Oral Daily   diltiazem  360 mg Oral Daily   feeding supplement  237 mL Oral BID BM   furosemide  40 mg Oral BID   methylPREDNISolone  4 mg Oral Daily   And   methylPREDNISolone  8 mg Oral QHS   methylPREDNISolone  4 mg Oral Daily   And   [START ON 08/29/2022] methylPREDNISolone  4 mg Oral Daily   metoprolol succinate  100 mg Oral Daily   multivitamins with iron  1 tablet Oral Daily   pantoprazole  40 mg Oral Daily   polyethylene glycol  17 g Oral Daily   rosuvastatin  40 mg Oral Daily   senna  1 tablet Oral Daily   acetaminophen, albuterol, dextromethorphan-guaiFENesin, melatonin, ondansetron (ZOFRAN) IV, simethicone, sodium chloride, traMADol  Assessment/ Plan:  [name] is a 87 y.o.  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  Acute kidney injury with proteinuria and hematuria: Baseline creatinine of 1.15 with normal GFR on 5/29. Serologic work up pending. Serum complements normal.  ATN and prerenal azotemia from overdiuresis. No obstruction on renal ultrasound. No recent IV contrast exposure. Nonoliguric urine output. No acute indication for dialysis.  - holding lisinopril - Creatinine improved - Serologies negative - Will transition to oral diuresis    2.  Acute on chronic diastolic heart failure.  Ejection fraction 55 to 60% with grade 1 diastolic dysfunction noted.  -Will transition to oral furosemide 40mg  BID -Completed Albumin - appreciate cardiology input.   3.  Anemia with acute kidney injury: hemoglobin 8.3. PRBC transfusion on 6/18. Hgb stable    LOS: 10   6/26/20241:25 PM

## 2022-08-28 NOTE — Progress Notes (Signed)
PROGRESS NOTE    FIELDS OROS  MWU:[id number] DOB: Jul 04, 1935 DOA: 08/18/2022 PCP: Dale Ozark, MD    Brief Narrative:  87 y.o. male with medical history significant of chronic dCHF, HTN, HLD, CAD, CKD-3a, anemia, chronic A fib on Eliquis, who presents 08/18/2022 to ED from rehab, chief complaint SOB.  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 Net IO Since Admission: -5,549.14 mL [08/20/22 1608]. Heparin stopped d/t rectal bleeding overnight.  06/19: euvolemic per cardio, d/c lasix, O2 requirement still high, CT chest reviewed, ?edema/atypical infection, given clinically worsening respiratory status have asked pulmonary and ID to consult. Have d/c amiodarone and ordered steroids in case amio toxicity.  06/20: ID and pulmonary assisting with the management.   6/22-6/25: patient feels clinically improved. Stable with oxygen via [place] and weaned down to 2-3L. Patient requiring judicial adjustments to balance between hypervolemia and renal injury with diuresis. Unable to pull fluid from lungs with thoracentesis so medical management is best option. His IV Abx to treat his vertebral osteomyelitis from previous admission continue unchanged and managed by ID.    Assessment & Plan:   Principal Problem:   Acute respiratory failure with hypoxia (HCC) Active Problems:   Acute on chronic heart failure with preserved ejection fraction (HFpEF) (HCC)   MSSA bacteremia   Vertebral osteomyelitis (HCC)   CAD (coronary artery disease)   Myocardial injury   Hypertension   Hyperlipidemia    Chronic kidney disease, stage 3a (HCC)   Normocytic anemia   Atrial fibrillation, chronic (HCC)   Paroxysmal atrial fibrillation (HCC)   HCAP (healthcare-associated pneumonia)   Sepsis (HCC)   Pleural effusion   Amiodarone pulmonary toxicity   Acute on chronic HFrEF (heart failure with reduced ejection fraction) (HCC)   Pleural effusion on right   Status post thoracentesis   Heart failure with preserved ejection fraction (HCC)   Stage 3 chronic kidney disease (HCC)  Acute on chronic respiratory failure with hypoxia due to acute on chronic CHF  possible pneumonia, bilateral small pleural effusion- as seen on chest CT Not on oxygen supplementation at baseline Currently on 3L with O2 saturation in upper 90s. Plan: - wean as tolerated - f/u sputum culture results- NGTD -Completed course of azithromycin. - Continue Ancef as written for his osteomyelitis.  Last dose 7/12 - incentive spirometer q1hr - wean steroids   Acute on chronic diastolic CHF- improving. Down about 5.2kg since 6/16. Echo 6/17: LVEF of 50-55% with RV systolic function is normal. BNP 673. Plan: Oral Lasix TED hose Strict I's and O's, daily weights   Pleural effusion- IR unable to perform thoracentesis 6/20, 6/21. Medical management. Clinically improving respiratory status.     Atrial fibrillation, chronic (HCC): Heart rate now well controlled in 60-70s diltiazem, metoprolol per cardiology Lisinopril held with AKI Restarted eliquis (renally dosed) in setting of no new bleeding and patient's hgb has been stable >5 days after concern for GI bleed early in admission on heparin gtt.   Recent hx of MSSA bacteremia vertebral osteomyelitis (HCC): continue IV Ancef until 7/12. Follow up with ID outpatient 7/11 repeat Blood culture NGTD  CAD  Myocardial injury  HLD:  trop  258, possibly due to demand ischemia secondary to CHF exacerbation.  Patient does not have chest pain. Crestor, beta blocker, Aspirin 81  discontinued as he's on eliquis  Cardiology following  May need coronary angiography at some point but will defer for now     AKI on Chronic kidney disease, stage 3a [facility]): Cr stable 2.4>2.4 even with use of diuretics. Baseline creatinine 1.5-1.9 Follow BMP Nephrology following    Normocytic anemia: hgb stable around 8.5-9. S/p 1 unit PRBC 06/18 ABLA w/ rectal bleed   Hypertension Continue to closely monitor vital signs.   Resolved hypokalemia   GERD PPI    Right knee pain -X-ray with chronic arthritic changes.  Recommend frequent mobility   DVT prophylaxis: Eliquis Code Status: DNR Family Communication: Spouse at bedside 6/26 Disposition Plan: Status is: Inpatient Remains inpatient appropriate because: Pending placement.  Unsafe discharge plan   Level of care: Stepdown  Consultants:  Cardiology Nephrology  Procedures:  None  Antimicrobials: Ancef   Subjective: Seen and examined.  Sitting in the morning.  Wife at bedside.  Patient endorses knee pain  Objective: Vitals:   08/28/22 0200 08/28/22 0300 08/28/22 0400 08/28/22 0434  BP:   (!) 152/72   Pulse: 63 65 (!) 56   Resp: 19 16 16    Temp:   97.7 F (36.5 C)   TempSrc:   Oral   SpO2: 90% 90% 92%   Weight:    84 kg  Height:        Intake/Output Summary (Last 24 hours) at 08/28/2022 1503 Last data filed at 08/28/2022 1000 Gross per 24 hour  Intake 442.36 ml  Output 2645 ml  Net -2202.64 ml   Filed Weights   08/26/22 0503 08/27/22 0439 08/28/22 0434  Weight: 86.2 kg 86.7 kg 84 kg    Examination:  General exam: NAD.  Appears chronically ill Respiratory system: Scattered crackles bilaterally, worse on right, normal work of breathing from 3 L Cardiovascular system: S1-S2, RRR, no murmurs, no pedal edema Gastrointestinal system: Soft, NT/ND, normal bowel sounds Central nervous system: Alert and oriented. No focal neurological deficits. Extremities: Right lower extremity decreased ROM Skin: No  rashes, lesions or ulcers Psychiatry: Judgement and insight appear normal. Mood & affect flattened.     Data Reviewed: I have personally reviewed following labs and imaging studies  CBC: Recent Labs  Lab 08/22/22 0505 08/23/22 0516 08/24/22 0920 08/25/22 0630 08/26/22 0440 08/27/22 0454 08/28/22 0415  WBC 10.4   < > 9.6 10.5 9.2 10.1 19.1*  NEUTROABS 9.3*  --  8.2*  --   --   --   --   HGB 8.0*   < > 9.0* 8.5* 8.6* 8.3* 8.8*  HCT 25.3*   < > 27.6* 26.5* 26.8* 26.2* 27.1*  MCV 94.1   < > 93.6 92.7 93.1 93.2 91.6  PLT 241   < > 279 255 251 248 264   < > = values in this interval not displayed.   Basic Metabolic Panel: Recent Labs  Lab 08/23/22 0516 08/24/22 0630 08/25/22 0630 08/26/22 0440 08/27/22 0454 08/28/22 1205  NA 140 140 140 140 137 134*  K 3.4* 3.8 3.7 3.5 3.5 3.6  CL 103 104 105 103 99 96*  CO2 27 26 25 25 27 28   GLUCOSE 115* 120* 133* 138* 126* 125*  BUN 64* 66* 73* 81* 89* 92*  CREATININE 2.44* 2.43* 2.46* 2.45* 2.40* 2.18*  CALCIUM 7.9* 8.0* 8.0*  8.3* 8.4* 8.1*  MG 2.2 2.5*  --   --   --   --   PHOS 3.4 3.5  --   --   --   --    GFR: Estimated Creatinine Clearance: 27.7 mL/min (A) (by C-G formula based on SCr of 2.18 mg/dL (H)). Liver Function Tests: Recent Labs  Lab 08/23/22 0516 08/24/22 0630  AST  --  141*  ALT  --  15  ALKPHOS  --  117  BILITOT  --  0.6  PROT  --  6.1*  ALBUMIN 2.1* 2.0*   No results for input(s): "LIPASE", "AMYLASE" in the last 168 hours. No results for input(s): "AMMONIA" in the last 168 hours. Coagulation Profile: No results for input(s): "INR", "PROTIME" in the last 168 hours. Cardiac Enzymes: Recent Labs  Lab 08/23/22 1950  CKTOTAL 31*   BNP (last 3 results) No results for input(s): "PROBNP" in the last 8760 hours. HbA1C: No results for input(s): "HGBA1C" in the last 72 hours. CBG: No results for input(s): "GLUCAP" in the last 168 hours. Lipid Profile: No results for input(s): "CHOL", "HDL", "LDLCALC",  "TRIG", "CHOLHDL", "LDLDIRECT" in the last 72 hours. Thyroid Function Tests: No results for input(s): "TSH", "T4TOTAL", "FREET4", "T3FREE", "THYROIDAB" in the last 72 hours. Anemia Panel: No results for input(s): "VITAMINB12", "FOLATE", "FERRITIN", "TIBC", "IRON", "RETICCTPCT" in the last 72 hours. Sepsis Labs: Recent Labs  Lab 08/21/22 1655 08/21/22 1912 08/23/22 0516  PROCALCITON 0.28  --  0.19  LATICACIDVEN 1.6 1.7  --     Recent Results (from the past 240 hour(s))  Culture, blood (Routine X 2) w Reflex to ID Panel     Status: None   Collection Time: 08/18/22  9:19 PM   Specimen: BLOOD LEFT ARM  Result Value Ref Range Status   Specimen Description BLOOD LEFT ARM  Final   Special Requests   Final    BOTTLES DRAWN AEROBIC AND ANAEROBIC Blood Culture adequate volume   Culture   Final    NO GROWTH 5 DAYS Performed at [facility], [address] [place], [place] [address]    Report Status 08/23/2022 FINAL  Final  Respiratory (~20 pathogens) panel by PCR     Status: None   Collection Time: 08/21/22  1:50 PM   Specimen: Nasopharyngeal Swab; Respiratory  Result Value Ref Range Status   Adenovirus NOT DETECTED NOT DETECTED Final   Coronavirus 229E NOT DETECTED NOT DETECTED Final    Comment: (NOTE) The Coronavirus on the Respiratory Panel, DOES NOT test for the novel  Coronavirus (2019 nCoV)    Coronavirus HKU1 NOT DETECTED NOT DETECTED Final   Coronavirus NL63 NOT DETECTED NOT DETECTED Final   Coronavirus OC43 NOT DETECTED NOT DETECTED Final   Metapneumovirus NOT DETECTED NOT DETECTED Final   Rhinovirus / Enterovirus NOT DETECTED NOT DETECTED Final   Influenza A NOT DETECTED NOT DETECTED Final   Influenza B NOT DETECTED NOT DETECTED Final   Parainfluenza Virus 1 NOT DETECTED NOT DETECTED Final   Parainfluenza Virus 2 NOT DETECTED NOT DETECTED Final   Parainfluenza Virus 3 NOT DETECTED NOT DETECTED Final   Parainfluenza Virus 4 NOT DETECTED NOT DETECTED Final    Respiratory Syncytial Virus NOT DETECTED NOT DETECTED Final   Bordetella pertussis NOT DETECTED NOT DETECTED Final   Bordetella Parapertussis NOT DETECTED NOT DETECTED Final   Chlamydophila pneumoniae NOT DETECTED NOT DETECTED Final   Mycoplasma pneumoniae NOT DETECTED NOT DETECTED Final    Comment: Performed at [facility]  [facility], [address], [place], [place] [address]  Culture, blood (Routine X 2) w Reflex to ID Panel     Status: None   Collection Time: 08/21/22  8:58 PM   Specimen: BLOOD  Result Value Ref Range Status   Specimen Description BLOOD LEFT WRIST  Final   Special Requests   Final    BOTTLES DRAWN AEROBIC AND ANAEROBIC Blood Culture adequate volume   Culture   Final    NO GROWTH 5 DAYS Performed at [facility], [address] [place], [place] [address]    Report Status 08/26/2022 FINAL  Final  Culture, blood (Routine X 2) w Reflex to ID Panel     Status: None   Collection Time: 08/21/22 11:01 PM   Specimen: BLOOD  Result Value Ref Range Status   Specimen Description BLOOD BLOOD LEFT ARM  Final   Special Requests   Final    BOTTLES DRAWN AEROBIC AND ANAEROBIC Blood Culture adequate volume   Culture   Final    NO GROWTH 5 DAYS Performed at [facility], [address] [place], [place] [address]    Report Status 08/26/2022 FINAL  Final  Expectorated Sputum Assessment w Gram Stain, Rflx to Resp Cult     Status: None   Collection Time: 08/22/22 11:22 AM   Specimen: Sputum  Result Value Ref Range Status   Specimen Description SPUTUM  Final   Special Requests EXPSU  Final   Sputum evaluation   Final    THIS SPECIMEN IS ACCEPTABLE FOR SPUTUM CULTURE Performed at [facility], [address], [place], [place] [address]    Report Status 08/22/2022 FINAL  Final  Culture, Respiratory w Gram Stain     Status: None   Collection Time: 08/22/22 11:22 AM   Specimen: SPU  Result Value Ref Range Status   Specimen Description   Final     SPUTUM Performed at [facility], [address], [place], [place] [address]    Special Requests   Final    EXPSU Reflexed from [id number] Performed at [facility], [address] [place], [place] [address]    Gram Stain   Final    ABUNDANT SQUAMOUS EPITHELIAL CELLS PRESENT FEW WBC PRESENT, PREDOMINANTLY PMN ABUNDANT GRAM VARIABLE ROD ABUNDANT GRAM POSITIVE COCCI IN PAIRS    Culture   Final    Normal respiratory flora-no Staph aureus or Pseudomonas seen Performed at [facility], [address], [place], [place] [address]    Report Status 08/25/2022 FINAL  Final         Radiology Studies: DG Knee 1-2 Views Right  Result Date: 08/27/2022 CLINICAL DATA:  Right knee pain. EXAM: RIGHT KNEE - 1-2 VIEW COMPARISON:  Right knee radiographs 07/31/2022 FINDINGS: Minimal medial compartment joint space narrowing. Small joint effusion, minimally decreased from prior. Minimal superior patellar degenerative spurring. No acute fracture or dislocation. Moderate atherosclerotic calcifications. IMPRESSION: 1. Minimal medial compartment osteoarthritis. 2. Small joint effusion. Electronically Signed   By: Neita Garnet M.D.   On: 08/27/2022 15:25        Scheduled Meds:  apixaban  2.5 mg Oral BID   Chlorhexidine Gluconate Cloth  6 each Topical Daily   cyanocobalamin  1,000 mcg Oral Daily   diltiazem  360 mg Oral Daily   feeding supplement  237 mL Oral BID BM   furosemide  40 mg Oral BID   methylPREDNISolone  4 mg Oral Daily   And  methylPREDNISolone  8 mg Oral QHS   methylPREDNISolone  4 mg Oral Daily   And   [START ON 08/29/2022] methylPREDNISolone  4 mg Oral Daily   metoprolol succinate  100 mg Oral Daily   multivitamins with iron  1 tablet Oral Daily   pantoprazole  40 mg Oral Daily   polyethylene glycol  17 g Oral Daily   rosuvastatin  40 mg Oral Daily   senna  1 tablet Oral Daily   Continuous Infusions:  ceFAZolin 2 g (08/28/22 1202)     LOS: 10 days      Tresa Moore, MD [facility]   If 7PM-7AM, please contact night-coverage  08/28/2022, 3:03 PM

## 2022-08-29 ENCOUNTER — Other Ambulatory Visit: Payer: Self-pay | Admitting: Internal Medicine

## 2022-08-29 DIAGNOSIS — J9601 Acute respiratory failure with hypoxia: Secondary | ICD-10-CM | POA: Diagnosis not present

## 2022-08-29 LAB — CBC
HCT: 26.6 % — ABNORMAL LOW (ref 39.0–52.0)
Hemoglobin: 8.7 g/dL — ABNORMAL LOW (ref 13.0–17.0)
MCH: 30.4 pg (ref 26.0–34.0)
MCHC: 32.7 g/dL (ref 30.0–36.0)
MCV: 93 fL (ref 80.0–100.0)
Platelets: 253 10*3/uL (ref 150–400)
RBC: 2.86 MIL/uL — ABNORMAL LOW (ref 4.22–5.81)
RDW: 14 % (ref 11.5–15.5)
WBC: 24.6 10*3/uL — ABNORMAL HIGH (ref 4.0–10.5)
nRBC: 0 % (ref 0.0–0.2)

## 2022-08-29 MED ORDER — METHYLPREDNISOLONE 4 MG PO TABS
4.0000 mg | ORAL_TABLET | Freq: Every day | ORAL | Status: AC
  Administered 2022-08-29 – 2022-08-31 (×3): 4 mg via ORAL
  Filled 2022-08-29 (×3): qty 1

## 2022-08-29 NOTE — Progress Notes (Signed)
[facility]  ROUNDING NOTE   Subjective:   Patient resting quietly No family at bedside  Remains on 2L Pillow Lower extremity edema remains  Creatinine 2.18 (2.40) UOP 2L  Objective:  Vital signs in last 24 hours:  Temp:  [97.4 F (36.3 C)-98 F (36.7 C)] 97.6 F (36.4 C) (06/27 1258) Pulse Rate:  [59-73] 61 (06/27 1258) Resp:  [12-20] 20 (06/27 1258) BP: (103-148)/(58-78) 106/58 (06/27 1258) SpO2:  [95 %-99 %] 99 % (06/27 1258)  Weight change:  Filed Weights   08/26/22 0503 08/27/22 0439 08/28/22 0434  Weight: 86.2 kg 86.7 kg 84 kg    Intake/Output: I/O last 3 completed shifts: In: 480 [P.O.:480] Out: 3450 [Urine:3450]   Intake/Output this shift:  Total I/O In: 480 [P.O.:480] Out: 500 [Urine:500]  Physical Exam: General: NAD, laying in bed  Head: Normocephalic, atraumatic. Moist oral mucosal membranes  Eyes: Anicteric  Lungs:  Diminished, 2L   Heart: Regular rate and rhythm  Abdomen:  Soft, nontender  Extremities:  ++ peripheral edema.  Neurologic: Nonfocal, moving all four extremities  Skin: No lesions  Access: none    Basic Metabolic Panel: Recent Labs  Lab 08/23/22 0516 08/24/22 0630 08/25/22 0630 08/26/22 0440 08/27/22 0454 08/28/22 1205  NA 140 140 140 140 137 134*  K 3.4* 3.8 3.7 3.5 3.5 3.6  CL 103 104 105 103 99 96*  CO2 27 26 25 25 27 28   GLUCOSE 115* 120* 133* 138* 126* 125*  BUN 64* 66* 73* 81* 89* 92*  CREATININE 2.44* 2.43* 2.46* 2.45* 2.40* 2.18*  CALCIUM 7.9* 8.0* 8.0* 8.3* 8.4* 8.1*  MG 2.2 2.5*  --   --   --   --   PHOS 3.4 3.5  --   --   --   --      Liver Function Tests: Recent Labs  Lab 08/23/22 0516 08/24/22 0630  AST  --  141*  ALT  --  15  ALKPHOS  --  117  BILITOT  --  0.6  PROT  --  6.1*  ALBUMIN 2.1* 2.0*    No results for input(s): "LIPASE", "AMYLASE" in the last 168 hours. No results for input(s): "AMMONIA" in the last 168 hours.  CBC: Recent Labs  Lab 08/24/22 0920 08/25/22 0630  08/26/22 0440 08/27/22 0454 08/28/22 0415 08/29/22 0500  WBC 9.6 10.5 9.2 10.1 19.1* 24.6*  NEUTROABS 8.2*  --   --   --   --   --   HGB 9.0* 8.5* 8.6* 8.3* 8.8* 8.7*  HCT 27.6* 26.5* 26.8* 26.2* 27.1* 26.6*  MCV 93.6 92.7 93.1 93.2 91.6 93.0  PLT 279 255 251 248 264 253     Cardiac Enzymes: Recent Labs  Lab 08/23/22 1950  CKTOTAL 31*     BNP: Invalid input(s): "POCBNP"  CBG: No results for input(s): "GLUCAP" in the last 168 hours.   Microbiology: Results for orders placed or performed during the hospital encounter of 08/18/22  Blood culture (routine x 2)     Status: None   Collection Time: 08/18/22 10:45 AM   Specimen: BLOOD LEFT ARM  Result Value Ref Range Status   Specimen Description BLOOD LEFT ARM  Final   Special Requests   Final    BOTTLES DRAWN AEROBIC AND ANAEROBIC Blood Culture adequate volume   Culture   Final    NO GROWTH 5 DAYS Performed at [facility], [address] [place], [place] [address]    Report Status 08/23/2022  FINAL  Final  Resp Panel by RT-PCR (Flu A&B, Covid) Anterior Nasal Swab     Status: None   Collection Time: 08/18/22 10:46 AM   Specimen: Anterior Nasal Swab  Result Value Ref Range Status   SARS Coronavirus 2 by RT PCR NEGATIVE NEGATIVE Final    Comment: (NOTE) SARS-CoV-2 target nucleic acids are NOT DETECTED.  The SARS-CoV-2 RNA is generally detectable in upper respiratory specimens during the acute phase of infection. The lowest concentration of SARS-CoV-2 viral copies this assay can detect is 138 copies/mL. A negative result does not preclude SARS-Cov-2 infection and should not be used as the sole basis for treatment or other patient management decisions. A negative result may occur with  improper specimen collection/handling, submission of specimen other than nasopharyngeal swab, presence of viral mutation(s) within the areas targeted by this assay, and inadequate number of viral copies(<138 copies/mL). A  negative result must be combined with clinical observations, patient history, and epidemiological information. The expected result is Negative.  Fact Sheet for Patients:  BloggerCourse.com  Fact Sheet for Healthcare Providers:  SeriousBroker.it  This test is no t yet approved or cleared by the [place] FDA and  has been authorized for detection and/or diagnosis of SARS-CoV-2 by FDA under an Emergency Use Authorization (EUA). This EUA will remain  in effect (meaning this test can be used) for the duration of the COVID-19 declaration under Section 564(b)(1) of the Act, 21 U.S.C.section 360bbb-3(b)(1), unless the authorization is terminated  or revoked sooner.       Influenza A by PCR NEGATIVE NEGATIVE Final   Influenza B by PCR NEGATIVE NEGATIVE Final    Comment: (NOTE) The Xpert Xpress SARS-CoV-2/FLU/RSV plus assay is intended as an aid in the diagnosis of influenza from Nasopharyngeal swab specimens and should not be used as a sole basis for treatment. Nasal washings and aspirates are unacceptable for Xpert Xpress SARS-CoV-2/FLU/RSV testing.  Fact Sheet for Patients: BloggerCourse.com  Fact Sheet for Healthcare Providers: SeriousBroker.it  This test is not yet approved or cleared by the [place] FDA and has been authorized for detection and/or diagnosis of SARS-CoV-2 by FDA under an Emergency Use Authorization (EUA). This EUA will remain in effect (meaning this test can be used) for the duration of the COVID-19 declaration under Section 564(b)(1) of the Act, 21 U.S.C. section 360bbb-3(b)(1), unless the authorization is terminated or revoked.  Performed at [facility], [address] [place], [place] [address]   Culture, blood (Routine X 2) w Reflex to ID Panel     Status: None   Collection Time: 08/18/22  9:19 PM   Specimen: BLOOD LEFT ARM  Result  Value Ref Range Status   Specimen Description BLOOD LEFT ARM  Final   Special Requests   Final    BOTTLES DRAWN AEROBIC AND ANAEROBIC Blood Culture adequate volume   Culture   Final    NO GROWTH 5 DAYS Performed at [facility], [address] [place], [place] [address]    Report Status 08/23/2022 FINAL  Final  Respiratory (~20 pathogens) panel by PCR     Status: None   Collection Time: 08/21/22  1:50 PM   Specimen: Nasopharyngeal Swab; Respiratory  Result Value Ref Range Status   Adenovirus NOT DETECTED NOT DETECTED Final   Coronavirus 229E NOT DETECTED NOT DETECTED Final    Comment: (NOTE) The Coronavirus on the Respiratory Panel, DOES NOT test for the novel  Coronavirus (2019 nCoV)    Coronavirus HKU1 NOT  DETECTED NOT DETECTED Final   Coronavirus NL63 NOT DETECTED NOT DETECTED Final   Coronavirus OC43 NOT DETECTED NOT DETECTED Final   Metapneumovirus NOT DETECTED NOT DETECTED Final   Rhinovirus / Enterovirus NOT DETECTED NOT DETECTED Final   Influenza A NOT DETECTED NOT DETECTED Final   Influenza B NOT DETECTED NOT DETECTED Final   Parainfluenza Virus 1 NOT DETECTED NOT DETECTED Final   Parainfluenza Virus 2 NOT DETECTED NOT DETECTED Final   Parainfluenza Virus 3 NOT DETECTED NOT DETECTED Final   Parainfluenza Virus 4 NOT DETECTED NOT DETECTED Final   Respiratory Syncytial Virus NOT DETECTED NOT DETECTED Final   Bordetella pertussis NOT DETECTED NOT DETECTED Final   Bordetella Parapertussis NOT DETECTED NOT DETECTED Final   Chlamydophila pneumoniae NOT DETECTED NOT DETECTED Final   Mycoplasma pneumoniae NOT DETECTED NOT DETECTED Final    Comment: Performed at [facility], [address] [place] [address], [place], [place] [address]  Culture, blood (Routine X 2) w Reflex to ID Panel     Status: None   Collection Time: 08/21/22  8:58 PM   Specimen: BLOOD  Result Value Ref Range Status   Specimen Description BLOOD LEFT WRIST  Final   Special Requests   Final    BOTTLES DRAWN  AEROBIC AND ANAEROBIC Blood Culture adequate volume   Culture   Final    NO GROWTH 5 DAYS Performed at [facility], [address] [place], [place] [address]    Report Status 08/26/2022 FINAL  Final  Culture, blood (Routine X 2) w Reflex to ID Panel     Status: None   Collection Time: 08/21/22 11:01 PM   Specimen: BLOOD  Result Value Ref Range Status   Specimen Description BLOOD BLOOD LEFT ARM  Final   Special Requests   Final    BOTTLES DRAWN AEROBIC AND ANAEROBIC Blood Culture adequate volume   Culture   Final    NO GROWTH 5 DAYS Performed at [facility], [address] [place], [place] [address]    Report Status 08/26/2022 FINAL  Final  Expectorated Sputum Assessment w Gram Stain, Rflx to Resp Cult     Status: None   Collection Time: 08/22/22 11:22 AM   Specimen: Sputum  Result Value Ref Range Status   Specimen Description SPUTUM  Final   Special Requests EXPSU  Final   Sputum evaluation   Final    THIS SPECIMEN IS ACCEPTABLE FOR SPUTUM CULTURE Performed at [facility], [address], [place], [place] [address]    Report Status 08/22/2022 FINAL  Final  Culture, Respiratory w Gram Stain     Status: None   Collection Time: 08/22/22 11:22 AM   Specimen: SPU  Result Value Ref Range Status   Specimen Description   Final    SPUTUM Performed at [facility], [address] [place], [place] [address]    Special Requests   Final    EXPSU Reflexed from [id number] Performed at [facility], [address] [place], [place] [address]    Gram Stain   Final    ABUNDANT SQUAMOUS EPITHELIAL CELLS PRESENT FEW WBC PRESENT, PREDOMINANTLY PMN ABUNDANT GRAM VARIABLE ROD ABUNDANT GRAM POSITIVE COCCI IN PAIRS    Culture   Final    Normal respiratory flora-no Staph aureus or Pseudomonas seen Performed at [facility], [address], [place], [place] [address]    Report Status 08/25/2022 FINAL  Final    Coagulation  Studies: No results  for input(s): "LABPROT", "INR" in the last 72 hours.  Urinalysis: No results for input(s): "COLORURINE", "LABSPEC", "PHURINE", "GLUCOSEU", "HGBUR", "BILIRUBINUR", "KETONESUR", "PROTEINUR", "UROBILINOGEN", "NITRITE", "LEUKOCYTESUR" in the last 72 hours.  Invalid input(s): "APPERANCEUR"     Imaging: No results found.   Medications:    ceFAZolin 2 g (08/29/22 0836)    apixaban  2.5 mg Oral BID   Chlorhexidine Gluconate Cloth  6 each Topical Daily   cyanocobalamin  1,000 mcg Oral Daily   diltiazem  360 mg Oral Daily   feeding supplement  237 mL Oral BID BM   furosemide  40 mg Oral BID   methylPREDNISolone  4 mg Oral Daily   metoprolol succinate  100 mg Oral Daily   multivitamins with iron  1 tablet Oral Daily   pantoprazole  40 mg Oral Daily   polyethylene glycol  17 g Oral Daily   rosuvastatin  40 mg Oral Daily   senna  1 tablet Oral Daily   acetaminophen, albuterol, dextromethorphan-guaiFENesin, melatonin, ondansetron (ZOFRAN) IV, simethicone, sodium chloride, traMADol  Assessment/ Plan:  [name] is a 87 y.o.  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  Acute kidney injury with proteinuria and hematuria: Baseline creatinine of 1.15 with normal GFR on 5/29. Serologic work up pending. Serum complements normal.  ATN and prerenal azotemia from overdiuresis. No obstruction on renal ultrasound. No recent IV contrast exposure. Nonoliguric urine output. No acute indication for dialysis.  -  holding lisinopril - No updated labs today, will evaluate with am labs - Continue oral diuresis.    2.  Acute on chronic diastolic heart failure.  Ejection fraction 55 to 60% with grade 1 diastolic dysfunction noted.  -Continue furosemide 40mg  BID -Completed Albumin - appreciate cardiology input.   3.  Anemia with acute kidney injury: PRBC transfusion on 6/18.     LOS: 11   6/27/20243:00 PM

## 2022-08-29 NOTE — Progress Notes (Signed)
PROGRESS NOTE    Brett Riley  GHW:[id number] DOB: April 12, 1935 DOA: 08/18/2022 PCP: Dale Wanchese, MD    Brief Narrative:  87 y.o. male with medical history significant of chronic dCHF, HTN, HLD, CAD, CKD-3a, anemia, chronic A fib on Eliquis, who presents 08/18/2022 to ED from rehab, chief complaint SOB.  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 Net IO Since Admission: -5,549.14 mL [08/20/22 1608]. Heparin stopped d/t rectal bleeding overnight.  06/19: euvolemic per cardio, d/c lasix, O2 requirement still high, CT chest reviewed, ?edema/atypical infection, given clinically worsening respiratory status have asked pulmonary and ID to consult. Have d/c amiodarone and ordered steroids in case amio toxicity.  06/20: ID and pulmonary assisting with the management.   6/22-6/25: patient feels clinically improved. Stable with oxygen via [place] and weaned down to 2-3L. Patient requiring judicial adjustments to balance between hypervolemia and renal injury with diuresis. Unable to pull fluid from lungs with thoracentesis so medical management is best option. His IV Abx to treat his vertebral osteomyelitis from previous admission continue unchanged and managed by ID.    Assessment & Plan:   Principal Problem:   Acute respiratory failure with hypoxia (HCC) Active Problems:   Acute on chronic heart failure with preserved ejection fraction (HFpEF) (HCC)   MSSA bacteremia   Vertebral osteomyelitis (HCC)   CAD (coronary artery disease)   Myocardial injury   Hypertension   Hyperlipidemia    Chronic kidney disease, stage 3a (HCC)   Normocytic anemia   Atrial fibrillation, chronic (HCC)   Paroxysmal atrial fibrillation (HCC)   HCAP (healthcare-associated pneumonia)   Sepsis (HCC)   Pleural effusion   Amiodarone pulmonary toxicity   Acute on chronic HFrEF (heart failure with reduced ejection fraction) (HCC)   Pleural effusion on right   Status post thoracentesis   Heart failure with preserved ejection fraction (HCC)   Stage 3 chronic kidney disease (HCC)  Acute on chronic respiratory failure with hypoxia due to acute on chronic CHF  possible pneumonia, bilateral small pleural effusion- as seen on chest CT Not on oxygen supplementation at baseline Currently on 3L with O2 saturation in upper 90s. Plan: - wean as tolerated - f/u sputum culture results- NGTD -Completed course of azithromycin. - Continue Ancef as written for his osteomyelitis.  Last dose 7/12 - incentive spirometer q1hr - wean steroids -Patient remains medically stable for discharge   Acute on chronic diastolic CHF- improving. Down about 5.2kg since 6/16. Echo 6/17: LVEF of 50-55% with RV systolic function is normal. BNP 673. Plan: Oral Lasix TED hose Strict I's and O's, daily weights   Pleural effusion- IR unable to perform thoracentesis 6/20, 6/21. Medical management. Clinically improving respiratory status.     Atrial fibrillation, chronic (HCC): Heart rate now well controlled in 60-70s diltiazem, metoprolol per cardiology Lisinopril held with AKI Restarted eliquis (renally dosed) in setting of no new bleeding and patient's hgb has been stable >5 days after concern for GI bleed early in admission on heparin gtt.   Recent hx of MSSA bacteremia vertebral osteomyelitis (HCC): continue IV Ancef until 7/12. Follow up with ID outpatient  7/11 repeat Blood culture NGTD   CAD  Myocardial injury  HLD:  trop  258, possibly due to demand ischemia secondary to CHF exacerbation.  Patient does not have chest  pain. Crestor, beta blocker, Aspirin 81 discontinued as he's on eliquis  Cardiology following  May need coronary angiography at some point but will defer for now     AKI on Chronic kidney disease, stage 3a [facility]): Cr stable 2.4>2.4 even with use of diuretics. Baseline creatinine 1.5-1.9 Follow BMP Nephrology following    Normocytic anemia: hgb stable around 8.5-9. S/p 1 unit PRBC 06/18 ABLA w/ rectal bleed   Hypertension Continue to closely monitor vital signs.   Resolved hypokalemia   GERD PPI    Right knee pain -X-ray with chronic arthritic changes.  Recommend frequent mobility   DVT prophylaxis: Eliquis Code Status: DNR Family Communication: Spouse at bedside 6/26, 6/27 Disposition Plan: Status is: Inpatient Remains inpatient appropriate because: Pending placement.  Unsafe discharge plan   Level of care: Stepdown  Consultants:  Cardiology Nephrology  Procedures:  None  Antimicrobials: Ancef   Subjective: Seen and examined.  Sitting in the morning.  Wife at bedside.  Patient appears sleepy but more comfortable this morning  Objective: Vitals:   08/29/22 0100 08/29/22 0400 08/29/22 0832 08/29/22 0935  BP:  133/69 (!) 145/78   Pulse:  (!) 59 73   Resp: 14 14    Temp:  (!) 97.4 F (36.3 C)  98 F (36.7 C)  TempSrc:  Oral    SpO2:   97%   Weight:      Height:        Intake/Output Summary (Last 24 hours) at 08/29/2022 1217 Last data filed at 08/29/2022 1005 Gross per 24 hour  Intake 480 ml  Output 1250 ml  Net -770 ml   Filed Weights   08/26/22 0503 08/27/22 0439 08/28/22 0434  Weight: 86.2 kg 86.7 kg 84 kg    Examination:  General exam: No acute distress.  Lethargic.  Chronically ill-appearing Respiratory system: Right-sided crackles.  Normal work of breathing.  3 L Cardiovascular system: S1-S2, RRR, no murmurs, no pedal edema Gastrointestinal system: Soft, NT/ND, normal bowel sounds Central nervous system: Alert and oriented. No focal  neurological deficits. Extremities: Right lower extremity decreased ROM Skin: No rashes, lesions or ulcers Psychiatry: Judgement and insight appear normal. Mood & affect flattened.     Data Reviewed: I have personally reviewed following labs and imaging studies  CBC: Recent Labs  Lab 08/24/22 0920 08/25/22 0630 08/26/22 0440 08/27/22 0454 08/28/22 0415 08/29/22 0500  WBC 9.6 10.5 9.2 10.1 19.1* 24.6*  NEUTROABS 8.2*  --   --   --   --   --   HGB 9.0* 8.5* 8.6* 8.3* 8.8* 8.7*  HCT 27.6* 26.5* 26.8* 26.2* 27.1* 26.6*  MCV 93.6 92.7 93.1 93.2 91.6 93.0  PLT 279 255 251 248 264 253   Basic Metabolic Panel: Recent Labs  Lab 08/23/22 0516 08/24/22 0630 08/25/22 0630 08/26/22 0440 08/27/22 0454 08/28/22 1205  NA 140 140 140 140 137 134*  K 3.4* 3.8 3.7 3.5 3.5 3.6  CL 103 104 105 103 99 96*  CO2 27 26 25 25 27 28   GLUCOSE 115* 120* 133* 138* 126* 125*  BUN 64* 66* 73* 81* 89* 92*  CREATININE 2.44* 2.43* 2.46* 2.45* 2.40* 2.18*  CALCIUM 7.9* 8.0* 8.0* 8.3* 8.4* 8.1*  MG 2.2 2.5*  --   --   --   --  PHOS 3.4 3.5  --   --   --   --    GFR: Estimated Creatinine Clearance: 27.7 mL/min (A) (by C-G formula based on SCr of 2.18 mg/dL (H)). Liver Function Tests: Recent Labs  Lab 08/23/22 0516 08/24/22 0630  AST  --  141*  ALT  --  15  ALKPHOS  --  117  BILITOT  --  0.6  PROT  --  6.1*  ALBUMIN 2.1* 2.0*   No results for input(s): "LIPASE", "AMYLASE" in the last 168 hours. No results for input(s): "AMMONIA" in the last 168 hours. Coagulation Profile: No results for input(s): "INR", "PROTIME" in the last 168 hours. Cardiac Enzymes: Recent Labs  Lab 08/23/22 1950  CKTOTAL 31*   BNP (last 3 results) No results for input(s): "PROBNP" in the last 8760 hours. HbA1C: No results for input(s): "HGBA1C" in the last 72 hours. CBG: No results for input(s): "GLUCAP" in the last 168 hours. Lipid Profile: No results for input(s): "CHOL", "HDL", "LDLCALC", "TRIG",  "CHOLHDL", "LDLDIRECT" in the last 72 hours. Thyroid Function Tests: No results for input(s): "TSH", "T4TOTAL", "FREET4", "T3FREE", "THYROIDAB" in the last 72 hours. Anemia Panel: No results for input(s): "VITAMINB12", "FOLATE", "FERRITIN", "TIBC", "IRON", "RETICCTPCT" in the last 72 hours. Sepsis Labs: Recent Labs  Lab 08/23/22 0516  PROCALCITON 0.19    Recent Results (from the past 240 hour(s))  Respiratory (~20 pathogens) panel by PCR     Status: None   Collection Time: 08/21/22  1:50 PM   Specimen: Nasopharyngeal Swab; Respiratory  Result Value Ref Range Status   Adenovirus NOT DETECTED NOT DETECTED Final   Coronavirus 229E NOT DETECTED NOT DETECTED Final    Comment: (NOTE) The Coronavirus on the Respiratory Panel, DOES NOT test for the novel  Coronavirus (2019 nCoV)    Coronavirus HKU1 NOT DETECTED NOT DETECTED Final   Coronavirus NL63 NOT DETECTED NOT DETECTED Final   Coronavirus OC43 NOT DETECTED NOT DETECTED Final   Metapneumovirus NOT DETECTED NOT DETECTED Final   Rhinovirus / Enterovirus NOT DETECTED NOT DETECTED Final   Influenza A NOT DETECTED NOT DETECTED Final   Influenza B NOT DETECTED NOT DETECTED Final   Parainfluenza Virus 1 NOT DETECTED NOT DETECTED Final   Parainfluenza Virus 2 NOT DETECTED NOT DETECTED Final   Parainfluenza Virus 3 NOT DETECTED NOT DETECTED Final   Parainfluenza Virus 4 NOT DETECTED NOT DETECTED Final   Respiratory Syncytial Virus NOT DETECTED NOT DETECTED Final   Bordetella pertussis NOT DETECTED NOT DETECTED Final   Bordetella Parapertussis NOT DETECTED NOT DETECTED Final   Chlamydophila pneumoniae NOT DETECTED NOT DETECTED Final   Mycoplasma pneumoniae NOT DETECTED NOT DETECTED Final    Comment: Performed at [facility], [address] [place], [place] [address]  Culture, blood (Routine X 2) w Reflex to ID Panel     Status: None   Collection Time: 08/21/22  8:58 PM   Specimen: BLOOD  Result Value Ref Range Status   Specimen  Description BLOOD LEFT WRIST  Final   Special Requests   Final    BOTTLES DRAWN AEROBIC AND ANAEROBIC Blood Culture adequate volume   Culture   Final    NO GROWTH 5 DAYS Performed at [facility], [address], [place], [place] [address]    Report Status 08/26/2022 FINAL  Final  Culture, blood (Routine X 2) w Reflex to ID Panel     Status: None   Collection Time: 08/21/22 11:01 PM   Specimen:  BLOOD  Result Value Ref Range Status   Specimen Description BLOOD BLOOD LEFT ARM  Final   Special Requests   Final    BOTTLES DRAWN AEROBIC AND ANAEROBIC Blood Culture adequate volume   Culture   Final    NO GROWTH 5 DAYS Performed at [facility], [address] [place], [place] [address]    Report Status 08/26/2022 FINAL  Final  Expectorated Sputum Assessment w Gram Stain, Rflx to Resp Cult     Status: None   Collection Time: 08/22/22 11:22 AM   Specimen: Sputum  Result Value Ref Range Status   Specimen Description SPUTUM  Final   Special Requests EXPSU  Final   Sputum evaluation   Final    THIS SPECIMEN IS ACCEPTABLE FOR SPUTUM CULTURE Performed at [facility], [address], [place], [place] [address]    Report Status 08/22/2022 FINAL  Final  Culture, Respiratory w Gram Stain     Status: None   Collection Time: 08/22/22 11:22 AM   Specimen: SPU  Result Value Ref Range Status   Specimen Description   Final    SPUTUM Performed at [facility], [address], [place], [place] [address]    Special Requests   Final    EXPSU Reflexed from [id number] Performed at [facility], [address] [place], [place] [address]    Gram Stain   Final    ABUNDANT SQUAMOUS EPITHELIAL CELLS PRESENT FEW WBC PRESENT, PREDOMINANTLY PMN ABUNDANT GRAM VARIABLE ROD ABUNDANT GRAM POSITIVE COCCI IN PAIRS    Culture   Final    Normal respiratory flora-no Staph aureus or Pseudomonas seen Performed at [facility], [address],  [place], [place] [address]    Report Status 08/25/2022 FINAL  Final         Radiology Studies: DG Knee 1-2 Views Right  Result Date: 08/27/2022 CLINICAL DATA:  Right knee pain. EXAM: RIGHT KNEE - 1-2 VIEW COMPARISON:  Right knee radiographs 07/31/2022 FINDINGS: Minimal medial compartment joint space narrowing. Small joint effusion, minimally decreased from prior. Minimal superior patellar degenerative spurring. No acute fracture or dislocation. Moderate atherosclerotic calcifications. IMPRESSION: 1. Minimal medial compartment osteoarthritis. 2. Small joint effusion. Electronically Signed   By: Neita Garnet M.D.   On: 08/27/2022 15:25        Scheduled Meds:  apixaban  2.5 mg Oral BID   Chlorhexidine Gluconate Cloth  6 each Topical Daily   cyanocobalamin  1,000 mcg Oral Daily   diltiazem  360 mg Oral Daily   feeding supplement  237 mL Oral BID BM   furosemide  40 mg Oral BID   methylPREDNISolone  4 mg Oral Daily   metoprolol succinate  100 mg Oral Daily   multivitamins with iron  1 tablet Oral Daily   pantoprazole  40 mg Oral Daily   polyethylene glycol  17 g Oral Daily   rosuvastatin  40 mg Oral Daily   senna  1 tablet Oral Daily   Continuous Infusions:  ceFAZolin 2 g (08/29/22 0836)     LOS: 11 days     Tresa Moore, MD [facility]   If 7PM-7AM, please contact night-coverage  08/29/2022, 12:17 PM

## 2022-08-29 NOTE — Care Management Important Message (Signed)
Important Message  Patient Details  Name: Brett Riley MRN: [id number] Date of Birth: 20-Aug-1935   Medicare Important Message Given:  Yes  Patient asleep upon time of visit, no family in room.  Confirmed copy of Medicare IM available in room for reference.    Brett Riley 08/29/2022, 2:37 PM

## 2022-08-29 NOTE — Progress Notes (Signed)
Mobility Specialist - Progress Note   08/29/22 1100  Mobility  Activity Stood at bedside;Dangled on edge of bed  Level of Assistance Moderate assist, patient does 50-74%  Assistive Device Front wheel walker  Distance Ambulated (ft) 0 ft  Range of Motion/Exercises Active;All extremities  Activity Response Tolerated well  $Mobility charge 1 Mobility     Pre-mobility: 63 HR, 97% SpO2 During mobility: 78 HR, 81-91% SpO2 Post-mobility: 64 HR, 94% SpO2   Pt lying in bed performing LE therex with family upon arrival. Utilizing 2L. Pt agreeable to activity. Pt able to complete bed mobility with modA with HOB elevated. O2 desat to low 80s with transfer--pt cued for PLB. O2 bumped up to 3L for OOB activity. Post and L lateral lean in sitting with intermittent assist + use of bed rail to maintain balance. Pt performed 3 STS with min-modA +2 holding upright position ~ 5-10 seconds. Post lean also in standing with physical assist needed for forward weight shifting. Unsafe to attempt ambulation at this time. No complaints. Pt returned to EOB and participated in UE exercises prior to returning supine. Pt left in bed with alarm set, needs in reach. Family at bedside. Pt back on 2L.    Filiberto Pinks Mobility Specialist 08/29/22, 11:41 AM

## 2022-08-29 NOTE — Progress Notes (Signed)
Occupational Therapy Treatment Patient Details Name: Brett Riley MRN: [id number] DOB: [birth year] Today's Date: 08/29/2022   History of present illness Jaymian Bogart is a 87 y/o M admitted with respiratory failure; other PMH includes MI, CHF, a-fib, HTN, anemia. Was at SNF doing rehab prior to admission; was previously admitted 5/18-6/7.   OT comments  Pt seen for OT tx. Pt agreeable. Pt denies SOB or pain. Pt required MOD-MAX A for bed mobility particularly for RLE. Once EOB, pt required SBA to MIN A to correct for intermittent posterior and R lateral LOB. posterior and R lateral LOB requiring MIN A to correct, intermittent CGA to SBA for static sitting balance during grooming EOB with VC for propping R forearm onto R leg to help. Pt required setup for brushing teeth and washing face. Pt endorsing significant fatigue afterwards. Returned to bed. Pt continues to benefit from skilled OT services.    Recommendations for follow up therapy are one component of a multi-disciplinary discharge planning process, led by the attending physician.  Recommendations may be updated based on patient status, additional functional criteria and insurance authorization.    Assistance Recommended at Discharge Frequent or constant Supervision/Assistance  Patient can return home with the following  Two people to help with walking and/or transfers;A lot of help with bathing/dressing/bathroom;Assistance with cooking/housework;Direct supervision/assist for medications management;Direct supervision/assist for financial management;Assist for transportation;Help with stairs or ramp for entrance   Equipment Recommendations  Other (comment) (defer to next venue)    Recommendations for Other Services      Precautions / Restrictions Precautions Precautions: Fall Restrictions Weight Bearing Restrictions: No       Mobility Bed Mobility Overal bed mobility: Needs Assistance Bed Mobility: Supine to Sit, Sit to Supine      Supine to sit: Mod assist, Max assist, HOB elevated Sit to supine: Max assist, Mod assist        Transfers                         Balance Overall balance assessment: Needs assistance Sitting-balance support: Feet supported, Bilateral upper extremity supported, Single extremity supported Sitting balance-Leahy Scale: Poor Sitting balance - Comments: fatigues quickly, requires VC for correcting LOB Postural control: Right lateral lean, Posterior lean                                 ADL either performed or assessed with clinical judgement   ADL Overall ADL's : Needs assistance/impaired     Grooming: Sitting;Set up;Min guard;Minimal assistance;Oral care;Wash/dry face Grooming Details (indicate cue type and reason): posterior and R lateral LOB requiring MIN A to correct, intermittent CGA to SBA for static sitting balance during grooming EOB with VC for propping R forearm onto R leg to help.                                    Extremity/Trunk Assessment              Vision       Perception     Praxis      Cognition Arousal/Alertness: Awake/alert Behavior During Therapy: Flat affect, WFL for tasks assessed/performed Overall Cognitive Status: Within Functional Limits for tasks assessed  General Comments: [facility]        Exercises      Shoulder Instructions       General Comments      Pertinent Vitals/ Pain       Pain Assessment Pain Assessment: No/denies pain  Home Living                                          Prior Functioning/Environment              Frequency  Min 1X/week        Progress Toward Goals  OT Goals(current goals can now be found in the care plan section)  Progress towards OT goals: Progressing toward goals  Acute Rehab OT Goals Patient Stated Goal: get better OT Goal Formulation: With patient Time For Goal Achievement:  09/09/22 Potential to Achieve Goals: Good  Plan Discharge plan remains appropriate;Frequency remains appropriate    Co-evaluation                 AM-PAC OT "6 Clicks" Daily Activity     Outcome Measure   Help from another person eating meals?: None Help from another person taking care of personal grooming?: A Little Help from another person toileting, which includes using toliet, bedpan, or urinal?: Total Help from another person bathing (including washing, rinsing, drying)?: A Lot Help from another person to put on and taking off regular upper body clothing?: A Little Help from another person to put on and taking off regular lower body clothing?: Total 6 Click Score: 14    End of Session    OT Visit Diagnosis: Unsteadiness on feet (R26.81);Muscle weakness (generalized) (M62.81)   Activity Tolerance Patient limited by fatigue   Patient Left in bed;with call bell/phone within reach;with bed alarm set   Nurse Communication          Time: 4742-5956 OT Time Calculation (min): 14 min  Charges: OT General Charges $OT Visit: 1 Visit OT Treatments $Self Care/Home Management : 8-22 mins  Arman Filter., MPH, MS, OTR/L ascom [phone] 08/29/22, 3:46 PM

## 2022-08-29 NOTE — TOC Progression Note (Addendum)
Transition of Care [facility]) - Progression Note    Patient Details  Name: Brett Riley MRN: [id number] Date of Birth: September 19, 1935  Transition of [facility]) CM/SW Contact  Margarito Liner, LCSW Phone Number: 08/29/2022, 10:11 AM  Clinical Narrative:  Received message from [facility] liaison on 6/25 that patient may qualify for their services. CSW met with patient and wife to let them know that no other SNF's in the area can offer a bed. They want to move forward with LTACH referral. They prefer [facility] due to proximity to home but if they cannot offer, they would like to move forward with referral to [facility]. CSW also notified them of [facility]. Sent secure chat to [facility] liaison to notify.  11:56 am: [facility] does not have any beds. [facility] will start insurance authorization. Wife is aware.  Expected Discharge Plan: Skilled Nursing Facility Barriers to Discharge: Continued Medical Work up, English as a second language teacher  Expected Discharge Plan and Services     Post Acute Care Choice: Skilled Nursing Facility Living arrangements for the past 2 months: Single Family Home                                       Social Determinants of Health (SDOH) Interventions SDOH Screenings   Food Insecurity: No Food Insecurity (08/18/2022)  Housing: Low Risk  (08/18/2022)  Transportation Needs: No Transportation Needs (08/18/2022)  Utilities: Not At Risk (08/18/2022)  Depression (PHQ2-9): Low Risk  (06/06/2022)  Financial Resource Strain: Low Risk  (08/29/2020)  Physical Activity: Insufficiently Active (08/29/2020)  Social Connections: Unknown (08/29/2020)  Stress: No Stress Concern Present (08/29/2020)  Tobacco Use: Low Risk  (08/18/2022)    Readmission Risk Interventions     No data to display

## 2022-08-30 DIAGNOSIS — J9601 Acute respiratory failure with hypoxia: Secondary | ICD-10-CM | POA: Diagnosis not present

## 2022-08-30 LAB — BASIC METABOLIC PANEL WITH GFR
Anion gap: 10 (ref 5–15)
BUN: 97 mg/dL — ABNORMAL HIGH (ref 8–23)
CO2: 26 mmol/L (ref 22–32)
Calcium: 8.2 mg/dL — ABNORMAL LOW (ref 8.9–10.3)
Chloride: 97 mmol/L — ABNORMAL LOW (ref 98–111)
Creatinine, Ser: 2.31 mg/dL — ABNORMAL HIGH (ref 0.61–1.24)
GFR, Estimated: 27 mL/min — ABNORMAL LOW
Glucose, Bld: 105 mg/dL — ABNORMAL HIGH (ref 70–99)
Potassium: 3.6 mmol/L (ref 3.5–5.1)
Sodium: 133 mmol/L — ABNORMAL LOW (ref 135–145)

## 2022-08-30 LAB — CBC WITH DIFFERENTIAL/PLATELET
Abs Immature Granulocytes: 0.19 10*3/uL — ABNORMAL HIGH (ref 0.00–0.07)
Basophils Absolute: 0 10*3/uL (ref 0.0–0.1)
Basophils Relative: 0 %
Eosinophils Absolute: 0 10*3/uL (ref 0.0–0.5)
Eosinophils Relative: 0 %
HCT: 25.8 % — ABNORMAL LOW (ref 39.0–52.0)
Hemoglobin: 8.4 g/dL — ABNORMAL LOW (ref 13.0–17.0)
Immature Granulocytes: 1 %
Lymphocytes Relative: 4 %
Lymphs Abs: 0.8 10*3/uL (ref 0.7–4.0)
MCH: 30 pg (ref 26.0–34.0)
MCHC: 32.6 g/dL (ref 30.0–36.0)
MCV: 92.1 fL (ref 80.0–100.0)
Monocytes Absolute: 0.4 10*3/uL (ref 0.1–1.0)
Monocytes Relative: 2 %
Neutro Abs: 16.9 10*3/uL — ABNORMAL HIGH (ref 1.7–7.7)
Neutrophils Relative %: 93 %
Platelets: 250 10*3/uL (ref 150–400)
RBC: 2.8 MIL/uL — ABNORMAL LOW (ref 4.22–5.81)
RDW: 14.2 % (ref 11.5–15.5)
WBC: 18.3 10*3/uL — ABNORMAL HIGH (ref 4.0–10.5)
nRBC: 0 % (ref 0.0–0.2)

## 2022-08-30 MED ORDER — FUROSEMIDE 40 MG PO TABS
40.0000 mg | ORAL_TABLET | Freq: Two times a day (BID) | ORAL | Status: DC
  Administered 2022-08-30 – 2022-08-31 (×3): 40 mg via ORAL
  Filled 2022-08-30 (×3): qty 1

## 2022-08-30 NOTE — TOC Progression Note (Signed)
Transition of Care [facility]) - Progression Note    Patient Details  Name: Brett Riley MRN: [id number] Date of Birth: 1936-02-10  Transition of Care [facility]) CM/SW Contact  Margarito Liner, LCSW Phone Number: 08/30/2022, 12:49 PM  Clinical Narrative:   [name] still pending.  Expected Discharge Plan: Skilled Nursing Facility Barriers to Discharge: Continued Medical Work up, English as a second language teacher  Expected Discharge Plan and Services     Post Acute Care Choice: Skilled Nursing Facility Living arrangements for the past 2 months: Single Family Home                                       Social Determinants of Health (SDOH) Interventions SDOH Screenings   Food Insecurity: No Food Insecurity (08/18/2022)  Housing: Low Risk  (08/18/2022)  Transportation Needs: No Transportation Needs (08/18/2022)  Utilities: Not At Risk (08/18/2022)  Depression (PHQ2-9): Low Risk  (06/06/2022)  Financial Resource Strain: Low Risk  (08/29/2020)  Physical Activity: Insufficiently Active (08/29/2020)  Social Connections: Unknown (08/29/2020)  Stress: No Stress Concern Present (08/29/2020)  Tobacco Use: Low Risk  (08/18/2022)    Readmission Risk Interventions     No data to display

## 2022-08-30 NOTE — Plan of Care (Signed)
°  Problem: Education: °Goal: Ability to demonstrate management of disease process will improve °Outcome: Progressing °Goal: Ability to verbalize understanding of medication therapies will improve °Outcome: Progressing °Goal: Individualized Educational Video(s) °Outcome: Progressing °  °Problem: Activity: °Goal: Capacity to carry out activities will improve °Outcome: Progressing °  °Problem: Cardiac: °Goal: Ability to achieve and maintain adequate cardiopulmonary perfusion will improve °Outcome: Progressing °  °Problem: Activity: °Goal: Ability to tolerate increased activity will improve °Outcome: Progressing °  °Problem: Clinical Measurements: °Goal: Ability to maintain a body temperature in the normal range will improve °Outcome: Progressing °  °Problem: Respiratory: °Goal: Ability to maintain adequate ventilation will improve °Outcome: Progressing °Goal: Ability to maintain a clear airway will improve °Outcome: Progressing °  °Problem: Education: °Goal: Knowledge of General Education information will improve °Description: Including pain rating scale, medication(s)/side effects and non-pharmacologic comfort measures °Outcome: Progressing °  °Problem: Health Behavior/Discharge Planning: °Goal: Ability to manage health-related needs will improve °Outcome: Progressing °  °Problem: Clinical Measurements: °Goal: Ability to maintain clinical measurements within normal limits will improve °Outcome: Progressing °Goal: Will remain free from infection °Outcome: Progressing °Goal: Diagnostic test results will improve °Outcome: Progressing °Goal: Respiratory complications will improve °Outcome: Progressing °Goal: Cardiovascular complication will be avoided °Outcome: Progressing °  °Problem: Activity: °Goal: Risk for activity intolerance will decrease °Outcome: Progressing °  °Problem: Nutrition: °Goal: Adequate nutrition will be maintained °Outcome: Progressing °  °Problem: Coping: °Goal: Level of anxiety will  decrease °Outcome: Progressing °  °Problem: Elimination: °Goal: Will not experience complications related to bowel motility °Outcome: Progressing °Goal: Will not experience complications related to urinary retention °Outcome: Progressing °  °Problem: Pain Managment: °Goal: General experience of comfort will improve °Outcome: Progressing °  °Problem: Safety: °Goal: Ability to remain free from injury will improve °Outcome: Progressing °  °Problem: Skin Integrity: °Goal: Risk for impaired skin integrity will decrease °Outcome: Progressing °  °

## 2022-08-30 NOTE — Progress Notes (Signed)
Physical Therapy Treatment Patient Details Name: Brett Riley MRN: [id number] DOB: [birth year] Today's Date: 08/30/2022   History of Present Illness Brett Riley is a 87 y/o M admitted with respiratory failure; other PMH includes MI, CHF, a-fib, HTN, anemia. Was at SNF doing rehab prior to admission; was previously admitted 5/18-6/7.    PT Comments    Patient agreeable to PT. Spouse at the bedside. Sitting balance has improved to fair today with no external support required initially. Standing performed x 3 bouts with rest breaks required between bouts of standing due to fatigue with activity. Standing tolerance is limited to less than 30 seconds. Pre-gait activity performed with modifying base of support in preparation for taking steps. Unable to progress to ambulation this session due to weakness and fatigue. Recommend to continue PT to maximize independence and facilitate return to prior level of function.     Recommendations for follow up therapy are one component of a multi-disciplinary discharge planning process, led by the attending physician.  Recommendations may be updated based on patient status, additional functional criteria and insurance authorization.  Follow Up Recommendations  Can patient physically be transported by private vehicle: No    Assistance Recommended at Discharge Frequent or constant Supervision/Assistance  Patient can return home with the following Two people to help with walking and/or transfers;Two people to help with bathing/dressing/bathroom;Assistance with cooking/housework;Assist for transportation;Help with stairs or ramp for entrance   Equipment Recommendations  None recommended by PT    Recommendations for Other Services       Precautions / Restrictions Precautions Precautions: Fall Restrictions Weight Bearing Restrictions: No     Mobility  Bed Mobility Overal bed mobility: Needs Assistance Bed Mobility: Supine to Sit, Sit to Supine      Supine to sit: Mod assist Sit to supine: Max assist   General bed mobility comments: increased time and effort required. cues for task initiation and sequencing    Transfers Overall transfer level: Needs assistance Equipment used: Rolling walker (2 wheels) Transfers: Sit to/from Stand Sit to Stand: Mod assist, +2 physical assistance           General transfer comment: lifting and lowering assistance provided. verbal cues for technique for anterior weight shifting and using momentum to facilitate standing. 3 standing bouts performed.    Ambulation/Gait             Pre-gait activities: standing tolerance less than 30 seconds. patient able to correct wide stance to normal in preparation for amblaution efforts with cues. activity tolerance limited for progression of ambluation General Gait Details: unable to   Stairs             Wheelchair Mobility    Modified Rankin (Stroke Patients Only)       Balance Overall balance assessment: Needs assistance Sitting-balance support: Feet supported, Bilateral upper extremity supported, Single extremity supported Sitting balance-Leahy Scale: Poor     Standing balance support: Bilateral upper extremity supported, Reliant on assistive device for balance Standing balance-Leahy Scale: Zero Standing balance comment: external support required to maintain upright                            Cognition Arousal/Alertness: Awake/alert Behavior During Therapy: Flat affect, WFL for tasks assessed/performed Overall Cognitive Status: Within Functional Limits for tasks assessed  General Comments: HOH        Exercises      General Comments General comments (skin integrity, edema, etc.): patient reports right leg weakness starting at home prior to this hospital stay. diffuse weakness throughout      Pertinent Vitals/Pain Pain Assessment Pain Assessment: No/denies pain     Home Living                          Prior Function            PT Goals (current goals can now be found in the care plan section) Acute Rehab PT Goals Patient Stated Goal: get stronger PT Goal Formulation: With patient/family Time For Goal Achievement: 09/10/22 Potential to Achieve Goals: Fair Progress towards PT goals: Progressing toward goals    Frequency    Min 2X/week      PT Plan Current plan remains appropriate;Discharge plan needs to be updated;Frequency needs to be updated    Co-evaluation              AM-PAC PT "6 Clicks" Mobility   Outcome Measure  Help needed turning from your back to your side while in a flat bed without using bedrails?: A Lot Help needed moving from lying on your back to sitting on the side of a flat bed without using bedrails?: A Lot Help needed moving to and from a bed to a chair (including a wheelchair)?: Total Help needed standing up from a chair using your arms (e.g., wheelchair or bedside chair)?: Total Help needed to walk in hospital room?: Total Help needed climbing 3-5 steps with a railing? : Total 6 Click Score: 8    End of Session Equipment Utilized During Treatment: Oxygen Activity Tolerance: Patient tolerated treatment well Patient left: in bed;with call bell/phone within reach;with bed alarm set Nurse Communication: Mobility status PT Visit Diagnosis: Muscle weakness (generalized) (M62.81);Difficulty in walking, not elsewhere classified (R26.2);Unsteadiness on feet (R26.81);Other abnormalities of gait and mobility (R26.89)     Time: 1025-1040 PT Time Calculation (min) (ACUTE ONLY): 15 min  Charges:  $Therapeutic Activity: 8-22 mins                     Donna Bernard, PT, MPT    Ina Homes 08/30/2022, 11:58 AM

## 2022-08-30 NOTE — Progress Notes (Signed)
PROGRESS NOTE    Brett Riley  [name] DOB: [birth year] DOA: 08/18/2022 PCP: Dale Lillie, MD    Brief Narrative:  87 y.o. male with medical history significant of chronic dCHF, HTN, HLD, CAD, CKD-3a, anemia, chronic A fib on Eliquis, who presents 08/18/2022 to ED from rehab, chief complaint SOB.  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 Net IO Since Admission: -5,549.14 mL [08/20/22 1608]. Heparin stopped d/t rectal bleeding overnight.  06/19: euvolemic per cardio, d/c lasix, O2 requirement still high, CT chest reviewed, ?edema/atypical infection, given clinically worsening respiratory status have asked pulmonary and ID to consult. Have d/c amiodarone and ordered steroids in case amio toxicity.  06/20: ID and pulmonary assisting with the management.   6/22-6/25: patient feels clinically improved. Stable with oxygen via [place] and weaned down to 2-3L. Patient requiring judicial adjustments to balance between hypervolemia and renal injury with diuresis. Unable to pull fluid from lungs with thoracentesis so medical management is best option. His IV Abx to treat his vertebral osteomyelitis from previous admission continue unchanged and managed by ID.   6/28: Bed offered at [facility].  Authorization pending   Assessment & Plan:   Principal Problem:   Acute respiratory failure with hypoxia (HCC) Active Problems:   Acute on chronic heart failure with preserved ejection fraction (HFpEF) (HCC)   MSSA bacteremia   Vertebral osteomyelitis (HCC)   CAD (coronary artery disease)    Myocardial injury   Hypertension   Hyperlipidemia   Chronic kidney disease, stage 3a (HCC)   Normocytic anemia   Atrial fibrillation, chronic (HCC)   Paroxysmal atrial fibrillation (HCC)   HCAP (healthcare-associated pneumonia)   Sepsis (HCC)   Pleural effusion   Amiodarone pulmonary toxicity   Acute on chronic HFrEF (heart failure with reduced ejection fraction) (HCC)   Pleural effusion on right   Status post thoracentesis   Heart failure with preserved ejection fraction (HCC)   Stage 3 chronic kidney disease (HCC)  Acute on chronic respiratory failure with hypoxia due to acute on chronic CHF  possible pneumonia, bilateral small pleural effusion- as seen on chest CT Not on oxygen supplementation at baseline Currently on 3L with O2 saturation in upper 90s. Plan: - wean as tolerated - f/u sputum culture results- NGTD -Completed course of azithromycin. - Continue Ancef as written for his osteomyelitis.  Last dose 7/12 - incentive spirometer q1hr - wean steroids -Patient remains medically stable for discharge   Acute on chronic diastolic CHF- improving. Down about 5.2kg since 6/16. Echo 6/17: LVEF of 50-55% with RV systolic function is normal. BNP 673. Plan: Lasix 40 mg p.o. twice daily TED hose Strict I's and O's, daily weights   Pleural effusion- IR unable to perform thoracentesis 6/20, 6/21. Medical management. Clinically improving respiratory status.     Atrial fibrillation, chronic (HCC): Heart rate now well controlled in 60-70s diltiazem, metoprolol per cardiology Lisinopril held with AKI Restarted eliquis (renally dosed) in setting of no new bleeding and patient's hgb has been stable >5 days after concern for GI bleed early in admission on heparin gtt.   Recent hx of MSSA bacteremia  vertebral osteomyelitis (HCC): continue IV Ancef until 7/12. Follow up with ID outpatient 7/11 repeat Blood culture NGTD   CAD  Myocardial injury  HLD:  trop  258, possibly due to  demand ischemia secondary to CHF exacerbation.  Patient does not have chest pain. Crestor, beta blocker, Aspirin 81 discontinued as he's on eliquis  Cardiology following  May need coronary angiography at some point but will defer for now     AKI on Chronic kidney disease, stage 3a [facility]): Cr stable 2.4>2.4 even with use of diuretics. Baseline creatinine 1.5-1.9 Follow BMP Nephrology following    Normocytic anemia: hgb stable around 8.5-9. S/p 1 unit PRBC 06/18 ABLA w/ rectal bleed   Hypertension Continue to closely monitor vital signs.   Resolved hypokalemia   GERD PPI    Right knee pain -X-ray with chronic arthritic changes.  Recommend frequent mobility   DVT prophylaxis: Eliquis Code Status: DNR Family Communication: Spouse at bedside 6/26, 6/27, 6/28 Disposition Plan: Status is: Inpatient Remains inpatient appropriate because: Pending placement.  Unsafe discharge plan   Level of care: Stepdown  Consultants:  Cardiology Nephrology  Procedures:  None  Antimicrobials: Ancef   Subjective: Seen and examined.  Sitting in the morning.  Wife at bedside.  Patient working with physical and Occupational Therapy  Objective: Vitals:   08/29/22 2052 08/29/22 2314 08/30/22 0437 08/30/22 0731  BP: 123/69 135/72 121/70 (!) 144/75  Pulse: 62 64 65 70  Resp: 18 20 18 20   Temp: 98.4 F (36.9 C) 97.8 F (36.6 C) 97.7 F (36.5 C) 97.9 F (36.6 C)  TempSrc:  Oral Oral   SpO2: 98% 97% 96% 98%  Weight:   84.8 kg   Height:        Intake/Output Summary (Last 24 hours) at 08/30/2022 1237 Last data filed at 08/30/2022 1102 Gross per 24 hour  Intake 240 ml  Output 1950 ml  Net -1710 ml   Filed Weights   08/27/22 0439 08/28/22 0434 08/30/22 0437  Weight: 86.7 kg 84 kg 84.8 kg    Examination:  General exam: NAD.  Appears more awake.  Working with therapy Respiratory system: Right-sided crackles.  Normal work of breathing.  3 L Cardiovascular system: S1-S2, RRR, no  murmurs, no pedal edema Gastrointestinal system: Soft, NT/ND, normal bowel sounds Central nervous system: Alert and oriented. No focal neurological deficits. Extremities: Right lower extremity decreased ROM Skin: No rashes, lesions or ulcers Psychiatry: Judgement and insight appear normal. Mood & affect flattened.     Data Reviewed: I have personally reviewed following labs and imaging studies  CBC: Recent Labs  Lab 08/24/22 0920 08/25/22 0630 08/26/22 0440 08/27/22 0454 08/28/22 0415 08/29/22 0500 08/30/22 0640  WBC 9.6   < > 9.2 10.1 19.1* 24.6* 18.3*  NEUTROABS 8.2*  --   --   --   --   --  16.9*  HGB 9.0*   < > 8.6* 8.3* 8.8* 8.7* 8.4*  HCT 27.6*   < > 26.8* 26.2* 27.1* 26.6* 25.8*  MCV 93.6   < > 93.1 93.2 91.6 93.0 92.1  PLT 279   < > 251 248 264 253 250   < > = values in this interval not displayed.   Basic Metabolic Panel: Recent Labs  Lab 08/24/22 0630 08/25/22 0630 08/26/22 0440 08/27/22 0454 08/28/22 1205 08/30/22 0640  NA 140 140 140 137 134* 133*  K 3.8 3.7 3.5 3.5 3.6 3.6  CL 104 105 103 99 96* 97*  CO2 26  25 25 27 28 26   GLUCOSE 120* 133* 138* 126* 125* 105*  BUN 66* 73* 81* 89* 92* 97*  CREATININE 2.43* 2.46* 2.45* 2.40* 2.18* 2.31*  CALCIUM 8.0* 8.0* 8.3* 8.4* 8.1* 8.2*  MG 2.5*  --   --   --   --   --   PHOS 3.5  --   --   --   --   --    GFR: Estimated Creatinine Clearance: 26.1 mL/min (A) (by C-G formula based on SCr of 2.31 mg/dL (H)). Liver Function Tests: Recent Labs  Lab 08/24/22 0630  AST 141*  ALT 15  ALKPHOS 117  BILITOT 0.6  PROT 6.1*  ALBUMIN 2.0*   No results for input(s): "LIPASE", "AMYLASE" in the last 168 hours. No results for input(s): "AMMONIA" in the last 168 hours. Coagulation Profile: No results for input(s): "INR", "PROTIME" in the last 168 hours. Cardiac Enzymes: Recent Labs  Lab 08/23/22 1950  CKTOTAL 31*   BNP (last 3 results) No results for input(s): "PROBNP" in the last 8760 hours. HbA1C: No results  for input(s): "HGBA1C" in the last 72 hours. CBG: No results for input(s): "GLUCAP" in the last 168 hours. Lipid Profile: No results for input(s): "CHOL", "HDL", "LDLCALC", "TRIG", "CHOLHDL", "LDLDIRECT" in the last 72 hours. Thyroid Function Tests: No results for input(s): "TSH", "T4TOTAL", "FREET4", "T3FREE", "THYROIDAB" in the last 72 hours. Anemia Panel: No results for input(s): "VITAMINB12", "FOLATE", "FERRITIN", "TIBC", "IRON", "RETICCTPCT" in the last 72 hours. Sepsis Labs: No results for input(s): "PROCALCITON", "LATICACIDVEN" in the last 168 hours.   Recent Results (from the past 240 hour(s))  Respiratory (~20 pathogens) panel by PCR     Status: None   Collection Time: 08/21/22  1:50 PM   Specimen: Nasopharyngeal Swab; Respiratory  Result Value Ref Range Status   Adenovirus NOT DETECTED NOT DETECTED Final   Coronavirus 229E NOT DETECTED NOT DETECTED Final    Comment: (NOTE) The Coronavirus on the Respiratory Panel, DOES NOT test for the novel  Coronavirus (2019 nCoV)    Coronavirus HKU1 NOT DETECTED NOT DETECTED Final   Coronavirus NL63 NOT DETECTED NOT DETECTED Final   Coronavirus OC43 NOT DETECTED NOT DETECTED Final   Metapneumovirus NOT DETECTED NOT DETECTED Final   Rhinovirus / Enterovirus NOT DETECTED NOT DETECTED Final   Influenza A NOT DETECTED NOT DETECTED Final   Influenza B NOT DETECTED NOT DETECTED Final   Parainfluenza Virus 1 NOT DETECTED NOT DETECTED Final   Parainfluenza Virus 2 NOT DETECTED NOT DETECTED Final   Parainfluenza Virus 3 NOT DETECTED NOT DETECTED Final   Parainfluenza Virus 4 NOT DETECTED NOT DETECTED Final   Respiratory Syncytial Virus NOT DETECTED NOT DETECTED Final   Bordetella pertussis NOT DETECTED NOT DETECTED Final   Bordetella Parapertussis NOT DETECTED NOT DETECTED Final   Chlamydophila pneumoniae NOT DETECTED NOT DETECTED Final   Mycoplasma pneumoniae NOT DETECTED NOT DETECTED Final    Comment: Performed at [facility]  Lab, [address] [place], [place] [address]  Culture, blood (Routine X 2) w Reflex to ID Panel     Status: None   Collection Time: 08/21/22  8:58 PM   Specimen: BLOOD  Result Value Ref Range Status   Specimen Description BLOOD LEFT WRIST  Final   Special Requests   Final    BOTTLES DRAWN AEROBIC AND ANAEROBIC Blood Culture adequate volume   Culture   Final    NO GROWTH 5 DAYS Performed at [facility], [address] [place], [place] [address]    Report Status 08/26/2022 FINAL  Final  Culture, blood (Routine X 2) w Reflex to ID Panel     Status: None   Collection Time: 08/21/22 11:01 PM   Specimen: BLOOD  Result Value Ref Range Status   Specimen Description BLOOD BLOOD LEFT ARM  Final   Special Requests   Final    BOTTLES DRAWN AEROBIC AND ANAEROBIC Blood Culture adequate volume   Culture   Final    NO GROWTH 5 DAYS Performed at [facility], [address] [place], [place] [address]    Report Status 08/26/2022 FINAL  Final  Expectorated Sputum Assessment w Gram Stain, Rflx to Resp Cult     Status: None   Collection Time: 08/22/22 11:22 AM   Specimen: Sputum  Result Value Ref Range Status   Specimen Description SPUTUM  Final   Special Requests EXPSU  Final   Sputum evaluation   Final    THIS SPECIMEN IS ACCEPTABLE FOR SPUTUM CULTURE Performed at [facility], [address] [place], [place] [address]    Report Status 08/22/2022 FINAL  Final  Culture, Respiratory w Gram Stain     Status: None   Collection Time: 08/22/22 11:22 AM   Specimen: SPU  Result Value Ref Range Status   Specimen Description   Final    SPUTUM Performed at [facility], [address], [place], [place] [address]    Special Requests   Final    EXPSU Reflexed from [id number] Performed at [facility], [address] [place], [place] [address]    Gram Stain   Final    ABUNDANT SQUAMOUS EPITHELIAL CELLS PRESENT FEW WBC PRESENT, PREDOMINANTLY  PMN ABUNDANT GRAM VARIABLE ROD ABUNDANT GRAM POSITIVE COCCI IN PAIRS    Culture   Final    Normal respiratory flora-no Staph aureus or Pseudomonas seen Performed at [facility], [address], [place], [place] [address]    Report Status 08/25/2022 FINAL  Final         Radiology Studies: No results found.      Scheduled Meds:  apixaban  2.5 mg Oral BID   Chlorhexidine Gluconate Cloth  6 each Topical Daily   cyanocobalamin  1,000 mcg Oral Daily   diltiazem  360 mg Oral Daily   feeding supplement  237 mL Oral BID BM   furosemide  40 mg Oral BID   methylPREDNISolone  4 mg Oral Daily   metoprolol succinate  100 mg Oral Daily   multivitamins with iron  1 tablet Oral Daily   pantoprazole  40 mg Oral Daily   polyethylene glycol  17 g Oral Daily   rosuvastatin  40 mg Oral Daily   senna  1 tablet Oral Daily   Continuous Infusions:  ceFAZolin 2 g (08/30/22 1005)     LOS: 12 days     Tresa Moore, MD [facility]   If 7PM-7AM, please contact night-coverage  08/30/2022, 12:37 PM

## 2022-08-31 DIAGNOSIS — J9601 Acute respiratory failure with hypoxia: Secondary | ICD-10-CM | POA: Diagnosis not present

## 2022-08-31 NOTE — Progress Notes (Signed)
PROGRESS NOTE    Brett Riley  [name] DOB: Nov 30, 1935 DOA: 08/18/2022 PCP: [place], MD    Brief Narrative:  87 y.o. male with medical history significant of chronic dCHF, HTN, HLD, CAD, CKD-3a, anemia, chronic A fib on Eliquis, who presents 08/18/2022 to ED from rehab, chief complaint SOB.  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 Net IO Since Admission: -5,549.14 mL [08/20/22 1608]. Heparin stopped d/t rectal bleeding overnight.  06/19: euvolemic per cardio, d/c lasix, O2 requirement still high, CT chest reviewed, ?edema/atypical infection, given clinically worsening respiratory status have asked pulmonary and ID to consult. Have d/c amiodarone and ordered steroids in case amio toxicity.  06/20: ID and pulmonary assisting with the management.   6/22-6/25: patient feels clinically improved. Stable with oxygen via [place] and weaned down to 2-3L. Patient requiring judicial adjustments to balance between hypervolemia and renal injury with diuresis. Unable to pull fluid from lungs with thoracentesis so medical management is best option. His IV Abx to treat his vertebral osteomyelitis from previous admission continue unchanged and managed by ID.   6/28: Bed offered at [facility].  Authorization pending   Assessment & Plan:   Principal Problem:   Acute respiratory failure with hypoxia (HCC) Active Problems:   Acute on chronic heart failure with preserved ejection fraction (HFpEF) (HCC)   MSSA bacteremia   Vertebral osteomyelitis (HCC)   CAD (coronary artery disease)    Myocardial injury   Hypertension   Hyperlipidemia   Chronic kidney disease, stage 3a (HCC)   Normocytic anemia   Atrial fibrillation, chronic (HCC)   Paroxysmal atrial fibrillation (HCC)   HCAP (healthcare-associated pneumonia)   Sepsis (HCC)   Pleural effusion   Amiodarone pulmonary toxicity   Acute on chronic HFrEF (heart failure with reduced ejection fraction) (HCC)   Pleural effusion on right   Status post thoracentesis   Heart failure with preserved ejection fraction (HCC)   Stage 3 chronic kidney disease (HCC)  Acute on chronic respiratory failure with hypoxia due to acute on chronic CHF  possible pneumonia, bilateral small pleural effusion- as seen on chest CT Not on oxygen supplementation at baseline Currently on 3L with O2 saturation in upper 90s. Plan: - wean as tolerated - f/u sputum culture results- NGTD -Completed course of azithromycin. - Continue Ancef as written for his osteomyelitis.  Last dose 7/12 - incentive spirometer q1hr - wean steroids -Medically stable for DC   Acute on chronic diastolic CHF- improving. Down about 5.2kg since 6/16. Echo 6/17: LVEF of 50-55% with RV systolic function is normal. BNP 673. Plan: Lasix 40 mg p.o. twice daily TED hose Strict I's and O's, daily weights   Pleural effusion- IR unable to perform thoracentesis 6/20, 6/21. Medical management. Clinically improving respiratory status.     Atrial fibrillation, chronic (HCC): Heart rate now well controlled in 60-70s diltiazem, metoprolol per cardiology Lisinopril held with AKI Restarted eliquis (renally dosed) in setting of no new bleeding and patient's hgb has been stable >5 days after concern for GI bleed early in admission on heparin gtt.   Recent hx of MSSA bacteremia vertebral osteomyelitis (  HCC): continue IV Ancef until 7/12. Follow up with ID outpatient 7/11 repeat Blood culture NGTD   CAD  Myocardial injury  HLD:  trop  258, possibly due to demand ischemia secondary  to CHF exacerbation.  Patient does not have chest pain. Crestor, beta blocker, Aspirin 81 discontinued as he's on eliquis  Cardiology following  May need coronary angiography at some point but will defer for now     AKI on Chronic kidney disease, stage 3a [facility]): Cr stable 2.4>2.4 even with use of diuretics. Baseline creatinine 1.5-1.9 Follow BMP Nephrology following    Normocytic anemia: hgb stable around 8.5-9. S/p 1 unit PRBC 06/18 ABLA w/ rectal bleed   Hypertension Continue to closely monitor vital signs.   Resolved hypokalemia   GERD PPI    Right knee pain -X-ray with chronic arthritic changes.  Recommend frequent mobility   DVT prophylaxis: Eliquis Code Status: DNR Family Communication: Spouse at bedside 6/26, 6/27, 6/28 Disposition Plan: Status is: Inpatient Remains inpatient appropriate because: Pending placement.  Unsafe discharge plan   Level of care: Stepdown  Consultants:  Cardiology Nephrology  Procedures:  None  Antimicrobials: Ancef   Subjective: Seen and examined.  Sitting in the morning.  Wife at bedside.  Patient working with physical and Occupational Therapy  Objective: Vitals:   08/30/22 2305 08/31/22 0412 08/31/22 0850 08/31/22 1106  BP: 134/62 (!) 151/70 (!) 145/72 122/63  Pulse: 68 71 70 73  Resp: 20 20 20 20   Temp: (!) 97.4 F (36.3 C) 97.8 F (36.6 C) 98.3 F (36.8 C) 98.1 F (36.7 C)  TempSrc:      SpO2: 98% 100% 99% 100%  Weight:      Height:        Intake/Output Summary (Last 24 hours) at 08/31/2022 1258 Last data filed at 08/31/2022 0433 Gross per 24 hour  Intake 480 ml  Output 1300 ml  Net -820 ml   Filed Weights   08/27/22 0439 08/28/22 0434 08/30/22 0437  Weight: 86.7 kg 84 kg 84.8 kg    Examination:  General exam: NAD.  Appears more awake.  Working with therapy Respiratory system: Right-sided crackles.  Normal work of breathing.  3 L Cardiovascular system: S1-S2, RRR, no murmurs, no pedal  edema Gastrointestinal system: Soft, NT/ND, normal bowel sounds Central nervous system: Alert and oriented. No focal neurological deficits. Extremities: Right lower extremity decreased ROM Skin: No rashes, lesions or ulcers Psychiatry: Judgement and insight appear normal. Mood & affect flattened.     Data Reviewed: I have personally reviewed following labs and imaging studies  CBC: Recent Labs  Lab 08/26/22 0440 08/27/22 0454 08/28/22 0415 08/29/22 0500 08/30/22 0640  WBC 9.2 10.1 19.1* 24.6* 18.3*  NEUTROABS  --   --   --   --  16.9*  HGB 8.6* 8.3* 8.8* 8.7* 8.4*  HCT 26.8* 26.2* 27.1* 26.6* 25.8*  MCV 93.1 93.2 91.6 93.0 92.1  PLT 251 248 264 253 250   Basic Metabolic Panel: Recent Labs  Lab 08/25/22 0630 08/26/22 0440 08/27/22 0454 08/28/22 1205 08/30/22 0640  NA 140 140 137 134* 133*  K 3.7 3.5 3.5 3.6 3.6  CL 105 103 99 96* 97*  CO2 25 25 27 28 26   GLUCOSE 133* 138* 126* 125* 105*  BUN 73* 81* 89* 92* 97*  CREATININE 2.46* 2.45* 2.40* 2.18* 2.31*  CALCIUM 8.0* 8.3* 8.4* 8.1* 8.2*   GFR: Estimated Creatinine Clearance: 26.1 mL/min (A) (by C-G formula based on SCr of 2.31 mg/dL (  H)). Liver Function Tests: No results for input(s): "AST", "ALT", "ALKPHOS", "BILITOT", "PROT", "ALBUMIN" in the last 168 hours.  No results for input(s): "LIPASE", "AMYLASE" in the last 168 hours. No results for input(s): "AMMONIA" in the last 168 hours. Coagulation Profile: No results for input(s): "INR", "PROTIME" in the last 168 hours. Cardiac Enzymes: No results for input(s): "CKTOTAL", "CKMB", "CKMBINDEX", "TROPONINI" in the last 168 hours.  BNP (last 3 results) No results for input(s): "PROBNP" in the last 8760 hours. HbA1C: No results for input(s): "HGBA1C" in the last 72 hours. CBG: No results for input(s): "GLUCAP" in the last 168 hours. Lipid Profile: No results for input(s): "CHOL", "HDL", "LDLCALC", "TRIG", "CHOLHDL", "LDLDIRECT" in the last 72 hours. Thyroid  Function Tests: No results for input(s): "TSH", "T4TOTAL", "FREET4", "T3FREE", "THYROIDAB" in the last 72 hours. Anemia Panel: No results for input(s): "VITAMINB12", "FOLATE", "FERRITIN", "TIBC", "IRON", "RETICCTPCT" in the last 72 hours. Sepsis Labs: No results for input(s): "PROCALCITON", "LATICACIDVEN" in the last 168 hours.   Recent Results (from the past 240 hour(s))  Respiratory (~20 pathogens) panel by PCR     Status: None   Collection Time: 08/21/22  1:50 PM   Specimen: Nasopharyngeal Swab; Respiratory  Result Value Ref Range Status   Adenovirus NOT DETECTED NOT DETECTED Final   Coronavirus 229E NOT DETECTED NOT DETECTED Final    Comment: (NOTE) The Coronavirus on the Respiratory Panel, DOES NOT test for the novel  Coronavirus (2019 nCoV)    Coronavirus HKU1 NOT DETECTED NOT DETECTED Final   Coronavirus NL63 NOT DETECTED NOT DETECTED Final   Coronavirus OC43 NOT DETECTED NOT DETECTED Final   Metapneumovirus NOT DETECTED NOT DETECTED Final   Rhinovirus / Enterovirus NOT DETECTED NOT DETECTED Final   Influenza A NOT DETECTED NOT DETECTED Final   Influenza B NOT DETECTED NOT DETECTED Final   Parainfluenza Virus 1 NOT DETECTED NOT DETECTED Final   Parainfluenza Virus 2 NOT DETECTED NOT DETECTED Final   Parainfluenza Virus 3 NOT DETECTED NOT DETECTED Final   Parainfluenza Virus 4 NOT DETECTED NOT DETECTED Final   Respiratory Syncytial Virus NOT DETECTED NOT DETECTED Final   Bordetella pertussis NOT DETECTED NOT DETECTED Final   Bordetella Parapertussis NOT DETECTED NOT DETECTED Final   Chlamydophila pneumoniae NOT DETECTED NOT DETECTED Final   Mycoplasma pneumoniae NOT DETECTED NOT DETECTED Final    Comment: Performed at [facility], [address], [place], [place] [address]  Culture, blood (Routine X 2) w Reflex to ID Panel     Status: None   Collection Time: 08/21/22  8:58 PM   Specimen: BLOOD  Result Value Ref Range Status   Specimen Description BLOOD LEFT WRIST   Final   Special Requests   Final    BOTTLES DRAWN AEROBIC AND ANAEROBIC Blood Culture adequate volume   Culture   Final    NO GROWTH 5 DAYS Performed at [facility], [address], [place], [place] [address]    Report Status 08/26/2022 FINAL  Final  Culture, blood (Routine X 2) w Reflex to ID Panel     Status: None   Collection Time: 08/21/22 11:01 PM   Specimen: BLOOD  Result Value Ref Range Status   Specimen Description BLOOD BLOOD LEFT ARM  Final   Special Requests   Final    BOTTLES DRAWN AEROBIC AND ANAEROBIC Blood Culture adequate volume   Culture   Final    NO GROWTH 5 DAYS Performed at [facility], [address] [place], [place] [address]    Report Status 08/26/2022 FINAL  Final  Expectorated Sputum Assessment w Gram Stain, Rflx to Resp Cult     Status: None   Collection Time: 08/22/22 11:22 AM   Specimen: Sputum  Result Value Ref Range Status   Specimen Description SPUTUM  Final   Special Requests EXPSU  Final   Sputum evaluation   Final    THIS SPECIMEN IS ACCEPTABLE FOR SPUTUM CULTURE Performed at [facility], [address] [place], [place] [address]    Report Status 08/22/2022 FINAL  Final  Culture, Respiratory w Gram Stain     Status: None   Collection Time: 08/22/22 11:22 AM   Specimen: SPU  Result Value Ref Range Status   Specimen Description   Final    SPUTUM Performed at [facility], [address], [place], [place] [address]    Special Requests   Final    EXPSU Reflexed from [id number] Performed at [facility], [address] [place], [place] [address]    Gram Stain   Final    ABUNDANT SQUAMOUS EPITHELIAL CELLS PRESENT FEW WBC PRESENT, PREDOMINANTLY PMN ABUNDANT GRAM VARIABLE ROD ABUNDANT GRAM POSITIVE COCCI IN PAIRS    Culture   Final    Normal respiratory flora-no Staph aureus or Pseudomonas seen Performed at [facility], [address], [place], [place] [address]    Report Status  08/25/2022 FINAL  Final         Radiology Studies: No results found.      Scheduled Meds:  apixaban  2.5 mg Oral BID   Chlorhexidine Gluconate Cloth  6 each Topical Daily   cyanocobalamin  1,000 mcg Oral Daily   diltiazem  360 mg Oral Daily   feeding supplement  237 mL Oral BID BM   furosemide  40 mg Oral BID   metoprolol succinate  100 mg Oral Daily   multivitamins with iron  1 tablet Oral Daily   pantoprazole  40 mg Oral Daily   polyethylene glycol  17 g Oral Daily   rosuvastatin  40 mg Oral Daily   senna  1 tablet Oral Daily   Continuous Infusions:  ceFAZolin 2 g (08/31/22 1102)     LOS: 13 days     Tresa Moore, MD [facility]   If 7PM-7AM, please contact night-coverage  08/31/2022, 12:58 PM

## 2022-08-31 NOTE — Progress Notes (Signed)
[facility]  ROUNDING NOTE   Subjective:   Patient sitting up in bed Wife at bedside  Remains on 2L [place] Lower extremity edema remains  Creatinine 2.31 (2.18) (2.40) UOP 1.65L  Objective:  Vital signs in last 24 hours:  Temp:  [97.4 F (36.3 C)-98.6 F (37 C)] 98.1 F (36.7 C) (06/29 1106) Pulse Rate:  [65-73] 73 (06/29 1106) Resp:  [18-20] 20 (06/29 1106) BP: (114-152)/(58-72) 122/63 (06/29 1106) SpO2:  [95 %-100 %] 100 % (06/29 1106)  Weight change:  Filed Weights   08/27/22 0439 08/28/22 0434 08/30/22 0437  Weight: 86.7 kg 84 kg 84.8 kg    Intake/Output: I/O last 3 completed shifts: In: 480 [P.O.:480] Out: 2600 [Urine:2600]   Intake/Output this shift:  No intake/output data recorded.  Physical Exam: General: NAD, laying in bed  Head: Normocephalic, atraumatic. Moist oral mucosal membranes  Eyes: Anicteric  Lungs:  Diminished, 2L [place]  Heart: Regular rate and rhythm  Abdomen:  Soft, nontender  Extremities:  ++ peripheral edema.  Neurologic: Nonfocal, moving all four extremities  Skin: No lesions  Access: none    Basic Metabolic Panel: Recent Labs  Lab 08/25/22 0630 08/26/22 0440 08/27/22 0454 08/28/22 1205 08/30/22 0640  NA 140 140 137 134* 133*  K 3.7 3.5 3.5 3.6 3.6  CL 105 103 99 96* 97*  CO2 25 25 27 28 26   GLUCOSE 133* 138* 126* 125* 105*  BUN 73* 81* 89* 92* 97*  CREATININE 2.46* 2.45* 2.40* 2.18* 2.31*  CALCIUM 8.0* 8.3* 8.4* 8.1* 8.2*     Liver Function Tests: No results for input(s): "AST", "ALT", "ALKPHOS", "BILITOT", "PROT", "ALBUMIN" in the last 168 hours.  No results for input(s): "LIPASE", "AMYLASE" in the last 168 hours. No results for input(s): "AMMONIA" in the last 168 hours.  CBC: Recent Labs  Lab 08/26/22 0440 08/27/22 0454 08/28/22 0415 08/29/22 0500 08/30/22 0640  WBC 9.2 10.1 19.1* 24.6* 18.3*  NEUTROABS  --   --   --   --  16.9*  HGB 8.6* 8.3* 8.8* 8.7* 8.4*  HCT 26.8* 26.2* 27.1* 26.6* 25.8*   MCV 93.1 93.2 91.6 93.0 92.1  PLT 251 248 264 253 250     Cardiac Enzymes: No results for input(s): "CKTOTAL", "CKMB", "CKMBINDEX", "TROPONINI" in the last 168 hours.   BNP: Invalid input(s): "POCBNP"  CBG: No results for input(s): "GLUCAP" in the last 168 hours.   Microbiology: Results for orders placed or performed during the hospital encounter of 08/18/22  Blood culture (routine x 2)     Status: None   Collection Time: 08/18/22 10:45 AM   Specimen: BLOOD LEFT ARM  Result Value Ref Range Status   Specimen Description BLOOD LEFT ARM  Final   Special Requests   Final    BOTTLES DRAWN AEROBIC AND ANAEROBIC Blood Culture adequate volume   Culture   Final    NO GROWTH 5 DAYS Performed at [facility], [address], [place], [place] [address]    Report Status 08/23/2022 FINAL  Final  Resp Panel by RT-PCR (Flu A&B, Covid) Anterior Nasal Swab     Status: None   Collection Time: 08/18/22 10:46 AM   Specimen: Anterior Nasal Swab  Result Value Ref Range Status   SARS Coronavirus 2 by RT PCR NEGATIVE NEGATIVE Final    Comment: (NOTE) SARS-CoV-2 target nucleic acids are NOT DETECTED.  The SARS-CoV-2 RNA is generally detectable in upper respiratory specimens during the acute phase of infection. The lowest  concentration of SARS-CoV-2 viral copies this assay can detect is 138 copies/mL. A negative result does not preclude SARS-Cov-2 infection and should not be used as the sole basis for treatment or other patient management decisions. A negative result may occur with  improper specimen collection/handling, submission of specimen other than nasopharyngeal swab, presence of viral mutation(s) within the areas targeted by this assay, and inadequate number of viral copies(<138 copies/mL). A negative result must be combined with clinical observations, patient history, and epidemiological information. The expected result is Negative.  Fact Sheet for Patients:   BloggerCourse.com  Fact Sheet for Healthcare Providers:  SeriousBroker.it  This test is no t yet approved or cleared by the [place] FDA and  has been authorized for detection and/or diagnosis of SARS-CoV-2 by FDA under an Emergency Use Authorization (EUA). This EUA will remain  in effect (meaning this test can be used) for the duration of the COVID-19 declaration under Section 564(b)(1) of the Act, 21 U.S.C.section 360bbb-3(b)(1), unless the authorization is terminated  or revoked sooner.       Influenza A by PCR NEGATIVE NEGATIVE Final   Influenza B by PCR NEGATIVE NEGATIVE Final    Comment: (NOTE) The Xpert Xpress SARS-CoV-2/FLU/RSV plus assay is intended as an aid in the diagnosis of influenza from Nasopharyngeal swab specimens and should not be used as a sole basis for treatment. Nasal washings and aspirates are unacceptable for Xpert Xpress SARS-CoV-2/FLU/RSV testing.  Fact Sheet for Patients: BloggerCourse.com  Fact Sheet for Healthcare Providers: SeriousBroker.it  This test is not yet approved or cleared by the [place] FDA and has been authorized for detection and/or diagnosis of SARS-CoV-2 by FDA under an Emergency Use Authorization (EUA). This EUA will remain in effect (meaning this test can be used) for the duration of the COVID-19 declaration under Section 564(b)(1) of the Act, 21 U.S.C. section 360bbb-3(b)(1), unless the authorization is terminated or revoked.  Performed at [facility], [address] [place], [place] [address]   Culture, blood (Routine X 2) w Reflex to ID Panel     Status: None   Collection Time: 08/18/22  9:19 PM   Specimen: BLOOD LEFT ARM  Result Value Ref Range Status   Specimen Description BLOOD LEFT ARM  Final   Special Requests   Final    BOTTLES DRAWN AEROBIC AND ANAEROBIC Blood Culture adequate volume    Culture   Final    NO GROWTH 5 DAYS Performed at [facility], [address] [place], [place] [address]    Report Status 08/23/2022 FINAL  Final  Respiratory (~20 pathogens) panel by PCR     Status: None   Collection Time: 08/21/22  1:50 PM   Specimen: Nasopharyngeal Swab; Respiratory  Result Value Ref Range Status   Adenovirus NOT DETECTED NOT DETECTED Final   Coronavirus 229E NOT DETECTED NOT DETECTED Final    Comment: (NOTE) The Coronavirus on the Respiratory Panel, DOES NOT test for the novel  Coronavirus (2019 nCoV)    Coronavirus HKU1 NOT DETECTED NOT DETECTED Final   Coronavirus NL63 NOT DETECTED NOT DETECTED Final   Coronavirus OC43 NOT DETECTED NOT DETECTED Final   Metapneumovirus NOT DETECTED NOT DETECTED Final   Rhinovirus / Enterovirus NOT DETECTED NOT DETECTED Final   Influenza A NOT DETECTED NOT DETECTED Final   Influenza B NOT DETECTED NOT DETECTED Final   Parainfluenza Virus 1 NOT DETECTED NOT DETECTED Final   Parainfluenza Virus 2 NOT DETECTED NOT DETECTED Final   Parainfluenza  Virus 3 NOT DETECTED NOT DETECTED Final   Parainfluenza Virus 4 NOT DETECTED NOT DETECTED Final   Respiratory Syncytial Virus NOT DETECTED NOT DETECTED Final   Bordetella pertussis NOT DETECTED NOT DETECTED Final   Bordetella Parapertussis NOT DETECTED NOT DETECTED Final   Chlamydophila pneumoniae NOT DETECTED NOT DETECTED Final   Mycoplasma pneumoniae NOT DETECTED NOT DETECTED Final    Comment: Performed at [facility], [address], [place], [place] [address]  Culture, blood (Routine X 2) w Reflex to ID Panel     Status: None   Collection Time: 08/21/22  8:58 PM   Specimen: BLOOD  Result Value Ref Range Status   Specimen Description BLOOD LEFT WRIST  Final   Special Requests   Final    BOTTLES DRAWN AEROBIC AND ANAEROBIC Blood Culture adequate volume   Culture   Final    NO GROWTH 5 DAYS Performed at [facility], [address] [place], [place]  [address]    Report Status 08/26/2022 FINAL  Final  Culture, blood (Routine X 2) w Reflex to ID Panel     Status: None   Collection Time: 08/21/22 11:01 PM   Specimen: BLOOD  Result Value Ref Range Status   Specimen Description BLOOD BLOOD LEFT ARM  Final   Special Requests   Final    BOTTLES DRAWN AEROBIC AND ANAEROBIC Blood Culture adequate volume   Culture   Final    NO GROWTH 5 DAYS Performed at [facility], [address] [place], [place] [address]    Report Status 08/26/2022 FINAL  Final  Expectorated Sputum Assessment w Gram Stain, Rflx to Resp Cult     Status: None   Collection Time: 08/22/22 11:22 AM   Specimen: Sputum  Result Value Ref Range Status   Specimen Description SPUTUM  Final   Special Requests EXPSU  Final   Sputum evaluation   Final    THIS SPECIMEN IS ACCEPTABLE FOR SPUTUM CULTURE Performed at [facility], [address] [place], [place] [address]    Report Status 08/22/2022 FINAL  Final  Culture, Respiratory w Gram Stain     Status: None   Collection Time: 08/22/22 11:22 AM   Specimen: SPU  Result Value Ref Range Status   Specimen Description   Final    SPUTUM Performed at [facility], [address] [place], [place] [address]    Special Requests   Final    EXPSU Reflexed from [id number] Performed at [facility] [address] [place], [place] [address]    Gram Stain   Final    ABUNDANT SQUAMOUS EPITHELIAL CELLS PRESENT FEW WBC PRESENT, PREDOMINANTLY PMN ABUNDANT GRAM VARIABLE ROD ABUNDANT GRAM POSITIVE COCCI IN PAIRS    Culture   Final    Normal respiratory flora-no Staph aureus or Pseudomonas seen Performed at [facility], [address], [place], [place] [address]    Report Status 08/25/2022 FINAL  Final    Coagulation Studies: No results for input(s): "LABPROT", "INR" in the last 72 hours.  Urinalysis: No results for input(s): "COLORURINE", "LABSPEC", "PHURINE", "GLUCOSEU", "HGBUR",  "BILIRUBINUR", "KETONESUR", "PROTEINUR", "UROBILINOGEN", "NITRITE", "LEUKOCYTESUR" in the last 72 hours.  Invalid input(s): "APPERANCEUR"     Imaging: No results found.   Medications:    ceFAZolin 2 g (08/31/22 1102)    apixaban  2.5 mg Oral BID   Chlorhexidine Gluconate Cloth  6 each Topical Daily   cyanocobalamin  1,000 mcg Oral Daily  diltiazem  360 mg Oral Daily   feeding supplement  237 mL Oral BID BM   furosemide  40 mg Oral BID   metoprolol succinate  100 mg Oral Daily   multivitamins with iron  1 tablet Oral Daily   pantoprazole  40 mg Oral Daily   polyethylene glycol  17 g Oral Daily   rosuvastatin  40 mg Oral Daily   senna  1 tablet Oral Daily   acetaminophen, albuterol, dextromethorphan-guaiFENesin, melatonin, ondansetron (ZOFRAN) IV, simethicone, sodium chloride, traMADol  Assessment/ Plan:  [name] is a 87 y.o.  male with recent prolonged admission for aortic ulcer s/p endovascular repair 07/24/22, MSSA bacteremia with L spine osteomyelitis and discitis, chronic diastolic heart failure, hypertension, hyperlipidemia, coronary artery disease, atrial fibrillation who was admitted to [facility] on 08/18/2022 for HCAP (healthcare-associated pneumonia) [J18.9] Acute on chronic respiratory failure with hypoxia (HCC) [J96.21] Sepsis, due to unspecified organism, unspecified whether acute organ dysfunction present (HCC) [A41.9]     1.  Acute kidney injury with proteinuria and hematuria: Baseline creatinine of 1.15 with normal GFR on 5/29. Serologic work up pending. Serum complements normal.  ATN and prerenal azotemia from overdiuresis. No obstruction on renal ultrasound. No recent IV contrast exposure. Nonoliguric urine output. No acute indication for dialysis.  - holding lisinopril - Creatinine 2.31, may represent new baseline on diuretic therapy - Continue oral furosemide   2.  Acute on chronic diastolic heart failure.  Ejection fraction 55 to 60% with grade 1  diastolic dysfunction noted.  -Continue furosemide 40mg  BID -Completed Albumin - appreciate cardiology input.   3.  Anemia with acute kidney injury: PRBC transfusion on 6/18. Hgb 8.4    LOS: 13   6/29/202411:57 AM

## 2022-08-31 NOTE — Plan of Care (Signed)
°  Problem: Education: °Goal: Ability to demonstrate management of disease process will improve °Outcome: Progressing °Goal: Ability to verbalize understanding of medication therapies will improve °Outcome: Progressing °Goal: Individualized Educational Video(s) °Outcome: Progressing °  °Problem: Activity: °Goal: Capacity to carry out activities will improve °Outcome: Progressing °  °Problem: Cardiac: °Goal: Ability to achieve and maintain adequate cardiopulmonary perfusion will improve °Outcome: Progressing °  °Problem: Activity: °Goal: Ability to tolerate increased activity will improve °Outcome: Progressing °  °Problem: Clinical Measurements: °Goal: Ability to maintain a body temperature in the normal range will improve °Outcome: Progressing °  °Problem: Respiratory: °Goal: Ability to maintain adequate ventilation will improve °Outcome: Progressing °Goal: Ability to maintain a clear airway will improve °Outcome: Progressing °  °Problem: Education: °Goal: Knowledge of General Education information will improve °Description: Including pain rating scale, medication(s)/side effects and non-pharmacologic comfort measures °Outcome: Progressing °  °Problem: Health Behavior/Discharge Planning: °Goal: Ability to manage health-related needs will improve °Outcome: Progressing °  °Problem: Clinical Measurements: °Goal: Ability to maintain clinical measurements within normal limits will improve °Outcome: Progressing °Goal: Will remain free from infection °Outcome: Progressing °Goal: Diagnostic test results will improve °Outcome: Progressing °Goal: Respiratory complications will improve °Outcome: Progressing °Goal: Cardiovascular complication will be avoided °Outcome: Progressing °  °Problem: Activity: °Goal: Risk for activity intolerance will decrease °Outcome: Progressing °  °Problem: Nutrition: °Goal: Adequate nutrition will be maintained °Outcome: Progressing °  °Problem: Coping: °Goal: Level of anxiety will  decrease °Outcome: Progressing °  °Problem: Elimination: °Goal: Will not experience complications related to bowel motility °Outcome: Progressing °Goal: Will not experience complications related to urinary retention °Outcome: Progressing °  °Problem: Pain Managment: °Goal: General experience of comfort will improve °Outcome: Progressing °  °Problem: Safety: °Goal: Ability to remain free from injury will improve °Outcome: Progressing °  °Problem: Skin Integrity: °Goal: Risk for impaired skin integrity will decrease °Outcome: Progressing °  °

## 2022-09-01 DIAGNOSIS — J9601 Acute respiratory failure with hypoxia: Secondary | ICD-10-CM | POA: Diagnosis not present

## 2022-09-01 LAB — BASIC METABOLIC PANEL WITH GFR
Anion gap: 10 (ref 5–15)
BUN: 93 mg/dL — ABNORMAL HIGH (ref 8–23)
CO2: 25 mmol/L (ref 22–32)
Calcium: 8.1 mg/dL — ABNORMAL LOW (ref 8.9–10.3)
Chloride: 99 mmol/L (ref 98–111)
Creatinine, Ser: 2.65 mg/dL — ABNORMAL HIGH (ref 0.61–1.24)
GFR, Estimated: 23 mL/min — ABNORMAL LOW
Glucose, Bld: 132 mg/dL — ABNORMAL HIGH (ref 70–99)
Potassium: 3.7 mmol/L (ref 3.5–5.1)
Sodium: 134 mmol/L — ABNORMAL LOW (ref 135–145)

## 2022-09-01 MED ORDER — FUROSEMIDE 40 MG PO TABS
40.0000 mg | ORAL_TABLET | Freq: Every day | ORAL | Status: DC
  Administered 2022-09-02 – 2022-09-04 (×3): 40 mg via ORAL
  Filled 2022-09-01 (×3): qty 1

## 2022-09-01 NOTE — Progress Notes (Signed)
[facility]  ROUNDING NOTE   Subjective:   Patient sitting up in bed Wife at bedside  2L [place] Lower extremity edema improving  Creatinine 2.65 (2.31) (2.18) (2.40) UOP 2L  Objective:  Vital signs in last 24 hours:  Temp:  [97.7 F (36.5 C)-98.4 F (36.9 C)] 98.4 F (36.9 C) (06/30 1157) Pulse Rate:  [66-75] 72 (06/30 1157) Resp:  [14-20] 18 (06/30 1157) BP: (126-160)/(61-76) 126/61 (06/30 1157) SpO2:  [97 %-100 %] 98 % (06/30 1157) Weight:  [91.4 kg] 91.4 kg (06/30 0447)  Weight change:  Filed Weights   08/28/22 0434 08/30/22 0437 09/01/22 0447  Weight: 84 kg 84.8 kg 91.4 kg    Intake/Output: I/O last 3 completed shifts: In: 480 [P.O.:480] Out: 3300 [Urine:3300]   Intake/Output this shift:  No intake/output data recorded.  Physical Exam: General: NAD, laying in bed  Head: Normocephalic, atraumatic. Moist oral mucosal membranes  Eyes: Anicteric  Lungs:  Diminished, 2L [place]  Heart: Regular rate and rhythm  Abdomen:  Soft, nontender  Extremities:  ++ peripheral edema.  Neurologic: Nonfocal, moving all four extremities  Skin: No lesions  Access: none    Basic Metabolic Panel: Recent Labs  Lab 08/26/22 0440 08/27/22 0454 08/28/22 1205 08/30/22 0640 09/01/22 0500  NA 140 137 134* 133* 134*  K 3.5 3.5 3.6 3.6 3.7  CL 103 99 96* 97* 99  CO2 25 27 28 26 25   GLUCOSE 138* 126* 125* 105* 132*  BUN 81* 89* 92* 97* 93*  CREATININE 2.45* 2.40* 2.18* 2.31* 2.65*  CALCIUM 8.3* 8.4* 8.1* 8.2* 8.1*     Liver Function Tests: No results for input(s): "AST", "ALT", "ALKPHOS", "BILITOT", "PROT", "ALBUMIN" in the last 168 hours.  No results for input(s): "LIPASE", "AMYLASE" in the last 168 hours. No results for input(s): "AMMONIA" in the last 168 hours.  CBC: Recent Labs  Lab 08/26/22 0440 08/27/22 0454 08/28/22 0415 08/29/22 0500 08/30/22 0640  WBC 9.2 10.1 19.1* 24.6* 18.3*  NEUTROABS  --   --   --   --  16.9*  HGB 8.6* 8.3* 8.8* 8.7* 8.4*   HCT 26.8* 26.2* 27.1* 26.6* 25.8*  MCV 93.1 93.2 91.6 93.0 92.1  PLT 251 248 264 253 250     Cardiac Enzymes: No results for input(s): "CKTOTAL", "CKMB", "CKMBINDEX", "TROPONINI" in the last 168 hours.   BNP: Invalid input(s): "POCBNP"  CBG: No results for input(s): "GLUCAP" in the last 168 hours.   Microbiology: Results for orders placed or performed during the hospital encounter of 08/18/22  Blood culture (routine x 2)     Status: None   Collection Time: 08/18/22 10:45 AM   Specimen: BLOOD LEFT ARM  Result Value Ref Range Status   Specimen Description BLOOD LEFT ARM  Final   Special Requests   Final    BOTTLES DRAWN AEROBIC AND ANAEROBIC Blood Culture adequate volume   Culture   Final    NO GROWTH 5 DAYS Performed at [facility], [address] [place], [place] [address]    Report Status 08/23/2022 FINAL  Final  Resp Panel by RT-PCR (Flu A&B, Covid) Anterior Nasal Swab     Status: None   Collection Time: 08/18/22 10:46 AM   Specimen: Anterior Nasal Swab  Result Value Ref Range Status   SARS Coronavirus 2 by RT PCR NEGATIVE NEGATIVE Final    Comment: (NOTE) SARS-CoV-2 target nucleic acids are NOT DETECTED.  The SARS-CoV-2 RNA is generally detectable in upper respiratory specimens during  the acute phase of infection. The lowest concentration of SARS-CoV-2 viral copies this assay can detect is 138 copies/mL. A negative result does not preclude SARS-Cov-2 infection and should not be used as the sole basis for treatment or other patient management decisions. A negative result may occur with  improper specimen collection/handling, submission of specimen other than nasopharyngeal swab, presence of viral mutation(s) within the areas targeted by this assay, and inadequate number of viral copies(<138 copies/mL). A negative result must be combined with clinical observations, patient history, and epidemiological information. The expected result is  Negative.  Fact Sheet for Patients:  BloggerCourse.com  Fact Sheet for Healthcare Providers:  SeriousBroker.it  This test is no t yet approved or cleared by the [place] FDA and  has been authorized for detection and/or diagnosis of SARS-CoV-2 by FDA under an Emergency Use Authorization (EUA). This EUA will remain  in effect (meaning this test can be used) for the duration of the COVID-19 declaration under Section 564(b)(1) of the Act, 21 U.S.C.section 360bbb-3(b)(1), unless the authorization is terminated  or revoked sooner.       Influenza A by PCR NEGATIVE NEGATIVE Final   Influenza B by PCR NEGATIVE NEGATIVE Final    Comment: (NOTE) The Xpert Xpress SARS-CoV-2/FLU/RSV plus assay is intended as an aid in the diagnosis of influenza from Nasopharyngeal swab specimens and should not be used as a sole basis for treatment. Nasal washings and aspirates are unacceptable for Xpert Xpress SARS-CoV-2/FLU/RSV testing.  Fact Sheet for Patients: BloggerCourse.com  Fact Sheet for Healthcare Providers: SeriousBroker.it  This test is not yet approved or cleared by the [place] FDA and has been authorized for detection and/or diagnosis of SARS-CoV-2 by FDA under an Emergency Use Authorization (EUA). This EUA will remain in effect (meaning this test can be used) for the duration of the COVID-19 declaration under Section 564(b)(1) of the Act, 21 U.S.C. section 360bbb-3(b)(1), unless the authorization is terminated or revoked.  Performed at [facility], [address] [place], [place] [address]   Culture, blood (Routine X 2) w Reflex to ID Panel     Status: None   Collection Time: 08/18/22  9:19 PM   Specimen: BLOOD LEFT ARM  Result Value Ref Range Status   Specimen Description BLOOD LEFT ARM  Final   Special Requests   Final    BOTTLES DRAWN AEROBIC AND ANAEROBIC  Blood Culture adequate volume   Culture   Final    NO GROWTH 5 DAYS Performed at [facility], [address] [place], [place] [address]    Report Status 08/23/2022 FINAL  Final  Respiratory (~20 pathogens) panel by PCR     Status: None   Collection Time: 08/21/22  1:50 PM   Specimen: Nasopharyngeal Swab; Respiratory  Result Value Ref Range Status   Adenovirus NOT DETECTED NOT DETECTED Final   Coronavirus 229E NOT DETECTED NOT DETECTED Final    Comment: (NOTE) The Coronavirus on the Respiratory Panel, DOES NOT test for the novel  Coronavirus (2019 nCoV)    Coronavirus HKU1 NOT DETECTED NOT DETECTED Final   Coronavirus NL63 NOT DETECTED NOT DETECTED Final   Coronavirus OC43 NOT DETECTED NOT DETECTED Final   Metapneumovirus NOT DETECTED NOT DETECTED Final   Rhinovirus / Enterovirus NOT DETECTED NOT DETECTED Final   Influenza A NOT DETECTED NOT DETECTED Final   Influenza B NOT DETECTED NOT DETECTED Final   Parainfluenza Virus 1 NOT DETECTED NOT DETECTED Final   Parainfluenza Virus 2 NOT  DETECTED NOT DETECTED Final   Parainfluenza Virus 3 NOT DETECTED NOT DETECTED Final   Parainfluenza Virus 4 NOT DETECTED NOT DETECTED Final   Respiratory Syncytial Virus NOT DETECTED NOT DETECTED Final   Bordetella pertussis NOT DETECTED NOT DETECTED Final   Bordetella Parapertussis NOT DETECTED NOT DETECTED Final   Chlamydophila pneumoniae NOT DETECTED NOT DETECTED Final   Mycoplasma pneumoniae NOT DETECTED NOT DETECTED Final    Comment: Performed at [facility], [address], [place], [place] [address]  Culture, blood (Routine X 2) w Reflex to ID Panel     Status: None   Collection Time: 08/21/22  8:58 PM   Specimen: BLOOD  Result Value Ref Range Status   Specimen Description BLOOD LEFT WRIST  Final   Special Requests   Final    BOTTLES DRAWN AEROBIC AND ANAEROBIC Blood Culture adequate volume   Culture   Final    NO GROWTH 5 DAYS Performed at [facility], [address] [place], [place] [address]    Report Status 08/26/2022 FINAL  Final  Culture, blood (Routine X 2) w Reflex to ID Panel     Status: None   Collection Time: 08/21/22 11:01 PM   Specimen: BLOOD  Result Value Ref Range Status   Specimen Description BLOOD BLOOD LEFT ARM  Final   Special Requests   Final    BOTTLES DRAWN AEROBIC AND ANAEROBIC Blood Culture adequate volume   Culture   Final    NO GROWTH 5 DAYS Performed at [facility], [address], [place], [place] [address]    Report Status 08/26/2022 FINAL  Final  Expectorated Sputum Assessment w Gram Stain, Rflx to Resp Cult     Status: None   Collection Time: 08/22/22 11:22 AM   Specimen: Sputum  Result Value Ref Range Status   Specimen Description SPUTUM  Final   Special Requests EXPSU  Final   Sputum evaluation   Final    THIS SPECIMEN IS ACCEPTABLE FOR SPUTUM CULTURE Performed at [facility], [address] [place], [place] [address]    Report Status 08/22/2022 FINAL  Final  Culture, Respiratory w Gram Stain     Status: None   Collection Time: 08/22/22 11:22 AM   Specimen: SPU  Result Value Ref Range Status   Specimen Description   Final    SPUTUM Performed at [facility], [address] [place], [place] [address]    Special Requests   Final    EXPSU Reflexed from [id number] Performed at [facility], [address] [place], [place] [address]    Gram Stain   Final    ABUNDANT SQUAMOUS EPITHELIAL CELLS PRESENT FEW WBC PRESENT, PREDOMINANTLY PMN ABUNDANT GRAM VARIABLE ROD ABUNDANT GRAM POSITIVE COCCI IN PAIRS    Culture   Final    Normal respiratory flora-no Staph aureus or Pseudomonas seen Performed at [facility], [address], [place], [place] [address]    Report Status 08/25/2022 FINAL  Final    Coagulation Studies: No results for input(s): "LABPROT", "INR" in the last 72 hours.  Urinalysis: No results for input(s): "COLORURINE", "LABSPEC",  "PHURINE", "GLUCOSEU", "HGBUR", "BILIRUBINUR", "KETONESUR", "PROTEINUR", "UROBILINOGEN", "NITRITE", "LEUKOCYTESUR" in the last 72 hours.  Invalid input(s): "APPERANCEUR"     Imaging: No results found.   Medications:    ceFAZolin 2 g (09/01/22 0935)    apixaban  2.5 mg Oral BID   Chlorhexidine Gluconate Cloth  6 each Topical Daily  cyanocobalamin  1,000 mcg Oral Daily   diltiazem  360 mg Oral Daily   feeding supplement  237 mL Oral BID BM   [START ON 09/02/2022] furosemide  40 mg Oral Daily   metoprolol succinate  100 mg Oral Daily   multivitamins with iron  1 tablet Oral Daily   pantoprazole  40 mg Oral Daily   polyethylene glycol  17 g Oral Daily   rosuvastatin  40 mg Oral Daily   senna  1 tablet Oral Daily   acetaminophen, albuterol, dextromethorphan-guaiFENesin, melatonin, ondansetron (ZOFRAN) IV, simethicone, sodium chloride, traMADol  Assessment/ Plan:  [name] is a 87 y.o.  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  Acute kidney injury with proteinuria and hematuria: Baseline creatinine of 1.15 with normal GFR on 5/29. Serologic work up pending. Serum complements normal.  ATN and prerenal azotemia from overdiuresis. No obstruction on renal ultrasound. No recent IV contrast exposure. Nonoliguric urine output. No acute indication for dialysis.  - holding lisinopril - Creatinine elevated, 2.65 today - Good UOP - Will decrease oral furosemide to daily   2.  Acute on chronic diastolic heart failure.   Ejection fraction 55 to 60% with grade 1 diastolic dysfunction noted.  -Decreased diuretic to oral furosemide 40mg  daily -Completed Albumin - appreciate cardiology input.   3.  Anemia with acute kidney injury: PRBC transfusion on 6/18.     LOS: 14   6/30/20241:08 PM

## 2022-09-01 NOTE — Progress Notes (Signed)
PROGRESS NOTE    Brett Riley  [name] DOB: 03-11-1935 DOA: 08/18/2022 PCP: Dale Elk Horn, MD    Brief Narrative:  87 y.o. male with medical history significant of chronic dCHF, HTN, HLD, CAD, CKD-3a, anemia, chronic A fib on Eliquis, who presents 08/18/2022 to ED from rehab, chief complaint SOB.  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 Net IO Since Admission: -5,549.14 mL [08/20/22 1608]. Heparin stopped d/t rectal bleeding overnight.  06/19: euvolemic per cardio, d/c lasix, O2 requirement still high, CT chest reviewed, ?edema/atypical infection, given clinically worsening respiratory status have asked pulmonary and ID to consult. Have d/c amiodarone and ordered steroids in case amio toxicity.  06/20: ID and pulmonary assisting with the management.   6/22-6/25: patient feels clinically improved. Stable with oxygen via [place] and weaned down to 2-3L. Patient requiring judicial adjustments to balance between hypervolemia and renal injury with diuresis. Unable to pull fluid from lungs with thoracentesis so medical management is best option. His IV Abx to treat his vertebral osteomyelitis from previous admission continue unchanged and managed by ID.   6/28: Bed offered at [facility].  Authorization pending   Assessment & Plan:   Principal Problem:   Acute respiratory failure with hypoxia (HCC) Active Problems:   Acute on chronic heart failure with preserved ejection fraction (HFpEF) (HCC)   MSSA bacteremia   Vertebral osteomyelitis (HCC)   CAD (coronary artery disease)    Myocardial injury   Hypertension   Hyperlipidemia   Chronic kidney disease, stage 3a (HCC)   Normocytic anemia   Atrial fibrillation, chronic (HCC)   Paroxysmal atrial fibrillation (HCC)   HCAP (healthcare-associated pneumonia)   Sepsis (HCC)   Pleural effusion   Amiodarone pulmonary toxicity   Acute on chronic HFrEF (heart failure with reduced ejection fraction) (HCC)   Pleural effusion on right   Status post thoracentesis   Heart failure with preserved ejection fraction (HCC)   Stage 3 chronic kidney disease (HCC)  Acute on chronic respiratory failure with hypoxia due to acute on chronic CHF  possible pneumonia, bilateral small pleural effusion- as seen on chest CT Not on oxygen supplementation at baseline Currently on 3L with O2 saturation in upper 90s. Plan: - wean as tolerated - f/u sputum culture results-no growth -Completed course of azithromycin. - Continue Ancef as written for his osteomyelitis.  Last dose 7/12 - incentive spirometer q1hr - wean steroids -Medically stable for DC.  Plan for discharge 7/1   Acute on chronic diastolic CHF- improving. Down about 5.2kg since 6/16. Echo 6/17: LVEF of 50-55% with RV systolic function is normal. BNP 673. Plan: Lasix 40 mg p.o. twice daily TED hose Strict I's and O's, daily weights   Pleural effusion- IR unable to perform thoracentesis 6/20, 6/21. Medical management. Clinically improving respiratory status.     Atrial fibrillation, chronic (HCC): Heart rate now well controlled in 60-70s diltiazem, metoprolol per cardiology Lisinopril held with AKI Restarted eliquis (renally dosed) in setting of no new bleeding and patient's hgb has been stable >5 days after concern for GI bleed early in admission on heparin gtt.   Recent hx  of MSSA bacteremia vertebral osteomyelitis (HCC): continue IV Ancef until 7/12. Follow up with ID outpatient 7/11 repeat Blood culture NGTD   CAD  Myocardial injury  HLD:  trop  258, possibly due  to demand ischemia secondary to CHF exacerbation.  Patient does not have chest pain. Crestor, beta blocker, Aspirin 81 discontinued as he's on eliquis  Cardiology following  May need coronary angiography at some point but will defer for now     AKI on Chronic kidney disease, stage 3a [facility]): Cr stable 2.4>2.4 even with use of diuretics. Baseline creatinine 1.5-1.9 Follow BMP Nephrology following    Normocytic anemia: hgb stable around 8.5-9. S/p 1 unit PRBC 06/18 ABLA w/ rectal bleed   Hypertension Continue to closely monitor vital signs.   Resolved hypokalemia   GERD PPI    Right knee pain -X-ray with chronic arthritic changes.  Recommend frequent mobility   DVT prophylaxis: Eliquis Code Status: DNR Family Communication: Spouse at bedside 6/26, 6/27, 6/28, 6/30 Disposition Plan: Status is: Inpatient Remains inpatient appropriate because: Pending placement.  Unsafe discharge plan   Level of care: Stepdown  Consultants:  Cardiology Nephrology  Procedures:  None  Antimicrobials: Ancef   Subjective: Seen and examined.  Sitting up in bed.  Eating breakfast.  Wife at bedside  Objective: Vitals:   09/01/22 0058 09/01/22 0437 09/01/22 0447 09/01/22 0823  BP: 138/65 139/64  (!) 160/76  Pulse: 70 73  75  Resp: 20 18  14   Temp: 98.1 F (36.7 C) 97.7 F (36.5 C)  97.9 F (36.6 C)  TempSrc:      SpO2: 98% 97%  97%  Weight:   91.4 kg   Height:        Intake/Output Summary (Last 24 hours) at 09/01/2022 0925 Last data filed at 09/01/2022 0058 Gross per 24 hour  Intake 240 ml  Output 2000 ml  Net -1760 ml   Filed Weights   08/28/22 0434 08/30/22 0437 09/01/22 0447  Weight: 84 kg 84.8 kg 91.4 kg    Examination:  General exam: No acute distress.  More awake.  Sitting up in bed eating Respiratory system: Right-sided crackles.  Normal work of breathing.  3 L Cardiovascular system: S1-S2, RRR, no murmurs, no pedal edema Gastrointestinal system: Soft, NT/ND,  normal bowel sounds Central nervous system: Alert and oriented. No focal neurological deficits. Extremities: Right lower extremity decreased ROM Skin: No rashes, lesions or ulcers Psychiatry: Judgement and insight appear normal. Mood & affect flattened.     Data Reviewed: I have personally reviewed following labs and imaging studies  CBC: Recent Labs  Lab 08/26/22 0440 08/27/22 0454 08/28/22 0415 08/29/22 0500 08/30/22 0640  WBC 9.2 10.1 19.1* 24.6* 18.3*  NEUTROABS  --   --   --   --  16.9*  HGB 8.6* 8.3* 8.8* 8.7* 8.4*  HCT 26.8* 26.2* 27.1* 26.6* 25.8*  MCV 93.1 93.2 91.6 93.0 92.1  PLT 251 248 264 253 250   Basic Metabolic Panel: Recent Labs  Lab 08/26/22 0440 08/27/22 0454 08/28/22 1205 08/30/22 0640 09/01/22 0500  NA 140 137 134* 133* 134*  K 3.5 3.5 3.6 3.6 3.7  CL 103 99 96* 97* 99  CO2 25 27 28 26 25   GLUCOSE 138* 126* 125* 105* 132*  BUN 81* 89* 92* 97* 93*  CREATININE 2.45* 2.40* 2.18* 2.31* 2.65*  CALCIUM 8.3* 8.4* 8.1* 8.2* 8.1*   GFR: Estimated Creatinine Clearance: 22.8 mL/min (A) (by C-G formula based on SCr of 2.65 mg/dL (  H)). Liver Function Tests: No results for input(s): "AST", "ALT", "ALKPHOS", "BILITOT", "PROT", "ALBUMIN" in the last 168 hours.  No results for input(s): "LIPASE", "AMYLASE" in the last 168 hours. No results for input(s): "AMMONIA" in the last 168 hours. Coagulation Profile: No results for input(s): "INR", "PROTIME" in the last 168 hours. Cardiac Enzymes: No results for input(s): "CKTOTAL", "CKMB", "CKMBINDEX", "TROPONINI" in the last 168 hours.  BNP (last 3 results) No results for input(s): "PROBNP" in the last 8760 hours. HbA1C: No results for input(s): "HGBA1C" in the last 72 hours. CBG: No results for input(s): "GLUCAP" in the last 168 hours. Lipid Profile: No results for input(s): "CHOL", "HDL", "LDLCALC", "TRIG", "CHOLHDL", "LDLDIRECT" in the last 72 hours. Thyroid Function Tests: No results for input(s): "TSH",  "T4TOTAL", "FREET4", "T3FREE", "THYROIDAB" in the last 72 hours. Anemia Panel: No results for input(s): "VITAMINB12", "FOLATE", "FERRITIN", "TIBC", "IRON", "RETICCTPCT" in the last 72 hours. Sepsis Labs: No results for input(s): "PROCALCITON", "LATICACIDVEN" in the last 168 hours.   Recent Results (from the past 240 hour(s))  Expectorated Sputum Assessment w Gram Stain, Rflx to Resp Cult     Status: None   Collection Time: 08/22/22 11:22 AM   Specimen: Sputum  Result Value Ref Range Status   Specimen Description SPUTUM  Final   Special Requests EXPSU  Final   Sputum evaluation   Final    THIS SPECIMEN IS ACCEPTABLE FOR SPUTUM CULTURE Performed at [facility], [address] [place], [place] [address]    Report Status 08/22/2022 FINAL  Final  Culture, Respiratory w Gram Stain     Status: None   Collection Time: 08/22/22 11:22 AM   Specimen: SPU  Result Value Ref Range Status   Specimen Description   Final    SPUTUM Performed at [facility], [address], [place], [place] [address]    Special Requests   Final    EXPSU Reflexed from [id number] Performed at [facility], [address] [place], [place] [address]    Gram Stain   Final    ABUNDANT SQUAMOUS EPITHELIAL CELLS PRESENT FEW WBC PRESENT, PREDOMINANTLY PMN ABUNDANT GRAM VARIABLE ROD ABUNDANT GRAM POSITIVE COCCI IN PAIRS    Culture   Final    Normal respiratory flora-no Staph aureus or Pseudomonas seen Performed at [facility], [address], [place], [place] [address]    Report Status 08/25/2022 FINAL  Final         Radiology Studies: No results found.      Scheduled Meds:  apixaban  2.5 mg Oral BID   Chlorhexidine Gluconate Cloth  6 each Topical Daily   cyanocobalamin  1,000 mcg Oral Daily   diltiazem  360 mg Oral Daily   feeding supplement  237 mL Oral BID BM   [START ON 09/02/2022] furosemide  40 mg Oral Daily   metoprolol succinate  100 mg Oral Daily    multivitamins with iron  1 tablet Oral Daily   pantoprazole  40 mg Oral Daily   polyethylene glycol  17 g Oral Daily   rosuvastatin  40 mg Oral Daily   senna  1 tablet Oral Daily   Continuous Infusions:  ceFAZolin 2 g (08/31/22 2128)     LOS: 14 days     Tresa Moore, MD [facility]   If 7PM-7AM, please contact night-coverage  09/01/2022, 9:25 AM

## 2022-09-01 NOTE — Plan of Care (Signed)
°  Problem: Education: °Goal: Ability to demonstrate management of disease process will improve °Outcome: Progressing °Goal: Ability to verbalize understanding of medication therapies will improve °Outcome: Progressing °Goal: Individualized Educational Video(s) °Outcome: Progressing °  °Problem: Activity: °Goal: Capacity to carry out activities will improve °Outcome: Progressing °  °Problem: Cardiac: °Goal: Ability to achieve and maintain adequate cardiopulmonary perfusion will improve °Outcome: Progressing °  °Problem: Activity: °Goal: Ability to tolerate increased activity will improve °Outcome: Progressing °  °Problem: Clinical Measurements: °Goal: Ability to maintain a body temperature in the normal range will improve °Outcome: Progressing °  °Problem: Respiratory: °Goal: Ability to maintain adequate ventilation will improve °Outcome: Progressing °Goal: Ability to maintain a clear airway will improve °Outcome: Progressing °  °Problem: Education: °Goal: Knowledge of General Education information will improve °Description: Including pain rating scale, medication(s)/side effects and non-pharmacologic comfort measures °Outcome: Progressing °  °Problem: Health Behavior/Discharge Planning: °Goal: Ability to manage health-related needs will improve °Outcome: Progressing °  °Problem: Clinical Measurements: °Goal: Ability to maintain clinical measurements within normal limits will improve °Outcome: Progressing °Goal: Will remain free from infection °Outcome: Progressing °Goal: Diagnostic test results will improve °Outcome: Progressing °Goal: Respiratory complications will improve °Outcome: Progressing °Goal: Cardiovascular complication will be avoided °Outcome: Progressing °  °Problem: Activity: °Goal: Risk for activity intolerance will decrease °Outcome: Progressing °  °Problem: Nutrition: °Goal: Adequate nutrition will be maintained °Outcome: Progressing °  °Problem: Coping: °Goal: Level of anxiety will  decrease °Outcome: Progressing °  °Problem: Elimination: °Goal: Will not experience complications related to bowel motility °Outcome: Progressing °Goal: Will not experience complications related to urinary retention °Outcome: Progressing °  °Problem: Pain Managment: °Goal: General experience of comfort will improve °Outcome: Progressing °  °Problem: Safety: °Goal: Ability to remain free from injury will improve °Outcome: Progressing °  °Problem: Skin Integrity: °Goal: Risk for impaired skin integrity will decrease °Outcome: Progressing °  °

## 2022-09-02 DIAGNOSIS — J9601 Acute respiratory failure with hypoxia: Secondary | ICD-10-CM | POA: Diagnosis not present

## 2022-09-02 LAB — CBC WITH DIFFERENTIAL/PLATELET
Abs Immature Granulocytes: 0.06 10*3/uL (ref 0.00–0.07)
Basophils Absolute: 0 10*3/uL (ref 0.0–0.1)
Basophils Relative: 0 %
Eosinophils Absolute: 0.2 10*3/uL (ref 0.0–0.5)
Eosinophils Relative: 2 %
HCT: 27.4 % — ABNORMAL LOW (ref 39.0–52.0)
Hemoglobin: 8.9 g/dL — ABNORMAL LOW (ref 13.0–17.0)
Immature Granulocytes: 1 %
Lymphocytes Relative: 10 %
Lymphs Abs: 0.8 10*3/uL (ref 0.7–4.0)
MCH: 29.9 pg (ref 26.0–34.0)
MCHC: 32.5 g/dL (ref 30.0–36.0)
MCV: 91.9 fL (ref 80.0–100.0)
Monocytes Absolute: 0.3 10*3/uL (ref 0.1–1.0)
Monocytes Relative: 3 %
Neutro Abs: 6.7 10*3/uL (ref 1.7–7.7)
Neutrophils Relative %: 84 %
Platelets: 236 10*3/uL (ref 150–400)
RBC: 2.98 MIL/uL — ABNORMAL LOW (ref 4.22–5.81)
RDW: 14.4 % (ref 11.5–15.5)
WBC: 8 10*3/uL (ref 4.0–10.5)
nRBC: 0 % (ref 0.0–0.2)

## 2022-09-02 LAB — BASIC METABOLIC PANEL WITH GFR
Anion gap: 12 (ref 5–15)
BUN: 100 mg/dL — ABNORMAL HIGH (ref 8–23)
CO2: 23 mmol/L (ref 22–32)
Calcium: 8.2 mg/dL — ABNORMAL LOW (ref 8.9–10.3)
Chloride: 99 mmol/L (ref 98–111)
Creatinine, Ser: 2.97 mg/dL — ABNORMAL HIGH (ref 0.61–1.24)
GFR, Estimated: 20 mL/min — ABNORMAL LOW
Glucose, Bld: 120 mg/dL — ABNORMAL HIGH (ref 70–99)
Potassium: 3.5 mmol/L (ref 3.5–5.1)
Sodium: 134 mmol/L — ABNORMAL LOW (ref 135–145)

## 2022-09-02 LAB — URINALYSIS, COMPLETE (UACMP) WITH MICROSCOPIC
Bilirubin Urine: NEGATIVE
Glucose, UA: NEGATIVE mg/dL
Ketones, ur: NEGATIVE mg/dL
Leukocytes,Ua: NEGATIVE
Nitrite: NEGATIVE
Protein, ur: 30 mg/dL — AB
Specific Gravity, Urine: 1.01 (ref 1.005–1.030)
Squamous Epithelial / HPF: NONE SEEN /HPF (ref 0–5)
pH: 5 (ref 5.0–8.0)

## 2022-09-02 LAB — ALBUMIN: Albumin: 2.2 g/dL — ABNORMAL LOW (ref 3.5–5.0)

## 2022-09-02 LAB — GLUCOSE, CAPILLARY: Glucose-Capillary: 92 mg/dL (ref 70–99)

## 2022-09-02 NOTE — Progress Notes (Signed)
Physical Therapy Treatment Patient Details Name: Brett Riley MRN: [id number] DOB: [birth year] Today's Date: 09/02/2022   History of Present Illness Sari Petrou is a 87 y/o M admitted with respiratory failure; other PMH includes MI, CHF, a-fib, HTN, anemia. Was at SNF doing rehab prior to admission; was previously admitted 5/18-6/7.    PT Comments    Patient oriented to self and family in room, reported 4/10 back pain. Session limited by pt fatigue and BM today. maxAx2 for all bed mobility, fair sitting  balance with BUE propped and extra time. Noted for R and posterior lean 90% of sitting. Sit <> stand with RW, maxAx2 and stabilization of RW as well. Unable to tolerate >30seconds and wide BOS. Pt placed on bed pan, nurse tech notified. The patient would benefit from further skilled PT intervention to continue to progress towards goals.      Recommendations for follow up therapy are one component of a multi-disciplinary discharge planning process, led by the attending physician.  Recommendations may be updated based on patient status, additional functional criteria and insurance authorization.  Follow Up Recommendations  Can patient physically be transported by private vehicle: No    Assistance Recommended at Discharge Frequent or constant Supervision/Assistance  Patient can return home with the following Assistance with cooking/housework;Assist for transportation;Two people to help with walking and/or transfers;Two people to help with bathing/dressing/bathroom;Help with stairs or ramp for entrance   Equipment Recommendations  None recommended by PT    Recommendations for Other Services       Precautions / Restrictions Precautions Precautions: Fall Restrictions Weight Bearing Restrictions: No     Mobility  Bed Mobility Overal bed mobility: Needs Assistance Bed Mobility: Supine to Sit, Sit to Supine, Rolling Rolling: Max assist, +2 for physical assistance   Supine to sit:  Max assist, +2 for physical assistance Sit to supine: Max assist, +2 for physical assistance   General bed mobility comments: increased time and effort required. cues for task initiation and sequencing    Transfers Overall transfer level: Needs assistance Equipment used: Rolling walker (2 wheels) Transfers: Sit to/from Stand Sit to Stand: Max assist           General transfer comment: assistance needed for RW stabilization as well. base of support (wide). noted for BM    Ambulation/Gait               General Gait Details: unable   Stairs             Wheelchair Mobility    Modified Rankin (Stroke Patients Only)       Balance Overall balance assessment: Needs assistance Sitting-balance support: Feet supported, Bilateral upper extremity supported, Single extremity supported Sitting balance-Leahy Scale: Poor Sitting balance - Comments: momentarily CGA for some sitting, predominantly min-modA due to R lean Postural control: Right lateral lean, Posterior lean Standing balance support: Bilateral upper extremity supported, Reliant on assistive device for balance Standing balance-Leahy Scale: Zero                              Cognition Arousal/Alertness: Awake/alert Behavior During Therapy: Flat affect Overall Cognitive Status: Within Functional Limits for tasks assessed                                 General Comments: [facility]        Exercises Other Exercises Other Exercises: rolling  b/l for hygeine due to BM x 2. max assist for hygeine.    General Comments        Pertinent Vitals/Pain Pain Assessment Pain Assessment: 0-10 Pain Score: 4  Pain Location: low back Pain Descriptors / Indicators: Guarding, Discomfort, Grimacing Pain Intervention(s): Limited activity within patient's tolerance, Monitored during session, Repositioned    Home Living                          Prior Function            PT Goals  (current goals can now be found in the care plan section) Progress towards PT goals: Progressing toward goals    Frequency    Min 2X/week      PT Plan Current plan remains appropriate    Co-evaluation PT/OT/SLP Co-Evaluation/Treatment: Yes Reason for Co-Treatment: Necessary to address cognition/behavior during functional activity;For patient/therapist safety;To address functional/ADL transfers PT goals addressed during session: Mobility/safety with mobility;Balance OT goals addressed during session: ADL's and self-care;Proper use of Adaptive equipment and DME      AM-PAC PT "6 Clicks" Mobility   Outcome Measure  Help needed turning from your back to your side while in a flat bed without using bedrails?: Total Help needed moving from lying on your back to sitting on the side of a flat bed without using bedrails?: Total Help needed moving to and from a bed to a chair (including a wheelchair)?: Total Help needed standing up from a chair using your arms (e.g., wheelchair or bedside chair)?: Total Help needed to walk in hospital room?: Total Help needed climbing 3-5 steps with a railing? : Total 6 Click Score: 6    End of Session Equipment Utilized During Treatment: Gait belt;Oxygen Activity Tolerance: Patient limited by fatigue Patient left: in bed;with call bell/phone within reach;with bed alarm set Nurse Communication: Other (comment) (bedpan) PT Visit Diagnosis: Muscle weakness (generalized) (M62.81);Difficulty in walking, not elsewhere classified (R26.2);Unsteadiness on feet (R26.81);Other abnormalities of gait and mobility (R26.89)     Time: 1610-9604 PT Time Calculation (min) (ACUTE ONLY): 25 min  Charges:  $Therapeutic Activity: 8-22 mins                    Olga Coaster PT, DPT 11:49 AM,09/02/22

## 2022-09-02 NOTE — Progress Notes (Signed)
PROGRESS NOTE    Brett Riley  [name] DOB: 1935/12/11 DOA: 08/18/2022 PCP: Dale Warrenton, MD    Brief Narrative:  87 y.o. male with medical history significant of chronic dCHF, HTN, HLD, CAD, CKD-3a, anemia, chronic A fib on Eliquis, who presents 08/18/2022 to ED from rehab, chief complaint SOB.  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 Net IO Since Admission: -5,549.14 mL [08/20/22 1608]. Heparin stopped d/t rectal bleeding overnight.  06/19: euvolemic per cardio, d/c lasix, O2 requirement still high, CT chest reviewed, ?edema/atypical infection, given clinically worsening respiratory status have asked pulmonary and ID to consult. Have d/c amiodarone and ordered steroids in case amio toxicity.  06/20: ID and pulmonary assisting with the management.   6/22-6/25: patient feels clinically improved. Stable with oxygen via [place] and weaned down to 2-3L. Patient requiring judicial adjustments to balance between hypervolemia and renal injury with diuresis. Unable to pull fluid from lungs with thoracentesis so medical management is best option. His IV Abx to treat his vertebral osteomyelitis from previous admission continue unchanged and managed by ID.   6/28: Bed offered at [facility].  Authorization pending 7/1: No status changes.  Per patient's daughter he has been seeming more depressed.  I have offered outpatient referral to psychiatry.   Assessment & Plan:   Principal Problem:   Acute respiratory failure with hypoxia (HCC) Active Problems:   Acute on chronic heart failure with  preserved ejection fraction (HFpEF) (HCC)   MSSA bacteremia   Vertebral osteomyelitis (HCC)   CAD (coronary artery disease)   Myocardial injury   Hypertension   Hyperlipidemia   Chronic kidney disease, stage 3a (HCC)   Normocytic anemia   Atrial fibrillation, chronic (HCC)   Paroxysmal atrial fibrillation (HCC)   HCAP (healthcare-associated pneumonia)   Sepsis (HCC)   Pleural effusion   Amiodarone pulmonary toxicity   Acute on chronic HFrEF (heart failure with reduced ejection fraction) (HCC)   Pleural effusion on right   Status post thoracentesis   Heart failure with preserved ejection fraction (HCC)   Stage 3 chronic kidney disease (HCC)  Acute on chronic respiratory failure with hypoxia due to acute on chronic CHF  possible pneumonia, bilateral small pleural effusion- as seen on chest CT Not on oxygen supplementation at baseline Currently on 3L with O2 saturation in upper 90s. Plan: - wean as tolerated - f/u sputum culture results-no growth -Completed course of azithromycin. - Continue Ancef as written for his osteomyelitis.  Last dose 7/12 - incentive spirometer q1hr - wean steroids -Medically stable for discharge.  As of 7/1 [facility]AC insurance authorization still pending   Acute on chronic diastolic CHF- improving. Down about 5.2kg since 6/16. Echo 6/17: LVEF of 50-55% with RV systolic function is normal. BNP 673. Plan: Lasix 40 mg daily TED hose Strict I's and O's, daily weights   Pleural effusion- IR unable to perform thoracentesis 6/20, 6/21. Medical management. Clinically improving respiratory status.     Atrial fibrillation, chronic (HCC): Heart rate now well controlled in 60-70s diltiazem, metoprolol per cardiology Lisinopril held with AKI Restarted eliquis (renally dosed) in setting of no new  bleeding and patient's hgb has been stable >5 days after concern for GI bleed early in admission on heparin gtt.   Recent hx of MSSA bacteremia vertebral osteomyelitis  (HCC): continue IV Ancef until 7/12. Follow up with ID outpatient 7/11 repeat Blood culture NGTD   CAD  Myocardial injury  HLD:  trop  258, possibly due to demand ischemia secondary to CHF exacerbation.  Patient does not have chest pain. Crestor, beta blocker, Aspirin 81 discontinued as he's on eliquis  Cardiology following  May need coronary angiography at some point but will defer for now     AKI on Chronic kidney disease, stage 3a [facility]): Cr stable 2.4>2.4 even with use of diuretics. Baseline creatinine 1.5-1.9 Follow BMP Nephrology following    Normocytic anemia: hgb stable around 8.5-9. S/p 1 unit PRBC 06/18 ABLA w/ rectal bleed   Hypertension Continue to closely monitor vital signs.   Resolved hypokalemia   GERD PPI    Right knee pain -X-ray with chronic arthritic changes.  Recommend frequent mobility   DVT prophylaxis: Eliquis Code Status: DNR Family Communication: Spouse at bedside 6/26, 6/27, 6/28, 6/30, 7/1 Disposition Plan: Status is: Inpatient Remains inpatient appropriate because: Pending placement.  Unsafe discharge plan   Level of care: Stepdown  Consultants:  Cardiology Nephrology  Procedures:  None  Antimicrobials: Ancef   Subjective: Seen and examined.  Resting in bed.  Flattened affect otherwise no distress.  Objective: Vitals:   09/01/22 2314 09/02/22 0428 09/02/22 0546 09/02/22 0826  BP: (!) 140/72 (!) 151/72  (!) 141/70  Pulse: 73 69  71  Resp: 20 18  18   Temp: 98.3 F (36.8 C) (!) 97.5 F (36.4 C)  97.7 F (36.5 C)  TempSrc:      SpO2: 94% 93%  93%  Weight:   90.1 kg   Height:        Intake/Output Summary (Last 24 hours) at 09/02/2022 1139 Last data filed at 09/02/2022 1000 Gross per 24 hour  Intake 270 ml  Output 1300 ml  Net -1030 ml   Filed Weights   08/30/22 0437 09/01/22 0447 09/02/22 0546  Weight: 84.8 kg 91.4 kg 90.1 kg    Examination:  General exam: NAD.  Lying in bed.  Appears frail Respiratory system:  Right-sided crackles.  Normal work of breathing.  3 L Cardiovascular system: S1-S2, RRR, no murmurs, no pedal edema Gastrointestinal system: Soft, NT/ND, normal bowel sounds Central nervous system: Alert and oriented. No focal neurological deficits. Extremities: Right lower extremity decreased ROM Skin: No rashes, lesions or ulcers Psychiatry: Judgement and insight appear normal. Mood & affect flattened.     Data Reviewed: I have personally reviewed following labs and imaging studies  CBC: Recent Labs  Lab 08/27/22 0454 08/28/22 0415 08/29/22 0500 08/30/22 0640 09/02/22 0930  WBC 10.1 19.1* 24.6* 18.3* 8.0  NEUTROABS  --   --   --  16.9* 6.7  HGB 8.3* 8.8* 8.7* 8.4* 8.9*  HCT 26.2* 27.1* 26.6* 25.8* 27.4*  MCV 93.2 91.6 93.0 92.1 91.9  PLT 248 264 253 250 236   Basic Metabolic Panel: Recent Labs  Lab 08/27/22 0454 08/28/22 1205 08/30/22 0640 09/01/22 0500 09/02/22 0930  NA 137 134* 133* 134* 134*  K 3.5 3.6 3.6 3.7 3.5  CL 99 96* 97* 99 99  CO2 27 28 26 25 23   GLUCOSE 126* 125* 105* 132* 120*  BUN 89* 92* 97* 93* 100*  CREATININE 2.40* 2.18* 2.31* 2.65* 2.97*  CALCIUM 8.4* 8.1* 8.2*  8.1* 8.2*   GFR: Estimated Creatinine Clearance: 20.3 mL/min (A) (by C-G formula based on SCr of 2.97 mg/dL (H)). Liver Function Tests: No results for input(s): "AST", "ALT", "ALKPHOS", "BILITOT", "PROT", "ALBUMIN" in the last 168 hours.  No results for input(s): "LIPASE", "AMYLASE" in the last 168 hours. No results for input(s): "AMMONIA" in the last 168 hours. Coagulation Profile: No results for input(s): "INR", "PROTIME" in the last 168 hours. Cardiac Enzymes: No results for input(s): "CKTOTAL", "CKMB", "CKMBINDEX", "TROPONINI" in the last 168 hours.  BNP (last 3 results) No results for input(s): "PROBNP" in the last 8760 hours. HbA1C: No results for input(s): "HGBA1C" in the last 72 hours. CBG: Recent Labs  Lab 09/02/22 0827  GLUCAP 92   Lipid Profile: No results for  input(s): "CHOL", "HDL", "LDLCALC", "TRIG", "CHOLHDL", "LDLDIRECT" in the last 72 hours. Thyroid Function Tests: No results for input(s): "TSH", "T4TOTAL", "FREET4", "T3FREE", "THYROIDAB" in the last 72 hours. Anemia Panel: No results for input(s): "VITAMINB12", "FOLATE", "FERRITIN", "TIBC", "IRON", "RETICCTPCT" in the last 72 hours. Sepsis Labs: No results for input(s): "PROCALCITON", "LATICACIDVEN" in the last 168 hours.   No results found for this or any previous visit (from the past 240 hour(s)).        Radiology Studies: No results found.      Scheduled Meds:  apixaban  2.5 mg Oral BID   Chlorhexidine Gluconate Cloth  6 each Topical Daily   cyanocobalamin  1,000 mcg Oral Daily   diltiazem  360 mg Oral Daily   feeding supplement  237 mL Oral BID BM   furosemide  40 mg Oral Daily   metoprolol succinate  100 mg Oral Daily   multivitamins with iron  1 tablet Oral Daily   pantoprazole  40 mg Oral Daily   polyethylene glycol  17 g Oral Daily   rosuvastatin  40 mg Oral Daily   senna  1 tablet Oral Daily   Continuous Infusions:  ceFAZolin 2 g (09/02/22 0914)     LOS: 15 days     Tresa Moore, MD [facility]   If 7PM-7AM, please contact night-coverage  09/02/2022, 11:39 AM

## 2022-09-02 NOTE — TOC Progression Note (Signed)
Transition of Care [facility]) - Progression Note    Patient Details  Name: Brett Riley MRN: [id number] Date of Birth: 1936-01-21  [facility] CM/SW Contact  Margarito Liner, LCSW Phone Number: 09/02/2022, 10:57 AM  Clinical Narrative:  [place] [facility] insurance authorization still pending.   Expected Discharge Plan: Skilled Nursing Facility Barriers to Discharge: Continued Medical Work up, English as a second language teacher  Expected Discharge Plan and Services     Post Acute Care Choice: Skilled Nursing Facility Living arrangements for the past 2 months: Single Family Home                                       Social Determinants of Health (SDOH) Interventions SDOH Screenings   Food Insecurity: No Food Insecurity (08/18/2022)  Housing: Low Risk  (08/18/2022)  Transportation Needs: No Transportation Needs (08/18/2022)  Utilities: Not At Risk (08/18/2022)  Depression (PHQ2-9): Low Risk  (06/06/2022)  Financial Resource Strain: Low Risk  (08/29/2020)  Physical Activity: Insufficiently Active (08/29/2020)  Social Connections: Unknown (08/29/2020)  Stress: No Stress Concern Present (08/29/2020)  Tobacco Use: Low Risk  (08/18/2022)    Readmission Risk Interventions     No data to display

## 2022-09-02 NOTE — Progress Notes (Signed)
[facility]  ROUNDING NOTE   Subjective:   Patient sitting up in bed Wife at bedside Denies pain or discomfort Spouse states appetite appropriate.   Creatinine (2.65) (2.31) (2.18) (2.40) UOP  Objective:  Vital signs in last 24 hours:  Temp:  [97.5 F (36.4 C)-98.3 F (36.8 C)] 97.5 F (36.4 C) (07/01 1255) Pulse Rate:  [65-73] 66 (07/01 1255) Resp:  [16-20] 18 (07/01 1255) BP: (125-151)/(62-79) 148/79 (07/01 1255) SpO2:  [93 %-99 %] 99 % (07/01 1255) Weight:  [90.1 kg] 90.1 kg (07/01 0546)  Weight change: -1.3 kg Filed Weights   08/30/22 0437 09/01/22 0447 09/02/22 0546  Weight: 84.8 kg 91.4 kg 90.1 kg    Intake/Output: I/O last 3 completed shifts: In: 240 [P.O.:240] Out: 1800 [Urine:1800]   Intake/Output this shift:  Total I/O In: 610 [P.O.:510; IV Piggyback:100] Out: 1050 [Urine:1050]  Physical Exam: General: NAD, laying in bed  Head: Normocephalic, atraumatic. Moist oral mucosal membranes  Eyes: Anicteric  Lungs:  Diminished, 2L [place]  Heart: Regular rate and rhythm  Abdomen:  Soft, nontender  Extremities:  ++ peripheral edema.  Neurologic: Alert and oriented, moving all four extremities  Skin: No lesions  Access: none    Basic Metabolic Panel: Recent Labs  Lab 08/27/22 0454 08/28/22 1205 08/30/22 0640 09/01/22 0500 09/02/22 0930  NA 137 134* 133* 134* 134*  K 3.5 3.6 3.6 3.7 3.5  CL 99 96* 97* 99 99  CO2 27 28 26 25 23   GLUCOSE 126* 125* 105* 132* 120*  BUN 89* 92* 97* 93* 100*  CREATININE 2.40* 2.18* 2.31* 2.65* 2.97*  CALCIUM 8.4* 8.1* 8.2* 8.1* 8.2*     Liver Function Tests: No results for input(s): "AST", "ALT", "ALKPHOS", "BILITOT", "PROT", "ALBUMIN" in the last 168 hours.  No results for input(s): "LIPASE", "AMYLASE" in the last 168 hours. No results for input(s): "AMMONIA" in the last 168 hours.  CBC: Recent Labs  Lab 08/27/22 0454 08/28/22 0415 08/29/22 0500 08/30/22 0640 09/02/22 0930  WBC 10.1 19.1*  24.6* 18.3* 8.0  NEUTROABS  --   --   --  16.9* 6.7  HGB 8.3* 8.8* 8.7* 8.4* 8.9*  HCT 26.2* 27.1* 26.6* 25.8* 27.4*  MCV 93.2 91.6 93.0 92.1 91.9  PLT 248 264 253 250 236     Cardiac Enzymes: No results for input(s): "CKTOTAL", "CKMB", "CKMBINDEX", "TROPONINI" in the last 168 hours.   BNP: Invalid input(s): "POCBNP"  CBG: Recent Labs  Lab 09/02/22 0827  GLUCAP 92     Microbiology: Results for orders placed or performed during the hospital encounter of 08/18/22  Blood culture (routine x 2)     Status: None   Collection Time: 08/18/22 10:45 AM   Specimen: BLOOD LEFT ARM  Result Value Ref Range Status   Specimen Description BLOOD LEFT ARM  Final   Special Requests   Final    BOTTLES DRAWN AEROBIC AND ANAEROBIC Blood Culture adequate volume   Culture   Final    NO GROWTH 5 DAYS Performed at [facility], [address], [place], [place] [address]    Report Status 08/23/2022 FINAL  Final  Resp Panel by RT-PCR (Flu A&B, Covid) Anterior Nasal Swab     Status: None   Collection Time: 08/18/22 10:46 AM   Specimen: Anterior Nasal Swab  Result Value Ref Range Status   SARS Coronavirus 2 by RT PCR NEGATIVE NEGATIVE Final    Comment: (NOTE) SARS-CoV-2 target nucleic acids are NOT DETECTED.  The  SARS-CoV-2 RNA is generally detectable in upper respiratory specimens during the acute phase of infection. The lowest concentration of SARS-CoV-2 viral copies this assay can detect is 138 copies/mL. A negative result does not preclude SARS-Cov-2 infection and should not be used as the sole basis for treatment or other patient management decisions. A negative result may occur with  improper specimen collection/handling, submission of specimen other than nasopharyngeal swab, presence of viral mutation(s) within the areas targeted by this assay, and inadequate number of viral copies(<138 copies/mL). A negative result must be combined with clinical observations, patient  history, and epidemiological information. The expected result is Negative.  Fact Sheet for Patients:  BloggerCourse.com  Fact Sheet for Healthcare Providers:  SeriousBroker.it  This test is no t yet approved or cleared by the [place] FDA and  has been authorized for detection and/or diagnosis of SARS-CoV-2 by FDA under an Emergency Use Authorization (EUA). This EUA will remain  in effect (meaning this test can be used) for the duration of the COVID-19 declaration under Section 564(b)(1) of the Act, 21 U.S.C.section 360bbb-3(b)(1), unless the authorization is terminated  or revoked sooner.       Influenza A by PCR NEGATIVE NEGATIVE Final   Influenza B by PCR NEGATIVE NEGATIVE Final    Comment: (NOTE) The Xpert Xpress SARS-CoV-2/FLU/RSV plus assay is intended as an aid in the diagnosis of influenza from Nasopharyngeal swab specimens and should not be used as a sole basis for treatment. Nasal washings and aspirates are unacceptable for Xpert Xpress SARS-CoV-2/FLU/RSV testing.  Fact Sheet for Patients: BloggerCourse.com  Fact Sheet for Healthcare Providers: SeriousBroker.it  This test is not yet approved or cleared by the [place] FDA and has been authorized for detection and/or diagnosis of SARS-CoV-2 by FDA under an Emergency Use Authorization (EUA). This EUA will remain in effect (meaning this test can be used) for the duration of the COVID-19 declaration under Section 564(b)(1) of the Act, 21 U.S.C. section 360bbb-3(b)(1), unless the authorization is terminated or revoked.  Performed at [facility], [address] [place], [place] [address]   Culture, blood (Routine X 2) w Reflex to ID Panel     Status: None   Collection Time: 08/18/22  9:19 PM   Specimen: BLOOD LEFT ARM  Result Value Ref Range Status   Specimen Description BLOOD LEFT ARM  Final    Special Requests   Final    BOTTLES DRAWN AEROBIC AND ANAEROBIC Blood Culture adequate volume   Culture   Final    NO GROWTH 5 DAYS Performed at [facility], [address] [place], [place] [address]    Report Status 08/23/2022 FINAL  Final  Respiratory (~20 pathogens) panel by PCR     Status: None   Collection Time: 08/21/22  1:50 PM   Specimen: Nasopharyngeal Swab; Respiratory  Result Value Ref Range Status   Adenovirus NOT DETECTED NOT DETECTED Final   Coronavirus 229E NOT DETECTED NOT DETECTED Final    Comment: (NOTE) The Coronavirus on the Respiratory Panel, DOES NOT test for the novel  Coronavirus (2019 nCoV)    Coronavirus HKU1 NOT DETECTED NOT DETECTED Final   Coronavirus NL63 NOT DETECTED NOT DETECTED Final   Coronavirus OC43 NOT DETECTED NOT DETECTED Final   Metapneumovirus NOT DETECTED NOT DETECTED Final   Rhinovirus / Enterovirus NOT DETECTED NOT DETECTED Final   Influenza A NOT DETECTED NOT DETECTED Final   Influenza B NOT DETECTED NOT DETECTED Final   Parainfluenza Virus 1 NOT  DETECTED NOT DETECTED Final   Parainfluenza Virus 2 NOT DETECTED NOT DETECTED Final   Parainfluenza Virus 3 NOT DETECTED NOT DETECTED Final   Parainfluenza Virus 4 NOT DETECTED NOT DETECTED Final   Respiratory Syncytial Virus NOT DETECTED NOT DETECTED Final   Bordetella pertussis NOT DETECTED NOT DETECTED Final   Bordetella Parapertussis NOT DETECTED NOT DETECTED Final   Chlamydophila pneumoniae NOT DETECTED NOT DETECTED Final   Mycoplasma pneumoniae NOT DETECTED NOT DETECTED Final    Comment: Performed at [facility], [address], [place], [place] [address]  Culture, blood (Routine X 2) w Reflex to ID Panel     Status: None   Collection Time: 08/21/22  8:58 PM   Specimen: BLOOD  Result Value Ref Range Status   Specimen Description BLOOD LEFT WRIST  Final   Special Requests   Final    BOTTLES DRAWN AEROBIC AND ANAEROBIC Blood Culture adequate volume   Culture   Final     NO GROWTH 5 DAYS Performed at [facility], [address] [place], [place] [address]    Report Status 08/26/2022 FINAL  Final  Culture, blood (Routine X 2) w Reflex to ID Panel     Status: None   Collection Time: 08/21/22 11:01 PM   Specimen: BLOOD  Result Value Ref Range Status   Specimen Description BLOOD BLOOD LEFT ARM  Final   Special Requests   Final    BOTTLES DRAWN AEROBIC AND ANAEROBIC Blood Culture adequate volume   Culture   Final    NO GROWTH 5 DAYS Performed at [facility], [address] [place], [place] [address]    Report Status 08/26/2022 FINAL  Final  Expectorated Sputum Assessment w Gram Stain, Rflx to Resp Cult     Status: None   Collection Time: 08/22/22 11:22 AM   Specimen: Sputum  Result Value Ref Range Status   Specimen Description SPUTUM  Final   Special Requests EXPSU  Final   Sputum evaluation   Final    THIS SPECIMEN IS ACCEPTABLE FOR SPUTUM CULTURE Performed at [facility], [address], [place], [place] [address]    Report Status 08/22/2022 FINAL  Final  Culture, Respiratory w Gram Stain     Status: None   Collection Time: 08/22/22 11:22 AM   Specimen: SPU  Result Value Ref Range Status   Specimen Description   Final    SPUTUM Performed at [facility], [address], [place], [place] [address]    Special Requests   Final    EXPSU Reflexed from [id number] Performed at [facility], [address] [place], [place] [address]    Gram Stain   Final    ABUNDANT SQUAMOUS EPITHELIAL CELLS PRESENT FEW WBC PRESENT, PREDOMINANTLY PMN ABUNDANT GRAM VARIABLE ROD ABUNDANT GRAM POSITIVE COCCI IN PAIRS    Culture   Final    Normal respiratory flora-no Staph aureus or Pseudomonas seen Performed at [facility], [address], [place], [place] [address]    Report Status 08/25/2022 FINAL  Final    Coagulation Studies: No results for input(s): "LABPROT", "INR" in the last 72  hours.  Urinalysis: Recent Labs    09/02/22 1330  COLORURINE YELLOW*  LABSPEC 1.010  PHURINE 5.0  GLUCOSEU NEGATIVE  HGBUR MODERATE*  BILIRUBINUR NEGATIVE  KETONESUR NEGATIVE  PROTEINUR 30*  NITRITE NEGATIVE  LEUKOCYTESUR NEGATIVE       Imaging: No results found.   Medications:  ceFAZolin 2 g (09/02/22 0914)    apixaban  2.5 mg Oral BID   Chlorhexidine Gluconate Cloth  6 each Topical Daily   cyanocobalamin  1,000 mcg Oral Daily   diltiazem  360 mg Oral Daily   feeding supplement  237 mL Oral BID BM   furosemide  40 mg Oral Daily   metoprolol succinate  100 mg Oral Daily   multivitamins with iron  1 tablet Oral Daily   pantoprazole  40 mg Oral Daily   polyethylene glycol  17 g Oral Daily   rosuvastatin  40 mg Oral Daily   senna  1 tablet Oral Daily   acetaminophen, albuterol, dextromethorphan-guaiFENesin, melatonin, ondansetron (ZOFRAN) IV, simethicone, sodium chloride, traMADol  Assessment/ Plan:  [name] is a 87 y.o.  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  Acute kidney injury with proteinuria and hematuria: Baseline creatinine of 1.15 with normal GFR on 5/29. Serologic work up pending. Serum complements normal.  ATN and prerenal azotemia from overdiuresis. No obstruction on renal ultrasound. No recent IV contrast exposure. Nonoliguric urine output. No acute indication for dialysis.  - holding lisinopril - Creatinine increased to 2.97 - Adequate UOP - Continue daily furosemide -  Will order UA   2.  Acute on chronic diastolic heart failure.  Ejection fraction 55 to 60% with grade 1 diastolic dysfunction noted.  - Oral furosemide 40mg  daily - appreciate cardiology input.   3.  Anemia with acute kidney injury: PRBC transfusion on 6/18. Hgb 8.9    LOS: 15   7/1/20242:51 PM

## 2022-09-02 NOTE — Progress Notes (Signed)
Occupational Therapy Treatment Patient Details Name: Brett Riley MRN: [id number] DOB: 05-Mar-1935 Today's Date: 09/02/2022   History of present illness Brett Riley is a 87 y/o M admitted with respiratory failure; other PMH includes MI, CHF, a-fib, HTN, anemia. Was at SNF doing rehab prior to admission; was previously admitted 5/18-6/7.   OT comments  Co-tx completed with PT to maximize pt safety, session limited by BM and fatigue. maxA x 2 for all bed mobility, posterior/right lean sitting, visual/verbal cues for upright posture and feet placement. STS with RW maxAx2 for short standing bout with BM, R lean during standing. On bed pan, NT notified. Pt would benefit from skilled OT services to reduce caregiver burden, improve level of IND in self-care, and to decrease the risk of falls.    Recommendations for follow up therapy are one component of a multi-disciplinary discharge planning process, led by the attending physician.  Recommendations may be updated based on patient status, additional functional criteria and insurance authorization.    Assistance Recommended at Discharge Frequent or constant Supervision/Assistance  Patient can return home with the following  Two people to help with walking and/or transfers;A lot of help with bathing/dressing/bathroom;Assistance with cooking/housework;Direct supervision/assist for medications management;Direct supervision/assist for financial management;Assist for transportation;Help with stairs or ramp for entrance         Precautions / Restrictions Precautions Precautions: Fall Restrictions Weight Bearing Restrictions: No       Mobility Bed Mobility Overal bed mobility: Needs Assistance Bed Mobility: Supine to Sit, Sit to Supine, Rolling Rolling: Max assist, +2 for physical assistance   Supine to sit: Max assist, +2 for physical assistance Sit to supine: Max assist, +2 for physical assistance   General bed mobility comments: increased  time and effort required. cues for task initiation and sequencing    Transfers Overall transfer level: Needs assistance Equipment used: Rolling walker (2 wheels) Transfers: Sit to/from Stand Sit to Stand: Max assist           General transfer comment: wide BOS, BM in standing. MaxAx2 with elevated bed to perform standing, heavy R lean throughout     Balance Overall balance assessment: Needs assistance Sitting-balance support: Feet supported, Bilateral upper extremity supported, Single extremity supported Sitting balance-Leahy Scale: Poor Sitting balance - Comments: momentarily CGA for some sitting, predominantly min-modA due to R lean Postural control: Right lateral lean, Posterior lean Standing balance support: Bilateral upper extremity supported, Reliant on assistive device for balance Standing balance-Leahy Scale: Zero Standing balance comment: needed cues to maintain upright posture, forward flexed                           ADL either performed or assessed with clinical judgement   ADL Overall ADL's : Needs assistance/impaired                             Toileting- Clothing Manipulation and Hygiene: Total assistance;+2 for physical assistance;Bed level              Extremity/Trunk Assessment Upper Extremity Assessment Upper Extremity Assessment: Generalized weakness             Cognition Arousal/Alertness: Awake/alert Behavior During Therapy: Flat affect Overall Cognitive Status: Within Functional Limits for tasks assessed  General Comments: HOH        Exercises Other Exercises Other Exercises: rolling b/l for hygeine due to BM x 2. max assist for hygeine.            Pertinent Vitals/ Pain       Pain Assessment Pain Assessment: 0-10 Pain Score: 4  Pain Location: low back Pain Descriptors / Indicators: Guarding, Discomfort, Grimacing Pain Intervention(s): Limited activity  within patient's tolerance   Frequency  Min 1X/week        Progress Toward Goals  OT Goals(current goals can now be found in the care plan section)  Progress towards OT goals: Progressing toward goals  Acute Rehab OT Goals OT Goal Formulation: With patient Time For Goal Achievement: 09/09/22 Potential to Achieve Goals: Good  Plan Discharge plan remains appropriate;Frequency remains appropriate    Co-evaluation    PT/OT/SLP Co-Evaluation/Treatment: Yes Reason for Co-Treatment: Necessary to address cognition/behavior during functional activity;For patient/therapist safety;To address functional/ADL transfers PT goals addressed during session: Mobility/safety with mobility;Balance OT goals addressed during session: ADL's and self-care;Proper use of Adaptive equipment and DME      AM-PAC OT "6 Clicks" Daily Activity     Outcome Measure   Help from another person eating meals?: None Help from another person taking care of personal grooming?: A Little Help from another person toileting, which includes using toliet, bedpan, or urinal?: Total Help from another person bathing (including washing, rinsing, drying)?: A Lot Help from another person to put on and taking off regular upper body clothing?: A Lot Help from another person to put on and taking off regular lower body clothing?: A Lot 6 Click Score: 14    End of Session Equipment Utilized During Treatment: Rolling walker (2 wheels);Gait belt  OT Visit Diagnosis: Unsteadiness on feet (R26.81);Muscle weakness (generalized) (M62.81)   Activity Tolerance Patient limited by fatigue (Limited by BM)   Patient Left in bed;with call bell/phone within reach;with bed alarm set   Nurse Communication Mobility status;Other (comment) (position on bedpan)        Time: 1478-2956 OT Time Calculation (min): 25 min  Charges: OT General Charges $OT Visit: 1 Visit OT Treatments $Self Care/Home Management : 8-22 mins Brett Riley L.  Bobbette Eakes, OTR/L  09/02/22, 1:13 PM

## 2022-09-02 NOTE — Care Management Important Message (Signed)
Important Message  Patient Details  Name: Brett Riley MRN: [id number] Date of Birth: 15-Jul-1935   Medicare Important Message Given:  Yes  Patient asleep upon time of visit, no family in room. Confirmed copy of Medicare IM available in room for reference.   [name] 09/02/2022, 12:16 PM

## 2022-09-03 DIAGNOSIS — J9601 Acute respiratory failure with hypoxia: Secondary | ICD-10-CM | POA: Diagnosis not present

## 2022-09-03 LAB — BASIC METABOLIC PANEL WITH GFR
Anion gap: 10 (ref 5–15)
BUN: 98 mg/dL — ABNORMAL HIGH (ref 8–23)
CO2: 26 mmol/L (ref 22–32)
Calcium: 8.3 mg/dL — ABNORMAL LOW (ref 8.9–10.3)
Chloride: 101 mmol/L (ref 98–111)
Creatinine, Ser: 2.83 mg/dL — ABNORMAL HIGH (ref 0.61–1.24)
GFR, Estimated: 21 mL/min — ABNORMAL LOW
Glucose, Bld: 152 mg/dL — ABNORMAL HIGH (ref 70–99)
Potassium: 3.1 mmol/L — ABNORMAL LOW (ref 3.5–5.1)
Sodium: 137 mmol/L (ref 135–145)

## 2022-09-03 LAB — MISC LABCORP TEST (SEND OUT): Labcorp test code: 3001866

## 2022-09-03 MED ORDER — POTASSIUM CHLORIDE CRYS ER 20 MEQ PO TBCR
40.0000 meq | EXTENDED_RELEASE_TABLET | Freq: Once | ORAL | Status: AC
  Administered 2022-09-03: 40 meq via ORAL
  Filled 2022-09-03: qty 2

## 2022-09-03 MED ORDER — ALBUMIN HUMAN 25 % IV SOLN
25.0000 g | Freq: Two times a day (BID) | INTRAVENOUS | Status: AC
  Administered 2022-09-03 (×2): 25 g via INTRAVENOUS
  Filled 2022-09-03 (×2): qty 100

## 2022-09-03 NOTE — Progress Notes (Signed)
PROGRESS NOTE    Brett Riley  YNW:[id number] DOB: 1935-05-23 DOA: 08/18/2022 PCP: Dale Juniata, MD    Brief Narrative:  87 y.o. male with medical history significant of chronic dCHF, HTN, HLD, CAD, CKD-3a, anemia, chronic A fib on Eliquis, who presents 08/18/2022 to ED from rehab, chief complaint SOB.  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 Net IO Since Admission: -5,549.14 mL [08/20/22 1608]. Heparin stopped d/t rectal bleeding overnight.  06/19: euvolemic per cardio, d/c lasix, O2 requirement still high, CT chest reviewed, ?edema/atypical infection, given clinically worsening respiratory status have asked pulmonary and ID to consult. Have d/c amiodarone and ordered steroids in case amio toxicity.  06/20: ID and pulmonary assisting with the management.   6/22-6/25: patient feels clinically improved. Stable with oxygen via [place] and weaned down to 2-3L. Patient requiring judicial adjustments to balance between hypervolemia and renal injury with diuresis. Unable to pull fluid from lungs with thoracentesis so medical management is best option. His IV Abx to treat his vertebral osteomyelitis from previous admission continue unchanged and managed by ID.   6/28: Bed offered at [facility].  Authorization pending 7/1: No status changes.  Per patient's daughter he has been seeming more depressed.  I have offered outpatient referral to psychiatry.  7/2: English as a second language teacher for LTAC continues to be pending   Assessment & Plan:   Principal Problem:   Acute respiratory failure with  hypoxia (HCC) Active Problems:   Acute on chronic heart failure with preserved ejection fraction (HFpEF) (HCC)   MSSA bacteremia   Vertebral osteomyelitis (HCC)   CAD (coronary artery disease)   Myocardial injury   Hypertension   Hyperlipidemia   Chronic kidney disease, stage 3a (HCC)   Normocytic anemia   Atrial fibrillation, chronic (HCC)   Paroxysmal atrial fibrillation (HCC)   HCAP (healthcare-associated pneumonia)   Sepsis (HCC)   Pleural effusion   Amiodarone pulmonary toxicity   Acute on chronic HFrEF (heart failure with reduced ejection fraction) (HCC)   Pleural effusion on right   Status post thoracentesis   Heart failure with preserved ejection fraction (HCC)   Stage 3 chronic kidney disease (HCC)  Acute on chronic respiratory failure with hypoxia due to acute on chronic CHF  possible pneumonia, bilateral small pleural effusion- as seen on chest CT Not on oxygen supplementation at baseline Currently on 3L with O2 saturation in upper 90s. Plan: - wean as tolerated - f/u sputum culture results-no growth -Completed course of azithromycin. - Continue Ancef as written for his osteomyelitis.  Last dose 7/12 - incentive spirometer q1hr - wean steroids -Medically stable for discharge.   - As of 7/2 LTAC insurance authorization still pending   Acute on chronic diastolic CHF- Markedly net negative.  Appears euvolemic Plan: Lasix 40 mg PO daily TED hose Strict I's and O's, daily weights   Pleural effusion- IR unable to perform thoracentesis 6/20, 6/21. Medical management. Clinically improving respiratory status.     Atrial fibrillation, chronic (HCC): Heart rate now well controlled in 60-70s diltiazem, metoprolol per cardiology Lisinopril held with AKI Restarted eliquis (renally dosed) in setting of no new  bleeding and patient's hgb has been stable >5 days after concern for GI bleed early in admission on heparin gtt.   Recent hx of MSSA bacteremia vertebral  osteomyelitis (HCC): continue IV Ancef until 7/12. Follow up with ID outpatient 7/11 repeat Blood culture NGTD   CAD  Myocardial injury  HLD:  trop  258, possibly due to demand ischemia secondary to CHF exacerbation.  Patient does not have chest pain. Crestor, beta blocker, Aspirin 81 discontinued as he's on eliquis  Cardiology following  May need coronary angiography at some point but will defer for now     AKI on Chronic kidney disease, stage 3a [facility]): Cr stable 2.4>2.4 even with use of diuretics. Baseline creatinine 1.5-1.9 Follow BMP Nephrology following    Normocytic anemia: hgb stable around 8.5-9. S/p 1 unit PRBC 06/18 ABLA w/ rectal bleed   Hypertension Continue to closely monitor vital signs.   Resolved hypokalemia   GERD PPI    Right knee pain -X-ray with chronic arthritic changes.  Recommend frequent mobility   DVT prophylaxis: Eliquis Code Status: DNR Family Communication: Spouse at bedside 6/26, 6/27, 6/28, 6/30, 7/1 Disposition Plan: Status is: Inpatient Remains inpatient appropriate because: Pending placement.  Unsafe discharge plan.  Medically stable    Level of care: Stepdown  Consultants:  Cardiology Nephrology  Procedures:  None  Antimicrobials: Ancef   Subjective: Seen and examined.  Resting in bed.   Affect flat  Objective: Vitals:   09/03/22 0354 09/03/22 0516 09/03/22 0748 09/03/22 1141  BP: (!) 172/73  (!) 160/68 (!) 125/58  Pulse: 68  69 64  Resp: 18  16 16   Temp: 97.8 F (36.6 C)  97.9 F (36.6 C) 98 F (36.7 C)  TempSrc: Oral     SpO2: 92%  99% 100%  Weight:  88 kg    Height:        Intake/Output Summary (Last 24 hours) at 09/03/2022 1344 Last data filed at 09/03/2022 1200 Gross per 24 hour  Intake 560 ml  Output 1475 ml  Net -915 ml   Filed Weights   09/01/22 0447 09/02/22 0546 09/03/22 0516  Weight: 91.4 kg 90.1 kg 88 kg    Examination:  General exam: NAD.  Lying in bed.  Appears fatigued Respiratory  system: Right-sided crackles.  Normal work of breathing.  3 L Cardiovascular system: S1-S2, RRR, no murmurs, no pedal edema Gastrointestinal system: Soft, NT/ND, normal bowel sounds Central nervous system: Alert and oriented. No focal neurological deficits. Extremities: Right lower extremity decreased ROM Skin: No rashes, lesions or ulcers Psychiatry: Judgement and insight appear normal. Mood & affect flattened.     Data Reviewed: I have personally reviewed following labs and imaging studies  CBC: Recent Labs  Lab 08/28/22 0415 08/29/22 0500 08/30/22 0640 09/02/22 0930  WBC 19.1* 24.6* 18.3* 8.0  NEUTROABS  --   --  16.9* 6.7  HGB 8.8* 8.7* 8.4* 8.9*  HCT 27.1* 26.6* 25.8* 27.4*  MCV 91.6 93.0 92.1 91.9  PLT 264 253 250 236   Basic Metabolic Panel: Recent Labs  Lab 08/28/22 1205 08/30/22 0640 09/01/22 0500 09/02/22 0930 09/03/22 1000  NA 134* 133* 134* 134* 137  K 3.6 3.6 3.7 3.5 3.1*  CL 96* 97* 99 99 101  CO2 28 26 25 23 26   GLUCOSE 125* 105* 132* 120* 152*  BUN 92* 97* 93* 100* 98*  CREATININE 2.18* 2.31* 2.65* 2.97* 2.83*  CALCIUM 8.1* 8.2* 8.1* 8.2* 8.3*   GFR: Estimated Creatinine Clearance: 21.3  mL/min (A) (by C-G formula based on SCr of 2.83 mg/dL (H)). Liver Function Tests: Recent Labs  Lab 09/02/22 1800  ALBUMIN 2.2*    No results for input(s): "LIPASE", "AMYLASE" in the last 168 hours. No results for input(s): "AMMONIA" in the last 168 hours. Coagulation Profile: No results for input(s): "INR", "PROTIME" in the last 168 hours. Cardiac Enzymes: No results for input(s): "CKTOTAL", "CKMB", "CKMBINDEX", "TROPONINI" in the last 168 hours.  BNP (last 3 results) No results for input(s): "PROBNP" in the last 8760 hours. HbA1C: No results for input(s): "HGBA1C" in the last 72 hours. CBG: Recent Labs  Lab 09/02/22 0827  GLUCAP 92   Lipid Profile: No results for input(s): "CHOL", "HDL", "LDLCALC", "TRIG", "CHOLHDL", "LDLDIRECT" in the last 72  hours. Thyroid Function Tests: No results for input(s): "TSH", "T4TOTAL", "FREET4", "T3FREE", "THYROIDAB" in the last 72 hours. Anemia Panel: No results for input(s): "VITAMINB12", "FOLATE", "FERRITIN", "TIBC", "IRON", "RETICCTPCT" in the last 72 hours. Sepsis Labs: No results for input(s): "PROCALCITON", "LATICACIDVEN" in the last 168 hours.   No results found for this or any previous visit (from the past 240 hour(s)).        Radiology Studies: No results found.      Scheduled Meds:  apixaban  2.5 mg Oral BID   Chlorhexidine Gluconate Cloth  6 each Topical Daily   cyanocobalamin  1,000 mcg Oral Daily   diltiazem  360 mg Oral Daily   feeding supplement  237 mL Oral BID BM   furosemide  40 mg Oral Daily   metoprolol succinate  100 mg Oral Daily   multivitamins with iron  1 tablet Oral Daily   pantoprazole  40 mg Oral Daily   polyethylene glycol  17 g Oral Daily   potassium chloride  40 mEq Oral Once   rosuvastatin  40 mg Oral Daily   senna  1 tablet Oral Daily   Continuous Infusions:  albumin human     ceFAZolin Stopped (09/03/22 1011)     LOS: 16 days     Tresa Moore, MD [facility]   If 7PM-7AM, please contact night-coverage  09/03/2022, 1:44 PM

## 2022-09-03 NOTE — Progress Notes (Signed)
[facility]  ROUNDING NOTE   Subjective:   Patient seen resting quietly Arousable today Denies pain or discomfort Appetite remains appropriate Denies nausea vomiting  Creatinine 2.83 (2.97) (2.65) (2.31) (2.18) (2.40) UOP  Objective:  Vital signs in last 24 hours:  Temp:  [97.5 F (36.4 C)-98.4 F (36.9 C)] 98 F (36.7 C) (07/02 1141) Pulse Rate:  [64-69] 64 (07/02 1141) Resp:  [16-20] 16 (07/02 1141) BP: (109-172)/(58-73) 125/58 (07/02 1141) SpO2:  [92 %-100 %] 100 % (07/02 1141) Weight:  [88 kg] 88 kg (07/02 0516)  Weight change: -2.1 kg Filed Weights   09/01/22 0447 09/02/22 0546 09/03/22 0516  Weight: 91.4 kg 90.1 kg 88 kg    Intake/Output: I/O last 3 completed shifts: In: 1070 [P.O.:870; IV Piggyback:200] Out: 3125 [Urine:3125]   Intake/Output this shift:  Total I/O In: 100 [IV Piggyback:100] Out: 300 [Urine:300]  Physical Exam: General: NAD, laying in bed  Head: Normocephalic, atraumatic. Moist oral mucosal membranes  Eyes: Anicteric  Lungs:  Diminished, 2L [place]  Heart: Regular rate and rhythm  Abdomen:  Soft, nontender  Extremities:  ++ peripheral edema.  Neurologic: Alert and oriented, moving all four extremities  Skin: No lesions  Access: none    Basic Metabolic Panel: Recent Labs  Lab 08/28/22 1205 08/30/22 0640 09/01/22 0500 09/02/22 0930 09/03/22 1000  NA 134* 133* 134* 134* 137  K 3.6 3.6 3.7 3.5 3.1*  CL 96* 97* 99 99 101  CO2 28 26 25 23 26   GLUCOSE 125* 105* 132* 120* 152*  BUN 92* 97* 93* 100* 98*  CREATININE 2.18* 2.31* 2.65* 2.97* 2.83*  CALCIUM 8.1* 8.2* 8.1* 8.2* 8.3*     Liver Function Tests: Recent Labs  Lab 09/02/22 1800  ALBUMIN 2.2*    No results for input(s): "LIPASE", "AMYLASE" in the last 168 hours. No results for input(s): "AMMONIA" in the last 168 hours.  CBC: Recent Labs  Lab 08/28/22 0415 08/29/22 0500 08/30/22 0640 09/02/22 0930  WBC 19.1* 24.6* 18.3* 8.0  NEUTROABS  --    --  16.9* 6.7  HGB 8.8* 8.7* 8.4* 8.9*  HCT 27.1* 26.6* 25.8* 27.4*  MCV 91.6 93.0 92.1 91.9  PLT 264 253 250 236     Cardiac Enzymes: No results for input(s): "CKTOTAL", "CKMB", "CKMBINDEX", "TROPONINI" in the last 168 hours.   BNP: Invalid input(s): "POCBNP"  CBG: Recent Labs  Lab 09/02/22 0827  GLUCAP 92     Microbiology: Results for orders placed or performed during the hospital encounter of 08/18/22  Blood culture (routine x 2)     Status: None   Collection Time: 08/18/22 10:45 AM   Specimen: BLOOD LEFT ARM  Result Value Ref Range Status   Specimen Description BLOOD LEFT ARM  Final   Special Requests   Final    BOTTLES DRAWN AEROBIC AND ANAEROBIC Blood Culture adequate volume   Culture   Final    NO GROWTH 5 DAYS Performed at [facility], [address], [place], [place] [address]    Report Status 08/23/2022 FINAL  Final  Resp Panel by RT-PCR (Flu A&B, Covid) Anterior Nasal Swab     Status: None   Collection Time: 08/18/22 10:46 AM   Specimen: Anterior Nasal Swab  Result Value Ref Range Status   SARS Coronavirus 2 by RT PCR NEGATIVE NEGATIVE Final    Comment: (NOTE) SARS-CoV-2 target nucleic acids are NOT DETECTED.  The SARS-CoV-2 RNA is generally detectable in upper respiratory specimens during the acute  phase of infection. The lowest concentration of SARS-CoV-2 viral copies this assay can detect is 138 copies/mL. A negative result does not preclude SARS-Cov-2 infection and should not be used as the sole basis for treatment or other patient management decisions. A negative result may occur with  improper specimen collection/handling, submission of specimen other than nasopharyngeal swab, presence of viral mutation(s) within the areas targeted by this assay, and inadequate number of viral copies(<138 copies/mL). A negative result must be combined with clinical observations, patient history, and epidemiological information. The expected result is  Negative.  Fact Sheet for Patients:  BloggerCourse.com  Fact Sheet for Healthcare Providers:  SeriousBroker.it  This test is no t yet approved or cleared by the [place] FDA and  has been authorized for detection and/or diagnosis of SARS-CoV-2 by FDA under an Emergency Use Authorization (EUA). This EUA will remain  in effect (meaning this test can be used) for the duration of the COVID-19 declaration under Section 564(b)(1) of the Act, 21 U.S.C.section 360bbb-3(b)(1), unless the authorization is terminated  or revoked sooner.       Influenza A by PCR NEGATIVE NEGATIVE Final   Influenza B by PCR NEGATIVE NEGATIVE Final    Comment: (NOTE) The Xpert Xpress SARS-CoV-2/FLU/RSV plus assay is intended as an aid in the diagnosis of influenza from Nasopharyngeal swab specimens and should not be used as a sole basis for treatment. Nasal washings and aspirates are unacceptable for Xpert Xpress SARS-CoV-2/FLU/RSV testing.  Fact Sheet for Patients: BloggerCourse.com  Fact Sheet for Healthcare Providers: SeriousBroker.it  This test is not yet approved or cleared by the [place] FDA and has been authorized for detection and/or diagnosis of SARS-CoV-2 by FDA under an Emergency Use Authorization (EUA). This EUA will remain in effect (meaning this test can be used) for the duration of the COVID-19 declaration under Section 564(b)(1) of the Act, 21 U.S.C. section 360bbb-3(b)(1), unless the authorization is terminated or revoked.  Performed at [facility], [address] [place], [place] [address]   Culture, blood (Routine X 2) w Reflex to ID Panel     Status: None   Collection Time: 08/18/22  9:19 PM   Specimen: BLOOD LEFT ARM  Result Value Ref Range Status   Specimen Description BLOOD LEFT ARM  Final   Special Requests   Final    BOTTLES DRAWN AEROBIC AND ANAEROBIC  Blood Culture adequate volume   Culture   Final    NO GROWTH 5 DAYS Performed at [facility], [address] [place], [place] [address]    Report Status 08/23/2022 FINAL  Final  Respiratory (~20 pathogens) panel by PCR     Status: None   Collection Time: 08/21/22  1:50 PM   Specimen: Nasopharyngeal Swab; Respiratory  Result Value Ref Range Status   Adenovirus NOT DETECTED NOT DETECTED Final   Coronavirus 229E NOT DETECTED NOT DETECTED Final    Comment: (NOTE) The Coronavirus on the Respiratory Panel, DOES NOT test for the novel  Coronavirus (2019 nCoV)    Coronavirus HKU1 NOT DETECTED NOT DETECTED Final   Coronavirus NL63 NOT DETECTED NOT DETECTED Final   Coronavirus OC43 NOT DETECTED NOT DETECTED Final   Metapneumovirus NOT DETECTED NOT DETECTED Final   Rhinovirus / Enterovirus NOT DETECTED NOT DETECTED Final   Influenza A NOT DETECTED NOT DETECTED Final   Influenza B NOT DETECTED NOT DETECTED Final   Parainfluenza Virus 1 NOT DETECTED NOT DETECTED Final   Parainfluenza Virus 2 NOT DETECTED NOT  DETECTED Final   Parainfluenza Virus 3 NOT DETECTED NOT DETECTED Final   Parainfluenza Virus 4 NOT DETECTED NOT DETECTED Final   Respiratory Syncytial Virus NOT DETECTED NOT DETECTED Final   Bordetella pertussis NOT DETECTED NOT DETECTED Final   Bordetella Parapertussis NOT DETECTED NOT DETECTED Final   Chlamydophila pneumoniae NOT DETECTED NOT DETECTED Final   Mycoplasma pneumoniae NOT DETECTED NOT DETECTED Final    Comment: Performed at [facility], [address] [place], [place] [address]  Culture, blood (Routine X 2) w Reflex to ID Panel     Status: None   Collection Time: 08/21/22  8:58 PM   Specimen: BLOOD  Result Value Ref Range Status   Specimen Description BLOOD LEFT WRIST  Final   Special Requests   Final    BOTTLES DRAWN AEROBIC AND ANAEROBIC Blood Culture adequate volume   Culture   Final    NO GROWTH 5 DAYS Performed at [facility], [address] [place], [place] [address]    Report Status 08/26/2022 FINAL  Final  Culture, blood (Routine X 2) w Reflex to ID Panel     Status: None   Collection Time: 08/21/22 11:01 PM   Specimen: BLOOD  Result Value Ref Range Status   Specimen Description BLOOD BLOOD LEFT ARM  Final   Special Requests   Final    BOTTLES DRAWN AEROBIC AND ANAEROBIC Blood Culture adequate volume   Culture   Final    NO GROWTH 5 DAYS Performed at [facility], [address] [place], [place] [address]    Report Status 08/26/2022 FINAL  Final  Expectorated Sputum Assessment w Gram Stain, Rflx to Resp Cult     Status: None   Collection Time: 08/22/22 11:22 AM   Specimen: Sputum  Result Value Ref Range Status   Specimen Description SPUTUM  Final   Special Requests EXPSU  Final   Sputum evaluation   Final    THIS SPECIMEN IS ACCEPTABLE FOR SPUTUM CULTURE Performed at [facility], [address] [place], [place] [address]    Report Status 08/22/2022 FINAL  Final  Culture, Respiratory w Gram Stain     Status: None   Collection Time: 08/22/22 11:22 AM   Specimen: SPU  Result Value Ref Range Status   Specimen Description   Final    SPUTUM Performed at [facility], [address] [place], [place] [address]    Special Requests   Final    EXPSU Reflexed from [id number] Performed at [facility], [address] [place], [place] [address]    Gram Stain   Final    ABUNDANT SQUAMOUS EPITHELIAL CELLS PRESENT FEW WBC PRESENT, PREDOMINANTLY PMN ABUNDANT GRAM VARIABLE ROD ABUNDANT GRAM POSITIVE COCCI IN PAIRS    Culture   Final    Normal respiratory flora-no Staph aureus or Pseudomonas seen Performed at [facility], [address] [place], [place] [address]    Report Status 08/25/2022 FINAL  Final    Coagulation Studies: No results for input(s): "LABPROT", "INR" in the last 72 hours.  Urinalysis: Recent Labs    09/02/22 1330  COLORURINE YELLOW*   LABSPEC 1.010  PHURINE 5.0  GLUCOSEU NEGATIVE  HGBUR MODERATE*  BILIRUBINUR NEGATIVE  KETONESUR NEGATIVE  PROTEINUR 30*  NITRITE NEGATIVE  LEUKOCYTESUR NEGATIVE       Imaging: No results found.   Medications:    ceFAZolin Stopped (09/03/22 1011)    apixaban  2.5 mg Oral  BID   Chlorhexidine Gluconate Cloth  6 each Topical Daily   cyanocobalamin  1,000 mcg Oral Daily   diltiazem  360 mg Oral Daily   feeding supplement  237 mL Oral BID BM   furosemide  40 mg Oral Daily   metoprolol succinate  100 mg Oral Daily   multivitamins with iron  1 tablet Oral Daily   pantoprazole  40 mg Oral Daily   polyethylene glycol  17 g Oral Daily   potassium chloride  40 mEq Oral Once   rosuvastatin  40 mg Oral Daily   senna  1 tablet Oral Daily   acetaminophen, albuterol, dextromethorphan-guaiFENesin, melatonin, ondansetron (ZOFRAN) IV, simethicone, sodium chloride, traMADol  Assessment/ Plan:  Mr. JOZEF ZAHNOW is a 87 y.o.  male with recent prolonged admission for aortic ulcer s/p endovascular repair 07/24/22, MSSA bacteremia with L spine osteomyelitis and discitis, chronic diastolic heart failure, hypertension, hyperlipidemia, coronary artery disease, atrial fibrillation who was admitted to [facility] on 08/18/2022 for HCAP (healthcare-associated pneumonia) [J18.9] Acute on chronic respiratory failure with hypoxia (HCC) [J96.21] Sepsis, due to unspecified organism, unspecified whether acute organ dysfunction present (HCC) [A41.9]     1.  Acute kidney injury with proteinuria and hematuria: Baseline creatinine of 1.15 with normal GFR on 5/29. Serologic work up pending. Serum complements normal.  ATN and prerenal azotemia from overdiuresis. No obstruction on renal ultrasound. No recent IV contrast exposure. Nonoliguric urine output. No acute indication for dialysis.  - holding lisinopril - Creatinine 2.83 with 2225 mL of urine output recorded. - Continue daily furosemide -UA improved since  admission, decreased proteinuria however moderate hematuria remains. -Albumin 2.2, will order albumin 25 g for 2 doses.   2.  Acute on chronic diastolic heart failure.  Ejection fraction 55 to 60% with grade 1 diastolic dysfunction noted.  - Oral furosemide 40mg  daily - appreciate cardiology input.   3.  Anemia with acute kidney injury: PRBC transfusion on 6/18. Hgb 8.9    LOS: 16   7/2/20241:33 PM

## 2022-09-03 NOTE — TOC Progression Note (Signed)
Transition of Care [facility]) - Progression Note    Patient Details  Name: Brett Riley MRN: [id number] Date of Birth: [birth year]  Transition of Care [facility]) CM/SW Contact  Margarito Liner, LCSW Phone Number: 09/03/2022, 1:44 PM  Clinical Narrative:  Insurance authorization still pending.   Expected Discharge Plan: Skilled Nursing Facility Barriers to Discharge: Continued Medical Work up, English as a second language teacher  Expected Discharge Plan and Services     Post Acute Care Choice: Skilled Nursing Facility Living arrangements for the past 2 months: Single Family Home                                       Social Determinants of Health (SDOH) Interventions SDOH Screenings   Food Insecurity: No Food Insecurity (08/18/2022)  Housing: Low Risk  (08/18/2022)  Transportation Needs: No Transportation Needs (08/18/2022)  Utilities: Not At Risk (08/18/2022)  Depression (PHQ2-9): Low Risk  (06/06/2022)  Financial Resource Strain: Low Risk  (08/29/2020)  Physical Activity: Insufficiently Active (08/29/2020)  Social Connections: Unknown (08/29/2020)  Stress: No Stress Concern Present (08/29/2020)  Tobacco Use: Low Risk  (08/18/2022)    Readmission Risk Interventions     No data to display

## 2022-09-04 ENCOUNTER — Telehealth (INDEPENDENT_AMBULATORY_CARE_PROVIDER_SITE_OTHER): Payer: Self-pay | Admitting: Vascular Surgery

## 2022-09-04 ENCOUNTER — Inpatient Hospital Stay: Payer: Medicare HMO

## 2022-09-04 DIAGNOSIS — J9601 Acute respiratory failure with hypoxia: Secondary | ICD-10-CM | POA: Diagnosis not present

## 2022-09-04 MED ORDER — FUROSEMIDE 10 MG/ML IJ SOLN
40.0000 mg | Freq: Every day | INTRAMUSCULAR | Status: DC
  Filled 2022-09-04 (×2): qty 4

## 2022-09-04 NOTE — Progress Notes (Signed)
Occupational Therapy Treatment Patient Details Name: Brett Riley MRN: [id number] DOB: Jul 22, 1935 Today's Date: 09/04/2022   History of present illness Jalynn Hutzell is a 87 y/o M admitted with respiratory failure; other PMH includes MI, CHF, a-fib, HTN, anemia. Was at SNF doing rehab prior to admission; was previously admitted 5/18-6/7.   OT comments  Pt received with spouse in room, agreeable to OT session. PT/OT co-tx to maximize therapeutic outcomes and for pt's safety. Slight progression with seated EOB orientation to midline; less physical assist overall to correct R/posterior lean while sitting BLE/UE supported. Continues to require frequent, long rest breaks after minimal bouts of mobility. O2 on 2L via [place], 95%+ throughout tx. Bed mobility performed with MODAx2, multimodal cuing required to perform task and increased time/effortful movements with pt closing eyes to take deep breaths periodically. Skilled cuing throughout for hand placement, upright posture, and re-orientation to midline with R lean. Unable to stand fully from EOB with +2 TOTAL A. Large BM noted, pt placed on bedpan, TOTAL A for peri-care. Notified nursing of pt's position. Call bell in reach, alarm set. Continue to address functional deficits noted in ADL and mobility.    Recommendations for follow up therapy are one component of a multi-disciplinary discharge planning process, led by the attending physician.  Recommendations may be updated based on patient status, additional functional criteria and insurance authorization.    Assistance Recommended at Discharge Frequent or constant Supervision/Assistance  Patient can return home with the following  Two people to help with walking and/or transfers;A lot of help with bathing/dressing/bathroom;Assistance with cooking/housework;Direct supervision/assist for medications management;Direct supervision/assist for financial management;Assist for transportation;Help with stairs or ramp  for entrance   Equipment Recommendations  Other (comment)       Precautions / Restrictions Precautions Precautions: Fall Restrictions Weight Bearing Restrictions: No       Mobility Bed Mobility Overal bed mobility: Needs Assistance Bed Mobility: Supine to Sit, Sit to Supine, Rolling Rolling: Min guard   Supine to sit: +2 for physical assistance, HOB elevated, Mod assist Sit to supine: +2 for physical assistance, Max assist   General bed mobility comments: assistance with trunk support and b/l LEs    Transfers Overall transfer level: Needs assistance Equipment used: Rolling walker (2 wheels) Transfers: Sit to/from Stand Sit to Stand: Total assist, +2 physical assistance           General transfer comment: elevated bed and total assist to come in to standing. Pt struggled to come into complete standing.     Balance Overall balance assessment: Needs assistance Sitting-balance support: Feet supported, Bilateral upper extremity supported, Single extremity supported Sitting balance-Leahy Scale: Poor Sitting balance - Comments: predominantly min-mod A with small bouts of CGA. Consistent posterior right lean that is corrected with cueing. Postural control: Right lateral lean, Posterior lean Standing balance support: Bilateral upper extremity supported, Reliant on assistive device for balance Standing balance-Leahy Scale: Zero Standing balance comment: total assist from PT/OT to stand with RW, unable to completely come in to standing.                           ADL either performed or assessed with clinical judgement   ADL                               Toileting- Clothing Manipulation and Hygiene: Total assistance;+2 for physical assistance;Bed level Toileting - Clothing Manipulation  Details (indicate cue type and reason): TOTAL A for rolling on bedpan and initiation of peri-care       General ADL Comments: Significantly decreased tolerance to  activity, pt generally weak and deconditioned. Requires significant time to attempt to move towards EOB and initiate standing.     Perception Perception Perception: Impaired (Decreased orientation to midline)   Praxis      Cognition Arousal/Alertness: Awake/alert Behavior During Therapy: Flat affect Overall Cognitive Status: Within Functional Limits for tasks assessed                                 General Comments: HOH, increased time needed to follow commands.        Exercises Other Exercises Other Exercises: Rolling to R for bed pan placement and max assist for hygeien.            Pertinent Vitals/ Pain       Pain Assessment Pain Assessment: No/denies pain   Frequency  Min 1X/week        Progress Toward Goals  OT Goals(current goals can now be found in the care plan section)  Progress towards OT goals: Progressing toward goals  Acute Rehab OT Goals OT Goal Formulation: With patient Time For Goal Achievement: 09/09/22 Potential to Achieve Goals: Good  Plan Discharge plan remains appropriate;Frequency remains appropriate    Co-evaluation    PT/OT/SLP Co-Evaluation/Treatment: Yes Reason for Co-Treatment: Necessary to address cognition/behavior during functional activity;For patient/therapist safety;To address functional/ADL transfers PT goals addressed during session: Mobility/safety with mobility;Balance OT goals addressed during session: ADL's and self-care;Proper use of Adaptive equipment and DME      AM-PAC OT "6 Clicks" Daily Activity     Outcome Measure   Help from another person eating meals?: A Little Help from another person taking care of personal grooming?: A Lot Help from another person toileting, which includes using toliet, bedpan, or urinal?: A Lot Help from another person bathing (including washing, rinsing, drying)?: Total Help from another person to put on and taking off regular upper body clothing?: Total Help from  another person to put on and taking off regular lower body clothing?: Total 6 Click Score: 10    End of Session Equipment Utilized During Treatment: Rolling walker (2 wheels);Gait belt;Oxygen (SpO2% +90% throughout tx)  OT Visit Diagnosis: Unsteadiness on feet (R26.81);Muscle weakness (generalized) (M62.81)   Activity Tolerance Patient limited by fatigue   Patient Left in bed;with call bell/phone within reach;with bed alarm set   Nurse Communication Mobility status;Other (comment)        Time: 1610-9604 OT Time Calculation (min): 36 min  Charges: OT General Charges $OT Visit: 1 Visit OT Treatments $Self Care/Home Management : 8-22 mins  Essie Lagunes L. Rage Beever, OTR/L  09/04/22, 2:41 PM

## 2022-09-04 NOTE — Progress Notes (Signed)
Progress Note   Patient: Brett Riley QIO:[id number] DOB: February 05, 1936 DOA: 08/18/2022     17 DOS: the patient was seen and examined on 09/04/2022   Brief hospital course: Brief Narrative:  87 y.o. male with medical history significant of chronic dCHF, HTN, HLD, CAD, CKD-3a, anemia, chronic A fib on Eliquis, who presents 08/18/2022 to ED from rehab, chief complaint SOB.  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 Net IO Since Admission: -5,549.14 mL [08/20/22 1608]. Heparin stopped d/t rectal bleeding overnight.  06/19: euvolemic per cardio, d/c lasix, O2 requirement still high, CT chest reviewed, ?edema/atypical infection, given clinically worsening respiratory status have asked pulmonary and ID to consult. Have d/c amiodarone and ordered steroids in case amio toxicity.  06/20: ID and pulmonary assisting with the management.   6/22-6/25: patient feels clinically improved. Stable with oxygen via [place] and weaned down to 2-3L. Patient requiring judicial adjustments to balance between hypervolemia and renal injury with diuresis. Unable to pull fluid from lungs with thoracentesis so medical management is best option. His IV Abx to treat his vertebral osteomyelitis from previous admission continue unchanged and managed by ID.    6/28: Bed offered at [facility].  Authorization pending 7/1: No status changes.  Per patient's daughter he has been seeming more depressed.  I have offered outpatient referral to psychiatry.   7/2: Insurance authorization for LTAC continues to be pending  7/3  : Peer-to-peer review done.  Not a candidate for LTAC.  Approved for skilled nursing facility  Assessment and Plan:  Principal Problem:   Acute respiratory failure with hypoxia (HCC) Active Problems:   Acute on chronic heart failure with preserved ejection fraction (HFpEF) (HCC)   MSSA bacteremia   Vertebral osteomyelitis (HCC)   CAD (coronary artery disease)   Myocardial injury   Hypertension   Hyperlipidemia   Chronic kidney disease, stage 3a (HCC)   Normocytic anemia   Atrial fibrillation, chronic (HCC)   Paroxysmal atrial fibrillation (HCC)   HCAP (healthcare-associated pneumonia)   Sepsis (HCC)   Pleural effusion   Amiodarone pulmonary toxicity   Acute on chronic HFrEF (heart failure with reduced ejection fraction) (HCC)   Pleural effusion on right   Status post thoracentesis   Heart failure with preserved ejection fraction (HCC)   Stage 3 chronic kidney disease (HCC)   Acute on chronic respiratory failure with hypoxia due to acute on chronic CHF  possible pneumonia, bilateral small pleural effusion- as seen on chest CT Not on oxygen supplementation at baseline Currently on 3L with O2 saturation in upper 90s. - wean as tolerated -Completed course of azithromycin. - Continue Ancef as written for his osteomyelitis.  Last dose 7/12 - incentive spirometer q1hr - wean steroids -Medically stable for discharge.      Acute on chronic diastolic CHF- Markedly net negative.  Appears euvolemic Plan: Lasix 40 mg PO daily TED hose Strict I's and O's, daily weights   Pleural effusion- IR unable to perform thoracentesis 6/20, 6/21. Medical management. Clinically improving respiratory status.      Atrial fibrillation, chronic (HCC): Heart rate now well controlled in  60-70s diltiazem, metoprolol per cardiology Lisinopril held with AKI Restarted eliquis (renally dosed) in setting of no new bleeding and patient's hgb has been stable >5 days after concern for GI bleed early in  admission on heparin gtt.   Recent hx of MSSA bacteremia vertebral osteomyelitis (HCC): continue IV Ancef until 7/12. Follow up with ID outpatient 7/11 repeat Blood culture NGTD   CAD  Myocardial injury  HLD:  trop  258, possibly due to demand ischemia secondary to CHF exacerbation.  Patient does not have chest pain. Crestor, beta blocker, Aspirin 81 discontinued as he's on eliquis  Cardiology following  May need coronary angiography at some point but will defer for now     AKI on Chronic kidney disease, stage 3a [facility]): Cr stable 2.4>2.4 even with use of diuretics. Baseline creatinine 1.5-1.9 Follow BMP Nephrology following     Normocytic anemia: hgb stable around 8.5-9. S/p 1 unit PRBC 06/18 ABLA w/ rectal bleed   Hypertension Continue to closely monitor vital signs.   Resolved hypokalemia   GERD PPI    Right knee pain -X-ray with chronic arthritic changes.  Recommend frequent mobility                Subjective: Seen and examined at bedside.  Complains of feeling very weak.  Wife is frustrated about his long hospitalization.  Physical Exam: Vitals:   09/04/22 0345 09/04/22 0746 09/04/22 1257 09/04/22 1547  BP: 110/70 (!) 143/71 (!) 153/72 126/61  Pulse: 65 66 67 61  Resp: 16 16 16 16   Temp: 97.8 F (36.6 C) 97.8 F (36.6 C) 97.6 F (36.4 C) 97.9 F (36.6 C)  TempSrc: Oral Oral Oral Oral  SpO2: 99% 100% 100% 100%  Weight:      Height:       General exam: NAD.  Lying in bed.  Appears fatigued Respiratory system: Right-sided crackles.  Normal work of breathing.  3 L Cardiovascular system: S1-S2, RRR, no murmurs, no pedal edema Gastrointestinal system: Soft, NT/ND, normal bowel sounds Central nervous system: Alert and oriented. No focal neurological deficits. Extremities: Right lower extremity decreased ROM Skin: No rashes, lesions or ulcers Psychiatry: Judgement and insight appear normal. Mood & affect flattened.   Data Reviewed:  There are no new  results to review at this time.  Family Communication: Discussed plan of care with patient's wife at the bedside.  Informed her that her husband is not a candidate for an LTAC but has been approved for skilled nursing facility once medically stable  Disposition: Status is: Inpatient Remains inpatient appropriate because: Awaiting discharge  Planned Discharge Destination: Skilled nursing facility    Time spent: 38  minutes  Author: Lucile Shutters, MD 09/04/2022 4:07 PM  For on call review [email or web address].

## 2022-09-04 NOTE — TOC Progression Note (Addendum)
Transition of Care [facility]) - Progression Note    Patient Details  Name: Brett Riley MRN: [id number] Date of Birth: [birth year]  Transition of Care [facility]) CM/SW Contact  Truddie Hidden, RN Phone Number: 09/04/2022, 5:25 PM  Clinical Narrative:    Spoke with patient's wife, Gershon Cull to regarding discharge plan.  RNCM reiterated patient was not approved for [facility] rather SNF.  Patient wife advised of patient's current bed offers for the following facilities and locations:  [facility] Preferred SNF  Accepted N/A [address] [place] [address]     [facility]erred SNF  Accepted N/A [address], [place] [address]     [facility]  Accepted N/A [address], [place] [address]     [facility]  Accepted N/A [address] [place] [address]     [facility]  Accepted N/A [address] [place] [address]     [facility] Preferred SNF  Accepted N/A [address], [place] [address]     [facility]  Accepted N/A [address] [place] [address]     [facility]  Accepted N/A [address] [place] [address]     [facility] Preferred SNF  Accepted N/A [address] [place] [address]     [facility]  Accepted N/A [address] [place] [address]     [facility]  Accepted        She was provided the Medicare.gov website for a reference to star ratings as well as encouraged to visit any facilities of interest. Gershon Cull was advised patient's [name] is approved per M. She was informed  his full medicare coverage days may be limited due to his most recent admission to [facility]. RNCM explained after the 20th day of SNF patient will be responsible for co payment days.  Gershon Cull asked  about facilities in [place]. She was  advised all facilities in [facility] were sent referrals and have declined bed offers based on availability, OON status, or inability to care for the patient. Family is not agreeable to [facility]. Gershon Cull inquired about a discharge date. She has been advised this is at the MD 's discretion. MD and Unit Director notified of conversation and that patient has bed offers.   Expected Discharge Plan: Skilled Nursing Facility Barriers to Discharge: Continued Medical Work up, English as a second language teacher  Expected Discharge Plan and Services     Post Acute Care Choice: Skilled Nursing Facility Living arrangements for the past 2 months: Single Family Home                                       Social Determinants of Health (SDOH) Interventions SDOH Screenings   Food Insecurity: No Food Insecurity (08/18/2022)  Housing: Low Risk  (08/18/2022)  Transportation Needs: No Transportation Needs (08/18/2022)  Utilities: Not At Risk (08/18/2022)  Depression (PHQ2-9): Low Risk  (06/06/2022)  Financial Resource Strain: Low Risk  (08/29/2020)  Physical Activity: Insufficiently Active (08/29/2020)  Social Connections: Unknown (08/29/2020)  Stress: No Stress Concern Present (08/29/2020)  Tobacco Use: Low Risk  (08/18/2022)    Readmission Risk Interventions     No data to display

## 2022-09-04 NOTE — Progress Notes (Signed)
[facility]  ROUNDING NOTE   Subjective:   Patient sitting up in bed No family present at bedside Tolerating small meals  Creatinine 2.83 (2.97) (2.65) (2.31) (2.18) (2.40) UOP  Objective:  Vital signs in last 24 hours:  Temp:  [97.6 F (36.4 C)-98.3 F (36.8 C)] 97.6 F (36.4 C) (07/03 1257) Pulse Rate:  [62-67] 67 (07/03 1257) Resp:  [16-18] 16 (07/03 1257) BP: (107-153)/(52-72) 153/72 (07/03 1257) SpO2:  [97 %-100 %] 100 % (07/03 1257)  Weight change:  Filed Weights   09/01/22 0447 09/02/22 0546 09/03/22 0516  Weight: 91.4 kg 90.1 kg 88 kg    Intake/Output: I/O last 3 completed shifts: In: 440 [P.O.:240; IV Piggyback:200] Out: 2475 [Urine:2475]   Intake/Output this shift:  Total I/O In: -  Out: 500 [Urine:500]  Physical Exam: General: NAD, laying in bed  Head: Normocephalic, atraumatic. Moist oral mucosal membranes  Eyes: Anicteric  Lungs:  Diminished, 2L [place]  Heart: Regular rate and rhythm  Abdomen:  Soft, nontender  Extremities:  ++ peripheral edema.  Neurologic: Alert and oriented, moving all four extremities  Skin: No lesions  Access: none    Basic Metabolic Panel: Recent Labs  Lab 08/30/22 0640 09/01/22 0500 09/02/22 0930 09/03/22 1000  NA 133* 134* 134* 137  K 3.6 3.7 3.5 3.1*  CL 97* 99 99 101  CO2 26 25 23 26   GLUCOSE 105* 132* 120* 152*  BUN 97* 93* 100* 98*  CREATININE 2.31* 2.65* 2.97* 2.83*  CALCIUM 8.2* 8.1* 8.2* 8.3*     Liver Function Tests: Recent Labs  Lab 09/02/22 1800  ALBUMIN 2.2*    No results for input(s): "LIPASE", "AMYLASE" in the last 168 hours. No results for input(s): "AMMONIA" in the last 168 hours.  CBC: Recent Labs  Lab 08/29/22 0500 08/30/22 0640 09/02/22 0930  WBC 24.6* 18.3* 8.0  NEUTROABS  --  16.9* 6.7  HGB 8.7* 8.4* 8.9*  HCT 26.6* 25.8* 27.4*  MCV 93.0 92.1 91.9  PLT 253 250 236     Cardiac Enzymes: No results for input(s): "CKTOTAL", "CKMB", "CKMBINDEX",  "TROPONINI" in the last 168 hours.   BNP: Invalid input(s): "POCBNP"  CBG: Recent Labs  Lab 09/02/22 0827  GLUCAP 92     Microbiology: Results for orders placed or performed during the hospital encounter of 08/18/22  Blood culture (routine x 2)     Status: None   Collection Time: 08/18/22 10:45 AM   Specimen: BLOOD LEFT ARM  Result Value Ref Range Status   Specimen Description BLOOD LEFT ARM  Final   Special Requests   Final    BOTTLES DRAWN AEROBIC AND ANAEROBIC Blood Culture adequate volume   Culture   Final    NO GROWTH 5 DAYS Performed at [facility], [address], [place], [place] [address]    Report Status 08/23/2022 FINAL  Final  Resp Panel by RT-PCR (Flu A&B, Covid) Anterior Nasal Swab     Status: None   Collection Time: 08/18/22 10:46 AM   Specimen: Anterior Nasal Swab  Result Value Ref Range Status   SARS Coronavirus 2 by RT PCR NEGATIVE NEGATIVE Final    Comment: (NOTE) SARS-CoV-2 target nucleic acids are NOT DETECTED.  The SARS-CoV-2 RNA is generally detectable in upper respiratory specimens during the acute phase of infection. The lowest concentration of SARS-CoV-2 viral copies this assay can detect is 138 copies/mL. A negative result does not preclude SARS-Cov-2 infection and should not be used as the sole  basis for treatment or other patient management decisions. A negative result may occur with  improper specimen collection/handling, submission of specimen other than nasopharyngeal swab, presence of viral mutation(s) within the areas targeted by this assay, and inadequate number of viral copies(<138 copies/mL). A negative result must be combined with clinical observations, patient history, and epidemiological information. The expected result is Negative.  Fact Sheet for Patients:  BloggerCourse.com  Fact Sheet for Healthcare Providers:  SeriousBroker.it  This test is no t yet approved  or cleared by the [place] FDA and  has been authorized for detection and/or diagnosis of SARS-CoV-2 by FDA under an Emergency Use Authorization (EUA). This EUA will remain  in effect (meaning this test can be used) for the duration of the COVID-19 declaration under Section 564(b)(1) of the Act, 21 U.S.C.section 360bbb-3(b)(1), unless the authorization is terminated  or revoked sooner.       Influenza A by PCR NEGATIVE NEGATIVE Final   Influenza B by PCR NEGATIVE NEGATIVE Final    Comment: (NOTE) The Xpert Xpress SARS-CoV-2/FLU/RSV plus assay is intended as an aid in the diagnosis of influenza from Nasopharyngeal swab specimens and should not be used as a sole basis for treatment. Nasal washings and aspirates are unacceptable for Xpert Xpress SARS-CoV-2/FLU/RSV testing.  Fact Sheet for Patients: BloggerCourse.com  Fact Sheet for Healthcare Providers: SeriousBroker.it  This test is not yet approved or cleared by the [place] FDA and has been authorized for detection and/or diagnosis of SARS-CoV-2 by FDA under an Emergency Use Authorization (EUA). This EUA will remain in effect (meaning this test can be used) for the duration of the COVID-19 declaration under Section 564(b)(1) of the Act, 21 U.S.C. section 360bbb-3(b)(1), unless the authorization is terminated or revoked.  Performed at [facility], [address] [place], [place] [address]   Culture, blood (Routine X 2) w Reflex to ID Panel     Status: None   Collection Time: 08/18/22  9:19 PM   Specimen: BLOOD LEFT ARM  Result Value Ref Range Status   Specimen Description BLOOD LEFT ARM  Final   Special Requests   Final    BOTTLES DRAWN AEROBIC AND ANAEROBIC Blood Culture adequate volume   Culture   Final    NO GROWTH 5 DAYS Performed at [facility], [address] [place], [place] [address]    Report Status 08/23/2022 FINAL  Final   Respiratory (~20 pathogens) panel by PCR     Status: None   Collection Time: 08/21/22  1:50 PM   Specimen: Nasopharyngeal Swab; Respiratory  Result Value Ref Range Status   Adenovirus NOT DETECTED NOT DETECTED Final   Coronavirus 229E NOT DETECTED NOT DETECTED Final    Comment: (NOTE) The Coronavirus on the Respiratory Panel, DOES NOT test for the novel  Coronavirus (2019 nCoV)    Coronavirus HKU1 NOT DETECTED NOT DETECTED Final   Coronavirus NL63 NOT DETECTED NOT DETECTED Final   Coronavirus OC43 NOT DETECTED NOT DETECTED Final   Metapneumovirus NOT DETECTED NOT DETECTED Final   Rhinovirus / Enterovirus NOT DETECTED NOT DETECTED Final   Influenza A NOT DETECTED NOT DETECTED Final   Influenza B NOT DETECTED NOT DETECTED Final   Parainfluenza Virus 1 NOT DETECTED NOT DETECTED Final   Parainfluenza Virus 2 NOT DETECTED NOT DETECTED Final   Parainfluenza Virus 3 NOT DETECTED NOT DETECTED Final   Parainfluenza Virus 4 NOT DETECTED NOT DETECTED Final   Respiratory Syncytial Virus NOT DETECTED NOT DETECTED Final  Bordetella pertussis NOT DETECTED NOT DETECTED Final   Bordetella Parapertussis NOT DETECTED NOT DETECTED Final   Chlamydophila pneumoniae NOT DETECTED NOT DETECTED Final   Mycoplasma pneumoniae NOT DETECTED NOT DETECTED Final    Comment: Performed at [facility], [address] [place], [place] [address]  Culture, blood (Routine X 2) w Reflex to ID Panel     Status: None   Collection Time: 08/21/22  8:58 PM   Specimen: BLOOD  Result Value Ref Range Status   Specimen Description BLOOD LEFT WRIST  Final   Special Requests   Final    BOTTLES DRAWN AEROBIC AND ANAEROBIC Blood Culture adequate volume   Culture   Final    NO GROWTH 5 DAYS Performed at [facility], [address] [place], [place] [address]    Report Status 08/26/2022 FINAL  Final  Culture, blood (Routine X 2) w Reflex to ID Panel     Status: None   Collection Time: 08/21/22 11:01 PM    Specimen: BLOOD  Result Value Ref Range Status   Specimen Description BLOOD BLOOD LEFT ARM  Final   Special Requests   Final    BOTTLES DRAWN AEROBIC AND ANAEROBIC Blood Culture adequate volume   Culture   Final    NO GROWTH 5 DAYS Performed at [facility], [address], [place], [place] [address]    Report Status 08/26/2022 FINAL  Final  Expectorated Sputum Assessment w Gram Stain, Rflx to Resp Cult     Status: None   Collection Time: 08/22/22 11:22 AM   Specimen: Sputum  Result Value Ref Range Status   Specimen Description SPUTUM  Final   Special Requests EXPSU  Final   Sputum evaluation   Final    THIS SPECIMEN IS ACCEPTABLE FOR SPUTUM CULTURE Performed at [facility], [address], [place], [place] [address]    Report Status 08/22/2022 FINAL  Final  Culture, Respiratory w Gram Stain     Status: None   Collection Time: 08/22/22 11:22 AM   Specimen: SPU  Result Value Ref Range Status   Specimen Description   Final    SPUTUM Performed at [facility], [address], [place], [place] [address]    Special Requests   Final    EXPSU Reflexed from [id number] Performed at [facility], [address] [place], [place] [address]    Gram Stain   Final    ABUNDANT SQUAMOUS EPITHELIAL CELLS PRESENT FEW WBC PRESENT, PREDOMINANTLY PMN ABUNDANT GRAM VARIABLE ROD ABUNDANT GRAM POSITIVE COCCI IN PAIRS    Culture   Final    Normal respiratory flora-no Staph aureus or Pseudomonas seen Performed at [facility], [address] [place], [place] [address]    Report Status 08/25/2022 FINAL  Final    Coagulation Studies: No results for input(s): "LABPROT", "INR" in the last 72 hours.  Urinalysis: Recent Labs    09/02/22 1330  COLORURINE YELLOW*  LABSPEC 1.010  PHURINE 5.0  GLUCOSEU NEGATIVE  HGBUR MODERATE*  BILIRUBINUR NEGATIVE  KETONESUR NEGATIVE  PROTEINUR 30*  NITRITE NEGATIVE  LEUKOCYTESUR NEGATIVE       Imaging: No  results found.   Medications:    ceFAZolin 2 g (09/04/22 1149)    apixaban  2.5 mg Oral BID   Chlorhexidine Gluconate Cloth  6 each Topical Daily   cyanocobalamin  1,000 mcg Oral Daily   diltiazem  360 mg Oral Daily   feeding supplement  237  mL Oral BID BM   furosemide  40 mg Oral Daily   metoprolol succinate  100 mg Oral Daily   multivitamins with iron  1 tablet Oral Daily   pantoprazole  40 mg Oral Daily   polyethylene glycol  17 g Oral Daily   rosuvastatin  40 mg Oral Daily   senna  1 tablet Oral Daily   acetaminophen, albuterol, dextromethorphan-guaiFENesin, melatonin, ondansetron (ZOFRAN) IV, simethicone, sodium chloride, traMADol  Assessment/ Plan:  Brett Riley is a 87 y.o.  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  Acute kidney injury with proteinuria and hematuria: Baseline creatinine of 1.15 with normal GFR on 5/29. Serologic work up pending. Serum complements normal.  ATN and prerenal azotemia from overdiuresis. No obstruction on renal ultrasound. No recent IV contrast exposure. Nonoliguric urine output. No acute indication for dialysis.  - holding lisinopril - 1500 mL of urine output recorded. - Continue daily furosemide -UA improved since admission, decreased proteinuria however moderate hematuria remains. -Received 2 doses of Albumin   2.  Acute on chronic diastolic heart failure.  Ejection fraction 55 to 60% with grade 1 diastolic dysfunction noted.  - Continue oral furosemide 40mg  daily -  appreciate cardiology input.   3.  Anemia with acute kidney injury: PRBC transfusion on 6/18. No new labs today    LOS: 17   7/3/20241:31 PM

## 2022-09-04 NOTE — TOC Progression Note (Signed)
Transition of Care [facility]) - Progression Note    Patient Details  Name: Brett Riley MRN: [id number] Date of Birth: Mar 08, 1935  [facility] of [facility]) CM/SW Contact  Margarito Liner, LCSW Phone Number: 09/04/2022, 12:16 PM  Clinical Narrative:   CSW updated wife regarding peer-to-peer and potential for appeal if they continue to deny. CSW expanding SNF search in case he is still denied after appeal.  Expected Discharge Plan: Skilled Nursing Facility Barriers to Discharge: Continued Medical Work up, English as a second language teacher  Expected Discharge Plan and Services     Post Acute Care Choice: Skilled Nursing Facility Living arrangements for the past 2 months: Single Family Home                                       Social Determinants of Health (SDOH) Interventions SDOH Screenings   Food Insecurity: No Food Insecurity (08/18/2022)  Housing: Low Risk  (08/18/2022)  Transportation Needs: No Transportation Needs (08/18/2022)  Utilities: Not At Risk (08/18/2022)  Depression (PHQ2-9): Low Risk  (06/06/2022)  Financial Resource Strain: Low Risk  (08/29/2020)  Physical Activity: Insufficiently Active (08/29/2020)  Social Connections: Unknown (08/29/2020)  Stress: No Stress Concern Present (08/29/2020)  Tobacco Use: Low Risk  (08/18/2022)    Readmission Risk Interventions     No data to display

## 2022-09-04 NOTE — Telephone Encounter (Signed)
Unfortunately Dr. Wyn Quaker is not available as he is currently on vacation, however if his current care team feels that vascular intervention will assist with patient's recovery they can request consultation.  However if his issues are not vascular in nature, we likely will not have anything to offer.  Dr. Wyn Quaker will return on Monday

## 2022-09-04 NOTE — Care Management Important Message (Signed)
Important Message  Patient Details  Name: Brett Riley MRN: [id number] Date of Birth: [birth year]   Medicare Important Message Given:  Yes     [name] 09/04/2022, 10:51 AM

## 2022-09-04 NOTE — TOC Progression Note (Signed)
Transition of Care [facility]) - Progression Note    Patient Details  Name: Brett Riley MRN: [id number] Date of Birth: [birth year]  Transition [facility]) CM/SW Contact  Truddie Hidden, RN Phone Number: 09/04/2022, 11:34 AM  Clinical Narrative:    Per [facility] Liaison Claudean Severance email the following email was received stating patient was  denied "Due to acute condition and not being on a ventilator. A peer-to-peer (P2P) offer expires at 16:30 today. MD can call [phone]."  MD notified by Adair Laundry.    Expected Discharge Plan: Skilled Nursing Facility Barriers to Discharge: Continued Medical Work up, English as a second language teacher  Expected Discharge Plan and Services     Post Acute Care Choice: Skilled Nursing Facility Living arrangements for the past 2 months: Single Family Home                                       Social Determinants of Health (SDOH) Interventions SDOH Screenings   Food Insecurity: No Food Insecurity (08/18/2022)  Housing: Low Risk  (08/18/2022)  Transportation Needs: No Transportation Needs (08/18/2022)  Utilities: Not At Risk (08/18/2022)  Depression (PHQ2-9): Low Risk  (06/06/2022)  Financial Resource Strain: Low Risk  (08/29/2020)  Physical Activity: Insufficiently Active (08/29/2020)  Social Connections: Unknown (08/29/2020)  Stress: No Stress Concern Present (08/29/2020)  Tobacco Use: Low Risk  (08/18/2022)    Readmission Risk Interventions     No data to display

## 2022-09-04 NOTE — Telephone Encounter (Signed)
Patient wife called in requesting a call back from MD, Festus Barren. Since patient had stints put in he has been hospital bound. She is wanting to know if Dr. Wyn Quaker will gp and see patient as maybe he can help with recovery.     Please call and advise

## 2022-09-04 NOTE — Progress Notes (Signed)
Physical Therapy Treatment Patient Details Name: CLENNIE SUNDER MRN: [id number] DOB: 03/25/1935 Today's Date: 09/04/2022   History of Present Illness Froylan Marolt is a 87 y/o M admitted with respiratory failure; other PMH includes MI, CHF, a-fib, HTN, anemia. Was at SNF doing rehab prior to admission; was previously admitted 5/18-6/7.    PT Comments  Patient supine in bed with wife in room and willing to work. Session completed by PT and OT co-treat.  Supine>sit  HOB elevated mod A for trunk control and LE's. Sitting EOB with b/l UE support and feet supported, min-mod A due to right posterior lean. Seated marching x 10 EOB to prepare for standing. STS total assist +2, unable to completely come in to standing with 2 trials. Long rest breaks needed for fatigue. O2 remained 95 and above on 2L. Sit>supine max assist+2 for LE and trunk control due to fatigue. Large BM while EOB, patient rolled min guard to place on bed pan. Max assist for hygiene. Nursing notified of patient on bed pan, call bell in reach and bed alarm set.     Assistance Recommended at Discharge Frequent or constant Supervision/Assistance  If plan is discharge home, recommend the following:  Can travel by private vehicle    Assistance with cooking/housework;Assist for transportation;Two people to help with walking and/or transfers;Two people to help with bathing/dressing/bathroom;Help with stairs or ramp for entrance   No  Equipment Recommendations  None recommended by PT    Recommendations for Other Services       Precautions / Restrictions Precautions Precautions: Fall Restrictions Weight Bearing Restrictions: No     Mobility  Bed Mobility Overal bed mobility: Needs Assistance Bed Mobility: Supine to Sit, Sit to Supine, Rolling Rolling: Min guard   Supine to sit: +2 for physical assistance, HOB elevated, Mod assist Sit to supine: +2 for physical assistance, Max assist   General bed mobility comments: assistance  with trunk support and b/l LEs    Transfers Overall transfer level: Needs assistance Equipment used: Rolling walker (2 wheels) Transfers: Sit to/from Stand Sit to Stand: Total assist, +2 physical assistance           General transfer comment: elevated bed and total assist to come in to standing. Pt struggled to come into complete standing.    Ambulation/Gait               General Gait Details: unable   Stairs             Wheelchair Mobility     Tilt Bed    Modified Rankin (Stroke Patients Only)       Balance Overall balance assessment: Needs assistance Sitting-balance support: Feet supported, Bilateral upper extremity supported, Single extremity supported Sitting balance-Leahy Scale: Poor Sitting balance - Comments: predominantly min-mod A with small bouts of CGA. Consistent posterior right lean that is corrected with cueing. Postural control: Right lateral lean, Posterior lean Standing balance support: Bilateral upper extremity supported, Reliant on assistive device for balance Standing balance-Leahy Scale: Zero Standing balance comment: total assist from PT/OT to stand with RW, unable to completely come in to standing.                            Cognition Arousal/Alertness: Awake/alert Behavior During Therapy: Flat affect Overall Cognitive Status: Within Functional Limits for tasks assessed  General Comments: HOH, increased time needed to follow commands.        Exercises General Exercises - Lower Extremity Hip Flexion/Marching: Seated, AROM, Both, 10 reps    General Comments        Pertinent Vitals/Pain Pain Assessment Pain Assessment: No/denies pain    Home Living                          Prior Function            PT Goals (current goals can now be found in the care plan section) Acute Rehab PT Goals Patient Stated Goal: get stronger PT Goal Formulation: With  patient/family Time For Goal Achievement: 09/10/22 Potential to Achieve Goals: Fair Progress towards PT goals: Progressing toward goals    Frequency    Min 2X/week      PT Plan Discharge plan needs to be updated    Co-evaluation   Reason for Co-Treatment: Necessary to address cognition/behavior during functional activity;For patient/therapist safety;To address functional/ADL transfers PT goals addressed during session: Mobility/safety with mobility;Balance OT goals addressed during session: ADL's and self-care;Proper use of Adaptive equipment and DME      AM-PAC PT "6 Clicks" Mobility   Outcome Measure  Help needed turning from your back to your side while in a flat bed without using bedrails?: Total Help needed moving from lying on your back to sitting on the side of a flat bed without using bedrails?: Total Help needed moving to and from a bed to a chair (including a wheelchair)?: Total Help needed standing up from a chair using your arms (e.g., wheelchair or bedside chair)?: Total Help needed to walk in hospital room?: Total Help needed climbing 3-5 steps with a railing? : Total 6 Click Score: 6    End of Session Equipment Utilized During Treatment: Gait belt;Oxygen Activity Tolerance: Patient limited by fatigue Patient left: in bed;with call bell/phone within reach;with bed alarm set Nurse Communication: Other (comment) PT Visit Diagnosis: Muscle weakness (generalized) (M62.81);Difficulty in walking, not elsewhere classified (R26.2);Unsteadiness on feet (R26.81);Other abnormalities of gait and mobility (R26.89)     Time: 1610-9604 PT Time Calculation (min) (ACUTE ONLY): 36 min  Charges:                            Malachi Carl, SPT    Malachi Carl 09/04/2022, 12:44 PM

## 2022-09-04 NOTE — Plan of Care (Signed)
°  Problem: Education: °Goal: Ability to demonstrate management of disease process will improve °Outcome: Progressing °Goal: Ability to verbalize understanding of medication therapies will improve °Outcome: Progressing °Goal: Individualized Educational Video(s) °Outcome: Progressing °  °Problem: Activity: °Goal: Capacity to carry out activities will improve °Outcome: Progressing °  °Problem: Cardiac: °Goal: Ability to achieve and maintain adequate cardiopulmonary perfusion will improve °Outcome: Progressing °  °Problem: Activity: °Goal: Ability to tolerate increased activity will improve °Outcome: Progressing °  °Problem: Clinical Measurements: °Goal: Ability to maintain a body temperature in the normal range will improve °Outcome: Progressing °  °Problem: Respiratory: °Goal: Ability to maintain adequate ventilation will improve °Outcome: Progressing °Goal: Ability to maintain a clear airway will improve °Outcome: Progressing °  °Problem: Education: °Goal: Knowledge of General Education information will improve °Description: Including pain rating scale, medication(s)/side effects and non-pharmacologic comfort measures °Outcome: Progressing °  °Problem: Health Behavior/Discharge Planning: °Goal: Ability to manage health-related needs will improve °Outcome: Progressing °  °Problem: Clinical Measurements: °Goal: Ability to maintain clinical measurements within normal limits will improve °Outcome: Progressing °Goal: Will remain free from infection °Outcome: Progressing °Goal: Diagnostic test results will improve °Outcome: Progressing °Goal: Respiratory complications will improve °Outcome: Progressing °Goal: Cardiovascular complication will be avoided °Outcome: Progressing °  °Problem: Activity: °Goal: Risk for activity intolerance will decrease °Outcome: Progressing °  °Problem: Nutrition: °Goal: Adequate nutrition will be maintained °Outcome: Progressing °  °Problem: Coping: °Goal: Level of anxiety will  decrease °Outcome: Progressing °  °Problem: Elimination: °Goal: Will not experience complications related to bowel motility °Outcome: Progressing °Goal: Will not experience complications related to urinary retention °Outcome: Progressing °  °Problem: Pain Managment: °Goal: General experience of comfort will improve °Outcome: Progressing °  °Problem: Safety: °Goal: Ability to remain free from injury will improve °Outcome: Progressing °  °Problem: Skin Integrity: °Goal: Risk for impaired skin integrity will decrease °Outcome: Progressing °  °

## 2022-09-05 ENCOUNTER — Inpatient Hospital Stay: Payer: Medicare HMO

## 2022-09-05 ENCOUNTER — Encounter: Payer: Self-pay | Admitting: Internal Medicine

## 2022-09-05 DIAGNOSIS — Z7189 Other specified counseling: Secondary | ICD-10-CM | POA: Diagnosis not present

## 2022-09-05 DIAGNOSIS — Z515 Encounter for palliative care: Secondary | ICD-10-CM

## 2022-09-05 DIAGNOSIS — J9601 Acute respiratory failure with hypoxia: Secondary | ICD-10-CM | POA: Diagnosis not present

## 2022-09-05 LAB — BASIC METABOLIC PANEL WITH GFR
Anion gap: 11 (ref 5–15)
BUN: 95 mg/dL — ABNORMAL HIGH (ref 8–23)
CO2: 23 mmol/L (ref 22–32)
Calcium: 8.2 mg/dL — ABNORMAL LOW (ref 8.9–10.3)
Chloride: 101 mmol/L (ref 98–111)
Creatinine, Ser: 3.09 mg/dL — ABNORMAL HIGH (ref 0.61–1.24)
GFR, Estimated: 19 mL/min — ABNORMAL LOW
Glucose, Bld: 98 mg/dL (ref 70–99)
Potassium: 3 mmol/L — ABNORMAL LOW (ref 3.5–5.1)
Sodium: 135 mmol/L (ref 135–145)

## 2022-09-05 MED ORDER — ALBUMIN HUMAN 25 % IV SOLN
12.5000 g | Freq: Every day | INTRAVENOUS | Status: AC
  Administered 2022-09-05 – 2022-09-07 (×3): 12.5 g via INTRAVENOUS
  Filled 2022-09-05 (×3): qty 50

## 2022-09-05 MED ORDER — POTASSIUM CHLORIDE CRYS ER 20 MEQ PO TBCR
40.0000 meq | EXTENDED_RELEASE_TABLET | ORAL | Status: AC
  Administered 2022-09-05 (×2): 40 meq via ORAL
  Filled 2022-09-05 (×2): qty 2

## 2022-09-05 NOTE — Progress Notes (Signed)
Progress Note   Patient: Brett Riley [name] DOB: Jun 17, 1935 DOA: 08/18/2022     18 DOS: the patient was seen and examined on 09/05/2022   Brief hospital course:  87 y.o. male with medical history significant of chronic dCHF, HTN, HLD, CAD, CKD-3a, anemia, chronic A fib on Eliquis, who presents 08/18/2022 to ED from rehab, chief complaint SOB.  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 Net IO Since Admission: -5,549.14 mL [08/20/22 1608]. Heparin stopped d/t rectal bleeding overnight.  06/19: euvolemic per cardio, d/c lasix, O2 requirement still high, CT chest reviewed, ?edema/atypical infection, given clinically worsening respiratory status have asked pulmonary and ID to consult. Have d/c amiodarone and ordered steroids in case amio toxicity.  06/20: ID and pulmonary assisting with the management.   6/22-6/25: patient feels clinically improved. Stable with oxygen via [place] and weaned down to 2-3L. Patient requiring judicial adjustments to balance between hypervolemia and renal injury with diuresis. Unable to pull fluid from lungs with thoracentesis so medical management is best option. His IV Abx to treat his vertebral osteomyelitis from previous admission continue unchanged and managed by ID.    6/28: Bed offered at [facility].  Authorization pending 7/1: No status changes.  Per patient's daughter he has been seeming more depressed.  I have offered outpatient referral to psychiatry.   7/2: Insurance authorization for LTAC continues to be pending   7/3 : Peer-to-peer  review done.  Not a candidate for LTAC.  Approved for skilled nursing facility patient   Assessment and Plan:  Principal Problem:   Acute respiratory failure with hypoxia (HCC) Active Problems:   Acute on chronic heart failure with preserved ejection fraction (HFpEF) (HCC)   MSSA bacteremia   Vertebral osteomyelitis (HCC)   CAD (coronary artery disease)   Myocardial injury   Hypertension   Hyperlipidemia   Chronic kidney disease, stage 3a (HCC)   Normocytic anemia   Atrial fibrillation, chronic (HCC)   Paroxysmal atrial fibrillation (HCC)   HCAP (healthcare-associated pneumonia)   Sepsis (HCC)   Pleural effusion   Amiodarone pulmonary toxicity   Acute on chronic HFrEF (heart failure with reduced ejection fraction) (HCC)   Pleural effusion on right   Status post thoracentesis   Heart failure with preserved ejection fraction (HCC)   Stage 3 chronic kidney disease (HCC)   Acute on chronic respiratory failure with hypoxia due to acute on chronic CHF  possible pneumonia, bilateral small pleural effusion- as seen on chest CT Not on oxygen supplementation at baseline Currently on 3L with O2 saturation in upper 90s. - wean as tolerated -Completed course of azithromycin. - Continue Ancef as written for his osteomyelitis.  Last dose 7/12 - incentive spirometer q1hr - Off steroids -Medically stable for discharge.       Acute on chronic diastolic CHF- Markedly net negative.  Appears euvolemic Plan: Lasix on hold due to worsening renal function TED hose Strict I's and O's, daily weights   Pleural effusion- IR unable to perform thoracentesis 6/20, 6/21. Medical management. Clinically improving respiratory status.      Atrial fibrillation, chronic (HCC): Heart  rate now well controlled in 60-70s diltiazem, metoprolol per cardiology Lisinopril held with AKI Restarted eliquis (renally dosed) in setting of no new bleeding and patient's hgb has been stable >5 days after concern for  GI bleed early in admission on heparin gtt.   Recent hx of MSSA bacteremia vertebral osteomyelitis (HCC): continue IV Ancef until 7/12. Follow up with ID outpatient 7/11 repeat Blood culture NGTD   CAD  Myocardial injury  HLD:  trop  258, possibly due to demand ischemia secondary to CHF exacerbation.  Patient does not have chest pain. Crestor, beta blocker, Aspirin 81 discontinued as he's on eliquis  Cardiology following  May need coronary angiography at some point but will defer for now     AKI on Chronic kidney disease, stage 3a [facility]): Cr stable 2.4>2.4 even with use of diuretics. Baseline creatinine 1.5-1.9 Follow BMP Nephrology following     Normocytic anemia: hgb stable around 8.5-9. S/p 1 unit PRBC 06/18 ABLA w/ rectal bleed   Hypertension Continue to closely monitor vital signs.   Resolved hypokalemia   GERD PPI    Right knee pain -X-ray with chronic arthritic changes.  Recommend frequent mobility   Hypokalemia Secondary to diuretic therapy.  Supplemented     Subjective: Seen and examined at the bedside.  Wife is at the bedside.  Physical Exam: Vitals:   09/05/22 0356 09/05/22 0357 09/05/22 0843 09/05/22 1243  BP:  (!) 153/61 (!) 152/71 (!) 154/69  Pulse:  64 70 64  Resp:  18 18 18   Temp:  98.3 F (36.8 C) 98.2 F (36.8 C) 98.3 F (36.8 C)  TempSrc:  Oral    SpO2:  99% (!) 88% 100%  Weight: 88.1 kg     Height:       General exam: NAD.  Lying in bed.  Appears fatigued Respiratory system: Right-sided crackles.  Normal work of breathing.  3 L Cardiovascular system: S1-S2, RRR, no murmurs, no pedal edema Gastrointestinal system: Soft, NT/ND, normal bowel sounds Central nervous system: Alert and oriented. No focal neurological deficits. Extremities: Right lower extremity decreased ROM Skin: No rashes, lesions or ulcers Psychiatry: Judgement and insight appear normal. Mood & affect flattened.    Data Reviewed: Labs reviewed.  Noted to have a bump in  his serum creatinine to 3.09.  Potassium 3.0 There are no new results to review at this time.  Family Communication: Discussed patient's condition and plan of care with his wife at the bedside.  All questions and concerns have been addressed.  She verbalizes understanding and agrees with the plan  Disposition: Status is: Inpatient Remains inpatient appropriate because: Awaiting discharge to skilled nursing facility once medically stable  Planned Discharge Destination: Skilled nursing facility    Time spent: 35 minutes  Author: Lucile Shutters, MD 09/05/2022 3:09 PM  For on call review [email or web address].

## 2022-09-05 NOTE — Progress Notes (Signed)
[facility]  ROUNDING NOTE   Subjective:   Patient sitting up in bed Patient's wife is at bedside today. He had bath and feels better.  Continues to have significant lower extremity edema. He received IV furosemide yesterday and urine output was 2200 cc. However, serum creatinine has further increased to 3.1. Tolerating small meals   Objective:  Vital signs in last 24 hours:  Temp:  [97.6 F (36.4 C)-98.3 F (36.8 C)] 98.2 F (36.8 C) (07/04 0843) Pulse Rate:  [61-70] 70 (07/04 0843) Resp:  [16-20] 18 (07/04 0843) BP: (126-153)/(61-72) 152/71 (07/04 0843) SpO2:  [88 %-100 %] 88 % (07/04 0843) Weight:  [88.1 kg] 88.1 kg (07/04 0356)  Weight change:  Filed Weights   09/02/22 0546 09/03/22 0516 09/05/22 0356  Weight: 90.1 kg 88 kg 88.1 kg    Intake/Output: I/O last 3 completed shifts: In: 300 [P.O.:300] Out: 2700 [Urine:2700]   Intake/Output this shift:  Total I/O In: 240 [P.O.:240] Out: -   Physical Exam: General: NAD, laying in bed  Head: Normocephalic, atraumatic. Moist oral mucosal membranes  Eyes: Anicteric  Lungs:  Diminished, 2L [place]  Heart: Regular rate and rhythm  Abdomen:  Soft, nontender  Extremities:  ++ peripheral edema.  Neurologic: Alert and oriented, moving all four extremities  Skin: No lesions  Access: none    Basic Metabolic Panel: Recent Labs  Lab 08/30/22 0640 09/01/22 0500 09/02/22 0930 09/03/22 1000 09/05/22 0355  NA 133* 134* 134* 137 135  K 3.6 3.7 3.5 3.1* 3.0*  CL 97* 99 99 101 101  CO2 26 25 23 26 23   GLUCOSE 105* 132* 120* 152* 98  BUN 97* 93* 100* 98* 95*  CREATININE 2.31* 2.65* 2.97* 2.83* 3.09*  CALCIUM 8.2* 8.1* 8.2* 8.3* 8.2*     Liver Function Tests: Recent Labs  Lab 09/02/22 1800  ALBUMIN 2.2*    No results for input(s): "LIPASE", "AMYLASE" in the last 168 hours. No results for input(s): "AMMONIA" in the last 168 hours.  CBC: Recent Labs  Lab 08/30/22 0640 09/02/22 0930  WBC 18.3* 8.0   NEUTROABS 16.9* 6.7  HGB 8.4* 8.9*  HCT 25.8* 27.4*  MCV 92.1 91.9  PLT 250 236     Cardiac Enzymes: No results for input(s): "CKTOTAL", "CKMB", "CKMBINDEX", "TROPONINI" in the last 168 hours.   BNP: Invalid input(s): "POCBNP"  CBG: Recent Labs  Lab 09/02/22 0827  GLUCAP 92     Microbiology: Results for orders placed or performed during the hospital encounter of 08/18/22  Blood culture (routine x 2)     Status: None   Collection Time: 08/18/22 10:45 AM   Specimen: BLOOD LEFT ARM  Result Value Ref Range Status   Specimen Description BLOOD LEFT ARM  Final   Special Requests   Final    BOTTLES DRAWN AEROBIC AND ANAEROBIC Blood Culture adequate volume   Culture   Final    NO GROWTH 5 DAYS Performed at [facility], [address], [place], [place] [address]    Report Status 08/23/2022 FINAL  Final  Resp Panel by RT-PCR (Flu A&B, Covid) Anterior Nasal Swab     Status: None   Collection Time: 08/18/22 10:46 AM   Specimen: Anterior Nasal Swab  Result Value Ref Range Status   SARS Coronavirus 2 by RT PCR NEGATIVE NEGATIVE Final    Comment: (NOTE) SARS-CoV-2 target nucleic acids are NOT DETECTED.  The SARS-CoV-2 RNA is generally detectable in upper respiratory specimens during the acute phase of  infection. The lowest concentration of SARS-CoV-2 viral copies this assay can detect is 138 copies/mL. A negative result does not preclude SARS-Cov-2 infection and should not be used as the sole basis for treatment or other patient management decisions. A negative result may occur with  improper specimen collection/handling, submission of specimen other than nasopharyngeal swab, presence of viral mutation(s) within the areas targeted by this assay, and inadequate number of viral copies(<138 copies/mL). A negative result must be combined with clinical observations, patient history, and epidemiological information. The expected result is Negative.  Fact Sheet for  Patients:  BloggerCourse.com  Fact Sheet for Healthcare Providers:  SeriousBroker.it  This test is no t yet approved or cleared by the [place] FDA and  has been authorized for detection and/or diagnosis of SARS-CoV-2 by FDA under an Emergency Use Authorization (EUA). This EUA will remain  in effect (meaning this test can be used) for the duration of the COVID-19 declaration under Section 564(b)(1) of the Act, 21 U.S.C.section 360bbb-3(b)(1), unless the authorization is terminated  or revoked sooner.       Influenza A by PCR NEGATIVE NEGATIVE Final   Influenza B by PCR NEGATIVE NEGATIVE Final    Comment: (NOTE) The Xpert Xpress SARS-CoV-2/FLU/RSV plus assay is intended as an aid in the diagnosis of influenza from Nasopharyngeal swab specimens and should not be used as a sole basis for treatment. Nasal washings and aspirates are unacceptable for Xpert Xpress SARS-CoV-2/FLU/RSV testing.  Fact Sheet for Patients: BloggerCourse.com  Fact Sheet for Healthcare Providers: SeriousBroker.it  This test is not yet approved or cleared by the [place] FDA and has been authorized for detection and/or diagnosis of SARS-CoV-2 by FDA under an Emergency Use Authorization (EUA). This EUA will remain in effect (meaning this test can be used) for the duration of the COVID-19 declaration under Section 564(b)(1) of the Act, 21 U.S.C. section 360bbb-3(b)(1), unless the authorization is terminated or revoked.  Performed at [facility], [address] [place], [place] [address]   Culture, blood (Routine X 2) w Reflex to ID Panel     Status: None   Collection Time: 08/18/22  9:19 PM   Specimen: BLOOD LEFT ARM  Result Value Ref Range Status   Specimen Description BLOOD LEFT ARM  Final   Special Requests   Final    BOTTLES DRAWN AEROBIC AND ANAEROBIC Blood Culture adequate volume    Culture   Final    NO GROWTH 5 DAYS Performed at [facility], [address] [place], [place] [address]    Report Status 08/23/2022 FINAL  Final  Respiratory (~20 pathogens) panel by PCR     Status: None   Collection Time: 08/21/22  1:50 PM   Specimen: Nasopharyngeal Swab; Respiratory  Result Value Ref Range Status   Adenovirus NOT DETECTED NOT DETECTED Final   Coronavirus 229E NOT DETECTED NOT DETECTED Final    Comment: (NOTE) The Coronavirus on the Respiratory Panel, DOES NOT test for the novel  Coronavirus (2019 nCoV)    Coronavirus HKU1 NOT DETECTED NOT DETECTED Final   Coronavirus NL63 NOT DETECTED NOT DETECTED Final   Coronavirus OC43 NOT DETECTED NOT DETECTED Final   Metapneumovirus NOT DETECTED NOT DETECTED Final   Rhinovirus / Enterovirus NOT DETECTED NOT DETECTED Final   Influenza A NOT DETECTED NOT DETECTED Final   Influenza B NOT DETECTED NOT DETECTED Final   Parainfluenza Virus 1 NOT DETECTED NOT DETECTED Final   Parainfluenza Virus 2 NOT DETECTED NOT DETECTED Final  Parainfluenza Virus 3 NOT DETECTED NOT DETECTED Final   Parainfluenza Virus 4 NOT DETECTED NOT DETECTED Final   Respiratory Syncytial Virus NOT DETECTED NOT DETECTED Final   Bordetella pertussis NOT DETECTED NOT DETECTED Final   Bordetella Parapertussis NOT DETECTED NOT DETECTED Final   Chlamydophila pneumoniae NOT DETECTED NOT DETECTED Final   Mycoplasma pneumoniae NOT DETECTED NOT DETECTED Final    Comment: Performed at [facility], [address] [place], [place] [address]  Culture, blood (Routine X 2) w Reflex to ID Panel     Status: None   Collection Time: 08/21/22  8:58 PM   Specimen: BLOOD  Result Value Ref Range Status   Specimen Description BLOOD LEFT WRIST  Final   Special Requests   Final    BOTTLES DRAWN AEROBIC AND ANAEROBIC Blood Culture adequate volume   Culture   Final    NO GROWTH 5 DAYS Performed at [facility], [address] [place], [place]  [address]    Report Status 08/26/2022 FINAL  Final  Culture, blood (Routine X 2) w Reflex to ID Panel     Status: None   Collection Time: 08/21/22 11:01 PM   Specimen: BLOOD  Result Value Ref Range Status   Specimen Description BLOOD BLOOD LEFT ARM  Final   Special Requests   Final    BOTTLES DRAWN AEROBIC AND ANAEROBIC Blood Culture adequate volume   Culture   Final    NO GROWTH 5 DAYS Performed at [facility], [address], [place], [place] [address]    Report Status 08/26/2022 FINAL  Final  Expectorated Sputum Assessment w Gram Stain, Rflx to Resp Cult     Status: None   Collection Time: 08/22/22 11:22 AM   Specimen: Sputum  Result Value Ref Range Status   Specimen Description SPUTUM  Final   Special Requests EXPSU  Final   Sputum evaluation   Final    THIS SPECIMEN IS ACCEPTABLE FOR SPUTUM CULTURE Performed at [facility], [address] [place], [place] [address]    Report Status 08/22/2022 FINAL  Final  Culture, Respiratory w Gram Stain     Status: None   Collection Time: 08/22/22 11:22 AM   Specimen: SPU  Result Value Ref Range Status   Specimen Description   Final    SPUTUM Performed at [facility], [address] [place], [place] [address]    Special Requests   Final    EXPSU Reflexed from [id number] Performed at [facility], [address] [place], [place] [address]    Gram Stain   Final    ABUNDANT SQUAMOUS EPITHELIAL CELLS PRESENT FEW WBC PRESENT, PREDOMINANTLY PMN ABUNDANT GRAM VARIABLE ROD ABUNDANT GRAM POSITIVE COCCI IN PAIRS    Culture   Final    Normal respiratory flora-no Staph aureus or Pseudomonas seen Performed at [facility], [address], [place], [place] [address]    Report Status 08/25/2022 FINAL  Final    Coagulation Studies: No results for input(s): "LABPROT", "INR" in the last 72 hours.  Urinalysis: Recent Labs    09/02/22 1330  COLORURINE YELLOW*  LABSPEC 1.010  PHURINE 5.0   GLUCOSEU NEGATIVE  HGBUR MODERATE*  BILIRUBINUR NEGATIVE  KETONESUR NEGATIVE  PROTEINUR 30*  NITRITE NEGATIVE  LEUKOCYTESUR NEGATIVE       Imaging: DG Chest Port 1 View  Result Date: 09/04/2022 CLINICAL DATA:  Congestive heart failure EXAM: PORTABLE CHEST 1 VIEW COMPARISON:  X-ray 08/25/2022 FINDINGS:  Stable right-sided PICC. There persistent patchy bilateral areas of opacity more in the left lung than right. These slightly increasing in the left lung. Increasing area right midlung. No pneumothorax or effusion. Stable enlarged cardiopericardial silhouette with a calcified aorta. Persistent dilated air-filled loops of bowel beneath the left hemidiaphragm. IMPRESSION: Slight increase in patchy left lung opacities left-greater-than-right. Recommend continued follow-up Electronically Signed   By: Karen Kays M.D.   On: 09/04/2022 16:26     Medications:    albumin human 12.5 g (09/05/22 1022)   ceFAZolin 2 g (09/05/22 0906)    apixaban  2.5 mg Oral BID   Chlorhexidine Gluconate Cloth  6 each Topical Daily   cyanocobalamin  1,000 mcg Oral Daily   diltiazem  360 mg Oral Daily   feeding supplement  237 mL Oral BID BM   metoprolol succinate  100 mg Oral Daily   multivitamins with iron  1 tablet Oral Daily   pantoprazole  40 mg Oral Daily   polyethylene glycol  17 g Oral Daily   rosuvastatin  40 mg Oral Daily   senna  1 tablet Oral Daily   acetaminophen, albuterol, dextromethorphan-guaiFENesin, melatonin, ondansetron (ZOFRAN) IV, simethicone, sodium chloride, traMADol  Assessment/ Plan:  Brett Riley is a 87 y.o.  male with recent prolonged admission for aortic ulcer s/p endovascular repair 07/24/22, MSSA bacteremia with L spine osteomyelitis and discitis, chronic diastolic heart failure, hypertension, hyperlipidemia, coronary artery disease, atrial fibrillation who was admitted to [facility] on 08/18/2022 for HCAP (healthcare-associated pneumonia) [J18.9] Acute on chronic respiratory  failure with hypoxia (HCC) [J96.21] Sepsis, due to unspecified organism, unspecified whether acute organ dysfunction present (HCC) [A41.9]     1.  Acute kidney injury with proteinuria and hematuria: Baseline creatinine of 1.15 with normal GFR on 5/29. ANA positive but double-stranded DNA, anti-GBM, c-ANCA p-ANCA, MPO, PR-3, ENA negative.  Complements normal.   Differential diagnosis of AKI is extensive. Patient had pleural effusion/atypical infection/pulmonary edema noted on CT on June 19.  Extensive atherosclerosis and coronary atherosclerosis was noted. No IV contrast exposure recently. Blood pressure has been in the 140s to 150s.  Baseline creatinine of 0.98 from 07/29/2022. It has subsequently continued to increase and is up to 2.97 today. 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 Renal ultrasound on 08/23/2022 negative for obstruction.  AKI likely secondary to ATN.  Other differential includes acute interstitial nephritis. No acute indication for dialysis.  - holding lisinopril -Greater than 2200 mL of urine output recorded. -Hold further IV furosemide.  Try IV albumin -UA improved since admission, decreased proteinuria however moderate hematuria remains.  Patient is also on apixaban. -Obtain renal artery duplex   2.  Acute on chronic diastolic heart failure. With LE edema  Ejection fraction 55 to 60% with grade 1 diastolic dysfunction noted.  - appreciate cardiology input.   3.  Anemia with renal failure: PRBC transfusion on 6/18.   Lab Results  Component Value Date   HGB 8.9 (L) 09/02/2022       LOS: 18 Brett Riley 7/4/202410:50 AM

## 2022-09-05 NOTE — TOC Progression Note (Signed)
Transition of Care [facility]) - Progression Note    Patient Details  Name: Brett Riley MRN: [id number] Date of Birth: [birth year]  Transition of Care [facility]) CM/SW Contact  Truddie Hidden, RN Phone Number: 09/05/2022, 1:56 PM  Clinical Narrative:    Spoke with patient's wife regarding discharge plan and bed offers. She  stated she was not impressed with any of the bed offers given due to ratings posted on Medicare. Gov. She would like to wait closed to discharge to make a decision. RNCM advised discharge planning is proactive and insurance authorizations will have to be obtain. She verbalized her understanding.    Expected Discharge Plan: Skilled Nursing Facility Barriers to Discharge: Continued Medical Work up, English as a second language teacher  Expected Discharge Plan and Services     Post Acute Care Choice: Skilled Nursing Facility Living arrangements for the past 2 months: Single Family Home                                       Social Determinants of Health (SDOH) Interventions SDOH Screenings   Food Insecurity: No Food Insecurity (08/18/2022)  Housing: Low Risk  (08/18/2022)  Transportation Needs: No Transportation Needs (08/18/2022)  Utilities: Not At Risk (08/18/2022)  Depression (PHQ2-9): Low Risk  (06/06/2022)  Financial Resource Strain: Low Risk  (08/29/2020)  Physical Activity: Insufficiently Active (08/29/2020)  Social Connections: Unknown (08/29/2020)  Stress: No Stress Concern Present (08/29/2020)  Tobacco Use: Low Risk  (09/05/2022)    Readmission Risk Interventions     No data to display

## 2022-09-05 NOTE — Consult Note (Signed)
Consultation Note Date: 09/05/2022   Patient Name: Brett Riley  DOB: 1935-09-10  MRN: [id number]  Age / Sex: 87 y.o., male  PCP: Dale Hadar, MD Referring Physician: Lucile Shutters, MD  Reason for Consultation: Establishing goals of care  HPI/Patient Profile: 87 y.o. male  with past medical history of chronic diastolic heart failure, HTN/HLD, CAD, CKD 3, chronic A-fib on Eliquis, anemia, recent admission 5/18 through 6/7 with aortic ulcer repair 5/22 stay complicated by sepsis with MSSA bacteremia and lumbar spine osteomyelitis, [facility]hab admitted on 08/18/2022 with acute respiratory failure with BiPAP in the ED improved.   Clinical Assessment and Goals of Care: I have reviewed medical records including EPIC notes, labs and imaging, received report from RN, assessed the patient.  Brett Riley is lying quietly in bed.  He appears acutely/chronically ill and somewhat frail.  He is able to make his needs known.  His wife, Gershon Cull, is present at bedside.   We meet at the bedside to discuss diagnosis prognosis, GOC, EOL wishes, disposition and options. I introduced Palliative Medicine as specialized medical care for people living with serious illness. It focuses on providing relief from the symptoms and stress of a serious illness. The goal is to improve quality of life for both the patient and the family.  We focused on their current illness.  Overall, Mrs. Vandermeer is very knowledgeable about her husband's acute health concerns.  She is able to accurately share hospitalist evaluation and nephrology evaluation.  She states that he has become very deconditioned while in the hospital.  She talks about the goal of short-term rehab.  She shares her concerns about facility locations.  She tells me that she has been working with transition of care team and has a list of local  rehab facilities that would offer a bed for [name].  The natural disease trajectory and expectations at EOL were discussed.  Advanced directives, concepts specific to code status, artifical feeding and hydration, and rehospitalization were considered and discussed. Noted DNR.   Discussed the importance of continued conversation with family and the medical providers regarding overall plan of care and treatment options, ensuring decisions are within the context of the patient's values and GOCs.  Questions and concerns were addressed.   The family was encouraged to call with questions or concerns.  PMT will continue to support holistically.    HCPOA  NEXT OF KIN - wife Edwing Miyata.      SUMMARY OF RECOMMENDATIONS   At this point continue to treat the treatable but no CPR or intubation Time for outcomes. Anticipate need for short-term rehab with the ultimate goal of returning home.   Code Status/Advance Care Planning: DNR  Symptom Management:  Per hospitalist, no additional needs at this time.  Palliative Prophylaxis:  Frequent Pain Assessment, Oral Care, and Turn Reposition  Additional Recommendations (Limitations, Scope, Preferences): Continue to treat but no CPR or intubation   Psycho-social/Spiritual:  Desire for further Chaplaincy support:no Additional Recommendations: Caregiving  Support/Resources  Prognosis:  Unable to determine, based on outcomes.  Discharge Planning: Anticipate short-term rehab      Primary Diagnoses: Present on Admission:  Acute respiratory failure with hypoxia (HCC)  Hyperlipidemia  CAD (coronary artery disease)  Hypertension  Vertebral osteomyelitis (HCC)  MSSA bacteremia  Myocardial injury  Acute on chronic heart failure with preserved ejection fraction (HFpEF) (HCC)  Chronic kidney disease, stage 3a (HCC)  Normocytic anemia  Atrial fibrillation, chronic (HCC)   I have reviewed the medical record, interviewed the patient and  family, and examined the patient. The following aspects are pertinent.  Past Medical History:  Diagnosis Date   Cancer [facility])    skin cancer basal   Carotid arterial disease (HCC)    a. 02/2016 L CEA; b. 01/2017 U/S: patent LICA, 1-39% RICA.   Celiac artery stenosis (HCC)    Coronary artery disease    a. 01/2016 MV: mild apical/basal inferior, apical lateral, mid anterolateral, and mid inferolateral ischemia. EF 57%; b. 02/2016 Cath: LM 40ost, LAD 70p/m, 20d, D1 95 (small), LCX nl, RCA 30m, RPDA 90 (small), EF 55-65%-->med Rx. Rec CABG for recurrent symptoms.   Hearing loss    History of echocardiogram    a. 02/2016 Echo: EF 50-55%, no rwma, mild MR.   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   Osteoarthritis, hip, bilateral    Left > Right   Social History   Socioeconomic History   Marital status: Married    Spouse name: Not on file   Number of children: 1   Years of education: Not on file   Highest education level: Not on file  Occupational History    Employer: [employer]  Tobacco Use   Smoking status: Never   Smokeless tobacco: Never  Vaping Use   Vaping Use: Never used  Substance and Sexual Activity   Alcohol use: No    Alcohol/week: 0.0 standard drinks of alcohol   Drug use: No   Sexual activity: Not Currently  Other Topics Concern   Not on file  Social History Narrative   Not on file   Social Determinants of Health   Financial Resource Strain: Low Risk  (08/29/2020)   Overall Financial Resource Strain (CARDIA)    Difficulty of Paying Living Expenses: Not hard at all  Food Insecurity: No Food Insecurity (08/18/2022)   Hunger Vital Sign    Worried About Running Out of Food in the Last Year: Never true    Ran Out of Food in the Last Year: Never true  Transportation Needs: No Transportation Needs (08/18/2022)   PRAPARE - Administrator, Civil Service (Medical): No    Lack  of Transportation (Non-Medical): No  Physical Activity: Insufficiently Active (08/29/2020)   Exercise Vital Sign    Days of Exercise per Week: 3 days    Minutes of Exercise per Session: 20 min  Stress: No Stress Concern Present (08/29/2020)   [employer] of Occupational Health - Occupational Stress Questionnaire    Feeling of Stress : Not at all  Social Connections: Unknown (08/29/2020)   Social Connection and Isolation Panel [NHANES]    Frequency of Communication with Friends and Family: Not on file    Frequency of Social Gatherings with Friends and Family: Not on file    Attends Religious Services: Not on file    Active Member of Clubs or Organizations: Not on file  Attends Banker Meetings: Not on file    Marital Status: Married   Family History  Problem Relation Age of Onset   Stroke Mother    Heart disease Father        MI   Heart attack Father    Breast cancer Sister    Colon cancer Neg Hx    Prostate cancer Neg Hx    Scheduled Meds:  apixaban  2.5 mg Oral BID   Chlorhexidine Gluconate Cloth  6 each Topical Daily   cyanocobalamin  1,000 mcg Oral Daily   diltiazem  360 mg Oral Daily   feeding supplement  237 mL Oral BID BM   metoprolol succinate  100 mg Oral Daily   multivitamins with iron  1 tablet Oral Daily   pantoprazole  40 mg Oral Daily   polyethylene glycol  17 g Oral Daily   rosuvastatin  40 mg Oral Daily   senna  1 tablet Oral Daily   Continuous Infusions:  albumin human 12.5 g (09/05/22 1022)   ceFAZolin 2 g (09/05/22 0906)   PRN Meds:.acetaminophen, albuterol, dextromethorphan-guaiFENesin, melatonin, ondansetron (ZOFRAN) IV, simethicone, sodium chloride, traMADol Medications Prior to Admission:  Prior to Admission medications   Medication Sig Start Date End Date Taking? Authorizing Provider  amiodarone (PACERONE) 200 MG tablet Take 1 tablet (200 mg total) by mouth 2 (two) times daily. 08/09/22  Yes Delfino Lovett, MD  apixaban (ELIQUIS)  2.5 MG TABS tablet Take 1 tablet (2.5 mg total) by mouth 2 (two) times daily. 08/09/22  Yes Delfino Lovett, MD  aspirin 81 MG tablet Take 81 mg by mouth at bedtime.    Yes [provider]  ceFAZolin (ANCEF) IVPB Inject 2 g into the vein every 8 (eight) hours. Indication:  Vertebral osteomyelitis  First Dose: Yes Last Day of Therapy:  09/13/22  Labs - Once weekly (Monday):  CBC/D and CMP, Labs - Once weekly (Monday): ESR and CRP Fax weekly lab results  promptly to [id number] Method of administration: IV Push- Please pull PICC at completion of IV antibiotics  Method of administration may be changed at the discretion of home infusion pharmacist based upon assessment of the patient and/or caregiver's ability to self-administer the medication ordered. 08/09/22 09/15/22 Yes Delfino Lovett, MD  diltiazem (TIAZAC) 420 MG 24 hr capsule Take 420 mg by mouth daily.   Yes [provider]  hydrALAZINE (APRESOLINE) 25 MG tablet Take 25 mg by mouth every 6 (six) hours as needed (SBP >160).   Yes [provider]  Multiple Vitamins-Iron (MULTIVITAMINS WITH IRON) TABS Take 1 tablet by mouth daily.   Yes [provider]  naloxone (NARCAN) nasal spray 4 mg/0.1 mL Place 4 mg into the nose 3 (three) times daily as needed (opioid overdose).   Yes [provider]  pantoprazole (PROTONIX) 40 MG tablet Take 1 tablet (40 mg total) by mouth daily. 08/09/22  Yes Delfino Lovett, MD  polyethylene glycol (MIRALAX / GLYCOLAX) 17 g packet Take 17 g by mouth daily.   Yes [provider]  rosuvastatin (CRESTOR) 10 MG tablet Take 1 tablet (10 mg total) by mouth daily. 02/14/22  Yes Dale [place], MD  senna (SENOKOT) 8.6 MG TABS tablet Take 1 tablet by mouth daily.   Yes [provider]  sodium chloride (OCEAN) 0.65 % SOLN nasal spray Place 1 spray into both nostrils as needed for congestion. 08/09/22  Yes Delfino Lovett, MD  traMADol (ULTRAM) 50 MG tablet Take 50 mg by mouth  every 8  (eight) hours as needed for moderate pain.   Yes [provider]  traMADol HCl 100 MG TABS Take 100 mg by mouth every 12 (twelve) hours as needed (severe pain).   Yes [provider]  vitamin B-12 (CYANOCOBALAMIN) 1000 MCG tablet Take 1,000 mcg by mouth daily.   Yes [provider]  clopidogrel (PLAVIX) 75 MG tablet TAKE 1 TABLET BY MOUTH EVERY DAY WITH BREAKFAST 08/29/22   Dale [place], MD  diltiazem (CARDIZEM CD) 360 MG 24 hr capsule Take 1 capsule (360 mg total) by mouth daily. Patient not taking: Reported on 08/18/2022 08/09/22   Delfino Lovett, MD  feeding supplement (ENSURE ENLIVE / ENSURE PLUS) LIQD Take 237 mLs by mouth 2 (two) times daily between meals. 08/09/22   Delfino Lovett, MD  lisinopril (ZESTRIL) 40 MG tablet TAKE 1 TABLET BY MOUTH EVERY DAY 08/29/22   Dale [place], MD  metoprolol succinate (TOPROL-XL) 100 MG 24 hr tablet TAKE 1 TABLET BY MOUTH EVERY DAY WITH A MEAL 08/29/22   Dale [place], MD   No Known Allergies Review of Systems  Unable to perform ROS: Age    Physical Exam Vitals and nursing note reviewed.  Neurological:     Mental Status: He is alert.     Vital Signs: BP (!) 152/71 (BP Location: Left Arm)   Pulse 70   Temp 98.2 F (36.8 C)   Resp 18   Ht 6' (1.829 m)   Wt 88.1 kg   SpO2 (!) 88%   BMI 26.34 kg/m  Pain Scale: 0-10 POSS *See Group Information*: 1-Acceptable,Awake and alert Pain Score: 0-No pain   SpO2: SpO2: (!) 88 % O2 Device:SpO2: (!) 88 % O2 Flow Rate: .O2 Flow Rate (L/min): 2 L/min  IO: Intake/output summary:  Intake/Output Summary (Last 24 hours) at 09/05/2022 1208 Last data filed at 09/05/2022 1035 Gross per 24 hour  Intake 540 ml  Output 1700 ml  Net -1160 ml    LBM: Last BM Date : 09/04/22 Baseline Weight: Weight: 91.9 kg Most recent weight: Weight: 88.1 kg     Palliative Assessment/Data:     Time In: 0920 Time Out: 1015 Time Total: 55 minutes  Greater than 50%  of this time was spent counseling  and coordinating care related to the above assessment and plan.  Signed by: Katheran Awe, NP   Please contact Palliative Medicine Team phone at [phone] for questions and concerns.  For individual provider: See Loretha Stapler

## 2022-09-06 DIAGNOSIS — J9601 Acute respiratory failure with hypoxia: Secondary | ICD-10-CM | POA: Diagnosis not present

## 2022-09-06 DIAGNOSIS — Z515 Encounter for palliative care: Secondary | ICD-10-CM | POA: Diagnosis not present

## 2022-09-06 DIAGNOSIS — J189 Pneumonia, unspecified organism: Secondary | ICD-10-CM | POA: Diagnosis not present

## 2022-09-06 DIAGNOSIS — Z7189 Other specified counseling: Secondary | ICD-10-CM | POA: Diagnosis not present

## 2022-09-06 LAB — BASIC METABOLIC PANEL WITH GFR
Anion gap: 9 (ref 5–15)
BUN: 88 mg/dL — ABNORMAL HIGH (ref 8–23)
CO2: 25 mmol/L (ref 22–32)
Calcium: 8.6 mg/dL — ABNORMAL LOW (ref 8.9–10.3)
Chloride: 105 mmol/L (ref 98–111)
Creatinine, Ser: 3.1 mg/dL — ABNORMAL HIGH (ref 0.61–1.24)
GFR, Estimated: 19 mL/min — ABNORMAL LOW
Glucose, Bld: 138 mg/dL — ABNORMAL HIGH (ref 70–99)
Potassium: 3.5 mmol/L (ref 3.5–5.1)
Sodium: 139 mmol/L (ref 135–145)

## 2022-09-06 NOTE — Plan of Care (Signed)
  Problem: Activity: Goal: Capacity to carry out activities will improve Outcome: Progressing   Problem: Cardiac: Goal: Ability to achieve and maintain adequate cardiopulmonary perfusion will improve Outcome: Progressing   Problem: Activity: Goal: Ability to tolerate increased activity will improve Outcome: Progressing   Problem: Clinical Measurements: Goal: Ability to maintain a body temperature in the normal range will improve Outcome: Progressing

## 2022-09-06 NOTE — Progress Notes (Signed)
Physical Therapy Re-evaluation Patient Details Name: Brett Riley MRN: [id number] DOB: December 18, 1935 Today's Date: 09/06/2022   History of Present Illness Brett Riley is a 87 y/o M admitted with respiratory failure; other PMH includes MI, CHF, a-fib, HTN, anemia. Was at SNF doing rehab prior to admission; was previously admitted 5/18-6/7.    PT Comments  Pt alert, agreeable to PT, reported some back pain. Seen as PT/Ot co-treat to maximize pt participation, function, and safety. Overall mild improvement in seated balance compared to previous session. Still required maxAx2 for bed mobility and for lateral scoot to recliner but pt able to initiate all movements. Seated therex as well with verbal/tactile cues. The patient would benefit from further skilled PT intervention to continue to progress towards goals. Recommendation remains appropriate.       Assistance Recommended at Discharge Frequent or constant Supervision/Assistance  If plan is discharge home, recommend the following:  Can travel by private vehicle    Assistance with cooking/housework;Assist for transportation;Two people to help with walking and/or transfers;Two people to help with bathing/dressing/bathroom;Help with stairs or ramp for entrance   No  Equipment Recommendations  None recommended by PT    Recommendations for Other Services       Precautions / Restrictions Precautions Precautions: Fall Restrictions Weight Bearing Restrictions: No     Mobility  Bed Mobility Overal bed mobility: Needs Assistance Bed Mobility: Supine to Sit Rolling: Min guard   Supine to sit: Max assist, +2 for physical assistance Sit to supine: Max assist, +2 for physical assistance        Transfers Overall transfer level: Needs assistance   Transfers: Bed to chair/wheelchair/BSC            Lateral/Scoot Transfers: Max assist, +2 physical assistance      Ambulation/Gait                   Stairs              Wheelchair Mobility     Tilt Bed    Modified Rankin (Stroke Patients Only)       Balance Overall balance assessment: Needs assistance Sitting-balance support: Feet supported, Bilateral upper extremity supported, Single extremity supported Sitting balance-Leahy Scale: Poor Sitting balance - Comments: noted for continued L lateral posterior lean but improved from previous session                                    Cognition Arousal/Alertness: Awake/alert Behavior During Therapy: Flat affect Overall Cognitive Status: Within Functional Limits for tasks assessed                                 General Comments: HOH, increased time needed to follow commands.        Exercises Other Exercises Other Exercises: anterior leaning forward in chair x5 reps with BUE support Other Exercises: seated marching and LAQ bilaterally x5    General Comments        Pertinent Vitals/Pain Pain Assessment Pain Assessment: Faces Faces Pain Scale: Hurts little more Pain Descriptors / Indicators: Guarding, Discomfort, Grimacing Pain Intervention(s): Limited activity within patient's tolerance, Repositioned, Monitored during session    Home Living                          Prior Function  PT Goals (current goals can now be found in the care plan section) Progress towards PT goals: Progressing toward goals    Frequency    Min 2X/week      PT Plan Discharge plan needs to be updated    Co-evaluation PT/OT/SLP Co-Evaluation/Treatment: Yes Reason for Co-Treatment: Necessary to address cognition/behavior during functional activity;For patient/therapist safety;To address functional/ADL transfers PT goals addressed during session: Mobility/safety with mobility;Balance OT goals addressed during session: ADL's and self-care;Proper use of Adaptive equipment and DME      AM-PAC PT "6 Clicks" Mobility   Outcome Measure  Help needed  turning from your back to your side while in a flat bed without using bedrails?: A Lot Help needed moving from lying on your back to sitting on the side of a flat bed without using bedrails?: A Lot Help needed moving to and from a bed to a chair (including a wheelchair)?: A Lot Help needed standing up from a chair using your arms (e.g., wheelchair or bedside chair)?: Total Help needed to walk in hospital room?: Total Help needed climbing 3-5 steps with a railing? : Total 6 Click Score: 9    End of Session Equipment Utilized During Treatment: Gait belt;Oxygen Activity Tolerance: Patient tolerated treatment well Patient left: with call bell/phone within reach;with chair alarm set;in chair Nurse Communication: Mobility status PT Visit Diagnosis: Muscle weakness (generalized) (M62.81);Difficulty in walking, not elsewhere classified (R26.2);Unsteadiness on feet (R26.81);Other abnormalities of gait and mobility (R26.89)     Time: 1610-9604 PT Time Calculation (min) (ACUTE ONLY): 22 min  Charges:    $Therapeutic Activity: 8-22 mins PT General Charges $$ ACUTE PT VISIT: 1 Visit reval                    Olga Coaster PT, DPT 2:44 PM,09/06/22

## 2022-09-06 NOTE — Progress Notes (Signed)
Occupational Therapy Treatment Patient Details Name: Brett Riley MRN: [id number] DOB: 08-Apr-1935 Today's Date: 09/06/2022   History of present illness Brett Riley is a 87 y/o M admitted with respiratory failure; other PMH includes MI, CHF, a-fib, HTN, anemia. Was at SNF doing rehab prior to admission; was previously admitted 5/18-6/7.   OT comments  Pt seen for skilled co-tx with PT to address functional mobility tasks. Pt is pleasant and cooperative throughout. +2 for bed mobility with L lateral lean requiring mod A for static sitting balance. Max A of 2 to laterally scoot to the R into recliner chair. Pt washes face with set up A and O2 tubing changed as pt has recently had nose bled. Chair alarm activated and call bell within reach. Hoyer sling left under pt in order for nursing staff to assist pt back to bed when ready.    Recommendations for follow up therapy are one component of a multi-disciplinary discharge planning process, led by the attending physician.  Recommendations may be updated based on patient status, additional functional criteria and insurance authorization.    Assistance Recommended at Discharge Frequent or constant Supervision/Assistance  Patient can return home with the following  Two people to help with walking and/or transfers;A lot of help with bathing/dressing/bathroom;Assistance with cooking/housework;Direct supervision/assist for medications management;Direct supervision/assist for financial management;Assist for transportation;Help with stairs or ramp for entrance   Equipment Recommendations  Other (comment) (defer to next venue of care)       Precautions / Restrictions Precautions Precautions: Fall       Mobility Bed Mobility Overal bed mobility: Needs Assistance Bed Mobility: Supine to Sit     Supine to sit: Max assist, +2 for physical assistance          Transfers Overall transfer level: Needs assistance Equipment used: 2 person hand held  assist Transfers: Bed to chair/wheelchair/BSC            Lateral/Scoot Transfers: Max assist, +2 physical assistance       Balance Overall balance assessment: Needs assistance Sitting-balance support: Feet supported, Bilateral upper extremity supported, Single extremity supported Sitting balance-Leahy Scale: Poor                                     ADL either performed or assessed with clinical judgement    Extremity/Trunk Assessment Upper Extremity Assessment Upper Extremity Assessment: Generalized weakness   Lower Extremity Assessment Lower Extremity Assessment: Generalized weakness        Vision Baseline Vision/History: 1 Wears glasses Patient Visual Report: No change from baseline            Cognition Arousal/Alertness: Awake/alert Behavior During Therapy: Flat affect Overall Cognitive Status: Within Functional Limits for tasks assessed                                 General Comments: HOH, increased time needed to follow commands.                   Pertinent Vitals/ Pain       Pain Assessment Pain Assessment: Faces Faces Pain Scale: Hurts little more Pain Location: low back Pain Descriptors / Indicators: Guarding, Discomfort, Grimacing Pain Intervention(s): Limited activity within patient's tolerance, Repositioned         Frequency  Min 1X/week        Progress Toward Goals  OT Goals(current goals can now be found in the care plan section)  Progress towards OT goals: Progressing toward goals     Plan Frequency remains appropriate;Discharge plan needs to be updated    Co-evaluation    PT/OT/SLP Co-Evaluation/Treatment: Yes Reason for Co-Treatment: Necessary to address cognition/behavior during functional activity;For patient/therapist safety;To address functional/ADL transfers PT goals addressed during session: Mobility/safety with mobility;Balance OT goals addressed during session: ADL's and  self-care;Proper use of Adaptive equipment and DME      AM-PAC OT "6 Clicks" Daily Activity     Outcome Measure   Help from another person eating meals?: A Little Help from another person taking care of personal grooming?: A Little Help from another person toileting, which includes using toliet, bedpan, or urinal?: Total Help from another person bathing (including washing, rinsing, drying)?: Total Help from another person to put on and taking off regular upper body clothing?: A Lot Help from another person to put on and taking off regular lower body clothing?: Total 6 Click Score: 11    End of Session Equipment Utilized During Treatment: Rolling walker (2 wheels);Gait belt;Oxygen  OT Visit Diagnosis: Unsteadiness on feet (R26.81);Muscle weakness (generalized) (M62.81)   Activity Tolerance Patient tolerated treatment well   Patient Left with call bell/phone within reach;in chair;with chair alarm set   Nurse Communication Mobility status        Time: 1610-9604 OT Time Calculation (min): 20 min  Charges: OT General Charges $OT Visit: 1 Visit OT Treatments $Therapeutic Activity: 8-22 mins  Jackquline Denmark, MS, OTR/L , CBIS ascom [phone]  09/06/22, 2:34 PM

## 2022-09-06 NOTE — Progress Notes (Signed)
Progress Note   Patient: Brett Riley [name] DOB: 19-Jun-1935 DOA: 08/18/2022     19 DOS: the patient was seen and examined on 09/06/2022   Brief hospital course: 88 y.o. male with medical history significant of chronic dCHF, HTN, HLD, CAD, CKD-3a, anemia, chronic A fib on Eliquis, who presents 08/18/2022 to ED from rehab, chief complaint SOB.  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 Net IO Since Admission: -5,549.14 mL [08/20/22 1608]. Heparin stopped d/t rectal bleeding overnight.  06/19: euvolemic per cardio, d/c lasix, O2 requirement still high, CT chest reviewed, ?edema/atypical infection, given clinically worsening respiratory status have asked pulmonary and ID to consult. Have d/c amiodarone and ordered steroids in case amio toxicity.  06/20: ID and pulmonary assisting with the management.   6/22-6/25: patient feels clinically improved. Stable with oxygen via [place] and weaned down to 2-3L. Patient requiring judicial adjustments to balance between hypervolemia and renal injury with diuresis. Unable to pull fluid from lungs with thoracentesis so medical management is best option. His IV Abx to treat his vertebral osteomyelitis from previous admission continue unchanged and managed by ID.    6/28: Bed offered at [facility].  Authorization pending 7/1: No status changes.  Per patient's daughter he has been seeming more depressed.  I have offered outpatient referral to psychiatry.   7/2: Insurance authorization for LTAC continues to be pending   7/3 : Peer-to-peer  review done.  Not a candidate for LTAC.  Approved for skilled nursing facility patient   Assessment and Plan:  Principal Problem:   Acute respiratory failure with hypoxia (HCC) Active Problems:   Acute on chronic heart failure with preserved ejection fraction (HFpEF) (HCC)   MSSA bacteremia   Vertebral osteomyelitis (HCC)   CAD (coronary artery disease)   Myocardial injury   Hypertension   Hyperlipidemia   Chronic kidney disease, stage 3a (HCC)   Normocytic anemia   Atrial fibrillation, chronic (HCC)   Paroxysmal atrial fibrillation (HCC)   HCAP (healthcare-associated pneumonia)   Sepsis (HCC)   Pleural effusion   Amiodarone pulmonary toxicity   Acute on chronic HFrEF (heart failure with reduced ejection fraction) (HCC)   Pleural effusion on right   Status post thoracentesis   Heart failure with preserved ejection fraction (HCC)   Stage 3 chronic kidney disease (HCC)   Acute on chronic respiratory failure with hypoxia due to acute on chronic CHF  possible pneumonia, bilateral small pleural effusion- as seen on chest CT Not on oxygen supplementation at baseline Currently on 3L with O2 saturation in upper 90s. - wean as tolerated -Completed course of azithromycin. - Continue Ancef as written for his osteomyelitis.  Last dose 7/12 - incentive spirometer q1hr - Off steroids -Medically stable for discharge.    Repeat CT scan of the chest 07/06 shows 1. Interval improvement of bilateral lung opacities with mild residual ground-glass and scattered consolidations, likely improving multifocal pneumonia. 2. Small bilateral pleural effusions, decreased in size when compared with the prior exam. 3. Decreased size of mediastinal lymph nodes, consistent with resolving reactive adenopathy. 4. Coronary artery calcifications and aortic Atherosclerosis (  ICD10-I70.0). 5. Unchanged contour abnormality of the distal ascending thoracic aorta, possibly a chronic penetrating  atherosclerotic ulcer or aneurysm. Chest CTA could be performed for further characterization.     Acute on chronic diastolic CHF- Markedly net negative.  Appears euvolemic Plan: Lasix on hold due to worsening renal function TED hose Strict I's and O's, daily weights   Pleural effusion- IR unable to perform thoracentesis 6/20, 6/21. Medical management. Clinically improving respiratory status.      Atrial fibrillation, chronic (HCC): Heart rate now well controlled in 60-70s diltiazem, metoprolol per cardiology Lisinopril held with AKI Restarted eliquis (renally dosed) in setting of no new bleeding and patient's hgb has been stable >5 days after concern for GI bleed early in admission on heparin gtt.   Recent hx of MSSA bacteremia vertebral osteomyelitis (HCC): continue IV Ancef until 7/12. Follow up with ID outpatient 7/11 repeat Blood culture NGTD   CAD  Myocardial injury  HLD:  trop  258, possibly due to demand ischemia secondary to CHF exacerbation.  Patient does not have chest pain. Crestor, beta blocker, Aspirin 81 discontinued as he's on eliquis  Cardiology following  May need coronary angiography at some point but will defer for now     AKI on Chronic kidney disease, stage 3a (HCC):  Baseline creatinine 1.5-1.9.  Uptrending creatinine level Hold furosemide Administer albumin 12.5 g daily Follow BMP Nephrology following     Normocytic anemia: hgb stable around 8.5-9. S/p 1 unit PRBC 06/18 ABLA w/ rectal bleed   Hypertension Continue to closely monitor vital signs.   Resolved hypokalemia   GERD PPI    Right knee pain -X-ray with chronic arthritic changes.  Recommend frequent mobility     Hypokalemia Secondary to diuretic therapy.  Supplemented            Subjective: Patient is seen and examined at the bedside  Physical Exam: Vitals:   09/05/22 2357 09/06/22 0510 09/06/22 0730 09/06/22 1234  BP: (!) 152/75  (!) 150/68 124/60  Pulse: 73  70 64   Resp: 18  20 15   Temp: 98.1 F (36.7 C)  97.6 F (36.4 C) 97.9 F (36.6 C)  TempSrc:      SpO2: 97%  99% 98%  Weight:  86.2 kg    Height:       General exam: NAD.  Lying in bed.  Sleeping but arouses easily Respiratory system: Right-sided crackles.  Normal work of breathing.  3 L Cardiovascular system: S1-S2, RRR, no murmurs, no pedal edema Gastrointestinal system: Soft, NT/ND, normal bowel sounds Central nervous system: Alert and oriented. No focal neurological deficits. Extremities: Right lower extremity decreased ROM Skin: No rashes, lesions or ulcers Psychiatry: Judgement and insight appear normal. Mood & affect flattened.    Data Reviewed: Labs reviewed.  Creatinine 3.10, BUN 88 There are no new results to review at this time.  Family Communication: Plan of care discussed with patient's wife at the bedside.  All questions and concerns have been addressed.  Disposition: Status is: Inpatient Remains inpatient appropriate because: Awaiting nursing home placement  Planned Discharge Destination: Skilled nursing facility    Time spent: 35 minutes  Author: Lucile Shutters, MD 09/06/2022 1:41 PM  For on call review [email or web address].

## 2022-09-06 NOTE — Progress Notes (Signed)
[facility]  ROUNDING NOTE   Subjective:   Patient seen laying in bed Alert Reports appetite remains poor Lower extremity edema improved, TEDs in place   Creatinine 3.10 UOP 2.5L  Objective:  Vital signs in last 24 hours:  Temp:  [97.6 F (36.4 C)-98.3 F (36.8 C)] 97.9 F (36.6 C) (07/05 1234) Pulse Rate:  [63-73] 64 (07/05 1234) Resp:  [15-20] 15 (07/05 1234) BP: (124-154)/(60-75) 124/60 (07/05 1234) SpO2:  [97 %-100 %] 98 % (07/05 1234) Weight:  [86.2 kg] 86.2 kg (07/05 0510)  Weight change: -1.9 kg Filed Weights   09/03/22 0516 09/05/22 0356 09/06/22 0510  Weight: 88 kg 88.1 kg 86.2 kg    Intake/Output: I/O last 3 completed shifts: In: 1768.8 [P.O.:1020; IV Piggyback:748.8] Out: 3600 [Urine:3600]   Intake/Output this shift:  Total I/O In: 50 [IV Piggyback:50] Out: 450 [Urine:450]  Physical Exam: General: NAD, laying in bed  Head: Normocephalic, atraumatic. Moist oral mucosal membranes  Eyes: Anicteric  Lungs:  Diminished, HFNC  Heart: Regular rate and rhythm  Abdomen:  Soft, nontender  Extremities:  ++ peripheral edema.  Neurologic: Alert and oriented, moving all four extremities  Skin: No lesions  Access: none    Basic Metabolic Panel: Recent Labs  Lab 09/01/22 0500 09/02/22 0930 09/03/22 1000 09/05/22 0355 09/06/22 1005  NA 134* 134* 137 135 139  K 3.7 3.5 3.1* 3.0* 3.5  CL 99 99 101 101 105  CO2 25 23 26 23 25   GLUCOSE 132* 120* 152* 98 138*  BUN 93* 100* 98* 95* 88*  CREATININE 2.65* 2.97* 2.83* 3.09* 3.10*  CALCIUM 8.1* 8.2* 8.3* 8.2* 8.6*     Liver Function Tests: Recent Labs  Lab 09/02/22 1800  ALBUMIN 2.2*    No results for input(s): "LIPASE", "AMYLASE" in the last 168 hours. No results for input(s): "AMMONIA" in the last 168 hours.  CBC: Recent Labs  Lab 09/02/22 0930  WBC 8.0  NEUTROABS 6.7  HGB 8.9*  HCT 27.4*  MCV 91.9  PLT 236     Cardiac Enzymes: No results for input(s): "CKTOTAL", "CKMB",  "CKMBINDEX", "TROPONINI" in the last 168 hours.   BNP: Invalid input(s): "POCBNP"  CBG: Recent Labs  Lab 09/02/22 0827  GLUCAP 92     Microbiology: Results for orders placed or performed during the hospital encounter of 08/18/22  Blood culture (routine x 2)     Status: None   Collection Time: 08/18/22 10:45 AM   Specimen: BLOOD LEFT ARM  Result Value Ref Range Status   Specimen Description BLOOD LEFT ARM  Final   Special Requests   Final    BOTTLES DRAWN AEROBIC AND ANAEROBIC Blood Culture adequate volume   Culture   Final    NO GROWTH 5 DAYS Performed at [facility], [address] [place], [place] [address]    Report Status 08/23/2022 FINAL  Final  Resp Panel by RT-PCR (Flu A&B, Covid) Anterior Nasal Swab     Status: None   Collection Time: 08/18/22 10:46 AM   Specimen: Anterior Nasal Swab  Result Value Ref Range Status   SARS Coronavirus 2 by RT PCR NEGATIVE NEGATIVE Final    Comment: (NOTE) SARS-CoV-2 target nucleic acids are NOT DETECTED.  The SARS-CoV-2 RNA is generally detectable in upper respiratory specimens during the acute phase of infection. The lowest concentration of SARS-CoV-2 viral copies this assay can detect is 138 copies/mL. A negative result does not preclude SARS-Cov-2 infection and should not be used as the  sole basis for treatment or other patient management decisions. A negative result may occur with  improper specimen collection/handling, submission of specimen other than nasopharyngeal swab, presence of viral mutation(s) within the areas targeted by this assay, and inadequate number of viral copies(<138 copies/mL). A negative result must be combined with clinical observations, patient history, and epidemiological information. The expected result is Negative.  Fact Sheet for Patients:  BloggerCourse.com  Fact Sheet for Healthcare Providers:  SeriousBroker.it  This test is no t  yet approved or cleared by the [place] FDA and  has been authorized for detection and/or diagnosis of SARS-CoV-2 by FDA under an Emergency Use Authorization (EUA). This EUA will remain  in effect (meaning this test can be used) for the duration of the COVID-19 declaration under Section 564(b)(1) of the Act, 21 U.S.C.section 360bbb-3(b)(1), unless the authorization is terminated  or revoked sooner.       Influenza A by PCR NEGATIVE NEGATIVE Final   Influenza B by PCR NEGATIVE NEGATIVE Final    Comment: (NOTE) The Xpert Xpress SARS-CoV-2/FLU/RSV plus assay is intended as an aid in the diagnosis of influenza from Nasopharyngeal swab specimens and should not be used as a sole basis for treatment. Nasal washings and aspirates are unacceptable for Xpert Xpress SARS-CoV-2/FLU/RSV testing.  Fact Sheet for Patients: BloggerCourse.com  Fact Sheet for Healthcare Providers: SeriousBroker.it  This test is not yet approved or cleared by the [place] FDA and has been authorized for detection and/or diagnosis of SARS-CoV-2 by FDA under an Emergency Use Authorization (EUA). This EUA will remain in effect (meaning this test can be used) for the duration of the COVID-19 declaration under Section 564(b)(1) of the Act, 21 U.S.C. section 360bbb-3(b)(1), unless the authorization is terminated or revoked.  Performed at [facility], [address] [place], [place] [address]   Culture, blood (Routine X 2) w Reflex to ID Panel     Status: None   Collection Time: 08/18/22  9:19 PM   Specimen: BLOOD LEFT ARM  Result Value Ref Range Status   Specimen Description BLOOD LEFT ARM  Final   Special Requests   Final    BOTTLES DRAWN AEROBIC AND ANAEROBIC Blood Culture adequate volume   Culture   Final    NO GROWTH 5 DAYS Performed at [facility], [address] [place], [place] [address]    Report Status 08/23/2022 FINAL   Final  Respiratory (~20 pathogens) panel by PCR     Status: None   Collection Time: 08/21/22  1:50 PM   Specimen: Nasopharyngeal Swab; Respiratory  Result Value Ref Range Status   Adenovirus NOT DETECTED NOT DETECTED Final   Coronavirus 229E NOT DETECTED NOT DETECTED Final    Comment: (NOTE) The Coronavirus on the Respiratory Panel, DOES NOT test for the novel  Coronavirus (2019 nCoV)    Coronavirus HKU1 NOT DETECTED NOT DETECTED Final   Coronavirus NL63 NOT DETECTED NOT DETECTED Final   Coronavirus OC43 NOT DETECTED NOT DETECTED Final   Metapneumovirus NOT DETECTED NOT DETECTED Final   Rhinovirus / Enterovirus NOT DETECTED NOT DETECTED Final   Influenza A NOT DETECTED NOT DETECTED Final   Influenza B NOT DETECTED NOT DETECTED Final   Parainfluenza Virus 1 NOT DETECTED NOT DETECTED Final   Parainfluenza Virus 2 NOT DETECTED NOT DETECTED Final   Parainfluenza Virus 3 NOT DETECTED NOT DETECTED Final   Parainfluenza Virus 4 NOT DETECTED NOT DETECTED Final   Respiratory Syncytial Virus NOT DETECTED NOT DETECTED Final  Bordetella pertussis NOT DETECTED NOT DETECTED Final   Bordetella Parapertussis NOT DETECTED NOT DETECTED Final   Chlamydophila pneumoniae NOT DETECTED NOT DETECTED Final   Mycoplasma pneumoniae NOT DETECTED NOT DETECTED Final    Comment: Performed at [facility], [address], [place], [place] [address]  Culture, blood (Routine X 2) w Reflex to ID Panel     Status: None   Collection Time: 08/21/22  8:58 PM   Specimen: BLOOD  Result Value Ref Range Status   Specimen Description BLOOD LEFT WRIST  Final   Special Requests   Final    BOTTLES DRAWN AEROBIC AND ANAEROBIC Blood Culture adequate volume   Culture   Final    NO GROWTH 5 DAYS Performed at [facility], [address] [place], [place] [address]    Report Status 08/26/2022 FINAL  Final  Culture, blood (Routine X 2) w Reflex to ID Panel     Status: None   Collection Time: 08/21/22 11:01 PM    Specimen: BLOOD  Result Value Ref Range Status   Specimen Description BLOOD BLOOD LEFT ARM  Final   Special Requests   Final    BOTTLES DRAWN AEROBIC AND ANAEROBIC Blood Culture adequate volume   Culture   Final    NO GROWTH 5 DAYS Performed at [facility], [address] [place], [place] [address]    Report Status 08/26/2022 FINAL  Final  Expectorated Sputum Assessment w Gram Stain, Rflx to Resp Cult     Status: None   Collection Time: 08/22/22 11:22 AM   Specimen: Sputum  Result Value Ref Range Status   Specimen Description SPUTUM  Final   Special Requests EXPSU  Final   Sputum evaluation   Final    THIS SPECIMEN IS ACCEPTABLE FOR SPUTUM CULTURE Performed at [facility], [address] [place], [place] [address]    Report Status 08/22/2022 FINAL  Final  Culture, Respiratory w Gram Stain     Status: None   Collection Time: 08/22/22 11:22 AM   Specimen: SPU  Result Value Ref Range Status   Specimen Description   Final    SPUTUM Performed at [facility], [address] [place], [place] [address]    Special Requests   Final    EXPSU Reflexed from [id number] Performed at [facility], [address] [place], [place] [address]    Gram Stain   Final    ABUNDANT SQUAMOUS EPITHELIAL CELLS PRESENT FEW WBC PRESENT, PREDOMINANTLY PMN ABUNDANT GRAM VARIABLE ROD ABUNDANT GRAM POSITIVE COCCI IN PAIRS    Culture   Final    Normal respiratory flora-no Staph aureus or Pseudomonas seen Performed at [facility], [address], [place], [place] [address]    Report Status 08/25/2022 FINAL  Final    Coagulation Studies: No results for input(s): "LABPROT", "INR" in the last 72 hours.  Urinalysis: No results for input(s): "COLORURINE", "LABSPEC", "PHURINE", "GLUCOSEU", "HGBUR", "BILIRUBINUR", "KETONESUR", "PROTEINUR", "UROBILINOGEN", "NITRITE", "LEUKOCYTESUR" in the last 72 hours.  Invalid input(s): "APPERANCEUR"     Imaging: CT  CHEST WO CONTRAST  Result Date: 09/05/2022 CLINICAL DATA:  Pneumonia EXAM: CT CHEST WITHOUT CONTRAST TECHNIQUE: Multidetector CT imaging of the chest was performed following the standard protocol without IV contrast. RADIATION DOSE REDUCTION: This exam was performed according to the departmental dose-optimization program which includes automated exposure control, adjustment of the mA and/or kV according to patient size and/or use of iterative reconstruction technique. COMPARISON:  Chest  CT dated August 21, 2022; lumbar spine MRI dated Aug 02, 2022 FINDINGS: Cardiovascular: Normal heart size. No pericardial effusion. Moderate calcified plaque of the ascending thoracic aorta. Redemonstrated contour abnormality of the distal thoracic aorta, unchanged when compared with the prior exam severe coronary artery calcifications. Mediastinum/Nodes: Esophagus and thyroid are unremarkable. Decreased size of mediastinal lymph nodes. Reference AP window lymph node measuring 7 mm in short axis on series 2, image 56, previously 9 mm. Lungs/Pleura: Central airways are patent. Interval improvement of bilateral lung opacities with mild residual ground-glass and scattered consolidations. Small bilateral pleural effusions, decreased in size when compared with the prior exam. Upper Abdomen: Partially visualized aortic stent graft. Posterior diverticulum of the stomach. No acute abnormality. Musculoskeletal: Focal endplate irregularity of L1-L2 which correlates with discitis osteomyelitis described on prior lumbar spine MRI. No chest wall mass or suspicious bone lesions identified. IMPRESSION: 1. Interval improvement of bilateral lung opacities with mild residual ground-glass and scattered consolidations, likely improving multifocal pneumonia. 2. Small bilateral pleural effusions, decreased in size when compared with the prior exam. 3. Decreased size of mediastinal lymph nodes, consistent with resolving reactive adenopathy. 4. Coronary  artery calcifications and aortic Atherosclerosis (ICD10-I70.0). 5. Unchanged contour abnormality of the distal ascending thoracic aorta, possibly a chronic penetrating atherosclerotic ulcer or aneurysm. Chest CTA could be performed for further characterization. Electronically Signed   By: Allegra Lai M.D.   On: 09/05/2022 14:23   DG Chest Port 1 View  Result Date: 09/04/2022 CLINICAL DATA:  Congestive heart failure EXAM: PORTABLE CHEST 1 VIEW COMPARISON:  X-ray 08/25/2022 FINDINGS: Stable right-sided PICC. There persistent patchy bilateral areas of opacity more in the left lung than right. These slightly increasing in the left lung. Increasing area right midlung. No pneumothorax or effusion. Stable enlarged cardiopericardial silhouette with a calcified aorta. Persistent dilated air-filled loops of bowel beneath the left hemidiaphragm. IMPRESSION: Slight increase in patchy left lung opacities left-greater-than-right. Recommend continued follow-up Electronically Signed   By: Karen Kays M.D.   On: 09/04/2022 16:26     Medications:    albumin human Stopped (09/06/22 1000)   ceFAZolin 2 g (09/06/22 1006)    apixaban  2.5 mg Oral BID   Chlorhexidine Gluconate Cloth  6 each Topical Daily   cyanocobalamin  1,000 mcg Oral Daily   diltiazem  360 mg Oral Daily   feeding supplement  237 mL Oral BID BM   metoprolol succinate  100 mg Oral Daily   multivitamins with iron  1 tablet Oral Daily   pantoprazole  40 mg Oral Daily   polyethylene glycol  17 g Oral Daily   rosuvastatin  40 mg Oral Daily   senna  1 tablet Oral Daily   acetaminophen, albuterol, dextromethorphan-guaiFENesin, melatonin, ondansetron (ZOFRAN) IV, simethicone, sodium chloride, traMADol  Assessment/ Plan:  Brett Riley is a 87 y.o.  male with recent prolonged admission for aortic ulcer s/p endovascular repair 07/24/22, MSSA bacteremia with L spine osteomyelitis and discitis, chronic diastolic heart failure, hypertension,  hyperlipidemia, coronary artery disease, atrial fibrillation who was admitted to [facility] on 08/18/2022 for HCAP (healthcare-associated pneumonia) [J18.9] Acute on chronic respiratory failure with hypoxia (HCC) [J96.21] Sepsis, due to unspecified organism, unspecified whether acute organ dysfunction present (HCC) [A41.9]     1.  Acute kidney injury with proteinuria and hematuria: Baseline creatinine of 1.15 with normal GFR on 5/29. ANA positive but double-stranded DNA, anti-GBM, c-ANCA p-ANCA, MPO, PR-3, ENA negative.  Complements normal.   Differential diagnosis of AKI is  extensive. Patient had pleural effusion/atypical infection/pulmonary edema noted on CT on June 19.  Extensive atherosclerosis and coronary atherosclerosis was noted. No IV contrast exposure recently. Blood pressure has been in the 140s to 150s.  Baseline creatinine of 0.98 from 07/29/2022. It has subsequently continued to increase and is up to 2.97 today. 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 Renal ultrasound on 08/23/2022 negative for obstruction.  AKI likely secondary to ATN.  Other differential includes acute interstitial nephritis. No acute indication for dialysis.  - holding lisinopril -Good UOP noted -Receiving albumin  -Obtain renal artery duplex   2.  Acute on chronic diastolic heart failure. With LE edema  Ejection fraction 55 to 60% with grade 1 diastolic dysfunction noted.  - appreciate cardiology input.   3.  Anemia with renal failure: PRBC transfusion on 6/18.   Lab Results  Component Value Date   HGB 8.9 (L) 09/02/2022       LOS: 19   7/5/202412:35 PM

## 2022-09-06 NOTE — Progress Notes (Signed)
Palliative: Brett Riley is sitting up in the [place] chair in his room.  He appears chronically ill and somewhat frail.  He greets me, making and somewhat keeping eye contact.  He is alert and oriented, able to make his needs known.  His wife is present at bedside.    We review his CT results from yesterday which overall look promising.  I encouraged Brett Riley to continue to do his part for rehab while hospitalized.    Mrs. Wanat shares her concerns about going to short-term rehab.  She states that she would like information sent to local rehabs, even those out of network.  She shares that she would like to be able to send him to a rehab that has good reviews.  The goal remains for continue to treat but no CPR or intubation.  Time for outcomes.  Anticipate need for further rehab, possibly lengthy rehab.  Conference with attending, bedside nursing staff, transition of care team related to patient condition, needs, goals of care, disposition.  Plan: Continue to treat the treatable but no CPR or intubation.  Time for outcomes.  Anticipate need for lengthy rehab.  50 minutes  Lillia Carmel, NP Palliative medicine team Team phone [phone] Greater than 50% of this time was spent counseling and coordinating care related to the above assessment and plan.

## 2022-09-06 NOTE — TOC Progression Note (Signed)
Transition of Care [facility]) - Progression Note    Patient Details  Name: Brett Riley MRN: [id number] Date of Birth: [birth year]  Transition of Care [facility]) CM/SW Contact  Truddie Hidden, RN Phone Number: 09/06/2022, 4:28 PM  Clinical Narrative:    Attempt to reach [place] in admission at [employer]. No answer. Left a message requesting update on decision.    Expected Discharge Plan: Skilled Nursing Facility Barriers to Discharge: Continued Medical Work up, English as a second language teacher  Expected Discharge Plan and Services     Post Acute Care Choice: Skilled Nursing Facility Living arrangements for the past 2 months: Single Family Home                                       Social Determinants of Health (SDOH) Interventions SDOH Screenings   Food Insecurity: No Food Insecurity (08/18/2022)  Housing: Low Risk  (08/18/2022)  Transportation Needs: No Transportation Needs (08/18/2022)  Utilities: Not At Risk (08/18/2022)  Depression (PHQ2-9): Low Risk  (06/06/2022)  Financial Resource Strain: Low Risk  (08/29/2020)  Physical Activity: Insufficiently Active (08/29/2020)  Social Connections: Unknown (08/29/2020)  Stress: No Stress Concern Present (08/29/2020)  Tobacco Use: Low Risk  (09/05/2022)    Readmission Risk Interventions     No data to display

## 2022-09-07 ENCOUNTER — Inpatient Hospital Stay: Payer: Medicare HMO

## 2022-09-07 DIAGNOSIS — J9601 Acute respiratory failure with hypoxia: Secondary | ICD-10-CM | POA: Diagnosis not present

## 2022-09-07 LAB — BASIC METABOLIC PANEL WITH GFR
Anion gap: 11 (ref 5–15)
BUN: 85 mg/dL — ABNORMAL HIGH (ref 8–23)
CO2: 24 mmol/L (ref 22–32)
Calcium: 8.6 mg/dL — ABNORMAL LOW (ref 8.9–10.3)
Chloride: 105 mmol/L (ref 98–111)
Creatinine, Ser: 3.22 mg/dL — ABNORMAL HIGH (ref 0.61–1.24)
GFR, Estimated: 18 mL/min — ABNORMAL LOW
Glucose, Bld: 90 mg/dL (ref 70–99)
Potassium: 3.4 mmol/L — ABNORMAL LOW (ref 3.5–5.1)
Sodium: 140 mmol/L (ref 135–145)

## 2022-09-07 LAB — CBC
HCT: 25.9 % — ABNORMAL LOW (ref 39.0–52.0)
Hemoglobin: 8.3 g/dL — ABNORMAL LOW (ref 13.0–17.0)
MCH: 29.5 pg (ref 26.0–34.0)
MCHC: 32 g/dL (ref 30.0–36.0)
MCV: 92.2 fL (ref 80.0–100.0)
Platelets: 148 10*3/uL — ABNORMAL LOW (ref 150–400)
RBC: 2.81 MIL/uL — ABNORMAL LOW (ref 4.22–5.81)
RDW: 14.8 % (ref 11.5–15.5)
WBC: 6.6 10*3/uL (ref 4.0–10.5)
nRBC: 0 % (ref 0.0–0.2)

## 2022-09-07 MED ORDER — POTASSIUM CHLORIDE CRYS ER 20 MEQ PO TBCR
40.0000 meq | EXTENDED_RELEASE_TABLET | Freq: Once | ORAL | Status: AC
  Administered 2022-09-07: 40 meq via ORAL
  Filled 2022-09-07: qty 2

## 2022-09-07 MED ORDER — SENNA 8.6 MG PO TABS
1.0000 | ORAL_TABLET | Freq: Every day | ORAL | Status: DC
  Administered 2022-09-09 – 2022-09-16 (×7): 8.6 mg via ORAL
  Filled 2022-09-07 (×9): qty 1

## 2022-09-07 MED ORDER — ROSUVASTATIN CALCIUM 10 MG PO TABS
10.0000 mg | ORAL_TABLET | Freq: Every day | ORAL | Status: DC
  Administered 2022-09-08 – 2022-09-25 (×18): 10 mg via ORAL
  Filled 2022-09-07 (×18): qty 1

## 2022-09-07 MED ORDER — POLYETHYLENE GLYCOL 3350 17 G PO PACK
17.0000 g | PACK | Freq: Every day | ORAL | Status: DC
  Administered 2022-09-10 – 2022-09-24 (×10): 17 g via ORAL
  Filled 2022-09-07 (×15): qty 1

## 2022-09-07 NOTE — Progress Notes (Signed)
[facility]  ROUNDING NOTE   Subjective:   Patient resting in bed No family present  2L [place]  TED hose in place   UOP 1.75L  Objective:  Vital signs in last 24 hours:  Temp:  [97.9 F (36.6 C)-98.3 F (36.8 C)] 98 F (36.7 C) (07/06 0444) Pulse Rate:  [60-66] 66 (07/06 0444) Resp:  [15-20] 20 (07/06 0444) BP: (124-151)/(60-77) 151/77 (07/06 0444) SpO2:  [95 %-98 %] 95 % (07/06 0444) Weight:  [88.7 kg] 88.7 kg (07/06 0443)  Weight change: 2.5 kg Filed Weights   09/05/22 0356 09/06/22 0510 09/07/22 0443  Weight: 88.1 kg 86.2 kg 88.7 kg    Intake/Output: I/O last 3 completed shifts: In: 3719.9 [P.O.:240; IV Piggyback:3479.9] Out: 3050 [Urine:3050]   Intake/Output this shift:  Total I/O In: -  Out: 700 [Urine:700]  Physical Exam: General: NAD, laying in bed  Head: Normocephalic, atraumatic. Moist oral mucosal membranes  Eyes: Anicteric  Lungs:  Diminished, [place] O2  Heart: Regular rate and rhythm  Abdomen:  Soft, nontender  Extremities:  + peripheral edema.  Neurologic: Alert and oriented, moving all four extremities  Skin: No lesions  Access: none    Basic Metabolic Panel: Recent Labs  Lab 09/01/22 0500 09/02/22 0930 09/03/22 1000 09/05/22 0355 09/06/22 1005  NA 134* 134* 137 135 139  K 3.7 3.5 3.1* 3.0* 3.5  CL 99 99 101 101 105  CO2 25 23 26 23 25   GLUCOSE 132* 120* 152* 98 138*  BUN 93* 100* 98* 95* 88*  CREATININE 2.65* 2.97* 2.83* 3.09* 3.10*  CALCIUM 8.1* 8.2* 8.3* 8.2* 8.6*     Liver Function Tests: Recent Labs  Lab 09/02/22 1800  ALBUMIN 2.2*    No results for input(s): "LIPASE", "AMYLASE" in the last 168 hours. No results for input(s): "AMMONIA" in the last 168 hours.  CBC: Recent Labs  Lab 09/02/22 0930  WBC 8.0  NEUTROABS 6.7  HGB 8.9*  HCT 27.4*  MCV 91.9  PLT 236     Cardiac Enzymes: No results for input(s): "CKTOTAL", "CKMB", "CKMBINDEX", "TROPONINI" in the last 168 hours.   BNP: Invalid  input(s): "POCBNP"  CBG: Recent Labs  Lab 09/02/22 0827  GLUCAP 92     Microbiology: Results for orders placed or performed during the hospital encounter of 08/18/22  Blood culture (routine x 2)     Status: None   Collection Time: 08/18/22 10:45 AM   Specimen: BLOOD LEFT ARM  Result Value Ref Range Status   Specimen Description BLOOD LEFT ARM  Final   Special Requests   Final    BOTTLES DRAWN AEROBIC AND ANAEROBIC Blood Culture adequate volume   Culture   Final    NO GROWTH 5 DAYS Performed at [facility], [address], [place], [place] [address]    Report Status 08/23/2022 FINAL  Final  Resp Panel by RT-PCR (Flu A&B, Covid) Anterior Nasal Swab     Status: None   Collection Time: 08/18/22 10:46 AM   Specimen: Anterior Nasal Swab  Result Value Ref Range Status   SARS Coronavirus 2 by RT PCR NEGATIVE NEGATIVE Final    Comment: (NOTE) SARS-CoV-2 target nucleic acids are NOT DETECTED.  The SARS-CoV-2 RNA is generally detectable in upper respiratory specimens during the acute phase of infection. The lowest concentration of SARS-CoV-2 viral copies this assay can detect is 138 copies/mL. A negative result does not preclude SARS-Cov-2 infection and should not be used as the sole basis for treatment  or other patient management decisions. A negative result may occur with  improper specimen collection/handling, submission of specimen other than nasopharyngeal swab, presence of viral mutation(s) within the areas targeted by this assay, and inadequate number of viral copies(<138 copies/mL). A negative result must be combined with clinical observations, patient history, and epidemiological information. The expected result is Negative.  Fact Sheet for Patients:  BloggerCourse.com  Fact Sheet for Healthcare Providers:  SeriousBroker.it  This test is no t yet approved or cleared by the [place] FDA and  has been  authorized for detection and/or diagnosis of SARS-CoV-2 by FDA under an Emergency Use Authorization (EUA). This EUA will remain  in effect (meaning this test can be used) for the duration of the COVID-19 declaration under Section 564(b)(1) of the Act, 21 U.S.C.section 360bbb-3(b)(1), unless the authorization is terminated  or revoked sooner.       Influenza A by PCR NEGATIVE NEGATIVE Final   Influenza B by PCR NEGATIVE NEGATIVE Final    Comment: (NOTE) The Xpert Xpress SARS-CoV-2/FLU/RSV plus assay is intended as an aid in the diagnosis of influenza from Nasopharyngeal swab specimens and should not be used as a sole basis for treatment. Nasal washings and aspirates are unacceptable for Xpert Xpress SARS-CoV-2/FLU/RSV testing.  Fact Sheet for Patients: BloggerCourse.com  Fact Sheet for Healthcare Providers: SeriousBroker.it  This test is not yet approved or cleared by the [place] FDA and has been authorized for detection and/or diagnosis of SARS-CoV-2 by FDA under an Emergency Use Authorization (EUA). This EUA will remain in effect (meaning this test can be used) for the duration of the COVID-19 declaration under Section 564(b)(1) of the Act, 21 U.S.C. section 360bbb-3(b)(1), unless the authorization is terminated or revoked.  Performed at [facility] [place], [address] [place], [place] [address]   Culture, blood (Routine X 2) w Reflex to ID Panel     Status: None   Collection Time: 08/18/22  9:19 PM   Specimen: BLOOD LEFT ARM  Result Value Ref Range Status   Specimen Description BLOOD LEFT ARM  Final   Special Requests   Final    BOTTLES DRAWN AEROBIC AND ANAEROBIC Blood Culture adequate volume   Culture   Final    NO GROWTH 5 DAYS Performed at [facility], [address] [place], [place] [address]    Report Status 08/23/2022 FINAL  Final  Respiratory (~20 pathogens) panel by PCR     Status:  None   Collection Time: 08/21/22  1:50 PM   Specimen: Nasopharyngeal Swab; Respiratory  Result Value Ref Range Status   Adenovirus NOT DETECTED NOT DETECTED Final   Coronavirus 229E NOT DETECTED NOT DETECTED Final    Comment: (NOTE) The Coronavirus on the Respiratory Panel, DOES NOT test for the novel  Coronavirus (2019 nCoV)    Coronavirus HKU1 NOT DETECTED NOT DETECTED Final   Coronavirus NL63 NOT DETECTED NOT DETECTED Final   Coronavirus OC43 NOT DETECTED NOT DETECTED Final   Metapneumovirus NOT DETECTED NOT DETECTED Final   Rhinovirus / Enterovirus NOT DETECTED NOT DETECTED Final   Influenza A NOT DETECTED NOT DETECTED Final   Influenza B NOT DETECTED NOT DETECTED Final   Parainfluenza Virus 1 NOT DETECTED NOT DETECTED Final   Parainfluenza Virus 2 NOT DETECTED NOT DETECTED Final   Parainfluenza Virus 3 NOT DETECTED NOT DETECTED Final   Parainfluenza Virus 4 NOT DETECTED NOT DETECTED Final   Respiratory Syncytial Virus NOT DETECTED NOT DETECTED Final   Bordetella pertussis  NOT DETECTED NOT DETECTED Final   Bordetella Parapertussis NOT DETECTED NOT DETECTED Final   Chlamydophila pneumoniae NOT DETECTED NOT DETECTED Final   Mycoplasma pneumoniae NOT DETECTED NOT DETECTED Final    Comment: Performed at [facility], [address] [place], [place] [address]  Culture, blood (Routine X 2) w Reflex to ID Panel     Status: None   Collection Time: 08/21/22  8:58 PM   Specimen: BLOOD  Result Value Ref Range Status   Specimen Description BLOOD LEFT WRIST  Final   Special Requests   Final    BOTTLES DRAWN AEROBIC AND ANAEROBIC Blood Culture adequate volume   Culture   Final    NO GROWTH 5 DAYS Performed at [facility], [address] [place], [place] [address]    Report Status 08/26/2022 FINAL  Final  Culture, blood (Routine X 2) w Reflex to ID Panel     Status: None   Collection Time: 08/21/22 11:01 PM   Specimen: BLOOD  Result Value Ref Range Status   Specimen  Description BLOOD BLOOD LEFT ARM  Final   Special Requests   Final    BOTTLES DRAWN AEROBIC AND ANAEROBIC Blood Culture adequate volume   Culture   Final    NO GROWTH 5 DAYS Performed at [facility], [address], [place], [place] [address]    Report Status 08/26/2022 FINAL  Final  Expectorated Sputum Assessment w Gram Stain, Rflx to Resp Cult     Status: None   Collection Time: 08/22/22 11:22 AM   Specimen: Sputum  Result Value Ref Range Status   Specimen Description SPUTUM  Final   Special Requests EXPSU  Final   Sputum evaluation   Final    THIS SPECIMEN IS ACCEPTABLE FOR SPUTUM CULTURE Performed at [facility], [address], [place], [place] [address]    Report Status 08/22/2022 FINAL  Final  Culture, Respiratory w Gram Stain     Status: None   Collection Time: 08/22/22 11:22 AM   Specimen: SPU  Result Value Ref Range Status   Specimen Description   Final    SPUTUM Performed at [facility], [address], [place], [place] [address]    Special Requests   Final    EXPSU Reflexed from [id number] Performed at [facility], [address] [place], [place] [address]    Gram Stain   Final    ABUNDANT SQUAMOUS EPITHELIAL CELLS PRESENT FEW WBC PRESENT, PREDOMINANTLY PMN ABUNDANT GRAM VARIABLE ROD ABUNDANT GRAM POSITIVE COCCI IN PAIRS    Culture   Final    Normal respiratory flora-no Staph aureus or Pseudomonas seen Performed at [facility], [address] [place], [place] [address]    Report Status 08/25/2022 FINAL  Final    Coagulation Studies: No results for input(s): "LABPROT", "INR" in the last 72 hours.  Urinalysis: No results for input(s): "COLORURINE", "LABSPEC", "PHURINE", "GLUCOSEU", "HGBUR", "BILIRUBINUR", "KETONESUR", "PROTEINUR", "UROBILINOGEN", "NITRITE", "LEUKOCYTESUR" in the last 72 hours.  Invalid input(s): "APPERANCEUR"     Imaging: CT CHEST WO CONTRAST  Result Date: 09/05/2022 CLINICAL DATA:   Pneumonia EXAM: CT CHEST WITHOUT CONTRAST TECHNIQUE: Multidetector CT imaging of the chest was performed following the standard protocol without IV contrast. RADIATION DOSE REDUCTION: This exam was performed according to the departmental dose-optimization program which includes automated exposure control, adjustment of the mA and/or kV according to patient size and/or use of iterative reconstruction technique. COMPARISON:  Chest CT dated  August 21, 2022; lumbar spine MRI dated Aug 02, 2022 FINDINGS: Cardiovascular: Normal heart size. No pericardial effusion. Moderate calcified plaque of the ascending thoracic aorta. Redemonstrated contour abnormality of the distal thoracic aorta, unchanged when compared with the prior exam severe coronary artery calcifications. Mediastinum/Nodes: Esophagus and thyroid are unremarkable. Decreased size of mediastinal lymph nodes. Reference AP window lymph node measuring 7 mm in short axis on series 2, image 56, previously 9 mm. Lungs/Pleura: Central airways are patent. Interval improvement of bilateral lung opacities with mild residual ground-glass and scattered consolidations. Small bilateral pleural effusions, decreased in size when compared with the prior exam. Upper Abdomen: Partially visualized aortic stent graft. Posterior diverticulum of the stomach. No acute abnormality. Musculoskeletal: Focal endplate irregularity of L1-L2 which correlates with discitis osteomyelitis described on prior lumbar spine MRI. No chest wall mass or suspicious bone lesions identified. IMPRESSION: 1. Interval improvement of bilateral lung opacities with mild residual ground-glass and scattered consolidations, likely improving multifocal pneumonia. 2. Small bilateral pleural effusions, decreased in size when compared with the prior exam. 3. Decreased size of mediastinal lymph nodes, consistent with resolving reactive adenopathy. 4. Coronary artery calcifications and aortic Atherosclerosis (ICD10-I70.0).  5. Unchanged contour abnormality of the distal ascending thoracic aorta, possibly a chronic penetrating atherosclerotic ulcer or aneurysm. Chest CTA could be performed for further characterization. Electronically Signed   By: Allegra Lai M.D.   On: 09/05/2022 14:23     Medications:    ceFAZolin Stopped (09/06/22 2147)    apixaban  2.5 mg Oral BID   Chlorhexidine Gluconate Cloth  6 each Topical Daily   cyanocobalamin  1,000 mcg Oral Daily   diltiazem  360 mg Oral Daily   feeding supplement  237 mL Oral BID BM   metoprolol succinate  100 mg Oral Daily   multivitamins with iron  1 tablet Oral Daily   pantoprazole  40 mg Oral Daily   polyethylene glycol  17 g Oral Daily   rosuvastatin  40 mg Oral Daily   senna  1 tablet Oral Daily   acetaminophen, albuterol, dextromethorphan-guaiFENesin, melatonin, ondansetron (ZOFRAN) IV, simethicone, sodium chloride, traMADol  Assessment/ Plan:  [name] is a 87 y.o.  male with recent prolonged admission for aortic ulcer s/p endovascular repair 07/24/22, MSSA bacteremia with L spine osteomyelitis and discitis, chronic diastolic heart failure, hypertension, hyperlipidemia, coronary artery disease, atrial fibrillation who was admitted to [facility] on 08/18/2022 for HCAP (healthcare-associated pneumonia) [J18.9] Acute on chronic respiratory failure with hypoxia (HCC) [J96.21] Sepsis, due to unspecified organism, unspecified whether acute organ dysfunction present (HCC) [A41.9]     1.  Acute kidney injury with proteinuria and hematuria: Baseline creatinine of 1.15 with normal GFR on 5/29. ANA positive but double-stranded DNA, anti-GBM, c-ANCA p-ANCA, MPO, PR-3, ENA negative.  Complements normal.   Differential diagnosis of AKI is extensive. Patient had pleural effusion/atypical infection/pulmonary edema noted on CT on June 19.  Extensive atherosclerosis and coronary atherosclerosis was noted. No IV contrast exposure recently. Blood pressure has been  in the 140s to 150s.  Baseline creatinine of 0.98 from 07/29/2022. It has subsequently continued to increase and is up to 2.97 today. 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 Renal ultrasound on 08/23/2022 negative for obstruction.  AKI likely secondary to ATN.  Other differential includes acute interstitial nephritis. No acute indication for dialysis.  - holding lisinopril - No updated labs today - 1750 ml urine recorded  in previous 24 hrs -Receiving albumin  - Will schedule follow up in our office   2.  Acute on chronic diastolic heart failure. With LE edema  Ejection fraction 55 to 60% with grade 1 diastolic dysfunction noted.  - appreciate cardiology input.   3.  Anemia with renal failure: PRBC transfusion on 6/18.   Lab Results  Component Value Date   HGB 8.9 (L) 09/02/2022   Hgb stable    LOS: 20   7/6/202410:19 AM

## 2022-09-07 NOTE — Progress Notes (Signed)
Progress Note   Patient: Brett Riley NWG:[id number] DOB: Apr 28, 1935 DOA: 08/18/2022     20 DOS: the patient was seen and examined on 09/07/2022   Brief hospital course:  87 y.o. male with medical history significant of chronic dCHF, HTN, HLD, CAD, CKD-3a, anemia, chronic A fib on Eliquis, who presents 08/18/2022 to ED from rehab, chief complaint SOB.  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 Net IO Since Admission: -5,549.14 mL [08/20/22 1608]. Heparin stopped d/t rectal bleeding overnight.  06/19: euvolemic per cardio, d/c lasix, O2 requirement still high, CT chest reviewed, ?edema/atypical infection, given clinically worsening respiratory status have asked pulmonary and ID to consult. Have d/c amiodarone and ordered steroids in case amio toxicity.  06/20: ID and pulmonary assisting with the management.   6/22-6/25: patient feels clinically improved. Stable with oxygen via [place] and weaned down to 2-3L. Patient requiring judicial adjustments to balance between hypervolemia and renal injury with diuresis. Unable to pull fluid from lungs with thoracentesis so medical management is best option. His IV Abx to treat his vertebral osteomyelitis from previous admission continue unchanged and managed by ID.    6/28: Bed offered at [facility].  Authorization pending 7/1: No status changes.  Per patient's daughter he has been seeming more depressed.  I have offered outpatient referral to psychiatry.   7/2: Insurance authorization for LTAC continues to be pending   7/3 : Peer-to-peer  review done.  Not a candidate for LTAC.  Approved for skilled nursing facility patient       Assessment and Plan:  Principal Problem:   Acute respiratory failure with hypoxia (HCC) Active Problems:   Acute on chronic heart failure with preserved ejection fraction (HFpEF) (HCC)   MSSA bacteremia   Vertebral osteomyelitis (HCC)   CAD (coronary artery disease)   Myocardial injury   Hypertension   Hyperlipidemia   Chronic kidney disease, stage 3a (HCC)   Normocytic anemia   Atrial fibrillation, chronic (HCC)   Paroxysmal atrial fibrillation (HCC)   HCAP (healthcare-associated pneumonia)   Sepsis (HCC)   Pleural effusion   Amiodarone pulmonary toxicity   Acute on chronic HFrEF (heart failure with reduced ejection fraction) (HCC)   Pleural effusion on right   Status post thoracentesis   Heart failure with preserved ejection fraction (HCC)   Stage 3 chronic kidney disease (HCC)   Acute on chronic respiratory failure with hypoxia due to acute on chronic CHF  possible pneumonia, bilateral small pleural effusion- as seen on chest CT Not on oxygen supplementation at baseline Currently on 3L with O2 saturation in upper 90s. - wean as tolerated -Completed course of azithromycin. - Continue Ancef as written for his osteomyelitis.  Last dose 7/12 - incentive spirometer q1hr - Off steroids -Medically stable for discharge.     Repeat CT scan of the chest 07/06 shows 1. Interval improvement of bilateral lung opacities with mild residual ground-glass and scattered consolidations, likely improving multifocal pneumonia. 2. Small bilateral pleural effusions, decreased in size when compared with the prior exam. 3. Decreased size of mediastinal lymph nodes, consistent with resolving reactive adenopathy. 4.  Coronary artery calcifications and aortic Atherosclerosis (ICD10-I70.0). 5. Unchanged contour abnormality of the distal ascending thoracic aorta, possibly a chronic penetrating  atherosclerotic ulcer or aneurysm. Chest CTA could be performed for further characterization.     Acute on chronic diastolic CHF- Markedly net negative.  Appears euvolemic Plan: Lasix on hold due to worsening renal function TED hose Strict I's and O's, daily weights   Pleural effusion- IR unable to perform thoracentesis 6/20, 6/21. Medical management. Clinically improving respiratory status.      Atrial fibrillation, chronic (HCC): Heart rate now well controlled in 60-70s diltiazem, metoprolol per cardiology Lisinopril held with AKI Restarted eliquis (renally dosed) in setting of no new bleeding and patient's hgb has been stable >5 days after concern for GI bleed early in admission on heparin gtt.   Recent hx of MSSA bacteremia vertebral osteomyelitis (HCC): continue IV Ancef until 7/12. Follow up with ID outpatient 7/11 repeat Blood culture NGTD   CAD  Myocardial injury  HLD:  trop  258, possibly due to demand ischemia secondary to CHF exacerbation.  Patient does not have chest pain. Crestor, beta blocker, Aspirin 81 discontinued as he's on eliquis  Cardiology following  May need coronary angiography at some point but will defer for now     AKI on Chronic kidney disease, stage 3a (HCC):  Baseline creatinine 1.5-1.9.  Uptrending creatinine level Hold furosemide Administer albumin 12.5 g daily Follow BMP Nephrology following     Normocytic anemia: hgb stable around 8.5-9. S/p 1 unit PRBC 06/18 ABLA w/ rectal bleed   Hypertension Continue to closely monitor vital signs.   Resolved hypokalemia   GERD PPI    Right knee pain -X-ray with chronic arthritic changes.  Recommend frequent mobility     Hypokalemia Secondary to diuretic therapy.  Supplemented             Subjective: Patient is seen and examined at the bedside.  He is sitting up in bed and has no new complaints.  Wife is at the bedside.  Physical Exam: Vitals:   09/06/22 2313 09/07/22 0443  09/07/22 0444 09/07/22 1234  BP: (!) 146/74  (!) 151/77 118/61  Pulse: 66  66 65  Resp: 20  20 16   Temp: 98.3 F (36.8 C)  98 F (36.7 C) 98.9 F (37.2 C)  TempSrc:    Oral  SpO2: 95%  95% 95%  Weight:  88.7 kg    Height:       General exam: NAD.  Lying in bed.  Sleeping but arouses easily Respiratory system: Right-sided crackles.  Normal work of breathing.  3 L Cardiovascular system: S1-S2, RRR, no murmurs, no pedal edema Gastrointestinal system: Soft, NT/ND, normal bowel sounds Central nervous system: Alert and oriented. No focal neurological deficits. Extremities: Right lower extremity decreased ROM Skin: No rashes, lesions or ulcers Psychiatry: Judgement and insight appear normal. Mood & affect flattened.     Data Reviewed: Labs reviewed.  Potassium 3.4, creatinine 3.22, BUN 85 There are no new results to review at this time.  Family Communication: Plan of care discussed with patient and his wife at the bedside.  They verbalized understanding and agree with the plan  Disposition: Status is: Inpatient Remains inpatient appropriate because: Awaiting placement  Planned Discharge Destination: Skilled nursing facility    Time spent: 33 minutes  Author: Lucile Shutters, MD 09/07/2022 12:55 PM  For on call review [email or web address].

## 2022-09-08 DIAGNOSIS — J9601 Acute respiratory failure with hypoxia: Secondary | ICD-10-CM | POA: Diagnosis not present

## 2022-09-08 NOTE — Progress Notes (Signed)
Physical Therapy Treatment Patient Details Name: IROH JESCHKE MRN: [id number] DOB: 01-Aug-1935 Today's Date: 09/08/2022   History of Present Illness Lakhi Dils is a 87 y/o M admitted with respiratory failure; other PMH includes MI, CHF, a-fib, HTN, anemia. Was at SNF doing rehab prior to admission; was previously admitted 5/18-6/7.    PT Comments  Pt tolerated treatment well today, with overall progression and continued participation limited secondary to fatigue. Treatment with focus on proximal stability and improved activity tolerance with upright functional mobility.   Pt able to improve overall assist levels and seated balance today compared to previous sessions. He required min-supervision for bil rolling and mod-max assist for supine<>sit transfers. Pt able to perform static seated balance for ~46min at EOB with BUE support, no LOB. Able to momentarily reach outside BOS with UUE without LOB. Declining participation with BLE therex at end of session due to fatigue. Educated pt and spouse to perform outside of PT sessions; they were able to verbalize exercises performed outside of therapy services. Pt seemed in better spirits after PT session.  Pt will continue to benefit from skilled acute PT services to address deficits for return to baseline function. Will continue per POC.     Assistance Recommended at Discharge Frequent or constant Supervision/Assistance  If plan is discharge home, recommend the following:  Can travel by private vehicle    Assistance with cooking/housework;Assist for transportation;Two people to help with walking and/or transfers;Two people to help with bathing/dressing/bathroom;Help with stairs or ramp for entrance   No  Equipment Recommendations  None recommended by PT       Precautions / Restrictions Precautions Precautions: Fall Restrictions Weight Bearing Restrictions: No Other Position/Activity Restrictions: Strict I/O; DNR     Mobility  Bed  Mobility Overal bed mobility: Needs Assistance   Rolling: Min assist, Supervision   Supine to sit: Max assist Sit to supine: Mod assist   General bed mobility comments: able to perform supine scooting, bed in trendelenburg, use of BUE for support on railing to pull towards HOB. Min assist for full sidelying with rolling to the LEFT, but able to be supervision for RIGHT rolling. Max assist for supine>sit, with increased assist for trunk facilitation, use of BUE for support on bedrails. Mod assist for BLE facilitation onto bed for sit>supine transfer.    Transfers                   General transfer comment: deferred       Balance Overall balance assessment: Needs assistance Sitting-balance support: Feet supported, Bilateral upper extremity supported Sitting balance-Leahy Scale: Fair Sitting balance - Comments: demonstrates mild R lateral and posterior lean. Requires BUE for support with static sitting, but able to reach outside BOS with UUE support without LOB.                                    Cognition Arousal/Alertness: Awake/alert Behavior During Therapy: Flat affect Overall Cognitive Status: Within Functional Limits for tasks assessed                                 General Comments: HOH, increased time needed to follow commands.        Exercises Other Exercises Other Exercises: Seated balance at EOB, focus on correcting R posterior lean without support from therapist.    General Comments General  comments (skin integrity, edema, etc.): assisted in pericare and linen change      Pertinent Vitals/Pain Pain Assessment Pain Assessment: No/denies pain     PT Goals (current goals can now be found in the care plan section) Acute Rehab PT Goals Patient Stated Goal: get stronger PT Goal Formulation: With patient/family Time For Goal Achievement: 09/10/22 Potential to Achieve Goals: Fair Progress towards PT goals: Progressing toward  goals    Frequency    Min 2X/week      PT Plan Current plan remains appropriate    Co-evaluation PT/OT/SLP Co-Evaluation/Treatment: Yes Reason for Co-Treatment: Necessary to address cognition/behavior during functional activity;For patient/therapist safety;To address functional/ADL transfers PT goals addressed during session: Mobility/safety with mobility;Balance OT goals addressed during session: ADL's and self-care;Proper use of Adaptive equipment and DME      AM-PAC PT "6 Clicks" Mobility   Outcome Measure  Help needed turning from your back to your side while in a flat bed without using bedrails?: A Little Help needed moving from lying on your back to sitting on the side of a flat bed without using bedrails?: A Lot Help needed moving to and from a bed to a chair (including a wheelchair)?: A Lot Help needed standing up from a chair using your arms (e.g., wheelchair or bedside chair)?: Total Help needed to walk in hospital room?: Total Help needed climbing 3-5 steps with a railing? : Total 6 Click Score: 10    End of Session   Activity Tolerance: Patient tolerated treatment well;Patient limited by fatigue Patient left: with call bell/phone within reach;in bed;with bed alarm set Nurse Communication: Mobility status PT Visit Diagnosis: Muscle weakness (generalized) (M62.81);Difficulty in walking, not elsewhere classified (R26.2);Unsteadiness on feet (R26.81);Other abnormalities of gait and mobility (R26.89)     Time: 1610-9604 PT Time Calculation (min) (ACUTE ONLY): 20 min  Charges:    $Therapeutic Exercise: 8-22 mins PT General Charges $$ ACUTE PT VISIT: 1 Visit                       Vira Blanco, PT, DPT 4:01 PM,09/08/22 Physical Therapist - [facility]

## 2022-09-08 NOTE — Progress Notes (Signed)
Progress Note   Patient: Brett Riley GNF:[id number] DOB: 1935/09/02 DOA: 08/18/2022     21 DOS: the patient was seen and examined on 09/08/2022   Brief hospital course: 87 y.o. male with medical history significant of chronic dCHF, HTN, HLD, CAD, CKD-3a, anemia, chronic A fib on Eliquis, who presents 08/18/2022 to ED from rehab, chief complaint SOB.  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 Net IO Since Admission: -5,549.14 mL [08/20/22 1608]. Heparin stopped d/t rectal bleeding overnight.  06/19: euvolemic per cardio, d/c lasix, O2 requirement still high, CT chest reviewed, ?edema/atypical infection, given clinically worsening respiratory status have asked pulmonary and ID to consult. Have d/c amiodarone and ordered steroids in case amio toxicity.  06/20: ID and pulmonary assisting with the management.   6/22-6/25: patient feels clinically improved. Stable with oxygen via [place] and weaned down to 2-3L. Patient requiring judicial adjustments to balance between hypervolemia and renal injury with diuresis. Unable to pull fluid from lungs with thoracentesis so medical management is best option. His IV Abx to treat his vertebral osteomyelitis from previous admission continue unchanged and managed by ID.    6/28: Bed offered at [facility].  Authorization pending 7/1: No status changes.  Per patient's daughter he has been seeming more depressed.  I have offered outpatient referral to psychiatry.   7/2: Insurance authorization for LTAC continues to be pending   7/3 : Peer-to-peer  review done.  Not a candidate for LTAC.  Approved for skilled nursing facility patient      Assessment and Plan:  Principal Problem:   Acute respiratory failure with hypoxia (HCC) Active Problems:   Acute on chronic heart failure with preserved ejection fraction (HFpEF) (HCC)   MSSA bacteremia   Vertebral osteomyelitis (HCC)   CAD (coronary artery disease)   Myocardial injury   Hypertension   Hyperlipidemia   Chronic kidney disease, stage 3a (HCC)   Normocytic anemia   Atrial fibrillation, chronic (HCC)   Paroxysmal atrial fibrillation (HCC)   HCAP (healthcare-associated pneumonia)   Sepsis (HCC)   Pleural effusion   Amiodarone pulmonary toxicity   Acute on chronic HFrEF (heart failure with reduced ejection fraction) (HCC)   Pleural effusion on right   Status post thoracentesis   Heart failure with preserved ejection fraction (HCC)   Stage 3 chronic kidney disease (HCC)   Acute on chronic respiratory failure with hypoxia due to acute on chronic CHF  possible pneumonia, bilateral small pleural effusion- as seen on chest CT Not on oxygen supplementation at baseline Currently on 3L with O2 saturation in upper 90s. - wean as tolerated -Completed course of azithromycin. - Continue Ancef as written for his osteomyelitis.  Last dose 7/12 - incentive spirometer q1hr - Off steroids      Repeat CT scan of the chest 07/06 shows 1. Interval improvement of bilateral lung opacities with mild residual ground-glass and scattered consolidations, likely improving multifocal pneumonia. 2. Small bilateral pleural effusions, decreased in size when compared with the prior exam. 3. Decreased size of mediastinal lymph nodes, consistent with resolving reactive adenopathy. 4. Coronary artery calcifications and aortic  Atherosclerosis (ICD10-I70.0). 5. Unchanged contour abnormality of the distal ascending thoracic aorta, possibly a chronic penetrating atherosclerotic ulcer or aneurysm.  Chest CTA could be performed for further characterization.     Acute on chronic diastolic CHF- Markedly net negative.  Appears euvolemic Plan: Lasix on hold due to worsening renal function TED hose Strict I's and O's, daily weights   Pleural effusion- IR unable to perform thoracentesis 6/20, 6/21. Medical management. Clinically improving respiratory status.      Atrial fibrillation, chronic (HCC): Heart rate now well controlled in 60-70s diltiazem, metoprolol per cardiology Lisinopril held with AKI Restarted eliquis (renally dosed) in setting of no new bleeding and patient's hgb has been stable >5 days after concern for GI bleed early in admission on heparin gtt.   Recent hx of MSSA bacteremia vertebral osteomyelitis (HCC): continue IV Ancef until 7/12. Follow up with ID outpatient 7/11 repeat Blood culture NGTD   CAD  Myocardial injury  HLD:  trop  258, possibly due to demand ischemia secondary to CHF exacerbation.  Patient does not have chest pain. Crestor, beta blocker, Aspirin 81 discontinued as he's on eliquis  Cardiology following  May need coronary angiography at some point but will defer for now     AKI on Chronic kidney disease, stage 3a (HCC):  Baseline creatinine 1.5-1.9.  Uptrending creatinine level Hold furosemide Administer albumin 12.5 g daily Follow BMP Nephrology following     Normocytic anemia: hgb stable around 8.5-9. S/p 1 unit PRBC 06/18 ABLA w/ rectal bleed   Hypertension Continue to closely monitor vital signs.   Resolved hypokalemia   GERD PPI    Right knee pain -X-ray with chronic arthritic changes.  Recommend frequent mobility     Hypokalemia Secondary to diuretic therapy.  Supplemented        Subjective: Patient is seen and examined at the bedside.  Wife is at the bedside.  Complains of intermittent nosebleeds.  Using a saline spray without any improvement.  Off oxygen  Physical Exam: Vitals:   09/08/22 0033 09/08/22 0440  09/08/22 0744 09/08/22 1127  BP: (!) 157/72  (!) 159/73 136/65  Pulse: 72  67 65  Resp: 19  16 16   Temp: 97.9 F (36.6 C)  98 F (36.7 C) 97.8 F (36.6 C)  TempSrc: Oral     SpO2: 95%  93% 97%  Weight:  88 kg    Height:       General exam: NAD.  Lying in bed. Awake Respiratory system: Right-sided crackles.  Normal work of breathing.  3 L Cardiovascular system: S1-S2, RRR, no murmurs, no pedal edema Gastrointestinal system: Soft, NT/ND, normal bowel sounds Central nervous system: Alert and oriented. No focal neurological deficits. Extremities: Right lower extremity decreased ROM Skin: No rashes, lesions or ulcers Psychiatry: Judgement and insight appear normal. Mood & affect flattened.    Data Reviewed:  There are no new results to review at this time.  Family Communication: Plan of care discussed with patient's wife at the bedside.  She is concerned about his discharge and will want him medically stable prior to discharge to rehab.  Disposition: Status is: Inpatient Remains inpatient appropriate because: Awaiting discharge to SNF  Planned Discharge Destination: Skilled nursing facility    Time spent: 33 minutes  Author: Lucile Shutters, MD 09/08/2022 1:45 PM  For on call review [email or web address].

## 2022-09-08 NOTE — Progress Notes (Addendum)
[facility]  ROUNDING NOTE   Subjective:   Patient sitting up in bed Spouse at bedside Wife asked questions about plan of care, answered to the best of our ability  2L [place] TED hose in place  Edema improving  UOP 1.4L Creatinine 3.22  Objective:  Vital signs in last 24 hours:  Temp:  [97.9 F (36.6 C)-98.9 F (37.2 C)] 98 F (36.7 C) (07/07 0744) Pulse Rate:  [64-72] 67 (07/07 0744) Resp:  [16-21] 16 (07/07 0744) BP: (118-159)/(60-73) 159/73 (07/07 0744) SpO2:  [93 %-96 %] 93 % (07/07 0744) Weight:  [88 kg] 88 kg (07/07 0440)  Weight change: -0.7 kg Filed Weights   09/06/22 0510 09/07/22 0443 09/08/22 0440  Weight: 86.2 kg 88.7 kg 88 kg    Intake/Output: I/O last 3 completed shifts: In: 2921.1 [P.O.:240; IV Piggyback:2681.1] Out: 2700 [Urine:2700]   Intake/Output this shift:  Total I/O In: 240 [P.O.:240] Out: -   Physical Exam: General: NAD, laying in bed  Head: Normocephalic, atraumatic. Moist oral mucosal membranes  Eyes: Anicteric  Lungs:  Diminished, [place] O2  Heart: Regular rate and rhythm  Abdomen:  Soft, nontender  Extremities:  + peripheral edema.  TED hose  Neurologic: Alert and oriented, moving all four extremities  Skin: No lesions  Access: none    Basic Metabolic Panel: Recent Labs  Lab 09/02/22 0930 09/03/22 1000 09/05/22 0355 09/06/22 1005 09/07/22 0900  NA 134* 137 135 139 140  K 3.5 3.1* 3.0* 3.5 3.4*  CL 99 101 101 105 105  CO2 23 26 23 25 24   GLUCOSE 120* 152* 98 138* 90  BUN 100* 98* 95* 88* 85*  CREATININE 2.97* 2.83* 3.09* 3.10* 3.22*  CALCIUM 8.2* 8.3* 8.2* 8.6* 8.6*     Liver Function Tests: Recent Labs  Lab 09/02/22 1800  ALBUMIN 2.2*    No results for input(s): "LIPASE", "AMYLASE" in the last 168 hours. No results for input(s): "AMMONIA" in the last 168 hours.  CBC: Recent Labs  Lab 09/02/22 0930 09/07/22 0900  WBC 8.0 6.6  NEUTROABS 6.7  --   HGB 8.9* 8.3*  HCT 27.4* 25.9*  MCV 91.9 92.2   PLT 236 148*     Cardiac Enzymes: No results for input(s): "CKTOTAL", "CKMB", "CKMBINDEX", "TROPONINI" in the last 168 hours.   BNP: Invalid input(s): "POCBNP"  CBG: Recent Labs  Lab 09/02/22 0827  GLUCAP 92     Microbiology: Results for orders placed or performed during the hospital encounter of 08/18/22  Blood culture (routine x 2)     Status: None   Collection Time: 08/18/22 10:45 AM   Specimen: BLOOD LEFT ARM  Result Value Ref Range Status   Specimen Description BLOOD LEFT ARM  Final   Special Requests   Final    BOTTLES DRAWN AEROBIC AND ANAEROBIC Blood Culture adequate volume   Culture   Final    NO GROWTH 5 DAYS Performed at [facility], [address] [place], [place] [address]    Report Status 08/23/2022 FINAL  Final  Resp Panel by RT-PCR (Flu A&B, Covid) Anterior Nasal Swab     Status: None   Collection Time: 08/18/22 10:46 AM   Specimen: Anterior Nasal Swab  Result Value Ref Range Status   SARS Coronavirus 2 by RT PCR NEGATIVE NEGATIVE Final    Comment: (NOTE) SARS-CoV-2 target nucleic acids are NOT DETECTED.  The SARS-CoV-2 RNA is generally detectable in upper respiratory specimens during the acute phase of infection. The lowest  concentration of SARS-CoV-2 viral copies this assay can detect is 138 copies/mL. A negative result does not preclude SARS-Cov-2 infection and should not be used as the sole basis for treatment or other patient management decisions. A negative result may occur with  improper specimen collection/handling, submission of specimen other than nasopharyngeal swab, presence of viral mutation(s) within the areas targeted by this assay, and inadequate number of viral copies(<138 copies/mL). A negative result must be combined with clinical observations, patient history, and epidemiological information. The expected result is Negative.  Fact Sheet for Patients:  BloggerCourse.com  Fact Sheet for  Healthcare Providers:  SeriousBroker.it  This test is no t yet approved or cleared by the [place] FDA and  has been authorized for detection and/or diagnosis of SARS-CoV-2 by FDA under an Emergency Use Authorization (EUA). This EUA will remain  in effect (meaning this test can be used) for the duration of the COVID-19 declaration under Section 564(b)(1) of the Act, 21 U.S.C.section 360bbb-3(b)(1), unless the authorization is terminated  or revoked sooner.       Influenza A by PCR NEGATIVE NEGATIVE Final   Influenza B by PCR NEGATIVE NEGATIVE Final    Comment: (NOTE) The Xpert Xpress SARS-CoV-2/FLU/RSV plus assay is intended as an aid in the diagnosis of influenza from Nasopharyngeal swab specimens and should not be used as a sole basis for treatment. Nasal washings and aspirates are unacceptable for Xpert Xpress SARS-CoV-2/FLU/RSV testing.  Fact Sheet for Patients: BloggerCourse.com  Fact Sheet for Healthcare Providers: SeriousBroker.it  This test is not yet approved or cleared by the [place] FDA and has been authorized for detection and/or diagnosis of SARS-CoV-2 by FDA under an Emergency Use Authorization (EUA). This EUA will remain in effect (meaning this test can be used) for the duration of the COVID-19 declaration under Section 564(b)(1) of the Act, 21 U.S.C. section 360bbb-3(b)(1), unless the authorization is terminated or revoked.  Performed at [facility], [address] [place], [place] [address]   Culture, blood (Routine X 2) w Reflex to ID Panel     Status: None   Collection Time: 08/18/22  9:19 PM   Specimen: BLOOD LEFT ARM  Result Value Ref Range Status   Specimen Description BLOOD LEFT ARM  Final   Special Requests   Final    BOTTLES DRAWN AEROBIC AND ANAEROBIC Blood Culture adequate volume   Culture   Final    NO GROWTH 5 DAYS Performed at [facility], [address] [place], [place] [address]    Report Status 08/23/2022 FINAL  Final  Respiratory (~20 pathogens) panel by PCR     Status: None   Collection Time: 08/21/22  1:50 PM   Specimen: Nasopharyngeal Swab; Respiratory  Result Value Ref Range Status   Adenovirus NOT DETECTED NOT DETECTED Final   Coronavirus 229E NOT DETECTED NOT DETECTED Final    Comment: (NOTE) The Coronavirus on the Respiratory Panel, DOES NOT test for the novel  Coronavirus (2019 nCoV)    Coronavirus HKU1 NOT DETECTED NOT DETECTED Final   Coronavirus NL63 NOT DETECTED NOT DETECTED Final   Coronavirus OC43 NOT DETECTED NOT DETECTED Final   Metapneumovirus NOT DETECTED NOT DETECTED Final   Rhinovirus / Enterovirus NOT DETECTED NOT DETECTED Final   Influenza A NOT DETECTED NOT DETECTED Final   Influenza B NOT DETECTED NOT DETECTED Final   Parainfluenza Virus 1 NOT DETECTED NOT DETECTED Final   Parainfluenza Virus 2 NOT DETECTED NOT DETECTED Final   Parainfluenza  Virus 3 NOT DETECTED NOT DETECTED Final   Parainfluenza Virus 4 NOT DETECTED NOT DETECTED Final   Respiratory Syncytial Virus NOT DETECTED NOT DETECTED Final   Bordetella pertussis NOT DETECTED NOT DETECTED Final   Bordetella Parapertussis NOT DETECTED NOT DETECTED Final   Chlamydophila pneumoniae NOT DETECTED NOT DETECTED Final   Mycoplasma pneumoniae NOT DETECTED NOT DETECTED Final    Comment: Performed at [facility], [address], [place], [place] [address]  Culture, blood (Routine X 2) w Reflex to ID Panel     Status: None   Collection Time: 08/21/22  8:58 PM   Specimen: BLOOD  Result Value Ref Range Status   Specimen Description BLOOD LEFT WRIST  Final   Special Requests   Final    BOTTLES DRAWN AEROBIC AND ANAEROBIC Blood Culture adequate volume   Culture   Final    NO GROWTH 5 DAYS Performed at [facility], [address] [place], [place] [address]    Report Status 08/26/2022 FINAL  Final  Culture, blood  (Routine X 2) w Reflex to ID Panel     Status: None   Collection Time: 08/21/22 11:01 PM   Specimen: BLOOD  Result Value Ref Range Status   Specimen Description BLOOD BLOOD LEFT ARM  Final   Special Requests   Final    BOTTLES DRAWN AEROBIC AND ANAEROBIC Blood Culture adequate volume   Culture   Final    NO GROWTH 5 DAYS Performed at [facility], [address], [place], [place] [address]    Report Status 08/26/2022 FINAL  Final  Expectorated Sputum Assessment w Gram Stain, Rflx to Resp Cult     Status: None   Collection Time: 08/22/22 11:22 AM   Specimen: Sputum  Result Value Ref Range Status   Specimen Description SPUTUM  Final   Special Requests EXPSU  Final   Sputum evaluation   Final    THIS SPECIMEN IS ACCEPTABLE FOR SPUTUM CULTURE Performed at [facility], [address], [place], [place] [address]    Report Status 08/22/2022 FINAL  Final  Culture, Respiratory w Gram Stain     Status: None   Collection Time: 08/22/22 11:22 AM   Specimen: SPU  Result Value Ref Range Status   Specimen Description   Final    SPUTUM Performed at [facility], [address], [place], [place] [address]    Special Requests   Final    EXPSU Reflexed from [id number] Performed at [facility], [address] [place], [place] [address]    Gram Stain   Final    ABUNDANT SQUAMOUS EPITHELIAL CELLS PRESENT FEW WBC PRESENT, PREDOMINANTLY PMN ABUNDANT GRAM VARIABLE ROD ABUNDANT GRAM POSITIVE COCCI IN PAIRS    Culture   Final    Normal respiratory flora-no Staph aureus or Pseudomonas seen Performed at [facility], [address], [place], [place] [address]    Report Status 08/25/2022 FINAL  Final    Coagulation Studies: No results for input(s): "LABPROT", "INR" in the last 72 hours.  Urinalysis: No results for input(s): "COLORURINE", "LABSPEC", "PHURINE", "GLUCOSEU", "HGBUR", "BILIRUBINUR", "KETONESUR", "PROTEINUR", "UROBILINOGEN", "NITRITE",  "LEUKOCYTESUR" in the last 72 hours.  Invalid input(s): "APPERANCEUR"     Imaging: US RENAL ARTERY DUPLEX LIMITED  Result Date: 09/07/2022 CLINICAL DATA:  Post aortic stent placement, now with acute kidney injury. EXAM: RENAL DUPLEX ULTRASOUND TECHNIQUE: Duplex and color Doppler ultrasound was utilized to evaluate blood flow in the renal arteries  and kidneys. COMPARISON:  Renal ultrasound-08/23/2022 CTA abdomen and pelvis-07/20/2022 FINDINGS: Examination is degraded due to overlying bowel gas and poor sonographic window. Right kidney: Normal cortical thickness, echogenicity and size, measuring 11.6 cm in length. No focal renal lesions. No echogenic renal stones. No urinary obstruction. Left kidney: Normal cortical thickness, echogenicity and size, measuring 12.4 cm in length. No focal renal lesions. No echogenic renal stones. No urinary obstruction. Urinary bladder: Appears normal given degree of distention. Right Renal Artery Velocities: Origin:  53 cm/sec Mid:  64 cm/sec Hilum:  42 cm/sec Interlobar:  Not visualized Arcuate: Not visualized Left Renal Artery Velocities: Origin:  59 cm/sec Mid:  57 cm/sec Hilum:  42 cm/sec Interlobar:  Not visualized Arcuate:  Not visualized Aortic Velocity: 46 cm/sec Right Renal-Aortic Ratios: Origin: 1.1 Mid:  1.4 Hilum: 0.9 Interlobar: N/A Arcuate: N/A Left Renal-Aortic Ratios: Origin: 1.3 Mid: 1.2 Hilum: 0.9 Interlobar: N/A Arcuate: N/A Normal velocities and low resistance waveforms are demonstrated throughout the interrogated portions of the bilateral renal arteries though there is nonvisualization of the interlobar and arcuate arteries bilaterally. No evidence of abdominal aortic aneurysm. IMPRESSION: 1. No sonographic evidence of renal artery stenosis though there is non visualization of the bilateral interlobar and arcuate arteries bilaterally due to bowel gas and poor sonographic window. Note, personal review of CTA of the abdomen pelvis performed 07/20/2022  suggests at least 50% luminal narrowing of the dominant bilateral renal arteries (note is made of 3 separate left-sided renal arteries), though this finding is without associated delayed renal enhancement or asymmetric renal atrophy. 2. No evidence of urinary obstruction. Electronically Signed   By: Simonne Come M.D.   On: 09/07/2022 11:39     Medications:    ceFAZolin 2 g (09/08/22 1045)    apixaban  2.5 mg Oral BID   Chlorhexidine Gluconate Cloth  6 each Topical Daily   cyanocobalamin  1,000 mcg Oral Daily   diltiazem  360 mg Oral Daily   feeding supplement  237 mL Oral BID BM   metoprolol succinate  100 mg Oral Daily   multivitamins with iron  1 tablet Oral Daily   pantoprazole  40 mg Oral Daily   polyethylene glycol  17 g Oral Daily   rosuvastatin  10 mg Oral Daily   senna  1 tablet Oral Daily   acetaminophen, albuterol, dextromethorphan-guaiFENesin, melatonin, ondansetron (ZOFRAN) IV, simethicone, sodium chloride, traMADol  Assessment/ Plan:  [name] is a 87 y.o.  male with recent prolonged admission for aortic ulcer s/p endovascular repair 07/24/22, MSSA bacteremia with L spine osteomyelitis and discitis, chronic diastolic heart failure, hypertension, hyperlipidemia, coronary artery disease, atrial fibrillation who was admitted to [facility] on 08/18/2022 for HCAP (healthcare-associated pneumonia) [J18.9] Acute on chronic respiratory failure with hypoxia (HCC) [J96.21] Sepsis, due to unspecified organism, unspecified whether acute organ dysfunction present (HCC) [A41.9]     1.  Acute kidney injury with proteinuria and hematuria: Baseline creatinine of 1.15 with normal GFR on 5/29. ANA positive but double-stranded DNA, anti-GBM, c-ANCA p-ANCA, MPO, PR-3, ENA negative.  Complements normal.   Differential diagnosis of AKI is extensive. Patient had pleural effusion/atypical infection/pulmonary edema noted on CT on June 19.  Extensive atherosclerosis and coronary atherosclerosis was  noted. No IV contrast exposure recently. Blood pressure has been in the 140s to 150s.  Baseline creatinine of 0.98 from 07/29/2022. It has subsequently continued to increase and is up to 2.97 today. 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 Renal ultrasound on 08/23/2022 negative for obstruction.  AKI likely secondary to ATN.  Other differential includes acute interstitial nephritis. - holding lisinopril and furosemide -Creatinine remained stable, 3.22.  Remains above stated baseline - 1.4L urine recorded in previous 24 hrs -Has received albumin during this admission -Discussed renal biopsy with patient and spouse which would provide definitive diagnosis of kidney injury.  Due to patient's current state of health, family would like to defer this until patient is in better health. - Will schedule follow up in our office at discharge   2.  Acute on chronic diastolic heart failure. With LE edema  Ejection fraction 55 to 60% with grade 1 diastolic dysfunction noted.  -Stabilized  3.  Anemia with renal failure: PRBC transfusion on 6/18.   Lab Results  Component Value Date   HGB 8.3 (L) 09/07/2022  Hemoglobin below desired range.  Will monitor for now.    LOS: 21   7/7/202411:01 AM

## 2022-09-08 NOTE — Plan of Care (Signed)
°  Problem: Education: °Goal: Ability to demonstrate management of disease process will improve °Outcome: Progressing °Goal: Ability to verbalize understanding of medication therapies will improve °Outcome: Progressing °Goal: Individualized Educational Video(s) °Outcome: Progressing °  °Problem: Activity: °Goal: Capacity to carry out activities will improve °Outcome: Progressing °  °Problem: Cardiac: °Goal: Ability to achieve and maintain adequate cardiopulmonary perfusion will improve °Outcome: Progressing °  °Problem: Activity: °Goal: Ability to tolerate increased activity will improve °Outcome: Progressing °  °Problem: Clinical Measurements: °Goal: Ability to maintain a body temperature in the normal range will improve °Outcome: Progressing °  °Problem: Respiratory: °Goal: Ability to maintain adequate ventilation will improve °Outcome: Progressing °Goal: Ability to maintain a clear airway will improve °Outcome: Progressing °  °Problem: Education: °Goal: Knowledge of General Education information will improve °Description: Including pain rating scale, medication(s)/side effects and non-pharmacologic comfort measures °Outcome: Progressing °  °Problem: Health Behavior/Discharge Planning: °Goal: Ability to manage health-related needs will improve °Outcome: Progressing °  °Problem: Clinical Measurements: °Goal: Ability to maintain clinical measurements within normal limits will improve °Outcome: Progressing °Goal: Will remain free from infection °Outcome: Progressing °Goal: Diagnostic test results will improve °Outcome: Progressing °Goal: Respiratory complications will improve °Outcome: Progressing °Goal: Cardiovascular complication will be avoided °Outcome: Progressing °  °Problem: Activity: °Goal: Risk for activity intolerance will decrease °Outcome: Progressing °  °Problem: Nutrition: °Goal: Adequate nutrition will be maintained °Outcome: Progressing °  °Problem: Coping: °Goal: Level of anxiety will  decrease °Outcome: Progressing °  °Problem: Elimination: °Goal: Will not experience complications related to bowel motility °Outcome: Progressing °Goal: Will not experience complications related to urinary retention °Outcome: Progressing °  °Problem: Pain Managment: °Goal: General experience of comfort will improve °Outcome: Progressing °  °Problem: Safety: °Goal: Ability to remain free from injury will improve °Outcome: Progressing °  °Problem: Skin Integrity: °Goal: Risk for impaired skin integrity will decrease °Outcome: Progressing °  °

## 2022-09-09 ENCOUNTER — Telehealth: Payer: Self-pay | Admitting: Internal Medicine

## 2022-09-09 DIAGNOSIS — J9601 Acute respiratory failure with hypoxia: Secondary | ICD-10-CM | POA: Diagnosis not present

## 2022-09-09 LAB — BASIC METABOLIC PANEL WITH GFR
Anion gap: 12 (ref 5–15)
BUN: 91 mg/dL — ABNORMAL HIGH (ref 8–23)
CO2: 22 mmol/L (ref 22–32)
Calcium: 8.5 mg/dL — ABNORMAL LOW (ref 8.9–10.3)
Chloride: 104 mmol/L (ref 98–111)
Creatinine, Ser: 3.56 mg/dL — ABNORMAL HIGH (ref 0.61–1.24)
GFR, Estimated: 16 mL/min — ABNORMAL LOW
Glucose, Bld: 122 mg/dL — ABNORMAL HIGH (ref 70–99)
Potassium: 3 mmol/L — ABNORMAL LOW (ref 3.5–5.1)
Sodium: 138 mmol/L (ref 135–145)

## 2022-09-09 MED ORDER — POTASSIUM CHLORIDE CRYS ER 20 MEQ PO TBCR
40.0000 meq | EXTENDED_RELEASE_TABLET | Freq: Once | ORAL | Status: AC
  Administered 2022-09-09: 40 meq via ORAL
  Filled 2022-09-09: qty 2

## 2022-09-09 NOTE — Progress Notes (Signed)
Spoke with Ms. Scoggins.  Added [facility] to the list last week and would now like to add [facility].

## 2022-09-09 NOTE — Telephone Encounter (Signed)
Patient's wife called and stated that Dr Lorin Picket has not called her. Patient's wife was advised that Dr Lorin Picket is seeing patients. Front office advised her that Dr Lorin Picket is able to read patient's hospital notes. She also wanted to know if Dr Lorin Picket could help her get patient into [facility] in [place]. Wife was advised tht Dr Lorin Picket does not have any pull to get him in any rehab of her choice, and that the Social Worker at the hospital she be able to help her and the patient.

## 2022-09-09 NOTE — Progress Notes (Signed)
Progress Note   Patient: Brett Riley EXB:[id number] DOB: 06/12/1935 DOA: 08/18/2022     22 DOS: the patient was seen and examined on 09/09/2022   Brief hospital course: 87 y.o. male with medical history significant of chronic dCHF, HTN, HLD, CAD, CKD-3a, anemia, chronic A fib on Eliquis, who presents 08/18/2022 to ED from rehab, chief complaint SOB.  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 Net IO Since Admission: -5,549.14 mL [08/20/22 1608]. Heparin stopped d/t rectal bleeding overnight.  06/19: euvolemic per cardio, d/c lasix, O2 requirement still high, CT chest reviewed, ?edema/atypical infection, given clinically worsening respiratory status have asked pulmonary and ID to consult. Have d/c amiodarone and ordered steroids in case amio toxicity.  06/20: ID and pulmonary assisting with the management.   6/22-6/25: patient feels clinically improved. Stable with oxygen via [place] and weaned down to 2-3L. Patient requiring judicial adjustments to balance between hypervolemia and renal injury with diuresis. Unable to pull fluid from lungs with thoracentesis so medical management is best option. His IV Abx to treat his vertebral osteomyelitis from previous admission continue unchanged and managed by ID.    6/28: Bed offered at [facility].  Authorization pending 7/1: No status changes.  Per patient's daughter he has been seeming more depressed.  I have offered outpatient referral to psychiatry.   7/2: Insurance authorization for LTAC continues to be pending   7/3 : Peer-to-peer  review done.  Not a candidate for LTAC.  Approved for skilled nursing facility patient  Assessment and Plan:  Principal Problem:   Acute respiratory failure with hypoxia (HCC) Active Problems:   Acute on chronic heart failure with preserved ejection fraction (HFpEF) (HCC)   MSSA bacteremia   Vertebral osteomyelitis (HCC)   CAD (coronary artery disease)   Myocardial injury   Hypertension   Hyperlipidemia   Chronic kidney disease, stage 3a (HCC)   Normocytic anemia   Atrial fibrillation, chronic (HCC)   Paroxysmal atrial fibrillation (HCC)   HCAP (healthcare-associated pneumonia)   Sepsis (HCC)   Pleural effusion   Amiodarone pulmonary toxicity   Acute on chronic HFrEF (heart failure with reduced ejection fraction) (HCC)   Pleural effusion on right   Status post thoracentesis   Heart failure with preserved ejection fraction (HCC)   Stage 3 chronic kidney disease (HCC)   Acute on chronic respiratory failure with hypoxia due to acute on chronic CHF  possible pneumonia, bilateral small pleural effusion- as seen on chest CT Not on oxygen supplementation at baseline Has been weaned off oxygen completely and is on room air - wean as tolerated -Completed course of azithromycin. - Continue Ancef as written for his osteomyelitis.  Last dose 7/12 - incentive spirometer q1hr - Off steroids      Repeat CT scan of the chest 07/06 shows 1. Interval improvement of bilateral lung opacities with mild residual ground-glass and scattered consolidations, likely improving multifocal pneumonia. 2. Small bilateral pleural effusions, decreased in size when compared with the prior exam. 3. Decreased size of mediastinal lymph nodes, consistent with resolving reactive adenopathy. 4. Coronary artery calcifications and aortic Atherosclerosis (ICD10-I70.0).  5. Unchanged contour abnormality of the distal ascending thoracic aorta, possibly a chronic penetrating atherosclerotic ulcer or aneurysm.  Chest CTA could be performed for further characterization.     Acute on chronic diastolic CHF- Markedly net negative.  Appears euvolemic Plan: Lasix on hold due to worsening renal function TED hose Strict I's and O's, daily weights   Pleural effusion- IR unable to perform thoracentesis 6/20, 6/21. Medical management. Clinically improving respiratory status.      Atrial fibrillation, chronic (HCC): Heart rate now well controlled in 60-70s diltiazem, metoprolol per cardiology Lisinopril held with AKI Restarted eliquis (renally dosed) in setting of no new bleeding and patient's hgb has been stable >5 days after concern for GI bleed early in admission on heparin gtt.   Recent hx of MSSA bacteremia vertebral osteomyelitis (HCC): continue IV Ancef until 7/12. Follow up with ID outpatient 7/11 repeat Blood culture NGTD   CAD  Myocardial injury  HLD:  trop  258, possibly due to demand ischemia secondary to CHF exacerbation.  Patient does not have chest pain. Crestor, beta blocker, Aspirin 81 discontinued as he's on eliquis  Cardiology following  May need coronary angiography at some point but will defer for now     AKI on Chronic kidney disease, stage 3a (HCC):  Baseline creatinine 1.5-1.9.  Uptrending creatinine level Hold furosemide Administer albumin 12.5 g daily Follow BMP Nephrology following Worsening kidney function may be secondary to interstitial nephritis from antibiotic therapy.  Will need biopsy to prove this diagnosis.  Patient is not a candidate for any invasive procedure at this time.  Will monitor closely and follow-up with nephrology as an outpatient   Normocytic anemia: hgb stable around 8.5-9. S/p 1 unit PRBC 06/18 ABLA w/ rectal bleed   Hypertension Continue to closely monitor vital signs.   Resolved hypokalemia   GERD PPI    Right knee pain -X-ray with chronic arthritic changes.  Recommend frequent mobility     Hypokalemia Secondary to diuretic  therapy.  Supplemented            Subjective: No new complaints.  Weak.  Sitting up in bed  Physical Exam: Vitals:   09/09/22 0005 09/09/22 0432 09/09/22 0804 09/09/22 1426  BP: 125/63 (!) 155/61 (!) 157/79 (!) 144/69  Pulse: 65 64 67 64  Resp: 18 18 18 18   Temp: 97.8 F (36.6 C) 97.8 F (36.6 C) 98.2 F (36.8 C) 98 F (36.7 C)  TempSrc: Oral Oral Oral   SpO2: 94% 94% 95% 93%  Weight:  87.9 kg    Height:       General exam: NAD.  Lying in bed. Awake Respiratory system: Right-sided crackles.  Normal work of breathing.  3 L Cardiovascular system: S1-S2, RRR, no murmurs, no pedal edema Gastrointestinal system: Soft, NT/ND, normal bowel sounds Central nervous system: Alert and oriented. No focal neurological deficits. Extremities: Right lower extremity decreased ROM Skin: No rashes, lesions or ulcers Psychiatry: Judgement and insight appear normal. Mood & affect flattened.   Data Reviewed: Labs reviewed.  Potassium 3.0 There are no new results to review at this time.  Family Communication: Plan of care discussed with patient and his wife at the bedside.  Nurse practitioner from vascular surgery also available at the bedside and discussed with patient's wife in detail.  Patient's wife is in agreement for him to be discharged to a skilled nursing facility for rehab  Disposition: Status is: Inpatient Remains inpatient appropriate because: Awaiting discharge to SNF  Planned Discharge  Destination: Skilled nursing facility    Time spent: 33 minutes  Author: Lucile Shutters, MD 09/09/2022 2:37 PM  For on call review [email or web address].

## 2022-09-09 NOTE — TOC Progression Note (Addendum)
Transition of Care [facility]) - Progression Note    Patient Details  Name: Brett Riley MRN: [id number] Date of Birth: 1935-05-09  Transition of Care [facility]) CM/SW Contact  Truddie Hidden, RN Phone Number: 09/09/2022, 12:21 PM  Clinical Narrative:    Spoke with patient's wife regarding discharge plan.  She is requesting [facility].  She was advised [facility] has declined a bed offer due to cost of care.  She stated she would be willing to privately pay. RNCM advised she would contact admissions to see if patient would be accepted [name] was advised she would have to speak with admissions regarding financials pertaining to facility.  She inquired about [facility] and was advised offer was still pending in the HUB.  RNCM will follow up with Tracey in admissions to determine status of offer.  Per MD patient is likely to discharge tomorrow. [name] advised of CMS guidelines and right to appeal or request another facility once patient has discharge.  She stated it is unfair to send a patient to facility that has one star ratings per [email or web address]  RNCM reiterated guideline is per CMS not the hospital and that patient has multiple bed offers. She was advised authorization is required and still needed to be obtained.    12:39pm Spoke with Kennith Center from [facility]. Referral being reviewed DON.  12:45pm Attempt to reach admissions at [facility]. No answer. Left a message regarding patient's referral and family wanting to private pay.   1:15pm Attempt to  reach admissions at [facility]. No answer. Left a message requesting return call regarding admission.  4:07pm Attempt to reach Tracey at [facility] regarding admission. No answer. Left a message.  Attempt to reach admissions for [facility] no answer. Left a message requesting update for bed status and private pay options.          Expected Discharge Plan: Skilled Nursing Facility Barriers to Discharge: Continued Medical Work up,  English as a second language teacher  Expected Discharge Plan and Services     Post Acute Care Choice: Skilled Nursing Facility Living arrangements for the past 2 months: Single Family Home                                       Social Determinants of Health (SDOH) Interventions SDOH Screenings   Food Insecurity: No Food Insecurity (08/18/2022)  Housing: Low Risk  (08/18/2022)  Transportation Needs: No Transportation Needs (08/18/2022)  Utilities: Not At Risk (08/18/2022)  Depression (PHQ2-9): Low Risk  (06/06/2022)  Financial Resource Strain: Low Risk  (08/29/2020)  Physical Activity: Insufficiently Active (08/29/2020)  Social Connections: Unknown (08/29/2020)  Stress: No Stress Concern Present (08/29/2020)  Tobacco Use: Low Risk  (09/05/2022)    Readmission Risk Interventions     No data to display

## 2022-09-09 NOTE — Telephone Encounter (Signed)
Called patients wife and spoke with her. NP Arlys John has been by to see him and has answered all of their questions.

## 2022-09-09 NOTE — Telephone Encounter (Signed)
Pt wife called stating pt is in hospital and she would like to give an update on him

## 2022-09-10 DIAGNOSIS — J9601 Acute respiratory failure with hypoxia: Secondary | ICD-10-CM | POA: Diagnosis not present

## 2022-09-10 LAB — BASIC METABOLIC PANEL WITH GFR
Anion gap: 12 (ref 5–15)
BUN: 87 mg/dL — ABNORMAL HIGH (ref 8–23)
CO2: 22 mmol/L (ref 22–32)
Calcium: 8.3 mg/dL — ABNORMAL LOW (ref 8.9–10.3)
Chloride: 103 mmol/L (ref 98–111)
Creatinine, Ser: 3.82 mg/dL — ABNORMAL HIGH (ref 0.61–1.24)
GFR, Estimated: 15 mL/min — ABNORMAL LOW
Glucose, Bld: 123 mg/dL — ABNORMAL HIGH (ref 70–99)
Potassium: 3.9 mmol/L (ref 3.5–5.1)
Sodium: 137 mmol/L (ref 135–145)

## 2022-09-10 LAB — PROTEIN / CREATININE RATIO, URINE
Creatinine, Urine: 45 mg/dL
Protein Creatinine Ratio: 2.64 mg/mg{creat} — ABNORMAL HIGH (ref 0.00–0.15)
Total Protein, Urine: 119 mg/dL

## 2022-09-10 NOTE — TOC Progression Note (Addendum)
Transition of Care [facility]) - Progression Note    Patient Details  Name: Brett Riley MRN: [id number] Date of Birth: 1935-09-06  Transition of Care [facility]) CM/SW Contact  Truddie Hidden, RN Phone Number: 09/10/2022, 11:25 AM  Clinical Narrative:    Sherron Monday with Kennith Center, Admissions Coordinator for [facility]. Facility declined patient a bed offer. Attempt to reach the admissions coordinator at [facility]. The coordinator is currently on a call. Her direct number was provided.   11:36am Attempt to reach admissions for [facility] @ [phone]. No answer. Left a message.   1:30pm Spoke with [place] from [facility]. Patient referral resent and reviewed. Patient can be accepted tomorrow pending auth approval.Patient is accepted. [employer] contacted by [facility] assistant regarding status of auth, awaiting response.   3:30pm  Patient wife contacted. She was advised patient. She was advised Eliquis   has to be filled prior to admission. She was also advised [name] has to be approved and is pending. [name] was advised of potential discharge tomorrow pending auth.    Expected Discharge Plan: Skilled Nursing Facility Barriers to Discharge: Continued Medical Work up, English as a second language teacher  Expected Discharge Plan and Services     Post Acute Care Choice: Skilled Nursing Facility Living arrangements for the past 2 months: Single Family Home                                       Social Determinants of Health (SDOH) Interventions SDOH Screenings   Food Insecurity: No Food Insecurity (08/18/2022)  Housing: Low Risk  (08/18/2022)  Transportation Needs: No Transportation Needs (08/18/2022)  Utilities: Not At Risk (08/18/2022)  Depression (PHQ2-9): Low Risk  (06/06/2022)  Financial Resource Strain: Low Risk  (08/29/2020)  Physical Activity: Insufficiently Active (08/29/2020)  Social Connections: Unknown (08/29/2020)  Stress: No Stress Concern Present (08/29/2020)  Tobacco Use: Low Risk  (09/05/2022)     Readmission Risk Interventions     No data to display

## 2022-09-10 NOTE — Progress Notes (Signed)
[facility]  ROUNDING NOTE   Subjective:   Patient sitting up in bed Family member, Terri, at bedside  Room air TED hose in place  Edema improving  UOP 1.45L Creatinine 3.82  Objective:  Vital signs in last 24 hours:  Temp:  [97.6 F (36.4 C)-98.1 F (36.7 C)] 97.8 F (36.6 C) (07/09 1259) Pulse Rate:  [63-69] 67 (07/09 1259) Resp:  [18-20] 20 (07/09 1259) BP: (123-150)/(66-70) 123/66 (07/09 1259) SpO2:  [93 %-99 %] 96 % (07/09 1259) Weight:  [88.3 kg] 88.3 kg (07/09 0441)  Weight change: 0.4 kg Filed Weights   09/08/22 0440 09/09/22 0432 09/10/22 0441  Weight: 88 kg 87.9 kg 88.3 kg    Intake/Output: I/O last 3 completed shifts: In: 960 [P.O.:960] Out: 2050 [Urine:2050]   Intake/Output this shift:  Total I/O In: 240 [P.O.:240] Out: 350 [Urine:350]  Physical Exam: General: NAD, laying in bed  Head: Normocephalic, atraumatic. Moist oral mucosal membranes  Eyes: Anicteric  Lungs:  Diminished, [place] O2  Heart: Regular rate and rhythm  Abdomen:  Soft, nontender  Extremities:  + peripheral edema.  TED hose  Neurologic: Alert and oriented, moving all four extremities  Skin: No lesions  Access: none    Basic Metabolic Panel: Recent Labs  Lab 09/05/22 0355 09/06/22 1005 09/07/22 0900 09/09/22 1000 09/10/22 0928  NA 135 139 140 138 137  K 3.0* 3.5 3.4* 3.0* 3.9  CL 101 105 105 104 103  CO2 23 25 24 22 22   GLUCOSE 98 138* 90 122* 123*  BUN 95* 88* 85* 91* 87*  CREATININE 3.09* 3.10* 3.22* 3.56* 3.82*  CALCIUM 8.2* 8.6* 8.6* 8.5* 8.3*     Liver Function Tests: No results for input(s): "AST", "ALT", "ALKPHOS", "BILITOT", "PROT", "ALBUMIN" in the last 168 hours.  No results for input(s): "LIPASE", "AMYLASE" in the last 168 hours. No results for input(s): "AMMONIA" in the last 168 hours.  CBC: Recent Labs  Lab 09/07/22 0900  WBC 6.6  HGB 8.3*  HCT 25.9*  MCV 92.2  PLT 148*     Cardiac Enzymes: No results for input(s):  "CKTOTAL", "CKMB", "CKMBINDEX", "TROPONINI" in the last 168 hours.   BNP: Invalid input(s): "POCBNP"  CBG: No results for input(s): "GLUCAP" in the last 168 hours.   Microbiology: Results for orders placed or performed during the hospital encounter of 08/18/22  Blood culture (routine x 2)     Status: None   Collection Time: 08/18/22 10:45 AM   Specimen: BLOOD LEFT ARM  Result Value Ref Range Status   Specimen Description BLOOD LEFT ARM  Final   Special Requests   Final    BOTTLES DRAWN AEROBIC AND ANAEROBIC Blood Culture adequate volume   Culture   Final    NO GROWTH 5 DAYS Performed at [facility], [address], [place], [place] [address]    Report Status 08/23/2022 FINAL  Final  Resp Panel by RT-PCR (Flu A&B, Covid) Anterior Nasal Swab     Status: None   Collection Time: 08/18/22 10:46 AM   Specimen: Anterior Nasal Swab  Result Value Ref Range Status   SARS Coronavirus 2 by RT PCR NEGATIVE NEGATIVE Final    Comment: (NOTE) SARS-CoV-2 target nucleic acids are NOT DETECTED.  The SARS-CoV-2 RNA is generally detectable in upper respiratory specimens during the acute phase of infection. The lowest concentration of SARS-CoV-2 viral copies this assay can detect is 138 copies/mL. A negative result does not preclude SARS-Cov-2 infection and should not be  used as the sole basis for treatment or other patient management decisions. A negative result may occur with  improper specimen collection/handling, submission of specimen other than nasopharyngeal swab, presence of viral mutation(s) within the areas targeted by this assay, and inadequate number of viral copies(<138 copies/mL). A negative result must be combined with clinical observations, patient history, and epidemiological information. The expected result is Negative.  Fact Sheet for Patients:  BloggerCourse.com  Fact Sheet for Healthcare Providers:   SeriousBroker.it  This test is no t yet approved or cleared by the [place] FDA and  has been authorized for detection and/or diagnosis of SARS-CoV-2 by FDA under an Emergency Use Authorization (EUA). This EUA will remain  in effect (meaning this test can be used) for the duration of the COVID-19 declaration under Section 564(b)(1) of the Act, 21 U.S.C.section 360bbb-3(b)(1), unless the authorization is terminated  or revoked sooner.       Influenza A by PCR NEGATIVE NEGATIVE Final   Influenza B by PCR NEGATIVE NEGATIVE Final    Comment: (NOTE) The Xpert Xpress SARS-CoV-2/FLU/RSV plus assay is intended as an aid in the diagnosis of influenza from Nasopharyngeal swab specimens and should not be used as a sole basis for treatment. Nasal washings and aspirates are unacceptable for Xpert Xpress SARS-CoV-2/FLU/RSV testing.  Fact Sheet for Patients: BloggerCourse.com  Fact Sheet for Healthcare Providers: SeriousBroker.it  This test is not yet approved or cleared by the [place] FDA and has been authorized for detection and/or diagnosis of SARS-CoV-2 by FDA under an Emergency Use Authorization (EUA). This EUA will remain in effect (meaning this test can be used) for the duration of the COVID-19 declaration under Section 564(b)(1) of the Act, 21 U.S.C. section 360bbb-3(b)(1), unless the authorization is terminated or revoked.  Performed at [facility], [address] [place], [place] [address]   Culture, blood (Routine X 2) w Reflex to ID Panel     Status: None   Collection Time: 08/18/22  9:19 PM   Specimen: BLOOD LEFT ARM  Result Value Ref Range Status   Specimen Description BLOOD LEFT ARM  Final   Special Requests   Final    BOTTLES DRAWN AEROBIC AND ANAEROBIC Blood Culture adequate volume   Culture   Final    NO GROWTH 5 DAYS Performed at [facility], [address] [place], [place] [address]    Report Status 08/23/2022 FINAL  Final  Respiratory (~20 pathogens) panel by PCR     Status: None   Collection Time: 08/21/22  1:50 PM   Specimen: Nasopharyngeal Swab; Respiratory  Result Value Ref Range Status   Adenovirus NOT DETECTED NOT DETECTED Final   Coronavirus 229E NOT DETECTED NOT DETECTED Final    Comment: (NOTE) The Coronavirus on the Respiratory Panel, DOES NOT test for the novel  Coronavirus (2019 nCoV)    Coronavirus HKU1 NOT DETECTED NOT DETECTED Final   Coronavirus NL63 NOT DETECTED NOT DETECTED Final   Coronavirus OC43 NOT DETECTED NOT DETECTED Final   Metapneumovirus NOT DETECTED NOT DETECTED Final   Rhinovirus / Enterovirus NOT DETECTED NOT DETECTED Final   Influenza A NOT DETECTED NOT DETECTED Final   Influenza B NOT DETECTED NOT DETECTED Final   Parainfluenza Virus 1 NOT DETECTED NOT DETECTED Final   Parainfluenza Virus 2 NOT DETECTED NOT DETECTED Final   Parainfluenza Virus 3 NOT DETECTED NOT DETECTED Final   Parainfluenza Virus 4 NOT DETECTED NOT DETECTED Final   Respiratory Syncytial Virus NOT DETECTED  NOT DETECTED Final   Bordetella pertussis NOT DETECTED NOT DETECTED Final   Bordetella Parapertussis NOT DETECTED NOT DETECTED Final   Chlamydophila pneumoniae NOT DETECTED NOT DETECTED Final   Mycoplasma pneumoniae NOT DETECTED NOT DETECTED Final    Comment: Performed at [facility], [address], [place], [place] [address]  Culture, blood (Routine X 2) w Reflex to ID Panel     Status: None   Collection Time: 08/21/22  8:58 PM   Specimen: BLOOD  Result Value Ref Range Status   Specimen Description BLOOD LEFT WRIST  Final   Special Requests   Final    BOTTLES DRAWN AEROBIC AND ANAEROBIC Blood Culture adequate volume   Culture   Final    NO GROWTH 5 DAYS Performed at [facility], [address] [place], [place] [address]    Report Status 08/26/2022 FINAL  Final  Culture, blood (Routine X 2) w Reflex  to ID Panel     Status: None   Collection Time: 08/21/22 11:01 PM   Specimen: BLOOD  Result Value Ref Range Status   Specimen Description BLOOD BLOOD LEFT ARM  Final   Special Requests   Final    BOTTLES DRAWN AEROBIC AND ANAEROBIC Blood Culture adequate volume   Culture   Final    NO GROWTH 5 DAYS Performed at [facility], [address], [place], [place] [address]    Report Status 08/26/2022 FINAL  Final  Expectorated Sputum Assessment w Gram Stain, Rflx to Resp Cult     Status: None   Collection Time: 08/22/22 11:22 AM   Specimen: Sputum  Result Value Ref Range Status   Specimen Description SPUTUM  Final   Special Requests EXPSU  Final   Sputum evaluation   Final    THIS SPECIMEN IS ACCEPTABLE FOR SPUTUM CULTURE Performed at [facility], [address] [place], [place] [address]    Report Status 08/22/2022 FINAL  Final  Culture, Respiratory w Gram Stain     Status: None   Collection Time: 08/22/22 11:22 AM   Specimen: SPU  Result Value Ref Range Status   Specimen Description   Final    SPUTUM Performed at [facility], [address], [place], [place] [address]    Special Requests   Final    EXPSU Reflexed from [id number] Performed at [facility], [address] [place], [place] [address]    Gram Stain   Final    ABUNDANT SQUAMOUS EPITHELIAL CELLS PRESENT FEW WBC PRESENT, PREDOMINANTLY PMN ABUNDANT GRAM VARIABLE ROD ABUNDANT GRAM POSITIVE COCCI IN PAIRS    Culture   Final    Normal respiratory flora-no Staph aureus or Pseudomonas seen Performed at [facility], [address] [place], [place] [address]    Report Status 08/25/2022 FINAL  Final    Coagulation Studies: No results for input(s): "LABPROT", "INR" in the last 72 hours.  Urinalysis: No results for input(s): "COLORURINE", "LABSPEC", "PHURINE", "GLUCOSEU", "HGBUR", "BILIRUBINUR", "KETONESUR", "PROTEINUR", "UROBILINOGEN", "NITRITE", "LEUKOCYTESUR" in the last  72 hours.  Invalid input(s): "APPERANCEUR"     Imaging: No results found.   Medications:    ceFAZolin 2 g (09/10/22 0926)    apixaban  2.5 mg Oral BID   Chlorhexidine Gluconate Cloth  6 each Topical Daily   cyanocobalamin  1,000 mcg Oral Daily   diltiazem  360 mg Oral Daily   feeding supplement  237 mL Oral BID BM   metoprolol succinate  100  mg Oral Daily   multivitamins with iron  1 tablet Oral Daily   pantoprazole  40 mg Oral Daily   polyethylene glycol  17 g Oral Daily   rosuvastatin  10 mg Oral Daily   senna  1 tablet Oral Daily   acetaminophen, albuterol, dextromethorphan-guaiFENesin, melatonin, ondansetron (ZOFRAN) IV, simethicone, sodium chloride, traMADol  Assessment/ Plan:  Brett Riley is a 87 y.o.  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  Acute kidney injury with proteinuria and hematuria: Baseline creatinine of 1.15 with normal GFR on 5/29. ANA positive but double-stranded DNA, anti-GBM, c-ANCA p-ANCA, MPO, PR-3, ENA negative.  Complements normal.   Differential diagnosis of AKI is extensive. Patient had pleural effusion/atypical infection/pulmonary edema noted on CT on June 19.  Extensive atherosclerosis and coronary atherosclerosis was noted. No IV contrast exposure recently. Blood pressure has been in the 140s to 150s.  Baseline creatinine of 0.98 from 07/29/2022. It has subsequently continued to increase and is up to 2.97 today. 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 Renal ultrasound on 08/23/2022 negative for obstruction.  AKI likely secondary to ATN.  Other differential includes acute interstitial nephritis. - holding lisinopril and furosemide -Creatinine stable, 3.82.  Remains above stated baseline - 1.45L urine recorded in previous 24 hrs -Has received albumin during this admission - Will schedule follow up in our office at discharge   2.  Acute on chronic diastolic heart failure. With LE edema  Ejection fraction 55 to 60% with grade 1 diastolic dysfunction noted.  -Stabilized  3.  Anemia with renal failure: PRBC transfusion on 6/18.   Lab Results  Component Value Date   HGB 8.3 (L) 09/07/2022  Hemoglobin not at goal  Will monitor for now.    LOS: 23   7/9/20241:00 PM

## 2022-09-10 NOTE — Progress Notes (Signed)
Progress Note   Patient: Brett Riley [name] DOB: 03/14/1935 DOA: 08/18/2022     23 DOS: the patient was seen and examined on 09/10/2022   Brief hospital course: 87 y.o. male with medical history significant of chronic dCHF, HTN, HLD, CAD, CKD-3a, anemia, chronic A fib on Eliquis, who presents 08/18/2022 to ED from rehab, chief complaint SOB.  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 Net IO Since Admission: -5,549.14 mL [08/20/22 1608]. Heparin stopped d/t rectal bleeding overnight.  06/19: euvolemic per cardio, d/c lasix, O2 requirement still high, CT chest reviewed, ?edema/atypical infection, given clinically worsening respiratory status have asked pulmonary and ID to consult. Have d/c amiodarone and ordered steroids in case amio toxicity.  06/20: ID and pulmonary assisting with the management.   6/22-6/25: patient feels clinically improved. Stable with oxygen via [place] and weaned down to 2-3L. Patient requiring judicial adjustments to balance between hypervolemia and renal injury with diuresis. Unable to pull fluid from lungs with thoracentesis so medical management is best option. His IV Abx to treat his vertebral osteomyelitis from previous admission continue unchanged and managed by ID.    6/28: Bed offered at [facility].  Authorization pending 7/1: No status changes.  Per patient's daughter he has been seeming more depressed.  I have offered outpatient referral to psychiatry.   7/2: Insurance authorization for LTAC continues to be pending   7/3 : Peer-to-peer  review done.  Not a candidate for LTAC.  Approved for skilled nursing facility patient  7/9 : Medically stable and ready for discharge to skilled nursing facility   Assessment and Plan:  Principal Problem:   Acute respiratory failure with hypoxia (HCC) Active Problems:   Acute on chronic heart failure with preserved ejection fraction (HFpEF) (HCC)   MSSA bacteremia   Vertebral osteomyelitis (HCC)   CAD (coronary artery disease)   Myocardial injury   Hypertension   Hyperlipidemia   Chronic kidney disease, stage 3a (HCC)   Normocytic anemia   Atrial fibrillation, chronic (HCC)   Paroxysmal atrial fibrillation (HCC)   HCAP (healthcare-associated pneumonia)   Sepsis (HCC)   Pleural effusion   Amiodarone pulmonary toxicity   Acute on chronic HFrEF (heart failure with reduced ejection fraction) (HCC)   Pleural effusion on right   Status post thoracentesis   Heart failure with preserved ejection fraction (HCC)   Stage 3 chronic kidney disease (HCC)    Acute on chronic respiratory failure with hypoxia due to acute on chronic CHF  possible pneumonia, bilateral small pleural effusion- as seen on chest CT Not on oxygen supplementation at baseline Has been weaned off oxygen completely and is on room air - wean as tolerated -Completed course of azithromycin. - Continue Ancef as written for his osteomyelitis.  Last dose 7/12 - incentive spirometer q1hr - Off steroids      Repeat CT scan of the chest 07/06 shows 1. Interval improvement of bilateral lung opacities with mild residual ground-glass and scattered consolidations, likely improving multifocal pneumonia. 2. Small bilateral pleural effusions, decreased in size when compared with the prior exam. 3. Decreased size of mediastinal  lymph nodes, consistent with resolving reactive adenopathy. 4. Coronary artery calcifications and aortic Atherosclerosis (ICD10-I70.0). 5. Unchanged contour abnormality of the distal ascending  thoracic aorta, possibly a chronic penetrating atherosclerotic ulcer or aneurysm. Chest CTA could be performed for further characterization.     Acute on chronic diastolic CHF- Markedly net negative.  Appears euvolemic Plan: Lasix on hold due to worsening renal function TED hose Strict I's and O's, daily weights   Pleural effusion- IR unable to perform thoracentesis 6/20, 6/21. Medical management. Clinically improving respiratory status.      Atrial fibrillation, chronic (HCC): Heart rate now well controlled in 60-70s diltiazem, metoprolol per cardiology Lisinopril held with AKI Restarted eliquis (renally dosed) in setting of no new bleeding and patient's hgb has been stable >5 days after concern for GI bleed early in admission on heparin gtt.   Recent hx of MSSA bacteremia vertebral osteomyelitis (HCC): continue IV Ancef until 7/12. Follow up with ID outpatient 7/11 repeat Blood culture NGTD   CAD  Myocardial injury  HLD:  trop  258, possibly due to demand ischemia secondary to CHF exacerbation.  Patient does not have chest pain. Crestor, beta blocker, Aspirin 81 discontinued as he's on eliquis  Cardiology following  May need coronary angiography at some point but will defer for now     AKI on Chronic kidney disease, stage 3a (HCC):  Baseline creatinine 1.5-1.9.  Uptrending creatinine level and has peaked at 3.82 Hold furosemide Administer albumin 12.5 g daily Follow BMP Nephrology following Appreciate nephrology input.  Patient noted to have ANA positive but double-stranded DNA, anti-GBM, c-ANCA, p-ANCA, MPO, PR-3, ANA negative Worsening kidney function may be secondary to interstitial nephritis from antibiotic therapy.  Will need biopsy to prove this diagnosis.  Patient is not a candidate for any invasive procedure at this time, he and his wife are in agreement and declined a biopsy at this time.   Continues to have adequate urine output Will monitor closely and follow-up  with nephrology as an outpatient   Normocytic anemia:  hgb stable around 8.5-9. S/p 1 unit PRBC 06/18 ABLA w/ rectal bleed   Hypertension Continue to closely monitor vital signs.   Resolved hypokalemia   GERD PPI    Right knee pain -X-ray with chronic arthritic changes.  Recommend frequent mobility     Hypokalemia Secondary to diuretic therapy.  Supplemented              Subjective: No new complaints  Physical Exam: Vitals:   09/09/22 2347 09/10/22 0441 09/10/22 0734 09/10/22 1259  BP: (!) 150/69 (!) 146/70 129/66 123/66  Pulse: 69 66 63 67  Resp: 18 18 20 20   Temp: 98 F (36.7 C) 97.9 F (36.6 C) 97.6 F (36.4 C) 97.8 F (36.6 C)  TempSrc: Oral Oral    SpO2: 93% 93% 95% 96%  Weight:  88.3 kg    Height:       General exam: NAD.  Lying in bed. Awake Respiratory system: Right-sided crackles.  Normal work of breathing.  Cardiovascular system: S1-S2, RRR, no murmurs, no pedal edema Gastrointestinal system: Soft, NT/ND, normal bowel sounds Central nervous system: Alert and oriented. No focal neurological deficits. Extremities: Right lower extremity decreased ROM Skin: No rashes, lesions or ulcers Psychiatry: Judgement and insight appear normal. Mood & affect flattened.     Data Reviewed: Serum creatinine 3.82 There are no new results to review at this time.  Family Communication: Had extensive discussion with patient and his wife at the  bedside.  They understand he is ready to go to skilled nursing facility for rehab but his wife will want him to go to a skilled nursing facility of her choice and is willing to pay privately.  TOC involved  Disposition: Status is: Inpatient Remains inpatient appropriate because: Awaiting discharge to skilled nursing facility  Planned Discharge Destination: Skilled nursing facility    Time spent: 34 minutes  Author: Lucile Shutters, MD 09/10/2022 1:22 PM  For on call review [email or web address].

## 2022-09-10 NOTE — Progress Notes (Addendum)
Physical Therapy Treatment Patient Details Name: Brett Riley MRN: [id number] DOB: 25-Nov-1935 Today's Date: 09/10/2022   History of Present Illness Brett Riley is a 87 y/o M admitted with respiratory failure; other PMH includes MI, CHF, a-fib, HTN, anemia. Was at SNF doing rehab prior to admission; was previously admitted 5/18-6/7.    PT Comments  Pt is in bed, family and MD present. Pt denies any pain at rest. PT/OT co-treat to maximize clinician and patient safety. Pt performed supine>sit c/ maxA+2, able to initiate BLE to EOB and UE to handrail. Pt performed lateral scoot transfer EOB>chair c/ maxA+2 and cueing for anterior lean to help with weight shifting. Pt performed sitting balance EOB c/ feet and BUE supported, posterior lean corrected with cueing until fatigue sets in. Pt left in chair, alarm set, needs within reach, family in room. Pt would benefit from skilled PT interventions to improve mobility, functional capacity, and maximize return to PLOF.     Assistance Recommended at Discharge Frequent or constant Supervision/Assistance  If plan is discharge home, recommend the following:  Can travel by private vehicle    Assistance with cooking/housework;Assist for transportation;Two people to help with walking and/or transfers;Two people to help with bathing/dressing/bathroom;Help with stairs or ramp for entrance;Direct supervision/assist for medications management   No  Equipment Recommendations  Other (comment) (TBD next venue of care)    Recommendations for Other Services       Precautions / Restrictions Precautions Precautions: Fall Restrictions Weight Bearing Restrictions: No     Mobility  Bed Mobility Overal bed mobility: Needs Assistance Bed Mobility: Supine to Sit     Supine to sit: Max assist, +2 for physical assistance     General bed mobility comments: pt able to initiate mvmt of BLE to EOB, and UE to bed rail but needs maxA+2 to complete     Transfers Overall transfer level: Needs assistance   Transfers: Bed to chair/wheelchair/BSC            Lateral/Scoot Transfers: Max assist, +2 physical assistance General transfer comment: deferred    Ambulation/Gait                   Stairs             Wheelchair Mobility     Tilt Bed    Modified Rankin (Stroke Patients Only)       Balance Overall balance assessment: Needs assistance Sitting-balance support: Feet supported, Bilateral upper extremity supported Sitting balance-Leahy Scale: Fair Sitting balance - Comments: mild posterior lean that improves with cueing, when bed is elevated and feet less supported on floor posterior lean increases, lean worsens with fatigue as well, BUE support for balance Postural control: Posterior lean                                  Cognition Arousal/Alertness: Awake/alert Behavior During Therapy: Flat affect Overall Cognitive Status: Within Functional Limits for tasks assessed                                 General Comments: HOH, increased time needed to follow commands        Exercises      General Comments        Pertinent Vitals/Pain Pain Assessment Pain Assessment: No/denies pain    Home Living  Prior Function            PT Goals (current goals can now be found in the care plan section) Progress towards PT goals: Progressing toward goals    Frequency    Min 1X/weekPt treatment frequency updated due to initiation of patient-centered delivery of care model.        PT Plan Current plan remains appropriate    Co-evaluation PT/OT/SLP Co-Evaluation/Treatment: Yes Reason for Co-Treatment: Necessary to address cognition/behavior during functional activity;For patient/therapist safety;To address functional/ADL transfers PT goals addressed during session: Mobility/safety with mobility;Balance OT goals addressed during  session: ADL's and self-care;Proper use of Adaptive equipment and DME      AM-PAC PT "6 Clicks" Mobility   Outcome Measure  Help needed turning from your back to your side while in a flat bed without using bedrails?: A Lot Help needed moving from lying on your back to sitting on the side of a flat bed without using bedrails?: A Lot Help needed moving to and from a bed to a chair (including a wheelchair)?: A Lot Help needed standing up from a chair using your arms (e.g., wheelchair or bedside chair)?: Total Help needed to walk in hospital room?: Total Help needed climbing 3-5 steps with a railing? : Total 6 Click Score: 9    End of Session Equipment Utilized During Treatment: Gait belt Activity Tolerance: Patient limited by fatigue Patient left: with chair alarm set;in chair;with call bell/phone within reach;with family/visitor present Nurse Communication: Mobility status PT Visit Diagnosis: Muscle weakness (generalized) (M62.81);Difficulty in walking, not elsewhere classified (R26.2);Unsteadiness on feet (R26.81);Other abnormalities of gait and mobility (R26.89)     Time: 1610-9604 PT Time Calculation (min) (ACUTE ONLY): 17 min  Charges:    $Therapeutic Activity: 8-22 mins PT General Charges $$ ACUTE PT VISIT: 1 Visit                     Lala Lund, PT, SPT  11:34 AM,09/10/22

## 2022-09-10 NOTE — Telephone Encounter (Signed)
Per discussion with Dr Lorin Picket- she talked with patients wife last night. Called wife to apologize for delayed response and to confirm nothing further needed. Wife thanked me for our calls. Patient still awaiting discharge to SNF

## 2022-09-10 NOTE — Evaluation (Signed)
Occupational Therapy Re- Evaluation Patient Details Name: Brett Riley MRN: [id number] DOB: [birth year] Today's Date: 09/10/2022   History of Present Illness Brett Riley is a 87 y/o M admitted with respiratory failure; other PMH includes MI, CHF, a-fib, HTN, anemia. Was at SNF doing rehab prior to admission; was previously admitted 5/18-6/7.   Clinical Impression   Pt seen for skilled co-treatment with PT with pt being agreeable to OT intervention. Pt needing Max A of 2 for bed mobility but putting forth good effort. Static sitting balance on EOB with min guard- min A and posterior bias noted this session but pt remains at midline. Max A of 2 for lateral scoot transfer from bed >recliner chair with hoyer sling underneath for NSG to assist with transfer back safely. Pt is making slow progress towards goals. Goals have been downgraded but pt continues to benefit from OT intervention.      Recommendations for follow up therapy are one component of a multi-disciplinary discharge planning process, led by the attending physician.  Recommendations may be updated based on patient status, additional functional criteria and insurance authorization.   Assistance Recommended at Discharge Frequent or constant Supervision/Assistance  Patient can return home with the following Two people to help with walking and/or transfers;A lot of help with bathing/dressing/bathroom;Assistance with cooking/housework;Direct supervision/assist for medications management;Direct supervision/assist for financial management;Assist for transportation;Help with stairs or ramp for entrance       Equipment Recommendations  Other (comment) (defer to next venue of care)       Precautions / Restrictions Precautions Precautions: Fall Restrictions Weight Bearing Restrictions: No      Mobility Bed Mobility Overal bed mobility: Needs Assistance Bed Mobility: Supine to Sit     Supine to sit: Max assist, +2 for physical  assistance          Transfers Overall transfer level: Needs assistance Equipment used: 2 person hand held assist Transfers: Bed to chair/wheelchair/BSC            Lateral/Scoot Transfers: Max assist, +2 physical assistance        Balance Overall balance assessment: Needs assistance Sitting-balance support: Feet supported, Bilateral upper extremity supported Sitting balance-Leahy Scale: Fair   Postural control: Posterior lean                                 ADL either performed or assessed with clinical judgement      Vision Patient Visual Report: No change from baseline              Pertinent Vitals/Pain Pain Assessment Pain Assessment: No/denies pain        Extremity/Trunk Assessment Upper Extremity Assessment Upper Extremity Assessment: Generalized weakness   Lower Extremity Assessment Lower Extremity Assessment: Generalized weakness          Cognition Arousal/Alertness: Awake/alert Behavior During Therapy: Flat affect Overall Cognitive Status: Within Functional Limits for tasks assessed                                 General Comments: HOH, increased time needed to follow commands                                    OT Goals(Current goals can be found in the care plan section) Acute Rehab OT Goals OT  Goal Formulation: With patient Time For Goal Achievement: 10/08/22 Potential to Achieve Goals: Fair ADL Goals Pt Will Perform Grooming: sitting;with supervision Pt Will Perform Lower Body Dressing: sitting/lateral leans;with adaptive equipment;with mod assist Pt Will Transfer to Toilet: with mod assist;bedside commode;squat pivot transfer Pt Will Perform Toileting - Clothing Manipulation and hygiene: with mod assist;sitting/lateral leans  OT Frequency: Min 1X/week    Co-evaluation   Reason for Co-Treatment: Necessary to address cognition/behavior during functional activity;For patient/therapist safety;To  address functional/ADL transfers PT goals addressed during session: Mobility/safety with mobility;Balance OT goals addressed during session: ADL's and self-care;Proper use of Adaptive equipment and DME      AM-PAC OT "6 Clicks" Daily Activity     Outcome Measure Help from another person eating meals?: A Little Help from another person taking care of personal grooming?: A Little Help from another person toileting, which includes using toliet, bedpan, or urinal?: Total Help from another person bathing (including washing, rinsing, drying)?: Total Help from another person to put on and taking off regular upper body clothing?: A Lot Help from another person to put on and taking off regular lower body clothing?: Total 6 Click Score: 11   End of Session Equipment Utilized During Treatment: Gait belt Nurse Communication: Mobility status (hoyer sling under pt for NSG to assist pt back with lift for safety)  Activity Tolerance: Patient tolerated treatment well Patient left: with call bell/phone within reach;in chair;with chair alarm set  OT Visit Diagnosis: Unsteadiness on feet (R26.81);Muscle weakness (generalized) (M62.81)                Time: 1027-1050 OT Time Calculation (min): 23 min Charges:  OT General Charges $OT Visit: 1 Visit OT Evaluation $OT Re-eval: 1 Re-eval OT Treatments $Therapeutic Activity: 8-22 mins  Jackquline Denmark, MS, OTR/L , CBIS ascom [phone]  09/10/22, 1:18 PM

## 2022-09-11 DIAGNOSIS — J9601 Acute respiratory failure with hypoxia: Secondary | ICD-10-CM | POA: Diagnosis not present

## 2022-09-11 MED ORDER — SODIUM CHLORIDE 0.9 % IV SOLN: INTRAVENOUS | Status: DC | PRN

## 2022-09-11 NOTE — Progress Notes (Signed)
PROGRESS NOTE    Brett Riley  [name] DOB: 1935-10-15 DOA: 08/18/2022 PCP: Dale , MD    Brief Narrative:  87 y.o. male with medical history significant of chronic dCHF, HTN, HLD, CAD, CKD-3a, anemia, chronic A fib on Eliquis, who presents 08/18/2022 to ED from rehab, chief complaint SOB.  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 Net IO Since Admission: -5,549.14 mL [08/20/22 1608]. Heparin stopped d/t rectal bleeding overnight.  06/19: euvolemic per cardio, d/c lasix, O2 requirement still high, CT chest reviewed, ?edema/atypical infection, given clinically worsening respiratory status have asked pulmonary and ID to consult. Have d/c amiodarone and ordered steroids in case amio toxicity.  06/20: ID and pulmonary assisting with the management.   6/22-6/25: patient feels clinically improved. Stable with oxygen via [place] and weaned down to 2-3L. Patient requiring judicial adjustments to balance between hypervolemia and renal injury with diuresis. Unable to pull fluid from lungs with thoracentesis so medical management is best option. His IV Abx to treat his vertebral osteomyelitis from previous admission continue unchanged and managed by ID.    6/28: Bed offered at [facility]  Authorization pending 7/1: No status changes.  Per patient's daughter he has been seeming more depressed.  I have offered outpatient referral to psychiatry.   7/2: Insurance authorization for LTAC continues to be pending   7/3 : Peer-to-peer review done.  Not a candidate for LTAC.   Approved for skilled nursing facility patient   7/9 : Medically stable and ready for discharge to skilled nursing facility 7/10: No acute events.  Pending insurance authorization   Assessment & Plan:   Principal Problem:   Acute respiratory failure with hypoxia (HCC) Active Problems:   Acute on chronic heart failure with preserved ejection fraction (HFpEF) (HCC)   MSSA bacteremia   Vertebral osteomyelitis (HCC)   CAD (coronary artery disease)   Myocardial injury   Hypertension   Hyperlipidemia   Chronic kidney disease, stage 3a (HCC)   Normocytic anemia   Atrial fibrillation, chronic (HCC)   Paroxysmal atrial fibrillation (HCC)   HCAP (healthcare-associated pneumonia)   Sepsis (HCC)   Pleural effusion   Amiodarone pulmonary toxicity   Acute on chronic HFrEF (heart failure with reduced ejection fraction) (HCC)   Pleural effusion on right   Status post thoracentesis   Heart failure with preserved ejection fraction (HCC)   Stage 3 chronic kidney disease (HCC)  Acute on chronic respiratory failure with hypoxia due to acute on chronic CHF  possible pneumonia, bilateral small pleural effusion- as seen on chest CT Not on oxygen supplementation at baseline Has been weaned off oxygen completely and is on room air - wean as tolerated -Completed course of azithromycin. - Continue Ancef as written for his osteomyelitis.  Last dose 7/12 - incentive spirometer q1hr - Off steroids   Acute on chronic diastolic CHF- Markedly net negative over course of admission.  Appears euvolemic Plan: Lasix on hold due to worsening renal function TED hose Strict I's and O's, daily weights   Pleural effusion- IR unable to perform thoracentesis 6/20, 6/21. Medical management. Clinically improving respiratory  status.      Atrial fibrillation, chronic (HCC): Heart rate now well controlled in 60-70s diltiazem, metoprolol per cardiology Lisinopril held with AKI Restarted eliquis (renally dosed) in  setting of no new bleeding and patient's hgb has been stable >5 days after concern for GI bleed early in admission on heparin gtt.   Recent hx of MSSA bacteremia vertebral osteomyelitis (HCC): continue IV Ancef until 7/12. Follow up with ID outpatient 7/11 repeat Blood culture NGTD   CAD  Myocardial injury  HLD:  trop  258, possibly due to demand ischemia secondary to CHF exacerbation.  Patient does not have chest pain. Crestor, beta blocker, Aspirin 81 discontinued as he's on eliquis  Cardiology following  May need coronary angiography at some point but will defer for now     AKI on Chronic kidney disease, stage 3a (HCC):  Baseline creatinine 1.5-1.9.  Uptrending creatinine level and has peaked at 3.82 Plan: Discussed with nephrology.  Lasix on hold.  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  Offered kidney biopsy.  Poor candidate at this time.  Deferred outpatient evaluation.   Normocytic anemia:  hgb stable around 8.5-9. S/p 1 unit PRBC 06/18    Hypertension Continue to closely monitor vital signs.   Resolved hypokalemia   GERD PPI    Right knee pain -X-ray with chronic arthritic changes.  Recommend frequent mobility     Hypokalemia Secondary to diuretic therapy.  Supplemented   DVT prophylaxis: Eliquis Code Status: DNR Family Communication: Spouse at bedside 7/10 Disposition Plan: Status is: Inpatient Remains inpatient appropriate because: Pending insurance authorization.  Unsafe discharge plan.   Level of care: Progressive  Consultants:  Nephrology  Procedures:  None  Antimicrobials: Cefazolin   Subjective: Seen and examined.  Resting in bed.  Eating more.  Objective: Vitals:   09/11/22 0047 09/11/22 0333 09/11/22 0832 09/11/22 1149  BP: (!) 150/76 137/73 (!) 147/68 (!) 146/66  Pulse: 68 72 74 76  Resp: 18 18 15 15   Temp: 97.8 F (36.6 C) 98.3 F (36.8 C) 98.2 F (36.8 C) 98.1 F (36.7 C)  TempSrc: Oral Oral     SpO2: 92% 91% 93% (!) 89%  Weight:  88.1 kg    Height:        Intake/Output Summary (Last 24 hours) at 09/11/2022 1530 Last data filed at 09/11/2022 0711 Gross per 24 hour  Intake 340 ml  Output 500 ml  Net -160 ml   Filed Weights   09/09/22 0432 09/10/22 0441 09/11/22 0333  Weight: 87.9 kg 88.3 kg 88.1 kg    Examination:  General exam: Appears calm and comfortable  Respiratory system: Clear to auscultation. Respiratory effort normal. Cardiovascular system: S1-S2, RRR, no murmurs, no pedal edema Gastrointestinal system: Soft, NT/ND, normal bowel sounds Central nervous system: Alert and oriented. No focal neurological deficits. Extremities: Decreased power bilateral lower extremities Skin: No rashes, lesions or ulcers Psychiatry: Judgement and insight appear normal. Mood & affect appropriate.     Data Reviewed: I have personally reviewed following labs and imaging studies  CBC: Recent Labs  Lab 09/07/22 0900  WBC 6.6  HGB 8.3*  HCT 25.9*  MCV 92.2  PLT 148*   Basic Metabolic Panel: Recent Labs  Lab 09/05/22 0355 09/06/22 1005 09/07/22 0900 09/09/22 1000 09/10/22 0928  NA 135 139 140 138 137  K 3.0* 3.5 3.4* 3.0* 3.9  CL 101 105 105 104 103  CO2 23 25 24 22 22   GLUCOSE 98 138* 90  122* 123*  BUN 95* 88* 85* 91* 87*  CREATININE 3.09* 3.10* 3.22* 3.56* 3.82*  CALCIUM 8.2* 8.6* 8.6* 8.5* 8.3*   GFR: Estimated Creatinine Clearance: 15.8 mL/min (A) (by C-G formula based on SCr of 3.82 mg/dL (H)). Liver Function Tests: No results for input(s): "AST", "ALT", "ALKPHOS", "BILITOT", "PROT", "ALBUMIN" in the last 168 hours. No results for input(s): "LIPASE", "AMYLASE" in the last 168 hours. No results for input(s): "AMMONIA" in the last 168 hours. Coagulation Profile: No results for input(s): "INR", "PROTIME" in the last 168 hours. Cardiac Enzymes: No results for input(s): "CKTOTAL", "CKMB", "CKMBINDEX", "TROPONINI" in the last 168 hours. BNP (last 3  results) No results for input(s): "PROBNP" in the last 8760 hours. HbA1C: No results for input(s): "HGBA1C" in the last 72 hours. CBG: No results for input(s): "GLUCAP" in the last 168 hours. Lipid Profile: No results for input(s): "CHOL", "HDL", "LDLCALC", "TRIG", "CHOLHDL", "LDLDIRECT" in the last 72 hours. Thyroid Function Tests: No results for input(s): "TSH", "T4TOTAL", "FREET4", "T3FREE", "THYROIDAB" in the last 72 hours. Anemia Panel: No results for input(s): "VITAMINB12", "FOLATE", "FERRITIN", "TIBC", "IRON", "RETICCTPCT" in the last 72 hours. Sepsis Labs: No results for input(s): "PROCALCITON", "LATICACIDVEN" in the last 168 hours.  No results found for this or any previous visit (from the past 240 hour(s)).       Radiology Studies: No results found.      Scheduled Meds:  apixaban  2.5 mg Oral BID   Chlorhexidine Gluconate Cloth  6 each Topical Daily   cyanocobalamin  1,000 mcg Oral Daily   diltiazem  360 mg Oral Daily   feeding supplement  237 mL Oral BID BM   metoprolol succinate  100 mg Oral Daily   multivitamins with iron  1 tablet Oral Daily   pantoprazole  40 mg Oral Daily   polyethylene glycol  17 g Oral Daily   rosuvastatin  10 mg Oral Daily   senna  1 tablet Oral Daily   Continuous Infusions:  ceFAZolin 2 g (09/11/22 1003)     LOS: 24 days     Tresa Moore, MD [facility]   If 7PM-7AM, please contact night-coverage  09/11/2022, 3:30 PM

## 2022-09-11 NOTE — Care Management Important Message (Signed)
Important Message  Patient Details  Name: Brett Riley MRN: [id number] Date of Birth: 05-22-1935   Medicare Important Message Given:  Yes  Patient asleep during time of visit, no family in room.  Confirmed copy of Medicare IM available in room for reference.   [name] 09/11/2022, 11:04 AM

## 2022-09-12 ENCOUNTER — Inpatient Hospital Stay: Payer: Medicare HMO | Admitting: Infectious Diseases

## 2022-09-12 DIAGNOSIS — M25561 Pain in right knee: Secondary | ICD-10-CM

## 2022-09-12 DIAGNOSIS — I509 Heart failure, unspecified: Secondary | ICD-10-CM

## 2022-09-12 DIAGNOSIS — M4646 Discitis, unspecified, lumbar region: Secondary | ICD-10-CM | POA: Diagnosis not present

## 2022-09-12 DIAGNOSIS — M4626 Osteomyelitis of vertebra, lumbar region: Secondary | ICD-10-CM | POA: Diagnosis not present

## 2022-09-12 DIAGNOSIS — N189 Chronic kidney disease, unspecified: Secondary | ICD-10-CM

## 2022-09-12 DIAGNOSIS — I4891 Unspecified atrial fibrillation: Secondary | ICD-10-CM

## 2022-09-12 DIAGNOSIS — J9601 Acute respiratory failure with hypoxia: Secondary | ICD-10-CM | POA: Diagnosis not present

## 2022-09-12 LAB — CBC WITH DIFFERENTIAL/PLATELET
Abs Immature Granulocytes: 0.02 10*3/uL (ref 0.00–0.07)
Basophils Absolute: 0 10*3/uL (ref 0.0–0.1)
Basophils Relative: 0 %
Eosinophils Absolute: 0.4 10*3/uL (ref 0.0–0.5)
Eosinophils Relative: 6 %
HCT: 25.6 % — ABNORMAL LOW (ref 39.0–52.0)
Hemoglobin: 8 g/dL — ABNORMAL LOW (ref 13.0–17.0)
Immature Granulocytes: 0 %
Lymphocytes Relative: 22 %
Lymphs Abs: 1.6 10*3/uL (ref 0.7–4.0)
MCH: 28.8 pg (ref 26.0–34.0)
MCHC: 31.3 g/dL (ref 30.0–36.0)
MCV: 92.1 fL (ref 80.0–100.0)
Monocytes Absolute: 0.3 10*3/uL (ref 0.1–1.0)
Monocytes Relative: 4 %
Neutro Abs: 4.8 10*3/uL (ref 1.7–7.7)
Neutrophils Relative %: 68 %
Platelets: 124 10*3/uL — ABNORMAL LOW (ref 150–400)
RBC: 2.78 MIL/uL — ABNORMAL LOW (ref 4.22–5.81)
RDW: 15.3 % (ref 11.5–15.5)
WBC: 7.1 10*3/uL (ref 4.0–10.5)
nRBC: 0 % (ref 0.0–0.2)

## 2022-09-12 LAB — BASIC METABOLIC PANEL WITH GFR
Anion gap: 11 (ref 5–15)
BUN: 84 mg/dL — ABNORMAL HIGH (ref 8–23)
CO2: 23 mmol/L (ref 22–32)
Calcium: 8.4 mg/dL — ABNORMAL LOW (ref 8.9–10.3)
Chloride: 106 mmol/L (ref 98–111)
Creatinine, Ser: 3.64 mg/dL — ABNORMAL HIGH (ref 0.61–1.24)
GFR, Estimated: 16 mL/min — ABNORMAL LOW
Glucose, Bld: 83 mg/dL (ref 70–99)
Potassium: 3.4 mmol/L — ABNORMAL LOW (ref 3.5–5.1)
Sodium: 140 mmol/L (ref 135–145)

## 2022-09-12 LAB — SEDIMENTATION RATE: Sed Rate: 106 mm/h — ABNORMAL HIGH (ref 0–20)

## 2022-09-12 LAB — C-REACTIVE PROTEIN: CRP: 3.1 mg/dL — ABNORMAL HIGH

## 2022-09-12 MED ORDER — DOXYCYCLINE HYCLATE 100 MG PO TABS
100.0000 mg | ORAL_TABLET | Freq: Two times a day (BID) | ORAL | Status: DC
  Administered 2022-09-12 – 2022-09-22 (×21): 100 mg via ORAL
  Filled 2022-09-12 (×26): qty 1

## 2022-09-12 NOTE — TOC Progression Note (Signed)
Transition of Care [facility]) - Progression Note    Patient Details  Name: Brett Riley MRN: [id number] Date of Birth: 01/19/1936  Transition of Care [facility]) CM/SW Contact  Truddie Hidden, RN Phone Number: 09/12/2022, 11:44 AM  Clinical Narrative:   MD notified [name] is pending.    Spoke with patient's wife at bedside to advised [name] is still pending.   Spoke with Brittney at [facility] to advise [name] is pending   Expected Discharge Plan: Skilled Nursing Facility Barriers to Discharge: Continued Medical Work up, English as a second language teacher  Expected Discharge Plan and Services     Post Acute Care Choice: Skilled Nursing Facility Living arrangements for the past 2 months: Single Family Home                                       Social Determinants of Health (SDOH) Interventions SDOH Screenings   Food Insecurity: No Food Insecurity (08/18/2022)  Housing: Low Risk  (08/18/2022)  Transportation Needs: No Transportation Needs (08/18/2022)  Utilities: Not At Risk (08/18/2022)  Depression (PHQ2-9): Low Risk  (06/06/2022)  Financial Resource Strain: Low Risk  (08/29/2020)  Physical Activity: Insufficiently Active (08/29/2020)  Social Connections: Unknown (08/29/2020)  Stress: No Stress Concern Present (08/29/2020)  Tobacco Use: Low Risk  (09/05/2022)    Readmission Risk Interventions     No data to display

## 2022-09-12 NOTE — TOC Progression Note (Signed)
Transition of Care [facility]) - Progression Note    Patient Details  Name: Brett Riley MRN: [id number] Date of Birth: 01-11-1936  Transition [facility] Care [facility]) CM/SW Contact  Truddie Hidden, RN Phone Number: 09/12/2022, 12:16 PM  Clinical Narrative:    Per [facility] assistant Drucie Opitz still pending.  MD and [facility] notified.    Expected Discharge Plan: Skilled Nursing Facility Barriers to Discharge: Continued Medical Work up, English as a second language teacher  Expected Discharge Plan and Services     Post Acute Care Choice: Skilled Nursing Facility Living arrangements for the past 2 months: Single Family Home                                       Social Determinants of Health (SDOH) Interventions SDOH Screenings   Food Insecurity: No Food Insecurity (08/18/2022)  Housing: Low Risk  (08/18/2022)  Transportation Needs: No Transportation Needs (08/18/2022)  Utilities: Not At Risk (08/18/2022)  Depression (PHQ2-9): Low Risk  (06/06/2022)  Financial Resource Strain: Low Risk  (08/29/2020)  Physical Activity: Insufficiently Active (08/29/2020)  Social Connections: Unknown (08/29/2020)  Stress: No Stress Concern Present (08/29/2020)  Tobacco Use: Low Risk  (09/05/2022)    Readmission Risk Interventions     No data to display

## 2022-09-12 NOTE — Progress Notes (Signed)
   09/12/22 1100  Spiritual Encounters  Type of Visit Follow up  Care provided to: Family  Referral source Chaplain assessment  Reason for visit Routine spiritual support  OnCall Visit No  Spiritual Framework  Presenting Themes Goals in life/care;Coping tools;Caregiving needs;Courage hope and growth  Values/beliefs Christian  Community/Connection Family;Friend(s);Significant other;Faith community;Spiritual leader  Patient Stress Factors Health changes  Family Stress Factors None identified  Interventions  Spiritual Care Interventions Made Compassionate presence;Reflective listening;Self-care teaching  Intervention Outcomes  Outcomes Awareness around self/spiritual resourses;Autonomy/agency;Reduced anxiety;Reduced fear;Reduced isolation  Spiritual Care Plan  Spiritual Care Issues Still Outstanding Chaplain will continue to follow   Chaplain met with patients wife in the [place] and noticed that she had been crying. She notified me that her husband maybe going to rehab soon and that they are just waiting for the insurance company. I talked to her about taking time for self care and getting rest. She informed me that she is at the hospital from 8 am to 8 pm everyday and has no time to do anything else. Chaplain listened to the patients wife while she talked about her faith and her faith community. Chaplain will continue to follow up with patient and his wife.

## 2022-09-12 NOTE — Progress Notes (Signed)
PROGRESS NOTE    CAP MASSI  [name] DOB: Oct 20, 1935 DOA: 08/18/2022 PCP: Dale Lamar, MD    Brief Narrative:  87 y.o. male with medical history significant of chronic dCHF, HTN, HLD, CAD, CKD-3a, anemia, chronic A fib on Eliquis, who presents 08/18/2022 to ED from rehab, chief complaint SOB.  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 Net IO Since Admission: -5,549.14 mL [08/20/22 1608]. Heparin stopped d/t rectal bleeding overnight.  06/19: euvolemic per cardio, d/c lasix, O2 requirement still high, CT chest reviewed, ?edema/atypical infection, given clinically worsening respiratory status have asked pulmonary and ID to consult. Have d/c amiodarone and ordered steroids in case amio toxicity.  06/20: ID and pulmonary assisting with the management.   6/22-6/25: patient feels clinically improved. Stable with oxygen via [place] and weaned down to 2-3L. Patient requiring judicial adjustments to balance between hypervolemia and renal injury with diuresis. Unable to pull fluid from lungs with thoracentesis so medical management is best option. His IV Abx to treat his vertebral osteomyelitis from previous admission continue unchanged and managed by ID.    6/28: Bed offered at [facility].  Authorization pending 7/1: No status changes.  Per patient's daughter he has been seeming more depressed.  I have offered outpatient referral to psychiatry.   7/2: Insurance authorization for LTAC continues to be pending   7/3 : Peer-to-peer review done.  Not a candidate for LTAC.   Approved for skilled nursing facility patient   7/9 : Medically stable and ready for discharge to skilled nursing facility 7/10: No acute events.  Pending insurance authorization   Assessment & Plan:   Principal Problem:   Acute respiratory failure with hypoxia (HCC) Active Problems:   Acute on chronic heart failure with preserved ejection fraction (HFpEF) (HCC)   MSSA bacteremia   Vertebral osteomyelitis (HCC)   CAD (coronary artery disease)   Myocardial injury   Hypertension   Hyperlipidemia   Chronic kidney disease, stage 3a (HCC)   Normocytic anemia   Atrial fibrillation, chronic (HCC)   Paroxysmal atrial fibrillation (HCC)   HCAP (healthcare-associated pneumonia)   Sepsis (HCC)   Pleural effusion   Amiodarone pulmonary toxicity   Acute on chronic HFrEF (heart failure with reduced ejection fraction) (HCC)   Pleural effusion on right   Status post thoracentesis   Heart failure with preserved ejection fraction (HCC)   Stage 3 chronic kidney disease (HCC)  Acute on chronic respiratory failure with hypoxia due to acute on chronic CHF  possible pneumonia, bilateral small pleural effusion- as seen on chest CT Not on oxygen supplementation at baseline Has been weaned off oxygen completely and is on room air - wean as tolerated -Completed course of azithromycin. - Continue Ancef as written for his osteomyelitis.  Last dose 7/12 - incentive spirometer q1hr.  Encourage use - Off steroids   Acute on chronic diastolic CHF- Markedly net negative over course of admission.  Appears euvolemic Plan: Lasix on hold due to worsening renal function TED hose Strict I's and O's, daily weights   Pleural effusion- IR unable to perform thoracentesis 6/20, 6/21. Medical management.  Clinically improving respiratory status.      Atrial fibrillation, chronic (HCC): Heart rate now well controlled in 60-70s diltiazem, metoprolol per cardiology Lisinopril held with AKI Restarted eliquis  (renally dosed) in setting of no new bleeding and patient's hgb has been stable >5 days after concern for GI bleed early in admission on heparin gtt.   Recent hx of MSSA bacteremia vertebral osteomyelitis (HCC): continue IV Ancef until 7/12. Follow up with ID outpatient 7/11 repeat Blood culture NGTD   CAD  Myocardial injury  HLD:  trop  258, possibly due to demand ischemia secondary to CHF exacerbation.  Patient does not have chest pain. Crestor, beta blocker, Aspirin 81 discontinued as he's on eliquis  Cardiology following  May need coronary angiography at some point but will defer for now     AKI on Chronic kidney disease, stage 3a (HCC):  Baseline creatinine 1.5-1.9.  Uptrending creatinine level and has peaked at 3.82 Plan: Discussed with nephrology.  Lasix on hold.  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  Offered kidney biopsy.  Poor candidate at this time.  Deferred outpatient evaluation.   Normocytic anemia:  hgb stable around 8.5-9. S/p 1 unit PRBC 06/18    Hypertension Continue to closely monitor vital signs.   Resolved hypokalemia   GERD PPI    Right knee pain -X-ray with chronic arthritic changes.  Recommend frequent mobility     Hypokalemia Secondary to diuretic therapy.  Supplemented   DVT prophylaxis: Eliquis Code Status: DNR Family Communication: Spouse at bedside 7/10 Disposition Plan: Status is: Inpatient Remains inpatient appropriate because: Pending insurance authorization.  Unsafe discharge plan.   Level of care: Progressive  Consultants:  Nephrology  Procedures:  None  Antimicrobials: Cefazolin   Subjective: Seen and examined.  Resting in bed.  Eating more.  Objective: Vitals:   09/12/22 0500 09/12/22 0500 09/12/22 0915 09/12/22 1110  BP:  (!) 157/68 (!) 150/74 (!) 144/72  Pulse:  66 75 68  Resp:  18 16 16   Temp:   98.4 F (36.9 C) 98.5 F (36.9 C)  TempSrc:   Oral Oral  SpO2:  95% 93% 96%   Weight: 88 kg     Height:        Intake/Output Summary (Last 24 hours) at 09/12/2022 1449 Last data filed at 09/12/2022 1430 Gross per 24 hour  Intake 730.13 ml  Output 1800 ml  Net -1069.87 ml   Filed Weights   09/10/22 0441 09/11/22 0333 09/12/22 0500  Weight: 88.3 kg 88.1 kg 88 kg    Examination:  General exam: NAD Respiratory system: Clear to auscultation. Respiratory effort normal. Cardiovascular system: S1-S2, RRR, no murmurs, no pedal edema Gastrointestinal system: Soft, NT/ND, normal bowel sounds Central nervous system: Alert and oriented. No focal neurological deficits. Extremities: Decreased power bilateral lower extremities Skin: No rashes, lesions or ulcers Psychiatry: Judgement and insight appear normal. Mood & affect appropriate.     Data Reviewed: I have personally reviewed following labs and imaging studies  CBC: Recent Labs  Lab 09/07/22 0900 09/12/22 0755  WBC 6.6 7.1  NEUTROABS  --  4.8  HGB 8.3* 8.0*  HCT 25.9* 25.6*  MCV 92.2 92.1  PLT 148* 124*   Basic Metabolic Panel: Recent Labs  Lab 09/06/22 1005 09/07/22 0900 09/09/22 1000 09/10/22 0928 09/12/22 0755  NA 139 140 138 137 140  K 3.5 3.4* 3.0* 3.9 3.4*  CL 105 105 104 103 106  CO2 25 24 22 22  23  GLUCOSE 138* 90 122* 123* 83  BUN 88* 85* 91* 87* 84*  CREATININE 3.10* 3.22* 3.56* 3.82* 3.64*  CALCIUM 8.6* 8.6* 8.5* 8.3* 8.4*   GFR: Estimated Creatinine Clearance: 16.6 mL/min (A) (by C-G formula based on SCr of 3.64 mg/dL (H)). Liver Function Tests: No results for input(s): "AST", "ALT", "ALKPHOS", "BILITOT", "PROT", "ALBUMIN" in the last 168 hours. No results for input(s): "LIPASE", "AMYLASE" in the last 168 hours. No results for input(s): "AMMONIA" in the last 168 hours. Coagulation Profile: No results for input(s): "INR", "PROTIME" in the last 168 hours. Cardiac Enzymes: No results for input(s): "CKTOTAL", "CKMB", "CKMBINDEX", "TROPONINI" in the last 168 hours. BNP (last 3  results) No results for input(s): "PROBNP" in the last 8760 hours. HbA1C: No results for input(s): "HGBA1C" in the last 72 hours. CBG: No results for input(s): "GLUCAP" in the last 168 hours. Lipid Profile: No results for input(s): "CHOL", "HDL", "LDLCALC", "TRIG", "CHOLHDL", "LDLDIRECT" in the last 72 hours. Thyroid Function Tests: No results for input(s): "TSH", "T4TOTAL", "FREET4", "T3FREE", "THYROIDAB" in the last 72 hours. Anemia Panel: No results for input(s): "VITAMINB12", "FOLATE", "FERRITIN", "TIBC", "IRON", "RETICCTPCT" in the last 72 hours. Sepsis Labs: No results for input(s): "PROCALCITON", "LATICACIDVEN" in the last 168 hours.  No results found for this or any previous visit (from the past 240 hour(s)).       Radiology Studies: No results found.      Scheduled Meds:  apixaban  2.5 mg Oral BID   Chlorhexidine Gluconate Cloth  6 each Topical Daily   cyanocobalamin  1,000 mcg Oral Daily   diltiazem  360 mg Oral Daily   doxycycline  100 mg Oral Q12H   feeding supplement  237 mL Oral BID BM   metoprolol succinate  100 mg Oral Daily   multivitamins with iron  1 tablet Oral Daily   pantoprazole  40 mg Oral Daily   polyethylene glycol  17 g Oral Daily   rosuvastatin  10 mg Oral Daily   senna  1 tablet Oral Daily   Continuous Infusions:  sodium chloride 5 mL/hr at 09/11/22 2138     LOS: 25 days     Tresa Moore, MD [facility]   If 7PM-7AM, please contact night-coverage  09/12/2022, 2:49 PM

## 2022-09-12 NOTE — Progress Notes (Signed)
ID Pt in bed Says he is doing okay Wife at bed side  O/e awake, but still lethargic Patient Vitals for the past 24 hrs:  BP Temp Temp src Pulse Resp SpO2 Weight  09/12/22 1522 134/64 97.7 F (36.5 C) Oral 60 16 95 % --  09/12/22 1110 (!) 144/72 98.5 F (36.9 C) Oral 68 16 96 % --  09/12/22 0915 (!) 150/74 98.4 F (36.9 C) Oral 75 16 93 % --  09/12/22 0500 (!) 157/68 -- -- 66 18 95 % --  09/12/22 0500 -- -- -- -- -- -- 88 kg  09/11/22 2350 139/69 98.2 F (36.8 C) -- 68 18 93 % --  09/11/22 2033 (!) 148/76 98 F (36.7 C) -- 68 18 93 % --   Chest b/l air entry [id number] Abd soft Moving both legs better  Labs    Latest Ref Rng & Units 09/12/2022    7:55 AM 09/07/2022    9:00 AM 09/02/2022    9:30 AM  CBC  WBC 4.0 - 10.5 K/uL 7.1  6.6  8.0   Hemoglobin 13.0 - 17.0 g/dL 8.0  8.3  8.9   Hematocrit 39.0 - 52.0 % 25.6  25.9  27.4   Platelets 150 - 400 K/uL 124  148  236        Latest Ref Rng & Units 09/12/2022    7:55 AM 09/10/2022    9:28 AM 09/09/2022   10:00 AM  CMP  Glucose 70 - 99 mg/dL 83  098  119   BUN 8 - 23 mg/dL 84  87  91   Creatinine 0.61 - 1.24 mg/dL 1.47  8.29  5.62   Sodium 135 - 145 mmol/L 140  137  138   Potassium 3.5 - 5.1 mmol/L 3.4  3.9  3.0   Chloride 98 - 111 mmol/L 106  103  104   CO2 22 - 32 mmol/L 23  22  22    Calcium 8.9 - 10.3 mg/dL 8.4  8.3  8.5     Sed rate 106 CRP 3.3  Impression/recommendation Pt presented with Acute hypoxic resp failure and b/l pulmonary infiltrates thought to be due to acute on chronic CHF and possible pneumonia- was treated with steroids ( for concern for amiodarone pneumonitis) diuretics for CHF and azithromycin for possible pneumonia  MSSA bacteremia and L1-L2 disicits/osteomyelitis On cefazolin- on 7/12 he is completing 6 weeks Because of concern for AIN will Dc now Change to doxycycline 100mg  PO BID for 6 weeks. May need more. 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

## 2022-09-12 NOTE — Progress Notes (Signed)
Physical Therapy Treatment Patient Details Name: Brett Riley MRN: [id number] DOB: 09-10-1935 Today's Date: 09/12/2022   History of Present Illness Brett Riley is a 87 y/o M comes to [facility] 6/16 with respiratory failure; PMH includes MI, CHF, a-fib, HTN, anemia. 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    PT Comments  Pt in bed eating spaghetti on arrival, author exits only to return later in day. Pt agreeable to session, pain controlled >90% of session. Wife steps out during session. Bed features used to achieve short sitting posture for much of therex in session, manual resistance provided when gravity alone did not achieve targeted intensity. Pt reports comfortable at end of session sitting up HOB ~40 degrees, all needs met.      Assistance Recommended at Discharge Frequent or constant Supervision/Assistance  If plan is discharge home, recommend the following:  Can travel by private vehicle    Assistance with cooking/housework;Assist for transportation;Two people to help with walking and/or transfers;Two people to help with bathing/dressing/bathroom;Help with stairs or ramp for entrance;Direct supervision/assist for medications management   No  Equipment Recommendations  Other (comment)    Recommendations for Other Services       Precautions / Restrictions Precautions Precautions: Fall Restrictions Weight Bearing Restrictions: No     Mobility  Bed Mobility                    Transfers                        Ambulation/Gait                   Stairs             Wheelchair Mobility     Tilt Bed    Modified Rankin (Stroke Patients Only)       Balance                                            Cognition Arousal/Alertness: Awake/alert Behavior  During Therapy: Flat affect Overall Cognitive Status: Within Functional Limits for tasks assessed                                          Exercises Other Exercises Other Exercises: Exercises in session as described below: -Cervical retraction into pillow x10 -Heel slides x25 bilat, minA lift, min resist push -SAQ  1x15 (30 degrees knee flexion)   -BUE wand flexion without wand x10, cues for scapular depression, thoracic extension  Bed transitioned to chair setting, HOB at 55 degrees, knees and hips ~75 degrees -closed chain heel raises against foot board (short sitting) 2x15 -short sitting alternate marching 2x16 -short sitting trunk flexion c BUE assist on rails 2x10, modA of back  -short sitting BUE isometric row into mattress 1x10x3secH, cues for scapular retraction -short sitting LAQ 2x10   General Comments        Pertinent Vitals/Pain Pain Assessment Pain Assessment: 0-10 Pain Location: low back Pain Descriptors / Indicators: Guarding, Discomfort, Grimacing (with some Left leg movement early in session, otherwise well controlled.)    Home Living  Prior Function            PT Goals (current goals can now be found in the care plan section) Acute Rehab PT Goals Patient Stated Goal: get stronger PT Goal Formulation: With patient/family Time For Goal Achievement: 09/26/22 Potential to Achieve Goals: Fair Progress towards PT goals: Progressing toward goals    Frequency    Min 1X/week      PT Plan Current plan remains appropriate    Co-evaluation              AM-PAC PT "6 Clicks" Mobility   Outcome Measure  Help needed turning from your back to your side while in a flat bed without using bedrails?: Total Help needed moving from lying on your back to sitting on the side of a flat bed without using bedrails?: Total Help needed moving to and from a bed to a chair (including a wheelchair)?: Total Help  needed standing up from a chair using your arms (e.g., wheelchair or bedside chair)?: Total Help needed to walk in hospital room?: Total Help needed climbing 3-5 steps with a railing? : Total 6 Click Score: 6    End of Session   Activity Tolerance: Patient limited by fatigue;Patient tolerated treatment well;No increased pain Patient left: with call bell/phone within reach;in bed;with bed alarm set Nurse Communication:  (antiemetics) PT Visit Diagnosis: Muscle weakness (generalized) (M62.81);Difficulty in walking, not elsewhere classified (R26.2);Unsteadiness on feet (R26.81);Other abnormalities of gait and mobility (R26.89)     Time: 6578-4696 PT Time Calculation (min) (ACUTE ONLY): 35 min  Charges:    $Therapeutic Exercise: 23-37 mins PT General Charges $$ ACUTE PT VISIT: 1 Visit                    3:18 PM, 09/12/22 Rosamaria Lints, PT, DPT Physical Therapist - [facility]  [phone] (ASCOM)     Easton Fetty C 09/12/2022, 3:13 PM

## 2022-09-12 NOTE — Plan of Care (Signed)
°  Problem: Education: °Goal: Ability to demonstrate management of disease process will improve °Outcome: Progressing °Goal: Ability to verbalize understanding of medication therapies will improve °Outcome: Progressing °Goal: Individualized Educational Video(s) °Outcome: Progressing °  °Problem: Activity: °Goal: Capacity to carry out activities will improve °Outcome: Progressing °  °Problem: Cardiac: °Goal: Ability to achieve and maintain adequate cardiopulmonary perfusion will improve °Outcome: Progressing °  °Problem: Activity: °Goal: Ability to tolerate increased activity will improve °Outcome: Progressing °  °Problem: Clinical Measurements: °Goal: Ability to maintain a body temperature in the normal range will improve °Outcome: Progressing °  °Problem: Respiratory: °Goal: Ability to maintain adequate ventilation will improve °Outcome: Progressing °Goal: Ability to maintain a clear airway will improve °Outcome: Progressing °  °Problem: Education: °Goal: Knowledge of General Education information will improve °Description: Including pain rating scale, medication(s)/side effects and non-pharmacologic comfort measures °Outcome: Progressing °  °Problem: Health Behavior/Discharge Planning: °Goal: Ability to manage health-related needs will improve °Outcome: Progressing °  °Problem: Clinical Measurements: °Goal: Ability to maintain clinical measurements within normal limits will improve °Outcome: Progressing °Goal: Will remain free from infection °Outcome: Progressing °Goal: Diagnostic test results will improve °Outcome: Progressing °Goal: Respiratory complications will improve °Outcome: Progressing °Goal: Cardiovascular complication will be avoided °Outcome: Progressing °  °Problem: Activity: °Goal: Risk for activity intolerance will decrease °Outcome: Progressing °  °Problem: Nutrition: °Goal: Adequate nutrition will be maintained °Outcome: Progressing °  °Problem: Coping: °Goal: Level of anxiety will  decrease °Outcome: Progressing °  °Problem: Elimination: °Goal: Will not experience complications related to bowel motility °Outcome: Progressing °Goal: Will not experience complications related to urinary retention °Outcome: Progressing °  °Problem: Pain Managment: °Goal: General experience of comfort will improve °Outcome: Progressing °  °Problem: Safety: °Goal: Ability to remain free from injury will improve °Outcome: Progressing °  °Problem: Skin Integrity: °Goal: Risk for impaired skin integrity will decrease °Outcome: Progressing °  °

## 2022-09-13 ENCOUNTER — Inpatient Hospital Stay: Payer: Medicare HMO

## 2022-09-13 DIAGNOSIS — J9601 Acute respiratory failure with hypoxia: Secondary | ICD-10-CM | POA: Diagnosis not present

## 2022-09-13 MED ORDER — RISAQUAD PO CAPS
1.0000 | ORAL_CAPSULE | Freq: Every day | ORAL | Status: DC
  Administered 2022-09-13 – 2022-09-25 (×13): 1 via ORAL
  Filled 2022-09-13 (×13): qty 1

## 2022-09-13 NOTE — Progress Notes (Signed)
Pts room air sats 89 at rest. Placed patient on oxygen 2liters nasal cannula, sats increased to 95. Informed md.

## 2022-09-13 NOTE — TOC Progression Note (Addendum)
Transition of Care [facility]) - Progression Note    Patient Details  Name: Brett Riley MRN: [id number] Date of Birth: 01/14/1936  Transition of Care [facility]) CM/SW Contact  Liliana Cline, LCSW Phone Number: 09/13/2022, 9:30 AM  Clinical Narrative:    [name] still pending at this time.   2:03- Auth still pending.   Expected Discharge Plan: Skilled Nursing Facility Barriers to Discharge: Continued Medical Work up, English as a second language teacher  Expected Discharge Plan and Services     Post Acute Care Choice: Skilled Nursing Facility Living arrangements for the past 2 months: Single Family Home                                       Social Determinants of Health (SDOH) Interventions SDOH Screenings   Food Insecurity: No Food Insecurity (08/18/2022)  Housing: Low Risk  (08/18/2022)  Transportation Needs: No Transportation Needs (08/18/2022)  Utilities: Not At Risk (08/18/2022)  Depression (PHQ2-9): Low Risk  (06/06/2022)  Financial Resource Strain: Low Risk  (08/29/2020)  Physical Activity: Insufficiently Active (08/29/2020)  Social Connections: Unknown (08/29/2020)  Stress: No Stress Concern Present (08/29/2020)  Tobacco Use: Low Risk  (09/05/2022)    Readmission Risk Interventions     No data to display

## 2022-09-13 NOTE — Progress Notes (Signed)
PROGRESS NOTE    Brett Riley  [name] DOB: [birth year] DOA: 08/18/2022 PCP: Dale Elmo, MD    Brief Narrative:  87 y.o. male with medical history significant of chronic dCHF, HTN, HLD, CAD, CKD-3a, anemia, chronic A fib on Eliquis, who presents 08/18/2022 to ED from rehab, chief complaint SOB.  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 Net IO Since Admission: -5,549.14 mL [08/20/22 1608]. Heparin stopped d/t rectal bleeding overnight.  06/19: euvolemic per cardio, d/c lasix, O2 requirement still high, CT chest reviewed, ?edema/atypical infection, given clinically worsening respiratory status have asked pulmonary and ID to consult. Have d/c amiodarone and ordered steroids in case amio toxicity.  06/20: ID and pulmonary assisting with the management.   6/22-6/25: patient feels clinically improved. Stable with oxygen via [place] and weaned down to 2-3L. Patient requiring judicial adjustments to balance between hypervolemia and renal injury with diuresis. Unable to pull fluid from lungs with thoracentesis so medical management is best option. His IV Abx to treat his vertebral osteomyelitis from previous admission continue unchanged and managed by ID.    6/28: Bed offered at [facility].  Authorization pending 7/1: No status changes.  Per patient's daughter he has been seeming more depressed.  I have offered outpatient referral to psychiatry.   7/2: Insurance authorization for LTAC continues to be pending   7/3 : Peer-to-peer review done.  Not a candidate for LTAC.   Approved for skilled nursing facility patient   7/9 : Medically stable and ready for discharge to skilled nursing facility 7/10: No acute events.  Pending insurance authorization   Assessment & Plan:   Principal Problem:   Acute respiratory failure with hypoxia (HCC) Active Problems:   Acute on chronic heart failure with preserved ejection fraction (HFpEF) (HCC)   MSSA bacteremia   Vertebral osteomyelitis (HCC)   CAD (coronary artery disease)   Myocardial injury   Hypertension   Hyperlipidemia   Chronic kidney disease, stage 3a (HCC)   Normocytic anemia   Atrial fibrillation, chronic (HCC)   Paroxysmal atrial fibrillation (HCC)   HCAP (healthcare-associated pneumonia)   Sepsis (HCC)   Pleural effusion   Amiodarone pulmonary toxicity   Acute on chronic HFrEF (heart failure with reduced ejection fraction) (HCC)   Pleural effusion on right   Status post thoracentesis   Heart failure with preserved ejection fraction (HCC)   Stage 3 chronic kidney disease (HCC)  Acute on chronic respiratory failure with hypoxia due to acute on chronic CHF  possible pneumonia, bilateral small pleural effusion- as seen on chest CT Not on oxygen supplementation at baseline Has been weaned off oxygen completely and is on room air - wean as tolerated -Completed course of azithromycin. - Continue Ancef as written for his osteomyelitis.  Last dose 7/12 - incentive spirometer q1hr.  Encourage use - Off steroids - Ready for dc   Acute on chronic diastolic CHF- Markedly net negative over course of admission.  Appears euvolemic Plan: Lasix on hold due to worsening renal function TED hose Strict I's and O's, daily weights   Pleural effusion- IR unable to perform thoracentesis  6/20, 6/21. Medical management. Clinically improving respiratory status.      Atrial fibrillation, chronic (HCC): Heart rate now well controlled in 60-70s diltiazem, metoprolol per cardiology Lisinopril held with  AKI Restarted eliquis (renally dosed) in setting of no new bleeding and patient's hgb has been stable >5 days after concern for GI bleed early in admission on heparin gtt.   Recent hx of MSSA bacteremia vertebral osteomyelitis (HCC): Ancef stopped 7/11 Doxycycline 100 PO BID x 6 weeks Remove PICC line 7/12   CAD  Myocardial injury  HLD:  trop  258, possibly due to demand ischemia secondary to CHF exacerbation.  Patient does not have chest pain. Crestor, beta blocker, Aspirin 81 discontinued as he's on eliquis  Cardiology following  May need coronary angiography at some point but will defer for now     AKI on Chronic kidney disease, stage 3a (HCC):  Baseline creatinine 1.5-1.9.  Uptrending creatinine level and has peaked at 3.82 Plan: Discussed with nephrology.  Lasix on hold.  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  Offered kidney biopsy.  Poor candidate at this time.  Deferred outpatient evaluation.   Normocytic anemia:  hgb stable around 8.5-9. S/p 1 unit PRBC 06/18    Hypertension Continue to closely monitor vital signs.   Resolved hypokalemia   GERD PPI    Right knee pain -X-ray with chronic arthritic changes.  Recommend frequent mobility     Hypokalemia Secondary to diuretic therapy.  Supplemented   DVT prophylaxis: Eliquis Code Status: DNR Family Communication: Spouse at bedside 7/10 Disposition Plan: Status is: Inpatient Remains inpatient appropriate because: Pending insurance authorization.  Unsafe discharge plan.   Level of care: Progressive  Consultants:  Nephrology  Procedures:  None  Antimicrobials: Doxycycline   Subjective: Seen and examined.  Wife at bedside.  No acute events  Objective: Vitals:   09/13/22 0356 09/13/22 0357 09/13/22 0905 09/13/22 1147  BP:  (!) 144/73 (!) 143/70 (!) 156/72  Pulse:  66 71 73  Resp:  18 16 16   Temp: 98.3 F (36.8 C)  98.3 F (36.8 C) 98.1 F (36.7 C)  TempSrc: Oral      SpO2:  91% 93% 95%  Weight: 87.8 kg     Height:        Intake/Output Summary (Last 24 hours) at 09/13/2022 1346 Last data filed at 09/13/2022 1049 Gross per 24 hour  Intake 580 ml  Output 1450 ml  Net -870 ml   Filed Weights   09/11/22 0333 09/12/22 0500 09/13/22 0356  Weight: 88.1 kg 88 kg 87.8 kg    Examination:  General exam: NAD Respiratory system: Clear to auscultation. Respiratory effort normal. Cardiovascular system: S1-S2, RRR, no murmurs, no pedal edema Gastrointestinal system: Soft, NT/ND, normal bowel sounds Central nervous system: Alert and oriented. No focal neurological deficits. Extremities: Decreased power bilateral lower extremities Skin: No rashes, lesions or ulcers Psychiatry: Judgement and insight appear normal. Mood & affect appropriate.     Data Reviewed: I have personally reviewed following labs and imaging studies  CBC: Recent Labs  Lab 09/07/22 0900 09/12/22 0755  WBC 6.6 7.1  NEUTROABS  --  4.8  HGB 8.3* 8.0*  HCT 25.9* 25.6*  MCV 92.2 92.1  PLT 148* 124*   Basic Metabolic Panel: Recent Labs  Lab 09/07/22 0900 09/09/22 1000 09/10/22 0928 09/12/22 0755  NA 140 138 137 140  K 3.4* 3.0* 3.9 3.4*  CL 105 104 103 106  CO2 24 22 22  23  GLUCOSE 90 122* 123* 83  BUN 85* 91* 87* 84*  CREATININE 3.22* 3.56* 3.82* 3.64*  CALCIUM 8.6* 8.5* 8.3* 8.4*   GFR: Estimated Creatinine Clearance: 16.6 mL/min (A) (by C-G formula based on SCr of 3.64 mg/dL (H)). Liver Function Tests: No results for input(s): "AST", "ALT", "ALKPHOS", "BILITOT", "PROT", "ALBUMIN" in the last 168 hours. No results for input(s): "LIPASE", "AMYLASE" in the last 168 hours. No results for input(s): "AMMONIA" in the last 168 hours. Coagulation Profile: No results for input(s): "INR", "PROTIME" in the last 168 hours. Cardiac Enzymes: No results for input(s): "CKTOTAL", "CKMB", "CKMBINDEX", "TROPONINI" in the last 168 hours. BNP (last 3 results) No results for  input(s): "PROBNP" in the last 8760 hours. HbA1C: No results for input(s): "HGBA1C" in the last 72 hours. CBG: No results for input(s): "GLUCAP" in the last 168 hours. Lipid Profile: No results for input(s): "CHOL", "HDL", "LDLCALC", "TRIG", "CHOLHDL", "LDLDIRECT" in the last 72 hours. Thyroid Function Tests: No results for input(s): "TSH", "T4TOTAL", "FREET4", "T3FREE", "THYROIDAB" in the last 72 hours. Anemia Panel: No results for input(s): "VITAMINB12", "FOLATE", "FERRITIN", "TIBC", "IRON", "RETICCTPCT" in the last 72 hours. Sepsis Labs: No results for input(s): "PROCALCITON", "LATICACIDVEN" in the last 168 hours.  No results found for this or any previous visit (from the past 240 hour(s)).       Radiology Studies: No results found.      Scheduled Meds:  acidophilus  1 capsule Oral Daily   apixaban  2.5 mg Oral BID   Chlorhexidine Gluconate Cloth  6 each Topical Daily   cyanocobalamin  1,000 mcg Oral Daily   diltiazem  360 mg Oral Daily   doxycycline  100 mg Oral Q12H   feeding supplement  237 mL Oral BID BM   metoprolol succinate  100 mg Oral Daily   multivitamins with iron  1 tablet Oral Daily   pantoprazole  40 mg Oral Daily   polyethylene glycol  17 g Oral Daily   rosuvastatin  10 mg Oral Daily   senna  1 tablet Oral Daily   Continuous Infusions:  sodium chloride 5 mL/hr at 09/11/22 2138     LOS: 26 days     Tresa Moore, MD [facility]   If 7PM-7AM, please contact night-coverage  09/13/2022, 1:46 PM

## 2022-09-13 NOTE — Progress Notes (Signed)
Picc line removed, catheter and tip intact. Line is  30cm long. Patient tollerated well. No complications.

## 2022-09-13 NOTE — Progress Notes (Addendum)
Physical Therapy Treatment Patient Details Name: Brett Riley MRN: [id number] DOB: 11-10-1935 Today's Date: 09/13/2022   History of Present Illness Brett Riley is a 87 y/o M comes to [facility] 6/16 with respiratory failure; PMH includes MI, CHF, a-fib, HTN, anemia. 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    PT Comments  Pt presents laying in bed with wife in the room, pain 4/10 at rest. Pt requiring modAx2 for supine>sit and lateral scoot transfer to recliner. Sitting balance remains fair due to continued posterior lean, repeated verbal cueing to lean forward, and reduced tolerance to upright positioning. Some impulsivity present prior and during transfer to recliner, but some improvements in patient physical ability to contribute to movement. SPO2 was monitored while pt was in recliner, ranging from 84-86% during activity but quickly recovering to 90-91% during rest breaks.   PT discussed with RN potential supplemental oxygen need during physical activity based on pt response to todays visit.  Pt would benefit from continued therapy to maximize activity tolerance and functional abilities.     Assistance Recommended at Discharge Frequent or constant Supervision/Assistance  If plan is discharge home, recommend the following:  Can travel by private vehicle    Assistance with cooking/housework;Assist for transportation;Two people to help with walking and/or transfers;Two people to help with bathing/dressing/bathroom;Help with stairs or ramp for entrance;Direct supervision/assist for medications management   No  Equipment Recommendations  Other (comment)    Recommendations for Other Services       Precautions / Restrictions Precautions Precautions: Fall Restrictions Weight Bearing Restrictions: No     Mobility  Bed  Mobility Overal bed mobility: Needs Assistance Bed Mobility: Supine to Sit     Supine to sit: Mod assist, +2 for physical assistance     General bed mobility comments: Pt able to initiate supine>sit, but has difficulty with moving LE off EOB and maintaining trunk control    Transfers Overall transfer level: Needs assistance Equipment used: 2 person hand held assist Transfers: Bed to chair/wheelchair/BSC            Lateral/Scoot Transfers: Mod assist, +2 physical assistance General transfer comment: Pt able to initiate transfer/ provide more physical effort. More impulsive today to get into the chair.    Ambulation/Gait                   Stairs             Wheelchair Mobility     Tilt Bed    Modified Rankin (Stroke Patients Only)       Balance Overall balance assessment: Needs assistance Sitting-balance support: Feet supported, Bilateral upper extremity supported Sitting balance-Leahy Scale: Poor Sitting balance - Comments: More prominant posterior lean that briefly improves with verbal cueing. Pt repeatedly needed minA to maintain upright posture and would tire quickly sitting EOB Postural control: Posterior lean                                  Cognition Arousal/Alertness: Awake/alert Behavior During Therapy: WFL for tasks assessed/performed, Impulsive                                   General Comments: Impulsivity specifically before and during bed<>recliner transfer  Exercises Other Exercises Other Exercises: Trunk forward lean x10 in recliner LAQ x10 bilaterally   General Comments        Pertinent Vitals/Pain Pain Assessment Pain Assessment: 0-10 Pain Score: 4  Pain Location: low back Pain Descriptors / Indicators: Guarding, Discomfort, Grimacing    Home Living                          Prior Function            PT Goals (current goals can now be found in the care plan section)  Acute Rehab PT Goals Patient Stated Goal: get stronger PT Goal Formulation: With patient/family Time For Goal Achievement: 09/26/22 Potential to Achieve Goals: Fair Progress towards PT goals: Progressing toward goals    Frequency    Min 1X/week      PT Plan Current plan remains appropriate    Co-evaluation              AM-PAC PT "6 Clicks" Mobility   Outcome Measure  Help needed turning from your back to your side while in a flat bed without using bedrails?: A Lot Help needed moving from lying on your back to sitting on the side of a flat bed without using bedrails?: A Lot Help needed moving to and from a bed to a chair (including a wheelchair)?: A Lot Help needed standing up from a chair using your arms (e.g., wheelchair or bedside chair)?: Total Help needed to walk in hospital room?: Total Help needed climbing 3-5 steps with a railing? : Total 6 Click Score: 9    End of Session Equipment Utilized During Treatment: Gait belt Activity Tolerance: Patient limited by fatigue;No increased pain Patient left: with call bell/phone within reach;in chair;with chair alarm set Nurse Communication: Need for lift equipment PT Visit Diagnosis: Muscle weakness (generalized) (M62.81);Difficulty in walking, not elsewhere classified (R26.2);Unsteadiness on feet (R26.81);Other abnormalities of gait and mobility (R26.89)     Time: 1357-1420 PT Time Calculation (min) (ACUTE ONLY): 23 min  Charges:    $Therapeutic Exercise: 8-22 mins $Therapeutic Activity: 8-22 mins PT General Charges $$ ACUTE PT VISIT: 1 Visit                    Estefana Taylor, PT, SPT 3:41 PM,09/13/22

## 2022-09-14 DIAGNOSIS — J9601 Acute respiratory failure with hypoxia: Secondary | ICD-10-CM | POA: Diagnosis not present

## 2022-09-14 NOTE — Progress Notes (Signed)
PROGRESS NOTE    Brett Riley  EAV:[id number] DOB: November 22, 1935 DOA: 08/18/2022 PCP: Dale [place], MD    Brief Narrative:  87 y.o. male with medical history significant of chronic dCHF, HTN, HLD, CAD, CKD-3a, anemia, chronic A fib on Eliquis, who presents 08/18/2022 to ED from rehab, chief complaint SOB.  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 Net IO Since Admission: -5,549.14 mL [08/20/22 1608]. Heparin stopped d/t rectal bleeding overnight.  06/19: euvolemic per cardio, d/c lasix, O2 requirement still high, CT chest reviewed, ?edema/atypical infection, given clinically worsening respiratory status have asked pulmonary and ID to consult. Have d/c amiodarone and ordered steroids in case amio toxicity.  06/20: ID and pulmonary assisting with the management.   6/22-6/25: patient feels clinically improved. Stable with oxygen via [place] and weaned down to 2-3L. Patient requiring judicial adjustments to balance between hypervolemia and renal injury with diuresis. Unable to pull fluid from lungs with thoracentesis so medical management is best option. His IV Abx to treat his vertebral osteomyelitis from previous admission continue unchanged and managed by ID.    6/28: Bed offered at [facility]  Authorization pending 7/1: No status changes.  Per patient's daughter he has been seeming more depressed.  I have offered outpatient referral to psychiatry.   7/2: Insurance authorization for LTAC continues to be pending   7/3 : Peer-to-peer review done.  Not a candidate for LTAC.   Approved for skilled nursing facility patient   7/9 : Medically stable and ready for discharge to skilled nursing facility 7/10: No acute events.  Pending insurance authorization   Assessment & Plan:   Principal Problem:   Acute respiratory failure with hypoxia (HCC) Active Problems:   Acute on chronic heart failure with preserved ejection fraction (HFpEF) (HCC)   MSSA bacteremia   Vertebral osteomyelitis (HCC)   CAD (coronary artery disease)   Myocardial injury   Hypertension   Hyperlipidemia   Chronic kidney disease, stage 3a (HCC)   Normocytic anemia   Atrial fibrillation, chronic (HCC)   Paroxysmal atrial fibrillation (HCC)   HCAP (healthcare-associated pneumonia)   Sepsis (HCC)   Pleural effusion   Amiodarone pulmonary toxicity   Acute on chronic HFrEF (heart failure with reduced ejection fraction) (HCC)   Pleural effusion on right   Status post thoracentesis   Heart failure with preserved ejection fraction (HCC)   Stage 3 chronic kidney disease (HCC)  Acute on chronic respiratory failure with hypoxia due to acute on chronic CHF  possible pneumonia, bilateral small pleural effusion- as seen on chest CT Not on oxygen supplementation at baseline Has been weaned off oxygen completely and is on room air - wean as tolerated -Completed course of azithromycin. - Continue Ancef as written for his osteomyelitis.  Last dose 7/12 - incentive spirometer q1hr.  Encourage use - Off steroids - Medically appropriate for dc   Acute on chronic diastolic CHF- Markedly net negative over course of admission.  Appears euvolemic Plan: Lasix on hold due to worsening renal function TED hose Strict I's and O's, daily weights   Pleural effusion- IR unable to perform  thoracentesis 6/20, 6/21. Medical management. Clinically improving respiratory status.      Atrial fibrillation, chronic (HCC): Heart rate now well controlled in 60-70s diltiazem, metoprolol per cardiology Lisinopril held  with AKI Restarted eliquis (renally dosed) in setting of no new bleeding and patient's hgb has been stable >5 days after concern for GI bleed early in admission on heparin gtt.   Recent hx of MSSA bacteremia vertebral osteomyelitis (HCC): Ancef stopped 7/11 Doxycycline 100 PO BID x 6 weeks Remove PICC line 7/12   CAD  Myocardial injury  HLD:  trop  258, possibly due to demand ischemia secondary to CHF exacerbation.  Patient does not have chest pain. Crestor, beta blocker, Aspirin 81 discontinued as he's on eliquis  Cardiology following  May need coronary angiography at some point but will defer for now     AKI on Chronic kidney disease, stage 3a (HCC):  Baseline creatinine 1.5-1.9.  Uptrending creatinine level and has peaked at 3.82 Plan: Discussed with nephrology.  Lasix on hold.  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  Offered kidney biopsy.  Poor candidate at this time.  Deferred outpatient evaluation.   Normocytic anemia:  hgb stable around 8.5-9. S/p 1 unit PRBC 06/18    Hypertension Continue to closely monitor vital signs.   Resolved hypokalemia   GERD PPI    Right knee pain -X-ray with chronic arthritic changes.  Recommend frequent mobility     Hypokalemia Secondary to diuretic therapy.  Supplemented   DVT prophylaxis: Eliquis Code Status: DNR Family Communication: Spouse at bedside 7/10, 7/11, 7/12, 7/13 Disposition Plan: Status is: Inpatient Remains inpatient appropriate because: Pending insurance authorization.  Unsafe discharge plan.   Level of care: Progressive  Consultants:  Nephrology  Procedures:  None  Antimicrobials: Doxycycline   Subjective: Seen and examined.  Wife at bedside.  No acute events  Objective: Vitals:   09/14/22 0039 09/14/22 0352 09/14/22 0839 09/14/22 1147  BP: 118/60 (!) 145/69 (!) 154/77 (!) 155/79  Pulse: 70 68 74 72  Resp: 18 18 20 20   Temp: 98.2 F (36.8 C) 98 F (36.7 C)  98.2 F (36.8 C) 97.9 F (36.6 C)  TempSrc: Oral Oral    SpO2: 97% 92% (!) 89% 97%  Weight:  87.8 kg    Height:        Intake/Output Summary (Last 24 hours) at 09/14/2022 1246 Last data filed at 09/14/2022 0403 Gross per 24 hour  Intake 360 ml  Output 800 ml  Net -440 ml   Filed Weights   09/12/22 0500 09/13/22 0356 09/14/22 0352  Weight: 88 kg 87.8 kg 87.8 kg    Examination:  General exam: NAD Respiratory system: Clear to auscultation. Respiratory effort normal. Cardiovascular system: S1-S2, RRR, no murmurs, no pedal edema Gastrointestinal system: Soft, NT/ND, normal bowel sounds Central nervous system: Alert and oriented. No focal neurological deficits. Extremities: Decreased power bilateral lower extremities Skin: No rashes, lesions or ulcers Psychiatry: Judgement and insight appear normal. Mood & affect appropriate.     Data Reviewed: I have personally reviewed following labs and imaging studies  CBC: Recent Labs  Lab 09/12/22 0755  WBC 7.1  NEUTROABS 4.8  HGB 8.0*  HCT 25.6*  MCV 92.1  PLT 124*   Basic Metabolic Panel: Recent Labs  Lab 09/09/22 1000 09/10/22 0928 09/12/22 0755  NA 138 137 140  K 3.0* 3.9 3.4*  CL 104 103 106  CO2 22 22 23   GLUCOSE 122* 123* 83  BUN  91* 87* 84*  CREATININE 3.56* 3.82* 3.64*  CALCIUM 8.5* 8.3* 8.4*   GFR: Estimated Creatinine Clearance: 16.6 mL/min (A) (by C-G formula based on SCr of 3.64 mg/dL (H)). Liver Function Tests: No results for input(s): "AST", "ALT", "ALKPHOS", "BILITOT", "PROT", "ALBUMIN" in the last 168 hours. No results for input(s): "LIPASE", "AMYLASE" in the last 168 hours. No results for input(s): "AMMONIA" in the last 168 hours. Coagulation Profile: No results for input(s): "INR", "PROTIME" in the last 168 hours. Cardiac Enzymes: No results for input(s): "CKTOTAL", "CKMB", "CKMBINDEX", "TROPONINI" in the last 168 hours. BNP (last 3 results) No results for input(s): "PROBNP" in the last 8760  hours. HbA1C: No results for input(s): "HGBA1C" in the last 72 hours. CBG: No results for input(s): "GLUCAP" in the last 168 hours. Lipid Profile: No results for input(s): "CHOL", "HDL", "LDLCALC", "TRIG", "CHOLHDL", "LDLDIRECT" in the last 72 hours. Thyroid Function Tests: No results for input(s): "TSH", "T4TOTAL", "FREET4", "T3FREE", "THYROIDAB" in the last 72 hours. Anemia Panel: No results for input(s): "VITAMINB12", "FOLATE", "FERRITIN", "TIBC", "IRON", "RETICCTPCT" in the last 72 hours. Sepsis Labs: No results for input(s): "PROCALCITON", "LATICACIDVEN" in the last 168 hours.  No results found for this or any previous visit (from the past 240 hour(s)).       Radiology Studies: DG Chest Port 1 View  Result Date: 09/13/2022 CLINICAL DATA:  Hypoxia EXAM: PORTABLE CHEST 1 VIEW COMPARISON:  09/04/2022 FINDINGS: Mild cardiac enlargement. Bilateral perihilar infiltrates likely edema but could indicate multifocal pneumonia or ARDS. No pleural effusions. No pneumothorax. Bronchial wall thickening and bronchiectasis consistent with chronic bronchitis. Calcification of the aorta. Degenerative changes in the spine and shoulders. Right PICC line has been removed in the interval. Otherwise similar appearance to prior study. IMPRESSION: Cardiac enlargement with perihilar infiltrates, likely edema. Electronically Signed   By: Burman Nieves M.D.   On: 09/13/2022 22:01        Scheduled Meds:  acidophilus  1 capsule Oral Daily   apixaban  2.5 mg Oral BID   Chlorhexidine Gluconate Cloth  6 each Topical Daily   cyanocobalamin  1,000 mcg Oral Daily   diltiazem  360 mg Oral Daily   doxycycline  100 mg Oral Q12H   feeding supplement  237 mL Oral BID BM   metoprolol succinate  100 mg Oral Daily   multivitamins with iron  1 tablet Oral Daily   pantoprazole  40 mg Oral Daily   polyethylene glycol  17 g Oral Daily   rosuvastatin  10 mg Oral Daily   senna  1 tablet Oral Daily   Continuous  Infusions:  sodium chloride 5 mL/hr at 09/11/22 2138     LOS: 27 days     Tresa Moore, MD [facility]   If 7PM-7AM, please contact night-coverage  09/14/2022, 12:46 PM

## 2022-09-14 NOTE — Progress Notes (Signed)
[facility]  ROUNDING NOTE   Subjective:   Patient sitting up in bed Spouse at bedside.   UOP  Creatinine 3.64 (3.82)   Objective:  Vital signs in last 24 hours:  Temp:  [97.9 F (36.6 C)-98.5 F (36.9 C)] 97.9 F (36.6 C) (07/13 1147) Pulse Rate:  [66-74] 72 (07/13 1147) Resp:  [16-20] 20 (07/13 1147) BP: (110-155)/(60-79) 155/79 (07/13 1147) SpO2:  [89 %-97 %] 97 % (07/13 1147) Weight:  [87.8 kg] 87.8 kg (07/13 0352)  Weight change: 0 kg Filed Weights   09/12/22 0500 09/13/22 0356 09/14/22 0352  Weight: 88 kg 87.8 kg 87.8 kg    Intake/Output: I/O last 3 completed shifts: In: 700 [P.O.:700] Out: 2050 [Urine:2050]   Intake/Output this shift:  No intake/output data recorded.  Physical Exam: General: NAD, laying in bed  Head: Normocephalic, atraumatic. Moist oral mucosal membranes  Eyes: Anicteric  Lungs:  Diminished, [place] 2L O2  Heart: Regular rate and rhythm  Abdomen:  Soft, nontender  Extremities:  + peripheral edema.  TED hose  Neurologic: Alert and oriented, moving all four extremities  Skin: No lesions  Access: none    Basic Metabolic Panel: Recent Labs  Lab 09/09/22 1000 09/10/22 0928 09/12/22 0755  NA 138 137 140  K 3.0* 3.9 3.4*  CL 104 103 106  CO2 22 22 23   GLUCOSE 122* 123* 83  BUN 91* 87* 84*  CREATININE 3.56* 3.82* 3.64*  CALCIUM 8.5* 8.3* 8.4*    Liver Function Tests: No results for input(s): "AST", "ALT", "ALKPHOS", "BILITOT", "PROT", "ALBUMIN" in the last 168 hours.  No results for input(s): "LIPASE", "AMYLASE" in the last 168 hours. No results for input(s): "AMMONIA" in the last 168 hours.  CBC: Recent Labs  Lab 09/12/22 0755  WBC 7.1  NEUTROABS 4.8  HGB 8.0*  HCT 25.6*  MCV 92.1  PLT 124*    Cardiac Enzymes: No results for input(s): "CKTOTAL", "CKMB", "CKMBINDEX", "TROPONINI" in the last 168 hours.   BNP: Invalid input(s): "POCBNP"  CBG: No results for input(s): "GLUCAP" in the last 168  hours.   Microbiology: Results for orders placed or performed during the hospital encounter of 08/18/22  Blood culture (routine x 2)     Status: None   Collection Time: 08/18/22 10:45 AM   Specimen: BLOOD LEFT ARM  Result Value Ref Range Status   Specimen Description BLOOD LEFT ARM  Final   Special Requests   Final    BOTTLES DRAWN AEROBIC AND ANAEROBIC Blood Culture adequate volume   Culture   Final    NO GROWTH 5 DAYS Performed at [facility], [address] [place], [place] [address]    Report Status 08/23/2022 FINAL  Final  Resp Panel by RT-PCR (Flu A&B, Covid) Anterior Nasal Swab     Status: None   Collection Time: 08/18/22 10:46 AM   Specimen: Anterior Nasal Swab  Result Value Ref Range Status   SARS Coronavirus 2 by RT PCR NEGATIVE NEGATIVE Final    Comment: (NOTE) SARS-CoV-2 target nucleic acids are NOT DETECTED.  The SARS-CoV-2 RNA is generally detectable in upper respiratory specimens during the acute phase of infection. The lowest concentration of SARS-CoV-2 viral copies this assay can detect is 138 copies/mL. A negative result does not preclude SARS-Cov-2 infection and should not be used as the sole basis for treatment or other patient management decisions. A negative result may occur with  improper specimen collection/handling, submission of specimen other than nasopharyngeal swab, presence  of viral mutation(s) within the areas targeted by this assay, and inadequate number of viral copies(<138 copies/mL). A negative result must be combined with clinical observations, patient history, and epidemiological information. The expected result is Negative.  Fact Sheet for Patients:  BloggerCourse.com  Fact Sheet for Healthcare Providers:  SeriousBroker.it  This test is no t yet approved or cleared by the [place] FDA and  has been authorized for detection and/or diagnosis of SARS-CoV-2 by FDA under an  Emergency Use Authorization (EUA). This EUA will remain  in effect (meaning this test can be used) for the duration of the COVID-19 declaration under Section 564(b)(1) of the Act, 21 U.S.C.section 360bbb-3(b)(1), unless the authorization is terminated  or revoked sooner.       Influenza A by PCR NEGATIVE NEGATIVE Final   Influenza B by PCR NEGATIVE NEGATIVE Final    Comment: (NOTE) The Xpert Xpress SARS-CoV-2/FLU/RSV plus assay is intended as an aid in the diagnosis of influenza from Nasopharyngeal swab specimens and should not be used as a sole basis for treatment. Nasal washings and aspirates are unacceptable for Xpert Xpress SARS-CoV-2/FLU/RSV testing.  Fact Sheet for Patients: BloggerCourse.com  Fact Sheet for Healthcare Providers: SeriousBroker.it  This test is not yet approved or cleared by the [place] FDA and has been authorized for detection and/or diagnosis of SARS-CoV-2 by FDA under an Emergency Use Authorization (EUA). This EUA will remain in effect (meaning this test can be used) for the duration of the COVID-19 declaration under Section 564(b)(1) of the Act, 21 U.S.C. section 360bbb-3(b)(1), unless the authorization is terminated or revoked.  Performed at [facility], [address] [place], [place] [address]   Culture, blood (Routine X 2) w Reflex to ID Panel     Status: None   Collection Time: 08/18/22  9:19 PM   Specimen: BLOOD LEFT ARM  Result Value Ref Range Status   Specimen Description BLOOD LEFT ARM  Final   Special Requests   Final    BOTTLES DRAWN AEROBIC AND ANAEROBIC Blood Culture adequate volume   Culture   Final    NO GROWTH 5 DAYS Performed at [facility], [address] [place], [place] [address]    Report Status 08/23/2022 FINAL  Final  Respiratory (~20 pathogens) panel by PCR     Status: None   Collection Time: 08/21/22  1:50 PM   Specimen: Nasopharyngeal Swab;  Respiratory  Result Value Ref Range Status   Adenovirus NOT DETECTED NOT DETECTED Final   Coronavirus 229E NOT DETECTED NOT DETECTED Final    Comment: (NOTE) The Coronavirus on the Respiratory Panel, DOES NOT test for the novel  Coronavirus (2019 nCoV)    Coronavirus HKU1 NOT DETECTED NOT DETECTED Final   Coronavirus NL63 NOT DETECTED NOT DETECTED Final   Coronavirus OC43 NOT DETECTED NOT DETECTED Final   Metapneumovirus NOT DETECTED NOT DETECTED Final   Rhinovirus / Enterovirus NOT DETECTED NOT DETECTED Final   Influenza A NOT DETECTED NOT DETECTED Final   Influenza B NOT DETECTED NOT DETECTED Final   Parainfluenza Virus 1 NOT DETECTED NOT DETECTED Final   Parainfluenza Virus 2 NOT DETECTED NOT DETECTED Final   Parainfluenza Virus 3 NOT DETECTED NOT DETECTED Final   Parainfluenza Virus 4 NOT DETECTED NOT DETECTED Final   Respiratory Syncytial Virus NOT DETECTED NOT DETECTED Final   Bordetella pertussis NOT DETECTED NOT DETECTED Final   Bordetella Parapertussis NOT DETECTED NOT DETECTED Final   Chlamydophila pneumoniae NOT DETECTED NOT DETECTED Final  Mycoplasma pneumoniae NOT DETECTED NOT DETECTED Final    Comment: Performed at [facility], [address] [place], [place] [address]  Culture, blood (Routine X 2) w Reflex to ID Panel     Status: None   Collection Time: 08/21/22  8:58 PM   Specimen: BLOOD  Result Value Ref Range Status   Specimen Description BLOOD LEFT WRIST  Final   Special Requests   Final    BOTTLES DRAWN AEROBIC AND ANAEROBIC Blood Culture adequate volume   Culture   Final    NO GROWTH 5 DAYS Performed at [facility], [address] [place], [place] [address]    Report Status 08/26/2022 FINAL  Final  Culture, blood (Routine X 2) w Reflex to ID Panel     Status: None   Collection Time: 08/21/22 11:01 PM   Specimen: BLOOD  Result Value Ref Range Status   Specimen Description BLOOD BLOOD LEFT ARM  Final   Special Requests   Final     BOTTLES DRAWN AEROBIC AND ANAEROBIC Blood Culture adequate volume   Culture   Final    NO GROWTH 5 DAYS Performed at [facility], [address] [place], [place] [address]    Report Status 08/26/2022 FINAL  Final  Expectorated Sputum Assessment w Gram Stain, Rflx to Resp Cult     Status: None   Collection Time: 08/22/22 11:22 AM   Specimen: Sputum  Result Value Ref Range Status   Specimen Description SPUTUM  Final   Special Requests EXPSU  Final   Sputum evaluation   Final    THIS SPECIMEN IS ACCEPTABLE FOR SPUTUM CULTURE Performed at [facility], [address], [place], [place] [address]    Report Status 08/22/2022 FINAL  Final  Culture, Respiratory w Gram Stain     Status: None   Collection Time: 08/22/22 11:22 AM   Specimen: SPU  Result Value Ref Range Status   Specimen Description   Final    SPUTUM Performed at [facility], [address] [place], [place] [address]    Special Requests   Final    EXPSU Reflexed from [id number] Performed at [facility], [address] [place], [place] [address]    Gram Stain   Final    ABUNDANT SQUAMOUS EPITHELIAL CELLS PRESENT FEW WBC PRESENT, PREDOMINANTLY PMN ABUNDANT GRAM VARIABLE ROD ABUNDANT GRAM POSITIVE COCCI IN PAIRS    Culture   Final    Normal respiratory flora-no Staph aureus or Pseudomonas seen Performed at [facility], [address] [place], [place] [address]    Report Status 08/25/2022 FINAL  Final    Coagulation Studies: No results for input(s): "LABPROT", "INR" in the last 72 hours.  Urinalysis: No results for input(s): "COLORURINE", "LABSPEC", "PHURINE", "GLUCOSEU", "HGBUR", "BILIRUBINUR", "KETONESUR", "PROTEINUR", "UROBILINOGEN", "NITRITE", "LEUKOCYTESUR" in the last 72 hours.  Invalid input(s): "APPERANCEUR"     Imaging: DG Chest Port 1 View  Result Date: 09/13/2022 CLINICAL DATA:  Hypoxia EXAM: PORTABLE CHEST 1 VIEW COMPARISON:  09/04/2022 FINDINGS: Mild  cardiac enlargement. Bilateral perihilar infiltrates likely edema but could indicate multifocal pneumonia or ARDS. No pleural effusions. No pneumothorax. Bronchial wall thickening and bronchiectasis consistent with chronic bronchitis. Calcification of the aorta. Degenerative changes in the spine and shoulders. Right PICC line has been removed in the interval. Otherwise similar appearance to prior study. IMPRESSION: Cardiac enlargement with perihilar infiltrates, likely edema. Electronically Signed   By: Burman Nieves M.D.   On:  09/13/2022 22:01     Medications:    sodium chloride 5 mL/hr at 09/11/22 2138    acidophilus  1 capsule Oral Daily   apixaban  2.5 mg Oral BID   Chlorhexidine Gluconate Cloth  6 each Topical Daily   cyanocobalamin  1,000 mcg Oral Daily   diltiazem  360 mg Oral Daily   doxycycline  100 mg Oral Q12H   feeding supplement  237 mL Oral BID BM   metoprolol succinate  100 mg Oral Daily   multivitamins with iron  1 tablet Oral Daily   pantoprazole  40 mg Oral Daily   polyethylene glycol  17 g Oral Daily   rosuvastatin  10 mg Oral Daily   senna  1 tablet Oral Daily   sodium chloride, acetaminophen, albuterol, dextromethorphan-guaiFENesin, melatonin, ondansetron (ZOFRAN) IV, simethicone, sodium chloride, traMADol  Assessment/ Plan:  Brett Riley is a 87 y.o.  male with recent prolonged admission for aortic ulcer s/p endovascular repair 07/24/22, MSSA bacteremia with L spine osteomyelitis and discitis, chronic diastolic heart failure, hypertension, hyperlipidemia, coronary artery disease, atrial fibrillation who was admitted to [facility] on 08/18/2022 for HCAP (healthcare-associated pneumonia) [J18.9] Acute on chronic respiratory failure with hypoxia (HCC) [J96.21] Sepsis, due to unspecified organism, unspecified whether acute organ dysfunction present (HCC) [A41.9]     1.  Acute kidney injury with proteinuria and hematuria on baseline creatinine of 1.05 GFR > 60 on  07/02/22.  Secondary to ATN versus AIN from cefazolin. IV contrast exposure on 07/24/22. - nonoliguric urine output - holding diuretics - no indication for dialysis.    2.  Acute on chronic diastolic heart failure. With LE edema  Ejection fraction 55 to 60% with grade 1 diastolic dysfunction noted.  -Stabilized  3.  Anemia with renal failure:     Lab Results  Component Value Date   HGB 8.0 (L) 09/12/2022  Hemoglobin low. Not on ESA.     LOS: 27 Brett Riley 7/13/202412:20 PM

## 2022-09-15 DIAGNOSIS — J9601 Acute respiratory failure with hypoxia: Secondary | ICD-10-CM | POA: Diagnosis not present

## 2022-09-15 MED ORDER — ZINC OXIDE 40 % EX OINT
TOPICAL_OINTMENT | Freq: Three times a day (TID) | CUTANEOUS | Status: DC
  Administered 2022-09-15 – 2022-09-24 (×2): 1 via TOPICAL
  Filled 2022-09-15: qty 113

## 2022-09-15 NOTE — Plan of Care (Signed)
°  Problem: Education: °Goal: Ability to demonstrate management of disease process will improve °Outcome: Progressing °Goal: Ability to verbalize understanding of medication therapies will improve °Outcome: Progressing °Goal: Individualized Educational Video(s) °Outcome: Progressing °  °Problem: Activity: °Goal: Capacity to carry out activities will improve °Outcome: Progressing °  °Problem: Cardiac: °Goal: Ability to achieve and maintain adequate cardiopulmonary perfusion will improve °Outcome: Progressing °  °Problem: Activity: °Goal: Ability to tolerate increased activity will improve °Outcome: Progressing °  °Problem: Clinical Measurements: °Goal: Ability to maintain a body temperature in the normal range will improve °Outcome: Progressing °  °Problem: Respiratory: °Goal: Ability to maintain adequate ventilation will improve °Outcome: Progressing °Goal: Ability to maintain a clear airway will improve °Outcome: Progressing °  °Problem: Education: °Goal: Knowledge of General Education information will improve °Description: Including pain rating scale, medication(s)/side effects and non-pharmacologic comfort measures °Outcome: Progressing °  °Problem: Health Behavior/Discharge Planning: °Goal: Ability to manage health-related needs will improve °Outcome: Progressing °  °Problem: Clinical Measurements: °Goal: Ability to maintain clinical measurements within normal limits will improve °Outcome: Progressing °Goal: Will remain free from infection °Outcome: Progressing °Goal: Diagnostic test results will improve °Outcome: Progressing °Goal: Respiratory complications will improve °Outcome: Progressing °Goal: Cardiovascular complication will be avoided °Outcome: Progressing °  °Problem: Activity: °Goal: Risk for activity intolerance will decrease °Outcome: Progressing °  °Problem: Nutrition: °Goal: Adequate nutrition will be maintained °Outcome: Progressing °  °Problem: Coping: °Goal: Level of anxiety will  decrease °Outcome: Progressing °  °Problem: Elimination: °Goal: Will not experience complications related to bowel motility °Outcome: Progressing °Goal: Will not experience complications related to urinary retention °Outcome: Progressing °  °Problem: Pain Managment: °Goal: General experience of comfort will improve °Outcome: Progressing °  °Problem: Safety: °Goal: Ability to remain free from injury will improve °Outcome: Progressing °  °Problem: Skin Integrity: °Goal: Risk for impaired skin integrity will decrease °Outcome: Progressing °  °

## 2022-09-15 NOTE — TOC Progression Note (Signed)
Transition of Care [facility]) - Progression Note    Patient Details  Name: Brett Riley MRN: [id number] Date of Birth: 05-26-1935  Transition of Care [facility] CM/SW Contact  Bing Quarry, RN Phone Number: 09/15/2022, 12:05 PM  Clinical Narrative: 7/14: Checked Availity for insurance authorization status and it remains pending at 1205 pm 09/15/22. Updated provider.  Gabriel Cirri MSN RN CM  Transitions of Care Department [facility] [phone] Weekends Only       Expected Discharge Plan: Skilled Nursing Facility Barriers to Discharge: Continued Medical Work up, English as a second language teacher  Expected Discharge Plan and Services     Post Acute Care Choice: Skilled Nursing Facility Living arrangements for the past 2 months: Single Family Home                                       Social Determinants of Health (SDOH) Interventions SDOH Screenings   Food Insecurity: No Food Insecurity (08/18/2022)  Housing: Low Risk  (08/18/2022)  Transportation Needs: No Transportation Needs (08/18/2022)  Utilities: Not At Risk (08/18/2022)  Depression (PHQ2-9): Low Risk  (06/06/2022)  Financial Resource Strain: Low Risk  (08/29/2020)  Physical Activity: Insufficiently Active (08/29/2020)  Social Connections: Unknown (08/29/2020)  Stress: No Stress Concern Present (08/29/2020)  Tobacco Use: Low Risk  (09/05/2022)    Readmission Risk Interventions     No data to display

## 2022-09-15 NOTE — Progress Notes (Signed)
PROGRESS NOTE    Brett Riley  [id number]:[id number] DOB: [birth year] DOA: 08/18/2022 PCP: Dale Ehrenberg, MD    Brief Narrative:  87 y.o. male with medical history significant of chronic dCHF, HTN, HLD, CAD, CKD-3a, anemia, chronic A fib on Eliquis, who presents 08/18/2022 to ED from rehab, chief complaint SOB.  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 Net IO Since Admission: -5,549.14 mL [08/20/22 1608]. Heparin stopped d/t rectal bleeding overnight.  06/19: euvolemic per cardio, d/c lasix, O2 requirement still high, CT chest reviewed, ?edema/atypical infection, given clinically worsening respiratory status have asked pulmonary and ID to consult. Have d/c amiodarone and ordered steroids in case amio toxicity.  06/20: ID and pulmonary assisting with the management.   6/22-6/25: patient feels clinically improved. Stable with oxygen via [place] and weaned down to 2-3L. Patient requiring judicial adjustments to balance between hypervolemia and renal injury with diuresis. Unable to pull fluid from lungs with thoracentesis so medical management is best option. His IV Abx to treat his vertebral osteomyelitis from previous admission continue unchanged and managed by ID.    6/28: Bed offered at [facility].  Authorization pending 7/1: No status changes.  Per patient's daughter he has been seeming more depressed.  I have offered outpatient referral to psychiatry.   7/2: Insurance authorization for LTAC continues to be pending   7/3 : Peer-to-peer review done.  Not a candidate for LTAC.   Approved for skilled nursing facility patient   7/9 : Medically stable and ready for discharge to skilled nursing facility 7/10: No acute events.  Pending insurance authorization   Assessment & Plan:   Principal Problem:   Acute respiratory failure with hypoxia (HCC) Active Problems:   Acute on chronic heart failure with preserved ejection fraction (HFpEF) (HCC)   MSSA bacteremia   Vertebral osteomyelitis (HCC)   CAD (coronary artery disease)   Myocardial injury   Hypertension   Hyperlipidemia   Chronic kidney disease, stage 3a (HCC)   Normocytic anemia   Atrial fibrillation, chronic (HCC)   Paroxysmal atrial fibrillation (HCC)   HCAP (healthcare-associated pneumonia)   Sepsis (HCC)   Pleural effusion   Amiodarone pulmonary toxicity   Acute on chronic HFrEF (heart failure with reduced ejection fraction) (HCC)   Pleural effusion on right   Status post thoracentesis   Heart failure with preserved ejection fraction (HCC)   Stage 3 chronic kidney disease (HCC)  Acute on chronic respiratory failure with hypoxia due to acute on chronic CHF  possible pneumonia, bilateral small pleural effusion- as seen on chest CT Not on oxygen supplementation at baseline Has been weaned off oxygen completely and is on room air - wean as tolerated -Completed course of azithromycin. -Currently on doxycycline p.o.   Acute on chronic diastolic CHF- Markedly net negative over course of admission.  Appears euvolemic Plan: Lasix on hold due to worsening renal function TED hose Strict I's and O's, daily weights   Pleural effusion- IR unable to perform thoracentesis 6/20, 6/21. Medical management. Clinically improving respiratory status.      Atrial fibrillation, chronic (HCC): Heart rate now well controlled  in 60-70s diltiazem, metoprolol per cardiology Lisinopril held with AKI Restarted eliquis (renally dosed) in setting of no new bleeding and patient's hgb has been stable >5 days after concern  for GI bleed early in admission on heparin gtt.   Recent hx of MSSA bacteremia vertebral osteomyelitis (HCC): Ancef stopped 7/11 Doxycycline 100 PO BID x 6 weeks Remove PICC line 7/12   CAD  Myocardial injury  HLD:  trop  258, possibly due to demand ischemia secondary to CHF exacerbation.  Patient does not have chest pain. Crestor, beta blocker, Aspirin 81 discontinued as he's on eliquis  Cardiology following  May need coronary angiography at some point but will defer for now     AKI on Chronic kidney disease, stage 3a (HCC):  Baseline creatinine 1.5-1.9.  Uptrending creatinine level and has peaked at 3.82 Plan: Discussed with nephrology.  Lasix on hold.  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  Offered kidney biopsy.  Poor candidate at this time.  Deferred outpatient evaluation.   Normocytic anemia:  hgb stable around 8.5-9. S/p 1 unit PRBC 06/18    Hypertension Continue to closely monitor vital signs.   Resolved hypokalemia   GERD PPI    Right knee pain -X-ray with chronic arthritic changes.  Recommend frequent mobility     Hypokalemia Secondary to diuretic therapy.  Supplemented   DVT prophylaxis: Eliquis Code Status: DNR Family Communication: Spouse at bedside 7/10, 7/11, 7/12, 7/13, 7/14 Disposition Plan: Status is: Inpatient Remains inpatient appropriate because: Pending insurance authorization.  Unsafe discharge plan.   Level of care: Progressive  Consultants:  Nephrology  Procedures:  None  Antimicrobials: Doxycycline   Subjective: Seen and examined.  Wife at bedside.  No acute events  Objective: Vitals:   09/14/22 2018 09/15/22 0005 09/15/22 0421 09/15/22 0743  BP: 139/70 132/78 (!) 142/71 (!) 144/71  Pulse: 74 72 77 75  Resp: 18  16 18   Temp: 98.8 F (37.1 C) 98.6 F (37 C) 97.8 F (36.6 C) 98.2 F (36.8 C)  TempSrc: Oral Oral    SpO2: 94% 95% 94% 92%  Weight:      Height:        Intake/Output  Summary (Last 24 hours) at 09/15/2022 1122 Last data filed at 09/15/2022 0700 Gross per 24 hour  Intake 560 ml  Output 1600 ml  Net -1040 ml   Filed Weights   09/12/22 0500 09/13/22 0356 09/14/22 0352  Weight: 88 kg 87.8 kg 87.8 kg    Examination:  General exam: No acute distress Respiratory system: Scattered crackles bilaterally.  Normal work of breathing.  2 L Cardiovascular system: S1-S2, RRR, no murmurs, no pedal edema Gastrointestinal system: Soft, NT/ND, normal bowel sounds Central nervous system: Alert and oriented. No focal neurological deficits. Extremities: Decreased power bilateral lower extremities Skin: No rashes, lesions or ulcers Psychiatry: Judgement and insight appear normal. Mood & affect appropriate.     Data Reviewed: I have personally reviewed following labs and imaging studies  CBC: Recent Labs  Lab 09/12/22 0755  WBC 7.1  NEUTROABS 4.8  HGB 8.0*  HCT 25.6*  MCV 92.1  PLT 124*   Basic Metabolic Panel: Recent Labs  Lab 09/09/22 1000 09/10/22 0928 09/12/22 0755  NA 138 137 140  K 3.0* 3.9 3.4*  CL 104 103 106  CO2 22 22 23   GLUCOSE 122* 123* 83  BUN 91* 87* 84*  CREATININE 3.56* 3.82* 3.64*  CALCIUM 8.5* 8.3* 8.4*   GFR: Estimated Creatinine  Clearance: 16.6 mL/min (A) (by C-G formula based on SCr of 3.64 mg/dL (H)). Liver Function Tests: No results for input(s): "AST", "ALT", "ALKPHOS", "BILITOT", "PROT", "ALBUMIN" in the last 168 hours. No results for input(s): "LIPASE", "AMYLASE" in the last 168 hours. No results for input(s): "AMMONIA" in the last 168 hours. Coagulation Profile: No results for input(s): "INR", "PROTIME" in the last 168 hours. Cardiac Enzymes: No results for input(s): "CKTOTAL", "CKMB", "CKMBINDEX", "TROPONINI" in the last 168 hours. BNP (last 3 results) No results for input(s): "PROBNP" in the last 8760 hours. HbA1C: No results for input(s): "HGBA1C" in the last 72 hours. CBG: No results for input(s): "GLUCAP"  in the last 168 hours. Lipid Profile: No results for input(s): "CHOL", "HDL", "LDLCALC", "TRIG", "CHOLHDL", "LDLDIRECT" in the last 72 hours. Thyroid Function Tests: No results for input(s): "TSH", "T4TOTAL", "FREET4", "T3FREE", "THYROIDAB" in the last 72 hours. Anemia Panel: No results for input(s): "VITAMINB12", "FOLATE", "FERRITIN", "TIBC", "IRON", "RETICCTPCT" in the last 72 hours. Sepsis Labs: No results for input(s): "PROCALCITON", "LATICACIDVEN" in the last 168 hours.  No results found for this or any previous visit (from the past 240 hour(s)).       Radiology Studies: DG Chest Port 1 View  Result Date: 09/13/2022 CLINICAL DATA:  Hypoxia EXAM: PORTABLE CHEST 1 VIEW COMPARISON:  09/04/2022 FINDINGS: Mild cardiac enlargement. Bilateral perihilar infiltrates likely edema but could indicate multifocal pneumonia or ARDS. No pleural effusions. No pneumothorax. Bronchial wall thickening and bronchiectasis consistent with chronic bronchitis. Calcification of the aorta. Degenerative changes in the spine and shoulders. Right PICC line has been removed in the interval. Otherwise similar appearance to prior study. IMPRESSION: Cardiac enlargement with perihilar infiltrates, likely edema. Electronically Signed   By: Burman Nieves M.D.   On: 09/13/2022 22:01        Scheduled Meds:  acidophilus  1 capsule Oral Daily   apixaban  2.5 mg Oral BID   Chlorhexidine Gluconate Cloth  6 each Topical Daily   cyanocobalamin  1,000 mcg Oral Daily   diltiazem  360 mg Oral Daily   doxycycline  100 mg Oral Q12H   feeding supplement  237 mL Oral BID BM   liver oil-zinc oxide   Topical TID   metoprolol succinate  100 mg Oral Daily   multivitamins with iron  1 tablet Oral Daily   pantoprazole  40 mg Oral Daily   polyethylene glycol  17 g Oral Daily   rosuvastatin  10 mg Oral Daily   senna  1 tablet Oral Daily   Continuous Infusions:  sodium chloride 5 mL/hr at 09/11/22 2138     LOS: 28 days      Tresa Moore, MD [facility]   If 7PM-7AM, please contact night-coverage  09/15/2022, 11:22 AM

## 2022-09-16 ENCOUNTER — Inpatient Hospital Stay: Payer: Medicare HMO

## 2022-09-16 DIAGNOSIS — J9601 Acute respiratory failure with hypoxia: Secondary | ICD-10-CM | POA: Diagnosis not present

## 2022-09-16 LAB — CBC WITH DIFFERENTIAL/PLATELET
Abs Immature Granulocytes: 0.07 10*3/uL (ref 0.00–0.07)
Basophils Absolute: 0 10*3/uL (ref 0.0–0.1)
Basophils Relative: 0 %
Eosinophils Absolute: 0.4 10*3/uL (ref 0.0–0.5)
Eosinophils Relative: 5 %
HCT: 25.7 % — ABNORMAL LOW (ref 39.0–52.0)
Hemoglobin: 8.3 g/dL — ABNORMAL LOW (ref 13.0–17.0)
Immature Granulocytes: 1 %
Lymphocytes Relative: 31 %
Lymphs Abs: 2.4 10*3/uL (ref 0.7–4.0)
MCH: 29.5 pg (ref 26.0–34.0)
MCHC: 32.3 g/dL (ref 30.0–36.0)
MCV: 91.5 fL (ref 80.0–100.0)
Monocytes Absolute: 0.3 10*3/uL (ref 0.1–1.0)
Monocytes Relative: 3 %
Neutro Abs: 4.7 10*3/uL (ref 1.7–7.7)
Neutrophils Relative %: 60 %
Platelets: 141 10*3/uL — ABNORMAL LOW (ref 150–400)
RBC: 2.81 MIL/uL — ABNORMAL LOW (ref 4.22–5.81)
RDW: 16.1 % — ABNORMAL HIGH (ref 11.5–15.5)
WBC: 7.9 10*3/uL (ref 4.0–10.5)
nRBC: 0 % (ref 0.0–0.2)

## 2022-09-16 LAB — BASIC METABOLIC PANEL WITH GFR
Anion gap: 10 (ref 5–15)
BUN: 84 mg/dL — ABNORMAL HIGH (ref 8–23)
CO2: 24 mmol/L (ref 22–32)
Calcium: 8.8 mg/dL — ABNORMAL LOW (ref 8.9–10.3)
Chloride: 108 mmol/L (ref 98–111)
Creatinine, Ser: 3.71 mg/dL — ABNORMAL HIGH (ref 0.61–1.24)
GFR, Estimated: 15 mL/min — ABNORMAL LOW
Glucose, Bld: 97 mg/dL (ref 70–99)
Potassium: 3.5 mmol/L (ref 3.5–5.1)
Sodium: 142 mmol/L (ref 135–145)

## 2022-09-16 MED ORDER — FUROSEMIDE 10 MG/ML IJ SOLN: 40.0000 mg | Freq: Once | INTRAMUSCULAR | Status: DC

## 2022-09-16 MED ORDER — BISACODYL 10 MG RE SUPP: 10.0000 mg | Freq: Every day | RECTAL | Status: DC | PRN

## 2022-09-16 MED ORDER — FUROSEMIDE 10 MG/ML IJ SOLN
40.0000 mg | Freq: Once | INTRAMUSCULAR | Status: AC
  Administered 2022-09-16: 40 mg via INTRAVENOUS
  Filled 2022-09-16: qty 4

## 2022-09-16 MED ORDER — SENNOSIDES-DOCUSATE SODIUM 8.6-50 MG PO TABS
1.0000 | ORAL_TABLET | Freq: Two times a day (BID) | ORAL | Status: DC
  Administered 2022-09-16 – 2022-09-24 (×15): 1 via ORAL
  Filled 2022-09-16 (×17): qty 1

## 2022-09-16 NOTE — Progress Notes (Signed)
PROGRESS NOTE    Brett Riley  KZS:[id number] DOB: 1935-07-18 DOA: 08/18/2022 PCP: Dale [employer], MD    Brief Narrative:  87 y.o. male with medical history significant of chronic dCHF, HTN, HLD, CAD, CKD-3a, anemia, chronic A fib on Eliquis, who presents 08/18/2022 to ED from rehab, chief complaint SOB.  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 Net IO Since Admission: -5,549.14 mL [08/20/22 1608]. Heparin stopped d/t rectal bleeding overnight.  06/19: euvolemic per cardio, d/c lasix, O2 requirement still high, CT chest reviewed, ?edema/atypical infection, given clinically worsening respiratory status have asked pulmonary and ID to consult. Have d/c amiodarone and ordered steroids in case amio toxicity.  06/20: ID and pulmonary assisting with the management.   6/22-6/25: patient feels clinically improved. Stable with oxygen via [place] and weaned down to 2-3L. Patient requiring judicial adjustments to balance between hypervolemia and renal injury with diuresis. Unable to pull fluid from lungs with thoracentesis so medical management is best option. His IV Abx to treat his vertebral osteomyelitis from previous admission continue unchanged and managed by ID.    6/28: Bed offered at [facility].  Authorization pending 7/1: No status changes.  Per patient's daughter he has been seeming more depressed.  I have offered outpatient referral to psychiatry.   7/2: Insurance authorization for LTAC continues to be pending   7/3 : Peer-to-peer review done.  Not a candidate for LTAC.   Approved for skilled nursing facility patient   7/9 : Medically stable and ready for discharge to skilled nursing facility 7/10: No acute events.  Pending insurance authorization  7/15: Acute hypoxia noted this morning.  Placed on high flow nasal cannula.  Pulmonary edema noted on check chest x-ray.  Lasix restarted.   Assessment & Plan:   Principal Problem:   Acute respiratory failure with hypoxia (HCC) Active Problems:   Acute on chronic heart failure with preserved ejection fraction (HFpEF) (HCC)   MSSA bacteremia   Vertebral osteomyelitis (HCC)   CAD (coronary artery disease)   Myocardial injury   Hypertension   Hyperlipidemia   Chronic kidney disease, stage 3a (HCC)   Normocytic anemia   Atrial fibrillation, chronic (HCC)   Paroxysmal atrial fibrillation (HCC)   HCAP (healthcare-associated pneumonia)   Sepsis (HCC)   Pleural effusion   Amiodarone pulmonary toxicity   Acute on chronic HFrEF (heart failure with reduced ejection fraction) (HCC)   Pleural effusion on right   Status post thoracentesis   Heart failure with preserved ejection fraction (HCC)   Stage 3 chronic kidney disease (HCC)  Acute on chronic respiratory failure with hypoxia due to acute on chronic CHF  possible pneumonia, bilateral small pleural effusion- as seen on chest CT Not on oxygen supplementation at baseline Was previously on week room air.  Oxygen saturation worsened on 7/15 Plan: Lasix 40 mg IV x 1 Consider repeat Lasix dosing versus BiPAP if unable to wean from oxygen Wean oxygen as tolerated  Acute on chronic diastolic CHF- Markedly net negative over course of admission.  Fluid retention worsened on 7/15 Plan: Lasix started TED hose Strict I's and O's,  daily weights   Pleural effusion- IR unable to perform thoracentesis 6/20, 6/21. Medical management. Clinically improving respiratory status.      Atrial fibrillation, chronic (HCC): Heart rate now well controlled in 60-70s diltiazem,  metoprolol per cardiology Lisinopril held with AKI Restarted eliquis (renally dosed) in setting of no new bleeding and patient's hgb has been stable >5 days after concern for GI bleed early in admission on heparin gtt.   Recent hx of MSSA bacteremia vertebral osteomyelitis (HCC): Ancef stopped 7/11 Doxycycline 100 PO BID x 6 weeks Remove PICC line 7/12   CAD  Myocardial injury  HLD:  trop  258, possibly due to demand ischemia secondary to CHF exacerbation.  Patient does not have chest pain. Crestor, beta blocker, Aspirin 81 discontinued as he's on eliquis  Cardiology following  May need coronary angiography at some point but will defer for now     AKI on Chronic kidney disease, stage 3a (HCC):  Baseline creatinine 1.5-1.9.  Uptrending creatinine level and has peaked at 3.82 Plan: Discussed with nephrology.  Lasix on hold.  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  Offered kidney biopsy.  Poor candidate at this time.  Deferred outpatient evaluation.   Normocytic anemia:  hgb stable around 8.5-9. S/p 1 unit PRBC 06/18    Hypertension Continue to closely monitor vital signs.   Resolved hypokalemia   GERD PPI    Right knee pain -X-ray with chronic arthritic changes.  Recommend frequent mobility     Hypokalemia Secondary to diuretic therapy.  Supplemented   DVT prophylaxis: Eliquis Code Status: DNR Family Communication: Spouse at bedside 7/10, 7/11, 7/12, 7/13, 7/14, 7/15 Disposition Plan: Status is: Inpatient Remains inpatient appropriate because: Recurrent hypoxia   Level of care: Progressive  Consultants:  Nephrology  Procedures:  None  Antimicrobials: Doxycycline   Subjective: Seen and examined.  Wife bedside.  More hypoxic this morning.  Slightly increased work of breathing.  Objective: Vitals:   09/16/22 0735 09/16/22 0744 09/16/22 0847 09/16/22 1010  BP:   131/65 (!) 148/72  Pulse:   77 80  Resp:   20 (!) 21   Temp:   98.5 F (36.9 C) 98.1 F (36.7 C)  TempSrc:    Oral  SpO2: 90% 95% 97% 98%  Weight:      Height:        Intake/Output Summary (Last 24 hours) at 09/16/2022 1258 Last data filed at 09/15/2022 2300 Gross per 24 hour  Intake --  Output 700 ml  Net -700 ml   Filed Weights   09/13/22 0356 09/14/22 0352 09/16/22 0300  Weight: 87.8 kg 87.8 kg 90 kg    Examination:  General exam: Appears fatigued Respiratory system: Diffuse rhonchi bilaterally.  Normal work of breathing.  10 L Cardiovascular system: S1-S2, RRR, no murmurs, no pedal edema Gastrointestinal system: Soft, NT/ND, normal bowel sounds Central nervous system: Alert and oriented. No focal neurological deficits. Extremities: Decreased power bilateral lower extremities Skin: No rashes, lesions or ulcers Psychiatry: Judgement and insight appear normal. Mood & affect appropriate.     Data Reviewed: I have personally reviewed following labs and imaging studies  CBC: Recent Labs  Lab 09/12/22 0755 09/16/22 0814  WBC 7.1 7.9  NEUTROABS 4.8 4.7  HGB 8.0* 8.3*  HCT 25.6* 25.7*  MCV 92.1 91.5  PLT 124* 141*   Basic Metabolic Panel: Recent Labs  Lab 09/10/22 0928 09/12/22 0755 09/16/22 0814  NA 137 140 142  K 3.9 3.4* 3.5  CL 103 106 108  CO2 22 23 24   GLUCOSE 123* 83 97  BUN 87* 84* 84*  CREATININE 3.82* 3.64* 3.71*  CALCIUM 8.3* 8.4* 8.8*   GFR: Estimated Creatinine Clearance: 16.3 mL/min (A) (by C-G formula based on SCr of 3.71 mg/dL (H)). Liver Function Tests: No results for input(s): "AST", "ALT", "ALKPHOS", "BILITOT", "PROT", "ALBUMIN" in the last 168 hours. No results for input(s): "LIPASE", "AMYLASE" in the last 168 hours. No results for input(s): "AMMONIA" in the last 168 hours. Coagulation Profile: No results for input(s): "INR", "PROTIME" in the last 168 hours. Cardiac Enzymes: No results for input(s): "CKTOTAL", "CKMB", "CKMBINDEX", "TROPONINI" in the last 168 hours. BNP (last 3  results) No results for input(s): "PROBNP" in the last 8760 hours. HbA1C: No results for input(s): "HGBA1C" in the last 72 hours. CBG: No results for input(s): "GLUCAP" in the last 168 hours. Lipid Profile: No results for input(s): "CHOL", "HDL", "LDLCALC", "TRIG", "CHOLHDL", "LDLDIRECT" in the last 72 hours. Thyroid Function Tests: No results for input(s): "TSH", "T4TOTAL", "FREET4", "T3FREE", "THYROIDAB" in the last 72 hours. Anemia Panel: No results for input(s): "VITAMINB12", "FOLATE", "FERRITIN", "TIBC", "IRON", "RETICCTPCT" in the last 72 hours. Sepsis Labs: No results for input(s): "PROCALCITON", "LATICACIDVEN" in the last 168 hours.  No results found for this or any previous visit (from the past 240 hour(s)).       Radiology Studies: DG Chest Port 1 View  Result Date: 09/16/2022 CLINICAL DATA:  Hypoxia EXAM: PORTABLE CHEST 1 VIEW COMPARISON:  09/13/2022 FINDINGS: Cardiomegaly. Worsened interstitial and alveolar density diffusely. Findings could be due to progressive pneumonia or progressive edema. IMPRESSION: Worsened interstitial and alveolar density diffusely. Findings could be due to progressive pneumonia or progressive edema. Electronically Signed   By: Paulina Fusi M.D.   On: 09/16/2022 11:18        Scheduled Meds:  acidophilus  1 capsule Oral Daily   apixaban  2.5 mg Oral BID   Chlorhexidine Gluconate Cloth  6 each Topical Daily   cyanocobalamin  1,000 mcg Oral Daily   diltiazem  360 mg Oral Daily   doxycycline  100 mg Oral Q12H   feeding supplement  237 mL Oral BID BM   liver oil-zinc oxide   Topical TID   metoprolol succinate  100 mg Oral Daily   multivitamins with iron  1 tablet Oral Daily   pantoprazole  40 mg Oral Daily   polyethylene glycol  17 g Oral Daily   rosuvastatin  10 mg Oral Daily   senna  1 tablet Oral Daily   Continuous Infusions:  sodium chloride 5 mL/hr at 09/11/22 2138     LOS: 29 days     Tresa Moore, MD [facility]   If 7PM-7AM, please contact night-coverage  09/16/2022, 12:58 PM

## 2022-09-16 NOTE — Progress Notes (Signed)
Occupational Therapy Treatment Patient Details Name: Brett Riley MRN: [id number] DOB: January 14, 1936 Today's Date: 09/16/2022   History of present illness Brett Riley is a 87 y/o M comes to [facility] 6/16 with respiratory failure; PMH includes MI, CHF, a-fib, HTN, anemia. 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   OT comments  Upon entering the room, pt supine in bed with wife present in room. RN present to educate pt and family on new medical concerns. Pt is agreeable to participate in OT intervention this session. Pt needing +2 assistance for supine > EOB. Pt needing mod - max A for static sitting balance with L lateral lean. While seated pt performed 5 bouts with incentive spirometer with cuing for technique. Pt started session on 10s O2 via HFNC. Pt reports SOB with O2 saturation decreased to 73% and pt placed on 13Ls to return to 90% or higher. Total A of 2 to return to bed and pt placed into chair position. Pt encouraged to move body while in bed. Chair alarm activated and call bell within reach.   Recommendations for follow up therapy are one component of a multi-disciplinary discharge planning process, led by the attending physician.  Recommendations may be updated based on patient status, additional functional criteria and insurance authorization.    Assistance Recommended at Discharge Frequent or constant Supervision/Assistance  Patient can return home with the following  Two people to help with walking and/or transfers;A lot of help with bathing/dressing/bathroom;Assistance with cooking/housework;Direct supervision/assist for medications management;Direct supervision/assist for financial management;Assist for transportation;Help with stairs or ramp for entrance   Equipment Recommendations  Other (comment) (defer to next venue  of care)       Precautions / Restrictions Precautions Precautions: Fall       Mobility Bed Mobility Overal bed mobility: Needs Assistance       Supine to sit: Max assist, +2 for physical assistance Sit to supine: Total assist, +2 for physical assistance        Transfers                   General transfer comment: unable to attempt secondary to increased O2 need during session     Balance Overall balance assessment: Needs assistance Sitting-balance support: Feet supported, Bilateral upper extremity supported Sitting balance-Leahy Scale: Poor   Postural control: Left lateral lean                                 ADL either performed or assessed with clinical judgement    Extremity/Trunk Assessment Upper Extremity Assessment Upper Extremity Assessment: Generalized weakness   Lower Extremity Assessment Lower Extremity Assessment: Generalized weakness        Vision Patient Visual Report: No change from baseline            Cognition Arousal/Alertness: Awake/alert Behavior During Therapy: WFL for tasks assessed/performed, Impulsive Overall Cognitive Status: Within Functional Limits for tasks assessed                                                     Pertinent Vitals/ Pain       Pain Assessment Pain Assessment: Faces  Faces Pain Scale: Hurts little more Pain Location: low back Pain Descriptors / Indicators: Guarding, Discomfort Pain Intervention(s): Limited activity within patient's tolerance, Repositioned, Monitored during session         Frequency  Min 1X/week        Progress Toward Goals  OT Goals(current goals can now be found in the care plan section)  Progress towards OT goals: Progressing toward goals     Plan Frequency remains appropriate;Discharge plan needs to be updated       AM-PAC OT "6 Clicks" Daily Activity     Outcome Measure   Help from another person eating meals?: A Little Help  from another person taking care of personal grooming?: A Little Help from another person toileting, which includes using toliet, bedpan, or urinal?: Total Help from another person bathing (including washing, rinsing, drying)?: Total Help from another person to put on and taking off regular upper body clothing?: A Lot Help from another person to put on and taking off regular lower body clothing?: Total 6 Click Score: 11    End of Session Equipment Utilized During Treatment: Oxygen (13Ls)  OT Visit Diagnosis: Unsteadiness on feet (R26.81);Muscle weakness (generalized) (M62.81)   Activity Tolerance Patient limited by fatigue;Treatment limited secondary to medical complications (Comment);Other (comment) (O2 requirement increase)   Patient Left with call bell/phone within reach;in chair;with chair alarm set   Nurse Communication Mobility status        Time: 4696-2952 OT Time Calculation (min): 17 min  Charges: OT General Charges $OT Visit: 1 Visit OT Treatments $Therapeutic Activity: 8-22 mins  Jackquline Denmark, MS, OTR/L , CBIS ascom [phone]  09/16/22, 3:16 PM

## 2022-09-16 NOTE — Progress Notes (Signed)
[facility]  ROUNDING NOTE   Subjective:   Hypoxic, placed on HFNC this morning.      Objective:  Vital signs in last 24 hours:  Temp:  [97.9 F (36.6 C)-98.5 F (36.9 C)] 98.1 F (36.7 C) (07/15 1010) Pulse Rate:  [68-80] 80 (07/15 1010) Resp:  [17-21] 21 (07/15 1010) BP: (114-148)/(63-77) 148/72 (07/15 1010) SpO2:  [74 %-98 %] 98 % (07/15 1010) Weight:  [90 kg] 90 kg (07/15 0300)  Weight change:  Filed Weights   09/13/22 0356 09/14/22 0352 09/16/22 0300  Weight: 87.8 kg 87.8 kg 90 kg    Intake/Output: I/O last 3 completed shifts: In: 320 [P.O.:320] Out: 1675 [Urine:1675]   Intake/Output this shift:  No intake/output data recorded.  Physical Exam: General: NAD, laying in bed  Head: Normocephalic, atraumatic. Moist oral mucosal membranes  Eyes: Anicteric  Lungs:  Bilateral crackles, HFNC  Heart: Regular rate and rhythm  Abdomen:  +distended, nontender  Extremities:  + peripheral edema.  TED hose  Neurologic: Alert and oriented, moving all four extremities  Skin: No lesions  Access: none    Basic Metabolic Panel: Recent Labs  Lab 09/10/22 0928 09/12/22 0755 09/16/22 0814  NA 137 140 142  K 3.9 3.4* 3.5  CL 103 106 108  CO2 22 23 24   GLUCOSE 123* 83 97  BUN 87* 84* 84*  CREATININE 3.82* 3.64* 3.71*  CALCIUM 8.3* 8.4* 8.8*    Liver Function Tests: No results for input(s): "AST", "ALT", "ALKPHOS", "BILITOT", "PROT", "ALBUMIN" in the last 168 hours.  No results for input(s): "LIPASE", "AMYLASE" in the last 168 hours. No results for input(s): "AMMONIA" in the last 168 hours.  CBC: Recent Labs  Lab 09/12/22 0755 09/16/22 0814  WBC 7.1 7.9  NEUTROABS 4.8 4.7  HGB 8.0* 8.3*  HCT 25.6* 25.7*  MCV 92.1 91.5  PLT 124* 141*    Cardiac Enzymes: No results for input(s): "CKTOTAL", "CKMB", "CKMBINDEX", "TROPONINI" in the last 168 hours.   BNP: Invalid input(s): "POCBNP"  CBG: No results for input(s): "GLUCAP" in the last 168  hours.   Microbiology: Results for orders placed or performed during the hospital encounter of 08/18/22  Blood culture (routine x 2)     Status: None   Collection Time: 08/18/22 10:45 AM   Specimen: BLOOD LEFT ARM  Result Value Ref Range Status   Specimen Description BLOOD LEFT ARM  Final   Special Requests   Final    BOTTLES DRAWN AEROBIC AND ANAEROBIC Blood Culture adequate volume   Culture   Final    NO GROWTH 5 DAYS Performed at [facility], [address] [place], [place] [address]    Report Status 08/23/2022 FINAL  Final  Resp Panel by RT-PCR (Flu A&B, Covid) Anterior Nasal Swab     Status: None   Collection Time: 08/18/22 10:46 AM   Specimen: Anterior Nasal Swab  Result Value Ref Range Status   SARS Coronavirus 2 by RT PCR NEGATIVE NEGATIVE Final    Comment: (NOTE) SARS-CoV-2 target nucleic acids are NOT DETECTED.  The SARS-CoV-2 RNA is generally detectable in upper respiratory specimens during the acute phase of infection. The lowest concentration of SARS-CoV-2 viral copies this assay can detect is 138 copies/mL. A negative result does not preclude SARS-Cov-2 infection and should not be used as the sole basis for treatment or other patient management decisions. A negative result may occur with  improper specimen collection/handling, submission of specimen other than nasopharyngeal swab, presence of  viral mutation(s) within the areas targeted by this assay, and inadequate number of viral copies(<138 copies/mL). A negative result must be combined with clinical observations, patient history, and epidemiological information. The expected result is Negative.  Fact Sheet for Patients:  BloggerCourse.com  Fact Sheet for Healthcare Providers:  SeriousBroker.it  This test is no t yet approved or cleared by the [place] FDA and  has been authorized for detection and/or diagnosis of SARS-CoV-2 by FDA under an  Emergency Use Authorization (EUA). This EUA will remain  in effect (meaning this test can be used) for the duration of the COVID-19 declaration under Section 564(b)(1) of the Act, 21 U.S.C.section 360bbb-3(b)(1), unless the authorization is terminated  or revoked sooner.       Influenza A by PCR NEGATIVE NEGATIVE Final   Influenza B by PCR NEGATIVE NEGATIVE Final    Comment: (NOTE) The Xpert Xpress SARS-CoV-2/FLU/RSV plus assay is intended as an aid in the diagnosis of influenza from Nasopharyngeal swab specimens and should not be used as a sole basis for treatment. Nasal washings and aspirates are unacceptable for Xpert Xpress SARS-CoV-2/FLU/RSV testing.  Fact Sheet for Patients: BloggerCourse.com  Fact Sheet for Healthcare Providers: SeriousBroker.it  This test is not yet approved or cleared by the [place] FDA and has been authorized for detection and/or diagnosis of SARS-CoV-2 by FDA under an Emergency Use Authorization (EUA). This EUA will remain in effect (meaning this test can be used) for the duration of the COVID-19 declaration under Section 564(b)(1) of the Act, 21 U.S.C. section 360bbb-3(b)(1), unless the authorization is terminated or revoked.  Performed at [facility], [address] [place], [place] [address]   Culture, blood (Routine X 2) w Reflex to ID Panel     Status: None   Collection Time: 08/18/22  9:19 PM   Specimen: BLOOD LEFT ARM  Result Value Ref Range Status   Specimen Description BLOOD LEFT ARM  Final   Special Requests   Final    BOTTLES DRAWN AEROBIC AND ANAEROBIC Blood Culture adequate volume   Culture   Final    NO GROWTH 5 DAYS Performed at [facility], [address] [place], [place] [address]    Report Status 08/23/2022 FINAL  Final  Respiratory (~20 pathogens) panel by PCR     Status: None   Collection Time: 08/21/22  1:50 PM   Specimen: Nasopharyngeal Swab;  Respiratory  Result Value Ref Range Status   Adenovirus NOT DETECTED NOT DETECTED Final   Coronavirus 229E NOT DETECTED NOT DETECTED Final    Comment: (NOTE) The Coronavirus on the Respiratory Panel, DOES NOT test for the novel  Coronavirus (2019 nCoV)    Coronavirus HKU1 NOT DETECTED NOT DETECTED Final   Coronavirus NL63 NOT DETECTED NOT DETECTED Final   Coronavirus OC43 NOT DETECTED NOT DETECTED Final   Metapneumovirus NOT DETECTED NOT DETECTED Final   Rhinovirus / Enterovirus NOT DETECTED NOT DETECTED Final   Influenza A NOT DETECTED NOT DETECTED Final   Influenza B NOT DETECTED NOT DETECTED Final   Parainfluenza Virus 1 NOT DETECTED NOT DETECTED Final   Parainfluenza Virus 2 NOT DETECTED NOT DETECTED Final   Parainfluenza Virus 3 NOT DETECTED NOT DETECTED Final   Parainfluenza Virus 4 NOT DETECTED NOT DETECTED Final   Respiratory Syncytial Virus NOT DETECTED NOT DETECTED Final   Bordetella pertussis NOT DETECTED NOT DETECTED Final   Bordetella Parapertussis NOT DETECTED NOT DETECTED Final   Chlamydophila pneumoniae NOT DETECTED NOT DETECTED Final  Mycoplasma pneumoniae NOT DETECTED NOT DETECTED Final    Comment: Performed at [facility], [address] [place], [place] [address]  Culture, blood (Routine X 2) w Reflex to ID Panel     Status: None   Collection Time: 08/21/22  8:58 PM   Specimen: BLOOD  Result Value Ref Range Status   Specimen Description BLOOD LEFT WRIST  Final   Special Requests   Final    BOTTLES DRAWN AEROBIC AND ANAEROBIC Blood Culture adequate volume   Culture   Final    NO GROWTH 5 DAYS Performed at [facility], [address] [place], [place] [address]    Report Status 08/26/2022 FINAL  Final  Culture, blood (Routine X 2) w Reflex to ID Panel     Status: None   Collection Time: 08/21/22 11:01 PM   Specimen: BLOOD  Result Value Ref Range Status   Specimen Description BLOOD BLOOD LEFT ARM  Final   Special Requests   Final     BOTTLES DRAWN AEROBIC AND ANAEROBIC Blood Culture adequate volume   Culture   Final    NO GROWTH 5 DAYS Performed at [facility], [address] [place], [place] [address]    Report Status 08/26/2022 FINAL  Final  Expectorated Sputum Assessment w Gram Stain, Rflx to Resp Cult     Status: None   Collection Time: 08/22/22 11:22 AM   Specimen: Sputum  Result Value Ref Range Status   Specimen Description SPUTUM  Final   Special Requests EXPSU  Final   Sputum evaluation   Final    THIS SPECIMEN IS ACCEPTABLE FOR SPUTUM CULTURE Performed at [facility], [address], [place], [place] [address]    Report Status 08/22/2022 FINAL  Final  Culture, Respiratory w Gram Stain     Status: None   Collection Time: 08/22/22 11:22 AM   Specimen: SPU  Result Value Ref Range Status   Specimen Description   Final    SPUTUM Performed at [facility], [address] [place], [place] [address]    Special Requests   Final    EXPSU Reflexed from [id number] Performed at [facility], [address] [place], [place] [address]    Gram Stain   Final    ABUNDANT SQUAMOUS EPITHELIAL CELLS PRESENT FEW WBC PRESENT, PREDOMINANTLY PMN ABUNDANT GRAM VARIABLE ROD ABUNDANT GRAM POSITIVE COCCI IN PAIRS    Culture   Final    Normal respiratory flora-no Staph aureus or Pseudomonas seen Performed at [facility], [address], [place], [place] [address]    Report Status 08/25/2022 FINAL  Final    Coagulation Studies: No results for input(s): "LABPROT", "INR" in the last 72 hours.  Urinalysis: No results for input(s): "COLORURINE", "LABSPEC", "PHURINE", "GLUCOSEU", "HGBUR", "BILIRUBINUR", "KETONESUR", "PROTEINUR", "UROBILINOGEN", "NITRITE", "LEUKOCYTESUR" in the last 72 hours.  Invalid input(s): "APPERANCEUR"     Imaging: DG Chest Port 1 View  Result Date: 09/16/2022 CLINICAL DATA:  Hypoxia EXAM: PORTABLE CHEST 1 VIEW COMPARISON:  09/13/2022 FINDINGS:  Cardiomegaly. Worsened interstitial and alveolar density diffusely. Findings could be due to progressive pneumonia or progressive edema. IMPRESSION: Worsened interstitial and alveolar density diffusely. Findings could be due to progressive pneumonia or progressive edema. Electronically Signed   By: Paulina Fusi M.D.   On: 09/16/2022 11:18     Medications:    sodium chloride 5 mL/hr at 09/11/22 2138    acidophilus  1 capsule Oral Daily   apixaban  2.5 mg Oral BID   Chlorhexidine Gluconate Cloth  6 each Topical Daily   cyanocobalamin  1,000 mcg Oral Daily   diltiazem  360 mg Oral Daily   doxycycline  100 mg Oral Q12H   feeding supplement  237 mL Oral BID BM   liver oil-zinc oxide   Topical TID   metoprolol succinate  100 mg Oral Daily   multivitamins with iron  1 tablet Oral Daily   pantoprazole  40 mg Oral Daily   polyethylene glycol  17 g Oral Daily   rosuvastatin  10 mg Oral Daily   senna  1 tablet Oral Daily   sodium chloride, acetaminophen, albuterol, dextromethorphan-guaiFENesin, melatonin, ondansetron (ZOFRAN) IV, simethicone, sodium chloride, traMADol  Assessment/ Plan:  Brett Riley is a 87 y.o.  male with recent prolonged admission for aortic ulcer s/p endovascular repair 07/24/22, MSSA bacteremia with L spine osteomyelitis and discitis, chronic diastolic heart failure, hypertension, hyperlipidemia, coronary artery disease, atrial fibrillation who was admitted to [facility] on 08/18/2022 for HCAP (healthcare-associated pneumonia) [J18.9] Acute on chronic respiratory failure with hypoxia (HCC) [J96.21] Sepsis, due to unspecified organism, unspecified whether acute organ dysfunction present (HCC) [A41.9]     1.  Acute kidney injury with proteinuria and hematuria on baseline creatinine of 1.05 GFR > 60 on 07/02/22.  Secondary to ATN versus AIN from cefazolin. IV contrast exposure on 07/24/22. - nonoliguric urine output - restart IV furosemide.  - if no improvement, may need  dialysis.    2.  Acute on chronic diastolic heart failure. With LE edema  Ejection fraction 55 to 60% with grade 1 diastolic dysfunction.  3.  Anemia with renal failure:     Lab Results  Component Value Date   HGB 8.3 (L) 09/16/2022  Hemoglobin low. Not on ESA.     LOS: 29 Brett Riley 7/15/202412:20 PM

## 2022-09-16 NOTE — Consult Note (Signed)
[facility] Customer service manager [facility] Accountable Care Organization (ACO) [facility] Liaison Note  09/16/2022  JULIA KULZER 01-18-1936 [id number]  Location: [facility] RN Hospital Liaison screened the patient remotely at [facility]  Insurance: [employer] Advantage   Brett Riley is a 87 y.o. male who is a Primary Care Patient of Dale War[place]on, MD. The patient was screened for 30 day readmission hospitalization with noted high risk score for unplanned readmission risk with 2 IP in 6 months.  The patient was assessed for potential [facility]) Care Management service needs for post hospital transition for care coordination. Review of patient's electronic medical record reveals patient was admitted for Acute respiratory failure with hypoxia. Pending SNF placement for a level of care via [employer].  Plan: [facility] Liaison will continue to follow progress and disposition to asess for post hospital community care coordination/management needs.  Referral request for community care coordination: pending disposition.   [facility] Care Management/Population Health does not replace or interfere with any arrangements made by the Inpatient Transition of Care team.   For questions contact:   Elliot Cousin, RN, [facility] Liaison [place]   Population Health Office Hours MTWF  8:00 am-6:00 pm Off on Thursday [phone] mobile [phone] [Office toll free line] Office Hours are M-F 8:30 - 5 pm 24 hour nurse advise line [phone] Concierge  [email or web address]

## 2022-09-16 NOTE — Care Management Important Message (Signed)
Important Message  Patient Details  Name: Brett Riley MRN: [id number] Date of Birth: 1936/01/31   Medicare Important Message Given:  Yes     [name] 09/16/2022, 11:24 AM

## 2022-09-16 NOTE — TOC Progression Note (Signed)
Transition of Care [facility]) - Progression Note    Patient Details  Name: Brett Riley MRN: [id number] Date of Birth: [birth year]  [facility] CM/SW Contact  Truddie Hidden, RN Phone Number: 09/16/2022, 9:57 AM  Clinical Narrative:    Per [facility] assistant Drucie Opitz still pending.    Expected Discharge Plan: Skilled Nursing Facility Barriers to Discharge: Continued Medical Work up, English as a second language teacher  Expected Discharge Plan and Services     Post Acute Care Choice: Skilled Nursing Facility Living arrangements for the past 2 months: Single Family Home                                       Social Determinants of Health (SDOH) Interventions SDOH Screenings   Food Insecurity: No Food Insecurity (08/18/2022)  Housing: Low Risk  (08/18/2022)  Transportation Needs: No Transportation Needs (08/18/2022)  Utilities: Not At Risk (08/18/2022)  Depression (PHQ2-9): Low Risk  (06/06/2022)  Financial Resource Strain: Low Risk  (08/29/2020)  Physical Activity: Insufficiently Active (08/29/2020)  Social Connections: Unknown (08/29/2020)  Stress: No Stress Concern Present (08/29/2020)  Tobacco Use: Low Risk  (09/05/2022)    Readmission Risk Interventions     No data to display

## 2022-09-17 ENCOUNTER — Inpatient Hospital Stay: Payer: Medicare HMO

## 2022-09-17 DIAGNOSIS — J9601 Acute respiratory failure with hypoxia: Secondary | ICD-10-CM | POA: Diagnosis not present

## 2022-09-17 LAB — CBC WITH DIFFERENTIAL/PLATELET
Abs Immature Granulocytes: 0.03 10*3/uL (ref 0.00–0.07)
Basophils Absolute: 0 10*3/uL (ref 0.0–0.1)
Basophils Relative: 0 %
Eosinophils Absolute: 0.2 10*3/uL (ref 0.0–0.5)
Eosinophils Relative: 3 %
HCT: 25.6 % — ABNORMAL LOW (ref 39.0–52.0)
Hemoglobin: 7.8 g/dL — ABNORMAL LOW (ref 13.0–17.0)
Immature Granulocytes: 1 %
Lymphocytes Relative: 27 %
Lymphs Abs: 1.6 10*3/uL (ref 0.7–4.0)
MCH: 29.5 pg (ref 26.0–34.0)
MCHC: 30.5 g/dL (ref 30.0–36.0)
MCV: 97 fL (ref 80.0–100.0)
Monocytes Absolute: 0.2 10*3/uL (ref 0.1–1.0)
Monocytes Relative: 3 %
Neutro Abs: 3.9 10*3/uL (ref 1.7–7.7)
Neutrophils Relative %: 66 %
Platelets: 130 10*3/uL — ABNORMAL LOW (ref 150–400)
RBC: 2.64 MIL/uL — ABNORMAL LOW (ref 4.22–5.81)
RDW: 16.2 % — ABNORMAL HIGH (ref 11.5–15.5)
WBC: 5.9 10*3/uL (ref 4.0–10.5)
nRBC: 0 % (ref 0.0–0.2)

## 2022-09-17 LAB — BASIC METABOLIC PANEL WITH GFR
Anion gap: 14 (ref 5–15)
BUN: 84 mg/dL — ABNORMAL HIGH (ref 8–23)
CO2: 22 mmol/L (ref 22–32)
Calcium: 8.6 mg/dL — ABNORMAL LOW (ref 8.9–10.3)
Chloride: 104 mmol/L (ref 98–111)
Creatinine, Ser: 3.66 mg/dL — ABNORMAL HIGH (ref 0.61–1.24)
GFR, Estimated: 16 mL/min — ABNORMAL LOW
Glucose, Bld: 104 mg/dL — ABNORMAL HIGH (ref 70–99)
Potassium: 3.6 mmol/L (ref 3.5–5.1)
Sodium: 140 mmol/L (ref 135–145)

## 2022-09-17 MED ORDER — FUROSEMIDE 10 MG/ML IJ SOLN
80.0000 mg | Freq: Once | INTRAMUSCULAR | Status: AC
  Administered 2022-09-17: 80 mg via INTRAVENOUS
  Filled 2022-09-17: qty 8

## 2022-09-17 MED ORDER — FUROSEMIDE 10 MG/ML IJ SOLN
8.0000 mg/h | INTRAVENOUS | Status: DC
  Administered 2022-09-17 – 2022-09-18 (×2): 8 mg/h via INTRAVENOUS
  Filled 2022-09-17 (×3): qty 20

## 2022-09-17 NOTE — Progress Notes (Signed)
PROGRESS NOTE    Brett Riley  JJO:[id number] DOB: 1935/05/16 DOA: 08/18/2022 PCP: Dale Warfield, MD    Brief Narrative:  87 y.o. male with medical history significant of chronic dCHF, HTN, HLD, CAD, CKD-3a, anemia, chronic A fib on Eliquis, who presents 08/18/2022 to ED from rehab, chief complaint SOB.  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 Net IO Since Admission: -5,549.14 mL [08/20/22 1608]. Heparin stopped d/t rectal bleeding overnight.  06/19: euvolemic per cardio, d/c lasix, O2 requirement still high, CT chest reviewed, ?edema/atypical infection, given clinically worsening respiratory status have asked pulmonary and ID to consult. Have d/c amiodarone and ordered steroids in case amio toxicity.  06/20: ID and pulmonary assisting with the management.   6/22-6/25: patient feels clinically improved. Stable with oxygen via [place] and weaned down to 2-3L. Patient requiring judicial adjustments to balance between hypervolemia and renal injury with diuresis. Unable to pull fluid from lungs with thoracentesis so medical management is best option. His IV Abx to treat his vertebral osteomyelitis from previous admission continue unchanged and managed by ID.    6/28: Bed offered at [facility].  Authorization pending 7/1: No status changes.  Per patient's daughter he has been seeming more depressed.  I have offered outpatient referral to psychiatry.   7/2: Insurance authorization for LTAC continues to be pending   7/3 : Peer-to-peer review done.  Not a candidate for LTAC.   Approved for skilled nursing facility patient   7/9 : Medically stable and ready for discharge to skilled nursing facility 7/10: No acute events.  Pending insurance authorization  7/15: Acute hypoxia noted this morning.  Placed on high flow nasal cannula.  Pulmonary edema noted on check chest x-ray.  Lasix restarted.  7/16: Further deterioration of respiratory status.  Required initiation of BiPAP initiation of Lasix gtt.   Assessment & Plan:   Principal Problem:   Acute respiratory failure with hypoxia (HCC) Active Problems:   Acute on chronic heart failure with preserved ejection fraction (HFpEF) (HCC)   MSSA bacteremia   Vertebral osteomyelitis (HCC)   CAD (coronary artery disease)   Myocardial injury   Hypertension   Hyperlipidemia   Chronic kidney disease, stage 3a (HCC)   Normocytic anemia   Atrial fibrillation, chronic (HCC)   Paroxysmal atrial fibrillation (HCC)   HCAP (healthcare-associated pneumonia)   Sepsis (HCC)   Pleural effusion   Amiodarone pulmonary toxicity   Acute on chronic HFrEF (heart failure with reduced ejection fraction) (HCC)   Pleural effusion on right   Status post thoracentesis   Heart failure with preserved ejection fraction (HCC)   Stage 3 chronic kidney disease (HCC)  Acute on chronic respiratory failure with hypoxia due to acute on chronic CHF  possible pneumonia, bilateral small pleural effusion- as seen on chest CT Not on oxygen supplementation at baseline Was previously on room air Saturation acutely worsened on 7/15 Did not respond to IV Lasix pushes on 7/15 Plan: Lasix 80 mg IV x 1 followed by Lasix gtt. at 8 mg/h BiPAP stat Oxygen as tolerated  AKI on Chronic kidney disease, stage 3a (HCC):  Creatinine has been acutely worsening.  Peaked at 3.82.  Relatively stable.  Urine output has been inadequate Plan: Lasix 80 mg IV push followed by gtt.  Nephrology following closely.  Consider initiation of hemodialysis  Acute on chronic  diastolic CHF- Markedly net negative over course of admission.  Fluid retention worsened on 7/15.  Urine output has been inadequate over the last 24 to 48 hours Plan: Lasix push followed by gtt. at 8 mg/h   Pleural effusion IR unable to perform thoracentesis 6/20, 6/21. Medical management.     Atrial fibrillation, chronic (HCC) Heart rate controlled.  On diltiazem and metoprolol per cardiology.  On Eliquis.  Monitor for bleeding.   Recent hx of MSSA bacteremia vertebral osteomyelitis (HCC): Received Ancef until 7/11.  This was stopped and doxycycline 100 mg p.o. twice daily x 6 weeks started per ID recommendations.  PICC line removed 7/12.   CAD  Myocardial injury  HLD:  Evidence of ACS Continue Crestor, beta-blocker, Eliquis No plans for inpatient ischemic evaluation    GERD PPI  Ileus Noted on KUB 7/15.  Likely due to poor level of mobility.  Encourage ambulation as tolerated.  Bowel regimen escalated.    DVT prophylaxis: Eliquis Code Status: DNR Family Communication: Spouse at bedside 7/10, 7/11, 7/12, 7/13, 7/14, 7/15, 7/16 Disposition Plan: Status is: Inpatient Remains inpatient appropriate because: Recurrent hypoxia   Level of care: Progressive  Consultants:  Nephrology  Procedures:  None  Antimicrobials: Doxycycline   Subjective: Seen and examined.  Wife remains at bedside.  Hypoxic this morning.  Requiring high flow nasal cannula 10 L.  Mentating clearly.  Objective: Vitals:   09/17/22 0913 09/17/22 0914 09/17/22 1124 09/17/22 1239  BP:  (!) 141/71  126/67  Pulse: 77 66 76 65  Resp: 19  19 16   Temp:    97.6 F (36.4 C)  TempSrc:      SpO2: 95% 98% 95% 92%  Weight:      Height:        Intake/Output Summary (Last 24 hours) at 09/17/2022 1320 Last data filed at 09/17/2022 1241 Gross per 24 hour  Intake 480 ml  Output 1400 ml  Net -920 ml   Filed Weights   09/13/22 0356 09/14/22 0352 09/16/22 0300  Weight: 87.8 kg 87.8 kg 90 kg     Examination:  General exam: Appears fatigued Respiratory system: Scattered crackles bilaterally.  Normal work of breathing.  10 L Cardiovascular system: S1-S2, RRR, no murmurs, 2+ pedal edema Gastrointestinal system: Soft, NT/ND, normal bowel sounds Central nervous system: Alert and oriented. No focal neurological deficits. Extremities: Decreased power bilateral lower extremities Skin: No rashes, lesions or ulcers Psychiatry: Judgement and insight appear normal. Mood & affect appropriate.     Data Reviewed: I have personally reviewed following labs and imaging studies  CBC: Recent Labs  Lab 09/12/22 0755 09/16/22 0814 09/17/22 0924  WBC 7.1 7.9 5.9  NEUTROABS 4.8 4.7 3.9  HGB 8.0* 8.3* 7.8*  HCT 25.6* 25.7* 25.6*  MCV 92.1 91.5 97.0  PLT 124* 141* 130*   Basic Metabolic Panel: Recent Labs  Lab 09/12/22 0755 09/16/22 0814 09/17/22 0924  NA 140 142 140  K 3.4* 3.5 3.6  CL 106 108 104  CO2 23 24 22   GLUCOSE 83 97 104*  BUN 84* 84* 84*  CREATININE 3.64* 3.71* 3.66*  CALCIUM 8.4* 8.8* 8.6*   GFR: Estimated Creatinine Clearance: 16.5 mL/min (A) (by C-G formula based on SCr of 3.66 mg/dL (H)). Liver Function  Tests: No results for input(s): "AST", "ALT", "ALKPHOS", "BILITOT", "PROT", "ALBUMIN" in the last 168 hours. No results for input(s): "LIPASE", "AMYLASE" in the last 168 hours. No results for input(s): "AMMONIA" in the last 168 hours. Coagulation Profile: No results for input(s): "INR", "PROTIME" in the last 168 hours. Cardiac Enzymes: No results for input(s): "CKTOTAL", "CKMB", "CKMBINDEX", "TROPONINI" in the last 168 hours. BNP (last 3 results) No results for input(s): "PROBNP" in the last 8760 hours. HbA1C: No results for input(s): "HGBA1C" in the last 72 hours. CBG: No results for input(s): "GLUCAP" in the last 168 hours. Lipid Profile: No results for input(s): "CHOL", "HDL", "LDLCALC", "TRIG", "CHOLHDL", "LDLDIRECT" in the last 72 hours. Thyroid  Function Tests: No results for input(s): "TSH", "T4TOTAL", "FREET4", "T3FREE", "THYROIDAB" in the last 72 hours. Anemia Panel: No results for input(s): "VITAMINB12", "FOLATE", "FERRITIN", "TIBC", "IRON", "RETICCTPCT" in the last 72 hours. Sepsis Labs: No results for input(s): "PROCALCITON", "LATICACIDVEN" in the last 168 hours.  No results found for this or any previous visit (from the past 240 hour(s)).       Radiology Studies: DG Chest Port 1 View  Result Date: 09/17/2022 CLINICAL DATA:  Respiratory distress and hypoxia EXAM: PORTABLE CHEST 1 VIEW COMPARISON:  Chest radiograph 1 day prior FINDINGS: The cardiomediastinal silhouette is stable Diffusely increased interstitial markings throughout both lungs again seen with slight interval improvement in the right lung. There is no significant pleural effusion. There is no pneumothorax. There is no acute osseous abnormality. IMPRESSION: Diffusely increased interstitial markings throughout both lungs with slight interval improvement in the right lung since the study from 1 day prior. Electronically Signed   By: Lesia Hausen M.D.   On: 09/17/2022 12:10   DG Abd 1 View  Result Date: 09/16/2022 CLINICAL DATA:  Abdominal pain EXAM: ABDOMEN - 1 VIEW COMPARISON:  None FINDINGS: There is gaseous distention of small-bowel loops and colon. There is previous intravascular stent repair in abdominal aorta. Increased interstitial markings in the lower lung fields suggest possible scarring. Arterial calcifications are seen. Degenerative changes are noted in lumbar spine. IMPRESSION: There is mild gaseous dilation of small bowel loops. There is moderate to marked gaseous distention in colon. Findings suggest possible ileus. Arteriosclerosis. Previous endovascular stent repair in aorta. Possible scarring in the lower lung fields. Lumbar spondylosis. Electronically Signed   By: Ernie Avena M.D.   On: 09/16/2022 13:24   DG Chest Port 1 View  Result Date:  09/16/2022 CLINICAL DATA:  Hypoxia EXAM: PORTABLE CHEST 1 VIEW COMPARISON:  09/13/2022 FINDINGS: Cardiomegaly. Worsened interstitial and alveolar density diffusely. Findings could be due to progressive pneumonia or progressive edema. IMPRESSION: Worsened interstitial and alveolar density diffusely. Findings could be due to progressive pneumonia or progressive edema. Electronically Signed   By: Paulina Fusi M.D.   On: 09/16/2022 11:18        Scheduled Meds:  acidophilus  1 capsule Oral Daily   apixaban  2.5 mg Oral BID   Chlorhexidine Gluconate Cloth  6 each Topical Daily   cyanocobalamin  1,000 mcg Oral Daily   diltiazem  360 mg Oral Daily   doxycycline  100 mg Oral Q12H   feeding supplement  237 mL Oral BID BM   liver oil-zinc oxide   Topical TID   metoprolol succinate  100 mg Oral Daily   multivitamins with iron  1 tablet Oral Daily   pantoprazole  40 mg Oral Daily   polyethylene glycol  17 g Oral Daily   rosuvastatin  10 mg Oral Daily   senna-docusate  1 tablet Oral BID   Continuous Infusions:  sodium chloride 5 mL/hr at 09/11/22 2138   furosemide (LASIX) 200 mg in dextrose 5 % 100 mL (2 mg/mL) infusion 8 mg/hr (09/17/22 1050)     LOS: 30 days     Tresa Moore, MD [facility]   If 7PM-7AM, please contact night-coverage  09/17/2022, 1:20 PM

## 2022-09-17 NOTE — Progress Notes (Signed)
[facility]  ROUNDING NOTE   Subjective:   Patient remains on high flow nasal cannula Wife at bedside  Little response to IV furosemide Agree with furosemide drip  Lower extremity edema remains   Objective:  Vital signs in last 24 hours:  Temp:  [97.9 F (36.6 C)-98.7 F (37.1 C)] 98.2 F (36.8 C) (07/15 2352) Pulse Rate:  [66-78] 76 (07/16 1124) Resp:  [16-20] 19 (07/16 1124) BP: (133-141)/(65-71) 141/71 (07/16 0914) SpO2:  [87 %-98 %] 95 % (07/16 1124) FiO2 (%):  [60 %] 60 % (07/16 0914)  Weight change:  Filed Weights   09/13/22 0356 09/14/22 0352 09/16/22 0300  Weight: 87.8 kg 87.8 kg 90 kg    Intake/Output: I/O last 3 completed shifts: In: 480 [P.O.:480] Out: 1150 [Urine:1150]   Intake/Output this shift:  No intake/output data recorded.  Physical Exam: General: NAD, laying in bed  Head: Normocephalic, atraumatic. Moist oral mucosal membranes  Eyes: Anicteric  Lungs:  Bilateral crackles, HFNC  Heart: Regular rate and rhythm  Abdomen:  +distended, nontender  Extremities:  3+ peripheral edema.    Neurologic: Alert and oriented, moving all four extremities  Skin: No lesions  Access: none    Basic Metabolic Panel: Recent Labs  Lab 09/12/22 0755 09/16/22 0814 09/17/22 0924  NA 140 142 140  K 3.4* 3.5 3.6  CL 106 108 104  CO2 23 24 22   GLUCOSE 83 97 104*  BUN 84* 84* 84*  CREATININE 3.64* 3.71* 3.66*  CALCIUM 8.4* 8.8* 8.6*    Liver Function Tests: No results for input(s): "AST", "ALT", "ALKPHOS", "BILITOT", "PROT", "ALBUMIN" in the last 168 hours.  No results for input(s): "LIPASE", "AMYLASE" in the last 168 hours. No results for input(s): "AMMONIA" in the last 168 hours.  CBC: Recent Labs  Lab 09/12/22 0755 09/16/22 0814 09/17/22 0924  WBC 7.1 7.9 5.9  NEUTROABS 4.8 4.7 3.9  HGB 8.0* 8.3* 7.8*  HCT 25.6* 25.7* 25.6*  MCV 92.1 91.5 97.0  PLT 124* 141* 130*    Cardiac Enzymes: No results for input(s): "CKTOTAL",  "CKMB", "CKMBINDEX", "TROPONINI" in the last 168 hours.   BNP: Invalid input(s): "POCBNP"  CBG: No results for input(s): "GLUCAP" in the last 168 hours.   Microbiology: Results for orders placed or performed during the hospital encounter of 08/18/22  Blood culture (routine x 2)     Status: None   Collection Time: 08/18/22 10:45 AM   Specimen: BLOOD LEFT ARM  Result Value Ref Range Status   Specimen Description BLOOD LEFT ARM  Final   Special Requests   Final    BOTTLES DRAWN AEROBIC AND ANAEROBIC Blood Culture adequate volume   Culture   Final    NO GROWTH 5 DAYS Performed at [facility], [address], [place], [place] [address]    Report Status 08/23/2022 FINAL  Final  Resp Panel by RT-PCR (Flu A&B, Covid) Anterior Nasal Swab     Status: None   Collection Time: 08/18/22 10:46 AM   Specimen: Anterior Nasal Swab  Result Value Ref Range Status   SARS Coronavirus 2 by RT PCR NEGATIVE NEGATIVE Final    Comment: (NOTE) SARS-CoV-2 target nucleic acids are NOT DETECTED.  The SARS-CoV-2 RNA is generally detectable in upper respiratory specimens during the acute phase of infection. The lowest concentration of SARS-CoV-2 viral copies this assay can detect is 138 copies/mL. A negative result does not preclude SARS-Cov-2 infection and should not be used as the sole basis for treatment  or other patient management decisions. A negative result may occur with  improper specimen collection/handling, submission of specimen other than nasopharyngeal swab, presence of viral mutation(s) within the areas targeted by this assay, and inadequate number of viral copies(<138 copies/mL). A negative result must be combined with clinical observations, patient history, and epidemiological information. The expected result is Negative.  Fact Sheet for Patients:  BloggerCourse.com  Fact Sheet for Healthcare Providers:   SeriousBroker.it  This test is no t yet approved or cleared by the [place] FDA and  has been authorized for detection and/or diagnosis of SARS-CoV-2 by FDA under an Emergency Use Authorization (EUA). This EUA will remain  in effect (meaning this test can be used) for the duration of the COVID-19 declaration under Section 564(b)(1) of the Act, 21 U.S.C.section 360bbb-3(b)(1), unless the authorization is terminated  or revoked sooner.       Influenza A by PCR NEGATIVE NEGATIVE Final   Influenza B by PCR NEGATIVE NEGATIVE Final    Comment: (NOTE) The Xpert Xpress SARS-CoV-2/FLU/RSV plus assay is intended as an aid in the diagnosis of influenza from Nasopharyngeal swab specimens and should not be used as a sole basis for treatment. Nasal washings and aspirates are unacceptable for Xpert Xpress SARS-CoV-2/FLU/RSV testing.  Fact Sheet for Patients: BloggerCourse.com  Fact Sheet for Healthcare Providers: SeriousBroker.it  This test is not yet approved or cleared by the [place] FDA and has been authorized for detection and/or diagnosis of SARS-CoV-2 by FDA under an Emergency Use Authorization (EUA). This EUA will remain in effect (meaning this test can be used) for the duration of the COVID-19 declaration under Section 564(b)(1) of the Act, 21 U.S.C. section 360bbb-3(b)(1), unless the authorization is terminated or revoked.  Performed at [facility], [address] [place], [place] [address]   Culture, blood (Routine X 2) w Reflex to ID Panel     Status: None   Collection Time: 08/18/22  9:19 PM   Specimen: BLOOD LEFT ARM  Result Value Ref Range Status   Specimen Description BLOOD LEFT ARM  Final   Special Requests   Final    BOTTLES DRAWN AEROBIC AND ANAEROBIC Blood Culture adequate volume   Culture   Final    NO GROWTH 5 DAYS Performed at [facility], [address] [place], [place] [address]    Report Status 08/23/2022 FINAL  Final  Respiratory (~20 pathogens) panel by PCR     Status: None   Collection Time: 08/21/22  1:50 PM   Specimen: Nasopharyngeal Swab; Respiratory  Result Value Ref Range Status   Adenovirus NOT DETECTED NOT DETECTED Final   Coronavirus 229E NOT DETECTED NOT DETECTED Final    Comment: (NOTE) The Coronavirus on the Respiratory Panel, DOES NOT test for the novel  Coronavirus (2019 nCoV)    Coronavirus HKU1 NOT DETECTED NOT DETECTED Final   Coronavirus NL63 NOT DETECTED NOT DETECTED Final   Coronavirus OC43 NOT DETECTED NOT DETECTED Final   Metapneumovirus NOT DETECTED NOT DETECTED Final   Rhinovirus / Enterovirus NOT DETECTED NOT DETECTED Final   Influenza A NOT DETECTED NOT DETECTED Final   Influenza B NOT DETECTED NOT DETECTED Final   Parainfluenza Virus 1 NOT DETECTED NOT DETECTED Final   Parainfluenza Virus 2 NOT DETECTED NOT DETECTED Final   Parainfluenza Virus 3 NOT DETECTED NOT DETECTED Final   Parainfluenza Virus 4 NOT DETECTED NOT DETECTED Final   Respiratory Syncytial Virus NOT DETECTED NOT DETECTED Final   Bordetella pertussis  NOT DETECTED NOT DETECTED Final   Bordetella Parapertussis NOT DETECTED NOT DETECTED Final   Chlamydophila pneumoniae NOT DETECTED NOT DETECTED Final   Mycoplasma pneumoniae NOT DETECTED NOT DETECTED Final    Comment: Performed at [facility], [address] [place], [place] [address]  Culture, blood (Routine X 2) w Reflex to ID Panel     Status: None   Collection Time: 08/21/22  8:58 PM   Specimen: BLOOD  Result Value Ref Range Status   Specimen Description BLOOD LEFT WRIST  Final   Special Requests   Final    BOTTLES DRAWN AEROBIC AND ANAEROBIC Blood Culture adequate volume   Culture   Final    NO GROWTH 5 DAYS Performed at [facility], [address] [place], [place] [address]    Report Status 08/26/2022 FINAL  Final  Culture, blood (Routine X 2) w Reflex  to ID Panel     Status: None   Collection Time: 08/21/22 11:01 PM   Specimen: BLOOD  Result Value Ref Range Status   Specimen Description BLOOD BLOOD LEFT ARM  Final   Special Requests   Final    BOTTLES DRAWN AEROBIC AND ANAEROBIC Blood Culture adequate volume   Culture   Final    NO GROWTH 5 DAYS Performed at [facility], [address], [place], [place] [address]    Report Status 08/26/2022 FINAL  Final  Expectorated Sputum Assessment w Gram Stain, Rflx to Resp Cult     Status: None   Collection Time: 08/22/22 11:22 AM   Specimen: Sputum  Result Value Ref Range Status   Specimen Description SPUTUM  Final   Special Requests EXPSU  Final   Sputum evaluation   Final    THIS SPECIMEN IS ACCEPTABLE FOR SPUTUM CULTURE Performed at [facility], [address], [place], [place] [address]    Report Status 08/22/2022 FINAL  Final  Culture, Respiratory w Gram Stain     Status: None   Collection Time: 08/22/22 11:22 AM   Specimen: SPU  Result Value Ref Range Status   Specimen Description   Final    SPUTUM Performed at [facility], [address] [place], [place] [address]    Special Requests   Final    EXPSU Reflexed from [id number] Performed at [facility], [address] [place], [place] [address]    Gram Stain   Final    ABUNDANT SQUAMOUS EPITHELIAL CELLS PRESENT FEW WBC PRESENT, PREDOMINANTLY PMN ABUNDANT GRAM VARIABLE ROD ABUNDANT GRAM POSITIVE COCCI IN PAIRS    Culture   Final    Normal respiratory flora-no Staph aureus or Pseudomonas seen Performed at [facility], [address], [place], [place] [address]    Report Status 08/25/2022 FINAL  Final    Coagulation Studies: No results for input(s): "LABPROT", "INR" in the last 72 hours.  Urinalysis: No results for input(s): "COLORURINE", "LABSPEC", "PHURINE", "GLUCOSEU", "HGBUR", "BILIRUBINUR", "KETONESUR", "PROTEINUR", "UROBILINOGEN", "NITRITE", "LEUKOCYTESUR" in the last  72 hours.  Invalid input(s): "APPERANCEUR"     Imaging: DG Chest Port 1 View  Result Date: 09/17/2022 CLINICAL DATA:  Respiratory distress and hypoxia EXAM: PORTABLE CHEST 1 VIEW COMPARISON:  Chest radiograph 1 day prior FINDINGS: The cardiomediastinal silhouette is stable Diffusely increased interstitial markings throughout both lungs again seen with slight interval improvement in the right lung. There is no significant pleural effusion. There is no pneumothorax. There is no acute osseous abnormality. IMPRESSION: Diffusely increased interstitial markings  throughout both lungs with slight interval improvement in the right lung since the study from 1 day prior. Electronically Signed   By: Lesia Hausen M.D.   On: 09/17/2022 12:10   DG Abd 1 View  Result Date: 09/16/2022 CLINICAL DATA:  Abdominal pain EXAM: ABDOMEN - 1 VIEW COMPARISON:  None FINDINGS: There is gaseous distention of small-bowel loops and colon. There is previous intravascular stent repair in abdominal aorta. Increased interstitial markings in the lower lung fields suggest possible scarring. Arterial calcifications are seen. Degenerative changes are noted in lumbar spine. IMPRESSION: There is mild gaseous dilation of small bowel loops. There is moderate to marked gaseous distention in colon. Findings suggest possible ileus. Arteriosclerosis. Previous endovascular stent repair in aorta. Possible scarring in the lower lung fields. Lumbar spondylosis. Electronically Signed   By: Ernie Avena M.D.   On: 09/16/2022 13:24   DG Chest Port 1 View  Result Date: 09/16/2022 CLINICAL DATA:  Hypoxia EXAM: PORTABLE CHEST 1 VIEW COMPARISON:  09/13/2022 FINDINGS: Cardiomegaly. Worsened interstitial and alveolar density diffusely. Findings could be due to progressive pneumonia or progressive edema. IMPRESSION: Worsened interstitial and alveolar density diffusely. Findings could be due to progressive pneumonia or progressive edema. Electronically  Signed   By: Paulina Fusi M.D.   On: 09/16/2022 11:18     Medications:    sodium chloride 5 mL/hr at 09/11/22 2138   furosemide (LASIX) 200 mg in dextrose 5 % 100 mL (2 mg/mL) infusion 8 mg/hr (09/17/22 1050)    acidophilus  1 capsule Oral Daily   apixaban  2.5 mg Oral BID   Chlorhexidine Gluconate Cloth  6 each Topical Daily   cyanocobalamin  1,000 mcg Oral Daily   diltiazem  360 mg Oral Daily   doxycycline  100 mg Oral Q12H   feeding supplement  237 mL Oral BID BM   liver oil-zinc oxide   Topical TID   metoprolol succinate  100 mg Oral Daily   multivitamins with iron  1 tablet Oral Daily   pantoprazole  40 mg Oral Daily   polyethylene glycol  17 g Oral Daily   rosuvastatin  10 mg Oral Daily   senna-docusate  1 tablet Oral BID   sodium chloride, acetaminophen, albuterol, bisacodyl, dextromethorphan-guaiFENesin, melatonin, ondansetron (ZOFRAN) IV, simethicone, sodium chloride, traMADol  Assessment/ Plan:  [name] is a 87 y.o.  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  Acute kidney injury with proteinuria and hematuria on baseline creatinine of 1.05 GFR > 60 on 07/02/22.  Secondary to ATN versus AIN from cefazolin. IV contrast exposure on 07/24/22. -Creatinine remained stable -Little response to IV furosemide given yesterday, 650 mL recorded overnight. -Agree with furosemide drip at 8 mg/h -Will monitor closely for need of dialysis.   2.  Acute on chronic diastolic heart failure. With LE edema  Ejection  fraction 55 to 60% with grade 1 diastolic dysfunction.  3.  Anemia with renal failure:     Lab Results  Component Value Date   HGB 7.8 (L) 09/17/2022  Hemoglobin below target.    LOS: 30   7/16/202412:24 PM

## 2022-09-17 NOTE — TOC Progression Note (Addendum)
Transition of Care [facility]) - Progression Note    Patient Details  Name: Brett Riley MRN: [id number] Date of Birth: 03/01/1936  Transition of [facility] CM/SW Contact  Darolyn Rua, [place] Phone Number: 09/17/2022, 9:16 AM  Clinical Narrative:     Update 9:51 am: [name] approved [id number], for 7/16-7/28 next review date 7/29, Marchelle Folks RN case manager clinicals can be faxed to [phone]. Per MD patient deteriorated overnight, not medically stable for discharge. [place] [facility] [place] updated.     Per [employer] patient's insurance [name] is still pending at this time.   Expected Discharge Plan: Skilled Nursing Facility Barriers to Discharge: Continued Medical Work up, English as a second language teacher  Expected Discharge Plan and Services     Post Acute Care Choice: Skilled Nursing Facility Living arrangements for the past 2 months: Single Family Home                                       Social Determinants of Health (SDOH) Interventions SDOH Screenings   Food Insecurity: No Food Insecurity (08/18/2022)  Housing: Low Risk  (08/18/2022)  Transportation Needs: No Transportation Needs (08/18/2022)  Utilities: Not At Risk (08/18/2022)  Depression (PHQ2-9): Low Risk  (06/06/2022)  Financial Resource Strain: Low Risk  (08/29/2020)  Physical Activity: Insufficiently Active (08/29/2020)  Social Connections: Unknown (08/29/2020)  Stress: No Stress Concern Present (08/29/2020)  Tobacco Use: Low Risk  (09/05/2022)    Readmission Risk Interventions     No data to display

## 2022-09-17 NOTE — Progress Notes (Signed)
PT Cancellation Note  Patient Details Name: Brett Riley MRN: [id number] DOB: October 29, 1935   Cancelled Treatment:    Reason Eval/Treat Not Completed: Other (comment). Pt on bipap at this time, PT to hold and re-attempt as able.    Olga Coaster PT, DPT 2:09 PM,09/17/22

## 2022-09-18 ENCOUNTER — Inpatient Hospital Stay: Payer: Medicare HMO

## 2022-09-18 ENCOUNTER — Inpatient Hospital Stay (HOSPITAL_COMMUNITY)
Admit: 2022-09-18 | Discharge: 2022-09-18 | Disposition: A | Discharge status: Home / Self Care | Payer: Medicare HMO | Attending: Physician Assistant | Admitting: Physician Assistant

## 2022-09-18 DIAGNOSIS — I5033 Acute on chronic diastolic (congestive) heart failure: Secondary | ICD-10-CM | POA: Diagnosis not present

## 2022-09-18 DIAGNOSIS — I4819 Other persistent atrial fibrillation: Secondary | ICD-10-CM | POA: Diagnosis not present

## 2022-09-18 DIAGNOSIS — R0602 Shortness of breath: Secondary | ICD-10-CM

## 2022-09-18 DIAGNOSIS — R0609 Other forms of dyspnea: Secondary | ICD-10-CM

## 2022-09-18 DIAGNOSIS — J9621 Acute and chronic respiratory failure with hypoxia: Secondary | ICD-10-CM | POA: Diagnosis not present

## 2022-09-18 DIAGNOSIS — J9601 Acute respiratory failure with hypoxia: Secondary | ICD-10-CM | POA: Diagnosis not present

## 2022-09-18 LAB — BASIC METABOLIC PANEL WITH GFR
Anion gap: 11 (ref 5–15)
BUN: 95 mg/dL — ABNORMAL HIGH (ref 8–23)
CO2: 26 mmol/L (ref 22–32)
Calcium: 8.5 mg/dL — ABNORMAL LOW (ref 8.9–10.3)
Chloride: 103 mmol/L (ref 98–111)
Creatinine, Ser: 3.89 mg/dL — ABNORMAL HIGH (ref 0.61–1.24)
GFR, Estimated: 15 mL/min — ABNORMAL LOW
Glucose, Bld: 110 mg/dL — ABNORMAL HIGH (ref 70–99)
Potassium: 3.5 mmol/L (ref 3.5–5.1)
Sodium: 140 mmol/L (ref 135–145)

## 2022-09-18 LAB — CBC
HCT: 25.2 % — ABNORMAL LOW (ref 39.0–52.0)
Hemoglobin: 8 g/dL — ABNORMAL LOW (ref 13.0–17.0)
MCH: 29.4 pg (ref 26.0–34.0)
MCHC: 31.7 g/dL (ref 30.0–36.0)
MCV: 92.6 fL (ref 80.0–100.0)
Platelets: 152 10*3/uL (ref 150–400)
RBC: 2.72 MIL/uL — ABNORMAL LOW (ref 4.22–5.81)
RDW: 15.8 % — ABNORMAL HIGH (ref 11.5–15.5)
WBC: 6.4 10*3/uL (ref 4.0–10.5)
nRBC: 0 % (ref 0.0–0.2)

## 2022-09-18 LAB — BLOOD GAS, ARTERIAL
Acid-Base Excess: 1.4 mmol/L (ref 0.0–2.0)
Bicarbonate: 24.9 mmol/L (ref 20.0–28.0)
O2 Saturation: 95.9 %
Patient temperature: 37
pCO2 arterial: 35 mmHg (ref 32–48)
pH, Arterial: 7.46 — ABNORMAL HIGH (ref 7.35–7.45)
pO2, Arterial: 67 mmHg — ABNORMAL LOW (ref 83–108)

## 2022-09-18 LAB — ALBUMIN: Albumin: 2.5 g/dL — ABNORMAL LOW (ref 3.5–5.0)

## 2022-09-18 MED ORDER — PIPERACILLIN-TAZOBACTAM 3.375 G IVPB
3.3750 g | Freq: Three times a day (TID) | INTRAVENOUS | Status: DC
  Administered 2022-09-18 – 2022-09-19 (×2): 3.375 g via INTRAVENOUS
  Filled 2022-09-18 (×2): qty 50

## 2022-09-18 MED ORDER — POTASSIUM CHLORIDE CRYS ER 20 MEQ PO TBCR
40.0000 meq | EXTENDED_RELEASE_TABLET | Freq: Once | ORAL | Status: AC
  Administered 2022-09-18: 40 meq via ORAL
  Filled 2022-09-18: qty 2

## 2022-09-18 MED ORDER — METOLAZONE 2.5 MG PO TABS
2.5000 mg | ORAL_TABLET | Freq: Once | ORAL | Status: AC
  Administered 2022-09-18: 2.5 mg via ORAL
  Filled 2022-09-18: qty 1

## 2022-09-18 NOTE — Progress Notes (Signed)
PROGRESS NOTE    Brett Riley  JYN:[id number] DOB: [birth year] DOA: 08/18/2022 PCP: Dale [place], MD    Brief Narrative:  87 y.o. male with medical history significant of chronic dCHF, HTN, HLD, CAD, CKD-3a, anemia, chronic A fib on Eliquis, who presents 08/18/2022 to ED from rehab, chief complaint SOB.  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 Net IO Since Admission: -5,549.14 mL [08/20/22 1608]. Heparin stopped d/t rectal bleeding overnight.  06/19: euvolemic per cardio, d/c lasix, O2 requirement still high, CT chest reviewed, ?edema/atypical infection, given clinically worsening respiratory status have asked pulmonary and ID to consult. Have d/c amiodarone and ordered steroids in case amio toxicity.  06/20: ID and pulmonary assisting with the management.   6/22-6/25: patient feels clinically improved. Stable with oxygen via [place] and weaned down to 2-3L. Patient requiring judicial adjustments to balance between hypervolemia and renal injury with diuresis. Unable to pull fluid from lungs with thoracentesis so medical management is best option. His IV Abx to treat his vertebral osteomyelitis from previous admission continue unchanged and managed by ID.    6/28: Bed offered at [facility].  Authorization pending 7/1: No status changes.  Per patient's daughter he has been seeming more depressed.  I have offered outpatient referral to psychiatry.   7/2: Insurance authorization for LTAC continues to be pending   7/3 : Peer-to-peer review done.  Not a candidate for LTAC.   Approved for skilled nursing facility patient   7/9 : Medically stable and ready for discharge to skilled nursing facility 7/10: No acute events.  Pending insurance authorization  7/15: Acute hypoxia noted this morning.  Placed on high flow nasal cannula.  Pulmonary edema noted on check chest x-ray.  Lasix restarted.  7/16: Further deterioration of respiratory status.  Required initiation of BiPAP initiation of Lasix gtt.   Assessment & Plan:   Principal Problem:   Acute respiratory failure with hypoxia (HCC) Active Problems:   Acute on chronic heart failure with preserved ejection fraction (HFpEF) (HCC)   MSSA bacteremia   Vertebral osteomyelitis (HCC)   CAD (coronary artery disease)   Myocardial injury   Hypertension   Hyperlipidemia   Chronic kidney disease, stage 3a (HCC)   Normocytic anemia   Atrial fibrillation, chronic (HCC)   PVD (peripheral vascular disease) (HCC)   Paroxysmal atrial fibrillation (HCC)   HCAP (healthcare-associated pneumonia)   Sepsis (HCC)   Pleural effusion   Amiodarone pulmonary toxicity   Acute on chronic HFrEF (heart failure with reduced ejection fraction) (HCC)   Pleural effusion on right   Status post thoracentesis   Heart failure with preserved ejection fraction (HCC)   Stage 3 chronic kidney disease (HCC)  Acute on chronic respiratory failure with hypoxia due to acute on chronic CHF  possible pneumonia, bilateral small pleural effusion- as seen on chest CT Not on oxygen supplementation at baseline Was previously on room air Saturation acutely worsened on 7/15 Did not respond to IV Lasix pushes on 7/15 Uop 2.3 liters last 24 hours Less likely pe given therapeutic anticoagulation Plan: Continue lasix gtt Continue hfnc, seems stable on  this Ct of chest ordered Oxygen as tolerated Consider broadening abx if this appears to be infectious. No fever, leukocytosis  Normocytic anemia Thrombocytopenia Hgb stable around 8 and platelets  have down-trended to 120-140s - monitor for now  AKI on Chronic kidney disease, stage 3a (HCC): Creatinine has been acutely worsening.  Peaked at 3.82.  Relatively stable now in the 3s. .  Urine output has been inadequate Plan: Lasix gtt. Nephrology following closely.  Consider initiation of hemodialysis  Acute on chronic diastolic CHF- Markedly net negative over course of admission.  Fluid retention worsened on 7/15.    Plan: Lasix  gtt. at 8 mg/h, will re-engage cardiology   Pleural effusion IR unable to perform thoracentesis 6/20, 6/21. Medical management.     Atrial fibrillation, chronic (HCC) Heart rate controlled.  On diltiazem and metoprolol per cardiology.  On Eliquis.  Monitor for bleeding.   Recent hx of MSSA bacteremia vertebral osteomyelitis (HCC): Received Ancef until 7/11.  This was stopped and doxycycline 100 mg p.o. twice daily x 6 weeks started per ID recommendations.  PICC line removed 7/12.  Recent penetrating atherosclerotic ulcer Repaired by vascular surgery 07/2022   CAD  Myocardial injury  HLD:  Evidence of ACS Continue Crestor, beta-blocker, Eliquis No plans for inpatient ischemic evaluation    GERD PPI  Ileus Noted on KUB 7/15.  Likely due to poor level of mobility.  Encourage ambulation as tolerated.  Bowel regimen escalated.    DVT prophylaxis: Eliquis Code Status: DNR Family Communication: Spouse at bedside 7/17 Disposition Plan: Status is: Inpatient Remains inpatient appropriate because: Recurrent hypoxia   Level of care: Progressive  Consultants:  Nephrology  Procedures:  None  Antimicrobials: Doxycycline   Subjective: Seen and examined.  Wife remains at bedside.  Breathing stable  Requiring high flow nasal cannula 10 L.   wife requests transfer to [facility]  Objective: Vitals:   09/18/22 0321 09/18/22 0419 09/18/22 0820 09/18/22 1200  BP:  122/70 126/66 (!) 140/69  Pulse: 71 68 73 73  Resp:  18 20 (!) 25  Temp:  98 F  (36.7 C) 98 F (36.7 C) 98.2 F (36.8 C)  TempSrc:  Axillary    SpO2: 97% 99% 96% 94%  Weight:  88.3 kg    Height:        Intake/Output Summary (Last 24 hours) at 09/18/2022 1244 Last data filed at 09/18/2022 0710 Gross per 24 hour  Intake 265.88 ml  Output 2000 ml  Net -1734.12 ml   Filed Weights   09/14/22 0352 09/16/22 0300 09/18/22 0419  Weight: 87.8 kg 90 kg 88.3 kg    Examination:  General exam: Appears fatigued Respiratory system: Scattered crackles bilaterally.  Normal work of breathing.  10 L Cardiovascular system: S1-S2, RRR, no murmurs, 2+ pedal edema Gastrointestinal system: Soft, NT/ND, normal bowel sounds Central nervous system: Alert and oriented. No focal neurological deficits. Extremities: Decreased power bilateral lower extremities Skin: No rashes, lesions or ulcers Psychiatry: Judgement and insight appear normal. Mood & affect appropriate.     Data Reviewed: I have personally reviewed following labs and imaging studies  CBC: Recent Labs  Lab 09/12/22 0755 09/16/22 0814 09/17/22 0924  WBC 7.1 7.9 5.9  NEUTROABS 4.8 4.7 3.9  HGB 8.0* 8.3* 7.8*  HCT 25.6* 25.7* 25.6*  MCV 92.1 91.5 97.0  PLT 124* 141* 130*   Basic Metabolic Panel: Recent Labs  Lab 09/12/22 0755 09/16/22 0814 09/17/22 0924  NA 140 142 140  K 3.4*  3.5 3.6  CL 106 108 104  CO2 23 24 22   GLUCOSE 83 97 104*  BUN 84* 84* 84*  CREATININE 3.64* 3.71* 3.66*  CALCIUM 8.4* 8.8* 8.6*   GFR: Estimated Creatinine Clearance: 16.5 mL/min (A) (by C-G formula based on SCr of 3.66 mg/dL (H)). Liver Function Tests: No results for input(s): "AST", "ALT", "ALKPHOS", "BILITOT", "PROT", "ALBUMIN" in the last 168 hours. No results for input(s): "LIPASE", "AMYLASE" in the last 168 hours. No results for input(s): "AMMONIA" in the last 168 hours. Coagulation Profile: No results for input(s): "INR", "PROTIME" in the last 168 hours. Cardiac Enzymes: No results for input(s): "CKTOTAL", "CKMB",  "CKMBINDEX", "TROPONINI" in the last 168 hours. BNP (last 3 results) No results for input(s): "PROBNP" in the last 8760 hours. HbA1C: No results for input(s): "HGBA1C" in the last 72 hours. CBG: No results for input(s): "GLUCAP" in the last 168 hours. Lipid Profile: No results for input(s): "CHOL", "HDL", "LDLCALC", "TRIG", "CHOLHDL", "LDLDIRECT" in the last 72 hours. Thyroid Function Tests: No results for input(s): "TSH", "T4TOTAL", "FREET4", "T3FREE", "THYROIDAB" in the last 72 hours. Anemia Panel: No results for input(s): "VITAMINB12", "FOLATE", "FERRITIN", "TIBC", "IRON", "RETICCTPCT" in the last 72 hours. Sepsis Labs: No results for input(s): "PROCALCITON", "LATICACIDVEN" in the last 168 hours.  No results found for this or any previous visit (from the past 240 hour(s)).       Radiology Studies: DG Chest Port 1 View  Result Date: 09/17/2022 CLINICAL DATA:  Respiratory distress and hypoxia EXAM: PORTABLE CHEST 1 VIEW COMPARISON:  Chest radiograph 1 day prior FINDINGS: The cardiomediastinal silhouette is stable Diffusely increased interstitial markings throughout both lungs again seen with slight interval improvement in the right lung. There is no significant pleural effusion. There is no pneumothorax. There is no acute osseous abnormality. IMPRESSION: Diffusely increased interstitial markings throughout both lungs with slight interval improvement in the right lung since the study from 1 day prior. Electronically Signed   By: Lesia Hausen M.D.   On: 09/17/2022 12:10   DG Abd 1 View  Result Date: 09/16/2022 CLINICAL DATA:  Abdominal pain EXAM: ABDOMEN - 1 VIEW COMPARISON:  None FINDINGS: There is gaseous distention of small-bowel loops and colon. There is previous intravascular stent repair in abdominal aorta. Increased interstitial markings in the lower lung fields suggest possible scarring. Arterial calcifications are seen. Degenerative changes are noted in lumbar spine. IMPRESSION:  There is mild gaseous dilation of small bowel loops. There is moderate to marked gaseous distention in colon. Findings suggest possible ileus. Arteriosclerosis. Previous endovascular stent repair in aorta. Possible scarring in the lower lung fields. Lumbar spondylosis. Electronically Signed   By: Ernie Avena M.D.   On: 09/16/2022 13:24        Scheduled Meds:  acidophilus  1 capsule Oral Daily   apixaban  2.5 mg Oral BID   Chlorhexidine Gluconate Cloth  6 each Topical Daily   cyanocobalamin  1,000 mcg Oral Daily   diltiazem  360 mg Oral Daily   doxycycline  100 mg Oral Q12H   feeding supplement  237 mL Oral BID BM   liver oil-zinc oxide   Topical TID   metoprolol succinate  100 mg Oral Daily   multivitamins with iron  1 tablet Oral Daily   pantoprazole  40 mg Oral Daily   polyethylene glycol  17 g Oral Daily   rosuvastatin  10 mg Oral Daily   senna-docusate  1 tablet Oral BID   Continuous Infusions:  sodium chloride  5 mL/hr at 09/17/22 1724   furosemide (LASIX) 200 mg in dextrose 5 % 100 mL (2 mg/mL) infusion 8 mg/hr (09/17/22 1724)     LOS: 31 days     Silvano Bilis, MD [facility]   If 7PM-7AM, please contact night-coverage  09/18/2022, 12:44 PM

## 2022-09-18 NOTE — Progress Notes (Addendum)
PT Cancellation Note  Patient Details Name: Brett Riley MRN: [id number] DOB: [birth year]   Cancelled Treatment:    Reason Eval/Treat Not Completed: Other (comment) Pt not available at this time. PT to re-attempt as able.   Lala Lund, PT, SPT  10:39 AM,09/18/22

## 2022-09-18 NOTE — Plan of Care (Signed)
  Problem: Education: Goal: Ability to demonstrate management of disease process will improve Outcome: Progressing Goal: Ability to verbalize understanding of medication therapies will improve Outcome: Progressing   Problem: Activity: Goal: Capacity to carry out activities will improve Outcome: Progressing   Problem: Cardiac: Goal: Ability to achieve and maintain adequate cardiopulmonary perfusion will improve Outcome: Progressing   Problem: Activity: Goal: Ability to tolerate increased activity will improve Outcome: Progressing   Problem: Clinical Measurements: Goal: Ability to maintain a body temperature in the normal range will improve Outcome: Progressing   Problem: Respiratory: Goal: Ability to maintain a clear airway will improve Outcome: Progressing

## 2022-09-18 NOTE — Progress Notes (Signed)
OT Cancellation Note  Patient Details Name: AITAN ROSSBACH MRN: [id number] DOB: 06-27-1935   Cancelled Treatment:    Reason Eval/Treat Not Completed: Other (comment). Pt having just worked with physical therapy and is very fatigued. OT will re-attempt when pt is next available.    Jackquline Denmark, MS, OTR/L , CBIS ascom [phone]  09/18/22, 3:02 PM

## 2022-09-18 NOTE — Progress Notes (Signed)
Pharmacy Antibiotic Note  Brett Riley is a 87 y.o. male admitted on 08/18/2022 with pneumonia, AKI and acute on chronic CHF. PMH includes CKD-3a. Pharmacy has been consulted for Zosyn (piperacillin/tazobactam) dosing.  Plan: Zosyn 3.375 grams every 8 hours  Height: 6' (182.9 cm) Weight: 88.3 kg (194 lb 10.7 oz) IBW/kg (Calculated) : 77.6  Temp (24hrs), Avg:98 F (36.7 C), Min:97.5 F (36.4 C), Max:98.2 F (36.8 C)  Recent Labs  Lab 09/12/22 0755 09/16/22 0814 09/17/22 0924 09/18/22 1436  WBC 7.1 7.9 5.9 6.4  CREATININE 3.64* 3.71* 3.66* 3.89*    Estimated Creatinine Clearance: 15.5 mL/min (A) (by C-G formula based on SCr of 3.89 mg/dL (H)).    No Known Allergies  Antimicrobials this admission: Zosyn 7/17 >>  Doxycycline 7/11 >>   Dose adjustments this admission: N/a  Microbiology results: 6/19 BCx: NG x 5 days 6/20 Sputum: normal respiratory flora    Thank you for allowing pharmacy to be a part of this patient's care.  Elliot Gurney, PharmD, BCPS Clinical Pharmacist  09/18/2022 5:16 PM

## 2022-09-18 NOTE — Progress Notes (Signed)
Progress Note  Patient Name: Brett Riley Date of Encounter: 09/18/2022  Primary Cardiologist: Kirke Corin  Subjective   We were asked to reevaluate the patient this afternoon in the setting of progressive hypoxia over the past 48 hours requiring escalation of supplemental oxygen, BiPAP, and currently high flow nasal cannula along with IV Lasix pushes and initiation of Lasix drip.  We had last rounded on the patient on 08/28/2022 at which time he was stable with much of his residual edema secondary to third spacing in the setting of severe hypoalbuminemia and anemia with diuretics being managed by nephrology.  Documentation indicates the patient was awaiting placement to skilled nursing facility when he became progressively hypoxic on 7/15 requiring supplemental oxygen as outlined above.  Most recent labs from 7/16 show a BUN of 84 and a serum creatinine 3.66 which is consistent with readings dating back at least 9 days ago.  Hemoglobin 7.8 and trending from 8.3 the day prior.  Last albumin 2.2 on 7/1.  With initiation of Lasix drip, documented urine output 2 L for the past 24 hours with a net -10.8 L for the admission (minimal documented).  Patient has continued to have intermittent back pain throughout the admission and was working with physical therapy, making it up to the edge of the bed.  He required assistance with a lift to transition to recliner.  Currently on supplemental oxygen via HFNC.  He denies any chest pain and reports stable dyspnea.  Lower extremity swelling persists.  Labs for today remain pending.  Inpatient Medications    Scheduled Meds:  acidophilus  1 capsule Oral Daily   apixaban  2.5 mg Oral BID   Chlorhexidine Gluconate Cloth  6 each Topical Daily   cyanocobalamin  1,000 mcg Oral Daily   diltiazem  360 mg Oral Daily   doxycycline  100 mg Oral Q12H   feeding supplement  237 mL Oral BID BM   liver oil-zinc oxide   Topical TID   metoprolol succinate  100 mg Oral Daily    multivitamins with iron  1 tablet Oral Daily   pantoprazole  40 mg Oral Daily   polyethylene glycol  17 g Oral Daily   rosuvastatin  10 mg Oral Daily   senna-docusate  1 tablet Oral BID   Continuous Infusions:  sodium chloride 5 mL/hr at 09/17/22 1724   furosemide (LASIX) 200 mg in dextrose 5 % 100 mL (2 mg/mL) infusion 8 mg/hr (09/17/22 1724)   PRN Meds: sodium chloride, acetaminophen, albuterol, bisacodyl, dextromethorphan-guaiFENesin, melatonin, ondansetron (ZOFRAN) IV, simethicone, sodium chloride, traMADol   Vital Signs    Vitals:   09/18/22 0321 09/18/22 0419 09/18/22 0820 09/18/22 1200  BP:  122/70 126/66 (!) 140/69  Pulse: 71 68 73 73  Resp:  18 20 (!) 25  Temp:  98 F (36.7 C) 98 F (36.7 C) 98.2 F (36.8 C)  TempSrc:  Axillary    SpO2: 97% 99% 96% 94%  Weight:  88.3 kg    Height:        Intake/Output Summary (Last 24 hours) at 09/18/2022 1538 Last data filed at 09/18/2022 0710 Gross per 24 hour  Intake 265.88 ml  Output 2000 ml  Net -1734.12 ml   Filed Weights   09/14/22 0352 09/16/22 0300 09/18/22 0419  Weight: 87.8 kg 90 kg 88.3 kg    Telemetry    Not on tele - Personally Reviewed  ECG    No new tracings - Personally Reviewed  Physical Exam  GEN: No acute distress.  Chronically ill-appearing.  Somnolent.  Neck: JVD elevated approximately 8 to 10 cm. Cardiac: RRR, I/VI systolic murmur LSB, no rubs, or gallops.  Respiratory: Mildly diminished and coarse breath sounds bilaterally.  On HFNC. GI: Soft, nontender, non-distended.   MS: 1+ lower extremity pitting edema to the knees with compression socks in place; No deformity. Neuro:  Alert and oriented x 3; Nonfocal.  Psych: Normal affect.  Labs    Chemistry Recent Labs  Lab 09/16/22 0814 09/17/22 0924 09/18/22 1436  NA 142 140 140  K 3.5 3.6 3.5  CL 108 104 103  CO2 24 22 26   GLUCOSE 97 104* 110*  BUN 84* 84* 95*  CREATININE 3.71* 3.66* 3.89*  CALCIUM 8.8* 8.6* 8.5*  ALBUMIN  --   --   2.5*  GFRNONAA 15* 16* 15*  ANIONGAP 10 14 11      Hematology Recent Labs  Lab 09/16/22 0814 09/17/22 0924 09/18/22 1436  WBC 7.9 5.9 6.4  RBC 2.81* 2.64* 2.72*  HGB 8.3* 7.8* 8.0*  HCT 25.7* 25.6* 25.2*  MCV 91.5 97.0 92.6  MCH 29.5 29.5 29.4  MCHC 32.3 30.5 31.7  RDW 16.1* 16.2* 15.8*  PLT 141* 130* 152    Cardiac EnzymesNo results for input(s): "TROPONINI" in the last 168 hours. No results for input(s): "TROPIPOC" in the last 168 hours.   BNPNo results for input(s): "BNP", "PROBNP" in the last 168 hours.   DDimer No results for input(s): "DDIMER" in the last 168 hours.   Radiology    DG Chest Port 1 View  Result Date: 09/17/2022 IMPRESSION: Diffusely increased interstitial markings throughout both lungs with slight interval improvement in the right lung since the study from 1 day prior. Electronically Signed   By: Lesia Hausen M.D.   On: 09/17/2022 12:10    Cardiac Studies   Limited echo 08/19/2022: 1. Left ventricular ejection fraction, by estimation, is 50 to 55%. The  left ventricle has low normal function. Left ventricular endocardial  border not optimally defined to evaluate regional wall motion.   2. Right ventricular systolic function is normal.  __________  TEE 08/05/2022: 1. Left ventricular ejection fraction, by estimation, is 55 to 60%. The  left ventricle has normal function.   2. Right ventricular systolic function is normal. The right ventricular  size is normal.   3. No left atrial/left atrial appendage thrombus was detected.   4. The mitral valve is normal in structure. Mild mitral valve  regurgitation.   5. The aortic valve is tricuspid. Aortic valve regurgitation is trivial.   Conclusion(s)/Recommendation(s): No evidence of vegetation/infective  endocarditis on this transesophageael echocardiogram.  __________  2D echo 07/22/2022: 1. Left ventricular ejection fraction, by estimation, is 55 to 60%. The  left ventricle has normal function. The  left ventricle has no regional  wall motion abnormalities. Left ventricular diastolic parameters are  consistent with Grade I diastolic  dysfunction (impaired relaxation).   2. Right ventricular systolic function is normal. The right ventricular  size is normal. There is normal pulmonary artery systolic pressure.   3. The mitral valve is normal in structure. Mild to moderate mitral valve  regurgitation. No evidence of mitral stenosis. Moderate mitral annular  calcification.   4. The aortic valve is normal in structure. Aortic valve regurgitation is  trivial. Aortic valve sclerosis is present, with no evidence of aortic  valve stenosis.   5. The inferior vena cava is normal in size with greater than 50%  respiratory  variability, suggesting right atrial pressure of 3 mmHg.   Patient Profile     87 y.o. 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  Assessment & Plan    1.  Acute hypoxic respiratory failure with acute on chronic HFpEF: -Multifactorial including volume overload, pleural effusions, possible pneumonia, anemia, and physical deconditioning -Volume up -Remains unclear at this time what precipitated his progressive hypoxia 7/15 requiring increase in supplemental oxygen, BiPAP, and now HFNC -Agree with Lasix drip, would look to augment diuresis with metolazone if able  based on updated labs today -Obtain echo to evaluate for new cardiomyopathy or valvulopathy -If he continues to require significant supplemental oxygen despite escalation in diuresis, or if renal function becomes more tenuous he may benefit from RHC to further understand hemodynamics -Ultimately may need to pursue hemodialysis to assist with fluid removal pending renal function trend -Cannot exclude some degree of third spacing contributing as well, will update albumin -CT of the chest pending -PE less likely given he has been maintained on therapeutic anticoagulation, not currently a CTA candidate given renal dysfunction (could consider VQ scan to look for chronic PE, if clinically indicated - evaluation of RV on updated echo and potential RHC can assist with this)  2.  PAF: -Not currently on telemetry -Regular rate and rhythm by auscultation -Remains on diltiazem and Toprol-XL -CHA2DS2-VASc at least -Continue apixaban 2.5 mg twice daily (age and serum creatinine)  3.  NSTEMI versus demand ischemia: -High-sensitivity troponin peaked at 1775 last month -Denies symptoms of chest pain -IV heparin was previously discontinued for rectal bleeding -Not currently inpatient ischemic evaluation candidate given significant comorbidities -Apixaban in lieu of aspirin given underlying A-fib -Rosuvastatin  4.  Acute on CKD stage IIIa: -Close monitoring of renal function with diuresis -Nephrology on board  5.  MSSA bacteremia with vertebral osteomyelitis: -Ongoing management per primary service and  6. Penetrating atherosclerotic aortic ulcer: -Status post endovascular stenting by vascular surgery in 07/2022  7.  Normocytic anemia with thrombocytopenia: -Maintain hemoglobin goal above 8, likely contributing to dyspnea -Platelet count trending down to 130 today -Ongoing management per primary service      For questions or updates, please contact [facility] Please consult [email or web address] for  contact info under Cardiology/STEMI.    Signed, Eula Listen, PA-C [facility] Pager: [phone] 09/18/2022, 3:38 PM

## 2022-09-18 NOTE — Progress Notes (Addendum)
Physical Therapy Treatment Patient Details Name: Brett Riley MRN: [id number] DOB: June 10, 1935 Today's Date: 09/18/2022   History of Present Illness Brett Riley is a 87 y/o M comes to [facility] 6/16 with respiratory failure; PMH includes MI, CHF, a-fib, HTN, anemia. 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    PT Comments  Pt in bed, family present. Pt reports feeling fatigued, deferred mobility but agreeable to exercises in bed. Pt performed therex in bed, able to complete with rest breaks between sets. SPO2 during session 86-91, therex in bed 86-89, at rest 87-91, pt on 10LO2 throughout session. Pt repositioned and left in bed, needs within reach, and alarm set. Pt would benefit from continued PT interventions to improve mobility, functional activity tolerance, and maximize return to PLOF.     Assistance Recommended at Discharge Frequent or constant Supervision/Assistance  If plan is discharge home, recommend the following:  Can travel by private vehicle    Assistance with cooking/housework;Assist for transportation;Two people to help with walking and/or transfers;Two people to help with bathing/dressing/bathroom;Help with stairs or ramp for entrance;Direct supervision/assist for medications management   No  Equipment Recommendations  Other (comment) (TBD next venue)    Recommendations for Other Services       Precautions / Restrictions Precautions Precautions: Fall Restrictions Weight Bearing Restrictions: No     Mobility  Bed Mobility               General bed mobility comments: deferred mobility d/t pt fatigue    Transfers                        Ambulation/Gait                   Stairs             Wheelchair Mobility     Tilt Bed    Modified Rankin (Stroke Patients  Only)       Balance Overall balance assessment: Needs assistance                                          Cognition Arousal/Alertness: Awake/alert Behavior During Therapy: WFL for tasks assessed/performed Overall Cognitive Status: Within Functional Limits for tasks assessed                                          Exercises Total Joint Exercises Ankle Circles/Pumps: AROM, Both, 20 reps, Supine Short Arc Quad: AROM, Both, 10 reps, Supine Heel Slides: AROM, Both, 5 reps, Supine    General Comments General comments (skin integrity, edema, etc.): SPO2 during session 86-91, therex in bed 86-89, at rest 88-91. pt on 10LO2 throughout session      Pertinent Vitals/Pain Pain Assessment Pain Assessment: Faces Faces Pain Scale: Hurts little more Pain Location: low back Pain Descriptors / Indicators: Guarding, Discomfort Pain Intervention(s): Limited activity within patient's tolerance, Monitored during session, Repositioned    Home Living                          Prior Function  PT Goals (current goals can now be found in the care plan section) Progress towards PT goals: Progressing toward goals    Frequency    Min 1X/week      PT Plan Current plan remains appropriate    Co-evaluation              AM-PAC PT "6 Clicks" Mobility   Outcome Measure  Help needed turning from your back to your side while in a flat bed without using bedrails?: A Lot Help needed moving from lying on your back to sitting on the side of a flat bed without using bedrails?: A Lot Help needed moving to and from a bed to a chair (including a wheelchair)?: A Lot Help needed standing up from a chair using your arms (e.g., wheelchair or bedside chair)?: Total Help needed to walk in hospital room?: Total Help needed climbing 3-5 steps with a railing? : Total 6 Click Score: 9    End of Session   Activity Tolerance: Patient limited by  fatigue;No increased pain Patient left: in bed;with call bell/phone within reach;with bed alarm set Nurse Communication: Mobility status PT Visit Diagnosis: Muscle weakness (generalized) (M62.81);Difficulty in walking, not elsewhere classified (R26.2);Unsteadiness on feet (R26.81);Other abnormalities of gait and mobility (R26.89)     Time: 6387-5643 PT Time Calculation (min) (ACUTE ONLY): 18 min  Charges:    $Therapeutic Exercise: 8-22 mins PT General Charges $$ ACUTE PT VISIT: 1 Visit                    Lala Lund, PT, SPT  3:53 PM,09/18/22

## 2022-09-18 NOTE — Progress Notes (Signed)
[facility]  ROUNDING NOTE   Subjective:   Patient sitting up in bed Alert and oriented Wife at bedside Wife feels he isn't doing well, patient states he's fair  Remains on 10L HFNC Lower extremity edema remains Witnessed drowsiness during visit  Creatinine 3.66 UOP 2.3L   Objective:  Vital signs in last 24 hours:  Temp:  [97.5 F (36.4 C)-98.2 F (36.8 C)] 98.2 F (36.8 C) (07/17 1200) Pulse Rate:  [65-73] 73 (07/17 1200) Resp:  [16-25] 25 (07/17 1200) BP: (116-140)/(63-70) 140/69 (07/17 1200) SpO2:  [92 %-99 %] 94 % (07/17 1200) FiO2 (%):  [45 %-60 %] 45 % (07/17 0321) Weight:  [88.3 kg] 88.3 kg (07/17 0419)  Weight change:  Filed Weights   09/14/22 0352 09/16/22 0300 09/18/22 0419  Weight: 87.8 kg 90 kg 88.3 kg    Intake/Output: I/O last 3 completed shifts: In: 505.9 [P.O.:240; I.V.:265.9] Out: 3000 [Urine:3000]   Intake/Output this shift:  Total I/O In: -  Out: 400 [Urine:400]  Physical Exam: General: NAD, laying in bed  Head: Normocephalic, atraumatic. Moist oral mucosal membranes  Eyes: Anicteric  Lungs:  Bilateral crackles, HFNC  Heart: Regular rate and rhythm  Abdomen:  +distended, nontender  Extremities:  3+ peripheral edema.  TED hose  Neurologic: Arousable, moving all four extremities  Skin: No lesions  Access: none    Basic Metabolic Panel: Recent Labs  Lab 09/12/22 0755 09/16/22 0814 09/17/22 0924  NA 140 142 140  K 3.4* 3.5 3.6  CL 106 108 104  CO2 23 24 22   GLUCOSE 83 97 104*  BUN 84* 84* 84*  CREATININE 3.64* 3.71* 3.66*  CALCIUM 8.4* 8.8* 8.6*    Liver Function Tests: No results for input(s): "AST", "ALT", "ALKPHOS", "BILITOT", "PROT", "ALBUMIN" in the last 168 hours.  No results for input(s): "LIPASE", "AMYLASE" in the last 168 hours. No results for input(s): "AMMONIA" in the last 168 hours.  CBC: Recent Labs  Lab 09/12/22 0755 09/16/22 0814 09/17/22 0924  WBC 7.1 7.9 5.9  NEUTROABS 4.8 4.7 3.9   HGB 8.0* 8.3* 7.8*  HCT 25.6* 25.7* 25.6*  MCV 92.1 91.5 97.0  PLT 124* 141* 130*    Cardiac Enzymes: No results for input(s): "CKTOTAL", "CKMB", "CKMBINDEX", "TROPONINI" in the last 168 hours.   BNP: Invalid input(s): "POCBNP"  CBG: No results for input(s): "GLUCAP" in the last 168 hours.   Microbiology: Results for orders placed or performed during the hospital encounter of 08/18/22  Blood culture (routine x 2)     Status: None   Collection Time: 08/18/22 10:45 AM   Specimen: BLOOD LEFT ARM  Result Value Ref Range Status   Specimen Description BLOOD LEFT ARM  Final   Special Requests   Final    BOTTLES DRAWN AEROBIC AND ANAEROBIC Blood Culture adequate volume   Culture   Final    NO GROWTH 5 DAYS Performed at [facility], [address] [place], [place] [address]    Report Status 08/23/2022 FINAL  Final  Resp Panel by RT-PCR (Flu A&B, Covid) Anterior Nasal Swab     Status: None   Collection Time: 08/18/22 10:46 AM   Specimen: Anterior Nasal Swab  Result Value Ref Range Status   SARS Coronavirus 2 by RT PCR NEGATIVE NEGATIVE Final    Comment: (NOTE) SARS-CoV-2 target nucleic acids are NOT DETECTED.  The SARS-CoV-2 RNA is generally detectable in upper respiratory specimens during the acute phase of infection. The lowest concentration of SARS-CoV-2 viral  copies this assay can detect is 138 copies/mL. A negative result does not preclude SARS-Cov-2 infection and should not be used as the sole basis for treatment or other patient management decisions. A negative result may occur with  improper specimen collection/handling, submission of specimen other than nasopharyngeal swab, presence of viral mutation(s) within the areas targeted by this assay, and inadequate number of viral copies(<138 copies/mL). A negative result must be combined with clinical observations, patient history, and epidemiological information. The expected result is Negative.  Fact Sheet  for Patients:  BloggerCourse.com  Fact Sheet for Healthcare Providers:  SeriousBroker.it  This test is no t yet approved or cleared by the [place] FDA and  has been authorized for detection and/or diagnosis of SARS-CoV-2 by FDA under an Emergency Use Authorization (EUA). This EUA will remain  in effect (meaning this test can be used) for the duration of the COVID-19 declaration under Section 564(b)(1) of the Act, 21 U.S.C.section 360bbb-3(b)(1), unless the authorization is terminated  or revoked sooner.       Influenza A by PCR NEGATIVE NEGATIVE Final   Influenza B by PCR NEGATIVE NEGATIVE Final    Comment: (NOTE) The Xpert Xpress SARS-CoV-2/FLU/RSV plus assay is intended as an aid in the diagnosis of influenza from Nasopharyngeal swab specimens and should not be used as a sole basis for treatment. Nasal washings and aspirates are unacceptable for Xpert Xpress SARS-CoV-2/FLU/RSV testing.  Fact Sheet for Patients: BloggerCourse.com  Fact Sheet for Healthcare Providers: SeriousBroker.it  This test is not yet approved or cleared by the [place] FDA and has been authorized for detection and/or diagnosis of SARS-CoV-2 by FDA under an Emergency Use Authorization (EUA). This EUA will remain in effect (meaning this test can be used) for the duration of the COVID-19 declaration under Section 564(b)(1) of the Act, 21 U.S.C. section 360bbb-3(b)(1), unless the authorization is terminated or revoked.  Performed at [facility], [address] [place], [place] [address]   Culture, blood (Routine X 2) w Reflex to ID Panel     Status: None   Collection Time: 08/18/22  9:19 PM   Specimen: BLOOD LEFT ARM  Result Value Ref Range Status   Specimen Description BLOOD LEFT ARM  Final   Special Requests   Final    BOTTLES DRAWN AEROBIC AND ANAEROBIC Blood Culture adequate  volume   Culture   Final    NO GROWTH 5 DAYS Performed at [facility], [address] [place], [place] [address]    Report Status 08/23/2022 FINAL  Final  Respiratory (~20 pathogens) panel by PCR     Status: None   Collection Time: 08/21/22  1:50 PM   Specimen: Nasopharyngeal Swab; Respiratory  Result Value Ref Range Status   Adenovirus NOT DETECTED NOT DETECTED Final   Coronavirus 229E NOT DETECTED NOT DETECTED Final    Comment: (NOTE) The Coronavirus on the Respiratory Panel, DOES NOT test for the novel  Coronavirus (2019 nCoV)    Coronavirus HKU1 NOT DETECTED NOT DETECTED Final   Coronavirus NL63 NOT DETECTED NOT DETECTED Final   Coronavirus OC43 NOT DETECTED NOT DETECTED Final   Metapneumovirus NOT DETECTED NOT DETECTED Final   Rhinovirus / Enterovirus NOT DETECTED NOT DETECTED Final   Influenza A NOT DETECTED NOT DETECTED Final   Influenza B NOT DETECTED NOT DETECTED Final   Parainfluenza Virus 1 NOT DETECTED NOT DETECTED Final   Parainfluenza Virus 2 NOT DETECTED NOT DETECTED Final   Parainfluenza Virus 3 NOT DETECTED  NOT DETECTED Final   Parainfluenza Virus 4 NOT DETECTED NOT DETECTED Final   Respiratory Syncytial Virus NOT DETECTED NOT DETECTED Final   Bordetella pertussis NOT DETECTED NOT DETECTED Final   Bordetella Parapertussis NOT DETECTED NOT DETECTED Final   Chlamydophila pneumoniae NOT DETECTED NOT DETECTED Final   Mycoplasma pneumoniae NOT DETECTED NOT DETECTED Final    Comment: Performed at [facility], [address], [place], [place] [address]  Culture, blood (Routine X 2) w Reflex to ID Panel     Status: None   Collection Time: 08/21/22  8:58 PM   Specimen: BLOOD  Result Value Ref Range Status   Specimen Description BLOOD LEFT WRIST  Final   Special Requests   Final    BOTTLES DRAWN AEROBIC AND ANAEROBIC Blood Culture adequate volume   Culture   Final    NO GROWTH 5 DAYS Performed at [facility], [address]  [place], [place] [address]    Report Status 08/26/2022 FINAL  Final  Culture, blood (Routine X 2) w Reflex to ID Panel     Status: None   Collection Time: 08/21/22 11:01 PM   Specimen: BLOOD  Result Value Ref Range Status   Specimen Description BLOOD BLOOD LEFT ARM  Final   Special Requests   Final    BOTTLES DRAWN AEROBIC AND ANAEROBIC Blood Culture adequate volume   Culture   Final    NO GROWTH 5 DAYS Performed at [facility], [address], [place], [place] [address]    Report Status 08/26/2022 FINAL  Final  Expectorated Sputum Assessment w Gram Stain, Rflx to Resp Cult     Status: None   Collection Time: 08/22/22 11:22 AM   Specimen: Sputum  Result Value Ref Range Status   Specimen Description SPUTUM  Final   Special Requests EXPSU  Final   Sputum evaluation   Final    THIS SPECIMEN IS ACCEPTABLE FOR SPUTUM CULTURE Performed at [facility], [address] [place], [place] [address]    Report Status 08/22/2022 FINAL  Final  Culture, Respiratory w Gram Stain     Status: None   Collection Time: 08/22/22 11:22 AM   Specimen: SPU  Result Value Ref Range Status   Specimen Description   Final    SPUTUM Performed at [facility], [address], [place], [place] [address]    Special Requests   Final    EXPSU Reflexed from [id number] Performed at [facility], [address] [place], [place] [address]    Gram Stain   Final    ABUNDANT SQUAMOUS EPITHELIAL CELLS PRESENT FEW WBC PRESENT, PREDOMINANTLY PMN ABUNDANT GRAM VARIABLE ROD ABUNDANT GRAM POSITIVE COCCI IN PAIRS    Culture   Final    Normal respiratory flora-no Staph aureus or Pseudomonas seen Performed at [facility], [address], [place], [place] [address]    Report Status 08/25/2022 FINAL  Final    Coagulation Studies: No results for input(s): "LABPROT", "INR" in the last 72 hours.  Urinalysis: No results for input(s): "COLORURINE", "LABSPEC", "PHURINE", "GLUCOSEU",  "HGBUR", "BILIRUBINUR", "KETONESUR", "PROTEINUR", "UROBILINOGEN", "NITRITE", "LEUKOCYTESUR" in the last 72 hours.  Invalid input(s): "APPERANCEUR"     Imaging: DG Chest Port 1 View  Result Date: 09/17/2022 CLINICAL DATA:  Respiratory distress and hypoxia EXAM: PORTABLE CHEST 1 VIEW COMPARISON:  Chest radiograph 1 day prior FINDINGS: The cardiomediastinal silhouette is stable Diffusely increased interstitial markings throughout both lungs again seen with slight  interval improvement in the right lung. There is no significant pleural effusion. There is no pneumothorax. There is no acute osseous abnormality. IMPRESSION: Diffusely increased interstitial markings throughout both lungs with slight interval improvement in the right lung since the study from 1 day prior. Electronically Signed   By: Lesia Hausen M.D.   On: 09/17/2022 12:10   DG Abd 1 View  Result Date: 09/16/2022 CLINICAL DATA:  Abdominal pain EXAM: ABDOMEN - 1 VIEW COMPARISON:  None FINDINGS: There is gaseous distention of small-bowel loops and colon. There is previous intravascular stent repair in abdominal aorta. Increased interstitial markings in the lower lung fields suggest possible scarring. Arterial calcifications are seen. Degenerative changes are noted in lumbar spine. IMPRESSION: There is mild gaseous dilation of small bowel loops. There is moderate to marked gaseous distention in colon. Findings suggest possible ileus. Arteriosclerosis. Previous endovascular stent repair in aorta. Possible scarring in the lower lung fields. Lumbar spondylosis. Electronically Signed   By: Ernie Avena M.D.   On: 09/16/2022 13:24     Medications:    sodium chloride 5 mL/hr at 09/17/22 1724   furosemide (LASIX) 200 mg in dextrose 5 % 100 mL (2 mg/mL) infusion 8 mg/hr (09/17/22 1724)    acidophilus  1 capsule Oral Daily   apixaban  2.5 mg Oral BID   Chlorhexidine Gluconate Cloth  6 each Topical Daily   cyanocobalamin  1,000 mcg Oral  Daily   diltiazem  360 mg Oral Daily   doxycycline  100 mg Oral Q12H   feeding supplement  237 mL Oral BID BM   liver oil-zinc oxide   Topical TID   metoprolol succinate  100 mg Oral Daily   multivitamins with iron  1 tablet Oral Daily   pantoprazole  40 mg Oral Daily   polyethylene glycol  17 g Oral Daily   rosuvastatin  10 mg Oral Daily   senna-docusate  1 tablet Oral BID   sodium chloride, acetaminophen, albuterol, bisacodyl, dextromethorphan-guaiFENesin, melatonin, ondansetron (ZOFRAN) IV, simethicone, sodium chloride, traMADol  Assessment/ Plan:  [name] is a 87 y.o.  male with recent prolonged admission for aortic ulcer s/p endovascular repair 07/24/22, MSSA bacteremia with L spine osteomyelitis and discitis, chronic diastolic heart failure, hypertension, hyperlipidemia, coronary artery disease, atrial fibrillation who was admitted to [facility] on 08/18/2022 for HCAP (healthcare-associated pneumonia) [J18.9] Acute on chronic respiratory failure with hypoxia (HCC) [J96.21] Sepsis, due to unspecified organism, unspecified whether acute organ dysfunction present (HCC) [A41.9]     1.  Acute kidney injury with proteinuria and hematuria on baseline creatinine of 1.05 GFR > 60 on 07/02/22.  Secondary to ATN versus AIN from cefazolin. IV contrast exposure on 07/24/22. -No new labs available today _ Good UOP recorded on Furosemide drip however patient remains on HFNC.  2. Acute respiratory failure requiring HFNC. IV Furosemide drip with UOP 2.3L. Will order CT chest and ABG to evaluated respiratory status. Ileus seen on KUB.     3.  Acute on chronic diastolic heart failure. With LE edema  Ejection fraction 55 to 60% with grade 1 diastolic dysfunction.  4.  Anemia with renal failure:     Lab Results  Component Value Date   HGB 7.8 (L) 09/17/2022  Will continue to monitor     LOS: 31   7/17/202412:34 PM

## 2022-09-19 ENCOUNTER — Encounter: Payer: Self-pay | Admitting: Internal Medicine

## 2022-09-19 ENCOUNTER — Encounter
Admission: EM | Disposition: A | Discharge status: Home / Self Care | Payer: Self-pay | Source: Home / Self Care | Attending: Internal Medicine

## 2022-09-19 DIAGNOSIS — J9621 Acute and chronic respiratory failure with hypoxia: Secondary | ICD-10-CM | POA: Diagnosis not present

## 2022-09-19 DIAGNOSIS — I4819 Other persistent atrial fibrillation: Secondary | ICD-10-CM

## 2022-09-19 DIAGNOSIS — I5033 Acute on chronic diastolic (congestive) heart failure: Secondary | ICD-10-CM | POA: Diagnosis not present

## 2022-09-19 DIAGNOSIS — J9601 Acute respiratory failure with hypoxia: Secondary | ICD-10-CM

## 2022-09-19 HISTORY — PX: RIGHT HEART CATH: CATH118263

## 2022-09-19 LAB — CBC
HCT: 25.9 % — ABNORMAL LOW (ref 39.0–52.0)
Hemoglobin: 8.3 g/dL — ABNORMAL LOW (ref 13.0–17.0)
MCH: 29.1 pg (ref 26.0–34.0)
MCHC: 32 g/dL (ref 30.0–36.0)
MCV: 90.9 fL (ref 80.0–100.0)
Platelets: 165 10*3/uL (ref 150–400)
RBC: 2.85 MIL/uL — ABNORMAL LOW (ref 4.22–5.81)
RDW: 15.7 % — ABNORMAL HIGH (ref 11.5–15.5)
WBC: 6.4 10*3/uL (ref 4.0–10.5)
nRBC: 0 % (ref 0.0–0.2)

## 2022-09-19 LAB — C-REACTIVE PROTEIN: CRP: 21 mg/dL — ABNORMAL HIGH

## 2022-09-19 LAB — BASIC METABOLIC PANEL WITH GFR
Anion gap: 14 (ref 5–15)
BUN: 96 mg/dL — ABNORMAL HIGH (ref 8–23)
CO2: 26 mmol/L (ref 22–32)
Calcium: 8.8 mg/dL — ABNORMAL LOW (ref 8.9–10.3)
Chloride: 101 mmol/L (ref 98–111)
Creatinine, Ser: 3.93 mg/dL — ABNORMAL HIGH (ref 0.61–1.24)
GFR, Estimated: 14 mL/min — ABNORMAL LOW
Glucose, Bld: 106 mg/dL — ABNORMAL HIGH (ref 70–99)
Potassium: 3.5 mmol/L (ref 3.5–5.1)
Sodium: 141 mmol/L (ref 135–145)

## 2022-09-19 LAB — SEDIMENTATION RATE: Sed Rate: 127 mm/h — ABNORMAL HIGH (ref 0–20)

## 2022-09-19 LAB — SARS CORONAVIRUS 2 BY RT PCR: SARS Coronavirus 2 by RT PCR: NEGATIVE

## 2022-09-19 LAB — BRAIN NATRIURETIC PEPTIDE: B Natriuretic Peptide: 264.2 pg/mL — ABNORMAL HIGH (ref 0.0–100.0)

## 2022-09-19 SURGERY — RIGHT HEART CATH
Anesthesia: Moderate Sedation

## 2022-09-19 MED ORDER — SODIUM CHLORIDE 0.9 % IV SOLN: 250.0000 mL | INTRAVENOUS | Status: DC | PRN

## 2022-09-19 MED ORDER — SODIUM CHLORIDE 0.9% FLUSH: 3.0000 mL | INTRAVENOUS | Status: DC | PRN

## 2022-09-19 MED ORDER — SODIUM CHLORIDE 0.9% FLUSH
3.0000 mL | Freq: Two times a day (BID) | INTRAVENOUS | Status: DC
  Administered 2022-09-19 – 2022-09-23 (×9): 3 mL via INTRAVENOUS

## 2022-09-19 MED ORDER — HYDRALAZINE HCL 20 MG/ML IJ SOLN: 10.0000 mg | INTRAMUSCULAR | Status: AC | PRN

## 2022-09-19 MED ORDER — PIPERACILLIN-TAZOBACTAM 3.375 G IVPB
3.3750 g | Freq: Two times a day (BID) | INTRAVENOUS | Status: DC
  Administered 2022-09-19 – 2022-09-21 (×4): 3.375 g via INTRAVENOUS
  Filled 2022-09-19 (×4): qty 50

## 2022-09-19 MED ORDER — SODIUM CHLORIDE 0.9 % IV SOLN: INTRAVENOUS | Status: DC

## 2022-09-19 SURGICAL SUPPLY — 6 items
CATH SWAN GANZ 7F STRAIGHT (CATHETERS) ×1
DRAPE BRACHIAL (DRAPES) ×1
GLIDESHEATH SLENDER 7FR .021G (SHEATH) ×1
PACK CARDIAC CATH (CUSTOM PROCEDURE TRAY) ×1
PROTECTION STATION PRESSURIZED (MISCELLANEOUS) ×1
SET ATX-X65L (MISCELLANEOUS) ×1

## 2022-09-19 NOTE — Plan of Care (Signed)
  Problem: Education: Goal: Ability to demonstrate management of disease process will improve Outcome: Progressing Goal: Ability to verbalize understanding of medication therapies will improve Outcome: Progressing Goal: Individualized Educational Video(s) Outcome: Progressing   Problem: Activity: Goal: Capacity to carry out activities will improve Outcome: Progressing   Problem: Cardiac: Goal: Ability to achieve and maintain adequate cardiopulmonary perfusion will improve Outcome: Progressing   Problem: Activity: Goal: Ability to tolerate increased activity will improve Outcome: Progressing   Problem: Clinical Measurements: Goal: Ability to maintain a body temperature in the normal range will improve Outcome: Progressing

## 2022-09-19 NOTE — H&P (View-Only) (Signed)
Patient Name: DVANTE HANDS Date of Encounter: 09/19/2022 [facility] Cardiologist: Lorine Bears, MD   Interval Summary  .    Breathing is about the same as yesterday, with Mr. Banwart getting out of breath when being repositioned in bed.  His wife feels like his leg edema might be slightly better.  He denies chest pain.  He is complaining of low back and lower abdominal pain today.  Vital Signs .    Vitals:   09/19/22 0427 09/19/22 0434 09/19/22 0607 09/19/22 0838  BP: 128/62 (!) 85/62 (!) 146/73 (!) 141/71  Pulse: 76 97 76 73  Resp: 18 18 20 20   Temp: 98.2 F (36.8 C) 98.3 F (36.8 C)  98 F (36.7 C)  TempSrc: Oral   Oral  SpO2: 97% 98% 94% 97%  Weight:      Height:        Intake/Output Summary (Last 24 hours) at 09/19/2022 1124 Last data filed at 09/19/2022 1058 Gross per 24 hour  Intake 480 ml  Output 3075 ml  Net -2595 ml      09/19/2022   12:16 AM 09/18/2022    4:19 AM 09/16/2022    3:00 AM  Last 3 Weights  Weight (lbs) 178 lb 5.6 oz 194 lb 10.7 oz 198 lb 6.6 oz  Weight (kg) 80.9 kg 88.3 kg 90 kg      Telemetry/ECG    Not on telemetry.  Physical Exam .   GEN: No acute distress.   Neck: No JVD Cardiac: RRR, no murmurs, rubs, or gallops.  Respiratory: Mildly diminished breath sounds throughout, overall coarse breath sounds. GI: Soft, nontender, non-distended  MS: 1-2+ pitting edema in both calves and ankles with compression stockings in place.  Assessment & Plan .     Acute on chronic respiratory for earlier with hypoxia and acute on chronic HFpEF: Mr. Dibello continues to require significant oxygen supplementation.  His chest CT yesterday shows worsening by bilateral parenchymal opacities that could represent multifocal pneumonia or pulmonary edema.  While his physical exam suggests fluid retention, personal review of yesterday's echocardiogram indicates normal PA pressures and stable LVEF/nonsevere valvular heart disease.  I am concerned that  his respiratory decompensation may be from something other than cardiogenic pulmonary edema and that continued escalation of diuresis could further impair his renal function without improving his respiratory status.  I have spoken with Mr. Morefield and his wife at length regarding further evaluation options; we have agreed to proceed with right heart catheterization this afternoon to objectively characterize his filling pressures.  In the meantime, I would favor discontinuation of furosemide infusion.  I have also asked Dr. Karna Christmas to reassess Mr. Vanwyhe given his respiratory compensation.  I would have a low threshold for restarting corticosteroids for treatment of a potential inflammatory pneumonitis.  Ongoing antimicrobial therapy per primary team.  Acute kidney injury: Renal function remains severely impaired but not significantly different over the last 2 days with escalation of diuresis.  As above, I would favor holding furosemide for now pending right heart catheterization.  I suspect that some of his peripheral edema is due to third spacing related to his anemia and hypoalbuminemia.  He may benefit from careful diuresis with IV albumin to help mobilize his peripheral edema at some point.  Persistent atrial fibrillation: Mr. Jezewski is not on telemetry but by exam and telemetry from yesterday's echocardiogram, appears to remain in sinus rhythm.  Continue apixaban 2.5 mg twice daily based on age and renal  function, as well as current doses of metoprolol and diltiazem for rate control.  MSSA bacteremia and vertebral osteomyelitis: Ongoing management per ID/internal medicine.  Question if there is a role for repeat imaging of the spine given persistent low back pain.  Anemia and thrombocytopenia: Most likely due to chronic illness, including worsening renal failure.  Recommend transfusion to maintain hemoglobin greater than 8.  Continue monitoring of thrombocytopenia.  For questions or updates,  please contact [facility] Please consult [email or web address] for contact info under [facility].    Signed, Yvonne Kendall, MD

## 2022-09-19 NOTE — Progress Notes (Signed)
Patient unable to produce sputum at this time.

## 2022-09-19 NOTE — Progress Notes (Signed)
OT Cancellation Note  Patient Details Name: Brett Riley MRN: [id number] DOB: [birth year]   Cancelled Treatment:    Reason Eval/Treat Not Completed: Medical issues which prohibited therapy. Chart reviewed. Noted with worsening respiratory status and order for cardiac catheterization. Per MD, hold off on therapy today. Will re-attempt at later date/time as medically appropriate.   Arman Filter., MPH, MS, OTR/L ascom [phone] 09/19/22, 11:16 AM

## 2022-09-19 NOTE — Interval H&P Note (Signed)
History and Physical Interval Note:  09/19/2022 4:37 PM  Brett Riley  has presented today for surgery, with the diagnosis of acute on chronic HFpEF.  The various methods of treatment have been discussed with the patient and family. After consideration of risks, benefits and other options for treatment, the patient has consented to  Procedure(s): RIGHT HEART CATH (N/A) as a surgical intervention.  The patient's history has been reviewed, patient examined, no change in status, stable for surgery.  I have reviewed the patient's chart and labs.  Questions were answered to the patient's satisfaction.     [name]

## 2022-09-19 NOTE — Progress Notes (Signed)
PT Cancellation Note  Patient Details Name: AADIL SUR MRN: [id number] DOB: 03/02/1936   Cancelled Treatment:    Reason Eval/Treat Not Completed: Other (comment). PT to hold today, to re-attempt when pt is medically appropriate.    Olga Coaster PT, DPT 1:36 PM,09/19/22

## 2022-09-19 NOTE — Progress Notes (Signed)
PROGRESS NOTE    Brett Riley  [name] DOB: 03-16-1935 DOA: 08/18/2022 PCP: [place] Arthur, MD    Brief Narrative:  87 y.o. male with medical history significant of chronic dCHF, HTN, HLD, CAD, CKD-3a, anemia, chronic A fib on Eliquis, who presents 08/18/2022 to ED from rehab, chief complaint SOB.  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 Net IO Since Admission: -5,549.14 mL [08/20/22 1608]. Heparin stopped d/t rectal bleeding overnight.  06/19: euvolemic per cardio, d/c lasix, O2 requirement still high, CT chest reviewed, ?edema/atypical infection, given clinically worsening respiratory status have asked pulmonary and ID to consult. Have d/c amiodarone and ordered steroids in case amio toxicity.  06/20: ID and pulmonary assisting with the management.   6/22-6/25: patient feels clinically improved. Stable with oxygen via [place] and weaned down to 2-3L. Patient requiring judicial adjustments to balance between hypervolemia and renal injury with diuresis. Unable to pull fluid from lungs with thoracentesis so medical management is best option. His IV Abx to treat his vertebral osteomyelitis from previous admission continue unchanged and managed by ID.    6/28: Bed offered at [facility].  Authorization pending 7/1: No status changes.  Per patient's daughter he has been seeming more depressed.  I have offered outpatient referral to psychiatry.   7/2: Insurance authorization for LTAC continues to be pending   7/3 : Peer-to-peer review done.  Not a candidate for LTAC.   Approved for skilled nursing facility patient   7/9 : Medically stable and ready for discharge to skilled nursing facility 7/10: No acute events.  Pending insurance authorization  7/15: Acute hypoxia noted this morning.  Placed on high flow nasal cannula.  Pulmonary edema noted on check chest x-ray.  Lasix restarted.  7/16: Further deterioration of respiratory status.  Required initiation of BiPAP initiation of Lasix gtt.   Assessment & Plan:   Principal Problem:   Acute respiratory failure with hypoxia (HCC) Active Problems:   Acute on chronic heart failure with preserved ejection fraction (HFpEF) (HCC)   MSSA bacteremia   Vertebral osteomyelitis (HCC)   CAD (coronary artery disease)   Myocardial injury   Hypertension   Hyperlipidemia   Chronic kidney disease, stage 3a (HCC)   Normocytic anemia   Atrial fibrillation, chronic (HCC)   PVD (peripheral vascular disease) (HCC)   Paroxysmal atrial fibrillation (HCC)   HCAP (healthcare-associated pneumonia)   Sepsis (HCC)   Pleural effusion   Amiodarone pulmonary toxicity   Acute on chronic HFrEF (heart failure with reduced ejection fraction) (HCC)   Pleural effusion on right   Status post thoracentesis   Heart failure with preserved ejection fraction (HCC)   Stage 3 chronic kidney disease (HCC)  Acute on chronic respiratory failure with hypoxia due to acute on chronic CHF  possible pneumonia, bilateral small pleural effusion- as seen on chest CT Not on oxygen supplementation at baseline Was previously on room air Saturation acutely worsened on 7/15 Did not respond to IV Lasix pushes on 7/15 Uop 3.4 liters last 24 hours Less likely pe given therapeutic anticoagulation Plan: Continue lasix gtt, metolazone per cardiology Continue hfnc,  seems stable on this Abx broadened to zosyn Oxygen as tolerated RHC today, would re-engage pulm if this doesn't appear to be driven by heart failure  Normocytic anemia Thrombocytopenia Hgb  stable around 8 and platelets now stable around 150 - monitor for now  AKI on Chronic kidney disease, stage 3a (HCC): Creatinine has been acutely worsening.  Peaked at 3.82.  Relatively stable now in the 3s. .  Urine output has been good with lasix Plan: Lasix gtt. Nephrology following closely.  Consider initiation of hemodialysis  Acute on chronic diastolic CHF- Markedly net negative over course of admission.  Fluid retention worsened on 7/15.    Plan: Continue lasix/metolazone RHC today   Pleural effusion IR unable to perform thoracentesis 6/20, 6/21. Medical management.  No sig effusion on repeat CT 7/17   Atrial fibrillation, chronic (HCC) Heart rate controlled.  On diltiazem and metoprolol per cardiology.  On Eliquis.  Monitor for bleeding.   Recent hx of MSSA bacteremia vertebral osteomyelitis (HCC): Received Ancef until 7/11.  This was stopped and doxycycline 100 mg p.o. twice daily x 6 weeks started per ID recommendations.  PICC line removed 7/12.  Recent penetrating atherosclerotic ulcer Repaired by vascular surgery 07/2022   CAD  Myocardial injury  HLD:  Evidence of ACS Continue Crestor, beta-blocker, Eliquis No plans for inpatient ischemic evaluation    GERD PPI  Ileus resolved  DVT prophylaxis: Eliquis Code Status: DNR Family Communication: Spouse at bedside 7/18 Disposition Plan: Status is: Inpatient Remains inpatient appropriate because: Recurrent hypoxia   Level of care: Progressive  Consultants:  Nephrology  Procedures:  None  Antimicrobials: Doxycycline   Subjective: Seen and examined.  Wife remains at bedside.  Breathing stable  Requiring high flow nasal cannula 10 L.    Objective: Vitals:   09/19/22 0427 09/19/22 0434 09/19/22 0607 09/19/22 0838  BP: 128/62 (!) 85/62 (!) 146/73 (!) 141/71  Pulse: 76 97 76 73  Resp: 18 18 20 20   Temp: 98.2 F (36.8 C) 98.3 F (36.8 C)  98 F (36.7 C)  TempSrc: Oral   Oral  SpO2: 97% 98% 94% 97%   Weight:      Height:        Intake/Output Summary (Last 24 hours) at 09/19/2022 1136 Last data filed at 09/19/2022 1058 Gross per 24 hour  Intake 480 ml  Output 3075 ml  Net -2595 ml   Filed Weights   09/16/22 0300 09/18/22 0419 09/19/22 0016  Weight: 90 kg 88.3 kg 80.9 kg    Examination:  General exam: Appears fatigued Respiratory system: Scattered crackles bilaterally.  Normal work of breathing.  10 L Cardiovascular system: S1-S2, RRR, no murmurs, 2+ pedal edema Gastrointestinal system: Soft, NT/ND, normal bowel sounds Central nervous system: Alert and oriented. No focal neurological deficits. Extremities: Decreased power bilateral lower extremities Skin: No rashes, lesions or ulcers Psychiatry: Judgement and insight appear normal. Mood & affect appropriate.     Data Reviewed: I have personally reviewed following labs and imaging studies  CBC: Recent Labs  Lab 09/16/22 0814 09/17/22 0924 09/18/22 1436 09/19/22 0630  WBC 7.9 5.9 6.4 6.4  NEUTROABS 4.7 3.9  --   --   HGB 8.3* 7.8* 8.0* 8.3*  HCT 25.7* 25.6* 25.2* 25.9*  MCV 91.5 97.0 92.6 90.9  PLT 141* 130* 152 165   Basic Metabolic Panel: Recent Labs  Lab 09/16/22 0814 09/17/22 0924 09/18/22 1436 09/19/22 0630  NA 142 140 140 141  K 3.5 3.6 3.5 3.5  CL  108 104 103 101  CO2 24 22 26 26   GLUCOSE 97 104* 110* 106*  BUN 84* 84* 95* 96*  CREATININE 3.71* 3.66* 3.89* 3.93*  CALCIUM 8.8* 8.6* 8.5* 8.8*   GFR: Estimated Creatinine Clearance: 15.4 mL/min (A) (by C-G formula based on SCr of 3.93 mg/dL (H)). Liver Function Tests: Recent Labs  Lab 09/18/22 1436  ALBUMIN 2.5*   No results for input(s): "LIPASE", "AMYLASE" in the last 168 hours. No results for input(s): "AMMONIA" in the last 168 hours. Coagulation Profile: No results for input(s): "INR", "PROTIME" in the last 168 hours. Cardiac Enzymes: No results for input(s): "CKTOTAL", "CKMB", "CKMBINDEX", "TROPONINI" in the last 168 hours. BNP (last  3 results) No results for input(s): "PROBNP" in the last 8760 hours. HbA1C: No results for input(s): "HGBA1C" in the last 72 hours. CBG: No results for input(s): "GLUCAP" in the last 168 hours. Lipid Profile: No results for input(s): "CHOL", "HDL", "LDLCALC", "TRIG", "CHOLHDL", "LDLDIRECT" in the last 72 hours. Thyroid Function Tests: No results for input(s): "TSH", "T4TOTAL", "FREET4", "T3FREE", "THYROIDAB" in the last 72 hours. Anemia Panel: No results for input(s): "VITAMINB12", "FOLATE", "FERRITIN", "TIBC", "IRON", "RETICCTPCT" in the last 72 hours. Sepsis Labs: No results for input(s): "PROCALCITON", "LATICACIDVEN" in the last 168 hours.  No results found for this or any previous visit (from the past 240 hour(s)).       Radiology Studies: CT CHEST WO CONTRAST  Result Date: 09/18/2022 CLINICAL DATA:  Pneumonia respiratory illness EXAM: CT CHEST WITHOUT CONTRAST TECHNIQUE: Multidetector CT imaging of the chest was performed following the standard protocol without IV contrast. RADIATION DOSE REDUCTION: This exam was performed according to the departmental dose-optimization program which includes automated exposure control, adjustment of the mA and/or kV according to patient size and/or use of iterative reconstruction technique. COMPARISON:  Chest x-ray 09/17/2022, chest CT 09/05/2022, 08/21/2022 FINDINGS: Cardiovascular: Limited evaluation without intravenous contrast. Moderate aortic atherosclerosis. Redemonstrated contour abnormality of the ascending thoracic aorta just proximal to the arch, this measures 2.9 x 1.4 cm on series 2, image 59, previously 3 x 1.3 cm. Extensive coronary vascular calcification. Cardiomegaly. No pericardial effusion Mediastinum/Nodes: Midline trachea. No thyroid mass. Borderline mediastinal lymph nodes appear slightly increased in size, for example right paratracheal node measures 9 mm on series 2, image 46, previously 7 mm. Esophagus within normal limits  Lungs/Pleura: Small bilateral pleural effusions. Bilateral bronchiectasis. Multifocal bilateral heterogeneous ground-glass densities throughout the lungs, worse when compared to CT from 09/05/2022, improved since CT 08/21/2022. Upper Abdomen: No acute finding Musculoskeletal: No acute osseous abnormality. IMPRESSION: 1. Multifocal bilateral heterogeneous ground-glass densities throughout the lungs, worse when compared to CT from 09/05/2022, improved since CT 08/21/2022. Findings could be secondary to multifocal pneumonia, though there may be a component of associated pulmonary edema. Slight interval increase in mediastinal lymph nodes. 2. Cardiomegaly with small bilateral pleural effusions. 3. Redemonstrated contour abnormality of the ascending thoracic aorta either representing penetrating ulcer or aneurysm. Correlation with CT angiography when clinically feasible is suggested. 4. Aortic atherosclerosis. Aortic Atherosclerosis (ICD10-I70.0). Electronically Signed   By: Jasmine Pang M.D.   On: 09/18/2022 16:46        Scheduled Meds:  acidophilus  1 capsule Oral Daily   apixaban  2.5 mg Oral BID   Chlorhexidine Gluconate Cloth  6 each Topical Daily   cyanocobalamin  1,000 mcg Oral Daily   diltiazem  360 mg Oral Daily   doxycycline  100 mg Oral Q12H   feeding supplement  237 mL Oral BID  BM   liver oil-zinc oxide   Topical TID   metoprolol succinate  100 mg Oral Daily   multivitamins with iron  1 tablet Oral Daily   pantoprazole  40 mg Oral Daily   polyethylene glycol  17 g Oral Daily   rosuvastatin  10 mg Oral Daily   senna-docusate  1 tablet Oral BID   Continuous Infusions:  sodium chloride 10 mL/hr at 09/18/22 2051   furosemide (LASIX) 200 mg in dextrose 5 % 100 mL (2 mg/mL) infusion 8 mg/hr (09/18/22 1643)   piperacillin-tazobactam (ZOSYN)  IV       LOS: 32 days     Silvano Bilis, MD [facility]   If 7PM-7AM, please contact night-coverage  09/19/2022, 11:36 AM

## 2022-09-19 NOTE — Progress Notes (Signed)
[facility]  ROUNDING NOTE   Subjective:   Patient resting quietly Alert and oriented Brett Riley at bedside Patient states he feels well right now  Remains on 10L HFNC Lower extremity edema remains NPO for RHC  Creatinine 3.66 UOP 3475L   Objective:  Vital signs in last 24 hours:  Temp:  [97.8 F (36.6 C)-98.8 F (37.1 C)] 98.1 F (36.7 C) (07/18 1339) Pulse Rate:  [71-97] 77 (07/18 1339) Resp:  [18-20] 20 (07/18 1339) BP: (85-146)/(62-119) 134/119 (07/18 1339) SpO2:  [86 %-98 %] 97 % (07/18 1339) Weight:  [80.9 kg] 80.9 kg (07/18 1339)  Weight change: -7.4 kg Filed Weights   09/18/22 0419 09/19/22 0016 09/19/22 1339  Weight: 88.3 kg 80.9 kg 80.9 kg    Intake/Output: I/O last 3 completed shifts: In: -  Out: 4675 [Urine:4675]   Intake/Output this shift:  Total I/O In: 480 [P.O.:480] Out: 1150 [Urine:1150]  Physical Exam: General: NAD, laying in bed  Head: Normocephalic, atraumatic. Moist oral mucosal membranes  Eyes: Anicteric  Lungs:  Bilateral crackles, HFNC  Heart: Regular rate and rhythm  Abdomen:  +distended, nontender  Extremities:  3+ peripheral edema.  TED hose  Neurologic: Alert, moving all four extremities  Skin: No lesions  Access: none    Basic Metabolic Panel: Recent Labs  Lab 09/16/22 0814 09/17/22 0924 09/18/22 1436 09/19/22 0630  NA 142 140 140 141  K 3.5 3.6 3.5 3.5  CL 108 104 103 101  CO2 24 22 26 26   GLUCOSE 97 104* 110* 106*  BUN 84* 84* 95* 96*  CREATININE 3.71* 3.66* 3.89* 3.93*  CALCIUM 8.8* 8.6* 8.5* 8.8*    Liver Function Tests: Recent Labs  Lab 09/18/22 1436  ALBUMIN 2.5*    No results for input(s): "LIPASE", "AMYLASE" in the last 168 hours. No results for input(s): "AMMONIA" in the last 168 hours.  CBC: Recent Labs  Lab 09/16/22 0814 09/17/22 0924 09/18/22 1436 09/19/22 0630  WBC 7.9 5.9 6.4 6.4  NEUTROABS 4.7 3.9  --   --   HGB 8.3* 7.8* 8.0* 8.3*  HCT 25.7* 25.6* 25.2* 25.9*  MCV 91.5  97.0 92.6 90.9  PLT 141* 130* 152 165    Cardiac Enzymes: No results for input(s): "CKTOTAL", "CKMB", "CKMBINDEX", "TROPONINI" in the last 168 hours.   BNP: Invalid input(s): "POCBNP"  CBG: No results for input(s): "GLUCAP" in the last 168 hours.   Microbiology: Results for orders placed or performed during the hospital encounter of 08/18/22  Blood culture (routine x 2)     Status: None   Collection Time: 08/18/22 10:45 AM   Specimen: BLOOD LEFT ARM  Result Value Ref Range Status   Specimen Description BLOOD LEFT ARM  Final   Special Requests   Final    BOTTLES DRAWN AEROBIC AND ANAEROBIC Blood Culture adequate volume   Culture   Final    NO GROWTH 5 DAYS Performed at [facility], [address], [place], [place] [address]    Report Status 08/23/2022 FINAL  Final  Resp Panel by RT-PCR (Flu A&B, Covid) Anterior Nasal Swab     Status: None   Collection Time: 08/18/22 10:46 AM   Specimen: Anterior Nasal Swab  Result Value Ref Range Status   SARS Coronavirus 2 by RT PCR NEGATIVE NEGATIVE Final    Comment: (NOTE) SARS-CoV-2 target nucleic acids are NOT DETECTED.  The SARS-CoV-2 RNA is generally detectable in upper respiratory specimens during the acute phase of infection. The lowest concentration of  SARS-CoV-2 viral copies this assay can detect is 138 copies/mL. A negative result does not preclude SARS-Cov-2 infection and should not be used as the sole basis for treatment or other patient management decisions. A negative result may occur with  improper specimen collection/handling, submission of specimen other than nasopharyngeal swab, presence of viral mutation(s) within the areas targeted by this assay, and inadequate number of viral copies(<138 copies/mL). A negative result must be combined with clinical observations, patient history, and epidemiological information. The expected result is Negative.  Fact Sheet for Patients:   BloggerCourse.com  Fact Sheet for Healthcare Providers:  SeriousBroker.it  This test is no t yet approved or cleared by the [place] FDA and  has been authorized for detection and/or diagnosis of SARS-CoV-2 by FDA under an Emergency Use Authorization (EUA). This EUA will remain  in effect (meaning this test can be used) for the duration of the COVID-19 declaration under Section 564(b)(1) of the Act, 21 U.S.C.section 360bbb-3(b)(1), unless the authorization is terminated  or revoked sooner.       Influenza A by PCR NEGATIVE NEGATIVE Final   Influenza B by PCR NEGATIVE NEGATIVE Final    Comment: (NOTE) The Xpert Xpress SARS-CoV-2/FLU/RSV plus assay is intended as an aid in the diagnosis of influenza from Nasopharyngeal swab specimens and should not be used as a sole basis for treatment. Nasal washings and aspirates are unacceptable for Xpert Xpress SARS-CoV-2/FLU/RSV testing.  Fact Sheet for Patients: BloggerCourse.com  Fact Sheet for Healthcare Providers: SeriousBroker.it  This test is not yet approved or cleared by the [place] FDA and has been authorized for detection and/or diagnosis of SARS-CoV-2 by FDA under an Emergency Use Authorization (EUA). This EUA will remain in effect (meaning this test can be used) for the duration of the COVID-19 declaration under Section 564(b)(1) of the Act, 21 U.S.C. section 360bbb-3(b)(1), unless the authorization is terminated or revoked.  Performed at [facility], [address] [place], [place] [address]   Culture, blood (Routine X 2) w Reflex to ID Panel     Status: None   Collection Time: 08/18/22  9:19 PM   Specimen: BLOOD LEFT ARM  Result Value Ref Range Status   Specimen Description BLOOD LEFT ARM  Final   Special Requests   Final    BOTTLES DRAWN AEROBIC AND ANAEROBIC Blood Culture adequate volume    Culture   Final    NO GROWTH 5 DAYS Performed at [facility], [address] [place], [place] [address]    Report Status 08/23/2022 FINAL  Final  Respiratory (~20 pathogens) panel by PCR     Status: None   Collection Time: 08/21/22  1:50 PM   Specimen: Nasopharyngeal Swab; Respiratory  Result Value Ref Range Status   Adenovirus NOT DETECTED NOT DETECTED Final   Coronavirus 229E NOT DETECTED NOT DETECTED Final    Comment: (NOTE) The Coronavirus on the Respiratory Panel, DOES NOT test for the novel  Coronavirus (2019 nCoV)    Coronavirus HKU1 NOT DETECTED NOT DETECTED Final   Coronavirus NL63 NOT DETECTED NOT DETECTED Final   Coronavirus OC43 NOT DETECTED NOT DETECTED Final   Metapneumovirus NOT DETECTED NOT DETECTED Final   Rhinovirus / Enterovirus NOT DETECTED NOT DETECTED Final   Influenza A NOT DETECTED NOT DETECTED Final   Influenza B NOT DETECTED NOT DETECTED Final   Parainfluenza Virus 1 NOT DETECTED NOT DETECTED Final   Parainfluenza Virus 2 NOT DETECTED NOT DETECTED Final   Parainfluenza Virus 3  NOT DETECTED NOT DETECTED Final   Parainfluenza Virus 4 NOT DETECTED NOT DETECTED Final   Respiratory Syncytial Virus NOT DETECTED NOT DETECTED Final   Bordetella pertussis NOT DETECTED NOT DETECTED Final   Bordetella Parapertussis NOT DETECTED NOT DETECTED Final   Chlamydophila pneumoniae NOT DETECTED NOT DETECTED Final   Mycoplasma pneumoniae NOT DETECTED NOT DETECTED Final    Comment: Performed at [facility], [address], [place], [place] [address]  Culture, blood (Routine X 2) w Reflex to ID Panel     Status: None   Collection Time: 08/21/22  8:58 PM   Specimen: BLOOD  Result Value Ref Range Status   Specimen Description BLOOD LEFT WRIST  Final   Special Requests   Final    BOTTLES DRAWN AEROBIC AND ANAEROBIC Blood Culture adequate volume   Culture   Final    NO GROWTH 5 DAYS Performed at [facility], [address] [place], [place]  [address]    Report Status 08/26/2022 FINAL  Final  Culture, blood (Routine X 2) w Reflex to ID Panel     Status: None   Collection Time: 08/21/22 11:01 PM   Specimen: BLOOD  Result Value Ref Range Status   Specimen Description BLOOD BLOOD LEFT ARM  Final   Special Requests   Final    BOTTLES DRAWN AEROBIC AND ANAEROBIC Blood Culture adequate volume   Culture   Final    NO GROWTH 5 DAYS Performed at [facility], [address], [place], [place] [address]    Report Status 08/26/2022 FINAL  Final  Expectorated Sputum Assessment w Gram Stain, Rflx to Resp Cult     Status: None   Collection Time: 08/22/22 11:22 AM   Specimen: Sputum  Result Value Ref Range Status   Specimen Description SPUTUM  Final   Special Requests EXPSU  Final   Sputum evaluation   Final    THIS SPECIMEN IS ACCEPTABLE FOR SPUTUM CULTURE Performed at [facility], [address], [place], [place] [address]    Report Status 08/22/2022 FINAL  Final  Culture, Respiratory w Gram Stain     Status: None   Collection Time: 08/22/22 11:22 AM   Specimen: SPU  Result Value Ref Range Status   Specimen Description   Final    SPUTUM Performed at [facility], [address], [place], [place] [address]    Special Requests   Final    EXPSU Reflexed from [id number] Performed at [facility], [address] [place], [place] [address]    Gram Stain   Final    ABUNDANT SQUAMOUS EPITHELIAL CELLS PRESENT FEW WBC PRESENT, PREDOMINANTLY PMN ABUNDANT GRAM VARIABLE ROD ABUNDANT GRAM POSITIVE COCCI IN PAIRS    Culture   Final    Normal respiratory flora-no Staph aureus or Pseudomonas seen Performed at [facility], [address] [place], [place] [address]    Report Status 08/25/2022 FINAL  Final    Coagulation Studies: No results for input(s): "LABPROT", "INR" in the last 72 hours.  Urinalysis: No results for input(s): "COLORURINE", "LABSPEC", "PHURINE", "GLUCOSEU", "HGBUR",  "BILIRUBINUR", "KETONESUR", "PROTEINUR", "UROBILINOGEN", "NITRITE", "LEUKOCYTESUR" in the last 72 hours.  Invalid input(s): "APPERANCEUR"     Imaging: CT CHEST WO CONTRAST  Result Date: 09/18/2022 CLINICAL DATA:  Pneumonia respiratory illness EXAM: CT CHEST WITHOUT CONTRAST TECHNIQUE: Multidetector CT imaging of the chest was performed following the standard protocol without IV contrast. RADIATION DOSE REDUCTION: This exam was performed according  to the departmental dose-optimization program which includes automated exposure control, adjustment of the mA and/or kV according to patient size and/or use of iterative reconstruction technique. COMPARISON:  Chest x-ray 09/17/2022, chest CT 09/05/2022, 08/21/2022 FINDINGS: Cardiovascular: Limited evaluation without intravenous contrast. Moderate aortic atherosclerosis. Redemonstrated contour abnormality of the ascending thoracic aorta just proximal to the arch, this measures 2.9 x 1.4 cm on series 2, image 59, previously 3 x 1.3 cm. Extensive coronary vascular calcification. Cardiomegaly. No pericardial effusion Mediastinum/Nodes: Midline trachea. No thyroid mass. Borderline mediastinal lymph nodes appear slightly increased in size, for example right paratracheal node measures 9 mm on series 2, image 46, previously 7 mm. Esophagus within normal limits Lungs/Pleura: Small bilateral pleural effusions. Bilateral bronchiectasis. Multifocal bilateral heterogeneous ground-glass densities throughout the lungs, worse when compared to CT from 09/05/2022, improved since CT 08/21/2022. Upper Abdomen: No acute finding Musculoskeletal: No acute osseous abnormality. IMPRESSION: 1. Multifocal bilateral heterogeneous ground-glass densities throughout the lungs, worse when compared to CT from 09/05/2022, improved since CT 08/21/2022. Findings could be secondary to multifocal pneumonia, though there may be a component of associated pulmonary edema. Slight interval increase in  mediastinal lymph nodes. 2. Cardiomegaly with small bilateral pleural effusions. 3. Redemonstrated contour abnormality of the ascending thoracic aorta either representing penetrating ulcer or aneurysm. Correlation with CT angiography when clinically feasible is suggested. 4. Aortic atherosclerosis. Aortic Atherosclerosis (ICD10-I70.0). Electronically Signed   By: Jasmine Pang M.D.   On: 09/18/2022 16:46     Medications:    [MAR Hold] sodium chloride 10 mL/hr at 09/18/22 2051   [START ON 09/20/2022] sodium chloride 10 mL/hr at 09/19/22 1336   [MAR Hold] piperacillin-tazobactam (ZOSYN)  IV      [MAR Hold] acidophilus  1 capsule Oral Daily   [MAR Hold] apixaban  2.5 mg Oral BID   [MAR Hold] cyanocobalamin  1,000 mcg Oral Daily   [MAR Hold] diltiazem  360 mg Oral Daily   [MAR Hold] doxycycline  100 mg Oral Q12H   [MAR Hold] feeding supplement  237 mL Oral BID BM   [MAR Hold] liver oil-zinc oxide   Topical TID   [MAR Hold] metoprolol succinate  100 mg Oral Daily   [MAR Hold] multivitamins with iron  1 tablet Oral Daily   [MAR Hold] pantoprazole  40 mg Oral Daily   [MAR Hold] polyethylene glycol  17 g Oral Daily   [MAR Hold] rosuvastatin  10 mg Oral Daily   [MAR Hold] senna-docusate  1 tablet Oral BID   [MAR Hold] sodium chloride, [MAR Hold] acetaminophen, [MAR Hold] albuterol, [MAR Hold] bisacodyl, [MAR Hold] dextromethorphan-guaiFENesin, [MAR Hold] melatonin, [MAR Hold] ondansetron (ZOFRAN) IV, [MAR Hold] simethicone, [MAR Hold] sodium chloride, [MAR Hold] traMADol  Assessment/ Plan:  Brett Riley is a 87 y.o.  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  Acute kidney injury with proteinuria and hematuria on baseline creatinine of 1.05 GFR > 60 on 07/02/22.  Secondary to ATN versus AIN from cefazolin. IV contrast exposure on 07/24/22. -Creatinine 3.93 - Adequate urine output with Furosemide drip.   2. Acute respiratory failure requiring HFNC. IV Furosemide drip. Ileus seen  on KUB. CT chest suspicious for pneumonia. Cardiology planning RHC later today.     3.  Acute on chronic diastolic heart failure. With LE edema  Ejection fraction 55 to 60% with grade 1 diastolic dysfunction.  4.  Anemia with renal failure:     Lab Results  Component Value Date   HGB 8.3 (L) 09/19/2022  Will continue to monitor     LOS: 32   7/18/20242:02 PM

## 2022-09-19 NOTE — Progress Notes (Signed)
Patient Name: Brett Riley Date of Encounter: 09/19/2022 [facility] Cardiologist: Lorine Bears, MD   Interval Summary  .    Breathing is about the same as yesterday, with Brett Riley getting out of breath when being repositioned in bed.  His wife feels like his leg edema might be slightly better.  He denies chest pain.  He is complaining of low back and lower abdominal pain today.  Vital Signs .    Vitals:   09/19/22 0427 09/19/22 0434 09/19/22 0607 09/19/22 0838  BP: 128/62 (!) 85/62 (!) 146/73 (!) 141/71  Pulse: 76 97 76 73  Resp: 18 18 20 20   Temp: 98.2 F (36.8 C) 98.3 F (36.8 C)  98 F (36.7 C)  TempSrc: Oral   Oral  SpO2: 97% 98% 94% 97%  Weight:      Height:        Intake/Output Summary (Last 24 hours) at 09/19/2022 1124 Last data filed at 09/19/2022 1058 Gross per 24 hour  Intake 480 ml  Output 3075 ml  Net -2595 ml      09/19/2022   12:16 AM 09/18/2022    4:19 AM 09/16/2022    3:00 AM  Last 3 Weights  Weight (lbs) 178 lb 5.6 oz 194 lb 10.7 oz 198 lb 6.6 oz  Weight (kg) 80.9 kg 88.3 kg 90 kg      Telemetry/ECG    Not on telemetry.  Physical Exam .   GEN: No acute distress.   Neck: No JVD Cardiac: RRR, no murmurs, rubs, or gallops.  Respiratory: Mildly diminished breath sounds throughout, overall coarse breath sounds. GI: Soft, nontender, non-distended  MS: 1-2+ pitting edema in both calves and ankles with compression stockings in place.  Assessment & Plan .     Acute on chronic respiratory for earlier with hypoxia and acute on chronic HFpEF: Brett Riley continues to require significant oxygen supplementation.  His chest CT yesterday shows worsening by bilateral parenchymal opacities that could represent multifocal pneumonia or pulmonary edema.  While his physical exam suggests fluid retention, personal review of yesterday's echocardiogram indicates normal PA pressures and stable LVEF/nonsevere valvular heart disease.  I am concerned that  his respiratory decompensation may be from something other than cardiogenic pulmonary edema and that continued escalation of diuresis could further impair his renal function without improving his respiratory status.  I have spoken with Brett Riley and his wife at length regarding further evaluation options; we have agreed to proceed with right heart catheterization this afternoon to objectively characterize his filling pressures.  In the meantime, I would favor discontinuation of furosemide infusion.  I have also asked Dr. Karna Christmas to reassess Brett Riley given his respiratory compensation.  I would have a low threshold for restarting corticosteroids for treatment of a potential inflammatory pneumonitis.  Ongoing antimicrobial therapy per primary team.  Acute kidney injury: Renal function remains severely impaired but not significantly different over the last 2 days with escalation of diuresis.  As above, I would favor holding furosemide for now pending right heart catheterization.  I suspect that some of his peripheral edema is due to third spacing related to his anemia and hypoalbuminemia.  He may benefit from careful diuresis with IV albumin to help mobilize his peripheral edema at some point.  Persistent atrial fibrillation: Brett Riley is not on telemetry but by exam and telemetry from yesterday's echocardiogram, appears to remain in sinus rhythm.  Continue apixaban 2.5 mg twice daily based on age and renal  function, as well as current doses of metoprolol and diltiazem for rate control.  MSSA bacteremia and vertebral osteomyelitis: Ongoing management per ID/internal medicine.  Question if there is a role for repeat imaging of the spine given persistent low back pain.  Anemia and thrombocytopenia: Most likely due to chronic illness, including worsening renal failure.  Recommend transfusion to maintain hemoglobin greater than 8.  Continue monitoring of thrombocytopenia.  For questions or updates,  please contact [facility] Please consult [email or web address] for contact info under [facility] Cardiology.    Signed, Yvonne Kendall, MD

## 2022-09-19 NOTE — Consult Note (Signed)
PULMONOLOGY         Date: 09/19/2022,   MRN# [id number] RAEQWON LUX 04/09/1935     AdmissionWeight: 91.9 kg                 CurrentWeight: 80.9 kg  Referring provider: Dr End   CHIEF COMPLAINT:   Acute on chronic hypoxemic respiratory failure   HISTORY OF PRESENT ILLNESS   This is a 87 year old with a history of skin cancer, carotid disease, celiac artery stenosis, coronary disease status post cardiac cath with findings multivessel disease and recommendation for coronary artery bypass, sensorineural hearing loss, systolic CHF, dyslipidemia, nephrolithiasis, hip osteoarthritis, atrial fibrillation who came into the hospital 32 days ago for acute on chronic hypoxemic respiratory failure after having an admission in May to June 2024 for aortic ulcer repair which was complicated by sepsis and Staph aureus bacteremia as well as osteomyelitis of the spine.  Patient completed full course of antimicrobials in July 2024.  He was found to be hypoxemic and initially required BiPAP diuresed and had chest physiotherapy with improvement to 2 to 3 L nasal cannula.  Was also seen by cardiology at medical management diuresed approximately 6 L which helped, however while waiting for LTAC approval apparently progressively became more dyspneic and hypoxemic despite ongoing diuresis.  PCCM consultation was placed for increased O2 requirement with acute on chronic hypoxemic respiratory failure.   PAST MEDICAL HISTORY   Past Medical History:  Diagnosis Date   Cancer [facility])    skin cancer basal   Carotid arterial disease (HCC)    a. 02/2016 L CEA; b. 01/2017 U/S: patent LICA, 1-39% RICA.   Celiac artery stenosis (HCC)    Coronary artery disease    a. 01/2016 MV: mild apical/basal inferior, apical lateral, mid anterolateral, and mid inferolateral ischemia. EF 57%; b. 02/2016 Cath: LM 40ost, LAD 70p/m, 20d, D1 95 (small), LCX nl, RCA 27m, RPDA 90 (small), EF 55-65%-->med Rx. Rec CABG for  recurrent symptoms.   Hearing loss    History of echocardiogram    a. 02/2016 Echo: EF 50-55%, no rwma, mild MR.   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   Osteoarthritis, hip, bilateral    Left > Right     SURGICAL HISTORY   Past Surgical History:  Procedure Laterality Date   AORTIC INTERVENTION N/A 07/24/2022   Procedure: AORTIC INTERVENTION;  Surgeon: Renford Dills, MD;  Location: [facility] INVASIVE CV LAB;  Service: Cardiovascular;  Laterality: N/A;   CARDIAC CATHETERIZATION N/A 01/19/2016   Procedure: Left Heart Cath and Coronary Angiography;  Surgeon: Iran Ouch, MD;  Location: [facility] INVASIVE CV LAB;  Service: Cardiovascular;  Laterality: N/A;   COLONOSCOPY     CYSTOSCOPY/URETEROSCOPY/HOLMIUM LASER/STENT PLACEMENT Right 04/13/2021   Procedure: CYSTOSCOPY/URETEROSCOPY/HOLMIUM LASER/STENT PLACEMENT;  Surgeon: Sondra Come, MD;  Location: [facility] ORS;  Service: Urology;  Laterality: Right;   ENDARTERECTOMY Left 02/15/2016   Procedure: ENDARTERECTOMY CAROTID;  Surgeon: Annice Needy, MD;  Location: [facility] ORS;  Service: Vascular;  Laterality: Left;   TEE WITHOUT CARDIOVERSION N/A 08/05/2022   Procedure: TRANSESOPHAGEAL ECHOCARDIOGRAM;  Surgeon: Debbe Odea, MD;  Location: [facility] ORS;  Service: Cardiovascular;  Laterality: N/A;     FAMILY HISTORY   Family History  Problem Relation Age of Onset   Stroke Mother    Heart disease Father        MI  Heart attack Father    Breast cancer Sister    Colon cancer Neg Hx    Prostate cancer Neg Hx      SOCIAL HISTORY   Social History   Tobacco Use   Smoking status: Never   Smokeless tobacco: Never  Vaping Use   Vaping status: Never Used  Substance Use Topics   Alcohol use: No    Alcohol/week: 0.0 standard drinks of alcohol   Drug use: No     MEDICATIONS    Home Medication:  Current Outpatient Rx   Order #: [id number] Class: Normal    Order #: [id number] Class: Normal   Order #: [id number] Class: Normal    Current Medication:  Current Facility-Administered Medications:    0.9 %  sodium chloride infusion, , Intravenous, PRN, Georgeann Oppenheim, Sudheer B, MD, Last Rate: 10 mL/hr at 09/18/22 2051, New Bag at 09/18/22 2051   [START ON 09/20/2022] 0.9 %  sodium chloride infusion, , Intravenous, Continuous, End, Christopher, MD   acetaminophen (TYLENOL) tablet 650 mg, 650 mg, Oral, Q6H PRN, Lorretta Harp, MD, 650 mg at 09/13/22 1713   acidophilus (RISAQUAD) capsule 1 capsule, 1 capsule, Oral, Daily, Sreenath, Sudheer B, MD, 1 capsule at 09/19/22 0949   albuterol (PROVENTIL) (2.5 MG/3ML) 0.083% nebulizer solution 2.5 mg, 2.5 mg, Nebulization, Q4H PRN, Lorretta Harp, MD, 2.5 mg at 09/16/22 0739   apixaban (ELIQUIS) tablet 2.5 mg, 2.5 mg, Oral, BID, [name], RPH, 2.5 mg at 09/19/22 8469   bisacodyl (DULCOLAX) suppository 10 mg, 10 mg, Rectal, Daily PRN, Georgeann Oppenheim, Sudheer B, MD   cyanocobalamin (VITAMIN B12) tablet 1,000 mcg, 1,000 mcg, Oral, Daily, Lorretta Harp, MD, 1,000 mcg at 09/19/22 0949   dextromethorphan-guaiFENesin (MUCINEX DM) 30-600 MG per 12 hr tablet 1 tablet, 1 tablet, Oral, BID PRN, Lorretta Harp, MD, 1 tablet at 08/24/22 0905   diltiazem (CARDIZEM CD) 24 hr capsule 360 mg, 360 mg, Oral, Daily, Gollan, Tollie Pizza, MD, 360 mg at 09/19/22 0947   doxycycline (VIBRA-TABS) tablet 100 mg, 100 mg, Oral, Q12H, Sreenath, Sudheer B, MD, 100 mg at 09/19/22 0949   feeding supplement (ENSURE ENLIVE / ENSURE PLUS) liquid 237 mL, 237 mL, Oral, BID BM, Lorretta Harp, MD, 237 mL at 09/18/22 1644   furosemide (LASIX) 200 mg in dextrose 5 % 100 mL (2 mg/mL) infusion, 8 mg/hr, Intravenous, Continuous, Sreenath, Sudheer B, MD, Last Rate: 4 mL/hr at 09/18/22 1643, 8 mg/hr at 09/18/22 1643   liver oil-zinc oxide (DESITIN) 40 % ointment, , Topical, TID, Georgeann Oppenheim, Sudheer B, MD, Given at 09/19/22 0951   melatonin tablet 2.5 mg, 2.5 mg, Oral, QHS PRN, Lindajo Royal V,  MD, 2.5 mg at 09/16/22 2148   metoprolol succinate (TOPROL-XL) 24 hr tablet 100 mg, 100 mg, Oral, Daily, Lorretta Harp, MD, 100 mg at 09/19/22 0946   multivitamins with iron tablet 1 tablet, 1 tablet, Oral, Daily, Sunnie Nielsen, DO, 1 tablet at 09/19/22 0945   ondansetron (ZOFRAN) injection 4 mg, 4 mg, Intravenous, Q8H PRN, Lorretta Harp, MD, 4 mg at 09/17/22 1754   pantoprazole (PROTONIX) EC tablet 40 mg, 40 mg, Oral, Daily, Lorretta Harp, MD, 40 mg at 09/19/22 0949   piperacillin-tazobactam (ZOSYN) IVPB 3.375 g, 3.375 g, Intravenous, Q12H, Chappell, Alex B, RPH   polyethylene glycol (MIRALAX / GLYCOLAX) packet 17 g, 17 g, Oral, Daily, Agbata, Tochukwu, MD, 17 g at 09/18/22 1037   rosuvastatin (CRESTOR) tablet 10 mg, 10 mg, Oral, Daily, Agbata, Tochukwu, MD, 10 mg at 09/19/22 0949   senna-docusate (Senokot-S) tablet  1 tablet, 1 tablet, Oral, BID, Georgeann Oppenheim, Sudheer B, MD, 1 tablet at 09/18/22 2136   simethicone (MYLICON) chewable tablet 80 mg, 80 mg, Oral, QID PRN, Mansy, Jan A, MD, 80 mg at 08/20/22 2121   sodium chloride (OCEAN) 0.65 % nasal spray 1 spray, 1 spray, Each Nare, PRN, Sunnie Nielsen, DO, 1 spray at 08/27/22 0319   traMADol (ULTRAM) tablet 50 mg, 50 mg, Oral, Q6H PRN, Lorretta Harp, MD, 50 mg at 09/18/22 1358    ALLERGIES   Patient has no known allergies.     REVIEW OF SYSTEMS    Review of Systems:  Gen:  Denies  fever, sweats, chills weigh loss  HEENT: Denies blurred vision, double vision, ear pain, eye pain, hearing loss, nose bleeds, sore throat Cardiac:  No dizziness, chest pain or heaviness, chest tightness,edema Resp:   reports dyspnea chronically  Gi: Denies swallowing difficulty, stomach pain, nausea or vomiting, diarrhea, constipation, bowel incontinence Gu:  Denies bladder incontinence, burning urine Ext:   Denies Joint pain, stiffness or swelling Skin: Denies  skin rash, easy bruising or bleeding or hives Endoc:  Denies polyuria, polydipsia , polyphagia or  weight change Psych:   Denies depression, insomnia or hallucinations   Other:  All other systems negative   VS: BP 130/67 (BP Location: Left Arm)   Pulse 75   Temp 98.5 F (36.9 C)   Resp 20   Ht 6' (1.829 m)   Wt 80.9 kg   SpO2 97%   BMI 24.19 kg/m      PHYSICAL EXAM    GENERAL:NAD, no fevers, chills, no weakness no fatigue HEAD: Normocephalic, atraumatic.  EYES: Pupils equal, round, reactive to light. Extraocular muscles intact. No scleral icterus.  MOUTH: Moist mucosal membrane. Dentition intact. No abscess noted.  EAR, NOSE, THROAT: Clear without exudates. No external lesions.  NECK: Supple. No thyromegaly. No nodules. No JVD.  PULMONARY: decreased breath sounds with mild rhonchi worse at bases bilaterally.  CARDIOVASCULAR: S1 and S2. Regular rate and rhythm. No murmurs, rubs, or gallops. No edema. Pedal pulses 2+ bilaterally.  GASTROINTESTINAL: Soft, nontender, nondistended. No masses. Positive bowel sounds. No hepatosplenomegaly.  MUSCULOSKELETAL: No swelling, clubbing, or edema. Range of motion full in all extremities.  NEUROLOGIC: Cranial nerves II through XII are intact. No gross focal neurological deficits. Sensation intact. Reflexes intact.  SKIN: No ulceration, lesions, rashes, or cyanosis. Skin warm and dry. Turgor intact.  PSYCHIATRIC: Mood, affect within normal limits. The patient is awake, alert and oriented x 3. Insight, judgment intact.       IMAGING     ASSESSMENT/PLAN    Acute on chronic hypoxemic respiratory failure - compared to previous CT chest 09/05/22 infiltrates have worsened with interstitial infiltrates bilaterally and bronchiectasis in the background.  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 Inflammatory biomarkers ordered -COVID and  RVP reswabbing -Agree with zosyn and bactrim        Thank you for allowing me to participate in the care of this patient.   Patient/Family are satisfied with care plan and all questions have been answered.    Provider disclosure: Patient with at least one acute or chronic illness or injury that poses a threat to life or bodily function and is being managed actively during this encounter.  All of the below  services have been performed independently by signing provider:  review of prior documentation from internal and or external health records.  Review of previous and current lab results.  Interview and comprehensive assessment during patient visit today. Review of current and previous chest radiographs/CT scans. Discussion of management and test interpretation with health care team and patient/family.   This document was prepared using Dragon voice recognition software and may include unintentional dictation errors.     Vida Rigger, M.D.  Division of Pulmonary & Critical Care Medicine

## 2022-09-19 NOTE — Progress Notes (Signed)
[facility]  Date: 09/19/22  Time: 4:40 PM  Mr. Levenhagen and his wife have agreed to rescind DNR order for duration of cardiac catheterization.  DNR status will resume after completion of the catheterization.  Yvonne Kendall, MD [facility]

## 2022-09-19 NOTE — TOC Progression Note (Signed)
Transition of Care [facility]) - Progression Note    Patient Details  Name: Brett Riley MRN: [id number] Date of Birth: [birth year]  Transition of [facility] CM/SW Contact  Darolyn Rua, [place] Phone Number: 09/19/2022, 9:21 AM  Clinical Narrative:     CSW notes patient with worsening respiratory status and 10L HFNC  Patient has insurance Auth approved for [employer] [id number], for 7/16-7/28 next review date 7/29, Marchelle Folks RN case manager clinicals can be faxed to [phone].    Pending medical improvements.   Expected Discharge Plan: Skilled Nursing Facility Barriers to Discharge: Continued Medical Work up, English as a second language teacher  Expected Discharge Plan and Services     Post Acute Care Choice: Skilled Nursing Facility Living arrangements for the past 2 months: Single Family Home                                       Social Determinants of Health (SDOH) Interventions SDOH Screenings   Food Insecurity: No Food Insecurity (08/18/2022)  Housing: Low Risk  (08/18/2022)  Transportation Needs: No Transportation Needs (08/18/2022)  Utilities: Not At Risk (08/18/2022)  Depression (PHQ2-9): Low Risk  (06/06/2022)  Financial Resource Strain: Low Risk  (08/29/2020)  Physical Activity: Insufficiently Active (08/29/2020)  Social Connections: Unknown (08/29/2020)  Stress: No Stress Concern Present (08/29/2020)  Tobacco Use: Low Risk  (09/05/2022)    Readmission Risk Interventions     No data to display

## 2022-09-20 ENCOUNTER — Encounter: Payer: Self-pay | Admitting: Internal Medicine

## 2022-09-20 DIAGNOSIS — J9601 Acute respiratory failure with hypoxia: Secondary | ICD-10-CM | POA: Diagnosis not present

## 2022-09-20 LAB — RESPIRATORY PANEL BY PCR

## 2022-09-20 LAB — CBC
HCT: 27.4 % — ABNORMAL LOW (ref 39.0–52.0)
Hemoglobin: 8.5 g/dL — ABNORMAL LOW (ref 13.0–17.0)
MCH: 28.6 pg (ref 26.0–34.0)
MCHC: 31 g/dL (ref 30.0–36.0)
MCV: 92.3 fL (ref 80.0–100.0)
Platelets: 174 10*3/uL (ref 150–400)
RBC: 2.97 MIL/uL — ABNORMAL LOW (ref 4.22–5.81)
RDW: 15.3 % (ref 11.5–15.5)
WBC: 5.5 10*3/uL (ref 4.0–10.5)
nRBC: 0 % (ref 0.0–0.2)

## 2022-09-20 LAB — ECHOCARDIOGRAM LIMITED
Area-P 1/2: 2.48 cm2
Height: 72 in
S' Lateral: 3.6 cm
Weight: 3114.66 [oz_av]

## 2022-09-20 LAB — BASIC METABOLIC PANEL WITH GFR
Anion gap: 16 — ABNORMAL HIGH (ref 5–15)
BUN: 99 mg/dL — ABNORMAL HIGH (ref 8–23)
CO2: 28 mmol/L (ref 22–32)
Calcium: 8.9 mg/dL (ref 8.9–10.3)
Chloride: 99 mmol/L (ref 98–111)
Creatinine, Ser: 4.25 mg/dL — ABNORMAL HIGH (ref 0.61–1.24)
GFR, Estimated: 13 mL/min — ABNORMAL LOW
Glucose, Bld: 124 mg/dL — ABNORMAL HIGH (ref 70–99)
Potassium: 3.1 mmol/L — ABNORMAL LOW (ref 3.5–5.1)
Sodium: 143 mmol/L (ref 135–145)

## 2022-09-20 LAB — MAGNESIUM: Magnesium: 1.9 mg/dL (ref 1.7–2.4)

## 2022-09-20 MED ORDER — METHYLPREDNISOLONE SODIUM SUCC 125 MG IJ SOLR
INTRAMUSCULAR | Status: AC
  Filled 2022-09-20: qty 2

## 2022-09-20 MED ORDER — METHYLPREDNISOLONE SODIUM SUCC 40 MG IJ SOLR
INTRAMUSCULAR | Status: AC
  Filled 2022-09-20: qty 1

## 2022-09-20 MED ORDER — POTASSIUM CHLORIDE CRYS ER 20 MEQ PO TBCR
60.0000 meq | EXTENDED_RELEASE_TABLET | Freq: Once | ORAL | Status: AC
  Administered 2022-09-20: 60 meq via ORAL
  Filled 2022-09-20 (×2): qty 3

## 2022-09-20 MED ORDER — METHYLPREDNISOLONE SODIUM SUCC 40 MG IJ SOLR
40.0000 mg | INTRAMUSCULAR | Status: DC
  Administered 2022-09-20 – 2022-09-23 (×4): 40 mg via INTRAVENOUS
  Filled 2022-09-20 (×3): qty 1

## 2022-09-20 NOTE — Progress Notes (Signed)
Physical Therapy Treatment Patient Details Name: Brett Riley MRN: [id number] DOB: 11/23/1935 Today's Date: 09/20/2022   History of Present Illness Brett Riley is a 87 y/o M comes to [facility] 6/16 with respiratory failure; PMH includes MI, CHF, a-fib, HTN, anemia. 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    PT Comments  Patient supine in bed on arrival with daughter present. Requires encouragement to participate in limited session 2/2 fatigue. Patient required mod-maxA+2 for bed mobility and maxA to maintain sitting balance at EOB due to significant posterior and R lateral lean. Cues required for utilizing bed rail to assist with anterior weight shift with poor follow through. Patient with difficulty finding midline while sitting EOB as well as reclined in bed. Unaware of posterior and R lateral lean. On 9-10L O2, spO2 decreases to 83% with minimal activity with slow increase back to 90% once returned to supine. Intermittent follow through with pursed lip breathing. Discharge plan remains appropriate.       Assistance Recommended at Discharge Frequent or constant Supervision/Assistance  If plan is discharge home, recommend the following:  Can travel by private vehicle    Assistance with cooking/housework;Assist for transportation;Two people to help with walking and/or transfers;Two people to help with bathing/dressing/bathroom;Help with stairs or ramp for entrance;Direct supervision/assist for medications management   No  Equipment Recommendations  Other (comment) (TBD next venue)    Recommendations for Other Services       Precautions / Restrictions Precautions Precautions: Fall Restrictions Weight Bearing Restrictions: No Other Position/Activity Restrictions: monitor SpO2     Mobility  Bed Mobility Overal bed  mobility: Needs Assistance Bed Mobility: Supine to Sit, Sit to Supine     Supine to sit: Mod assist, +2 for physical assistance, +2 for safety/equipment Sit to supine: Max assist, +2 for physical assistance, +2 for safety/equipment   General bed mobility comments: able to advance R LE but required assistance with L LE. Cues to utilize bed rail to assist with trunk elevation. Ultimately requiring modA+2 to complete bed mobility    Transfers                   General transfer comment: deferred 2/2 poor sitting balance and fatigue    Ambulation/Gait                   Stairs             Wheelchair Mobility     Tilt Bed    Modified Rankin (Stroke Patients Only)       Balance Overall balance assessment: Needs assistance Sitting-balance support: Feet supported, Bilateral upper extremity supported Sitting balance-Leahy Scale: Poor Sitting balance - Comments: significant posterior lean. Cues required for anterior weight shift with poor follow through. Demonstrates R lateral lean at times in sitting Postural control: Right lateral lean, Posterior lean                                  Cognition Arousal/Alertness: Awake/alert Behavior During Therapy: WFL for tasks assessed/performed Overall Cognitive Status: Within Functional Limits for tasks assessed  Exercises      General Comments General comments (skin integrity, edema, etc.): On 9-10L O2, spO2 decreased to 83% while sitting EOB. Slow increase back up to 90% upon return to supine      Pertinent Vitals/Pain Pain Assessment Pain Assessment: 0-10 Pain Score: 4  Pain Location: low back Pain Descriptors / Indicators: Guarding, Discomfort Pain Intervention(s): Limited activity within patient's tolerance, Monitored during session, Repositioned, Premedicated before session    Home Living                          Prior  Function            PT Goals (current goals can now be found in the care plan section) Acute Rehab PT Goals PT Goal Formulation: With patient/family Time For Goal Achievement: 09/26/22 Potential to Achieve Goals: Fair Progress towards PT goals: Progressing toward goals    Frequency    Min 1X/week      PT Plan Current plan remains appropriate    Co-evaluation              AM-PAC PT "6 Clicks" Mobility   Outcome Measure  Help needed turning from your back to your side while in a flat bed without using bedrails?: A Lot Help needed moving from lying on your back to sitting on the side of a flat bed without using bedrails?: Total Help needed moving to and from a bed to a chair (including a wheelchair)?: Total Help needed standing up from a chair using your arms (e.g., wheelchair or bedside chair)?: Total Help needed to walk in hospital room?: Total Help needed climbing 3-5 steps with a railing? : Total 6 Click Score: 7    End of Session Equipment Utilized During Treatment: Oxygen Activity Tolerance: Patient limited by fatigue Patient left: in bed;with call bell/phone within reach;with bed alarm set Nurse Communication: Mobility status PT Visit Diagnosis: Muscle weakness (generalized) (M62.81);Difficulty in walking, not elsewhere classified (R26.2);Unsteadiness on feet (R26.81);Other abnormalities of gait and mobility (R26.89)     Time: 7829-5621 PT Time Calculation (min) (ACUTE ONLY): 9 min  Charges:    $Therapeutic Activity: 8-22 mins PT General Charges $$ ACUTE PT VISIT: 1 Visit                     Brett Riley, PT, DPT Physical Therapist - [facility]  [facility]    Brett Riley 09/20/2022, 3:22 PM

## 2022-09-20 NOTE — Plan of Care (Signed)
  Problem: Education: Goal: Ability to demonstrate management of disease process will improve Outcome: Progressing Goal: Ability to verbalize understanding of medication therapies will improve Outcome: Progressing Goal: Individualized Educational Video(s) Outcome: Progressing   Problem: Activity: Goal: Capacity to carry out activities will improve Outcome: Progressing   Problem: Cardiac: Goal: Ability to achieve and maintain adequate cardiopulmonary perfusion will improve Outcome: Progressing   Problem: Activity: Goal: Ability to tolerate increased activity will improve Outcome: Progressing   Problem: Clinical Measurements: Goal: Ability to maintain a body temperature in the normal range will improve Outcome: Progressing   Problem: Respiratory: Goal: Ability to maintain adequate ventilation will improve Outcome: Progressing Goal: Ability to maintain a clear airway will improve Outcome: Progressing   Problem: Education: Goal: Knowledge of General Education information will improve Description: Including pain rating scale, medication(s)/side effects and non-pharmacologic comfort measures Outcome: Progressing   Problem: Health Behavior/Discharge Planning: Goal: Ability to manage health-related needs will improve Outcome: Progressing   Problem: Clinical Measurements: Goal: Ability to maintain clinical measurements within normal limits will improve Outcome: Progressing Goal: Will remain free from infection Outcome: Progressing Goal: Diagnostic test results will improve Outcome: Progressing Goal: Respiratory complications will improve Outcome: Progressing Goal: Cardiovascular complication will be avoided Outcome: Progressing   Problem: Activity: Goal: Risk for activity intolerance will decrease Outcome: Progressing   Problem: Nutrition: Goal: Adequate nutrition will be maintained Outcome: Progressing   Problem: Coping: Goal: Level of anxiety will  decrease Outcome: Progressing   Problem: Elimination: Goal: Will not experience complications related to bowel motility Outcome: Progressing Goal: Will not experience complications related to urinary retention Outcome: Progressing   Problem: Pain Managment: Goal: General experience of comfort will improve Outcome: Progressing   Problem: Safety: Goal: Ability to remain free from injury will improve Outcome: Progressing   Problem: Skin Integrity: Goal: Risk for impaired skin integrity will decrease Outcome: Progressing   Problem: Education: Goal: Understanding of CV disease, CV risk reduction, and recovery process will improve Outcome: Progressing Goal: Individualized Educational Video(s) Outcome: Progressing   Problem: Activity: Goal: Ability to return to baseline activity level will improve Outcome: Progressing   Problem: Cardiovascular: Goal: Ability to achieve and maintain adequate cardiovascular perfusion will improve Outcome: Progressing Goal: Vascular access site(s) Level 0-1 will be maintained Outcome: Progressing   Problem: Health Behavior/Discharge Planning: Goal: Ability to safely manage health-related needs after discharge will improve Outcome: Progressing

## 2022-09-20 NOTE — Progress Notes (Signed)
PULMONOLOGY         Date: 09/20/2022,   MRN# [id number] Brett Riley Jun 30, 1935     AdmissionWeight: 91.9 kg                 CurrentWeight: 80.9 kg  Referring provider: Dr End   CHIEF COMPLAINT:   Acute on chronic hypoxemic respiratory failure   HISTORY OF PRESENT ILLNESS   This is a 87 year old with a history of skin cancer, carotid disease, celiac artery stenosis, coronary disease status post cardiac cath with findings multivessel disease and recommendation for coronary artery bypass, sensorineural hearing loss, systolic CHF, dyslipidemia, nephrolithiasis, hip osteoarthritis, atrial fibrillation who came into the hospital 32 days ago for acute on chronic hypoxemic respiratory failure after having an admission in May to June 2024 for aortic ulcer repair which was complicated by sepsis and Staph aureus bacteremia as well as osteomyelitis of the spine.  Patient completed full course of antimicrobials in July 2024.  He was found to be hypoxemic and initially required BiPAP diuresed and had chest physiotherapy with improvement to 2 to 3 L nasal cannula.  Was also seen by cardiology at medical management diuresed approximately 6 L which helped, however while waiting for LTAC approval apparently progressively became more dyspneic and hypoxemic despite ongoing diuresis.  PCCM consultation was placed for increased O2 requirement with acute on chronic hypoxemic respiratory failure.  09/20/22- patient remains hypoxemic, this am able to eat breakfast with help from wife at bedside.  Inflammatory biomarkers show marked elevation, this generally is not seen with fluid imbalance.  Viral workup was negative however he may still have some viral LRTI that is not screened on our platform or other cause of inflammation.  There is definite bronchiectasis on CT chest with ground glass infiltrates with antero posterior gradient seen with ARDS vs acute exacerbation of ILD/bronchiectasis.  Plan to  restart systemic steroids with solumedrol today at 40mg  IV.    PAST MEDICAL HISTORY   Past Medical History:  Diagnosis Date   Cancer [facility])    skin cancer basal   Carotid arterial disease (HCC)    a. 02/2016 L CEA; b. 01/2017 U/S: patent LICA, 1-39% RICA.   Celiac artery stenosis (HCC)    Coronary artery disease    a. 01/2016 MV: mild apical/basal inferior, apical lateral, mid anterolateral, and mid inferolateral ischemia. EF 57%; b. 02/2016 Cath: LM 40ost, LAD 70p/m, 20d, D1 95 (small), LCX nl, RCA 7m, RPDA 90 (small), EF 55-65%-->med Rx. Rec CABG for recurrent symptoms.   Hearing loss    History of echocardiogram    a. 02/2016 Echo: EF 50-55%, no rwma, mild MR.   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   Osteoarthritis, hip, bilateral    Left > Right     SURGICAL HISTORY   Past Surgical History:  Procedure Laterality Date   AORTIC INTERVENTION N/A 07/24/2022   Procedure: AORTIC INTERVENTION;  Surgeon: Renford Dills, MD;  Location: [facility] INVASIVE CV LAB;  Service: Cardiovascular;  Laterality: N/A;   CARDIAC CATHETERIZATION N/A 01/19/2016   Procedure: Left Heart Cath and Coronary Angiography;  Surgeon: Iran Ouch, MD;  Location: [facility] INVASIVE CV LAB;  Service: Cardiovascular;  Laterality: N/A;   COLONOSCOPY     CYSTOSCOPY/URETEROSCOPY/HOLMIUM LASER/STENT PLACEMENT Right 04/13/2021   Procedure: CYSTOSCOPY/URETEROSCOPY/HOLMIUM LASER/STENT PLACEMENT;  Surgeon: Sondra Come, MD;  Location: [facility] ORS;  Service: Urology;  Laterality: Right;   ENDARTERECTOMY Left 02/15/2016   Procedure: ENDARTERECTOMY CAROTID;  Surgeon: Annice Needy, MD;  Location: [facility] ORS;  Service: Vascular;  Laterality: Left;   TEE WITHOUT CARDIOVERSION N/A 08/05/2022   Procedure: TRANSESOPHAGEAL ECHOCARDIOGRAM;  Surgeon: Debbe Odea, MD;  Location: [facility] ORS;  Service: Cardiovascular;  Laterality: N/A;     FAMILY  HISTORY   Family History  Problem Relation Age of Onset   Stroke Mother    Heart disease Father        MI   Heart attack Father    Breast cancer Sister    Colon cancer Neg Hx    Prostate cancer Neg Hx      SOCIAL HISTORY   Social History   Tobacco Use   Smoking status: Never   Smokeless tobacco: Never  Vaping Use   Vaping status: Never Used  Substance Use Topics   Alcohol use: No    Alcohol/week: 0.0 standard drinks of alcohol   Drug use: No     MEDICATIONS    Home Medication:  Current Outpatient Rx   Order #: [id number] Class: Normal   Order #: [id number] Class: Normal   Order #: [id number] Class: Normal    Current Medication:  Current Facility-Administered Medications:    0.9 %  sodium chloride infusion, , Intravenous, PRN, End, Christopher, MD, Last Rate: 10 mL/hr at 09/20/22 0700, Infusion Verify at 09/20/22 0700   0.9 %  sodium chloride infusion, 250 mL, Intravenous, PRN, End, Cristal Deer, MD   acetaminophen (TYLENOL) tablet 650 mg, 650 mg, Oral, Q6H PRN, End, Christopher, MD, 650 mg at 09/13/22 1713   acidophilus (RISAQUAD) capsule 1 capsule, 1 capsule, Oral, Daily, End, Christopher, MD, 1 capsule at 09/19/22 0949   albuterol (PROVENTIL) (2.5 MG/3ML) 0.083% nebulizer solution 2.5 mg, 2.5 mg, Nebulization, Q4H PRN, End, Christopher, MD, 2.5 mg at 09/16/22 0739   apixaban (ELIQUIS) tablet 2.5 mg, 2.5 mg, Oral, BID, End, Christopher, MD, 2.5 mg at 09/19/22 2151   bisacodyl (DULCOLAX) suppository 10 mg, 10 mg, Rectal, Daily PRN, End, Cristal Deer, MD   cyanocobalamin (VITAMIN B12) tablet 1,000 mcg, 1,000 mcg, Oral, Daily, End, Christopher, MD, 1,000 mcg at 09/19/22 0949   dextromethorphan-guaiFENesin (MUCINEX DM) 30-600 MG per 12 hr tablet 1 tablet, 1 tablet, Oral, BID PRN, End, Christopher, MD, 1 tablet at 08/24/22 0905   diltiazem (CARDIZEM CD) 24 hr capsule 360 mg, 360 mg, Oral, Daily, End, Christopher, MD, 360 mg at 09/19/22 0947   doxycycline (VIBRA-TABS) tablet  100 mg, 100 mg, Oral, Q12H, End, Christopher, MD, 100 mg at 09/19/22 2151   feeding supplement (ENSURE ENLIVE / ENSURE PLUS) liquid 237 mL, 237 mL, Oral, BID BM, End, Christopher, MD, 237 mL at 09/18/22 1644   liver oil-zinc oxide (DESITIN) 40 % ointment, , Topical, TID, End, Cristal Deer, MD, Given at 09/19/22 0951   melatonin tablet 2.5 mg, 2.5 mg, Oral, QHS PRN, End, Christopher, MD, 2.5 mg at 09/16/22 2148   metoprolol succinate (TOPROL-XL) 24 hr tablet 100 mg, 100 mg, Oral, Daily, End, Christopher, MD, 100 mg at 09/19/22 0946   multivitamins with iron tablet 1 tablet, 1 tablet, Oral, Daily, End, Christopher, MD, 1 tablet at 09/19/22 0945   ondansetron (ZOFRAN) injection 4 mg, 4 mg, Intravenous, Q8H PRN, End, Christopher, MD, 4 mg at 09/17/22 1754   pantoprazole (PROTONIX) EC tablet 40 mg, 40 mg, Oral, Daily, End, Christopher, MD, 40 mg at 09/19/22 0949   piperacillin-tazobactam (ZOSYN) IVPB 3.375 g, 3.375 g, Intravenous, Q12H, End,  Cristal Deer, MD, Last Rate: 12.5 mL/hr at 09/20/22 0841, 3.375 g at 09/20/22 0841   polyethylene glycol (MIRALAX / GLYCOLAX) packet 17 g, 17 g, Oral, Daily, End, Christopher, MD, 17 g at 09/18/22 1037   rosuvastatin (CRESTOR) tablet 10 mg, 10 mg, Oral, Daily, End, Christopher, MD, 10 mg at 09/19/22 0949   senna-docusate (Senokot-S) tablet 1 tablet, 1 tablet, Oral, BID, End, Christopher, MD, 1 tablet at 09/19/22 2151   simethicone (MYLICON) chewable tablet 80 mg, 80 mg, Oral, QID PRN, End, Christopher, MD, 80 mg at 08/20/22 2121   sodium chloride (OCEAN) 0.65 % nasal spray 1 spray, 1 spray, Each Nare, PRN, End, Christopher, MD, 1 spray at 08/27/22 0319   sodium chloride flush (NS) 0.9 % injection 3 mL, 3 mL, Intravenous, Q12H, End, Christopher, MD, 3 mL at 09/19/22 2203   sodium chloride flush (NS) 0.9 % injection 3 mL, 3 mL, Intravenous, PRN, End, Cristal Deer, MD   traMADol (ULTRAM) tablet 50 mg, 50 mg, Oral, Q6H PRN, End, Christopher, MD, 50 mg at 09/18/22  1358    ALLERGIES   Patient has no known allergies.     REVIEW OF SYSTEMS    Review of Systems:  Gen:  Denies  fever, sweats, chills weigh loss  HEENT: Denies blurred vision, double vision, ear pain, eye pain, hearing loss, nose bleeds, sore throat Cardiac:  No dizziness, chest pain or heaviness, chest tightness,edema Resp:   reports dyspnea chronically  Gi: Denies swallowing difficulty, stomach pain, nausea or vomiting, diarrhea, constipation, bowel incontinence Gu:  Denies bladder incontinence, burning urine Ext:   Denies Joint pain, stiffness or swelling Skin: Denies  skin rash, easy bruising or bleeding or hives Endoc:  Denies polyuria, polydipsia , polyphagia or weight change Psych:   Denies depression, insomnia or hallucinations   Other:  All other systems negative   VS: BP (!) 144/71 (BP Location: Left Arm)   Pulse 73   Temp 98 F (36.7 C) (Oral)   Resp 18   Ht 6' (1.829 m)   Wt 80.9 kg   SpO2 93%   BMI 24.19 kg/m      PHYSICAL EXAM    GENERAL:NAD, no fevers, chills, no weakness no fatigue HEAD: Normocephalic, atraumatic.  EYES: Pupils equal, round, reactive to light. Extraocular muscles intact. No scleral icterus.  MOUTH: Moist mucosal membrane. Dentition intact. No abscess noted.  EAR, NOSE, THROAT: Clear without exudates. No external lesions.  NECK: Supple. No thyromegaly. No nodules. No JVD.  PULMONARY: decreased breath sounds with mild rhonchi worse at bases bilaterally.  CARDIOVASCULAR: S1 and S2. Regular rate and rhythm. No murmurs, rubs, or gallops. No edema. Pedal pulses 2+ bilaterally.  GASTROINTESTINAL: Soft, nontender, nondistended. No masses. Positive bowel sounds. No hepatosplenomegaly.  MUSCULOSKELETAL: No swelling, clubbing, or edema. Range of motion full in all extremities.  NEUROLOGIC: Cranial nerves II through XII are intact. No gross focal neurological deficits. Sensation intact. Reflexes intact.  SKIN: No ulceration, lesions,  rashes, or cyanosis. Skin warm and dry. Turgor intact.  PSYCHIATRIC: Mood, affect within normal limits. The patient is awake, alert and oriented x 3. Insight, judgment intact.       IMAGING     ASSESSMENT/PLAN    Acute on chronic hypoxemic respiratory failure - compared to previous CT chest 09/05/22 infiltrates have worsened with interstitial infiltrates bilaterally and bronchiectasis in the background.  -CRP and ESR severely elevated indicative of underlying active systemic process.  Viral workup thus far negative, starting steroids via IV today. -s/p RHC -  appreciate cardiology - right heart metrics are within reference range without PH -COVID and RVP reswabbing- negative  -ON  zosyn and bactrim  -there is no leukocytosis and patient does not appear to be septic clinically, consider consultation with ID to stop antimicrobials to help preserve renal function/Cdiff devt      Thank you for allowing me to participate in the care of this patient.   Patient/Family are satisfied with care plan and all questions have been answered.    Provider disclosure: Patient with at least one acute or chronic illness or injury that poses a threat to life or bodily function and is being managed actively during this encounter.  All of the below services have been performed independently by signing provider:  review of prior documentation from internal and or external health records.  Review of previous and current lab results.  Interview and comprehensive assessment during patient visit today. Review of current and previous chest radiographs/CT scans. Discussion of management and test interpretation with health care team and patient/family.   This document was prepared using Dragon voice recognition software and may include unintentional dictation errors.     Vida Rigger, M.D.  Division of Pulmonary & Critical Care Medicine

## 2022-09-20 NOTE — Progress Notes (Signed)
Occupational Therapy Treatment Patient Details Name: Brett Riley MRN: [id number] DOB: 1935-04-19 Today's Date: 09/20/2022   History of present illness Brett Riley is a 87 y/o M comes to [facility] 6/16 with respiratory failure; PMH includes MI, CHF, a-fib, HTN, anemia. 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   OT comments  Pt seen for OT tx. Pt required MAX A +2 for bed mobility and substantial assist to maintain static sitting balance. Attempted dynamic reaching which proved very difficult. Pt endorsed feeling SOB quickly and unable to tolerate sitting more than ~33min before requiring return to supine. SpO2 83% on 10L improving to 90% once returned to bed. Pt continues to benefit from skilled OT services.    Recommendations for follow up therapy are one component of a multi-disciplinary discharge planning process, led by the attending physician.  Recommendations may be updated based on patient status, additional functional criteria and insurance authorization.    Assistance Recommended at Discharge Frequent or constant Supervision/Assistance  Patient can return home with the following  Two people to help with walking and/or transfers;A lot of help with bathing/dressing/bathroom;Assistance with cooking/housework;Direct supervision/assist for medications management;Direct supervision/assist for financial management;Assist for transportation;Help with stairs or ramp for entrance   Equipment Recommendations  Other (comment) (defer to next venue)    Recommendations for Other Services      Precautions / Restrictions Precautions Precautions: Fall Restrictions Weight Bearing Restrictions: No Other Position/Activity Restrictions: monitor SpO2       Mobility Bed Mobility Overal bed mobility: Needs Assistance Bed Mobility:  Supine to Sit, Sit to Supine, Rolling Rolling: Mod assist, Min assist   Supine to sit: Max assist, +2 for physical assistance Sit to supine: Max assist, +2 for physical assistance   General bed mobility comments: Encouragement, heavy use of bedrails, pt fearful of falling    Transfers                         Balance                                           ADL either performed or assessed with clinical judgement   ADL Overall ADL's : Needs assistance/impaired     Grooming: Bed level;Wash/dry face;Set up Grooming Details (indicate cue type and reason): long sitting in bed, set up for washing face                                    Extremity/Trunk Assessment              Vision       Perception     Praxis      Cognition Arousal/Alertness: Awake/alert Behavior During Therapy: WFL for tasks assessed/performed Overall Cognitive Status: Within Functional Limits for tasks assessed                                          Exercises Other Exercises Other Exercises: heavy instruction and cues for PLB throughout session    Shoulder Instructions       General  Comments On 9-10L O2, spO2 decreased to 83% while sitting EOB. Slow increase back up to 90% upon return to supine    Pertinent Vitals/ Pain       Pain Assessment Pain Assessment: 0-10 Pain Score: 4  Pain Location: low back Pain Descriptors / Indicators: Guarding, Discomfort Pain Intervention(s): Limited activity within patient's tolerance, Monitored during session, Premedicated before session, Repositioned  Home Living                                          Prior Functioning/Environment              Frequency  Min 1X/week        Progress Toward Goals  OT Goals(current goals can now be found in the care plan section)  Progress towards OT goals: Progressing toward goals  Acute Rehab OT Goals Patient Stated  Goal: get better OT Goal Formulation: With patient Time For Goal Achievement: 10/08/22 Potential to Achieve Goals: Fair  Plan Frequency remains appropriate;Discharge plan needs to be updated    Co-evaluation                 AM-PAC OT "6 Clicks" Daily Activity     Outcome Measure   Help from another person eating meals?: A Little Help from another person taking care of personal grooming?: A Little Help from another person toileting, which includes using toliet, bedpan, or urinal?: Total Help from another person bathing (including washing, rinsing, drying)?: Total Help from another person to put on and taking off regular upper body clothing?: A Lot Help from another person to put on and taking off regular lower body clothing?: Total 6 Click Score: 11    End of Session Equipment Utilized During Treatment: Oxygen (10L)  OT Visit Diagnosis: Unsteadiness on feet (R26.81);Muscle weakness (generalized) (M62.81)   Activity Tolerance Patient limited by fatigue   Patient Left with call bell/phone within reach;in bed;with bed alarm set   Nurse Communication Mobility status        Time: 1610-9604 OT Time Calculation (min): 8 min  Charges: OT General Charges $OT Visit: 1 Visit OT Treatments $Therapeutic Activity: 8-22 mins  Arman Filter., MPH, MS, OTR/L ascom [phone] 09/20/22, 3:53 PM

## 2022-09-20 NOTE — Progress Notes (Signed)
[facility]  ROUNDING NOTE   Subjective:   Patient seen resting in bed Alert and oriented Wife at bedside Patient states he feels better today  Creatinine 4.25 UOP in past 24 hours   Objective:  Vital signs in last 24 hours:  Temp:  [97.9 F (36.6 C)-98.5 F (36.9 C)] 98.3 F (36.8 C) (07/19 1220) Pulse Rate:  [73-79] 76 (07/19 1220) Resp:  [14-24] 20 (07/19 1220) BP: (115-164)/(71-86) 143/75 (07/19 1220) SpO2:  [90 %-99 %] 98 % (07/19 1220)  Weight change: 0 kg Filed Weights   09/18/22 0419 09/19/22 0016 09/19/22 1339  Weight: 88.3 kg 80.9 kg 80.9 kg    Intake/Output: I/O last 3 completed shifts: In: 1127.9 [P.O.:480; I.V.:597.9; IV Piggyback:50] Out: 4400 [Urine:4400]   Intake/Output this shift:  Total I/O In: -  Out: 925 [Urine:925]  Physical Exam: General: NAD, laying in bed  Head: Normocephalic, atraumatic. Moist oral mucosal membranes  Eyes: Anicteric  Lungs:  Bilateral crackles, HFNC  Heart: Regular rate and rhythm  Abdomen:  +distended, nontender  Extremities:  3+ peripheral edema.  TED hose  Neurologic: Alert, moving all four extremities  Skin: No lesions  Access: none    Basic Metabolic Panel: Recent Labs  Lab 09/16/22 0814 09/17/22 0924 09/18/22 1436 09/19/22 0630 09/20/22 0914  NA 142 140 140 141 143  K 3.5 3.6 3.5 3.5 3.1*  CL 108 104 103 101 99  CO2 24 22 26 26 28   GLUCOSE 97 104* 110* 106* 124*  BUN 84* 84* 95* 96* 99*  CREATININE 3.71* 3.66* 3.89* 3.93* 4.25*  CALCIUM 8.8* 8.6* 8.5* 8.8* 8.9    Liver Function Tests: Recent Labs  Lab 09/18/22 1436  ALBUMIN 2.5*    No results for input(s): "LIPASE", "AMYLASE" in the last 168 hours. No results for input(s): "AMMONIA" in the last 168 hours.  CBC: Recent Labs  Lab 09/16/22 0814 09/17/22 0924 09/18/22 1436 09/19/22 0630 09/20/22 0914  WBC 7.9 5.9 6.4 6.4 5.5  NEUTROABS 4.7 3.9  --   --   --   HGB 8.3* 7.8* 8.0* 8.3* 8.5*  HCT 25.7* 25.6* 25.2*  25.9* 27.4*  MCV 91.5 97.0 92.6 90.9 92.3  PLT 141* 130* 152 165 174    Cardiac Enzymes: No results for input(s): "CKTOTAL", "CKMB", "CKMBINDEX", "TROPONINI" in the last 168 hours.   BNP: Invalid input(s): "POCBNP"  CBG: No results for input(s): "GLUCAP" in the last 168 hours.   Microbiology: Results for orders placed or performed during the hospital encounter of 08/18/22  Blood culture (routine x 2)     Status: None   Collection Time: 08/18/22 10:45 AM   Specimen: BLOOD LEFT ARM  Result Value Ref Range Status   Specimen Description BLOOD LEFT ARM  Final   Special Requests   Final    BOTTLES DRAWN AEROBIC AND ANAEROBIC Blood Culture adequate volume   Culture   Final    NO GROWTH 5 DAYS Performed at [facility], [address] [place], [place] [address]    Report Status 08/23/2022 FINAL  Final  Resp Panel by RT-PCR (Flu A&B, Covid) Anterior Nasal Swab     Status: None   Collection Time: 08/18/22 10:46 AM   Specimen: Anterior Nasal Swab  Result Value Ref Range Status   SARS Coronavirus 2 by RT PCR NEGATIVE NEGATIVE Final    Comment: (NOTE) SARS-CoV-2 target nucleic acids are NOT DETECTED.  The SARS-CoV-2 RNA is generally detectable in upper respiratory specimens during the  acute phase of infection. The lowest concentration of SARS-CoV-2 viral copies this assay can detect is 138 copies/mL. A negative result does not preclude SARS-Cov-2 infection and should not be used as the sole basis for treatment or other patient management decisions. A negative result may occur with  improper specimen collection/handling, submission of specimen other than nasopharyngeal swab, presence of viral mutation(s) within the areas targeted by this assay, and inadequate number of viral copies(<138 copies/mL). A negative result must be combined with clinical observations, patient history, and epidemiological information. The expected result is Negative.  Fact Sheet for Patients:   BloggerCourse.com  Fact Sheet for Healthcare Providers:  SeriousBroker.it  This test is no t yet approved or cleared by the [place] FDA and  has been authorized for detection and/or diagnosis of SARS-CoV-2 by FDA under an Emergency Use Authorization (EUA). This EUA will remain  in effect (meaning this test can be used) for the duration of the COVID-19 declaration under Section 564(b)(1) of the Act, 21 U.S.C.section 360bbb-3(b)(1), unless the authorization is terminated  or revoked sooner.       Influenza A by PCR NEGATIVE NEGATIVE Final   Influenza B by PCR NEGATIVE NEGATIVE Final    Comment: (NOTE) The Xpert Xpress SARS-CoV-2/FLU/RSV plus assay is intended as an aid in the diagnosis of influenza from Nasopharyngeal swab specimens and should not be used as a sole basis for treatment. Nasal washings and aspirates are unacceptable for Xpert Xpress SARS-CoV-2/FLU/RSV testing.  Fact Sheet for Patients: BloggerCourse.com  Fact Sheet for Healthcare Providers: SeriousBroker.it  This test is not yet approved or cleared by the [place] FDA and has been authorized for detection and/or diagnosis of SARS-CoV-2 by FDA under an Emergency Use Authorization (EUA). This EUA will remain in effect (meaning this test can be used) for the duration of the COVID-19 declaration under Section 564(b)(1) of the Act, 21 U.S.C. section 360bbb-3(b)(1), unless the authorization is terminated or revoked.  Performed at [facility], [address] [place], [place] [address]   Culture, blood (Routine X 2) w Reflex to ID Panel     Status: None   Collection Time: 08/18/22  9:19 PM   Specimen: BLOOD LEFT ARM  Result Value Ref Range Status   Specimen Description BLOOD LEFT ARM  Final   Special Requests   Final    BOTTLES DRAWN AEROBIC AND ANAEROBIC Blood Culture adequate volume    Culture   Final    NO GROWTH 5 DAYS Performed at [facility], [address] [place], [place] [address]    Report Status 08/23/2022 FINAL  Final  Respiratory (~20 pathogens) panel by PCR     Status: None   Collection Time: 08/21/22  1:50 PM   Specimen: Nasopharyngeal Swab; Respiratory  Result Value Ref Range Status   Adenovirus NOT DETECTED NOT DETECTED Final   Coronavirus 229E NOT DETECTED NOT DETECTED Final    Comment: (NOTE) The Coronavirus on the Respiratory Panel, DOES NOT test for the novel  Coronavirus (2019 nCoV)    Coronavirus HKU1 NOT DETECTED NOT DETECTED Final   Coronavirus NL63 NOT DETECTED NOT DETECTED Final   Coronavirus OC43 NOT DETECTED NOT DETECTED Final   Metapneumovirus NOT DETECTED NOT DETECTED Final   Rhinovirus / Enterovirus NOT DETECTED NOT DETECTED Final   Influenza A NOT DETECTED NOT DETECTED Final   Influenza B NOT DETECTED NOT DETECTED Final   Parainfluenza Virus 1 NOT DETECTED NOT DETECTED Final   Parainfluenza Virus 2 NOT DETECTED  NOT DETECTED Final   Parainfluenza Virus 3 NOT DETECTED NOT DETECTED Final   Parainfluenza Virus 4 NOT DETECTED NOT DETECTED Final   Respiratory Syncytial Virus NOT DETECTED NOT DETECTED Final   Bordetella pertussis NOT DETECTED NOT DETECTED Final   Bordetella Parapertussis NOT DETECTED NOT DETECTED Final   Chlamydophila pneumoniae NOT DETECTED NOT DETECTED Final   Mycoplasma pneumoniae NOT DETECTED NOT DETECTED Final    Comment: Performed at [facility], [address] [place], [place] [address]  Culture, blood (Routine X 2) w Reflex to ID Panel     Status: None   Collection Time: 08/21/22  8:58 PM   Specimen: BLOOD  Result Value Ref Range Status   Specimen Description BLOOD LEFT WRIST  Final   Special Requests   Final    BOTTLES DRAWN AEROBIC AND ANAEROBIC Blood Culture adequate volume   Culture   Final    NO GROWTH 5 DAYS Performed at [facility], [address] [place], [place]  [address]    Report Status 08/26/2022 FINAL  Final  Culture, blood (Routine X 2) w Reflex to ID Panel     Status: None   Collection Time: 08/21/22 11:01 PM   Specimen: BLOOD  Result Value Ref Range Status   Specimen Description BLOOD BLOOD LEFT ARM  Final   Special Requests   Final    BOTTLES DRAWN AEROBIC AND ANAEROBIC Blood Culture adequate volume   Culture   Final    NO GROWTH 5 DAYS Performed at [facility], [address] [place], [place] [address]    Report Status 08/26/2022 FINAL  Final  Expectorated Sputum Assessment w Gram Stain, Rflx to Resp Cult     Status: None   Collection Time: 08/22/22 11:22 AM   Specimen: Sputum  Result Value Ref Range Status   Specimen Description SPUTUM  Final   Special Requests EXPSU  Final   Sputum evaluation   Final    THIS SPECIMEN IS ACCEPTABLE FOR SPUTUM CULTURE Performed at [facility], [address], [place], [place] [address]    Report Status 08/22/2022 FINAL  Final  Culture, Respiratory w Gram Stain     Status: None   Collection Time: 08/22/22 11:22 AM   Specimen: SPU  Result Value Ref Range Status   Specimen Description   Final    SPUTUM Performed at [facility], [address], [place], [place] [address]    Special Requests   Final    EXPSU Reflexed from [id number] Performed at [facility], [address] [place], [place] [address]    Gram Stain   Final    ABUNDANT SQUAMOUS EPITHELIAL CELLS PRESENT FEW WBC PRESENT, PREDOMINANTLY PMN ABUNDANT GRAM VARIABLE ROD ABUNDANT GRAM POSITIVE COCCI IN PAIRS    Culture   Final    Normal respiratory flora-no Staph aureus or Pseudomonas seen Performed at [facility], [address], [place], [place] [address]    Report Status 08/25/2022 FINAL  Final  SARS Coronavirus 2 by RT PCR (hospital order, performed in [facility] hospital lab) *cepheid single result test* Anterior Nasal Swab     Status: None   Collection Time: 09/19/22  1:31 PM    Specimen: Anterior Nasal Swab  Result Value Ref Range Status   SARS Coronavirus 2 by RT PCR NEGATIVE NEGATIVE Final    Comment: (NOTE) SARS-CoV-2 target nucleic acids are NOT DETECTED.  The SARS-CoV-2 RNA is generally detectable in upper and lower  respiratory specimens during the acute phase of infection. The lowest concentration of SARS-CoV-2 viral copies this assay can detect is 250 copies / mL. A negative result does not preclude SARS-CoV-2 infection and should not be used as the sole basis for treatment or other patient management decisions.  A negative result may occur with improper specimen collection / handling, submission of specimen other than nasopharyngeal swab, presence of viral mutation(s) within the areas targeted by this assay, and inadequate number of viral copies (<250 copies / mL). A negative result must be combined with clinical observations, patient history, and epidemiological information.  Fact Sheet for Patients:   RoadLapTop.co.za  Fact Sheet for Healthcare Providers: http://kim-miller.com/  This test is not yet approved or  cleared by the [place] FDA and has been authorized for detection and/or diagnosis of SARS-CoV-2 by FDA under an Emergency Use Authorization (EUA).  This EUA will remain in effect (meaning this test can be used) for the duration of the COVID-19 declaration under Section 564(b)(1) of the Act, 21 U.S.C. section 360bbb-3(b)(1), unless the authorization is terminated or revoked sooner.  Performed at [facility], [address] [place], [place] [address]   Respiratory (~20 pathogens) panel by PCR     Status: None   Collection Time: 09/19/22  1:31 PM   Specimen: Nasopharyngeal Swab; Respiratory  Result Value Ref Range Status   Adenovirus NOT DETECTED NOT DETECTED Final   Coronavirus 229E NOT DETECTED NOT DETECTED Final    Comment: (NOTE) The Coronavirus on the Respiratory  Panel, DOES NOT test for the novel  Coronavirus (2019 nCoV)    Coronavirus HKU1 NOT DETECTED NOT DETECTED Final   Coronavirus NL63 NOT DETECTED NOT DETECTED Final   Coronavirus OC43 NOT DETECTED NOT DETECTED Final   Metapneumovirus NOT DETECTED NOT DETECTED Final   Rhinovirus / Enterovirus NOT DETECTED NOT DETECTED Final   Influenza A NOT DETECTED NOT DETECTED Final   Influenza B NOT DETECTED NOT DETECTED Final   Parainfluenza Virus 1 NOT DETECTED NOT DETECTED Final   Parainfluenza Virus 2 NOT DETECTED NOT DETECTED Final   Parainfluenza Virus 3 NOT DETECTED NOT DETECTED Final   Parainfluenza Virus 4 NOT DETECTED NOT DETECTED Final   Respiratory Syncytial Virus NOT DETECTED NOT DETECTED Final   Bordetella pertussis NOT DETECTED NOT DETECTED Final   Bordetella Parapertussis NOT DETECTED NOT DETECTED Final   Chlamydophila pneumoniae NOT DETECTED NOT DETECTED Final   Mycoplasma pneumoniae NOT DETECTED NOT DETECTED Final    Comment: Performed at [facility], [address], [place], [place] [address]    Coagulation Studies: No results for input(s): "LABPROT", "INR" in the last 72 hours.  Urinalysis: No results for input(s): "COLORURINE", "LABSPEC", "PHURINE", "GLUCOSEU", "HGBUR", "BILIRUBINUR", "KETONESUR", "PROTEINUR", "UROBILINOGEN", "NITRITE", "LEUKOCYTESUR" in the last 72 hours.  Invalid input(s): "APPERANCEUR"     Imaging: ECHOCARDIOGRAM LIMITED  Result Date: 09/20/2022    ECHOCARDIOGRAM LIMITED REPORT   Patient Name:   Brett Riley Date of Exam: 09/18/2022 Medical Rec #:  [id number]      Height:       72.0 in Accession #:    [id number]     Weight:       194.7 lb Date of Birth:  10/30/1935     BSA:          2.106 m Patient Age:    86 years       BP:           122/70 mmHg Patient Gender:  M              HR:           81 bpm. Exam Location:  [facility] Procedure: 2D Echo, Limited Echo, Limited Color Doppler and Cardiac Doppler Indications:     R06.00 Dyspnea  History:          Patient has prior history of Echocardiogram examinations, most                  recent 08/19/2022. CAD; Risk Factors:Hypertension and                  Dyslipidemia.  Sonographer:     Daphine Deutscher RDCS Referring Phys:  [id number] Raymon Mutton DUNN Diagnosing Phys: Lorine Bears MD IMPRESSIONS  1. Left ventricular ejection fraction, by estimation, is 50 to 55%. The left ventricle has low normal function. The left ventricle has no regional wall motion abnormalities. Left ventricular diastolic parameters were normal.  2. Right ventricular systolic function is normal. The right ventricular size is normal. There is normal pulmonary artery systolic pressure.  3. Left atrial size was mildly dilated.  4. The mitral valve is normal in structure. Mild to moderate mitral valve regurgitation. No evidence of mitral stenosis.  5. Tricuspid valve regurgitation is mild to moderate.  6. The aortic valve is normal in structure. Aortic valve regurgitation is not visualized. Aortic valve sclerosis/calcification is present, without any evidence of aortic stenosis. FINDINGS  Left Ventricle: Left ventricular ejection fraction, by estimation, is 50 to 55%. The left ventricle has low normal function. The left ventricle has no regional wall motion abnormalities. The left ventricular internal cavity size was normal in size. There is borderline left ventricular hypertrophy. Left ventricular diastolic parameters were normal. Right Ventricle: The right ventricular size is normal. No increase in right ventricular wall thickness. Right ventricular systolic function is normal. There is normal pulmonary artery systolic pressure. The tricuspid regurgitant velocity is 2.25 m/s, and  with an assumed right atrial pressure of 5 mmHg, the estimated right ventricular systolic pressure is 25.2 mmHg. Left Atrium: Left atrial size was mildly dilated. Right Atrium: Right atrial size was normal in size. Pericardium: There is no evidence of pericardial effusion.  Mitral Valve: The mitral valve is normal in structure. There is mild thickening of the mitral valve leaflet(s). Mild to moderate mitral valve regurgitation. No evidence of mitral valve stenosis. Tricuspid Valve: The tricuspid valve is normal in structure. Tricuspid valve regurgitation is mild to moderate. No evidence of tricuspid stenosis. Aortic Valve: The aortic valve is normal in structure. Aortic valve regurgitation is not visualized. Aortic valve sclerosis/calcification is present, without any evidence of aortic stenosis. Pulmonic Valve: The pulmonic valve was normal in structure. Pulmonic valve regurgitation is not visualized. No evidence of pulmonic stenosis. Aorta: The aortic root is normal in size and structure. Venous: The inferior vena cava was not well visualized. IAS/Shunts: No atrial level shunt detected by color flow Doppler. LEFT VENTRICLE PLAX 2D LVIDd:         5.10 cm Diastology LVIDs:         3.60 cm LV e' medial:    8.22 cm/s LV PW:         1.00 cm LV E/e' medial:  10.3 LV IVS:        1.00 cm LV e' lateral:   6.91 cm/s  LV E/e' lateral: 12.2  RIGHT VENTRICLE RV S prime:     13.20 cm/s TAPSE (M-mode): 2.2 cm LEFT ATRIUM         Index LA diam:    4.40 cm 2.09 cm/m   AORTA Ao Root diam: 3.70 cm MITRAL VALVE               TRICUSPID VALVE MV Area (PHT): 2.48 cm    TR Peak grad:   20.2 mmHg MV Decel Time: 306 msec    TR Vmax:        225.00 cm/s MV E velocity: 84.40 cm/s MV A velocity: 99.10 cm/s MV E/A ratio:  0.85 Lorine Bears MD Electronically signed by Lorine Bears MD Signature Date/Time: 09/20/2022/1:11:47 PM    Final    CARDIAC CATHETERIZATION  Result Date: 09/19/2022 Conclusions: Normal left heart, right heart, and pulmonary artery pressures. Normal cardiac output/index. Recommendations: Findings suggest that worsening hypoxia and lung opacities are not due to cardiogenic edema.  Continue to hold diuretics. Further workup of non-cardiac causes of respiratory  decompensation per internal medicine and pulmonology. Yvonne Kendall, MD [facility]    Medications:    sodium chloride 10 mL/hr at 09/20/22 0700   sodium chloride     piperacillin-tazobactam (ZOSYN)  IV 3.375 g (09/20/22 0841)    acidophilus  1 capsule Oral Daily   apixaban  2.5 mg Oral BID   cyanocobalamin  1,000 mcg Oral Daily   diltiazem  360 mg Oral Daily   doxycycline  100 mg Oral Q12H   feeding supplement  237 mL Oral BID BM   liver oil-zinc oxide   Topical TID   methylPREDNISolone (SOLU-MEDROL) injection  40 mg Intravenous Q24H   metoprolol succinate  100 mg Oral Daily   multivitamins with iron  1 tablet Oral Daily   pantoprazole  40 mg Oral Daily   polyethylene glycol  17 g Oral Daily   rosuvastatin  10 mg Oral Daily   senna-docusate  1 tablet Oral BID   sodium chloride flush  3 mL Intravenous Q12H   sodium chloride, sodium chloride, acetaminophen, albuterol, bisacodyl, dextromethorphan-guaiFENesin, melatonin, ondansetron (ZOFRAN) IV, simethicone, sodium chloride, sodium chloride flush, traMADol  Assessment/ Plan:  [name] is a 87 y.o.  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  Acute kidney injury with proteinuria and hematuria on baseline creatinine of 1.05 GFR > 60 on 07/02/22.  Secondary to ATN versus AIN from cefazolin. IV contrast exposure on 07/24/22. -Creatinine 4.25 today,  - Adequate urine output  2. Acute respiratory failure requiring HFNC. IV Furosemide drip. Ileus seen on KUB. CT  chest suspicious for pneumonia. Cardiology performed RHC and recommend holding diuresis.     3.  Acute on chronic diastolic heart failure. With LE edema  Ejection fraction 55 to 60% with grade 1 diastolic dysfunction.  4.  Anemia with renal failure:     Lab Results  Component Value Date   HGB 8.5 (L) 09/20/2022  Hgb below optimal target.     LOS: 33   7/19/20242:30 PM

## 2022-09-20 NOTE — Progress Notes (Signed)
PROGRESS NOTE    Brett Riley  [name] DOB: [birth year] DOA: 08/18/2022 PCP: Dale [place]kins[place], MD    Brief Narrative:  87 y.o. male with medical history significant of chronic dCHF, HTN, HLD, CAD, CKD-3a, anemia, chronic A fib on Eliquis, who presents 08/18/2022 to ED from rehab, chief complaint SOB.  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 Net IO Since Admission: -5,549.14 mL [08/20/22 1608]. Heparin stopped d/t rectal bleeding overnight.  06/19: euvolemic per cardio, d/c lasix, O2 requirement still high, CT chest reviewed, ?edema/atypical infection, given clinically worsening respiratory status have asked pulmonary and ID to consult. Have d/c amiodarone and ordered steroids in case amio toxicity.  06/20: ID and pulmonary assisting with the management.   6/22-6/25: patient feels clinically improved. Stable with oxygen via [place] and weaned down to 2-3L. Patient requiring judicial adjustments to balance between hypervolemia and renal injury with diuresis. Unable to pull fluid from lungs with thoracentesis so medical management is best option. His IV Abx to treat his vertebral osteomyelitis from previous admission continue unchanged and managed by ID.    6/28: Bed offered at [facility].  Authorization pending 7/1: No status changes.  Per patient's daughter he has been seeming more depressed.  I have offered outpatient referral to psychiatry.   7/2: Insurance authorization for LTAC continues to be pending   7/3 : Peer-to-peer review done.  Not a candidate for LTAC.   Approved for skilled nursing facility patient   7/9 : Medically stable and ready for discharge to skilled nursing facility 7/10: No acute events.  Pending insurance authorization  7/15: Acute hypoxia noted this morning.  Placed on high flow nasal cannula.  Pulmonary edema noted on check chest x-ray.  Lasix restarted.  7/16: Further deterioration of respiratory status.  Required initiation of BiPAP initiation of Lasix gtt.   Assessment & Plan:   Principal Problem:   Acute respiratory failure with hypoxia (HCC) Active Problems:   Acute on chronic heart failure with preserved ejection fraction (HFpEF) (HCC)   MSSA bacteremia   Vertebral osteomyelitis (HCC)   CAD (coronary artery disease)   Myocardial injury   Hypertension   Hyperlipidemia   Chronic kidney disease, stage 3a (HCC)   Normocytic anemia   Atrial fibrillation, chronic (HCC)   PVD (peripheral vascular disease) (HCC)   Paroxysmal atrial fibrillation (HCC)   HCAP (healthcare-associated pneumonia)   Sepsis (HCC)   Pleural effusion   Amiodarone pulmonary toxicity   Acute on chronic HFrEF (heart failure with reduced ejection fraction) (HCC)   Pleural effusion on right   Status post thoracentesis   Heart failure with preserved ejection fraction (HCC)   Stage 3 chronic kidney disease (HCC)  Acute on chronic respiratory failure with hypoxia  Was previously on room air Saturation acutely worsened on 7/15 Thought to be chf and treated with lasix gtt. RHC on 7/18 showed normal filling pressures, this does not appear to be CHF. Inflammatory markers quite elevated, viral panel negative. ABX have been broadened to zosyn Plan: Continue lasix gtt, metolazone per cardiology Continue hfnc, seems stable on this Abx broadened to zosyn  on 7/17, continuing those now but no fever or white count to strongly suggest infection Inflammatory process likely driving things, pulmonology re-consulted on 7/18 and have started IV steroids  today Weaning O2, down to 9 liters today (was on 10 last several days)  Normocytic anemia Thrombocytopenia Hgb stable around 8 and platelets now stable around 150 - monitor for now  AKI on Chronic kidney disease, stage 3a (HCC): Uop adequate, cr today 4.25 no chang Plan: Nephrology following now hopeful for renal recovery, no indication for dialysis at the present  diastolic CHF-  Diuresis on hold as above   Pleural effusion IR unable to perform thoracentesis 6/20, 6/21. Now resolved  Atrial fibrillation, chronic (HCC) Heart rate controlled.  On diltiazem and metoprolol per cardiology.  On Eliquis.  Monitor for bleeding.   Recent hx of MSSA bacteremia vertebral osteomyelitis (HCC): Received Ancef until 7/11.  This was stopped and doxycycline 100 mg p.o. twice daily x 6 weeks started per ID recommendations.  PICC line removed 7/12.  Recent penetrating atherosclerotic ulcer Repaired by vascular surgery 07/2022   CAD  Myocardial injury  HLD:  Evidence of ACS Continue Crestor, beta-blocker, Eliquis No plans for inpatient ischemic evaluation    GERD PPI  Ileus resolved  DVT prophylaxis: Eliquis Code Status: DNR Family Communication: daughter at bedside 7/19 Disposition Plan: Status is: Inpatient Remains inpatient appropriate because: severe illness   Level of care: Progressive  Consultants:  Nephrology, pulmonology, cardiology  Procedures:  Right heart cath 7/18  Antimicrobials: Doxycycline zosyn   Subjective: Breathing stable. Tolerating diet.   Objective: Vitals:   09/20/22 0320 09/20/22 0800 09/20/22 1220 09/20/22 1350  BP: 115/84 (!) 144/71 (!) 143/75   Pulse: 76 73 76   Resp: 18 18 20    Temp: 97.9 F (36.6 C) 98 F (36.7 C) 98.3 F (36.8 C)   TempSrc: Oral Oral Oral   SpO2: 94% 93% 98% 96%  Weight:      Height:        Intake/Output Summary (Last 24 hours) at 09/20/2022 1509 Last data filed at 09/20/2022 1300 Gross per 24 hour  Intake  286.57 ml  Output 2225 ml  Net -1938.43 ml   Filed Weights   09/18/22 0419 09/19/22 0016 09/19/22 1339  Weight: 88.3 kg 80.9 kg 80.9 kg    Examination:  General exam: Appears fatigued Respiratory system: Scattered crackles bilaterally.  Normal work of breathing.  9 L Cardiovascular system: S1-S2, RRR, no murmurs, 2+ pedal edema Gastrointestinal system: Soft, NT/ND, normal bowel sounds Central nervous system: Alert and oriented. No focal neurological deficits. Extremities: Decreased power bilateral lower extremities Skin: No rashes, lesions or ulcers Psychiatry: Judgement and insight appear normal. Mood & affect appropriate.     Data Reviewed: I have personally reviewed following labs and imaging studies  CBC: Recent Labs  Lab 09/16/22 0814 09/17/22 0924 09/18/22 1436 09/19/22 0630 09/20/22 0914  WBC 7.9 5.9 6.4 6.4 5.5  NEUTROABS 4.7 3.9  --   --   --   HGB 8.3* 7.8* 8.0* 8.3* 8.5*  HCT 25.7* 25.6* 25.2* 25.9* 27.4*  MCV 91.5 97.0 92.6 90.9 92.3  PLT 141* 130* 152 165 174   Basic Metabolic Panel: Recent Labs  Lab 09/16/22 0814 09/17/22 0924 09/18/22 1436 09/19/22 0630 09/20/22 0914  NA 142 140 140 141 143  K 3.5 3.6 3.5 3.5 3.1*  CL 108 104 103 101 99  CO2 24 22 26 26 28   GLUCOSE 97 104* 110* 106* 124*  BUN  84* 84* 95* 96* 99*  CREATININE 3.71* 3.66* 3.89* 3.93* 4.25*  CALCIUM 8.8* 8.6* 8.5* 8.8* 8.9  MG  --   --   --   --  1.9   GFR: Estimated Creatinine Clearance: 14.2 mL/min (A) (by C-G formula based on SCr of 4.25 mg/dL (H)). Liver Function Tests: Recent Labs  Lab 09/18/22 1436  ALBUMIN 2.5*   No results for input(s): "LIPASE", "AMYLASE" in the last 168 hours. No results for input(s): "AMMONIA" in the last 168 hours. Coagulation Profile: No results for input(s): "INR", "PROTIME" in the last 168 hours. Cardiac Enzymes: No results for input(s): "CKTOTAL", "CKMB", "CKMBINDEX", "TROPONINI" in the last 168 hours. BNP (last 3 results) No results  for input(s): "PROBNP" in the last 8760 hours. HbA1C: No results for input(s): "HGBA1C" in the last 72 hours. CBG: No results for input(s): "GLUCAP" in the last 168 hours. Lipid Profile: No results for input(s): "CHOL", "HDL", "LDLCALC", "TRIG", "CHOLHDL", "LDLDIRECT" in the last 72 hours. Thyroid Function Tests: No results for input(s): "TSH", "T4TOTAL", "FREET4", "T3FREE", "THYROIDAB" in the last 72 hours. Anemia Panel: No results for input(s): "VITAMINB12", "FOLATE", "FERRITIN", "TIBC", "IRON", "RETICCTPCT" in the last 72 hours. Sepsis Labs: No results for input(s): "PROCALCITON", "LATICACIDVEN" in the last 168 hours.  Recent Results (from the past 240 hour(s))  SARS Coronavirus 2 by RT PCR (hospital order, performed in [facility] hospital lab) *cepheid single result test* Anterior Nasal Swab     Status: None   Collection Time: 09/19/22  1:31 PM   Specimen: Anterior Nasal Swab  Result Value Ref Range Status   SARS Coronavirus 2 by RT PCR NEGATIVE NEGATIVE Final    Comment: (NOTE) SARS-CoV-2 target nucleic acids are NOT DETECTED.  The SARS-CoV-2 RNA is generally detectable in upper and lower respiratory specimens during the acute phase of infection. The lowest concentration of SARS-CoV-2 viral copies this assay can detect is 250 copies / mL. A negative result does not preclude SARS-CoV-2 infection and should not be used as the sole basis for treatment or other patient management decisions.  A negative result may occur with improper specimen collection / handling, submission of specimen other than nasopharyngeal swab, presence of viral mutation(s) within the areas targeted by this assay, and inadequate number of viral copies (<250 copies / mL). A negative result must be combined with clinical observations, patient history, and epidemiological information.  Fact Sheet for Patients:   RoadLapTop.co.za  Fact Sheet for Healthcare  Providers: http://kim-miller.com/  This test is not yet approved or  cleared by the [place] FDA and has been authorized for detection and/or diagnosis of SARS-CoV-2 by FDA under an Emergency Use Authorization (EUA).  This EUA will remain in effect (meaning this test can be used) for the duration of the COVID-19 declaration under Section 564(b)(1) of the Act, 21 U.S.C. section 360bbb-3(b)(1), unless the authorization is terminated or revoked sooner.  Performed at [facility], [address] [place], [place] [address]   Respiratory (~20 pathogens) panel by PCR     Status: None   Collection Time: 09/19/22  1:31 PM   Specimen: Nasopharyngeal Swab; Respiratory  Result Value Ref Range Status   Adenovirus NOT DETECTED NOT DETECTED Final   Coronavirus 229E NOT DETECTED NOT DETECTED Final    Comment: (NOTE) The Coronavirus on the Respiratory Panel, DOES NOT test for the novel  Coronavirus (2019 nCoV)    Coronavirus HKU1 NOT DETECTED NOT DETECTED Final   Coronavirus NL63 NOT DETECTED NOT DETECTED Final  Coronavirus OC43 NOT DETECTED NOT DETECTED Final   Metapneumovirus NOT DETECTED NOT DETECTED Final   Rhinovirus / Enterovirus NOT DETECTED NOT DETECTED Final   Influenza A NOT DETECTED NOT DETECTED Final   Influenza B NOT DETECTED NOT DETECTED Final   Parainfluenza Virus 1 NOT DETECTED NOT DETECTED Final   Parainfluenza Virus 2 NOT DETECTED NOT DETECTED Final   Parainfluenza Virus 3 NOT DETECTED NOT DETECTED Final   Parainfluenza Virus 4 NOT DETECTED NOT DETECTED Final   Respiratory Syncytial Virus NOT DETECTED NOT DETECTED Final   Bordetella pertussis NOT DETECTED NOT DETECTED Final   Bordetella Parapertussis NOT DETECTED NOT DETECTED Final   Chlamydophila pneumoniae NOT DETECTED NOT DETECTED Final   Mycoplasma pneumoniae NOT DETECTED NOT DETECTED Final    Comment: Performed at [facility], [address] [place], [place] [address]          Radiology Studies: ECHOCARDIOGRAM LIMITED  Result Date: 09/20/2022    ECHOCARDIOGRAM LIMITED REPORT   Patient Name:   Brett Riley Date of Exam: 09/18/2022 Medical Rec #:  [id number]      Height:       72.0 in Accession #:    [id number]     Weight:       194.7 lb Date of Birth:  12-07-1935     BSA:          2.106 m Patient Age:    86 years       BP:           122/70 mmHg Patient Gender: M              HR:           81 bpm. Exam Location:  [facility] Procedure: 2D Echo, Limited Echo, Limited Color Doppler and Cardiac Doppler Indications:     R06.00 Dyspnea  History:         Patient has prior history of Echocardiogram examinations, most                  recent 08/19/2022. CAD; Risk Factors:Hypertension and                  Dyslipidemia.  Sonographer:     Daphine Deutscher RDCS Referring Phys:  [id number] Raymon Mutton DUNN Diagnosing Phys: Lorine Bears MD IMPRESSIONS  1. Left ventricular ejection fraction, by estimation, is 50 to 55%. The left ventricle has low normal function. The left ventricle has no regional wall motion abnormalities. Left ventricular diastolic parameters were normal.  2. Right ventricular systolic function is normal. The right ventricular size is normal. There is normal pulmonary artery systolic pressure.  3. Left atrial size was mildly dilated.  4. The mitral valve is normal in structure. Mild to moderate mitral valve regurgitation. No evidence of mitral stenosis.  5. Tricuspid valve regurgitation is mild to moderate.  6. The aortic valve is normal in structure. Aortic valve regurgitation is not visualized. Aortic valve sclerosis/calcification is present, without any evidence of aortic stenosis. FINDINGS  Left Ventricle: Left ventricular ejection fraction, by estimation, is 50 to 55%. The left ventricle has low normal function. The left ventricle has no regional wall motion abnormalities. The left ventricular internal cavity size was normal in size. There is borderline left ventricular  hypertrophy. Left ventricular diastolic parameters were normal. Right Ventricle: The right ventricular size is normal. No increase in right ventricular wall thickness. Right ventricular systolic function is normal. There is normal pulmonary artery systolic pressure. The tricuspid  regurgitant velocity is 2.25 m/s, and  with an assumed right atrial pressure of 5 mmHg, the estimated right ventricular systolic pressure is 25.2 mmHg. Left Atrium: Left atrial size was mildly dilated. Right Atrium: Right atrial size was normal in size. Pericardium: There is no evidence of pericardial effusion. Mitral Valve: The mitral valve is normal in structure. There is mild thickening of the mitral valve leaflet(s). Mild to moderate mitral valve regurgitation. No evidence of mitral valve stenosis. Tricuspid Valve: The tricuspid valve is normal in structure. Tricuspid valve regurgitation is mild to moderate. No evidence of tricuspid stenosis. Aortic Valve: The aortic valve is normal in structure. Aortic valve regurgitation is not visualized. Aortic valve sclerosis/calcification is present, without any evidence of aortic stenosis. Pulmonic Valve: The pulmonic valve was normal in structure. Pulmonic valve regurgitation is not visualized. No evidence of pulmonic stenosis. Aorta: The aortic root is normal in size and structure. Venous: The inferior vena cava was not well visualized. IAS/Shunts: No atrial level shunt detected by color flow Doppler. LEFT VENTRICLE PLAX 2D LVIDd:         5.10 cm Diastology LVIDs:         3.60 cm LV e' medial:    8.22 cm/s LV PW:         1.00 cm LV E/e' medial:  10.3 LV IVS:        1.00 cm LV e' lateral:   6.91 cm/s                        LV E/e' lateral: 12.2  RIGHT VENTRICLE RV S prime:     13.20 cm/s TAPSE (M-mode): 2.2 cm LEFT ATRIUM         Index LA diam:    4.40 cm 2.09 cm/m   AORTA Ao Root diam: 3.70 cm MITRAL VALVE               TRICUSPID VALVE MV Area (PHT): 2.48 cm    TR Peak grad:   20.2 mmHg MV  Decel Time: 306 msec    TR Vmax:        225.00 cm/s MV E velocity: 84.40 cm/s MV A velocity: 99.10 cm/s MV E/A ratio:  0.85 Lorine Bears MD Electronically signed by Lorine Bears MD Signature Date/Time: 09/20/2022/1:11:47 PM    Final    CARDIAC CATHETERIZATION  Result Date: 09/19/2022 Conclusions: Normal left heart, right heart, and pulmonary artery pressures. Normal cardiac output/index. Recommendations: Findings suggest that worsening hypoxia and lung opacities are not due to cardiogenic edema.  Continue to hold diuretics. Further workup of non-cardiac causes of respiratory decompensation per internal medicine and pulmonology. Yvonne Kendall, MD [facility]       Scheduled Meds:  acidophilus  1 capsule Oral Daily   apixaban  2.5 mg Oral BID   cyanocobalamin  1,000 mcg Oral Daily   diltiazem  360 mg Oral Daily   doxycycline  100 mg Oral Q12H   feeding supplement  237 mL Oral BID BM   liver oil-zinc oxide   Topical TID   methylPREDNISolone (SOLU-MEDROL) injection  40 mg Intravenous Q24H   metoprolol succinate  100 mg Oral Daily   multivitamins with iron  1 tablet Oral Daily   pantoprazole  40 mg Oral Daily   polyethylene glycol  17 g Oral Daily   potassium chloride  60 mEq Oral Once   rosuvastatin  10 mg Oral Daily   senna-docusate  1 tablet Oral BID  sodium chloride flush  3 mL Intravenous Q12H   Continuous Infusions:  sodium chloride 10 mL/hr at 09/20/22 0700   sodium chloride     piperacillin-tazobactam (ZOSYN)  IV 3.375 g (09/20/22 0841)     LOS: 33 days     Silvano Bilis, MD [facility]   If 7PM-7AM, please contact night-coverage  09/20/2022, 3:09 PM

## 2022-09-21 DIAGNOSIS — I5032 Chronic diastolic (congestive) heart failure: Secondary | ICD-10-CM | POA: Diagnosis not present

## 2022-09-21 DIAGNOSIS — I4821 Permanent atrial fibrillation: Secondary | ICD-10-CM

## 2022-09-21 DIAGNOSIS — J9601 Acute respiratory failure with hypoxia: Secondary | ICD-10-CM | POA: Diagnosis not present

## 2022-09-21 LAB — BASIC METABOLIC PANEL WITH GFR
Anion gap: 13 (ref 5–15)
BUN: 102 mg/dL — ABNORMAL HIGH (ref 8–23)
CO2: 28 mmol/L (ref 22–32)
Calcium: 8.7 mg/dL — ABNORMAL LOW (ref 8.9–10.3)
Chloride: 102 mmol/L (ref 98–111)
Creatinine, Ser: 4.38 mg/dL — ABNORMAL HIGH (ref 0.61–1.24)
GFR, Estimated: 13 mL/min — ABNORMAL LOW
Glucose, Bld: 122 mg/dL — ABNORMAL HIGH (ref 70–99)
Potassium: 4.1 mmol/L (ref 3.5–5.1)
Sodium: 143 mmol/L (ref 135–145)

## 2022-09-21 MED ORDER — PIPERACILLIN-TAZOBACTAM 3.375 G IVPB
3.3750 g | Freq: Two times a day (BID) | INTRAVENOUS | Status: AC
  Administered 2022-09-21 – 2022-09-23 (×4): 3.375 g via INTRAVENOUS
  Filled 2022-09-21 (×4): qty 50

## 2022-09-21 NOTE — Progress Notes (Signed)
PROGRESS NOTE    Brett Riley  [name] DOB: [birth year] DOA: 08/18/2022 PCP: Dale [place], MD    Brief Narrative:  87 y.o. male with medical history significant of chronic dCHF, HTN, HLD, CAD, CKD-3a, anemia, chronic A fib on Eliquis, who presents 08/18/2022 to ED from rehab, chief complaint SOB.  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 Net IO Since Admission: -5,549.14 mL [08/20/22 1608]. Heparin stopped d/t rectal bleeding overnight.  06/19: euvolemic per cardio, d/c lasix, O2 requirement still high, CT chest reviewed, ?edema/atypical infection, given clinically worsening respiratory status have asked pulmonary and ID to consult. Have d/c amiodarone and ordered steroids in case amio toxicity.  06/20: ID and pulmonary assisting with the management.   6/22-6/25: patient feels clinically improved. Stable with oxygen via [place] and weaned down to 2-3L. Patient requiring judicial adjustments to balance between hypervolemia and renal injury with diuresis. Unable to pull fluid from lungs with thoracentesis so medical management is best option. His IV Abx to treat his vertebral osteomyelitis from previous admission continue unchanged and managed by ID.    6/28: Bed offered at [facility]  Authorization pending 7/1: No status changes.  Per patient's daughter he has been seeming more depressed.  I have offered outpatient referral to psychiatry.   7/2: Insurance authorization for LTAC continues to be pending   7/3 : Peer-to-peer review done.  Not a candidate for LTAC.   Approved for skilled nursing facility patient   7/9 : Medically stable and ready for discharge to skilled nursing facility 7/10: No acute events.  Pending insurance authorization  7/15: Acute hypoxia noted this morning.  Placed on high flow nasal cannula.  Pulmonary edema noted on check chest x-ray.  Lasix restarted.  7/16: Further deterioration of respiratory status.  Required initiation of BiPAP initiation of Lasix gtt.   Assessment & Plan:   Principal Problem:   Acute respiratory failure with hypoxia (HCC) Active Problems:   Acute on chronic heart failure with preserved ejection fraction (HFpEF) (HCC)   MSSA bacteremia   Vertebral osteomyelitis (HCC)   CAD (coronary artery disease)   Myocardial injury   Hypertension   Hyperlipidemia   Chronic kidney disease, stage 3a (HCC)   Normocytic anemia   Atrial fibrillation, chronic (HCC)   PVD (peripheral vascular disease) (HCC)   Paroxysmal atrial fibrillation (HCC)   HCAP (healthcare-associated pneumonia)   Sepsis (HCC)   Pleural effusion   Amiodarone pulmonary toxicity   Acute on chronic HFrEF (heart failure with reduced ejection fraction) (HCC)   Pleural effusion on right   Status post thoracentesis   Heart failure with preserved ejection fraction (HCC)   Stage 3 chronic kidney disease (HCC)  Acute on chronic respiratory failure with hypoxia  Was previously on room air Saturation acutely worsened on 7/15 Thought to be chf and treated with lasix gtt. RHC on 7/18 showed normal filling pressures, this does not appear to be CHF. Inflammatory markers quite elevated, viral panel negative. ABX have been broadened to zosyn Now off lasix Plan: Abx broadened to zosyn on 7/17, continuing those now, favor completing a 5-day  course (I.e. 2 more days0 Inflammatory process likely driving things, pulmonology re-consulted on 7/18 and have started IV steroids on 7/19, continue those as likely the most helpful intervention at this  point Weaning O2, down to 5 liters today (10>9>5>  Normocytic anemia Thrombocytopenia Hgb stable around 8 and platelets now stable around 150 - monitor for now  AKI on Chronic kidney disease, stage 3a (HCC): Uop adequate, gfr pretty stable 10-15. Looks like ATN. Plan: Nephrology following now hopeful for renal recovery, no indication for dialysis at the present  diastolic CHF-  Diuresis on hold as above   Pleural effusion IR unable to perform thoracentesis 6/20, 6/21. Effusion now resolved  Atrial fibrillation, chronic (HCC) Heart rate controlled.  On diltiazem and metoprolol per cardiology.  On Eliquis.  Monitor for bleeding.   Recent hx of MSSA bacteremia vertebral osteomyelitis (HCC): Received Ancef until 7/11.  This was stopped and doxycycline 100 mg p.o. twice daily x 6 weeks started per ID recommendations.  PICC line removed 7/12.  Recent penetrating atherosclerotic ulcer Repaired by vascular surgery 07/2022   CAD  Myocardial injury  HLD:  Evidence of ACS Continue Crestor, beta-blocker, Eliquis No plans for inpatient ischemic evaluation    GERD PPI  Ileus resolved  DVT prophylaxis: Eliquis Code Status: DNR Family Communication: daughter and wife at bedside 7/20 Disposition Plan: Status is: Inpatient Remains inpatient appropriate because: severe illness   Level of care: Progressive  Consultants:  Nephrology, pulmonology, cardiology  Procedures:  Right heart cath 7/18  Antimicrobials: Doxycycline zosyn   Subjective: Breathing stable. Tolerating diet. Having BMs.   Objective: Vitals:   09/21/22 0016 09/21/22 0707 09/21/22 0827 09/21/22 1208  BP: 132/74  (!) 148/80 130/72  Pulse: 65  65 67  Resp: 18  18 20   Temp: 97.6 F (36.4 C)  (!) 97.5 F (36.4 C) 97.8 F (36.6 C)  TempSrc:      SpO2: 97%  96% 96%  Weight:  76 kg    Height:        Intake/Output Summary (Last 24 hours) at 09/21/2022 1232 Last data filed at 09/21/2022 0656 Gross per  24 hour  Intake 115.82 ml  Output 1775 ml  Net -1659.18 ml   Filed Weights   09/19/22 0016 09/19/22 1339 09/21/22 0707  Weight: 80.9 kg 80.9 kg 76 kg    Examination:  General exam: Appears fatigued Respiratory system: Scattered rales  bilaterally.  Normal work of breathing.  5 L Cardiovascular system: S1-S2, RRR, no murmurs, 2+ pedal edema Gastrointestinal system: Soft, NT/ND, normal bowel sounds Central nervous system: Alert and oriented. No focal neurological deficits. Extremities: mild LE edema Skin: No rashes, lesions or ulcers Psychiatry: Judgement and insight appear normal. Mood & affect appropriate.     Data Reviewed: I have personally reviewed following labs and imaging studies  CBC: Recent Labs  Lab 09/16/22 0814 09/17/22 0924 09/18/22 1436 09/19/22 0630 09/20/22 0914  WBC 7.9 5.9 6.4 6.4 5.5  NEUTROABS 4.7 3.9  --   --   --   HGB 8.3* 7.8* 8.0* 8.3* 8.5*  HCT 25.7* 25.6* 25.2* 25.9* 27.4*  MCV 91.5 97.0 92.6 90.9 92.3  PLT 141* 130* 152 165 174   Basic Metabolic Panel: Recent Labs  Lab 09/17/22 0924 09/18/22 1436 09/19/22 0630 09/20/22 0914 09/21/22 0521  NA 140 140 141 143 143  K 3.6 3.5 3.5 3.1* 4.1  CL 104 103 101 99 102  CO2 22 26 26 28 28   GLUCOSE 104* 110* 106*  124* 122*  BUN 84* 95* 96* 99* 102*  CREATININE 3.66* 3.89* 3.93* 4.25* 4.38*  CALCIUM 8.6* 8.5* 8.8* 8.9 8.7*  MG  --   --   --  1.9  --    GFR: Estimated Creatinine Clearance: 13.5 mL/min (A) (by C-G formula based on SCr of 4.38 mg/dL (H)). Liver Function Tests: Recent Labs  Lab 09/18/22 1436  ALBUMIN 2.5*   No results for input(s): "LIPASE", "AMYLASE" in the last 168 hours. No results for input(s): "AMMONIA" in the last 168 hours. Coagulation Profile: No results for input(s): "INR", "PROTIME" in the last 168 hours. Cardiac Enzymes: No results for input(s): "CKTOTAL", "CKMB", "CKMBINDEX", "TROPONINI" in the last 168 hours. BNP (last 3 results) No results for input(s):  "PROBNP" in the last 8760 hours. HbA1C: No results for input(s): "HGBA1C" in the last 72 hours. CBG: No results for input(s): "GLUCAP" in the last 168 hours. Lipid Profile: No results for input(s): "CHOL", "HDL", "LDLCALC", "TRIG", "CHOLHDL", "LDLDIRECT" in the last 72 hours. Thyroid Function Tests: No results for input(s): "TSH", "T4TOTAL", "FREET4", "T3FREE", "THYROIDAB" in the last 72 hours. Anemia Panel: No results for input(s): "VITAMINB12", "FOLATE", "FERRITIN", "TIBC", "IRON", "RETICCTPCT" in the last 72 hours. Sepsis Labs: No results for input(s): "PROCALCITON", "LATICACIDVEN" in the last 168 hours.  Recent Results (from the past 240 hour(s))  SARS Coronavirus 2 by RT PCR (hospital order, performed in [facility] hospital lab) *cepheid single result test* Anterior Nasal Swab     Status: None   Collection Time: 09/19/22  1:31 PM   Specimen: Anterior Nasal Swab  Result Value Ref Range Status   SARS Coronavirus 2 by RT PCR NEGATIVE NEGATIVE Final    Comment: (NOTE) SARS-CoV-2 target nucleic acids are NOT DETECTED.  The SARS-CoV-2 RNA is generally detectable in upper and lower respiratory specimens during the acute phase of infection. The lowest concentration of SARS-CoV-2 viral copies this assay can detect is 250 copies / mL. A negative result does not preclude SARS-CoV-2 infection and should not be used as the sole basis for treatment or other patient management decisions.  A negative result may occur with improper specimen collection / handling, submission of specimen other than nasopharyngeal swab, presence of viral mutation(s) within the areas targeted by this assay, and inadequate number of viral copies (<250 copies / mL). A negative result must be combined with clinical observations, patient history, and epidemiological information.  Fact Sheet for Patients:   RoadLapTop.co.za  Fact Sheet for Healthcare  Providers: http://kim-miller.com/  This test is not yet approved or  cleared by the [place] FDA and has been authorized for detection and/or diagnosis of SARS-CoV-2 by FDA under an Emergency Use Authorization (EUA).  This EUA will remain in effect (meaning this test can be used) for the duration of the COVID-19 declaration under Section 564(b)(1) of the Act, 21 U.S.C. section 360bbb-3(b)(1), unless the authorization is terminated or revoked sooner.  Performed at [facility], [address] [place], [place] [address]   Respiratory (~20 pathogens) panel by PCR     Status: None   Collection Time: 09/19/22  1:31 PM   Specimen: Nasopharyngeal Swab; Respiratory  Result Value Ref Range Status   Adenovirus NOT DETECTED NOT DETECTED Final   Coronavirus 229E NOT DETECTED NOT DETECTED Final    Comment: (NOTE) The Coronavirus on the Respiratory Panel, DOES NOT test for the novel  Coronavirus (2019 nCoV)    Coronavirus HKU1 NOT DETECTED NOT DETECTED Final   Coronavirus NL63 NOT  DETECTED NOT DETECTED Final   Coronavirus OC43 NOT DETECTED NOT DETECTED Final   Metapneumovirus NOT DETECTED NOT DETECTED Final   Rhinovirus / Enterovirus NOT DETECTED NOT DETECTED Final   Influenza A NOT DETECTED NOT DETECTED Final   Influenza B NOT DETECTED NOT DETECTED Final   Parainfluenza Virus 1 NOT DETECTED NOT DETECTED Final   Parainfluenza Virus 2 NOT DETECTED NOT DETECTED Final   Parainfluenza Virus 3 NOT DETECTED NOT DETECTED Final   Parainfluenza Virus 4 NOT DETECTED NOT DETECTED Final   Respiratory Syncytial Virus NOT DETECTED NOT DETECTED Final   Bordetella pertussis NOT DETECTED NOT DETECTED Final   Bordetella Parapertussis NOT DETECTED NOT DETECTED Final   Chlamydophila pneumoniae NOT DETECTED NOT DETECTED Final   Mycoplasma pneumoniae NOT DETECTED NOT DETECTED Final    Comment: Performed at [facility], [address] [place], [place] [address]          Radiology Studies: CARDIAC CATHETERIZATION  Result Date: 09/19/2022 Conclusions: Normal left heart, right heart, and pulmonary artery pressures. Normal cardiac output/index. Recommendations: Findings suggest that worsening hypoxia and lung opacities are not due to cardiogenic edema.  Continue to hold diuretics. Further workup of non-cardiac causes of respiratory decompensation per internal medicine and pulmonology. Yvonne Kendall, MD [facility]       Scheduled Meds:  acidophilus  1 capsule Oral Daily   apixaban  2.5 mg Oral BID   cyanocobalamin  1,000 mcg Oral Daily   diltiazem  360 mg Oral Daily   doxycycline  100 mg Oral Q12H   feeding supplement  237 mL Oral BID BM   liver oil-zinc oxide   Topical TID   methylPREDNISolone (SOLU-MEDROL) injection  40 mg Intravenous Q24H   metoprolol succinate  100 mg Oral Daily   multivitamins with iron  1 tablet Oral Daily   pantoprazole  40 mg Oral Daily   polyethylene glycol  17 g Oral Daily   rosuvastatin  10 mg Oral Daily   senna-docusate  1 tablet Oral BID   sodium chloride flush  3 mL Intravenous Q12H   Continuous Infusions:  sodium chloride 10 mL/hr at 09/21/22 0314   sodium chloride     piperacillin-tazobactam (ZOSYN)  IV 3.375 g (09/21/22 0812)     LOS: 34 days     Silvano Bilis, MD [facility]   If 7PM-7AM, please contact night-coverage  09/21/2022, 12:32 PM

## 2022-09-21 NOTE — Progress Notes (Signed)
Rounding Note    Patient Name: Brett Riley Date of Encounter: 09/21/2022  [facility] Cardiologist: Lorine Bears, MD   Subjective   Patient reports breathing feels better. He is still on 6L O2. NO chest pain reported. Scr worse this AM.   Inpatient Medications    Scheduled Meds:  acidophilus  1 capsule Oral Daily   apixaban  2.5 mg Oral BID   cyanocobalamin  1,000 mcg Oral Daily   diltiazem  360 mg Oral Daily   doxycycline  100 mg Oral Q12H   feeding supplement  237 mL Oral BID BM   liver oil-zinc oxide   Topical TID   methylPREDNISolone (SOLU-MEDROL) injection  40 mg Intravenous Q24H   metoprolol succinate  100 mg Oral Daily   multivitamins with iron  1 tablet Oral Daily   pantoprazole  40 mg Oral Daily   polyethylene glycol  17 g Oral Daily   rosuvastatin  10 mg Oral Daily   senna-docusate  1 tablet Oral BID   sodium chloride flush  3 mL Intravenous Q12H   Continuous Infusions:  sodium chloride 10 mL/hr at 09/21/22 0314   sodium chloride     piperacillin-tazobactam (ZOSYN)  IV 3.375 g (09/21/22 0812)   PRN Meds: sodium chloride, sodium chloride, acetaminophen, albuterol, bisacodyl, dextromethorphan-guaiFENesin, melatonin, ondansetron (ZOFRAN) IV, simethicone, sodium chloride, sodium chloride flush, traMADol   Vital Signs    Vitals:   09/20/22 2011 09/21/22 0016 09/21/22 0707 09/21/22 0827  BP:  132/74  (!) 148/80  Pulse:  65  65  Resp:  18  18  Temp:  97.6 F (36.4 C)  (!) 97.5 F (36.4 C)  TempSrc:      SpO2: 97% 97%  96%  Weight:   76 kg   Height:        Intake/Output Summary (Last 24 hours) at 09/21/2022 0946 Last data filed at 09/21/2022 0656 Gross per 24 hour  Intake 132.05 ml  Output 1775 ml  Net -1642.95 ml      09/21/2022    7:07 AM 09/19/2022    1:39 PM 09/19/2022   12:16 AM  Last 3 Weights  Weight (lbs) 167 lb 8 oz 178 lb 5.6 oz 178 lb 5.6 oz  Weight (kg) 75.978 kg 80.9 kg 80.9 kg      Telemetry    N/A - Personally  Reviewed  ECG    No new - Personally Reviewed  Physical Exam   GEN: No acute distress.   Neck: No JVD Cardiac: RRR, no murmurs, rubs, or gallops.  Respiratory: rhonchi and mild crackles. GI: Soft, nontender, non-distended  MS: No edema; No deformity. Neuro:  Nonfocal  Psych: Normal affect   Labs    High Sensitivity Troponin:  No results for input(s): "TROPONINIHS" in the last 720 hours.   Chemistry Recent Labs  Lab 09/18/22 1436 09/19/22 0630 09/20/22 0914 09/21/22 0521  NA 140 141 143 143  K 3.5 3.5 3.1* 4.1  CL 103 101 99 102  CO2 26 26 28 28   GLUCOSE 110* 106* 124* 122*  BUN 95* 96* 99* 102*  CREATININE 3.89* 3.93* 4.25* 4.38*  CALCIUM 8.5* 8.8* 8.9 8.7*  MG  --   --  1.9  --   ALBUMIN 2.5*  --   --   --   GFRNONAA 15* 14* 13* 13*  ANIONGAP 11 14 16* 13    Lipids No results for input(s): "CHOL", "TRIG", "HDL", "LABVLDL", "LDLCALC", "CHOLHDL" in the last 168  hours.  Hematology Recent Labs  Lab 09/18/22 1436 09/19/22 0630 09/20/22 0914  WBC 6.4 6.4 5.5  RBC 2.72* 2.85* 2.97*  HGB 8.0* 8.3* 8.5*  HCT 25.2* 25.9* 27.4*  MCV 92.6 90.9 92.3  MCH 29.4 29.1 28.6  MCHC 31.7 32.0 31.0  RDW 15.8* 15.7* 15.3  PLT 152 165 174   Thyroid No results for input(s): "TSH", "FREET4" in the last 168 hours.  BNP Recent Labs  Lab 09/19/22 0629  BNP 264.2*    DDimer No results for input(s): "DDIMER" in the last 168 hours.   Radiology    CARDIAC CATHETERIZATION  Result Date: 09/19/2022 Conclusions: Normal left heart, right heart, and pulmonary artery pressures. Normal cardiac output/index. Recommendations: Findings suggest that worsening hypoxia and lung opacities are not due to cardiogenic edema.  Continue to hold diuretics. Further workup of non-cardiac causes of respiratory decompensation per internal medicine and pulmonology. Yvonne Kendall, MD [facility]   Cardiac Studies   RHC 09/19/22 Conclusions: Normal left heart, right heart, and pulmonary artery  pressures. Normal cardiac output/index.   Recommendations: Findings suggest that worsening hypoxia and lung opacities are not due to cardiogenic edema.  Continue to hold diuretics. Further workup of non-cardiac causes of respiratory decompensation per internal medicine and pulmonology.   Yvonne Kendall, MD [facility]  Limited echo 08/19/2022: 1. Left ventricular ejection fraction, by estimation, is 50 to 55%. The  left ventricle has low normal function. Left ventricular endocardial  border not optimally defined to evaluate regional wall motion.   2. Right ventricular systolic function is normal.  __________   TEE 08/05/2022: 1. Left ventricular ejection fraction, by estimation, is 55 to 60%. The  left ventricle has normal function.   2. Right ventricular systolic function is normal. The right ventricular  size is normal.   3. No left atrial/left atrial appendage thrombus was detected.   4. The mitral valve is normal in structure. Mild mitral valve  regurgitation.   5. The aortic valve is tricuspid. Aortic valve regurgitation is trivial.   Conclusion(s)/Recommendation(s): No evidence of vegetation/infective  endocarditis on this transesophageael echocardiogram.  __________   2D echo 07/22/2022: 1. Left ventricular ejection fraction, by estimation, is 55 to 60%. The  left ventricle has normal function. The left ventricle has no regional  wall motion abnormalities. Left ventricular diastolic parameters are  consistent with Grade I diastolic  dysfunction (impaired relaxation).   2. Right ventricular systolic function is normal. The right ventricular  size is normal. There is normal pulmonary artery systolic pressure.   3. The mitral valve is normal in structure. Mild to moderate mitral valve  regurgitation. No evidence of mitral stenosis. Moderate mitral annular  calcification.   4. The aortic valve is normal in structure. Aortic valve regurgitation is  trivial. Aortic valve  sclerosis is present, with no evidence of aortic  valve stenosis.   5. The inferior vena cava is normal in size with greater than 50%  respiratory variability, suggesting right atrial pressure of 3 mmHg.   Patient Profile     87 y.o. 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   Assessment & Plan   Acute on chronic respiratory failure with hypoxia Acute on chronic HFpEF - multifactorial given pleural effusions, possible PNA, anemia, deconditioning, possible inflammatory lung disease - he was previously on lasix drip - RHC 7/18 showed normal left heart, right heart and pulmonary artery pressures. Suggest worsening hypoxia are not due to cardiogenic edema. - Pulmonology treating with IV steroids for ARDS vs ILD/bronchiectasis. CRP and ESR were severely elevated - IV abx per IM - LLE on exam. Likely third spacing with low albumin  AKI on CKD stage 3 - baseline Scr 1.05 - nephrology following - AKI felt secondary to ATN vs AIN from cefazolin  - IV contrast exposure 5/22 - Scr 3.93>4.25>4.38  Persistent Afib - patient is in rate controlled Afib on metoprolol and diltiazem - continue Eliquis   Anemia and thrombocytopenia - Hgb stable around 8   For questions or updates, please contact [facility] Please consult [email or web address] for contact info under         Signed, Deadra Diggins David Stall, PA-C  09/21/2022, 9:46 AM

## 2022-09-21 NOTE — Progress Notes (Signed)
PULMONOLOGY         Date: 09/21/2022,   MRN# [id number] Brett Riley 14-Oct-1935     AdmissionWeight: 91.9 kg                 CurrentWeight: 80.9 kg  Referring provider: Dr End   CHIEF COMPLAINT:   Acute on chronic hypoxemic respiratory failure   HISTORY OF PRESENT ILLNESS   This is a 87 year old with a history of skin cancer, carotid disease, celiac artery stenosis, coronary disease status post cardiac cath with findings multivessel disease and recommendation for coronary artery bypass, sensorineural hearing loss, systolic CHF, dyslipidemia, nephrolithiasis, hip osteoarthritis, atrial fibrillation who came into the hospital 32 days ago for acute on chronic hypoxemic respiratory failure after having an admission in May to June 2024 for aortic ulcer repair which was complicated by sepsis and Staph aureus bacteremia as well as osteomyelitis of the spine.  Patient completed full course of antimicrobials in July 2024.  He was found to be hypoxemic and initially required BiPAP diuresed and had chest physiotherapy with improvement to 2 to 3 L nasal cannula.  Was also seen by cardiology at medical management diuresed approximately 6 L which helped, however while waiting for LTAC approval apparently progressively became more dyspneic and hypoxemic despite ongoing diuresis.  PCCM consultation was placed for increased O2 requirement with acute on chronic hypoxemic respiratory failure.  09/20/22- patient remains hypoxemic, this am able to eat breakfast with help from wife at bedside.  Inflammatory biomarkers show marked elevation, this generally is not seen with fluid imbalance.  Viral workup was negative however he may still have some viral LRTI that is not screened on our platform or other cause of inflammation.  There is definite bronchiectasis on CT chest with ground glass infiltrates with antero posterior gradient seen with ARDS vs acute exacerbation of ILD/bronchiectasis.  Plan to  restart systemic steroids with solumedrol today at 40mg  IV.   09/21/22- patient is improved today.  He is down to 5L/min.  He shares breathing is less labored.  He was able to sleep better due to less distress.  He may need Lung or Kidney biopsy to evaluate underlying connective tissue disease which may explain his chronic bronchiectatic lungs and possibly involvement of kidney as well. He seems to be responding to steroids.  Will keep dose of IV solumedrol at 40mg  daily for now.    PAST MEDICAL HISTORY   Past Medical History:  Diagnosis Date   Cancer [facility])    skin cancer basal   Carotid arterial disease (HCC)    a. 02/2016 L CEA; b. 01/2017 U/S: patent LICA, 1-39% RICA.   Celiac artery stenosis (HCC)    Coronary artery disease    a. 01/2016 MV: mild apical/basal inferior, apical lateral, mid anterolateral, and mid inferolateral ischemia. EF 57%; b. 02/2016 Cath: LM 40ost, LAD 70p/m, 20d, D1 95 (small), LCX nl, RCA 61m, RPDA 90 (small), EF 55-65%-->med Rx. Rec CABG for recurrent symptoms.   Hearing loss    History of echocardiogram    a. 02/2016 Echo: EF 50-55%, no rwma, mild MR.   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   Osteoarthritis, hip, bilateral    Left > Right     SURGICAL HISTORY   Past Surgical History:  Procedure Laterality Date   AORTIC INTERVENTION N/A 07/24/2022   Procedure: AORTIC INTERVENTION;  Surgeon:  Schnier, Latina Craver, MD;  Location: [facility] INVASIVE CV [facility];  Service: Cardiovascular;  Laterality: N/A;   CARDIAC CATHETERIZATION N/A 01/19/2016   Procedure: Left Heart Cath and Coronary Angiography;  Surgeon: Iran Ouch, MD;  Location: [facility] INV[facility] CV [facility];  Service: Cardiovascular;  Laterality: N/A;   COLONOSCOPY     CYSTOSCOPY/URETEROSCOPY/HOLMIUM LASER/STENT PLACEMENT Right 04/13/2021   Procedure: CYSTOSCOPY/URETEROSCOPY/HOLMIUM LASER/STENT PLACEMENT;  Surgeon: Sondra Come, MD;   Location: [facility] ORS;  Service: Urology;  Laterality: Right;   ENDARTERECTOMY Left 02/15/2016   Procedure: ENDARTERECTOMY CAROTID;  Surgeon: Annice Needy, MD;  Location: [facility] ORS;  Service: Vascular;  Laterality: Left;   RIGHT HEART CATH N/A 09/19/2022   Procedure: RIGHT HEART CATH;  Surgeon: Yvonne Kendall, MD;  Location: [facility] CV [facility];  Service: Cardiovascular;  Laterality: N/A;   TEE WITHOUT CARDIOVERSION N/A 08/05/2022   Procedure: TRANSESOPHAGEAL ECHOCARDIOGRAM;  Surgeon: Debbe Odea, MD;  Location: [facility] OR[facility];  Service: Cardiovascular;  Laterality: N/A;     FAMILY HISTORY   Family History  Problem Relation Age of Onset   Stroke Mother    Heart disease Father        MI   Heart attack Father    Breast cancer Sister    Colon cancer Neg Hx    Prostate cancer Neg Hx      SOCIAL HISTORY   Social History   Tobacco Use   Smoking status: Never   Smokeless tobacco: Never  Vaping Use   Vaping status: Never Used  Substance Use Topics   Alcohol use: No    Alcohol/week: 0.0 standard drinks of alcohol   Drug use: No     MEDICATIONS    Home Medication:  Current Outpatient Rx   Order #: [id number] Class: Normal   Order #: [id number] Class: Normal   Order #: [id number] Class: Normal    Current Medication:  Current Facility-Administered Medications:    0.9 %  sodium chloride infusion, , Intravenous, PRN, End, Christopher, MD, Last Rate: 10 mL/hr at 09/21/22 0314, Infusion Verify at 09/21/22 0314   0.9 %  sodium chloride infusion, 250 mL, Intravenous, PRN, End, Cristal Deer, MD   acetaminophen (TYLENOL) tablet 650 mg, 650 mg, Oral, Q6H PRN, End, Christopher, MD, 650 mg at 09/13/22 1713   acidophilus (RISAQUAD) capsule 1 capsule, 1 capsule, Oral, Daily, End, Christopher, MD, 1 capsule at 09/20/22 0944   albuterol (PROVENTIL) (2.5 MG/3ML) 0.083% nebulizer solution 2.5 mg, 2.5 mg, Nebulization, Q4H PRN, End, Christopher, MD, 2.5 mg at 09/16/22 0739   apixaban (ELIQUIS) tablet  2.5 mg, 2.5 mg, Oral, BID, End, Christopher, MD, 2.5 mg at 09/20/22 2107   bisacodyl (DULCOLAX) suppository 10 mg, 10 mg, Rectal, Daily PRN, End, Cristal Deer, MD   cyanocobalamin (VITAMIN B12) tablet 1,000 mcg, 1,000 mcg, Oral, Daily, End, Christopher, MD, 1,000 mcg at 09/20/22 0944   dextromethorphan-guaiFENesin (MUCINEX DM) 30-600 MG per 12 hr tablet 1 tablet, 1 tablet, Oral, BID PRN, End, Christopher, MD, 1 tablet at 09/20/22 2107   diltiazem (CARDIZEM CD) 24 hr capsule 360 mg, 360 mg, Oral, Daily, End, Christopher, MD, 360 mg at 09/20/22 0944   doxycycline (VIBRA-TABS) tablet 100 mg, 100 mg, Oral, Q12H, End, Christopher, MD, 100 mg at 09/20/22 2107   feeding supplement (ENSURE ENLIVE / ENSURE PLUS) liquid 237 mL, 237 mL, Oral, BID BM, End, Christopher, MD, 237 mL at 09/20/22 0945   liver oil-zinc oxide (DESITIN) 40 % ointment, , Topical, TID, End, Cristal Deer, MD, Given at 09/20/22 2108   melatonin tablet 2.5  mg, 2.5 mg, Oral, QHS PRN, End, Christopher, MD, 2.5 mg at 09/16/22 2148   methylPREDNISolone sodium succinate (SOLU-MEDROL) 40 mg/mL injection 40 mg, 40 mg, Intravenous, Q24H, Najeeb Uptain, MD, 40 mg at 09/20/22 0944   metoprolol succinate (TOPROL-XL) 24 hr tablet 100 mg, 100 mg, Oral, Daily, End, Christopher, MD, 100 mg at 09/20/22 0945   multivitamins with iron tablet 1 tablet, 1 tablet, Oral, Daily, End, Christopher, MD, 1 tablet at 09/20/22 0946   ondansetron (ZOFRAN) injection 4 mg, 4 mg, Intravenous, Q8H PRN, End, Christopher, MD, 4 mg at 09/17/22 1754   pantoprazole (PROTONIX) EC tablet 40 mg, 40 mg, Oral, Daily, End, Christopher, MD, 40 mg at 09/20/22 0945   piperacillin-tazobactam (ZOSYN) IVPB 3.375 g, 3.375 g, Intravenous, Q12H, End, Christopher, MD, Stopped at 09/21/22 0013   polyethylene glycol (MIRALAX / GLYCOLAX) packet 17 g, 17 g, Oral, Daily, End, Christopher, MD, 17 g at 09/18/22 1037   rosuvastatin (CRESTOR) tablet 10 mg, 10 mg, Oral, Daily, End, Christopher, MD, 10 mg  at 09/20/22 0944   senna-docusate (Senokot-S) tablet 1 tablet, 1 tablet, Oral, BID, End, Christopher, MD, 1 tablet at 09/20/22 2107   simethicone (MYLICON) chewable tablet 80 mg, 80 mg, Oral, QID PRN, End, Christopher, MD, 80 mg at 08/20/22 2121   sodium chloride (OCEAN) 0.65 % nasal spray 1 spray, 1 spray, Each Nare, PRN, End, Christopher, MD, 1 spray at 08/27/22 0319   sodium chloride flush (NS) 0.9 % injection 3 mL, 3 mL, Intravenous, Q12H, End, Christopher, MD, 3 mL at 09/20/22 2109   sodium chloride flush (NS) 0.9 % injection 3 mL, 3 mL, Intravenous, PRN, End, Cristal Deer, MD   traMADol Janean Sark) tablet 50 mg, 50 mg, Oral, Q6H PRN, End, Christopher, MD, 50 mg at 09/20/22 2107    ALLERGIES   Patient has no known allergies.     REVIEW OF SYSTEMS    Review of Systems:  Gen:  Denies  fever, sweats, chills weigh loss  HEENT: Denies blurred vision, double vision, ear pain, eye pain, hearing loss, nose bleeds, sore throat Cardiac:  No dizziness, chest pain or heaviness, chest tightness,edema Resp:   reports dyspnea chronically  Gi: Denies swallowing difficulty, stomach pain, nausea or vomiting, diarrhea, constipation, bowel incontinence Gu:  Denies bladder incontinence, burning urine Ext:   Denies Joint pain, stiffness or swelling Skin: Denies  skin rash, easy bruising or bleeding or hives Endoc:  Denies polyuria, polydipsia , polyphagia or weight change Psych:   Denies depression, insomnia or hallucinations   Other:  All other systems negative   VS: BP 132/74 (BP Location: Left Arm)   Pulse 65   Temp 97.6 F (36.4 C)   Resp 18   Ht 6' (1.829 m)   Wt 80.9 kg   SpO2 97%   BMI 24.19 kg/m      PHYSICAL EXAM    GENERAL:NAD, no fevers, chills, no weakness no fatigue HEAD: Normocephalic, atraumatic.  EYES: Pupils equal, round, reactive to light. Extraocular muscles intact. No scleral icterus.  MOUTH: Moist mucosal membrane. Dentition intact. No abscess noted.  EAR,  NOSE, THROAT: Clear without exudates. No external lesions.  NECK: Supple. No thyromegaly. No nodules. No JVD.  PULMONARY: decreased breath sounds with mild rhonchi worse at bases bilaterally.  CARDIOVASCULAR: S1 and S2. Regular rate and rhythm. No murmurs, rubs, or gallops. No edema. Pedal pulses 2+ bilaterally.  GASTROINTESTINAL: Soft, nontender, nondistended. No masses. Positive bowel sounds. No hepatosplenomegaly.  MUSCULOSKELETAL: No swelling, clubbing, or edema. Range of  motion full in all extremities.  NEUROLOGIC: Cranial nerves II through XII are intact. No gross focal neurological deficits. Sensation intact. Reflexes intact.  SKIN: No ulceration, lesions, rashes, or cyanosis. Skin warm and dry. Turgor intact.  PSYCHIATRIC: Mood, affect within normal limits. The patient is awake, alert and oriented x 3. Insight, judgment intact.       IMAGING     ASSESSMENT/PLAN    Acute on chronic hypoxemic respiratory failure - compared to previous CT chest 09/05/22 infiltrates have worsened with interstitial infiltrates bilaterally and bronchiectasis in the background.  -CRP and ESR severely elevated indicative of underlying active systemic process.   Viral workup thus far negative, starting steroids via IV today. -s/p RHC - appreciate cardiology - right heart metrics are within reference range without PH -COVID and RVP reswabbing- negative  -ON  zosyn and bactrim  -there is no leukocytosis and patient does not appear to be septic clinically, consider consultation with ID to stop antimicrobials to help preserve renal function/Cdiff devt -previous serology for autoimmune disease was negative but he may need biopsy of lung/kidney for evaluation of vasculitis or connective tissue etiology      Thank you for allowing me to participate in the care of this patient.   Patient/Family are satisfied with care plan and all questions have been answered.    Provider disclosure: Patient with at least  one acute or chronic illness or injury that poses a threat to life or bodily function and is being managed actively during this encounter.  All of the below services have been performed independently by signing provider:  review of prior documentation from internal and or external health records.  Review of previous and current lab results.  Interview and comprehensive assessment during patient visit today. Review of current and previous chest radiographs/CT scans. Discussion of management and test interpretation with health care team and patient/family.   This document was prepared using Dragon voice recognition software and may include unintentional dictation errors.     Vida Rigger, M.D.  Division of Pulmonary & Critical Care Medicine

## 2022-09-21 NOTE — Progress Notes (Signed)
[facility]  ROUNDING NOTE   Subjective:   Patient seen sitting up in bed Alert and oriented Wife at bedside Patient and wife state he looks and feels good  today  Oxygen weaned to 5L HFNC Lower extremity edema improving  Creatinine 4.38 UOP in past 24 hours   Objective:  Vital signs in last 24 hours:  Temp:  [97.5 F (36.4 C)-98.8 F (37.1 C)] 97.5 F (36.4 C) (07/20 0827) Pulse Rate:  [65-76] 65 (07/20 0827) Resp:  [18-20] 18 (07/20 0827) BP: (132-148)/(74-80) 148/80 (07/20 0827) SpO2:  [96 %-98 %] 96 % (07/20 0827) Weight:  [76 kg] 76 kg (07/20 0707)  Weight change:  Filed Weights   09/19/22 0016 09/19/22 1339 09/21/22 0707  Weight: 80.9 kg 80.9 kg 76 kg    Intake/Output: I/O last 3 completed shifts: In: 313.4 [I.V.:163.3; IV Piggyback:150.1] Out: 3075 [Urine:3075]   Intake/Output this shift:  No intake/output data recorded.  Physical Exam: General: NAD, laying in bed  Head: Normocephalic, atraumatic. Moist oral mucosal membranes  Eyes: Anicteric  Lungs:  Bilateral crackles, HFNC  Heart: Regular rate and rhythm  Abdomen:  +distended, nontender  Extremities:  2+ peripheral edema.  TED hose  Neurologic: Alert, moving all four extremities  Skin: No lesions  Access: none    Basic Metabolic Panel: Recent Labs  Lab 09/17/22 0924 09/18/22 1436 09/19/22 0630 09/20/22 0914 09/21/22 0521  NA 140 140 141 143 143  K 3.6 3.5 3.5 3.1* 4.1  CL 104 103 101 99 102  CO2 22 26 26 28 28   GLUCOSE 104* 110* 106* 124* 122*  BUN 84* 95* 96* 99* 102*  CREATININE 3.66* 3.89* 3.93* 4.25* 4.38*  CALCIUM 8.6* 8.5* 8.8* 8.9 8.7*  MG  --   --   --  1.9  --     Liver Function Tests: Recent Labs  Lab 09/18/22 1436  ALBUMIN 2.5*    No results for input(s): "LIPASE", "AMYLASE" in the last 168 hours. No results for input(s): "AMMONIA" in the last 168 hours.  CBC: Recent Labs  Lab 09/16/22 0814 09/17/22 0924 09/18/22 1436 09/19/22 0630  09/20/22 0914  WBC 7.9 5.9 6.4 6.4 5.5  NEUTROABS 4.7 3.9  --   --   --   HGB 8.3* 7.8* 8.0* 8.3* 8.5*  HCT 25.7* 25.6* 25.2* 25.9* 27.4*  MCV 91.5 97.0 92.6 90.9 92.3  PLT 141* 130* 152 165 174    Cardiac Enzymes: No results for input(s): "CKTOTAL", "CKMB", "CKMBINDEX", "TROPONINI" in the last 168 hours.   BNP: Invalid input(s): "POCBNP"  CBG: No results for input(s): "GLUCAP" in the last 168 hours.   Microbiology: Results for orders placed or performed during the hospital encounter of 08/18/22  Blood culture (routine x 2)     Status: None   Collection Time: 08/18/22 10:45 AM   Specimen: BLOOD LEFT ARM  Result Value Ref Range Status   Specimen Description BLOOD LEFT ARM  Final   Special Requests   Final    BOTTLES DRAWN AEROBIC AND ANAEROBIC Blood Culture adequate volume   Culture   Final    NO GROWTH 5 DAYS Performed at [facility], [address], [place], [place] [address]    Report Status 08/23/2022 FINAL  Final  Resp Panel by RT-PCR (Flu A&B, Covid) Anterior Nasal Swab     Status: None   Collection Time: 08/18/22 10:46 AM   Specimen: Anterior Nasal Swab  Result Value Ref Range Status   SARS  Coronavirus 2 by RT PCR NEGATIVE NEGATIVE Final    Comment: (NOTE) SARS-CoV-2 target nucleic acids are NOT DETECTED.  The SARS-CoV-2 RNA is generally detectable in upper respiratory specimens during the acute phase of infection. The lowest concentration of SARS-CoV-2 viral copies this assay can detect is 138 copies/mL. A negative result does not preclude SARS-Cov-2 infection and should not be used as the sole basis for treatment or other patient management decisions. A negative result may occur with  improper specimen collection/handling, submission of specimen other than nasopharyngeal swab, presence of viral mutation(s) within the areas targeted by this assay, and inadequate number of viral copies(<138 copies/mL). A negative result must be combined  with clinical observations, patient history, and epidemiological information. The expected result is Negative.  Fact Sheet for Patients:  BloggerCourse.com  Fact Sheet for Healthcare Providers:  SeriousBroker.it  This test is no t yet approved or cleared by the [place] FDA and  has been authorized for detection and/or diagnosis of SARS-CoV-2 by FDA under an Emergency Use Authorization (EUA). This EUA will remain  in effect (meaning this test can be used) for the duration of the COVID-19 declaration under Section 564(b)(1) of the Act, 21 U.S.C.section 360bbb-3(b)(1), unless the authorization is terminated  or revoked sooner.       Influenza A by PCR NEGATIVE NEGATIVE Final   Influenza B by PCR NEGATIVE NEGATIVE Final    Comment: (NOTE) The Xpert Xpress SARS-CoV-2/FLU/RSV plus assay is intended as an aid in the diagnosis of influenza from Nasopharyngeal swab specimens and should not be used as a sole basis for treatment. Nasal washings and aspirates are unacceptable for Xpert Xpress SARS-CoV-2/FLU/RSV testing.  Fact Sheet for Patients: BloggerCourse.com  Fact Sheet for Healthcare Providers: SeriousBroker.it  This test is not yet approved or cleared by the [place] FDA and has been authorized for detection and/or diagnosis of SARS-CoV-2 by FDA under an Emergency Use Authorization (EUA). This EUA will remain in effect (meaning this test can be used) for the duration of the COVID-19 declaration under Section 564(b)(1) of the Act, 21 U.S.C. section 360bbb-3(b)(1), unless the authorization is terminated or revoked.  Performed at [facility], [address] [place], [place] [address]   Culture, blood (Routine X 2) w Reflex to ID Panel     Status: None   Collection Time: 08/18/22  9:19 PM   Specimen: BLOOD LEFT ARM  Result Value Ref Range Status   Specimen  Description BLOOD LEFT ARM  Final   Special Requests   Final    BOTTLES DRAWN AEROBIC AND ANAEROBIC Blood Culture adequate volume   Culture   Final    NO GROWTH 5 DAYS Performed at [facility], [address] [place], [place] [address]    Report Status 08/23/2022 FINAL  Final  Respiratory (~20 pathogens) panel by PCR     Status: None   Collection Time: 08/21/22  1:50 PM   Specimen: Nasopharyngeal Swab; Respiratory  Result Value Ref Range Status   Adenovirus NOT DETECTED NOT DETECTED Final   Coronavirus 229E NOT DETECTED NOT DETECTED Final    Comment: (NOTE) The Coronavirus on the Respiratory Panel, DOES NOT test for the novel  Coronavirus (2019 nCoV)    Coronavirus HKU1 NOT DETECTED NOT DETECTED Final   Coronavirus NL63 NOT DETECTED NOT DETECTED Final   Coronavirus OC43 NOT DETECTED NOT DETECTED Final   Metapneumovirus NOT DETECTED NOT DETECTED Final   Rhinovirus / Enterovirus NOT DETECTED NOT DETECTED Final  Influenza A NOT DETECTED NOT DETECTED Final   Influenza B NOT DETECTED NOT DETECTED Final   Parainfluenza Virus 1 NOT DETECTED NOT DETECTED Final   Parainfluenza Virus 2 NOT DETECTED NOT DETECTED Final   Parainfluenza Virus 3 NOT DETECTED NOT DETECTED Final   Parainfluenza Virus 4 NOT DETECTED NOT DETECTED Final   Respiratory Syncytial Virus NOT DETECTED NOT DETECTED Final   Bordetella pertussis NOT DETECTED NOT DETECTED Final   Bordetella Parapertussis NOT DETECTED NOT DETECTED Final   Chlamydophila pneumoniae NOT DETECTED NOT DETECTED Final   Mycoplasma pneumoniae NOT DETECTED NOT DETECTED Final    Comment: Performed at [facility], [address] [place], [place] [address]  Culture, blood (Routine X 2) w Reflex to ID Panel     Status: None   Collection Time: 08/21/22  8:58 PM   Specimen: BLOOD  Result Value Ref Range Status   Specimen Description BLOOD LEFT WRIST  Final   Special Requests   Final    BOTTLES DRAWN AEROBIC AND ANAEROBIC Blood  Culture adequate volume   Culture   Final    NO GROWTH 5 DAYS Performed at [facility], [address] [place], [place] [address]    Report Status 08/26/2022 FINAL  Final  Culture, blood (Routine X 2) w Reflex to ID Panel     Status: None   Collection Time: 08/21/22 11:01 PM   Specimen: BLOOD  Result Value Ref Range Status   Specimen Description BLOOD BLOOD LEFT ARM  Final   Special Requests   Final    BOTTLES DRAWN AEROBIC AND ANAEROBIC Blood Culture adequate volume   Culture   Final    NO GROWTH 5 DAYS Performed at [facility], [address], [place], [place] [address]    Report Status 08/26/2022 FINAL  Final  Expectorated Sputum Assessment w Gram Stain, Rflx to Resp Cult     Status: None   Collection Time: 08/22/22 11:22 AM   Specimen: Sputum  Result Value Ref Range Status   Specimen Description SPUTUM  Final   Special Requests EXPSU  Final   Sputum evaluation   Final    THIS SPECIMEN IS ACCEPTABLE FOR SPUTUM CULTURE Performed at [facility], [address] [place], [place] [address]    Report Status 08/22/2022 FINAL  Final  Culture, Respiratory w Gram Stain     Status: None   Collection Time: 08/22/22 11:22 AM   Specimen: SPU  Result Value Ref Range Status   Specimen Description   Final    SPUTUM Performed at [facility], [address], [place], [place] [address]    Special Requests   Final    EXPSU Reflexed from [id number] Performed at [facility], [address] [place], [place] [address]    Gram Stain   Final    ABUNDANT SQUAMOUS EPITHELIAL CELLS PRESENT FEW WBC PRESENT, PREDOMINANTLY PMN ABUNDANT GRAM VARIABLE ROD ABUNDANT GRAM POSITIVE COCCI IN PAIRS    Culture   Final    Normal respiratory flora-no Staph aureus or Pseudomonas seen Performed at [facility], [address], [place], [place] [address]    Report Status 08/25/2022 FINAL  Final  SARS Coronavirus 2 by RT PCR (hospital order,  performed in [facility] hospital lab) *cepheid single result test* Anterior Nasal Swab     Status: None   Collection Time: 09/19/22  1:31 PM   Specimen: Anterior Nasal Swab  Result Value Ref Range Status  SARS Coronavirus 2 by RT PCR NEGATIVE NEGATIVE Final    Comment: (NOTE) SARS-CoV-2 target nucleic acids are NOT DETECTED.  The SARS-CoV-2 RNA is generally detectable in upper and lower respiratory specimens during the acute phase of infection. The lowest concentration of SARS-CoV-2 viral copies this assay can detect is 250 copies / mL. A negative result does not preclude SARS-CoV-2 infection and should not be used as the sole basis for treatment or other patient management decisions.  A negative result may occur with improper specimen collection / handling, submission of specimen other than nasopharyngeal swab, presence of viral mutation(s) within the areas targeted by this assay, and inadequate number of viral copies (<250 copies / mL). A negative result must be combined with clinical observations, patient history, and epidemiological information.  Fact Sheet for Patients:   RoadLapTop.co.za  Fact Sheet for Healthcare Providers: http://kim-miller.com/  This test is not yet approved or  cleared by the [place] FDA and has been authorized for detection and/or diagnosis of SARS-CoV-2 by FDA under an Emergency Use Authorization (EUA).  This EUA will remain in effect (meaning this test can be used) for the duration of the COVID-19 declaration under Section 564(b)(1) of the Act, 21 U.S.C. section 360bbb-3(b)(1), unless the authorization is terminated or revoked sooner.  Performed at [facility], [address] [place], [place] [address]   Respiratory (~20 pathogens) panel by PCR     Status: None   Collection Time: 09/19/22  1:31 PM   Specimen: Nasopharyngeal Swab; Respiratory  Result Value Ref Range Status    Adenovirus NOT DETECTED NOT DETECTED Final   Coronavirus 229E NOT DETECTED NOT DETECTED Final    Comment: (NOTE) The Coronavirus on the Respiratory Panel, DOES NOT test for the novel  Coronavirus (2019 nCoV)    Coronavirus HKU1 NOT DETECTED NOT DETECTED Final   Coronavirus NL63 NOT DETECTED NOT DETECTED Final   Coronavirus OC43 NOT DETECTED NOT DETECTED Final   Metapneumovirus NOT DETECTED NOT DETECTED Final   Rhinovirus / Enterovirus NOT DETECTED NOT DETECTED Final   Influenza A NOT DETECTED NOT DETECTED Final   Influenza B NOT DETECTED NOT DETECTED Final   Parainfluenza Virus 1 NOT DETECTED NOT DETECTED Final   Parainfluenza Virus 2 NOT DETECTED NOT DETECTED Final   Parainfluenza Virus 3 NOT DETECTED NOT DETECTED Final   Parainfluenza Virus 4 NOT DETECTED NOT DETECTED Final   Respiratory Syncytial Virus NOT DETECTED NOT DETECTED Final   Bordetella pertussis NOT DETECTED NOT DETECTED Final   Bordetella Parapertussis NOT DETECTED NOT DETECTED Final   Chlamydophila pneumoniae NOT DETECTED NOT DETECTED Final   Mycoplasma pneumoniae NOT DETECTED NOT DETECTED Final    Comment: Performed at [facility], [address], [place], [place] [address]    Coagulation Studies: No results for input(s): "LABPROT", "INR" in the last 72 hours.  Urinalysis: No results for input(s): "COLORURINE", "LABSPEC", "PHURINE", "GLUCOSEU", "HGBUR", "BILIRUBINUR", "KETONESUR", "PROTEINUR", "UROBILINOGEN", "NITRITE", "LEUKOCYTESUR" in the last 72 hours.  Invalid input(s): "APPERANCEUR"     Imaging: CARDIAC CATHETERIZATION  Result Date: 09/19/2022 Conclusions: Normal left heart, right heart, and pulmonary artery pressures. Normal cardiac output/index. Recommendations: Findings suggest that worsening hypoxia and lung opacities are not due to cardiogenic edema.  Continue to hold diuretics. Further workup of non-cardiac causes of respiratory decompensation per internal medicine and pulmonology. Yvonne Kendall, MD [facility]    Medications:    sodium chloride 10 mL/hr at 09/21/22 0314   sodium chloride  piperacillin-tazobactam (ZOSYN)  IV 3.375 g (09/21/22 0812)    acidophilus  1 capsule Oral Daily   apixaban  2.5 mg Oral BID   cyanocobalamin  1,000 mcg Oral Daily   diltiazem  360 mg Oral Daily   doxycycline  100 mg Oral Q12H   feeding supplement  237 mL Oral BID BM   liver oil-zinc oxide   Topical TID   methylPREDNISolone (SOLU-MEDROL) injection  40 mg Intravenous Q24H   metoprolol succinate  100 mg Oral Daily   multivitamins with iron  1 tablet Oral Daily   pantoprazole  40 mg Oral Daily   polyethylene glycol  17 g Oral Daily   rosuvastatin  10 mg Oral Daily   senna-docusate  1 tablet Oral BID   sodium chloride flush  3 mL Intravenous Q12H   sodium chloride, sodium chloride, acetaminophen, albuterol, bisacodyl, dextromethorphan-guaiFENesin, melatonin, ondansetron (ZOFRAN) IV, simethicone, sodium chloride, sodium chloride flush, traMADol  Assessment/ Plan:  [name] is a 87 y.o.  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  Acute kidney injury with proteinuria and hematuria on baseline creatinine of 1.05 GFR > 60 on 07/02/22.  Secondary to ATN versus AIN from cefazolin. IV contrast exposure on 07/24/22. -Creatinine 4.38 today - Adequate urine output - No acute indication for dialysis at this time  2. Acute respiratory failure requiring HFNC. IV Furosemide drip. Ileus seen on KUB. CT chest suspicious  for pneumonia. Cardiology performed RHC and recommend holding diuresis.   - Diuretic remains held    3.  Acute on chronic diastolic heart failure. With LE edema  Ejection fraction 55 to 60% with grade 1 diastolic dysfunction.  4.  Anemia with renal failure:     Lab Results  Component Value Date   HGB 8.5 (L) 09/20/2022  Hgb slowly improving    LOS: 34   7/20/202411:37 AM

## 2022-09-22 DIAGNOSIS — J9601 Acute respiratory failure with hypoxia: Secondary | ICD-10-CM | POA: Diagnosis not present

## 2022-09-22 LAB — CBC
HCT: 25.7 % — ABNORMAL LOW (ref 39.0–52.0)
Hemoglobin: 8.1 g/dL — ABNORMAL LOW (ref 13.0–17.0)
MCH: 28.6 pg (ref 26.0–34.0)
MCHC: 31.5 g/dL (ref 30.0–36.0)
MCV: 90.8 fL (ref 80.0–100.0)
Platelets: 201 10*3/uL (ref 150–400)
RBC: 2.83 MIL/uL — ABNORMAL LOW (ref 4.22–5.81)
RDW: 15.3 % (ref 11.5–15.5)
WBC: 5.3 10*3/uL (ref 4.0–10.5)
nRBC: 0 % (ref 0.0–0.2)

## 2022-09-22 LAB — BASIC METABOLIC PANEL WITH GFR
Anion gap: 12 (ref 5–15)
BUN: 98 mg/dL — ABNORMAL HIGH (ref 8–23)
CO2: 27 mmol/L (ref 22–32)
Calcium: 8.8 mg/dL — ABNORMAL LOW (ref 8.9–10.3)
Chloride: 102 mmol/L (ref 98–111)
Creatinine, Ser: 4.31 mg/dL — ABNORMAL HIGH (ref 0.61–1.24)
GFR, Estimated: 13 mL/min — ABNORMAL LOW
Glucose, Bld: 128 mg/dL — ABNORMAL HIGH (ref 70–99)
Potassium: 3.9 mmol/L (ref 3.5–5.1)
Sodium: 141 mmol/L (ref 135–145)

## 2022-09-22 LAB — C-REACTIVE PROTEIN: CRP: 7.8 mg/dL — ABNORMAL HIGH

## 2022-09-22 MED ORDER — DOXYCYCLINE HYCLATE 100 MG PO TABS: 100.0000 mg | ORAL_TABLET | Freq: Two times a day (BID) | ORAL | Status: DC

## 2022-09-22 MED ORDER — SODIUM CHLORIDE 0.9 % IV SOLN
100.0000 mg | Freq: Two times a day (BID) | INTRAVENOUS | Status: DC
  Administered 2022-09-22 – 2022-09-25 (×6): 100 mg via INTRAVENOUS
  Filled 2022-09-22 (×6): qty 100

## 2022-09-22 NOTE — Progress Notes (Signed)
PULMONOLOGY         Date: 09/22/2022,   MRN# [id number] Brett Riley 01-28-1936     AdmissionWeight: 91.9 kg                 CurrentWeight: 78.4 kg  Referring provider: Dr End   CHIEF COMPLAINT:   Acute on chronic hypoxemic respiratory failure   HISTORY OF PRESENT ILLNESS   This is a 87 year old with a history of skin cancer, carotid disease, celiac artery stenosis, coronary disease status post cardiac cath with findings multivessel disease and recommendation for coronary artery bypass, sensorineural hearing loss, systolic CHF, dyslipidemia, nephrolithiasis, hip osteoarthritis, atrial fibrillation who came into the hospital 32 days ago for acute on chronic hypoxemic respiratory failure after having an admission in May to June 2024 for aortic ulcer repair which was complicated by sepsis and Staph aureus bacteremia as well as osteomyelitis of the spine.  Patient completed full course of antimicrobials in July 2024.  He was found to be hypoxemic and initially required BiPAP diuresed and had chest physiotherapy with improvement to 2 to 3 L nasal cannula.  Was also seen by cardiology at medical management diuresed approximately 6 L which helped, however while waiting for LTAC approval apparently progressively became more dyspneic and hypoxemic despite ongoing diuresis.  PCCM consultation was placed for increased O2 requirement with acute on chronic hypoxemic respiratory failure.  09/20/22- patient remains hypoxemic, this am able to eat breakfast with help from wife at bedside.  Inflammatory biomarkers show marked elevation, this generally is not seen with fluid imbalance.  Viral workup was negative however he may still have some viral LRTI that is not screened on our platform or other cause of inflammation.  There is definite bronchiectasis on CT chest with ground glass infiltrates with antero posterior gradient seen with ARDS vs acute exacerbation of ILD/bronchiectasis.  Plan to  restart systemic steroids with solumedrol today at 40mg  IV.   09/21/22- patient is improved today.  He is down to 5L/min.  He shares breathing is less labored.  He was able to sleep better due to less distress.  He may need Lung or Kidney biopsy to evaluate underlying connective tissue disease which may explain his chronic bronchiectatic lungs and possibly involvement of kidney as well. He seems to be responding to steroids.  Will keep dose of IV solumedrol at 40mg  daily for now.   09/22/22- patient is improved today, he's down to 4L/min.  He feels stronger but far from baseline.  He is willing to work with PT.  Stable CKD overnight without worsening.  He is mentating well, eating well, taking his breathing treatments as prescribed. Wife at bedside helping paitent. He had loose stools this am after receiving stool softner.   I have stopped doxycycline PO high risk for pill induced esophagitis while bedbound.  No bleeding except very scant epistaxis in am while on eliquis.  He is sleeping better. Solumedrol at 40IV today.  CXR in am ordered.    PAST MEDICAL HISTORY   Past Medical History:  Diagnosis Date   Cancer [facility])    skin cancer basal   Carotid arterial disease (HCC)    a. 02/2016 L CEA; b. 01/2017 U/S: patent LICA, 1-39% RICA.   Celiac artery stenosis (HCC)    Coronary artery disease    a. 01/2016 MV: mild apical/basal inferior, apical lateral, mid anterolateral, and mid inferolateral ischemia. EF 57%; b. 02/2016 Cath: LM 40ost, LAD 70p/m, 20d, D1 95 (small),  LCX nl, RCA 49m, RPDA 90 (small), EF 55-65%-->med Rx. Rec CABG for recurrent symptoms.   Hearing loss    History of echocardiogram    a. 02/2016 Echo: EF 50-55%, no rwma, mild MR.   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   Osteoarthritis, hip, bilateral    Left > Right     SURGICAL HISTORY   Past Surgical History:  Procedure Laterality Date    AORTIC INTERVENTION N/A 07/24/2022   Procedure: AORTIC INTERVENTION;  Surgeon: Renford Dills, MD;  Location: [facility] INVASIVE CV LAB;  Service: Cardiovascular;  Laterality: N/A;   CARDIAC CATHETERIZATION N/A 01/19/2016   Procedure: Left Heart Cath and Coronary Angiography;  Surgeon: Iran Ouch, MD;  Location: [facility] INVASIVE CV LAB;  Service: Cardiovascular;  Laterality: N/A;   COLONOSCOPY     CYSTOSCOPY/URETEROSCOPY/HOLMIUM LASER/STENT PLACEMENT Right 04/13/2021   Procedure: CYSTOSCOPY/URETEROSCOPY/HOLMIUM LASER/STENT PLACEMENT;  Surgeon: Sondra Come, MD;  Location: [facility] ORS;  Service: Urology;  Laterality: Right;   ENDARTERECTOMY Left 02/15/2016   Procedure: ENDARTERECTOMY CAROTID;  Surgeon: Annice Needy, MD;  Location: [facility] ORS;  Service: Vascular;  Laterality: Left;   RIGHT HEART CATH N/A 09/19/2022   Procedure: RIGHT HEART CATH;  Surgeon: Yvonne Kendall, MD;  Location: [facility] INVASIVE CV LAB;  Service: Cardiovascular;  Laterality: N/A;   TEE WITHOUT CARDIOVERSION N/A 08/05/2022   Procedure: TRANSESOPHAGEAL ECHOCARDIOGRAM;  Surgeon: Debbe Odea, MD;  Location: [facility] ORS;  Service: Cardiovascular;  Laterality: N/A;     FAMILY HISTORY   Family History  Problem Relation Age of Onset   Stroke Mother    Heart disease Father        MI   Heart attack Father    Breast cancer Sister    Colon cancer Neg Hx    Prostate cancer Neg Hx      SOCIAL HISTORY   Social History   Tobacco Use   Smoking status: Never   Smokeless tobacco: Never  Vaping Use   Vaping status: Never Used  Substance Use Topics   Alcohol use: No    Alcohol/week: 0.0 standard drinks of alcohol   Drug use: No     MEDICATIONS    Home Medication:  Current Outpatient Rx   Order #: [id number] Class: Normal   Order #: [id number] Class: Normal   Order #: [id number] Class: Normal    Current Medication:  Current Facility-Administered Medications:    0.9 %  sodium chloride infusion, , Intravenous, PRN,  End, Christopher, MD, Last Rate: 10 mL/hr at 09/22/22 0450, Infusion Verify at 09/22/22 0450   0.9 %  sodium chloride infusion, 250 mL, Intravenous, PRN, End, Cristal Deer, MD   acetaminophen (TYLENOL) tablet 650 mg, 650 mg, Oral, Q6H PRN, End, Christopher, MD, 650 mg at 09/13/22 1713   acidophilus (RISAQUAD) capsule 1 capsule, 1 capsule, Oral, Daily, End, Christopher, MD, 1 capsule at 09/21/22 1011   albuterol (PROVENTIL) (2.5 MG/3ML) 0.083% nebulizer solution 2.5 mg, 2.5 mg, Nebulization, Q4H PRN, End, Christopher, MD, 2.5 mg at 09/16/22 0739   apixaban (ELIQUIS) tablet 2.5 mg, 2.5 mg, Oral, BID, End, Christopher, MD, 2.5 mg at 09/21/22 2122   bisacodyl (DULCOLAX) suppository 10 mg, 10 mg, Rectal, Daily PRN, End, Cristal Deer, MD   cyanocobalamin (VITAMIN B12) tablet 1,000 mcg, 1,000 mcg, Oral, Daily, End, Christopher, MD, 1,000 mcg at 09/21/22 1011   dextromethorphan-guaiFENesin (MUCINEX DM) 30-600 MG per 12 hr tablet 1 tablet, 1  tablet, Oral, BID PRN, End, Christopher, MD, 1 tablet at 09/21/22 2122   diltiazem (CARDIZEM CD) 24 hr capsule 360 mg, 360 mg, Oral, Daily, End, Christopher, MD, 360 mg at 09/21/22 1011   doxycycline (VIBRA-TABS) tablet 100 mg, 100 mg, Oral, Q12H, End, Christopher, MD, 100 mg at 09/21/22 2122   feeding supplement (ENSURE ENLIVE / ENSURE PLUS) liquid 237 mL, 237 mL, Oral, BID BM, End, Christopher, MD, 237 mL at 09/21/22 1029   liver oil-zinc oxide (DESITIN) 40 % ointment, , Topical, TID, End, Christopher, MD, Given at 09/21/22 2123   melatonin tablet 2.5 mg, 2.5 mg, Oral, QHS PRN, End, Christopher, MD, 2.5 mg at 09/21/22 2122   methylPREDNISolone sodium succinate (SOLU-MEDROL) 40 mg/mL injection 40 mg, 40 mg, Intravenous, Q24H, Zaliyah Meikle, MD, 40 mg at 09/21/22 1012   metoprolol succinate (TOPROL-XL) 24 hr tablet 100 mg, 100 mg, Oral, Daily, End, Christopher, MD, 100 mg at 09/21/22 1011   multivitamins with iron tablet 1 tablet, 1 tablet, Oral, Daily, End, Christopher,  MD, 1 tablet at 09/21/22 1013   ondansetron (ZOFRAN) injection 4 mg, 4 mg, Intravenous, Q8H PRN, End, Christopher, MD, 4 mg at 09/17/22 1754   pantoprazole (PROTONIX) EC tablet 40 mg, 40 mg, Oral, Daily, End, Christopher, MD, 40 mg at 09/21/22 1011   piperacillin-tazobactam (ZOSYN) IVPB 3.375 g, 3.375 g, Intravenous, Q12H, Wouk, Wilfred Curtis, MD, Last Rate: 12.5 mL/hr at 09/22/22 0746, 3.375 g at 09/22/22 0746   polyethylene glycol (MIRALAX / GLYCOLAX) packet 17 g, 17 g, Oral, Daily, End, Christopher, MD, 17 g at 09/18/22 1037   rosuvastatin (CRESTOR) tablet 10 mg, 10 mg, Oral, Daily, End, Christopher, MD, 10 mg at 09/21/22 1011   senna-docusate (Senokot-S) tablet 1 tablet, 1 tablet, Oral, BID, End, Christopher, MD, 1 tablet at 09/21/22 2122   simethicone (MYLICON) chewable tablet 80 mg, 80 mg, Oral, QID PRN, End, Christopher, MD, 80 mg at 08/20/22 2121   sodium chloride (OCEAN) 0.65 % nasal spray 1 spray, 1 spray, Each Nare, PRN, End, Christopher, MD, 1 spray at 08/27/22 0319   sodium chloride flush (NS) 0.9 % injection 3 mL, 3 mL, Intravenous, Q12H, End, Christopher, MD, 3 mL at 09/21/22 2123   sodium chloride flush (NS) 0.9 % injection 3 mL, 3 mL, Intravenous, PRN, End, Cristal Deer, MD   traMADol Janean Sark) tablet 50 mg, 50 mg, Oral, Q6H PRN, End, Christopher, MD, 50 mg at 09/21/22 2122    ALLERGIES   Patient has no known allergies.     REVIEW OF SYSTEMS    Review of Systems:  Gen:  Denies  fever, sweats, chills weigh loss  HEENT: Denies blurred vision, double vision, ear pain, eye pain, hearing loss, nose bleeds, sore throat Cardiac:  No dizziness, chest pain or heaviness, chest tightness,edema Resp:   reports dyspnea chronically  Gi: Denies swallowing difficulty, stomach pain, nausea or vomiting, diarrhea, constipation, bowel incontinence Gu:  Denies bladder incontinence, burning urine Ext:   Denies Joint pain, stiffness or swelling Skin: Denies  skin rash, easy bruising or  bleeding or hives Endoc:  Denies polyuria, polydipsia , polyphagia or weight change Psych:   Denies depression, insomnia or hallucinations   Other:  All other systems negative   VS: BP 131/66 (BP Location: Left Arm)   Pulse 65   Temp 97.6 F (36.4 C)   Resp 19   Ht 6' (1.829 m)   Wt 78.4 kg   SpO2 92%   BMI 23.44 kg/m      PHYSICAL  EXAM    GENERAL:NAD, no fevers, chills, no weakness no fatigue HEAD: Normocephalic, atraumatic.  EYES: Pupils equal, round, reactive to light. Extraocular muscles intact. No scleral icterus.  MOUTH: Moist mucosal membrane. Dentition intact. No abscess noted.  EAR, NOSE, THROAT: Clear without exudates. No external lesions.  NECK: Supple. No thyromegaly. No nodules. No JVD.  PULMONARY: decreased breath sounds with mild rhonchi worse at bases bilaterally.  CARDIOVASCULAR: S1 and S2. Regular rate and rhythm. No murmurs, rubs, or gallops. No edema. Pedal pulses 2+ bilaterally.  GASTROINTESTINAL: Soft, nontender, nondistended. No masses. Positive bowel sounds. No hepatosplenomegaly.  MUSCULOSKELETAL: No swelling, clubbing, or edema. Range of motion full in all extremities.  NEUROLOGIC: Cranial nerves II through XII are intact. No gross focal neurological deficits. Sensation intact. Reflexes intact.  SKIN: No ulceration, lesions, rashes, or cyanosis. Skin warm and dry. Turgor intact.  PSYCHIATRIC: Mood, affect within normal limits. The patient is awake, alert and oriented x 3. Insight, judgment intact.       IMAGING     ASSESSMENT/PLAN    Acute on chronic hypoxemic respiratory failure - compared to previous CT chest 09/05/22 infiltrates have worsened with interstitial infiltrates bilaterally and bronchiectasis in the background.  -CRP and ESR severely elevated indicative of underlying active systemic process.   Viral workup thus far negative, starting steroids via IV today. -s/p RHC - appreciate cardiology - right heart metrics are within  reference range without PH -COVID and RVP reswabbing- negative  -ON  zosyn and bactrim  -there is no leukocytosis and patient does not appear to be septic clinically, consider consultation with ID to stop antimicrobials to help preserve renal function/Cdiff devt -previous serology for autoimmune disease was negative but he may need biopsy of lung/kidney for evaluation of vasculitis or connective tissue etiology      Thank you for allowing me to participate in the care of this patient.   Patient/Family are satisfied with care plan and all questions have been answered.    Provider disclosure: Patient with at least one acute or chronic illness or injury that poses a threat to life or bodily function and is being managed actively during this encounter.  All of the below services have been performed independently by signing provider:  review of prior documentation from internal and or external health records.  Review of previous and current lab results.  Interview and comprehensive assessment during patient visit today. Review of current and previous chest radiographs/CT scans. Discussion of management and test interpretation with health care team and patient/family.   This document was prepared using Dragon voice recognition software and may include unintentional dictation errors.     Vida Rigger, M.D.  Division of Pulmonary & Critical Care Medicine

## 2022-09-22 NOTE — Progress Notes (Addendum)
PROGRESS NOTE    Brett Riley  HQI:[id number] DOB: June 08, 1935 DOA: 08/18/2022 PCP: Dale Fuig, MD    Brief Narrative:  87 y.o. male with medical history significant of chronic dCHF, HTN, HLD, CAD, CKD-3a, anemia, chronic A fib on Eliquis, who presents 08/18/2022 to ED from rehab, chief complaint SOB.  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 Net IO Since Admission: -5,549.14 mL [08/20/22 1608]. Heparin stopped d/t rectal bleeding overnight.  06/19: euvolemic per cardio, d/c lasix, O2 requirement still high, CT chest reviewed, ?edema/atypical infection, given clinically worsening respiratory status have asked pulmonary and ID to consult. Have d/c amiodarone and ordered steroids in case amio toxicity.  06/20: ID and pulmonary assisting with the management.   6/22-6/25: patient feels clinically improved. Stable with oxygen via [place] and weaned down to 2-3L. Patient requiring judicial adjustments to balance between hypervolemia and renal injury with diuresis. Unable to pull fluid from lungs with thoracentesis so medical management is best option. His IV Abx to treat his vertebral osteomyelitis from previous admission continue unchanged and managed by ID.    6/28: Bed offered at [facility].  Authorization pending 7/1: No status changes.  Per patient's daughter he has been seeming more depressed.  I have offered outpatient referral to psychiatry.   7/2: Insurance authorization for LTAC continues to be pending   7/3 : Peer-to-peer review done.  Not a candidate for LTAC.   Approved for skilled nursing facility patient   7/9 : Medically stable and ready for discharge to skilled nursing facility 7/10: No acute events.  Pending insurance authorization  7/15: Acute hypoxia noted this morning.  Placed on high flow nasal cannula.  Pulmonary edema noted on check chest x-ray.  Lasix restarted.  7/16: Further deterioration of respiratory status.  Required initiation of BiPAP initiation of Lasix gtt.   Assessment & Plan:   Principal Problem:   Acute respiratory failure with hypoxia (HCC) Active Problems:   Acute on chronic heart failure with preserved ejection fraction (HFpEF) (HCC)   MSSA bacteremia   Vertebral osteomyelitis (HCC)   CAD (coronary artery disease)   Myocardial injury   Hypertension   Hyperlipidemia   Chronic kidney disease, stage 3a (HCC)   Normocytic anemia   Atrial fibrillation, chronic (HCC)   PVD (peripheral vascular disease) (HCC)   Paroxysmal atrial fibrillation (HCC)   HCAP (healthcare-associated pneumonia)   Sepsis (HCC)   Pleural effusion   Amiodarone pulmonary toxicity   Acute on chronic HFrEF (heart failure with reduced ejection fraction) (HCC)   Pleural effusion on right   Status post thoracentesis   Heart failure with preserved ejection fraction (HCC)   Stage 3 chronic kidney disease (HCC)  Acute on chronic respiratory failure with hypoxia  Was previously on room air Saturation acutely worsened on 7/15 Thought to be chf and treated with lasix gtt. RHC on 7/18 showed normal filling pressures, this does not appear to be CHF. Inflammatory markers quite elevated, viral panel negative. ABX have been broadened to zosyn Now off lasix Plan: Abx broadened to zosyn on 7/17, continuing those now, favor completing a 5-day  course (I.e. 1 more day) Inflammatory process likely driving things, pulmonology re-consulted on 7/18 and have started IV steroids on 7/19, continue those as likely the most helpful intervention at this  point Weaning O2, down to 4 liters today (10>9>5>4  Normocytic anemia Thrombocytopenia Hgb stable around 8 and platelets now stable around 150 - monitor for now  AKI on Chronic kidney disease, stage 3a (HCC): Uop adequate, gfr pretty stable 10-15. Looks like ATN. Plan: Nephrology following now hopeful for renal recovery, no indication for dialysis at the present  diastolic CHF-  Diuresis on hold as above   Pleural effusion IR unable to perform thoracentesis 6/20, 6/21. Effusion now resolved  Atrial fibrillation, chronic (HCC) Heart rate controlled.  On diltiazem and metoprolol per cardiology.  On Eliquis.  Monitor for bleeding.   Recent hx of MSSA bacteremia vertebral osteomyelitis (HCC): Received Ancef until 7/11.  This was stopped and doxycycline 100 mg p.o. twice daily x 6 weeks started per ID recommendations (I.e. through 8/22).  PICC line removed 7/12. Will switch doxy to IV today given pulm concern about esophagitis given patient mainly bedbound currently  Recent penetrating atherosclerotic ulcer Repaired by vascular surgery 07/2022   CAD  Myocardial injury  HLD:  Evidence of ACS Continue Crestor, beta-blocker, Eliquis No plans for inpatient ischemic evaluation    GERD PPI  Ileus resolved  DVT prophylaxis: Eliquis Code Status: DNR Family Communication: daughter and wife at bedside 7/21 Disposition Plan: Status is: Inpatient Remains inpatient appropriate because: severe illness   Level of care: Progressive  Consultants:  Nephrology, pulmonology, cardiology  Procedures:  Right heart cath 7/18  Antimicrobials: Doxycycline zosyn   Subjective: Breathing stable. Tolerating diet. Having BMs.   Objective: Vitals:   09/22/22 0513 09/22/22 0513 09/22/22 0944 09/22/22 1238  BP:  131/66 (!) 143/72 (!) 139/93  Pulse:  65 65 66  Resp:  19 16 16   Temp:  97.6 F (36.4 C) 97.6 F (36.4 C) (!) 97.4 F (36.3 C)  TempSrc:      SpO2:  92% 97% 95%  Weight:  78.4 kg     Height:        Intake/Output Summary (Last 24 hours) at 09/22/2022 1438 Last data filed at 09/22/2022 0900 Gross per 24 hour  Intake 50 ml  Output 950 ml  Net -900 ml   Filed Weights   09/19/22 1339 09/21/22 0707 09/22/22 0513  Weight: 80.9 kg 76 kg 78.4 kg    Examination:  General exam: Appears fatigued Respiratory system: Scattered rales  bilaterally.  Normal work of breathing.  4 L Cardiovascular system: S1-S2, RRR, no murmurs, 2+ pedal edema Gastrointestinal system: Soft, NT/ND, normal bowel sounds Central nervous system: Alert and oriented. No focal neurological deficits. Extremities: mild LE edema Skin: No rashes, lesions or ulcers Psychiatry: Judgement and insight appear normal. Mood & affect appropriate.     Data Reviewed: I have personally reviewed following labs and imaging studies  CBC: Recent Labs  Lab 09/16/22 0814 09/17/22 0924 09/18/22 1436 09/19/22 0630 09/20/22 0914 09/22/22 0629  WBC 7.9 5.9 6.4 6.4 5.5 5.3  NEUTROABS 4.7 3.9  --   --   --   --   HGB 8.3* 7.8* 8.0* 8.3* 8.5* 8.1*  HCT 25.7* 25.6* 25.2* 25.9* 27.4* 25.7*  MCV 91.5 97.0 92.6 90.9 92.3 90.8  PLT 141* 130* 152 165 174 201   Basic Metabolic Panel: Recent Labs  Lab 09/18/22 1436 09/19/22 0630 09/20/22 0914 09/21/22 0521 09/22/22 0629  NA 140  141 143 143 141  K 3.5 3.5 3.1* 4.1 3.9  CL 103 101 99 102 102  CO2 26 26 28 28 27   GLUCOSE 110* 106* 124* 122* 128*  BUN 95* 96* 99* 102* 98*  CREATININE 3.89* 3.93* 4.25* 4.38* 4.31*  CALCIUM 8.5* 8.8* 8.9 8.7* 8.8*  MG  --   --  1.9  --   --    GFR: Estimated Creatinine Clearance: 14 mL/min (A) (by C-G formula based on SCr of 4.31 mg/dL (H)). Liver Function Tests: Recent Labs  Lab 09/18/22 1436  ALBUMIN 2.5*   No results for input(s): "LIPASE", "AMYLASE" in the last 168 hours. No results for input(s): "AMMONIA" in the last 168 hours. Coagulation Profile: No results for input(s): "INR", "PROTIME" in the last 168  hours. Cardiac Enzymes: No results for input(s): "CKTOTAL", "CKMB", "CKMBINDEX", "TROPONINI" in the last 168 hours. BNP (last 3 results) No results for input(s): "PROBNP" in the last 8760 hours. HbA1C: No results for input(s): "HGBA1C" in the last 72 hours. CBG: No results for input(s): "GLUCAP" in the last 168 hours. Lipid Profile: No results for input(s): "CHOL", "HDL", "LDLCALC", "TRIG", "CHOLHDL", "LDLDIRECT" in the last 72 hours. Thyroid Function Tests: No results for input(s): "TSH", "T4TOTAL", "FREET4", "T3FREE", "THYROIDAB" in the last 72 hours. Anemia Panel: No results for input(s): "VITAMINB12", "FOLATE", "FERRITIN", "TIBC", "IRON", "RETICCTPCT" in the last 72 hours. Sepsis Labs: No results for input(s): "PROCALCITON", "LATICACIDVEN" in the last 168 hours.  Recent Results (from the past 240 hour(s))  SARS Coronavirus 2 by RT PCR (hospital order, performed in [facility] hospital lab) *cepheid single result test* Anterior Nasal Swab     Status: None   Collection Time: 09/19/22  1:31 PM   Specimen: Anterior Nasal Swab  Result Value Ref Range Status   SARS Coronavirus 2 by RT PCR NEGATIVE NEGATIVE Final    Comment: (NOTE) SARS-CoV-2 target nucleic acids are NOT DETECTED.  The SARS-CoV-2 RNA is generally detectable in upper and lower respiratory specimens during the acute phase of infection. The lowest concentration of SARS-CoV-2 viral copies this assay can detect is 250 copies / mL. A negative result does not preclude SARS-CoV-2 infection and should not be used as the sole basis for treatment or other patient management decisions.  A negative result may occur with improper specimen collection / handling, submission of specimen other than nasopharyngeal swab, presence of viral mutation(s) within the areas targeted by this assay, and inadequate number of viral copies (<250 copies / mL). A negative result must be combined with clinical observations, patient history, and  epidemiological information.  Fact Sheet for Patients:   RoadLapTop.co.za  Fact Sheet for Healthcare Providers: http://kim-miller.com/  This test is not yet approved or  cleared by the [place] FDA and has been authorized for detection and/or diagnosis of SARS-CoV-2 by FDA under an Emergency Use Authorization (EUA).  This EUA will remain in effect (meaning this test can be used) for the duration of the COVID-19 declaration under Section 564(b)(1) of the Act, 21 U.S.C. section 360bbb-3(b)(1), unless the authorization is terminated or revoked sooner.  Performed at [facility], [address] [place], [place] [address]   Respiratory (~20 pathogens) panel by PCR     Status: None   Collection Time: 09/19/22  1:31 PM   Specimen: Nasopharyngeal Swab; Respiratory  Result Value Ref Range Status   Adenovirus NOT DETECTED NOT DETECTED Final   Coronavirus 229E NOT DETECTED NOT DETECTED Final    Comment: (NOTE) The  Coronavirus on the Respiratory Panel, DOES NOT test for the novel  Coronavirus (2019 nCoV)    Coronavirus HKU1 NOT DETECTED NOT DETECTED Final   Coronavirus NL63 NOT DETECTED NOT DETECTED Final   Coronavirus OC43 NOT DETECTED NOT DETECTED Final   Metapneumovirus NOT DETECTED NOT DETECTED Final   Rhinovirus / Enterovirus NOT DETECTED NOT DETECTED Final   Influenza A NOT DETECTED NOT DETECTED Final   Influenza B NOT DETECTED NOT DETECTED Final   Parainfluenza Virus 1 NOT DETECTED NOT DETECTED Final   Parainfluenza Virus 2 NOT DETECTED NOT DETECTED Final   Parainfluenza Virus 3 NOT DETECTED NOT DETECTED Final   Parainfluenza Virus 4 NOT DETECTED NOT DETECTED Final   Respiratory Syncytial Virus NOT DETECTED NOT DETECTED Final   Bordetella pertussis NOT DETECTED NOT DETECTED Final   Bordetella Parapertussis NOT DETECTED NOT DETECTED Final   Chlamydophila pneumoniae NOT DETECTED NOT DETECTED Final   Mycoplasma pneumoniae  NOT DETECTED NOT DETECTED Final    Comment: Performed at [facility], [address] [place], [place] [address]         Radiology Studies: No results found.      Scheduled Meds:  acidophilus  1 capsule Oral Daily   apixaban  2.5 mg Oral BID   cyanocobalamin  1,000 mcg Oral Daily   diltiazem  360 mg Oral Daily   doxycycline  100 mg Oral Q12H   feeding supplement  237 mL Oral BID BM   liver oil-zinc oxide   Topical TID   methylPREDNISolone (SOLU-MEDROL) injection  40 mg Intravenous Q24H   metoprolol succinate  100 mg Oral Daily   multivitamins with iron  1 tablet Oral Daily   pantoprazole  40 mg Oral Daily   polyethylene glycol  17 g Oral Daily   rosuvastatin  10 mg Oral Daily   senna-docusate  1 tablet Oral BID   sodium chloride flush  3 mL Intravenous Q12H   Continuous Infusions:  sodium chloride 10 mL/hr at 09/22/22 0450   sodium chloride     piperacillin-tazobactam (ZOSYN)  IV 3.375 g (09/22/22 0746)     LOS: 35 days     Silvano Bilis, MD [facility]   If 7PM-7AM, please contact night-coverage  09/22/2022, 2:38 PM

## 2022-09-23 ENCOUNTER — Inpatient Hospital Stay: Payer: Medicare HMO

## 2022-09-23 DIAGNOSIS — J9601 Acute respiratory failure with hypoxia: Secondary | ICD-10-CM | POA: Diagnosis not present

## 2022-09-23 DIAGNOSIS — Z7189 Other specified counseling: Secondary | ICD-10-CM | POA: Diagnosis not present

## 2022-09-23 LAB — BASIC METABOLIC PANEL WITH GFR
Anion gap: 15 (ref 5–15)
BUN: 102 mg/dL — ABNORMAL HIGH (ref 8–23)
CO2: 25 mmol/L (ref 22–32)
Calcium: 8.7 mg/dL — ABNORMAL LOW (ref 8.9–10.3)
Chloride: 101 mmol/L (ref 98–111)
Creatinine, Ser: 4.12 mg/dL — ABNORMAL HIGH (ref 0.61–1.24)
GFR, Estimated: 14 mL/min — ABNORMAL LOW
Glucose, Bld: 126 mg/dL — ABNORMAL HIGH (ref 70–99)
Potassium: 3.9 mmol/L (ref 3.5–5.1)
Sodium: 141 mmol/L (ref 135–145)

## 2022-09-23 LAB — POCT I-STAT EG7
Acid-Base Excess: 4 mmol/L — ABNORMAL HIGH (ref 0.0–2.0)
Bicarbonate: 28.5 mmol/L — ABNORMAL HIGH (ref 20.0–28.0)
Calcium, Ion: 1.15 mmol/L (ref 1.15–1.40)
HCT: 24 % — ABNORMAL LOW (ref 39.0–52.0)
Hemoglobin: 8.2 g/dL — ABNORMAL LOW (ref 13.0–17.0)
O2 Saturation: 63 %
Potassium: 3.2 mmol/L — ABNORMAL LOW (ref 3.5–5.1)
Sodium: 141 mmol/L (ref 135–145)
TCO2: 30 mmol/L (ref 22–32)
pCO2, Ven: 41.3 mmHg — ABNORMAL LOW (ref 44–60)
pH, Ven: 7.448 — ABNORMAL HIGH (ref 7.25–7.43)
pO2, Ven: 31 mmHg — CL (ref 32–45)

## 2022-09-23 MED ORDER — PREDNISONE 50 MG PO TABS: 35.0000 mg | ORAL_TABLET | Freq: Every day | ORAL | Status: DC

## 2022-09-23 MED ORDER — PREDNISONE 50 MG PO TABS: 50.0000 mg | ORAL_TABLET | Freq: Every day | ORAL | Status: DC

## 2022-09-23 MED ORDER — PREDNISONE 10 MG PO TABS: 15.0000 mg | ORAL_TABLET | Freq: Every day | ORAL | Status: DC

## 2022-09-23 MED ORDER — PREDNISONE 10 MG PO TABS: 5.0000 mg | ORAL_TABLET | Freq: Every day | ORAL | Status: DC

## 2022-09-23 MED ORDER — PREDNISONE 50 MG PO TABS: 45.0000 mg | ORAL_TABLET | Freq: Every day | ORAL | Status: DC

## 2022-09-23 MED ORDER — PREDNISONE 50 MG PO TABS: 25.0000 mg | ORAL_TABLET | Freq: Every day | ORAL | Status: DC

## 2022-09-23 MED ORDER — PREDNISONE 10 MG PO TABS: 10.0000 mg | ORAL_TABLET | Freq: Every day | ORAL | Status: DC

## 2022-09-23 MED ORDER — PREDNISONE 20 MG PO TABS: 20.0000 mg | ORAL_TABLET | Freq: Every day | ORAL | Status: DC

## 2022-09-23 MED ORDER — PREDNISONE 20 MG PO TABS: 40.0000 mg | ORAL_TABLET | Freq: Every day | ORAL | Status: DC

## 2022-09-23 MED ORDER — PREDNISONE 50 MG PO TABS
50.0000 mg | ORAL_TABLET | Freq: Every day | ORAL | Status: DC
  Administered 2022-09-24 – 2022-09-25 (×2): 50 mg via ORAL
  Filled 2022-09-23 (×2): qty 1

## 2022-09-23 MED ORDER — PREDNISONE 20 MG PO TABS: 30.0000 mg | ORAL_TABLET | Freq: Every day | ORAL | Status: DC

## 2022-09-23 NOTE — Progress Notes (Addendum)
PROGRESS NOTE    Brett PRABHAKAR  FIE:[id number] DOB: [birth year] DOA: 08/18/2022 PCP: Dale Nesquehoning, MD    Brief Narrative:  87 y.o. male with medical history significant of chronic dCHF, HTN, HLD, CAD, CKD-3a, anemia, chronic A fib on Eliquis, who presents 08/18/2022 to ED from rehab, chief complaint SOB.  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 Net IO Since Admission: -5,549.14 mL [08/20/22 1608]. Heparin stopped d/t rectal bleeding overnight.  06/19: euvolemic per cardio, d/c lasix, O2 requirement still high, CT chest reviewed, ?edema/atypical infection, given clinically worsening respiratory status have asked pulmonary and ID to consult. Have d/c amiodarone and ordered steroids in case amio toxicity.  06/20: ID and pulmonary assisting with the management.   6/22-6/25: patient feels clinically improved. Stable with oxygen via [place] and weaned down to 2-3L. Patient requiring judicial adjustments to balance between hypervolemia and renal injury with diuresis. Unable to pull fluid from lungs with thoracentesis so medical management is best option. His IV Abx to treat his vertebral osteomyelitis from previous admission continue unchanged and managed by ID.    6/28: Bed offered at [facility].  Authorization pending 7/1: No status changes.  Per patient's daughter he has been seeming more depressed.  I have offered outpatient referral to psychiatry.   7/2: Insurance authorization for LTAC continues to be pending   7/3 : Peer-to-peer review done.  Not a candidate for LTAC.   Approved for skilled nursing facility patient   7/9 : Medically stable and ready for discharge to skilled nursing facility 7/10: No acute events.  Pending insurance authorization  7/15: Acute hypoxia noted this morning.  Placed on high flow nasal cannula.  Pulmonary edema noted on check chest x-ray.  Lasix restarted.  7/16: Further deterioration of respiratory status.  Required initiation of BiPAP initiation of Lasix gtt.   Assessment & Plan:   Principal Problem:   Acute respiratory failure with hypoxia (HCC) Active Problems:   Acute on chronic heart failure with preserved ejection fraction (HFpEF) (HCC)   MSSA bacteremia   Vertebral osteomyelitis (HCC)   CAD (coronary artery disease)   Myocardial injury   Hypertension   Hyperlipidemia   Chronic kidney disease, stage 3a (HCC)   Normocytic anemia   Atrial fibrillation, chronic (HCC)   PVD (peripheral vascular disease) (HCC)   Paroxysmal atrial fibrillation (HCC)   HCAP (healthcare-associated pneumonia)   Sepsis (HCC)   Pleural effusion   Amiodarone pulmonary toxicity   Acute on chronic HFrEF (heart failure with reduced ejection fraction) (HCC)   Pleural effusion on right   Status post thoracentesis   Heart failure with preserved ejection fraction (HCC)   Stage 3 chronic kidney disease (HCC)  Acute on chronic respiratory failure with hypoxia  Was previously on room air Saturation acutely worsened on 7/15 Thought to be chf and treated with lasix gtt. RHC on 7/18 showed normal filling pressures, this does not appear to be CHF. Inflammatory markers quite elevated, viral panel negative. ABX have been broadened to zosyn Now off lasix Plan: Abx broadened to zosyn on 7/17, and is now s/p 5 days treatment.  Inflammatory process likely driving things, pulmonology re-consulted on 7/18 and have started IV steroids on 7/19, continue those as likely the most helpful intervention at this point Weaning O2, down to 3 liters today  (10>9>5>4>3 Will discuss steroid weaning w/ pulm  Debility Per toc SNF doesn't have bed tomorrow, maybe Wednesday  Normocytic anemia Thrombocytopenia Hgb stable around 8 and platelets now stable around 150 - monitor for now  AKI on Chronic kidney disease, stage 3a (HCC): Uop adequate, gfr pretty stable 10-15. Looks like ATN. Plan: Nephrology following now hopeful for renal recovery, no indication for dialysis at the present. Per them if cr continues its down-trend tomorrow ok to d/c then  diastolic CHF-  Diuresis on hold as above   Pleural effusion IR unable to perform thoracentesis 6/20, 6/21. Effusion now resolved  Atrial fibrillation, chronic (HCC) Heart rate controlled.  On diltiazem and metoprolol per cardiology.  On Eliquis.  Monitor for bleeding. Per cardiology f/u with them in a few weeks   Recent hx of MSSA bacteremia vertebral osteomyelitis (HCC): Received Ancef until 7/11.  This was stopped and doxycycline 100 mg p.o. twice daily x 6 weeks started per ID recommendations (I.e. through 8/22).  PICC line removed 7/12. Cont doxy to IV given pulm concern about esophagitis given patient mainly bedbound currently, though he has been able to sit up at side of bed so think oral will be ok at discharge with appropriate precautions  Recent penetrating atherosclerotic ulcer Repaired by vascular surgery 07/2022   CAD  Myocardial injury  HLD:  Evidence of ACS Continue Crestor, beta-blocker, Eliquis No plans for inpatient ischemic evaluation    GERD PPI  Ileus resolved  DVT prophylaxis: Eliquis Code Status: DNR Family Communication:  wife at bedside 7/22 Disposition Plan: Status is: Inpatient Remains inpatient appropriate because: severe illness   Level of care: Progressive  Consultants:  Nephrology, pulmonology, cardiology  Procedures:  Right heart cath 7/18  Antimicrobials: Doxycycline zosyn   Subjective: Breathing stable. Tolerating diet. Having BMs.    Objective: Vitals:   09/23/22 0451 09/23/22 0711 09/23/22 0830 09/23/22 1111  BP: 131/70  139/75 (!) 146/79  Pulse: 64  64 64  Resp: 19  20 16   Temp: 97.6 F (36.4 C)  97.8 F (36.6 C) 97.7 F (36.5 C)  TempSrc:   Oral Oral  SpO2: 94%  97% 96%  Weight:  79.7 kg    Height:        Intake/Output Summary (Last 24 hours) at 09/23/2022 1353 Last data filed at 09/23/2022 1012 Gross per 24 hour  Intake 480 ml  Output 1450 ml  Net -970 ml   Filed Weights   09/21/22 0707 09/22/22 0513 09/23/22 0711  Weight: 76 kg 78.4 kg 79.7 kg    Examination:  General exam: Appears fatigued Respiratory system: Scattered rales  bilaterally.  Normal work of breathing.  4 L Cardiovascular system: S1-S2, RRR, no murmurs, 2+ pedal edema Gastrointestinal system: Soft, NT/ND, normal bowel sounds Central nervous system: Alert and oriented. No focal neurological deficits. Extremities: mild LE edema Skin: No rashes, lesions or ulcers Psychiatry: Judgement and insight appear normal. Mood & affect appropriate.     Data Reviewed: I have personally reviewed following labs and imaging studies  CBC: Recent Labs  Lab 09/17/22 0924 09/18/22 1436 09/19/22 0630 09/19/22 1659 09/20/22 0914 09/22/22 0629  WBC 5.9 6.4 6.4  --  5.5 5.3  NEUTROABS 3.9  --   --   --   --   --  HGB 7.8* 8.0* 8.3* 8.2* 8.5* 8.1*  HCT 25.6* 25.2* 25.9* 24.0* 27.4* 25.7*  MCV 97.0 92.6 90.9  --  92.3 90.8  PLT 130* 152 165  --  174 201   Basic Metabolic Panel: Recent Labs  Lab 09/19/22 0630 09/19/22 1659 09/20/22 0914 09/21/22 0521 09/22/22 0629 09/23/22 0446  NA 141 141 143 143 141 141  K 3.5 3.2* 3.1* 4.1 3.9 3.9  CL 101  --  99 102 102 101  CO2 26  --  28 28 27 25   GLUCOSE 106*  --  124* 122* 128* 126*  BUN 96*  --  99* 102* 98* 102*  CREATININE 3.93*  --  4.25* 4.38* 4.31* 4.12*  CALCIUM 8.8*  --  8.9 8.7* 8.8* 8.7*  MG  --   --  1.9  --   --   --    GFR: Estimated Creatinine Clearance: 14.6 mL/min (A)  (by C-G formula based on SCr of 4.12 mg/dL (H)). Liver Function Tests: Recent Labs  Lab 09/18/22 1436  ALBUMIN 2.5*   No results for input(s): "LIPASE", "AMYLASE" in the last 168 hours. No results for input(s): "AMMONIA" in the last 168 hours. Coagulation Profile: No results for input(s): "INR", "PROTIME" in the last 168 hours. Cardiac Enzymes: No results for input(s): "CKTOTAL", "CKMB", "CKMBINDEX", "TROPONINI" in the last 168 hours. BNP (last 3 results) No results for input(s): "PROBNP" in the last 8760 hours. HbA1C: No results for input(s): "HGBA1C" in the last 72 hours. CBG: No results for input(s): "GLUCAP" in the last 168 hours. Lipid Profile: No results for input(s): "CHOL", "HDL", "LDLCALC", "TRIG", "CHOLHDL", "LDLDIRECT" in the last 72 hours. Thyroid Function Tests: No results for input(s): "TSH", "T4TOTAL", "FREET4", "T3FREE", "THYROIDAB" in the last 72 hours. Anemia Panel: No results for input(s): "VITAMINB12", "FOLATE", "FERRITIN", "TIBC", "IRON", "RETICCTPCT" in the last 72 hours. Sepsis Labs: No results for input(s): "PROCALCITON", "LATICACIDVEN" in the last 168 hours.  Recent Results (from the past 240 hour(s))  SARS Coronavirus 2 by RT PCR (hospital order, performed in [facility] hospital lab) *cepheid single result test* Anterior Nasal Swab     Status: None   Collection Time: 09/19/22  1:31 PM   Specimen: Anterior Nasal Swab  Result Value Ref Range Status   SARS Coronavirus 2 by RT PCR NEGATIVE NEGATIVE Final    Comment: (NOTE) SARS-CoV-2 target nucleic acids are NOT DETECTED.  The SARS-CoV-2 RNA is generally detectable in upper and lower respiratory specimens during the acute phase of infection. The lowest concentration of SARS-CoV-2 viral copies this assay can detect is 250 copies / mL. A negative result does not preclude SARS-CoV-2 infection and should not be used as the sole basis for treatment or other patient management decisions.  A negative result  may occur with improper specimen collection / handling, submission of specimen other than nasopharyngeal swab, presence of viral mutation(s) within the areas targeted by this assay, and inadequate number of viral copies (<250 copies / mL). A negative result must be combined with clinical observations, patient history, and epidemiological information.  Fact Sheet for Patients:   RoadLapTop.co.za  Fact Sheet for Healthcare Providers: http://kim-miller.com/  This test is not yet approved or  cleared by the [place] FDA and has been authorized for detection and/or diagnosis of SARS-CoV-2 by FDA under an Emergency Use Authorization (EUA).  This EUA will remain in effect (meaning this test can be used) for the duration of the COVID-19 declaration under Section 564(b)(1) of the Act,  21 U.S.C. section 360bbb-3(b)(1), unless the authorization is terminated or revoked sooner.  Performed at [facility], [address] [place], [place] [address]   Respiratory (~20 pathogens) panel by PCR     Status: None   Collection Time: 09/19/22  1:31 PM   Specimen: Nasopharyngeal Swab; Respiratory  Result Value Ref Range Status   Adenovirus NOT DETECTED NOT DETECTED Final   Coronavirus 229E NOT DETECTED NOT DETECTED Final    Comment: (NOTE) The Coronavirus on the Respiratory Panel, DOES NOT test for the novel  Coronavirus (2019 nCoV)    Coronavirus HKU1 NOT DETECTED NOT DETECTED Final   Coronavirus NL63 NOT DETECTED NOT DETECTED Final   Coronavirus OC43 NOT DETECTED NOT DETECTED Final   Metapneumovirus NOT DETECTED NOT DETECTED Final   Rhinovirus / Enterovirus NOT DETECTED NOT DETECTED Final   Influenza A NOT DETECTED NOT DETECTED Final   Influenza B NOT DETECTED NOT DETECTED Final   Parainfluenza Virus 1 NOT DETECTED NOT DETECTED Final   Parainfluenza Virus 2 NOT DETECTED NOT DETECTED Final   Parainfluenza Virus 3 NOT DETECTED NOT  DETECTED Final   Parainfluenza Virus 4 NOT DETECTED NOT DETECTED Final   Respiratory Syncytial Virus NOT DETECTED NOT DETECTED Final   Bordetella pertussis NOT DETECTED NOT DETECTED Final   Bordetella Parapertussis NOT DETECTED NOT DETECTED Final   Chlamydophila pneumoniae NOT DETECTED NOT DETECTED Final   Mycoplasma pneumoniae NOT DETECTED NOT DETECTED Final    Comment: Performed at [facility], [address] [place], [place] [address]         Radiology Studies: DG Chest Port 1 View  Result Date: 09/23/2022 CLINICAL DATA:  Pulmonary infiltrates on chest x-ray. EXAM: PORTABLE CHEST 1 VIEW COMPARISON:  Chest radiograph 09/17/2022 and CT 09/18/2022 FINDINGS: The cardiac silhouette remains enlarged. Aortic atherosclerosis is noted. Widespread, predominantly interstitial type densities in both lungs have mildly improved from the prior radiograph. There may be a trace right pleural effusion. No pneumothorax is identified. IMPRESSION: Mildly improved bilateral lung infiltrates. Electronically Signed   By: Sebastian Ache M.D.   On: 09/23/2022 11:01        Scheduled Meds:  acidophilus  1 capsule Oral Daily   apixaban  2.5 mg Oral BID   cyanocobalamin  1,000 mcg Oral Daily   diltiazem  360 mg Oral Daily   feeding supplement  237 mL Oral BID BM   liver oil-zinc oxide   Topical TID   methylPREDNISolone (SOLU-MEDROL) injection  40 mg Intravenous Q24H   metoprolol succinate  100 mg Oral Daily   multivitamins with iron  1 tablet Oral Daily   pantoprazole  40 mg Oral Daily   polyethylene glycol  17 g Oral Daily   rosuvastatin  10 mg Oral Daily   senna-docusate  1 tablet Oral BID   sodium chloride flush  3 mL Intravenous Q12H   Continuous Infusions:  sodium chloride 10 mL/hr at 09/22/22 0450   sodium chloride     doxycycline (VIBRAMYCIN) IV 100 mg (09/23/22 0959)     LOS: 36 days     Silvano Bilis, MD [facility]   If 7PM-7AM, please contact  night-coverage  09/23/2022, 1:53 PM

## 2022-09-23 NOTE — TOC Progression Note (Signed)
Transition of Care [facility]) - Progression Note    Patient Details  Name: Brett Riley MRN: [id number] Date of Birth: 1935/09/19  Transition of [facility] CM/SW Contact  Darolyn Rua, [place] Phone Number: 09/23/2022, 12:12 PM  Clinical Narrative:     CSW notes patient with improved respiratory status.    Patient has insurance Auth approved for [employer] [id number], for 7/16-7/28 next review date 7/29, Marchelle Folks RN case manager clinicals can be faxed to [phone].   Pending medical improvements for discharge.   Expected Discharge Plan: Skilled Nursing Facility Barriers to Discharge: Continued Medical Work up, English as a second language teacher  Expected Discharge Plan and Services     Post Acute Care Choice: Skilled Nursing Facility Living arrangements for the past 2 months: Single Family Home                                       Social Determinants of Health (SDOH) Interventions SDOH Screenings   Food Insecurity: No Food Insecurity (08/18/2022)  Housing: Low Risk  (08/18/2022)  Transportation Needs: No Transportation Needs (08/18/2022)  Utilities: Not At Risk (08/18/2022)  Depression (PHQ2-9): Low Risk  (06/06/2022)  Financial Resource Strain: Low Risk  (08/29/2020)  Physical Activity: Insufficiently Active (08/29/2020)  Social Connections: Unknown (08/29/2020)  Stress: No Stress Concern Present (08/29/2020)  Tobacco Use: Low Risk  (09/19/2022)    Readmission Risk Interventions     No data to display

## 2022-09-23 NOTE — Progress Notes (Signed)
Physical Therapy Treatment Patient Details Name: Brett Riley MRN: [id number] DOB: 05-Jan-1936 Today's Date: 09/23/2022   History of Present Illness Brett Riley is a 87 y/o M comes to [facility] 6/16 with respiratory failure and AKI. PMH includes MI, CHF, a-fib, HTN, anemia. 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    PT Comments  Pt was pleasant and motivated to participate during the session and put forth good effort throughout. Pt required significant physical assistance with bed mobility tasks and to maintain static sitting balance once in sitting.  Pt was able to participate with seated weight shifting activities to address R posteriolateral lean but after sitting around 5-6 min began to c/o nausea and requested to return to supine, nursing in room and aware.  Pt put forth good effort with below therex with SpO2 on supplemental O2 in the upper 80s to low 90s.  Pt will benefit from continued PT services upon discharge to safely address deficits listed in patient problem list for decreased caregiver assistance and eventual return to PLOF.       Assistance Recommended at Discharge Frequent or constant Supervision/Assistance  If plan is discharge home, recommend the following:  Can travel by private vehicle    Assistance with cooking/housework;Assist for transportation;Two people to help with walking and/or transfers;Two people to help with bathing/dressing/bathroom;Help with stairs or ramp for entrance;Direct supervision/assist for medications management   No  Equipment Recommendations  Other (comment) (TBD)    Recommendations for Other Services       Precautions / Restrictions Precautions Precautions: Fall Restrictions Weight Bearing Restrictions: No Other Position/Activity Restrictions: monitor SpO2     Mobility  Bed  Mobility Overal bed mobility: Needs Assistance Bed Mobility: Supine to Sit, Sit to Supine     Supine to sit: Mod assist Sit to supine: Mod assist   General bed mobility comments: Mod A for BLE and trunk control with cues for general sequencing    Transfers                   General transfer comment: Pt declined to attempt secondary to nausea    Ambulation/Gait                   Stairs             Wheelchair Mobility     Tilt Bed    Modified Rankin (Stroke Patients Only)       Balance Overall balance assessment: Needs assistance Sitting-balance support: Feet supported, Bilateral upper extremity supported Sitting balance-Leahy Scale: Poor Sitting balance - Comments: Significant R posteriolateral lean with pt requiring min to mod A to correct Postural control: Right lateral lean, Posterior lean                                  Cognition Arousal/Alertness: Awake/alert Behavior During Therapy: WFL for tasks assessed/performed Overall Cognitive Status: Within Functional Limits for tasks assessed                                          Exercises Total Joint Exercises Ankle Circles/Pumps: Strengthening, Both, 10 reps (with manual resistance) Quad Sets: Strengthening, Both, 10 reps Gluteal Sets: Strengthening, Both, 10  reps Heel Slides: Strengthening, Both, 10 reps Hip ABduction/ADduction: Strengthening, AAROM, Both, 5 reps Straight Leg Raises: AAROM, Strengthening, Both, 5 reps Other Exercises Other Exercises: Anterior and L lateral weight shifting in sitting at EOB Other Exercises: Supine leg press x 10 L and R with manual resistance    General Comments        Pertinent Vitals/Pain Pain Assessment Pain Assessment: No/denies pain    Home Living                          Prior Function            PT Goals (current goals can now be found in the care plan section) Progress towards PT goals:  Not progressing toward goals - comment (limited by nausea this session)    Frequency    Min 1X/week      PT Plan Current plan remains appropriate    Co-evaluation              AM-PAC PT "6 Clicks" Mobility   Outcome Measure  Help needed turning from your back to your side while in a flat bed without using bedrails?: A Lot Help needed moving from lying on your back to sitting on the side of a flat bed without using bedrails?: A Lot Help needed moving to and from a bed to a chair (including a wheelchair)?: Total Help needed standing up from a chair using your arms (e.g., wheelchair or bedside chair)?: Total Help needed to walk in hospital room?: Total Help needed climbing 3-5 steps with a railing? : Total 6 Click Score: 8    End of Session Equipment Utilized During Treatment: Oxygen Activity Tolerance: Other (comment) (limited by nausea) Patient left: in bed;with call bell/phone within reach;with bed alarm set Nurse Communication: Mobility status;Other (comment) (limited by nausea) PT Visit Diagnosis: Muscle weakness (generalized) (M62.81);Difficulty in walking, not elsewhere classified (R26.2);Unsteadiness on feet (R26.81);Other abnormalities of gait and mobility (R26.89)     Time: 2956-2130 PT Time Calculation (min) (ACUTE ONLY): 27 min  Charges:    $Therapeutic Exercise: 8-22 mins $Therapeutic Activity: 8-22 mins PT General Charges $$ ACUTE PT VISIT: 1 Visit                     D. Scott Layson Bertsch PT, DPT 09/23/22, 3:09 PM

## 2022-09-23 NOTE — Progress Notes (Signed)
[facility]  ROUNDING NOTE   Subjective:   Patient seen sitting up in bed Alert and oriented, appears well and pleasant  Wife at bedside Respiratory status has improved Awaiting chest xray ordered by primary team  Oxygen weaned to 3L HFNC Lower extremity edema improving  Creatinine 4.12 UOP 1L in past 24 hours   Objective:  Vital signs in last 24 hours:  Temp:  [97.6 F (36.4 C)-98 F (36.7 C)] 97.7 F (36.5 C) (07/22 1111) Pulse Rate:  [62-67] 64 (07/22 1111) Resp:  [16-20] 16 (07/22 1111) BP: (131-146)/(70-79) 146/79 (07/22 1111) SpO2:  [94 %-97 %] 96 % (07/22 1111) Weight:  [79.7 kg] 79.7 kg (07/22 0711)  Weight change:  Filed Weights   09/21/22 0707 09/22/22 0513 09/23/22 0711  Weight: 76 kg 78.4 kg 79.7 kg    Intake/Output: I/O last 3 completed shifts: In: 50 [IV Piggyback:50] Out: 1700 [Urine:1700]   Intake/Output this shift:  Total I/O In: 480 [P.O.:480] Out: 700 [Urine:700]  Physical Exam: General: NAD, laying in bed  Head: Normocephalic, atraumatic. Moist oral mucosal membranes  Eyes: Anicteric  Lungs:  Bilateral crackles, HFNC  Heart: Regular rate and rhythm  Abdomen:  +distended, nontender  Extremities:  2+ peripheral edema.  TED hose  Neurologic: Alert, moving all four extremities  Skin: No lesions  Access: none    Basic Metabolic Panel: Recent Labs  Lab 09/19/22 0630 09/19/22 1659 09/20/22 0914 09/21/22 0521 09/22/22 0629 09/23/22 0446  NA 141 141 143 143 141 141  K 3.5 3.2* 3.1* 4.1 3.9 3.9  CL 101  --  99 102 102 101  CO2 26  --  28 28 27 25   GLUCOSE 106*  --  124* 122* 128* 126*  BUN 96*  --  99* 102* 98* 102*  CREATININE 3.93*  --  4.25* 4.38* 4.31* 4.12*  CALCIUM 8.8*  --  8.9 8.7* 8.8* 8.7*  MG  --   --  1.9  --   --   --     Liver Function Tests: Recent Labs  Lab 09/18/22 1436  ALBUMIN 2.5*    No results for input(s): "LIPASE", "AMYLASE" in the last 168 hours. No results for input(s): "AMMONIA" in  the last 168 hours.  CBC: Recent Labs  Lab 09/17/22 0924 09/18/22 1436 09/19/22 0630 09/19/22 1659 09/20/22 0914 09/22/22 0629  WBC 5.9 6.4 6.4  --  5.5 5.3  NEUTROABS 3.9  --   --   --   --   --   HGB 7.8* 8.0* 8.3* 8.2* 8.5* 8.1*  HCT 25.6* 25.2* 25.9* 24.0* 27.4* 25.7*  MCV 97.0 92.6 90.9  --  92.3 90.8  PLT 130* 152 165  --  174 201    Cardiac Enzymes: No results for input(s): "CKTOTAL", "CKMB", "CKMBINDEX", "TROPONINI" in the last 168 hours.   BNP: Invalid input(s): "POCBNP"  CBG: No results for input(s): "GLUCAP" in the last 168 hours.   Microbiology: Results for orders placed or performed during the hospital encounter of 08/18/22  Blood culture (routine x 2)     Status: None   Collection Time: 08/18/22 10:45 AM   Specimen: BLOOD LEFT ARM  Result Value Ref Range Status   Specimen Description BLOOD LEFT ARM  Final   Special Requests   Final    BOTTLES DRAWN AEROBIC AND ANAEROBIC Blood Culture adequate volume   Culture   Final    NO GROWTH 5 DAYS Performed at [facility], [address]  [place], [place] [address]    Report Status 08/23/2022 FINAL  Final  Resp Panel by RT-PCR (Flu A&B, Covid) Anterior Nasal Swab     Status: None   Collection Time: 08/18/22 10:46 AM   Specimen: Anterior Nasal Swab  Result Value Ref Range Status   SARS Coronavirus 2 by RT PCR NEGATIVE NEGATIVE Final    Comment: (NOTE) SARS-CoV-2 target nucleic acids are NOT DETECTED.  The SARS-CoV-2 RNA is generally detectable in upper respiratory specimens during the acute phase of infection. The lowest concentration of SARS-CoV-2 viral copies this assay can detect is 138 copies/mL. A negative result does not preclude SARS-Cov-2 infection and should not be used as the sole basis for treatment or other patient management decisions. A negative result may occur with  improper specimen collection/handling, submission of specimen other than nasopharyngeal swab, presence of viral  mutation(s) within the areas targeted by this assay, and inadequate number of viral copies(<138 copies/mL). A negative result must be combined with clinical observations, patient history, and epidemiological information. The expected result is Negative.  Fact Sheet for Patients:  BloggerCourse.com  Fact Sheet for Healthcare Providers:  SeriousBroker.it  This test is no t yet approved or cleared by the [place] FDA and  has been authorized for detection and/or diagnosis of SARS-CoV-2 by FDA under an Emergency Use Authorization (EUA). This EUA will remain  in effect (meaning this test can be used) for the duration of the COVID-19 declaration under Section 564(b)(1) of the Act, 21 U.S.C.section 360bbb-3(b)(1), unless the authorization is terminated  or revoked sooner.       Influenza A by PCR NEGATIVE NEGATIVE Final   Influenza B by PCR NEGATIVE NEGATIVE Final    Comment: (NOTE) The Xpert Xpress SARS-CoV-2/FLU/RSV plus assay is intended as an aid in the diagnosis of influenza from Nasopharyngeal swab specimens and should not be used as a sole basis for treatment. Nasal washings and aspirates are unacceptable for Xpert Xpress SARS-CoV-2/FLU/RSV testing.  Fact Sheet for Patients: BloggerCourse.com  Fact Sheet for Healthcare Providers: SeriousBroker.it  This test is not yet approved or cleared by the [place] FDA and has been authorized for detection and/or diagnosis of SARS-CoV-2 by FDA under an Emergency Use Authorization (EUA). This EUA will remain in effect (meaning this test can be used) for the duration of the COVID-19 declaration under Section 564(b)(1) of the Act, 21 U.S.C. section 360bbb-3(b)(1), unless the authorization is terminated or revoked.  Performed at [facility], [address] [place], [place] [address]   Culture, blood (Routine X 2) w  Reflex to ID Panel     Status: None   Collection Time: 08/18/22  9:19 PM   Specimen: BLOOD LEFT ARM  Result Value Ref Range Status   Specimen Description BLOOD LEFT ARM  Final   Special Requests   Final    BOTTLES DRAWN AEROBIC AND ANAEROBIC Blood Culture adequate volume   Culture   Final    NO GROWTH 5 DAYS Performed at [facility], [address] [place], [place] [address]    Report Status 08/23/2022 FINAL  Final  Respiratory (~20 pathogens) panel by PCR     Status: None   Collection Time: 08/21/22  1:50 PM   Specimen: Nasopharyngeal Swab; Respiratory  Result Value Ref Range Status   Adenovirus NOT DETECTED NOT DETECTED Final   Coronavirus 229E NOT DETECTED NOT DETECTED Final    Comment: (NOTE) The Coronavirus on the Respiratory Panel, DOES NOT test for the novel  Coronavirus (2019 nCoV)    Coronavirus HKU1 NOT DETECTED NOT DETECTED Final   Coronavirus NL63 NOT DETECTED NOT DETECTED Final   Coronavirus OC43 NOT DETECTED NOT DETECTED Final   Metapneumovirus NOT DETECTED NOT DETECTED Final   Rhinovirus / Enterovirus NOT DETECTED NOT DETECTED Final   Influenza A NOT DETECTED NOT DETECTED Final   Influenza B NOT DETECTED NOT DETECTED Final   Parainfluenza Virus 1 NOT DETECTED NOT DETECTED Final   Parainfluenza Virus 2 NOT DETECTED NOT DETECTED Final   Parainfluenza Virus 3 NOT DETECTED NOT DETECTED Final   Parainfluenza Virus 4 NOT DETECTED NOT DETECTED Final   Respiratory Syncytial Virus NOT DETECTED NOT DETECTED Final   Bordetella pertussis NOT DETECTED NOT DETECTED Final   Bordetella Parapertussis NOT DETECTED NOT DETECTED Final   Chlamydophila pneumoniae NOT DETECTED NOT DETECTED Final   Mycoplasma pneumoniae NOT DETECTED NOT DETECTED Final    Comment: Performed at [id number], [address], [place], [place] [address]  Culture, blood (Routine X 2) w Reflex to ID Panel     Status: None   Collection Time: 08/21/22  8:58 PM   Specimen: BLOOD  Result Value  Ref Range Status   Specimen Description BLOOD LEFT WRIST  Final   Special Requests   Final    BOTTLES DRAWN AEROBIC AND ANAEROBIC Blood Culture adequate volume   Culture   Final    NO GROWTH 5 DAYS Performed at [facility], [address] [place], [place] [address]    Report Status 08/26/2022 FINAL  Final  Culture, blood (Routine X 2) w Reflex to ID Panel     Status: None   Collection Time: 08/21/22 11:01 PM   Specimen: BLOOD  Result Value Ref Range Status   Specimen Description BLOOD BLOOD LEFT ARM  Final   Special Requests   Final    BOTTLES DRAWN AEROBIC AND ANAEROBIC Blood Culture adequate volume   Culture   Final    NO GROWTH 5 DAYS Performed at [facility], [address] [place], [place] [address]    Report Status 08/26/2022 FINAL  Final  Expectorated Sputum Assessment w Gram Stain, Rflx to Resp Cult     Status: None   Collection Time: 08/22/22 11:22 AM   Specimen: Sputum  Result Value Ref Range Status   Specimen Description SPUTUM  Final   Special Requests EXPSU  Final   Sputum evaluation   Final    THIS SPECIMEN IS ACCEPTABLE FOR SPUTUM CULTURE Performed at [facility], [address], [place], [place] [address]    Report Status 08/22/2022 FINAL  Final  Culture, Respiratory w Gram Stain     Status: None   Collection Time: 08/22/22 11:22 AM   Specimen: SPU  Result Value Ref Range Status   Specimen Description   Final    SPUTUM Performed at [facility], [address], [place], [place] [address]    Special Requests   Final    EXPSU Reflexed from [id number] Performed at [facility], [address] [place], [place] [address]    Gram Stain   Final    ABUNDANT SQUAMOUS EPITHELIAL CELLS PRESENT FEW WBC PRESENT, PREDOMINANTLY PMN ABUNDANT GRAM VARIABLE ROD ABUNDANT GRAM POSITIVE COCCI IN PAIRS    Culture   Final    Normal respiratory flora-no Staph aureus or Pseudomonas seen Performed at [facility], [address], [place], [place] [address]    Report Status 08/25/2022 FINAL  Final  SARS Coronavirus 2 by RT PCR (hospital order, performed in [facility] hospital lab) *cepheid single result test* Anterior Nasal Swab     Status: None   Collection Time: 09/19/22  1:31 PM   Specimen: Anterior Nasal Swab  Result Value Ref Range Status   SARS Coronavirus 2 by RT PCR NEGATIVE NEGATIVE Final    Comment: (NOTE) SARS-CoV-2 target nucleic acids are NOT DETECTED.  The SARS-CoV-2 RNA is generally detectable in upper and lower respiratory specimens during the acute phase of infection. The lowest concentration of SARS-CoV-2 viral copies this assay can detect is 250 copies / mL. A negative result does not preclude SARS-CoV-2 infection and should not be used as the sole basis for treatment or other patient management decisions.  A negative result may occur with improper specimen collection / handling, submission of specimen other than nasopharyngeal swab, presence of viral mutation(s) within the areas targeted by this assay, and inadequate number of viral copies (<250 copies / mL). A negative result must be combined with clinical observations, patient history, and epidemiological information.  Fact Sheet for Patients:   RoadLapTop.co.za  Fact Sheet for Healthcare Providers: http://kim-miller.com/  This test is not yet approved or  cleared by the [place] FDA and has been authorized for detection and/or diagnosis of SARS-CoV-2 by FDA under an Emergency Use Authorization (EUA).  This EUA will remain in effect (meaning this test can be used) for the duration of the COVID-19 declaration under Section 564(b)(1) of the Act, 21 U.S.C. section 360bbb-3(b)(1), unless the authorization is terminated or revoked sooner.  Performed at [facility], [address] [place], [place] [address]   Respiratory (~20 pathogens) panel by PCR     Status:  None   Collection Time: 09/19/22  1:31 PM   Specimen: Nasopharyngeal Swab; Respiratory  Result Value Ref Range Status   Adenovirus NOT DETECTED NOT DETECTED Final   Coronavirus 229E NOT DETECTED NOT DETECTED Final    Comment: (NOTE) The Coronavirus on the Respiratory Panel, DOES NOT test for the novel  Coronavirus (2019 nCoV)    Coronavirus HKU1 NOT DETECTED NOT DETECTED Final   Coronavirus NL63 NOT DETECTED NOT DETECTED Final   Coronavirus OC43 NOT DETECTED NOT DETECTED Final   Metapneumovirus NOT DETECTED NOT DETECTED Final   Rhinovirus / Enterovirus NOT DETECTED NOT DETECTED Final   Influenza A NOT DETECTED NOT DETECTED Final   Influenza B NOT DETECTED NOT DETECTED Final   Parainfluenza Virus 1 NOT DETECTED NOT DETECTED Final   Parainfluenza Virus 2 NOT DETECTED NOT DETECTED Final   Parainfluenza Virus 3 NOT DETECTED NOT DETECTED Final   Parainfluenza Virus 4 NOT DETECTED NOT DETECTED Final   Respiratory Syncytial Virus NOT DETECTED NOT DETECTED Final   Bordetella pertussis NOT DETECTED NOT DETECTED Final   Bordetella Parapertussis NOT DETECTED NOT DETECTED Final   Chlamydophila pneumoniae NOT DETECTED NOT DETECTED Final   Mycoplasma pneumoniae NOT DETECTED NOT DETECTED Final    Comment: Performed at [facility], [address] [place], [place] [address]    Coagulation Studies: No results for input(s): "LABPROT", "INR" in the last 72 hours.  Urinalysis: No results for input(s): "COLORURINE", "LABSPEC", "PHURINE", "GLUCOSEU", "HGBUR", "BILIRUBINUR", "KETONESUR", "PROTEINUR", "UROBILINOGEN", "NITRITE", "LEUKOCYTESUR" in the last 72 hours.  Invalid input(s): "APPERANCEUR"     Imaging: DG Chest Port 1 View  Result Date: 09/23/2022 CLINICAL DATA:  Pulmonary infiltrates on chest x-ray. EXAM: PORTABLE CHEST 1 VIEW COMPARISON:  Chest radiograph 09/17/2022 and CT  09/18/2022 FINDINGS: The cardiac silhouette remains enlarged. Aortic atherosclerosis is noted. Widespread,  predominantly interstitial type densities in both lungs have mildly improved from the prior radiograph. There may be a trace right pleural effusion. No pneumothorax is identified. IMPRESSION: Mildly improved bilateral lung infiltrates. Electronically Signed   By: Sebastian Ache M.D.   On: 09/23/2022 11:01     Medications:    sodium chloride 10 mL/hr at 09/22/22 0450   sodium chloride     doxycycline (VIBRAMYCIN) IV 100 mg (09/23/22 0959)    acidophilus  1 capsule Oral Daily   apixaban  2.5 mg Oral BID   cyanocobalamin  1,000 mcg Oral Daily   diltiazem  360 mg Oral Daily   feeding supplement  237 mL Oral BID BM   liver oil-zinc oxide   Topical TID   methylPREDNISolone (SOLU-MEDROL) injection  40 mg Intravenous Q24H   metoprolol succinate  100 mg Oral Daily   multivitamins with iron  1 tablet Oral Daily   pantoprazole  40 mg Oral Daily   polyethylene glycol  17 g Oral Daily   rosuvastatin  10 mg Oral Daily   senna-docusate  1 tablet Oral BID   sodium chloride flush  3 mL Intravenous Q12H   sodium chloride, sodium chloride, acetaminophen, albuterol, bisacodyl, dextromethorphan-guaiFENesin, melatonin, ondansetron (ZOFRAN) IV, simethicone, sodium chloride, sodium chloride flush, traMADol  Assessment/ Plan:  [name] is a 87 y.o.  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  Acute kidney injury with proteinuria and hematuria on baseline creatinine of 1.05 GFR > 60 on 07/02/22.  Secondary to ATN versus AIN from  cefazolin. IV contrast exposure on 07/24/22. -Creatinine 4.12, maintaining good urination  - No acute indication for dialysis at this time  2. Acute respiratory failure requiring HFNC. IV Furosemide drip. Ileus seen on KUB. CT chest suspicious for pneumonia. Cardiology performed RHC and recommend holding diuresis.   - Diuretic held per cardiology recommendation     3.  Acute on chronic diastolic heart failure. With LE edema  Ejection fraction 55 to 60% with grade 1 diastolic dysfunction.  4.  Anemia with renal failure:     Lab Results  Component Value Date   HGB 8.1 (L) 09/22/2022  Hgb below optimal range. Monitoring for now    LOS: 36   7/22/20242:13 PM

## 2022-09-23 NOTE — Progress Notes (Signed)
Occupational Therapy Treatment Patient Details Name: Brett Riley MRN: [id number] DOB: 28-Dec-1935 Today's Date: 09/23/2022   History of present illness Brett Riley is a 87 y/o M comes [facility] 6/16 with respiratory failure and AKI. PMH includes MI, CHF, a-fib, HTN, anemia. 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   OT comments  Pt seen for OT tx limited by pt's fatigue this date. Pt required MIN A for thoroughness washing his face and removing debris from his beard with additional assist for his nasal cannula positioning. With [facility] lowered to pt's preference, he was able to use bed rails to boost himself up in the bed with MIN A to optimize positioning. Declined EOB/OOB. Instructed in bed level BUE exercises and pt able to return demo. SpO2 93% on R index finger (lower reading using L index finger) on 3L, RN notified. Will continue to progress as able.    Recommendations for follow up therapy are one component of a multi-disciplinary discharge planning process, led by the attending physician.  Recommendations may be updated based on patient status, additional functional criteria and insurance authorization.    Assistance Recommended at Discharge Frequent or constant Supervision/Assistance  Patient can return home with the following  Two people to help with walking and/or transfers;A lot of help with bathing/dressing/bathroom;Assistance with cooking/housework;Direct supervision/assist for medications management;Direct supervision/assist for financial management;Assist for transportation;Help with stairs or ramp for entrance   Equipment Recommendations  Other (comment) (defer to next venue)    Recommendations for Other Services      Precautions / Restrictions Precautions Precautions: Fall Restrictions Weight Bearing Restrictions: No Other  Position/Activity Restrictions: monitor SpO2       Mobility Bed Mobility Overal bed mobility: Needs Assistance             General bed mobility comments: With [facility] lowered to pt's preference, he was able to use bed rails to boost himself up in the bed with MIN A to optimize positioning. Declined EOB/OOB.    Transfers                         Balance                                           ADL either performed or assessed with clinical judgement   ADL Overall ADL's : Needs assistance/impaired     Grooming: Bed level;Wash/dry face;Set up Grooming Details (indicate cue type and reason): MIN A for thoroughness to cleanse beard well                                    Extremity/Trunk Assessment              Vision       Perception     Praxis      Cognition Arousal/Alertness: Awake/alert Behavior During Therapy: WFL for tasks assessed/performed Overall Cognitive Status: Within Functional Limits for tasks assessed  Exercises Other Exercises Other Exercises: Pt instructed in BUE exercises and pt able to return demo    Shoulder Instructions       General Comments On 3L SpO2 on L hand 87-89%, R hand 89%-93%. RN notified.    Pertinent Vitals/ Pain       Pain Assessment Pain Assessment: No/denies pain  Home Living                                          Prior Functioning/Environment              Frequency  Min 1X/week        Progress Toward Goals  OT Goals(current goals can now be found in the care plan section)  Progress towards OT goals: Progressing toward goals  Acute Rehab OT Goals Patient Stated Goal: get better OT Goal Formulation: With patient Time For Goal Achievement: 10/08/22 Potential to Achieve Goals: Fair  Plan Frequency remains appropriate;Discharge plan remains appropriate    Co-evaluation                  AM-PAC OT "6 Clicks" Daily Activity     Outcome Measure   Help from another person eating meals?: A Little Help from another person taking care of personal grooming?: A Little Help from another person toileting, which includes using toliet, bedpan, or urinal?: Total Help from another person bathing (including washing, rinsing, drying)?: Total Help from another person to put on and taking off regular upper body clothing?: A Lot Help from another person to put on and taking off regular lower body clothing?: Total 6 Click Score: 11    End of Session Equipment Utilized During Treatment: Oxygen (3L)  OT Visit Diagnosis: Unsteadiness on feet (R26.81);Muscle weakness (generalized) (M62.81)   Activity Tolerance Patient limited by fatigue   Patient Left in bed;with call bell/phone within reach;with bed alarm set   Nurse Communication Other (comment) (RN - SpO2)        Time: 2536-6440 OT Time Calculation (min): 9 min  Charges: OT General Charges $OT Visit: 1 Visit OT Treatments $Therapeutic Activity: 8-22 mins  Arman Filter., MPH, MS, OTR/L ascom [phone] 09/23/22, 4:14 PM

## 2022-09-23 NOTE — Progress Notes (Signed)
Daily Progress Note   Patient Name: Brett Riley       Date: 09/23/2022 DOB: 01/05/1936  Age: 87 y.o. MRN#: [id number] Attending Physician: Kathrynn Running, MD Primary Care Physician: Dale Fairfield, MD Admit Date: 08/18/2022  Reason for Consultation/Follow-up: Establishing goals of care  Subjective: Notes and labs reviewed. In to see patient. He is resting in bed. No family at bedside. Patient is familiar from previous admission 6/4. He discusses feeling weak and frail since last admission. 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  He states he now has someone to mow his yard for him, and he is not able to work as he had previously. He is hopeful to proceed to rehab and regain strength. He discusses being happy he has decreased O2 requirements from 10 lpm to 3 lpm.   Length of Stay: 36  Current Medications: Scheduled Meds:   acidophilus  1 capsule Oral Daily   apixaban  2.5 mg Oral BID   cyanocobalamin  1,000 mcg Oral Daily   diltiazem  360 mg Oral Daily   feeding supplement  237 mL Oral BID BM   liver oil-zinc oxide   Topical TID   methylPREDNISolone (SOLU-MEDROL) injection  40 mg Intravenous Q24H   metoprolol succinate  100 mg Oral Daily   multivitamins with iron  1 tablet Oral Daily   pantoprazole  40 mg Oral Daily   polyethylene glycol  17 g Oral Daily   rosuvastatin  10 mg Oral Daily   senna-docusate  1 tablet Oral BID   sodium chloride flush  3 mL Intravenous Q12H    Continuous Infusions:  sodium chloride 10 mL/hr at 09/22/22 0450   sodium chloride     doxycycline (VIBRAMYCIN) IV 100 mg (09/23/22 0959)    PRN Meds: sodium chloride, sodium chloride, acetaminophen, albuterol, bisacodyl, dextromethorphan-guaiFENesin,  melatonin, ondansetron (ZOFRAN) IV, simethicone, sodium chloride, sodium chloride flush, traMADol  Physical Exam Pulmonary:     Effort: Pulmonary effort is normal.  Neurological:     Mental Status: He is alert.             Vital Signs: BP (!) 146/79 (BP Location: Left Arm)   Pulse 64   Temp 97.7 F (36.5 C) (Oral)   Resp 16   Ht  6' (1.829 m)   Wt 79.7 kg   SpO2 96%   BMI 23.83 kg/m  SpO2: SpO2: 96 % O2 Device: O2 Device: Nasal Cannula O2 Flow Rate: O2 Flow Rate (L/min): 3 L/min  Intake/output summary:  Intake/Output Summary (Last 24 hours) at 09/23/2022 1500 Last data filed at 09/23/2022 1012 Gross per 24 hour  Intake 480 ml  Output 1450 ml  Net -970 ml   LBM: Last BM Date : 09/22/22 Baseline Weight: Weight: 91.9 kg Most recent weight: Weight: 79.7 kg    Patient Active Problem List   Diagnosis Date Noted   Stage 3 chronic kidney disease (HCC) 08/27/2022   Status post thoracentesis 08/25/2022   Heart failure with preserved ejection fraction (HCC) 08/25/2022   Acute on chronic HFrEF (heart failure with reduced ejection fraction) (HCC) 08/24/2022   Pleural effusion on right 08/24/2022   Pleural effusion 08/23/2022   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   HCAP (healthcare-associated pneumonia) 08/21/2022   Sepsis (HCC) 08/21/2022   Paroxysmal atrial fibrillation (HCC) 08/20/2022   Acute respiratory failure with hypoxia (HCC) 08/18/2022   Myocardial injury 08/18/2022   Acute on chronic heart failure with preserved ejection fraction (HFpEF) (HCC) 08/18/2022   Chronic kidney disease, stage 3a (HCC) 08/18/2022   Normocytic anemia 08/18/2022   Atrial fibrillation, chronic (HCC) 08/18/2022   MSSA bacteremia 08/05/2022   Vertebral osteomyelitis (HCC) 08/05/2022   Acute osteomyelitis of lumbar spine (HCC) 08/03/2022   Metabolic acidosis 08/03/2022   Discitis of lumbar region 08/03/2022   Epidural abscess 08/03/2022   MSSA (methicillin susceptible Staphylococcus  aureus) septicemia (HCC) 08/02/2022   AKI (acute kidney injury) (HCC) 08/01/2022   Effusion of right knee 08/01/2022   Acute metabolic encephalopathy 08/01/2022   Hyponatremia 07/31/2022   Overweight (BMI 25.0-29.9) 07/31/2022   Persistent atrial fibrillation (HCC) 07/30/2022   Atrial fibrillation with rapid ventricular response (HCC) 07/23/2022   PVD (peripheral vascular disease) (HCC) 07/21/2022   Penetrating atherosclerotic ulcer of aorta (HCC) 07/20/2022   Uncontrolled hypertension 07/20/2022   Dyslipidemia 07/20/2022   Coronary artery disease 07/20/2022   Hypoxemia 07/20/2022   Scalp lesion 10/14/2021   Abnormal CT of the abdomen 04/09/2021   Open wound of skin 10/16/2020   Atherosclerotic ulcer of aorta (HCC) 08/27/2020   Leg pain 01/10/2020   Fever 10/03/2019   Hypertension 06/01/2019   Aneurysm artery, 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   Dizziness 08/04/2016   Abdominal bruit 08/04/2016   Carotid stenosis, asymptomatic, left 02/15/2016   CAD (coronary artery disease)    Preop cardiovascular exam    Health care maintenance 09/11/2014   Hip region mass 12/12/2013   Soft tissue mass 11/28/2013   Fatigue 07/28/2013   Environmental allergies 07/28/2013   Carotid artery disease (HCC) 01/21/2013   Thrombocytopenia (HCC) 07/26/2012   Anemia 07/26/2012   Essential hypertension, benign 05/24/2012   Hyperlipidemia 05/24/2012    Palliative Care Assessment & Plan    Recommendations/Plan: PMT shadowing, goals set for DNR and treat the treatable.    Code Status:    Code Status Orders  (From admission, onward)           Start     Ordered   08/18/22 1144  Do not attempt resuscitation (DNR)  Continuous       Question Answer Comment  If patient has no pulse and is not breathing Do Not Attempt Resuscitation   If patient has a pulse and/or is breathing: Medical Treatment Goals COMFORT MEASURES: Keep clean/warm/dry, use medication by any route; positioning,  wound care and  other measures to relieve pain/suffering; use oxygen, suction/manual treatment of airway obstruction for comfort; do not transfer unless for comfort needs.   Consent: Discussion documented in EHR or advanced directives reviewed      08/18/22 1144           Code Status History     Date Active Date Inactive Code Status Order ID Comments User Context   08/05/2022 1615 08/09/2022 1619 DNR [id number]  Marrion Coy, MD Inpatient   07/20/2022 2007 08/05/2022 1615 Full Code [id number]  Mansy, Vernetta Honey, MD ED   02/15/2016 1807 02/16/2016 1851 Full Code [id number]  Annice Needy, MD Inpatient   01/19/2016 0941 01/19/2016 1552 Full Code [id number]  Iran Ouch, MD Inpatient      Advance Directive Documentation    Flowsheet Row Most Recent Value  Type of Advance Directive Living will, Healthcare Power of Attorney  Pre-existing out of facility DNR order (yellow form or pink MOST form) --  "MOST" Form in Place? --       Thank you for allowing the Palliative Medicine Team to assist in the care of this patient.   Morton Stall, NP  Please contact Palliative Medicine Team phone at [phone] for questions and concerns.

## 2022-09-24 DIAGNOSIS — J9601 Acute respiratory failure with hypoxia: Secondary | ICD-10-CM | POA: Diagnosis not present

## 2022-09-24 LAB — BASIC METABOLIC PANEL WITH GFR
Anion gap: 13 (ref 5–15)
BUN: 100 mg/dL — ABNORMAL HIGH (ref 8–23)
CO2: 27 mmol/L (ref 22–32)
Calcium: 8.6 mg/dL — ABNORMAL LOW (ref 8.9–10.3)
Chloride: 102 mmol/L (ref 98–111)
Creatinine, Ser: 3.71 mg/dL — ABNORMAL HIGH (ref 0.61–1.24)
GFR, Estimated: 15 mL/min — ABNORMAL LOW
Glucose, Bld: 125 mg/dL — ABNORMAL HIGH (ref 70–99)
Potassium: 3.9 mmol/L (ref 3.5–5.1)
Sodium: 142 mmol/L (ref 135–145)

## 2022-09-24 LAB — LIPOPROTEIN A (LPA): Lipoprotein (a): 72.3 nmol/L — ABNORMAL HIGH

## 2022-09-24 NOTE — Progress Notes (Addendum)
[facility]  ROUNDING NOTE   Subjective:   Patient sitting up in bed Alert and oriented States he feels good today  Creatinine 3.71 UOP 2L in past 24 hours   Objective:  Vital signs in last 24 hours:  Temp:  [97.4 F (36.3 C)-98.1 F (36.7 C)] 97.5 F (36.4 C) (07/23 1122) Pulse Rate:  [61-69] 69 (07/23 1122) Resp:  [18-20] 20 (07/23 1122) BP: (127-154)/(74-108) 127/74 (07/23 1122) SpO2:  [93 %-100 %] 100 % (07/23 1122) Weight:  [77.1 kg] 77.1 kg (07/23 0428)  Weight change:  Filed Weights   09/22/22 0513 09/23/22 0711 09/24/22 0428  Weight: 78.4 kg 79.7 kg 77.1 kg    Intake/Output: I/O last 3 completed shifts: In: 1370 [P.O.:720; IV Piggyback:650] Out: 2750 [Urine:2750]   Intake/Output this shift:  No intake/output data recorded.  Physical Exam: General: NAD, laying in bed  Head: Normocephalic, atraumatic. Moist oral mucosal membranes  Eyes: Anicteric  Lungs:  Bilateral crackles, [place]  Heart: Regular rate and rhythm  Abdomen:  +distended, nontender  Extremities:  1-2+ peripheral edema.  TED hose  Neurologic: Alert, moving all four extremities  Skin: No lesions  Access: none    Basic Metabolic Panel: Recent Labs  Lab 09/20/22 0914 09/21/22 0521 09/22/22 0629 09/23/22 0446 09/24/22 0444  NA 143 143 141 141 142  K 3.1* 4.1 3.9 3.9 3.9  CL 99 102 102 101 102  CO2 28 28 27 25 27   GLUCOSE 124* 122* 128* 126* 125*  BUN 99* 102* 98* 102* 100*  CREATININE 4.25* 4.38* 4.31* 4.12* 3.71*  CALCIUM 8.9 8.7* 8.8* 8.7* 8.6*  MG 1.9  --   --   --   --     Liver Function Tests: Recent Labs  Lab 09/18/22 1436  ALBUMIN 2.5*    No results for input(s): "LIPASE", "AMYLASE" in the last 168 hours. No results for input(s): "AMMONIA" in the last 168 hours.  CBC: Recent Labs  Lab 09/18/22 1436 09/19/22 0630 09/19/22 1659 09/20/22 0914 09/22/22 0629  WBC 6.4 6.4  --  5.5 5.3  HGB 8.0* 8.3* 8.2* 8.5* 8.1*  HCT 25.2* 25.9* 24.0* 27.4* 25.7*   MCV 92.6 90.9  --  92.3 90.8  PLT 152 165  --  174 201    Cardiac Enzymes: No results for input(s): "CKTOTAL", "CKMB", "CKMBINDEX", "TROPONINI" in the last 168 hours.   BNP: Invalid input(s): "POCBNP"  CBG: No results for input(s): "GLUCAP" in the last 168 hours.   Microbiology: Results for orders placed or performed during the hospital encounter of 08/18/22  Blood culture (routine x 2)     Status: None   Collection Time: 08/18/22 10:45 AM   Specimen: BLOOD LEFT ARM  Result Value Ref Range Status   Specimen Description BLOOD LEFT ARM  Final   Special Requests   Final    BOTTLES DRAWN AEROBIC AND ANAEROBIC Blood Culture adequate volume   Culture   Final    NO GROWTH 5 DAYS Performed at [facility], [address] [place], [place] [address]    Report Status 08/23/2022 FINAL  Final  Resp Panel by RT-PCR (Flu A&B, Covid) Anterior Nasal Swab     Status: None   Collection Time: 08/18/22 10:46 AM   Specimen: Anterior Nasal Swab  Result Value Ref Range Status   SARS Coronavirus 2 by RT PCR NEGATIVE NEGATIVE Final    Comment: (NOTE) SARS-CoV-2 target nucleic acids are NOT DETECTED.  The SARS-CoV-2 RNA is generally detectable  in upper respiratory specimens during the acute phase of infection. The lowest concentration of SARS-CoV-2 viral copies this assay can detect is 138 copies/mL. A negative result does not preclude SARS-Cov-2 infection and should not be used as the sole basis for treatment or other patient management decisions. A negative result may occur with  improper specimen collection/handling, submission of specimen other than nasopharyngeal swab, presence of viral mutation(s) within the areas targeted by this assay, and inadequate number of viral copies(<138 copies/mL). A negative result must be combined with clinical observations, patient history, and epidemiological information. The expected result is Negative.  Fact Sheet for Patients:   BloggerCourse.com  Fact Sheet for Healthcare Providers:  SeriousBroker.it  This test is no t yet approved or cleared by the [place] FDA and  has been authorized for detection and/or diagnosis of SARS-CoV-2 by FDA under an Emergency Use Authorization (EUA). This EUA will remain  in effect (meaning this test can be used) for the duration of the COVID-19 declaration under Section 564(b)(1) of the Act, 21 U.S.C.section 360bbb-3(b)(1), unless the authorization is terminated  or revoked sooner.       Influenza A by PCR NEGATIVE NEGATIVE Final   Influenza B by PCR NEGATIVE NEGATIVE Final    Comment: (NOTE) The Xpert Xpress SARS-CoV-2/FLU/RSV plus assay is intended as an aid in the diagnosis of influenza from Nasopharyngeal swab specimens and should not be used as a sole basis for treatment. Nasal washings and aspirates are unacceptable for Xpert Xpress SARS-CoV-2/FLU/RSV testing.  Fact Sheet for Patients: BloggerCourse.com  Fact Sheet for Healthcare Providers: SeriousBroker.it  This test is not yet approved or cleared by the [place] FDA and has been authorized for detection and/or diagnosis of SARS-CoV-2 by FDA under an Emergency Use Authorization (EUA). This EUA will remain in effect (meaning this test can be used) for the duration of the COVID-19 declaration under Section 564(b)(1) of the Act, 21 U.S.C. section 360bbb-3(b)(1), unless the authorization is terminated or revoked.  Performed at [facility], [address] [place], [place] [address]   Culture, blood (Routine X 2) w Reflex to ID Panel     Status: None   Collection Time: 08/18/22  9:19 PM   Specimen: BLOOD LEFT ARM  Result Value Ref Range Status   Specimen Description BLOOD LEFT ARM  Final   Special Requests   Final    BOTTLES DRAWN AEROBIC AND ANAEROBIC Blood Culture adequate volume    Culture   Final    NO GROWTH 5 DAYS Performed at [facility], [address] [place], [place] [address]    Report Status 08/23/2022 FINAL  Final  Respiratory (~20 pathogens) panel by PCR     Status: None   Collection Time: 08/21/22  1:50 PM   Specimen: Nasopharyngeal Swab; Respiratory  Result Value Ref Range Status   Adenovirus NOT DETECTED NOT DETECTED Final   Coronavirus 229E NOT DETECTED NOT DETECTED Final    Comment: (NOTE) The Coronavirus on the Respiratory Panel, DOES NOT test for the novel  Coronavirus (2019 nCoV)    Coronavirus HKU1 NOT DETECTED NOT DETECTED Final   Coronavirus NL63 NOT DETECTED NOT DETECTED Final   Coronavirus OC43 NOT DETECTED NOT DETECTED Final   Metapneumovirus NOT DETECTED NOT DETECTED Final   Rhinovirus / Enterovirus NOT DETECTED NOT DETECTED Final   Influenza A NOT DETECTED NOT DETECTED Final   Influenza B NOT DETECTED NOT DETECTED Final   Parainfluenza Virus 1 NOT DETECTED NOT DETECTED Final  Parainfluenza Virus 2 NOT DETECTED NOT DETECTED Final   Parainfluenza Virus 3 NOT DETECTED NOT DETECTED Final   Parainfluenza Virus 4 NOT DETECTED NOT DETECTED Final   Respiratory Syncytial Virus NOT DETECTED NOT DETECTED Final   Bordetella pertussis NOT DETECTED NOT DETECTED Final   Bordetella Parapertussis NOT DETECTED NOT DETECTED Final   Chlamydophila pneumoniae NOT DETECTED NOT DETECTED Final   Mycoplasma pneumoniae NOT DETECTED NOT DETECTED Final    Comment: Performed at [facility], [address] [place], [place] [address]  Culture, blood (Routine X 2) w Reflex to ID Panel     Status: None   Collection Time: 08/21/22  8:58 PM   Specimen: BLOOD  Result Value Ref Range Status   Specimen Description BLOOD LEFT WRIST  Final   Special Requests   Final    BOTTLES DRAWN AEROBIC AND ANAEROBIC Blood Culture adequate volume   Culture   Final    NO GROWTH 5 DAYS Performed at [facility], [address] [place], [place]  [address]    Report Status 08/26/2022 FINAL  Final  Culture, blood (Routine X 2) w Reflex to ID Panel     Status: None   Collection Time: 08/21/22 11:01 PM   Specimen: BLOOD  Result Value Ref Range Status   Specimen Description BLOOD BLOOD LEFT ARM  Final   Special Requests   Final    BOTTLES DRAWN AEROBIC AND ANAEROBIC Blood Culture adequate volume   Culture   Final    NO GROWTH 5 DAYS Performed at [facility], [address], [place], [place] [address]    Report Status 08/26/2022 FINAL  Final  Expectorated Sputum Assessment w Gram Stain, Rflx to Resp Cult     Status: None   Collection Time: 08/22/22 11:22 AM   Specimen: Sputum  Result Value Ref Range Status   Specimen Description SPUTUM  Final   Special Requests EXPSU  Final   Sputum evaluation   Final    THIS SPECIMEN IS ACCEPTABLE FOR SPUTUM CULTURE Performed at [facility], [address], [place], [place] [address]    Report Status 08/22/2022 FINAL  Final  Culture, Respiratory w Gram Stain     Status: None   Collection Time: 08/22/22 11:22 AM   Specimen: SPU  Result Value Ref Range Status   Specimen Description   Final    SPUTUM Performed at [facility], [address] [place], [place] [address]    Special Requests   Final    EXPSU Reflexed from [id number] Performed at [facility], [address] [place], [place] [address]    Gram Stain   Final    ABUNDANT SQUAMOUS EPITHELIAL CELLS PRESENT FEW WBC PRESENT, PREDOMINANTLY PMN ABUNDANT GRAM VARIABLE ROD ABUNDANT GRAM POSITIVE COCCI IN PAIRS    Culture   Final    Normal respiratory flora-no Staph aureus or Pseudomonas seen Performed at [facility], [address], [place], [place] [address]    Report Status 08/25/2022 FINAL  Final  SARS Coronavirus 2 by RT PCR (hospital order, performed in [facility] hospital lab) *cepheid single result test* Anterior Nasal Swab     Status: None   Collection Time: 09/19/22  1:31 PM    Specimen: Anterior Nasal Swab  Result Value Ref Range Status   SARS Coronavirus 2 by RT PCR NEGATIVE NEGATIVE Final    Comment: (NOTE) SARS-CoV-2 target nucleic acids are NOT DETECTED.  The SARS-CoV-2 RNA is generally  detectable in upper and lower respiratory specimens during the acute phase of infection. The lowest concentration of SARS-CoV-2 viral copies this assay can detect is 250 copies / mL. A negative result does not preclude SARS-CoV-2 infection and should not be used as the sole basis for treatment or other patient management decisions.  A negative result may occur with improper specimen collection / handling, submission of specimen other than nasopharyngeal swab, presence of viral mutation(s) within the areas targeted by this assay, and inadequate number of viral copies (<250 copies / mL). A negative result must be combined with clinical observations, patient history, and epidemiological information.  Fact Sheet for Patients:   RoadLapTop.co.za  Fact Sheet for Healthcare Providers: http://kim-miller.com/  This test is not yet approved or  cleared by the [place] FDA and has been authorized for detection and/or diagnosis of SARS-CoV-2 by FDA under an Emergency Use Authorization (EUA).  This EUA will remain in effect (meaning this test can be used) for the duration of the COVID-19 declaration under Section 564(b)(1) of the Act, 21 U.S.C. section 360bbb-3(b)(1), unless the authorization is terminated or revoked sooner.  Performed at [facility], [address] [place], [place] [address]   Respiratory (~20 pathogens) panel by PCR     Status: None   Collection Time: 09/19/22  1:31 PM   Specimen: Nasopharyngeal Swab; Respiratory  Result Value Ref Range Status   Adenovirus NOT DETECTED NOT DETECTED Final   Coronavirus 229E NOT DETECTED NOT DETECTED Final    Comment: (NOTE) The Coronavirus on the Respiratory  Panel, DOES NOT test for the novel  Coronavirus (2019 nCoV)    Coronavirus HKU1 NOT DETECTED NOT DETECTED Final   Coronavirus NL63 NOT DETECTED NOT DETECTED Final   Coronavirus OC43 NOT DETECTED NOT DETECTED Final   Metapneumovirus NOT DETECTED NOT DETECTED Final   Rhinovirus / Enterovirus NOT DETECTED NOT DETECTED Final   Influenza A NOT DETECTED NOT DETECTED Final   Influenza B NOT DETECTED NOT DETECTED Final   Parainfluenza Virus 1 NOT DETECTED NOT DETECTED Final   Parainfluenza Virus 2 NOT DETECTED NOT DETECTED Final   Parainfluenza Virus 3 NOT DETECTED NOT DETECTED Final   Parainfluenza Virus 4 NOT DETECTED NOT DETECTED Final   Respiratory Syncytial Virus NOT DETECTED NOT DETECTED Final   Bordetella pertussis NOT DETECTED NOT DETECTED Final   Bordetella Parapertussis NOT DETECTED NOT DETECTED Final   Chlamydophila pneumoniae NOT DETECTED NOT DETECTED Final   Mycoplasma pneumoniae NOT DETECTED NOT DETECTED Final    Comment: Performed at [facility], [address] [place], [place] [address]    Coagulation Studies: No results for input(s): "LABPROT", "INR" in the last 72 hours.  Urinalysis: No results for input(s): "COLORURINE", "LABSPEC", "PHURINE", "GLUCOSEU", "HGBUR", "BILIRUBINUR", "KETONESUR", "PROTEINUR", "UROBILINOGEN", "NITRITE", "LEUKOCYTESUR" in the last 72 hours.  Invalid input(s): "APPERANCEUR"     Imaging: DG Chest Port 1 View  Result Date: 09/23/2022 CLINICAL DATA:  Pulmonary infiltrates on chest x-ray. EXAM: PORTABLE CHEST 1 VIEW COMPARISON:  Chest radiograph 09/17/2022 and CT 09/18/2022 FINDINGS: The cardiac silhouette remains enlarged. Aortic atherosclerosis is noted. Widespread, predominantly interstitial type densities in both lungs have mildly improved from the prior radiograph. There may be a trace right pleural effusion. No pneumothorax is identified. IMPRESSION: Mildly improved bilateral lung infiltrates. Electronically Signed   By: Sebastian Ache  M.D.   On: 09/23/2022 11:01     Medications:    sodium chloride 10 mL/hr at 09/22/22 0450   sodium chloride  doxycycline (VIBRAMYCIN) IV 100 mg (09/24/22 1000)    acidophilus  1 capsule Oral Daily   apixaban  2.5 mg Oral BID   cyanocobalamin  1,000 mcg Oral Daily   diltiazem  360 mg Oral Daily   feeding supplement  237 mL Oral BID BM   liver oil-zinc oxide   Topical TID   metoprolol succinate  100 mg Oral Daily   multivitamins with iron  1 tablet Oral Daily   pantoprazole  40 mg Oral Daily   polyethylene glycol  17 g Oral Daily   predniSONE  50 mg Oral Q breakfast   Followed by   Melene Muller ON 10/01/2022] predniSONE  45 mg Oral Q breakfast   Followed by   Melene Muller ON 10/08/2022] predniSONE  40 mg Oral Q breakfast   Followed by   Melene Muller ON 10/15/2022] predniSONE  35 mg Oral Q breakfast   Followed by   Melene Muller ON 10/22/2022] predniSONE  30 mg Oral Q breakfast   Followed by   Melene Muller ON 10/29/2022] predniSONE  25 mg Oral Q breakfast   Followed by   Melene Muller ON 11/05/2022] predniSONE  20 mg Oral Q breakfast   Followed by   Melene Muller ON 11/12/2022] predniSONE  15 mg Oral Q breakfast   Followed by   Melene Muller ON 11/19/2022] predniSONE  10 mg Oral Q breakfast   Followed by   Melene Muller ON 11/26/2022] predniSONE  5 mg Oral Q breakfast   rosuvastatin  10 mg Oral Daily   senna-docusate  1 tablet Oral BID   sodium chloride flush  3 mL Intravenous Q12H   sodium chloride, sodium chloride, acetaminophen, albuterol, bisacodyl, dextromethorphan-guaiFENesin, melatonin, ondansetron (ZOFRAN) IV, simethicone, sodium chloride, sodium chloride flush, traMADol  Assessment/ Plan:  [name] is a 87 y.o.  male with recent prolonged admission for aortic ulcer s/p endovascular repair 07/24/22, MSSA bacteremia with L spine osteomyelitis and discitis, chronic diastolic heart failure, hypertension, hyperlipidemia, coronary artery disease, atrial fibrillation who was admitted to [facility] on 08/18/2022 for HCAP  (healthcare-associated pneumonia) [J18.9] Acute on chronic respiratory failure with hypoxia (HCC) [J96.21] Sepsis, due to unspecified organism, unspecified whether acute organ dysfunction present (HCC) [A41.9]     1.  Acute kidney injury with proteinuria.  Baseline creatinine of 1.05 GFR > 60 on 07/02/22.  AKI likely Secondary to ATN versus AIN from cefazolin. IV contrast exposure on 07/24/22. - Creatinine improved to 3.71 - Recorded urine output of 2L - Supportive care. Avoid Nephrotoxins.   2. Acute respiratory failure required HFNC during hospitalization.    - Diuretic held per cardiology recommendation   - Good urine output noted   3.  Acute on chronic diastolic heart failure.  With LE edema   Ejection fraction 55 to 60% with grade 1 diastolic dysfunction.  4.  Anemia with renal failure:     Lab Results  Component Value Date   HGB 8.1 (L) 09/22/2022  Hgb below optimal range. Monitoring for now    LOS: 37 Brett Riley 7/23/20242:41 PM   Patient was seen and examined with Wendee Beavers, NP.  I personally formulated plan of care for the problems addressed and discussed with NP.  I agree with the note as documented except as noted below. I take the responsibility for the inherent risk of patient management.

## 2022-09-24 NOTE — Plan of Care (Signed)
  Problem: Education: Goal: Ability to demonstrate management of disease process will improve Outcome: Progressing Goal: Ability to verbalize understanding of medication therapies will improve Outcome: Progressing Goal: Individualized Educational Video(s) Outcome: Progressing   Problem: Activity: Goal: Capacity to carry out activities will improve Outcome: Progressing   Problem: Cardiac: Goal: Ability to achieve and maintain adequate cardiopulmonary perfusion will improve Outcome: Progressing   Problem: Activity: Goal: Ability to tolerate increased activity will improve Outcome: Progressing   Problem: Clinical Measurements: Goal: Ability to maintain a body temperature in the normal range will improve Outcome: Progressing   Problem: Respiratory: Goal: Ability to maintain adequate ventilation will improve Outcome: Progressing Goal: Ability to maintain a clear airway will improve Outcome: Progressing   Problem: Education: Goal: Knowledge of General Education information will improve Description: Including pain rating scale, medication(s)/side effects and non-pharmacologic comfort measures Outcome: Progressing   Problem: Health Behavior/Discharge Planning: Goal: Ability to manage health-related needs will improve Outcome: Progressing   Problem: Clinical Measurements: Goal: Ability to maintain clinical measurements within normal limits will improve Outcome: Progressing Goal: Will remain free from infection Outcome: Progressing Goal: Diagnostic test results will improve Outcome: Progressing Goal: Respiratory complications will improve Outcome: Progressing Goal: Cardiovascular complication will be avoided Outcome: Progressing   Problem: Activity: Goal: Risk for activity intolerance will decrease Outcome: Progressing   Problem: Nutrition: Goal: Adequate nutrition will be maintained Outcome: Progressing   Problem: Coping: Goal: Level of anxiety will  decrease Outcome: Progressing   Problem: Elimination: Goal: Will not experience complications related to bowel motility Outcome: Progressing Goal: Will not experience complications related to urinary retention Outcome: Progressing   Problem: Pain Managment: Goal: General experience of comfort will improve Outcome: Progressing   Problem: Safety: Goal: Ability to remain free from injury will improve Outcome: Progressing   Problem: Skin Integrity: Goal: Risk for impaired skin integrity will decrease Outcome: Progressing   Problem: Education: Goal: Understanding of CV disease, CV risk reduction, and recovery process will improve Outcome: Progressing Goal: Individualized Educational Video(s) Outcome: Progressing   Problem: Activity: Goal: Ability to return to baseline activity level will improve Outcome: Progressing   Problem: Cardiovascular: Goal: Ability to achieve and maintain adequate cardiovascular perfusion will improve Outcome: Progressing Goal: Vascular access site(s) Level 0-1 will be maintained Outcome: Progressing   Problem: Health Behavior/Discharge Planning: Goal: Ability to safely manage health-related needs after discharge will improve Outcome: Progressing

## 2022-09-24 NOTE — Progress Notes (Signed)
PROGRESS NOTE    Brett Riley  IEP:[id number] DOB: 1935-07-20 DOA: 08/18/2022 PCP: Dale Kaun[place]ai, MD    Brief Narrative:  87 y.o. male with medical history significant of chronic dCHF, HTN, HLD, CAD, CKD-3a, anemia, chronic A fib on Eliquis, who presents 08/18/2022 to ED from rehab, chief complaint SOB.  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 Net IO Since Admission: -5,549.14 mL [08/20/22 1608]. Heparin stopped d/t rectal bleeding overnight.  06/19: euvolemic per cardio, d/c lasix, O2 requirement still high, CT chest reviewed, ?edema/atypical infection, given clinically worsening respiratory status have asked pulmonary and ID to consult. Have d/c amiodarone and ordered steroids in case amio toxicity.  06/20: ID and pulmonary assisting with the management.   6/22-6/25: patient feels clinically improved. Stable with oxygen via [place] and weaned down to 2-3L. Patient requiring judicial adjustments to balance between hypervolemia and renal injury with diuresis. Unable to pull fluid from lungs with thoracentesis so medical management is best option. His IV Abx to treat his vertebral osteomyelitis from previous admission continue unchanged and managed by ID.    6/28: Bed offered at [facility].  Authorization pending 7/1: No status changes.  Per patient's daughter he has been seeming more depressed.  I have offered outpatient referral to psychiatry.   7/2: Insurance authorization for LTAC continues to be pending   7/3 : Peer-to-peer review done.  Not a candidate for LTAC.   Approved for skilled nursing facility patient   7/9 : Medically stable and ready for discharge to skilled nursing facility 7/10: No acute events.  Pending insurance authorization  7/15: Acute hypoxia noted this morning.  Placed on high flow nasal cannula.  Pulmonary edema noted on check chest x-ray.  Lasix restarted.  7/16: Further deterioration of respiratory status.  Required initiation of BiPAP initiation of Lasix gtt.   Assessment & Plan:   Principal Problem:   Acute respiratory failure with hypoxia (HCC) Active Problems:   Acute on chronic heart failure with preserved ejection fraction (HFpEF) (HCC)   MSSA bacteremia   Vertebral osteomyelitis (HCC)   CAD (coronary artery disease)   Myocardial injury   Hypertension   Hyperlipidemia   Chronic kidney disease, stage 3a (HCC)   Normocytic anemia   Atrial fibrillation, chronic (HCC)   PVD (peripheral vascular disease) (HCC)   Paroxysmal atrial fibrillation (HCC)   HCAP (healthcare-associated pneumonia)   Sepsis (HCC)   Pleural effusion   Amiodarone pulmonary toxicity   Acute on chronic HFrEF (heart failure with reduced ejection fraction) (HCC)   Pleural effusion on right   Status post thoracentesis   Heart failure with preserved ejection fraction (HCC)   Stage 3 chronic kidney disease (HCC)  Acute on chronic respiratory failure with hypoxia  Was previously on room air Saturation acutely worsened on 7/15 Thought to be chf and treated with lasix gtt. RHC on 7/18 showed normal filling pressures, this does not appear to be CHF. Inflammatory markers quite elevated, viral panel negative. ABX have been broadened to zosyn Now off lasix Plan: Abx broadened to zosyn on 7/17, and is now s/p 5 days treatment.  Inflammatory process likely driving things, pulmonology re-consulted on 7/18 and have started IV steroids on 7/19, continue those as likely the most helpful intervention at this point Weaning O2, down to 3 liters today  (10>9>5>4>3 Pulm has decided to place patient on an extended prednisone taper Will need bactrim ss 3x weekly for pjp ppx while on steroids  Debility Per toc SNF doesn't have bed today, maybe tomorrow  Normocytic anemia Thrombocytopenia Hgb stable around 8 and platelets now stable around 150 - monitor for now  AKI on Chronic kidney disease, stage 3a (HCC): Uop adequate, gfr pretty stable 10-15. Looks like ATN. Plan: Nephrology following now hopeful for renal recovery, no indication for dialysis at the present. Kidney function showing modest improvement Will need outpatient nephrology f/u  diastolic CHF-  Diuresis on hold as above   Pleural effusion IR unable to perform thoracentesis 6/20, 6/21. Effusion now resolved  Atrial fibrillation, chronic (HCC) Heart rate controlled.  On diltiazem and metoprolol per cardiology.  On Eliquis.  Monitor for bleeding. Per cardiology f/u with them in a few weeks   Recent hx of MSSA bacteremia vertebral osteomyelitis (HCC): Received Ancef until 7/11.  This was stopped and doxycycline 100 mg p.o. twice daily x 6 weeks started per ID recommendations (I.e. through 8/22).  PICC line removed 7/12. Cont doxy to IV given pulm concern about esophagitis given patient mainly bedbound currently, though he has been able to sit up at side of bed so think oral will be ok at discharge with appropriate precautions  Recent penetrating atherosclerotic ulcer Repaired by vascular surgery 07/2022   CAD  Myocardial injury  HLD:  Evidence of ACS Continue Crestor, beta-blocker, Eliquis No plans for inpatient ischemic evaluation    GERD PPI  Ileus resolved  DVT prophylaxis: Eliquis Code Status: DNR Family Communication:  wife at bedside 7/23 Disposition Plan: Status is: Inpatient Remains inpatient appropriate because: pending snf placement   Level of care: Progressive  Consultants:  Nephrology, pulmonology, cardiology  Procedures:  Right heart cath  7/18  Antimicrobials: Doxycycline zosyn   Subjective: Breathing stable. Tolerating diet. Having BMs.   Objective: Vitals:   09/24/22 0428 09/24/22 0817 09/24/22 1122 09/24/22 1542  BP:  (!) 142/79 127/74 127/67  Pulse:  66 69 64  Resp:  20 20 18   Temp:  (!) 97.4 F (36.3 C) (!) 97.5 F (36.4 C) 97.8 F (36.6 C)  TempSrc:    Oral  SpO2:  98% 100% 100%  Weight: 77.1 kg     Height:        Intake/Output Summary (Last 24 hours) at 09/24/2022 1637 Last data filed at 09/24/2022 1547 Gross per 24 hour  Intake 720 ml  Output 2050 ml  Net -1330 ml   Filed Weights   09/22/22 0513 09/23/22 0711 09/24/22 0428  Weight: 78.4 kg 79.7 kg 77.1 kg    Examination:  General exam: Appears fatigued Respiratory system: Scattered rales  bilaterally.  Normal work of breathing.  4 L Cardiovascular system: S1-S2, RRR, no murmurs, 2+ pedal edema Gastrointestinal system: Soft, NT/ND, normal bowel sounds Central nervous system: Alert and oriented. No focal neurological deficits. Extremities: mild LE edema Skin: No rashes, lesions or ulcers Psychiatry: Judgement and insight appear normal. Mood & affect appropriate.     Data Reviewed: I have personally reviewed following labs and imaging studies  CBC: Recent Labs  Lab 09/18/22 1436 09/19/22 0630 09/19/22 1659 09/20/22 0914 09/22/22 0629  WBC 6.4 6.4  --  5.5 5.3  HGB  8.0* 8.3* 8.2* 8.5* 8.1*  HCT 25.2* 25.9* 24.0* 27.4* 25.7*  MCV 92.6 90.9  --  92.3 90.8  PLT 152 165  --  174 201   Basic Metabolic Panel: Recent Labs  Lab 09/20/22 0914 09/21/22 0521 09/22/22 0629 09/23/22 0446 09/24/22 0444  NA 143 143 141 141 142  K 3.1* 4.1 3.9 3.9 3.9  CL 99 102 102 101 102  CO2 28 28 27 25 27   GLUCOSE 124* 122* 128* 126* 125*  BUN 99* 102* 98* 102* 100*  CREATININE 4.25* 4.38* 4.31* 4.12* 3.71*  CALCIUM 8.9 8.7* 8.8* 8.7* 8.6*  MG 1.9  --   --   --   --    GFR: Estimated Creatinine Clearance: 16.2 mL/min (A) (by C-G formula  based on SCr of 3.71 mg/dL (H)). Liver Function Tests: Recent Labs  Lab 09/18/22 1436  ALBUMIN 2.5*   No results for input(s): "LIPASE", "AMYLASE" in the last 168 hours. No results for input(s): "AMMONIA" in the last 168 hours. Coagulation Profile: No results for input(s): "INR", "PROTIME" in the last 168 hours. Cardiac Enzymes: No results for input(s): "CKTOTAL", "CKMB", "CKMBINDEX", "TROPONINI" in the last 168 hours. BNP (last 3 results) No results for input(s): "PROBNP" in the last 8760 hours. HbA1C: No results for input(s): "HGBA1C" in the last 72 hours. CBG: No results for input(s): "GLUCAP" in the last 168 hours. Lipid Profile: No results for input(s): "CHOL", "HDL", "LDLCALC", "TRIG", "CHOLHDL", "LDLDIRECT" in the last 72 hours. Thyroid Function Tests: No results for input(s): "TSH", "T4TOTAL", "FREET4", "T3FREE", "THYROIDAB" in the last 72 hours. Anemia Panel: No results for input(s): "VITAMINB12", "FOLATE", "FERRITIN", "TIBC", "IRON", "RETICCTPCT" in the last 72 hours. Sepsis Labs: No results for input(s): "PROCALCITON", "LATICACIDVEN" in the last 168 hours.  Recent Results (from the past 240 hour(s))  SARS Coronavirus 2 by RT PCR (hospital order, performed in [facility] hospital lab) *cepheid single result test* Anterior Nasal Swab     Status: None   Collection Time: 09/19/22  1:31 PM   Specimen: Anterior Nasal Swab  Result Value Ref Range Status   SARS Coronavirus 2 by RT PCR NEGATIVE NEGATIVE Final    Comment: (NOTE) SARS-CoV-2 target nucleic acids are NOT DETECTED.  The SARS-CoV-2 RNA is generally detectable in upper and lower respiratory specimens during the acute phase of infection. The lowest concentration of SARS-CoV-2 viral copies this assay can detect is 250 copies / mL. A negative result does not preclude SARS-CoV-2 infection and should not be used as the sole basis for treatment or other patient management decisions.  A negative result may occur  with improper specimen collection / handling, submission of specimen other than nasopharyngeal swab, presence of viral mutation(s) within the areas targeted by this assay, and inadequate number of viral copies (<250 copies / mL). A negative result must be combined with clinical observations, patient history, and epidemiological information.  Fact Sheet for Patients:   RoadLapTop.co.za  Fact Sheet for Healthcare Providers: http://kim-miller.com/  This test is not yet approved or  cleared by the [place] FDA and has been authorized for detection and/or diagnosis of SARS-CoV-2 by FDA under an Emergency Use Authorization (EUA).  This EUA will remain in effect (meaning this test can be used) for the duration of the COVID-19 declaration under Section 564(b)(1) of the Act, 21 U.S.C. section 360bbb-3(b)(1), unless the authorization is terminated or revoked sooner.  Performed at [facility], [address], [place], [place] [address]   Respiratory (~20 pathogens)  panel by PCR     Status: None   Collection Time: 09/19/22  1:31 PM   Specimen: Nasopharyngeal Swab; Respiratory  Result Value Ref Range Status   Adenovirus NOT DETECTED NOT DETECTED Final   Coronavirus 229E NOT DETECTED NOT DETECTED Final    Comment: (NOTE) The Coronavirus on the Respiratory Panel, DOES NOT test for the novel  Coronavirus (2019 nCoV)    Coronavirus HKU1 NOT DETECTED NOT DETECTED Final   Coronavirus NL63 NOT DETECTED NOT DETECTED Final   Coronavirus OC43 NOT DETECTED NOT DETECTED Final   Metapneumovirus NOT DETECTED NOT DETECTED Final   Rhinovirus / Enterovirus NOT DETECTED NOT DETECTED Final   Influenza A NOT DETECTED NOT DETECTED Final   Influenza B NOT DETECTED NOT DETECTED Final   Parainfluenza Virus 1 NOT DETECTED NOT DETECTED Final   Parainfluenza Virus 2 NOT DETECTED NOT DETECTED Final   Parainfluenza Virus 3 NOT DETECTED NOT DETECTED Final    Parainfluenza Virus 4 NOT DETECTED NOT DETECTED Final   Respiratory Syncytial Virus NOT DETECTED NOT DETECTED Final   Bordetella pertussis NOT DETECTED NOT DETECTED Final   Bordetella Parapertussis NOT DETECTED NOT DETECTED Final   Chlamydophila pneumoniae NOT DETECTED NOT DETECTED Final   Mycoplasma pneumoniae NOT DETECTED NOT DETECTED Final    Comment: Performed at [facility], [address], [place], [place] [address]         Radiology Studies: DG Chest Port 1 View  Result Date: 09/23/2022 CLINICAL DATA:  Pulmonary infiltrates on chest x-ray. EXAM: PORTABLE CHEST 1 VIEW COMPARISON:  Chest radiograph 09/17/2022 and CT 09/18/2022 FINDINGS: The cardiac silhouette remains enlarged. Aortic atherosclerosis is noted. Widespread, predominantly interstitial type densities in both lungs have mildly improved from the prior radiograph. There may be a trace right pleural effusion. No pneumothorax is identified. IMPRESSION: Mildly improved bilateral lung infiltrates. Electronically Signed   By: Sebastian Ache M.D.   On: 09/23/2022 11:01        Scheduled Meds:  acidophilus  1 capsule Oral Daily   apixaban  2.5 mg Oral BID   cyanocobalamin  1,000 mcg Oral Daily   diltiazem  360 mg Oral Daily   feeding supplement  237 mL Oral BID BM   liver oil-zinc oxide   Topical TID   metoprolol succinate  100 mg Oral Daily   multivitamins with iron  1 tablet Oral Daily   pantoprazole  40 mg Oral Daily   polyethylene glycol  17 g Oral Daily   predniSONE  50 mg Oral Q breakfast   Followed by   Melene Muller ON 10/01/2022] predniSONE  45 mg Oral Q breakfast   Followed by   Melene Muller ON 10/08/2022] predniSONE  40 mg Oral Q breakfast   Followed by   Melene Muller ON 10/15/2022] predniSONE  35 mg Oral Q breakfast   Followed by   Melene Muller ON 10/22/2022] predniSONE  30 mg Oral Q breakfast   Followed by   Melene Muller ON 10/29/2022] predniSONE  25 mg Oral Q breakfast   Followed by   Melene Muller ON 11/05/2022] predniSONE  20 mg Oral Q  breakfast   Followed by   Melene Muller ON 11/12/2022] predniSONE  15 mg Oral Q breakfast   Followed by   Melene Muller ON 11/19/2022] predniSONE  10 mg Oral Q breakfast   Followed by   Melene Muller ON 11/26/2022] predniSONE  5 mg Oral Q breakfast   rosuvastatin  10 mg Oral Daily   senna-docusate  1 tablet Oral BID   sodium  chloride flush  3 mL Intravenous Q12H   Continuous Infusions:  sodium chloride 10 mL/hr at 09/22/22 0450   sodium chloride     doxycycline (VIBRAMYCIN) IV 100 mg (09/24/22 1000)     LOS: 37 days     Silvano Bilis, MD [facility]   If 7PM-7AM, please contact night-coverage  09/24/2022, 4:37 PM

## 2022-09-24 NOTE — Progress Notes (Signed)
PULMONOLOGY         Date: 09/24/2022,   MRN# [id number] Brett Riley 1935-06-30     AdmissionWeight: 91.9 kg                 CurrentWeight: 77.1 kg  Referring provider: Dr End   CHIEF COMPLAINT:   Acute on chronic hypoxemic respiratory failure   HISTORY OF PRESENT ILLNESS   This is a 87 year old with a history of skin cancer, carotid disease, celiac artery stenosis, coronary disease status post cardiac cath with findings multivessel disease and recommendation for coronary artery bypass, sensorineural hearing loss, systolic CHF, dyslipidemia, nephrolithiasis, hip osteoarthritis, atrial fibrillation who came into the hospital 32 days ago for acute on chronic hypoxemic respiratory failure after having an admission in May to June 2024 for aortic ulcer repair which was complicated by sepsis and Staph aureus bacteremia as well as osteomyelitis of the spine.  Patient completed full course of antimicrobials in July 2024.  He was found to be hypoxemic and initially required BiPAP diuresed and had chest physiotherapy with improvement to 2 to 3 L nasal cannula.  Was also seen by cardiology at medical management diuresed approximately 6 L which helped, however while waiting for LTAC approval apparently progressively became more dyspneic and hypoxemic despite ongoing diuresis.  PCCM consultation was placed for increased O2 requirement with acute on chronic hypoxemic respiratory failure.  09/20/22- patient remains hypoxemic, this am able to eat breakfast with help from wife at bedside.  Inflammatory biomarkers show marked elevation, this generally is not seen with fluid imbalance.  Viral workup was negative however he may still have some viral LRTI that is not screened on our platform or other cause of inflammation.  There is definite bronchiectasis on CT chest with ground glass infiltrates with antero posterior gradient seen with ARDS vs acute exacerbation of ILD/bronchiectasis.  Plan to  restart systemic steroids with solumedrol today at 40mg  IV.   09/21/22- patient is improved today.  He is down to 5L/min.  He shares breathing is less labored.  He was able to sleep better due to less distress.  He may need Lung or Kidney biopsy to evaluate underlying connective tissue disease which may explain his chronic bronchiectatic lungs and possibly involvement of kidney as well. He seems to be responding to steroids.  Will keep dose of IV solumedrol at 40mg  daily for now.   09/22/22- patient is improved today, he's down to 4L/min.  He feels stronger but far from baseline.  He is willing to work with PT.  Stable CKD overnight without worsening.  He is mentating well, eating well, taking his breathing treatments as prescribed. Wife at bedside helping paitent. He had loose stools this am after receiving stool softner.   I have stopped doxycycline PO high risk for pill induced esophagitis while bedbound.  No bleeding except very scant epistaxis in am while on eliquis.  He is sleeping better. Solumedrol at 40IV today.  CXR in am ordered.    09/24/22- patient is imrpoved, he is breathing better non labored on 3L/min.  Renal function has also improved.  We are reducing steroids to PO.  He still has not been able to ambulate may need prolonged rehab. Slow taper of steroids over next 2 months   PAST MEDICAL HISTORY   Past Medical History:  Diagnosis Date   Cancer (HCC)    skin cancer basal   Carotid arterial disease (HCC)    a. 02/2016 L CEA; b.  01/2017 U/S: patent LICA, 1-39% RICA.   Celiac artery stenosis (HCC)    Coronary artery disease    a. 01/2016 MV: mild apical/basal inferior, apical lateral, mid anterolateral, and mid inferolateral ischemia. EF 57%; b. 02/2016 Cath: LM 40ost, LAD 70p/m, 20d, D1 95 (small), LCX nl, RCA 7m, RPDA 90 (small), EF 55-65%-->med Rx. Rec CABG for recurrent symptoms.   Hearing loss    History of echocardiogram    a. 02/2016 Echo: EF 50-55%, no rwma, mild MR.    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   Osteoarthritis, hip, bilateral    Left > Right     SURGICAL HISTORY   Past Surgical History:  Procedure Laterality Date   AORTIC INTERVENTION N/A 07/24/2022   Procedure: AORTIC INTERVENTION;  Surgeon: Renford Dills, MD;  Location: [facility];  Service: Cardiovascular;  Laterality: N/A;   CARDIAC CATHETERIZATION N/A 01/19/2016   Procedure: Left Heart Cath and Coronary Angiography;  Surgeon: Iran Ouch, MD;  Location: [facility];  Service: Cardiovascular;  Laterality: N/A;   COLONOSCOPY     CYSTOSCOPY/URETEROSCOPY/HOLMIUM LASER/STENT PLACEMENT Right 04/13/2021   Procedure: CYSTOSCOPY/URETEROSCOPY/HOLMIUM LASER/STENT PLACEMENT;  Surgeon: Sondra Come, MD;  Location: [facility];  Service: Urology;  Laterality: Right;   ENDARTERECTOMY Left 02/15/2016   Procedure: ENDARTERECTOMY CAROTID;  Surgeon: Annice Needy, MD;  Location: [facility];  Service: Vascular;  Laterality: Left;   RIGHT HEART CATH N/A 09/19/2022   Procedure: RIGHT HEART CATH;  Surgeon: Yvonne Kendall, MD;  Location: [facility];  Service: Cardiovascular;  Laterality: N/A;   TEE WITHOUT CARDIOVERSION N/A 08/05/2022   Procedure: TRANSESOPHAGEAL ECHOCARDIOGRAM;  Surgeon: Debbe Odea, MD;  Location: [facility];  Service: Cardiovascular;  Laterality: N/A;     FAMILY HISTORY   Family History  Problem Relation Age of Onset   Stroke Mother    Heart disease Father        MI   Heart attack Father    Breast cancer Sister    Colon cancer Neg Hx    Prostate cancer Neg Hx      SOCIAL HISTORY   Social History   Tobacco Use   Smoking status: Never   Smokeless tobacco: Never  Vaping Use   Vaping status: Never Used  Substance Use Topics   Alcohol use: No    Alcohol/week: 0.0 standard drinks of alcohol   Drug use: No     MEDICATIONS    Home Medication:   Current Outpatient Rx   Order #: [id number] Class: Normal   Order #: [id number] Class: Normal   Order #: [id number] Class: Normal    Current Medication:  Current Facility-Administered Medications:    0.9 %  sodium chloride infusion, , Intravenous, PRN, End, Christopher, MD, Last Rate: 10 mL/hr at 09/22/22 0450, Infusion Verify at 09/22/22 0450   0.9 %  sodium chloride infusion, 250 mL, Intravenous, PRN, End, Cristal Deer, MD   acetaminophen (TYLENOL) tablet 650 mg, 650 mg, Oral, Q6H PRN, End, Christopher, MD, 650 mg at 09/13/22 1713   acidophilus (RISAQUAD) capsule 1 capsule, 1 capsule, Oral, Daily, End, Christopher, MD, 1 capsule at 09/23/22 0947   albuterol (PROVENTIL) (2.5 MG/3ML) 0.083% nebulizer solution 2.5 mg, 2.5 mg, Nebulization, Q4H PRN, End, Christopher, MD, 2.5 mg at 09/16/22 0739   apixaban (ELIQUIS) tablet 2.5 mg, 2.5 mg, Oral, BID, End, Christopher, MD, 2.5 mg at 09/23/22 2116  bisacodyl (DULCOLAX) suppository 10 mg, 10 mg, Rectal, Daily PRN, End, Cristal Deer, MD   cyanocobalamin (VITAMIN B12) tablet 1,000 mcg, 1,000 mcg, Oral, Daily, End, Christopher, MD, 1,000 mcg at 09/23/22 0947   dextromethorphan-guaiFENesin (MUCINEX DM) 30-600 MG per 12 hr tablet 1 tablet, 1 tablet, Oral, BID PRN, End, Cristal Deer, MD, 1 tablet at 09/21/22 2122   diltiazem (CARDIZEM CD) 24 hr capsule 360 mg, 360 mg, Oral, Daily, End, Cristal Deer, MD, 360 mg at 09/23/22 0947   doxycycline (VIBRAMYCIN) 100 mg in sodium chloride 0.9 % 250 mL IVPB, 100 mg, Intravenous, Q12H, Wouk, Wilfred Curtis, MD, Last Rate: 125 mL/hr at 09/23/22 2119, 100 mg at 09/23/22 2119   feeding supplement (ENSURE ENLIVE / ENSURE PLUS) liquid 237 mL, 237 mL, Oral, BID BM, End, Christopher, MD, 237 mL at 09/21/22 1029   liver oil-zinc oxide (DESITIN) 40 % ointment, , Topical, TID, End, Christopher, MD, Given at 09/23/22 2117   melatonin tablet 2.5 mg, 2.5 mg, Oral, QHS PRN, End, Cristal Deer, MD, 2.5 mg at 09/23/22 2116   metoprolol succinate  (TOPROL-XL) 24 hr tablet 100 mg, 100 mg, Oral, Daily, End, Cristal Deer, MD, 100 mg at 09/23/22 0947   multivitamins with iron tablet 1 tablet, 1 tablet, Oral, Daily, End, Cristal Deer, MD, 1 tablet at 09/23/22 1451   ondansetron (ZOFRAN) injection 4 mg, 4 mg, Intravenous, Q8H PRN, End, Christopher, MD, 4 mg at 09/17/22 1754   pantoprazole (PROTONIX) EC tablet 40 mg, 40 mg, Oral, Daily, End, Cristal Deer, MD, 40 mg at 09/23/22 0948   polyethylene glycol (MIRALAX / GLYCOLAX) packet 17 g, 17 g, Oral, Daily, End, Christopher, MD, 17 g at 09/22/22 0955   predniSONE (DELTASONE) tablet 50 mg, 50 mg, Oral, Q breakfast **FOLLOWED BY** [START ON 10/01/2022] predniSONE (DELTASONE) tablet 45 mg, 45 mg, Oral, Q breakfast **FOLLOWED BY** [START ON 10/08/2022] predniSONE (DELTASONE) tablet 40 mg, 40 mg, Oral, Q breakfast **FOLLOWED BY** [START ON 10/15/2022] predniSONE (DELTASONE) tablet 35 mg, 35 mg, Oral, Q breakfast **FOLLOWED BY** [START ON 10/22/2022] predniSONE (DELTASONE) tablet 30 mg, 30 mg, Oral, Q breakfast **FOLLOWED BY** [START ON 10/29/2022] predniSONE (DELTASONE) tablet 25 mg, 25 mg, Oral, Q breakfast **FOLLOWED BY** [START ON 11/05/2022] predniSONE (DELTASONE) tablet 20 mg, 20 mg, Oral, Q breakfast **FOLLOWED BY** [START ON 11/12/2022] predniSONE (DELTASONE) tablet 15 mg, 15 mg, Oral, Q breakfast **FOLLOWED BY** [START ON 11/19/2022] predniSONE (DELTASONE) tablet 10 mg, 10 mg, Oral, Q breakfast **FOLLOWED BY** [START ON 11/26/2022] predniSONE (DELTASONE) tablet 5 mg, 5 mg, Oral, Q breakfast, Coulter, Carolyn, RPH   rosuvastatin (CRESTOR) tablet 10 mg, 10 mg, Oral, Daily, End, Christopher, MD, 10 mg at 09/23/22 0947   senna-docusate (Senokot-S) tablet 1 tablet, 1 tablet, Oral, BID, End, Christopher, MD, 1 tablet at 09/23/22 2116   simethicone (MYLICON) chewable tablet 80 mg, 80 mg, Oral, QID PRN, End, Christopher, MD, 80 mg at 08/20/22 2121   sodium chloride (OCEAN) 0.65 % nasal spray 1 spray, 1 spray, Each Nare, PRN,  End, Christopher, MD, 1 spray at 08/27/22 0319   sodium chloride flush (NS) 0.9 % injection 3 mL, 3 mL, Intravenous, Q12H, End, Christopher, MD, 3 mL at 09/23/22 2117   sodium chloride flush (NS) 0.9 % injection 3 mL, 3 mL, Intravenous, PRN, End, Cristal Deer, MD   traMADol Janean Sark) tablet 50 mg, 50 mg, Oral, Q6H PRN, End, Christopher, MD, 50 mg at 09/23/22 2116    ALLERGIES   Patient has no known allergies.     REVIEW OF SYSTEMS    Review  of Systems:  Gen:  Denies  fever, sweats, chills weigh loss  HEENT: Denies blurred vision, double vision, ear pain, eye pain, hearing loss, nose bleeds, sore throat Cardiac:  No dizziness, chest pain or heaviness, chest tightness,edema Resp:   reports dyspnea chronically  Gi: Denies swallowing difficulty, stomach pain, nausea or vomiting, diarrhea, constipation, bowel incontinence Gu:  Denies bladder incontinence, burning urine Ext:   Denies Joint pain, stiffness or swelling Skin: Denies  skin rash, easy bruising or bleeding or hives Endoc:  Denies polyuria, polydipsia , polyphagia or weight change Psych:   Denies depression, insomnia or hallucinations   Other:  All other systems negative   VS: BP (!) 138/108 (BP Location: Right Arm)   Pulse 68   Temp (!) 97.5 F (36.4 C)   Resp 18   Ht 6' (1.829 m)   Wt 77.1 kg   SpO2 94%   BMI 23.05 kg/m      PHYSICAL EXAM    GENERAL:NAD, no fevers, chills, no weakness no fatigue HEAD: Normocephalic, atraumatic.  EYES: Pupils equal, round, reactive to light. Extraocular muscles intact. No scleral icterus.  MOUTH: Moist mucosal membrane. Dentition intact. No abscess noted.  EAR, NOSE, THROAT: Clear without exudates. No external lesions.  NECK: Supple. No thyromegaly. No nodules. No JVD.  PULMONARY: decreased breath sounds with mild rhonchi worse at bases bilaterally.  CARDIOVASCULAR: S1 and S2. Regular rate and rhythm. No murmurs, rubs, or gallops. No edema. Pedal pulses 2+ bilaterally.   GASTROINTESTINAL: Soft, nontender, nondistended. No masses. Positive bowel sounds. No hepatosplenomegaly.  MUSCULOSKELETAL: No swelling, clubbing, or edema. Range of motion full in all extremities.  NEUROLOGIC: Cranial nerves II through XII are intact. No gross focal neurological deficits. Sensation intact. Reflexes intact.  SKIN: No ulceration, lesions, rashes, or cyanosis. Skin warm and dry. Turgor intact.  PSYCHIATRIC: Mood, affect within normal limits. The patient is awake, alert and oriented x 3. Insight, judgment intact.       IMAGING     ASSESSMENT/PLAN    Acute on chronic hypoxemic respiratory failure - compared to previous CT chest 09/05/22 infiltrates have worsened with interstitial infiltrates bilaterally and bronchiectasis in the background.  -CRP and ESR severely elevated indicative of underlying active systemic process.   Viral workup thus far negative, starting steroids via IV today. -s/p RHC - appreciate cardiology - right heart metrics are within reference range without PH -COVID and RVP reswabbing- negative  -ON  zosyn and bactrim  -there is no leukocytosis and patient does not appear to be septic clinically, consider consultation with ID to stop antimicrobials to help preserve renal function/Cdiff devt -previous serology for autoimmune disease was negative but he may need biopsy of lung/kidney for evaluation of vasculitis or connective tissue etiology      Thank you for allowing me to participate in the care of this patient.   Patient/Family are satisfied with care plan and all questions have been answered.    Provider disclosure: Patient with at least one acute or chronic illness or injury that poses a threat to life or bodily function and is being managed actively during this encounter.  All of the below services have been performed independently by signing provider:  review of prior documentation from internal and or external health records.  Review of  previous and current lab results.  Interview and comprehensive assessment during patient visit today. Review of current and previous chest radiographs/CT scans. Discussion of management and test interpretation with health care team and patient/family.  This document was prepared using Dragon voice recognition software and may include unintentional dictation errors.     Vida Rigger, M.D.  Division of Pulmonary & Critical Care Medicine

## 2022-09-24 NOTE — TOC Progression Note (Addendum)
Transition of Care [facility]) - Progression Note    Patient Details  Name: Brett Riley MRN: [id number] Date of Birth: [birth year]  Transition of [facility] CM/SW Contact  Darolyn Rua, [place] Phone Number: 09/24/2022, 3:27 PM  Clinical Narrative:     CSW spoke with [facility] admissions who reports no male beds avail today, potential for tomorrow. They report they will let CSW know tomorrow 7/24.  Expected Discharge Plan: Skilled Nursing Facility Barriers to Discharge: Continued Medical Work up, English as a second language teacher  Expected Discharge Plan and Services     Post Acute Care Choice: Skilled Nursing Facility Living arrangements for the past 2 months: Single Family Home                                       Social Determinants of Health (SDOH) Interventions SDOH Screenings   Food Insecurity: No Food Insecurity (08/18/2022)  Housing: Low Risk  (08/18/2022)  Transportation Needs: No Transportation Needs (08/18/2022)  Utilities: Not At Risk (08/18/2022)  Depression (PHQ2-9): Low Risk  (06/06/2022)  Financial Resource Strain: Low Risk  (08/29/2020)  Physical Activity: Insufficiently Active (08/29/2020)  Social Connections: Unknown (08/29/2020)  Stress: No Stress Concern Present (08/29/2020)  Tobacco Use: Low Risk  (09/19/2022)    Readmission Risk Interventions     No data to display

## 2022-09-25 DIAGNOSIS — E785 Hyperlipidemia, unspecified: Secondary | ICD-10-CM | POA: Diagnosis not present

## 2022-09-25 DIAGNOSIS — R531 Weakness: Secondary | ICD-10-CM | POA: Diagnosis not present

## 2022-09-25 DIAGNOSIS — T462X5A Adverse effect of other antidysrhythmic drugs, initial encounter: Secondary | ICD-10-CM | POA: Diagnosis not present

## 2022-09-25 DIAGNOSIS — R0902 Hypoxemia: Secondary | ICD-10-CM | POA: Diagnosis not present

## 2022-09-25 DIAGNOSIS — I4819 Other persistent atrial fibrillation: Secondary | ICD-10-CM | POA: Diagnosis not present

## 2022-09-25 DIAGNOSIS — R278 Other lack of coordination: Secondary | ICD-10-CM | POA: Diagnosis not present

## 2022-09-25 DIAGNOSIS — G9341 Metabolic encephalopathy: Secondary | ICD-10-CM | POA: Diagnosis not present

## 2022-09-25 DIAGNOSIS — E872 Acidosis, unspecified: Secondary | ICD-10-CM | POA: Diagnosis not present

## 2022-09-25 DIAGNOSIS — J189 Pneumonia, unspecified organism: Secondary | ICD-10-CM | POA: Diagnosis not present

## 2022-09-25 DIAGNOSIS — Z7401 Bed confinement status: Secondary | ICD-10-CM | POA: Diagnosis not present

## 2022-09-25 DIAGNOSIS — B9561 Methicillin susceptible Staphylococcus aureus infection as the cause of diseases classified elsewhere: Secondary | ICD-10-CM | POA: Diagnosis not present

## 2022-09-25 DIAGNOSIS — I5033 Acute on chronic diastolic (congestive) heart failure: Secondary | ICD-10-CM | POA: Diagnosis not present

## 2022-09-25 DIAGNOSIS — N183 Chronic kidney disease, stage 3 unspecified: Secondary | ICD-10-CM | POA: Diagnosis not present

## 2022-09-25 DIAGNOSIS — R600 Localized edema: Secondary | ICD-10-CM | POA: Diagnosis not present

## 2022-09-25 DIAGNOSIS — W19XXXA Unspecified fall, initial encounter: Secondary | ICD-10-CM | POA: Diagnosis not present

## 2022-09-25 DIAGNOSIS — J9600 Acute respiratory failure, unspecified whether with hypoxia or hypercapnia: Secondary | ICD-10-CM | POA: Diagnosis not present

## 2022-09-25 DIAGNOSIS — R2689 Other abnormalities of gait and mobility: Secondary | ICD-10-CM | POA: Diagnosis not present

## 2022-09-25 DIAGNOSIS — D649 Anemia, unspecified: Secondary | ICD-10-CM | POA: Diagnosis not present

## 2022-09-25 DIAGNOSIS — J90 Pleural effusion, not elsewhere classified: Secondary | ICD-10-CM | POA: Diagnosis not present

## 2022-09-25 DIAGNOSIS — J9601 Acute respiratory failure with hypoxia: Secondary | ICD-10-CM | POA: Diagnosis not present

## 2022-09-25 DIAGNOSIS — M4620 Osteomyelitis of vertebra, site unspecified: Secondary | ICD-10-CM | POA: Diagnosis not present

## 2022-09-25 DIAGNOSIS — I482 Chronic atrial fibrillation, unspecified: Secondary | ICD-10-CM | POA: Diagnosis not present

## 2022-09-25 DIAGNOSIS — M6281 Muscle weakness (generalized): Secondary | ICD-10-CM | POA: Diagnosis not present

## 2022-09-25 DIAGNOSIS — M4626 Osteomyelitis of vertebra, lumbar region: Secondary | ICD-10-CM | POA: Diagnosis not present

## 2022-09-25 DIAGNOSIS — R7881 Bacteremia: Secondary | ICD-10-CM | POA: Diagnosis not present

## 2022-09-25 DIAGNOSIS — I10 Essential (primary) hypertension: Secondary | ICD-10-CM | POA: Diagnosis not present

## 2022-09-25 DIAGNOSIS — I480 Paroxysmal atrial fibrillation: Secondary | ICD-10-CM | POA: Diagnosis not present

## 2022-09-25 DIAGNOSIS — J984 Other disorders of lung: Secondary | ICD-10-CM | POA: Diagnosis not present

## 2022-09-25 DIAGNOSIS — E871 Hypo-osmolality and hyponatremia: Secondary | ICD-10-CM | POA: Diagnosis not present

## 2022-09-25 DIAGNOSIS — I5030 Unspecified diastolic (congestive) heart failure: Secondary | ICD-10-CM | POA: Diagnosis not present

## 2022-09-25 DIAGNOSIS — N179 Acute kidney failure, unspecified: Secondary | ICD-10-CM | POA: Diagnosis not present

## 2022-09-25 DIAGNOSIS — Z743 Need for continuous supervision: Secondary | ICD-10-CM | POA: Diagnosis not present

## 2022-09-25 DIAGNOSIS — R809 Proteinuria, unspecified: Secondary | ICD-10-CM | POA: Diagnosis not present

## 2022-09-25 LAB — CBC
HCT: 30.4 % — ABNORMAL LOW (ref 39.0–52.0)
Hemoglobin: 9.6 g/dL — ABNORMAL LOW (ref 13.0–17.0)
MCH: 28.7 pg (ref 26.0–34.0)
MCHC: 31.6 g/dL (ref 30.0–36.0)
MCV: 91 fL (ref 80.0–100.0)
Platelets: 259 10*3/uL (ref 150–400)
RBC: 3.34 MIL/uL — ABNORMAL LOW (ref 4.22–5.81)
RDW: 15.1 % (ref 11.5–15.5)
WBC: 7.6 10*3/uL (ref 4.0–10.5)
nRBC: 0 % (ref 0.0–0.2)

## 2022-09-25 LAB — BASIC METABOLIC PANEL WITH GFR
Anion gap: 10 (ref 5–15)
BUN: 92 mg/dL — ABNORMAL HIGH (ref 8–23)
CO2: 26 mmol/L (ref 22–32)
Calcium: 8.4 mg/dL — ABNORMAL LOW (ref 8.9–10.3)
Chloride: 106 mmol/L (ref 98–111)
Creatinine, Ser: 3.39 mg/dL — ABNORMAL HIGH (ref 0.61–1.24)
GFR, Estimated: 17 mL/min — ABNORMAL LOW
Glucose, Bld: 116 mg/dL — ABNORMAL HIGH (ref 70–99)
Potassium: 3.3 mmol/L — ABNORMAL LOW (ref 3.5–5.1)
Sodium: 142 mmol/L (ref 135–145)

## 2022-09-25 MED ORDER — MELATONIN 5 MG PO TABS: 2.5000 mg | ORAL_TABLET | Freq: Every evening | ORAL | 0 refills | Status: DC | PRN

## 2022-09-25 MED ORDER — PREDNISONE 5 MG PO TABS: ORAL_TABLET | ORAL | 0 refills | Status: DC

## 2022-09-25 MED ORDER — SULFAMETHOXAZOLE-TRIMETHOPRIM 800-160 MG PO TABS: 1.0000 | ORAL_TABLET | ORAL | 0 refills | Status: DC

## 2022-09-25 MED ORDER — DOXYCYCLINE HYCLATE 50 MG PO CAPS: 100.0000 mg | ORAL_CAPSULE | Freq: Two times a day (BID) | ORAL | 0 refills | Status: AC

## 2022-09-25 MED ORDER — APIXABAN 2.5 MG PO TABS: 2.5000 mg | ORAL_TABLET | Freq: Two times a day (BID) | ORAL | 2 refills | Status: DC

## 2022-09-25 MED ORDER — RISAQUAD PO CAPS: 1.0000 | ORAL_CAPSULE | Freq: Every day | ORAL | 0 refills | Status: AC

## 2022-09-25 NOTE — Progress Notes (Signed)
[facility]  ROUNDING NOTE   Subjective:   Patient sitting up in bed, smiling Wife at bedside Discharge scheduled for later today Alert and oriented Tolerating small meals without nausea or vomiting Room air Lower extremity remains but improved  Creatinine 3.39 UOP in past 24 hours   Objective:  Vital signs in last 24 hours:  Temp:  [97.8 F (36.6 C)-98 F (36.7 C)] 97.8 F (36.6 C) (07/24 1240) Pulse Rate:  [64-84] 72 (07/24 1240) Resp:  [16-20] 20 (07/24 1240) BP: (113-158)/(67-83) 158/82 (07/24 1240) SpO2:  [87 %-100 %] 94 % (07/24 1240) Weight:  [77 kg] 77 kg (07/24 0400)  Weight change: -2.7 kg Filed Weights   09/23/22 0711 09/24/22 0428 09/25/22 0400  Weight: 79.7 kg 77.1 kg 77 kg    Intake/Output: I/O last 3 completed shifts: In: 720 [P.O.:720] Out: 2600 [Urine:2600]   Intake/Output this shift:  Total I/O In: 240 [P.O.:240] Out: 800 [Urine:800]  Physical Exam: General: NAD, sitting up in bed  Head: Normocephalic, atraumatic. Moist oral mucosal membranes  Eyes: Anicteric  Lungs:  Diminished in bases, room air  Heart: Regular rate and rhythm  Abdomen:  +distended, nontender  Extremities:  1+ peripheral edema.  TED hose  Neurologic: Alert, moving all four extremities  Skin: No lesions  Access: none    Basic Metabolic Panel: Recent Labs  Lab 09/20/22 0914 09/21/22 0521 09/22/22 0629 09/23/22 0446 09/24/22 0444 09/25/22 0604  NA 143 143 141 141 142 142  K 3.1* 4.1 3.9 3.9 3.9 3.3*  CL 99 102 102 101 102 106  CO2 28 28 27 25 27 26   GLUCOSE 124* 122* 128* 126* 125* 116*  BUN 99* 102* 98* 102* 100* 92*  CREATININE 4.25* 4.38* 4.31* 4.12* 3.71* 3.39*  CALCIUM 8.9 8.7* 8.8* 8.7* 8.6* 8.4*  MG 1.9  --   --   --   --   --     Liver Function Tests: Recent Labs  Lab 09/18/22 1436  ALBUMIN 2.5*    No results for input(s): "LIPASE", "AMYLASE" in the last 168 hours. No results for input(s): "AMMONIA" in the last 168  hours.  CBC: Recent Labs  Lab 09/18/22 1436 09/19/22 0630 09/19/22 1659 09/20/22 0914 09/22/22 0629 09/25/22 0604  WBC 6.4 6.4  --  5.5 5.3 7.6  HGB 8.0* 8.3* 8.2* 8.5* 8.1* 9.6*  HCT 25.2* 25.9* 24.0* 27.4* 25.7* 30.4*  MCV 92.6 90.9  --  92.3 90.8 91.0  PLT 152 165  --  174 201 259    Cardiac Enzymes: No results for input(s): "CKTOTAL", "CKMB", "CKMBINDEX", "TROPONINI" in the last 168 hours.   BNP: Invalid input(s): "POCBNP"  CBG: No results for input(s): "GLUCAP" in the last 168 hours.   Microbiology: Results for orders placed or performed during the hospital encounter of 08/18/22  Blood culture (routine x 2)     Status: None   Collection Time: 08/18/22 10:45 AM   Specimen: BLOOD LEFT ARM  Result Value Ref Range Status   Specimen Description BLOOD LEFT ARM  Final   Special Requests   Final    BOTTLES DRAWN AEROBIC AND ANAEROBIC Blood Culture adequate volume   Culture   Final    NO GROWTH 5 DAYS Performed at [facility], [address] [place], [place] [address]    Report Status 08/23/2022 FINAL  Final  Resp Panel by RT-PCR (Flu A&B, Covid) Anterior Nasal Swab     Status: None   Collection Time: 08/18/22  10:46 AM   Specimen: Anterior Nasal Swab  Result Value Ref Range Status   SARS Coronavirus 2 by RT PCR NEGATIVE NEGATIVE Final    Comment: (NOTE) SARS-CoV-2 target nucleic acids are NOT DETECTED.  The SARS-CoV-2 RNA is generally detectable in upper respiratory specimens during the acute phase of infection. The lowest concentration of SARS-CoV-2 viral copies this assay can detect is 138 copies/mL. A negative result does not preclude SARS-Cov-2 infection and should not be used as the sole basis for treatment or other patient management decisions. A negative result may occur with  improper specimen collection/handling, submission of specimen other than nasopharyngeal swab, presence of viral mutation(s) within the areas targeted by this assay, and  inadequate number of viral copies(<138 copies/mL). A negative result must be combined with clinical observations, patient history, and epidemiological information. The expected result is Negative.  Fact Sheet for Patients:  BloggerCourse.com  Fact Sheet for Healthcare Providers:  SeriousBroker.it  This test is no t yet approved or cleared by the [place] FDA and  has been authorized for detection and/or diagnosis of SARS-CoV-2 by FDA under an Emergency Use Authorization (EUA). This EUA will remain  in effect (meaning this test can be used) for the duration of the COVID-19 declaration under Section 564(b)(1) of the Act, 21 U.S.C.section 360bbb-3(b)(1), unless the authorization is terminated  or revoked sooner.       Influenza A by PCR NEGATIVE NEGATIVE Final   Influenza B by PCR NEGATIVE NEGATIVE Final    Comment: (NOTE) The Xpert Xpress SARS-CoV-2/FLU/RSV plus assay is intended as an aid in the diagnosis of influenza from Nasopharyngeal swab specimens and should not be used as a sole basis for treatment. Nasal washings and aspirates are unacceptable for Xpert Xpress SARS-CoV-2/FLU/RSV testing.  Fact Sheet for Patients: BloggerCourse.com  Fact Sheet for Healthcare Providers: SeriousBroker.it  This test is not yet approved or cleared by the [place] FDA and has been authorized for detection and/or diagnosis of SARS-CoV-2 by FDA under an Emergency Use Authorization (EUA). This EUA will remain in effect (meaning this test can be used) for the duration of the COVID-19 declaration under Section 564(b)(1) of the Act, 21 U.S.C. section 360bbb-3(b)(1), unless the authorization is terminated or revoked.  Performed at [facility], [address] [place], [place] [address]   Culture, blood (Routine X 2) w Reflex to ID Panel     Status: None   Collection Time:  08/18/22  9:19 PM   Specimen: BLOOD LEFT ARM  Result Value Ref Range Status   Specimen Description BLOOD LEFT ARM  Final   Special Requests   Final    BOTTLES DRAWN AEROBIC AND ANAEROBIC Blood Culture adequate volume   Culture   Final    NO GROWTH 5 DAYS Performed at [facility], [address] [place], [place] [address]    Report Status 08/23/2022 FINAL  Final  Respiratory (~20 pathogens) panel by PCR     Status: None   Collection Time: 08/21/22  1:50 PM   Specimen: Nasopharyngeal Swab; Respiratory  Result Value Ref Range Status   Adenovirus NOT DETECTED NOT DETECTED Final   Coronavirus 229E NOT DETECTED NOT DETECTED Final    Comment: (NOTE) The Coronavirus on the Respiratory Panel, DOES NOT test for the novel  Coronavirus (2019 nCoV)    Coronavirus HKU1 NOT DETECTED NOT DETECTED Final   Coronavirus NL63 NOT DETECTED NOT DETECTED Final   Coronavirus OC43 NOT DETECTED NOT DETECTED Final   Metapneumovirus  NOT DETECTED NOT DETECTED Final   Rhinovirus / Enterovirus NOT DETECTED NOT DETECTED Final   Influenza A NOT DETECTED NOT DETECTED Final   Influenza B NOT DETECTED NOT DETECTED Final   Parainfluenza Virus 1 NOT DETECTED NOT DETECTED Final   Parainfluenza Virus 2 NOT DETECTED NOT DETECTED Final   Parainfluenza Virus 3 NOT DETECTED NOT DETECTED Final   Parainfluenza Virus 4 NOT DETECTED NOT DETECTED Final   Respiratory Syncytial Virus NOT DETECTED NOT DETECTED Final   Bordetella pertussis NOT DETECTED NOT DETECTED Final   Bordetella Parapertussis NOT DETECTED NOT DETECTED Final   Chlamydophila pneumoniae NOT DETECTED NOT DETECTED Final   Mycoplasma pneumoniae NOT DETECTED NOT DETECTED Final    Comment: Performed at [facility], [address] [place], [place] [address]  Culture, blood (Routine X 2) w Reflex to ID Panel     Status: None   Collection Time: 08/21/22  8:58 PM   Specimen: BLOOD  Result Value Ref Range Status   Specimen Description BLOOD LEFT  WRIST  Final   Special Requests   Final    BOTTLES DRAWN AEROBIC AND ANAEROBIC Blood Culture adequate volume   Culture   Final    NO GROWTH 5 DAYS Performed at [facility], [address] [place], [place] [address]    Report Status 08/26/2022 FINAL  Final  Culture, blood (Routine X 2) w Reflex to ID Panel     Status: None   Collection Time: 08/21/22 11:01 PM   Specimen: BLOOD  Result Value Ref Range Status   Specimen Description BLOOD BLOOD LEFT ARM  Final   Special Requests   Final    BOTTLES DRAWN AEROBIC AND ANAEROBIC Blood Culture adequate volume   Culture   Final    NO GROWTH 5 DAYS Performed at [facility], [address], [place], [place] [address]    Report Status 08/26/2022 FINAL  Final  Expectorated Sputum Assessment w Gram Stain, Rflx to Resp Cult     Status: None   Collection Time: 08/22/22 11:22 AM   Specimen: Sputum  Result Value Ref Range Status   Specimen Description SPUTUM  Final   Special Requests EXPSU  Final   Sputum evaluation   Final    THIS SPECIMEN IS ACCEPTABLE FOR SPUTUM CULTURE Performed at [facility], [address], [place], [place] [address]    Report Status 08/22/2022 FINAL  Final  Culture, Respiratory w Gram Stain     Status: None   Collection Time: 08/22/22 11:22 AM   Specimen: SPU  Result Value Ref Range Status   Specimen Description   Final    SPUTUM Performed at [facility], [address], [place], [place] [address]    Special Requests   Final    EXPSU Reflexed from [id number] Performed at [facility], [address] [place], [place] [address]    Gram Stain   Final    ABUNDANT SQUAMOUS EPITHELIAL CELLS PRESENT FEW WBC PRESENT, PREDOMINANTLY PMN ABUNDANT GRAM VARIABLE ROD ABUNDANT GRAM POSITIVE COCCI IN PAIRS    Culture   Final    Normal respiratory flora-no Staph aureus or Pseudomonas seen Performed at [facility], [address] [place], [place] [address]    Report  Status 08/25/2022 FINAL  Final  SARS Coronavirus 2 by RT PCR (hospital order, performed in [facility] hospital lab) *cepheid single result test* Anterior Nasal Swab     Status: None   Collection Time:  09/19/22  1:31 PM   Specimen: Anterior Nasal Swab  Result Value Ref Range Status   SARS Coronavirus 2 by RT PCR NEGATIVE NEGATIVE Final    Comment: (NOTE) SARS-CoV-2 target nucleic acids are NOT DETECTED.  The SARS-CoV-2 RNA is generally detectable in upper and lower respiratory specimens during the acute phase of infection. The lowest concentration of SARS-CoV-2 viral copies this assay can detect is 250 copies / mL. A negative result does not preclude SARS-CoV-2 infection and should not be used as the sole basis for treatment or other patient management decisions.  A negative result may occur with improper specimen collection / handling, submission of specimen other than nasopharyngeal swab, presence of viral mutation(s) within the areas targeted by this assay, and inadequate number of viral copies (<250 copies / mL). A negative result must be combined with clinical observations, patient history, and epidemiological information.  Fact Sheet for Patients:   RoadLapTop.co.za  Fact Sheet for Healthcare Providers: http://kim-miller.com/  This test is not yet approved or  cleared by the [place] FDA and has been authorized for detection and/or diagnosis of SARS-CoV-2 by FDA under an Emergency Use Authorization (EUA).  This EUA will remain in effect (meaning this test can be used) for the duration of the COVID-19 declaration under Section 564(b)(1) of the Act, 21 U.S.C. section 360bbb-3(b)(1), unless the authorization is terminated or revoked sooner.  Performed at [facility], [address] [place], [place] [address]   Respiratory (~20 pathogens) panel by PCR     Status: None   Collection Time: 09/19/22  1:31 PM    Specimen: Nasopharyngeal Swab; Respiratory  Result Value Ref Range Status   Adenovirus NOT DETECTED NOT DETECTED Final   Coronavirus 229E NOT DETECTED NOT DETECTED Final    Comment: (NOTE) The Coronavirus on the Respiratory Panel, DOES NOT test for the novel  Coronavirus (2019 nCoV)    Coronavirus HKU1 NOT DETECTED NOT DETECTED Final   Coronavirus NL63 NOT DETECTED NOT DETECTED Final   Coronavirus OC43 NOT DETECTED NOT DETECTED Final   Metapneumovirus NOT DETECTED NOT DETECTED Final   Rhinovirus / Enterovirus NOT DETECTED NOT DETECTED Final   Influenza A NOT DETECTED NOT DETECTED Final   Influenza B NOT DETECTED NOT DETECTED Final   Parainfluenza Virus 1 NOT DETECTED NOT DETECTED Final   Parainfluenza Virus 2 NOT DETECTED NOT DETECTED Final   Parainfluenza Virus 3 NOT DETECTED NOT DETECTED Final   Parainfluenza Virus 4 NOT DETECTED NOT DETECTED Final   Respiratory Syncytial Virus NOT DETECTED NOT DETECTED Final   Bordetella pertussis NOT DETECTED NOT DETECTED Final   Bordetella Parapertussis NOT DETECTED NOT DETECTED Final   Chlamydophila pneumoniae NOT DETECTED NOT DETECTED Final   Mycoplasma pneumoniae NOT DETECTED NOT DETECTED Final    Comment: Performed at [facility], [address], [place], [place] [address]    Coagulation Studies: No results for input(s): "LABPROT", "INR" in the last 72 hours.  Urinalysis: No results for input(s): "COLORURINE", "LABSPEC", "PHURINE", "GLUCOSEU", "HGBUR", "BILIRUBINUR", "KETONESUR", "PROTEINUR", "UROBILINOGEN", "NITRITE", "LEUKOCYTESUR" in the last 72 hours.  Invalid input(s): "APPERANCEUR"     Imaging: No results found.   Medications:    sodium chloride 10 mL/hr at 09/22/22 0450   sodium chloride     doxycycline (VIBRAMYCIN) IV 100 mg (09/25/22 1007)    acidophilus  1 capsule Oral Daily   apixaban  2.5 mg Oral BID   cyanocobalamin  1,000 mcg Oral Daily   diltiazem  360 mg  Oral Daily   feeding supplement  237 mL Oral  BID BM   liver oil-zinc oxide   Topical TID   metoprolol succinate  100 mg Oral Daily   multivitamins with iron  1 tablet Oral Daily   pantoprazole  40 mg Oral Daily   polyethylene glycol  17 g Oral Daily   predniSONE  50 mg Oral Q breakfast   Followed by   Melene Muller ON 10/01/2022] predniSONE  45 mg Oral Q breakfast   Followed by   Melene Muller ON 10/08/2022] predniSONE  40 mg Oral Q breakfast   Followed by   Melene Muller ON 10/15/2022] predniSONE  35 mg Oral Q breakfast   Followed by   Melene Muller ON 10/22/2022] predniSONE  30 mg Oral Q breakfast   Followed by   Melene Muller ON 10/29/2022] predniSONE  25 mg Oral Q breakfast   Followed by   Melene Muller ON 11/05/2022] predniSONE  20 mg Oral Q breakfast   Followed by   Melene Muller ON 11/12/2022] predniSONE  15 mg Oral Q breakfast   Followed by   Melene Muller ON 11/19/2022] predniSONE  10 mg Oral Q breakfast   Followed by   Melene Muller ON 11/26/2022] predniSONE  5 mg Oral Q breakfast   rosuvastatin  10 mg Oral Daily   senna-docusate  1 tablet Oral BID   sodium chloride flush  3 mL Intravenous Q12H   sodium chloride, sodium chloride, acetaminophen, albuterol, bisacodyl, dextromethorphan-guaiFENesin, melatonin, ondansetron (ZOFRAN) IV, simethicone, sodium chloride, sodium chloride flush, traMADol  Assessment/ Plan:  Mr. MALVIN MORRISH is a 87 y.o.  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  Acute kidney injury with proteinuria.  Baseline creatinine of 1.05 GFR > 60 on 07/02/22.  AKI likely Secondary to ATN versus AIN from  cefazolin. IV contrast exposure on 07/24/22. - Creatinine continues to show improvements with adequate urine output. -Continue supportive care. Avoid Nephrotoxins.  -Will schedule follow-up in our office at discharge.  2. Acute respiratory failure required HFNC during hospitalization.    - Diuretic held per cardiology recommendation   -Weaned to room air   3.  Acute on chronic diastolic heart failure.  With LE edema   Ejection fraction 55 to 60% with grade 1 diastolic dysfunction.  4.  Anemia with renal failure:     Lab Results  Component Value Date   HGB 9.6 (L) 09/25/2022  Hgb acceptable for this patient.    LOS: 38   7/24/20241:54 PM

## 2022-09-25 NOTE — Plan of Care (Signed)
PMT has been shadowing for decline.  Plans in place for patient to discharge to rehab facility today.  PMT will sign off at this time.  Please reconsult if needs arise.

## 2022-09-25 NOTE — TOC Transition Note (Signed)
Transition of Care [facility]) - CM/SW Discharge Note   Patient Details  Name: Brett Riley MRN: [id number] Date of Birth: [birth year]  [facility] of [facility] CM/SW Contact:  Darolyn Rua, LCSW Phone Number: 09/25/2022, 9:53 AM   Clinical Narrative:     Patient will DC to: [facility] Anticipated DC date: 09/25/22 Family notified: spouse Social worker by: Wendie Simmer  Per MD patient ready for DC to [facility] . RN, patient, patient's family, and facility notified of DC. Discharge Summary sent to facility. RN given number for report  [phone] Room 404b. DC packet on chart. Ambulance transport requested for patient.  CSW signing off.  Angeline Slim, LCSW     Final next level of care: Skilled Nursing Facility Barriers to Discharge: No Barriers Identified   Patient Goals and CMS Choice      Discharge Placement                         Discharge Plan and Services Additional resources added to the After Visit Summary for       Post Acute Care Choice: Skilled Nursing Facility                               Social Determinants of Health (SDOH) Interventions SDOH Screenings   Food Insecurity: No Food Insecurity (08/18/2022)  Housing: Low Risk  (08/18/2022)  Transportation Needs: No Transportation Needs (08/18/2022)  Utilities: Not At Risk (08/18/2022)  Depression (PHQ2-9): Low Risk  (06/06/2022)  Financial Resource Strain: Low Risk  (08/29/2020)  Physical Activity: Insufficiently Active (08/29/2020)  Social Connections: Unknown (08/29/2020)  Stress: No Stress Concern Present (08/29/2020)  Tobacco Use: Low Risk  (09/19/2022)     Readmission Risk Interventions     No data to display

## 2022-09-25 NOTE — Care Management Important Message (Signed)
Important Message  Patient Details  Name: Brett Riley MRN: [id number] Date of Birth: 1935/04/20   Medicare Important Message Given:  Yes     [name] 09/25/2022, 10:59 AM

## 2022-09-25 NOTE — Progress Notes (Signed)
Tried to call and give report.  Left message for nursing supervisor at [facility] to call me back for report.  A copy of the AVS given to wife and EMS to take to facility.

## 2022-09-25 NOTE — Discharge Summary (Signed)
Physician Discharge Summary   Patient: Brett Riley MRN: [id number] DOB: [birth year]  Admit date:     08/18/2022  Discharge date: 09/25/22  Discharge Physician: Marcelino Duster   PCP: Dale [place], MD   Recommendations at discharge:    PCP follow up in 1 week Cardiology, Nephrology follow up as suggested.  Discharge Diagnoses: Principal Problem:   Acute respiratory failure with hypoxia (HCC) Active Problems:   Acute on chronic heart failure with preserved ejection fraction (HFpEF) (HCC)   MSSA bacteremia   Vertebral osteomyelitis (HCC)   CAD (coronary artery disease)   Myocardial injury   Hypertension   Hyperlipidemia   Chronic kidney disease, stage 3a (HCC)   Normocytic anemia   Atrial fibrillation, chronic (HCC)   PVD (peripheral vascular disease) (HCC)   Paroxysmal atrial fibrillation (HCC)   HCAP (healthcare-associated pneumonia)   Sepsis (HCC)   Pleural effusion   Amiodarone pulmonary toxicity   Acute on chronic HFrEF (heart failure with reduced ejection fraction) (HCC)   Pleural effusion on right   Status post thoracentesis   Heart failure with preserved ejection fraction (HCC)   Stage 3 chronic kidney disease (HCC)  Resolved Problems:   * No resolved hospital problems. *  Hospital Course: Brett Riley is a 87 y.o. male with medical history significant of dCHF, HTN, HLD, CAD, CKD-3a, anemia, A fib on Eliquis, who presents 08/18/2022 to ED from rehab, chief complaint SOB. Of note, recent admission 07/20/22-08/09/22, long stay w/ back pain, aortic ulcer w/ repair 05/22, stay complicated by sepsis w/ MSSA bacteremia and L-spine osteomyelitis/abscess no surgery, discharged on IV Cefepime therapy.  Patient is admitted to the hospitalist service on 08/17/2025 for further management evaluation of chronic respiratory failure with hypoxia, acute on chronic diastolic CHF.  Patient is placed on BiPAP in the emergency department, started on heparin drip for NSTEMI.   Cardiologist evaluated patient increased diuresis.  Patient's oxygen demand did not improve, required high flow nasal cannula.  Patient had prolonged hospital stay, on and off worsening respiratory status.  Patient is seen by pulmonology, ID and nephrology teams.  Given CT chest findings of atypical infection, edema and worsening shortness of breath amiodarone was held, had long discussion with patient and family, agreed for DNR. Nephrology followed him for acute kidney injury, advised to avoid nephrotoxins.  ID recommended to stop cefepime and start oral doxycycline therapy until 10/24/2022.  Pulmonology recommended prolonged steroid taper which is ordered.  Patient's kidney function gradually improving, nephrology did not recommend need for dialysis.  Patient is continued on diltiazem, metoprolol for his A-fib.  He had right heart cath which showed normal filling pressures.  Patient's oxygen gradually weaned down.  Patient is very weak, not getting out of bed.  Physical therapy recommended skilled nursing facility placement.  Today, patient had a bed available and is hemodynamically stable to be discharged.  Patient will need to follow-up with PCP, cardiology and nephrology as recommended.  Patient and his wife understand agree with the discharge plan.  ASSESSMENT & PLAN:   Principal Problem:   Acute respiratory failure with hypoxia (HCC) Active Problems:   Acute on chronic diastolic CHF (congestive heart failure) (HCC)   MSSA bacteremia   Vertebral osteomyelitis (HCC)   CAD (coronary artery disease)   Myocardial injury   Hypertension   Hyperlipidemia   Chronic kidney disease, stage 3a (HCC)   Normocytic anemia   Atrial fibrillation, chronic (HCC)  Acute on chronic respiratory failure with hypoxia  Weaned off oxygen.  2L supplemental O2 as needed to maintain saturation above 88%. Continue prolonged prednisone taper. Bactrim ss 3x weekly for pjp ppx while on steroids. Right heart cath - normal  filling pressures.   Normocytic anemia Thrombocytopenia Hgb stable around 8 and platelets now stable around 150 Monitor for now   AKI on Chronic kidney disease, stage 3a [facility]): Nephrology advised AKI likely Secondary to ATN versus AIN from cefazolin. IV contrast exposure on 07/24/22. Creatinine improving Outpatient nephrology f/u   diastolic CHF-  Diuresis held, RHC normal.   Pleural effusion IR unable to perform thoracentesis 6/20, 6/21. Effusion now resolved   Atrial fibrillation, chronic (HCC) Heart rate controlled. Continue diltiazem and metoprolol Brett cardiology.  On Eliquis.  Monitor for bleeding. Brett cardiology f/u with them in a few weeks   Recent hx of MSSA bacteremia vertebral osteomyelitis (HCC): Received Ancef until 7/11. Doxycycline 100 mg p.o. twice daily x 6 weeks started Brett ID recommendations (I.e. through 8/22).  PICC line removed 7/12.    Recent penetrating atherosclerotic ulcer Repaired by vascular surgery 07/2022   CAD  Myocardial injury  HLD:  Continue Crestor, beta-blocker, Eliquis No plans for inpatient ischemic evaluation.   GERD Continue PPI   Debility SNF disposition.       Consultants:  Cardiology, ID, Pulmonology, Nephrology, Palliative care. Procedures performed: Right heart Cath.  Disposition: Nursing home Diet recommendation:  Discharge Diet Orders (From admission, onward)     Start     Ordered   09/25/22 0000  Diet - low sodium heart healthy        09/25/22 1102           Cardiac diet DISCHARGE MEDICATION: Allergies as of 09/25/2022   No Known Allergies      Medication List     STOP taking these medications    amiodarone 200 MG tablet Commonly known as: PACERONE   aspirin 81 MG tablet   ceFAZolin IVPB Commonly known as: ANCEF   diltiazem 420 MG 24 hr capsule Commonly known as: TIAZAC   lisinopril 40 MG tablet Commonly known as: ZESTRIL       TAKE these medications    acidophilus Caps capsule Take  1 capsule by mouth daily. Start taking on: September 26, 2022   apixaban 2.5 MG Tabs tablet Commonly known as: ELIQUIS Take 1 tablet (2.5 mg total) by mouth 2 (two) times daily.   clopidogrel 75 MG tablet Commonly known as: PLAVIX TAKE 1 TABLET BY MOUTH EVERY DAY WITH BREAKFAST   cyanocobalamin 1000 MCG tablet Commonly known as: VITAMIN B12 Take 1,000 mcg by mouth daily.   diltiazem 360 MG 24 hr capsule Commonly known as: CARDIZEM CD Take 1 capsule (360 mg total) by mouth daily.   doxycycline 50 MG capsule Commonly known as: VIBRAMYCIN Take 2 capsules (100 mg total) by mouth 2 (two) times daily.   feeding supplement Liqd Take 237 mLs by mouth 2 (two) times daily between meals.   hydrALAZINE 25 MG tablet Commonly known as: APRESOLINE Take 25 mg by mouth every 6 (six) hours as needed (SBP >160).   melatonin 5 MG Tabs Take 0.5 tablets (2.5 mg total) by mouth at bedtime as needed.   metoprolol succinate 100 MG 24 hr tablet Commonly known as: TOPROL-XL TAKE 1 TABLET BY MOUTH EVERY DAY WITH A MEAL   multivitamins with iron Tabs tablet Take 1 tablet by mouth daily.   naloxone 4 MG/0.1ML Liqd nasal spray kit Commonly known as: NARCAN Place 4 mg into the  nose 3 (three) times daily as needed (opioid overdose).   pantoprazole 40 MG tablet Commonly known as: PROTONIX Take 1 tablet (40 mg total) by mouth daily.   polyethylene glycol 17 g packet Commonly known as: MIRALAX / GLYCOLAX Take 17 g by mouth daily.   predniSONE 5 MG tablet Commonly known as: DELTASONE Take 10 tablets (50 mg total) by mouth daily with breakfast for 5 days, THEN 9 tablets (45 mg total) daily with breakfast for 7 days, THEN 8 tablets (40 mg total) daily with breakfast for 7 days, THEN 7 tablets (35 mg total) daily with breakfast for 7 days, THEN 6 tablets (30 mg total) daily with breakfast for 7 days, THEN 5 tablets (25 mg total) daily with breakfast for 7 days, THEN 4 tablets (20 mg total) daily with  breakfast for 7 days, THEN 3 tablets (15 mg total) daily with breakfast for 7 days, THEN 2 tablets (10 mg total) daily with breakfast for 7 days, THEN 1 tablet (5 mg total) daily with breakfast for 7 days. Start taking on: September 25, 2022   rosuvastatin 10 MG tablet Commonly known as: CRESTOR Take 1 tablet (10 mg total) by mouth daily.   senna 8.6 MG Tabs tablet Commonly known as: SENOKOT Take 1 tablet by mouth daily.   sodium chloride 0.65 % Soln nasal spray Commonly known as: OCEAN Place 1 spray into both nostrils as needed for congestion.   sulfamethoxazole-trimethoprim 800-160 MG tablet Commonly known as: BACTRIM DS Take 1 tablet by mouth 3 (three) times a week.   traMADol 50 MG tablet Commonly known as: ULTRAM Take 50 mg by mouth every 8 (eight) hours as needed for moderate pain.   traMADol HCl 100 MG Tabs Take 100 mg by mouth every 12 (twelve) hours as needed (severe pain).        Discharge Exam: Filed Weights   09/23/22 0711 09/24/22 0428 09/25/22 0400  Weight: 79.7 kg 77.1 kg 77 kg   General - Elderly Caucasian ill male, no apparent distress HEENT - PERRLA, EOMI, atraumatic head, non tender sinuses. Lung - Clear, diffuse rales, rhonchi. Heart - S1, S2 heard, no murmurs, rubs, trace pedal edema Neuro - Alert, awake and oriented x 3, non focal exam. Skin - Warm and dry.  Condition at discharge: stable  The results of significant diagnostics from this hospitalization (including imaging, microbiology, ancillary and laboratory) are listed below for reference.   Imaging Studies: DG Chest Port 1 View  Result Date: 09/23/2022 CLINICAL DATA:  Pulmonary infiltrates on chest x-ray. EXAM: PORTABLE CHEST 1 VIEW COMPARISON:  Chest radiograph 09/17/2022 and CT 09/18/2022 FINDINGS: The cardiac silhouette remains enlarged. Aortic atherosclerosis is noted. Widespread, predominantly interstitial type densities in both lungs have mildly improved from the prior radiograph. There may  be a trace right pleural effusion. No pneumothorax is identified. IMPRESSION: Mildly improved bilateral lung infiltrates. Electronically Signed   By: Sebastian Ache M.D.   On: 09/23/2022 11:01   ECHOCARDIOGRAM LIMITED  Result Date: 09/20/2022    ECHOCARDIOGRAM LIMITED REPORT   Patient Name:   KYM FENTER Date of Exam: 09/18/2022 Medical Rec #:  [id number]      Height:       72.0 in Accession #:    [id number]     Weight:       194.7 lb Date of Birth:  12-09-1935     BSA:          2.106 m Patient Age:    5 years  BP:           122/70 mmHg Patient Gender: M              HR:           81 bpm. Exam Location:  [facility] Procedure: 2D Echo, Limited Echo, Limited Color Doppler and Cardiac Doppler Indications:     R06.00 Dyspnea  History:         Patient has prior history of Echocardiogram examinations, most                  recent 08/19/2022. CAD; Risk Factors:Hypertension and                  Dyslipidemia.  Sonographer:     Daphine Deutscher RDCS Referring Phys:  [id number] Raymon Mutton DUNN Diagnosing Phys: Lorine Bears MD IMPRESSIONS  1. Left ventricular ejection fraction, by estimation, is 50 to 55%. The left ventricle has low normal function. The left ventricle has no regional wall motion abnormalities. Left ventricular diastolic parameters were normal.  2. Right ventricular systolic function is normal. The right ventricular size is normal. There is normal pulmonary artery systolic pressure.  3. Left atrial size was mildly dilated.  4. The mitral valve is normal in structure. Mild to moderate mitral valve regurgitation. No evidence of mitral stenosis.  5. Tricuspid valve regurgitation is mild to moderate.  6. The aortic valve is normal in structure. Aortic valve regurgitation is not visualized. Aortic valve sclerosis/calcification is present, without any evidence of aortic stenosis. FINDINGS  Left Ventricle: Left ventricular ejection fraction, by estimation, is 50 to 55%. The left ventricle has low normal function.  The left ventricle has no regional wall motion abnormalities. The left ventricular internal cavity size was normal in size. There is borderline left ventricular hypertrophy. Left ventricular diastolic parameters were normal. Right Ventricle: The right ventricular size is normal. No increase in right ventricular wall thickness. Right ventricular systolic function is normal. There is normal pulmonary artery systolic pressure. The tricuspid regurgitant velocity is 2.25 m/s, and  with an assumed right atrial pressure of 5 mmHg, the estimated right ventricular systolic pressure is 25.2 mmHg. Left Atrium: Left atrial size was mildly dilated. Right Atrium: Right atrial size was normal in size. Pericardium: There is no evidence of pericardial effusion. Mitral Valve: The mitral valve is normal in structure. There is mild thickening of the mitral valve leaflet(s). Mild to moderate mitral valve regurgitation. No evidence of mitral valve stenosis. Tricuspid Valve: The tricuspid valve is normal in structure. Tricuspid valve regurgitation is mild to moderate. No evidence of tricuspid stenosis. Aortic Valve: The aortic valve is normal in structure. Aortic valve regurgitation is not visualized. Aortic valve sclerosis/calcification is present, without any evidence of aortic stenosis. Pulmonic Valve: The pulmonic valve was normal in structure. Pulmonic valve regurgitation is not visualized. No evidence of pulmonic stenosis. Aorta: The aortic root is normal in size and structure. Venous: The inferior vena cava was not well visualized. IAS/Shunts: No atrial level shunt detected by color flow Doppler. LEFT VENTRICLE PLAX 2D LVIDd:         5.10 cm Diastology LVIDs:         3.60 cm LV e' medial:    8.22 cm/s LV PW:         1.00 cm LV E/e' medial:  10.3 LV IVS:        1.00 cm LV e' lateral:   6.91 cm/s  LV E/e' lateral: 12.2  RIGHT VENTRICLE RV S prime:     13.20 cm/s TAPSE (M-mode): 2.2 cm LEFT ATRIUM         Index  LA diam:    4.40 cm 2.09 cm/m   AORTA Ao Root diam: 3.70 cm MITRAL VALVE               TRICUSPID VALVE MV Area (PHT): 2.48 cm    TR Peak grad:   20.2 mmHg MV Decel Time: 306 msec    TR Vmax:        225.00 cm/s MV E velocity: 84.40 cm/s MV A velocity: 99.10 cm/s MV E/A ratio:  0.85 Lorine Bears MD Electronically signed by Lorine Bears MD Signature Date/Time: 09/20/2022/1:11:47 PM    Final    CARDIAC CATHETERIZATION  Result Date: 09/19/2022 Conclusions: Normal left heart, right heart, and pulmonary artery pressures. Normal cardiac output/index. Recommendations: Findings suggest that worsening hypoxia and lung opacities are not due to cardiogenic edema.  Continue to hold diuretics. Further workup of non-cardiac causes of respiratory decompensation [facility] internal medicine and pulmonology. Yvonne Kendall, MD [facility]  CT CHEST WO CONTRAST  Result Date: 09/18/2022 CLINICAL DATA:  Pneumonia respiratory illness EXAM: CT CHEST WITHOUT CONTRAST TECHNIQUE: Multidetector CT imaging of the chest was performed following the standard protocol without IV contrast. RADIATION DOSE REDUCTION: This exam was performed according to the departmental dose-optimization program which includes automated exposure control, adjustment of the mA and/or kV according to patient size and/or use of iterative reconstruction technique. COMPARISON:  Chest x-ray 09/17/2022, chest CT 09/05/2022, 08/21/2022 FINDINGS: Cardiovascular: Limited evaluation without intravenous contrast. Moderate aortic atherosclerosis. Redemonstrated contour abnormality of the ascending thoracic aorta just proximal to the arch, this measures 2.9 x 1.4 cm on series 2, image 59, previously 3 x 1.3 cm. Extensive coronary vascular calcification. Cardiomegaly. No pericardial effusion Mediastinum/Nodes: Midline trachea. No thyroid mass. Borderline mediastinal lymph nodes appear slightly increased in size, for example right paratracheal node measures 9 mm on series 2,  image 46, previously 7 mm. Esophagus within normal limits Lungs/Pleura: Small bilateral pleural effusions. Bilateral bronchiectasis. Multifocal bilateral heterogeneous ground-glass densities throughout the lungs, worse when compared to CT from 09/05/2022, improved since CT 08/21/2022. Upper Abdomen: No acute finding Musculoskeletal: No acute osseous abnormality. IMPRESSION: 1. Multifocal bilateral heterogeneous ground-glass densities throughout the lungs, worse when compared to CT from 09/05/2022, improved since CT 08/21/2022. Findings could be secondary to multifocal pneumonia, though there may be a component of associated pulmonary edema. Slight interval increase in mediastinal lymph nodes. 2. Cardiomegaly with small bilateral pleural effusions. 3. Redemonstrated contour abnormality of the ascending thoracic aorta either representing penetrating ulcer or aneurysm. Correlation with CT angiography when clinically feasible is suggested. 4. Aortic atherosclerosis. Aortic Atherosclerosis (ICD10-I70.0). Electronically Signed   By: Jasmine Pang M.D.   On: 09/18/2022 16:46   DG Chest Port 1 View  Result Date: 09/17/2022 CLINICAL DATA:  Respiratory distress and hypoxia EXAM: PORTABLE CHEST 1 VIEW COMPARISON:  Chest radiograph 1 day prior FINDINGS: The cardiomediastinal silhouette is stable Diffusely increased interstitial markings throughout both lungs again seen with slight interval improvement in the right lung. There is no significant pleural effusion. There is no pneumothorax. There is no acute osseous abnormality. IMPRESSION: Diffusely increased interstitial markings throughout both lungs with slight interval improvement in the right lung since the study from 1 day prior. Electronically Signed   By: Lesia Hausen M.D.   On: 09/17/2022 12:10   DG Abd 1 View  Result Date: 09/16/2022 CLINICAL DATA:  Abdominal pain EXAM: ABDOMEN - 1 VIEW COMPARISON:  None FINDINGS: There is gaseous distention of small-bowel loops  and colon. There is previous intravascular stent repair in abdominal aorta. Increased interstitial markings in the lower lung fields suggest possible scarring. Arterial calcifications are seen. Degenerative changes are noted in lumbar spine. IMPRESSION: There is mild gaseous dilation of small bowel loops. There is moderate to marked gaseous distention in colon. Findings suggest possible ileus. Arteriosclerosis. Previous endovascular stent repair in aorta. Possible scarring in the lower lung fields. Lumbar spondylosis. Electronically Signed   By: Ernie Avena M.D.   On: 09/16/2022 13:24   DG Chest Port 1 View  Result Date: 09/16/2022 CLINICAL DATA:  Hypoxia EXAM: PORTABLE CHEST 1 VIEW COMPARISON:  09/13/2022 FINDINGS: Cardiomegaly. Worsened interstitial and alveolar density diffusely. Findings could be due to progressive pneumonia or progressive edema. IMPRESSION: Worsened interstitial and alveolar density diffusely. Findings could be due to progressive pneumonia or progressive edema. Electronically Signed   By: Paulina Fusi M.D.   On: 09/16/2022 11:18   DG Chest Port 1 View  Result Date: 09/13/2022 CLINICAL DATA:  Hypoxia EXAM: PORTABLE CHEST 1 VIEW COMPARISON:  09/04/2022 FINDINGS: Mild cardiac enlargement. Bilateral perihilar infiltrates likely edema but could indicate multifocal pneumonia or ARDS. No pleural effusions. No pneumothorax. Bronchial wall thickening and bronchiectasis consistent with chronic bronchitis. Calcification of the aorta. Degenerative changes in the spine and shoulders. Right PICC line has been removed in the interval. Otherwise similar appearance to prior study. IMPRESSION: Cardiac enlargement with perihilar infiltrates, likely edema. Electronically Signed   By: Burman Nieves M.D.   On: 09/13/2022 22:01   US RENAL ARTERY DUPLEX LIMITED  Result Date: 09/07/2022 CLINICAL DATA:  Post aortic stent placement, now with acute kidney injury. EXAM: RENAL DUPLEX ULTRASOUND  TECHNIQUE: Duplex and color Doppler ultrasound was utilized to evaluate blood flow in the renal arteries and kidneys. COMPARISON:  Renal ultrasound-08/23/2022 CTA abdomen and pelvis-07/20/2022 FINDINGS: Examination is degraded due to overlying bowel gas and poor sonographic window. Right kidney: Normal cortical thickness, echogenicity and size, measuring 11.6 cm in length. No focal renal lesions. No echogenic renal stones. No urinary obstruction. Left kidney: Normal cortical thickness, echogenicity and size, measuring 12.4 cm in length. No focal renal lesions. No echogenic renal stones. No urinary obstruction. Urinary bladder: Appears normal given degree of distention. Right Renal Artery Velocities: Origin:  53 cm/sec Mid:  64 cm/sec Hilum:  42 cm/sec Interlobar:  Not visualized Arcuate: Not visualized Left Renal Artery Velocities: Origin:  59 cm/sec Mid:  57 cm/sec Hilum:  42 cm/sec Interlobar:  Not visualized Arcuate:  Not visualized Aortic Velocity: 46 cm/sec Right Renal-Aortic Ratios: Origin: 1.1 Mid:  1.4 Hilum: 0.9 Interlobar: N/A Arcuate: N/A Left Renal-Aortic Ratios: Origin: 1.3 Mid: 1.2 Hilum: 0.9 Interlobar: N/A Arcuate: N/A Normal velocities and low resistance waveforms are demonstrated throughout the interrogated portions of the bilateral renal arteries though there is nonvisualization of the interlobar and arcuate arteries bilaterally. No evidence of abdominal aortic aneurysm. IMPRESSION: 1. No sonographic evidence of renal artery stenosis though there is non visualization of the bilateral interlobar and arcuate arteries bilaterally due to bowel gas and poor sonographic window. Note, personal review of CTA of the abdomen pelvis performed 07/20/2022 suggests at least 50% luminal narrowing of the dominant bilateral renal arteries (note is made of 3 separate left-sided renal arteries), though this finding is without associated delayed renal enhancement or asymmetric renal atrophy. 2. No evidence of urinary  obstruction. Electronically Signed   By: Simonne Come M.D.   On: 09/07/2022 11:39   CT CHEST WO CONTRAST  Result Date: 09/05/2022 CLINICAL DATA:  Pneumonia EXAM: CT CHEST WITHOUT CONTRAST TECHNIQUE: Multidetector CT imaging of the chest was performed following the standard protocol without IV contrast. RADIATION DOSE REDUCTION: This exam was performed according to the departmental dose-optimization program which includes automated exposure control, adjustment of the mA and/or kV according to patient size and/or use of iterative reconstruction technique. COMPARISON:  Chest CT dated August 21, 2022; lumbar spine MRI dated Aug 02, 2022 FINDINGS: Cardiovascular: Normal heart size. No pericardial effusion. Moderate calcified plaque of the ascending thoracic aorta. Redemonstrated contour abnormality of the distal thoracic aorta, unchanged when compared with the prior exam severe coronary artery calcifications. Mediastinum/Nodes: Esophagus and thyroid are unremarkable. Decreased size of mediastinal lymph nodes. Reference AP window lymph node measuring 7 mm in short axis on series 2, image 56, previously 9 mm. Lungs/Pleura: Central airways are patent. Interval improvement of bilateral lung opacities with mild residual ground-glass and scattered consolidations. Small bilateral pleural effusions, decreased in size when compared with the prior exam. Upper Abdomen: Partially visualized aortic stent graft. Posterior diverticulum of the stomach. No acute abnormality. Musculoskeletal: Focal endplate irregularity of L1-L2 which correlates with discitis osteomyelitis described on prior lumbar spine MRI. No chest wall mass or suspicious bone lesions identified. IMPRESSION: 1. Interval improvement of bilateral lung opacities with mild residual ground-glass and scattered consolidations, likely improving multifocal pneumonia. 2. Small bilateral pleural effusions, decreased in size when compared with the prior exam. 3. Decreased size of  mediastinal lymph nodes, consistent with resolving reactive adenopathy. 4. Coronary artery calcifications and aortic Atherosclerosis (ICD10-I70.0). 5. Unchanged contour abnormality of the distal ascending thoracic aorta, possibly a chronic penetrating atherosclerotic ulcer or aneurysm. Chest CTA could be performed for further characterization. Electronically Signed   By: Allegra Lai M.D.   On: 09/05/2022 14:23   DG Chest Port 1 View  Result Date: 09/04/2022 CLINICAL DATA:  Congestive heart failure EXAM: PORTABLE CHEST 1 VIEW COMPARISON:  X-ray 08/25/2022 FINDINGS: Stable right-sided PICC. There persistent patchy bilateral areas of opacity more in the left lung than right. These slightly increasing in the left lung. Increasing area right midlung. No pneumothorax or effusion. Stable enlarged cardiopericardial silhouette with a calcified aorta. Persistent dilated air-filled loops of bowel beneath the left hemidiaphragm. IMPRESSION: Slight increase in patchy left lung opacities left-greater-than-right. Recommend continued follow-up Electronically Signed   By: Karen Kays M.D.   On: 09/04/2022 16:26   DG Knee 1-2 Views Right  Result Date: 08/27/2022 CLINICAL DATA:  Right knee pain. EXAM: RIGHT KNEE - 1-2 VIEW COMPARISON:  Right knee radiographs 07/31/2022 FINDINGS: Minimal medial compartment joint space narrowing. Small joint effusion, minimally decreased from prior. Minimal superior patellar degenerative spurring. No acute fracture or dislocation. Moderate atherosclerotic calcifications. IMPRESSION: 1. Minimal medial compartment osteoarthritis. 2. Small joint effusion. Electronically Signed   By: Neita Garnet M.D.   On: 08/27/2022 15:25    Microbiology: Results for orders placed or performed during the hospital encounter of 08/18/22  Blood culture (routine x 2)     Status: None   Collection Time: 08/18/22 10:45 AM   Specimen: BLOOD LEFT ARM  Result Value Ref Range Status   Specimen Description  BLOOD LEFT ARM  Final   Special Requests   Final    BOTTLES DRAWN AEROBIC AND ANAEROBIC Blood Culture adequate volume   Culture   Final    NO  GROWTH 5 DAYS Performed at [facility], [address] [place], [place] [address]    Report Status 08/23/2022 FINAL  Final  Resp Panel by RT-PCR (Flu A&B, Covid) Anterior Nasal Swab     Status: None   Collection Time: 08/18/22 10:46 AM   Specimen: Anterior Nasal Swab  Result Value Ref Range Status   SARS Coronavirus 2 by RT PCR NEGATIVE NEGATIVE Final    Comment: (NOTE) SARS-CoV-2 target nucleic acids are NOT DETECTED.  The SARS-CoV-2 RNA is generally detectable in upper respiratory specimens during the acute phase of infection. The lowest concentration of SARS-CoV-2 viral copies this assay can detect is 138 copies/mL. A negative result does not preclude SARS-Cov-2 infection and should not be used as the sole basis for treatment or other patient management decisions. A negative result may occur with  improper specimen collection/handling, submission of specimen other than nasopharyngeal swab, presence of viral mutation(s) within the areas targeted by this assay, and inadequate number of viral copies(<138 copies/mL). A negative result must be combined with clinical observations, patient history, and epidemiological information. The expected result is Negative.  Fact Sheet for Patients:  BloggerCourse.com  Fact Sheet for Healthcare Providers:  SeriousBroker.it  This test is no t yet approved or cleared by the [place] FDA and  has been authorized for detection and/or diagnosis of SARS-CoV-2 by FDA under an Emergency Use Authorization (EUA). This EUA will remain  in effect (meaning this test can be used) for the duration of the COVID-19 declaration under Section 564(b)(1) of the Act, 21 U.S.C.section 360bbb-3(b)(1), unless the authorization is terminated  or revoked sooner.        Influenza A by PCR NEGATIVE NEGATIVE Final   Influenza B by PCR NEGATIVE NEGATIVE Final    Comment: (NOTE) The Xpert Xpress SARS-CoV-2/FLU/RSV plus assay is intended as an aid in the diagnosis of influenza from Nasopharyngeal swab specimens and should not be used as a sole basis for treatment. Nasal washings and aspirates are unacceptable for Xpert Xpress SARS-CoV-2/FLU/RSV testing.  Fact Sheet for Patients: BloggerCourse.com  Fact Sheet for Healthcare Providers: SeriousBroker.it  This test is not yet approved or cleared by the [place] FDA and has been authorized for detection and/or diagnosis of SARS-CoV-2 by FDA under an Emergency Use Authorization (EUA). This EUA will remain in effect (meaning this test can be used) for the duration of the COVID-19 declaration under Section 564(b)(1) of the Act, 21 U.S.C. section 360bbb-3(b)(1), unless the authorization is terminated or revoked.  Performed at [facility], [address] [place], [place] [address]   Culture, blood (Routine X 2) w Reflex to ID Panel     Status: None   Collection Time: 08/18/22  9:19 PM   Specimen: BLOOD LEFT ARM  Result Value Ref Range Status   Specimen Description BLOOD LEFT ARM  Final   Special Requests   Final    BOTTLES DRAWN AEROBIC AND ANAEROBIC Blood Culture adequate volume   Culture   Final    NO GROWTH 5 DAYS Performed at [facility], [address], [place], [place] [address]    Report Status 08/23/2022 FINAL  Final  Respiratory (~20 pathogens) panel by PCR     Status: None   Collection Time: 08/21/22  1:50 PM   Specimen: Nasopharyngeal Swab; Respiratory  Result Value Ref Range Status   Adenovirus NOT DETECTED NOT DETECTED Final   Coronavirus 229E NOT DETECTED NOT DETECTED Final    Comment: (NOTE) The Coronavirus  on the Respiratory Panel, DOES NOT test for the novel  Coronavirus (2019 nCoV)    Coronavirus  HKU1 NOT DETECTED NOT DETECTED Final   Coronavirus NL63 NOT DETECTED NOT DETECTED Final   Coronavirus OC43 NOT DETECTED NOT DETECTED Final   Metapneumovirus NOT DETECTED NOT DETECTED Final   Rhinovirus / Enterovirus NOT DETECTED NOT DETECTED Final   Influenza A NOT DETECTED NOT DETECTED Final   Influenza B NOT DETECTED NOT DETECTED Final   Parainfluenza Virus 1 NOT DETECTED NOT DETECTED Final   Parainfluenza Virus 2 NOT DETECTED NOT DETECTED Final   Parainfluenza Virus 3 NOT DETECTED NOT DETECTED Final   Parainfluenza Virus 4 NOT DETECTED NOT DETECTED Final   Respiratory Syncytial Virus NOT DETECTED NOT DETECTED Final   Bordetella pertussis NOT DETECTED NOT DETECTED Final   Bordetella Parapertussis NOT DETECTED NOT DETECTED Final   Chlamydophila pneumoniae NOT DETECTED NOT DETECTED Final   Mycoplasma pneumoniae NOT DETECTED NOT DETECTED Final    Comment: Performed at [facility], [address], [place], [place] [address]  Culture, blood (Routine X 2) w Reflex to ID Panel     Status: None   Collection Time: 08/21/22  8:58 PM   Specimen: BLOOD  Result Value Ref Range Status   Specimen Description BLOOD LEFT WRIST  Final   Special Requests   Final    BOTTLES DRAWN AEROBIC AND ANAEROBIC Blood Culture adequate volume   Culture   Final    NO GROWTH 5 DAYS Performed at [facility] [address] [place], [place] [address]    Report Status 08/26/2022 FINAL  Final  Culture, blood (Routine X 2) w Reflex to ID Panel     Status: None   Collection Time: 08/21/22 11:01 PM   Specimen: BLOOD  Result Value Ref Range Status   Specimen Description BLOOD BLOOD LEFT ARM  Final   Special Requests   Final    BOTTLES DRAWN AEROBIC AND ANAEROBIC Blood Culture adequate volume   Culture   Final    NO GROWTH 5 DAYS Performed at [facility], [address], [place], [place] [address]    Report Status 08/26/2022 FINAL  Final  Expectorated Sputum Assessment w Gram Stain,  Rflx to Resp Cult     Status: None   Collection Time: 08/22/22 11:22 AM   Specimen: Sputum  Result Value Ref Range Status   Specimen Description SPUTUM  Final   Special Requests EXPSU  Final   Sputum evaluation   Final    THIS SPECIMEN IS ACCEPTABLE FOR SPUTUM CULTURE Performed at [facility], [address], [place], [place] [address]    Report Status 08/22/2022 FINAL  Final  Culture, Respiratory w Gram Stain     Status: None   Collection Time: 08/22/22 11:22 AM   Specimen: SPU  Result Value Ref Range Status   Specimen Description   Final    SPUTUM Performed at [facility], [address], [place], [place] [address]    Special Requests   Final    EXPSU Reflexed from [id number] Performed at [facility], [address] [place], [place] [address]    Gram Stain   Final    ABUNDANT SQUAMOUS EPITHELIAL CELLS PRESENT FEW WBC PRESENT, PREDOMINANTLY PMN ABUNDANT GRAM VARIABLE ROD ABUNDANT GRAM POSITIVE COCCI IN PAIRS    Culture   Final    Normal respiratory flora-no Staph aureus or Pseudomonas seen Performed at [facility], [address], [place], [place] [address]    Report Status 08/25/2022 FINAL  Final  SARS Coronavirus 2 by RT PCR (hospital order, performed in [facility] hospital lab) *cepheid single result test* Anterior Nasal Swab     Status: None   Collection Time: 09/19/22  1:31 PM   Specimen: Anterior Nasal Swab  Result Value Ref Range Status   SARS Coronavirus 2 by RT PCR NEGATIVE NEGATIVE Final    Comment: (NOTE) SARS-CoV-2 target nucleic acids are NOT DETECTED.  The SARS-CoV-2 RNA is generally detectable in upper and lower respiratory specimens during the acute phase of infection. The lowest concentration of SARS-CoV-2 viral copies this assay can detect is 250 copies / mL. A negative result does not preclude SARS-CoV-2 infection and should not be used as the sole basis for treatment or other patient management decisions.  A  negative result may occur with improper specimen collection / handling, submission of specimen other than nasopharyngeal swab, presence of viral mutation(s) within the areas targeted by this assay, and inadequate number of viral copies (<250 copies / mL). A negative result must be combined with clinical observations, patient history, and epidemiological information.  Fact Sheet for Patients:   RoadLapTop.co.za  Fact Sheet for Healthcare Providers: http://kim-miller.com/  This test is not yet approved or  cleared by the [place] FDA and has been authorized for detection and/or diagnosis of SARS-CoV-2 by FDA under an Emergency Use Authorization (EUA).  This EUA will remain in effect (meaning this test can be used) for the duration of the COVID-19 declaration under Section 564(b)(1) of the Act, 21 U.S.C. section 360bbb-3(b)(1), unless the authorization is terminated or revoked sooner.  Performed at [facility], [address] [place], [place] [address]   Respiratory (~20 pathogens) panel by PCR     Status: None   Collection Time: 09/19/22  1:31 PM   Specimen: Nasopharyngeal Swab; Respiratory  Result Value Ref Range Status   Adenovirus NOT DETECTED NOT DETECTED Final   Coronavirus 229E NOT DETECTED NOT DETECTED Final    Comment: (NOTE) The Coronavirus on the Respiratory Panel, DOES NOT test for the novel  Coronavirus (2019 nCoV)    Coronavirus HKU1 NOT DETECTED NOT DETECTED Final   Coronavirus NL63 NOT DETECTED NOT DETECTED Final   Coronavirus OC43 NOT DETECTED NOT DETECTED Final   Metapneumovirus NOT DETECTED NOT DETECTED Final   Rhinovirus / Enterovirus NOT DETECTED NOT DETECTED Final   Influenza A NOT DETECTED NOT DETECTED Final   Influenza B NOT DETECTED NOT DETECTED Final   Parainfluenza Virus 1 NOT DETECTED NOT DETECTED Final   Parainfluenza Virus 2 NOT DETECTED NOT DETECTED Final   Parainfluenza Virus 3 NOT  DETECTED NOT DETECTED Final   Parainfluenza Virus 4 NOT DETECTED NOT DETECTED Final   Respiratory Syncytial Virus NOT DETECTED NOT DETECTED Final   Bordetella pertussis NOT DETECTED NOT DETECTED Final   Bordetella Parapertussis NOT DETECTED NOT DETECTED Final   Chlamydophila pneumoniae NOT DETECTED NOT DETECTED Final   Mycoplasma pneumoniae NOT DETECTED NOT DETECTED Final    Comment: Performed at [facility], [address], [place], [place] [address]    Labs: CBC: Recent Labs  Lab 09/18/22 1436 09/19/22 0630 09/19/22 1659 09/20/22 0914 09/22/22 0629 09/25/22 0604  WBC 6.4 6.4  --  5.5 5.3 7.6  HGB 8.0* 8.3* 8.2* 8.5* 8.1* 9.6*  HCT 25.2* 25.9* 24.0* 27.4* 25.7* 30.4*  MCV 92.6 90.9  --  92.3 90.8 91.0  PLT 152 165  --  174 201 259   Basic Metabolic Panel: Recent Labs  Lab 09/20/22 0914 09/21/22 0521 09/22/22 0629 09/23/22 0446 09/24/22 0444 09/25/22 0604  NA 143 143 141 141 142 142  K 3.1* 4.1 3.9 3.9 3.9 3.3*  CL 99 102 102 101 102 106  CO2 28 28 27 25 27 26   GLUCOSE 124* 122* 128* 126* 125* 116*  BUN 99* 102* 98* 102* 100* 92*  CREATININE 4.25* 4.38* 4.31* 4.12* 3.71* 3.39*  CALCIUM 8.9 8.7* 8.8* 8.7* 8.6* 8.4*  MG 1.9  --   --   --   --   --    Liver Function Tests: Recent Labs  Lab 09/18/22 1436  ALBUMIN 2.5*   CBG: No results for input(s): "GLUCAP" in the last 168 hours.  Discharge time spent: greater than 30 minutes.  Signed: Marcelino Duster, MD [facility] 09/25/2022

## 2022-09-26 DIAGNOSIS — I5033 Acute on chronic diastolic (congestive) heart failure: Secondary | ICD-10-CM | POA: Diagnosis not present

## 2022-09-26 DIAGNOSIS — M4626 Osteomyelitis of vertebra, lumbar region: Secondary | ICD-10-CM | POA: Diagnosis not present

## 2022-09-26 DIAGNOSIS — R531 Weakness: Secondary | ICD-10-CM | POA: Diagnosis not present

## 2022-09-26 DIAGNOSIS — I480 Paroxysmal atrial fibrillation: Secondary | ICD-10-CM | POA: Diagnosis not present

## 2022-09-26 DIAGNOSIS — R7881 Bacteremia: Secondary | ICD-10-CM | POA: Diagnosis not present

## 2022-10-02 DIAGNOSIS — I10 Essential (primary) hypertension: Secondary | ICD-10-CM | POA: Diagnosis not present

## 2022-10-02 DIAGNOSIS — D649 Anemia, unspecified: Secondary | ICD-10-CM | POA: Diagnosis not present

## 2022-10-02 DIAGNOSIS — E785 Hyperlipidemia, unspecified: Secondary | ICD-10-CM | POA: Diagnosis not present

## 2022-10-02 DIAGNOSIS — G9341 Metabolic encephalopathy: Secondary | ICD-10-CM | POA: Diagnosis not present

## 2022-10-03 ENCOUNTER — Other Ambulatory Visit: Payer: Self-pay | Admitting: Internal Medicine

## 2022-10-06 NOTE — Progress Notes (Signed)
-   Demand ischemia

## 2022-10-07 NOTE — Progress Notes (Signed)
Toxic metabolic encephalopathy - meds, hypoxia

## 2022-10-08 ENCOUNTER — Inpatient Hospital Stay: Payer: Medicare HMO | Admitting: Infectious Diseases

## 2022-10-11 ENCOUNTER — Emergency Department (HOSPITAL_COMMUNITY): Payer: Medicare HMO

## 2022-10-11 ENCOUNTER — Inpatient Hospital Stay (HOSPITAL_COMMUNITY)
Admission: EM | Admit: 2022-10-11 | Discharge: 2022-10-15 | DRG: 191 | Disposition: A | Discharge status: Skilled Nursing Facility | Payer: Medicare HMO | Source: Skilled Nursing Facility | Attending: Student | Admitting: Student

## 2022-10-11 ENCOUNTER — Other Ambulatory Visit: Payer: Self-pay

## 2022-10-11 DIAGNOSIS — Z7901 Long term (current) use of anticoagulants: Secondary | ICD-10-CM

## 2022-10-11 DIAGNOSIS — H9190 Unspecified hearing loss, unspecified ear: Secondary | ICD-10-CM | POA: Diagnosis present

## 2022-10-11 DIAGNOSIS — R932 Abnormal findings on diagnostic imaging of liver and biliary tract: Secondary | ICD-10-CM | POA: Diagnosis not present

## 2022-10-11 DIAGNOSIS — I480 Paroxysmal atrial fibrillation: Secondary | ICD-10-CM | POA: Diagnosis present

## 2022-10-11 DIAGNOSIS — Z951 Presence of aortocoronary bypass graft: Secondary | ICD-10-CM

## 2022-10-11 DIAGNOSIS — R0602 Shortness of breath: Secondary | ICD-10-CM | POA: Diagnosis not present

## 2022-10-11 DIAGNOSIS — D696 Thrombocytopenia, unspecified: Secondary | ICD-10-CM | POA: Diagnosis present

## 2022-10-11 DIAGNOSIS — Z7952 Long term (current) use of systemic steroids: Secondary | ICD-10-CM

## 2022-10-11 DIAGNOSIS — I5032 Chronic diastolic (congestive) heart failure: Secondary | ICD-10-CM | POA: Insufficient documentation

## 2022-10-11 DIAGNOSIS — Z792 Long term (current) use of antibiotics: Secondary | ICD-10-CM

## 2022-10-11 DIAGNOSIS — R54 Age-related physical debility: Secondary | ICD-10-CM | POA: Diagnosis present

## 2022-10-11 DIAGNOSIS — K5900 Constipation, unspecified: Secondary | ICD-10-CM | POA: Diagnosis not present

## 2022-10-11 DIAGNOSIS — J9611 Chronic respiratory failure with hypoxia: Secondary | ICD-10-CM | POA: Diagnosis present

## 2022-10-11 DIAGNOSIS — I10 Essential (primary) hypertension: Secondary | ICD-10-CM | POA: Diagnosis not present

## 2022-10-11 DIAGNOSIS — Z79899 Other long term (current) drug therapy: Secondary | ICD-10-CM

## 2022-10-11 DIAGNOSIS — Z7951 Long term (current) use of inhaled steroids: Secondary | ICD-10-CM

## 2022-10-11 DIAGNOSIS — M4626 Osteomyelitis of vertebra, lumbar region: Secondary | ICD-10-CM | POA: Diagnosis present

## 2022-10-11 DIAGNOSIS — I776 Arteritis, unspecified: Secondary | ICD-10-CM | POA: Diagnosis present

## 2022-10-11 DIAGNOSIS — N183 Chronic kidney disease, stage 3 unspecified: Secondary | ICD-10-CM | POA: Insufficient documentation

## 2022-10-11 DIAGNOSIS — K6389 Other specified diseases of intestine: Secondary | ICD-10-CM | POA: Diagnosis not present

## 2022-10-11 DIAGNOSIS — Y95 Nosocomial condition: Secondary | ICD-10-CM | POA: Diagnosis present

## 2022-10-11 DIAGNOSIS — E7800 Pure hypercholesterolemia, unspecified: Secondary | ICD-10-CM | POA: Diagnosis present

## 2022-10-11 DIAGNOSIS — T148XXA Other injury of unspecified body region, initial encounter: Secondary | ICD-10-CM | POA: Diagnosis present

## 2022-10-11 DIAGNOSIS — Z7902 Long term (current) use of antithrombotics/antiplatelets: Secondary | ICD-10-CM

## 2022-10-11 DIAGNOSIS — J189 Pneumonia, unspecified organism: Secondary | ICD-10-CM

## 2022-10-11 DIAGNOSIS — I700 Atherosclerosis of aorta: Secondary | ICD-10-CM | POA: Diagnosis present

## 2022-10-11 DIAGNOSIS — Z803 Family history of malignant neoplasm of breast: Secondary | ICD-10-CM

## 2022-10-11 DIAGNOSIS — I469 Cardiac arrest, cause unspecified: Secondary | ICD-10-CM | POA: Diagnosis not present

## 2022-10-11 DIAGNOSIS — B9561 Methicillin susceptible Staphylococcus aureus infection as the cause of diseases classified elsewhere: Secondary | ICD-10-CM | POA: Diagnosis present

## 2022-10-11 DIAGNOSIS — Z85828 Personal history of other malignant neoplasm of skin: Secondary | ICD-10-CM

## 2022-10-11 DIAGNOSIS — M160 Bilateral primary osteoarthritis of hip: Secondary | ICD-10-CM | POA: Diagnosis present

## 2022-10-11 DIAGNOSIS — Z743 Need for continuous supervision: Secondary | ICD-10-CM | POA: Diagnosis not present

## 2022-10-11 DIAGNOSIS — D649 Anemia, unspecified: Secondary | ICD-10-CM | POA: Diagnosis present

## 2022-10-11 DIAGNOSIS — I2510 Atherosclerotic heart disease of native coronary artery without angina pectoris: Secondary | ICD-10-CM | POA: Diagnosis present

## 2022-10-11 DIAGNOSIS — J470 Bronchiectasis with acute lower respiratory infection: Principal | ICD-10-CM | POA: Diagnosis present

## 2022-10-11 DIAGNOSIS — Z823 Family history of stroke: Secondary | ICD-10-CM

## 2022-10-11 DIAGNOSIS — S301XXA Contusion of abdominal wall, initial encounter: Secondary | ICD-10-CM | POA: Diagnosis present

## 2022-10-11 DIAGNOSIS — Z8679 Personal history of other diseases of the circulatory system: Secondary | ICD-10-CM

## 2022-10-11 DIAGNOSIS — Z8249 Family history of ischemic heart disease and other diseases of the circulatory system: Secondary | ICD-10-CM

## 2022-10-11 DIAGNOSIS — Z87442 Personal history of urinary calculi: Secondary | ICD-10-CM

## 2022-10-11 DIAGNOSIS — Z9981 Dependence on supplemental oxygen: Secondary | ICD-10-CM

## 2022-10-11 DIAGNOSIS — N182 Chronic kidney disease, stage 2 (mild): Secondary | ICD-10-CM | POA: Diagnosis present

## 2022-10-11 DIAGNOSIS — Z66 Do not resuscitate: Secondary | ICD-10-CM | POA: Diagnosis not present

## 2022-10-11 DIAGNOSIS — N179 Acute kidney failure, unspecified: Secondary | ICD-10-CM | POA: Diagnosis present

## 2022-10-11 DIAGNOSIS — M4620 Osteomyelitis of vertebra, site unspecified: Secondary | ICD-10-CM | POA: Diagnosis present

## 2022-10-11 DIAGNOSIS — J90 Pleural effusion, not elsewhere classified: Secondary | ICD-10-CM | POA: Diagnosis not present

## 2022-10-11 DIAGNOSIS — I739 Peripheral vascular disease, unspecified: Secondary | ICD-10-CM | POA: Diagnosis present

## 2022-10-11 DIAGNOSIS — I130 Hypertensive heart and chronic kidney disease with heart failure and stage 1 through stage 4 chronic kidney disease, or unspecified chronic kidney disease: Secondary | ICD-10-CM | POA: Diagnosis present

## 2022-10-11 LAB — COMPREHENSIVE METABOLIC PANEL WITH GFR
ALT: 43 U/L (ref 0–44)
AST: 30 U/L (ref 15–41)
Albumin: 2.7 g/dL — ABNORMAL LOW (ref 3.5–5.0)
Alkaline Phosphatase: 72 U/L (ref 38–126)
Anion gap: 14 (ref 5–15)
BUN: 32 mg/dL — ABNORMAL HIGH (ref 8–23)
CO2: 22 mmol/L (ref 22–32)
Calcium: 8.5 mg/dL — ABNORMAL LOW (ref 8.9–10.3)
Chloride: 101 mmol/L (ref 98–111)
Creatinine, Ser: 1.62 mg/dL — ABNORMAL HIGH (ref 0.61–1.24)
GFR, Estimated: 42 mL/min — ABNORMAL LOW
Glucose, Bld: 125 mg/dL — ABNORMAL HIGH (ref 70–99)
Potassium: 4.2 mmol/L (ref 3.5–5.1)
Sodium: 137 mmol/L (ref 135–145)
Total Bilirubin: 0.2 mg/dL — ABNORMAL LOW (ref 0.3–1.2)
Total Protein: 5.8 g/dL — ABNORMAL LOW (ref 6.5–8.1)

## 2022-10-11 LAB — CBC WITH DIFFERENTIAL/PLATELET
Abs Immature Granulocytes: 0.1 10*3/uL — ABNORMAL HIGH (ref 0.00–0.07)
Basophils Absolute: 0 10*3/uL (ref 0.0–0.1)
Basophils Relative: 0 %
Eosinophils Absolute: 0 10*3/uL (ref 0.0–0.5)
Eosinophils Relative: 0 %
HCT: 27.8 % — ABNORMAL LOW (ref 39.0–52.0)
Hemoglobin: 8.7 g/dL — ABNORMAL LOW (ref 13.0–17.0)
Immature Granulocytes: 1 %
Lymphocytes Relative: 10 %
Lymphs Abs: 0.7 10*3/uL (ref 0.7–4.0)
MCH: 29.5 pg (ref 26.0–34.0)
MCHC: 31.3 g/dL (ref 30.0–36.0)
MCV: 94.2 fL (ref 80.0–100.0)
Monocytes Absolute: 0.1 10*3/uL (ref 0.1–1.0)
Monocytes Relative: 2 %
Neutro Abs: 6.2 10*3/uL (ref 1.7–7.7)
Neutrophils Relative %: 87 %
Platelets: 135 10*3/uL — ABNORMAL LOW (ref 150–400)
RBC: 2.95 MIL/uL — ABNORMAL LOW (ref 4.22–5.81)
RDW: 17.8 % — ABNORMAL HIGH (ref 11.5–15.5)
WBC: 7.2 10*3/uL (ref 4.0–10.5)
nRBC: 0 % (ref 0.0–0.2)

## 2022-10-11 LAB — TROPONIN I (HIGH SENSITIVITY)
Troponin I (High Sensitivity): 28 ng/L — ABNORMAL HIGH
Troponin I (High Sensitivity): 27 ng/L — ABNORMAL HIGH

## 2022-10-11 LAB — BRAIN NATRIURETIC PEPTIDE: B Natriuretic Peptide: 331.8 pg/mL — ABNORMAL HIGH (ref 0.0–100.0)

## 2022-10-11 LAB — CK: Total CK: 31 U/L — ABNORMAL LOW (ref 49–397)

## 2022-10-11 MED ORDER — SODIUM CHLORIDE 0.9 % IV SOLN
2.0000 g | Freq: Once | INTRAVENOUS | Status: AC
  Administered 2022-10-11: 2 g via INTRAVENOUS
  Filled 2022-10-11: qty 12.5

## 2022-10-11 MED ORDER — VANCOMYCIN HCL IN DEXTROSE 1-5 GM/200ML-% IV SOLN: 1000.0000 mg | Freq: Once | INTRAVENOUS | Status: DC

## 2022-10-11 MED ORDER — IOHEXOL 350 MG/ML SOLN
65.0000 mL | Freq: Once | INTRAVENOUS | Status: AC | PRN
  Administered 2022-10-11: 65 mL via INTRAVENOUS

## 2022-10-11 MED ORDER — VANCOMYCIN HCL 1500 MG/300ML IV SOLN
1500.0000 mg | Freq: Once | INTRAVENOUS | Status: AC
  Administered 2022-10-12: 1500 mg via INTRAVENOUS
  Filled 2022-10-11: qty 300

## 2022-10-11 NOTE — ED Provider Notes (Signed)
[place] EMERGENCY DEPARTMENT AT [facility] Provider Note  MDM   HPI/ROS:  Brett Riley is a 87 y.o. male with hypertension, CAD, heart failure, atrial fibrillation on Eliquis, recent hospitalization for MSSA bacteremia and vertebral osteomyelitis presenting from facility with chief complaint of right flank ecchymosis.  Patient feels that he has been in his normal state of health as of recently, but staff at facility have noticed that his right flank ecchymosis.  Patient also endorses some ongoing shortness of breath that has been present since last admission.  Physical exam is notable for: - Large ecchymosis on right flank - Cardiopulmonary exam benign - No significant abdominal tenderness to palpation - No significant tenderness to C, T, L-spine - Diffuse pitting edema along entirety of bilateral lower extremities - Large right flank bruise, mildly tender to palpation  On my initial evaluation, patient is:  -Vital signs stable. Patient afebrile, hemodynamically stable, and non-toxic appearing. -Additional history obtained from chart review  Given the patient's history and physical exam, differential diagnosis includes but is not limited to retroperitoneal hematoma, ongoing vertebral osteomyelitis, sepsis, urinary tract infection, AKI, rhabdo, etc.  Interpretations, interventions, and the patient's course of care are documented below.    Initial workup to include EKG, CBC, CMP, BNP, troponin, CK, noncontrast abdominal CT given recent AKI.  Laboratory workup resulted largely within normal limits.  Creatinine down to 1.6 from 3 during recent admission.  CT resulted with no acute findings.  Given normalizing renal function with inconclusive CT scan, contrasted scan ordered to further evaluate.  Demonstrated evidence of worsening airspace disease concerning for hospital acquired pneumonia.  Cultures and lactic acid ordered.  Broad-spectrum antibiotics administered.  Posted for  admission to hospitalist service for hospital-acquired pneumonia.  Disposition:  I discussed the case with hospitalist who graciously agreed to admit the patient to their service for continued care.   Clinical Impression:  1. HCAP (healthcare-associated pneumonia)     Rx / DC Orders ED Discharge Orders     None       The plan for this patient was discussed with Dr. Silverio Lay, who voiced agreement and who oversaw evaluation and treatment of this patient.   Clinical Complexity A medically appropriate history, review of systems, and physical exam was performed.  My independent interpretations of EKG, labs, and radiology are documented in the ED course above.   Click here for ABCD2, HEART and other calculatorsREFRESH Note before signing   Patient's presentation is most consistent with acute presentation with potential threat to life or bodily function.  Medical Decision Making Amount and/or Complexity of Data Reviewed Labs: ordered. Radiology: ordered. ECG/medicine tests: ordered.  Risk Prescription drug management. Decision regarding hospitalization.    HPI/ROS      See MDM section for pertinent HPI and ROS. A complete ROS was performed with pertinent positives/negatives noted above.   Past Medical History:  Diagnosis Date   Cancer [facility])    skin cancer basal   Carotid arterial disease (HCC)    a. 02/2016 L CEA; b. 01/2017 U/S: patent LICA, 1-39% RICA.   Celiac artery stenosis (HCC)    Coronary artery disease    a. 01/2016 MV: mild apical/basal inferior, apical lateral, mid anterolateral, and mid inferolateral ischemia. EF 57%; b. 02/2016 Cath: LM 40ost, LAD 70p/m, 20d, D1 95 (small), LCX nl, RCA 72m, RPDA 90 (small), EF 55-65%-->med Rx. Rec CABG for recurrent symptoms.   Hearing loss    History of echocardiogram    a. 02/2016 Echo:  EF 50-55%, no rwma, mild MR.   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   Osteoarthritis, hip, bilateral    Left > Right    Past Surgical History:  Procedure Laterality Date   AORTIC INTERVENTION N/A 07/24/2022   Procedure: AORTIC INTERVENTION;  Surgeon: Renford Dills, MD;  Location: [facility] INVASIVE CV LAB;  Service: Cardiovascular;  Laterality: N/A;   CARDIAC CATHETERIZATION N/A 01/19/2016   Procedure: Left Heart Cath and Coronary Angiography;  Surgeon: Iran Ouch, MD;  Location: [facility] INVASIVE CV LAB;  Service: Cardiovascular;  Laterality: N/A;   COLONOSCOPY     CYSTOSCOPY/URETEROSCOPY/HOLMIUM LASER/STENT PLACEMENT Right 04/13/2021   Procedure: CYSTOSCOPY/URETEROSCOPY/HOLMIUM LASER/STENT PLACEMENT;  Surgeon: Sondra Come, MD;  Location: [facility] ORS;  Service: Urology;  Laterality: Right;   ENDARTERECTOMY Left 02/15/2016   Procedure: ENDARTERECTOMY CAROTID;  Surgeon: Annice Needy, MD;  Location: [facility] ORS;  Service: Vascular;  Laterality: Left;   RIGHT HEART CATH N/A 09/19/2022   Procedure: RIGHT HEART CATH;  Surgeon: Yvonne Kendall, MD;  Location: [facility] INVASIVE CV LAB;  Service: Cardiovascular;  Laterality: N/A;   TEE WITHOUT CARDIOVERSION N/A 08/05/2022   Procedure: TRANSESOPHAGEAL ECHOCARDIOGRAM;  Surgeon: Debbe Odea, MD;  Location: [facility] ORS;  Service: Cardiovascular;  Laterality: N/A;      Physical Exam   Vitals:   10/12/22 0243 10/12/22 0400 10/12/22 0700 10/12/22 1127  BP: (!) 160/82 (!) 154/79 (!) 151/120 117/84  Pulse: 73 63 68 74  Resp: 20 16 18  (!) 25  Temp: 98.2 F (36.8 C)  98.2 F (36.8 C) 98.2 F (36.8 C)  TempSrc: Oral  Oral Oral  SpO2: 100% 100% 100% 98%  Weight: 80.7 kg     Height: 6' (1.829 m)       Physical Exam Vitals and nursing note reviewed.  Constitutional:      General: He is not in acute distress.    Appearance: He is well-developed.  HENT:     Head: Normocephalic and atraumatic.  Eyes:     Conjunctiva/sclera: Conjunctivae normal.  Cardiovascular:     Rate and Rhythm: Normal rate and  regular rhythm.     Heart sounds: No murmur heard. Pulmonary:     Effort: Pulmonary effort is normal. No respiratory distress.     Breath sounds: Normal breath sounds.  Abdominal:     Palpations: Abdomen is soft.     Tenderness: There is no abdominal tenderness. There is right CVA tenderness.     Comments: Large right flank bruise  Musculoskeletal:        General: No swelling.     Cervical back: Neck supple.     Right lower leg: Edema present.     Left lower leg: Edema present.  Skin:    General: Skin is warm and dry.     Capillary Refill: Capillary refill takes less than 2 seconds.  Neurological:     Mental Status: He is alert.  Psychiatric:        Mood and Affect: Mood normal.    Starleen Arms, MD Department of Emergency Medicine   Please note that this documentation was produced with the assistance of voice-to-text technology and may contain errors.    Dyanne Iha, MD 10/12/22 1334    Charlynne Pander, MD 10/12/22 1455

## 2022-10-11 NOTE — ED Provider Notes (Incomplete)
[place] EMERGENCY DEPARTMENT AT [facility] Provider Note  MDM   HPI/ROS:  Brett Riley is a 87 y.o. male with hypertension, CAD, heart failure, atrial fibrillation on Eliquis, recent hospitalization for MSSA bacteremia and vertebral osteomyelitis presenting from facility with chief complaint of right flank ecchymosis.  Patient feels that he has been in his normal state of health as of recently, but staff at facility have noticed that his right flank ecchymosis.  Patient also endorses some ongoing shortness of breath that has been present since last admission.  Physical exam is notable for: - Large ecchymosis on right flank - Cardiopulmonary exam benign - No significant abdominal tenderness to palpation - No significant tenderness to C, T, L-spine - Diffuse pitting edema along entirety of bilateral lower extremities - Approximately 6 x 6 cm mass overlying right hip trochanter.  Consistent with single focus of dependent pitting edema  On my initial evaluation, patient is:  -Vital signs stable. Patient afebrile, hemodynamically stable, and non-toxic appearing. -Additional history obtained from chart review  Given the patient's history and physical exam, differential diagnosis includes but is not limited to retroperitoneal hematoma, ongoing vertebral osteomyelitis, sepsis, urinary tract infection, AKI, rhabdo, etc.  Interpretations, interventions, and the patient's course of care are documented below.    Initial workup to include EKG, CBC, CMP, BNP, troponin, CK  Disposition:  {ED Dispo:29898}  Clinical Impression: No diagnosis found.  Rx / DC Orders ED Discharge Orders     None       The plan for this patient was discussed with Dr. ***, who voiced agreement and who oversaw evaluation and treatment of this patient.   Clinical Complexity A medically appropriate history, review of systems, and physical exam was performed.  My independent interpretations of EKG,  labs, and radiology are documented in the ED course above.   Click here for ABCD2, HEART and other calculatorsREFRESH Note before signing   Patient's presentation is most consistent with {EM COPA:27473}  Medical Decision Making Amount and/or Complexity of Data Reviewed Labs: ordered. Radiology: ordered. ECG/medicine tests: ordered.  Risk Prescription drug management.    HPI/ROS      See MDM section for pertinent HPI and ROS. A complete ROS was performed with pertinent positives/negatives noted above.   Past Medical History:  Diagnosis Date  . Cancer (HCC)    skin cancer basal  . Carotid arterial disease (HCC)    a. 02/2016 L CEA; b. 01/2017 U/S: patent LICA, 1-39% RICA.  Marland Kitchen Celiac artery stenosis (HCC)   . Coronary artery disease    a. 01/2016 MV: mild apical/basal inferior, apical lateral, mid anterolateral, and mid inferolateral ischemia. EF 57%; b. 02/2016 Cath: LM 40ost, LAD 70p/m, 20d, D1 95 (small), LCX nl, RCA 73m, RPDA 90 (small), EF 55-65%-->med Rx. Rec CABG for recurrent symptoms.  Marland Kitchen Hearing loss   . History of echocardiogram    a. 02/2016 Echo: EF 50-55%, no rwma, mild MR.  Marland Kitchen History of kidney stones   . Hypercholesterolemia   . Hypertension   . Intraosseous ganglion    3.3 cm left acetabulum  . Osteoarthritis, hip, bilateral    Left > Right    Past Surgical History:  Procedure Laterality Date  . AORTIC INTERVENTION N/A 07/24/2022   Procedure: AORTIC INTERVENTION;  Surgeon: Renford Dills, MD;  Location: [facility] INVASIVE CV LAB;  Service: Cardiovascular;  Laterality: N/A;  . CARDIAC CATHETERIZATION N/A 01/19/2016   Procedure: Left Heart Cath and Coronary Angiography;  Surgeon: Chelsea Aus  Kirke Corin, MD;  Location: [facility] INVASIVE CV LAB;  Service: Cardiovascular;  Laterality: N/A;  . COLONOSCOPY    . CYSTOSCOPY/URETEROSCOPY/HOLMIUM LASER/STENT PLACEMENT Right 04/13/2021   Procedure: CYSTOSCOPY/URETEROSCOPY/HOLMIUM LASER/STENT PLACEMENT;  Surgeon: Sondra Come,  MD;  Location: [facility] ORS;  Service: Urology;  Laterality: Right;  . ENDARTERECTOMY Left 02/15/2016   Procedure: ENDARTERECTOMY CAROTID;  Surgeon: Annice Needy, MD;  Location: [facility] ORS;  Service: Vascular;  Laterality: Left;  . RIGHT HEART CATH N/A 09/19/2022   Procedure: RIGHT HEART CATH;  Surgeon: Yvonne Kendall, MD;  Location: [facility] INVASIVE CV LAB;  Service: Cardiovascular;  Laterality: N/A;  . TEE WITHOUT CARDIOVERSION N/A 08/05/2022   Procedure: TRANSESOPHAGEAL ECHOCARDIOGRAM;  Surgeon: Debbe Odea, MD;  Location: [facility] ORS;  Service: Cardiovascular;  Laterality: N/A;      Physical Exam   Vitals:   10/11/22 1852 10/11/22 1852  BP:  (!) 157/76  Pulse:  73  Resp:  16  Temp:  98.2 F (36.8 C)  SpO2:  100%  Weight: 77 kg   Height: 6' (1.829 m)     Physical Exam   Procedures    Procedures   Starleen Arms, MD Department of Emergency Medicine   Please note that this documentation was produced with the assistance of voice-to-text technology and may contain errors.

## 2022-10-11 NOTE — ED Notes (Signed)
Patient transported to CT 

## 2022-10-11 NOTE — ED Triage Notes (Signed)
Pt bib PTAR from [facility] with bruising/hematoma to right flank pt is on blood thinners. Denies any injury. Pt is alert and oriented x 4. Denies any pain.

## 2022-10-12 ENCOUNTER — Encounter (HOSPITAL_COMMUNITY): Payer: Self-pay | Admitting: Internal Medicine

## 2022-10-12 DIAGNOSIS — R933 Abnormal findings on diagnostic imaging of other parts of digestive tract: Secondary | ICD-10-CM | POA: Diagnosis not present

## 2022-10-12 DIAGNOSIS — I739 Peripheral vascular disease, unspecified: Secondary | ICD-10-CM

## 2022-10-12 DIAGNOSIS — H9190 Unspecified hearing loss, unspecified ear: Secondary | ICD-10-CM | POA: Diagnosis present

## 2022-10-12 DIAGNOSIS — R54 Age-related physical debility: Secondary | ICD-10-CM | POA: Diagnosis present

## 2022-10-12 DIAGNOSIS — M4620 Osteomyelitis of vertebra, site unspecified: Secondary | ICD-10-CM

## 2022-10-12 DIAGNOSIS — I5032 Chronic diastolic (congestive) heart failure: Secondary | ICD-10-CM | POA: Diagnosis present

## 2022-10-12 DIAGNOSIS — T148XXA Other injury of unspecified body region, initial encounter: Secondary | ICD-10-CM | POA: Diagnosis not present

## 2022-10-12 DIAGNOSIS — R0602 Shortness of breath: Secondary | ICD-10-CM | POA: Diagnosis not present

## 2022-10-12 DIAGNOSIS — B9561 Methicillin susceptible Staphylococcus aureus infection as the cause of diseases classified elsewhere: Secondary | ICD-10-CM | POA: Diagnosis present

## 2022-10-12 DIAGNOSIS — Z8679 Personal history of other diseases of the circulatory system: Secondary | ICD-10-CM

## 2022-10-12 DIAGNOSIS — N183 Chronic kidney disease, stage 3 unspecified: Secondary | ICD-10-CM | POA: Insufficient documentation

## 2022-10-12 DIAGNOSIS — R932 Abnormal findings on diagnostic imaging of liver and biliary tract: Secondary | ICD-10-CM | POA: Diagnosis not present

## 2022-10-12 DIAGNOSIS — Y95 Nosocomial condition: Secondary | ICD-10-CM | POA: Diagnosis present

## 2022-10-12 DIAGNOSIS — J189 Pneumonia, unspecified organism: Secondary | ICD-10-CM | POA: Diagnosis present

## 2022-10-12 DIAGNOSIS — M160 Bilateral primary osteoarthritis of hip: Secondary | ICD-10-CM | POA: Diagnosis present

## 2022-10-12 DIAGNOSIS — N182 Chronic kidney disease, stage 2 (mild): Secondary | ICD-10-CM

## 2022-10-12 DIAGNOSIS — I2510 Atherosclerotic heart disease of native coronary artery without angina pectoris: Secondary | ICD-10-CM | POA: Diagnosis present

## 2022-10-12 DIAGNOSIS — D649 Anemia, unspecified: Secondary | ICD-10-CM | POA: Diagnosis present

## 2022-10-12 DIAGNOSIS — I480 Paroxysmal atrial fibrillation: Secondary | ICD-10-CM

## 2022-10-12 DIAGNOSIS — S301XXA Contusion of abdominal wall, initial encounter: Secondary | ICD-10-CM | POA: Diagnosis present

## 2022-10-12 DIAGNOSIS — Z9981 Dependence on supplemental oxygen: Secondary | ICD-10-CM | POA: Diagnosis not present

## 2022-10-12 DIAGNOSIS — I700 Atherosclerosis of aorta: Secondary | ICD-10-CM

## 2022-10-12 DIAGNOSIS — D696 Thrombocytopenia, unspecified: Secondary | ICD-10-CM

## 2022-10-12 DIAGNOSIS — Z66 Do not resuscitate: Secondary | ICD-10-CM | POA: Diagnosis not present

## 2022-10-12 DIAGNOSIS — J9611 Chronic respiratory failure with hypoxia: Secondary | ICD-10-CM | POA: Diagnosis present

## 2022-10-12 DIAGNOSIS — K6389 Other specified diseases of intestine: Secondary | ICD-10-CM | POA: Diagnosis not present

## 2022-10-12 DIAGNOSIS — I776 Arteritis, unspecified: Secondary | ICD-10-CM | POA: Diagnosis present

## 2022-10-12 DIAGNOSIS — I130 Hypertensive heart and chronic kidney disease with heart failure and stage 1 through stage 4 chronic kidney disease, or unspecified chronic kidney disease: Secondary | ICD-10-CM | POA: Diagnosis present

## 2022-10-12 DIAGNOSIS — K5900 Constipation, unspecified: Secondary | ICD-10-CM | POA: Diagnosis not present

## 2022-10-12 DIAGNOSIS — J90 Pleural effusion, not elsewhere classified: Secondary | ICD-10-CM | POA: Diagnosis not present

## 2022-10-12 DIAGNOSIS — N179 Acute kidney failure, unspecified: Secondary | ICD-10-CM | POA: Diagnosis present

## 2022-10-12 DIAGNOSIS — E7800 Pure hypercholesterolemia, unspecified: Secondary | ICD-10-CM | POA: Diagnosis present

## 2022-10-12 DIAGNOSIS — J470 Bronchiectasis with acute lower respiratory infection: Secondary | ICD-10-CM | POA: Diagnosis present

## 2022-10-12 DIAGNOSIS — Z7901 Long term (current) use of anticoagulants: Secondary | ICD-10-CM

## 2022-10-12 DIAGNOSIS — M4626 Osteomyelitis of vertebra, lumbar region: Secondary | ICD-10-CM | POA: Diagnosis present

## 2022-10-12 LAB — RESPIRATORY PANEL BY PCR

## 2022-10-12 LAB — URINALYSIS, ROUTINE W REFLEX MICROSCOPIC
Bilirubin Urine: NEGATIVE
Glucose, UA: NEGATIVE mg/dL
Ketones, ur: NEGATIVE mg/dL
Leukocytes,Ua: NEGATIVE
Nitrite: NEGATIVE
Protein, ur: 30 mg/dL — AB
Specific Gravity, Urine: 1.02 (ref 1.005–1.030)
pH: 7 (ref 5.0–8.0)

## 2022-10-12 LAB — MRSA NEXT GEN BY PCR, NASAL: MRSA by PCR Next Gen: NOT DETECTED

## 2022-10-12 LAB — HEMOGLOBIN AND HEMATOCRIT, BLOOD
HCT: 25.5 % — ABNORMAL LOW (ref 39.0–52.0)
Hemoglobin: 8.1 g/dL — ABNORMAL LOW (ref 13.0–17.0)

## 2022-10-12 LAB — LACTIC ACID, PLASMA: Lactic Acid, Venous: 1.9 mmol/L (ref 0.5–1.9)

## 2022-10-12 LAB — GLUCOSE, CAPILLARY: Glucose-Capillary: 108 mg/dL — ABNORMAL HIGH (ref 70–99)

## 2022-10-12 LAB — STREP PNEUMONIAE URINARY ANTIGEN: Strep Pneumo Urinary Antigen: NEGATIVE

## 2022-10-12 MED ORDER — SODIUM CHLORIDE 0.9 % IV SOLN: 250.0000 mL | INTRAVENOUS | Status: DC | PRN

## 2022-10-12 MED ORDER — FUROSEMIDE 10 MG/ML IJ SOLN
40.0000 mg | Freq: Once | INTRAMUSCULAR | Status: AC
  Administered 2022-10-12: 40 mg via INTRAVENOUS
  Filled 2022-10-12: qty 4

## 2022-10-12 MED ORDER — PREDNISONE 20 MG PO TABS
35.0000 mg | ORAL_TABLET | Freq: Every day | ORAL | Status: DC
  Administered 2022-10-14 – 2022-10-15 (×2): 35 mg via ORAL
  Filled 2022-10-12 (×2): qty 2

## 2022-10-12 MED ORDER — CLOPIDOGREL BISULFATE 75 MG PO TABS
75.0000 mg | ORAL_TABLET | Freq: Every day | ORAL | Status: DC
  Administered 2022-10-12 – 2022-10-15 (×4): 75 mg via ORAL
  Filled 2022-10-12 (×4): qty 1

## 2022-10-12 MED ORDER — POLYETHYLENE GLYCOL 3350 17 G PO PACK
17.0000 g | PACK | Freq: Every day | ORAL | Status: DC
  Administered 2022-10-12 – 2022-10-15 (×4): 17 g via ORAL
  Filled 2022-10-12 (×4): qty 1

## 2022-10-12 MED ORDER — SODIUM CHLORIDE 0.9% FLUSH
3.0000 mL | Freq: Two times a day (BID) | INTRAVENOUS | Status: DC
  Administered 2022-10-12 – 2022-10-15 (×7): 3 mL via INTRAVENOUS

## 2022-10-12 MED ORDER — PREDNISONE 20 MG PO TABS
40.0000 mg | ORAL_TABLET | Freq: Every day | ORAL | Status: AC
  Administered 2022-10-12 – 2022-10-13 (×2): 40 mg via ORAL
  Filled 2022-10-12 (×2): qty 2

## 2022-10-12 MED ORDER — PREDNISONE 10 MG PO TABS: 10.0000 mg | ORAL_TABLET | Freq: Every day | ORAL | Status: DC

## 2022-10-12 MED ORDER — METOPROLOL SUCCINATE ER 100 MG PO TB24
100.0000 mg | ORAL_TABLET | Freq: Every day | ORAL | Status: DC
  Administered 2022-10-12 – 2022-10-15 (×4): 100 mg via ORAL
  Filled 2022-10-12 (×4): qty 1

## 2022-10-12 MED ORDER — DILTIAZEM HCL ER COATED BEADS 180 MG PO CP24
360.0000 mg | ORAL_CAPSULE | Freq: Every day | ORAL | Status: DC
  Administered 2022-10-12 – 2022-10-15 (×4): 360 mg via ORAL
  Filled 2022-10-12 (×4): qty 2

## 2022-10-12 MED ORDER — ONDANSETRON HCL 4 MG/2ML IJ SOLN: 4.0000 mg | Freq: Four times a day (QID) | INTRAMUSCULAR | Status: DC | PRN

## 2022-10-12 MED ORDER — PREDNISONE 10 MG PO TABS: 5.0000 mg | ORAL_TABLET | Freq: Every day | ORAL | Status: DC

## 2022-10-12 MED ORDER — SULFAMETHOXAZOLE-TRIMETHOPRIM 800-160 MG PO TABS
1.0000 | ORAL_TABLET | ORAL | Status: DC
  Administered 2022-10-14: 1 via ORAL
  Filled 2022-10-12: qty 1

## 2022-10-12 MED ORDER — SODIUM CHLORIDE 0.9% FLUSH: 3.0000 mL | INTRAVENOUS | Status: DC | PRN

## 2022-10-12 MED ORDER — ACETAMINOPHEN 325 MG PO TABS: 650.0000 mg | ORAL_TABLET | Freq: Four times a day (QID) | ORAL | Status: DC | PRN

## 2022-10-12 MED ORDER — PREDNISONE 10 MG PO TABS: 15.0000 mg | ORAL_TABLET | Freq: Every day | ORAL | Status: DC

## 2022-10-12 MED ORDER — SENNA 8.6 MG PO TABS
1.0000 | ORAL_TABLET | Freq: Every day | ORAL | Status: DC
  Administered 2022-10-12 – 2022-10-15 (×4): 8.6 mg via ORAL
  Filled 2022-10-12 (×4): qty 1

## 2022-10-12 MED ORDER — ACETAMINOPHEN 650 MG RE SUPP: 650.0000 mg | Freq: Four times a day (QID) | RECTAL | Status: DC | PRN

## 2022-10-12 MED ORDER — HYDRALAZINE HCL 20 MG/ML IJ SOLN: 5.0000 mg | Freq: Three times a day (TID) | INTRAMUSCULAR | Status: DC | PRN

## 2022-10-12 MED ORDER — ROSUVASTATIN CALCIUM 5 MG PO TABS
10.0000 mg | ORAL_TABLET | Freq: Every day | ORAL | Status: DC
  Administered 2022-10-12 – 2022-10-14 (×3): 10 mg via ORAL
  Filled 2022-10-12 (×3): qty 2

## 2022-10-12 MED ORDER — ENSURE ENLIVE PO LIQD
237.0000 mL | Freq: Three times a day (TID) | ORAL | Status: DC
  Administered 2022-10-12: 237 mL via ORAL

## 2022-10-12 MED ORDER — ONDANSETRON HCL 4 MG PO TABS: 4.0000 mg | ORAL_TABLET | Freq: Four times a day (QID) | ORAL | Status: DC | PRN

## 2022-10-12 MED ORDER — SODIUM CHLORIDE 0.9 % IV SOLN
1.0000 g | INTRAVENOUS | Status: DC
  Administered 2022-10-12 – 2022-10-14 (×3): 1 g via INTRAVENOUS
  Filled 2022-10-12 (×3): qty 10

## 2022-10-12 MED ORDER — APIXABAN 2.5 MG PO TABS
2.5000 mg | ORAL_TABLET | Freq: Two times a day (BID) | ORAL | Status: DC
  Administered 2022-10-12 – 2022-10-15 (×7): 2.5 mg via ORAL
  Filled 2022-10-12 (×7): qty 1

## 2022-10-12 MED ORDER — TAB-A-VITE/IRON PO TABS
1.0000 | ORAL_TABLET | Freq: Every day | ORAL | Status: DC
  Administered 2022-10-12 – 2022-10-15 (×4): 1 via ORAL
  Filled 2022-10-12 (×4): qty 1

## 2022-10-12 MED ORDER — PREDNISONE 20 MG PO TABS: 25.0000 mg | ORAL_TABLET | Freq: Every day | ORAL | Status: DC

## 2022-10-12 MED ORDER — DOXYCYCLINE HYCLATE 100 MG PO TABS
100.0000 mg | ORAL_TABLET | Freq: Two times a day (BID) | ORAL | Status: DC
  Administered 2022-10-12 – 2022-10-15 (×7): 100 mg via ORAL
  Filled 2022-10-12 (×7): qty 1

## 2022-10-12 MED ORDER — PANTOPRAZOLE SODIUM 40 MG PO TBEC
40.0000 mg | DELAYED_RELEASE_TABLET | Freq: Every day | ORAL | Status: DC
  Administered 2022-10-12 – 2022-10-15 (×4): 40 mg via ORAL
  Filled 2022-10-12 (×4): qty 1

## 2022-10-12 MED ORDER — SENNOSIDES-DOCUSATE SODIUM 8.6-50 MG PO TABS: 1.0000 | ORAL_TABLET | Freq: Every evening | ORAL | Status: DC | PRN

## 2022-10-12 MED ORDER — PREDNISONE 20 MG PO TABS: 20.0000 mg | ORAL_TABLET | Freq: Every day | ORAL | Status: DC

## 2022-10-12 MED ORDER — PREDNISONE 20 MG PO TABS: 30.0000 mg | ORAL_TABLET | Freq: Every day | ORAL | Status: DC

## 2022-10-12 NOTE — Consult Note (Addendum)
Consultation Note   Referring Provider:  [facility] PCP: Dale [place], MD Primary Gastroenterologist: [facility] - remote history        Reason for Consultation: abnormal colon on CT scan  DOA: 10/11/2022         Hospital Day: 2   ASSESSMENT & PLAN   Patient is a 87 y.o. year old male with multiple medical problems including HTN, HLD, CAD, diastolic heart failure,  PAD, carotid artery disease on asa and plavix, Afib on Eliquis, CKD 3a, chronic hypoxic respiratory failure not on 02 at  home  Bilateral lower lobe PNA ( on admission)  Right flank hematoma in setting of plavix / eliquis .No recent trauma. No pain in area. Nothing on CT scan to explain the bruising  Gaseous distention of colon without volvulus on CT scan. He is asymptomatic.  -Findings probably not clinically significant but Dr. Leone Payor will review images  Chronic diastolic heart failure with preserved EF Pitting edema of bilateral  lower extremities  PAF, on Eliquis  CAD and PAD, on Plavix.  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 Overall measurement at this level is 4.8 by 3.6 cm. This appears unchanged from prior.  Stable density adjacent to the anterior neck of the pancreas, possibly an enlarged lymph node or exophytic lesion from the pancreas.  Thrombocytopenia, chronic and intermittent. Stable Normal size spleen on CT scan  Advanced narrowing at the origins of the celiac axis and SMA on CT scan from May 2024. IMA patent  See PMH for additional medical history   ----------------------------------------------------------------------------------------------------    Brett Riley   I have taken an interval history, reviewed the chart and examined the patient. I agree with the Advanced Practitioner's note, impression and recommendations with the following additions:  I do not think the  colonic dilation is clinically significant. He has adequate bowel function, he is defecating.  Also this was present on 7/15 AXR.  Signing off - continue current care, call back if ?  Iva Boop, MD, [facility] See Loretha Stapler on call - gastroenterology for best contact person 10/12/2022 11:47 AM     HISTORY OF PRESENT ILLNESS   Patient present from SNF to ED yesterday with right flank bruising  ED / Admission workup notable for :  -Cr 1.62 -BNP 331 -Troponin 28 -WBC 7.2 -Hgb 8.7 ( 9.6 two weeks ago) -platelets 135 -Normal Na and K+ -Respiratory panel neg  CT chest / abd / pelvis with contrast Summary No focal liver abnormality is seen. No gallstones, gallbladder wall thickening, or biliary dilatation. Pancreas: Unremarkable. No pancreatic ductal dilatation or surrounding inflammatory changes. Stable density adjacent to the anterior neck of the pancreas, possibly an enlarged lymph node or exophytic lesion from the pancreas.Spleen: Normal in size without focal abnormality New airspace disease in the right lower lobe concerning for pneumonia.Additional ground-glass opacities in the right lower lobe, lingula and left lower lobe have increased. Findings may be infectious/inflammatory. Gaseous distention of the sigmoid colon without definite evidence for volvulus. Mild body wall edema.Stable focal aneurysm versus dissection of the proximal aortic arch measuring up to 4.8 cm   Brett Riley has no GI complaints. BMs generally  regular. No abdominal pain. No N/V. No blood in stool.    Previous GI Evaluations  Possibly a colonoscopy in 2015 at outside facility   Labs and Imaging: Recent Labs    10/11/22 1939  WBC 7.2  HGB 8.7*  HCT 27.8*  PLT 135*   Recent Labs    10/11/22 1939  NA 137  K 4.2  CL 101  CO2 22  GLUCOSE 125*  BUN 32*  CREATININE 1.62*  CALCIUM 8.5*   Recent Labs    10/11/22 1939  PROT 5.8*  ALBUMIN 2.7*  AST 30  ALT 43  ALKPHOS  72  BILITOT 0.2*   No results for input(s): "HEPBSAG", "HCVAB", "HEPAIGM", "HEPBIGM" in the last 72 hours. No results for input(s): "LABPROT", "INR" in the last 72 hours.    Past Medical History:  Diagnosis Date   Cancer [facility])    skin cancer basal   Carotid arterial disease (HCC)    a. 02/2016 L CEA; b. 01/2017 U/S: patent LICA, 1-39% RICA.   Celiac artery stenosis (HCC)    Coronary artery disease    a. 01/2016 MV: mild apical/basal inferior, apical lateral, mid anterolateral, and mid inferolateral ischemia. EF 57%; b. 02/2016 Cath: LM 40ost, LAD 70p/m, 20d, D1 95 (small), LCX nl, RCA 88m, RPDA 90 (small), EF 55-65%-->med Rx. Rec CABG for recurrent symptoms.   Hearing loss    History of echocardiogram    a. 02/2016 Echo: EF 50-55%, no rwma, mild Brett.   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   Osteoarthritis, hip, bilateral    Left > Right    Past Surgical History:  Procedure Laterality Date   AORTIC INTERVENTION N/A 07/24/2022   Procedure: AORTIC INTERVENTION;  Surgeon: Renford Dills, MD;  Location: [facility] INVASIVE CV LAB;  Service: Cardiovascular;  Laterality: N/A;   CARDIAC CATHETERIZATION N/A 01/19/2016   Procedure: Left Heart Cath and Coronary Angiography;  Surgeon: Iran Ouch, MD;  Location: [facility] INVASIVE CV LAB;  Service: Cardiovascular;  Laterality: N/A;   COLONOSCOPY     CYSTOSCOPY/URETEROSCOPY/HOLMIUM LASER/STENT PLACEMENT Right 04/13/2021   Procedure: CYSTOSCOPY/URETEROSCOPY/HOLMIUM LASER/STENT PLACEMENT;  Surgeon: Sondra Come, MD;  Location: [facility] ORS;  Service: Urology;  Laterality: Right;   ENDARTERECTOMY Left 02/15/2016   Procedure: ENDARTERECTOMY CAROTID;  Surgeon: Annice Needy, MD;  Location: [facility] ORS;  Service: Vascular;  Laterality: Left;   RIGHT HEART CATH N/A 09/19/2022   Procedure: RIGHT HEART CATH;  Surgeon: Yvonne Kendall, MD;  Location: [facility] INVASIVE CV LAB;   Service: Cardiovascular;  Laterality: N/A;   TEE WITHOUT CARDIOVERSION N/A 08/05/2022   Procedure: TRANSESOPHAGEAL ECHOCARDIOGRAM;  Surgeon: Debbe Odea, MD;  Location: [facility] ORS;  Service: Cardiovascular;  Laterality: N/A;    Family History  Problem Relation Age of Onset   Stroke Mother    Heart disease Father        MI   Heart attack Father    Breast cancer Sister    Colon cancer Neg Hx    Prostate cancer Neg Hx     Prior to Admission medications   Medication Sig Start Date End Date Taking? Authorizing Provider  acidophilus (RISAQUAD) CAPS capsule Take 1 capsule by mouth daily. 09/26/22  Yes Marcelino Duster, MD  apixaban (ELIQUIS) 2.5 MG TABS tablet Take 1 tablet (2.5 mg total) by mouth 2 (two) times daily. 09/25/22  Yes Marcelino Duster, MD  clopidogrel (PLAVIX) 75  MG tablet TAKE 1 TABLET BY MOUTH EVERY DAY WITH BREAKFAST 08/29/22  Yes Dale [place], MD  diltiazem (CARDIZEM CD) 360 MG 24 hr capsule Take 1 capsule (360 mg total) by mouth daily. 08/09/22  Yes Delfino Lovett, MD  doxycycline (VIBRAMYCIN) 50 MG capsule Take 2 capsules (100 mg total) by mouth 2 (two) times daily. Patient taking differently: Take 100 mg by mouth 2 (two) times daily. For 6 weeks 09/25/22 10/24/22 Yes Marcelino Duster, MD  feeding supplement (ENSURE ENLIVE / ENSURE PLUS) LIQD Take 237 mLs by mouth 2 (two) times daily between meals. Patient taking differently: Take 237 mLs by mouth 3 (three) times daily after meals. 08/09/22  Yes Delfino Lovett, MD  hydrALAZINE (APRESOLINE) 25 MG tablet Take 25 mg by mouth every 6 (six) hours as needed (SBP >160).   Yes [provider]  melatonin 5 MG TABS Take 0.5 tablets (2.5 mg total) by mouth at bedtime as needed. 09/25/22  Yes Marcelino Duster, MD  metoprolol succinate (TOPROL-XL) 100 MG 24 hr tablet TAKE 1 TABLET BY MOUTH EVERY DAY WITH A MEAL 08/29/22  Yes Dale [place], MD  Multiple Vitamins-Iron (MULTIVITAMINS WITH IRON) TABS Take 1 tablet by  mouth daily.   Yes [provider]  naloxone (NARCAN) nasal spray 4 mg/0.1 mL Place 4 mg into the nose 3 (three) times daily as needed (opioid overdose).   Yes [provider]  pantoprazole (PROTONIX) 40 MG tablet Take 1 tablet (40 mg total) by mouth daily. 08/09/22  Yes Delfino Lovett, MD  polyethylene glycol (MIRALAX / GLYCOLAX) 17 g packet Take 17 g by mouth daily.   Yes [provider]  predniSONE (DELTASONE) 5 MG tablet Take 10 tablets (50 mg total) by mouth daily with breakfast for 5 days, THEN 9 tablets (45 mg total) daily with breakfast for 7 days, THEN 8 tablets (40 mg total) daily with breakfast for 7 days, THEN 7 tablets (35 mg total) daily with breakfast for 7 days, THEN 6 tablets (30 mg total) daily with breakfast for 7 days, THEN 5 tablets (25 mg total) daily with breakfast for 7 days, THEN 4 tablets (20 mg total) daily with breakfast for 7 days, THEN 3 tablets (15 mg total) daily with breakfast for 7 days, THEN 2 tablets (10 mg total) daily with breakfast for 7 days, THEN 1 tablet (5 mg total) daily with breakfast for 7 days. 09/25/22 12/02/22 Yes Marcelino Duster, MD  rosuvastatin (CRESTOR) 10 MG tablet Take 1 tablet (10 mg total) by mouth daily. 02/14/22  Yes Dale [place], MD  senna (SENOKOT) 8.6 MG TABS tablet Take 1 tablet by mouth daily.   Yes [provider]  sodium chloride (OCEAN) 0.65 % SOLN nasal spray Place 1 spray into both nostrils as needed for congestion. 08/09/22  Yes Delfino Lovett, MD  sulfamethoxazole-trimethoprim (BACTRIM DS) 800-160 MG tablet Take 1 tablet by mouth 3 (three) times a week. Patient taking differently: Take 1 tablet by mouth every Monday, Wednesday, and Friday. 09/25/22  Yes Marcelino Duster, MD  traMADol (ULTRAM) 50 MG tablet Take 50 mg by mouth every 8 (eight) hours as needed for moderate pain.   Yes [provider]  traMADol HCl 100 MG TABS Take 100 mg by mouth every 12 (twelve) hours as needed (severe pain).    Yes [provider]  vitamin B-12 (CYANOCOBALAMIN) 1000 MCG tablet Take 1,000 mcg by mouth daily.   Yes [provider]  amLODipine (NORVASC) 5 MG tablet TAKE 1 TABLET (5 MG  TOTAL) BY MOUTH DAILY. Patient not taking: Reported on 10/12/2022 10/04/22   Dale [place]mann, MD    Current Facility-Administered Medications  Medication Dose Route Frequency Provider Last Rate Last Admin   0.9 %  sodium chloride infusion  250 mL Intravenous PRN Janalyn Shy, Subrina, MD       acetaminophen (TYLENOL) tablet 650 mg  650 mg Oral Q6H PRN Janalyn Shy, Subrina, MD       Or   acetaminophen (TYLENOL) suppository 650 mg  650 mg Rectal Q6H PRN Janalyn Shy, Subrina, MD       apixaban Everlene Balls) tablet 2.5 mg  2.5 mg Oral BID Sundil, Subrina, MD   2.5 mg at 10/12/22 0935   cefTRIAXone (ROCEPHIN) 1 g in sodium chloride 0.9 % 100 mL IVPB  1 g Intravenous Q24H Sundil, Subrina, MD 200 mL/hr at 10/12/22 0955 1 g at 10/12/22 0955   clopidogrel (PLAVIX) tablet 75 mg  75 mg Oral Daily Sundil, Subrina, MD   75 mg at 10/12/22 0935   diltiazem (CARDIZEM CD) 24 hr capsule 360 mg  360 mg Oral Daily Sundil, Subrina, MD   360 mg at 10/12/22 0934   doxycycline (VIBRA-TABS) tablet 100 mg  100 mg Oral Q12H Sundil, Subrina, MD   100 mg at 10/12/22 0934   feeding supplement (ENSURE ENLIVE / ENSURE PLUS) liquid 237 mL  237 mL Oral TID BM Sundil, Subrina, MD   237 mL at 10/12/22 0944   hydrALAZINE (APRESOLINE) injection 5 mg  5 mg Intravenous Q8H PRN Janalyn Shy, Subrina, MD       metoprolol succinate (TOPROL-XL) 24 hr tablet 100 mg  100 mg Oral Daily Sundil, Subrina, MD   100 mg at 10/12/22 0935   multivitamins with iron tablet 1 tablet  1 tablet Oral Daily Janalyn Shy, Subrina, MD   1 tablet at 10/12/22 0935   ondansetron (ZOFRAN) tablet 4 mg  4 mg Oral Q6H PRN Janalyn Shy, Subrina, MD       Or   ondansetron [facility]) injection 4 mg  4 mg Intravenous Q6H PRN Janalyn Shy, Subrina, MD       pantoprazole (PROTONIX) EC tablet 40 mg  40 mg Oral Daily Sundil,  Subrina, MD   40 mg at 10/12/22 0935   polyethylene glycol (MIRALAX / GLYCOLAX) packet 17 g  17 g Oral Daily Sundil, Subrina, MD   17 g at 10/12/22 0935   predniSONE (DELTASONE) tablet 40 mg  40 mg Oral Q breakfast Janalyn Shy, Subrina, MD   40 mg at 10/12/22 5638   Followed by   Melene Muller ON 10/14/2022] predniSONE (DELTASONE) tablet 35 mg  35 mg Oral Q breakfast Sundil, Subrina, MD       Followed by   Melene Muller ON 10/21/2022] predniSONE (DELTASONE) tablet 30 mg  30 mg Oral Q breakfast Sundil, Subrina, MD       Followed by   Melene Muller ON 10/28/2022] predniSONE (DELTASONE) tablet 25 mg  25 mg Oral Q breakfast Sundil, Subrina, MD       Followed by   Melene Muller ON 11/04/2022] predniSONE (DELTASONE) tablet 20 mg  20 mg Oral Q breakfast Sundil, Subrina, MD       Followed by   Melene Muller ON 11/11/2022] predniSONE (DELTASONE) tablet 15 mg  15 mg Oral Q breakfast Sundil, Subrina, MD       Followed by   Melene Muller ON 11/18/2022] predniSONE (DELTASONE) tablet 10 mg  10 mg Oral Q breakfast Janalyn Shy, Subrina, MD       Followed by   Melene Muller ON 11/25/2022]  predniSONE (DELTASONE) tablet 5 mg  5 mg Oral Q breakfast Sundil, Subrina, MD       rosuvastatin (CRESTOR) tablet 10 mg  10 mg Oral QHS Sundil, Subrina, MD       senna (SENOKOT) tablet 8.6 mg  1 tablet Oral Daily Sundil, Subrina, MD   8.6 mg at 10/12/22 8119   senna-docusate (Senokot-S) tablet 1 tablet  1 tablet Oral QHS PRN Janalyn Shy, Subrina, MD       sodium chloride flush (NS) 0.9 % injection 3 mL  3 mL Intravenous Q12H Sundil, Subrina, MD   3 mL at 10/12/22 0956   sodium chloride flush (NS) 0.9 % injection 3 mL  3 mL Intravenous PRN Janalyn Shy Subrina, MD       Melene Muller ON 10/14/2022] sulfamethoxazole-trimethoprim (BACTRIM DS) 800-160 MG per tablet 1 tablet  1 tablet Oral Once per day on Monday Wednesday Friday Janalyn Shy, Subrina, MD        Allergies as of 10/11/2022   (No Known Allergies)    Social History   Socioeconomic History   Marital status: Married    Spouse name: Not on file    Number of children: 1   Years of education: Not on file   Highest education level: Not on file  Occupational History    Employer: [employer]  Tobacco Use   Smoking status: Never   Smokeless tobacco: Never  Vaping Use   Vaping status: Never Used  Substance and Sexual Activity   Alcohol use: No    Alcohol/week: 0.0 standard drinks of alcohol   Drug use: No   Sexual activity: Not Currently  Other Topics Concern   Not on file  Social History Narrative   Not on file   Social Determinants of Health   Financial Resource Strain: Low Risk  (08/29/2020)   Overall Financial Resource Strain (CARDIA)    Difficulty of Paying Living Expenses: Not hard at all  Food Insecurity: No Food Insecurity (08/18/2022)   Hunger Vital Sign    Worried About Running Out of Food in the Last Year: Never true    Ran Out of Food in the Last Year: Never true  Transportation Needs: No Transportation Needs (08/18/2022)   PRAPARE - Administrator, Civil Service (Medical): No    Lack of Transportation (Non-Medical): No  Physical Activity: Insufficiently Active (08/29/2020)   Exercise Vital Sign    Days of Exercise per Week: 3 days    Minutes of Exercise per Session: 20 min  Stress: No Stress Concern Present (08/29/2020)   [employer] of Occupational Health - Occupational Stress Questionnaire    Feeling of Stress : Not at all  Social Connections: Unknown (08/29/2020)   Social Connection and Isolation Panel [NHANES]    Frequency of Communication with Friends and Family: Not on file    Frequency of Social Gatherings with Friends and Family: Not on file    Attends Religious Services: Not on file    Active Member of Clubs or Organizations: Not on file    Attends Banker Meetings: Not on file    Marital Status: Married  Intimate Partner Violence: Not At Risk (08/18/2022)   Humiliation, Afraid, Rape, and Kick questionnaire    Fear of Current or Ex-Partner: No    Emotionally Abused: No     Physically Abused: No    Sexually Abused: No     Code Status   Code Status: Full Code  Review of Systems: All systems reviewed and negative  except where noted in HPI.  Physical Exam: Vital signs in last 24 hours: Temp:  [98 F (36.7 C)-98.2 F (36.8 C)] 98.2 F (36.8 C) (08/10 0700) Pulse Rate:  [63-73] 68 (08/10 0700) Resp:  [16-20] 18 (08/10 0700) BP: (151-169)/(76-120) 151/120 (08/10 0700) SpO2:  [99 %-100 %] 100 % (08/10 0700) Weight:  [77 kg-80.7 kg] 80.7 kg (08/10 0243) Last BM Date :  (PTA)  General:  Pleasant male in NAD Psych:  Cooperative. Normal mood and affect Eyes: Pupils equal Ears:  Normal auditory acuity Nose: No deformity, discharge or lesions Neck:  Supple, no masses felt Lungs:  Clear to auscultation.  Heart:  Regular rate, regular rhythm.  Abdomen:  Soft, nondistended, nontender, active bowel sounds, no masses felt. Large area of bruising to right flank Rectal :  Deferred Msk: Symmetrical without gross deformities.  Neurologic:  Alert, oriented, grossly normal neurologically Extremities : pitting edema distal BLE Skin:  Intact without significant lesions.    Intake/Output from previous day: 08/09 0701 - 08/10 0700 In: 240 [P.O.:240] Out: -  Intake/Output this shift:  No intake/output data recorded.  Principal Problem:   CAP (community acquired pneumonia) Active Problems:   Aortic atherosclerosis (HCC)   Bruising of the right flank   Peripheral vascular disease (HCC)   Vertebral osteomyelitis continue doxycycline until 10/24/2022 [facility])   Paroxysmal atrial fibrillation (HCC)   Chronic diastolic CHF (congestive heart failure) (HCC)   History of CAD (coronary artery disease)   CKD (chronic kidney disease), stage II    Willette Cluster, NP-C @  10/12/2022, 10:06 AM

## 2022-10-12 NOTE — ED Notes (Signed)
ED TO INPATIENT HANDOFF REPORT  ED Nurse Name and Phone #: Scheryl Marten RN, [phone]  S Name/Age/Gender Brett Riley 87 y.o. male Room/Bed: 045C/045C  Code Status   Code Status: Full Code  Home/SNF/Other Skilled nursing facility Patient oriented to: self, place, time, and situation Is this baseline? Yes   Triage Complete: Triage complete  Chief Complaint CAP (community acquired pneumonia) [J18.9]  Triage Note Pt bib [facility] from [facility] with bruising/hematoma to right flank pt is on blood thinners. Denies any injury. Pt is alert and oriented x 4. Denies any pain.    Allergies No Known Allergies  Level of Care/Admitting Diagnosis ED Disposition     ED Disposition  Admit   Condition  --   Comment  Hospital Area: [facility] [[id number]]  Level of Care: Telemetry Cardiac [103]  May admit patient to [facility] or [facility] if equivalent level of care is available:: No  Covid Evaluation: Symptomatic Person Under Investigation (PUI) or recent exposure (last 10 days) *Testing Required*  Diagnosis: CAP (community acquired pneumonia) [454098]  Admitting Physician: Tereasa Coop [[id number]]  Attending Physician: Tereasa Coop [[id number]]  Certification:: I certify this patient will need inpatient services for at least 2 midnights  Estimated Length of Stay: 5          B Medical/Surgery History Past Medical History:  Diagnosis Date   Cancer (HCC)    skin cancer basal   Carotid arterial disease (HCC)    a. 02/2016 L CEA; b. 01/2017 U/S: patent LICA, 1-39% RICA.   Celiac artery stenosis (HCC)    Coronary artery disease    a. 01/2016 MV: mild apical/basal inferior, apical lateral, mid anterolateral, and mid inferolateral ischemia. EF 57%; b. 02/2016 Cath: LM 40ost, LAD 70p/m, 20d, D1 95 (small), LCX nl, RCA 39m, RPDA 90 (small), EF 55-65%-->med Rx. Rec CABG for recurrent symptoms.   Hearing loss    History of echocardiogram    a. 02/2016 Echo: EF  50-55%, no rwma, mild MR.   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   Osteoarthritis, hip, bilateral    Left > Right   Past Surgical History:  Procedure Laterality Date   AORTIC INTERVENTION N/A 07/24/2022   Procedure: AORTIC INTERVENTION;  Surgeon: Renford Dills, MD;  Location: [facility] INVASIVE CV LAB;  Service: Cardiovascular;  Laterality: N/A;   CARDIAC CATHETERIZATION N/A 01/19/2016   Procedure: Left Heart Cath and Coronary Angiography;  Surgeon: Iran Ouch, MD;  Location: [facility] INVASIVE CV LAB;  Service: Cardiovascular;  Laterality: N/A;   COLONOSCOPY     CYSTOSCOPY/URETEROSCOPY/HOLMIUM LASER/STENT PLACEMENT Right 04/13/2021   Procedure: CYSTOSCOPY/URETEROSCOPY/HOLMIUM LASER/STENT PLACEMENT;  Surgeon: Sondra Come, MD;  Location: [facility] ORS;  Service: Urology;  Laterality: Right;   ENDARTERECTOMY Left 02/15/2016   Procedure: ENDARTERECTOMY CAROTID;  Surgeon: Annice Needy, MD;  Location: [facility] ORS;  Service: Vascular;  Laterality: Left;   RIGHT HEART CATH N/A 09/19/2022   Procedure: RIGHT HEART CATH;  Surgeon: Yvonne Kendall, MD;  Location: [facility] INVASIVE CV LAB;  Service: Cardiovascular;  Laterality: N/A;   TEE WITHOUT CARDIOVERSION N/A 08/05/2022   Procedure: TRANSESOPHAGEAL ECHOCARDIOGRAM;  Surgeon: Debbe Odea, MD;  Location: [facility] ORS;  Service: Cardiovascular;  Laterality: N/A;     A IV Location/Drains/Wounds Patient Lines/Drains/Airways Status     Active Line/Drains/Airways     Name Placement date Placement time Site Days  Peripheral IV 10/11/22 20 G Right Antecubital 10/11/22  --  Antecubital  1   Wound / Incision (Open or Dehisced) 09/14/22 Scrotum 09/14/22  1000  Scrotum  28            Intake/Output Last 24 hours No intake or output data in the 24 hours ending 10/12/22 0202  Labs/Imaging Results for orders placed or performed during the hospital encounter of 10/11/22 (from  the past 48 hour(s))  CBC with Differential     Status: Abnormal   Collection Time: 10/11/22  7:39 PM  Result Value Ref Range   WBC 7.2 4.0 - 10.5 K/uL   RBC 2.95 (L) 4.22 - 5.81 MIL/uL   Hemoglobin 8.7 (L) 13.0 - 17.0 g/dL   HCT 69.6 (L) 29.5 - 28.4 %   MCV 94.2 80.0 - 100.0 fL   MCH 29.5 26.0 - 34.0 pg   MCHC 31.3 30.0 - 36.0 g/dL   RDW 13.2 (H) 44.0 - 10.2 %   Platelets 135 (L) 150 - 400 K/uL   nRBC 0.0 0.0 - 0.2 %   Neutrophils Relative % 87 %   Neutro Abs 6.2 1.7 - 7.7 K/uL   Lymphocytes Relative 10 %   Lymphs Abs 0.7 0.7 - 4.0 K/uL   Monocytes Relative 2 %   Monocytes Absolute 0.1 0.1 - 1.0 K/uL   Eosinophils Relative 0 %   Eosinophils Absolute 0.0 0.0 - 0.5 K/uL   Basophils Relative 0 %   Basophils Absolute 0.0 0.0 - 0.1 K/uL   Immature Granulocytes 1 %   Abs Immature Granulocytes 0.10 (H) 0.00 - 0.07 K/uL    Comment: Performed at [facility], [address], [place], [place] [address]  Comprehensive metabolic panel     Status: Abnormal   Collection Time: 10/11/22  7:39 PM  Result Value Ref Range   Sodium 137 135 - 145 mmol/L   Potassium 4.2 3.5 - 5.1 mmol/L   Chloride 101 98 - 111 mmol/L   CO2 22 22 - 32 mmol/L   Glucose, Bld 125 (H) 70 - 99 mg/dL    Comment: Glucose reference range applies only to samples taken after fasting for at least 8 hours.   BUN 32 (H) 8 - 23 mg/dL   Creatinine, Ser 6.44 (H) 0.61 - 1.24 mg/dL   Calcium 8.5 (L) 8.9 - 10.3 mg/dL   Total Protein 5.8 (L) 6.5 - 8.1 g/dL   Albumin 2.7 (L) 3.5 - 5.0 g/dL   AST 30 15 - 41 U/L   ALT 43 0 - 44 U/L   Alkaline Phosphatase 72 38 - 126 U/L   Total Bilirubin 0.2 (L) 0.3 - 1.2 mg/dL   GFR, Estimated 42 (L) >60 mL/min    Comment: (NOTE) Calculated using the CKD-EPI Creatinine Equation (2021)    Anion gap 14 5 - 15    Comment: Performed at [facility], [address] [place], [place] [address]  Brain natriuretic peptide     Status: Abnormal   Collection Time: 10/11/22  7:39 PM  Result  Value Ref Range   B Natriuretic Peptide 331.8 (H) 0.0 - 100.0 pg/mL    Comment: Performed at [facility], [address] [place], [place] [address]  Troponin I (High Sensitivity)     Status: Abnormal   Collection Time: 10/11/22  7:39 PM  Result Value Ref Range   Troponin I (High Sensitivity) 28 (H) <18 ng/L    Comment: (NOTE) Elevated  high sensitivity troponin I (hsTnI) values and significant  changes across serial measurements may suggest ACS but many other  chronic and acute conditions are known to elevate hsTnI results.  Refer to the "Links" section for chest pain algorithms and additional  guidance. Performed at [facility], [address] [place], [place] [address]   CK     Status: Abnormal   Collection Time: 10/11/22  7:39 PM  Result Value Ref Range   Total CK 31 (L) 49 - 397 U/L    Comment: Performed at [facility], [address], [place], [place] [address]  Troponin I (High Sensitivity)     Status: Abnormal   Collection Time: 10/11/22  9:40 PM  Result Value Ref Range   Troponin I (High Sensitivity) 27 (H) <18 ng/L    Comment: (NOTE) Elevated high sensitivity troponin I (hsTnI) values and significant  changes across serial measurements may suggest ACS but many other  chronic and acute conditions are known to elevate hsTnI results.  Refer to the "Links" section for chest pain algorithms and additional  guidance. Performed at [facility], [address], [place], [place] [address]   Lactic acid, plasma     Status: None   Collection Time: 10/11/22 11:07 PM  Result Value Ref Range   Lactic Acid, Venous 1.9 0.5 - 1.9 mmol/L    Comment: Performed at [facility], [address], [place], [place] [address]  Urinalysis, Routine w reflex microscopic -Urine, Clean Catch     Status: Abnormal   Collection Time: 10/12/22 12:10 AM  Result Value Ref Range   Color, Urine YELLOW YELLOW   APPearance CLEAR CLEAR   Specific Gravity, Urine 1.020  1.005 - 1.030   pH 7.0 5.0 - 8.0   Glucose, UA NEGATIVE NEGATIVE mg/dL   Hgb urine dipstick MODERATE (A) NEGATIVE   Bilirubin Urine NEGATIVE NEGATIVE   Ketones, ur NEGATIVE NEGATIVE mg/dL   Protein, ur 30 (A) NEGATIVE mg/dL   Nitrite NEGATIVE NEGATIVE   Leukocytes,Ua NEGATIVE NEGATIVE   RBC / HPF 21-50 0 - 5 RBC/hpf   WBC, UA 0-5 0 - 5 WBC/hpf   Bacteria, UA RARE (A) NONE SEEN   Squamous Epithelial / HPF 6-10 0 - 5 /HPF   Mucus PRESENT     Comment: Performed at [facility], [address] [place], [place] [address]   CT CHEST ABDOMEN PELVIS W CONTRAST  Result Date: 10/11/2022 CLINICAL DATA:  Sepsis, right flank swelling. EXAM: CT CHEST, ABDOMEN, AND PELVIS WITH CONTRAST TECHNIQUE: Multidetector CT imaging of the chest, abdomen and pelvis was performed following the standard protocol during bolus administration of intravenous contrast. RADIATION DOSE REDUCTION: This exam was performed according to the departmental dose-optimization program which includes automated exposure control, adjustment of the mA and/or kV according to patient size and/or use of iterative reconstruction technique. CONTRAST:  65mL OMNIPAQUE IOHEXOL 350 MG/ML SOLN COMPARISON:  CT abdomen and pelvis 10/11/2022. CT chest 09/05/2022. Lumbar spine MRI 08/02/2022. FINDINGS: CT CHEST FINDINGS Cardiovascular: The heart is mildly enlarged. There is no pericardial effusion. 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 Overall measurement at this level is 4.8 by 3.6 cm. This appears unchanged from prior. There are atherosclerotic calcifications of the aorta and coronary arteries. Mediastinum/Nodes: No enlarged mediastinal, hilar, or axillary lymph nodes. Thyroid gland, trachea, and esophagus demonstrate no significant findings. Lungs/Pleura: There are trace bilateral pleural effusions which  have decreased from prior. There is new airspace consolidation in the right lower lobe.  Additionally, patchy ground-glass opacities in the right lower lobe have increased. Ground-glass opacities in the lingula and left lower lobe have also slightly increased. Previously identified ground-glass and airspace opacities in the upper lobes have significantly decreased. Bronchiectasis persists in the left lower lobe. There is no pneumothorax. Trachea and central airways are patent. Musculoskeletal: No chest wall mass or suspicious bone lesions identified. CT ABDOMEN PELVIS FINDINGS Hepatobiliary: No focal liver abnormality is seen. No gallstones, gallbladder wall thickening, or biliary dilatation. Pancreas: Unremarkable. No pancreatic ductal dilatation or surrounding inflammatory changes. Spleen: Normal in size without focal abnormality. Adrenals/Urinary Tract: Adrenal glands are unremarkable. Kidneys are normal, without renal calculi, focal lesion, or hydronephrosis. Bladder is unremarkable. Stomach/Bowel: There is gaseous distention of the sigmoid colon without definite twisting. There is no definite bowel obstruction. There is no focal wall thickening or inflammation. The appendix is not seen. Small bowel and stomach are within normal limits. Vascular/Lymphatic: Aorta and IVC are normal in size. Aortic/bi-iliac stent graft present. Adjacent to the anterior neck of the pancreas there is a 2.3 by 1.0 cm density which is unchanged, possibly an enlarged lymph node or exophytic lesion from the pancreas. No other enlarged lymph nodes are seen. Reproductive: Prostate is unremarkable. Other: No ascites.  Stable mild body wall edema. Musculoskeletal: The bones are osteopenic. Erosive changes are again seen along the endplates at L1-L2 corresponding to patient's osteomyelitis/discitis seen on prior MRI. IMPRESSION: 1. New airspace disease in the right lower lobe concerning for pneumonia. 2. Additional ground-glass opacities in the right lower lobe, lingula and left lower lobe have increased. Findings may be  infectious/inflammatory. 3. Trace bilateral pleural effusions have decreased from prior. 4. Gaseous distention of the sigmoid colon without definite evidence for volvulus or obstruction. 5. Mild body wall edema. 6. Stable focal aneurysm versus dissection of the proximal aortic arch measuring up to 4.8 cm Recommend semi-annual imaging followup by CTA or MRA and referral to cardiothoracic surgery if not already obtained. This recommendation follows 2010 ACCF/AHA/AATS/ACR/ASA/SCA/SCAI/SIR/STS/SVM Guidelines for the Diagnosis and Management of Patients With Thoracic Aortic Disease. Circulation. 2010; 121: U132-G40. Aortic aneurysm NOS (ICD10-I71.9) . 7. Stable density adjacent to the anterior neck of the pancreas, possibly an enlarged lymph node or exophytic lesion from the pancreas. 8. Stable erosive changes along the endplates at L1-L2 corresponding to patient's osteomyelitis/discitis seen on prior MRI. 9. Aortic atherosclerosis. Aortic Atherosclerosis (ICD10-I70.0). Electronically Signed   By: Darliss Cheney M.D.   On: 10/11/2022 22:45   CT Renal Stone Study  Result Date: 10/11/2022 CLINICAL DATA:  Abdominal pain, right flank pain EXAM: CT ABDOMEN AND PELVIS WITHOUT CONTRAST TECHNIQUE: Multidetector CT imaging of the abdomen and pelvis was performed following the standard protocol without IV contrast. RADIATION DOSE REDUCTION: This exam was performed according to the departmental dose-optimization program which includes automated exposure control, adjustment of the mA and/or kV according to patient size and/or use of iterative reconstruction technique. COMPARISON:  07/20/2022 FINDINGS: Lower chest: Bronchiectasis is seen in the lower lung fields. Patchy infiltrates are seen in the posterior lower lung fields, more so on the right side. Small right pleural effusion is seen. Minimal left pleural effusion is seen. Coronary artery calcifications are seen. Hepatobiliary: There is no dilation of bile ducts. There is 8 mm  low-density in the liver, possibly cyst or hemangioma. There is subtle increased density in the dependent portion of gallbladder lumen suggesting sludge or tiny stones.  There is no wall thickening in gallbladder. Pancreas: No focal abnormalities are seen. Spleen: Unremarkable. Adrenals/Urinary Tract: Adrenals are unremarkable. There is no hydronephrosis. Vascular calcifications are seen in right renal artery branch in the upper pole. There are no definite demonstrable renal or ureteral stones. Urinary bladder is unremarkable. Stomach/Bowel: Stomach is not distended. Small bowel loops are not dilated. Appendix is not seen. There is gaseous distention of transverse colon. Moderate amount of stool is seen in right colon and rectosigmoid. Vascular/Lymphatic: Arterial calcifications are seen in aorta and its major branches. There is interval placement of aorto bi iliac stent. There is no retroperitoneal hematoma. Reproductive: Unremarkable. Other: There is no ascites or pneumoperitoneum. There is subcutaneous edema, possibly suggesting anasarca. Small bilateral inguinal hernias containing fat are seen. Musculoskeletal: Degenerative changes are noted in lumbar spine with encroachment of neural foramina at multiple levels. There is cortical irregularity in the endplates adjacent to L1-L2 disc space which was not evident in the previous CT. In previous MR lumbar spine done on 08/02/2022, there was evidence of discitis osteomyelitis at L1-L2 level. The endplates at L1-L2 level appear more irregular in comparison with the MRI. IMPRESSION: There is no evidence of intestinal obstruction or pneumoperitoneum. There is no hydronephrosis. There is gaseous distention of transverse colon suggesting ileus. There is cortical irregularity in the endplates at L1-L2 level suggesting discitis osteomyelitis. Irregularity in the bony endplates at L1-L2 level appear more prominent in comparison with MRI done on 08/02/2022. There is no  demonstrable paraspinal fluid collection. There are patchy infiltrates in both lower lung fields, more so on the right side suggesting atelectasis/pneumonia. Small bilateral pleural effusions, more so on the right side. Bronchiectasis is seen in the lower lung fields. Aortic arteriosclerosis. Coronary artery disease. Aorto bi iliac stent is noted in place. There is subtle increased density in the dependent portion of gallbladder lumen suggesting possible sludge or tiny stones. There is no wall thickening or pericholecystic fluid collection. Electronically Signed   By: Ernie Avena M.D.   On: 10/11/2022 20:17    Pending Labs Unresulted Labs (From admission, onward)     Start     Ordered   10/11/22 2307  Blood culture (routine x 2)  BLOOD CULTURE X 2,   R      10/11/22 2306   Signed and Held  Respiratory (~20 pathogens) panel by PCR  (Respiratory panel by PCR (~20 pathogens, ~24 hr TAT)  w precautions)  Once,   R       Question Answer Comment  Patient immune status Normal   Release to patient Immediate      Signed and Held   Signed and Held  Hemoglobin and hematocrit, blood  2 times daily,   R      Signed and Held   Signed and Held  Expectorated Sputum Assessment w Gram Stain, Rflx to [employer]  Once,   R       Question Answer Comment  Patient immune status Normal   Release to patient Immediate      Signed and Held   Signed and Held  Strep pneumoniae urinary antigen  Once,   R        Signed and Held   Signed and Held  Legionella Pneumophila Serogp 1 Ur Ag  Once,   R        Signed and Held            Vitals/Pain Today's Vitals   10/11/22 1852 10/11/22 1852 10/11/22 2244  10/12/22 0119  BP:  (!) 157/76 (!) 169/85 (!) 151/79  Pulse:  73 67 70  Resp:  16 16   Temp:  98.2 F (36.8 C) 98 F (36.7 C)   TempSrc:   Oral   SpO2:  100% 99% 100%  Weight: 77 kg     Height: 6' (1.829 m)     PainSc: 0-No pain       Isolation Precautions No active  isolations  Medications Medications  vancomycin (VANCOREADY) IVPB 1500 mg/300 mL (1,500 mg Intravenous New Bag/Given 10/12/22 0101)  iohexol (OMNIPAQUE) 350 MG/ML injection 65 mL (65 mLs Intravenous Contrast Given 10/11/22 2220)  ceFEPIme (MAXIPIME) 2 g in sodium chloride 0.9 % 100 mL IVPB (0 g Intravenous Stopped 10/12/22 0025)    Mobility walks with device     Focused Assessments Pulmonary Assessment Handoff:  Lung sounds: Bilateral Breath Sounds: Clear, Diminished O2 Device: Nasal Cannula O2 Flow Rate (L/min): 3 L/min    R Recommendations: See Admitting Provider Note  Report given to:   Additional Notes: Recent hospital admission, was at SNF/rehab recovering. Pt endorsed right-sided flank pain. Found significant bruising to the right flank, but patient denies known injury. A&O x 4 baseline. + thinners. Also, found to have a new pneumonia, likely some variety of HCAP. Potentially chronically elevated trops/BNPs, but no other significant indicators of infection. VSS and afebrile. Vanc and cefepime coverage for PNA

## 2022-10-12 NOTE — H&P (Addendum)
History and Physical    Brett Riley:[id number] DOB: January 12, 1936 DOA: 10/11/2022  PCP: Dale Florissant, MD   Patient coming from: SNF- [facility]    Chief Complaint:  Chief Complaint  Patient presents with   Flank Pain    HPI:  Brett Riley is a 87 y.o. 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  Patient also endorsing shortness of breath which is ongoing since last hospital admission.  Per chart review recent hospital admission 6/16 to 7/24 due to acute hypoxic respiratory failure treated with tapered prednisone with continue Bactrim 3 times weekly as long as patient is on steroid.  Patient underwent heart cath which showed normal filling pressure..  Patient also found to have AKI on CKD stage 3A managing conservatively.  Also table bilateral pleural effusion unable to perform paracentesis by IR and resolved by itself.  Patient also found to have MSSA bacteremia with vertebral osteomyelitis initially treated with IV cefepime which was transitioned to doxycycline 100 mg twice daily to complete until 10/24/2022.  For management of chronic atrial fibrillation patient continued on Eliquis, beta-blocker.  During the hospital admission patient also developed acute on chronic hypoxic respiratory failure evaluated by pulmonology and ID.  CTA chest showed diffuse pulmonary infiltrate concern for systemic disease process and pulm neurology recommended treat with dapper steroid and Bactrim for PCP prophylaxis, also recommended systemic vasculitis workup.   ED Course:  At presentation to ED patient is hemodynamically stable heart rate 73,  respiratory 16, blood pressure 157/76 and O2 sat 100% on 3 L oxygen. CBC unremarkable WBC 7.2, RBC 2.93, hemoglobin 8.7, hematocrit 27, and platelet count 135. CMP showed sodium 137, potassium 4.2, chloride 101, bicarb 22, blood glucose 125, BUN 32, creatinine 1.62 (improved since last hospital admission 1 month ago), calcium 8.5, GFR 42 and anion gap 14. Elevated BNP 331 which is around baseline. Troponin 28 and 27.  EKG normal sinus rhythm heart rate 65.  No STEMI T wave abnormality. Lactic acid 1.9 WNL.  In the ED blood culture has been obtained.  Due to right-sided flank hemorrhage CT stone study obtained did not showed any interstitial obstruction or pneumoperitoneum.  No evidence of hydronephrosis.  Gaseous distention of the transverse colon suggesting ileus There is cortical irregularity in the endplates at L1-L2 level suggesting discitis osteomyelitis. Irregularity in the bony endplates at L1-L2 level appear more prominent in comparison with MRI done on 08/02/2022. There is no demonstrable paraspinal fluid collection.  There are patchy infiltrates in both lower lung fields, more so on the right side suggesting atelectasis/pneumonia. Small bilateral pleural effusions, more so on the right side. Bronchiectasis is seen in the lower lung fields. Aortic arteriosclerosis. Coronary artery disease. Aorto bi iliac stent is noted in place. No evidence of retroperitoneal hematoma.  A CTA chest abdomen pelvis showed concerning for right lower lobe pneumonia.  There is bilateral pleural effusion.  Gaseous distention of sigmoid colon without any evidence of volvulus or obstruction.  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  Stable density to the anterior neck of the pancreas possibly an enlarged lymph node from the pancreas.  Stable erosion of L1-L2 endplate corresponding osteomyelitis compared to prior MI.  Aortic atherosclerosis.  Discussed case  with general surgery Dr. Derrell Lolling given patient  does not have any abdominal pain, nausea vomiting recommended make sure patient has bowel movement and no need for NG tube placement as it is in the transverse colon and it will improve eventually.    In the ED patient has been given IV cefepime and 1 dose of vancomycin with the concern for bilateral lobe pneumonia.  Hospitalist has been consulted for management of pneumonia.  Review of Systems:  Review of Systems  Constitutional:  Negative for chills, fever, malaise/fatigue and weight loss.  HENT:  Negative for hearing loss and tinnitus.   Eyes:  Negative for blurred vision.  Respiratory:  Negative for cough, hemoptysis, sputum production, shortness of breath and wheezing.   Cardiovascular:  Negative for chest pain, palpitations and orthopnea.  Gastrointestinal:  Negative for abdominal pain, constipation, diarrhea, heartburn, nausea and vomiting.  Genitourinary:  Negative for dysuria, frequency and urgency.  Musculoskeletal:  Negative for back pain, falls, joint pain, myalgias and neck pain.  Skin:  Negative for itching and rash.  Neurological:  Negative for dizziness and headaches.  Endo/Heme/Allergies:  Negative for environmental allergies and polydipsia. Bruises/bleeds easily.  Psychiatric/Behavioral:  The patient is not nervous/anxious.     Past Medical History:  Diagnosis Date   Cancer [facility])    skin cancer basal   Carotid arterial disease (HCC)    a. 02/2016 L CEA; b. 01/2017 U/S: patent LICA, 1-39% RICA.   Celiac artery stenosis (HCC)    Coronary artery disease    a. 01/2016 MV: mild apical/basal inferior, apical lateral, mid anterolateral, and mid inferolateral ischemia. EF 57%; b. 02/2016 Cath: LM 40ost, LAD 70p/m, 20d, D1 95 (small), LCX nl, RCA 68m, RPDA 90 (small), EF 55-65%-->med Rx. Rec CABG for recurrent symptoms.   Hearing loss    History of echocardiogram    a. 02/2016 Echo: EF 50-55%, no rwma, mild MR.   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   Osteoarthritis, hip, bilateral    Left > Right    Past Surgical History:  Procedure Laterality Date   AORTIC INTERVENTION N/A 07/24/2022   Procedure: AORTIC INTERVENTION;  Surgeon: Renford Dills, MD;  Location: [facility] INVASIVE CV LAB;  Service: Cardiovascular;  Laterality: N/A;   CARDIAC CATHETERIZATION N/A 01/19/2016   Procedure: Left Heart Cath and Coronary Angiography;  Surgeon: Iran Ouch, MD;  Location: [facility] INVASIVE CV LAB;  Service: Cardiovascular;  Laterality: N/A;   COLONOSCOPY     CYSTOSCOPY/URETEROSCOPY/HOLMIUM LASER/STENT PLACEMENT Right 04/13/2021   Procedure: CYSTOSCOPY/URETEROSCOPY/HOLMIUM LASER/STENT PLACEMENT;  Surgeon: Sondra Come, MD;  Location: [facility] ORS;  Service: Urology;  Laterality: Right;   ENDARTERECTOMY Left 02/15/2016   Procedure: ENDARTERECTOMY CAROTID;  Surgeon: Annice Needy, MD;  Location: [facility] ORS;  Service: Vascular;  Laterality: Left;   RIGHT HEART CATH N/A 09/19/2022   Procedure: RIGHT HEART CATH;  Surgeon: Yvonne Kendall, MD;  Location: [facility] INVASIVE CV LAB;  Service: Cardiovascular;  Laterality: N/A;   TEE WITHOUT CARDIOVERSION N/A 08/05/2022   Procedure: TRANSESOPHAGEAL ECHOCARDIOGRAM;  Surgeon: Debbe Odea, MD;  Location: [facility] ORS;  Service: Cardiovascular;  Laterality: N/A;     reports that he has never smoked. He has never used smokeless tobacco. He reports that he does not drink alcohol and does not use drugs.  No Known Allergies  Family History  Problem Relation Age of Onset   Stroke Mother  Heart disease Father        MI   Heart attack Father    Breast cancer Sister    Colon cancer Neg Hx    Prostate cancer Neg Hx     Prior to Admission medications   Medication Sig Start Date End Date Taking? Authorizing Provider  acidophilus (RISAQUAD) CAPS capsule Take 1 capsule by mouth daily. 09/26/22  Yes Marcelino Duster, MD  apixaban (ELIQUIS) 2.5 MG TABS  tablet Take 1 tablet (2.5 mg total) by mouth 2 (two) times daily. 09/25/22  Yes Marcelino Duster, MD  clopidogrel (PLAVIX) 75 MG tablet TAKE 1 TABLET BY MOUTH EVERY DAY WITH BREAKFAST 08/29/22  Yes Dale [place], MD  diltiazem (CARDIZEM CD) 360 MG 24 hr capsule Take 1 capsule (360 mg total) by mouth daily. 08/09/22  Yes Delfino Lovett, MD  doxycycline (VIBRAMYCIN) 50 MG capsule Take 2 capsules (100 mg total) by mouth 2 (two) times daily. Patient taking differently: Take 100 mg by mouth 2 (two) times daily. For 6 weeks 09/25/22 10/24/22 Yes Marcelino Duster, MD  feeding supplement (ENSURE ENLIVE / ENSURE PLUS) LIQD Take 237 mLs by mouth 2 (two) times daily between meals. Patient taking differently: Take 237 mLs by mouth 3 (three) times daily after meals. 08/09/22  Yes Delfino Lovett, MD  hydrALAZINE (APRESOLINE) 25 MG tablet Take 25 mg by mouth every 6 (six) hours as needed (SBP >160).   Yes [provider]  melatonin 5 MG TABS Take 0.5 tablets (2.5 mg total) by mouth at bedtime as needed. 09/25/22  Yes Marcelino Duster, MD  metoprolol succinate (TOPROL-XL) 100 MG 24 hr tablet TAKE 1 TABLET BY MOUTH EVERY DAY WITH A MEAL 08/29/22  Yes Dale [place], MD  Multiple Vitamins-Iron (MULTIVITAMINS WITH IRON) TABS Take 1 tablet by mouth daily.   Yes [provider]  naloxone (NARCAN) nasal spray 4 mg/0.1 mL Place 4 mg into the nose 3 (three) times daily as needed (opioid overdose).   Yes [provider]  pantoprazole (PROTONIX) 40 MG tablet Take 1 tablet (40 mg total) by mouth daily. 08/09/22  Yes Delfino Lovett, MD  polyethylene glycol (MIRALAX / GLYCOLAX) 17 g packet Take 17 g by mouth daily.   Yes [provider]  predniSONE (DELTASONE) 5 MG tablet Take 10 tablets (50 mg total) by mouth daily with breakfast for 5 days, THEN 9 tablets (45 mg total) daily with breakfast for 7 days, THEN 8 tablets (40 mg total) daily with breakfast for 7 days, THEN 7 tablets (35 mg total) daily  with breakfast for 7 days, THEN 6 tablets (30 mg total) daily with breakfast for 7 days, THEN 5 tablets (25 mg total) daily with breakfast for 7 days, THEN 4 tablets (20 mg total) daily with breakfast for 7 days, THEN 3 tablets (15 mg total) daily with breakfast for 7 days, THEN 2 tablets (10 mg total) daily with breakfast for 7 days, THEN 1 tablet (5 mg total) daily with breakfast for 7 days. 09/25/22 12/02/22 Yes Marcelino Duster, MD  rosuvastatin (CRESTOR) 10 MG tablet Take 1 tablet (10 mg total) by mouth daily. 02/14/22  Yes Dale , MD  senna (SENOKOT) 8.6 MG TABS tablet Take 1 tablet by mouth daily.   Yes [provider]  sodium chloride (OCEAN) 0.65 % SOLN nasal spray Place 1 spray into both nostrils as needed for congestion. 08/09/22  Yes Delfino Lovett, MD  sulfamethoxazole-trimethoprim (BACTRIM DS) 800-160 MG tablet Take 1 tablet by mouth 3 (three) times a  week. Patient taking differently: Take 1 tablet by mouth every Monday, Wednesday, and Friday. 09/25/22  Yes Marcelino Duster, MD  traMADol (ULTRAM) 50 MG tablet Take 50 mg by mouth every 8 (eight) hours as needed for moderate pain.   Yes [provider]  traMADol HCl 100 MG TABS Take 100 mg by mouth every 12 (twelve) hours as needed (severe pain).   Yes [provider]  vitamin B-12 (CYANOCOBALAMIN) 1000 MCG tablet Take 1,000 mcg by mouth daily.   Yes [provider]  amLODipine (NORVASC) 5 MG tablet TAKE 1 TABLET (5 MG TOTAL) BY MOUTH DAILY. Patient not taking: Reported on 10/12/2022 10/04/22   Dale [place], MD     Physical Exam: Vitals:   10/11/22 2244 10/12/22 0119 10/12/22 0243 10/12/22 0400  BP: (!) 169/85 (!) 151/79 (!) 160/82 (!) 154/79  Pulse: 67 70 73 63  Resp: 16  20 16   Temp: 98 F (36.7 C)  98.2 F (36.8 C)   TempSrc: Oral  Oral   SpO2: 99% 100% 100% 100%  Weight:   80.7 kg   Height:   6' (1.829 m)     Physical Exam Constitutional:      General: He is not in acute  distress.    Appearance: He is not ill-appearing.  HENT:     Head: Normocephalic.     Nose: Nose normal.  Eyes:     Pupils: Pupils are equal, round, and reactive to light.  Cardiovascular:     Rate and Rhythm: Regular rhythm.     Pulses: Normal pulses.     Heart sounds: No murmur heard. Pulmonary:     Effort: Pulmonary effort is normal.     Breath sounds: Normal breath sounds.  Abdominal:     General: Bowel sounds are normal. There is no distension.     Palpations: There is no mass.     Tenderness: There is no abdominal tenderness. There is no right CVA tenderness or left CVA tenderness.  Musculoskeletal:        General: Normal range of motion.     Cervical back: Neck supple.     Right lower leg: No edema.     Left lower leg: No edema.  Skin:    General: Skin is warm.  Neurological:     Mental Status: He is alert and oriented to person, place, and time.  Psychiatric:        Mood and Affect: Mood normal.        Thought Content: Thought content normal.      Media Information   Document Information  Photos    10/12/2022 01:28  Attached To:  Hospital Encounter on 10/11/22  Source Information  Tereasa Coop, MD  [facility]  Document History     Labs on Admission: I have personally reviewed following labs and imaging studies  CBC: Recent Labs  Lab 10/11/22 1939  WBC 7.2  NEUTROABS 6.2  HGB 8.7*  HCT 27.8*  MCV 94.2  PLT 135*   Basic Metabolic Panel: Recent Labs  Lab 10/11/22 1939  NA 137  K 4.2  CL 101  CO2 22  GLUCOSE 125*  BUN 32*  CREATININE 1.62*  CALCIUM 8.5*   GFR: Estimated Creatinine Clearance: 37.3 mL/min (A) (by C-G formula based on SCr of 1.62 mg/dL (H)). Liver Function Tests: Recent Labs  Lab 10/11/22 1939  AST 30  ALT 43  ALKPHOS 72  BILITOT 0.2*  PROT 5.8*  ALBUMIN 2.7*   No  results for input(s): "LIPASE", "AMYLASE" in the last 168 hours. No results for input(s): "AMMONIA" in the last 168 hours. Coagulation  Profile: No results for input(s): "INR", "PROTIME" in the last 168 hours. Cardiac Enzymes: Recent Labs  Lab 10/11/22 1939 10/11/22 2140  CKTOTAL 31*  --   TROPONINIHS 28* 27*   BNP (last 3 results) Recent Labs    08/21/22 1655 09/19/22 0629 10/11/22 1939  BNP 673.3* 264.2* 331.8*   HbA1C: No results for input(s): "HGBA1C" in the last 72 hours. CBG: No results for input(s): "GLUCAP" in the last 168 hours. Lipid Profile: No results for input(s): "CHOL", "HDL", "LDLCALC", "TRIG", "CHOLHDL", "LDLDIRECT" in the last 72 hours. Thyroid Function Tests: No results for input(s): "TSH", "T4TOTAL", "FREET4", "T3FREE", "THYROIDAB" in the last 72 hours. Anemia Panel: No results for input(s): "VITAMINB12", "FOLATE", "FERRITIN", "TIBC", "IRON", "RETICCTPCT" in the last 72 hours. Urine analysis:    Component Value Date/Time   COLORURINE YELLOW 10/12/2022 0010   APPEARANCEUR CLEAR 10/12/2022 0010   LABSPEC 1.020 10/12/2022 0010   PHURINE 7.0 10/12/2022 0010   GLUCOSEU NEGATIVE 10/12/2022 0010   HGBUR MODERATE (A) 10/12/2022 0010   BILIRUBINUR NEGATIVE 10/12/2022 0010   KETONESUR NEGATIVE 10/12/2022 0010   PROTEINUR 30 (A) 10/12/2022 0010   NITRITE NEGATIVE 10/12/2022 0010   LEUKOCYTESUR NEGATIVE 10/12/2022 0010    Radiological Exams on Admission: I have personally reviewed images CT CHEST ABDOMEN PELVIS W CONTRAST  Result Date: 10/11/2022 CLINICAL DATA:  Sepsis, right flank swelling. EXAM: CT CHEST, ABDOMEN, AND PELVIS WITH CONTRAST TECHNIQUE: Multidetector CT imaging of the chest, abdomen and pelvis was performed following the standard protocol during bolus administration of intravenous contrast. RADIATION DOSE REDUCTION: This exam was performed according to the departmental dose-optimization program which includes automated exposure control, adjustment of the mA and/or kV according to patient size and/or use of iterative reconstruction technique. CONTRAST:  65mL OMNIPAQUE IOHEXOL 350  MG/ML SOLN COMPARISON:  CT abdomen and pelvis 10/11/2022. CT chest 09/05/2022. Lumbar spine MRI 08/02/2022. FINDINGS: CT CHEST FINDINGS Cardiovascular: The heart is mildly enlarged. There is no pericardial effusion. 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 Overall measurement at this level is 4.8 by 3.6 cm. This appears unchanged from prior. There are atherosclerotic calcifications of the aorta and coronary arteries. Mediastinum/Nodes: No enlarged mediastinal, hilar, or axillary lymph nodes. Thyroid gland, trachea, and esophagus demonstrate no significant findings. Lungs/Pleura: There are trace bilateral pleural effusions which have decreased from prior. There is new airspace consolidation in the right lower lobe. Additionally, patchy ground-glass opacities in the right lower lobe have increased. Ground-glass opacities in the lingula and left lower lobe have also slightly increased. Previously identified ground-glass and airspace opacities in the upper lobes have significantly decreased. Bronchiectasis persists in the left lower lobe. There is no pneumothorax. Trachea and central airways are patent. Musculoskeletal: No chest wall mass or suspicious bone lesions identified. CT ABDOMEN PELVIS FINDINGS Hepatobiliary: No focal liver abnormality is seen. No gallstones, gallbladder wall thickening, or biliary dilatation. Pancreas: Unremarkable. No pancreatic ductal dilatation or surrounding inflammatory changes. Spleen: Normal in size without focal abnormality. Adrenals/Urinary Tract: Adrenal glands are unremarkable. Kidneys are normal, without renal calculi, focal lesion, or hydronephrosis. Bladder is unremarkable. Stomach/Bowel: There is gaseous distention of the sigmoid colon without definite twisting. There is no definite bowel obstruction. There is no focal wall thickening or inflammation. The appendix is not seen. Small bowel and stomach are within normal  limits. Vascular/Lymphatic: Aorta and  IVC are normal in size. Aortic/bi-iliac stent graft present. Adjacent to the anterior neck of the pancreas there is a 2.3 by 1.0 cm density which is unchanged, possibly an enlarged lymph node or exophytic lesion from the pancreas. No other enlarged lymph nodes are seen. Reproductive: Prostate is unremarkable. Other: No ascites.  Stable mild body wall edema. Musculoskeletal: The bones are osteopenic. Erosive changes are again seen along the endplates at L1-L2 corresponding to patient's osteomyelitis/discitis seen on prior MRI. IMPRESSION: 1. New airspace disease in the right lower lobe concerning for pneumonia. 2. Additional ground-glass opacities in the right lower lobe, lingula and left lower lobe have increased. Findings may be infectious/inflammatory. 3. Trace bilateral pleural effusions have decreased from prior. 4. Gaseous distention of the sigmoid colon without definite evidence for volvulus or obstruction. 5. Mild body wall edema. 6. Stable focal aneurysm versus dissection of the proximal aortic arch measuring up to 4.8 cm Recommend semi-annual imaging followup by CTA or MRA and referral to cardiothoracic surgery if not already obtained. This recommendation follows 2010 ACCF/AHA/AATS/ACR/ASA/SCA/SCAI/SIR/STS/SVM Guidelines for the Diagnosis and Management of Patients With Thoracic Aortic Disease. Circulation. 2010; 121: G644-I34. Aortic aneurysm NOS (ICD10-I71.9) . 7. Stable density adjacent to the anterior neck of the pancreas, possibly an enlarged lymph node or exophytic lesion from the pancreas. 8. Stable erosive changes along the endplates at L1-L2 corresponding to patient's osteomyelitis/discitis seen on prior MRI. 9. Aortic atherosclerosis. Aortic Atherosclerosis (ICD10-I70.0). Electronically Signed   By: Darliss Cheney M.D.   On: 10/11/2022 22:45   CT Renal Stone Study  Result Date: 10/11/2022 CLINICAL DATA:  Abdominal pain, right flank pain EXAM: CT ABDOMEN AND  PELVIS WITHOUT CONTRAST TECHNIQUE: Multidetector CT imaging of the abdomen and pelvis was performed following the standard protocol without IV contrast. RADIATION DOSE REDUCTION: This exam was performed according to the departmental dose-optimization program which includes automated exposure control, adjustment of the mA and/or kV according to patient size and/or use of iterative reconstruction technique. COMPARISON:  07/20/2022 FINDINGS: Lower chest: Bronchiectasis is seen in the lower lung fields. Patchy infiltrates are seen in the posterior lower lung fields, more so on the right side. Small right pleural effusion is seen. Minimal left pleural effusion is seen. Coronary artery calcifications are seen. Hepatobiliary: There is no dilation of bile ducts. There is 8 mm low-density in the liver, possibly cyst or hemangioma. There is subtle increased density in the dependent portion of gallbladder lumen suggesting sludge or tiny stones. There is no wall thickening in gallbladder. Pancreas: No focal abnormalities are seen. Spleen: Unremarkable. Adrenals/Urinary Tract: Adrenals are unremarkable. There is no hydronephrosis. Vascular calcifications are seen in right renal artery branch in the upper pole. There are no definite demonstrable renal or ureteral stones. Urinary bladder is unremarkable. Stomach/Bowel: Stomach is not distended. Small bowel loops are not dilated. Appendix is not seen. There is gaseous distention of transverse colon. Moderate amount of stool is seen in right colon and rectosigmoid. Vascular/Lymphatic: Arterial calcifications are seen in aorta and its major branches. There is interval placement of aorto bi iliac stent. There is no retroperitoneal hematoma. Reproductive: Unremarkable. Other: There is no ascites or pneumoperitoneum. There is subcutaneous edema, possibly suggesting anasarca. Small bilateral inguinal hernias containing fat are seen. Musculoskeletal: Degenerative changes are noted in  lumbar spine with encroachment of neural foramina at multiple levels. There is cortical irregularity in the endplates adjacent to L1-L2 disc space which was not evident in the previous CT. In previous MR lumbar spine done on 08/02/2022,  there was evidence of discitis osteomyelitis at L1-L2 level. The endplates at L1-L2 level appear more irregular in comparison with the MRI. IMPRESSION: There is no evidence of intestinal obstruction or pneumoperitoneum. There is no hydronephrosis. There is gaseous distention of transverse colon suggesting ileus. There is cortical irregularity in the endplates at L1-L2 level suggesting discitis osteomyelitis. Irregularity in the bony endplates at L1-L2 level appear more prominent in comparison with MRI done on 08/02/2022. There is no demonstrable paraspinal fluid collection. There are patchy infiltrates in both lower lung fields, more so on the right side suggesting atelectasis/pneumonia. Small bilateral pleural effusions, more so on the right side. Bronchiectasis is seen in the lower lung fields. Aortic arteriosclerosis. Coronary artery disease. Aorto bi iliac stent is noted in place. There is subtle increased density in the dependent portion of gallbladder lumen suggesting possible sludge or tiny stones. There is no wall thickening or pericholecystic fluid collection. Electronically Signed   By: Ernie Avena M.D.   On: 10/11/2022 20:17    EKG: My personal interpretation of EKG shows: Sinus rhythm heart rate 65.  No ST and T wave abnormality.  Assessment/Plan: Principal Problem:   CAP (community acquired pneumonia) Active Problems:   Bruising of the right flank   Vertebral osteomyelitis continue doxycycline until 10/24/2022 [facility])   Aortic atherosclerosis (HCC)   Peripheral vascular disease (HCC)   Paroxysmal atrial fibrillation (HCC)   Chronic diastolic CHF (congestive heart failure) (HCC)   History of CAD (coronary artery disease)   CKD (chronic kidney disease),  stage II    Assessment and Plan: Community-acquired pneumonia Bilateral lower lobe pneumonia -Patient is afebrile on presentation.  No leukocytosis. - CT chest and abdomen pelvis showed right lower lobe airspace disease concerning for pneumonia. - CT stone chaser showed bilateral lower lung field infiltrate concerning for pneumonia. -Respiratory panel negative. -In the ED patient was given vancomycin and cefepime IV once. -Per chart review patient has been recently discharged from the hospital 09/25/2022.  There is high risk of healthcare associated pneumonia.  However given 2 weeks ago patient has significantly elevated creatinine around 4.3 and as he was very close to dialysis holding vancomycin and cefepime as of now. - Continue empirically treating with ceftriaxone 1 g daily and continue doxycycline 100 mg twice daily.  (Patient is also on doxycycline for management of vertebral osteomyelitis and Bactrim 800 mg 3 times weekly as patient is on tapered dose of prednisone to complete until 11/25/2022) -Pending blood culture, sputum culture, urine Streptococcus and urine Legionella antigen. - Will follow-up with culture result for antibiotic guidance.    Right-sided flank bruising and ecchymosis -Patient mainly coming from nursing home with the concern for right-sided extensive bruise and ecchymosis.  CT abdomen pelvis and CT stone chaser did not showed any internal hematoma and retroperitoneal hematoma.  Patient denies any trauma to the right sided flank. - Stable H&H 8.7 and 27.  (Patient's baseline hemoglobin around 8-9) -Continue to monitor for development of right-sided flank pain.  Continue to monitor H&H closely given patient is on Eliquis.   Recent history of AKI on CKD stage 2 Renal function improving -Creatinine has been improved to 1.6.  Patient has been recently hospitalized 2 weeks ago during that time creatinine peaked up to 4.32 and patient was very close to dialysis.  However  renal function has been improved with IV fluid. - Baseline creatinine is around 1.5 and GFR above 60. -Continue to monitor renal function and avoid nephrotoxic agent - Monitor monitor  urine output   Vertebral osteomyelitis (continue oral doxycycline until 10/24/2022) -Patient denies any fever and chills.  No leukocytosis and patient is afebrile.  Patient has been recently hospitalized in July for the management of vertebral osteomyelitis.  ID recommended to continue oral doxycycline 100 mg twice daily until 10/24/2022.  Chronic hypoxic respiratory failure Suspecting systemic pulmonary disease/vasculitis -During last hospital admission patient has been evaluated by pulmonology due CTA chest showed bilateral pulmonary infiltrate.  Unable to find any infectious cause.  Concern for underlying systemic pulmonary disease versus vasculitis and recommended outpatient workup for that and by the meantime recommended to continue taper dose of steroid and Bactrim 3 times weekly as long as patient is on steroid. - Resumed prednisone to taper over until 11/25/2022.  Continue Bactrim 800 mg 3 times daily as long as patient is on steroid. -Continue Protonix 40 mg daily GI ulcer for prophylaxis. -Continue POC blood glucose checks 3 times daily as patient is on high-dose steroid.  Paroxysmal atrial fibrillation - CHA2DS2-VASc score = 4 -Continue Eliquis 2.5 mg twice daily. - Resumed home Cardizem 360 mg daily and Toprol-XL 100 mg daily.   Chronic diastolic heart failure with preserved EF -History of diastolic heart failure. - Currently on Cardizem and Toprol-XL for the management of PACs Mantel fibrillation -Continue to monitor blood pressure.  History of CAD History of peripheral vascular disease -Resumed home Plavix 75 mg daily and Toprol-XL 100 mg daily. -Continue Crestor 10 mg daily   Age-related frailty -Continue fall precaution -Patient need help with ambulation. -Per patient's family request  consulted inpatient PT and OT for evaluation  Code Status:  Full Code Diet: Heart healthy diet Family Communication: Discussed treatment plan both with patient and patient family at the bedside Disposition Plan: Pending blood culture sputum culture result.  Pending clinical improvement.  Plan to discharge to nursing home in next 3 to 4 days. Consults: Physical therapy and Occupational Therapy Admission status:   Inpatient, Telemetry bed  Severity of Illness: The appropriate patient status for this patient is INPATIENT. Inpatient status is judged to be reasonable and necessary in order to provide the required intensity of service to ensure the patient's safety. The patient's presenting symptoms, physical exam findings, and initial radiographic and laboratory data in the context of their chronic comorbidities is felt to place them at high risk for further clinical deterioration. Furthermore, it is not anticipated that the patient will be medically stable for discharge from the hospital within 2 midnights of admission.   * I certify that at the point of admission it is my clinical judgment that the patient will require inpatient hospital care spanning beyond 2 midnights from the point of admission due to high intensity of service, high risk for further deterioration and high frequency of surveillance required.Marland Kitchen    Tereasa Coop, MD [facility]  How to contact the [facility] Attending or Consulting provider 7A - 7P or covering provider during after hours 7P -7A, for this patient.  Check the care team in [facility] and look for a) attending/consulting [facility] provider listed and b) the [facility] team listed Log into [email or web address] and use [facility]'s universal password to access. If you do not have the password, please contact the hospital operator. Locate the [facility] provider you are looking for under [facility] and page to a number that you can be directly reached. If you still have difficulty reaching the  provider, please page the [facility] (Director on Call) for the Hospitalists listed on amion for assistance.  10/12/2022, 6:09 AM

## 2022-10-12 NOTE — Progress Notes (Signed)
Progress Note    Brett Riley  BJY:[id number] DOB: 1935-11-02  DOA: 10/11/2022 PCP: Dale Simpson, MD      Brief Narrative:    Medical records reviewed and are as summarized below:  Brett Riley is a 87 y.o. 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  Patient also endorsing shortness of breath which is ongoing since last hospital admission.   Per chart review recent hospital admission 6/16 to 7/24 due to acute hypoxic respiratory failure treated with tapered prednisone with continue Bactrim 3 times weekly as long as patient is on steroid.  Patient underwent heart cath which showed normal filling pressure..  Patient also found to have AKI on CKD stage 3A managing conservatively.  Also table bilateral pleural effusion unable to perform paracentesis by IR and resolved by itself.  Patient also found to have MSSA bacteremia with vertebral osteomyelitis initially treated with IV cefepime which was transitioned to doxycycline 100 mg twice daily to complete until 10/24/2022.  For management of chronic atrial fibrillation patient continued on Eliquis, beta-blocker.  During the hospital admission patient also developed acute on chronic hypoxic respiratory failure evaluated by pulmonology and ID.  CTA chest showed diffuse pulmonary infiltrate concern for systemic disease process and pulm neurology recommended treat with dapper steroid and Bactrim for PCP prophylaxis, also recommended systemic vasculitis workup.    No notes on file      Assessment/Plan:   Principal Problem:   CAP (community acquired pneumonia) Active Problems:    Bruising of the right flank   Vertebral osteomyelitis continue doxycycline until 10/24/2022 [facility])   Aortic atherosclerosis (HCC)   Peripheral vascular disease (HCC)   Paroxysmal atrial fibrillation (HCC)   Chronic diastolic CHF (congestive heart failure) (HCC)   History of CAD (coronary artery disease)   CKD (chronic kidney disease), stage II    Body mass index is 24.13 kg/m.   Community-acquired pneumonia Bilateral lower lobe pneumonia Continue IV ceftriaxone and oral doxycycline. (Patient is also on doxycycline for management of vertebral osteomyelitis and Bactrim 800 mg 3 times weekly as patient is on tapered dose of prednisone to complete until 11/25/2022) Follow-up blood cultures.      Right-sided flank bruising and ecchymosis No evidence of retroperitoneal hematoma on CT abdomen and pelvis.  H&H is stable.    Recent history of AKI on CKD stage 2 Renal function improving Baseline creatinine is around 1.5 and GFR above 60. Monitor BMP closely     Vertebral osteomyelitis (continue oral doxycycline until 10/24/2022) Patient has been recently hospitalized in July for the management of vertebral osteomyelitis.  ID recommended to continue oral doxycycline 100 mg twice daily until 10/24/2022.    Chronic hypoxic respiratory failure Suspecting systemic pulmonary disease/vasculitis -During last hospital admission patient has been evaluated by pulmonology due CTA chest showed bilateral pulmonary infiltrate.  Unable to find any infectious cause.  Concern for underlying systemic pulmonary disease versus vasculitis and recommended outpatient workup for that and by the meantime recommended to continue taper dose of steroid and Bactrim 3 times weekly as long as patient is on steroid. - Resumed prednisone to taper over until 11/25/2022.  Continue Bactrim 800 mg 3 times daily as long as patient is on  steroid. -Continue Protonix 40 mg daily GI ulcer for prophylaxis. Monitor glucose levels     Paroxysmal atrial fibrillation CHA2DS2-VASc score = 4 Continue Eliquis and Cardizem     Chronic diastolic heart failure with preserved EF Significant peripheral edema.  Give 1 dose of IV Lasix.    History of CAD History of peripheral vascular disease Continue Plavix Toprol and Crestor     Age-related frailty Fall precautions.  PT and OT evaluation    Diet Order             Diet Heart Room service appropriate? Yes; Fluid consistency: Thin  Diet effective now                            Consultants: Gastroenterologist  Procedures: None    Medications:    apixaban  2.5 mg Oral BID   clopidogrel  75 mg Oral Daily   diltiazem  360 mg Oral Daily   doxycycline  100 mg Oral Q12H   feeding supplement  237 mL Oral TID BM   metoprolol succinate  100 mg Oral Daily   multivitamins with iron  1 tablet Oral Daily   pantoprazole  40 mg Oral Daily   polyethylene glycol  17 g Oral Daily   predniSONE  40 mg Oral Q breakfast   Followed by   Melene Muller ON 10/14/2022] predniSONE  35 mg Oral Q breakfast   Followed by   Melene Muller ON 10/21/2022] predniSONE  30 mg Oral Q breakfast   Followed by   Melene Muller ON 10/28/2022] predniSONE  25 mg Oral Q breakfast   Followed by   Melene Muller ON 11/04/2022] predniSONE  20 mg Oral Q breakfast   Followed by   Melene Muller ON 11/11/2022] predniSONE  15 mg Oral Q breakfast   Followed by   Melene Muller ON 11/18/2022] predniSONE  10 mg Oral Q breakfast   Followed by   Melene Muller ON 11/25/2022] predniSONE  5 mg Oral Q breakfast   rosuvastatin  10 mg Oral QHS   senna  1 tablet Oral Daily   sodium chloride flush  3 mL Intravenous Q12H   [START ON 10/14/2022] sulfamethoxazole-trimethoprim  1 tablet Oral Once per day on Monday Wednesday Friday   Continuous Infusions:  sodium chloride     cefTRIAXone (ROCEPHIN)  IV       Anti-infectives (From admission, onward)    Start     Dose/Rate Route Frequency Ordered Stop   10/14/22 0900  sulfamethoxazole-trimethoprim  (BACTRIM DS) 800-160 MG per tablet 1 tablet        1 tablet Oral Once per day on Monday Wednesday Friday 10/12/22 0300     10/12/22 1000  doxycycline (VIBRA-TABS) tablet 100 mg        100 mg Oral Every 12 hours 10/12/22 0300 10/24/22 2359   10/12/22 1000  cefTRIAXone (ROCEPHIN) 1 g in sodium chloride 0.9 % 100 mL IVPB        1 g 200 mL/hr over 30 Minutes Intravenous Every 24 hours 10/12/22 0300     10/11/22 2345  ceFEPIme (MAXIPIME) 2 g in sodium chloride 0.9 % 100 mL IVPB        2 g 200 mL/hr over 30 Minutes Intravenous  Once 10/11/22 2336 10/12/22 0025   10/11/22 2330  vancomycin (VANCOREADY) IVPB 1500 mg/300 mL        1,500 mg 150 mL/hr over 120 Minutes Intravenous  Once 10/11/22 2311 10/12/22 0301   10/11/22  2315  vancomycin (VANCOCIN) IVPB 1000 mg/200 mL premix  Status:  Discontinued        1,000 mg 200 mL/hr over 60 Minutes Intravenous  Once 10/11/22 2306 10/11/22 2311              Family Communication/Anticipated D/C date and plan/Code Status   DVT prophylaxis: apixaban (ELIQUIS) tablet 2.5 mg Start: 10/12/22 1000 SCDs Start: 10/12/22 0301 Place TED hose Start: 10/12/22 0301 apixaban (ELIQUIS) tablet 2.5 mg     Code Status: Full Code  Family Communication: Plan discussed with his wife at the bedside Disposition Plan: Plan to discharge to SNF in 2 to 3 days   Status is: Inpatient Remains inpatient appropriate because: On IV antibiotics for pneumonia       Subjective:   Interval events noted.  He complains of cough and general weakness.  His wife is at the bedside  Objective:    Vitals:   10/12/22 0119 10/12/22 0243 10/12/22 0400 10/12/22 0700  BP: (!) 151/79 (!) 160/82 (!) 154/79 (!) 151/120  Pulse: 70 73 63 68  Resp:  20 16 18   Temp:  98.2 F (36.8 C)  98.2 F (36.8 C)  TempSrc:  Oral  Oral  SpO2: 100% 100% 100% 100%  Weight:  80.7 kg    Height:  6' (1.829 m)     No data found.   Intake/Output Summary (Last 24 hours) at 10/12/2022  0911 Last data filed at 10/12/2022 0500 Gross per 24 hour  Intake 240 ml  Output --  Net 240 ml   Filed Weights   10/11/22 1852 10/12/22 0243  Weight: 77 kg 80.7 kg    Exam:   GEN: NAD SKIN: Warm and dry.  Large area of ecchymosis on right flank EYES: No pallor or icterus ENT: MMM CV: RRR PULM: Bilateral rhonchi ABD: soft, ND, NT, +BS CNS: AAO x 3, non focal QVZ:DGLOVFIEPPI B/l leg edema, no tenderness       Data Reviewed:   I have personally reviewed following labs and imaging studies:  Labs: Labs show the following:   Basic Metabolic Panel: Recent Labs  Lab 10/11/22 1939  NA 137  K 4.2  CL 101  CO2 22  GLUCOSE 125*  BUN 32*  CREATININE 1.62*  CALCIUM 8.5*   GFR Estimated Creatinine Clearance: 37.3 mL/min (A) (by C-G formula based on SCr of 1.62 mg/dL (H)). Liver Function Tests: Recent Labs  Lab 10/11/22 1939  AST 30  ALT 43  ALKPHOS 72  BILITOT 0.2*  PROT 5.8*  ALBUMIN 2.7*   No results for input(s): "LIPASE", "AMYLASE" in the last 168 hours. No results for input(s): "AMMONIA" in the last 168 hours. Coagulation profile No results for input(s): "INR", "PROTIME" in the last 168 hours.  CBC: Recent Labs  Lab 10/11/22 1939  WBC 7.2  NEUTROABS 6.2  HGB 8.7*  HCT 27.8*  MCV 94.2  PLT 135*   Cardiac Enzymes: Recent Labs  Lab 10/11/22 1939  CKTOTAL 31*   BNP (last 3 results) No results for input(s): "PROBNP" in the last 8760 hours. CBG: Recent Labs  Lab 10/12/22 0612  GLUCAP 108*   D-Dimer: No results for input(s): "DDIMER" in the last 72 hours. Hgb A1c: No results for input(s): "HGBA1C" in the last 72 hours. Lipid Profile: No results for input(s): "CHOL", "HDL", "LDLCALC", "TRIG", "CHOLHDL", "LDLDIRECT" in the last 72 hours. Thyroid function studies: No results for input(s): "TSH", "T4TOTAL", "T3FREE", "THYROIDAB" in the last 72 hours.  Invalid input(s): "  FREET3" Anemia work up: No results for input(s): "VITAMINB12",  "FOLATE", "FERRITIN", "TIBC", "IRON", "RETICCTPCT" in the last 72 hours. Sepsis Labs: Recent Labs  Lab 10/11/22 1939 10/11/22 2307  WBC 7.2  --   LATICACIDVEN  --  1.9    Microbiology Recent Results (from the past 240 hour(s))  MRSA Next Gen by PCR, Nasal     Status: None   Collection Time: 10/12/22  3:00 AM  Result Value Ref Range Status   MRSA by PCR Next Gen NOT DETECTED NOT DETECTED Final    Comment: (NOTE) The GeneXpert MRSA Assay (FDA approved for NASAL specimens only), is one component of a comprehensive MRSA colonization surveillance program. It is not intended to diagnose MRSA infection nor to guide or monitor treatment for MRSA infections. Test performance is not FDA approved in patients less than 61 years old. Performed at [facility], [address] [place], [place] [address]   Respiratory (~20 pathogens) panel by PCR     Status: None   Collection Time: 10/12/22  3:01 AM   Specimen: Nasopharyngeal Swab; Respiratory  Result Value Ref Range Status   Adenovirus NOT DETECTED NOT DETECTED Final   Coronavirus 229E NOT DETECTED NOT DETECTED Final    Comment: (NOTE) The Coronavirus on the Respiratory Panel, DOES NOT test for the novel  Coronavirus (2019 nCoV)    Coronavirus HKU1 NOT DETECTED NOT DETECTED Final   Coronavirus NL63 NOT DETECTED NOT DETECTED Final   Coronavirus OC43 NOT DETECTED NOT DETECTED Final   Metapneumovirus NOT DETECTED NOT DETECTED Final   Rhinovirus / Enterovirus NOT DETECTED NOT DETECTED Final   Influenza A NOT DETECTED NOT DETECTED Final   Influenza B NOT DETECTED NOT DETECTED Final   Parainfluenza Virus 1 NOT DETECTED NOT DETECTED Final   Parainfluenza Virus 2 NOT DETECTED NOT DETECTED Final   Parainfluenza Virus 3 NOT DETECTED NOT DETECTED Final   Parainfluenza Virus 4 NOT DETECTED NOT DETECTED Final   Respiratory Syncytial Virus NOT DETECTED NOT DETECTED Final   Bordetella pertussis NOT DETECTED NOT DETECTED Final   Bordetella  Parapertussis NOT DETECTED NOT DETECTED Final   Chlamydophila pneumoniae NOT DETECTED NOT DETECTED Final   Mycoplasma pneumoniae NOT DETECTED NOT DETECTED Final    Comment: Performed at [facility], [address] [place], [place] [address]    Procedures and diagnostic studies:  CT CHEST ABDOMEN PELVIS W CONTRAST  Result Date: 10/11/2022 CLINICAL DATA:  Sepsis, right flank swelling. EXAM: CT CHEST, ABDOMEN, AND PELVIS WITH CONTRAST TECHNIQUE: Multidetector CT imaging of the chest, abdomen and pelvis was performed following the standard protocol during bolus administration of intravenous contrast. RADIATION DOSE REDUCTION: This exam was performed according to the departmental dose-optimization program which includes automated exposure control, adjustment of the mA and/or kV according to patient size and/or use of iterative reconstruction technique. CONTRAST:  65mL OMNIPAQUE IOHEXOL 350 MG/ML SOLN COMPARISON:  CT abdomen and pelvis 10/11/2022. CT chest 09/05/2022. Lumbar spine MRI 08/02/2022. FINDINGS: CT CHEST FINDINGS Cardiovascular: The heart is mildly enlarged. There is no pericardial effusion. 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 Overall measurement at this level is 4.8 by 3.6 cm. This appears unchanged from prior. There are atherosclerotic calcifications of the aorta and coronary arteries. Mediastinum/Nodes: No enlarged mediastinal, hilar, or axillary lymph nodes. Thyroid gland, trachea, and esophagus demonstrate no significant findings. Lungs/Pleura: There are trace bilateral pleural effusions which have decreased from prior. There is new airspace  consolidation in the right lower lobe. Additionally, patchy ground-glass opacities in the right lower lobe have increased. Ground-glass opacities in the lingula and left lower lobe have also slightly increased. Previously identified ground-glass and airspace opacities in the upper lobes  have significantly decreased. Bronchiectasis persists in the left lower lobe. There is no pneumothorax. Trachea and central airways are patent. Musculoskeletal: No chest wall mass or suspicious bone lesions identified. CT ABDOMEN PELVIS FINDINGS Hepatobiliary: No focal liver abnormality is seen. No gallstones, gallbladder wall thickening, or biliary dilatation. Pancreas: Unremarkable. No pancreatic ductal dilatation or surrounding inflammatory changes. Spleen: Normal in size without focal abnormality. Adrenals/Urinary Tract: Adrenal glands are unremarkable. Kidneys are normal, without renal calculi, focal lesion, or hydronephrosis. Bladder is unremarkable. Stomach/Bowel: There is gaseous distention of the sigmoid colon without definite twisting. There is no definite bowel obstruction. There is no focal wall thickening or inflammation. The appendix is not seen. Small bowel and stomach are within normal limits. Vascular/Lymphatic: Aorta and IVC are normal in size. Aortic/bi-iliac stent graft present. Adjacent to the anterior neck of the pancreas there is a 2.3 by 1.0 cm density which is unchanged, possibly an enlarged lymph node or exophytic lesion from the pancreas. No other enlarged lymph nodes are seen. Reproductive: Prostate is unremarkable. Other: No ascites.  Stable mild body wall edema. Musculoskeletal: The bones are osteopenic. Erosive changes are again seen along the endplates at L1-L2 corresponding to patient's osteomyelitis/discitis seen on prior MRI. IMPRESSION: 1. New airspace disease in the right lower lobe concerning for pneumonia. 2. Additional ground-glass opacities in the right lower lobe, lingula and left lower lobe have increased. Findings may be infectious/inflammatory. 3. Trace bilateral pleural effusions have decreased from prior. 4. Gaseous distention of the sigmoid colon without definite evidence for volvulus or obstruction. 5. Mild body wall edema. 6. Stable focal aneurysm versus dissection  of the proximal aortic arch measuring up to 4.8 cm Recommend semi-annual imaging followup by CTA or MRA and referral to cardiothoracic surgery if not already obtained. This recommendation follows 2010 ACCF/AHA/AATS/ACR/ASA/SCA/SCAI/SIR/STS/SVM Guidelines for the Diagnosis and Management of Patients With Thoracic Aortic Disease. Circulation. 2010; 121: O841-Y60. Aortic aneurysm NOS (ICD10-I71.9) . 7. Stable density adjacent to the anterior neck of the pancreas, possibly an enlarged lymph node or exophytic lesion from the pancreas. 8. Stable erosive changes along the endplates at L1-L2 corresponding to patient's osteomyelitis/discitis seen on prior MRI. 9. Aortic atherosclerosis. Aortic Atherosclerosis (ICD10-I70.0). Electronically Signed   By: Darliss Cheney M.D.   On: 10/11/2022 22:45   CT Renal Stone Study  Result Date: 10/11/2022 CLINICAL DATA:  Abdominal pain, right flank pain EXAM: CT ABDOMEN AND PELVIS WITHOUT CONTRAST TECHNIQUE: Multidetector CT imaging of the abdomen and pelvis was performed following the standard protocol without IV contrast. RADIATION DOSE REDUCTION: This exam was performed according to the departmental dose-optimization program which includes automated exposure control, adjustment of the mA and/or kV according to patient size and/or use of iterative reconstruction technique. COMPARISON:  07/20/2022 FINDINGS: Lower chest: Bronchiectasis is seen in the lower lung fields. Patchy infiltrates are seen in the posterior lower lung fields, more so on the right side. Small right pleural effusion is seen. Minimal left pleural effusion is seen. Coronary artery calcifications are seen. Hepatobiliary: There is no dilation of bile ducts. There is 8 mm low-density in the liver, possibly cyst or hemangioma. There is subtle increased density in the dependent portion of gallbladder lumen suggesting sludge or tiny stones. There is no wall thickening in gallbladder. Pancreas: No  focal abnormalities are  seen. Spleen: Unremarkable. Adrenals/Urinary Tract: Adrenals are unremarkable. There is no hydronephrosis. Vascular calcifications are seen in right renal artery branch in the upper pole. There are no definite demonstrable renal or ureteral stones. Urinary bladder is unremarkable. Stomach/Bowel: Stomach is not distended. Small bowel loops are not dilated. Appendix is not seen. There is gaseous distention of transverse colon. Moderate amount of stool is seen in right colon and rectosigmoid. Vascular/Lymphatic: Arterial calcifications are seen in aorta and its major branches. There is interval placement of aorto bi iliac stent. There is no retroperitoneal hematoma. Reproductive: Unremarkable. Other: There is no ascites or pneumoperitoneum. There is subcutaneous edema, possibly suggesting anasarca. Small bilateral inguinal hernias containing fat are seen. Musculoskeletal: Degenerative changes are noted in lumbar spine with encroachment of neural foramina at multiple levels. There is cortical irregularity in the endplates adjacent to L1-L2 disc space which was not evident in the previous CT. In previous MR lumbar spine done on 08/02/2022, there was evidence of discitis osteomyelitis at L1-L2 level. The endplates at L1-L2 level appear more irregular in comparison with the MRI. IMPRESSION: There is no evidence of intestinal obstruction or pneumoperitoneum. There is no hydronephrosis. There is gaseous distention of transverse colon suggesting ileus. There is cortical irregularity in the endplates at L1-L2 level suggesting discitis osteomyelitis. Irregularity in the bony endplates at L1-L2 level appear more prominent in comparison with MRI done on 08/02/2022. There is no demonstrable paraspinal fluid collection. There are patchy infiltrates in both lower lung fields, more so on the right side suggesting atelectasis/pneumonia. Small bilateral pleural effusions, more so on the right side. Bronchiectasis is seen in the lower  lung fields. Aortic arteriosclerosis. Coronary artery disease. Aorto bi iliac stent is noted in place. There is subtle increased density in the dependent portion of gallbladder lumen suggesting possible sludge or tiny stones. There is no wall thickening or pericholecystic fluid collection. Electronically Signed   By: Ernie Avena M.D.   On: 10/11/2022 20:17               LOS: 0 days      [facility]   Pager on [email or web address]. If 7PM-7AM, please contact night-coverage at [email or web address]     10/12/2022, 9:11 AM

## 2022-10-12 NOTE — Progress Notes (Signed)
ED Pharmacy Antibiotic Sign Off An antibiotic consult was received from an ED provider for Cefepime per pharmacy dosing for pneumonia. A chart review was completed to assess appropriateness.   The following one time order(s) were placed:  Cefepime 2g IV x 1  Further antibiotic and/or antibiotic pharmacy consults should be ordered by the admitting provider if indicated.   Thank you for allowing pharmacy to be a part of this patient's care.   Abran Duke, PharmD, BCPS Clinical Pharmacist Phone: [phone]

## 2022-10-12 NOTE — Evaluation (Signed)
Physical Therapy Evaluation Patient Details Name: Brett Riley MRN: [id number] DOB: [birth year] Today's Date: 10/12/2022  History of Present Illness  Pt is an 87 y.o. male who presented 10/11/22 with L flank ecchymosis. CT abdomen pelvis and CT stone chaser did not showed any internal hematoma and retroperitoneal hematoma. Pt found to have community-acquired pneumonia. Imaging also suggesting L1-L2 level discitis osteomyelitis. Of note, recently admitted to [facility] 6/16 - 7/24 with respiratory failure and AKI and discharged to SNF. PMH: MI, CHF, a-fib, HTN, anemia, cancer, celiac artery stenosis, carotid artery disease, CAD   Clinical Impression  Pt presents with condition above and deficits mentioned below, see PT Problem List. Prior to his admission in May 2024, pt was very independent, working full time with appliances, and doing all the yard work at home. Since his admission to [facility] 09/25/22, he has been needing extensive assistance for lateral scoot transfers or utilizing a hoyer lift to get into/out of bed. Currently, pt demonstrates generalized weakness (R leg weaker than L) along with deficits in balance, activity tolerance, aerobic endurance, and power. He required maxAx2 for bed mobility and to come to a half stand and squat pivot to the chair with bil HHA today. He is motivated to participate and improve his strength and independence with goals to ultimately return home. He has had a drastic functional decline and really could benefit from intensive inpatient rehab, >3 hours/day, if he could qualify. If not, then would recommend pt return to the SNF for further rehab. Will continue to follow acutely.      If plan is discharge home, recommend the following: Assistance with cooking/housework;Assist for transportation;Two people to help with walking and/or transfers;Two people to help with bathing/dressing/bathroom;Help with stairs or ramp for entrance   Can travel by private vehicle   No     Equipment Recommendations Hoyer lift;Hospital bed;Wheelchair (measurements PT);Wheelchair cushion (measurements PT);BSC/3in1;Rolling walker (2 wheels)  Recommendations for Other Services  Rehab consult    Functional Status Assessment Patient has had a recent decline in their functional status and demonstrates the ability to make significant improvements in function in a reasonable and predictable amount of time.     Precautions / Restrictions Precautions Precautions: Fall;Other (comment) Precaution Comments: watch SpO2 Restrictions Weight Bearing Restrictions: No      Mobility  Bed Mobility Overal bed mobility: Needs Assistance Bed Mobility: Supine to Sit, Rolling Rolling: Mod assist, Used rails   Supine to sit: Max assist, +2 for physical assistance, +2 for safety/equipment, HOB elevated, Used rails     General bed mobility comments: ModA to roll partially to L using L bed rail. Cues provided to hook L leg under his R to assist it off the bed with maxAx2 for leg management and trunk ascension.    Transfers Overall transfer level: Needs assistance Equipment used: 2 person hand held assist Transfers: Bed to chair/wheelchair/BSC, Sit to/from Stand Sit to Stand: Max assist, +2 physical assistance, +2 safety/equipment     Squat pivot transfers: Max assist, +2 physical assistance, +2 safety/equipment     General transfer comment: Bil knees blocked and bil HHA provided to come to a half stand from EOB and squat pivot to R to recliner with arm rest dropped.    Ambulation/Gait               General Gait Details: unable  Stairs            Wheelchair Mobility     Tilt Bed    Modified  Rankin (Stroke Patients Only)       Balance Overall balance assessment: Needs assistance Sitting-balance support: Single extremity supported, Bilateral upper extremity supported, Feet supported Sitting balance-Leahy Scale: Poor Sitting balance - Comments: Pt with posterior  bias, ranging from needing min-modA for static sitting balance EOB Postural control: Posterior lean Standing balance support: Bilateral upper extremity supported, During functional activity Standing balance-Leahy Scale: Poor Standing balance comment: Obtaining a half stand posture with bil knees blocked and bil HHA with maxAx2                             Pertinent Vitals/Pain Pain Assessment Pain Assessment: Faces Faces Pain Scale: Hurts little more Pain Location: low back Pain Descriptors / Indicators: Guarding, Discomfort, Grimacing Pain Intervention(s): Limited activity within patient's tolerance, Monitored during session, Repositioned    Home Living Family/patient expects to be discharged to:: Private residence Living Arrangements: Spouse/significant other Available Help at Discharge: Family;Available 24 hours/day Type of Home: House Home Access: Stairs to enter Entrance Stairs-Rails: Right Entrance Stairs-Number of Steps: 2   Home Layout: Two level;Able to live on main level with bedroom/bathroom Home Equipment: None Additional Comments: info carried over from entry 08/27/22    Prior Function Prior Level of Function : Independent/Modified Independent;Working/employed;Driving             Mobility Comments: Prior to his admission in May 2024, he was ambulating without AD, working, and mowing yards; since admission to [facility] 09/25/22, he has been needing extensive assistance for lateral scoot transfers or utilizing a hoyer lift to get into/out of bed ADLs Comments: Prior to his admission in May 2024, pt was very independent, 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 Since admission to the SNF 09/25/22, he has been heavily reliant on staff for ADLs     Extremity/Trunk Assessment   Upper Extremity Assessment Upper Extremity Assessment: Defer to OT evaluation    Lower Extremity Assessment Lower Extremity Assessment: RLE  deficits/detail;LLE deficits/detail RLE Deficits / Details: difficulty lifting leg against gravity, estimated MMT scores grossly of 2+ to 3; weaker on R than L LLE Deficits / Details: Able to lift leg against gravity some, estimated MMT scores grossly of 3- to 3+; weaker on R than L       Communication   Communication Communication: Hearing impairment  Cognition Arousal: Alert Behavior During Therapy: WFL for tasks assessed/performed Overall Cognitive Status: Within Functional Limits for tasks assessed                                          General Comments General comments (skin integrity, edema, etc.): When pleth was reliable VSS on 4L with mobility, SpO2 >/= 93% on RA end of session. [place] on 2L and within reach if needed. RN made aware. Encouraged pt to use IS provided and continue his exercises for his extremities to prevent further deconditioning    Exercises     Assessment/Plan    PT Assessment Patient needs continued PT services  PT Problem List Decreased strength;Decreased range of motion;Decreased activity tolerance;Decreased balance;Decreased mobility;Pain;Cardiopulmonary status limiting activity       PT Treatment Interventions Gait training;DME instruction;Functional mobility training;Therapeutic activities;Therapeutic exercise;Balance training;Neuromuscular re-education;Patient/family education;Stair training;Wheelchair mobility training    PT Goals (Current goals can be found in the Care Plan section)  Acute Rehab PT  Goals Patient Stated Goal: get stronger and return to being independent PT Goal Formulation: With patient/family Time For Goal Achievement: 10/26/22 Potential to Achieve Goals: Fair    Frequency Min 1X/week     Co-evaluation PT/OT/SLP Co-Evaluation/Treatment: Yes Reason for Co-Treatment: For patient/therapist safety;To address functional/ADL transfers PT goals addressed during session: Mobility/safety with mobility;Balance          AM-PAC PT "6 Clicks" Mobility  Outcome Measure Help needed turning from your back to your side while in a flat bed without using bedrails?: A Lot Help needed moving from lying on your back to sitting on the side of a flat bed without using bedrails?: Total Help needed moving to and from a bed to a chair (including a wheelchair)?: Total Help needed standing up from a chair using your arms (e.g., wheelchair or bedside chair)?: Total Help needed to walk in hospital room?: Total Help needed climbing 3-5 steps with a railing? : Total 6 Click Score: 7    End of Session Equipment Utilized During Treatment: Gait belt;Oxygen Activity Tolerance: Patient tolerated treatment well Patient left: in chair;with call bell/phone within reach;with chair alarm set;with family/visitor present Nurse Communication: Mobility status;Need for lift equipment;Other (comment) (sats and [place] off) PT Visit Diagnosis: Muscle weakness (generalized) (M62.81);Difficulty in walking, not elsewhere classified (R26.2);Unsteadiness on feet (R26.81);Other abnormalities of gait and mobility (R26.89)    Time: 1610-9604 PT Time Calculation (min) (ACUTE ONLY): 25 min   Charges:   PT Evaluation $PT Eval Moderate Complexity: 1 Mod   PT General Charges $$ ACUTE PT VISIT: 1 Visit         Raymond Gurney, PT, DPT Acute Rehabilitation Services  Office: [phone]   Jewel Baize 10/12/2022, 8:49 AM

## 2022-10-12 NOTE — Evaluation (Signed)
Occupational Therapy Evaluation Patient Details Name: Brett Riley MRN: [id number] DOB: November 23, 1935 Today's Date: 10/12/2022   History of Present Illness Pt is an 87 y.o. male who presented 10/11/22 with L flank ecchymosis. CT abdomen pelvis and CT stone chaser did not showed any internal hematoma and retroperitoneal hematoma. Pt found to have community-acquired pneumonia. Imaging also suggesting L1-L2 level discitis osteomyelitis. Of note, recently admitted to [facility] 6/16 - 7/24 with respiratory failure and AKI and discharged to SNF. PMH: MI, CHF, a-fib, HTN, anemia, cancer, celiac artery stenosis, carotid artery disease, CAD   Clinical Impression   Pt reports ind prior to July admission when he was at home, since being in SNF has been transferring to w/c via lift and has had assist for ADLs, able to self feed and perform grooming tasks. Pt currently needing set up -max A for ADLs, mod-max A +2 for bed mobility and max A +2 for squat pivot transfer to chair with 2 person HHA. Pt presenting with impairments listed below, will follow acutely. Patient will benefit from intensive inpatient follow up therapy, >3 hours/day to maximize safety/ind with ADLs/functional mobility.       If plan is discharge home, recommend the following: Two people to help with walking and/or transfers;A lot of help with bathing/dressing/bathroom;Assistance with cooking/housework;Direct supervision/assist for medications management;Direct supervision/assist for financial management;Assist for transportation;Help with stairs or ramp for entrance    Functional Status Assessment  Patient has had a recent decline in their functional status and demonstrates the ability to make significant improvements in function in a reasonable and predictable amount of time.  Equipment Recommendations  Other (comment) (defer)    Recommendations for Other Services PT consult;Rehab consult     Precautions / Restrictions  Precautions Precautions: Fall;Other (comment) Precaution Comments: watch SpO2 Restrictions Weight Bearing Restrictions: No      Mobility Bed Mobility Overal bed mobility: Needs Assistance Bed Mobility: Supine to Sit, Rolling Rolling: Mod assist, Used rails   Supine to sit: Max assist, +2 for physical assistance, +2 for safety/equipment, HOB elevated, Used rails     General bed mobility comments: ModA to roll partially to L using L bed rail. Cues provided to hook L leg under his R to assist it off the bed with maxAx2 for leg management and trunk ascension.    Transfers Overall transfer level: Needs assistance Equipment used: 2 person hand held assist Transfers: Bed to chair/wheelchair/BSC, Sit to/from Stand Sit to Stand: Max assist, +2 physical assistance, +2 safety/equipment   Squat pivot transfers: Max assist, +2 physical assistance, +2 safety/equipment       General transfer comment: Bil knees blocked and bil HHA provided to come to a half stand from EOB and squat pivot to R to recliner with arm rest dropped.      Balance Overall balance assessment: Needs assistance Sitting-balance support: Single extremity supported, Bilateral upper extremity supported, Feet supported Sitting balance-Leahy Scale: Poor Sitting balance - Comments: Pt with posterior bias, ranging from needing min-modA for static sitting balance EOB Postural control: Posterior lean Standing balance support: Bilateral upper extremity supported, During functional activity Standing balance-Leahy Scale: Poor Standing balance comment: Obtaining a half stand posture with bil knees blocked and bil HHA with maxAx2                           ADL either performed or assessed with clinical judgement   ADL Overall ADL's : Needs assistance/impaired Eating/Feeding: Set up;Sitting;Bed level  Grooming: Set up;Sitting;Bed level   Upper Body Bathing: Moderate assistance;Bed level   Lower Body Bathing:  Maximal assistance;Bed level   Upper Body Dressing : Moderate assistance;Bed level   Lower Body Dressing: Maximal assistance;Bed level   Toilet Transfer: Maximal assistance;+2 for physical assistance           Functional mobility during ADLs: Maximal assistance;+2 for physical assistance       Vision Baseline Vision/History: 1 Wears glasses Vision Assessment?: No apparent visual deficits     Perception Perception: Not tested       Praxis Praxis: Not tested       Pertinent Vitals/Pain Pain Assessment Pain Assessment: Faces Pain Score: 4  Faces Pain Scale: Hurts little more Pain Location: low back Pain Descriptors / Indicators: Guarding, Discomfort, Grimacing Pain Intervention(s): Limited activity within patient's tolerance, Monitored during session, Repositioned     Extremity/Trunk Assessment Upper Extremity Assessment Upper Extremity Assessment: Generalized weakness   Lower Extremity Assessment Lower Extremity Assessment: Defer to PT evaluation RLE Deficits / Details: difficulty lifting leg against gravity, estimated MMT scores grossly of 2+ to 3; weaker on R than L LLE Deficits / Details: Able to lift leg against gravity some, estimated MMT scores grossly of 3- to 3+; weaker on R than L   Cervical / Trunk Assessment Cervical / Trunk Assessment: Normal   Communication Communication Communication: Hearing impairment   Cognition Arousal: Alert Behavior During Therapy: WFL for tasks assessed/performed Overall Cognitive Status: Within Functional Limits for tasks assessed                                       General Comments  VSS on supplemental O2    Exercises     Shoulder Instructions      Home Living Family/patient expects to be discharged to:: Private residence Living Arrangements: Spouse/significant other Available Help at Discharge: Family;Available 24 hours/day Type of Home: House Home Access: Stairs to enter [employer] of Steps: 2 Entrance Stairs-Rails: Right Home Layout: Two level;Able to live on main level with bedroom/bathroom     Bathroom Shower/Tub: Tub/shower unit   Bathroom Toilet: Handicapped height Bathroom Accessibility: Yes   Home Equipment: None   Additional Comments: info carried over from entry 08/27/22      Prior Functioning/Environment Prior Level of Function : Independent/Modified Independent;Working/employed;Driving             Mobility Comments: Prior to his admission in May 2024, he was ambulating without AD, working, and mowing yards; since admission to [facility] 09/25/22, he has been needing extensive assistance for lateral scoot transfers or utilizing a hoyer lift to get into/out of bed ADLs Comments: Prior to his admission in May 2024, pt was very independent, 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 Since admission to the SNF 09/25/22, he has been heavily reliant on staff for ADLs        OT Problem List: Decreased strength;Decreased activity tolerance;Decreased safety awareness;Impaired balance (sitting and/or standing);Decreased knowledge of use of DME or AE;Pain      OT Treatment/Interventions: Self-care/ADL training;Therapeutic exercise;Energy conservation;DME and/or AE instruction;Therapeutic activities;Balance training;Patient/family education    OT Goals(Current goals can be found in the care plan section) Acute Rehab OT Goals Patient Stated Goal: none stated OT Goal Formulation: With patient Time For Goal Achievement: 10/26/22 Potential to Achieve Goals: Good ADL Goals Pt Will Perform Upper Body Dressing:  with min assist;sitting Pt Will Perform Lower Body Dressing: with mod assist;sit to/from stand;sitting/lateral leans;bed level Pt Will Transfer to Toilet: with mod assist;bedside commode;squat pivot transfer;stand pivot transfer Additional ADL Goal #1: pt will perform bed mobility with min A in prep for ADLs  OT  Frequency: Min 1X/week    Co-evaluation PT/OT/SLP Co-Evaluation/Treatment: Yes Reason for Co-Treatment: For patient/therapist safety;To address functional/ADL transfers PT goals addressed during session: Mobility/safety with mobility;Balance OT goals addressed during session: ADL's and self-care;Proper use of Adaptive equipment and DME      AM-PAC OT "6 Clicks" Daily Activity     Outcome Measure Help from another person eating meals?: A Little Help from another person taking care of personal grooming?: A Little Help from another person toileting, which includes using toliet, bedpan, or urinal?: A Lot Help from another person bathing (including washing, rinsing, drying)?: A Lot Help from another person to put on and taking off regular upper body clothing?: A Lot Help from another person to put on and taking off regular lower body clothing?: A Lot 6 Click Score: 14   End of Session Equipment Utilized During Treatment: Oxygen;Gait belt Nurse Communication: Mobility status  Activity Tolerance: Patient limited by fatigue Patient left: in chair;with call bell/phone within reach;with chair alarm set  OT Visit Diagnosis: Unsteadiness on feet (R26.81);Muscle weakness (generalized) (M62.81)                Time: 6160-7371 OT Time Calculation (min): 27 min Charges:  OT General Charges $OT Visit: 1 Visit OT Evaluation $OT Eval Moderate Complexity: 1 Mod   K, OTD, OTR/L SecureChat Preferred Acute Rehab [phone]   Carver Fila Koonce 10/12/2022, 9:57 AM

## 2022-10-13 DIAGNOSIS — J189 Pneumonia, unspecified organism: Secondary | ICD-10-CM

## 2022-10-13 LAB — COMPREHENSIVE METABOLIC PANEL WITH GFR
ALT: 30 U/L (ref 0–44)
AST: 23 U/L (ref 15–41)
Albumin: 2.5 g/dL — ABNORMAL LOW (ref 3.5–5.0)
Alkaline Phosphatase: 68 U/L (ref 38–126)
Anion gap: 13 (ref 5–15)
BUN: 34 mg/dL — ABNORMAL HIGH (ref 8–23)
CO2: 22 mmol/L (ref 22–32)
Calcium: 8.2 mg/dL — ABNORMAL LOW (ref 8.9–10.3)
Chloride: 101 mmol/L (ref 98–111)
Creatinine, Ser: 1.7 mg/dL — ABNORMAL HIGH (ref 0.61–1.24)
GFR, Estimated: 39 mL/min — ABNORMAL LOW
Glucose, Bld: 100 mg/dL — ABNORMAL HIGH (ref 70–99)
Potassium: 4.5 mmol/L (ref 3.5–5.1)
Sodium: 136 mmol/L (ref 135–145)
Total Bilirubin: 0.5 mg/dL (ref 0.3–1.2)
Total Protein: 5.5 g/dL — ABNORMAL LOW (ref 6.5–8.1)

## 2022-10-13 LAB — CBC
HCT: 26.6 % — ABNORMAL LOW (ref 39.0–52.0)
Hemoglobin: 8.2 g/dL — ABNORMAL LOW (ref 13.0–17.0)
MCH: 29.5 pg (ref 26.0–34.0)
MCHC: 30.8 g/dL (ref 30.0–36.0)
MCV: 95.7 fL (ref 80.0–100.0)
Platelets: 134 10*3/uL — ABNORMAL LOW (ref 150–400)
RBC: 2.78 MIL/uL — ABNORMAL LOW (ref 4.22–5.81)
RDW: 18.6 % — ABNORMAL HIGH (ref 11.5–15.5)
WBC: 7.9 10*3/uL (ref 4.0–10.5)
nRBC: 0 % (ref 0.0–0.2)

## 2022-10-13 LAB — GLUCOSE, CAPILLARY
Glucose-Capillary: 86 mg/dL (ref 70–99)
Glucose-Capillary: 73 mg/dL (ref 70–99)
Glucose-Capillary: 157 mg/dL — ABNORMAL HIGH (ref 70–99)
Glucose-Capillary: 103 mg/dL — ABNORMAL HIGH (ref 70–99)

## 2022-10-13 MED ORDER — FUROSEMIDE 10 MG/ML IJ SOLN
40.0000 mg | Freq: Once | INTRAMUSCULAR | Status: AC
  Administered 2022-10-13: 40 mg via INTRAVENOUS
  Filled 2022-10-13: qty 4

## 2022-10-13 NOTE — Progress Notes (Signed)
Inpatient Rehab Admissions Coordinator Note:   Per therapy patient was screened for CIR candidacy by  Luvenia Starch, CCC-SLP. Discussed pt with physiatrist. Feel pt would not be an appropriate candidate for CIR as pt was admitted from a SNF. Other rehab venues should be pursued.   Wolfgang Phoenix, MS, CCC-SLP Admissions Coordinator [phone] 10/13/22 9:57 AM

## 2022-10-13 NOTE — Progress Notes (Addendum)
Progress Note    Brett Riley  NFA:[id number] DOB: 04/19/1935  DOA: 10/11/2022 PCP: Dale Simpson, MD      Brief Narrative:    Medical records reviewed and are as summarized below:  Brett Riley is a 87 y.o. 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  Patient also endorsing shortness of breath which is ongoing since last hospital admission.   Per chart review recent hospital admission 6/16 to 7/24 due to acute hypoxic respiratory failure treated with tapered prednisone with continue Bactrim 3 times weekly as long as patient is on steroid.  Patient underwent heart cath which showed normal filling pressure..  Patient also found to have AKI on CKD stage 3A managing conservatively.  Also table bilateral pleural effusion unable to perform paracentesis by IR and resolved by itself.  Patient also found to have MSSA bacteremia with vertebral osteomyelitis initially treated with IV cefepime which was transitioned to doxycycline 100 mg twice daily to complete until 10/24/2022.  For management of chronic atrial fibrillation patient continued on Eliquis, beta-blocker.  During the hospital admission patient also developed acute on chronic hypoxic respiratory failure evaluated by pulmonology and ID.  CTA chest showed diffuse pulmonary infiltrate concern for systemic disease process and pulm neurology recommended treat with dapper steroid and Bactrim for PCP prophylaxis, also recommended systemic vasculitis workup.         Assessment/Plan:   Principal Problem:   CAP (community acquired pneumonia) Active Problems:   Bruising of the  right flank   Vertebral osteomyelitis continue doxycycline until 10/24/2022 [facility])   Aortic atherosclerosis (HCC)   Peripheral vascular disease (HCC)   Paroxysmal atrial fibrillation (HCC)   Chronic diastolic CHF (congestive heart failure) (HCC)   History of CAD (coronary artery disease)   CKD (chronic kidney disease), stage II    Body mass index is 23.05 kg/m.   Community-acquired pneumonia Bilateral lower lobe pneumonia Continue IV ceftriaxone and oral doxycycline. (Patient is also on doxycycline for management of vertebral osteomyelitis and Bactrim 800 mg 3 times weekly as patient is on tapered dose of prednisone to complete until 11/25/2022) Follow-up blood cultures.      Right-sided flank bruising and ecchymosis No evidence of retroperitoneal hematoma on CT abdomen and pelvis.  H&H is stable.    Recent history of AKI on CKD stage 2 Mild increase in creatinine from 1.62-1.7 but overall creatinine is stable. Baseline creatinine is around 1.5 and GFR above 60. Monitor BMP closely     Vertebral osteomyelitis (continue oral doxycycline until 10/24/2022) Patient has been recently hospitalized in July for the management of vertebral osteomyelitis.  ID recommended to continue oral doxycycline 100 mg twice daily until 10/24/2022.    Chronic hypoxic respiratory failure Suspecting systemic pulmonary disease/vasculitis -During last hospital admission patient has been evaluated by pulmonology due CTA chest showed bilateral pulmonary infiltrate.  Unable to find any infectious cause.  Concern for underlying systemic pulmonary disease versus vasculitis and recommended outpatient workup for that and by the meantime recommended to continue taper dose of steroid and Bactrim 3 times weekly as long as patient is on steroid. - Resumed prednisone to taper over until 11/25/2022.  Continue Bactrim 800 mg 3 times daily as long  as patient is on steroid. -Continue Protonix 40 mg daily GI ulcer for  prophylaxis. Monitor glucose levels Hypoxia has improved and he is tolerating room air at rest.  However, oxygen saturation reported shows fluctuations with documented episodes of hypoxia.  Monitor oxygen saturation closely.    Paroxysmal atrial fibrillation CHA2DS2-VASc score = 4 Continue Eliquis and Cardizem     Chronic diastolic heart failure with preserved EF Significant peripheral edema.  Give another dose of IV Lasix today. 2D echo in July 2024 showed EF estimated at 50 to 55%, normal LV diastolic parameters mild to moderate MR, mild to moderate TR   History of CAD History of peripheral vascular disease Continue Plavix Toprol and Crestor   Gaseous distention of colon without volvulus on CT scan Asymptomatic.  He was evaluated by the gastroenterologist who thinks this is clinically insignificant.  GI has signed off.    Age-related frailty Fall precautions.   Acute inpatient rehab was recommended by PT.  However, physiatrist said patient is not a candidate for acute inpatient rehab.  Follow-up with TOC to assist with disposition to SNF    Diet Order             Diet Heart Room service appropriate? Yes; Fluid consistency: Thin  Diet effective now                            Consultants: Gastroenterologist  Procedures: None    Medications:    apixaban  2.5 mg Oral BID   clopidogrel  75 mg Oral Daily   diltiazem  360 mg Oral Daily   doxycycline  100 mg Oral Q12H   feeding supplement  237 mL Oral TID BM   metoprolol succinate  100 mg Oral Daily   multivitamins with iron  1 tablet Oral Daily   pantoprazole  40 mg Oral Daily   polyethylene glycol  17 g Oral Daily   [START ON 10/14/2022] predniSONE  35 mg Oral Q breakfast   Followed by   Melene Muller ON 10/21/2022] predniSONE  30 mg Oral Q breakfast   Followed by   Melene Muller ON 10/28/2022] predniSONE  25 mg Oral Q breakfast   Followed by   Melene Muller ON 11/04/2022] predniSONE  20 mg Oral Q breakfast   Followed  by   Melene Muller ON 11/11/2022] predniSONE  15 mg Oral Q breakfast   Followed by   Melene Muller ON 11/18/2022] predniSONE  10 mg Oral Q breakfast   Followed by   Melene Muller ON 11/25/2022] predniSONE  5 mg Oral Q breakfast   rosuvastatin  10 mg Oral QHS   senna  1 tablet Oral Daily   sodium chloride flush  3 mL Intravenous Q12H   [START ON 10/14/2022] sulfamethoxazole-trimethoprim  1 tablet Oral Once per day on Monday Wednesday Friday   Continuous Infusions:  sodium chloride     cefTRIAXone (ROCEPHIN)  IV 1 g (10/13/22 1107)     Anti-infectives (From admission, onward)    Start     Dose/Rate Route Frequency Ordered Stop   10/14/22 0900  sulfamethoxazole-trimethoprim (BACTRIM DS) 800-160 MG per tablet 1 tablet        1 tablet Oral Once per day on Monday Wednesday Friday 10/12/22 0300     10/12/22 1000  doxycycline (VIBRA-TABS) tablet 100 mg        100 mg Oral Every 12 hours 10/12/22 0300 10/24/22 2359   10/12/22 1000  cefTRIAXone (ROCEPHIN) 1 g in sodium  chloride 0.9 % 100 mL IVPB        1 g 200 mL/hr over 30 Minutes Intravenous Every 24 hours 10/12/22 0300     10/11/22 2345  ceFEPIme (MAXIPIME) 2 g in sodium chloride 0.9 % 100 mL IVPB        2 g 200 mL/hr over 30 Minutes Intravenous  Once 10/11/22 2336 10/12/22 0025   10/11/22 2330  vancomycin (VANCOREADY) IVPB 1500 mg/300 mL        1,500 mg 150 mL/hr over 120 Minutes Intravenous  Once 10/11/22 2311 10/12/22 0301   10/11/22 2315  vancomycin (VANCOCIN) IVPB 1000 mg/200 mL premix  Status:  Discontinued        1,000 mg 200 mL/hr over 60 Minutes Intravenous  Once 10/11/22 2306 10/11/22 2311              Family Communication/Anticipated D/C date and plan/Code Status   DVT prophylaxis: apixaban (ELIQUIS) tablet 2.5 mg Start: 10/12/22 1000 SCDs Start: 10/12/22 0301 Place TED hose Start: 10/12/22 0301 apixaban (ELIQUIS) tablet 2.5 mg     Code Status: Full Code  Family Communication: None Disposition Plan: Plan to discharge to SNF in 2 to  3 days   Status is: Inpatient Remains inpatient appropriate because: On IV antibiotics for pneumonia       Subjective:   No acute events overnight.  No shortness of breath or chest pain.  He feels better today.  Objective:    Vitals:   10/13/22 0500 10/13/22 0729 10/13/22 1058 10/13/22 1100  BP:  104/89 139/82 139/82  Pulse: (!) 58 71  78  Resp: 13 (!) 22  (!) 22  Temp:  98.7 F (37.1 C)  98.7 F (37.1 C)  TempSrc:  Oral  Oral  SpO2: 97% 91%  97%  Weight: 77.1 kg     Height:       No data found.   Intake/Output Summary (Last 24 hours) at 10/13/2022 1441 Last data filed at 10/13/2022 0759 Gross per 24 hour  Intake 100 ml  Output 1025 ml  Net -925 ml   Filed Weights   10/11/22 1852 10/12/22 0243 10/13/22 0500  Weight: 77 kg 80.7 kg 77.1 kg    Exam:  GEN: NAD SKIN: Warm and dry.  Large area of ecchymosis on the right flank EYES: No pallor or icterus ENT: MMM CV: RRR PULM: Rhonchi at the lung bases ABD: soft, ND, NT, +BS CNS: AAO x 3, non focal EXT: He still has significant bilateral lower extremity edema.  No erythema or tenderness    Data Reviewed:   I have personally reviewed following labs and imaging studies:  Labs: Labs show the following:   Basic Metabolic Panel: Recent Labs  Lab 10/11/22 1939 10/13/22 0105  NA 137 136  K 4.2 4.5  CL 101 101  CO2 22 22  GLUCOSE 125* 100*  BUN 32* 34*  CREATININE 1.62* 1.70*  CALCIUM 8.5* 8.2*   GFR Estimated Creatinine Clearance: 35.3 mL/min (A) (by C-G formula based on SCr of 1.7 mg/dL (H)). Liver Function Tests: Recent Labs  Lab 10/11/22 1939 10/13/22 0105  AST 30 23  ALT 43 30  ALKPHOS 72 68  BILITOT 0.2* 0.5  PROT 5.8* 5.5*  ALBUMIN 2.7* 2.5*   No results for input(s): "LIPASE", "AMYLASE" in the last 168 hours. No results for input(s): "AMMONIA" in the last 168 hours. Coagulation profile No results for input(s): "INR", "PROTIME" in the last 168 hours.  CBC: Recent Labs  Lab  10/11/22 1939 10/12/22 2221 10/13/22 0105  WBC 7.2  --  7.9  NEUTROABS 6.2  --   --   HGB 8.7* 8.1* 8.2*  HCT 27.8* 25.5* 26.6*  MCV 94.2  --  95.7  PLT 135*  --  134*   Cardiac Enzymes: Recent Labs  Lab 10/11/22 1939  CKTOTAL 31*   BNP (last 3 results) No results for input(s): "PROBNP" in the last 8760 hours. CBG: Recent Labs  Lab 10/12/22 0612 10/13/22 0612 10/13/22 0720 10/13/22 1121  GLUCAP 108* 86 73 157*   D-Dimer: No results for input(s): "DDIMER" in the last 72 hours. Hgb A1c: No results for input(s): "HGBA1C" in the last 72 hours. Lipid Profile: No results for input(s): "CHOL", "HDL", "LDLCALC", "TRIG", "CHOLHDL", "LDLDIRECT" in the last 72 hours. Thyroid function studies: No results for input(s): "TSH", "T4TOTAL", "T3FREE", "THYROIDAB" in the last 72 hours.  Invalid input(s): "FREET3" Anemia work up: No results for input(s): "VITAMINB12", "FOLATE", "FERRITIN", "TIBC", "IRON", "RETICCTPCT" in the last 72 hours. Sepsis Labs: Recent Labs  Lab 10/11/22 1939 10/11/22 2307 10/13/22 0105  WBC 7.2  --  7.9  LATICACIDVEN  --  1.9  --     Microbiology Recent Results (from the past 240 hour(s))  Blood culture (routine x 2)     Status: None (Preliminary result)   Collection Time: 10/11/22 11:40 PM   Specimen: BLOOD  Result Value Ref Range Status   Specimen Description BLOOD LEFT ANTECUBITAL  Final   Special Requests   Final    BOTTLES DRAWN AEROBIC AND ANAEROBIC Blood Culture adequate volume   Culture   Final    NO GROWTH < 12 HOURS Performed at [facility], [address], [place], [place] [address]    Report Status PENDING  Incomplete  Blood culture (routine x 2)     Status: None (Preliminary result)   Collection Time: 10/11/22 11:41 PM   Specimen: BLOOD LEFT FOREARM  Result Value Ref Range Status   Specimen Description BLOOD LEFT FOREARM  Final   Special Requests   Final    BOTTLES DRAWN AEROBIC ONLY Blood Culture adequate volume    Culture   Final    NO GROWTH < 12 HOURS Performed at [facility], [address] [place], [place] [address]    Report Status PENDING  Incomplete  MRSA Next Gen by PCR, Nasal     Status: None   Collection Time: 10/12/22  3:00 AM  Result Value Ref Range Status   MRSA by PCR Next Gen NOT DETECTED NOT DETECTED Final    Comment: (NOTE) The GeneXpert MRSA Assay (FDA approved for NASAL specimens only), is one component of a comprehensive MRSA colonization surveillance program. It is not intended to diagnose MRSA infection nor to guide or monitor treatment for MRSA infections. Test performance is not FDA approved in patients less than 84 years old. Performed at [facility], [address], [place], [place] [address]   Respiratory (~20 pathogens) panel by PCR     Status: None   Collection Time: 10/12/22  3:01 AM   Specimen: Nasopharyngeal Swab; Respiratory  Result Value Ref Range Status   Adenovirus NOT DETECTED NOT DETECTED Final   Coronavirus 229E NOT DETECTED NOT DETECTED Final    Comment: (NOTE) The Coronavirus on the Respiratory Panel, DOES NOT test for the novel  Coronavirus (2019 nCoV)    Coronavirus HKU1 NOT DETECTED NOT DETECTED Final   Coronavirus NL63 NOT DETECTED  NOT DETECTED Final   Coronavirus OC43 NOT DETECTED NOT DETECTED Final   Metapneumovirus NOT DETECTED NOT DETECTED Final   Rhinovirus / Enterovirus NOT DETECTED NOT DETECTED Final   Influenza A NOT DETECTED NOT DETECTED Final   Influenza B NOT DETECTED NOT DETECTED Final   Parainfluenza Virus 1 NOT DETECTED NOT DETECTED Final   Parainfluenza Virus 2 NOT DETECTED NOT DETECTED Final   Parainfluenza Virus 3 NOT DETECTED NOT DETECTED Final   Parainfluenza Virus 4 NOT DETECTED NOT DETECTED Final   Respiratory Syncytial Virus NOT DETECTED NOT DETECTED Final   Bordetella pertussis NOT DETECTED NOT DETECTED Final   Bordetella Parapertussis NOT DETECTED NOT DETECTED Final   Chlamydophila pneumoniae NOT  DETECTED NOT DETECTED Final   Mycoplasma pneumoniae NOT DETECTED NOT DETECTED Final    Comment: Performed at [facility], [address] [place], [place] [address]    Procedures and diagnostic studies:  CT CHEST ABDOMEN PELVIS W CONTRAST  Result Date: 10/11/2022 CLINICAL DATA:  Sepsis, right flank swelling. EXAM: CT CHEST, ABDOMEN, AND PELVIS WITH CONTRAST TECHNIQUE: Multidetector CT imaging of the chest, abdomen and pelvis was performed following the standard protocol during bolus administration of intravenous contrast. RADIATION DOSE REDUCTION: This exam was performed according to the departmental dose-optimization program which includes automated exposure control, adjustment of the mA and/or kV according to patient size and/or use of iterative reconstruction technique. CONTRAST:  65mL OMNIPAQUE IOHEXOL 350 MG/ML SOLN COMPARISON:  CT abdomen and pelvis 10/11/2022. CT chest 09/05/2022. Lumbar spine MRI 08/02/2022. FINDINGS: CT CHEST FINDINGS Cardiovascular: The heart is mildly enlarged. There is no pericardial effusion. 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 Overall measurement at this level is 4.8 by 3.6 cm. This appears unchanged from prior. There are atherosclerotic calcifications of the aorta and coronary arteries. Mediastinum/Nodes: No enlarged mediastinal, hilar, or axillary lymph nodes. Thyroid gland, trachea, and esophagus demonstrate no significant findings. Lungs/Pleura: There are trace bilateral pleural effusions which have decreased from prior. There is new airspace consolidation in the right lower lobe. Additionally, patchy ground-glass opacities in the right lower lobe have increased. Ground-glass opacities in the lingula and left lower lobe have also slightly increased. Previously identified ground-glass and airspace opacities in the upper lobes have significantly decreased. Bronchiectasis persists in the left lower lobe.  There is no pneumothorax. Trachea and central airways are patent. Musculoskeletal: No chest wall mass or suspicious bone lesions identified. CT ABDOMEN PELVIS FINDINGS Hepatobiliary: No focal liver abnormality is seen. No gallstones, gallbladder wall thickening, or biliary dilatation. Pancreas: Unremarkable. No pancreatic ductal dilatation or surrounding inflammatory changes. Spleen: Normal in size without focal abnormality. Adrenals/Urinary Tract: Adrenal glands are unremarkable. Kidneys are normal, without renal calculi, focal lesion, or hydronephrosis. Bladder is unremarkable. Stomach/Bowel: There is gaseous distention of the sigmoid colon without definite twisting. There is no definite bowel obstruction. There is no focal wall thickening or inflammation. The appendix is not seen. Small bowel and stomach are within normal limits. Vascular/Lymphatic: Aorta and IVC are normal in size. Aortic/bi-iliac stent graft present. Adjacent to the anterior neck of the pancreas there is a 2.3 by 1.0 cm density which is unchanged, possibly an enlarged lymph node or exophytic lesion from the pancreas. No other enlarged lymph nodes are seen. Reproductive: Prostate is unremarkable. Other: No ascites.  Stable mild body wall edema. Musculoskeletal: The bones are osteopenic. Erosive changes are again seen along the endplates at L1-L2 corresponding to patient's osteomyelitis/discitis seen on  prior MRI. IMPRESSION: 1. New airspace disease in the right lower lobe concerning for pneumonia. 2. Additional ground-glass opacities in the right lower lobe, lingula and left lower lobe have increased. Findings may be infectious/inflammatory. 3. Trace bilateral pleural effusions have decreased from prior. 4. Gaseous distention of the sigmoid colon without definite evidence for volvulus or obstruction. 5. Mild body wall edema. 6. Stable focal aneurysm versus dissection of the proximal aortic arch measuring up to 4.8 cm Recommend semi-annual  imaging followup by CTA or MRA and referral to cardiothoracic surgery if not already obtained. This recommendation follows 2010 ACCF/AHA/AATS/ACR/ASA/SCA/SCAI/SIR/STS/SVM Guidelines for the Diagnosis and Management of Patients With Thoracic Aortic Disease. Circulation. 2010; 121: J191-Y78. Aortic aneurysm NOS (ICD10-I71.9) . 7. Stable density adjacent to the anterior neck of the pancreas, possibly an enlarged lymph node or exophytic lesion from the pancreas. 8. Stable erosive changes along the endplates at L1-L2 corresponding to patient's osteomyelitis/discitis seen on prior MRI. 9. Aortic atherosclerosis. Aortic Atherosclerosis (ICD10-I70.0). Electronically Signed   By: Darliss Cheney M.D.   On: 10/11/2022 22:45   CT Renal Stone Study  Result Date: 10/11/2022 CLINICAL DATA:  Abdominal pain, right flank pain EXAM: CT ABDOMEN AND PELVIS WITHOUT CONTRAST TECHNIQUE: Multidetector CT imaging of the abdomen and pelvis was performed following the standard protocol without IV contrast. RADIATION DOSE REDUCTION: This exam was performed according to the departmental dose-optimization program which includes automated exposure control, adjustment of the mA and/or kV according to patient size and/or use of iterative reconstruction technique. COMPARISON:  07/20/2022 FINDINGS: Lower chest: Bronchiectasis is seen in the lower lung fields. Patchy infiltrates are seen in the posterior lower lung fields, more so on the right side. Small right pleural effusion is seen. Minimal left pleural effusion is seen. Coronary artery calcifications are seen. Hepatobiliary: There is no dilation of bile ducts. There is 8 mm low-density in the liver, possibly cyst or hemangioma. There is subtle increased density in the dependent portion of gallbladder lumen suggesting sludge or tiny stones. There is no wall thickening in gallbladder. Pancreas: No focal abnormalities are seen. Spleen: Unremarkable. Adrenals/Urinary Tract: Adrenals are  unremarkable. There is no hydronephrosis. Vascular calcifications are seen in right renal artery branch in the upper pole. There are no definite demonstrable renal or ureteral stones. Urinary bladder is unremarkable. Stomach/Bowel: Stomach is not distended. Small bowel loops are not dilated. Appendix is not seen. There is gaseous distention of transverse colon. Moderate amount of stool is seen in right colon and rectosigmoid. Vascular/Lymphatic: Arterial calcifications are seen in aorta and its major branches. There is interval placement of aorto bi iliac stent. There is no retroperitoneal hematoma. Reproductive: Unremarkable. Other: There is no ascites or pneumoperitoneum. There is subcutaneous edema, possibly suggesting anasarca. Small bilateral inguinal hernias containing fat are seen. Musculoskeletal: Degenerative changes are noted in lumbar spine with encroachment of neural foramina at multiple levels. There is cortical irregularity in the endplates adjacent to L1-L2 disc space which was not evident in the previous CT. In previous MR lumbar spine done on 08/02/2022, there was evidence of discitis osteomyelitis at L1-L2 level. The endplates at L1-L2 level appear more irregular in comparison with the MRI. IMPRESSION: There is no evidence of intestinal obstruction or pneumoperitoneum. There is no hydronephrosis. There is gaseous distention of transverse colon suggesting ileus. There is cortical irregularity in the endplates at L1-L2 level suggesting discitis osteomyelitis. Irregularity in the bony endplates at L1-L2 level appear more prominent in comparison with MRI done on 08/02/2022. There is no demonstrable  paraspinal fluid collection. There are patchy infiltrates in both lower lung fields, more so on the right side suggesting atelectasis/pneumonia. Small bilateral pleural effusions, more so on the right side. Bronchiectasis is seen in the lower lung fields. Aortic arteriosclerosis. Coronary artery disease.  Aorto bi iliac stent is noted in place. There is subtle increased density in the dependent portion of gallbladder lumen suggesting possible sludge or tiny stones. There is no wall thickening or pericholecystic fluid collection. Electronically Signed   By: Ernie Avena M.D.   On: 10/11/2022 20:17               LOS: 1 day      [facility]   Pager on [email or web address]. If 7PM-7AM, please contact night-coverage at [email or web address]     10/13/2022, 2:41 PM

## 2022-10-13 NOTE — Plan of Care (Signed)
  Problem: Cardiovascular: Goal: Ability to achieve and maintain adequate cardiovascular perfusion will improve Outcome: Progressing   Problem: Education: Goal: Knowledge of General Education information will improve Description: Including pain rating scale, medication(s)/side effects and non-pharmacologic comfort measures Outcome: Progressing   Problem: Health Behavior/Discharge Planning: Goal: Ability to manage health-related needs will improve Outcome: Progressing   Problem: Clinical Measurements: Goal: Will remain free from infection Outcome: Progressing   Problem: Activity: Goal: Risk for activity intolerance will decrease Outcome: Progressing   Problem: Nutrition: Goal: Adequate nutrition will be maintained Outcome: Progressing   Problem: Coping: Goal: Level of anxiety will decrease Outcome: Progressing   Problem: Pain Managment: Goal: General experience of comfort will improve Outcome: Progressing   Problem: Safety: Goal: Ability to remain free from injury will improve Outcome: Progressing   Problem: Skin Integrity: Goal: Risk for impaired skin integrity will decrease Outcome: Progressing

## 2022-10-14 ENCOUNTER — Encounter: Payer: Medicare HMO | Admitting: Internal Medicine

## 2022-10-14 DIAGNOSIS — I739 Peripheral vascular disease, unspecified: Secondary | ICD-10-CM | POA: Diagnosis not present

## 2022-10-14 DIAGNOSIS — J189 Pneumonia, unspecified organism: Secondary | ICD-10-CM | POA: Diagnosis not present

## 2022-10-14 DIAGNOSIS — M4620 Osteomyelitis of vertebra, site unspecified: Secondary | ICD-10-CM | POA: Diagnosis not present

## 2022-10-14 DIAGNOSIS — D649 Anemia, unspecified: Secondary | ICD-10-CM

## 2022-10-14 DIAGNOSIS — I480 Paroxysmal atrial fibrillation: Secondary | ICD-10-CM | POA: Diagnosis not present

## 2022-10-14 LAB — BASIC METABOLIC PANEL WITH GFR
Anion gap: 10 (ref 5–15)
BUN: 40 mg/dL — ABNORMAL HIGH (ref 8–23)
CO2: 22 mmol/L (ref 22–32)
Calcium: 8.1 mg/dL — ABNORMAL LOW (ref 8.9–10.3)
Chloride: 99 mmol/L (ref 98–111)
Creatinine, Ser: 2.24 mg/dL — ABNORMAL HIGH (ref 0.61–1.24)
GFR, Estimated: 28 mL/min — ABNORMAL LOW
Glucose, Bld: 102 mg/dL — ABNORMAL HIGH (ref 70–99)
Potassium: 4.5 mmol/L (ref 3.5–5.1)
Sodium: 131 mmol/L — ABNORMAL LOW (ref 135–145)

## 2022-10-14 LAB — CBC
HCT: 24.9 % — ABNORMAL LOW (ref 39.0–52.0)
Hemoglobin: 7.9 g/dL — ABNORMAL LOW (ref 13.0–17.0)
MCH: 28.9 pg (ref 26.0–34.0)
MCHC: 31.7 g/dL (ref 30.0–36.0)
MCV: 91.2 fL (ref 80.0–100.0)
Platelets: 148 10*3/uL — ABNORMAL LOW (ref 150–400)
RBC: 2.73 MIL/uL — ABNORMAL LOW (ref 4.22–5.81)
RDW: 18.4 % — ABNORMAL HIGH (ref 11.5–15.5)
WBC: 10 10*3/uL (ref 4.0–10.5)
nRBC: 0 % (ref 0.0–0.2)

## 2022-10-14 LAB — RETICULOCYTES
Immature Retic Fract: 9.2 % (ref 2.3–15.9)
RBC.: 4.36 MIL/uL (ref 4.22–5.81)
Retic Count, Absolute: 56.7 10*3/uL (ref 19.0–186.0)
Retic Ct Pct: 1.3 % (ref 0.4–3.1)

## 2022-10-14 LAB — GLUCOSE, CAPILLARY
Glucose-Capillary: 94 mg/dL (ref 70–99)
Glucose-Capillary: 124 mg/dL — ABNORMAL HIGH (ref 70–99)
Glucose-Capillary: 106 mg/dL — ABNORMAL HIGH (ref 70–99)
Glucose-Capillary: 175 mg/dL — ABNORMAL HIGH (ref 70–99)

## 2022-10-14 LAB — IRON AND TIBC
Iron: 44 ug/dL — ABNORMAL LOW (ref 45–182)
Saturation Ratios: 20 % (ref 17.9–39.5)
TIBC: 223 ug/dL — ABNORMAL LOW (ref 250–450)
UIBC: 179 ug/dL

## 2022-10-14 LAB — FOLATE: Folate: 12.9 ng/mL

## 2022-10-14 LAB — VITAMIN B12: Vitamin B-12: 1595 pg/mL — ABNORMAL HIGH (ref 180–914)

## 2022-10-14 LAB — PREPARE RBC (CROSSMATCH)

## 2022-10-14 LAB — MAGNESIUM: Magnesium: 1.7 mg/dL (ref 1.7–2.4)

## 2022-10-14 LAB — FERRITIN: Ferritin: 981 ng/mL — ABNORMAL HIGH (ref 24–336)

## 2022-10-14 MED ORDER — SODIUM CHLORIDE 0.9% IV SOLUTION: Freq: Once | INTRAVENOUS | Status: AC

## 2022-10-14 NOTE — [place] FL2 (Signed)
[place] MEDICAID FL2 LEVEL OF CARE FORM     IDENTIFICATION  Patient Name: Brett Riley Birthdate: 1936/01/27 Sex: male Admission Date (Current Location): 10/11/2022  [facility] and [place] Number:  Producer, television/film/video and Address:  [facility], [address], [place], [place] [address]      Provider Number: [id number]  Attending Physician Name and Address:  Almon Hercules, MD  Relative Name and Phone Number:       Current Level of Care: Hospital Recommended Level of Care: Skilled Nursing Facility Prior Approval Number:    Date Approved/Denied:   PASRR Number: [id number]  Discharge Plan: SNF    Current Diagnoses: Patient Active Problem List   Diagnosis Date Noted   Chronic diastolic CHF (congestive heart failure) (HCC) 10/12/2022   History of CAD (coronary artery disease) 10/12/2022   CAP (community acquired pneumonia) 10/12/2022   CKD (chronic kidney disease), stage II 10/12/2022   Stage 3 chronic kidney disease (HCC) 08/27/2022   Status post thoracentesis 08/25/2022   Heart failure with preserved ejection fraction (HCC) 08/25/2022   Acute on chronic HFrEF (heart failure with reduced ejection fraction) (HCC) 08/24/2022   Pleural effusion on right 08/24/2022   Pleural effusion 08/23/2022   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   Sepsis (HCC) 08/21/2022   Paroxysmal atrial fibrillation (HCC) 08/20/2022   Acute respiratory failure with hypoxia (HCC) 08/18/2022   Myocardial injury 08/18/2022   Acute on chronic heart failure with preserved ejection fraction (HFpEF) (HCC) 08/18/2022   Normocytic anemia 08/18/2022   Atrial fibrillation, chronic (HCC) 08/18/2022   MSSA bacteremia 08/05/2022   Vertebral osteomyelitis continue doxycycline until 10/24/2022 (HCC) 08/05/2022   Acute osteomyelitis of lumbar spine (HCC) 08/03/2022   Metabolic acidosis 08/03/2022   Discitis of lumbar region 08/03/2022   Epidural abscess 08/03/2022   MSSA  (methicillin susceptible Staphylococcus aureus) septicemia (HCC) 08/02/2022   AKI (acute kidney injury) (HCC) 08/01/2022   Effusion of right knee 08/01/2022   Acute metabolic encephalopathy 08/01/2022   Hyponatremia 07/31/2022   Overweight (BMI 25.0-29.9) 07/31/2022   Persistent atrial fibrillation (HCC) 07/30/2022   Atrial fibrillation with rapid ventricular response (HCC) 07/23/2022   Peripheral vascular disease (HCC) 07/21/2022   Penetrating atherosclerotic ulcer of aorta (HCC) 07/20/2022   Uncontrolled hypertension 07/20/2022   Dyslipidemia 07/20/2022   Coronary artery disease 07/20/2022   Hypoxemia 07/20/2022   Scalp lesion 10/14/2021   Abnormal CT of the abdomen 04/09/2021   Bruising of the right flank 10/16/2020   Aortic atherosclerosis (HCC) 08/27/2020   Leg pain 01/10/2020   Fever 10/03/2019   Hypertension 06/01/2019   Aneurysm artery, 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   Dizziness 08/04/2016   Abdominal bruit 08/04/2016   Carotid stenosis, asymptomatic, left 02/15/2016   CAD (coronary artery disease)    Preop cardiovascular exam    Health care maintenance 09/11/2014   Hip region mass 12/12/2013   Soft tissue mass 11/28/2013   Fatigue 07/28/2013   Environmental allergies 07/28/2013   Carotid artery disease (HCC) 01/21/2013   Thrombocytopenia (HCC) 07/26/2012   Anemia 07/26/2012   Essential hypertension, benign 05/24/2012   Hyperlipidemia 05/24/2012    Orientation RESPIRATION BLADDER Height & Weight     Self, Time, Situation, Place  Normal Incontinent Weight: 184 lb 1.4 oz (83.5 kg) Height:  6' (182.9 cm)  BEHAVIORAL SYMPTOMS/MOOD NEUROLOGICAL BOWEL NUTRITION STATUS      Continent Diet (See Dc summary)  AMBULATORY STATUS COMMUNICATION OF NEEDS Skin   Extensive Assist Verbally Other (Comment) (  R flank Hematoma)                       Personal Care Assistance Level of Assistance  Bathing, Feeding, Dressing Bathing Assistance: Maximum assistance Feeding  assistance: Limited assistance Dressing Assistance: Maximum assistance     Functional Limitations Info  Sight, Hearing, Speech Sight Info: Adequate Hearing Info: Impaired Speech Info: Adequate    SPECIAL CARE FACTORS FREQUENCY  PT (By licensed PT), OT (By licensed OT)     PT Frequency: 5X week OT Frequency: 5X week            Contractures Contractures Info: Not present    Additional Factors Info  Code Status, Allergies Code Status Info: DNR Allergies Info: NKA           Current Medications (10/14/2022):  This is the current hospital active medication list Current Facility-Administered Medications  Medication Dose Route Frequency Provider Last Rate Last Admin   0.9 %  sodium chloride infusion (Manually program via Guardrails IV Fluids)   Intravenous Once Alanda Slim, Taye T, MD       0.9 %  sodium chloride infusion  250 mL Intravenous PRN Janalyn Shy, Subrina, MD       acetaminophen (TYLENOL) tablet 650 mg  650 mg Oral Q6H PRN Janalyn Shy, Subrina, MD       Or   acetaminophen (TYLENOL) suppository 650 mg  650 mg Rectal Q6H PRN Janalyn Shy, Subrina, MD       apixaban Everlene Balls) tablet 2.5 mg  2.5 mg Oral BID Sundil, Subrina, MD   2.5 mg at 10/14/22 0900   cefTRIAXone (ROCEPHIN) 1 g in sodium chloride 0.9 % 100 mL IVPB  1 g Intravenous Q24H Sundil, Subrina, MD 200 mL/hr at 10/14/22 1212 1 g at 10/14/22 1212   clopidogrel (PLAVIX) tablet 75 mg  75 mg Oral Daily Sundil, Subrina, MD   75 mg at 10/14/22 0900   diltiazem (CARDIZEM CD) 24 hr capsule 360 mg  360 mg Oral Daily Sundil, Subrina, MD   360 mg at 10/14/22 0900   doxycycline (VIBRA-TABS) tablet 100 mg  100 mg Oral Q12H Sundil, Subrina, MD   100 mg at 10/14/22 0900   feeding supplement (ENSURE ENLIVE / ENSURE PLUS) liquid 237 mL  237 mL Oral TID BM Sundil, Subrina, MD   237 mL at 10/12/22 0944   hydrALAZINE (APRESOLINE) injection 5 mg  5 mg Intravenous Q8H PRN Sundil, Subrina, MD       metoprolol succinate (TOPROL-XL) 24 hr tablet 100 mg  100  mg Oral Daily Sundil, Subrina, MD   100 mg at 10/14/22 0900   multivitamins with iron tablet 1 tablet  1 tablet Oral Daily Sundil, Subrina, MD   1 tablet at 10/14/22 0900   ondansetron (ZOFRAN) tablet 4 mg  4 mg Oral Q6H PRN Janalyn Shy, Subrina, MD       Or   ondansetron [facility]) injection 4 mg  4 mg Intravenous Q6H PRN Janalyn Shy, Subrina, MD       pantoprazole (PROTONIX) EC tablet 40 mg  40 mg Oral Daily Sundil, Subrina, MD   40 mg at 10/14/22 0900   polyethylene glycol (MIRALAX / GLYCOLAX) packet 17 g  17 g Oral Daily Sundil, Subrina, MD   17 g at 10/14/22 0900   predniSONE (DELTASONE) tablet 35 mg  35 mg Oral Q breakfast Sundil, Subrina, MD   35 mg at 10/14/22 0615   Followed by   Melene Muller ON 10/21/2022] predniSONE (DELTASONE) tablet 30  mg  30 mg Oral Q breakfast Janalyn Shy, Subrina, MD       Followed by   Melene Muller ON 10/28/2022] predniSONE (DELTASONE) tablet 25 mg  25 mg Oral Q breakfast Sundil, Subrina, MD       Followed by   Melene Muller ON 11/04/2022] predniSONE (DELTASONE) tablet 20 mg  20 mg Oral Q breakfast Sundil, Subrina, MD       Followed by   Melene Muller ON 11/11/2022] predniSONE (DELTASONE) tablet 15 mg  15 mg Oral Q breakfast Sundil, Subrina, MD       Followed by   Melene Muller ON 11/18/2022] predniSONE (DELTASONE) tablet 10 mg  10 mg Oral Q breakfast Sundil, Subrina, MD       Followed by   Melene Muller ON 11/25/2022] predniSONE (DELTASONE) tablet 5 mg  5 mg Oral Q breakfast Sundil, Subrina, MD       rosuvastatin (CRESTOR) tablet 10 mg  10 mg Oral QHS Sundil, Subrina, MD   10 mg at 10/13/22 2205   senna (SENOKOT) tablet 8.6 mg  1 tablet Oral Daily Sundil, Subrina, MD   8.6 mg at 10/14/22 0900   senna-docusate (Senokot-S) tablet 1 tablet  1 tablet Oral QHS PRN Janalyn Shy, Subrina, MD       sodium chloride flush (NS) 0.9 % injection 3 mL  3 mL Intravenous Q12H Sundil, Subrina, MD   3 mL at 10/14/22 0901   sodium chloride flush (NS) 0.9 % injection 3 mL  3 mL Intravenous PRN Janalyn Shy, Subrina, MD        sulfamethoxazole-trimethoprim (BACTRIM DS) 800-160 MG per tablet 1 tablet  1 tablet Oral Once per day on Monday Wednesday Friday Janalyn Shy, Subrina, MD   1 tablet at 10/14/22 0900     Discharge Medications: Please see discharge summary for a list of discharge medications.  Relevant Imaging Results:  Relevant Lab Results:   Additional Information SS# [id number] [place]

## 2022-10-14 NOTE — Consult Note (Signed)
   [facility] Inpatient Consult   10/14/2022  [name] 08-15-1935 [id number]  [facility] [[facility]]  Accountable Care Organization [ACO] Patient: Brett Riley insurance  Primary Care Provider:  Dale [place], MD with [place] at [facility] is listed to provide the transition of care follow up calls and appointments  Patient screened for less than 30 readmission hospitalizations with noted extreme high risk score for unplanned readmission risk  to assess for potential [facility]  [[facility]] Care Management service needs for post hospital transition for care coordination.  Review of patient's electronic medical record reveals patient is from SNF and set to return with insurance authorization required. Patient is with staff on rounds. Family at bedside..  Plan:  Continue to follow progress and disposition to assess for post hospital community care coordination/management needs.  Referral request for community care coordination: pending disposition needs.  If return to a [facility] affiliated facility will alert [facility] RN for follow up.   Of note, [facility] Care Management/Population Health does not replace or interfere with any arrangements made by the Inpatient Transition of Care team.  For questions contact:   Brett Shanks, RN BSN CCM [facility]  [phone] business mobile phone Toll free office [phone]  *Concierge Line  [phone] Fax number: [phone] [email or web address]

## 2022-10-14 NOTE — TOC Initial Note (Signed)
Transition of Care [facility]) - Initial/Assessment Note    Patient Details  Name: Brett Riley MRN: [id number] Date of Birth: [birth year]  Transition of Care [facility]) CM/SW Contact:    Carley Hammed, LCSW Phone Number: 10/14/2022, 1:52 PM  Clinical Narrative:                 CSW was advised pt is from [facility] with recommendations to return at discharge. CSW met with pt at bedside and he confirmed this information. He is agreeable to ambulance transport and to CSW speaking with his spouse. CSW left VM for spouse and coordinated with [facility] who will be able to accept pt back when medically ready. Authorization started and pending at this time. TOC will continue to follow for DC needs.   Expected Discharge Plan: Skilled Nursing Facility Barriers to Discharge: Insurance Authorization, Continued Medical Work up   Patient Goals and CMS Choice Patient states their goals for this hospitalization and ongoing recovery are:: Pt would like to return to [facility] to recieve more PT. CMS Medicare.gov Compare Post Acute Care list provided to:: Patient Choice offered to / list presented to : Patient      Expected Discharge Plan and Services     Post Acute Care Choice: Skilled Nursing Facility Living arrangements for the past 2 months: Single Family Home                                      Prior Living Arrangements/Services Living arrangements for the past 2 months: Single Family Home Lives with:: Spouse Patient language and need for interpreter reviewed:: Yes Do you feel safe going back to the place where you live?: Yes      Need for Family Participation in Patient Care: Yes (Comment) Care giver support system in place?: Yes (comment)   Criminal Activity/Legal Involvement Pertinent to Current Situation/Hospitalization: No - Comment as needed  Activities of Daily Living      Permission Sought/Granted Permission sought to share information with : Family Supports, Industrial/product designer granted to share information with : Yes, Verbal Permission Granted  Share Information with NAME: Gershon Cull  Permission granted to share info w AGENCY: [employer]  Permission granted to share info w Relationship: Spouse     Emotional Assessment Appearance:: Appears stated age Attitude/Demeanor/Rapport: Engaged Affect (typically observed): Appropriate Orientation: : Oriented to Self, Oriented to Place, Oriented to  Time, Oriented to Situation Alcohol / Substance Use: Not Applicable Psych Involvement: No (comment)  Admission diagnosis:  CAP (community acquired pneumonia) [J18.9] HCAP (healthcare-associated pneumonia) [J18.9] Patient Active Problem List   Diagnosis Date Noted   Chronic diastolic CHF (congestive heart failure) (HCC) 10/12/2022   History of CAD (coronary artery disease) 10/12/2022   CAP (community acquired pneumonia) 10/12/2022   CKD (chronic kidney disease), stage II 10/12/2022   Stage 3 chronic kidney disease (HCC) 08/27/2022   Status post thoracentesis 08/25/2022   Heart failure with preserved ejection fraction (HCC) 08/25/2022   Acute on chronic HFrEF (heart failure with reduced ejection fraction) (HCC) 08/24/2022   Pleural effusion on right 08/24/2022   Pleural effusion 08/23/2022   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   Sepsis (HCC) 08/21/2022   Paroxysmal atrial fibrillation (HCC) 08/20/2022   Acute respiratory failure with hypoxia (HCC) 08/18/2022   Myocardial injury 08/18/2022   Acute on chronic heart failure with preserved ejection fraction (HFpEF) (HCC) 08/18/2022   Normocytic anemia 08/18/2022  Atrial fibrillation, chronic (HCC) 08/18/2022   MSSA bacteremia 08/05/2022   Vertebral osteomyelitis continue doxycycline until 10/24/2022 (HCC) 08/05/2022   Acute osteomyelitis of lumbar spine (HCC) 08/03/2022   Metabolic acidosis 08/03/2022   Discitis of lumbar region 08/03/2022   Epidural abscess 08/03/2022   MSSA  (methicillin susceptible Staphylococcus aureus) septicemia (HCC) 08/02/2022   AKI (acute kidney injury) (HCC) 08/01/2022   Effusion of right knee 08/01/2022   Acute metabolic encephalopathy 08/01/2022   Hyponatremia 07/31/2022   Overweight (BMI 25.0-29.9) 07/31/2022   Persistent atrial fibrillation (HCC) 07/30/2022   Atrial fibrillation with rapid ventricular response (HCC) 07/23/2022   Peripheral vascular disease (HCC) 07/21/2022   Penetrating atherosclerotic ulcer of aorta (HCC) 07/20/2022   Uncontrolled hypertension 07/20/2022   Dyslipidemia 07/20/2022   Coronary artery disease 07/20/2022   Hypoxemia 07/20/2022   Scalp lesion 10/14/2021   Abnormal CT of the abdomen 04/09/2021   Bruising of the right flank 10/16/2020   Aortic atherosclerosis (HCC) 08/27/2020   Leg pain 01/10/2020   Fever 10/03/2019   Hypertension 06/01/2019   Aneurysm artery, 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   Dizziness 08/04/2016   Abdominal bruit 08/04/2016   Carotid stenosis, asymptomatic, left 02/15/2016   CAD (coronary artery disease)    Preop cardiovascular exam    Health care maintenance 09/11/2014   Hip region mass 12/12/2013   Soft tissue mass 11/28/2013   Fatigue 07/28/2013   Environmental allergies 07/28/2013   Carotid artery disease (HCC) 01/21/2013   Thrombocytopenia (HCC) 07/26/2012   Anemia 07/26/2012   Essential hypertension, benign 05/24/2012   Hyperlipidemia 05/24/2012   PCP:  Dale Tunica, MD Pharmacy:   [facility] [address], [place] - [address] [place] [address] Phone: [phone] Fax: [phone]  [id number] [place], [place] - [address] [place] [address] Phone: [phone] Fax: [phone]     Social Determinants of Health (SDOH) Social History: SDOH Screenings   Food Insecurity: No Food Insecurity (08/18/2022)  Housing: Low Risk  (08/18/2022)  Transportation Needs: No Transportation Needs (08/18/2022)  Utilities: Not At  Risk (08/18/2022)  Depression (PHQ2-9): Low Risk  (06/06/2022)  Financial Resource Strain: Low Risk  (08/29/2020)  Physical Activity: Insufficiently Active (08/29/2020)  Social Connections: Unknown (08/29/2020)  Stress: No Stress Concern Present (08/29/2020)  Tobacco Use: Low Risk  (10/12/2022)   SDOH Interventions:     Readmission Risk Interventions     No data to display

## 2022-10-14 NOTE — Progress Notes (Signed)
Physical Therapy Treatment Patient Details Name: Brett Riley MRN: [id number] DOB: 1935/10/23 Today's Date: 10/14/2022   History of Present Illness Pt is an 87 y.o. male who presented 10/11/22 with L flank ecchymosis. CT abdomen pelvis and CT stone chaser did not showed any internal hematoma and retroperitoneal hematoma. Pt found to have community-acquired pneumonia. Imaging also suggesting L1-L2 level discitis osteomyelitis. Of note, recently admitted to [facility] 6/16 - 7/24 with respiratory failure and AKI and discharged to SNF. PMH: MI, CHF, a-fib, HTN, anemia, cancer, celiac artery stenosis, carotid artery disease, CAD    PT Comments  Pt received in supine and agreeable to session. Pt demonstrating improved bed mobility, requiring up to mod A. Pt able to stand with the stedy and mod A +2 from an elevated surface after an unsuccessful attempt from a lower surface. Pt demonstrating a posterior bias in standing and difficulty reaching a full upright posture due to weakness and lack of full B knee extension. Pt with limited standing tolerance due to fatigue and requiring frequent rest breaks during mobility tasks. Pt continues to benefit from PT services to progress toward functional mobility goals.    If plan is discharge home, recommend the following: Assistance with cooking/housework;Assist for transportation;Two people to help with walking and/or transfers;Two people to help with bathing/dressing/bathroom;Help with stairs or ramp for entrance   Can travel by private vehicle     No  Equipment Recommendations  Hoyer lift;Hospital bed;Wheelchair (measurements PT);Wheelchair cushion (measurements PT);BSC/3in1;Rolling walker (2 wheels)    Recommendations for Other Services       Precautions / Restrictions Precautions Precautions: Fall;Other (comment) Precaution Comments: watch SpO2 Restrictions Weight Bearing Restrictions: No     Mobility  Bed Mobility Overal bed mobility: Needs  Assistance Bed Mobility: Supine to Sit     Supine to sit: HOB elevated, Used rails, Mod assist     General bed mobility comments: Mod A for trunk elevation and dense cues for sequencing. Multiple rest breaks throughout.    Transfers Overall transfer level: Needs assistance Equipment used: Ambulation equipment used Transfers: Bed to chair/wheelchair/BSC, Sit to/from Stand Sit to Stand: Max assist, +2 physical assistance, Mod assist, From elevated surface           General transfer comment: STS from elevated EOB with mod A +2 after an unsuccessful attempt from lower surface with max A +2. Pt standing from stedy paddles and is unable to fully extend knees to reach an upright posture. Pt demonstrating posterior bias in standing. Transfer via Lift Equipment: Stedy  Ambulation/Gait               General Gait Details: unable       Balance Overall balance assessment: Needs assistance Sitting-balance support: Bilateral upper extremity supported, Feet supported Sitting balance-Leahy Scale: Poor Sitting balance - Comments: Pt with strong posterior bias sitting EOB requiring dense cues for hand placement and up to mod A. Pt able to maintain balance with min guard with one hand on chair arm and the other on the bed.   Standing balance support: Bilateral upper extremity supported, During functional activity, Reliant on assistive device for balance Standing balance-Leahy Scale: Poor Standing balance comment: with stedy support                            Cognition Arousal: Alert Behavior During Therapy: WFL for tasks assessed/performed Overall Cognitive Status: Within Functional Limits for tasks assessed  Exercises      General Comments General comments (skin integrity, edema, etc.): Pt on RA throughout session. SpO2 with unstable pleth throughout, however in the 90's with improved readings.       Pertinent Vitals/Pain Pain Assessment Pain Assessment: No/denies pain     PT Goals (current goals can now be found in the care plan section) Acute Rehab PT Goals Patient Stated Goal: get stronger and return to being independent PT Goal Formulation: With patient/family Time For Goal Achievement: 10/26/22 Potential to Achieve Goals: Fair Progress towards PT goals: Progressing toward goals    Frequency    Min 1X/week      PT Plan Current plan remains appropriate       AM-PAC PT "6 Clicks" Mobility   Outcome Measure  Help needed turning from your back to your side while in a flat bed without using bedrails?: A Little Help needed moving from lying on your back to sitting on the side of a flat bed without using bedrails?: A Lot Help needed moving to and from a bed to a chair (including a wheelchair)?: Total Help needed standing up from a chair using your arms (e.g., wheelchair or bedside chair)?: Total Help needed to walk in hospital room?: Total Help needed climbing 3-5 steps with a railing? : Total 6 Click Score: 9    End of Session Equipment Utilized During Treatment: Gait belt Activity Tolerance: Patient limited by fatigue Patient left: in chair;with call bell/phone within reach;with chair alarm set Nurse Communication: Mobility status;Need for lift equipment;Other (comment) (O2 sats) PT Visit Diagnosis: Muscle weakness (generalized) (M62.81);Difficulty in walking, not elsewhere classified (R26.2);Unsteadiness on feet (R26.81);Other abnormalities of gait and mobility (R26.89)     Time: 0347-4259 PT Time Calculation (min) (ACUTE ONLY): 24 min  Charges:    $Therapeutic Exercise: 23-37 mins PT General Charges $$ ACUTE PT VISIT: 1 Visit                     Johny Shock, PTA Acute Rehabilitation Services Secure Chat Preferred  [phone]    Johny Shock 10/14/2022, 11:10 AM

## 2022-10-14 NOTE — Progress Notes (Signed)
PROGRESS NOTE  Brett Riley [name] DOB: 1935/10/12   PCP: Dale Louise, MD  Patient is from: SNF  DOA: 10/11/2022 LOS: 2  Chief complaints Chief Complaint  Patient presents with   Flank Pain     Brief Narrative / Interim history: 87 year old M with PMH of diastolic CHF, chronic hypoxic RF on 2 L, CAD, PAF on Eliquis, HTN, anemia, thrombocytopenia and recent hospitalization from 6/16-7/24 for MSSA bacteremia with vertebral osteomyelitis, AKI and respiratory failure presenting from SNF with left flank ecchymosis and admitted for bilateral pneumonia and right flank bruising/ecchymosis.  Patient is on doxycycline for MSSA bacteremia/osteomyelitis.  Initially started on cefepime vancomycin for pneumonia that was later de-escalated to IV ceftriaxone.  Also received IV Lasix for possible acute diastolic CHF.  Unfortunately, he developed AKI.  Diuretics discontinued.    Subjective: Seen and examined earlier this morning.  No major events overnight of this morning.  Has no complaints other than feeling fatigued and tired.  Working on incentive spirometry.  He denies chest pain, shortness of breath, cough, GI or UTI symptoms.  Objective: Vitals:   10/14/22 0555 10/14/22 0752 10/14/22 0859 10/14/22 1110  BP: (!) 144/84 124/74  120/67  Pulse: 74 77 81 80  Resp: (!) 22 20  20   Temp: 98 F (36.7 C) 98.1 F (36.7 C)  98 F (36.7 C)  TempSrc: Oral Oral  Oral  SpO2: 96% 96%  96%  Weight: 83.5 kg     Height:        Examination:  GENERAL: No apparent distress.  Nontoxic. HEENT: MMM.  Vision and hearing grossly intact.  NECK: Supple.  No apparent JVD.  RESP:  No IWOB.  Fair aeration bilaterally. CVS:  RRR. Heart sounds normal.  ABD/GI/GU: BS+. Abd soft, NTND.  MSK/EXT:  Moves extremities. No apparent deformity.  Trace BLE edema. SKIN: no apparent skin lesion or wound NEURO: Awake, alert and oriented appropriately.  No apparent focal neuro deficit. PSYCH: Calm. Normal affect.    Procedures:  None  Microbiology summarized: 8/10-MRSA PCR screen nonreactive 8/10-full RVP nonreactive 8/9-blood cultures NGTD  Assessment and plan: Principal Problem:   CAP (community acquired pneumonia) Active Problems:   Bruising of the right flank   Vertebral osteomyelitis continue doxycycline until 10/24/2022 [facility])   Aortic atherosclerosis (HCC)   Peripheral vascular disease (HCC)   Paroxysmal atrial fibrillation (HCC)   Chronic diastolic CHF (congestive heart failure) (HCC)   History of CAD (coronary artery disease)   CKD (chronic kidney disease), stage II   Community-acquired pneumonia/bilateral lower lobe pneumonia: Full RVP, blood cultures and MRSA screen negative.  On room air.  Currently without respiratory distress. -Initially received vancomycin and cefepime and de-escalated to IV ceftriaxone. -Already on doxycycline for MSSA bacteremia/osteomyelitis. -Continue Bactrim for PJP prophylaxis since patient is on significant dose of prednisone for possible vasculitis until 11/25/2022. -Outpatient follow-up with pulmonology +/- rheumatology. -Encourage incentive telemetry.  Good effort.  Chronic hypoxic respiratory failure: Currently on room air. Suspected systemic pulmonary disease/vasculitis: Had extensive workup in 08/2022.  Autoimmune workup negative except for elevated ANA, IgA and ribonucleoprotein.  Was discharged on prednisone taper and Bactrim for PJP prophylaxis last month. -Continue prednisone taper until 11/25/2022 as previously planned. -Continue p.o. Bactrim for PJP prophylaxis. -Continue incentive spirometry   Right-sided flank bruising and ecchymosis: No evidence of retroperitoneal hematoma on CT abdomen and pelvis.  -Continue monitoring     AKI: His Cr was 0.9-1.0 in the of May 2024.  His Cr was up and  down since beginning of June but never been down to baseline.  It seems he has contrast with CT on admission.  He also received some Lasix due to leg  swelling. Recent Labs    09/19/22 0630 09/20/22 0914 09/21/22 0521 09/22/22 0629 09/23/22 0446 09/24/22 0444 09/25/22 0604 10/11/22 1939 10/13/22 0105 10/14/22 0051  BUN 96* 99* 102* 98* 102* 100* 92* 32* 34* 40*  CREATININE 3.93* 4.25* 4.38* 4.31* 4.12* 3.71* 3.39* 1.62* 1.70* 2.24*  -Hold diuretics.  Does not look fluid overloaded.  BLE edema likely from spending most of his time in wheelchair. -Avoid nephrotoxic meds. -Hold off IV fluid since he is getting 1 unit of blood. -Continue monitoring -Strict intake and output  Symptomatic anemia: H&H relatively stable.  Feels fatigued and tired Recent Labs    09/18/22 1436 09/19/22 0630 09/19/22 1659 09/20/22 0914 09/22/22 0629 09/25/22 0604 10/11/22 1939 10/12/22 2221 10/13/22 0105 10/14/22 0051  HGB 8.0* 8.3* 8.2* 8.5* 8.1* 9.6* 8.7* 8.1* 8.2* 7.9*  -Transfuse 1 unit.  Verbally consented -Check anemia panel. -Continue monitoring  Vertebral osteomyelitis (continue oral doxycycline until 10/24/2022) -Continue doxycycline per recommendation by ID until 10/24/2022.      Paroxysmal atrial fibrillation: Rate controlled.  CHA2DS2-VASc score = 4 -Continue Eliquis and Cardizem and Toprol-XL. -Optimize electrolytes.     Chronic HFpEF: Has edema likely postural.  Appears euvolemic on exam.  Lung exam clear.  No respiratory distress. -Hold off further diuretics given AKI. -Strict intake and output, daily weight, renal functions and electrolytes     History of CAD/PAD: Stable. -Continue Plavix Toprol and Crestor    Age-related frailty/physical deconditioning: -Continue PT/OT   Body mass index is 24.97 kg/m.          DVT prophylaxis:  apixaban (ELIQUIS) tablet 2.5 mg Start: 10/12/22 1000 SCDs Start: 10/12/22 0301 Place TED hose Start: 10/12/22 0301 apixaban (ELIQUIS) tablet 2.5 mg  Code Status: Full code Family Communication: None at the bedside Level of care: Telemetry Cardiac Status is: Inpatient Remains  inpatient appropriate because: Pneumonia, AKI and anemia   Final disposition: SNF Consultants:  None  55 minutes with more than 50% spent in reviewing records, counseling patient/family and coordinating care.   Sch Meds:  Scheduled Meds:  sodium chloride   Intravenous Once   apixaban  2.5 mg Oral BID   clopidogrel  75 mg Oral Daily   diltiazem  360 mg Oral Daily   doxycycline  100 mg Oral Q12H   feeding supplement  237 mL Oral TID BM   metoprolol succinate  100 mg Oral Daily   multivitamins with iron  1 tablet Oral Daily   pantoprazole  40 mg Oral Daily   polyethylene glycol  17 g Oral Daily   predniSONE  35 mg Oral Q breakfast   Followed by   Melene Muller ON 10/21/2022] predniSONE  30 mg Oral Q breakfast   Followed by   Melene Muller ON 10/28/2022] predniSONE  25 mg Oral Q breakfast   Followed by   Melene Muller ON 11/04/2022] predniSONE  20 mg Oral Q breakfast   Followed by   Melene Muller ON 11/11/2022] predniSONE  15 mg Oral Q breakfast   Followed by   Melene Muller ON 11/18/2022] predniSONE  10 mg Oral Q breakfast   Followed by   Melene Muller ON 11/25/2022] predniSONE  5 mg Oral Q breakfast   rosuvastatin  10 mg Oral QHS   senna  1 tablet Oral Daily   sodium chloride flush  3 mL Intravenous Q12H  sulfamethoxazole-trimethoprim  1 tablet Oral Once per day on Monday Wednesday Friday   Continuous Infusions:  sodium chloride     cefTRIAXone (ROCEPHIN)  IV 1 g (10/13/22 1107)   PRN Meds:.sodium chloride, acetaminophen **OR** acetaminophen, hydrALAZINE, ondansetron **OR** ondansetron (ZOFRAN) IV, senna-docusate, sodium chloride flush  Antimicrobials: Anti-infectives (From admission, onward)    Start     Dose/Rate Route Frequency Ordered Stop   10/14/22 1000  sulfamethoxazole-trimethoprim (BACTRIM DS) 800-160 MG per tablet 1 tablet        1 tablet Oral Once per day on Monday Wednesday Friday 10/12/22 0300     10/12/22 1000  doxycycline (VIBRA-TABS) tablet 100 mg        100 mg Oral Every 12 hours 10/12/22 0300  10/24/22 2359   10/12/22 1000  cefTRIAXone (ROCEPHIN) 1 g in sodium chloride 0.9 % 100 mL IVPB        1 g 200 mL/hr over 30 Minutes Intravenous Every 24 hours 10/12/22 0300     10/11/22 2345  ceFEPIme (MAXIPIME) 2 g in sodium chloride 0.9 % 100 mL IVPB        2 g 200 mL/hr over 30 Minutes Intravenous  Once 10/11/22 2336 10/12/22 0025   10/11/22 2330  vancomycin (VANCOREADY) IVPB 1500 mg/300 mL        1,500 mg 150 mL/hr over 120 Minutes Intravenous  Once 10/11/22 2311 10/12/22 0301   10/11/22 2315  vancomycin (VANCOCIN) IVPB 1000 mg/200 mL premix  Status:  Discontinued        1,000 mg 200 mL/hr over 60 Minutes Intravenous  Once 10/11/22 2306 10/11/22 2311        I have personally reviewed the following labs and images: CBC: Recent Labs  Lab 10/11/22 1939 10/12/22 2221 10/13/22 0105 10/14/22 0051  WBC 7.2  --  7.9 10.0  NEUTROABS 6.2  --   --   --   HGB 8.7* 8.1* 8.2* 7.9*  HCT 27.8* 25.5* 26.6* 24.9*  MCV 94.2  --  95.7 91.2  PLT 135*  --  134* 148*   BMP &GFR Recent Labs  Lab 10/11/22 1939 10/13/22 0105 10/14/22 0051  NA 137 136 131*  K 4.2 4.5 4.5  CL 101 101 99  CO2 22 22 22   GLUCOSE 125* 100* 102*  BUN 32* 34* 40*  CREATININE 1.62* 1.70* 2.24*  CALCIUM 8.5* 8.2* 8.1*  MG  --   --  1.7   Estimated Creatinine Clearance: 26.9 mL/min (A) (by C-G formula based on SCr of 2.24 mg/dL (H)). Liver & Pancreas: Recent Labs  Lab 10/11/22 1939 10/13/22 0105  AST 30 23  ALT 43 30  ALKPHOS 72 68  BILITOT 0.2* 0.5  PROT 5.8* 5.5*  ALBUMIN 2.7* 2.5*   No results for input(s): "LIPASE", "AMYLASE" in the last 168 hours. No results for input(s): "AMMONIA" in the last 168 hours. Diabetic: No results for input(s): "HGBA1C" in the last 72 hours. Recent Labs  Lab 10/13/22 0720 10/13/22 1121 10/13/22 1628 10/14/22 0553 10/14/22 1112  GLUCAP 73 157* 103* 94 124*   Cardiac Enzymes: Recent Labs  Lab 10/11/22 1939  CKTOTAL 31*   No results for input(s): "PROBNP"  in the last 8760 hours. Coagulation Profile: No results for input(s): "INR", "PROTIME" in the last 168 hours. Thyroid Function Tests: No results for input(s): "TSH", "T4TOTAL", "FREET4", "T3FREE", "THYROIDAB" in the last 72 hours. Lipid Profile: No results for input(s): "CHOL", "HDL", "LDLCALC", "TRIG", "CHOLHDL", "LDLDIRECT" in the last 72 hours. Anemia Panel:  No results for input(s): "VITAMINB12", "FOLATE", "FERRITIN", "TIBC", "IRON", "RETICCTPCT" in the last 72 hours. Urine analysis:    Component Value Date/Time   COLORURINE YELLOW 10/12/2022 0010   APPEARANCEUR CLEAR 10/12/2022 0010   LABSPEC 1.020 10/12/2022 0010   PHURINE 7.0 10/12/2022 0010   GLUCOSEU NEGATIVE 10/12/2022 0010   HGBUR MODERATE (A) 10/12/2022 0010   BILIRUBINUR NEGATIVE 10/12/2022 0010   KETONESUR NEGATIVE 10/12/2022 0010   PROTEINUR 30 (A) 10/12/2022 0010   NITRITE NEGATIVE 10/12/2022 0010   LEUKOCYTESUR NEGATIVE 10/12/2022 0010   Sepsis Labs: Invalid input(s): "PROCALCITONIN", "LACTICIDVEN"  Microbiology: Recent Results (from the past 240 hour(s))  Blood culture (routine x 2)     Status: None (Preliminary result)   Collection Time: 10/11/22 11:40 PM   Specimen: BLOOD  Result Value Ref Range Status   Specimen Description BLOOD LEFT ANTECUBITAL  Final   Special Requests   Final    BOTTLES DRAWN AEROBIC AND ANAEROBIC Blood Culture adequate volume   Culture   Final    NO GROWTH 3 DAYS Performed at [facility], [address] [place], [place] [address]    Report Status PENDING  Incomplete  Blood culture (routine x 2)     Status: None (Preliminary result)   Collection Time: 10/11/22 11:41 PM   Specimen: BLOOD LEFT FOREARM  Result Value Ref Range Status   Specimen Description BLOOD LEFT FOREARM  Final   Special Requests   Final    BOTTLES DRAWN AEROBIC ONLY Blood Culture adequate volume   Culture   Final    NO GROWTH 3 DAYS Performed at [facility], [address], [place], [place]  [address]    Report Status PENDING  Incomplete  MRSA Next Gen by PCR, Nasal     Status: None   Collection Time: 10/12/22  3:00 AM  Result Value Ref Range Status   MRSA by PCR Next Gen NOT DETECTED NOT DETECTED Final    Comment: (NOTE) The GeneXpert MRSA Assay (FDA approved for NASAL specimens only), is one component of a comprehensive MRSA colonization surveillance program. It is not intended to diagnose MRSA infection nor to guide or monitor treatment for MRSA infections. Test performance is not FDA approved in patients less than 54 years old. Performed at [facility], [address], [place], [place] [address]   Respiratory (~20 pathogens) panel by PCR     Status: None   Collection Time: 10/12/22  3:01 AM   Specimen: Nasopharyngeal Swab; Respiratory  Result Value Ref Range Status   Adenovirus NOT DETECTED NOT DETECTED Final   Coronavirus 229E NOT DETECTED NOT DETECTED Final    Comment: (NOTE) The Coronavirus on the Respiratory Panel, DOES NOT test for the novel  Coronavirus (2019 nCoV)    Coronavirus HKU1 NOT DETECTED NOT DETECTED Final   Coronavirus NL63 NOT DETECTED NOT DETECTED Final   Coronavirus OC43 NOT DETECTED NOT DETECTED Final   Metapneumovirus NOT DETECTED NOT DETECTED Final   Rhinovirus / Enterovirus NOT DETECTED NOT DETECTED Final   Influenza A NOT DETECTED NOT DETECTED Final   Influenza B NOT DETECTED NOT DETECTED Final   Parainfluenza Virus 1 NOT DETECTED NOT DETECTED Final   Parainfluenza Virus 2 NOT DETECTED NOT DETECTED Final   Parainfluenza Virus 3 NOT DETECTED NOT DETECTED Final   Parainfluenza Virus 4 NOT DETECTED NOT DETECTED Final   Respiratory Syncytial Virus NOT DETECTED NOT DETECTED Final   Bordetella pertussis NOT DETECTED NOT DETECTED Final  Bordetella Parapertussis NOT DETECTED NOT DETECTED Final   Chlamydophila pneumoniae NOT DETECTED NOT DETECTED Final   Mycoplasma pneumoniae NOT DETECTED NOT DETECTED Final    Comment: Performed at [facility], [address] [place], [place] [address]    Radiology Studies: No results found.     T.  [facility]  If 7PM-7AM, please contact night-coverage [email or web address] 10/14/2022, 11:46 AM

## 2022-10-14 NOTE — Plan of Care (Signed)
  Problem: Activity: Goal: Risk for activity intolerance will decrease Outcome: Progressing   Problem: Nutrition: Goal: Adequate nutrition will be maintained Outcome: Progressing   Problem: Coping: Goal: Level of anxiety will decrease Outcome: Progressing   Problem: Elimination: Goal: Will not experience complications related to bowel motility Outcome: Progressing   

## 2022-10-15 DIAGNOSIS — Z79899 Other long term (current) drug therapy: Secondary | ICD-10-CM | POA: Diagnosis not present

## 2022-10-15 DIAGNOSIS — R2681 Unsteadiness on feet: Secondary | ICD-10-CM | POA: Diagnosis not present

## 2022-10-15 DIAGNOSIS — D649 Anemia, unspecified: Secondary | ICD-10-CM | POA: Diagnosis not present

## 2022-10-15 DIAGNOSIS — R531 Weakness: Secondary | ICD-10-CM | POA: Diagnosis not present

## 2022-10-15 DIAGNOSIS — J90 Pleural effusion, not elsewhere classified: Secondary | ICD-10-CM | POA: Diagnosis not present

## 2022-10-15 DIAGNOSIS — G4700 Insomnia, unspecified: Secondary | ICD-10-CM | POA: Diagnosis not present

## 2022-10-15 DIAGNOSIS — I482 Chronic atrial fibrillation, unspecified: Secondary | ICD-10-CM | POA: Diagnosis not present

## 2022-10-15 DIAGNOSIS — Z8679 Personal history of other diseases of the circulatory system: Secondary | ICD-10-CM | POA: Diagnosis not present

## 2022-10-15 DIAGNOSIS — I10 Essential (primary) hypertension: Secondary | ICD-10-CM | POA: Diagnosis not present

## 2022-10-15 DIAGNOSIS — N182 Chronic kidney disease, stage 2 (mild): Secondary | ICD-10-CM | POA: Diagnosis not present

## 2022-10-15 DIAGNOSIS — I5030 Unspecified diastolic (congestive) heart failure: Secondary | ICD-10-CM | POA: Diagnosis not present

## 2022-10-15 DIAGNOSIS — E875 Hyperkalemia: Secondary | ICD-10-CM | POA: Diagnosis not present

## 2022-10-15 DIAGNOSIS — K5900 Constipation, unspecified: Secondary | ICD-10-CM | POA: Diagnosis not present

## 2022-10-15 DIAGNOSIS — T148XXA Other injury of unspecified body region, initial encounter: Secondary | ICD-10-CM | POA: Diagnosis not present

## 2022-10-15 DIAGNOSIS — N183 Chronic kidney disease, stage 3 unspecified: Secondary | ICD-10-CM | POA: Diagnosis not present

## 2022-10-15 DIAGNOSIS — I739 Peripheral vascular disease, unspecified: Secondary | ICD-10-CM | POA: Diagnosis not present

## 2022-10-15 DIAGNOSIS — Z7401 Bed confinement status: Secondary | ICD-10-CM | POA: Diagnosis not present

## 2022-10-15 DIAGNOSIS — G9341 Metabolic encephalopathy: Secondary | ICD-10-CM | POA: Diagnosis not present

## 2022-10-15 DIAGNOSIS — Z7689 Persons encountering health services in other specified circumstances: Secondary | ICD-10-CM | POA: Diagnosis not present

## 2022-10-15 DIAGNOSIS — N179 Acute kidney failure, unspecified: Secondary | ICD-10-CM | POA: Diagnosis not present

## 2022-10-15 DIAGNOSIS — J189 Pneumonia, unspecified organism: Secondary | ICD-10-CM | POA: Diagnosis not present

## 2022-10-15 DIAGNOSIS — M4620 Osteomyelitis of vertebra, site unspecified: Secondary | ICD-10-CM | POA: Diagnosis not present

## 2022-10-15 DIAGNOSIS — I2584 Coronary atherosclerosis due to calcified coronary lesion: Secondary | ICD-10-CM | POA: Diagnosis not present

## 2022-10-15 DIAGNOSIS — J9601 Acute respiratory failure with hypoxia: Secondary | ICD-10-CM | POA: Diagnosis not present

## 2022-10-15 DIAGNOSIS — I480 Paroxysmal atrial fibrillation: Secondary | ICD-10-CM | POA: Diagnosis not present

## 2022-10-15 DIAGNOSIS — E872 Acidosis, unspecified: Secondary | ICD-10-CM | POA: Diagnosis not present

## 2022-10-15 DIAGNOSIS — I5032 Chronic diastolic (congestive) heart failure: Secondary | ICD-10-CM | POA: Diagnosis not present

## 2022-10-15 DIAGNOSIS — I700 Atherosclerosis of aorta: Secondary | ICD-10-CM | POA: Diagnosis not present

## 2022-10-15 DIAGNOSIS — R2689 Other abnormalities of gait and mobility: Secondary | ICD-10-CM | POA: Diagnosis not present

## 2022-10-15 DIAGNOSIS — R600 Localized edema: Secondary | ICD-10-CM | POA: Diagnosis not present

## 2022-10-15 LAB — RENAL FUNCTION PANEL
Albumin: 2.6 g/dL — ABNORMAL LOW (ref 3.5–5.0)
Anion gap: 11 (ref 5–15)
BUN: 41 mg/dL — ABNORMAL HIGH (ref 8–23)
CO2: 19 mmol/L — ABNORMAL LOW (ref 22–32)
Calcium: 8 mg/dL — ABNORMAL LOW (ref 8.9–10.3)
Chloride: 103 mmol/L (ref 98–111)
Creatinine, Ser: 1.74 mg/dL — ABNORMAL HIGH (ref 0.61–1.24)
GFR, Estimated: 38 mL/min — ABNORMAL LOW
Glucose, Bld: 96 mg/dL (ref 70–99)
Phosphorus: 2.2 mg/dL — ABNORMAL LOW (ref 2.5–4.6)
Potassium: 4.3 mmol/L (ref 3.5–5.1)
Sodium: 133 mmol/L — ABNORMAL LOW (ref 135–145)

## 2022-10-15 LAB — TYPE AND SCREEN
ABO/RH(D): A POS
Antibody Screen: NEGATIVE
Unit division: 0

## 2022-10-15 LAB — CBC
HCT: 28.4 % — ABNORMAL LOW (ref 39.0–52.0)
Hemoglobin: 9.4 g/dL — ABNORMAL LOW (ref 13.0–17.0)
MCH: 29.7 pg (ref 26.0–34.0)
MCHC: 33.1 g/dL (ref 30.0–36.0)
MCV: 89.9 fL (ref 80.0–100.0)
Platelets: 115 10*3/uL — ABNORMAL LOW (ref 150–400)
RBC: 3.16 MIL/uL — ABNORMAL LOW (ref 4.22–5.81)
RDW: 17.8 % — ABNORMAL HIGH (ref 11.5–15.5)
WBC: 8.8 10*3/uL (ref 4.0–10.5)
nRBC: 0 % (ref 0.0–0.2)

## 2022-10-15 LAB — GLUCOSE, CAPILLARY
Glucose-Capillary: 95 mg/dL (ref 70–99)
Glucose-Capillary: 112 mg/dL — ABNORMAL HIGH (ref 70–99)

## 2022-10-15 LAB — BPAM RBC
Blood Product Expiration Date: 202408302359
ISSUE DATE / TIME: 202408121510
Unit Type and Rh: 6200

## 2022-10-15 LAB — MAGNESIUM: Magnesium: 1.6 mg/dL — ABNORMAL LOW (ref 1.7–2.4)

## 2022-10-15 MED ORDER — SENNOSIDES-DOCUSATE SODIUM 8.6-50 MG PO TABS: 2.0000 | ORAL_TABLET | Freq: Two times a day (BID) | ORAL | Status: DC | PRN

## 2022-10-15 MED ORDER — ORAL CARE MOUTH RINSE: 15.0000 mL | OROMUCOSAL | Status: DC | PRN

## 2022-10-15 MED ORDER — SENNOSIDES-DOCUSATE SODIUM 8.6-50 MG PO TABS
2.0000 | ORAL_TABLET | Freq: Two times a day (BID) | ORAL | Status: DC
  Administered 2022-10-15: 2 via ORAL
  Filled 2022-10-15: qty 2

## 2022-10-15 MED ORDER — SODIUM CHLORIDE 0.9 % IV SOLN
1.0000 g | INTRAVENOUS | Status: DC
  Administered 2022-10-15: 1 g via INTRAVENOUS
  Filled 2022-10-15: qty 10

## 2022-10-15 MED ORDER — PREDNISONE 5 MG PO TABS: ORAL_TABLET | ORAL | Status: AC

## 2022-10-15 MED ORDER — MAGNESIUM SULFATE IN D5W 1-5 GM/100ML-% IV SOLN
1.0000 g | Freq: Once | INTRAVENOUS | Status: AC
  Administered 2022-10-15: 1 g via INTRAVENOUS
  Filled 2022-10-15: qty 100

## 2022-10-15 NOTE — Progress Notes (Signed)
OT Cancellation Note  Patient Details Name: BRADDOX HARNAGE MRN: [id number] DOB: 1935-12-25   Cancelled Treatment:    Reason Eval/Treat Not Completed: Patient declined, no reason specified (states he "didn't like what the doctor told him" and would like to talk with his wife prior to working with therapy. Will follow up for OT tx as schedule permits)  Carver Fila, OTD, OTR/L SecureChat Preferred Acute Rehab [phone]     Dalphine Handing 10/15/2022, 11:11 AM

## 2022-10-15 NOTE — TOC Progression Note (Addendum)
Transition of Care [facility]) - Progression Note    Patient Details  Name: Brett Riley MRN: [id number] Date of Birth: Feb 10, 1936  Transition [facility]) CM/SW Contact  Carley Hammed, LCSW Phone Number: 10/15/2022, 12:16 PM  Clinical Narrative:    Pt's authorization is still pending, pt and medical team updated to barrier. TOC will continue to follow.   approved 8/13 - 8/25 Level I Cert #[id number]. RN noting family would like him to stay until he is able to have a BM. Facility updated.  TOC will continue to follow.    Expected Discharge Plan: Skilled Nursing Facility Barriers to Discharge: English as a second language teacher, Continued Medical Work up  Expected Discharge Plan and Services     Post Acute Care Choice: Skilled Nursing Facility Living arrangements for the past 2 months: Single Family Home Expected Discharge Date: 10/15/22                                     Social Determinants of Health (SDOH) Interventions SDOH Screenings   Food Insecurity: No Food Insecurity (08/18/2022)  Housing: Low Risk  (08/18/2022)  Transportation Needs: No Transportation Needs (08/18/2022)  Utilities: Not At Risk (08/18/2022)  Depression (PHQ2-9): Low Risk  (06/06/2022)  Financial Resource Strain: Low Risk  (08/29/2020)  Physical Activity: Insufficiently Active (08/29/2020)  Social Connections: Unknown (08/29/2020)  Stress: No Stress Concern Present (08/29/2020)  Tobacco Use: Low Risk  (10/12/2022)    Readmission Risk Interventions     No data to display

## 2022-10-15 NOTE — Progress Notes (Signed)
RN has attempted to call report to [facility] multiple times, RN will attempt once again before change of shift.  Sherilyn Banker, RN

## 2022-10-15 NOTE — Progress Notes (Addendum)
Occupational Therapy Treatment Patient Details Name: Brett Riley MRN: [id number] DOB: October 25, 1935 Today's Date: 10/15/2022   History of present illness Pt is an 87 y.o. male who presented 10/11/22 with L flank ecchymosis. CT abdomen pelvis and CT stone chaser did not showed any internal hematoma and retroperitoneal hematoma. Pt found to have community-acquired pneumonia. Imaging also suggesting L1-L2 level discitis osteomyelitis. Of note, recently admitted to [facility] 6/16 - 7/24 with respiratory failure and AKI and discharged to SNF. PMH: MI, CHF, a-fib, HTN, anemia, cancer, celiac artery stenosis, carotid artery disease, CAD   OT comments  Pt not progressing towards goals this session, reports he is a little distraught over news he received. Pt agreeable for transfer to chair, needing min-mod A to sit EOB however with strong posterior lean. Upon sitting EOB pt needing to have BM, encouraged pt to transfer [facility] with use of Stedy and NT available for second person assist, however pt declines and requests to use bedpan. Pt presenting with impairments listed below, will follow acutely. Patient will benefit from continued inpatient follow up therapy, <3 hours/day to maximize safety/ind with ADLs/functional mobility.       If plan is discharge home, recommend the following:  Two people to help with walking and/or transfers;A lot of help with bathing/dressing/bathroom;Assistance with cooking/housework;Direct supervision/assist for medications management;Direct supervision/assist for financial management;Assist for transportation;Help with stairs or ramp for entrance   Equipment Recommendations  Other (comment) (defer)    Recommendations for Other Services PT consult;Rehab consult    Precautions / Restrictions Precautions Precautions: Fall;Other (comment) Precaution Comments: watch SpO2 Restrictions Weight Bearing Restrictions: No Other Position/Activity Restrictions: monitor SpO2       Mobility  Bed Mobility Overal bed mobility: Needs Assistance Bed Mobility: Sidelying to Sit, Sit to Sidelying   Sidelying to sit: Mod assist     Sit to sidelying: Min assist General bed mobility comments: assist for trunk elevation and BLEs    Transfers                   General transfer comment: pt declines     Balance Overall balance assessment: Needs assistance Sitting-balance support: Bilateral upper extremity supported, Feet supported Sitting balance-Leahy Scale: Poor Sitting balance - Comments: mod A to remain upright at EOB, dense cues to scoot to EOB                                   ADL either performed or assessed with clinical judgement   ADL Overall ADL's : Needs assistance/impaired                         Toilet Transfer: Maximal assistance Toilet Transfer Details (indicate cue type and reason): for rolling/bedpan placement Toileting- Clothing Manipulation and Hygiene: Maximal assistance;Bed level       Functional mobility during ADLs: Moderate assistance      Extremity/Trunk Assessment Upper Extremity Assessment Upper Extremity Assessment: Generalized weakness   Lower Extremity Assessment Lower Extremity Assessment: Defer to PT evaluation        Vision   Vision Assessment?: No apparent visual deficits   Perception Perception Perception: Not tested   Praxis Praxis Praxis: Not tested    Cognition Arousal: Alert Behavior During Therapy: [facility] for tasks assessed/performed Overall Cognitive Status: Within Functional Limits for tasks assessed  Exercises      Shoulder Instructions       General Comments VSS on RA    Pertinent Vitals/ Pain       Pain Assessment Pain Assessment: No/denies pain  Home Living                                          Prior Functioning/Environment              Frequency  Min 1X/week        Progress  Toward Goals  OT Goals(current goals can now be found in the care plan section)  Progress towards OT goals: Not progressing toward goals - comment (self limiting)  Acute Rehab OT Goals Patient Stated Goal: none stated OT Goal Formulation: With patient Time For Goal Achievement: 10/26/22 Potential to Achieve Goals: Good ADL Goals Pt Will Perform Upper Body Dressing: with min assist;sitting Pt Will Perform Lower Body Dressing: with mod assist;sit to/from stand;sitting/lateral leans;bed level Pt Will Transfer to Toilet: with mod assist;bedside commode;squat pivot transfer;stand pivot transfer Additional ADL Goal #1: pt will perform bed mobility with min A in prep for ADLs  Plan Frequency remains appropriate;Discharge plan remains appropriate    Co-evaluation                 AM-PAC OT "6 Clicks" Daily Activity     Outcome Measure   Help from another person eating meals?: A Little Help from another person taking care of personal grooming?: A Little Help from another person toileting, which includes using toliet, bedpan, or urinal?: A Lot Help from another person bathing (including washing, rinsing, drying)?: A Lot Help from another person to put on and taking off regular upper body clothing?: A Lot Help from another person to put on and taking off regular lower body clothing?: A Lot 6 Click Score: 14    End of Session    OT Visit Diagnosis: Unsteadiness on feet (R26.81);Muscle weakness (generalized) (M62.81)   Activity Tolerance Patient limited by fatigue   Patient Left in bed;with call bell/phone within reach;with bed alarm set   Nurse Communication Mobility status; pt on bedpan        Time: 1332-1343 OT Time Calculation (min): 11 min  Charges: OT General Charges $OT Visit: 1 Visit OT Treatments $Therapeutic Activity: 8-22 mins  Carver Fila, OTD, OTR/L SecureChat Preferred Acute Rehab [phone]    K Koonce 10/15/2022, 1:50 PM

## 2022-10-15 NOTE — Consult Note (Signed)
   [facility] CM Inpatient Consult   10/15/2022  Brett Riley 25-Dec-1935 [id number]  Covering for Charlesetta Shanks, RN  Update: Pt discharging to [facility]) today. Liaison will alert the PAC-RN for ongoing care management services.  Elliot Cousin, RN, [facility] Liaison [place]   Population Health Office Hours MTWF  8:00 am-6:00 pm Off on Thursday [phone] mobile [phone] [Office toll free line] Office Hours are M-F 8:30 - 5 pm [email or web address]

## 2022-10-15 NOTE — Progress Notes (Signed)
Unable to reach [facility] to give report on patient.

## 2022-10-15 NOTE — Care Management Important Message (Signed)
Important Message  Patient Details  Name: Brett Riley MRN: [id number] Date of Birth: Jul 27, 1935   Medicare Important Message Given:  Yes      Stefan Church 10/15/2022, 11:56 AM

## 2022-10-15 NOTE — Discharge Summary (Addendum)
Physician Discharge Summary  Brett Riley QMV:[id number] DOB: 07-05-1935 DOA: 10/11/2022  PCP: Eloisa Northern, MD  Admit date: 10/11/2022 Discharge date: 10/15/2022 Admitted From: SNF Disposition: SNF Recommendations for Outpatient Follow-up:  Outpatient follow-up with ID as previously planned Ambulatory referral to rheumatology if it hasn't done before Check CBC and CMP in 1 week Please follow up on the following pending results: None   Discharge Condition: Stable CODE STATUS: DNR   Hospital course 87 year old M with PMH of diastolic CHF, chronic hypoxic RF on 2 L, CAD, PAF on Eliquis, HTN, anemia, thrombocytopenia and recent hospitalization from 6/16-7/24 for MSSA bacteremia with vertebral osteomyelitis, AKI and respiratory failure presenting from SNF with left flank ecchymosis and admitted for bilateral pneumonia and right flank bruising/ecchymosis.  Patient is on doxycycline for MSSA bacteremia/osteomyelitis.  Initially started on cefepime vancomycin for pneumonia that was later de-escalated to IV ceftriaxone.  Also received IV Lasix for possible acute diastolic CHF.  Unfortunately, he developed AKI.  Diuretics discontinued.  AKI improved.  On the day of discharge, no cardiopulmonary symptoms.  Appears euvolemic.  On room air.  Completed antibiotic course for possible community-acquired pneumonia.  Recommend ambulatory referral to rheumatology if this has not been done before.  Outpatient follow-up with ID as previously planned.    See individual problem list below for more.   Problems addressed during this hospitalization Principal Problem:   CAP (community acquired pneumonia) Active Problems:   Bruising of the right flank   Vertebral osteomyelitis continue doxycycline until 10/24/2022 [facility])   Aortic atherosclerosis (HCC)   Peripheral vascular disease (HCC)   Paroxysmal atrial fibrillation (HCC)   Chronic diastolic CHF (congestive heart failure) (HCC)   History of CAD (coronary  artery disease)   CKD (chronic kidney disease), stage II   Community-acquired pneumonia/bilateral lower lobe pneumonia: Full RVP, blood cultures and MRSA screen negative.  On room air.  Currently without respiratory distress. -Initially received vancomycin and cefepime and de-escalated to IV ceftriaxone and completed 5 days course.. -Already on doxycycline for MSSA bacteremia/osteomyelitis. -Continue Bactrim for PJP prophylaxis since patient is on significant dose of prednisone for possible vasculitis until 11/25/2022. -Outpatient follow-up with pulmonology +/- rheumatology.   Chronic hypoxic respiratory failure: Currently on room air.  No cardiopulmonary symptoms. Suspected systemic pulmonary disease/vasculitis: Had extensive workup in 08/2022.  Autoimmune workup negative except for elevated ANA, IgA and ribonucleoprotein.  Was discharged on prednisone taper and Bactrim for PJP prophylaxis last month. -Continue prednisone taper until 11/25/2022 as previously planned. -Continue p.o. Bactrim for PJP prophylaxis. -Continue incentive spirometry   Right-sided flank bruising and ecchymosis: No evidence of retroperitoneal hematoma on CT abdomen and pelvis.      AKI: His Cr was 0.9-1.0 in the of May 2024.  His Cr was up and down since beginning of June but never been down to baseline.  It seems he has contrast with CT on admission.  He also received some Lasix due to leg swelling.  AKI improved. Recent Labs    09/20/22 0914 09/21/22 0521 09/22/22 0629 09/23/22 0446 09/24/22 0444 09/25/22 0604 10/11/22 1939 10/13/22 0105 10/14/22 0051 10/15/22 0126  BUN 99* 102* 98* 102* 100* 92* 32* 34* 40* 41*  CREATININE 4.25* 4.38* 4.31* 4.12* 3.71* 3.39* 1.62* 1.70* 2.24* 1.74*  -Recheck renal function in 1 week. -Avoid nephrotoxic meds.   Symptomatic anemia: H&H relatively stable.  Transfused 1 unit on 8/12 due to fatigue.  H&H improved to 9.4.  Symptoms improved. Recent Labs    09/19/22 0630  09/19/22 1659 09/20/22 0914 09/22/22 0629 09/25/22 0604 10/11/22 1939 10/12/22 2221 10/13/22 0105 10/14/22 0051 10/15/22 0126  HGB 8.3* 8.2* 8.5* 8.1* 9.6* 8.7* 8.1* 8.2* 7.9* 9.4*  -Recheck CBC in 1 week   Vertebral osteomyelitis (continue oral doxycycline until 10/24/2022) -Continue doxycycline per recommendation by ID until 10/24/2022.      Paroxysmal atrial fibrillation: Rate controlled.  CHA2DS2-VASc score = 4 -Continue Eliquis and Cardizem and Toprol-XL.     Chronic HFpEF: Has edema likely postural.  Appears euvolemic on exam.  Lung exam clear.  No respiratory distress.  Not on diuretics.     History of CAD/PAD: Stable. -Continue Plavix Toprol and Crestor  Constipation: resolved -Bowel regimen     Age-related frailty/physical deconditioning: -Continue PT/OT at SNF.  Advance care planning: Patient has a living will stating that no life prolonging measures.  Initially, he expressed interest in cardiopulmonary resuscitation but wanted to clarify with his wife.  Patient's wife arrived later and stated that he has DNR.  Patient is in agreement with his wife.  Change CODE STATUS to DNR/DNI.       Time spent 35 minutes  Vital signs Vitals:   10/14/22 2339 10/15/22 0424 10/15/22 0746 10/15/22 1101  BP: (!) 146/79 (!) 156/90 (!) 141/75 (!) 156/81  Pulse: 75 78 81 80  Temp: 98.2 F (36.8 C) 98.1 F (36.7 C) 97.9 F (36.6 C) 98 F (36.7 C)  Resp: 16 17 15  (!) 21  Height:      Weight:  83.4 kg    SpO2: 94% 96% 95% 96%  TempSrc: Oral Oral Oral Oral  BMI (Calculated):  24.93       Discharge exam  GENERAL: No apparent distress.  Nontoxic. HEENT: MMM.  Vision and hearing grossly intact.  NECK: Supple.  No apparent JVD.  RESP:  No IWOB.  Fair aeration bilaterally. CVS: Irregular rhythm.  Normal rate.  Heart sounds normal.  ABD/GI/GU: BS+. Abd soft, NTND.  MSK/EXT:  Moves extremities. No apparent deformity. Trace BLE edema.  SKIN: no apparent skin lesion or  wound NEURO: Awake and alert. Oriented appropriately.  No apparent focal neuro deficit. PSYCH: Calm. Normal affect.   Discharge Instructions Discharge Instructions     Diet - low sodium heart healthy   Complete by: As directed    Increase activity slowly   Complete by: As directed       Allergies as of 10/15/2022   No Known Allergies      Medication List     STOP taking these medications    amLODipine 5 MG tablet Commonly known as: NORVASC       TAKE these medications    acidophilus Caps capsule Take 1 capsule by mouth daily.   apixaban 2.5 MG Tabs tablet Commonly known as: ELIQUIS Take 1 tablet (2.5 mg total) by mouth 2 (two) times daily.   clopidogrel 75 MG tablet Commonly known as: PLAVIX TAKE 1 TABLET BY MOUTH EVERY DAY WITH BREAKFAST   cyanocobalamin 1000 MCG tablet Commonly known as: VITAMIN B12 Take 1,000 mcg by mouth daily.   diltiazem 360 MG 24 hr capsule Commonly known as: CARDIZEM CD Take 1 capsule (360 mg total) by mouth daily.   doxycycline 50 MG capsule Commonly known as: VIBRAMYCIN Take 2 capsules (100 mg total) by mouth 2 (two) times daily. What changed: additional instructions Notes to patient: Last dose will the the evening of  10/24/2022   feeding supplement Liqd Take 237 mLs by mouth 2 (two) times daily  between meals. What changed: when to take this   hydrALAZINE 25 MG tablet Commonly known as: APRESOLINE Take 25 mg by mouth every 6 (six) hours as needed (SBP >160).   melatonin 5 MG Tabs Take 0.5 tablets (2.5 mg total) by mouth at bedtime as needed.   metoprolol succinate 100 MG 24 hr tablet Commonly known as: TOPROL-XL TAKE 1 TABLET BY MOUTH EVERY DAY WITH A MEAL   multivitamins with iron Tabs tablet Take 1 tablet by mouth daily.   naloxone 4 MG/0.1ML Liqd nasal spray kit Commonly known as: NARCAN Place 4 mg into the nose 3 (three) times daily as needed (opioid overdose).   pantoprazole 40 MG tablet Commonly known as:  PROTONIX Take 1 tablet (40 mg total) by mouth daily.   polyethylene glycol 17 g packet Commonly known as: MIRALAX / GLYCOLAX Take 17 g by mouth daily.   predniSONE 5 MG tablet Commonly known as: DELTASONE Take 7 tablets (35 mg total) by mouth daily with breakfast for 5 days, THEN 6 tablets (30 mg total) daily with breakfast for 7 days, THEN 5 tablets (25 mg total) daily with breakfast for 7 days, THEN 4 tablets (20 mg total) daily with breakfast for 7 days, THEN 3 tablets (15 mg total) daily with breakfast for 7 days, THEN 2 tablets (10 mg total) daily with breakfast for 7 days, THEN 1 tablet (5 mg total) daily with breakfast for 7 days. Start taking on: October 15, 2022 What changed: See the new instructions.   rosuvastatin 10 MG tablet Commonly known as: CRESTOR Take 1 tablet (10 mg total) by mouth daily.   senna 8.6 MG Tabs tablet Commonly known as: SENOKOT Take 1 tablet by mouth daily.   senna-docusate 8.6-50 MG tablet Commonly known as: Senokot-S Take 2 tablets by mouth 2 (two) times daily between meals as needed for moderate constipation.   sodium chloride 0.65 % Soln nasal spray Commonly known as: OCEAN Place 1 spray into both nostrils as needed for congestion.   sulfamethoxazole-trimethoprim 800-160 MG tablet Commonly known as: BACTRIM DS Take 1 tablet by mouth 3 (three) times a week. What changed: when to take this Notes to patient: Take this on Monday, Wednesday and Friday   traMADol 50 MG tablet Commonly known as: ULTRAM Take 50 mg by mouth every 8 (eight) hours as needed for moderate pain.   traMADol HCl 100 MG Tabs Take 100 mg by mouth every 12 (twelve) hours as needed (severe pain).        Consultations: None  Procedures/Studies:   CT CHEST ABDOMEN PELVIS W CONTRAST  Result Date: 10/11/2022 CLINICAL DATA:  Sepsis, right flank swelling. EXAM: CT CHEST, ABDOMEN, AND PELVIS WITH CONTRAST TECHNIQUE: Multidetector CT imaging of the chest, abdomen and  pelvis was performed following the standard protocol during bolus administration of intravenous contrast. RADIATION DOSE REDUCTION: This exam was performed according to the departmental dose-optimization program which includes automated exposure control, adjustment of the mA and/or kV according to patient size and/or use of iterative reconstruction technique. CONTRAST:  65mL OMNIPAQUE IOHEXOL 350 MG/ML SOLN COMPARISON:  CT abdomen and pelvis 10/11/2022. CT chest 09/05/2022. Lumbar spine MRI 08/02/2022. FINDINGS: CT CHEST FINDINGS Cardiovascular: The heart is mildly enlarged. There is no pericardial effusion. 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 Overall measurement at this level is 4.8 by 3.6 cm. This appears unchanged from prior. There are atherosclerotic calcifications of the aorta and coronary arteries. Mediastinum/Nodes: No enlarged  mediastinal, hilar, or axillary lymph nodes. Thyroid gland, trachea, and esophagus demonstrate no significant findings. Lungs/Pleura: There are trace bilateral pleural effusions which have decreased from prior. There is new airspace consolidation in the right lower lobe. Additionally, patchy ground-glass opacities in the right lower lobe have increased. Ground-glass opacities in the lingula and left lower lobe have also slightly increased. Previously identified ground-glass and airspace opacities in the upper lobes have significantly decreased. Bronchiectasis persists in the left lower lobe. There is no pneumothorax. Trachea and central airways are patent. Musculoskeletal: No chest wall mass or suspicious bone lesions identified. CT ABDOMEN PELVIS FINDINGS Hepatobiliary: No focal liver abnormality is seen. No gallstones, gallbladder wall thickening, or biliary dilatation. Pancreas: Unremarkable. No pancreatic ductal dilatation or surrounding inflammatory changes. Spleen: Normal in size without focal abnormality.  Adrenals/Urinary Tract: Adrenal glands are unremarkable. Kidneys are normal, without renal calculi, focal lesion, or hydronephrosis. Bladder is unremarkable. Stomach/Bowel: There is gaseous distention of the sigmoid colon without definite twisting. There is no definite bowel obstruction. There is no focal wall thickening or inflammation. The appendix is not seen. Small bowel and stomach are within normal limits. Vascular/Lymphatic: Aorta and IVC are normal in size. Aortic/bi-iliac stent graft present. Adjacent to the anterior neck of the pancreas there is a 2.3 by 1.0 cm density which is unchanged, possibly an enlarged lymph node or exophytic lesion from the pancreas. No other enlarged lymph nodes are seen. Reproductive: Prostate is unremarkable. Other: No ascites.  Stable mild body wall edema. Musculoskeletal: The bones are osteopenic. Erosive changes are again seen along the endplates at L1-L2 corresponding to patient's osteomyelitis/discitis seen on prior MRI. IMPRESSION: 1. New airspace disease in the right lower lobe concerning for pneumonia. 2. Additional ground-glass opacities in the right lower lobe, lingula and left lower lobe have increased. Findings may be infectious/inflammatory. 3. Trace bilateral pleural effusions have decreased from prior. 4. Gaseous distention of the sigmoid colon without definite evidence for volvulus or obstruction. 5. Mild body wall edema. 6. Stable focal aneurysm versus dissection of the proximal aortic arch measuring up to 4.8 cm Recommend semi-annual imaging followup by CTA or MRA and referral to cardiothoracic surgery if not already obtained. This recommendation follows 2010 ACCF/AHA/AATS/ACR/ASA/SCA/SCAI/SIR/STS/SVM Guidelines for the Diagnosis and Management of Patients With Thoracic Aortic Disease. Circulation. 2010; 121: V564-P32. Aortic aneurysm NOS (ICD10-I71.9) . 7. Stable density adjacent to the anterior neck of the pancreas, possibly an enlarged lymph node or  exophytic lesion from the pancreas. 8. Stable erosive changes along the endplates at L1-L2 corresponding to patient's osteomyelitis/discitis seen on prior MRI. 9. Aortic atherosclerosis. Aortic Atherosclerosis (ICD10-I70.0). Electronically Signed   By: Darliss Cheney M.D.   On: 10/11/2022 22:45   CT Renal Stone Study  Result Date: 10/11/2022 CLINICAL DATA:  Abdominal pain, right flank pain EXAM: CT ABDOMEN AND PELVIS WITHOUT CONTRAST TECHNIQUE: Multidetector CT imaging of the abdomen and pelvis was performed following the standard protocol without IV contrast. RADIATION DOSE REDUCTION: This exam was performed according to the departmental dose-optimization program which includes automated exposure control, adjustment of the mA and/or kV according to patient size and/or use of iterative reconstruction technique. COMPARISON:  07/20/2022 FINDINGS: Lower chest: Bronchiectasis is seen in the lower lung fields. Patchy infiltrates are seen in the posterior lower lung fields, more so on the right side. Small right pleural effusion is seen. Minimal left pleural effusion is seen. Coronary artery calcifications are seen. Hepatobiliary: There is no dilation of bile ducts. There is 8 mm low-density in the  liver, possibly cyst or hemangioma. There is subtle increased density in the dependent portion of gallbladder lumen suggesting sludge or tiny stones. There is no wall thickening in gallbladder. Pancreas: No focal abnormalities are seen. Spleen: Unremarkable. Adrenals/Urinary Tract: Adrenals are unremarkable. There is no hydronephrosis. Vascular calcifications are seen in right renal artery branch in the upper pole. There are no definite demonstrable renal or ureteral stones. Urinary bladder is unremarkable. Stomach/Bowel: Stomach is not distended. Small bowel loops are not dilated. Appendix is not seen. There is gaseous distention of transverse colon. Moderate amount of stool is seen in right colon and rectosigmoid.  Vascular/Lymphatic: Arterial calcifications are seen in aorta and its major branches. There is interval placement of aorto bi iliac stent. There is no retroperitoneal hematoma. Reproductive: Unremarkable. Other: There is no ascites or pneumoperitoneum. There is subcutaneous edema, possibly suggesting anasarca. Small bilateral inguinal hernias containing fat are seen. Musculoskeletal: Degenerative changes are noted in lumbar spine with encroachment of neural foramina at multiple levels. There is cortical irregularity in the endplates adjacent to L1-L2 disc space which was not evident in the previous CT. In previous MR lumbar spine done on 08/02/2022, there was evidence of discitis osteomyelitis at L1-L2 level. The endplates at L1-L2 level appear more irregular in comparison with the MRI. IMPRESSION: There is no evidence of intestinal obstruction or pneumoperitoneum. There is no hydronephrosis. There is gaseous distention of transverse colon suggesting ileus. There is cortical irregularity in the endplates at L1-L2 level suggesting discitis osteomyelitis. Irregularity in the bony endplates at L1-L2 level appear more prominent in comparison with MRI done on 08/02/2022. There is no demonstrable paraspinal fluid collection. There are patchy infiltrates in both lower lung fields, more so on the right side suggesting atelectasis/pneumonia. Small bilateral pleural effusions, more so on the right side. Bronchiectasis is seen in the lower lung fields. Aortic arteriosclerosis. Coronary artery disease. Aorto bi iliac stent is noted in place. There is subtle increased density in the dependent portion of gallbladder lumen suggesting possible sludge or tiny stones. There is no wall thickening or pericholecystic fluid collection. Electronically Signed   By: Ernie Avena M.D.   On: 10/11/2022 20:17   DG Chest Port 1 View  Result Date: 09/23/2022 CLINICAL DATA:  Pulmonary infiltrates on chest x-ray. EXAM: PORTABLE CHEST 1  VIEW COMPARISON:  Chest radiograph 09/17/2022 and CT 09/18/2022 FINDINGS: The cardiac silhouette remains enlarged. Aortic atherosclerosis is noted. Widespread, predominantly interstitial type densities in both lungs have mildly improved from the prior radiograph. There may be a trace right pleural effusion. No pneumothorax is identified. IMPRESSION: Mildly improved bilateral lung infiltrates. Electronically Signed   By: Sebastian Ache M.D.   On: 09/23/2022 11:01   ECHOCARDIOGRAM LIMITED  Result Date: 09/20/2022    ECHOCARDIOGRAM LIMITED REPORT   Patient Name:   Brett Riley Date of Exam: 09/18/2022 Medical Rec #:  [id number]      Height:       72.0 in Accession #:    [id number]     Weight:       194.7 lb Date of Birth:  02-May-1935     BSA:          2.106 m Patient Age:    86 years       BP:           122/70 mmHg Patient Gender: M              HR:  81 bpm. Exam Location:  [facility] Procedure: 2D Echo, Limited Echo, Limited Color Doppler and Cardiac Doppler Indications:     R06.00 Dyspnea  History:         Patient has prior history of Echocardiogram examinations, most                  recent 08/19/2022. CAD; Risk Factors:Hypertension and                  Dyslipidemia.  Sonographer:     Daphine Deutscher RDCS Referring Phys:  [id number] Raymon Mutton DUNN Diagnosing Phys: Lorine Bears MD IMPRESSIONS  1. Left ventricular ejection fraction, by estimation, is 50 to 55%. The left ventricle has low normal function. The left ventricle has no regional wall motion abnormalities. Left ventricular diastolic parameters were normal.  2. Right ventricular systolic function is normal. The right ventricular size is normal. There is normal pulmonary artery systolic pressure.  3. Left atrial size was mildly dilated.  4. The mitral valve is normal in structure. Mild to moderate mitral valve regurgitation. No evidence of mitral stenosis.  5. Tricuspid valve regurgitation is mild to moderate.  6. The aortic valve is normal in structure.  Aortic valve regurgitation is not visualized. Aortic valve sclerosis/calcification is present, without any evidence of aortic stenosis. FINDINGS  Left Ventricle: Left ventricular ejection fraction, by estimation, is 50 to 55%. The left ventricle has low normal function. The left ventricle has no regional wall motion abnormalities. The left ventricular internal cavity size was normal in size. There is borderline left ventricular hypertrophy. Left ventricular diastolic parameters were normal. Right Ventricle: The right ventricular size is normal. No increase in right ventricular wall thickness. Right ventricular systolic function is normal. There is normal pulmonary artery systolic pressure. The tricuspid regurgitant velocity is 2.25 m/s, and  with an assumed right atrial pressure of 5 mmHg, the estimated right ventricular systolic pressure is 25.2 mmHg. Left Atrium: Left atrial size was mildly dilated. Right Atrium: Right atrial size was normal in size. Pericardium: There is no evidence of pericardial effusion. Mitral Valve: The mitral valve is normal in structure. There is mild thickening of the mitral valve leaflet(s). Mild to moderate mitral valve regurgitation. No evidence of mitral valve stenosis. Tricuspid Valve: The tricuspid valve is normal in structure. Tricuspid valve regurgitation is mild to moderate. No evidence of tricuspid stenosis. Aortic Valve: The aortic valve is normal in structure. Aortic valve regurgitation is not visualized. Aortic valve sclerosis/calcification is present, without any evidence of aortic stenosis. Pulmonic Valve: The pulmonic valve was normal in structure. Pulmonic valve regurgitation is not visualized. No evidence of pulmonic stenosis. Aorta: The aortic root is normal in size and structure. Venous: The inferior vena cava was not well visualized. IAS/Shunts: No atrial level shunt detected by color flow Doppler. LEFT VENTRICLE PLAX 2D LVIDd:         5.10 cm Diastology LVIDs:          3.60 cm LV e' medial:    8.22 cm/s LV PW:         1.00 cm LV E/e' medial:  10.3 LV IVS:        1.00 cm LV e' lateral:   6.91 cm/s                        LV E/e' lateral: 12.2  RIGHT VENTRICLE RV S prime:     13.20 cm/s TAPSE (M-mode): 2.2 cm LEFT ATRIUM  Index LA diam:    4.40 cm 2.09 cm/m   AORTA Ao Root diam: 3.70 cm MITRAL VALVE               TRICUSPID VALVE MV Area (PHT): 2.48 cm    TR Peak grad:   20.2 mmHg MV Decel Time: 306 msec    TR Vmax:        225.00 cm/s MV E velocity: 84.40 cm/s MV A velocity: 99.10 cm/s MV E/A ratio:  0.85 Lorine Bears MD Electronically signed by Lorine Bears MD Signature Date/Time: 09/20/2022/1:11:47 PM    Final    CARDIAC CATHETERIZATION  Result Date: 09/19/2022 Conclusions: Normal left heart, right heart, and pulmonary artery pressures. Normal cardiac output/index. Recommendations: Findings suggest that worsening hypoxia and lung opacities are not due to cardiogenic edema.  Continue to hold diuretics. Further workup of non-cardiac causes of respiratory decompensation per internal medicine and pulmonology. Yvonne Kendall, MD [facility]  CT CHEST WO CONTRAST  Result Date: 09/18/2022 CLINICAL DATA:  Pneumonia respiratory illness EXAM: CT CHEST WITHOUT CONTRAST TECHNIQUE: Multidetector CT imaging of the chest was performed following the standard protocol without IV contrast. RADIATION DOSE REDUCTION: This exam was performed according to the departmental dose-optimization program which includes automated exposure control, adjustment of the mA and/or kV according to patient size and/or use of iterative reconstruction technique. COMPARISON:  Chest x-ray 09/17/2022, chest CT 09/05/2022, 08/21/2022 FINDINGS: Cardiovascular: Limited evaluation without intravenous contrast. Moderate aortic atherosclerosis. Redemonstrated contour abnormality of the ascending thoracic aorta just proximal to the arch, this measures 2.9 x 1.4 cm on series 2, image 59, previously 3 x 1.3  cm. Extensive coronary vascular calcification. Cardiomegaly. No pericardial effusion Mediastinum/Nodes: Midline trachea. No thyroid mass. Borderline mediastinal lymph nodes appear slightly increased in size, for example right paratracheal node measures 9 mm on series 2, image 46, previously 7 mm. Esophagus within normal limits Lungs/Pleura: Small bilateral pleural effusions. Bilateral bronchiectasis. Multifocal bilateral heterogeneous ground-glass densities throughout the lungs, worse when compared to CT from 09/05/2022, improved since CT 08/21/2022. Upper Abdomen: No acute finding Musculoskeletal: No acute osseous abnormality. IMPRESSION: 1. Multifocal bilateral heterogeneous ground-glass densities throughout the lungs, worse when compared to CT from 09/05/2022, improved since CT 08/21/2022. Findings could be secondary to multifocal pneumonia, though there may be a component of associated pulmonary edema. Slight interval increase in mediastinal lymph nodes. 2. Cardiomegaly with small bilateral pleural effusions. 3. Redemonstrated contour abnormality of the ascending thoracic aorta either representing penetrating ulcer or aneurysm. Correlation with CT angiography when clinically feasible is suggested. 4. Aortic atherosclerosis. Aortic Atherosclerosis (ICD10-I70.0). Electronically Signed   By: Jasmine Pang M.D.   On: 09/18/2022 16:46   DG Chest Port 1 View  Result Date: 09/17/2022 CLINICAL DATA:  Respiratory distress and hypoxia EXAM: PORTABLE CHEST 1 VIEW COMPARISON:  Chest radiograph 1 day prior FINDINGS: The cardiomediastinal silhouette is stable Diffusely increased interstitial markings throughout both lungs again seen with slight interval improvement in the right lung. There is no significant pleural effusion. There is no pneumothorax. There is no acute osseous abnormality. IMPRESSION: Diffusely increased interstitial markings throughout both lungs with slight interval improvement in the right lung since  the study from 1 day prior. Electronically Signed   By: Lesia Hausen M.D.   On: 09/17/2022 12:10   DG Abd 1 View  Result Date: 09/16/2022 CLINICAL DATA:  Abdominal pain EXAM: ABDOMEN - 1 VIEW COMPARISON:  None FINDINGS: There is gaseous distention of small-bowel loops and colon. There is previous intravascular  stent repair in abdominal aorta. Increased interstitial markings in the lower lung fields suggest possible scarring. Arterial calcifications are seen. Degenerative changes are noted in lumbar spine. IMPRESSION: There is mild gaseous dilation of small bowel loops. There is moderate to marked gaseous distention in colon. Findings suggest possible ileus. Arteriosclerosis. Previous endovascular stent repair in aorta. Possible scarring in the lower lung fields. Lumbar spondylosis. Electronically Signed   By: Ernie Avena M.D.   On: 09/16/2022 13:24   DG Chest Port 1 View  Result Date: 09/16/2022 CLINICAL DATA:  Hypoxia EXAM: PORTABLE CHEST 1 VIEW COMPARISON:  09/13/2022 FINDINGS: Cardiomegaly. Worsened interstitial and alveolar density diffusely. Findings could be due to progressive pneumonia or progressive edema. IMPRESSION: Worsened interstitial and alveolar density diffusely. Findings could be due to progressive pneumonia or progressive edema. Electronically Signed   By: Paulina Fusi M.D.   On: 09/16/2022 11:18       The results of significant diagnostics from this hospitalization (including imaging, microbiology, ancillary and laboratory) are listed below for reference.     Microbiology: Recent Results (from the past 240 hour(s))  Blood culture (routine x 2)     Status: None (Preliminary result)   Collection Time: 10/11/22 11:40 PM   Specimen: BLOOD  Result Value Ref Range Status   Specimen Description BLOOD LEFT ANTECUBITAL  Final   Special Requests   Final    BOTTLES DRAWN AEROBIC AND ANAEROBIC Blood Culture adequate volume   Culture   Final    NO GROWTH 4 DAYS Performed at  [facility], [address] [place], [place] [address]    Report Status PENDING  Incomplete  Blood culture (routine x 2)     Status: None (Preliminary result)   Collection Time: 10/11/22 11:41 PM   Specimen: BLOOD LEFT FOREARM  Result Value Ref Range Status   Specimen Description BLOOD LEFT FOREARM  Final   Special Requests   Final    BOTTLES DRAWN AEROBIC ONLY Blood Culture adequate volume   Culture   Final    NO GROWTH 4 DAYS Performed at [facility], [address], [place], [place] [address]    Report Status PENDING  Incomplete  MRSA Next Gen by PCR, Nasal     Status: None   Collection Time: 10/12/22  3:00 AM  Result Value Ref Range Status   MRSA by PCR Next Gen NOT DETECTED NOT DETECTED Final    Comment: (NOTE) The GeneXpert MRSA Assay (FDA approved for NASAL specimens only), is one component of a comprehensive MRSA colonization surveillance program. It is not intended to diagnose MRSA infection nor to guide or monitor treatment for MRSA infections. Test performance is not FDA approved in patients less than 43 years old. Performed at [facility], [address], [place], [place] [address]   Respiratory (~20 pathogens) panel by PCR     Status: None   Collection Time: 10/12/22  3:01 AM   Specimen: Nasopharyngeal Swab; Respiratory  Result Value Ref Range Status   Adenovirus NOT DETECTED NOT DETECTED Final   Coronavirus 229E NOT DETECTED NOT DETECTED Final    Comment: (NOTE) The Coronavirus on the Respiratory Panel, DOES NOT test for the novel  Coronavirus (2019 nCoV)    Coronavirus HKU1 NOT DETECTED NOT DETECTED Final   Coronavirus NL63 NOT DETECTED NOT DETECTED Final   Coronavirus OC43 NOT DETECTED NOT DETECTED Final   Metapneumovirus NOT DETECTED NOT DETECTED Final   Rhinovirus / Enterovirus NOT DETECTED NOT  DETECTED Final   Influenza A NOT DETECTED NOT DETECTED Final   Influenza B NOT DETECTED NOT DETECTED Final   Parainfluenza Virus 1 NOT  DETECTED NOT DETECTED Final   Parainfluenza Virus 2 NOT DETECTED NOT DETECTED Final   Parainfluenza Virus 3 NOT DETECTED NOT DETECTED Final   Parainfluenza Virus 4 NOT DETECTED NOT DETECTED Final   Respiratory Syncytial Virus NOT DETECTED NOT DETECTED Final   Bordetella pertussis NOT DETECTED NOT DETECTED Final   Bordetella Parapertussis NOT DETECTED NOT DETECTED Final   Chlamydophila pneumoniae NOT DETECTED NOT DETECTED Final   Mycoplasma pneumoniae NOT DETECTED NOT DETECTED Final    Comment: Performed at [facility], [address], [place], [place] [address]     Labs:  CBC: Recent Labs  Lab 10/11/22 1939 10/12/22 2221 10/13/22 0105 10/14/22 0051 10/15/22 0126  WBC 7.2  --  7.9 10.0 8.8  NEUTROABS 6.2  --   --   --   --   HGB 8.7* 8.1* 8.2* 7.9* 9.4*  HCT 27.8* 25.5* 26.6* 24.9* 28.4*  MCV 94.2  --  95.7 91.2 89.9  PLT 135*  --  134* 148* 115*   BMP &GFR Recent Labs  Lab 10/11/22 1939 10/13/22 0105 10/14/22 0051 10/15/22 0126  NA 137 136 131* 133*  K 4.2 4.5 4.5 4.3  CL 101 101 99 103  CO2 22 22 22  19*  GLUCOSE 125* 100* 102* 96  BUN 32* 34* 40* 41*  CREATININE 1.62* 1.70* 2.24* 1.74*  CALCIUM 8.5* 8.2* 8.1* 8.0*  MG  --   --  1.7 1.6*  PHOS  --   --   --  2.2*   Estimated Creatinine Clearance: 34.7 mL/min (A) (by C-G formula based on SCr of 1.74 mg/dL (H)). Liver & Pancreas: Recent Labs  Lab 10/11/22 1939 10/13/22 0105 10/15/22 0126  AST 30 23  --   ALT 43 30  --   ALKPHOS 72 68  --   BILITOT 0.2* 0.5  --   PROT 5.8* 5.5*  --   ALBUMIN 2.7* 2.5* 2.6*   No results for input(s): "LIPASE", "AMYLASE" in the last 168 hours. No results for input(s): "AMMONIA" in the last 168 hours. Diabetic: No results for input(s): "HGBA1C" in the last 72 hours. Recent Labs  Lab 10/14/22 0553 10/14/22 1112 10/14/22 1602 10/14/22 2123 10/15/22 1100  GLUCAP 94 124* 106* 175* 95   Cardiac Enzymes: Recent Labs  Lab 10/11/22 1939  CKTOTAL 31*   No  results for input(s): "PROBNP" in the last 8760 hours. Coagulation Profile: No results for input(s): "INR", "PROTIME" in the last 168 hours. Thyroid Function Tests: No results for input(s): "TSH", "T4TOTAL", "FREET4", "T3FREE", "THYROIDAB" in the last 72 hours. Lipid Profile: No results for input(s): "CHOL", "HDL", "LDLCALC", "TRIG", "CHOLHDL", "LDLDIRECT" in the last 72 hours. Anemia Panel: Recent Labs    10/14/22 0050 10/14/22 0056  VITAMINB12 1,595*  --   FOLATE 12.9  --   FERRITIN 981*  --   TIBC 223*  --   IRON 44*  --   RETICCTPCT  --  1.3   Urine analysis:    Component Value Date/Time   COLORURINE YELLOW 10/12/2022 0010   APPEARANCEUR CLEAR 10/12/2022 0010   LABSPEC 1.020 10/12/2022 0010   PHURINE 7.0 10/12/2022 0010   GLUCOSEU NEGATIVE 10/12/2022 0010   HGBUR MODERATE (A) 10/12/2022 0010   BILIRUBINUR NEGATIVE 10/12/2022 0010   KETONESUR NEGATIVE 10/12/2022 0010   PROTEINUR 30 (A) 10/12/2022 0010  NITRITE NEGATIVE 10/12/2022 0010   LEUKOCYTESUR NEGATIVE 10/12/2022 0010   Sepsis Labs: Invalid input(s): "PROCALCITONIN", "LACTICIDVEN"   SIGNED:  Almon Hercules, MD  [facility] 10/15/2022, 2:44 PM

## 2022-10-15 NOTE — TOC Transition Note (Signed)
Transition of Care [facility]) - CM/SW Discharge Note   Patient Details  Name: Brett Riley MRN: [id number] Date of Birth: Oct 25, 1935  [facility] CM/SW Contact:  Carley Hammed, LCSW Phone Number: 10/15/2022, 2:55 PM   Clinical Narrative:    Pt to be transported to [facility] via [facility]. Nurse to call report to [phone].   Final next level of care: Skilled Nursing Facility Barriers to Discharge: Barriers Resolved   Patient Goals and CMS Choice CMS Medicare.gov Compare Post Acute Care list provided to:: Patient Choice offered to / list presented to : Patient  Discharge Placement                Patient chooses bed at: [facility] Patient to be transferred to facility by: [facility] Name of family member notified: Pt/ Spouse Patient and family notified of of transfer: 10/15/22  Discharge Plan and Services Additional resources added to the After Visit Summary for       Post Acute Care Choice: Skilled Nursing Facility                               Social Determinants of Health (SDOH) Interventions SDOH Screenings   Food Insecurity: No Food Insecurity (10/15/2022)  Housing: Low Risk  (10/15/2022)  Transportation Needs: No Transportation Needs (10/15/2022)  Utilities: Not At Risk (10/15/2022)  Depression (PHQ2-9): Low Risk  (06/06/2022)  Financial Resource Strain: Low Risk  (08/29/2020)  Physical Activity: Insufficiently Active (08/29/2020)  Social Connections: Unknown (08/29/2020)  Stress: No Stress Concern Present (08/29/2020)  Tobacco Use: Low Risk  (10/12/2022)     Readmission Risk Interventions     No data to display

## 2022-10-16 LAB — CULTURE, BLOOD (ROUTINE X 2)
Culture: NO GROWTH
Culture: NO GROWTH
Special Requests: ADEQUATE
Special Requests: ADEQUATE

## 2022-10-16 LAB — LEGIONELLA PNEUMOPHILA SEROGP 1 UR AG: L. pneumophila Serogp 1 Ur Ag: NEGATIVE

## 2022-10-21 DIAGNOSIS — R2689 Other abnormalities of gait and mobility: Secondary | ICD-10-CM | POA: Diagnosis not present

## 2022-10-21 DIAGNOSIS — Z7689 Persons encountering health services in other specified circumstances: Secondary | ICD-10-CM | POA: Diagnosis not present

## 2022-10-21 DIAGNOSIS — G4700 Insomnia, unspecified: Secondary | ICD-10-CM | POA: Diagnosis not present

## 2022-10-21 DIAGNOSIS — G9341 Metabolic encephalopathy: Secondary | ICD-10-CM | POA: Diagnosis not present

## 2022-10-22 DIAGNOSIS — I10 Essential (primary) hypertension: Secondary | ICD-10-CM | POA: Diagnosis not present

## 2022-10-22 DIAGNOSIS — D649 Anemia, unspecified: Secondary | ICD-10-CM | POA: Diagnosis not present

## 2022-10-23 DIAGNOSIS — Z7689 Persons encountering health services in other specified circumstances: Secondary | ICD-10-CM | POA: Diagnosis not present

## 2022-10-23 DIAGNOSIS — G4700 Insomnia, unspecified: Secondary | ICD-10-CM | POA: Diagnosis not present

## 2022-10-23 DIAGNOSIS — G9341 Metabolic encephalopathy: Secondary | ICD-10-CM | POA: Diagnosis not present

## 2022-10-23 DIAGNOSIS — E875 Hyperkalemia: Secondary | ICD-10-CM | POA: Diagnosis not present

## 2022-10-23 DIAGNOSIS — R2689 Other abnormalities of gait and mobility: Secondary | ICD-10-CM | POA: Diagnosis not present

## 2022-10-24 DIAGNOSIS — I10 Essential (primary) hypertension: Secondary | ICD-10-CM | POA: Diagnosis not present

## 2022-10-24 DIAGNOSIS — Z79899 Other long term (current) drug therapy: Secondary | ICD-10-CM | POA: Diagnosis not present

## 2022-10-25 DIAGNOSIS — G9341 Metabolic encephalopathy: Secondary | ICD-10-CM | POA: Diagnosis not present

## 2022-10-25 DIAGNOSIS — Z7689 Persons encountering health services in other specified circumstances: Secondary | ICD-10-CM | POA: Diagnosis not present

## 2022-10-25 DIAGNOSIS — G4700 Insomnia, unspecified: Secondary | ICD-10-CM | POA: Diagnosis not present

## 2022-10-25 DIAGNOSIS — R2689 Other abnormalities of gait and mobility: Secondary | ICD-10-CM | POA: Diagnosis not present

## 2022-10-25 DIAGNOSIS — R600 Localized edema: Secondary | ICD-10-CM | POA: Diagnosis not present

## 2022-10-29 DIAGNOSIS — Z7689 Persons encountering health services in other specified circumstances: Secondary | ICD-10-CM | POA: Diagnosis not present

## 2022-10-29 DIAGNOSIS — K5900 Constipation, unspecified: Secondary | ICD-10-CM | POA: Diagnosis not present

## 2022-11-03 DIAGNOSIS — M4626 Osteomyelitis of vertebra, lumbar region: Secondary | ICD-10-CM | POA: Diagnosis not present

## 2022-11-03 DIAGNOSIS — M6281 Muscle weakness (generalized): Secondary | ICD-10-CM | POA: Diagnosis not present

## 2022-11-03 DIAGNOSIS — R2681 Unsteadiness on feet: Secondary | ICD-10-CM | POA: Diagnosis not present

## 2022-11-03 DIAGNOSIS — I2510 Atherosclerotic heart disease of native coronary artery without angina pectoris: Secondary | ICD-10-CM | POA: Diagnosis not present

## 2022-11-03 DIAGNOSIS — N179 Acute kidney failure, unspecified: Secondary | ICD-10-CM | POA: Diagnosis not present

## 2022-11-03 DIAGNOSIS — Z1331 Encounter for screening for depression: Secondary | ICD-10-CM | POA: Diagnosis not present

## 2022-11-03 DIAGNOSIS — I480 Paroxysmal atrial fibrillation: Secondary | ICD-10-CM | POA: Diagnosis not present

## 2022-11-03 DIAGNOSIS — R9431 Abnormal electrocardiogram [ECG] [EKG]: Secondary | ICD-10-CM | POA: Diagnosis not present

## 2022-11-03 DIAGNOSIS — J9601 Acute respiratory failure with hypoxia: Secondary | ICD-10-CM | POA: Diagnosis not present

## 2022-11-03 DIAGNOSIS — E785 Hyperlipidemia, unspecified: Secondary | ICD-10-CM | POA: Diagnosis not present

## 2022-11-03 DIAGNOSIS — I5030 Unspecified diastolic (congestive) heart failure: Secondary | ICD-10-CM | POA: Diagnosis not present

## 2022-11-03 DIAGNOSIS — I10 Essential (primary) hypertension: Secondary | ICD-10-CM | POA: Diagnosis not present

## 2022-11-03 DIAGNOSIS — I4819 Other persistent atrial fibrillation: Secondary | ICD-10-CM | POA: Diagnosis not present

## 2022-11-03 DIAGNOSIS — M25561 Pain in right knee: Secondary | ICD-10-CM | POA: Diagnosis present

## 2022-11-03 DIAGNOSIS — G9341 Metabolic encephalopathy: Secondary | ICD-10-CM | POA: Diagnosis not present

## 2022-11-03 DIAGNOSIS — Z743 Need for continuous supervision: Secondary | ICD-10-CM | POA: Diagnosis not present

## 2022-11-03 DIAGNOSIS — I482 Chronic atrial fibrillation, unspecified: Secondary | ICD-10-CM | POA: Diagnosis not present

## 2022-11-03 DIAGNOSIS — M1711 Unilateral primary osteoarthritis, right knee: Secondary | ICD-10-CM | POA: Diagnosis not present

## 2022-11-03 DIAGNOSIS — Z7902 Long term (current) use of antithrombotics/antiplatelets: Secondary | ICD-10-CM | POA: Diagnosis not present

## 2022-11-03 DIAGNOSIS — R2689 Other abnormalities of gait and mobility: Secondary | ICD-10-CM | POA: Diagnosis not present

## 2022-11-03 DIAGNOSIS — Z79899 Other long term (current) drug therapy: Secondary | ICD-10-CM | POA: Diagnosis not present

## 2022-11-03 DIAGNOSIS — Z515 Encounter for palliative care: Secondary | ICD-10-CM | POA: Diagnosis not present

## 2022-11-03 DIAGNOSIS — I110 Hypertensive heart disease with heart failure: Secondary | ICD-10-CM | POA: Diagnosis not present

## 2022-11-03 DIAGNOSIS — E872 Acidosis, unspecified: Secondary | ICD-10-CM | POA: Diagnosis not present

## 2022-11-03 DIAGNOSIS — Z7901 Long term (current) use of anticoagulants: Secondary | ICD-10-CM | POA: Diagnosis not present

## 2022-11-03 DIAGNOSIS — I2584 Coronary atherosclerosis due to calcified coronary lesion: Secondary | ICD-10-CM | POA: Diagnosis not present

## 2022-11-03 DIAGNOSIS — N1832 Chronic kidney disease, stage 3b: Secondary | ICD-10-CM | POA: Diagnosis not present

## 2022-11-03 DIAGNOSIS — M25461 Effusion, right knee: Secondary | ICD-10-CM | POA: Diagnosis not present

## 2022-11-03 DIAGNOSIS — N183 Chronic kidney disease, stage 3 unspecified: Secondary | ICD-10-CM | POA: Diagnosis not present

## 2022-11-03 DIAGNOSIS — J90 Pleural effusion, not elsewhere classified: Secondary | ICD-10-CM | POA: Diagnosis not present

## 2022-11-03 DIAGNOSIS — M25569 Pain in unspecified knee: Secondary | ICD-10-CM | POA: Diagnosis not present

## 2022-11-03 DIAGNOSIS — D649 Anemia, unspecified: Secondary | ICD-10-CM | POA: Diagnosis not present

## 2022-11-03 DIAGNOSIS — E871 Hypo-osmolality and hyponatremia: Secondary | ICD-10-CM | POA: Diagnosis not present

## 2022-11-03 DIAGNOSIS — R600 Localized edema: Secondary | ICD-10-CM | POA: Diagnosis not present

## 2022-11-03 DIAGNOSIS — Z7689 Persons encountering health services in other specified circumstances: Secondary | ICD-10-CM | POA: Diagnosis not present

## 2022-11-03 DIAGNOSIS — M4620 Osteomyelitis of vertebra, site unspecified: Secondary | ICD-10-CM | POA: Diagnosis not present

## 2022-11-03 DIAGNOSIS — E875 Hyperkalemia: Secondary | ICD-10-CM | POA: Diagnosis not present

## 2022-11-03 DIAGNOSIS — D631 Anemia in chronic kidney disease: Secondary | ICD-10-CM | POA: Diagnosis not present

## 2022-11-03 DIAGNOSIS — R278 Other lack of coordination: Secondary | ICD-10-CM | POA: Diagnosis not present

## 2022-11-03 DIAGNOSIS — R2241 Localized swelling, mass and lump, right lower limb: Secondary | ICD-10-CM | POA: Diagnosis not present

## 2022-11-03 DIAGNOSIS — G4700 Insomnia, unspecified: Secondary | ICD-10-CM | POA: Diagnosis not present

## 2022-11-03 DIAGNOSIS — F5102 Adjustment insomnia: Secondary | ICD-10-CM | POA: Diagnosis not present

## 2022-11-05 DIAGNOSIS — D631 Anemia in chronic kidney disease: Secondary | ICD-10-CM | POA: Diagnosis not present

## 2022-11-05 DIAGNOSIS — N179 Acute kidney failure, unspecified: Secondary | ICD-10-CM | POA: Diagnosis not present

## 2022-11-05 DIAGNOSIS — N1832 Chronic kidney disease, stage 3b: Secondary | ICD-10-CM | POA: Diagnosis not present

## 2022-11-05 DIAGNOSIS — R600 Localized edema: Secondary | ICD-10-CM | POA: Diagnosis not present

## 2022-11-06 DIAGNOSIS — G4700 Insomnia, unspecified: Secondary | ICD-10-CM | POA: Diagnosis not present

## 2022-11-06 DIAGNOSIS — Z7689 Persons encountering health services in other specified circumstances: Secondary | ICD-10-CM | POA: Diagnosis not present

## 2022-11-07 DIAGNOSIS — D649 Anemia, unspecified: Secondary | ICD-10-CM | POA: Diagnosis not present

## 2022-11-11 ENCOUNTER — Other Ambulatory Visit: Payer: Self-pay | Admitting: *Deleted

## 2022-11-11 NOTE — Patient Outreach (Signed)
Per [facility] Mr. Panciera resides in [place] skilled nursing facility. Screening for potential care coordination services as benefit of health plan and Primary Care Provider.   Secure communication sent to Deseree, [employer] social worker for collaboration about transition plans and potential care coordination needs.   Will continue to follow.   Raiford Noble, MSN, RN, BSN [place]  [facility], Healthy Communities RN Post- Acute Care Coordinator Direct Dial: [phone]

## 2022-11-13 DIAGNOSIS — I5030 Unspecified diastolic (congestive) heart failure: Secondary | ICD-10-CM | POA: Diagnosis not present

## 2022-11-13 DIAGNOSIS — R2681 Unsteadiness on feet: Secondary | ICD-10-CM | POA: Diagnosis not present

## 2022-11-13 DIAGNOSIS — N179 Acute kidney failure, unspecified: Secondary | ICD-10-CM | POA: Diagnosis not present

## 2022-11-13 DIAGNOSIS — Z7689 Persons encountering health services in other specified circumstances: Secondary | ICD-10-CM | POA: Diagnosis not present

## 2022-11-13 DIAGNOSIS — I10 Essential (primary) hypertension: Secondary | ICD-10-CM | POA: Diagnosis not present

## 2022-11-13 DIAGNOSIS — R600 Localized edema: Secondary | ICD-10-CM | POA: Diagnosis not present

## 2022-11-13 DIAGNOSIS — I480 Paroxysmal atrial fibrillation: Secondary | ICD-10-CM | POA: Diagnosis not present

## 2022-11-14 DIAGNOSIS — M25461 Effusion, right knee: Secondary | ICD-10-CM | POA: Diagnosis not present

## 2022-11-14 DIAGNOSIS — Z7689 Persons encountering health services in other specified circumstances: Secondary | ICD-10-CM | POA: Diagnosis not present

## 2022-11-14 DIAGNOSIS — G4700 Insomnia, unspecified: Secondary | ICD-10-CM | POA: Diagnosis not present

## 2022-11-14 DIAGNOSIS — E875 Hyperkalemia: Secondary | ICD-10-CM | POA: Diagnosis not present

## 2022-11-14 DIAGNOSIS — G9341 Metabolic encephalopathy: Secondary | ICD-10-CM | POA: Diagnosis not present

## 2022-11-14 DIAGNOSIS — R2689 Other abnormalities of gait and mobility: Secondary | ICD-10-CM | POA: Diagnosis not present

## 2022-11-14 DIAGNOSIS — D649 Anemia, unspecified: Secondary | ICD-10-CM | POA: Diagnosis not present

## 2022-11-18 ENCOUNTER — Other Ambulatory Visit: Payer: Self-pay | Admitting: *Deleted

## 2022-11-18 NOTE — Patient Outreach (Signed)
Mr. Ellers resides in [place] skilled nursing facility. Screening for potential care coordination services as benefit of health plan and Primary Care Provider.  Collaboration with Deseree, Geophysicist/field seismologist. Mr. Gunawan is from home with caregivers. Planning to schedule care plan meeting with Mr. Peres spouse this week.  Will continue to follow for potential care management services. Will plan outreach to family as appropriate.   Raiford Noble, MSN, RN, BSN [place]  [facility], Healthy Communities RN Post- Acute Care Coordinator Direct Dial: [phone]

## 2022-11-25 DIAGNOSIS — Z515 Encounter for palliative care: Secondary | ICD-10-CM | POA: Diagnosis not present

## 2022-11-25 DIAGNOSIS — F5102 Adjustment insomnia: Secondary | ICD-10-CM | POA: Diagnosis not present

## 2022-11-25 DIAGNOSIS — M4626 Osteomyelitis of vertebra, lumbar region: Secondary | ICD-10-CM | POA: Diagnosis not present

## 2022-11-25 DIAGNOSIS — I10 Essential (primary) hypertension: Secondary | ICD-10-CM | POA: Diagnosis not present

## 2022-11-25 DIAGNOSIS — M25461 Effusion, right knee: Secondary | ICD-10-CM | POA: Diagnosis not present

## 2022-11-25 DIAGNOSIS — R600 Localized edema: Secondary | ICD-10-CM | POA: Diagnosis not present

## 2022-11-25 DIAGNOSIS — I480 Paroxysmal atrial fibrillation: Secondary | ICD-10-CM | POA: Diagnosis not present

## 2022-11-25 DIAGNOSIS — Z7689 Persons encountering health services in other specified circumstances: Secondary | ICD-10-CM | POA: Diagnosis not present

## 2022-11-26 DIAGNOSIS — I10 Essential (primary) hypertension: Secondary | ICD-10-CM | POA: Diagnosis not present

## 2022-11-26 DIAGNOSIS — F5102 Adjustment insomnia: Secondary | ICD-10-CM | POA: Diagnosis not present

## 2022-11-26 DIAGNOSIS — M4626 Osteomyelitis of vertebra, lumbar region: Secondary | ICD-10-CM | POA: Diagnosis not present

## 2022-11-26 DIAGNOSIS — I480 Paroxysmal atrial fibrillation: Secondary | ICD-10-CM | POA: Diagnosis not present

## 2022-11-26 DIAGNOSIS — G4700 Insomnia, unspecified: Secondary | ICD-10-CM | POA: Diagnosis not present

## 2022-11-26 DIAGNOSIS — Z7689 Persons encountering health services in other specified circumstances: Secondary | ICD-10-CM | POA: Diagnosis not present

## 2022-11-26 DIAGNOSIS — M25461 Effusion, right knee: Secondary | ICD-10-CM | POA: Diagnosis not present

## 2022-11-27 ENCOUNTER — Emergency Department (HOSPITAL_COMMUNITY): Payer: Medicare HMO

## 2022-11-27 ENCOUNTER — Other Ambulatory Visit: Payer: Self-pay

## 2022-11-27 ENCOUNTER — Emergency Department (HOSPITAL_COMMUNITY)
Admission: EM | Admit: 2022-11-27 | Discharge: 2022-11-28 | Disposition: A | Discharge status: Home / Self Care | Payer: Medicare HMO | Attending: Emergency Medicine | Admitting: Emergency Medicine

## 2022-11-27 DIAGNOSIS — Z7902 Long term (current) use of antithrombotics/antiplatelets: Secondary | ICD-10-CM | POA: Diagnosis not present

## 2022-11-27 DIAGNOSIS — Z515 Encounter for palliative care: Secondary | ICD-10-CM | POA: Diagnosis not present

## 2022-11-27 DIAGNOSIS — Z79899 Other long term (current) drug therapy: Secondary | ICD-10-CM | POA: Diagnosis not present

## 2022-11-27 DIAGNOSIS — Z7901 Long term (current) use of anticoagulants: Secondary | ICD-10-CM | POA: Diagnosis not present

## 2022-11-27 DIAGNOSIS — I110 Hypertensive heart disease with heart failure: Secondary | ICD-10-CM | POA: Diagnosis not present

## 2022-11-27 DIAGNOSIS — M4626 Osteomyelitis of vertebra, lumbar region: Secondary | ICD-10-CM | POA: Diagnosis not present

## 2022-11-27 DIAGNOSIS — I2510 Atherosclerotic heart disease of native coronary artery without angina pectoris: Secondary | ICD-10-CM | POA: Insufficient documentation

## 2022-11-27 DIAGNOSIS — M25561 Pain in right knee: Secondary | ICD-10-CM | POA: Diagnosis not present

## 2022-11-27 DIAGNOSIS — M25461 Effusion, right knee: Secondary | ICD-10-CM

## 2022-11-27 DIAGNOSIS — I5030 Unspecified diastolic (congestive) heart failure: Secondary | ICD-10-CM | POA: Diagnosis not present

## 2022-11-27 DIAGNOSIS — R9431 Abnormal electrocardiogram [ECG] [EKG]: Secondary | ICD-10-CM | POA: Diagnosis not present

## 2022-11-27 DIAGNOSIS — Z1331 Encounter for screening for depression: Secondary | ICD-10-CM | POA: Diagnosis not present

## 2022-11-27 DIAGNOSIS — R600 Localized edema: Secondary | ICD-10-CM | POA: Diagnosis not present

## 2022-11-27 DIAGNOSIS — M1711 Unilateral primary osteoarthritis, right knee: Secondary | ICD-10-CM | POA: Diagnosis not present

## 2022-11-27 LAB — SYNOVIAL CELL COUNT + DIFF, W/ CRYSTALS
Crystals, Fluid: NONE SEEN
Lymphocytes-Synovial Fld: 10 % (ref 0–20)
Neutrophil, Synovial: 90 % — ABNORMAL HIGH (ref 0–25)
WBC, Synovial: 12000 /mm3 — ABNORMAL HIGH (ref 0–200)

## 2022-11-27 MED ORDER — LIDOCAINE HCL (PF) 1 % IJ SOLN
5.0000 mL | Freq: Once | INTRAMUSCULAR | Status: AC
  Administered 2022-11-27: 5 mL

## 2022-11-27 MED ORDER — OXYCODONE HCL 5 MG PO TABS: 5.0000 mg | ORAL_TABLET | Freq: Four times a day (QID) | ORAL | 0 refills | Status: DC | PRN

## 2022-11-27 NOTE — ED Provider Notes (Signed)
[place] EMERGENCY DEPARTMENT AT [facility] Provider Note   CSN: [id number] Arrival date & time: 11/27/22  1827     History  Chief Complaint  Patient presents with   Knee Pain    Brett Riley is a 87 y.o. male.  Patient is an 87 year old male with a past medical history diastolic CHF, chronic hypoxic RF on 2 L, CAD, PAF on Eliquis, HTN, anemia, thrombocytopenia and recent hospitalization from 6/16-7/24 for MSSA bacteremia with vertebral osteomyelitis presenting to the emergency department with right knee pain.  Patient states that he has had on and off knee pain since his last hospitalization.  He states over the last 3 days he has had increased pain and swelling.  He states that the pain is worse with any movement and worse with ambulation.  He has not noticed any fevers.  He states it feels mildly warm to touch.  He denies any known history of arthritis.  He reports that he saw orthopedics when he was hospitalized for his bacteremia previously but was told that he could not have the fluid tested at that time due to the antibiotics he was on.  Of note on his nursing home records he did have a right lower extremity DVT study that was normal and had recent ESR that was significantly elevated and they are recommended to come to the emergency department for further evaluation.  The history is provided by the patient and the spouse.  Knee Pain      Home Medications Prior to Admission medications   Medication Sig Start Date End Date Taking? Authorizing Provider  oxyCODONE (ROXICODONE) 5 MG immediate release tablet Take 1 tablet (5 mg total) by mouth every 6 (six) hours as needed. 11/27/22  Yes Theresia Lo, [place] K, DO  acidophilus (RISAQUAD) CAPS capsule Take 1 capsule by mouth daily. 09/26/22   Marcelino Duster, MD  apixaban (ELIQUIS) 2.5 MG TABS tablet Take 1 tablet (2.5 mg total) by mouth 2 (two) times daily. 09/25/22   Marcelino Duster, MD  clopidogrel (PLAVIX) 75  MG tablet TAKE 1 TABLET BY MOUTH EVERY DAY WITH BREAKFAST 08/29/22   Dale Breckenridge, MD  diltiazem (CARDIZEM CD) 360 MG 24 hr capsule Take 1 capsule (360 mg total) by mouth daily. 08/09/22   Delfino Lovett, MD  feeding supplement (ENSURE ENLIVE / ENSURE PLUS) LIQD Take 237 mLs by mouth 2 (two) times daily between meals. Patient taking differently: Take 237 mLs by mouth 3 (three) times daily after meals. 08/09/22   Delfino Lovett, MD  hydrALAZINE (APRESOLINE) 25 MG tablet Take 25 mg by mouth every 6 (six) hours as needed (SBP >160).    [provider]  melatonin 5 MG TABS Take 0.5 tablets (2.5 mg total) by mouth at bedtime as needed. 09/25/22   Marcelino Duster, MD  metoprolol succinate (TOPROL-XL) 100 MG 24 hr tablet TAKE 1 TABLET BY MOUTH EVERY DAY WITH A MEAL 08/29/22   [facility], MD  Multiple Vitamins-Iron (MULTIVITAMINS WITH IRON) TABS Take 1 tablet by mouth daily.    [provider]  naloxone [facility]) nasal spray 4 mg/0.1 mL Place 4 mg into the nose 3 (three) times daily as needed (opioid overdose).    [provider]  pantoprazole (PROTONIX) 40 MG tablet Take 1 tablet (40 mg total) by mouth daily. 08/09/22   Delfino Lovett, MD  polyethylene glycol (MIRALAX / GLYCOLAX) 17 g packet Take 17 g by mouth daily.    [provider]  predniSONE (DELTASONE) 5  MG tablet Take 7 tablets (35 mg total) by mouth daily with breakfast for 5 days, THEN 6 tablets (30 mg total) daily with breakfast for 7 days, THEN 5 tablets (25 mg total) daily with breakfast for 7 days, THEN 4 tablets (20 mg total) daily with breakfast for 7 days, THEN 3 tablets (15 mg total) daily with breakfast for 7 days, THEN 2 tablets (10 mg total) daily with breakfast for 7 days, THEN 1 tablet (5 mg total) daily with breakfast for 7 days. 10/15/22 12/01/22  Almon Hercules, MD  rosuvastatin (CRESTOR) 10 MG tablet Take 1 tablet (10 mg total) by mouth daily. 02/14/22   Dale Festus, MD  senna (SENOKOT) 8.6 MG TABS  tablet Take 1 tablet by mouth daily.    [provider]  senna-docusate (SENOKOT-S) 8.6-50 MG tablet Take 2 tablets by mouth 2 (two) times daily between meals as needed for moderate constipation. 10/15/22   Almon Hercules, MD  sodium chloride (OCEAN) 0.65 % SOLN nasal spray Place 1 spray into both nostrils as needed for congestion. 08/09/22   Delfino Lovett, MD  sulfamethoxazole-trimethoprim (BACTRIM DS) 800-160 MG tablet Take 1 tablet by mouth 3 (three) times a week. Patient taking differently: Take 1 tablet by mouth every Monday, Wednesday, and Friday. 09/25/22   Marcelino Duster, MD  traMADol (ULTRAM) 50 MG tablet Take 50 mg by mouth every 8 (eight) hours as needed for moderate pain.    [provider]  traMADol HCl 100 MG TABS Take 100 mg by mouth every 12 (twelve) hours as needed (severe pain).    [provider]  vitamin B-12 (CYANOCOBALAMIN) 1000 MCG tablet Take 1,000 mcg by mouth daily.    [provider]      Allergies    Patient has no known allergies.    Review of Systems   Review of Systems  Physical Exam Updated Vital Signs BP 129/64   Pulse 73   Temp 98.2 F (36.8 C) (Temporal)   Resp 17   Ht 6' (1.829 m)   Wt 83.9 kg   SpO2 93%   BMI 25.09 kg/m  Physical Exam Vitals and nursing note reviewed.  Constitutional:      General: He is not in acute distress.    Appearance: Normal appearance.  HENT:     Head: Normocephalic and atraumatic.     Nose: Nose normal.     Mouth/Throat:     Mouth: Mucous membranes are moist.     Pharynx: Oropharynx is clear.  Eyes:     Extraocular Movements: Extraocular movements intact.     Conjunctiva/sclera: Conjunctivae normal.  Cardiovascular:     Rate and Rhythm: Normal rate.  Pulmonary:     Effort: Pulmonary effort is normal.  Musculoskeletal:     Cervical back: Normal range of motion.     Right lower leg: Edema (1+ to shin) present.     Left lower leg: Edema (1+ to shin) present.     Comments:  Palpable knee joint effusion to the right knee, minimal active or passive knee flexion secondary to pain, warmth to touch, no significant erythema  Skin:    General: Skin is warm and dry.  Neurological:     General: No focal deficit present.     Mental Status: He is alert and oriented to person, place, and time.  Psychiatric:        Mood and Affect: Mood normal.        Behavior: Behavior normal.  ED Results / Procedures / Treatments   Labs (all labs ordered are listed, but only abnormal results are displayed) Labs Reviewed  SYNOVIAL CELL COUNT + DIFF, W/ CRYSTALS - Abnormal; Notable for the following components:      Result Value   Color, Synovial RED (*)    Appearance-Synovial CLOUDY (*)    WBC, Synovial 12,000 (*)    Neutrophil, Synovial 90 (*)    All other components within normal limits  BODY FLUID CULTURE W GRAM STAIN  GLUCOSE, BODY FLUID OTHER            PROTEIN, BODY FLUID (OTHER)    EKG EKG Interpretation Date/Time:  Wednesday November 27 2022 18:32:28 EDT Ventricular Rate:  86 PR Interval:  169 QRS Duration:  91 QT Interval:  351 QTC Calculation: 420 R Axis:   17  Text Interpretation: Sinus rhythm Abnormal R-wave progression, early transition No significant change since last tracing Confirmed by Elayne Snare ([id number]) on 11/27/2022 7:47:35 PM  Radiology DG Knee Complete 4 Views Right  Result Date: 11/27/2022 CLINICAL DATA:  Several day history of right knee pain and swelling. No known injury. EXAM: RIGHT KNEE - COMPLETE 4 VIEW COMPARISON:  Right knee radiograph dated 08/27/2022 FINDINGS: There are no findings of fracture or dislocation. Large joint effusion. Mild tricompartmental degenerative changes of the knee. Soft tissues are unremarkable. IMPRESSION: 1. Large joint effusion. 2. Mild tricompartmental degenerative changes of the knee. Electronically Signed   By: Agustin Cree M.D.   On: 11/27/2022 20:08    Procedures .Joint  Aspiration/Arthrocentesis  Date/Time: 11/27/2022 8:48 PM  Performed by: Rexford Maus, DO Authorized by: Rexford Maus, DO   Consent:    Consent obtained:  Verbal   Consent given by:  Patient   Risks, benefits, and alternatives were discussed: yes     Risks discussed:  Incomplete drainage, pain, infection and bleeding   Alternatives discussed:  No treatment and delayed treatment Universal protocol:    Procedure explained and questions answered to patient or proxy's satisfaction: yes     Patient identity confirmed:  Verbally with patient Location:    Location:  Knee   Knee:  R knee Anesthesia:    Anesthesia method:  Local infiltration   Local anesthetic:  Lidocaine 1% w/o epi Procedure details:    Preparation: Patient was prepped and draped in usual sterile fashion     Needle gauge:  18 G   Ultrasound guidance: no     Approach:  Medial   Aspirate characteristics:  Blood-tinged and yellow   Steroid injected: no     Specimen collected: yes   Post-procedure details:    Dressing:  Adhesive bandage   Procedure completion:  Tolerated well, no immediate complications     Medications Ordered in ED Medications  lidocaine (PF) (XYLOCAINE) 1 % injection 5 mL (5 mLs Infiltration Given by Other 11/27/22 2154)    ED Course/ Medical Decision Making/ A&P Clinical Course as of 11/27/22 2314  Wed Nov 27, 2022  2259 Fluid consistent with inflammatory arthritis, consistent with severe tricompartmental osteoarthritis on xray. Recommended continued pain control, ice, elevation, and outpatient orthopedic evaluation. He is currently receiving PT at SNF facility and is safe to return. [VK]    Clinical Course User Index [VK] Rexford Maus, DO                                 Medical Decision Making  This patient presents to the ED with chief complaint(s) of knee pain with pertinent past medical history of CHF, chronic hypoxic respiratory failure on 2 L O2, CAD, A-fib on  Eliquis, hypertension, recent vertebral osteomyelitis on Bactrim which further complicates the presenting complaint. The complaint involves an extensive differential diagnosis and also carries with it a high risk of complications and morbidity.    The differential diagnosis includes septic joint, gouty arthritis, osteoarthritis, fracture or dislocation less likely as no reported trauma or falls  Additional history obtained: Additional history obtained from family Records reviewed previous admission documents and Nursing Home Documents  ED Course and Reassessment: On patient's arrival he is hemodynamically stable in no acute distress.  He does have significant knee joint effusion with decreased ROM secondary to pain.  The patient had x-ray performed by triage and will need arthrocentesis to further evaluate for septic joint.  He declined any additional pain medication and will be closely reassessed.  Independent labs interpretation:  The following labs were independently interpreted: synovial fluid consistent with inflammatory arthritis  Independent visualization of imaging: - I independently visualized the following imaging with scope of interpretation limited to determining acute life threatening conditions related to emergency care: R knee XR, which revealed tricompartmental osteoarthritis  Consultation: - Consulted or discussed management/test interpretation w/ external professional: N/A  Consideration for admission or further workup: Patient has no emergent conditions requiring admission or further work-up at this time and is stable for discharge home with primary care and orthopedics follow-up  Social Determinants of health: N/A    Amount and/or Complexity of Data Reviewed Labs: ordered. Radiology: ordered.  Risk Prescription drug management.          Final Clinical Impression(s) / ED Diagnoses Final diagnoses:  Effusion of right knee  Osteoarthritis of right knee,  unspecified osteoarthritis type    Rx / DC Orders ED Discharge Orders          Ordered    oxyCODONE (ROXICODONE) 5 MG immediate release tablet  Every 6 hours PRN        11/27/22 2312              Rexford Maus, DO 11/27/22 2314

## 2022-11-27 NOTE — ED Triage Notes (Signed)
Patient from [facility] for eval of R knee pain and swelling since Monday. No injury. Pain only with movement.

## 2022-11-27 NOTE — Discharge Instructions (Addendum)
You were seen in the emergency department for your knee pain.  Your workup showed no signs of infection in your knee but you do have severe arthritis in your knee.  We did send a culture of the fluid in your knee so if this comes back positive you will receive a call.  You can take Tylenol every 6 hours as needed for pain and I have given you some oxycodone to take as needed for breakthrough pain.  This can make you drowsy so do not take it before driving, working or operating heavy machinery and it can make you constipated so make sure that you are taking it with your stool softeners.  You can use an Ace wrap, ice your knee and keep it elevated to help with the swelling.  You should follow-up with your primary doctor and can follow-up with the orthopedic doctor as well to have your symptoms rechecked.  You should return to the emergency department if you have fevers, redness to your knee or any other new or concerning symptoms.

## 2022-11-28 DIAGNOSIS — M6281 Muscle weakness (generalized): Secondary | ICD-10-CM | POA: Diagnosis not present

## 2022-11-28 DIAGNOSIS — F5102 Adjustment insomnia: Secondary | ICD-10-CM | POA: Diagnosis not present

## 2022-11-28 DIAGNOSIS — M4620 Osteomyelitis of vertebra, site unspecified: Secondary | ICD-10-CM | POA: Diagnosis not present

## 2022-11-28 DIAGNOSIS — R278 Other lack of coordination: Secondary | ICD-10-CM | POA: Diagnosis not present

## 2022-11-28 DIAGNOSIS — Z7401 Bed confinement status: Secondary | ICD-10-CM | POA: Diagnosis not present

## 2022-11-28 DIAGNOSIS — E785 Hyperlipidemia, unspecified: Secondary | ICD-10-CM | POA: Diagnosis not present

## 2022-11-28 DIAGNOSIS — J90 Pleural effusion, not elsewhere classified: Secondary | ICD-10-CM | POA: Diagnosis not present

## 2022-11-28 DIAGNOSIS — M25569 Pain in unspecified knee: Secondary | ICD-10-CM | POA: Diagnosis not present

## 2022-11-28 DIAGNOSIS — M4626 Osteomyelitis of vertebra, lumbar region: Secondary | ICD-10-CM | POA: Diagnosis not present

## 2022-11-28 DIAGNOSIS — I5030 Unspecified diastolic (congestive) heart failure: Secondary | ICD-10-CM | POA: Diagnosis not present

## 2022-11-28 DIAGNOSIS — R2689 Other abnormalities of gait and mobility: Secondary | ICD-10-CM | POA: Diagnosis not present

## 2022-11-28 DIAGNOSIS — E872 Acidosis, unspecified: Secondary | ICD-10-CM | POA: Diagnosis not present

## 2022-11-28 DIAGNOSIS — I480 Paroxysmal atrial fibrillation: Secondary | ICD-10-CM | POA: Diagnosis not present

## 2022-11-28 DIAGNOSIS — Z7689 Persons encountering health services in other specified circumstances: Secondary | ICD-10-CM | POA: Diagnosis not present

## 2022-11-28 DIAGNOSIS — L89323 Pressure ulcer of left buttock, stage 3: Secondary | ICD-10-CM | POA: Diagnosis not present

## 2022-11-28 DIAGNOSIS — I4819 Other persistent atrial fibrillation: Secondary | ICD-10-CM | POA: Diagnosis not present

## 2022-11-28 DIAGNOSIS — N183 Chronic kidney disease, stage 3 unspecified: Secondary | ICD-10-CM | POA: Diagnosis not present

## 2022-11-28 DIAGNOSIS — R2681 Unsteadiness on feet: Secondary | ICD-10-CM | POA: Diagnosis not present

## 2022-11-28 DIAGNOSIS — I10 Essential (primary) hypertension: Secondary | ICD-10-CM | POA: Diagnosis not present

## 2022-11-28 DIAGNOSIS — D649 Anemia, unspecified: Secondary | ICD-10-CM | POA: Diagnosis not present

## 2022-11-28 DIAGNOSIS — M25461 Effusion, right knee: Secondary | ICD-10-CM | POA: Diagnosis not present

## 2022-11-28 DIAGNOSIS — I2584 Coronary atherosclerosis due to calcified coronary lesion: Secondary | ICD-10-CM | POA: Diagnosis not present

## 2022-11-28 DIAGNOSIS — N179 Acute kidney failure, unspecified: Secondary | ICD-10-CM | POA: Diagnosis not present

## 2022-11-28 DIAGNOSIS — I482 Chronic atrial fibrillation, unspecified: Secondary | ICD-10-CM | POA: Diagnosis not present

## 2022-11-28 DIAGNOSIS — E871 Hypo-osmolality and hyponatremia: Secondary | ICD-10-CM | POA: Diagnosis not present

## 2022-11-28 DIAGNOSIS — J9601 Acute respiratory failure with hypoxia: Secondary | ICD-10-CM | POA: Diagnosis not present

## 2022-11-28 DIAGNOSIS — G9341 Metabolic encephalopathy: Secondary | ICD-10-CM | POA: Diagnosis not present

## 2022-11-28 NOTE — ED Notes (Signed)
This paramedic attempted to call [facility] SN[facility] to give report on PT. I called three times and did not get a answer

## 2022-11-28 NOTE — ED Notes (Signed)
This paramedic was able to get in contact with LPN for pt at [facility] and gave report.

## 2022-11-29 LAB — GLUCOSE, BODY FLUID OTHER: Glucose, Body Fluid Other: 12 mg/dL

## 2022-11-29 LAB — PROTEIN, BODY FLUID (OTHER): Total Protein, Body Fluid Other: 3.9 g/dL

## 2022-12-01 LAB — BODY FLUID CULTURE W GRAM STAIN: Culture: NO GROWTH

## 2022-12-04 DIAGNOSIS — I10 Essential (primary) hypertension: Secondary | ICD-10-CM | POA: Diagnosis not present

## 2022-12-04 DIAGNOSIS — M25461 Effusion, right knee: Secondary | ICD-10-CM | POA: Diagnosis not present

## 2022-12-04 DIAGNOSIS — M4626 Osteomyelitis of vertebra, lumbar region: Secondary | ICD-10-CM | POA: Diagnosis not present

## 2022-12-04 DIAGNOSIS — D649 Anemia, unspecified: Secondary | ICD-10-CM | POA: Diagnosis not present

## 2022-12-04 DIAGNOSIS — G9341 Metabolic encephalopathy: Secondary | ICD-10-CM | POA: Diagnosis not present

## 2022-12-04 DIAGNOSIS — E785 Hyperlipidemia, unspecified: Secondary | ICD-10-CM | POA: Diagnosis not present

## 2022-12-04 DIAGNOSIS — L89323 Pressure ulcer of left buttock, stage 3: Secondary | ICD-10-CM | POA: Diagnosis not present

## 2022-12-04 DIAGNOSIS — F5102 Adjustment insomnia: Secondary | ICD-10-CM | POA: Diagnosis not present

## 2022-12-09 DIAGNOSIS — M25461 Effusion, right knee: Secondary | ICD-10-CM | POA: Diagnosis not present

## 2022-12-12 DIAGNOSIS — I10 Essential (primary) hypertension: Secondary | ICD-10-CM | POA: Diagnosis not present

## 2022-12-12 DIAGNOSIS — M25461 Effusion, right knee: Secondary | ICD-10-CM | POA: Diagnosis not present

## 2022-12-12 DIAGNOSIS — I480 Paroxysmal atrial fibrillation: Secondary | ICD-10-CM | POA: Diagnosis not present

## 2022-12-12 DIAGNOSIS — M4626 Osteomyelitis of vertebra, lumbar region: Secondary | ICD-10-CM | POA: Diagnosis not present

## 2022-12-16 DIAGNOSIS — Z7689 Persons encountering health services in other specified circumstances: Secondary | ICD-10-CM | POA: Diagnosis not present

## 2022-12-16 DIAGNOSIS — I10 Essential (primary) hypertension: Secondary | ICD-10-CM | POA: Diagnosis not present

## 2022-12-16 DIAGNOSIS — F5102 Adjustment insomnia: Secondary | ICD-10-CM | POA: Diagnosis not present

## 2022-12-16 DIAGNOSIS — I480 Paroxysmal atrial fibrillation: Secondary | ICD-10-CM | POA: Diagnosis not present

## 2022-12-16 DIAGNOSIS — M4626 Osteomyelitis of vertebra, lumbar region: Secondary | ICD-10-CM | POA: Diagnosis not present

## 2022-12-16 DIAGNOSIS — M25461 Effusion, right knee: Secondary | ICD-10-CM | POA: Diagnosis not present

## 2022-12-17 DIAGNOSIS — I5030 Unspecified diastolic (congestive) heart failure: Secondary | ICD-10-CM | POA: Diagnosis not present

## 2022-12-17 DIAGNOSIS — M4620 Osteomyelitis of vertebra, site unspecified: Secondary | ICD-10-CM | POA: Diagnosis not present

## 2022-12-17 DIAGNOSIS — I2584 Coronary atherosclerosis due to calcified coronary lesion: Secondary | ICD-10-CM | POA: Diagnosis not present

## 2022-12-17 DIAGNOSIS — N183 Chronic kidney disease, stage 3 unspecified: Secondary | ICD-10-CM | POA: Diagnosis not present

## 2022-12-17 DIAGNOSIS — J90 Pleural effusion, not elsewhere classified: Secondary | ICD-10-CM | POA: Diagnosis not present

## 2022-12-17 DIAGNOSIS — J9601 Acute respiratory failure with hypoxia: Secondary | ICD-10-CM | POA: Diagnosis not present

## 2022-12-24 ENCOUNTER — Telehealth (INDEPENDENT_AMBULATORY_CARE_PROVIDER_SITE_OTHER): Payer: Medicare HMO | Admitting: Internal Medicine

## 2022-12-24 DIAGNOSIS — N1832 Chronic kidney disease, stage 3b: Secondary | ICD-10-CM

## 2022-12-24 DIAGNOSIS — I723 Aneurysm of iliac artery: Secondary | ICD-10-CM

## 2022-12-24 DIAGNOSIS — I779 Disorder of arteries and arterioles, unspecified: Secondary | ICD-10-CM

## 2022-12-24 DIAGNOSIS — I719 Aortic aneurysm of unspecified site, without rupture: Secondary | ICD-10-CM | POA: Diagnosis not present

## 2022-12-24 DIAGNOSIS — M25461 Effusion, right knee: Secondary | ICD-10-CM | POA: Diagnosis not present

## 2022-12-24 DIAGNOSIS — D696 Thrombocytopenia, unspecified: Secondary | ICD-10-CM | POA: Diagnosis not present

## 2022-12-24 DIAGNOSIS — I4819 Other persistent atrial fibrillation: Secondary | ICD-10-CM

## 2022-12-24 DIAGNOSIS — D649 Anemia, unspecified: Secondary | ICD-10-CM

## 2022-12-24 DIAGNOSIS — I10 Essential (primary) hypertension: Secondary | ICD-10-CM

## 2022-12-24 DIAGNOSIS — E785 Hyperlipidemia, unspecified: Secondary | ICD-10-CM

## 2022-12-24 DIAGNOSIS — I700 Atherosclerosis of aorta: Secondary | ICD-10-CM | POA: Diagnosis not present

## 2022-12-24 DIAGNOSIS — I25118 Atherosclerotic heart disease of native coronary artery with other forms of angina pectoris: Secondary | ICD-10-CM | POA: Diagnosis not present

## 2022-12-24 NOTE — Progress Notes (Signed)
Patient ID: Brett Riley, male   DOB: 1935-03-11, 87 y.o.   MRN: [id number]   Virtual Visit via video Note  I connected with Brett Riley by a video enabled telemedicine application and verified that I am speaking with the correct person using two identifiers. Location patient: home Location provider: work  Persons participating in the virtual visit: patient, provider  The limitations, risks, security and privacy concerns of performing an evaluation and management service by video and the availability of in person appointments have bene discussed.  It has also been discussed with the patient that there may be a patient responsible charge related to this service. The patient expressed understanding and agreed to proceed.   Reason for visit: hospital follow up.   HPI: Previous admission 07/20/22 - 08/09/22 - back pain, aortic ulcer with repair 5/22.  Stay complicated by sepsis with MSSA bacteremia and L-spine osteomyelitis/abscess. Discharged on IV cefepime. Admitted 6/16 - 7/24 for MSSA bacteremia with vertebral osteomyelitis, AKI and respiratory failure.  Readmitted 10/11/22 - bilateral pneumonia and right flank burising/ecchymosis.  Was on doxycycline.  Treated with IV abx and IV lasix. Developed AKI and diuretics discontinued. Was discharged on prednisone taper and po bactrim and on doxycycline for completion of his treatment.  Evaluated in ER 9/25 - knee pain and swelling - joint effusion. S/p aspiration. Fluid consistent with inflammatory arthritis, consistent with severe tricompartmental osteoarthritis on xray. Recommended continued pain control, ice, elevation, and outpatient orthopedic evaluation. Has been at [facility]  Discharged home one week ago. He is immobile - unable to get up.  They have a hoyer lift at home. Also receiving home care - [facility] - 10-2:00 and 5-9:00. He denies any increased pain. Breathing stable.  Eating.  His oxygen level has been stable 94-96%.  Blood  pressures 120-130s/70-80s.  Discussed PT. Also discussed f/u with [facility].  Has f/u planned with ortho this week.    ROS: See pertinent positives and negatives per HPI.  Past Medical History:  Diagnosis Date   Cancer [facility])    skin cancer basal   Carotid arterial disease (HCC)    a. 02/2016 L CEA; b. 01/2017 U/S: patent LICA, 1-39% RICA.   Celiac artery stenosis (HCC)    Coronary artery disease    a. 01/2016 MV: mild apical/basal inferior, apical lateral, mid anterolateral, and mid inferolateral ischemia. EF 57%; b. 02/2016 Cath: LM 40ost, LAD 70p/m, 20d, D1 95 (small), LCX nl, RCA 32m, RPDA 90 (small), EF 55-65%-->med Rx. Rec CABG for recurrent symptoms.   Hearing loss    History of echocardiogram    a. 02/2016 Echo: EF 50-55%, no rwma, mild MR.   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   Osteoarthritis, hip, bilateral    Left > Right    Past Surgical History:  Procedure Laterality Date   AORTIC INTERVENTION N/A 07/24/2022   Procedure: AORTIC INTERVENTION;  Surgeon: Renford Dills, MD;  Location: [facility] INVASIVE CV LAB;  Service: Cardiovascular;  Laterality: N/A;   CARDIAC CATHETERIZATION N/A 01/19/2016   Procedure: Left Heart Cath and Coronary Angiography;  Surgeon: Iran Ouch, MD;  Location: [facility] INVASIVE CV LAB;  Service: Cardiovascular;  Laterality: N/A;   COLONOSCOPY     CYSTOSCOPY/URETEROSCOPY/HOLMIUM LASER/STENT PLACEMENT Right 04/13/2021   Procedure: CYSTOSCOPY/URETEROSCOPY/HOLMIUM LASER/STENT PLACEMENT;  Surgeon: Sondra Come, MD;  Location: [facility] ORS;  Service: Urology;  Laterality: Right;  ENDARTERECTOMY Left 02/15/2016   Procedure: ENDARTERECTOMY CAROTID;  Surgeon: Annice Needy, MD;  Location: [facility] ORS;  Service: Vascular;  Laterality: Left;   RIGHT HEART CATH N/A 09/19/2022   Procedure: RIGHT HEART CATH;  Surgeon: Yvonne Kendall, MD;  Location: [facility] INVASIVE CV LAB;  Service:  Cardiovascular;  Laterality: N/A;   TEE WITHOUT CARDIOVERSION N/A 08/05/2022   Procedure: TRANSESOPHAGEAL ECHOCARDIOGRAM;  Surgeon: Debbe Odea, MD;  Location: [facility] ORS;  Service: Cardiovascular;  Laterality: N/A;    Family History  Problem Relation Age of Onset   Stroke Mother    Heart disease Father        MI   Heart attack Father    Breast cancer Sister    Colon cancer Neg Hx    Prostate cancer Neg Hx     SOCIAL HX: reviewed   Current Outpatient Medications:    furosemide (LASIX) 20 MG tablet, Take 20 mg by mouth every Monday, Wednesday, and Friday., Disp: , Rfl:    hydrALAZINE (APRESOLINE) 10 MG tablet, Take 10 mg by mouth 3 (three) times daily., Disp: , Rfl:    acidophilus (RISAQUAD) CAPS capsule, Take 1 capsule by mouth daily., Disp: 30 capsule, Rfl: 0   apixaban (ELIQUIS) 2.5 MG TABS tablet, Take 1 tablet (2.5 mg total) by mouth 2 (two) times daily., Disp: 30 tablet, Rfl: 2   clopidogrel (PLAVIX) 75 MG tablet, TAKE 1 TABLET BY MOUTH EVERY DAY WITH BREAKFAST, Disp: 90 tablet, Rfl: 2   diltiazem (CARDIZEM CD) 360 MG 24 hr capsule, Take 1 capsule (360 mg total) by mouth daily., Disp: , Rfl:    metoprolol succinate (TOPROL-XL) 100 MG 24 hr tablet, TAKE 1 TABLET BY MOUTH EVERY DAY WITH A MEAL, Disp: 90 tablet, Rfl: 2   Multiple Vitamins-Iron (MULTIVITAMINS WITH IRON) TABS, Take 1 tablet by mouth daily., Disp: , Rfl:    naloxone (NARCAN) nasal spray 4 mg/0.1 mL, Place 4 mg into the nose 3 (three) times daily as needed (opioid overdose). (Patient not taking: Reported on 12/24/2022), Disp: , Rfl:    pantoprazole (PROTONIX) 40 MG tablet, Take 1 tablet (40 mg total) by mouth daily., Disp: , Rfl:    polyethylene glycol (MIRALAX / GLYCOLAX) 17 g packet, Take 17 g by mouth daily., Disp: , Rfl:    rosuvastatin (CRESTOR) 10 MG tablet, Take 1 tablet (10 mg total) by mouth daily., Disp: 90 tablet, Rfl: 3   sodium chloride (OCEAN) 0.65 % SOLN nasal spray, Place 1 spray into both nostrils as  needed for congestion., Disp: , Rfl: 0   sulfamethoxazole-trimethoprim (BACTRIM DS) 800-160 MG tablet, Take 1 tablet by mouth 3 (three) times a week. (Patient taking differently: Take 1 tablet by mouth every Monday, Wednesday, and Friday.), Disp: 30 tablet, Rfl: 0  EXAM:  VITALS per patient if applicable: 129/69, pulse ox 95%  GENERAL: alert, oriented, appears in no acute distress.  Lying in bed.   HEENT: atraumatic, conjunttiva clear, no obvious abnormalities on inspection of external nose and ears  NECK: normal movements of the head and neck  LUNGS: on inspection no signs of respiratory distress, breathing rate appears normal, no obvious gross SOB, gasping or wheezing  CV: no obvious cyanosis  PSYCH/NEURO: pleasant and cooperative, no obvious depression or anxiety, speech and thought processing grossly intact  ASSESSMENT AND PLAN:  Discussed the following assessment and plan:  Problem List Items Addressed This Visit     Anemia    Follow [facility]bc.       Aneurysm  artery, iliac common (HCC)    Reevaluation [facility] 01/18/22 - recommended f/u every other year.       Relevant Medications   hydrALAZINE (APRESOLINE) 10 MG tablet   furosemide (LASIX) 20 MG tablet   Aortic atherosclerosis (HCC)    Continue crestor.        Relevant Medications   hydrALAZINE (APRESOLINE) 10 MG tablet   furosemide (LASIX) 20 MG tablet   Carotid artery disease (HCC)    F/u 01/18/22 - carotid duplex today shows stable 1 to 39% right ICA stenosis with some progression of his left ICA stenosis now into the 40 to 59% range. Continue aspirin, Plavix, and Crestor. Follow-up - 12 months.       Relevant Medications   hydrALAZINE (APRESOLINE) 10 MG tablet   furosemide (LASIX) 20 MG tablet   Coronary artery disease    Continues on plavix, eliquis , metoprolol and crestor.  No chest pain.  Breathing stable.       Relevant Medications   hydrALAZINE (APRESOLINE) 10 MG tablet   furosemide (LASIX) 20 MG tablet    Effusion of right knee    Recently evaluated in ER as outlined.  Evaluated in ER 9/25 - knee pain and swelling - joint effusion. S/p aspiration. Fluid consistent with inflammatory arthritis, consistent with severe tricompartmental osteoarthritis on xray. Recommended continued pain control, ice, elevation, and outpatient orthopedic evaluation.      Hyperlipidemia    On crestor.  Low cholesterol diet and exercise.  Follow lipid panel and liver function tests.        Relevant Medications   hydrALAZINE (APRESOLINE) 10 MG tablet   furosemide (LASIX) 20 MG tablet   Hypertension    Blood pressure as outlined. Per review, currently on hydralazine, metoprolol and diltiazem.  Follow pressures.  Follow metabolic panel. Per note, lasix listed.  Continue to monitor blood pressures.  Check metabolic panel.       Relevant Medications   hydrALAZINE (APRESOLINE) 10 MG tablet   furosemide (LASIX) 20 MG tablet   Penetrating atherosclerotic ulcer of aorta (HCC)    Previous admission 07/20/22 - 08/09/22 - back pain, aortic ulcer with repair 5/22. Discussed f/u with [facility].       Relevant Medications   hydrALAZINE (APRESOLINE) 10 MG tablet   furosemide (LASIX) 20 MG tablet   Persistent atrial fibrillation (HCC)    Continues on metoprolol and eliquis.        Relevant Medications   hydrALAZINE (APRESOLINE) 10 MG tablet   furosemide (LASIX) 20 MG tablet   Stage 3 chronic kidney disease (HCC)    Worsening renal function during recent hospitalization. Developed AKI.  Diuretics discontinued.  Renal function improved.  Last GFR documented 38.  Needs f/u met b.  Avoid antiinflammatory medication.  Follow.       Thrombocytopenia (HCC) - Primary    Saw hematology.  Recommended f/u 2-3x/year.  Follow cbc.        No follow-ups on file.   I discussed the assessment and treatment plan with the patient. The patient was provided an opportunity to ask questions and all were answered. The patient agreed with the  plan and demonstrated an understanding of the instructions.   The patient was advised to call back or seek an in-person evaluation if the symptoms worsen or if the condition fails to improve as anticipated.    Dale [place], MD

## 2022-12-26 ENCOUNTER — Other Ambulatory Visit
Admission: RE | Admit: 2022-12-26 | Discharge: 2022-12-26 | Disposition: A | Discharge status: Home / Self Care | Payer: Medicare HMO | Source: Ambulatory Visit | Attending: Orthopedic Surgery | Admitting: Orthopedic Surgery

## 2022-12-26 DIAGNOSIS — G8929 Other chronic pain: Secondary | ICD-10-CM | POA: Diagnosis not present

## 2022-12-26 DIAGNOSIS — M25561 Pain in right knee: Secondary | ICD-10-CM | POA: Diagnosis not present

## 2022-12-26 LAB — SYNOVIAL CELL COUNT + DIFF, W/ CRYSTALS
Crystals, Fluid: NONE SEEN
Eosinophils-Synovial: 0 %
Lymphocytes-Synovial Fld: 37 %
Monocyte-Macrophage-Synovial Fluid: 1 %
Neutrophil, Synovial: 62 %
WBC, Synovial: 150 /mm3 (ref 0–200)

## 2022-12-28 ENCOUNTER — Telehealth: Payer: Self-pay | Admitting: Family Medicine

## 2022-12-28 NOTE — Telephone Encounter (Signed)
I received a call from Lora Havens, RN with a home health agency. She had been contacted by Mr. Kue's wife. The family had found him to have a BP of 187/144 (left arm) and 190/82 (right arm) on routine testing today. After an hour, the BP remained 170/109. He has a history of hypertension and is currently managed on diltiazem 360 mg daily, hydralazine 10 mg TID, and metoprolol succinate 100 mg daily. He has had recent issues with a right knee effusion seen in ED on 11/27/2022 and by Dr. Allena Katz (orthopedics) on 10/24. He had aspiration of the effusion on both occasions. Reportedly, the knee is more red and swollen today than it was two days ago. I reviewed the report from the recent aspiration and culture. The WBC count was normal, where it had been elevated a month ago. His culture is negative so far.  I advised that the patient could take an additional dose of hydralazine 10 mg po now. I strongly recommend he be seen in the ED in light of the finding of worsening swelling of the right knee and the hypertensive urgency. In spite of the normal aspirate studies from 2 days ago, it is possible that an infection was introduced as a complication of the procedure.  Loyola Mast, MD

## 2022-12-29 ENCOUNTER — Telehealth: Payer: Self-pay | Admitting: Internal Medicine

## 2022-12-29 ENCOUNTER — Encounter: Payer: Self-pay | Admitting: Internal Medicine

## 2022-12-29 DIAGNOSIS — I723 Aneurysm of iliac artery: Secondary | ICD-10-CM

## 2022-12-29 DIAGNOSIS — N1832 Chronic kidney disease, stage 3b: Secondary | ICD-10-CM

## 2022-12-29 DIAGNOSIS — M79604 Pain in right leg: Secondary | ICD-10-CM

## 2022-12-29 DIAGNOSIS — M25461 Effusion, right knee: Secondary | ICD-10-CM

## 2022-12-29 NOTE — Assessment & Plan Note (Signed)
Worsening renal function during recent hospitalization. Developed AKI.  Diuretics discontinued.  Renal function improved.  Last GFR documented 38.  Needs f/u met b.  Avoid antiinflammatory medication.  Follow.

## 2022-12-29 NOTE — Telephone Encounter (Signed)
Recently had hospital follow up with Brett Riley.  [facility].  My understanding is also receiving home health services.  Please confirm if this is correct.  If so, would like met b checked if possible.  Also, he needs a f/u appt with [facility].  He is due his regular f/u (regarding his carotids, etc), but also f/u from recent hospitalization with aortic ulcer with repair.  Also, I received a message from on call physician that his blood pressure was elevated and knee more swollen.  Was advised ER evaluation.  Need update.

## 2022-12-29 NOTE — Assessment & Plan Note (Signed)
F/u 01/18/22 - carotid duplex today shows stable 1 to 39% right ICA stenosis with some progression of his left ICA stenosis now into the 40 to 59% range. Continue aspirin, Plavix, and Crestor. Follow-up - 12 months.  

## 2022-12-29 NOTE — Assessment & Plan Note (Signed)
Previous admission 07/20/22 - 08/09/22 - back pain, aortic ulcer with repair 5/22. Discussed f/u with [facility].

## 2022-12-29 NOTE — Assessment & Plan Note (Signed)
Continues on plavix, eliquis , metoprolol and crestor.  No chest pain.  Breathing stable.

## 2022-12-29 NOTE — Assessment & Plan Note (Signed)
Continues on metoprolol and eliquis.

## 2022-12-29 NOTE — Assessment & Plan Note (Signed)
Saw hematology.  Recommended f/u 2-3x/year.  Follow cbc.

## 2022-12-29 NOTE — Assessment & Plan Note (Signed)
Reevaluation AVVS 01/18/22 - recommended f/u every other year.  

## 2022-12-29 NOTE — Assessment & Plan Note (Signed)
Continue crestor 

## 2022-12-29 NOTE — Assessment & Plan Note (Signed)
On crestor.  Low cholesterol diet and exercise.  Follow lipid panel and liver function tests.

## 2022-12-29 NOTE — Assessment & Plan Note (Signed)
Blood pressure as outlined. Per review, currently on hydralazine, metoprolol and diltiazem.  Follow pressures.  Follow metabolic panel. Per note, lasix listed.  Continue to monitor blood pressures.  Check metabolic panel.

## 2022-12-29 NOTE — Assessment & Plan Note (Signed)
Recently evaluated in ER as outlined.  Evaluated in ER 9/25 - knee pain and swelling - joint effusion. S/p aspiration. Fluid consistent with inflammatory arthritis, consistent with severe tricompartmental osteoarthritis on xray. Recommended continued pain control, ice, elevation, and outpatient orthopedic evaluation.

## 2022-12-29 NOTE — Assessment & Plan Note (Signed)
Follow cbc.  

## 2022-12-30 LAB — BODY FLUID CULTURE W GRAM STAIN: Culture: NO GROWTH

## 2022-12-30 NOTE — Telephone Encounter (Signed)
FYI- received update from wife. Ortho recommended CT scan of his knee. They are setting this up. Also recommended tylenol 1000 mg TID. Wife just wanted to give an update.

## 2022-12-30 NOTE — Telephone Encounter (Signed)
Patient has an appointment with Dr Wyn Quaker on 11/19. They did not take pt to ED. BP came down. Most recent reading was 138/78. Knee is still red and swollen. Wife is calling ortho this AM. Will call home health to give order for BMP to be drawn. Wife is going to let me know if any problems.

## 2022-12-30 NOTE — Telephone Encounter (Signed)
Agree if knee is swollen, etc - needs f/u asap.  Let me know if I need to do anything more.

## 2022-12-31 DIAGNOSIS — S51812D Laceration without foreign body of left forearm, subsequent encounter: Secondary | ICD-10-CM | POA: Diagnosis not present

## 2022-12-31 DIAGNOSIS — L89153 Pressure ulcer of sacral region, stage 3: Secondary | ICD-10-CM | POA: Diagnosis not present

## 2023-01-01 NOTE — Telephone Encounter (Signed)
Spoke with PT. Advised to call ortho regarding knee issues and continuing therapy. CCM referral placed for transportation needs. Wife is aware.

## 2023-01-01 NOTE — Telephone Encounter (Signed)
Physical Therapy called and said patient is having problems with his right knee. It's been swollen and Physical therapy wanted provider to know. His number is [phone] [facility]. Physical Therapy asked could the provider call them and let them know an update on the patient's right knee. Family has asked for physical therapy to wait til his knee gets a little better.

## 2023-01-01 NOTE — Addendum Note (Signed)
Addended by: Rita Ohara D on: 01/01/2023 02:56 PM   Modules accepted: Orders

## 2023-01-02 ENCOUNTER — Telehealth: Payer: Self-pay | Admitting: *Deleted

## 2023-01-02 NOTE — Telephone Encounter (Signed)
Telephone encounter was:  Successful.  01/02/2023 Name: SAMUELA DERINGER MRN: [id number] DOB: March 23, 1935  SAMUELE SUTCLIFFE is a 87 y.o. year old male who is a primary care patient of Dale [place], MD . The community resource team was consulted for assistance with Transportation Needs   Care guide performed the following interventions: Patient provided with information about care guide support team and interviewed to confirm resource needs. 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 The patient has been provided with needed resources.  Dione Booze [facility]  Population Health Careguide  Direct Dial: [phone] Website: [email or web address]

## 2023-01-07 ENCOUNTER — Other Ambulatory Visit: Payer: Self-pay | Admitting: Orthopedic Surgery

## 2023-01-07 DIAGNOSIS — G8929 Other chronic pain: Secondary | ICD-10-CM

## 2023-01-08 ENCOUNTER — Other Ambulatory Visit: Payer: Self-pay | Admitting: Internal Medicine

## 2023-01-08 ENCOUNTER — Other Ambulatory Visit: Payer: Self-pay

## 2023-01-08 MED ORDER — FUROSEMIDE 20 MG PO TABS: 20.0000 mg | ORAL_TABLET | ORAL | 2 refills | Status: DC

## 2023-01-08 NOTE — Telephone Encounter (Signed)
Please clarify medications 

## 2023-01-09 ENCOUNTER — Telehealth: Payer: Self-pay | Admitting: Internal Medicine

## 2023-01-09 NOTE — Telephone Encounter (Signed)
Brett Riley from [place] home health called stating the pt missed a PT visit on 11/1

## 2023-01-09 NOTE — Telephone Encounter (Signed)
Noted  

## 2023-01-09 NOTE — Telephone Encounter (Signed)
FYI- pt is holding on PT until knee issues better.

## 2023-01-10 ENCOUNTER — Telehealth (INDEPENDENT_AMBULATORY_CARE_PROVIDER_SITE_OTHER): Payer: Medicare HMO | Admitting: Internal Medicine

## 2023-01-10 ENCOUNTER — Telehealth: Payer: Self-pay | Admitting: Internal Medicine

## 2023-01-10 DIAGNOSIS — D649 Anemia, unspecified: Secondary | ICD-10-CM

## 2023-01-10 DIAGNOSIS — I700 Atherosclerosis of aorta: Secondary | ICD-10-CM | POA: Diagnosis not present

## 2023-01-10 DIAGNOSIS — E871 Hypo-osmolality and hyponatremia: Secondary | ICD-10-CM | POA: Diagnosis not present

## 2023-01-10 DIAGNOSIS — N1832 Chronic kidney disease, stage 3b: Secondary | ICD-10-CM | POA: Diagnosis not present

## 2023-01-10 DIAGNOSIS — I723 Aneurysm of iliac artery: Secondary | ICD-10-CM | POA: Diagnosis not present

## 2023-01-10 DIAGNOSIS — I779 Disorder of arteries and arterioles, unspecified: Secondary | ICD-10-CM

## 2023-01-10 DIAGNOSIS — D696 Thrombocytopenia, unspecified: Secondary | ICD-10-CM

## 2023-01-10 DIAGNOSIS — I5032 Chronic diastolic (congestive) heart failure: Secondary | ICD-10-CM

## 2023-01-10 DIAGNOSIS — I25118 Atherosclerotic heart disease of native coronary artery with other forms of angina pectoris: Secondary | ICD-10-CM

## 2023-01-10 DIAGNOSIS — E785 Hyperlipidemia, unspecified: Secondary | ICD-10-CM | POA: Diagnosis not present

## 2023-01-10 DIAGNOSIS — I4819 Other persistent atrial fibrillation: Secondary | ICD-10-CM | POA: Diagnosis not present

## 2023-01-10 DIAGNOSIS — G479 Sleep disorder, unspecified: Secondary | ICD-10-CM

## 2023-01-10 DIAGNOSIS — I10 Essential (primary) hypertension: Secondary | ICD-10-CM

## 2023-01-10 DIAGNOSIS — M25461 Effusion, right knee: Secondary | ICD-10-CM | POA: Diagnosis not present

## 2023-01-10 MED ORDER — MIRTAZAPINE 7.5 MG PO TABS: ORAL_TABLET | ORAL | 1 refills | Status: DC

## 2023-01-10 NOTE — Telephone Encounter (Signed)
Wife aware. Will coordinate with home health about BMP

## 2023-01-10 NOTE — Telephone Encounter (Signed)
Please call Mr and Ms Wilcock that I am going to send in remeron 7.5mg  take 1/2 - 1 tablet q hs.  Needs met b checked in 2 weeks.

## 2023-01-10 NOTE — Progress Notes (Unsigned)
Patient ID: Brett Riley, male   DOB: 1935-06-14, 87 y.o.   MRN: [id number]   Virtual Visit via video Note  I connected with [name] by a video enabled telemedicine application and verified that I am speaking with the correct person using two identifiers. Location patient: home Location provider: work  Persons participating in the virtual visit: patient, provider  The limitations, risks, security and privacy concerns of performing an evaluation and management service by video and the availability of in person appointments have bene discussed.  It has also been discussed with the patient that there may be a patient responsible charge related to this service. The patient expressed understanding and agreed to proceed.   Reason for visit: work in appt  HPI: Work in to discuss sleep issues.  He is accompanied by his wife.  History obtained from both of them. Has had issues with sleep for a while. Had an issue when in SNF after hospitalization. Nocturia 4-5x/night. Is trying to drink more fluids. Does sleep some through the day.  Concern regarding day and nights confused. Taking melatonin. He is taking tylenol to help with his knee.  Seeing ortho.  Waiting for CT scan. Eating.  No nausea or vomiting reported. No abdominal pain reported. Feels needs something to help him relax.  Denies depression, but does report getting frustrated - not being able to do things he used to do.  Decreased appetite.  Decreased po intake. Receiving OT.  PT on hold until knee issues sorted out.    ROS: See pertinent positives and negatives per HPI.  Past Medical History:  Diagnosis Date   Cancer [facility])    skin cancer basal   Carotid arterial disease (HCC)    a. 02/2016 L CEA; b. 01/2017 U/S: patent LICA, 1-39% RICA.   Celiac artery stenosis (HCC)    Coronary artery disease    a. 01/2016 MV: mild apical/basal inferior, apical lateral, mid anterolateral, and mid inferolateral ischemia. EF 57%; b. 02/2016 Cath: LM  40ost, LAD 70p/m, 20d, D1 95 (small), LCX nl, RCA 23m, RPDA 90 (small), EF 55-65%-->med Rx. Rec CABG for recurrent symptoms.   Hearing loss    History of echocardiogram    a. 02/2016 Echo: EF 50-55%, no rwma, mild MR.   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   Osteoarthritis, hip, bilateral    Left > Right    Past Surgical History:  Procedure Laterality Date   AORTIC INTERVENTION N/A 07/24/2022   Procedure: AORTIC INTERVENTION;  Surgeon: Renford Dills, MD;  Location: [facility]ASIVE CV [facility];  Service: Cardiovascular;  Laterality: N/A;   CARDIAC CATHETERIZATION N/A 01/19/2016   Procedure: Left Heart Cath and Coronary Angiography;  Surgeon: Iran Ouch, MD;  Location: [facility];  Service: Cardiovascular;  Laterality: N/A;   COLONOSCOPY     CYSTOSCOPY/URETEROSCOPY/HOLMIUM LASER/STENT PLACEMENT Right 04/13/2021   Procedure: CYSTOSCOPY/URETEROSCOPY/HOLMIUM LASER/STENT PLACEMENT;  Surgeon: Sondra Come, MD;  Location: [facility] OR[facility];  Service: Urology;  Laterality: Right;   ENDARTERECTOMY Left 02/15/2016   Procedure: ENDARTERECTOMY CAROTID;  Surgeon: Annice Needy, MD;  Location: [facility] OR[facility];  Service: Vascular;  Laterality: Left;   RIGHT HEART CATH N/A 09/19/2022   Procedure: RIGHT HEART CATH;  Surgeon: Yvonne Kendall, MD;  Location: [facility];  Service: Cardiovascular;  Laterality: N/A;   TEE WITHOUT CARDIOVERSION N/A 08/05/2022   Procedure: TRANSESOPHAGEAL ECHOCARDIOGRAM;  Surgeon: Debbe Odea, MD;  Location: [facility];  Service: Cardiovascular;  Laterality: N/A;    Family History  Problem Relation Age of Onset   Stroke Mother    Heart disease Father        MI   Heart attack Father    Breast cancer Sister    Colon cancer Neg Hx    Prostate cancer Neg Hx     SOCIAL HX: reviewed.    Current Outpatient Medications:    acidophilus (RISAQUAD) CAPS capsule, Take 1 capsule by mouth  daily., Disp: 30 capsule, Rfl: 0   apixaban (ELIQUIS) 2.5 MG TABS tablet, TAKE ONE TABLET BY MOUTH TWICE DAILY (DVT PREVENTION), Disp: 180 tablet, Rfl: 1   clopidogrel (PLAVIX) 75 MG tablet, TAKE 1 TABLET BY MOUTH EVERY DAY WITH BREAKFAST, Disp: 90 tablet, Rfl: 2   diltiazem (CARDIZEM CD) 360 MG 24 hr capsule, Take 1 capsule (360 mg total) by mouth daily., Disp: , Rfl:    furosemide (LASIX) 20 MG tablet, Take 1 tablet (20 mg total) by mouth every Monday, Wednesday, and Friday., Disp: 30 tablet, Rfl: 2   hydrALAZINE (APRESOLINE) 10 MG tablet, Take 10 mg by mouth 3 (three) times daily., Disp: , Rfl:    Lactobacillus Acid-Pectin (ACIDOPHILUS/PECTIN) CAPS, TAKE 1 CAPSULE BY MOUTH IN THE MORNING, Disp: 90 capsule, Rfl: 3   metoprolol succinate (TOPROL-XL) 100 MG 24 hr tablet, TAKE 1 TABLET BY MOUTH EVERY DAY WITH A MEAL, Disp: 90 tablet, Rfl: 2   mirtazapine (REMERON) 7.5 MG tablet, 1/2 - 1 whole tablet q hs, Disp: 30 tablet, Rfl: 1   Multiple Vitamins-Iron (MULTIVITAMINS WITH IRON) TABS, Take 1 tablet by mouth daily., Disp: , Rfl:    naloxone (NARCAN) nasal spray 4 mg/0.1 mL, Place 4 mg into the nose 3 (three) times daily as needed (opioid overdose). (Patient not taking: Reported on 12/24/2022), Disp: , Rfl:    pantoprazole (PROTONIX) 40 MG tablet, TAKE ONE TABLET BY MOUTH IN THE MORNING, Disp: 90 tablet, Rfl: 1   polyethylene glycol (MIRALAX / GLYCOLAX) 17 g packet, Take 17 g by mouth daily., Disp: , Rfl:    rosuvastatin (CRESTOR) 10 MG tablet, Take 1 tablet (10 mg total) by mouth daily., Disp: 90 tablet, Rfl: 3   sodium chloride (OCEAN) 0.65 % SOLN nasal spray, Place 1 spray into both nostrils as needed for congestion., Disp: , Rfl: 0   sulfamethoxazole-trimethoprim (BACTRIM DS) 800-160 MG tablet, Take 1 tablet by mouth 3 (three) times a week. (Patient taking differently: Take 1 tablet by mouth every Monday, Wednesday, and Friday.), Disp: 30 tablet, Rfl: 0  EXAM:  VITALS per patient if applicable:  140/70, pulse ox 96-98%  GENERAL: alert, oriented, appears well and in no acute distress  HEENT: atraumatic, conjunttiva clear, no obvious abnormalities on inspection of external nose and ears  NECK: normal movements of the head and neck  LUNGS: on inspection no signs of respiratory distress, breathing rate appears normal, no obvious gross SOB, gasping or wheezing  CV: no obvious cyanosis  PSYCH/NEURO: pleasant and cooperative, no obvious depression or anxiety, speech and thought processing grossly intact  ASSESSMENT AND PLAN:  Discussed the following assessment and plan:  Problem List Items Addressed This Visit     Anemia - Primary    Follow cbc.       Aneurysm artery, iliac common (HCC)    Reevaluation AVVS 01/18/22 - recommended f/u every other year.       Aortic atherosclerosis (HCC)  Continue crestor.        CAD (coronary artery disease)    Previous cath - 2 vessel disease.  Risk factor modification.  Continue crestor.       Carotid artery disease (HCC)    F/u 01/18/22 - carotid duplex today shows stable 1 to 39% right ICA stenosis with some progression of his left ICA stenosis now into the 40 to 59% range. Continue aspirin, Plavix, and Crestor. Follow-up - 12 months.       Chronic diastolic CHF (congestive heart failure) (HCC)    Continues on metoprolol, hydralazine and documented furosemide.  Breathing stable.  Does not appear to be volume overloaded.  Follow metabolic panel as outlined.       CKD (chronic kidney disease), stage III (HCC)    Avoid antiinflammatory medication.  Will need to follow.  Plan for home health to check met b in a couple of weeks.      Effusion of right knee    Seeing ortho.  Planning for CT scan as outlined.       Essential hypertension, benign     Follow pressures.  Follow metabolic panel.  Continue current dose of metoprolol. Per documentation - remains on hydralazine/furosemide.       Hyperlipidemia    On crestor. Follow  lipid panel and liver function tests.        Hyponatremia    Slight decrease sodium last check.  With plans to start remeron, will need met b check in 2 weeks.  Follow.       Persistent atrial fibrillation (HCC)    Continues on metoprolol and eliquis.        Sleeping difficulty    Discussed sleep issues are probably multifactorial.  Does feel needs something to help with sleep.  Days and nights appear to be confused.  Try to keep awake during the day.  Consider possible sleep apnea.  With decreased appetite and frustration, trial of remeron - low dose.  Titrate up if needed.  Follow.       Thrombocytopenia (HCC)    Saw hematology.  Recommended f/u 2-3x/year.  Follow cbc.        Return if symptoms worsen or fail to improve.   I discussed the assessment and treatment plan with the patient. The patient was provided an opportunity to ask questions and all were answered. The patient agreed with the plan and demonstrated an understanding of the instructions.   The patient was advised to call back or seek an in-person evaluation if the symptoms worsen or if the condition fails to improve as anticipated.   Dale , MD

## 2023-01-11 DIAGNOSIS — I5030 Unspecified diastolic (congestive) heart failure: Secondary | ICD-10-CM | POA: Diagnosis not present

## 2023-01-11 DIAGNOSIS — J9601 Acute respiratory failure with hypoxia: Secondary | ICD-10-CM | POA: Diagnosis not present

## 2023-01-11 DIAGNOSIS — N183 Chronic kidney disease, stage 3 unspecified: Secondary | ICD-10-CM | POA: Diagnosis not present

## 2023-01-11 DIAGNOSIS — I2584 Coronary atherosclerosis due to calcified coronary lesion: Secondary | ICD-10-CM | POA: Diagnosis not present

## 2023-01-11 DIAGNOSIS — M4620 Osteomyelitis of vertebra, site unspecified: Secondary | ICD-10-CM | POA: Diagnosis not present

## 2023-01-11 DIAGNOSIS — J90 Pleural effusion, not elsewhere classified: Secondary | ICD-10-CM | POA: Diagnosis not present

## 2023-01-12 ENCOUNTER — Encounter: Payer: Self-pay | Admitting: Internal Medicine

## 2023-01-12 DIAGNOSIS — G479 Sleep disorder, unspecified: Secondary | ICD-10-CM | POA: Insufficient documentation

## 2023-01-12 NOTE — Assessment & Plan Note (Addendum)
On crestor.  Follow lipid panel and liver function tests.   

## 2023-01-12 NOTE — Assessment & Plan Note (Signed)
Follow pressures.  Follow metabolic panel.  Continue current dose of metoprolol. Per documentation - remains on hydralazine/furosemide.

## 2023-01-12 NOTE — Assessment & Plan Note (Signed)
Slight decrease sodium last check.  With plans to start remeron, will need met b check in 2 weeks.  Follow.

## 2023-01-12 NOTE — Assessment & Plan Note (Signed)
Discussed sleep issues are probably multifactorial.  Does feel needs something to help with sleep.  Days and nights appear to be confused.  Try to keep awake during the day.  Consider possible sleep apnea.  With decreased appetite and frustration, trial of remeron - low dose.  Titrate up if needed.  Follow.

## 2023-01-12 NOTE — Assessment & Plan Note (Signed)
F/u 01/18/22 - carotid duplex today shows stable 1 to 39% right ICA stenosis with some progression of his left ICA stenosis now into the 40 to 59% range. Continue aspirin, Plavix, and Crestor. Follow-up - 12 months.  

## 2023-01-12 NOTE — Assessment & Plan Note (Signed)
Previous cath - 2 vessel disease.  Risk factor modification.  Continue crestor.

## 2023-01-12 NOTE — Assessment & Plan Note (Signed)
Reevaluation AVVS 01/18/22 - recommended f/u every other year.  

## 2023-01-12 NOTE — Assessment & Plan Note (Signed)
Saw hematology.  Recommended f/u 2-3x/year.  Follow cbc.

## 2023-01-12 NOTE — Assessment & Plan Note (Signed)
Avoid antiinflammatory medication.  Will need to follow.  Plan for home health to check met b in a couple of weeks.

## 2023-01-12 NOTE — Assessment & Plan Note (Signed)
Seeing ortho.  Planning for CT scan as outlined.

## 2023-01-12 NOTE — Assessment & Plan Note (Signed)
Continues on metoprolol and eliquis.

## 2023-01-12 NOTE — Assessment & Plan Note (Signed)
Continue crestor 

## 2023-01-12 NOTE — Assessment & Plan Note (Signed)
Follow cbc.  

## 2023-01-12 NOTE — Assessment & Plan Note (Signed)
Continues on metoprolol, hydralazine and documented furosemide.  Breathing stable.  Does not appear to be volume overloaded.  Follow metabolic panel as outlined.

## 2023-01-14 ENCOUNTER — Telehealth: Payer: Self-pay

## 2023-01-14 ENCOUNTER — Telehealth: Payer: Self-pay | Admitting: Internal Medicine

## 2023-01-14 ENCOUNTER — Emergency Department: Payer: Medicare HMO

## 2023-01-14 ENCOUNTER — Encounter: Payer: Self-pay | Admitting: Family Medicine

## 2023-01-14 ENCOUNTER — Inpatient Hospital Stay: Payer: Medicare HMO

## 2023-01-14 ENCOUNTER — Inpatient Hospital Stay
Admission: EM | Admit: 2023-01-14 | Discharge: 2023-01-23 | DRG: 064 | Disposition: A | Discharge status: Home Health | Payer: Medicare HMO | Attending: Hospitalist | Admitting: Hospitalist

## 2023-01-14 ENCOUNTER — Other Ambulatory Visit: Payer: Self-pay

## 2023-01-14 DIAGNOSIS — G9341 Metabolic encephalopathy: Secondary | ICD-10-CM | POA: Diagnosis present

## 2023-01-14 DIAGNOSIS — M25461 Effusion, right knee: Secondary | ICD-10-CM | POA: Diagnosis not present

## 2023-01-14 DIAGNOSIS — G8311 Monoplegia of lower limb affecting right dominant side: Secondary | ICD-10-CM | POA: Diagnosis present

## 2023-01-14 DIAGNOSIS — Z85828 Personal history of other malignant neoplasm of skin: Secondary | ICD-10-CM | POA: Diagnosis not present

## 2023-01-14 DIAGNOSIS — Z66 Do not resuscitate: Secondary | ICD-10-CM | POA: Diagnosis present

## 2023-01-14 DIAGNOSIS — I6523 Occlusion and stenosis of bilateral carotid arteries: Secondary | ICD-10-CM | POA: Diagnosis not present

## 2023-01-14 DIAGNOSIS — G9340 Encephalopathy, unspecified: Secondary | ICD-10-CM | POA: Diagnosis not present

## 2023-01-14 DIAGNOSIS — I773 Arterial fibromuscular dysplasia: Secondary | ICD-10-CM | POA: Diagnosis present

## 2023-01-14 DIAGNOSIS — J470 Bronchiectasis with acute lower respiratory infection: Secondary | ICD-10-CM | POA: Diagnosis present

## 2023-01-14 DIAGNOSIS — E785 Hyperlipidemia, unspecified: Secondary | ICD-10-CM | POA: Diagnosis present

## 2023-01-14 DIAGNOSIS — Z7901 Long term (current) use of anticoagulants: Secondary | ICD-10-CM | POA: Diagnosis not present

## 2023-01-14 DIAGNOSIS — R471 Dysarthria and anarthria: Secondary | ICD-10-CM | POA: Diagnosis present

## 2023-01-14 DIAGNOSIS — I2510 Atherosclerotic heart disease of native coronary artery without angina pectoris: Secondary | ICD-10-CM | POA: Diagnosis present

## 2023-01-14 DIAGNOSIS — R4182 Altered mental status, unspecified: Secondary | ICD-10-CM | POA: Diagnosis not present

## 2023-01-14 DIAGNOSIS — Z951 Presence of aortocoronary bypass graft: Secondary | ICD-10-CM | POA: Diagnosis not present

## 2023-01-14 DIAGNOSIS — I10 Essential (primary) hypertension: Secondary | ICD-10-CM | POA: Diagnosis present

## 2023-01-14 DIAGNOSIS — Z8679 Personal history of other diseases of the circulatory system: Secondary | ICD-10-CM

## 2023-01-14 DIAGNOSIS — N183 Chronic kidney disease, stage 3 unspecified: Secondary | ICD-10-CM | POA: Diagnosis not present

## 2023-01-14 DIAGNOSIS — I5030 Unspecified diastolic (congestive) heart failure: Secondary | ICD-10-CM | POA: Diagnosis present

## 2023-01-14 DIAGNOSIS — J90 Pleural effusion, not elsewhere classified: Secondary | ICD-10-CM | POA: Diagnosis not present

## 2023-01-14 DIAGNOSIS — R29898 Other symptoms and signs involving the musculoskeletal system: Secondary | ICD-10-CM | POA: Diagnosis not present

## 2023-01-14 DIAGNOSIS — I708 Atherosclerosis of other arteries: Secondary | ICD-10-CM | POA: Diagnosis not present

## 2023-01-14 DIAGNOSIS — Z955 Presence of coronary angioplasty implant and graft: Secondary | ICD-10-CM

## 2023-01-14 DIAGNOSIS — H9190 Unspecified hearing loss, unspecified ear: Secondary | ICD-10-CM | POA: Diagnosis present

## 2023-01-14 DIAGNOSIS — L89312 Pressure ulcer of right buttock, stage 2: Secondary | ICD-10-CM | POA: Diagnosis not present

## 2023-01-14 DIAGNOSIS — J189 Pneumonia, unspecified organism: Secondary | ICD-10-CM | POA: Diagnosis present

## 2023-01-14 DIAGNOSIS — Z23 Encounter for immunization: Secondary | ICD-10-CM

## 2023-01-14 DIAGNOSIS — M25561 Pain in right knee: Secondary | ICD-10-CM | POA: Diagnosis present

## 2023-01-14 DIAGNOSIS — I6503 Occlusion and stenosis of bilateral vertebral arteries: Secondary | ICD-10-CM | POA: Diagnosis not present

## 2023-01-14 DIAGNOSIS — I4819 Other persistent atrial fibrillation: Secondary | ICD-10-CM | POA: Diagnosis present

## 2023-01-14 DIAGNOSIS — I5032 Chronic diastolic (congestive) heart failure: Secondary | ICD-10-CM | POA: Diagnosis present

## 2023-01-14 DIAGNOSIS — D631 Anemia in chronic kidney disease: Secondary | ICD-10-CM | POA: Diagnosis not present

## 2023-01-14 DIAGNOSIS — F05 Delirium due to known physiological condition: Secondary | ICD-10-CM | POA: Diagnosis present

## 2023-01-14 DIAGNOSIS — Z743 Need for continuous supervision: Secondary | ICD-10-CM | POA: Diagnosis not present

## 2023-01-14 DIAGNOSIS — I2584 Coronary atherosclerosis due to calcified coronary lesion: Secondary | ICD-10-CM | POA: Diagnosis not present

## 2023-01-14 DIAGNOSIS — E7800 Pure hypercholesterolemia, unspecified: Secondary | ICD-10-CM | POA: Diagnosis present

## 2023-01-14 DIAGNOSIS — R410 Disorientation, unspecified: Secondary | ICD-10-CM | POA: Diagnosis not present

## 2023-01-14 DIAGNOSIS — M160 Bilateral primary osteoarthritis of hip: Secondary | ICD-10-CM | POA: Diagnosis present

## 2023-01-14 DIAGNOSIS — I639 Cerebral infarction, unspecified: Secondary | ICD-10-CM | POA: Diagnosis present

## 2023-01-14 DIAGNOSIS — I130 Hypertensive heart and chronic kidney disease with heart failure and stage 1 through stage 4 chronic kidney disease, or unspecified chronic kidney disease: Secondary | ICD-10-CM | POA: Diagnosis present

## 2023-01-14 DIAGNOSIS — I6389 Other cerebral infarction: Principal | ICD-10-CM | POA: Diagnosis present

## 2023-01-14 DIAGNOSIS — Z7902 Long term (current) use of antithrombotics/antiplatelets: Secondary | ICD-10-CM

## 2023-01-14 DIAGNOSIS — Z823 Family history of stroke: Secondary | ICD-10-CM

## 2023-01-14 DIAGNOSIS — I482 Chronic atrial fibrillation, unspecified: Secondary | ICD-10-CM

## 2023-01-14 DIAGNOSIS — G8929 Other chronic pain: Secondary | ICD-10-CM | POA: Diagnosis present

## 2023-01-14 DIAGNOSIS — Z7401 Bed confinement status: Secondary | ICD-10-CM | POA: Diagnosis not present

## 2023-01-14 DIAGNOSIS — N179 Acute kidney failure, unspecified: Secondary | ICD-10-CM | POA: Diagnosis not present

## 2023-01-14 DIAGNOSIS — Z8249 Family history of ischemic heart disease and other diseases of the circulatory system: Secondary | ICD-10-CM | POA: Diagnosis not present

## 2023-01-14 DIAGNOSIS — R29706 NIHSS score 6: Secondary | ICD-10-CM | POA: Diagnosis present

## 2023-01-14 DIAGNOSIS — J9601 Acute respiratory failure with hypoxia: Secondary | ICD-10-CM | POA: Diagnosis not present

## 2023-01-14 DIAGNOSIS — M7981 Nontraumatic hematoma of soft tissue: Secondary | ICD-10-CM | POA: Diagnosis present

## 2023-01-14 DIAGNOSIS — I70201 Unspecified atherosclerosis of native arteries of extremities, right leg: Secondary | ICD-10-CM | POA: Diagnosis not present

## 2023-01-14 DIAGNOSIS — M4620 Osteomyelitis of vertebra, site unspecified: Secondary | ICD-10-CM | POA: Diagnosis not present

## 2023-01-14 DIAGNOSIS — G8314 Monoplegia of lower limb affecting left nondominant side: Secondary | ICD-10-CM | POA: Diagnosis present

## 2023-01-14 DIAGNOSIS — R5381 Other malaise: Secondary | ICD-10-CM | POA: Diagnosis present

## 2023-01-14 DIAGNOSIS — I6782 Cerebral ischemia: Secondary | ICD-10-CM | POA: Diagnosis not present

## 2023-01-14 DIAGNOSIS — R918 Other nonspecific abnormal finding of lung field: Secondary | ICD-10-CM | POA: Diagnosis not present

## 2023-01-14 DIAGNOSIS — R4781 Slurred speech: Secondary | ICD-10-CM | POA: Diagnosis present

## 2023-01-14 DIAGNOSIS — J849 Interstitial pulmonary disease, unspecified: Secondary | ICD-10-CM | POA: Diagnosis not present

## 2023-01-14 DIAGNOSIS — J479 Bronchiectasis, uncomplicated: Secondary | ICD-10-CM | POA: Diagnosis not present

## 2023-01-14 DIAGNOSIS — Z79899 Other long term (current) drug therapy: Secondary | ICD-10-CM

## 2023-01-14 DIAGNOSIS — R531 Weakness: Secondary | ICD-10-CM | POA: Diagnosis not present

## 2023-01-14 DIAGNOSIS — G319 Degenerative disease of nervous system, unspecified: Secondary | ICD-10-CM | POA: Diagnosis not present

## 2023-01-14 DIAGNOSIS — N182 Chronic kidney disease, stage 2 (mild): Secondary | ICD-10-CM | POA: Diagnosis present

## 2023-01-14 LAB — CBC
HCT: 34.9 % — ABNORMAL LOW (ref 39.0–52.0)
Hemoglobin: 11.1 g/dL — ABNORMAL LOW (ref 13.0–17.0)
MCH: 31.5 pg (ref 26.0–34.0)
MCHC: 31.8 g/dL (ref 30.0–36.0)
MCV: 99.1 fL (ref 80.0–100.0)
Platelets: 250 10*3/uL (ref 150–400)
RBC: 3.52 MIL/uL — ABNORMAL LOW (ref 4.22–5.81)
RDW: 15.3 % (ref 11.5–15.5)
WBC: 8.5 10*3/uL (ref 4.0–10.5)
nRBC: 0 % (ref 0.0–0.2)

## 2023-01-14 LAB — COMPREHENSIVE METABOLIC PANEL WITH GFR
ALT: 12 U/L (ref 0–44)
AST: 17 U/L (ref 15–41)
Albumin: 4 g/dL (ref 3.5–5.0)
Alkaline Phosphatase: 76 U/L (ref 38–126)
Anion gap: 10 (ref 5–15)
BUN: 25 mg/dL — ABNORMAL HIGH (ref 8–23)
CO2: 23 mmol/L (ref 22–32)
Calcium: 10.7 mg/dL — ABNORMAL HIGH (ref 8.9–10.3)
Chloride: 104 mmol/L (ref 98–111)
Creatinine, Ser: 1.05 mg/dL (ref 0.61–1.24)
GFR, Estimated: >60 mL/min
Glucose, Bld: 106 mg/dL — ABNORMAL HIGH (ref 70–99)
Potassium: 3.8 mmol/L (ref 3.5–5.1)
Sodium: 137 mmol/L (ref 135–145)
Total Bilirubin: 0.4 mg/dL
Total Protein: 7 g/dL (ref 6.5–8.1)

## 2023-01-14 LAB — URINALYSIS, ROUTINE W REFLEX MICROSCOPIC
Bacteria, UA: NONE SEEN
Bilirubin Urine: NEGATIVE
Glucose, UA: NEGATIVE mg/dL
Ketones, ur: NEGATIVE mg/dL
Leukocytes,Ua: NEGATIVE
Nitrite: NEGATIVE
Protein, ur: NEGATIVE mg/dL
Specific Gravity, Urine: 1.019 (ref 1.005–1.030)
Squamous Epithelial / HPF: 0 /HPF (ref 0–5)
pH: 5 (ref 5.0–8.0)

## 2023-01-14 LAB — TROPONIN I (HIGH SENSITIVITY): Troponin I (High Sensitivity): 25 ng/L — ABNORMAL HIGH

## 2023-01-14 LAB — PROTIME-INR
INR: 1.3 — ABNORMAL HIGH (ref 0.8–1.2)
Prothrombin Time: 16 s — ABNORMAL HIGH (ref 11.4–15.2)

## 2023-01-14 MED ORDER — ACETAMINOPHEN 160 MG/5ML PO SOLN: 650.0000 mg | ORAL | Status: DC | PRN

## 2023-01-14 MED ORDER — ACETAMINOPHEN 325 MG PO TABS
650.0000 mg | ORAL_TABLET | ORAL | Status: DC | PRN
  Administered 2023-01-18 – 2023-01-20 (×2): 650 mg via ORAL
  Filled 2023-01-14 (×2): qty 2

## 2023-01-14 MED ORDER — IOHEXOL 350 MG/ML SOLN
75.0000 mL | Freq: Once | INTRAVENOUS | Status: AC | PRN
  Administered 2023-01-14: 75 mL via INTRAVENOUS

## 2023-01-14 MED ORDER — DILTIAZEM HCL ER COATED BEADS 180 MG PO CP24
360.0000 mg | ORAL_CAPSULE | Freq: Every day | ORAL | Status: DC
  Administered 2023-01-14 – 2023-01-23 (×10): 360 mg via ORAL
  Filled 2023-01-14 (×10): qty 2

## 2023-01-14 MED ORDER — APIXABAN 2.5 MG PO TABS
2.5000 mg | ORAL_TABLET | Freq: Two times a day (BID) | ORAL | Status: DC
  Administered 2023-01-14 – 2023-01-17 (×7): 2.5 mg via ORAL
  Filled 2023-01-14 (×7): qty 1

## 2023-01-14 MED ORDER — CLOPIDOGREL BISULFATE 75 MG PO TABS
75.0000 mg | ORAL_TABLET | Freq: Every day | ORAL | Status: DC
  Administered 2023-01-14 – 2023-01-23 (×9): 75 mg via ORAL
  Filled 2023-01-14 (×10): qty 1

## 2023-01-14 MED ORDER — SODIUM CHLORIDE 0.9 % IV SOLN: INTRAVENOUS | Status: DC

## 2023-01-14 MED ORDER — STROKE: EARLY STAGES OF RECOVERY BOOK: Freq: Once | Status: AC

## 2023-01-14 MED ORDER — LABETALOL HCL 5 MG/ML IV SOLN: 10.0000 mg | INTRAVENOUS | Status: DC | PRN

## 2023-01-14 MED ORDER — ACETAMINOPHEN 325 MG RE SUPP: 650.0000 mg | RECTAL | Status: DC | PRN

## 2023-01-14 MED ORDER — PANTOPRAZOLE SODIUM 40 MG PO TBEC
40.0000 mg | DELAYED_RELEASE_TABLET | Freq: Every morning | ORAL | Status: DC
  Administered 2023-01-15 – 2023-01-23 (×9): 40 mg via ORAL
  Filled 2023-01-14 (×9): qty 1

## 2023-01-14 NOTE — ED Provider Notes (Signed)
[facility] Provider Note    Event Date/Time   First MD Initiated Contact with Patient 01/14/23 1352     (approximate)   History   Altered Mental Status   HPI  Brett Riley is a 87 y.o. male presents to the ER for evaluation of confusion change in speech and behavior starting last night.  No report of any fevers.  Patient denies any pain or discomfort.  Wife is suspicious for possible stroke or UTI.  He denies any numbness or tingling.  No new medications.     Physical Exam   Triage Vital Signs: ED Triage Vitals [01/14/23 1112]  Encounter Vitals Group     BP (!) 167/105     Systolic BP Percentile      Diastolic BP Percentile      Pulse Rate 93     Resp 17     Temp 98.4 F (36.9 C)     Temp Source Oral     SpO2 97 %     Weight 184 lb 15.5 oz (83.9 kg)     Height 6' (1.829 m)     Head Circumference      Peak Flow      Pain Score 0     Pain Loc      Pain Education      Exclude from Growth Chart     Most recent vital signs: Vitals:   01/14/23 1415 01/14/23 1500  BP:  (!) 164/93  Pulse: 95 99  Resp: 18 17  Temp:    SpO2: 96% 98%     Constitutional: Alert  Eyes: Conjunctivae are normal.  Head: Atraumatic. Nose: No congestion/rhinnorhea. Mouth/Throat: Mucous membranes are moist.   Neck: Painless ROM.  Cardiovascular:   Good peripheral circulation. Respiratory: Normal respiratory effort.  No retractions.  Gastrointestinal: Soft and nontender.  Musculoskeletal:  no deformity Neurologic: No facial droop.  Sensation intact bilaterally upper extremity strength intact weak bilateral lowers reportedly chronic. Skin:  Skin is warm, dry and intact. No rash noted. Psychiatric: Mood and affect are normal. Speech and behavior are normal.    ED Results / Procedures / Treatments   Labs (all labs ordered are listed, but only abnormal results are displayed) Labs Reviewed  COMPREHENSIVE METABOLIC PANEL - Abnormal; Notable for the  following components:      Result Value   Glucose, Bld 106 (*)    BUN 25 (*)    Calcium 10.7 (*)    All other components within normal limits  CBC - Abnormal; Notable for the following components:   RBC 3.52 (*)    Hemoglobin 11.1 (*)    HCT 34.9 (*)    All other components within normal limits  PROTIME-INR - Abnormal; Notable for the following components:   Prothrombin Time 16.0 (*)    INR 1.3 (*)    All other components within normal limits  URINALYSIS, ROUTINE W REFLEX MICROSCOPIC - Abnormal; Notable for the following components:   Color, Urine YELLOW (*)    APPearance CLEAR (*)    Hgb urine dipstick SMALL (*)    All other components within normal limits  CBG MONITORING, ED     EKG  ED ECG REPORT I, Willy Eddy, the attending physician, personally viewed and interpreted this ECG.   Date: 01/14/2023  EKG Time: 11:18  Rate: 95  Rhythm: afib  Axis: normal  Intervals:normal  ST&T Change: no stemi    RADIOLOGY Please see ED Course for my  review and interpretation.  I personally reviewed all radiographic images ordered to evaluate for the above acute complaints and reviewed radiology reports and findings.  These findings were personally discussed with the patient.  Please see medical record for radiology report.    PROCEDURES:  Critical Care performed: No  Procedures   MEDICATIONS ORDERED IN ED: Medications - No data to display   IMPRESSION / MDM / ASSESSMENT AND PLAN / ED COURSE  I reviewed the triage vital signs and the nursing notes.                              Differential diagnosis includes, but is not limited to, Dehydration, sepsis, pna, uti, hypoglycemia, cva, drug effect, withdrawal, encephalitis  Patient presenting to the ER for evaluation of symptoms as described above.  Based on symptoms, risk factors and considered above differential, this presenting complaint could reflect a potentially life-threatening illness therefore the patient  will be placed on continuous pulse oximetry and telemetry for monitoring.  Laboratory evaluation will be sent to evaluate for the above complaints.      Clinical Course as of 01/14/23 1526  Tue Jan 14, 2023  1422 Chest x-ray my review and interpretation without evidence of pneumothorax. [PR]  1523 Urinalysis without evidence of infection.  Given his presentation with altered mental status slurred speech new neurodeficits concerning for CVA will consult hospitalist for admission.  I have ordered MRI. [PR]    Clinical Course User Index [PR] Willy Eddy, MD     FINAL CLINICAL IMPRESSION(S) / ED DIAGNOSES   Final diagnoses:  Confusion  Slurred speech     Rx / DC Orders   ED Discharge Orders     None        Note:  This document was prepared using Dragon voice recognition software and may include unintentional dictation errors.    Willy Eddy, MD 01/14/23 [phone]

## 2023-01-14 NOTE — Assessment & Plan Note (Signed)
2D ECHO 09/2022 w/ EF 50-55%  Euvolemic  Monitor

## 2023-01-14 NOTE — Assessment & Plan Note (Signed)
Rate controlled at present Continue Eliquis, diltiazem

## 2023-01-14 NOTE — H&P (Addendum)
History and Physical    Patient: Brett Riley Brett Riley DOB: [birth year] DOA: 01/14/2023 DOS: the patient was seen and examined on 01/14/2023 PCP: Dale [place], MD  Patient coming from: Home  Chief Complaint:  Chief Complaint  Patient presents with   Altered Mental Status   HPI: Brett Riley is a 87 y.o. male with medical history significant of diastolic CHF, CAD, PAF on Eliquis, HTN presenting with suspected CVA.  History primarily from wife in the setting of dysarthria.  Per report, patient with noted slurred speech last night by the wife.  Also with worsening confusion.  Patient usually oriented x 4 per wife.  No chest pain shortness of breath.  No nausea or vomiting.  Also with lethargy.  Slurred speech and confusion continued into today.  Positive decreased p.o. intake.  No fevers or chills.  Baseline atrial fibrillation and CAD.  Has been compliant with home medications including Eliquis and Plavix.  Noted admission August 2024 for commune acquired pneumonia also had vertebral osteomyelitis with completion of antibiotics October 24, 2022.  No cough.  Has had chronic lower extremity weakness since episode of osteomyelitis. Presented to the ER afebrile, pressures 160s to 170s over 90s to 100s.  Satting well on room air.  White count 8.5, hemoglobin 11.1, platelets 250.  Creatinine 1.05.  Urinalysis stable.  CT head stable.  EKG atrial fibrillation with nonspecific ST and T wave changes. Review of Systems: As mentioned in the history of present illness. All other systems reviewed and are negative. Past Medical History:  Diagnosis Date   Cancer [facility])    skin cancer basal   Carotid arterial disease (HCC)    a. 02/2016 L CEA; b. 01/2017 U/S: patent LICA, 1-39% RICA.   Celiac artery stenosis (HCC)    Coronary artery disease    a. 01/2016 MV: mild apical/basal inferior, apical lateral, mid anterolateral, and mid inferolateral ischemia. EF 57%; b. 02/2016 Cath: LM 40ost, LAD 70p/m,  20d, D1 95 (small), LCX nl, RCA 53m, RPDA 90 (small), EF 55-65%-->med Rx. Rec CABG for recurrent symptoms.   Hearing loss    History of echocardiogram    a. 02/2016 Echo: EF 50-55%, no rwma, mild MR.   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   Osteoarthritis, hip, bilateral    Left > Right   Past Surgical History:  Procedure Laterality Date   AORTIC INTERVENTION N/A 07/24/2022   Procedure: AORTIC INTERVENTION;  Surgeon: Renford Dills, MD;  Location: [facility] INVASIVE CV LAB;  Service: Cardiovascular;  Laterality: N/A;   CARDIAC CATHETERIZATION N/A 01/19/2016   Procedure: Left Heart Cath and Coronary Angiography;  Surgeon: Iran Ouch, MD;  Location: [facility] INVASIVE CV LAB;  Service: Cardiovascular;  Laterality: N/A;   COLONOSCOPY     CYSTOSCOPY/URETEROSCOPY/HOLMIUM LASER/STENT PLACEMENT Right 04/13/2021   Procedure: CYSTOSCOPY/URETEROSCOPY/HOLMIUM LASER/STENT PLACEMENT;  Surgeon: Sondra Come, MD;  Location: [facility] ORS;  Service: Urology;  Laterality: Right;   ENDARTERECTOMY Left 02/15/2016   Procedure: ENDARTERECTOMY CAROTID;  Surgeon: Annice Needy, MD;  Location: [facility] ORS;  Service: Vascular;  Laterality: Left;   RIGHT HEART CATH N/A 09/19/2022   Procedure: RIGHT HEART CATH;  Surgeon: Yvonne Kendall, MD;  Location: [facility] INVASIVE CV LAB;  Service: Cardiovascular;  Laterality: N/A;   TEE WITHOUT CARDIOVERSION N/A 08/05/2022   Procedure: TRANSESOPHAGEAL ECHOCARDIOGRAM;  Surgeon: Debbe Odea, MD;  Location: [facility] ORS;  Service: Cardiovascular;  Laterality: N/A;   Social History:  reports that he has never smoked. He has never used smokeless tobacco. He reports that he does not drink alcohol and does not use drugs.  No Known Allergies  Family History  Problem Relation Age of Onset   Stroke Mother    Heart disease Father        MI   Heart attack Father    Breast cancer Sister    Colon cancer Neg Hx     Prostate cancer Neg Hx     Prior to Admission medications   Medication Sig Start Date End Date Taking? Authorizing Provider  acidophilus (RISAQUAD) CAPS capsule Take 1 capsule by mouth daily. 09/26/22   Marcelino Duster, MD  apixaban (ELIQUIS) 2.5 MG TABS tablet TAKE ONE TABLET BY MOUTH TWICE DAILY (DVT PREVENTION) 01/08/23   Dale [place], MD  clopidogrel (PLAVIX) 75 MG tablet TAKE 1 TABLET BY MOUTH EVERY DAY WITH BREAKFAST 08/29/22   Dale [place], MD  diltiazem (CARDIZEM CD) 360 MG 24 hr capsule Take 1 capsule (360 mg total) by mouth daily. 08/09/22   Delfino Lovett, MD  furosemide (LASIX) 20 MG tablet Take 1 tablet (20 mg total) by mouth every Monday, Wednesday, and Friday. 01/08/23   Dale [place], MD  hydrALAZINE (APRESOLINE) 10 MG tablet Take 10 mg by mouth 3 (three) times daily. 12/16/22   [provider]  Lactobacillus Acid-Pectin (ACIDOPHILUS/PECTIN) CAPS TAKE 1 CAPSULE BY MOUTH IN THE MORNING 01/08/23   Dale [place], MD  metoprolol succinate (TOPROL-XL) 100 MG 24 hr tablet TAKE 1 TABLET BY MOUTH EVERY DAY WITH A MEAL 08/29/22   Dale [place], MD  mirtazapine (REMERON) 7.5 MG tablet 1/2 - 1 whole tablet q hs 01/10/23   Dale [place], MD  Multiple Vitamins-Iron (MULTIVITAMINS WITH IRON) TABS Take 1 tablet by mouth daily.    [provider]  naloxone [facility]) nasal spray 4 mg/0.1 mL Place 4 mg into the nose 3 (three) times daily as needed (opioid overdose). Patient not taking: Reported on 12/24/2022    [provider]  pantoprazole (PROTONIX) 40 MG tablet TAKE ONE TABLET BY MOUTH IN THE MORNING 01/08/23   Dale Vazquez, MD  polyethylene glycol (MIRALAX / GLYCOLAX) 17 g packet Take 17 g by mouth daily.    [provider]  rosuvastatin (CRESTOR) 10 MG tablet Take 1 tablet (10 mg total) by mouth daily. 02/14/22   Dale [place], MD  sodium chloride (OCEAN) 0.65 % SOLN nasal spray Place 1 spray into both nostrils as needed for congestion. 08/09/22    Delfino Lovett, MD  sulfamethoxazole-trimethoprim (BACTRIM DS) 800-160 MG tablet Take 1 tablet by mouth 3 (three) times a week. Patient taking differently: Take 1 tablet by mouth every Monday, Wednesday, and Friday. 09/25/22   Marcelino Duster, MD    Physical Exam: Vitals:   01/14/23 1400 01/14/23 1415 01/14/23 1500 01/14/23 1643  BP: (!) 170/106  (!) 164/93 (!) 177/99  Pulse:  95 99 97  Resp:  18 17 17   Temp:    98.2 F (36.8 C)  TempSrc:      SpO2:  96% 98% 100%  Weight:      Height:       Physical Exam Constitutional:      Appearance: He is obese.  HENT:     Head: Normocephalic and atraumatic.     Mouth/Throat:     Mouth: Mucous membranes are moist.  Eyes:     Pupils: Pupils are equal, round, and reactive to  light.  Cardiovascular:     Rate and Rhythm: Normal rate and regular rhythm.  Pulmonary:     Effort: Pulmonary effort is normal.  Abdominal:     General: Bowel sounds are normal.  Musculoskeletal:     Comments: Chronic bilateral LE weakness  + mild RUE weakness vs. LUE   Skin:    General: Skin is warm.  Neurological:     Comments: + mild lethargy and slurred speech  + mild RUE weakness    Psychiatric:        Mood and Affect: Mood normal.     Data Reviewed:  There are no new results to review at this time.  CT HEAD WO CONTRAST CLINICAL DATA:  Mental status change, unknown cause.  EXAM: CT HEAD WITHOUT CONTRAST  TECHNIQUE: Contiguous axial images were obtained from the base of the skull through the vertex without intravenous contrast.  RADIATION DOSE REDUCTION: This exam was performed according to the departmental dose-optimization program which includes automated exposure control, adjustment of the mA and/or kV according to patient size and/or use of iterative reconstruction technique.  COMPARISON:  Brain MRI 08/01/2022.  FINDINGS: Brain:  Generalized cerebral and cerebellar atrophy.  Patchy and ill-defined hypoattenuation within the  cerebral white matter, nonspecific but compatible with moderate chronic small vessel ischemic disease.  There is no acute intracranial hemorrhage.  No demarcated cortical infarct.  No extra-axial fluid collection.  No evidence of an intracranial mass.  No midline shift.  Vascular: No hyperdense vessel.  Atherosclerotic calcifications.  Skull: No calvarial fracture or aggressive osseous lesion.  Sinuses/Orbits: No mass or acute finding within the imaged orbits. Mild mucosal thickening within the bilateral sphenoid sinuses.  Other: Incidentally noted small right forehead lipoma. Trace fluid within the bilateral mastoid air cells.  IMPRESSION: 1.  No evidence of an acute intracranial abnormality. 2. Parenchymal atrophy and chronic small vessel ischemic disease. 3. Mild mucosal thickening within the bilateral sphenoid sinuses.  Electronically Signed   By: Jackey Loge D.O.   On: 01/14/2023 14:54  Lab Results  Component Value Date   WBC 8.5 01/14/2023   HGB 11.1 (L) 01/14/2023   HCT 34.9 (L) 01/14/2023   MCV 99.1 01/14/2023   PLT 250 01/14/2023   Last metabolic panel Lab Results  Component Value Date   GLUCOSE 106 (H) 01/14/2023   NA 137 01/14/2023   K 3.8 01/14/2023   CL 104 01/14/2023   CO2 23 01/14/2023   BUN 25 (H) 01/14/2023   CREATININE 1.05 01/14/2023   GFRNONAA >60 01/14/2023   CALCIUM 10.7 (H) 01/14/2023   PHOS 2.2 (L) 10/15/2022   PROT 7.0 01/14/2023   ALBUMIN 4.0 01/14/2023   LABGLOB 3.4 08/23/2022   AGRATIO 1.4 03/24/2017   BILITOT 0.4 01/14/2023   ALKPHOS 76 01/14/2023   AST 17 01/14/2023   ALT 12 01/14/2023   ANIONGAP 10 01/14/2023    Assessment and Plan: * CVA (cerebral vascular accident) (HCC) Noted confusion dysarthria and lethargy over the past 24 hours with noted mild right-sided weakness on exam CT head within normal limits today Will plan for formal CVA evaluation including MRI, CTA of the head neck, 2D echo, risk stratification  labs Add on ammonia level Continue Eliquis and Plavix for now Consult neurology if imaging indicative Monitor  Vertebral osteomyelitis continue doxycycline until 10/24/2022 [facility]) Status post course of antibiotics Now with chronic lower extremity weakness PT OT evaluation as needed Monitor  CKD (chronic kidney disease), stage III (HCC) Cr 1.05 w/ GFR>60  History of CAD (coronary artery disease) No active CP  EKG w/ afib and nonspecific ST/T wave changes  Trop pending  Continue Plavix, toprol, Crestor  Monitor   Heart failure with preserved ejection fraction (HCC) 2D ECHO 09/2022 w/ EF 50-55%  Euvolemic  Monitor    Persistent atrial fibrillation (HCC) Rate controlled at present Continue Eliquis, diltiazem   Dyslipidemia Lipid panel  Cont statin   Essential hypertension, benign Allow for permissive HTN in setting of CVA eval  Prn IV labetalol for SBP>220 or DBP>110     Greater than 50% was spent in counseling and coordination of care with patient Total encounter time 80 minutes or more   Advance Care Planning:   Code Status: Limited: Do not attempt resuscitation (DNR) -DNR-LIMITED -Do Not Intubate/DNI    Consults: None  Family Communication: Wife at the bedside   Severity of Illness: The appropriate patient status for this patient is OBSERVATION. Observation status is judged to be reasonable and necessary in order to provide the required intensity of service to ensure the patient's safety. The patient's presenting symptoms, physical exam findings, and initial radiographic and laboratory data in the context of their medical condition is felt to place them at decreased risk for further clinical deterioration. Furthermore, it is anticipated that the patient will be medically stable for discharge from the hospital within 2 midnights of admission.   Author: Floydene Flock, MD 01/14/2023 4:52 PM  For on call review [email or web address].

## 2023-01-14 NOTE — Assessment & Plan Note (Signed)
Status post course of antibiotics Now with chronic lower extremity weakness PT OT evaluation as needed Monitor

## 2023-01-14 NOTE — Telephone Encounter (Signed)
Patient's wife called back again. Saying her husband was seeing things last night and that she is concerned. I left a message earlier. I was in the middle of going to transfer them to speak with Access Nurse. She told me they were going to call 911. Her number is [phone].

## 2023-01-14 NOTE — Assessment & Plan Note (Addendum)
No active CP  EKG w/ afib and nonspecific ST/T wave changes  Trop pending  Continue Plavix, toprol, Crestor  Monitor

## 2023-01-14 NOTE — ED Triage Notes (Signed)
Pt here via [facility] with AMS from home. Pt was altered yesterday. Pt hypertensive, 190/100. Pt is bed bound.  98 97% 101-cbg

## 2023-01-14 NOTE — Assessment & Plan Note (Signed)
Noted confusion dysarthria and lethargy over the past 24 hours with noted mild right-sided weakness on exam CT head within normal limits today Will plan for formal CVA evaluation including MRI, CTA of the head neck, 2D echo, risk stratification labs Add on ammonia level Continue Eliquis and Plavix for now Consult neurology if imaging indicative Monitor

## 2023-01-14 NOTE — ED Notes (Signed)
Pt hard of hearing but A&Ox3 - could not remember what brought him here.

## 2023-01-14 NOTE — Assessment & Plan Note (Signed)
Cr 1.05 w/ GFR>60

## 2023-01-14 NOTE — Telephone Encounter (Signed)
Patient's wife just called and would like for someone to call her. She said her husband's speech is blur and he is seeing things. She doesn't know what to do. Can someone call her at [phone].

## 2023-01-14 NOTE — Telephone Encounter (Signed)
Noted.  Admitted.  Please call and let her know to let us know if need anything.

## 2023-01-14 NOTE — Telephone Encounter (Signed)
Has arrived at ED.

## 2023-01-14 NOTE — Telephone Encounter (Signed)
EMS is there with patient now. Confirmed with wife.

## 2023-01-14 NOTE — Assessment & Plan Note (Signed)
Allow for permissive HTN in setting of CVA eval  Prn IV labetalol for SBP>220 or DBP>110

## 2023-01-14 NOTE — Progress Notes (Signed)
[facility] Liaison note:   This patient is currently enrolled in [facility] outpatient-based palliative care.  [facility] will continue to follow for discharge disposition.    Please call for any outpatient based palliative care related questions or concerns.   [facility] Liaison [phone]

## 2023-01-14 NOTE — Assessment & Plan Note (Signed)
Lipid panel with elevated LDH, apparently patient was not taking her home statin regularly. Cont statin

## 2023-01-14 NOTE — Telephone Encounter (Signed)
Patient's wife, Callister Kieschnick, called to let Dr. Dale [place] know that patient has been admitted to [facility] in room 114.  Gershon Cull states they are going to do an MRI and she isn't sure what else they are going to do to determine whether or not patient has had a stroke.

## 2023-01-14 NOTE — Telephone Encounter (Signed)
Patient's wife just called back with more information

## 2023-01-15 ENCOUNTER — Inpatient Hospital Stay: Payer: Medicare HMO

## 2023-01-15 DIAGNOSIS — J189 Pneumonia, unspecified organism: Secondary | ICD-10-CM

## 2023-01-15 LAB — LIPID PANEL
Cholesterol: 122 mg/dL (ref 0–200)
HDL: 28 mg/dL — ABNORMAL LOW
LDL Cholesterol: 63 mg/dL (ref 0–99)
Total CHOL/HDL Ratio: 4.4 ratio
Triglycerides: 157 mg/dL — ABNORMAL HIGH
VLDL: 31 mg/dL (ref 0–40)

## 2023-01-15 MED ORDER — METOPROLOL SUCCINATE ER 50 MG PO TB24
100.0000 mg | ORAL_TABLET | Freq: Every day | ORAL | Status: DC
  Administered 2023-01-15 – 2023-01-23 (×9): 100 mg via ORAL
  Filled 2023-01-15 (×9): qty 2

## 2023-01-15 MED ORDER — DEXTROSE 5 % IV SOLN
500.0000 mg | INTRAVENOUS | Status: DC
  Administered 2023-01-15: 500 mg via INTRAVENOUS
  Filled 2023-01-15 (×2): qty 5

## 2023-01-15 MED ORDER — POLYETHYLENE GLYCOL 3350 17 G PO PACK
17.0000 g | PACK | Freq: Every day | ORAL | Status: DC | PRN
  Administered 2023-01-15: 17 g via ORAL
  Filled 2023-01-15: qty 1

## 2023-01-15 MED ORDER — IPRATROPIUM-ALBUTEROL 0.5-2.5 (3) MG/3ML IN SOLN: 3.0000 mL | Freq: Four times a day (QID) | RESPIRATORY_TRACT | Status: DC | PRN

## 2023-01-15 MED ORDER — SODIUM CHLORIDE 0.9 % IV SOLN
1.0000 g | INTRAVENOUS | Status: DC
  Filled 2023-01-15: qty 10

## 2023-01-15 MED ORDER — SODIUM CHLORIDE 0.9 % IV SOLN
2.0000 g | INTRAVENOUS | Status: DC
  Administered 2023-01-15 – 2023-01-21 (×7): 2 g via INTRAVENOUS
  Filled 2023-01-15 (×7): qty 20

## 2023-01-15 NOTE — Telephone Encounter (Signed)
Yes.  They are checking labs in hospital.

## 2023-01-15 NOTE — TOC Initial Note (Addendum)
Transition of Care [facility]) - Initial/Assessment Note    Patient Details  Name: Brett Riley MRN: [id number] Date of Birth: 19-Jul-1935  [facility] CM/SW Contact:    Allena Katz, LCSW Phone Number: 01/15/2023, 1:36 PM  Clinical Narrative:           CSW spoke with wife regarding discharge planning needs. Wife reports that pt was at [facility] from July 24th-October 16th. Pt has a hoyer lift and adjustable bed a walker and a wheelchair at home. CSW spoke with wife about recommendations for hospital bed. Wife is agreeable to this being ordered if insurance pays for it. Pt was active with [facility] prior to admission and would like to resume services with them. Pt also has PCS through [facility] 10-2pm and then 5-9pm. Wife also assists with other care needs. Wife would like [place] to contact [facility] to coordinate when the patient will discharge. Pt is active with Dr. Dale [place] for primary care. Pt also enrolled with [facility] for outpatient palliative care. a CSW to order hospital bed for patient.                     Expected Discharge Plan: Home w Home Health Services Barriers to Discharge: Continued Medical Work up   Patient Goals and CMS Choice Patient states their goals for this hospitalization and ongoing recovery are:: return home CMS Medicare.gov Compare Post Acute Care list provided to:: Patient        Expected Discharge Plan and Services     Post Acute Care Choice: Home Health Living arrangements for the past 2 months: Single Family Home                           HH Arranged: PT, OT HH Agency: [facility] Date [facility] Agency Contacted: 01/15/23   Representative spoke with at [facility] Agency: cheryl  Prior Living Arrangements/Services Living arrangements for the past 2 months: Single Family Home Lives with:: Spouse Patient language and need for interpreter reviewed:: Yes Do you feel safe going back to the place where you  live?: Yes      Need for Family Participation in Patient Care: Yes (Comment) Care giver support system in place?: Yes (comment) Current home services: DME    Activities of Daily Living   ADL Screening (condition at time of admission) Independently performs ADLs?: No Does the patient have a NEW difficulty with bathing/dressing/toileting/self-feeding that is expected to last >3 days?: Yes (Initiates electronic notice to provider for possible OT consult) Does the patient have a NEW difficulty with getting in/out of bed, walking, or climbing stairs that is expected to last >3 days?: Yes (Initiates electronic notice to provider for possible PT consult) Does the patient have a NEW difficulty with communication that is expected to last >3 days?: No Is the patient deaf or have difficulty hearing?: Yes Does the patient have difficulty seeing, even when wearing glasses/contacts?: No Does the patient have difficulty concentrating, remembering, or making decisions?: No  Permission Sought/Granted      Share Information with NAME: wife  Permission granted to share info w AGENCY: [facility], [facility], [facility]        Emotional Assessment       Orientation: : Oriented to Place, Oriented to  Time, Oriented to Situation, Oriented to Self      Admission diagnosis:  Slurred speech [R47.81] Confusion [R41.0] CVA (cerebral vascular accident) (HCC) [  I63.9] Patient Active Problem List   Diagnosis Date Noted   CVA (cerebral vascular accident) (HCC) 01/14/2023   Sleeping difficulty 01/12/2023   Chronic diastolic CHF (congestive heart failure) (HCC) 10/12/2022   History of CAD (coronary artery disease) 10/12/2022   CAP (community acquired pneumonia) 10/12/2022   CKD (chronic kidney disease), stage III (HCC) 10/12/2022   Stage 3 chronic kidney disease (HCC) 08/27/2022   Status post thoracentesis 08/25/2022   Heart failure with preserved ejection fraction (HCC) 08/25/2022   Acute on chronic  HFrEF (heart failure with reduced ejection fraction) (HCC) 08/24/2022   Pleural effusion on right 08/24/2022   Pleural effusion 08/23/2022   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   Sepsis (HCC) 08/21/2022   Paroxysmal atrial fibrillation (HCC) 08/20/2022   Acute respiratory failure with hypoxia (HCC) 08/18/2022   Myocardial injury 08/18/2022   Acute on chronic heart failure with preserved ejection fraction (HFpEF) (HCC) 08/18/2022   Normocytic anemia 08/18/2022   Atrial fibrillation, chronic (HCC) 08/18/2022   MSSA bacteremia 08/05/2022   Vertebral osteomyelitis continue doxycycline until 10/24/2022 (HCC) 08/05/2022   Acute osteomyelitis of lumbar spine (HCC) 08/03/2022   Metabolic acidosis 08/03/2022   Discitis of lumbar region 08/03/2022   Epidural abscess 08/03/2022   MSSA (methicillin susceptible Staphylococcus aureus) septicemia (HCC) 08/02/2022   AKI (acute kidney injury) (HCC) 08/01/2022   Effusion of right knee 08/01/2022   Acute metabolic encephalopathy 08/01/2022   Hyponatremia 07/31/2022   Overweight (BMI 25.0-29.9) 07/31/2022   Persistent atrial fibrillation (HCC) 07/30/2022   Atrial fibrillation with rapid ventricular response (HCC) 07/23/2022   Peripheral vascular disease (HCC) 07/21/2022   Penetrating atherosclerotic ulcer of aorta (HCC) 07/20/2022   Uncontrolled hypertension 07/20/2022   Dyslipidemia 07/20/2022   Coronary artery disease 07/20/2022   Hypoxemia 07/20/2022   Scalp lesion 10/14/2021   Abnormal CT of the abdomen 04/09/2021   Bruising of the right flank 10/16/2020   Aortic atherosclerosis (HCC) 08/27/2020   Leg pain 01/10/2020   Fever 10/03/2019   Hypertension 06/01/2019   Aneurysm artery, 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 [place] [address] Phone: [phone] Fax: [phone]  [id number] [place],  - [address] [place] [address] Phone: [phone] Fax: [phone]  [facility] - [place], [place] - [address] [place] [address] Phone: [phone] Fax: [phone]     Social Determinants of Health (SDOH) Social History: SDOH Screenings   Food Insecurity: No Food Insecurity (01/14/2023)  Housing: Low Risk  (01/14/2023)  Transportation Needs: No Transportation Needs (01/14/2023)  Utilities: Not At Risk (01/14/2023)  Depression (PHQ2-9): Low Risk  (06/06/2022)  Financial Resource Strain: Low Risk  (08/29/2020)  Physical Activity: Insufficiently Active (08/29/2020)  Social Connections: Unknown (08/29/2020)  Stress: No Stress Concern Present (08/29/2020)  Tobacco Use: Low Risk  (01/14/2023)   SDOH Interventions:     Readmission Risk  Interventions    01/15/2023    1:35 PM  Readmission Risk Prevention Plan  Transportation Screening Complete  Medication Review (RN Care Manager) Complete  HRI or Home Care Consult Complete  Palliative Care Screening Complete  Skilled Nursing Facility Complete

## 2023-01-15 NOTE — Progress Notes (Addendum)
SLP Cancellation Note  Patient Details Name: Brett Riley MRN: [id number] DOB: 02-26-1936   Cancelled treatment:       Reason Eval/Treat Not Completed:  (chart reviewed)  Met w/ Wife and pt in room this afternoon. Pt drowsy and sleepy-appearing, often drifting off when not given stimulation.  Wife described pt sleeping more during the day and awake at night; "I'm afraid he's mixing up his days and nights now". She stated medications have been attempted w/ no success; he is fidgity after.  Noted his MRI results: "No acute intracranial abnormality. 2. Age-related cerebral atrophy with moderate to advanced chronic microvascular ischemic disease.".   Unsure if pt has Baseline, underlying Cognitive decline/changes that may be exacerbated by recent illnesses and hospitalizations and changes of settings. Will defer a cognitive-linguistic assessment at this time and recommend f/u w/ primary MD/possibly Neurology for assessment and to address Wife's questions re: pt's status and prognosis.  Recommend attempting to maintain a daily/nightly schedule including meal schedule and reduce distractions in room around bedtime. Wife agreed.  ST services can be available for further questions. Recommend f/u for any cognitive-linguistic assessment and/or tx at next venue of care when pt is in a more structured setting and post Acuity of illness(osteomyelitis of knee). Wife agreed.       Jerilynn Som, MS, CCC-SLP Speech Language Pathologist Rehab Services; [facility] [phone] (ascom) Jerilynn Som, MS, CCC-SLP Speech Language Pathologist Rehab Services; [facility] [phone] (ascom) Brett Riley 01/15/2023, 5:37 PM

## 2023-01-15 NOTE — Telephone Encounter (Signed)
Wife is aware

## 2023-01-15 NOTE — Evaluation (Signed)
Occupational Therapy Evaluation Patient Details Name: Brett Riley MRN: [id number] DOB: 11-29-1935 Today's Date: 01/15/2023   History of Present Illness Pt is an 87 y.o. male with medical history significant of diastolic CHF, CAD, PAF on Eliquis, vertebral osteomyelitis, and HTN presenting with suspected CVA with dysarthria and AMS.  Imaging completed with CVA ruled out.   Clinical Impression   Brett Riley was seen for OT evaluation this date. Prior to hospital admission, pt was recently d/c home from [facility] and primarily bed bound at home since RLE NWBing pending CT. Pt lives with spouse and PCAs. Pt currently requires MAX A x2 rolling bed level and exiting bed. +2 assist to maintain sitting balance. MAX A don B socks at bed level. Unable to trial seated grooming tasks 2/2 poor sitting balance/tolerance. Educated spouse on DME recs including hospital bed. Pt would benefit from skilled OT to address noted impairments and functional limitations (see below for any additional details). Upon hospital discharge, recommend OT follow up and hospital bed.    If plan is discharge home, recommend the following: Two people to help with bathing/dressing/bathroom    Functional Status Assessment  Patient has had a recent decline in their functional status and demonstrates the ability to make significant improvements in function in a reasonable and predictable amount of time.  Equipment Recommendations  Hospital bed    Recommendations for Other Services       Precautions / Restrictions Precautions Precautions: Fall Restrictions Weight Bearing Restrictions: Yes RLE Weight Bearing: Non weight bearing Other Position/Activity Restrictions: RLE NWB per text chat with Dr. Signa Kell pending results of R knee CT      Mobility Bed Mobility Overal bed mobility: Needs Assistance Bed Mobility: Rolling, Sidelying to Sit, Sit to Supine Rolling: Max assist Sidelying to sit: Max assist, +2 for physical  assistance   Sit to supine: Max assist, +2 for physical assistance        Transfers                   General transfer comment: Unable/unsafe to attempt      Balance Overall balance assessment: Needs assistance Sitting-balance support: Bilateral upper extremity supported, Feet supported Sitting balance-Leahy Scale: Poor   Postural control: Posterior lean                                 ADL either performed or assessed with clinical judgement   ADL Overall ADL's : Needs assistance/impaired                                       General ADL Comments: MAX A x2 simulated bed level toileting. MAX A don B socks at bed level. Unable to trial seated grooming tasks 2/2 poor sitting balance/tolerance      Pertinent Vitals/Pain Pain Assessment Pain Assessment: Faces Faces Pain Scale: Hurts a little bit Pain Location: R knee with ROM Pain Descriptors / Indicators: Discomfort Pain Intervention(s): Limited activity within patient's tolerance, Repositioned     Extremity/Trunk Assessment Upper Extremity Assessment Upper Extremity Assessment: Generalized weakness   Lower Extremity Assessment Lower Extremity Assessment: Generalized weakness       Communication Communication Communication: Hearing impairment Cueing Techniques: Verbal cues;Tactile cues   Cognition Arousal: Lethargic Behavior During Therapy: WFL for tasks assessed/performed Overall Cognitive Status: Difficult to assess  General Comments: spouse reports pt is awake at night and asleep during the day                Home Living Family/patient expects to be discharged to:: Private residence Living Arrangements: Spouse/significant other Available Help at Discharge: Family;Available 24 hours/day;Personal care attendant Type of Home: House Home Access: Stairs to enter [employer] of Steps: 2 Entrance Stairs-Rails:  Right Home Layout: Two level;Able to live on main level with bedroom/bathroom     Bathroom Shower/Tub: Chief Strategy Officer: Handicapped height Bathroom Accessibility: Yes   Home Equipment: Wheelchair - manual;Lift chair   Additional Comments: 2 PCAs from 10-2 and 5-9; adjustable bed      Prior Functioning/Environment Prior Level of Function : Needs assist             Mobility Comments: mostly bed bound since returning from [facility] in Oct 2024, PCAs assist with sitting EOB and transfers (limited attempts as pt RLE NWB pending R knee CT) ADLs Comments: PCAs assists with ADLs        OT Problem List: Decreased strength;Decreased range of motion;Decreased activity tolerance;Impaired balance (sitting and/or standing);Decreased safety awareness      OT Treatment/Interventions: Self-care/ADL training;Therapeutic exercise;Energy conservation;DME and/or AE instruction;Therapeutic activities;Patient/family education;Balance training    OT Goals(Current goals can be found in the care plan section) Acute Rehab OT Goals Patient Stated Goal: to go home OT Goal Formulation: With patient/family Time For Goal Achievement: 01/29/23 Potential to Achieve Goals: Fair ADL Goals Pt Will Perform Grooming: sitting;with min assist Pt Will Perform Upper Body Bathing: with set-up;bed level Pt Will Transfer to Toilet: with mod assist (rolling bed level)  OT Frequency: Min 1X/week    Co-evaluation PT/OT/SLP Co-Evaluation/Treatment: Yes Reason for Co-Treatment: For patient/therapist safety PT goals addressed during session: Mobility/safety with mobility;Balance;Strengthening/ROM OT goals addressed during session: ADL's and self-care      AM-PAC OT "6 Clicks" Daily Activity     Outcome Measure Help from another person eating meals?: A Little Help from another person taking care of personal grooming?: A Little Help from another person toileting, which includes using toliet, bedpan, or  urinal?: A Lot Help from another person bathing (including washing, rinsing, drying)?: A Lot Help from another person to put on and taking off regular upper body clothing?: A Lot Help from another person to put on and taking off regular lower body clothing?: A Lot 6 Click Score: 14   End of Session    Activity Tolerance: Patient limited by lethargy Patient left: in bed;with call bell/phone within reach;with nursing/sitter in room;with family/visitor present  OT Visit Diagnosis: Unsteadiness on feet (R26.81);Muscle weakness (generalized) (M62.81)                Time: 3244-0102 OT Time Calculation (min): 26 min Charges:  OT General Charges $OT Visit: 1 Visit OT Evaluation $OT Eval Moderate Complexity: 1 Mod  Kathie Dike, M.S. OTR/L  01/15/23, 12:57 PM  ascom [phone]

## 2023-01-15 NOTE — Plan of Care (Signed)

## 2023-01-15 NOTE — Progress Notes (Signed)
PROGRESS NOTE    Brett Riley  [name] DOB: 06-30-1935 DOA: 01/14/2023 PCP: Dale [place], MD   Assessment & Plan:   Principal Problem:   CVA (cerebral vascular accident) [facility]) Active Problems:   Vertebral osteomyelitis continue doxycycline until 10/24/2022 [facility])   Essential hypertension, benign   Dyslipidemia   Persistent atrial fibrillation (HCC)   Heart failure with preserved ejection fraction (HCC)   History of CAD (coronary artery disease)   CKD (chronic kidney disease), stage III (HCC)  Assessment and Plan: B/l pneumonia: as per CXR. Started on IV rocephin, azithromycin, bronchodilators & encourage incentive spirometry.   CVA: r/o as per MRI.   Possible fibromuscular dysplasia: as per CTA head & neck. Message sent to vasc surg to discuss findings, awaiting response   Vertebral osteomyelitis:  completed abx course. Now with chronic lower extremity weakness. PT/OT consulted  Chronic right knee pain: was scheduled outpatient to CT right knee WO to r/o fracture as per ortho surg (Dr. Eliane Decree) which was ordered inpatient    CKDIIIa: Cr is labile. Avoid nephrotoxic meds   Hx of CAD: no CP. Continue on metoprolol, statin, plavix    Chronic diastolic CHF: appears euvolemic. Continue on home dose of metoprolol, statin. Monitor I/Os    Persistent a. Fib: continue on home dose of diltiazem, metoprolol, eliquis    HLD: continue on statin  HTN: continue on home dose of metoprolol, diltiazem      DVT prophylaxis: eliquis  Code Status: DNR Family Communication: discussed pt's care w/ pt's family at bedside and answered their questions  Disposition Plan: depends on PT/OT recs   Level of care: Telemetry Medical Status is: Inpatient Remains inpatient appropriate because: severity of illness    Consultants:    Procedures:   Antimicrobials: rocephin, azithromycin   Subjective: Pt c/o fatigue   Objective: Vitals:   01/14/23 2004 01/15/23 0000 01/15/23  0351 01/15/23 0736  BP: 131/85 (!) 129/94 (!) 158/89 (!) 164/95  Pulse: (!) 104 97 96 (!) 102  Resp: 18 18 18 18   Temp: 98.3 F (36.8 C) 98.9 F (37.2 C) 98.3 F (36.8 C) 98.2 F (36.8 C)  TempSrc:  Oral    SpO2: 98% 97% 98% 100%  Weight:      Height:        Intake/Output Summary (Last 24 hours) at 01/15/2023 0803 Last data filed at 01/15/2023 0507 Gross per 24 hour  Intake 362.09 ml  Output 600 ml  Net -237.91 ml   Filed Weights   01/14/23 1112  Weight: 83.9 kg    Examination:  General exam: Appears lethargic  Respiratory system: decreased breath sounds b/l  Cardiovascular system: S1 & S2 +. No rubs, gallops or clicks.  Gastrointestinal system: Abdomen is nondistended, soft and nontender.  Normal bowel sounds heard. Central nervous system: lethargic.  Psychiatry: Judgement and insight appears not at baseline. Flat mood and affect    Data Reviewed: I have personally reviewed following labs and imaging studies  CBC: Recent Labs  Lab 01/14/23 1114  WBC 8.5  HGB 11.1*  HCT 34.9*  MCV 99.1  PLT 250   Basic Metabolic Panel: Recent Labs  Lab 01/14/23 1114  NA 137  K 3.8  CL 104  CO2 23  GLUCOSE 106*  BUN 25*  CREATININE 1.05  CALCIUM 10.7*   GFR: Estimated Creatinine Clearance: 56.5 mL/min (by C-G formula based on SCr of 1.05 mg/dL). Liver Function Tests: Recent Labs  Lab 01/14/23 1114  AST 17  ALT  12  ALKPHOS 76  BILITOT 0.4  PROT 7.0  ALBUMIN 4.0   No results for input(s): "LIPASE", "AMYLASE" in the last 168 hours. No results for input(s): "AMMONIA" in the last 168 hours. Coagulation Profile: Recent Labs  Lab 01/14/23 1114  INR 1.3*   Cardiac Enzymes: No results for input(s): "CKTOTAL", "CKMB", "CKMBINDEX", "TROPONINI" in the last 168 hours. BNP (last 3 results) No results for input(s): "PROBNP" in the last 8760 hours. HbA1C: No results for input(s): "HGBA1C" in the last 72 hours. CBG: No results for input(s): "GLUCAP" in the last  168 hours. Lipid Profile: Recent Labs    01/15/23 0417  CHOL 122  HDL 28*  LDLCALC 63  TRIG 324*  CHOLHDL 4.4   Thyroid Function Tests: No results for input(s): "TSH", "T4TOTAL", "FREET4", "T3FREE", "THYROIDAB" in the last 72 hours. Anemia Panel: No results for input(s): "VITAMINB12", "FOLATE", "FERRITIN", "TIBC", "IRON", "RETICCTPCT" in the last 72 hours. Sepsis Labs: No results for input(s): "PROCALCITON", "LATICACIDVEN" in the last 168 hours.  No results found for this or any previous visit (from the past 240 hour(s)).       Radiology Studies: MR BRAIN WO CONTRAST  Result Date: 01/14/2023 CLINICAL DATA:  Initial evaluation for neuro deficit, stroke suspected. EXAM: MRI HEAD WITHOUT CONTRAST TECHNIQUE: Multiplanar, multiecho pulse sequences of the brain and surrounding structures were obtained without intravenous contrast. COMPARISON:  CT from earlier the same day. FINDINGS: Brain: Generalized age-related cerebral atrophy. Patchy and confluent T2/FLAIR hyperintensity involving the periventricular and deep white matter both cerebral hemispheres, consistent with chronic small vessel ischemic disease, moderate to advanced in nature. Superimposed remote lacunar infarct noted at the right lentiform nucleus. No evidence for acute or subacute ischemia. Gray-white matter differentiation maintained. No acute or chronic intracranial blood products. No mass lesion, midline shift or mass effect. No hydrocephalus or extra-axial fluid collection. Partially empty sella noted. Vascular: Major intracranial vascular flow voids are maintained. Skull and upper cervical spine: Craniocervical junction within normal limits. Bone marrow signal intensity normal. Small lipoma noted at the right forehead. Sinuses/Orbits: Globes orbital soft tissues within normal limits. Small volume layering secretions noted within the right sphenoid sinus. Paranasal sinuses are otherwise largely clear. Trace bilateral mastoid  effusions, of doubtful significance. Other: None. IMPRESSION: 1. No acute intracranial abnormality. 2. Age-related cerebral atrophy with moderate to advanced chronic microvascular ischemic disease. Electronically Signed   By: Rise Mu M.D.   On: 01/14/2023 21:55   CT ANGIO HEAD NECK W WO CM  Result Date: 01/14/2023 CLINICAL DATA:  Altered mental status, stroke suspected EXAM: CT ANGIOGRAPHY HEAD AND NECK WITH AND WITHOUT CONTRAST TECHNIQUE: Multidetector CT imaging of the head and neck was performed using the standard protocol during bolus administration of intravenous contrast. Multiplanar CT image reconstructions and MIPs were obtained to evaluate the vascular anatomy. Carotid stenosis measurements (when applicable) are obtained utilizing NASCET criteria, using the distal internal carotid diameter as the denominator. RADIATION DOSE REDUCTION: This exam was performed according to the departmental dose-optimization program which includes automated exposure control, adjustment of the mA and/or kV according to patient size and/or use of iterative reconstruction technique. CONTRAST:  75mL OMNIPAQUE IOHEXOL 350 MG/ML SOLN COMPARISON:  No prior CTA head available, correlation is made with 01/04/2016 CTA neck and 01/14/2023 CT head FINDINGS: CT HEAD FINDINGS For noncontrast findings, please see same day CT head. CTA NECK FINDINGS Aortic arch: The arch vessel origins are incompletely included in the field of view. In correlation with the prior CTA, there  appears to be a two-vessel arch with a common origin of the brachiocephalic and left common carotid arteries. Imaged portion shows no evidence of aneurysm or dissection. Aortic atherosclerosis. 70% stenosis at the origin of the right subclavian artery (series 6, image 219). Right carotid system: No evidence of dissection, occlusion, or hemodynamically significant stenosis (greater than 50%). Atherosclerotic disease in the common carotid, at the  bifurcation, and in the proximal ICA is not hemodynamically significant. Left carotid system: No evidence of dissection, occlusion, or hemodynamically significant stenosis (greater than 50%). S Atherosclerotic disease in the common carotid, at the bifurcation, and in the proximal ICA is not hemodynamically significant. Possible mild beading mid to distal left ICA (series 8, image 124). Vertebral arteries: Severe, near occlusive stenosis at the origin of the bilateral vertebral arteries, which has progressed from the prior exam. Additional moderate stenosis in the right V2 segment. No evidence of dissection. Skeleton: No acute osseous abnormality. Degenerative changes in the cervical spine. Other neck: No acute finding. Upper chest: No focal pulmonary opacity or pleural effusion. Review of the MIP images confirms the above findings CTA HEAD FINDINGS Anterior circulation: Both internal carotid arteries are patent to the termini, with mild stenosis in the left supraclinoid ICA and severe stenosis in the right supraclinoid ICA. A1 segments patent. Normal anterior communicating artery. Anterior cerebral arteries are patent to their distal aspects without significant stenosis. No M1 stenosis or occlusion. MCA branches perfused to their distal aspects without significant stenosis. Posterior circulation: Vertebral arteries patent to the vertebrobasilar junction without significant stenosis. Posterior inferior cerebellar arteries patent proximally. Basilar patent to its distal aspect without significant stenosis. Superior cerebellar arteries patent proximally. Patent P1 segments. PCAs perfused to their distal aspects without significant stenosis. The bilateral posterior communicating arteries are not visualized. Venous sinuses: As permitted by contrast timing, patent. Anatomic variants: None significant. No evidence of aneurysm or vascular malformation. Review of the MIP images confirms the above findings IMPRESSION: 1. No  intracranial large vessel occlusion. Severe stenosis in the right supraclinoid ICA and mild stenosis in the left supraclinoid ICA. 2. Severe, near occlusive stenosis at the origin of the bilateral vertebral arteries, which has progressed from the prior exam. Additional moderate stenosis in the right V2 segment. 3. 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 4. Possible mild beading in the mid to distal left ICA, which can be seen in the setting of fibromuscular dysplasia. 5. Aortic atherosclerosis. Aortic Atherosclerosis (ICD10-I70.0). Electronically Signed   By: Wiliam Ke M.D.   On: 01/14/2023 19:07   DG Chest Portable 1 View  Result Date: 01/14/2023 CLINICAL DATA:  Altered mental status. EXAM: PORTABLE CHEST 1 VIEW COMPARISON:  Chest x-ray 09/23/2022. FINDINGS: Mild streaky bibasilar opacities. No visible pleural effusions or pneumothorax. Similar cardiomediastinal silhouette. No acute bony abnormality. IMPRESSION: Mild streaky bibasilar opacities, which could represent atelectasis, aspiration, and/or pneumonia. Electronically Signed   By: Feliberto Harts M.D.   On: 01/14/2023 17:35   CT HEAD WO CONTRAST  Result Date: 01/14/2023 CLINICAL DATA:  Mental status change, unknown cause. EXAM: CT HEAD WITHOUT CONTRAST TECHNIQUE: Contiguous axial images were obtained from the base of the skull through the vertex without intravenous contrast. RADIATION DOSE REDUCTION: This exam was performed according to the departmental dose-optimization program which includes automated exposure control, adjustment of the mA and/or kV according to patient size and/or use of iterative reconstruction technique. COMPARISON:  Brain MRI 08/01/2022. FINDINGS: Brain: Generalized cerebral and cerebellar atrophy. Patchy and ill-defined hypoattenuation within  the cerebral white matter, nonspecific but compatible with moderate chronic small vessel ischemic disease. There is no acute intracranial hemorrhage. No  demarcated cortical infarct. No extra-axial fluid collection. No evidence of an intracranial mass. No midline shift. Vascular: No hyperdense vessel.  Atherosclerotic calcifications. Skull: No calvarial fracture or aggressive osseous lesion. Sinuses/Orbits: No mass or acute finding within the imaged orbits. Mild mucosal thickening within the bilateral sphenoid sinuses. Other: Incidentally noted small right forehead lipoma. Trace fluid within the bilateral mastoid air cells. IMPRESSION: 1.  No evidence of an acute intracranial abnormality. 2. Parenchymal atrophy and chronic small vessel ischemic disease. 3. Mild mucosal thickening within the bilateral sphenoid sinuses. Electronically Signed   By: Jackey Loge D.O.   On: 01/14/2023 14:54        Scheduled Meds:   stroke: early stages of recovery book   Does not apply Once   apixaban  2.5 mg Oral BID   clopidogrel  75 mg Oral Daily   diltiazem  360 mg Oral Daily   pantoprazole  40 mg Oral q morning   Continuous Infusions:  sodium chloride Stopped (01/15/23 0507)     LOS: 1 day       Charise Killian, MD [facility] Pager [phone]  If 7PM-7AM, please contact night-coverage [email or web address] 01/15/2023, 8:03 AM

## 2023-01-15 NOTE — Telephone Encounter (Signed)
Do you want to hold off on met b since he is currently admitted. We were checking since we started him on remeron.

## 2023-01-15 NOTE — Evaluation (Signed)
Physical Therapy Evaluation Patient Details Name: Brett Riley MRN: [id number] DOB: 1935/07/11 Today's Date: 01/15/2023  History of Present Illness  Pt is an 87 y.o. male with medical history significant of diastolic CHF, CAD, PAF on Eliquis, vertebral osteomyelitis, and HTN presenting with suspected CVA with dysarthria and AMS.  Imaging completed with CVA ruled out.   Clinical Impression  Pt lethargic during the session but pleasant and put forth fair effort throughout.  Pt required extensive physical assistance with all bed mobility tasks and once in unsupported sitting at the EOB presented with poor static sitting balance.  Pt participated in anterior weight shifting activities at the EOB which resulted in momentary improvement in static sitting balance but pt would quickly revert back to pronounced posterior lean requiring mod A to correct.  Per spouse pt had appt with Dr. Signa Kell regarding pain and swelling to his R knee and they are currently still awaiting an appt to be made for a CT scan to rule out fracture of the R knee.  Reached out to Dr. Allena Katz via text chat who confirmed NWB status at this time to the R knee pending outcome of CT.  Pt will benefit from continued PT services upon discharge to safely address deficits listed in patient problem list for decreased caregiver assistance and eventual return to PLOF.         If plan is discharge home, recommend the following: Two people to help with walking and/or transfers;A lot of help with bathing/dressing/bathroom;Assistance with cooking/housework;Assistance with feeding;Direct supervision/assist for medications management;Direct supervision/assist for financial management;Assist for transportation;Help with stairs or ramp for entrance   Can travel by private vehicle        Equipment Recommendations Hospital bed  Recommendations for Other Services       Functional Status Assessment Patient has had a recent decline in their  functional status and/or demonstrates limited ability to make significant improvements in function in a reasonable and predictable amount of time     Precautions / Restrictions Precautions Precautions: Fall Restrictions Weight Bearing Restrictions: Yes RLE Weight Bearing: Non weight bearing Other Position/Activity Restrictions: RLE NWB per text chat with Dr. Signa Kell pending results of R knee CT      Mobility  Bed Mobility Overal bed mobility: Needs Assistance Bed Mobility: Rolling, Sidelying to Sit, Sit to Supine Rolling: Max assist Sidelying to sit: +2 for physical assistance, Max assist   Sit to supine: +2 for physical assistance, Max assist   General bed mobility comments: +2 Max A for BLE and trunk control and max cuing for sequencing, very poor static sitting balance at EOB    Transfers                   General transfer comment: Unable/unsafe to attempt    Ambulation/Gait                  Stairs            Wheelchair Mobility     Tilt Bed    Modified Rankin (Stroke Patients Only)       Balance Overall balance assessment: Needs assistance Sitting-balance support: Bilateral upper extremity supported, Feet supported Sitting balance-Leahy Scale: Poor Sitting balance - Comments: Pt required near constant min to mod A to prevent posterior LOB in sitting Postural control: Posterior lean     Standing balance comment: unable/unsafe to attempt standing  Pertinent Vitals/Pain Pain Assessment Pain Assessment: No/denies pain    Home Living Family/patient expects to be discharged to:: Private residence Living Arrangements: Spouse/significant other Available Help at Discharge: Family;Available 24 hours/day;Personal care attendant Type of Home: House Home Access: Stairs to enter Entrance Stairs-Rails: Right Entrance Stairs-Number of Steps: 2   Home Layout: Two level;Able to live on main level with  bedroom/bathroom Home Equipment: Wheelchair - manual;Lift chair Additional Comments: PCA from 10-2 and 5-9    Prior Function               Mobility Comments: For the last two months pt has been mostly bed bound with PCA assisting pt with bed exercise and sitting at EOB.  Several months ago pt was able to assist with standing to a RW and pivoting to/from wheelchair but has been advised to be NWB to the RLE since Sept pending results of CT imaging of the R knee that the pt is still waiting to have ADLs Comments: PCA assists with ADLs     Extremity/Trunk Assessment   Upper Extremity Assessment Upper Extremity Assessment: Generalized weakness    Lower Extremity Assessment Lower Extremity Assessment: Generalized weakness       Communication   Communication Cueing Techniques: Verbal cues;Tactile cues  Cognition Arousal: Lethargic Behavior During Therapy: Flat affect Overall Cognitive Status: Difficult to assess                                          General Comments      Exercises Other Exercises Other Exercises: Anterior weight shifting in sitting at EOB to address posterior lean   Assessment/Plan    PT Assessment Patient needs continued PT services  PT Problem List Decreased strength;Decreased activity tolerance;Decreased balance;Decreased mobility;Decreased knowledge of use of DME;Decreased knowledge of precautions       PT Treatment Interventions DME instruction;Gait training;Functional mobility training;Therapeutic activities;Therapeutic exercise;Balance training;Patient/family education    PT Goals (Current goals can be found in the Care Plan section)  Acute Rehab PT Goals Patient Stated Goal: Improved strength and functional mobility PT Goal Formulation: With patient/family Time For Goal Achievement: 01/28/23 Potential to Achieve Goals: Fair    Frequency Min 1X/week     Co-evaluation PT/OT/SLP Co-Evaluation/Treatment: Yes Reason for  Co-Treatment: For patient/therapist safety PT goals addressed during session: Mobility/safety with mobility;Balance;Strengthening/ROM         AM-PAC PT "6 Clicks" Mobility  Outcome Measure Help needed turning from your back to your side while in a flat bed without using bedrails?: Total Help needed moving from lying on your back to sitting on the side of a flat bed without using bedrails?: Total Help needed moving to and from a bed to a chair (including a wheelchair)?: Total Help needed standing up from a chair using your arms (e.g., wheelchair or bedside chair)?: Total Help needed to walk in hospital room?: Total Help needed climbing 3-5 steps with a railing? : Total 6 Click Score: 6    End of Session   Activity Tolerance: Patient limited by lethargy Patient left: in bed;with call bell/phone within reach;with bed alarm set;with family/visitor present Nurse Communication: Mobility status PT Visit Diagnosis: Muscle weakness (generalized) (M62.81);Difficulty in walking, not elsewhere classified (R26.2)    Time: 8119-1478 PT Time Calculation (min) (ACUTE ONLY): 34 min   Charges:   PT Evaluation $PT Eval Moderate Complexity: 1 Mod   PT General Charges $$  ACUTE PT VISIT: 1 Visit       D. Elly Modena PT, DPT 01/15/23, 11:48 AM

## 2023-01-15 NOTE — Plan of Care (Signed)
Problem: Education: Goal: Knowledge of General Education information will improve Description: Including pain rating scale, medication(s)/side effects and non-pharmacologic comfort measures 01/15/2023 0557 by Garrison Columbus, RN Outcome: Progressing 01/15/2023 0557 by Garrison Columbus, RN Outcome: Progressing   Problem: Health Behavior/Discharge Planning: Goal: Ability to manage health-related needs will improve 01/15/2023 0557 by Garrison Columbus, RN Outcome: Progressing 01/15/2023 0557 by Garrison Columbus, RN Outcome: Progressing   Problem: Clinical Measurements: Goal: Ability to maintain clinical measurements within normal limits will improve 01/15/2023 0557 by Garrison Columbus, RN Outcome: Progressing 01/15/2023 0557 by Garrison Columbus, RN Outcome: Progressing Goal: Will remain free from infection 01/15/2023 0557 by Garrison Columbus, RN Outcome: Progressing 01/15/2023 0557 by Garrison Columbus, RN Outcome: Progressing Goal: Diagnostic test results will improve 01/15/2023 0557 by Garrison Columbus, RN Outcome: Progressing 01/15/2023 0557 by Garrison Columbus, RN Outcome: Progressing Goal: Respiratory complications will improve 01/15/2023 0557 by Garrison Columbus, RN Outcome: Progressing 01/15/2023 0557 by Garrison Columbus, RN Outcome: Progressing Goal: Cardiovascular complication will be avoided 01/15/2023 0557 by Garrison Columbus, RN Outcome: Progressing 01/15/2023 0557 by Garrison Columbus, RN Outcome: Progressing   Problem: Activity: Goal: Risk for activity intolerance will decrease 01/15/2023 0557 by Garrison Columbus, RN Outcome: Progressing 01/15/2023 0557 by Garrison Columbus, RN Outcome: Progressing   Problem: Nutrition: Goal: Adequate nutrition will be maintained 01/15/2023 0557 by Garrison Columbus, RN Outcome: Progressing 01/15/2023 0557 by Garrison Columbus, RN Outcome: Progressing   Problem: Coping: Goal: Level of anxiety will decrease 01/15/2023  0557 by Garrison Columbus, RN Outcome: Progressing 01/15/2023 0557 by Garrison Columbus, RN Outcome: Progressing   Problem: Elimination: Goal: Will not experience complications related to bowel motility 01/15/2023 0557 by Garrison Columbus, RN Outcome: Progressing 01/15/2023 0557 by Garrison Columbus, RN Outcome: Progressing Goal: Will not experience complications related to urinary retention 01/15/2023 0557 by Garrison Columbus, RN Outcome: Progressing 01/15/2023 0557 by Garrison Columbus, RN Outcome: Progressing   Problem: Pain Management: Goal: General experience of comfort will improve 01/15/2023 0557 by Garrison Columbus, RN Outcome: Progressing 01/15/2023 0557 by Garrison Columbus, RN Outcome: Progressing   Problem: Safety: Goal: Ability to remain free from injury will improve 01/15/2023 0557 by Garrison Columbus, RN Outcome: Progressing 01/15/2023 0557 by Garrison Columbus, RN Outcome: Progressing   Problem: Skin Integrity: Goal: Risk for impaired skin integrity will decrease 01/15/2023 0557 by Garrison Columbus, RN Outcome: Progressing 01/15/2023 0557 by Garrison Columbus, RN Outcome: Progressing   Problem: Education: Goal: Knowledge of disease or condition will improve 01/15/2023 0557 by Garrison Columbus, RN Outcome: Progressing 01/15/2023 0557 by Garrison Columbus, RN Outcome: Progressing Goal: Knowledge of secondary prevention will improve (MUST DOCUMENT ALL) 01/15/2023 0557 by Garrison Columbus, RN Outcome: Progressing 01/15/2023 0557 by Garrison Columbus, RN Outcome: Progressing Goal: Knowledge of patient specific risk factors will improve [name] Leriche N/A or DELETE if not current risk factor) 01/15/2023 0557 by Garrison Columbus, RN Outcome: Progressing 01/15/2023 0557 by Garrison Columbus, RN Outcome: Progressing   Problem: Ischemic Stroke/TIA Tissue Perfusion: Goal: Complications of ischemic stroke/TIA will be minimized 01/15/2023 0557 by Garrison Columbus,  RN Outcome: Progressing 01/15/2023 0557 by Garrison Columbus, RN Outcome: Progressing   Problem: Coping: Goal: Will verbalize positive feelings about self 01/15/2023 0557 by Garrison Columbus, RN Outcome: Progressing 01/15/2023 0557 by Garrison Columbus, RN Outcome: Progressing Goal: Will identify appropriate support needs 01/15/2023 0557 by Garrison Columbus, RN Outcome: Progressing 01/15/2023 0557 by Garrison Columbus, RN Outcome: Progressing   Problem: Health Behavior/Discharge Planning: Goal: Ability to manage health-related needs will improve 01/15/2023 0557 by Garrison Columbus, RN Outcome: Progressing 01/15/2023 0557 by  Garrison Columbus, RN Outcome: Progressing Goal: Goals will be collaboratively established with patient/family 01/15/2023 0557 by Garrison Columbus, RN Outcome: Progressing 01/15/2023 0557 by Garrison Columbus, RN Outcome: Progressing   Problem: Self-Care: Goal: Ability to participate in self-care as condition permits will improve 01/15/2023 0557 by Garrison Columbus, RN Outcome: Progressing 01/15/2023 0557 by Garrison Columbus, RN Outcome: Progressing Goal: Verbalization of feelings and concerns over difficulty with self-care will improve 01/15/2023 0557 by Garrison Columbus, RN Outcome: Progressing 01/15/2023 0557 by Garrison Columbus, RN Outcome: Progressing Goal: Ability to communicate needs accurately will improve 01/15/2023 0557 by Garrison Columbus, RN Outcome: Progressing 01/15/2023 0557 by Garrison Columbus, RN Outcome: Progressing   Problem: Nutrition: Goal: Risk of aspiration will decrease 01/15/2023 0557 by Garrison Columbus, RN Outcome: Progressing 01/15/2023 0557 by Garrison Columbus, RN Outcome: Progressing Goal: Dietary intake will improve 01/15/2023 0557 by Garrison Columbus, RN Outcome: Progressing 01/15/2023 0557 by Garrison Columbus, RN Outcome: Progressing

## 2023-01-16 ENCOUNTER — Inpatient Hospital Stay: Payer: Medicare HMO

## 2023-01-16 ENCOUNTER — Inpatient Hospital Stay (HOSPITAL_COMMUNITY)
Admit: 2023-01-16 | Discharge: 2023-01-16 | Disposition: A | Discharge status: Home / Self Care | Payer: Medicare HMO | Attending: Family Medicine | Admitting: Family Medicine

## 2023-01-16 DIAGNOSIS — J189 Pneumonia, unspecified organism: Secondary | ICD-10-CM | POA: Diagnosis not present

## 2023-01-16 DIAGNOSIS — I10 Essential (primary) hypertension: Secondary | ICD-10-CM

## 2023-01-16 LAB — ECHOCARDIOGRAM COMPLETE
AR max vel: 2.49 cm2
AV Area VTI: 3.26 cm2
AV Area mean vel: 2.58 cm2
AV Mean grad: 3 mmHg
AV Peak grad: 6 mmHg
Ao pk vel: 1.22 m/s
Area-P 1/2: 4.8 cm2
Calc EF: 46.2 %
Height: 72 in
MV VTI: 6.28 cm2
S' Lateral: 2.9 cm
Single Plane A2C EF: 40.1 %
Single Plane A4C EF: 46.4 %
Weight: 2959.46 [oz_av]

## 2023-01-16 LAB — BASIC METABOLIC PANEL WITH GFR
Anion gap: 8 (ref 5–15)
BUN: 24 mg/dL — ABNORMAL HIGH (ref 8–23)
CO2: 25 mmol/L (ref 22–32)
Calcium: 10.1 mg/dL (ref 8.9–10.3)
Chloride: 104 mmol/L (ref 98–111)
Creatinine, Ser: 0.97 mg/dL (ref 0.61–1.24)
GFR, Estimated: >60 mL/min
Glucose, Bld: 96 mg/dL (ref 70–99)
Potassium: 3.7 mmol/L (ref 3.5–5.1)
Sodium: 137 mmol/L (ref 135–145)

## 2023-01-16 LAB — CBC
HCT: 27.6 % — ABNORMAL LOW (ref 39.0–52.0)
Hemoglobin: 9.2 g/dL — ABNORMAL LOW (ref 13.0–17.0)
MCH: 31.5 pg (ref 26.0–34.0)
MCHC: 33.3 g/dL (ref 30.0–36.0)
MCV: 94.5 fL (ref 80.0–100.0)
Platelets: 215 10*3/uL (ref 150–400)
RBC: 2.92 MIL/uL — ABNORMAL LOW (ref 4.22–5.81)
RDW: 15.2 % (ref 11.5–15.5)
WBC: 7.2 10*3/uL (ref 4.0–10.5)
nRBC: 0 % (ref 0.0–0.2)

## 2023-01-16 MED ORDER — AZITHROMYCIN 500 MG PO TABS
500.0000 mg | ORAL_TABLET | Freq: Every day | ORAL | Status: AC
  Administered 2023-01-16 – 2023-01-17 (×2): 500 mg via ORAL
  Filled 2023-01-16 (×2): qty 1

## 2023-01-16 MED ORDER — ROSUVASTATIN CALCIUM 10 MG PO TABS
10.0000 mg | ORAL_TABLET | Freq: Every day | ORAL | Status: DC
  Administered 2023-01-16 – 2023-01-23 (×8): 10 mg via ORAL
  Filled 2023-01-16 (×8): qty 1

## 2023-01-16 MED ORDER — GADOBUTROL 1 MMOL/ML IV SOLN
8.0000 mL | Freq: Once | INTRAVENOUS | Status: AC | PRN
  Administered 2023-01-16: 8 mL via INTRAVENOUS

## 2023-01-16 NOTE — Plan of Care (Signed)

## 2023-01-16 NOTE — Progress Notes (Signed)
PHARMACIST - PHYSICIAN COMMUNICATION DR:  Mayford Knife CONCERNING: Antibiotic IV to Oral Route Change Policy  RECOMMENDATION: This patient is receiving azithromycin by the intravenous route.  Based on criteria approved by the Pharmacy and Therapeutics Committee, the antibiotic(s) is/are being converted to the equivalent oral dose form(s).   DESCRIPTION: These criteria include: Patient being treated for a respiratory tract infection, urinary tract infection, cellulitis or clostridium difficile associated diarrhea if on metronidazole The patient is not neutropenic and does not exhibit a GI malabsorption state The patient is eating (either orally or via tube) and/or has been taking other orally administered medications for a least 24 hours The patient is improving clinically and has a Tmax < 100.5   Elliot Gurney, PharmD, BCPS Clinical Pharmacist  01/16/2023 10:20 AM

## 2023-01-16 NOTE — Progress Notes (Signed)
*  PRELIMINARY RESULTS* Echocardiogram 2D Echocardiogram has been performed.  Carolyne Fiscal 01/16/2023, 12:36 PM

## 2023-01-16 NOTE — Progress Notes (Signed)
PROGRESS NOTE    Brett Riley  [name] DOB: 08-15-1935 DOA: 01/14/2023 PCP: Dale [place], MD   Assessment & Plan:   Principal Problem:   CVA (cerebral vascular accident) [facility]) Active Problems:   Vertebral osteomyelitis continue doxycycline until 10/24/2022 [facility])   Essential hypertension, benign   Dyslipidemia   Persistent atrial fibrillation (HCC)   Heart failure with preserved ejection fraction (HCC)   History of CAD (coronary artery disease)   CKD (chronic kidney disease), stage III (HCC)  Assessment and Plan: B/l pneumonia: as per CXR. Continue on IV rocephin, azithromycin, bronchodilators & encourage incentive spirometry.   Somnolence & encephalopathy: mental status waxes and wanes. Often sleeps all day and stays awake all night. No previous dx of dementia. Pt's wife request neuro consult. B1, B12, HIV TSH ordered.   CVA: r/o as per MRI.   Possible fibromuscular dysplasia: as per CTA head & neck. No inpatient work-up needed as per vasc surg and pt can f/u w/ previously scheduled outpatient appointment   Vertebral osteomyelitis:  completed abx course. Now with chronic lower extremity weakness. PT/OT recs home health   Chronic right knee pain: CT right knee shows Ovoid, mass-like mixed attenuation area abutting the anteromedial cortex of the distal femoral diaphysis, possible hematoma vs neoplasm, and recommends MRI. MRI right femur ordered.    CKDIIIa: Cr is trending down from day prior. Will continue to monitor    Hx of CAD: no CP. Continue on statin, plavix, metoprolol    Chronic diastolic CHF: appears euvolemic. Continue on home dose of metoprolol, statin. Monitor I/Os   Persistent a. fib: continue on home dose of metoprolol, diltiazem, eliquis    HLD: continue on statin   HTN: continue on home dose of diltiazem, metoprolol      DVT prophylaxis: eliquis  Code Status: DNR Family Communication: discussed pt's care w/ pt's family at bedside and  answered their questions  Disposition Plan: likely d/c back home w/ HH   Level of care: Telemetry Medical Status is: Inpatient Remains inpatient appropriate because: severity of illness    Consultants:    Procedures:   Antimicrobials: rocephin, azithromycin   Subjective: Pt c/o fatigue   Objective: Vitals:   01/15/23 1628 01/15/23 2034 01/16/23 0002 01/16/23 0400  BP: 136/84 (!) 143/89 122/78 (!) 137/96  Pulse: 89 86 83 82  Resp: 17 14 18 18   Temp:  98.6 F (37 C) 98.3 F (36.8 C) 98.8 F (37.1 C)  TempSrc:    Oral  SpO2: 98% 96% 99% 99%  Weight:      Height:        Intake/Output Summary (Last 24 hours) at 01/16/2023 0816 Last data filed at 01/16/2023 0300 Gross per 24 hour  Intake 590 ml  Output --  Net 590 ml   Filed Weights   01/14/23 1112  Weight: 83.9 kg    Examination:  General exam: Appears comfortable  Respiratory system: diminished breath sounds b/l  Cardiovascular system: S1/S2+. No rubs or clicks  Gastrointestinal system: Abd is soft, NT, ND & hypoactive bowel sounds Central nervous system: Awake & alert. Moves all extremities  Psychiatry: judgement and insight appears improved. Flat mood and affect    Data Reviewed: I have personally reviewed following labs and imaging studies  CBC: Recent Labs  Lab 01/14/23 1114 01/16/23 0444  WBC 8.5 7.2  HGB 11.1* 9.2*  HCT 34.9* 27.6*  MCV 99.1 94.5  PLT 250 215   Basic Metabolic Panel: Recent Labs  Lab 01/14/23  1114 01/16/23 0444  NA 137 137  K 3.8 3.7  CL 104 104  CO2 23 25  GLUCOSE 106* 96  BUN 25* 24*  CREATININE 1.05 0.97  CALCIUM 10.7* 10.1   GFR: Estimated Creatinine Clearance: 61.1 mL/min (by C-G formula based on SCr of 0.97 mg/dL). Liver Function Tests: Recent Labs  Lab 01/14/23 1114  AST 17  ALT 12  ALKPHOS 76  BILITOT 0.4  PROT 7.0  ALBUMIN 4.0   No results for input(s): "LIPASE", "AMYLASE" in the last 168 hours. No results for input(s): "AMMONIA" in the last  168 hours. Coagulation Profile: Recent Labs  Lab 01/14/23 1114  INR 1.3*   Cardiac Enzymes: No results for input(s): "CKTOTAL", "CKMB", "CKMBINDEX", "TROPONINI" in the last 168 hours. BNP (last 3 results) No results for input(s): "PROBNP" in the last 8760 hours. HbA1C: No results for input(s): "HGBA1C" in the last 72 hours. CBG: No results for input(s): "GLUCAP" in the last 168 hours. Lipid Profile: Recent Labs    01/15/23 0417  CHOL 122  HDL 28*  LDLCALC 63  TRIG 161*  CHOLHDL 4.4   Thyroid Function Tests: No results for input(s): "TSH", "T4TOTAL", "FREET4", "T3FREE", "THYROIDAB" in the last 72 hours. Anemia Panel: No results for input(s): "VITAMINB12", "FOLATE", "FERRITIN", "TIBC", "IRON", "RETICCTPCT" in the last 72 hours. Sepsis Labs: No results for input(s): "PROCALCITON", "LATICACIDVEN" in the last 168 hours.  No results found for this or any previous visit (from the past 240 hour(s)).       Radiology Studies: MR BRAIN WO CONTRAST  Result Date: 01/14/2023 CLINICAL DATA:  Initial evaluation for neuro deficit, stroke suspected. EXAM: MRI HEAD WITHOUT CONTRAST TECHNIQUE: Multiplanar, multiecho pulse sequences of the brain and surrounding structures were obtained without intravenous contrast. COMPARISON:  CT from earlier the same day. FINDINGS: Brain: Generalized age-related cerebral atrophy. Patchy and confluent T2/FLAIR hyperintensity involving the periventricular and deep white matter both cerebral hemispheres, consistent with chronic small vessel ischemic disease, moderate to advanced in nature. Superimposed remote lacunar infarct noted at the right lentiform nucleus. No evidence for acute or subacute ischemia. Gray-white matter differentiation maintained. No acute or chronic intracranial blood products. No mass lesion, midline shift or mass effect. No hydrocephalus or extra-axial fluid collection. Partially empty sella noted. Vascular: Major intracranial vascular flow  voids are maintained. Skull and upper cervical spine: Craniocervical junction within normal limits. Bone marrow signal intensity normal. Small lipoma noted at the right forehead. Sinuses/Orbits: Globes orbital soft tissues within normal limits. Small volume layering secretions noted within the right sphenoid sinus. Paranasal sinuses are otherwise largely clear. Trace bilateral mastoid effusions, of doubtful significance. Other: None. IMPRESSION: 1. No acute intracranial abnormality. 2. Age-related cerebral atrophy with moderate to advanced chronic microvascular ischemic disease. Electronically Signed   By: Rise Mu M.D.   On: 01/14/2023 21:55   CT ANGIO HEAD NECK W WO CM  Result Date: 01/14/2023 CLINICAL DATA:  Altered mental status, stroke suspected EXAM: CT ANGIOGRAPHY HEAD AND NECK WITH AND WITHOUT CONTRAST TECHNIQUE: Multidetector CT imaging of the head and neck was performed using the standard protocol during bolus administration of intravenous contrast. Multiplanar CT image reconstructions and MIPs were obtained to evaluate the vascular anatomy. Carotid stenosis measurements (when applicable) are obtained utilizing NASCET criteria, using the distal internal carotid diameter as the denominator. RADIATION DOSE REDUCTION: This exam was performed according to the departmental dose-optimization program which includes automated exposure control, adjustment of the mA and/or kV according to patient size and/or use of  iterative reconstruction technique. CONTRAST:  75mL OMNIPAQUE IOHEXOL 350 MG/ML SOLN COMPARISON:  No prior CTA head available, correlation is made with 01/04/2016 CTA neck and 01/14/2023 CT head FINDINGS: CT HEAD FINDINGS For noncontrast findings, please see same day CT head. CTA NECK FINDINGS Aortic arch: The arch vessel origins are incompletely included in the field of view. In correlation with the prior CTA, there appears to be a two-vessel arch with a common origin of the  brachiocephalic and left common carotid arteries. Imaged portion shows no evidence of aneurysm or dissection. Aortic atherosclerosis. 70% stenosis at the origin of the right subclavian artery (series 6, image 219). Right carotid system: No evidence of dissection, occlusion, or hemodynamically significant stenosis (greater than 50%). Atherosclerotic disease in the common carotid, at the bifurcation, and in the proximal ICA is not hemodynamically significant. Left carotid system: No evidence of dissection, occlusion, or hemodynamically significant stenosis (greater than 50%). S Atherosclerotic disease in the common carotid, at the bifurcation, and in the proximal ICA is not hemodynamically significant. Possible mild beading mid to distal left ICA (series 8, image 124). Vertebral arteries: Severe, near occlusive stenosis at the origin of the bilateral vertebral arteries, which has progressed from the prior exam. Additional moderate stenosis in the right V2 segment. No evidence of dissection. Skeleton: No acute osseous abnormality. Degenerative changes in the cervical spine. Other neck: No acute finding. Upper chest: No focal pulmonary opacity or pleural effusion. Review of the MIP images confirms the above findings CTA HEAD FINDINGS Anterior circulation: Both internal carotid arteries are patent to the termini, with mild stenosis in the left supraclinoid ICA and severe stenosis in the right supraclinoid ICA. A1 segments patent. Normal anterior communicating artery. Anterior cerebral arteries are patent to their distal aspects without significant stenosis. No M1 stenosis or occlusion. MCA branches perfused to their distal aspects without significant stenosis. Posterior circulation: Vertebral arteries patent to the vertebrobasilar junction without significant stenosis. Posterior inferior cerebellar arteries patent proximally. Basilar patent to its distal aspect without significant stenosis. Superior cerebellar arteries  patent proximally. Patent P1 segments. PCAs perfused to their distal aspects without significant stenosis. The bilateral posterior communicating arteries are not visualized. Venous sinuses: As permitted by contrast timing, patent. Anatomic variants: None significant. No evidence of aneurysm or vascular malformation. Review of the MIP images confirms the above findings IMPRESSION: 1. No intracranial large vessel occlusion. Severe stenosis in the right supraclinoid ICA and mild stenosis in the left supraclinoid ICA. 2. Severe, near occlusive stenosis at the origin of the bilateral vertebral arteries, which has progressed from the prior exam. Additional moderate stenosis in the right V2 segment. 3. 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 4. Possible mild beading in the mid to distal left ICA, which can be seen in the setting of fibromuscular dysplasia. 5. Aortic atherosclerosis. Aortic Atherosclerosis (ICD10-I70.0). Electronically Signed   By: Wiliam Ke M.D.   On: 01/14/2023 19:07   DG Chest Portable 1 View  Result Date: 01/14/2023 CLINICAL DATA:  Altered mental status. EXAM: PORTABLE CHEST 1 VIEW COMPARISON:  Chest x-ray 09/23/2022. FINDINGS: Mild streaky bibasilar opacities. No visible pleural effusions or pneumothorax. Similar cardiomediastinal silhouette. No acute bony abnormality. IMPRESSION: Mild streaky bibasilar opacities, which could represent atelectasis, aspiration, and/or pneumonia. Electronically Signed   By: Feliberto Harts M.D.   On: 01/14/2023 17:35   CT HEAD WO CONTRAST  Result Date: 01/14/2023 CLINICAL DATA:  Mental status change, unknown cause. EXAM: CT HEAD WITHOUT CONTRAST TECHNIQUE: Contiguous axial  images were obtained from the base of the skull through the vertex without intravenous contrast. RADIATION DOSE REDUCTION: This exam was performed according to the departmental dose-optimization program which includes automated exposure control, adjustment of the mA  and/or kV according to patient size and/or use of iterative reconstruction technique. COMPARISON:  Brain MRI 08/01/2022. FINDINGS: Brain: Generalized cerebral and cerebellar atrophy. Patchy and ill-defined hypoattenuation within the cerebral white matter, nonspecific but compatible with moderate chronic small vessel ischemic disease. There is no acute intracranial hemorrhage. No demarcated cortical infarct. No extra-axial fluid collection. No evidence of an intracranial mass. No midline shift. Vascular: No hyperdense vessel.  Atherosclerotic calcifications. Skull: No calvarial fracture or aggressive osseous lesion. Sinuses/Orbits: No mass or acute finding within the imaged orbits. Mild mucosal thickening within the bilateral sphenoid sinuses. Other: Incidentally noted small right forehead lipoma. Trace fluid within the bilateral mastoid air cells. IMPRESSION: 1.  No evidence of an acute intracranial abnormality. 2. Parenchymal atrophy and chronic small vessel ischemic disease. 3. Mild mucosal thickening within the bilateral sphenoid sinuses. Electronically Signed   By: Jackey Loge D.O.   On: 01/14/2023 14:54        Scheduled Meds:  apixaban  2.5 mg Oral BID   clopidogrel  75 mg Oral Daily   diltiazem  360 mg Oral Daily   metoprolol succinate  100 mg Oral Daily   pantoprazole  40 mg Oral q morning   Continuous Infusions:  azithromycin 500 mg (01/15/23 1311)   cefTRIAXone (ROCEPHIN)  IV 2 g (01/15/23 1235)     LOS: 2 days       Charise Killian, MD [facility] Pager [phone]  If 7PM-7AM, please contact night-coverage [email or web address] 01/16/2023, 8:16 AM

## 2023-01-17 DIAGNOSIS — R410 Disorientation, unspecified: Secondary | ICD-10-CM | POA: Diagnosis not present

## 2023-01-17 DIAGNOSIS — R4781 Slurred speech: Secondary | ICD-10-CM

## 2023-01-17 DIAGNOSIS — J189 Pneumonia, unspecified organism: Secondary | ICD-10-CM | POA: Diagnosis not present

## 2023-01-17 LAB — BASIC METABOLIC PANEL WITH GFR
Anion gap: 9 (ref 5–15)
BUN: 23 mg/dL (ref 8–23)
CO2: 23 mmol/L (ref 22–32)
Calcium: 9.8 mg/dL (ref 8.9–10.3)
Chloride: 102 mmol/L (ref 98–111)
Creatinine, Ser: 0.87 mg/dL (ref 0.61–1.24)
GFR, Estimated: >60 mL/min
Glucose, Bld: 93 mg/dL (ref 70–99)
Potassium: 4 mmol/L (ref 3.5–5.1)
Sodium: 134 mmol/L — ABNORMAL LOW (ref 135–145)

## 2023-01-17 LAB — CBC
HCT: 28.9 % — ABNORMAL LOW (ref 39.0–52.0)
Hemoglobin: 9.7 g/dL — ABNORMAL LOW (ref 13.0–17.0)
MCH: 31.6 pg (ref 26.0–34.0)
MCHC: 33.6 g/dL (ref 30.0–36.0)
MCV: 94.1 fL (ref 80.0–100.0)
Platelets: 228 10*3/uL (ref 150–400)
RBC: 3.07 MIL/uL — ABNORMAL LOW (ref 4.22–5.81)
RDW: 14.8 % (ref 11.5–15.5)
WBC: 7.4 10*3/uL (ref 4.0–10.5)
nRBC: 0 % (ref 0.0–0.2)

## 2023-01-17 LAB — TSH: TSH: 5.36 u[IU]/mL — ABNORMAL HIGH (ref 0.350–4.500)

## 2023-01-17 LAB — HIV ANTIBODY (ROUTINE TESTING W REFLEX): HIV Screen 4th Generation wRfx: NONREACTIVE

## 2023-01-17 MED ORDER — QUETIAPINE FUMARATE 25 MG PO TABS: 12.5000 mg | ORAL_TABLET | Freq: Every evening | ORAL | Status: DC | PRN

## 2023-01-17 MED ORDER — QUETIAPINE FUMARATE 25 MG PO TABS: 25.0000 mg | ORAL_TABLET | Freq: Every day | ORAL | Status: DC

## 2023-01-17 MED ORDER — QUETIAPINE FUMARATE 25 MG PO TABS: 12.5000 mg | ORAL_TABLET | Freq: Every day | ORAL | Status: DC

## 2023-01-17 MED ORDER — THIAMINE MONONITRATE 100 MG PO TABS
100.0000 mg | ORAL_TABLET | Freq: Every day | ORAL | Status: DC
  Administered 2023-01-17 – 2023-01-21 (×5): 100 mg via ORAL
  Filled 2023-01-17 (×5): qty 1

## 2023-01-17 MED ORDER — QUETIAPINE FUMARATE 25 MG PO TABS
12.5000 mg | ORAL_TABLET | Freq: Every day | ORAL | Status: DC
  Administered 2023-01-17 – 2023-01-22 (×6): 12.5 mg via ORAL
  Filled 2023-01-17 (×7): qty 1

## 2023-01-17 NOTE — Progress Notes (Signed)
Physical Therapy Treatment Patient Details Name: Brett Riley MRN: [id number] DOB: 01/30/1936 Today's Date: 01/17/2023   History of Present Illness Pt is an 87 y.o. male with medical history significant of diastolic CHF, CAD, PAF on Eliquis, vertebral osteomyelitis, and HTN presenting with suspected CVA with dysarthria and AMS.  Imaging completed with CVA ruled out. Pt now has B/I pneumonia as well    PT Comments  Pt was pleasant and willing to participate during the session and put forth good effort throughout. He continues to need Max A +2 for bed mobility, also needing Max A +2 for multiple STS's performed this session with sara-steady equipment, with standing tolerance ~10-20 seconds. Max multimodal cues needed for pt to use BUE's to pull up to sit/stand in sara-steady. Pt continues to display poor trunk control when seated, needing intermittent Min A with brief instances with no physical assist but heavy use of BUE's to maintain seated balance. Pt will benefit from continued PT services upon discharge to safely address deficits listed in patient problem list for decreased caregiver assistance and eventual return to PLOF.      If plan is discharge home, recommend the following: Two people to help with walking and/or transfers;A lot of help with bathing/dressing/bathroom;Assistance with cooking/housework;Assistance with feeding;Direct supervision/assist for medications management;Direct supervision/assist for financial management;Assist for transportation;Help with stairs or ramp for entrance   Can travel by private vehicle        Equipment Recommendations  Hospital bed    Recommendations for Other Services       Precautions / Restrictions Precautions Precautions: Fall Restrictions Weight Bearing Restrictions: Yes RLE Weight Bearing: Weight bearing as tolerated     Mobility  Bed Mobility Overal bed mobility: Needs Assistance Bed Mobility: Supine to Sit, Sit to Supine      Supine to sit: +2 for physical assistance, Max assist Sit to supine: +2 for physical assistance, Max assist   General bed mobility comments: +2 Max A for BLE and trunk control, VC's for sequencing, poor trunk stability without significant BUE support when seated    Transfers Overall transfer level: Needs assistance Equipment used:  (Sara-Steady) Transfers: Sit to/from Stand Sit to Stand: +2 physical assistance, Mod assist, +2 safety/equipment           General transfer comment: Performed x4 STS with use of Sara-steady equipment. Max multi-modal cues for pt to pull up to perform STS successfully with additional cues for forwards lean    Ambulation/Gait               General Gait Details: unable and unsafe at this time   Stairs             Wheelchair Mobility     Tilt Bed    Modified Rankin (Stroke Patients Only)       Balance Overall balance assessment: Needs assistance Sitting-balance support: Bilateral upper extremity supported, Feet supported Sitting balance-Leahy Scale: Poor Sitting balance - Comments: Intermittent Min a with pt having significant BUE use to maintain     Standing balance-Leahy Scale: Poor Standing balance comment: Max A for STS's perform withb use of Sara-steady                            Cognition Arousal: Alert Behavior During Therapy: WFL for tasks assessed/performed, Flat affect Overall Cognitive Status: Difficult to assess  Exercises      General Comments        Pertinent Vitals/Pain Pain Assessment Pain Assessment: Faces Faces Pain Scale: Hurts a little bit Pain Location: R knee with ROM/WBing Pain Descriptors / Indicators: Discomfort Pain Intervention(s): Monitored during session, Limited activity within patient's tolerance    Home Living                          Prior Function            PT Goals (current goals can now be  found in the care plan section) Progress towards PT goals: Progressing toward goals    Frequency    Min 1X/week      PT Plan      Co-evaluation              AM-PAC PT "6 Clicks" Mobility   Outcome Measure  Help needed turning from your back to your side while in a flat bed without using bedrails?: Total Help needed moving from lying on your back to sitting on the side of a flat bed without using bedrails?: Total Help needed moving to and from a bed to a chair (including a wheelchair)?: Total Help needed standing up from a chair using your arms (e.g., wheelchair or bedside chair)?: Total Help needed to walk in hospital room?: Total Help needed climbing 3-5 steps with a railing? : Total 6 Click Score: 6    End of Session Equipment Utilized During Treatment: Gait belt Activity Tolerance: Patient limited by lethargy Patient left: in bed;with call bell/phone within reach;with bed alarm set;with family/visitor present Nurse Communication: Mobility status PT Visit Diagnosis: Muscle weakness (generalized) (M62.81);Difficulty in walking, not elsewhere classified (R26.2)     Time: 5784-6962 PT Time Calculation (min) (ACUTE ONLY): 14 min  Charges:                            Cecile Sheerer, SPT 01/17/23, 1:47 PM

## 2023-01-17 NOTE — Progress Notes (Signed)
PROGRESS NOTE    Brett Riley  JWJ:[id number] DOB: [birth year] DOA: 01/14/2023 PCP: Dale Rosine, MD   Assessment & Plan:   Principal Problem:   CVA (cerebral vascular accident) [facility]) Active Problems:   Vertebral osteomyelitis continue doxycycline until 10/24/2022 [facility])   Essential hypertension, benign   Dyslipidemia   Persistent atrial fibrillation (HCC)   Heart failure with preserved ejection fraction (HCC)   History of CAD (coronary artery disease)   CKD (chronic kidney disease), stage III (HCC)  Assessment and Plan: B/l pneumonia: as per CXR. Continue on IV rocephin, azithromycin, bronchodilators & encourage incentive spirometry  Somnolence & encephalopathy: mental status waxes and wanes. Often sleeps all day and stays awake all night. No previous dx of dementia. Pt's wife request neuro consult. TSH is elevated. FT3, FT4 ordered. B1 is pending. B12 ordered. Will start low dose seroquel   CVA: r/o as per MRI.   Possible fibromuscular dysplasia: as per CTA head & neck. No inpatient work-up needed as per vasc surg and pt can f/u w/ previously scheduled outpatient appointment   Vertebral osteomyelitis:  completed abx course. Now with chronic lower extremity weakness. PT/OT recs home health   Chronic right knee pain: CT right knee shows Ovoid, mass-like mixed attenuation area abutting the anteromedial cortex of the distal femoral diaphysis, possible hematoma vs neoplasm, and recommends MRI. MRI right femur shows subacute hematoma. Continue w/ supportive care    CKDIIIa: Cr is better than baseline. Will continue to monitor    Hx of CAD: no CP. Continue on metoprolol, plavix, statin    Chronic diastolic CHF: appears euvolemic. Continue on home dose of metoprolol, statin. Monitor I/Os    Persistent a. fib: continue on home of diltiazem, metoprolol, eliquis    HLD: continue on statin   HTN: continue on home dose of metoprolol, diltiazem       DVT prophylaxis: eliquis   Code Status: DNR Family Communication: discussed pt's care w/ pt's family at bedside and answered their questions  Disposition Plan: likely d/c back home w/ HH   Level of care: Telemetry Medical Status is: Inpatient Remains inpatient appropriate because: severity of illness    Consultants:    Procedures:   Antimicrobials: rocephin, azithromycin   Subjective: Pt c/o malaise   Objective: Vitals:   01/16/23 1627 01/16/23 2027 01/17/23 0025 01/17/23 0425  BP: 135/71 (!) 117/92 127/70 (!) 158/84  Pulse: 77 82 88 92  Resp: 16 18 18 18   Temp: 98.3 F (36.8 C) 99.1 F (37.3 C) 98.8 F (37.1 C) 98 F (36.7 C)  TempSrc:  Oral Oral Oral  SpO2: 95% 97% 96% 96%  Weight:      Height:        Intake/Output Summary (Last 24 hours) at 01/17/2023 0816 Last data filed at 01/16/2023 1048 Gross per 24 hour  Intake 240 ml  Output --  Net 240 ml   Filed Weights   01/14/23 1112  Weight: 83.9 kg    Examination:  General exam: Appears calm & comfortable  Respiratory system: decreased breath sounds b/l  Cardiovascular system: S1/S2+. No rubs or clicks  Gastrointestinal system: Abd is soft, NT, ND & normal bowel sounds  Central nervous system: Alert & awake. Moves all extremities  Psychiatry: judgement and insight is not at baseline. Flat mood and affect    Data Reviewed: I have personally reviewed following labs and imaging studies  CBC: Recent Labs  Lab 01/14/23 1114 01/16/23 0444 01/17/23 0507  WBC 8.5 7.2 7.4  HGB 11.1* 9.2* 9.7*  HCT 34.9* 27.6* 28.9*  MCV 99.1 94.5 94.1  PLT 250 215 228   Basic Metabolic Panel: Recent Labs  Lab 01/14/23 1114 01/16/23 0444 01/17/23 0507  NA 137 137 134*  K 3.8 3.7 4.0  CL 104 104 102  CO2 23 25 23   GLUCOSE 106* 96 93  BUN 25* 24* 23  CREATININE 1.05 0.97 0.87  CALCIUM 10.7* 10.1 9.8   GFR: Estimated Creatinine Clearance: 68.1 mL/min (by C-G formula based on SCr of 0.87 mg/dL). Liver Function Tests: Recent Labs   Lab 01/14/23 1114  AST 17  ALT 12  ALKPHOS 76  BILITOT 0.4  PROT 7.0  ALBUMIN 4.0   No results for input(s): "LIPASE", "AMYLASE" in the last 168 hours. No results for input(s): "AMMONIA" in the last 168 hours. Coagulation Profile: Recent Labs  Lab 01/14/23 1114  INR 1.3*   Cardiac Enzymes: No results for input(s): "CKTOTAL", "CKMB", "CKMBINDEX", "TROPONINI" in the last 168 hours. BNP (last 3 results) No results for input(s): "PROBNP" in the last 8760 hours. HbA1C: No results for input(s): "HGBA1C" in the last 72 hours. CBG: No results for input(s): "GLUCAP" in the last 168 hours. Lipid Profile: Recent Labs    01/15/23 0417  CHOL 122  HDL 28*  LDLCALC 63  TRIG 643*  CHOLHDL 4.4   Thyroid Function Tests: Recent Labs    01/17/23 0507  TSH 5.360*   Anemia Panel: No results for input(s): "VITAMINB12", "FOLATE", "FERRITIN", "TIBC", "IRON", "RETICCTPCT" in the last 72 hours. Sepsis Labs: No results for input(s): "PROCALCITON", "LATICACIDVEN" in the last 168 hours.  No results found for this or any previous visit (from the past 240 hour(s)).       Radiology Studies: ECHOCARDIOGRAM COMPLETE  Result Date: 01/16/2023    ECHOCARDIOGRAM REPORT   Patient Name:   Brett Riley Date of Exam: 01/16/2023 Medical Rec #:  [id number]      Height:       72.0 in Accession #:    [id number]     Weight:       185.0 lb Date of Birth:  January 30, 1936     BSA:          2.061 m Patient Age:    87 years       BP:           152/89 mmHg Patient Gender: M              HR:           96 bpm. Exam Location:  [facility] Procedure: 2D Echo, Cardiac Doppler and Color Doppler Indications:     TIA  History:         Patient has prior history of Echocardiogram examinations, most                  recent 09/02/2022. CAD, TIA and Stroke, Arrythmias:Atrial                  Fibrillation, Signs/Symptoms:Dizziness/Lightheadedness, Fatigue                  and Bacteremia; Risk Factors:Hypertension and Dyslipidemia.                   CKD.  Sonographer:     Mikki Harbor Referring Phys:  [phone] Francoise Schaumann [place] Diagnosing Phys: Julien Nordmann MD  Sonographer Comments: Technically difficult study due to poor echo windows. IMPRESSIONS  1. Left ventricular ejection fraction, by estimation,  is 40 to 45%. The left ventricle has mildly decreased function. The left ventricle demonstrates global hypokinesis. There is mild left ventricular hypertrophy. Left ventricular diastolic parameters are consistent with Grade I diastolic dysfunction (impaired relaxation).  2. Right ventricular systolic function is normal. The right ventricular size is normal. There is normal pulmonary artery systolic pressure.  3. The mitral valve is normal in structure. Mild to moderate mitral valve regurgitation. No evidence of mitral stenosis.  4. The aortic valve is tricuspid. Aortic valve regurgitation is not visualized. Aortic valve sclerosis is present, with no evidence of aortic valve stenosis.  5. The inferior vena cava is normal in size with greater than 50% respiratory variability, suggesting right atrial pressure of 3 mmHg. FINDINGS  Left Ventricle: Left ventricular ejection fraction, by estimation, is 40 to 45%. The left ventricle has mildly decreased function. The left ventricle demonstrates global hypokinesis. The left ventricular internal cavity size was normal in size. There is  mild left ventricular hypertrophy. Left ventricular diastolic parameters are consistent with Grade I diastolic dysfunction (impaired relaxation). Right Ventricle: The right ventricular size is normal. No increase in right ventricular wall thickness. Right ventricular systolic function is normal. There is normal pulmonary artery systolic pressure. The tricuspid regurgitant velocity is 1.94 m/s, and  with an assumed right atrial pressure of 8 mmHg, the estimated right ventricular systolic pressure is 23.1 mmHg. Left Atrium: Left atrial size was normal in size. Right Atrium: Right  atrial size was normal in size. Pericardium: There is no evidence of pericardial effusion. Mitral Valve: The mitral valve is normal in structure. There is mild thickening of the mitral valve leaflet(s). Mild mitral annular calcification. Mild to moderate mitral valve regurgitation. No evidence of mitral valve stenosis. MV peak gradient, 5.3 mmHg. The mean mitral valve gradient is 1.0 mmHg. Tricuspid Valve: The tricuspid valve is normal in structure. Tricuspid valve regurgitation is mild . No evidence of tricuspid stenosis. Aortic Valve: The aortic valve is tricuspid. Aortic valve regurgitation is not visualized. Aortic valve sclerosis is present, with no evidence of aortic valve stenosis. Aortic valve mean gradient measures 3.0 mmHg. Aortic valve peak gradient measures 6.0  mmHg. Aortic valve area, by VTI measures 3.26 cm. Pulmonic Valve: The pulmonic valve was normal in structure. Pulmonic valve regurgitation is not visualized. No evidence of pulmonic stenosis. Aorta: The aortic root is normal in size and structure. Venous: The inferior vena cava is normal in size with greater than 50% respiratory variability, suggesting right atrial pressure of 3 mmHg. IAS/Shunts: No atrial level shunt detected by color flow Doppler.  LEFT VENTRICLE PLAX 2D LVIDd:         4.40 cm     Diastology LVIDs:         2.90 cm     LV e' medial:    5.33 cm/s LV PW:         1.40 cm     LV E/e' medial:  5.0 LV IVS:        1.40 cm     LV e' lateral:   7.18 cm/s LVOT diam:     2.10 cm     LV E/e' lateral: 3.7 LV SV:         78 LV SV Index:   38 LVOT Area:     3.46 cm  LV Volumes (MOD) LV vol d, MOD A2C: 52.1 ml LV vol d, MOD A4C: 92.7 ml LV vol s, MOD A2C: 31.2 ml LV vol s, MOD A4C: 49.7  ml LV SV MOD A2C:     20.9 ml LV SV MOD A4C:     92.7 ml LV SV MOD BP:      34.1 ml RIGHT VENTRICLE RV Basal diam:  3.25 cm RV Mid diam:    2.80 cm RV S prime:     12.20 cm/s LEFT ATRIUM           Index        RIGHT ATRIUM           Index LA diam:      3.60 cm  1.75 cm/m   RA Area:     12.90 cm LA Vol (A2C): 51.1 ml 24.79 ml/m  RA Volume:   21.90 ml  10.63 ml/m LA Vol (A4C): 42.1 ml 20.43 ml/m  AORTIC VALVE                    PULMONIC VALVE AV Area (Vmax):    2.49 cm     PV Vmax:       1.03 m/s AV Area (Vmean):   2.58 cm     PV Peak grad:  4.2 mmHg AV Area (VTI):     3.26 cm AV Vmax:           122.00 cm/s AV Vmean:          82.000 cm/s AV VTI:            0.239 m AV Peak Grad:      6.0 mmHg AV Mean Grad:      3.0 mmHg LVOT Vmax:         87.80 cm/s LVOT Vmean:        61.100 cm/s LVOT VTI:          0.225 m LVOT/AV VTI ratio: 0.94  AORTA Ao Root diam: 3.70 cm Ao Asc diam:  3.40 cm MITRAL VALVE                TRICUSPID VALVE MV Area (PHT): 4.80 cm     TR Peak grad:   15.1 mmHg MV Area VTI:   6.28 cm     TR Vmax:        194.00 cm/s MV Peak grad:  5.3 mmHg MV Mean grad:  1.0 mmHg     SHUNTS MV Vmax:       1.15 m/s     Systemic VTI:  0.22 m MV Vmean:      51.5 cm/s    Systemic Diam: 2.10 cm MV Decel Time: 158 msec MV E velocity: 26.70 cm/s MV A velocity: 109.00 cm/s MV E/A ratio:  0.24 Julien Nordmann MD Electronically signed by Julien Nordmann MD Signature Date/Time: 01/16/2023/3:50:43 PM    Final    CT KNEE RIGHT WO CONTRAST  Result Date: 01/16/2023 CLINICAL DATA:  Chronic right knee pain EXAM: CT OF THE RIGHT KNEE WITHOUT CONTRAST TECHNIQUE: Multidetector CT imaging of the right knee was performed according to the standard protocol. Multiplanar CT image reconstructions were also generated. RADIATION DOSE REDUCTION: This exam was performed according to the departmental dose-optimization program which includes automated exposure control, adjustment of the mA and/or kV according to patient size and/or use of iterative reconstruction technique. COMPARISON:  X-ray 08/27/2022 FINDINGS: Bones/Joint/Cartilage No acute fracture. No dislocation. Mild medial compartment joint space narrowing. Small knee joint effusion with high attenuation fluid. No fat-fluid level. There is  periosteal elevation along the anteromedial aspect of the distal femoral diaphysis. No cortical erosion. No cortical thickening.  No lytic or sclerotic bone lesion is identified. Ligaments Suboptimally assessed by CT. Muscles and Tendons No acute musculotendinous abnormality by CT. Generalized muscle atrophy. Soft tissues Ovoid, mass-like mixed attenuation area abutting the anteromedial cortex of the distal femoral diaphysis measuring approximately 5.5 x 2.3 x 3.9 cm (series 4, image 46). This structure overlies the site of periosteal elevation. No internal calcification. Atherosclerotic vascular calcifications are present. IMPRESSION: 1. Ovoid, mass-like mixed attenuation area abutting the anteromedial cortex of the distal femoral diaphysis measuring approximately 5.5 x 2.3 x 3.9 cm. There is underlying periosteal elevation of the adjacent femur. Appearance by CT is nonspecific and could represent a soft tissue hematoma. Neoplasm at this location is a concern and a follow-up MRI of the femur with and without IV contrast is recommended to further characterize. 2. Small knee joint effusion with high attenuation fluid, suggestive of blood products. 3. Mild medial compartment joint space narrowing. Electronically Signed   By: Duanne Guess D.O.   On: 01/16/2023 08:41        Scheduled Meds:  apixaban  2.5 mg Oral BID   azithromycin  500 mg Oral Daily   clopidogrel  75 mg Oral Daily   diltiazem  360 mg Oral Daily   metoprolol succinate  100 mg Oral Daily   pantoprazole  40 mg Oral q morning   rosuvastatin  10 mg Oral Daily   thiamine  100 mg Oral Daily   Continuous Infusions:  cefTRIAXone (ROCEPHIN)  IV 2 g (01/16/23 1215)     LOS: 3 days       Charise Killian, MD [facility] Pager [phone]  If 7PM-7AM, please contact night-coverage [email or web address] 01/17/2023, 8:16 AM

## 2023-01-17 NOTE — Care Management Important Message (Signed)
Important Message  Patient Details  Name: Brett Riley MRN: [id number] Date of Birth: Sep 17, 1935   Important Message Given:  Yes - Medicare IM  HCPOA Terri Lamm returned my call and I reviewed the Important Message from Medicare with her. She was aware of these rights and thanked me for calling.    Olegario Messier A Jabarie Pop 01/17/2023, 9:21 AM

## 2023-01-17 NOTE — Consult Note (Signed)
Neurology Consultation Reason for Consult: Confusion Requesting Physician: Fabienne Bruns  CC: Lethargy   History is obtained from: Wife and chart review, limited from patient  HPI: Brett Riley is a 87 y.o. 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  Unfortunately this was complicated by sepsis with MSSA and L1/2 discitis with abscess (non-surgically managed) as well as a new diagnosis of atrial fibrillation  Then readmitted 6/16 - 7-/24 for shortness of breath worsening over 3 days. 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 He completed steroids on 12/01/2022 -- workup was notable for positive ANA (no titer noted), and mildly elevated RNP, as well as elevated IgA and very elevated ESR (>100 consistently) and CRP. There was consideration of possible lung biopsy in the future.   Then admitted 8/9-8/13 after presenting for left flank hematoma,  readmitted for pneumonia (afebrile, no leukocytosis, RVP negative but still on steroid taper at that time). Initially received vancomycin and cefepime and de-escalated to IV ceftriaxone and completed 5 days course, while maintaining doxycycline for MSSA bacteremia/osteomyelitis as well as bactrim for PJP prophylaxis while on high dose steroids   Due to increasing knee pain,  had joint aspiration 11/27/2022, with recurrent aspiration 12/26/2022. "Patient's aspirate results showed WBC count of 150 with 62% neutrophils. There were no crystals. Gram stain and culture were negative. Given that the aspirate was bloody, I am recommending a CT scan of the right knee to further evaluate for occult trauma that would explain his current symptoms and findings of the aspiration. If the CT scan is negative, we could consider corticosteroid injection to the knee joint. "  On MRI today he was found to have a subacute hematoma involving the vastus medialis  11/08 was seen for sleep issues starting in SNF after one of his hospitalizations, worsening by nocturia 4-5x nightly, with development of day/night confusion.  Wife denies any confusion with his prior hospitalizations, although medicine notes from 5/28 noted that he did have some hallucinations the night prior that was felt to be secondary to hospital-acquired delirium/sundowning and improved over the next several days.  She feels that his confusion gradually worsened over several weeks prior to his admission.  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  Follow-up notes with his PCP do not mention any cognitive concerns interval early November  ROS: Limited by mental status  Past Medical History:  Diagnosis Date   Cancer (HCC)    skin cancer basal   Carotid arterial disease (HCC)    a. 02/2016 L CEA; b. 01/2017 U/S: patent  LICA, 1-39% RICA.   Celiac artery stenosis (HCC)    Coronary artery disease    a. 01/2016 MV: mild apical/basal inferior,  apical lateral, mid anterolateral, and mid inferolateral ischemia. EF 57%; b. 02/2016 Cath: LM 40ost, LAD 70p/m, 20d, D1 95 (small), LCX nl, RCA 32m, RPDA 90 (small), EF 55-65%-->med Rx. Rec CABG for recurrent symptoms.   Hearing loss    History of echocardiogram    a. 02/2016 Echo: EF 50-55%, no rwma, mild MR.   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   Osteoarthritis, hip, bilateral    Left > Right   Past Surgical History:  Procedure Laterality Date   AORTIC INTERVENTION N/A 07/24/2022   Procedure: AORTIC INTERVENTION;  Surgeon: Renford Dills, MD;  Location: [facility];  Service: Cardiovascular;  Laterality: N/A;   CARDIAC CATHETERIZATION N/A 01/19/2016   Procedure: Left Heart Cath and Coronary Angiography;  Surgeon: Iran Ouch, MD;  Location: [facility];  Service: Cardiovascular;  Laterality: N/A;   COLONOSCOPY     CYSTOSCOPY/URETEROSCOPY/HOLMIUM LASER/STENT PLACEMENT Right 04/13/2021   Procedure: CYSTOSCOPY/URETEROSCOPY/HOLMIUM LASER/STENT PLACEMENT;  Surgeon: Sondra Come, MD;  Location: [facility];  Service: Urology;  Laterality: Right;   ENDARTERECTOMY Left 02/15/2016   Procedure: ENDARTERECTOMY CAROTID;  Surgeon: Annice Needy, MD;  Location: [facility];  Service: Vascular;  Laterality: Left;   RIGHT HEART CATH N/A 09/19/2022   Procedure: RIGHT HEART CATH;  Surgeon: Yvonne Kendall, MD;  Location: [facility];  Service: Cardiovascular;  Laterality: N/A;   TEE WITHOUT CARDIOVERSION N/A 08/05/2022   Procedure: TRANSESOPHAGEAL ECHOCARDIOGRAM;  Surgeon: Debbe Odea, MD;  Location: [facility];  Service: Cardiovascular;  Laterality: N/A;    Family History  Problem Relation Age of Onset   Stroke Mother    Heart disease Father        MI   Heart attack Father    Breast cancer Sister    Colon cancer Neg Hx    Prostate cancer Neg Hx      Social History:  reports  that he has never smoked. He has never used smokeless tobacco. He reports that he does not drink alcohol and does not use drugs.   Exam: Current vital signs: BP 139/77 (BP Location: Left Arm)   Pulse 76   Temp 98.6 F (37 C)   Resp 15   Ht 6' (1.829 m)   Wt 83.9 kg   SpO2 97%   BMI 25.09 kg/m  Vital signs in last 24 hours: Temp:  [98 F (36.7 C)-99.1 F (37.3 C)] 98.6 F (37 C) (11/15 1223) Pulse Rate:  [76-92] 76 (11/15 1638) Resp:  [15-18] 15 (11/15 1638) BP: (117-158)/(70-92) 139/77 (11/15 1638) SpO2:  [93 %-97 %] 97 % (11/15 1638)   Physical Exam  Constitutional: Appears well-developed and well-nourished.  Psych: Affect flat Eyes: No scleral injection HENT: No oropharyngeal obstruction.  MSK: no major joint deformities.  Cardiovascular: Perfusing extremities well Respiratory: No grossly audible wheezing GI: Soft.  No distension. There is no tenderness.   Neuro: Mental Status: Patient is quite sleepy at the time of my evaluation Patient is able to give very limited history, language testing limited by fatigue, but able to name the days of the week backwards.  At times very slow to respond ("I'm thinking" but does not answer the question after an extended period.) Difficulty naming some low-frequency  objects such as frame. Cranial Nerves: II: Visual Fields are full. Pupils are equal, round, and reactive to light.   III,IV, VI: EOMI with baseline mild right eye ptosis V: Facial sensation is symmetric to temperature VII: Facial movement is symmetric.  VIII: hearing is hard of hearing at baseline X: Uvula elevates symmetrically XI: Shoulder shrug is symmetric. XII: tongue is midline without atrophy or fasciculations.  Motor: Tone is normal. Bulk is normal.  No drift in the bilateral upper extremities, 5/5 throughout bilateral upper extremities within limits of overall deconditioning.  Right lower extremity is not fully antigravity but can briefly lift the left leg  antigravity (3/5 at the hip).  Pain limited at the right knee.  Left knee at least 4/5 extension and flexion.  Plantar and dorsiflexion at least 3/5 bilaterally with some limitation secondary to poor attention/concentration Sensory: Sensation is symmetric to light touch and temperature in the arms and legs. Deep Tendon Reflexes: 3+ and symmetric in the patellae, 2+ and symmetric brachioradialis  Cerebellar: Finger-to-nose intact bilaterally Gait:  Deferred D    I have reviewed labs in epic and the results pertinent to this consultation are:  Lab Results  Component Value Date   VITAMINB12 1,595 (H) 10/14/2022    Lab Results  Component Value Date   TSH 5.360 (H) 01/17/2023  T3, T4 pending  HIV negative  B1 pending   Latest Reference Range & Units 08/23/22 10:39  Anti Nuclear Antibody (ANA) Negative  Positive !  Anti JO-1 0.0 - 0.9 AI <0.2  CENTROMERE AB SCREEN 0.0 - 0.9 AI <0.2  ds DNA Ab 0 - 9 IU/mL <1  GBM Ab 0.0 - 0.9 units <0.2  Cytoplasmic (C-ANCA) Neg:<1:20 titer <1:20  P-ANCA Neg:<1:20 titer <1:20  Atypical P-ANCA titer Neg:<1:20 titer <1:20  ENA SM Ab Ser-aCnc 0.0 - 0.9 AI <0.2  Chromatin Ab SerPl-aCnc 0.0 - 0.9 AI <0.2  Anti-MPO Antibodies 0.0 - 0.9 units <0.2  Anti-PR3 Antibodies 0.0 - 0.9 units 0.2  C3 Complement 82 - 167 mg/dL 161  Complement C4, Body Fluid 12 - 38 mg/dL 35  IgG (Immunoglobin G), Serum 603 - 1,613 mg/dL 0,960  IgM (Immunoglobulin M), Srm 15 - 143 mg/dL 35  IgA 61 - 454 mg/dL 098 (H)  Ribonucleic Protein 0.0 - 0.9 AI 2.2 (H)  SSA (Ro) (ENA) Antibody, IgG 0.0 - 0.9 AI <0.2  SSB (La) (ENA) Antibody, IgG 0.0 - 0.9 AI <0.2  Scleroderma (Scl-70) (ENA) Antibody, IgG 0.0 - 0.9 AI <0.2  !: Data is abnormal (H): Data is abnormally high   Latest Reference Range & Units 08/07/22 05:15 08/22/22 16:55 08/24/22 10:50 08/25/22 06:30 08/26/22 04:40 09/12/22 07:55 09/19/22 19:07 09/22/22 11:33  CRP <1.0 mg/dL 11.9 (H) 14.7 (H) 7.8 (H) 5.3 (H) 3.3 (H) 3.1  (H) 21.0 (H) 7.8 (H)  (H): Data is abnormally high   Latest Reference Range & Units 08/07/22 05:15 08/22/22 16:55 09/12/22 07:55 09/19/22 19:07  Sed Rate 0 - 20 mm/hr 106 (H) 126 (H) 106 (H) 127 (H)  (H): Data is abnormally high  I have reviewed the images obtained:  MRI femur: 1. The mixed attenuation lesion seen anteromedially in the distal thigh on recent CT corresponds with a subacute hematoma involving the vastus medialis muscle. No associated abnormal enhancement or soft tissue mass. 2. Subjacent heterotopic ossification and benign periosteal reaction of the distal femur. 3. No acute osseous findings or aggressive osseous lesion. 4. Femoropopliteal atherosclerosis.  MRI brain personally reviewed, agree with radiology,  reviewed with wife at bedside 1. No acute intracranial abnormality. 2. Age-related cerebral atrophy with moderate to advanced chronic microvascular ischemic disease.  CTA head and neck 1. No intracranial large vessel occlusion. Severe stenosis in the right supraclinoid ICA and mild stenosis in the left supraclinoid ICA. 2. Severe, near occlusive stenosis at the origin of the bilateral vertebral arteries, which has progressed from the prior exam. Additional moderate stenosis in the right V2 segment. 3. 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 4. Possible mild beading in the mid to distal left ICA, which can be seen in the setting of fibromuscular dysplasia. 5. Aortic atherosclerosis.  MRI Lumbar spine 08/02/2022 personally reviewed, agree with radiology:   1. Findings concerning for discitis osteomyelitis at L1-L2, with a small ventral epidural abscess and a small amount of prevertebral phlegmon. The abscess does not cause significant spinal canal stenosis but likely narrows the right lateral recess, possibly affecting descending right L2 nerve root. 2. Increased T2 signal and hyperenhancement in the L4-L5 and L5-S1 disc spaces,  concerning for early discitis. No evidence of epidural phlegmon or abscess at these levels. 3. L2-L3 possible facet septic arthritis on the left. 4. Mild neural foraminal narrowing bilaterally at L3-L4 and L4-L5 and on the right at L5-S1.  CT Chest/abdomen/pelvis 10/11/2022 1. New airspace disease in the right lower lobe concerning for pneumonia. 2. Additional ground-glass opacities in the right lower lobe, lingula and left lower lobe have increased. Findings may be infectious/inflammatory. 3. Trace bilateral pleural effusions have decreased from prior. 4. Gaseous distention of the sigmoid colon without definite evidence for volvulus or obstruction. 5. Mild body wall edema. 6. Stable focal aneurysm versus dissection of the proximal aortic arch measuring up to 4.8 cm Recommend semi-annual imaging followup by CTA or MRA and referral to cardiothoracic surgery if not already obtained. This recommendation follows 2010 ACCF/AHA/AATS/ACR/ASA/SCA/SCAI/SIR/STS/SVM Guidelines for the Diagnosis and Management of Patients With Thoracic Aortic Disease. Circulation. 2010; 121: E454-U98. Aortic aneurysm NOS (ICD10-I71.9) 7. Stable density adjacent to the anterior neck of the pancreas, possibly an enlarged lymph node or exophytic lesion from the pancreas. 8. Stable erosive changes along the endplates at L1-L2 corresponding to patient's osteomyelitis/discitis seen on prior MRI. 9. Aortic atherosclerosis.  01/14/23 Mild streaky bibasilar opacities, which could represent atelectasis, aspiration, and/or pneumonia.   Impression: Overall the description of his mental status sounds most consistent with delirium.  He did seem to have a mild delirium during his first hospitalization and with with multiple subsequent hospitalizations and multiple medical problems as detailed above I would not be surprised if he was more susceptible to delirium at this point.  Multifactorial in nature from the multifactorial  medical conditions as well as due to pain from his back issues, knee issues, and deconditioning.  Potential for steroid-induced myopathy given he was on high-dose steroids for a long time.  However given his mental status gradually worsened after stopping steroids and there was concern for potential autoimmune process with his highly elevated ESR and CRP I do think it is reasonable to repeat these studies as well as repeat ANA with reflex to titer.  Given the primary concern was for an autoimmune process of the lungs (versus possibly amiodarone related toxicity) would also repeat CT chest since he is again being treated for pneumonia -- otherwise reassuring white count and fever curve, so doubt significant worsening spinal cord process especially given stable imaging on CT abdomen pelvis 10/11/2022  Recommendations: - Repeat ESR, CRP, ANA w/  titer - Repeat CT chest  - Low-dose Seroquel 12.5 mg nightly with additional 12.5 as needed - Neurology will follow  Brooke Dare MD-PhD [facility] [phone] [facility] coverage for [facility] is from 8 AM to 4 AM in-house and 4 PM to 8 PM by telephone/video. 8 PM to 8 AM emergent questions or overnight urgent questions should be addressed to Teleneurology On-call or [facility] neurohospitalist; contact information can be found on AMION

## 2023-01-17 NOTE — Care Management Important Message (Signed)
Important Message  Patient Details  Name: Brett Riley MRN: [id number] Date of Birth: 1936-01-20   Important Message Given:  Other (see comment)  I left a message for patient's [name] at [phone] to review this form. I will await a return call.    Olegario Messier A Shanetha Bradham 01/17/2023, 9:18 AM

## 2023-01-18 ENCOUNTER — Encounter: Payer: Self-pay | Admitting: Radiology

## 2023-01-18 ENCOUNTER — Inpatient Hospital Stay: Payer: Medicare HMO

## 2023-01-18 DIAGNOSIS — R410 Disorientation, unspecified: Secondary | ICD-10-CM | POA: Diagnosis not present

## 2023-01-18 DIAGNOSIS — J189 Pneumonia, unspecified organism: Secondary | ICD-10-CM | POA: Diagnosis not present

## 2023-01-18 LAB — GLUCOSE, CAPILLARY: Glucose-Capillary: 107 mg/dL — ABNORMAL HIGH (ref 70–99)

## 2023-01-18 LAB — CBC
HCT: 27.9 % — ABNORMAL LOW (ref 39.0–52.0)
Hemoglobin: 9.3 g/dL — ABNORMAL LOW (ref 13.0–17.0)
MCH: 31.7 pg (ref 26.0–34.0)
MCHC: 33.3 g/dL (ref 30.0–36.0)
MCV: 95.2 fL (ref 80.0–100.0)
Platelets: 197 10*3/uL (ref 150–400)
RBC: 2.93 MIL/uL — ABNORMAL LOW (ref 4.22–5.81)
RDW: 14.7 % (ref 11.5–15.5)
WBC: 7.4 10*3/uL (ref 4.0–10.5)
nRBC: 0 % (ref 0.0–0.2)

## 2023-01-18 LAB — BASIC METABOLIC PANEL WITH GFR
Anion gap: 7 (ref 5–15)
BUN: 24 mg/dL — ABNORMAL HIGH (ref 8–23)
CO2: 24 mmol/L (ref 22–32)
Calcium: 9.8 mg/dL (ref 8.9–10.3)
Chloride: 104 mmol/L (ref 98–111)
Creatinine, Ser: 0.96 mg/dL (ref 0.61–1.24)
GFR, Estimated: >60 mL/min
Glucose, Bld: 98 mg/dL (ref 70–99)
Potassium: 3.3 mmol/L — ABNORMAL LOW (ref 3.5–5.1)
Sodium: 135 mmol/L (ref 135–145)

## 2023-01-18 LAB — LACTATE DEHYDROGENASE: LDH: 113 U/L (ref 98–192)

## 2023-01-18 LAB — APTT: aPTT: 49 s — ABNORMAL HIGH (ref 24–36)

## 2023-01-18 LAB — C-REACTIVE PROTEIN: CRP: 0.7 mg/dL

## 2023-01-18 LAB — SEDIMENTATION RATE: Sed Rate: 60 mm/h — ABNORMAL HIGH (ref 0–20)

## 2023-01-18 LAB — T4, FREE: Free T4: 1.07 ng/dL (ref 0.61–1.12)

## 2023-01-18 LAB — CK: Total CK: 25 U/L — ABNORMAL LOW (ref 49–397)

## 2023-01-18 LAB — VITAMIN B12: Vitamin B-12: 1067 pg/mL — ABNORMAL HIGH (ref 180–914)

## 2023-01-18 MED ORDER — HEPARIN BOLUS VIA INFUSION
2500.0000 [IU] | Freq: Once | INTRAVENOUS | Status: AC
  Administered 2023-01-18: 2500 [IU] via INTRAVENOUS
  Filled 2023-01-18: qty 2500

## 2023-01-18 MED ORDER — GADOBUTROL 1 MMOL/ML IV SOLN
8.0000 mL | Freq: Once | INTRAVENOUS | Status: AC | PRN
  Administered 2023-01-18: 8 mL via INTRAVENOUS

## 2023-01-18 MED ORDER — POTASSIUM CHLORIDE CRYS ER 20 MEQ PO TBCR
40.0000 meq | EXTENDED_RELEASE_TABLET | Freq: Once | ORAL | Status: AC
  Administered 2023-01-18: 40 meq via ORAL
  Filled 2023-01-18: qty 2

## 2023-01-18 MED ORDER — HEPARIN (PORCINE) 25000 UT/250ML-% IV SOLN
1350.0000 [IU]/h | INTRAVENOUS | Status: DC
  Administered 2023-01-18: 850 [IU]/h via INTRAVENOUS
  Administered 2023-01-20: 1050 [IU]/h via INTRAVENOUS
  Administered 2023-01-21: 1350 [IU]/h via INTRAVENOUS
  Filled 2023-01-18 (×4): qty 250

## 2023-01-18 NOTE — Progress Notes (Cosign Needed)
No answer from RN after multiple calls regarding MRI scan.

## 2023-01-18 NOTE — Progress Notes (Signed)
Secured chat sent to RN about MRI

## 2023-01-18 NOTE — Progress Notes (Signed)
PHARMACY - ANTICOAGULATION CONSULT NOTE  Pharmacy Consult for heparin infusion Indication: atrial fibrillation  No Known Allergies  Patient Measurements: Height: 6' (182.9 cm) Weight: 83.9 kg (184 lb 15.5 oz) IBW/kg (Calculated) : 77.6 Heparin Dosing Weight: 83.9 kg  Vital Signs: Temp: 98.9 F (37.2 C) (11/16 2010) Temp Source: Oral (11/16 2010) BP: 116/79 (11/16 2010) Pulse Rate: 71 (11/16 2010)  Labs: Recent Labs    01/16/23 0444 01/17/23 0507 01/18/23 0336 01/18/23 1548 01/18/23 1937  HGB 9.2* 9.7* 9.3*  --   --   HCT 27.6* 28.9* 27.9*  --   --   PLT 215 228 197  --   --   APTT  --   --   --   --  49*  CREATININE 0.97 0.87 0.96  --   --   CKTOTAL  --   --   --  25*  --     Estimated Creatinine Clearance: 61.7 mL/min (by C-G formula based on SCr of 0.96 mg/dL).   Medical History: Past Medical History:  Diagnosis Date   Cancer (HCC)    skin cancer basal   Carotid arterial disease (HCC)    a. 02/2016 L CEA; b. 01/2017 U/S: patent LICA, 1-39% RICA.   Celiac artery stenosis (HCC)    Coronary artery disease    a. 01/2016 MV: mild apical/basal inferior, apical lateral, mid anterolateral, and mid inferolateral ischemia. EF 57%; b. 02/2016 Cath: LM 40ost, LAD 70p/m, 20d, D1 95 (small), LCX nl, RCA 43m, RPDA 90 (small), EF 55-65%-->med Rx. Rec CABG for recurrent symptoms.   Hearing loss    History of echocardiogram    a. 02/2016 Echo: EF 50-55%, no rwma, mild MR.   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 Pharmacy consulted for Heparin dosing. Patient was taking Apixaban 2.5mg   bid prior to admission for afib, last dose was on 11/15 2038  Date Time HL/aPTT Rate/Comment  11/16 1937 aPTT 49 850 u/hr; Subtherapeutic  Goal of Therapy:  Heparin level 0.3-0.7 units/ml aPTT 66-102 seconds Monitor platelets by anticoagulation protocol: Yes   Plan:  Give heparin bolus of 2500 units x1 Increase heparin infusion to 1100 units/hour Check aPTT level in 8 hours Use aPTT to adjust dose until correlation with HL -Will check daily CBC while on Heparin infusion   Thank you for involving pharmacy in this patient's care.   Rockwell Alexandria, PharmD Clinical Pharmacist 01/18/2023 8:53 PM

## 2023-01-18 NOTE — Progress Notes (Signed)
PHARMACY - ANTICOAGULATION CONSULT NOTE  Pharmacy Consult for heparin infusion Indication: atrial fibrillation  No Known Allergies  Patient Measurements: Height: 6' (182.9 cm) Weight: 83.9 kg (184 lb 15.5 oz) IBW/kg (Calculated) : 77.6 Heparin Dosing Weight: 83.9 kg  Vital Signs: Temp: 98 F (36.7 C) (11/16 1100) Temp Source: Oral (11/16 0404) BP: 155/93 (11/16 1100) Pulse Rate: 94 (11/16 1100)  Labs: Recent Labs    01/16/23 0444 01/17/23 0507 01/18/23 0336  HGB 9.2* 9.7* 9.3*  HCT 27.6* 28.9* 27.9*  PLT 215 228 197  CREATININE 0.97 0.87 0.96    Estimated Creatinine Clearance: 61.7 mL/min (by C-G formula based on SCr of 0.96 mg/dL).   Medical History: Past Medical History:  Diagnosis Date   Cancer (HCC)    skin cancer basal   Carotid arterial disease (HCC)    a. 02/2016 L CEA; b. 01/2017 U/S: patent LICA, 1-39% RICA.   Celiac artery stenosis (HCC)    Coronary artery disease    a. 01/2016 MV: mild apical/basal inferior, apical lateral, mid anterolateral, and mid inferolateral ischemia. EF 57%; b. 02/2016 Cath: LM 40ost, LAD 70p/m, 20d, D1 95 (small), LCX nl, RCA 80m, RPDA 90 (small), EF 55-65%-->med Rx. Rec CABG for recurrent symptoms.   Hearing loss    History of echocardiogram    a. 02/2016 Echo: EF 50-55%, no rwma, mild MR.   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   Osteoarthritis, hip, bilateral    Left > Right    Assessment: 86yo male w/ PMH of CAD, /p stents, left carotid stenosis s/p CEA (02/2016) hypertension, hyperlipidemia, bilateral hip osteoarthritis, hearing loss, vertebral osteomyelitis, CKD stage III, heart failure with preserved EF, atrial fibrillation  with AMS. Pharmacy consulted for Heparin dosing. Patient was taking Apixaban 2.5mg  bid prior to admission for afib, last dose was on 11/15 2038  Goal of Therapy:  Heparin level 0.3-0.7 units/ml aPTT 66-102  seconds Monitor platelets by anticoagulation protocol: Yes   Plan:  - start heparin at 850 units/hr (rate based on data from recent admission) - Will check aPTT 8 hrs   - Will recheck HL and aPTT daily use aPTT to adjust dose until correlation with HL -Will check daily CBC while on Heparin infusion   Thank you for involving pharmacy in this patient's care.  [name] 01/18/2023,11:00 AM

## 2023-01-18 NOTE — Progress Notes (Addendum)
NEUROLOGY CONSULT FOLLOW UP NOTE   Date of service: January 18, 2023 Patient Name: Brett Riley MRN:  [id number] DOB:  1936/01/17  Brief HPI  VIRL DONSON is a 87 y.o. male  has a past medical history of Cancer (HCC), Carotid arterial disease (HCC), Celiac artery stenosis (HCC), Coronary artery disease, Hearing loss, History of echocardiogram, History of kidney stones, Hypercholesterolemia, Hypertension, Intraosseous ganglion, and Osteoarthritis, hip, bilateral. who presented with altered mental status   Interval Hx/subjective   Appreciate curbside discussion with PCCM.  In reviewing the chronicity of his lung pathology, and sick/19 there was initial concern for immuno toxicity based on his CT chest completed 6/19, although amiodarone had been started less than 1 month ago, which is typically too short of a time course.  He did get a few days of steroids (40 mg dexamethasone 6/19, then 8 mg for 3 days, 4 mg for 1 day, then methylprednisolone 20 mg on 6/25, 16 mg on 6/26 and 4 mg through 6/29).  CT chest actually looked better on 7/4 (it may be unusual for amiodarone induced toxicity to improve so quickly).  On 7/17 his CT chest looked worse again at which time IV steroids were restarted with a long slow taper as described in my note yesterday.  Discussed with wife, she was not aware of the concern for possible autoimmune process underlying and need for follow-up with rheumatology/pulmonology for the same  She does note he always sleeps with his head slumped over at home long prior to being ill  Wife reports that patient was able to stand several times with physical therapy either yesterday or the day before and that she feels that overall his strength has been stable since his first hospitalization though he has become deconditioned  Vitals   Vitals:   01/17/23 1223 01/17/23 1638 01/17/23 2040 01/18/23 0404  BP: 125/72 139/77 136/80 (!) 148/79  Pulse: 85 76 84 91  Resp: 16 15 16     Temp: 98.6 F (37 C)  98.2 F (36.8 C) 98.1 F (36.7 C)  TempSrc:   Oral Oral  SpO2: 97% 97% 99% 96%  Weight:      Height:         Body mass index is 25.09 kg/m.  Physical Exam   Constitutional: Appears fatigued and chronically ill, sleepy Psych: Affect flat Eyes: No scleral injection HENT: No oropharyngeal obstruction.  MSK: no major joint deformities.  Cardiovascular: Perfusing extremities well Respiratory: No grossly audible wheezing GI: Soft.  No distension. There is no tenderness.  Skin: Scattered bruising throughout as well as many very small petechial lesions   Neuro: Mental Status: Stable mental status from 11/15, very sleepy, slow to respond, but follows simple commands, oriented to age but not month, unable to give significant details of situation Cranial Nerves: II: Visual Fields are full. Pupils are equal, round, and reactive to light.   III,IV, VI: EOMI with baseline mild right eye ptosis greater than left eye ptosis V: Facial sensation is symmetric to light touch VII: Facial movement is symmetric.  VIII: hearing is hard of hearing at baseline X: Uvula elevates symmetrically XI: Shoulder shrug is symmetric. XII: tongue is midline without atrophy or fasciculations.  Motor: Tone is normal. Bulk is normal.  No drift in the bilateral upper extremities, 5/5 throughout bilateral upper extremities within limits of overall deconditioning.  Right lower extremity is not fully antigravity but can briefly lift the left leg antigravity (3/5 at the hip).  Pain limited  at the right knee.  Left knee at least 4/5 extension and flexion.  Plantar and dorsiflexion at least 4/5 bilaterally with some limitation secondary to poor attention/concentration Sensory: Sensation is symmetric to light touch and temperature in the arms and legs. Deep Tendon Reflexes: 3+ and symmetric in the patellae, 2+ and symmetric brachioradialis  Cerebellar: Finger-to-nose intact bilaterally Gait:   Deferred  Labs and Diagnostic Imaging    Basic Metabolic Panel: Recent Labs  Lab 01/14/23 1114 01/16/23 0444 01/17/23 0507 01/18/23 0336  NA 137 137 134* 135  K 3.8 3.7 4.0 3.3*  CL 104 104 102 104  CO2 23 25 23 24   GLUCOSE 106* 96 93 98  BUN 25* 24* 23 24*  CREATININE 1.05 0.97 0.87 0.96  CALCIUM 10.7* 10.1 9.8 9.8    CBC: Recent Labs  Lab 01/14/23 1114 01/16/23 0444 01/17/23 0507 01/18/23 0336  WBC 8.5 7.2 7.4 7.4  HGB 11.1* 9.2* 9.7* 9.3*  HCT 34.9* 27.6* 28.9* 27.9*  MCV 99.1 94.5 94.1 95.2  PLT 250 215 228 197    Coagulation Studies: No results for input(s): "LABPROT", "INR" in the last 72 hours.          Component Ref Range & Units 03:36 (01/18/23) 4 mo ago (09/19/22) 4 mo ago (09/12/22) 4 mo ago (08/22/22) 5 mo ago (08/07/22)  Sed Rate 0 - 20 mm/hr 60 High  127 High  CM 106 High  CM 126 High  CM 106 High        Lipid Panel:  Lab Results  Component Value Date   LDLCALC 63 01/15/2023   HgbA1c:  Lab Results  Component Value Date   HGBA1C 5.4 08/18/2022   Urine Drug Screen: No results found for: "LABOPIA", "COCAINSCRNUR", "LABBENZ", "AMPHETMU", "THCU", "LABBARB"  Alcohol Level No results found for: "ETH" INR  Lab Results  Component Value Date   INR 1.3 (H) 01/14/2023   APTT  Lab Results  Component Value Date   APTT 81 (H) 08/19/2022   AED levels: No results found for: "PHENYTOIN", "ZONISAMIDE", "LAMOTRIGINE", "LEVETIRACETA"  CT Chest 01/18/2023: 1. Previously seen dependent bibasilar opacities have improved. Mild residual linear opacities and mild bronchiectasis in these this region likely represents post infectious scarring. No confluent consolidation. 2. Similar contour abnormality of the ascending aorta, representing either a penetrating ulcer or aneurysm. Recommend follow-up CT angiography when clinically feasible. 3.  Aortic Atherosclerosis (ICD10-I70.0).  CT angio Head and Neck with contrast(Personally reviewed): 1. No  intracranial large vessel occlusion. Severe stenosis in the right supraclinoid ICA and mild stenosis in the left supraclinoid ICA. 2. Severe, near occlusive stenosis at the origin of the bilateral vertebral arteries, which has progressed from the prior exam. Additional moderate stenosis in the right V2 segment. 3. 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 4. Possible mild beading in the mid to distal left ICA, which can be seen in the setting of fibromuscular dysplasia. 5. Aortic atherosclerosis.  MRI Brain(Personally reviewed): 1. No acute intracranial abnormality. 2. Age-related cerebral atrophy with moderate to advanced chronic microvascular ischemic disease.   Impression   GAMBIT GOLIA is a 87 y.o. man with multiple recurrent hospitalizations since mid May.  Of note he presented with a penetrating aortic ulcer and highly elevated ESR and CRP, as well as and apparently steroid responsive pulmonary process, possibly amiodarone toxicity related although the rapidity with which it developed and the time course of response to steroids may be atypical.  In this clinical setting of  vasculitis has been considered; while his initial antibody testing was largely negative, RNP was positive and I do think repeating antibody testing may be useful as initial testing can be negative early in the disease course. Certainly his initial ESR/CRP may have been elevated in the setting of MSSA bacteremia, discitis etc. but these inflammatory markers have remained elevated despite resolution of infection  I am not sure that his mental status is attributable to pneumonia given no leukocytosis or fever and overall improvement in his chest CT.  Certainly delirium is still on the differential but given the gradual worsening of his mental status after stopping steroids must consider an autoimmune process.  Due to concern for possible vasculitis I will repeat vessel imaging with MRA head as well as  obtain a contrasted MRI brain.  To confirm safety of proceeding with lumbar puncture will perform lumbar spine with and without contrast secondary to his recent discitis. Will also test for cryptococcal antigen given he has been immunosuppressed as well as testing for hepatitis which can mimic vasculitis presentations and which also informs long-term immunosuppression decisions (for this reason will also check TB)  Recommendations  -MRI brain with and without contrast -MRI lumbar spine with and without contrast -MRA head without contrast -Cryptococcal antigen, RPR, Hepatitis panel, QuantiFERON gold TB testing -LDH, aldolase, CK, -[facility] ANA 12+ profile, Myomarker 3 panel -[facility] encephalopathy autoimmune paraneoplastic panel (ENS2) -beta-2 glycoproteins antibodies, cardiolipin antibodies, ANCA -SPEP & IFE w/ QIG -UPEP/UIFE/Light Chains/TP -c3, c4 not sent (difficult to interpret in the setting of anticoagulation) -Heparin drip pending lumbar puncture which could be performed the soonest on 11/18 given last dose of Eliquis 11/15 evening -Depending on clinical course, preliminary laboratory results including CSF results and imaging may consider reinitiating steroids versus close outpatient follow-up with pulmonary and rheumatology ______________________________________________________________________   Signed, Gordy Councilman, MD [facility]  Discussed with primary team in person

## 2023-01-18 NOTE — Progress Notes (Addendum)
 PROGRESS NOTE    Brett Riley  WUJ:[id number] DOB: Apr 14, 1935 DOA: 01/14/2023 PCP: [place], MD   Assessment & Plan:   Principal Problem:   CVA (cerebral vascular accident) [facility]) Active Problems:   Vertebral osteomyelitis continue doxycycline until 10/24/2022 [facility])   Essential hypertension, benign   Dyslipidemia   Persistent atrial fibrillation (HCC)   Heart failure with preserved ejection fraction (HCC)   History of CAD (coronary artery disease)   CKD (chronic kidney disease), stage III (HCC)  Assessment and Plan: B/l pneumonia: as per CXR. Completed azithromycin course. Continue on IV rocephin, bronchodilators & encourage incentive spirometry.   Somnolence & encephalopathy: mental status waxes and wanes. Often sleeps all day and stays awake all night. No previous dx of dementia. TSH is elevated, FT4 is WNL. FT3 is pending. B1, B12 are pending. Continue on low dose seroquel as per neuro. Will likely have LP on 01/20/23 as per neuro. Neuro following and recs apprec   CVA: r/o as per MRI.   Possible fibromuscular dysplasia: as per CTA head & neck. No inpatient work-up needed as per vasc surg and pt can f/u w/ previously scheduled outpatient appointment   Vertebral osteomyelitis:  completed abx course. Now with chronic lower extremity weakness. PT/OT recs home health   Chronic right knee pain: CT right knee shows Ovoid, mass-like mixed attenuation area abutting the anteromedial cortex of the distal femoral diaphysis, possible hematoma vs neoplasm, and recommends MRI. MRI right femur shows subacute hematoma. Continue w/ supportive care    CKDIIIa: Cr is better than baseline   Hx of CAD: no CP. Continue on metoprolol, plavix, statin    Chronic diastolic CHF: appears euvolemic. Continue on home dose of statin, metoprolol. Monitor I/Os    Persistent a. fib: continue on home dose of metoprolol, diltiazem. Holding home eliquis & start IV heparin for likely LP on 01/20/23 as  per neuro    HLD: continue on statin   HTN: continue on home dose of diltiazem, metoprolol      DVT prophylaxis: IV heparin Code Status: DNR Family Communication: discussed pt's care w/ pt's family at bedside and answered their questions  Disposition Plan: likely d/c back home w/ HH   Level of care: Telemetry Medical Status is: Inpatient Remains inpatient appropriate because: severity of illness    Consultants:    Procedures:   Antimicrobials: rocephin  Subjective: Pt c/o malaise   Objective: Vitals:   01/17/23 1223 01/17/23 1638 01/17/23 2040 01/18/23 0404  BP: 125/72 139/77 136/80 (!) 148/79  Pulse: 85 76 84 91  Resp: 16 15 16    Temp: 98.6 F (37 C)  98.2 F (36.8 C) 98.1 F (36.7 C)  TempSrc:   Oral Oral  SpO2: 97% 97% 99% 96%  Weight:      Height:        Intake/Output Summary (Last 24 hours) at 01/18/2023 0853 Last data filed at 01/17/2023 1120 Gross per 24 hour  Intake 480 ml  Output --  Net 480 ml   Filed Weights   01/14/23 1112  Weight: 83.9 kg    Examination:  General exam: appears lethargic  Respiratory system: diminished breath sounds b/l  Cardiovascular system: S1 & S2+. No rubs or clicks  Gastrointestinal system: abd is soft, NT, ND & hypoactive bowel sounds   Central nervous system: lethargic.  Psychiatry: judgement and insight not at baseline. Flat mood and affect    Data Reviewed: I have personally reviewed following labs and imaging studies  CBC:  Recent Labs  Lab 01/14/23 1114 01/16/23 0444 01/17/23 0507 01/18/23 0336  WBC 8.5 7.2 7.4 7.4  HGB 11.1* 9.2* 9.7* 9.3*  HCT 34.9* 27.6* 28.9* 27.9*  MCV 99.1 94.5 94.1 95.2  PLT 250 215 228 197   Basic Metabolic Panel: Recent Labs  Lab 01/14/23 1114 01/16/23 0444 01/17/23 0507 01/18/23 0336  NA 137 137 134* 135  K 3.8 3.7 4.0 3.3*  CL 104 104 102 104  CO2 23 25 23 24   GLUCOSE 106* 96 93 98  BUN 25* 24* 23 24*  CREATININE 1.05 0.97 0.87 0.96  CALCIUM 10.7* 10.1  9.8 9.8   GFR: Estimated Creatinine Clearance: 61.7 mL/min (by C-G formula based on SCr of 0.96 mg/dL). Liver Function Tests: Recent Labs  Lab 01/14/23 1114  AST 17  ALT 12  ALKPHOS 76  BILITOT 0.4  PROT 7.0  ALBUMIN 4.0   No results for input(s): "LIPASE", "AMYLASE" in the last 168 hours. No results for input(s): "AMMONIA" in the last 168 hours. Coagulation Profile: Recent Labs  Lab 01/14/23 1114  INR 1.3*   Cardiac Enzymes: No results for input(s): "CKTOTAL", "CKMB", "CKMBINDEX", "TROPONINI" in the last 168 hours. BNP (last 3 results) No results for input(s): "PROBNP" in the last 8760 hours. HbA1C: No results for input(s): "HGBA1C" in the last 72 hours. CBG: No results for input(s): "GLUCAP" in the last 168 hours. Lipid Profile: No results for input(s): "CHOL", "HDL", "LDLCALC", "TRIG", "CHOLHDL", "LDLDIRECT" in the last 72 hours.  Thyroid Function Tests: Recent Labs    01/17/23 0507 01/18/23 0336  TSH 5.360*  --   FREET4  --  1.07   Anemia Panel: No results for input(s): "VITAMINB12", "FOLATE", "FERRITIN", "TIBC", "IRON", "RETICCTPCT" in the last 72 hours. Sepsis Labs: No results for input(s): "PROCALCITON", "LATICACIDVEN" in the last 168 hours.  No results found for this or any previous visit (from the past 240 hour(s)).       Radiology Studies: MR FEMUR RIGHT W WO CONTRAST  Result Date: 01/17/2023 CLINICAL DATA:  Bone mass or bone pain, femur, benign features on xray Ovoid, mass-like mixed attenuation area abutting the anteromedial cortex of the distal femoral diaphysis on CT Chronic knee pain. EXAM: MRI OF THE RIGHT FEMUR WITHOUT AND WITH CONTRAST TECHNIQUE: Multiplanar, multisequence MR imaging of the right femur was performed both before and after administration of intravenous contrast. CONTRAST:  8mL GADAVIST GADOBUTROL 1 MMOL/ML IV SOLN COMPARISON:  CT of the right knee 01/15/2023. Radiographs 11/27/2022, 08/27/2022 and 07/31/2022. FINDINGS:  Bones/Joint/Cartilage Examination includes most of the right thigh. The right hip and knee are incompletely visualized. There is no evidence of acute fracture, dislocation or aggressive osseous lesion. As seen on recent CT, there is mild periosteal elevation and heterotopic ossification along the anteromedial aspect of the distal femoral metadiaphysis. Adjacent soft tissue findings are described below. No cortical thinning, marrow edema or abnormal osseous enhancement. Small knee joint effusion. Ligaments Not relevant for exam/indication. Muscles and Tendons As seen on CT, there is a complex fluid collection anteromedially in the distal thigh which involves the vastus medialis muscle. This measures approximately 3.2 x 1.9 cm transverse and extends approximately 7.0 cm in length. This lesion demonstrates heterogeneous T1 and T2 hyperintensity. There is no associated abnormal enhancement. Appearance is consistent with a subacute hematoma. As above, there is subjacent heterotopic ossification and benign periosteal reaction of the distal femur. No enhancing soft tissue mass or focal muscular atrophy identified. The visualized quadriceps and patellar tendons are intact. Soft  tissues As above, subacute hematoma anteromedially in the distal thigh without evidence of associated abnormal enhancement or soft tissue mass. No other fluid collections identified. Femoropopliteal atherosclerosis without evidence of high-grade stenosis or occlusion. IMPRESSION: 1. The mixed attenuation lesion seen anteromedially in the distal thigh on recent CT corresponds with a subacute hematoma involving the vastus medialis muscle. No associated abnormal enhancement or soft tissue mass. 2. Subjacent heterotopic ossification and benign periosteal reaction of the distal femur. 3. No acute osseous findings or aggressive osseous lesion. 4. Femoropopliteal atherosclerosis. Electronically Signed   By: Carey Bullocks M.D.   On: 01/17/2023 11:47    ECHOCARDIOGRAM COMPLETE  Result Date: 01/16/2023    ECHOCARDIOGRAM REPORT   Patient Name:   DAVARI LEDON Date of Exam: 01/16/2023 Medical Rec #:  [id number]      Height:       72.0 in Accession #:    [id number]     Weight:       185.0 lb Date of Birth:  [birth year]     BSA:          2.061 m Patient Age:    87 years       BP:           152/89 mmHg Patient Gender: M              HR:           96 bpm. Exam Location:  [facility] Procedure: 2D Echo, Cardiac Doppler and Color Doppler Indications:     TIA  History:         Patient has prior history of Echocardiogram examinations, most                  recent 09/02/2022. CAD, TIA and Stroke, Arrythmias:Atrial                  Fibrillation, Signs/Symptoms:Dizziness/Lightheadedness, Fatigue                  and Bacteremia; Risk Factors:Hypertension and Dyslipidemia.                  CKD.  Sonographer:     Mikki Harbor Referring Phys:  [id number] Francoise Schaumann [place] Diagnosing Phys: Julien Nordmann MD  Sonographer Comments: Technically difficult study due to poor echo windows. IMPRESSIONS  1. Left ventricular ejection fraction, by estimation, is 40 to 45%. The left ventricle has mildly decreased function. The left ventricle demonstrates global hypokinesis. There is mild left ventricular hypertrophy. Left ventricular diastolic parameters are consistent with Grade I diastolic dysfunction (impaired relaxation).  2. Right ventricular systolic function is normal. The right ventricular size is normal. There is normal pulmonary artery systolic pressure.  3. The mitral valve is normal in structure. Mild to moderate mitral valve regurgitation. No evidence of mitral stenosis.  4. The aortic valve is tricuspid. Aortic valve regurgitation is not visualized. Aortic valve sclerosis is present, with no evidence of aortic valve stenosis.  5. The inferior vena cava is normal in size with greater than 50% respiratory variability, suggesting right atrial pressure of 3 mmHg. FINDINGS  Left Ventricle:  Left ventricular ejection fraction, by estimation, is 40 to 45%. The left ventricle has mildly decreased function. The left ventricle demonstrates global hypokinesis. The left ventricular internal cavity size was normal in size. There is  mild left ventricular hypertrophy. Left ventricular diastolic parameters are consistent with Grade I diastolic dysfunction (impaired relaxation). Right Ventricle: The right ventricular size is normal. No increase  in right ventricular wall thickness. Right ventricular systolic function is normal. There is normal pulmonary artery systolic pressure. The tricuspid regurgitant velocity is 1.94 m/s, and  with an assumed right atrial pressure of 8 mmHg, the estimated right ventricular systolic pressure is 23.1 mmHg. Left Atrium: Left atrial size was normal in size. Right Atrium: Right atrial size was normal in size. Pericardium: There is no evidence of pericardial effusion. Mitral Valve: The mitral valve is normal in structure. There is mild thickening of the mitral valve leaflet(s). Mild mitral annular calcification. Mild to moderate mitral valve regurgitation. No evidence of mitral valve stenosis. MV peak gradient, 5.3 mmHg. The mean mitral valve gradient is 1.0 mmHg. Tricuspid Valve: The tricuspid valve is normal in structure. Tricuspid valve regurgitation is mild . No evidence of tricuspid stenosis. Aortic Valve: The aortic valve is tricuspid. Aortic valve regurgitation is not visualized. Aortic valve sclerosis is present, with no evidence of aortic valve stenosis. Aortic valve mean gradient measures 3.0 mmHg. Aortic valve peak gradient measures 6.0  mmHg. Aortic valve area, by VTI measures 3.26 cm. Pulmonic Valve: The pulmonic valve was normal in structure. Pulmonic valve regurgitation is not visualized. No evidence of pulmonic stenosis. Aorta: The aortic root is normal in size and structure. Venous: The inferior vena cava is normal in size with greater than 50% respiratory  variability, suggesting right atrial pressure of 3 mmHg. IAS/Shunts: No atrial level shunt detected by color flow Doppler.  LEFT VENTRICLE PLAX 2D LVIDd:         4.40 cm     Diastology LVIDs:         2.90 cm     LV e' medial:    5.33 cm/s LV PW:         1.40 cm     LV E/e' medial:  5.0 LV IVS:        1.40 cm     LV e' lateral:   7.18 cm/s LVOT diam:     2.10 cm     LV E/e' lateral: 3.7 LV SV:         78 LV SV Index:   38 LVOT Area:     3.46 cm  LV Volumes (MOD) LV vol d, MOD A2C: 52.1 ml LV vol d, MOD A4C: 92.7 ml LV vol s, MOD A2C: 31.2 ml LV vol s, MOD A4C: 49.7 ml LV SV MOD A2C:     20.9 ml LV SV MOD A4C:     92.7 ml LV SV MOD BP:      34.1 ml RIGHT VENTRICLE RV Basal diam:  3.25 cm RV Mid diam:    2.80 cm RV S prime:     12.20 cm/s LEFT ATRIUM           Index        RIGHT ATRIUM           Index LA diam:      3.60 cm 1.75 cm/m   RA Area:     12.90 cm LA Vol (A2C): 51.1 ml 24.79 ml/m  RA Volume:   21.90 ml  10.63 ml/m LA Vol (A4C): 42.1 ml 20.43 ml/m  AORTIC VALVE                    PULMONIC VALVE AV Area (Vmax):    2.49 cm     PV Vmax:       1.03 m/s AV Area (Vmean):   2.58 cm  PV Peak grad:  4.2 mmHg AV Area (VTI):     3.26 cm AV Vmax:           122.00 cm/s AV Vmean:          82.000 cm/s AV VTI:            0.239 m AV Peak Grad:      6.0 mmHg AV Mean Grad:      3.0 mmHg LVOT Vmax:         87.80 cm/s LVOT Vmean:        61.100 cm/s LVOT VTI:          0.225 m LVOT/AV VTI ratio: 0.94  AORTA Ao Root diam: 3.70 cm Ao Asc diam:  3.40 cm MITRAL VALVE                TRICUSPID VALVE MV Area (PHT): 4.80 cm     TR Peak grad:   15.1 mmHg MV Area VTI:   6.28 cm     TR Vmax:        194.00 cm/s MV Peak grad:  5.3 mmHg MV Mean grad:  1.0 mmHg     SHUNTS MV Vmax:       1.15 m/s     Systemic VTI:  0.22 m MV Vmean:      51.5 cm/s    Systemic Diam: 2.10 cm MV Decel Time: 158 msec MV E velocity: 26.70 cm/s MV A velocity: 109.00 cm/s MV E/A ratio:  0.24 Julien Nordmann MD Electronically signed by Julien Nordmann MD  Signature Date/Time: 01/16/2023/3:50:43 PM    Final         Scheduled Meds:  clopidogrel  75 mg Oral Daily   diltiazem  360 mg Oral Daily   metoprolol succinate  100 mg Oral Daily   pantoprazole  40 mg Oral q morning   potassium chloride  40 mEq Oral Once   QUEtiapine  12.5 mg Oral QHS   rosuvastatin  10 mg Oral Daily   thiamine  100 mg Oral Daily   Continuous Infusions:  cefTRIAXone (ROCEPHIN)  IV 2 g (01/17/23 1413)     LOS: 4 days       Charise Killian, MD [facility] Pager [phone]  If 7PM-7AM, please contact night-coverage [email or web address] 01/18/2023, 8:53 AM

## 2023-01-19 DIAGNOSIS — J189 Pneumonia, unspecified organism: Secondary | ICD-10-CM | POA: Diagnosis not present

## 2023-01-19 DIAGNOSIS — R410 Disorientation, unspecified: Secondary | ICD-10-CM | POA: Diagnosis not present

## 2023-01-19 LAB — CBC
HCT: 29.3 % — ABNORMAL LOW (ref 39.0–52.0)
Hemoglobin: 9.6 g/dL — ABNORMAL LOW (ref 13.0–17.0)
MCH: 31.7 pg (ref 26.0–34.0)
MCHC: 32.8 g/dL (ref 30.0–36.0)
MCV: 96.7 fL (ref 80.0–100.0)
Platelets: 204 10*3/uL (ref 150–400)
RBC: 3.03 MIL/uL — ABNORMAL LOW (ref 4.22–5.81)
RDW: 14.8 % (ref 11.5–15.5)
WBC: 6.3 10*3/uL (ref 4.0–10.5)
nRBC: 0 % (ref 0.0–0.2)

## 2023-01-19 LAB — BASIC METABOLIC PANEL WITH GFR
Anion gap: 8 (ref 5–15)
BUN: 19 mg/dL (ref 8–23)
CO2: 25 mmol/L (ref 22–32)
Calcium: 10.2 mg/dL (ref 8.9–10.3)
Chloride: 104 mmol/L (ref 98–111)
Creatinine, Ser: 0.85 mg/dL (ref 0.61–1.24)
GFR, Estimated: >60 mL/min
Glucose, Bld: 93 mg/dL (ref 70–99)
Potassium: 4 mmol/L (ref 3.5–5.1)
Sodium: 137 mmol/L (ref 135–145)

## 2023-01-19 LAB — HEPARIN LEVEL (UNFRACTIONATED): Heparin Unfractionated: >1.1 [IU]/mL — ABNORMAL HIGH (ref 0.30–0.70)

## 2023-01-19 LAB — APTT
aPTT: 104 s — ABNORMAL HIGH (ref 24–36)
aPTT: 74 s — ABNORMAL HIGH (ref 24–36)

## 2023-01-19 LAB — HEPATITIS PANEL, ACUTE
HCV Ab: NONREACTIVE
Hep A IgM: NONREACTIVE
Hep B C IgM: NONREACTIVE
Hepatitis B Surface Ag: NONREACTIVE

## 2023-01-19 LAB — CRYPTOCOCCAL ANTIGEN: Crypto Ag: NEGATIVE

## 2023-01-19 LAB — SYPHILIS: RPR W/REFLEX TO RPR TITER AND TREPONEMAL ANTIBODIES, TRADITIONAL SCREENING AND DIAGNOSIS ALGORITHM: RPR Ser Ql: NONREACTIVE

## 2023-01-19 MED ORDER — LIDOCAINE 4 % EX CREA: TOPICAL_CREAM | Freq: Two times a day (BID) | CUTANEOUS | Status: DC | PRN

## 2023-01-19 MED ORDER — ENSURE ENLIVE PO LIQD
237.0000 mL | Freq: Two times a day (BID) | ORAL | Status: DC
  Administered 2023-01-20 – 2023-01-23 (×3): 237 mL via ORAL

## 2023-01-19 NOTE — Progress Notes (Signed)
NEUROLOGY CONSULT FOLLOW UP NOTE   Date of service: January 19, 2023 Patient Name: Brett Riley MRN:  [id number] DOB:  10-20-1935  HPI  Brett Riley is a 87 y.o. 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  Unfortunately this was complicated by sepsis with MSSA and L1/2 discitis with abscess (non-surgically managed) as well as a new diagnosis of atrial fibrillation   Then readmitted 6/16 - 7-/24 for shortness of breath worsening over 3 days. On admission there was initial concern for amiodarone toxicity based on his CT chest completed 6/19, although amiodarone had been started less than 1 month ago, which is typically too short of a time course to see significant amiodarone toxicity.  He did get a few days of steroids (40 mg dexamethasone 6/19, then 8 mg for 3 days, 4 mg for 1 day, then methylprednisolone 20 mg on 6/25, 16 mg on 6/26 and 4 mg through 6/29).  CT chest actually looked better on 7/4 (unusual for amiodarone induced toxicity to improve so quickly per curbside discussion with CCM).  On 7/17 his CT chest looked worse again at which time IV steroids were restarted with a long slow taper, which he completed in late September.  At this time his chest CT is markedly improved   Then admitted 8/9-8/13 after presenting for left flank hematoma,  readmitted for pneumonia (afebrile, no leukocytosis, RVP negative but still on steroid taper at that time). Initially received vancomycin and cefepime and de-escalated to  IV ceftriaxone and completed 5 days course, while maintaining doxycycline for MSSA bacteremia/osteomyelitis as well as bactrim for PJP prophylaxis while on high dose steroids    Due to increasing knee pain, had joint aspiration 11/27/2022, with recurrent aspiration 12/26/2022. "Patient's aspirate results showed WBC count of 150 with 62% neutrophils. There were no crystals. Gram stain and culture were negative. Given that the aspirate was bloody, I am recommending a CT scan of the right knee to further evaluate for occult trauma that would explain his current symptoms and findings of the aspiration. If the CT scan is negative, we could consider corticosteroid injection to the knee joint. "  On MRI today he was found to have a subacute hematoma involving the vastus medialis.  His knee pain has been limiting his ambulation) patient with physical therapy.  Despite overall deconditioning, wife does not feel that his strength is significantly changed since his first discharge in June   11/08 was seen for sleep issues starting in SNF after one of his hospitalizations, worsening by nocturia 4-5x nightly, with development of day/night confusion (at baseline would usually only urinate once a night or so).  Wife denies any confusion with his prior hospitalizations, although medicine notes from 5/28 noted that he did have some hallucinations the night prior that was felt to be secondary to hospital-acquired delirium/sundowning and improved over the next several days.  She feels that his confusion gradually worsened over several weeks prior to his admission.  She notes that he was "nearly comatose" and admitting physician was very concerned about a stroke but given  stroke workup has been negative she is appreciative of the neurological consultation to investigate the reason for his change in mentation.  She was aware of his treatment with steroids and confirms that he completed his steroids in late September.  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  Follow-up notes with his PCP do not mention any cognitive concerns interval early November   Interval Hx/subjective   -Per nurse, no reports of agitation overnight  Vitals   Current vital signs: BP 139/74 (BP Location: Right Arm)   Pulse 73   Temp 98.6 F (37 C) (Oral)   Resp 18   Ht 6' (1.829 m)   Wt 83.9 kg   SpO2 95%   BMI 25.09 kg/m  Vital signs in last 24 hours: Temp:  [98 F (36.7 C)-98.9 F (37.2 C)] 98.6 F (37 C) (11/17 0525) Pulse Rate:  [71-94] 73 (11/17 0525) Resp:  [16-20] 18 (11/17 0525) BP: (116-155)/(74-93) 139/74 (11/17 0525) SpO2:  [95 %-98 %] 95 % (11/17 0525)   Body mass index is 25.09 kg/m.  Physical Exam   Constitutional: Appears chronically ill, sleepy Psych: Affect flat Eyes: No scleral injection HENT: No oropharyngeal obstruction.  MSK: no major joint deformities.  Cardiovascular: Perfusing extremities well Respiratory: No grossly audible wheezing GI: Soft.  No distension. There is no tenderness.  Skin: Scattered bruising throughout as well as many very small petechial lesions   Neuro:  Mental Status: Much improved today, awake, oriented to month, year, age, some details of situation. Per wife, slightly confused about some appliances earlier in the day. Following commands more briskly, improved attention / concentration, improved fluency of speech Cranial Nerves: II: Visual Fields are full. Pupils are equal, round, and reactive to light.   III,IV, VI: EOMI with baseline mild right eye ptosis greater than left eye ptosis V: Facial sensation is symmetric to light touch VII: Facial  movement is symmetric.  VIII: hearing is hard of hearing at baseline, improves with hearing aids though he is reluctant to wear them X: Uvula elevates symmetrically XI: Shoulder shrug is symmetric. XII: tongue is midline without atrophy or fasciculations.  Motor: Tone is normal. Bulk is normal.  No drift in the bilateral upper extremities, 5/5 throughout bilateral upper extremities within limits of overall deconditioning.  Right lower extremity is not fully antigravity at the hip (2/5) but can briefly lift the left leg antigravity (3/5 at the hip).  Pain limited at the right knee, at least 3/5 knee flexion/extension.  Left knee at least 4/5 extension and flexion.  Plantar and dorsiflexion 5/5 bilaterally Sensory: Sensation is symmetric to light touch and temperature in the arms and legs. Deep Tendon Reflexes: 3+ and symmetric in the patellae, 2+ and symmetric brachioradialis  Cerebellar: Finger-to-nose intact bilaterally Gait:  Deferred  Labs and Diagnostic Imaging           Component Ref Range & Units 03:36 (01/18/23) 4 mo ago (09/19/22) 4 mo ago (09/12/22) 4 mo ago (08/22/22) 5 mo ago (08/07/22)  Sed Rate 0 - 20 mm/hr 60 High  127 High  CM 106 High  CM 126 High  CM 106 High  Component Ref Range & Units 1 d ago (01/18/23) 3 mo ago (09/22/22) 4 mo ago (09/19/22) 4 mo ago (09/12/22) 4 mo ago (08/26/22) 4 mo ago (08/25/22) 4 mo ago (08/24/22)  CRP <1.0 mg/dL 0.7 7.8 High  CM 57.8 High  CM 3.1 High  CM 3.3 High  CM 5.3 High  CM 7.8 High  CM   Component Ref Range & Units 1 d ago 5 mo ago 5 yr ago  LDH 98 - 192 U/L 113 301 High  CM 137 CM   Aldolase pending  CK 25  Pending Immunological studies: -SPEP & IFE w/ QIG -UPEP/UIFE/Light Chains/TP -[facility] ANA 12+ profile, Myomarker 3 panel -Mayo encephalopathy autoimmune paraneoplastic panel (ENS2) -beta-2 glycoproteins antibodies, cardiolipin antibodies, ANCA  Prior immunological studies:   Latest Reference Range  & Units 08/23/22 10:39  Ribonucleic Protein 0.0 - 0.9 AI 2.2 (H)  ANA positive Anti-Jo 1, centromere antibody screen, DS DNA antibody, GBM antibody, C ANCA, P ANCA, atypical P ANCA, ENA SM, chromatin, anti-MPO , anti-PR-3, ENA, SSA, SSB, Scl-70 previously negative Immunoglobulins notable for elevated IgA at 521 (reference range 61-437)   Infectious studies: Cryptococcal antigen negative HIV 01/17/2023 negative RPR pending TB pending  Basic Metabolic Panel: Recent Labs  Lab 01/14/23 1114 01/16/23 0444 01/17/23 0507 01/18/23 0336 01/19/23 0451  NA 137 137 134* 135 137  K 3.8 3.7 4.0 3.3* 4.0  CL 104 104 102 104 104  CO2 23 25 23 24 25   GLUCOSE 106* 96 93 98 93  BUN 25* 24* 23 24* 19  CREATININE 1.05 0.97 0.87 0.96 0.85  CALCIUM 10.7* 10.1 9.8 9.8 10.2    CBC: Recent Labs  Lab 01/14/23 1114 01/16/23 0444 01/17/23 0507 01/18/23 0336 01/19/23 0451  WBC 8.5 7.2 7.4 7.4 6.3  HGB 11.1* 9.2* 9.7* 9.3* 9.6*  HCT 34.9* 27.6* 28.9* 27.9* 29.3*  MCV 99.1 94.5 94.1 95.2 96.7  PLT 250 215 228 197 204    Coagulation Studies: No results for input(s): "LABPROT", "INR" in the last 72 hours.     Lipid Panel:  Lab Results  Component Value Date   LDLCALC 63 01/15/2023   HgbA1c:  Lab Results  Component Value Date   HGBA1C 5.4 08/18/2022   Urine Drug Screen: No results found for: "LABOPIA", "COCAINSCRNUR", "LABBENZ", "AMPHETMU", "THCU", "LABBARB"  Alcohol Level No results found for: "ETH" INR  Lab Results  Component Value Date   INR 1.3 (H) 01/14/2023   APTT  Lab Results  Component Value Date   APTT 104 (H) 01/19/2023   AED levels: No results found for: "PHENYTOIN", "ZONISAMIDE", "LAMOTRIGINE", "LEVETIRACETA"  CT Chest 01/18/2023: 1. Previously seen dependent bibasilar opacities have improved. Mild residual linear opacities and mild bronchiectasis in these this region likely represents post infectious scarring. No confluent consolidation. 2. Similar contour  abnormality of the ascending aorta, representing either a penetrating ulcer or aneurysm. Recommend follow-up CT angiography when clinically feasible. 3.  Aortic Atherosclerosis (ICD10-I70.0).  CT angio Head and Neck with contrast(Personally reviewed): 1. No intracranial large vessel occlusion. Severe stenosis in the right supraclinoid ICA and mild stenosis in the left supraclinoid ICA. 2. Severe, near occlusive stenosis at the origin of the bilateral vertebral arteries, which has progressed from the prior exam. Additional moderate stenosis in the right V2 segment. 3. 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 4. Possible mild beading in the mid to distal left ICA, which can be seen in the setting of fibromuscular dysplasia. 5. Aortic atherosclerosis.  MRI Brain(01/14/2023) personally reviewed, agree with radiology:   1. No acute intracranial abnormality. 2. Age-related cerebral atrophy with moderate to advanced chronic microvascular ischemic disease.  MRI HEAD 01/18/2023 personally reviewed, agree with radiology:   1. Motion degraded exam [This especially limits diagnostic sensitivity of post-contrast images] 2. Single punctate acute ischemic nonhemorrhagic cortical infarct involving the high posterior left frontal cortex near the vertex. [Potentially artifact] 3. Otherwise stable brain MRI. Underlying age-related cerebral atrophy with moderate to advanced chronic microvascular ischemic disease.   MRA HEAD personally reviewed, agree with radiology:    1. Motion degraded exam. 2. Negative intracranial MRA for large vessel occlusion or other emergent finding. No hemodynamically significant or correctable stenosis. No evidence for MRA evidence for vasculitis.  MRI Lumbar spine 01/18/2023 1. Interval evolution of previously seen changes related to osteomyelitis discitis at L1-2 with progressive vertebral body height loss, intervertebral disc space narrowing, and  endplate irregularity. Residual endplate enhancement at this level. Overall, appearance is improved from prior exam. No epidural collections. 2. Progressive disc bulging with endplate spurring at L1-2 secondary to #1 above, with worsened moderate spinal stenosis at this level. 3. Additional multilevel lumbar spondylosis with resultant mild lateral recess narrowing at L2-3, L4-5, and L5-S1 as above. Mild to moderate bilateral L1, L4, and L5 foraminal stenosis.  ECHO 11/14  1. Left ventricular ejection fraction, by estimation, is 40 to 45%. The  left ventricle has mildly decreased function. The left ventricle  demonstrates global hypokinesis. There is mild left ventricular  hypertrophy. Left ventricular diastolic parameters  are consistent with Grade I diastolic dysfunction (impaired relaxation).  [Note this compares to  EF of 50 to 55%  2. Right ventricular systolic function is normal. The right ventricular  size is normal. There is normal pulmonary artery systolic pressure.   3. The mitral valve is normal in structure. Mild to moderate mitral valve  regurgitation. No evidence of mitral stenosis.   4. The aortic valve is tricuspid. Aortic valve regurgitation is not  visualized. Aortic valve sclerosis is present, with no evidence of aortic  valve stenosis.   5. The inferior vena cava is normal in size with greater than 50%  respiratory variability, suggesting right atrial pressure of 3 mmHg.    Impression   ROEMELLO LANGBEHN is a 87 y.o. man with multiple recurrent hospitalizations since mid May.  Of note he initially presented in May with a penetrating aortic ulcer and highly elevated ESR and CRP, as well as and apparently steroid responsive pulmonary process, possibly amiodarone toxicity related although the rapidity with which it developed and the time course of response to steroids  is concerning for alternative diagnosis of possible underlying autoimmune condition.  In this clinical  setting of vasculitis has been considered; while his initial antibody testing was largely negative, RNP was positive and I do think repeating antibody testing may be useful as initial testing can be negative early in the disease course. Certainly his initial ESR/CRP may have been elevated in the setting of MSSA bacteremia, discitis etc.   ESR has markedly improved and CRP has normalized since prior admissions.  LDH which was previously elevated has also normalized.  CK remains stable.  HIV and cryptococcal antigen are negative but much of his workup which I initiated here is still pending  I am not sure that his mental status is attributable to pneumonia given no leukocytosis or fever and overall improvement in his chest CT.  Certainly delirium is still on the differential but  given the gradual worsening of his mental status after stopping steroids must consider an autoimmune process.  Unfortunately, his repeat MRI brain and MRA head are both significantly motion limited, which significantly reduces sensitivity for picking up a vasculitic process.  The small punctate lesion described could be artifactual versus a small stroke; could be seen in the small vessel vasculitis.  Fortunately his lumbar spinal processes appears to be improving overall however I do think bedside lumbar puncture would be very challenging due to his history and IR LP would be safer.  However, given markedly improved mental status after two nights of Seroquel, unclear if lumbar puncture and EEG are truly needed; improvement with sleep regulation is more consistent with delirium  Given that the strongest evidence of underlying autoimmune process have been his CT chest findings I appreciate pulmonology's recommendations for potential further immunosuppression and the urgency for which that should be pursued  Unfortunately we do not have rheumatology available at [facility] -- strongly recommend rheumatology referral on an outpatient  basis  Recommendations  -Okay to continue Seroquel 12.5 mg nightly for now, with additional 12.5 as needed -Continue Heparin drip for now pending consideration of lumbar puncture which could be performed the soonest on 11/18 given last dose of Eliquis 11/15 evening; small punctate stroke is not large enough to be a contraindication to continuing heparin -Depending on examination on 11/18, consider EEG and LP vs. Outpatient neurology follow-up for neurocognitive testing -Depending on clinical course, consider reinitiating immunosuppression versus close outpatient follow-up with pulmonary and rheumatology -Would greatly appreciate at least curbside discussion with Dr. Karna Christmas tomorrow ______________________________________________________________________   Nunzio Cory MD-PhD [facility] [phone] Coverage for [facility] is from 8 AM to 4 AM in-house and 4 PM to 8 PM by telephone/video. 8 PM to 8 AM emergent questions or overnight urgent questions should be addressed to Teleneurology On-call or [facility] neurohospitalist; contact information can be found on AMION

## 2023-01-19 NOTE — Progress Notes (Signed)
This RN assumed pt's care at approximately 13:45. Full assessment completed by this RN.

## 2023-01-19 NOTE — Progress Notes (Signed)
PHARMACY - ANTICOAGULATION CONSULT NOTE  Pharmacy Consult for heparin infusion Indication: atrial fibrillation  No Known Allergies  Patient Measurements: Height: 6' (182.9 cm) Weight: 83.9 kg (184 lb 15.5 oz) IBW/kg (Calculated) : 77.6 Heparin Dosing Weight: 83.9 kg  Vital Signs: Temp: 98.6 F (37 C) (11/17 0525) Temp Source: Oral (11/17 0525) BP: 139/74 (11/17 0525) Pulse Rate: 73 (11/17 0525)  Labs: Recent Labs    01/17/23 0507 01/18/23 0336 01/18/23 1548 01/18/23 1937 01/19/23 0451  HGB 9.7* 9.3*  --   --  9.6*  HCT 28.9* 27.9*  --   --  29.3*  PLT 228 197  --   --  204  APTT  --   --   --  49* 104*  HEPARINUNFRC  --   --   --   --  >1.10*  CREATININE 0.87 0.96  --   --  0.85  CKTOTAL  --   --  25*  --   --     Estimated Creatinine Clearance: 69.7 mL/min (by C-G formula based on SCr of 0.85 mg/dL).   Medical History: Past Medical History:  Diagnosis Date   Cancer (HCC)    skin cancer basal   Carotid arterial disease (HCC)    a. 02/2016 L CEA; b. 01/2017 U/S: patent LICA, 1-39% RICA.   Celiac artery stenosis (HCC)    Coronary artery disease    a. 01/2016 MV: mild apical/basal inferior, apical lateral, mid anterolateral, and mid inferolateral ischemia. EF 57%; b. 02/2016 Cath: LM 40ost, LAD 70p/m, 20d, D1 95 (small), LCX nl, RCA 84m, RPDA 90 (small), EF 55-65%-->med Rx. Rec CABG for recurrent symptoms.   Hearing loss    History of echocardiogram    a. 02/2016 Echo: EF 50-55%, no rwma, mild MR.   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 Pharmacy consulted for Heparin  dosing. Patient was taking Apixaban 2.5mg  bid prior to admission for afib, last dose was on 11/15 2038  Date Time HL/aPTT Rate/Comment  11/16 1937 aPTT 49 850 u/hr; Subtherapeutic 11/17 0451 >1.1 / 104 aPTT supratherapeutic / not correlating  Goal of Therapy:  Heparin level 0.3-0.7 units/ml aPTT 66-102 seconds Monitor platelets by anticoagulation protocol: Yes   Plan:  Decrease heparin infusion to 1050 units/hour Recheck aPTT level in 8 hours after rate change Use aPTT to adjust dose until correlation with HL -Will check daily CBC while on Heparin infusion   Thank you for involving pharmacy in this patient's care.   Otelia Sergeant, PharmD, [facility] 01/19/2023 5:48 AM

## 2023-01-19 NOTE — Progress Notes (Signed)
PHARMACY - ANTICOAGULATION CONSULT NOTE  Pharmacy Consult for heparin infusion Indication: atrial fibrillation  No Known Allergies  Patient Measurements: Height: 6' (182.9 cm) Weight: 83.9 kg (184 lb 15.5 oz) IBW/kg (Calculated) : 77.6 Heparin Dosing Weight: 83.9 kg  Vital Signs: Temp: 98.8 F (37.1 C) (11/17 0946) Temp Source: Oral (11/17 0525) BP: 146/78 (11/17 0946) Pulse Rate: 83 (11/17 0946)  Labs: Recent Labs    01/17/23 0507 01/18/23 0336 01/18/23 1548 01/18/23 1937 01/19/23 0451 01/19/23 1348  HGB 9.7* 9.3*  --   --  9.6*  --   HCT 28.9* 27.9*  --   --  29.3*  --   PLT 228 197  --   --  204  --   APTT  --   --   --  49* 104* 74*  HEPARINUNFRC  --   --   --   --  >1.10*  --   CREATININE 0.87 0.96  --   --  0.85  --   CKTOTAL  --   --  25*  --   --   --     Estimated Creatinine Clearance: 69.7 mL/min (by C-G formula based on SCr of 0.85 mg/dL).   Medical History: Past Medical History:  Diagnosis Date   Cancer (HCC)    skin cancer basal   Carotid arterial disease (HCC)    a. 02/2016 L CEA; b. 01/2017 U/S: patent LICA, 1-39% RICA.   Celiac artery stenosis (HCC)    Coronary artery disease    a. 01/2016 MV: mild apical/basal inferior, apical lateral, mid anterolateral, and mid inferolateral ischemia. EF 57%; b. 02/2016 Cath: LM 40ost, LAD 70p/m, 20d, D1 95 (small), LCX nl, RCA 24m, RPDA 90 (small), EF 55-65%-->med Rx. Rec CABG for recurrent symptoms.   Hearing loss    History of echocardiogram    a. 02/2016 Echo: EF 50-55%, no rwma, mild MR.   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 Pharmacy consulted for Heparin dosing. Patient was taking Apixaban 2.5mg  bid prior to admission for afib, last dose was on 11/15 2038  Date Time HL/aPTT Rate/Comment  11/16 1937 aPTT 49 850 u/hr; Subtherapeutic 11/17 0451 >1.1 / 104 aPTT supratherapeutic / not correlating 11/17 1348 aPTT 74 Therapeutic x1   Goal of Therapy:  Heparin level 0.3-0.7 units/ml aPTT 66-102 seconds Monitor platelets by anticoagulation protocol: Yes   Plan:  Continue heparin infusion at 1050 units/hour check confirmatory aPTT level in 8 hours  Use aPTT to adjust dose until correlation with HL -Will check daily CBC while on Heparin infusion   Thank you for involving pharmacy in this patient's care.   Bari Mantis PharmD Clinical Pharmacist 01/19/2023

## 2023-01-19 NOTE — Progress Notes (Addendum)
PROGRESS NOTE    Brett Riley  QMV:[id number] DOB: [birth year] DOA: 01/14/2023 PCP: Dale Boykin, MD   Assessment & Plan:   Principal Problem:   CVA (cerebral vascular accident) [facility]) Active Problems:   Vertebral osteomyelitis continue doxycycline until 10/24/2022 [facility])   Essential hypertension, benign   Dyslipidemia   Persistent atrial fibrillation (HCC)   Heart failure with preserved ejection fraction (HCC)   History of CAD (coronary artery disease)   CKD (chronic kidney disease), stage III (HCC)  Assessment and Plan: B/l pneumonia: as per CXR. Completed abx course. Bronchodilators prn. Encourage incentive spirometry   Somnolence & encephalopathy: mental status waxes and wanes. No previous dx of dementia. TSH is elevated, FT4 is WNL. B12 is elevated. FT3 is pending. B1 is pending. Continue on low dose of seroquel. Will likely have LP tomorrow as per neuro. Neuro following and recs apprec   CVA: r/o as per MRI.   Possible fibromuscular dysplasia: as per CTA head & neck. No inpatient work-up needed as per vasc surg and pt can f/u w/ previously scheduled outpatient appointment   Vertebral osteomyelitis: completed abx course. Now with chronic lower extremity weakness. PT/OT recs home health   Chronic right knee pain: CT right knee shows Ovoid, mass-like mixed attenuation area abutting the anteromedial cortex of the distal femoral diaphysis, possible hematoma vs neoplasm, and recommends MRI. MRI right femur shows subacute hematoma. Continue w/ supportive care     CKDIIIa: Cr is better than baseline    Hx of CAD: no CP. Continue on statin, metoprolol, plavix    Chronic diastolic CHF: appears euvolemic. Continue on metoprolol, statin. Monitor I/Os   Persistent a. fib: continue on diltiazem, metoprolol. Holding home eliquis & continue on IV heparin for likely LP on 01/20/23 as per neuro    HLD: continue on statin   HTN: continue on BB, CCB      DVT prophylaxis: IV  heparin Code Status: DNR Family Communication: discussed pt's care w/ pt's family at bedside and answered their questions  Disposition Plan: likely d/c back home w/ HH   Level of care: Telemetry Medical Status is: Inpatient Remains inpatient appropriate because: severity of illness    Consultants:    Procedures:   Antimicrobials:   Subjective: Pt is lethargic   Objective: Vitals:   01/18/23 1100 01/18/23 1607 01/18/23 2010 01/19/23 0525  BP: (!) 155/93 124/86 116/79 139/74  Pulse: 94 84 71 73  Resp: 17 16 20 18   Temp: 98 F (36.7 C) 98.8 F (37.1 C) 98.9 F (37.2 C) 98.6 F (37 C)  TempSrc:   Oral Oral  SpO2: 98% 95% 96% 95%  Weight:      Height:        Intake/Output Summary (Last 24 hours) at 01/19/2023 0811 Last data filed at 01/18/2023 1921 Gross per 24 hour  Intake 240 ml  Output --  Net 240 ml   Filed Weights   01/14/23 1112  Weight: 83.9 kg    Examination:  General exam: appears lethargic  Respiratory system: decreased breath sounds  Cardiovascular system: S1/S2+. No rubs or clicks  Gastrointestinal system: abd is soft, NT, ND & normal bowel sounds   Central nervous system: lethargic  Psychiatry: judgement and insight is not at baseline. Flat mood and affect    Data Reviewed: I have personally reviewed following labs and imaging studies  CBC: Recent Labs  Lab 01/14/23 1114 01/16/23 0444 01/17/23 0507 01/18/23 0336 01/19/23 0451  WBC 8.5 7.2 7.4 7.4 6.3  HGB 11.1* 9.2* 9.7* 9.3* 9.6*  HCT 34.9* 27.6* 28.9* 27.9* 29.3*  MCV 99.1 94.5 94.1 95.2 96.7  PLT 250 215 228 197 204   Basic Metabolic Panel: Recent Labs  Lab 01/14/23 1114 01/16/23 0444 01/17/23 0507 01/18/23 0336 01/19/23 0451  NA 137 137 134* 135 137  K 3.8 3.7 4.0 3.3* 4.0  CL 104 104 102 104 104  CO2 23 25 23 24 25   GLUCOSE 106* 96 93 98 93  BUN 25* 24* 23 24* 19  CREATININE 1.05 0.97 0.87 0.96 0.85  CALCIUM 10.7* 10.1 9.8 9.8 10.2   GFR: Estimated Creatinine  Clearance: 69.7 mL/min (by C-G formula based on SCr of 0.85 mg/dL). Liver Function Tests: Recent Labs  Lab 01/14/23 1114  AST 17  ALT 12  ALKPHOS 76  BILITOT 0.4  PROT 7.0  ALBUMIN 4.0   No results for input(s): "LIPASE", "AMYLASE" in the last 168 hours. No results for input(s): "AMMONIA" in the last 168 hours. Coagulation Profile: Recent Labs  Lab 01/14/23 1114  INR 1.3*   Cardiac Enzymes: Recent Labs  Lab 01/18/23 1548  CKTOTAL 25*   BNP (last 3 results) No results for input(s): "PROBNP" in the last 8760 hours. HbA1C: No results for input(s): "HGBA1C" in the last 72 hours. CBG: Recent Labs  Lab 01/18/23 1607  GLUCAP 107*   Lipid Profile: No results for input(s): "CHOL", "HDL", "LDLCALC", "TRIG", "CHOLHDL", "LDLDIRECT" in the last 72 hours.  Thyroid Function Tests: Recent Labs    01/17/23 0507 01/18/23 0336  TSH 5.360*  --   FREET4  --  1.07   Anemia Panel: Recent Labs    01/18/23 0336  VITAMINB12 1,067*   Sepsis Labs: No results for input(s): "PROCALCITON", "LATICACIDVEN" in the last 168 hours.  No results found for this or any previous visit (from the past 240 hour(s)).       Radiology Studies: MR Lumbar Spine W Wo Contrast  Result Date: 01/19/2023 CLINICAL DATA:  Follow-up examination for osteomyelitis discitis, low back pain. EXAM: MRI LUMBAR SPINE WITHOUT AND WITH CONTRAST TECHNIQUE: Multiplanar and multiecho pulse sequences of the lumbar spine were obtained without and with intravenous contrast. CONTRAST:  8mL GADAVIST GADOBUTROL 1 MMOL/ML IV SOLN COMPARISON:  Previous MRI from 08/02/2022. FINDINGS: Segmentation: Standard. Lowest well-formed disc space labeled the L5-S1 level. Alignment: Trace degenerative retrolisthesis of L2 on L3. Underlying trace dextroscoliosis. Alignment otherwise normal with preservation of the normal lumbar lordosis. Vertebrae: There has been interval evolution of previously seen changes related osteomyelitis discitis  at L1-2. Mildly progressive vertebral body height loss at the L1 and L2 vertebral bodies. Progressive intervertebral disc space narrowing with endplate irregularity about the L1-2 interspace. Previously seen marrow edema and enhancement within the vertebral bodies themselves has largely resolved. Residual endplate enhancement now seen on today's exam. No epidural phlegmon or other collection. Progressive disc bulge and endplate spurring posteriorly with progressive moderate spinal stenosis at this level. No other evidence for new infection elsewhere within the lumbar spine. Reactive endplate changes about the L4-5 and L5-S1 interspaces felt to be degenerative in nature, and are similar to prior. Underlying bone marrow signal intensity diffusely heterogeneous, similar to prior. No new worrisome osseous lesions. Conus medullaris and cauda equina: Conus extends to the L1 level. Conus and cauda equina appear normal. No abnormal enhancement. Paraspinal and other soft tissues: Diffuse edema seen within the lower paraspinous soft tissues, suspected be related to overall volume status. No loculated collections. Aortic atherosclerosis. Retroaortic left renal vein  noted. Disc levels: T12-L1: Mild disc bulge with right-sided endplate spurring. Mild facet hypertrophy. No spinal stenosis. Foramina remain patent. L1-2: Changes related to prior osteomyelitis discitis with intervertebral disc space narrowing, endplate irregularity, and progressive disc bulging with endplate spurring. Mild facet and ligament flavum hypertrophy. Progressive moderate spinal stenosis at this level. Mild-to-moderate bilateral L1 foraminal stenosis. L2-3: Degenerative intervertebral disc space narrowing with diffuse disc bulge, asymmetric to the right. Reactive endplate spurring. Mild facet and ligament flavum hypertrophy. Resultant mild canal with bilateral subarticular stenosis. Central canal remains adequately patent. Mild left L2 foraminal narrowing.  Right neural foramen remains patent. L3-4: Degenerative intervertebral disc space narrowing with disc desiccation and diffuse disc bulge. Reactive endplate spurring. Mild facet and ligament flavum hypertrophy. No significant spinal stenosis. Mild bilateral L3 foraminal narrowing. L4-5: Degenerative disc space narrowing with diffuse disc bulge and reactive endplate spurring. Superimposed small central disc protrusion mildly indents the ventral thecal sac. Mild facet and ligament flavum hypertrophy. Resultant mild right worse than left bilateral subarticular stenosis. Central canal remains patent. Mild to moderate bilateral L4 foraminal narrowing. L5-S1: Degenerative vertebral disc space narrowing reactive endplate spurring. Broad-based central disc protrusion mildly indents the ventral thecal sac. Mild right greater than left facet hypertrophy. Resultant mild narrowing of the right lateral recess. Central canal remains patent. Mild to moderate bilateral L5 foraminal narrowing. IMPRESSION: 1. Interval evolution of previously seen changes related to osteomyelitis discitis at L1-2 with progressive vertebral body height loss, intervertebral disc space narrowing, and endplate irregularity. Residual endplate enhancement at this level. Overall, appearance is improved from prior exam. No epidural collections. 2. Progressive disc bulging with endplate spurring at L1-2 secondary to #1 above, with worsened moderate spinal stenosis at this level. 3. Additional multilevel lumbar spondylosis with resultant mild lateral recess narrowing at L2-3, L4-5, and L5-S1 as above. Mild to moderate bilateral L1, L4, and L5 foraminal stenosis. Aortic Atherosclerosis (ICD10-I70.0). Electronically Signed   By: Rise Mu M.D.   On: 01/19/2023 05:55   MR BRAIN W WO CONTRAST  Result Date: 01/19/2023 CLINICAL DATA:  Initial evaluation for neuro deficit, stroke suspected. Vasculitis suspected. EXAM: MRI HEAD WITHOUT AND WITH CONTRAST  MRA HEAD WITHOUT CONTRAST TECHNIQUE: Multiplanar, multi-echo pulse sequences of the brain and surrounding structures were acquired without and with intravenous contrast. Angiographic images of the Circle of Willis were acquired using MRA technique without intravenous contrast. CONTRAST:  8mL GADAVIST GADOBUTROL 1 MMOL/ML IV SOLN COMPARISON:  Comparison made with prior MRI from 01/14/2023. FINDINGS: MRI HEAD FINDINGS Brain: Examination moderately degraded by motion artifact, limiting assessment. Age-related cerebral atrophy. Patchy and confluent T2/FLAIR hyperintensity involving the periventricular and deep white matter both cerebral hemispheres, consistent with chronic small vessel ischemic disease, moderate to advanced in nature. Single punctate focus of restricted diffusion seen involving the high posterior left frontal cortex near the vertex (series 8, image 46), consistent with a tiny acute ischemic infarct. No visible associated hemorrhage. No other acute or subacute ischemia. Gray-white matter differentiation otherwise maintained. No acute intracranial hemorrhage. Single punctate chronic microhemorrhage noted at the left parietal region (series 12, image 45), of doubtful significance in isolation. No mass lesion, midline shift or mass effect. Mild ventricular prominence related global parenchymal volume loss without hydrocephalus. No extra-axial fluid collection. Partially empty sella noted. No appreciable abnormal enhancement, although postcontrast sequences are markedly degraded by motion. Vascular: Major intracranial vascular flow voids are maintained. Skull and upper cervical spine: Craniocervical junction within normal limits. Bone marrow signal intensity normal. Small lipoma noted at  the right forehead. No other scalp soft tissue abnormality. Sinuses/Orbits: Globes and orbital soft tissues within normal limits. Small volume layering secretions noted within the right sphenoid sinus. Paranasal sinuses are  otherwise largely clear. Trace bilateral mastoid effusions, of doubtful significance. Visualized nasopharynx unremarkable. Other: None. MRA HEAD FINDINGS Anterior circulation: Examination degraded by motion artifact. Both internal carotid arteries are patent through the siphons without hemodynamically significant stenosis or other abnormality. A1 segments patent bilaterally. Left A1 hypoplastic. Normal anterior communicating artery complex. Both ACAs patent without stenosis. No M1 stenosis or occlusion. Distal MCA branches perfused and symmetric. No beading or irregularity. Posterior circulation: Both vertebral arteries widely patent without stenosis. Left PICA patent. Right PICA not well seen. Basilar patent without stenosis. Superior cerebral arteries patent bilaterally. Both PCAs primarily supplied via the basilar. Both PCAs widely patent to their distal aspects without stenosis. No beading or irregularity. Anatomic variants: Hypoplastic left A1 segment. No intracranial aneurysm or other vascular malformation. IMPRESSION: MRI HEAD: 1. Motion degraded exam. 2. Single punctate acute ischemic nonhemorrhagic cortical infarct involving the high posterior left frontal cortex near the vertex. 3. Otherwise stable brain MRI. Underlying age-related cerebral atrophy with moderate to advanced chronic microvascular ischemic disease. MRA HEAD: 1. Motion degraded exam. 2. Negative intracranial MRA for large vessel occlusion or other emergent finding. No hemodynamically significant or correctable stenosis. No evidence for MRA evidence for vasculitis. Electronically Signed   By: Rise Mu M.D.   On: 01/19/2023 05:22   MR ANGIO HEAD WO CONTRAST  Result Date: 01/19/2023 CLINICAL DATA:  Initial evaluation for neuro deficit, stroke suspected. Vasculitis suspected. EXAM: MRI HEAD WITHOUT AND WITH CONTRAST MRA HEAD WITHOUT CONTRAST TECHNIQUE: Multiplanar, multi-echo pulse sequences of the brain and surrounding  structures were acquired without and with intravenous contrast. Angiographic images of the Circle of Willis were acquired using MRA technique without intravenous contrast. CONTRAST:  8mL GADAVIST GADOBUTROL 1 MMOL/ML IV SOLN COMPARISON:  Comparison made with prior MRI from 01/14/2023. FINDINGS: MRI HEAD FINDINGS Brain: Examination moderately degraded by motion artifact, limiting assessment. Age-related cerebral atrophy. Patchy and confluent T2/FLAIR hyperintensity involving the periventricular and deep white matter both cerebral hemispheres, consistent with chronic small vessel ischemic disease, moderate to advanced in nature. Single punctate focus of restricted diffusion seen involving the high posterior left frontal cortex near the vertex (series 8, image 46), consistent with a tiny acute ischemic infarct. No visible associated hemorrhage. No other acute or subacute ischemia. Gray-white matter differentiation otherwise maintained. No acute intracranial hemorrhage. Single punctate chronic microhemorrhage noted at the left parietal region (series 12, image 45), of doubtful significance in isolation. No mass lesion, midline shift or mass effect. Mild ventricular prominence related global parenchymal volume loss without hydrocephalus. No extra-axial fluid collection. Partially empty sella noted. No appreciable abnormal enhancement, although postcontrast sequences are markedly degraded by motion. Vascular: Major intracranial vascular flow voids are maintained. Skull and upper cervical spine: Craniocervical junction within normal limits. Bone marrow signal intensity normal. Small lipoma noted at the right forehead. No other scalp soft tissue abnormality. Sinuses/Orbits: Globes and orbital soft tissues within normal limits. Small volume layering secretions noted within the right sphenoid sinus. Paranasal sinuses are otherwise largely clear. Trace bilateral mastoid effusions, of doubtful significance. Visualized  nasopharynx unremarkable. Other: None. MRA HEAD FINDINGS Anterior circulation: Examination degraded by motion artifact. Both internal carotid arteries are patent through the siphons without hemodynamically significant stenosis or other abnormality. A1 segments patent bilaterally. Left A1 hypoplastic. Normal anterior communicating artery complex. Both ACAs patent  without stenosis. No M1 stenosis or occlusion. Distal MCA branches perfused and symmetric. No beading or irregularity. Posterior circulation: Both vertebral arteries widely patent without stenosis. Left PICA patent. Right PICA not well seen. Basilar patent without stenosis. Superior cerebral arteries patent bilaterally. Both PCAs primarily supplied via the basilar. Both PCAs widely patent to their distal aspects without stenosis. No beading or irregularity. Anatomic variants: Hypoplastic left A1 segment. No intracranial aneurysm or other vascular malformation. IMPRESSION: MRI HEAD: 1. Motion degraded exam. 2. Single punctate acute ischemic nonhemorrhagic cortical infarct involving the high posterior left frontal cortex near the vertex. 3. Otherwise stable brain MRI. Underlying age-related cerebral atrophy with moderate to advanced chronic microvascular ischemic disease. MRA HEAD: 1. Motion degraded exam. 2. Negative intracranial MRA for large vessel occlusion or other emergent finding. No hemodynamically significant or correctable stenosis. No evidence for MRA evidence for vasculitis. Electronically Signed   By: Rise Mu M.D.   On: 01/19/2023 05:22   CT CHEST WO CONTRAST  Result Date: 01/18/2023 CLINICAL DATA:  Diffuse/interstitial lung disease EXAM: CT CHEST WITHOUT CONTRAST TECHNIQUE: Multidetector CT imaging of the chest was performed following the standard protocol without IV contrast. RADIATION DOSE REDUCTION: This exam was performed according to the departmental dose-optimization program which includes automated exposure control,  adjustment of the mA and/or kV according to patient size and/or use of iterative reconstruction technique. COMPARISON:  None Available. FINDINGS: Cardiovascular: Similar contour abnormality of the ascending thoracic aorta just proximal to the arch. Cardiomegaly. No sizable pericardial effusion. Coronary artery and aortic atherosclerosis. Mediastinum/Nodes: No enlarged mediastinal or axillary lymph nodes. Thyroid gland, trachea, and esophagus demonstrate no significant findings. Lungs/Pleura: Previously seen dependent bibasilar opacities have improved. Mild residual linear opacities and mild bronchiectasis in these this region. No confluent consolidation. No pneumothorax. No pleural effusions. Upper Abdomen: No acute abnormality. Musculoskeletal: No acute fracture. IMPRESSION: 1. Previously seen dependent bibasilar opacities have improved. Mild residual linear opacities and mild bronchiectasis in these this region likely represents post infectious scarring. No confluent consolidation. 2. Similar contour abnormality of the ascending aorta, representing either a penetrating ulcer or aneurysm. Recommend follow-up CT angiography when clinically feasible. 3.  Aortic Atherosclerosis (ICD10-I70.0). Electronically Signed   By: Feliberto Harts M.D.   On: 01/18/2023 12:21        Scheduled Meds:  clopidogrel  75 mg Oral Daily   diltiazem  360 mg Oral Daily   metoprolol succinate  100 mg Oral Daily   pantoprazole  40 mg Oral q morning   QUEtiapine  12.5 mg Oral QHS   rosuvastatin  10 mg Oral Daily   thiamine  100 mg Oral Daily   Continuous Infusions:  cefTRIAXone (ROCEPHIN)  IV 2 g (01/18/23 1417)   heparin 1,050 Units/hr (01/19/23 0651)     LOS: 5 days       Charise Killian, MD [facility] Pager [phone]  If 7PM-7AM, please contact night-coverage [email or web address] 01/19/2023, 8:11 AM

## 2023-01-19 NOTE — Plan of Care (Signed)
  Problem: Ischemic Stroke/TIA Tissue Perfusion: Goal: Complications of ischemic stroke/TIA will be minimized Outcome: Progressing   Problem: Coping: Goal: Will verbalize positive feelings about self Outcome: Progressing Goal: Will identify appropriate support needs Outcome: Progressing   Problem: Nutrition: Goal: Dietary intake will improve Outcome: Progressing

## 2023-01-20 ENCOUNTER — Telehealth (INDEPENDENT_AMBULATORY_CARE_PROVIDER_SITE_OTHER): Payer: Self-pay | Admitting: Vascular Surgery

## 2023-01-20 DIAGNOSIS — M4620 Osteomyelitis of vertebra, site unspecified: Secondary | ICD-10-CM | POA: Diagnosis not present

## 2023-01-20 DIAGNOSIS — M25461 Effusion, right knee: Secondary | ICD-10-CM | POA: Diagnosis not present

## 2023-01-20 DIAGNOSIS — I2584 Coronary atherosclerosis due to calcified coronary lesion: Secondary | ICD-10-CM | POA: Diagnosis not present

## 2023-01-20 DIAGNOSIS — N183 Chronic kidney disease, stage 3 unspecified: Secondary | ICD-10-CM | POA: Diagnosis not present

## 2023-01-20 DIAGNOSIS — L89312 Pressure ulcer of right buttock, stage 2: Secondary | ICD-10-CM | POA: Diagnosis not present

## 2023-01-20 DIAGNOSIS — J9601 Acute respiratory failure with hypoxia: Secondary | ICD-10-CM | POA: Diagnosis not present

## 2023-01-20 DIAGNOSIS — I130 Hypertensive heart and chronic kidney disease with heart failure and stage 1 through stage 4 chronic kidney disease, or unspecified chronic kidney disease: Secondary | ICD-10-CM | POA: Diagnosis not present

## 2023-01-20 DIAGNOSIS — G9340 Encephalopathy, unspecified: Secondary | ICD-10-CM | POA: Diagnosis not present

## 2023-01-20 DIAGNOSIS — G9341 Metabolic encephalopathy: Secondary | ICD-10-CM | POA: Diagnosis not present

## 2023-01-20 DIAGNOSIS — D631 Anemia in chronic kidney disease: Secondary | ICD-10-CM | POA: Diagnosis not present

## 2023-01-20 DIAGNOSIS — N179 Acute kidney failure, unspecified: Secondary | ICD-10-CM | POA: Diagnosis not present

## 2023-01-20 DIAGNOSIS — I4819 Other persistent atrial fibrillation: Secondary | ICD-10-CM | POA: Diagnosis not present

## 2023-01-20 DIAGNOSIS — I5030 Unspecified diastolic (congestive) heart failure: Secondary | ICD-10-CM | POA: Diagnosis not present

## 2023-01-20 LAB — CBC
HCT: 29.2 % — ABNORMAL LOW (ref 39.0–52.0)
Hemoglobin: 9.7 g/dL — ABNORMAL LOW (ref 13.0–17.0)
MCH: 31.3 pg (ref 26.0–34.0)
MCHC: 33.2 g/dL (ref 30.0–36.0)
MCV: 94.2 fL (ref 80.0–100.0)
Platelets: 235 10*3/uL (ref 150–400)
RBC: 3.1 MIL/uL — ABNORMAL LOW (ref 4.22–5.81)
RDW: 14.7 % (ref 11.5–15.5)
WBC: 6.5 10*3/uL (ref 4.0–10.5)
nRBC: 0 % (ref 0.0–0.2)

## 2023-01-20 LAB — BASIC METABOLIC PANEL WITH GFR
Anion gap: 9 (ref 5–15)
BUN: 18 mg/dL (ref 8–23)
CO2: 25 mmol/L (ref 22–32)
Calcium: 9.9 mg/dL (ref 8.9–10.3)
Chloride: 103 mmol/L (ref 98–111)
Creatinine, Ser: 0.86 mg/dL (ref 0.61–1.24)
GFR, Estimated: >60 mL/min
Glucose, Bld: 91 mg/dL (ref 70–99)
Potassium: 3.4 mmol/L — ABNORMAL LOW (ref 3.5–5.1)
Sodium: 137 mmol/L (ref 135–145)

## 2023-01-20 LAB — T3, FREE: T3, Free: 2.4 pg/mL (ref 2.0–4.4)

## 2023-01-20 LAB — APTT
aPTT: 78 s — ABNORMAL HIGH (ref 24–36)
aPTT: 92 s — ABNORMAL HIGH (ref 24–36)

## 2023-01-20 LAB — ANA W/REFLEX IF POSITIVE: Anti Nuclear Antibody (ANA): NEGATIVE

## 2023-01-20 LAB — HEPARIN LEVEL (UNFRACTIONATED): Heparin Unfractionated: >1.1 [IU]/mL — ABNORMAL HIGH (ref 0.30–0.70)

## 2023-01-20 MED ORDER — ORAL CARE MOUTH RINSE: 15.0000 mL | OROMUCOSAL | Status: DC | PRN

## 2023-01-20 MED ORDER — POTASSIUM CHLORIDE 20 MEQ PO PACK
20.0000 meq | PACK | Freq: Once | ORAL | Status: AC
  Administered 2023-01-20: 20 meq via ORAL
  Filled 2023-01-20: qty 1

## 2023-01-20 NOTE — Progress Notes (Signed)
PHARMACY - ANTICOAGULATION CONSULT NOTE  Pharmacy Consult for heparin infusion Indication: atrial fibrillation  No Known Allergies  Patient Measurements: Height: 6' (182.9 cm) Weight: 83.9 kg (184 lb 15.5 oz) IBW/kg (Calculated) : 77.6 Heparin Dosing Weight: 83.9 kg  Vital Signs: Temp: 97.9 F (36.6 C) (11/18 0451) BP: 163/88 (11/18 0451) Pulse Rate: 85 (11/18 0451)  Labs: Recent Labs    01/18/23 0336 01/18/23 1548 01/18/23 1937 01/19/23 0451 01/19/23 1348 01/19/23 2157 01/20/23 0524  HGB 9.3*  --   --  9.6*  --   --  9.7*  HCT 27.9*  --   --  29.3*  --   --  29.2*  PLT 197  --   --  204  --   --  235  APTT  --   --    < > 104* 74* 78* 92*  HEPARINUNFRC  --   --   --  >1.10*  --   --   --   CREATININE 0.96  --   --  0.85  --   --  0.86  CKTOTAL  --  25*  --   --   --   --   --    < > = values in this interval not displayed.    Estimated Creatinine Clearance: 68.9 mL/min (by C-G formula based on SCr of 0.86 mg/dL).   Medical History: Past Medical History:  Diagnosis Date   Cancer (HCC)    skin cancer basal   Carotid arterial disease (HCC)    a. 02/2016 L CEA; b. 01/2017 U/S: patent LICA, 1-39% RICA.   Celiac artery stenosis (HCC)    Coronary artery disease    a. 01/2016 MV: mild apical/basal inferior, apical lateral, mid anterolateral, and mid inferolateral ischemia. EF 57%; b. 02/2016 Cath: LM 40ost, LAD 70p/m, 20d, D1 95 (small), LCX nl, RCA 49m, RPDA 90 (small), EF 55-65%-->med Rx. Rec CABG for recurrent symptoms.   Hearing loss    History of echocardiogram    a. 02/2016 Echo: EF 50-55%, no rwma, mild MR.   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 Pharmacy consulted for Heparin dosing. Patient was taking Apixaban 2.5mg  bid prior to admission for afib, last dose was on 11/15 2038  Date Time HL/aPTT Rate/Comment  11/16 1937 aPTT 49 850 u/hr; Subtherapeutic 11/17 0451 >1.1 / 104 aPTT supratherapeutic / not correlating 11/17 1348 aPTT 74 Therapeutic x1 11/17 2157 aPTT 78 Therapeutic x 2 11/18 0524 aPTT 92 Therapeutic x 3   Goal of Therapy:  Heparin level 0.3-0.7 units/ml aPTT 66-102 seconds Monitor platelets by anticoagulation protocol: Yes   Plan:  Continue heparin infusion at 1050 units/hour Recheck aPTT daily w/ AM labs while therapeutic on heparin Use aPTT to adjust dose until correlation with HL Will check daily CBC while on Heparin infusion   Thank you for involving pharmacy in this patient's care.   Otelia Sergeant, PharmD, [facility] 01/20/2023 6:55 AM

## 2023-01-20 NOTE — Progress Notes (Signed)
PHARMACY - ANTICOAGULATION CONSULT NOTE  Pharmacy Consult for heparin infusion Indication: atrial fibrillation  No Known Allergies  Patient Measurements: Height: 6' (182.9 cm) Weight: 83.9 kg (184 lb 15.5 oz) IBW/kg (Calculated) : 77.6 Heparin Dosing Weight: 83.9 kg  Vital Signs: Temp: 98.4 F (36.9 C) (11/17 2032) BP: 163/101 (11/17 2032) Pulse Rate: 79 (11/17 2032)  Labs: Recent Labs    01/17/23 0507 01/18/23 0336 01/18/23 1548 01/18/23 1937 01/19/23 0451 01/19/23 1348 01/19/23 2157  HGB 9.7* 9.3*  --   --  9.6*  --   --   HCT 28.9* 27.9*  --   --  29.3*  --   --   PLT 228 197  --   --  204  --   --   APTT  --   --   --    < > 104* 74* 78*  HEPARINUNFRC  --   --   --   --  >1.10*  --   --   CREATININE 0.87 0.96  --   --  0.85  --   --   CKTOTAL  --   --  25*  --   --   --   --    < > = values in this interval not displayed.    Estimated Creatinine Clearance: 69.7 mL/min (by C-G formula based on SCr of 0.85 mg/dL).   Medical History: Past Medical History:  Diagnosis Date   Cancer (HCC)    skin cancer basal   Carotid arterial disease (HCC)    a. 02/2016 L CEA; b. 01/2017 U/S: patent LICA, 1-39% RICA.   Celiac artery stenosis (HCC)    Coronary artery disease    a. 01/2016 MV: mild apical/basal inferior, apical lateral, mid anterolateral, and mid inferolateral ischemia. EF 57%; b. 02/2016 Cath: LM 40ost, LAD 70p/m, 20d, D1 95 (small), LCX nl, RCA 8m, RPDA 90 (small), EF 55-65%-->med Rx. Rec CABG for recurrent symptoms.   Hearing loss    History of echocardiogram    a. 02/2016 Echo: EF 50-55%, no rwma, mild MR.   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 Pharmacy consulted for Heparin dosing. Patient was taking Apixaban 2.5mg  bid prior to admission for afib, last dose was on 11/15 2038  Date Time HL/aPTT Rate/Comment  11/16 1937 aPTT 49 850 u/hr; Subtherapeutic 11/17 0451 >1.1 / 104 aPTT supratherapeutic / not correlating 11/17 1348 aPTT 74 Therapeutic x1 11/17 2157 aPTT 78 Therapeutic x 2   Goal of Therapy:  Heparin level 0.3-0.7 units/ml aPTT 66-102 seconds Monitor platelets by anticoagulation protocol: Yes   Plan:  Continue heparin infusion at 1050 units/hour Recheck aPTT daily w/ AM labs while therapeutic on heparin Use aPTT to adjust dose until correlation with HL Will check daily CBC while on Heparin infusion   Thank you for involving pharmacy in this patient's care.   Bari Mantis PharmD Clinical Pharmacist 01/20/2023

## 2023-01-20 NOTE — Progress Notes (Signed)
PROGRESS NOTE    Brett Riley  UUV:[id number] DOB: [birth year] DOA: 01/14/2023 PCP: Dale Donaldson, MD   Assessment & Plan:   Principal Problem:   CVA (cerebral vascular accident) [facility]) Active Problems:   Vertebral osteomyelitis continue doxycycline until 10/24/2022 [facility])   Essential hypertension, benign   Dyslipidemia   Persistent atrial fibrillation (HCC)   Heart failure with preserved ejection fraction (HCC)   History of CAD (coronary artery disease)   CKD (chronic kidney disease), stage III (HCC)  Assessment and Plan: B/l pneumonia: as per CXR. Completed abx course. Bronchodilators prn. Encourage incentive spirometry   Somnolence & encephalopathy: mental status waxes and wanes. No previous dx of dementia. TSH is elevated, FT4 is WNL. B12 is elevated. FT3 is pending. B1 is pending. Continue on low dose of seroquel as per neuro. ?possible need for LP as per neuro. Neuro following and recs apprec  CVA: r/o as per MRI.   Possible fibromuscular dysplasia: as per CTA head & neck. No inpatient work-up needed as per vasc surg and pt can f/u w/ previously scheduled outpatient appointment   Vertebral osteomyelitis: completed abx course. Now with chronic lower extremity weakness. PT/OT recs home health   Chronic right knee pain: CT right knee shows Ovoid, mass-like mixed attenuation area abutting the anteromedial cortex of the distal femoral diaphysis, possible hematoma vs neoplasm, and recommends MRI. MRI right femur shows subacute hematoma. Continue w/ supportive care    CKDIIIa: Cr is better than baseline    Hx of CAD: no CP. Continue on plavix, statin, metoprolol    Chronic diastolic CHF: appears euvolemic. Continue on metoprolol, statin. Monitor I/Os.   Persistent a. fib: continue on metoprolol, diltiazem. Holding home eliquis & continue on IV heparin for likely LP on 01/20/23 as per neuro    HLD: continue on statin   HTN: continue on metoprolol, diltiazem      DVT  prophylaxis: IV heparin Code Status: DNR Family Communication: discussed pt's care w/ pt's family at bedside and answered their questions  Disposition Plan: likely d/c back home w/ HH   Level of care: Telemetry Medical Status is: Inpatient Remains inpatient appropriate because: severity of illness    Consultants:    Procedures:   Antimicrobials:   Subjective: Pt is still lethargic    Objective: Vitals:   01/19/23 0946 01/19/23 1728 01/19/23 2032 01/20/23 0451  BP: (!) 146/78 136/81 (!) 163/101 (!) 163/88  Pulse: 83 76 79 85  Resp: 20 18  18   Temp: 98.8 F (37.1 C) 97.9 F (36.6 C) 98.4 F (36.9 C) 97.9 F (36.6 C)  TempSrc:      SpO2: 97% 98% 95% 94%  Weight:      Height:        Intake/Output Summary (Last 24 hours) at 01/20/2023 0833 Last data filed at 01/20/2023 0328 Gross per 24 hour  Intake 1055.23 ml  Output 600 ml  Net 455.23 ml   Filed Weights   01/14/23 1112  Weight: 83.9 kg    Examination:  General exam: appears lethargic  Respiratory system: diminished breath sounds b/l  Cardiovascular system: S1 & S2+. No rubs or clicks Gastrointestinal system: abd is soft, NT, ND & normal bowel sounds Central nervous system: lethargic.   Psychiatry: judgement and insight are not at baseline. Flat mood and affect    Data Reviewed: I have personally reviewed following labs and imaging studies  CBC: Recent Labs  Lab 01/16/23 0444 01/17/23 0507 01/18/23 0336 01/19/23 0451 01/20/23 4742  WBC 7.2 7.4 7.4 6.3 6.5  HGB 9.2* 9.7* 9.3* 9.6* 9.7*  HCT 27.6* 28.9* 27.9* 29.3* 29.2*  MCV 94.5 94.1 95.2 96.7 94.2  PLT 215 228 197 204 235   Basic Metabolic Panel: Recent Labs  Lab 01/16/23 0444 01/17/23 0507 01/18/23 0336 01/19/23 0451 01/20/23 0524  NA 137 134* 135 137 137  K 3.7 4.0 3.3* 4.0 3.4*  CL 104 102 104 104 103  CO2 25 23 24 25 25   GLUCOSE 96 93 98 93 91  BUN 24* 23 24* 19 18  CREATININE 0.97 0.87 0.96 0.85 0.86  CALCIUM 10.1 9.8 9.8  10.2 9.9   GFR: Estimated Creatinine Clearance: 68.9 mL/min (by C-G formula based on SCr of 0.86 mg/dL). Liver Function Tests: Recent Labs  Lab 01/14/23 1114  AST 17  ALT 12  ALKPHOS 76  BILITOT 0.4  PROT 7.0  ALBUMIN 4.0   No results for input(s): "LIPASE", "AMYLASE" in the last 168 hours. No results for input(s): "AMMONIA" in the last 168 hours. Coagulation Profile: Recent Labs  Lab 01/14/23 1114  INR 1.3*   Cardiac Enzymes: Recent Labs  Lab 01/18/23 1548  CKTOTAL 25*   BNP (last 3 results) No results for input(s): "PROBNP" in the last 8760 hours. HbA1C: No results for input(s): "HGBA1C" in the last 72 hours. CBG: Recent Labs  Lab 01/18/23 1607  GLUCAP 107*   Lipid Profile: No results for input(s): "CHOL", "HDL", "LDLCALC", "TRIG", "CHOLHDL", "LDLDIRECT" in the last 72 hours.  Thyroid Function Tests: Recent Labs    01/18/23 0336  FREET4 1.07   Anemia Panel: Recent Labs    01/18/23 0336  VITAMINB12 1,067*   Sepsis Labs: No results for input(s): "PROCALCITON", "LATICACIDVEN" in the last 168 hours.  No results found for this or any previous visit (from the past 240 hour(s)).       Radiology Studies: MR Lumbar Spine W Wo Contrast  Result Date: 01/19/2023 CLINICAL DATA:  Follow-up examination for osteomyelitis discitis, low back pain. EXAM: MRI LUMBAR SPINE WITHOUT AND WITH CONTRAST TECHNIQUE: Multiplanar and multiecho pulse sequences of the lumbar spine were obtained without and with intravenous contrast. CONTRAST:  8mL GADAVIST GADOBUTROL 1 MMOL/ML IV SOLN COMPARISON:  Previous MRI from 08/02/2022. FINDINGS: Segmentation: Standard. Lowest well-formed disc space labeled the L5-S1 level. Alignment: Trace degenerative retrolisthesis of L2 on L3. Underlying trace dextroscoliosis. Alignment otherwise normal with preservation of the normal lumbar lordosis. Vertebrae: There has been interval evolution of previously seen changes related osteomyelitis discitis  at L1-2. Mildly progressive vertebral body height loss at the L1 and L2 vertebral bodies. Progressive intervertebral disc space narrowing with endplate irregularity about the L1-2 interspace. Previously seen marrow edema and enhancement within the vertebral bodies themselves has largely resolved. Residual endplate enhancement now seen on today's exam. No epidural phlegmon or other collection. Progressive disc bulge and endplate spurring posteriorly with progressive moderate spinal stenosis at this level. No other evidence for new infection elsewhere within the lumbar spine. Reactive endplate changes about the L4-5 and L5-S1 interspaces felt to be degenerative in nature, and are similar to prior. Underlying bone marrow signal intensity diffusely heterogeneous, similar to prior. No new worrisome osseous lesions. Conus medullaris and cauda equina: Conus extends to the L1 level. Conus and cauda equina appear normal. No abnormal enhancement. Paraspinal and other soft tissues: Diffuse edema seen within the lower paraspinous soft tissues, suspected be related to overall volume status. No loculated collections. Aortic atherosclerosis. Retroaortic left renal vein noted. Disc levels: T12-L1:  Mild disc bulge with right-sided endplate spurring. Mild facet hypertrophy. No spinal stenosis. Foramina remain patent. L1-2: Changes related to prior osteomyelitis discitis with intervertebral disc space narrowing, endplate irregularity, and progressive disc bulging with endplate spurring. Mild facet and ligament flavum hypertrophy. Progressive moderate spinal stenosis at this level. Mild-to-moderate bilateral L1 foraminal stenosis. L2-3: Degenerative intervertebral disc space narrowing with diffuse disc bulge, asymmetric to the right. Reactive endplate spurring. Mild facet and ligament flavum hypertrophy. Resultant mild canal with bilateral subarticular stenosis. Central canal remains adequately patent. Mild left L2 foraminal narrowing.  Right neural foramen remains patent. L3-4: Degenerative intervertebral disc space narrowing with disc desiccation and diffuse disc bulge. Reactive endplate spurring. Mild facet and ligament flavum hypertrophy. No significant spinal stenosis. Mild bilateral L3 foraminal narrowing. L4-5: Degenerative disc space narrowing with diffuse disc bulge and reactive endplate spurring. Superimposed small central disc protrusion mildly indents the ventral thecal sac. Mild facet and ligament flavum hypertrophy. Resultant mild right worse than left bilateral subarticular stenosis. Central canal remains patent. Mild to moderate bilateral L4 foraminal narrowing. L5-S1: Degenerative vertebral disc space narrowing reactive endplate spurring. Broad-based central disc protrusion mildly indents the ventral thecal sac. Mild right greater than left facet hypertrophy. Resultant mild narrowing of the right lateral recess. Central canal remains patent. Mild to moderate bilateral L5 foraminal narrowing. IMPRESSION: 1. Interval evolution of previously seen changes related to osteomyelitis discitis at L1-2 with progressive vertebral body height loss, intervertebral disc space narrowing, and endplate irregularity. Residual endplate enhancement at this level. Overall, appearance is improved from prior exam. No epidural collections. 2. Progressive disc bulging with endplate spurring at L1-2 secondary to #1 above, with worsened moderate spinal stenosis at this level. 3. Additional multilevel lumbar spondylosis with resultant mild lateral recess narrowing at L2-3, L4-5, and L5-S1 as above. Mild to moderate bilateral L1, L4, and L5 foraminal stenosis. Aortic Atherosclerosis (ICD10-I70.0). Electronically Signed   By: Rise Mu M.D.   On: 01/19/2023 05:55   MR BRAIN W WO CONTRAST  Result Date: 01/19/2023 CLINICAL DATA:  Initial evaluation for neuro deficit, stroke suspected. Vasculitis suspected. EXAM: MRI HEAD WITHOUT AND WITH CONTRAST  MRA HEAD WITHOUT CONTRAST TECHNIQUE: Multiplanar, multi-echo pulse sequences of the brain and surrounding structures were acquired without and with intravenous contrast. Angiographic images of the Circle of Willis were acquired using MRA technique without intravenous contrast. CONTRAST:  8mL GADAVIST GADOBUTROL 1 MMOL/ML IV SOLN COMPARISON:  Comparison made with prior MRI from 01/14/2023. FINDINGS: MRI HEAD FINDINGS Brain: Examination moderately degraded by motion artifact, limiting assessment. Age-related cerebral atrophy. Patchy and confluent T2/FLAIR hyperintensity involving the periventricular and deep white matter both cerebral hemispheres, consistent with chronic small vessel ischemic disease, moderate to advanced in nature. Single punctate focus of restricted diffusion seen involving the high posterior left frontal cortex near the vertex (series 8, image 46), consistent with a tiny acute ischemic infarct. No visible associated hemorrhage. No other acute or subacute ischemia. Gray-white matter differentiation otherwise maintained. No acute intracranial hemorrhage. Single punctate chronic microhemorrhage noted at the left parietal region (series 12, image 45), of doubtful significance in isolation. No mass lesion, midline shift or mass effect. Mild ventricular prominence related global parenchymal volume loss without hydrocephalus. No extra-axial fluid collection. Partially empty sella noted. No appreciable abnormal enhancement, although postcontrast sequences are markedly degraded by motion. Vascular: Major intracranial vascular flow voids are maintained. Skull and upper cervical spine: Craniocervical junction within normal limits. Bone marrow signal intensity normal. Small lipoma noted at the right forehead. No  other scalp soft tissue abnormality. Sinuses/Orbits: Globes and orbital soft tissues within normal limits. Small volume layering secretions noted within the right sphenoid sinus. Paranasal sinuses are  otherwise largely clear. Trace bilateral mastoid effusions, of doubtful significance. Visualized nasopharynx unremarkable. Other: None. MRA HEAD FINDINGS Anterior circulation: Examination degraded by motion artifact. Both internal carotid arteries are patent through the siphons without hemodynamically significant stenosis or other abnormality. A1 segments patent bilaterally. Left A1 hypoplastic. Normal anterior communicating artery complex. Both ACAs patent without stenosis. No M1 stenosis or occlusion. Distal MCA branches perfused and symmetric. No beading or irregularity. Posterior circulation: Both vertebral arteries widely patent without stenosis. Left PICA patent. Right PICA not well seen. Basilar patent without stenosis. Superior cerebral arteries patent bilaterally. Both PCAs primarily supplied via the basilar. Both PCAs widely patent to their distal aspects without stenosis. No beading or irregularity. Anatomic variants: Hypoplastic left A1 segment. No intracranial aneurysm or other vascular malformation. IMPRESSION: MRI HEAD: 1. Motion degraded exam. 2. Single punctate acute ischemic nonhemorrhagic cortical infarct involving the high posterior left frontal cortex near the vertex. 3. Otherwise stable brain MRI. Underlying age-related cerebral atrophy with moderate to advanced chronic microvascular ischemic disease. MRA HEAD: 1. Motion degraded exam. 2. Negative intracranial MRA for large vessel occlusion or other emergent finding. No hemodynamically significant or correctable stenosis. No evidence for MRA evidence for vasculitis. Electronically Signed   By: Rise Mu M.D.   On: 01/19/2023 05:22   MR ANGIO HEAD WO CONTRAST  Result Date: 01/19/2023 CLINICAL DATA:  Initial evaluation for neuro deficit, stroke suspected. Vasculitis suspected. EXAM: MRI HEAD WITHOUT AND WITH CONTRAST MRA HEAD WITHOUT CONTRAST TECHNIQUE: Multiplanar, multi-echo pulse sequences of the brain and surrounding  structures were acquired without and with intravenous contrast. Angiographic images of the Circle of Willis were acquired using MRA technique without intravenous contrast. CONTRAST:  8mL GADAVIST GADOBUTROL 1 MMOL/ML IV SOLN COMPARISON:  Comparison made with prior MRI from 01/14/2023. FINDINGS: MRI HEAD FINDINGS Brain: Examination moderately degraded by motion artifact, limiting assessment. Age-related cerebral atrophy. Patchy and confluent T2/FLAIR hyperintensity involving the periventricular and deep white matter both cerebral hemispheres, consistent with chronic small vessel ischemic disease, moderate to advanced in nature. Single punctate focus of restricted diffusion seen involving the high posterior left frontal cortex near the vertex (series 8, image 46), consistent with a tiny acute ischemic infarct. No visible associated hemorrhage. No other acute or subacute ischemia. Gray-white matter differentiation otherwise maintained. No acute intracranial hemorrhage. Single punctate chronic microhemorrhage noted at the left parietal region (series 12, image 45), of doubtful significance in isolation. No mass lesion, midline shift or mass effect. Mild ventricular prominence related global parenchymal volume loss without hydrocephalus. No extra-axial fluid collection. Partially empty sella noted. No appreciable abnormal enhancement, although postcontrast sequences are markedly degraded by motion. Vascular: Major intracranial vascular flow voids are maintained. Skull and upper cervical spine: Craniocervical junction within normal limits. Bone marrow signal intensity normal. Small lipoma noted at the right forehead. No other scalp soft tissue abnormality. Sinuses/Orbits: Globes and orbital soft tissues within normal limits. Small volume layering secretions noted within the right sphenoid sinus. Paranasal sinuses are otherwise largely clear. Trace bilateral mastoid effusions, of doubtful significance. Visualized  nasopharynx unremarkable. Other: None. MRA HEAD FINDINGS Anterior circulation: Examination degraded by motion artifact. Both internal carotid arteries are patent through the siphons without hemodynamically significant stenosis or other abnormality. A1 segments patent bilaterally. Left A1 hypoplastic. Normal anterior communicating artery complex. Both ACAs patent without stenosis. No M1  stenosis or occlusion. Distal MCA branches perfused and symmetric. No beading or irregularity. Posterior circulation: Both vertebral arteries widely patent without stenosis. Left PICA patent. Right PICA not well seen. Basilar patent without stenosis. Superior cerebral arteries patent bilaterally. Both PCAs primarily supplied via the basilar. Both PCAs widely patent to their distal aspects without stenosis. No beading or irregularity. Anatomic variants: Hypoplastic left A1 segment. No intracranial aneurysm or other vascular malformation. IMPRESSION: MRI HEAD: 1. Motion degraded exam. 2. Single punctate acute ischemic nonhemorrhagic cortical infarct involving the high posterior left frontal cortex near the vertex. 3. Otherwise stable brain MRI. Underlying age-related cerebral atrophy with moderate to advanced chronic microvascular ischemic disease. MRA HEAD: 1. Motion degraded exam. 2. Negative intracranial MRA for large vessel occlusion or other emergent finding. No hemodynamically significant or correctable stenosis. No evidence for MRA evidence for vasculitis. Electronically Signed   By: Rise Mu M.D.   On: 01/19/2023 05:22        Scheduled Meds:  clopidogrel  75 mg Oral Daily   diltiazem  360 mg Oral Daily   feeding supplement  237 mL Oral BID BM   metoprolol succinate  100 mg Oral Daily   pantoprazole  40 mg Oral q morning   QUEtiapine  12.5 mg Oral QHS   rosuvastatin  10 mg Oral Daily   thiamine  100 mg Oral Daily   Continuous Infusions:  cefTRIAXone (ROCEPHIN)  IV Stopped (01/19/23 1342)   heparin  1,050 Units/hr (01/20/23 0328)     LOS: 6 days       Charise Killian, MD [facility] Pager [phone]  If 7PM-7AM, please contact night-coverage [email or web address] 01/20/2023, 8:33 AM

## 2023-01-20 NOTE — Consult Note (Signed)
PULMONOLOGY         Date: 01/20/2023,   MRN# [id number] Brett Riley [birth year]     AdmissionWeight: 83.9 kg                 CurrentWeight: 83.9 kg  Referring provider: Dr Mayford Knife   CHIEF COMPLAINT:   Recurrent hospitalizations with possible vasculitis   HISTORY OF PRESENT ILLNESS   This is an 87 yo male with history of HFpEF, CAD, AF, HTN,CKD with AMS dysarthria concern for possible acute CVA. Wife reports recent onset of poor PO intake. Patient was hospitalized in August 2024 with vertebral osteo and completed IV abx per ID recommendations.  He progressively became more encephalopathic and somnolent.  Bloodwork has been done to eval hormonal/electorlyte imbalance.  PCCM consultation for possible autoimmune vasculitis.    PAST MEDICAL HISTORY   Past Medical History:  Diagnosis Date   Cancer [facility])    skin cancer basal   Carotid arterial disease (HCC)    a. 02/2016 L CEA; b. 01/2017 U/S: patent LICA, 1-39% RICA.   Celiac artery stenosis (HCC)    Coronary artery disease    a. 01/2016 MV: mild apical/basal inferior, apical lateral, mid anterolateral, and mid inferolateral ischemia. EF 57%; b. 02/2016 Cath: LM 40ost, LAD 70p/m, 20d, D1 95 (small), LCX nl, RCA 35m, RPDA 90 (small), EF 55-65%-->med Rx. Rec CABG for recurrent symptoms.   Hearing loss    History of echocardiogram    a. 02/2016 Echo: EF 50-55%, no rwma, mild MR.   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   Osteoarthritis, hip, bilateral    Left > Right     SURGICAL HISTORY   Past Surgical History:  Procedure Laterality Date   AORTIC INTERVENTION N/A 07/24/2022   Procedure: AORTIC INTERVENTION;  Surgeon: Renford Dills, MD;  Location: [facility] INVASIVE CV LAB;  Service: Cardiovascular;  Laterality: N/A;   CARDIAC CATHETERIZATION N/A 01/19/2016   Procedure: Left Heart Cath and Coronary Angiography;  Surgeon: Iran Ouch, MD;  Location: [facility] INVASIVE CV LAB;  Service: Cardiovascular;  Laterality: N/A;   COLONOSCOPY     CYSTOSCOPY/URETEROSCOPY/HOLMIUM LASER/STENT PLACEMENT Right 04/13/2021   Procedure: CYSTOSCOPY/URETEROSCOPY/HOLMIUM LASER/STENT PLACEMENT;  Surgeon: Sondra Come, MD;  Location: [facility] ORS;  Service: Urology;  Laterality: Right;   ENDARTERECTOMY Left 02/15/2016   Procedure: ENDARTERECTOMY CAROTID;  Surgeon: Annice Needy, MD;  Location: [facility] ORS;  Service: Vascular;  Laterality: Left;   RIGHT HEART CATH N/A 09/19/2022   Procedure: RIGHT HEART CATH;  Surgeon: Yvonne Kendall, MD;  Location: [facility] INVASIVE CV LAB;  Service: Cardiovascular;  Laterality: N/A;   TEE WITHOUT CARDIOVERSION N/A 08/05/2022   Procedure: TRANSESOPHAGEAL ECHOCARDIOGRAM;  Surgeon: Debbe Odea, MD;  Location: [facility] ORS;  Service: Cardiovascular;  Laterality: N/A;     FAMILY HISTORY   Family History  Problem Relation Age of Onset   Stroke Mother    Heart disease Father        MI   Heart attack Father    Breast cancer Sister    Colon cancer Neg Hx    Prostate cancer Neg Hx      SOCIAL HISTORY   Social History   Tobacco Use   Smoking status: Never   Smokeless tobacco: Never  Vaping Use   Vaping status: Never Used  Substance Use Topics   Alcohol use: No  Alcohol/week: 0.0 standard drinks of alcohol   Drug use: No     MEDICATIONS    Home Medication:    Current Medication:  Current Facility-Administered Medications:    acetaminophen (TYLENOL) tablet 650 mg, 650 mg, Oral, Q4H PRN, 650 mg at 01/20/23 1609 **OR** acetaminophen (TYLENOL) 160 MG/5ML solution 650 mg, 650 mg, Per Tube, Q4H PRN **OR** acetaminophen (TYLENOL) suppository 650 mg, 650 mg, Rectal, Q4H PRN, Floydene Flock, MD   cefTRIAXone (ROCEPHIN) 2 g in sodium chloride 0.9 % 100 mL IVPB, 2 g, Intravenous, Q24H, Charise Killian, MD, Last Rate: 200 mL/hr at 01/20/23 1152, 2 g at 01/20/23 1152   clopidogrel (PLAVIX) tablet 75 mg,  75 mg, Oral, Daily, Floydene Flock, MD, 75 mg at 01/19/23 0958   diltiazem (CARDIZEM CD) 24 hr capsule 360 mg, 360 mg, Oral, Daily, Floydene Flock, MD, 360 mg at 01/20/23 0956   feeding supplement (ENSURE ENLIVE / ENSURE PLUS) liquid 237 mL, 237 mL, Oral, BID BM, Charise Killian, MD, 237 mL at 01/20/23 0956   heparin ADULT infusion 100 units/mL (25000 units/223mL), 1,050 Units/hr, Intravenous, Continuous, Belue, Lendon Collar, RPH, Last Rate: 10.5 mL/hr at 01/20/23 1150, 1,050 Units/hr at 01/20/23 1150   ipratropium-albuterol (DUONEB) 0.5-2.5 (3) MG/3ML nebulizer solution 3 mL, 3 mL, Nebulization, Q6H PRN, Charise Killian, MD   labetalol (NORMODYNE) injection 10 mg, 10 mg, Intravenous, Q2H PRN, Floydene Flock, MD   lidocaine (LMX) 4 % cream, , Topical, BID PRN, Bhagat, Srishti L, MD   metoprolol succinate (TOPROL-XL) 24 hr tablet 100 mg, 100 mg, Oral, Daily, Mayford Knife, Jamiese M, MD, 100 mg at 01/20/23 4098   Oral care mouth rinse, 15 mL, Mouth Rinse, PRN, Charise Killian, MD   pantoprazole (PROTONIX) EC tablet 40 mg, 40 mg, Oral, q morning, Floydene Flock, MD, 40 mg at 01/20/23 0956   polyethylene glycol (MIRALAX / GLYCOLAX) packet 17 g, 17 g, Oral, Daily PRN, Charise Killian, MD, 17 g at 01/15/23 1314   QUEtiapine (SEROQUEL) tablet 12.5 mg, 12.5 mg, Oral, QHS PRN, Bhagat, Srishti L, MD   QUEtiapine (SEROQUEL) tablet 12.5 mg, 12.5 mg, Oral, QHS, Bhagat, Srishti L, MD, 12.5 mg at 01/19/23 2012   rosuvastatin (CRESTOR) tablet 10 mg, 10 mg, Oral, Daily, Jaynie Bream, RPH, 10 mg at 01/20/23 1191   thiamine (VITAMIN B1) tablet 100 mg, 100 mg, Oral, Daily, Charise Killian, MD, 100 mg at 01/20/23 4782    ALLERGIES   Patient has no known allergies.     REVIEW OF SYSTEMS    Review of Systems:  Gen:  Denies  fever, sweats, chills weigh loss  HEENT: Denies blurred vision, double vision, ear pain, eye pain, hearing loss, nose bleeds, sore throat Cardiac:  No  dizziness, chest pain or heaviness, chest tightness,edema Resp:   reports dyspnea chronically  Gi: Denies swallowing difficulty, stomach pain, nausea or vomiting, diarrhea, constipation, bowel incontinence Gu:  Denies bladder incontinence, burning urine Ext:   Denies Joint pain, stiffness or swelling Skin: Denies  skin rash, easy bruising or bleeding or hives Endoc:  Denies polyuria, polydipsia , polyphagia or weight change Psych:   Denies depression, insomnia or hallucinations   Other:  All other systems negative   VS: BP 130/66 (BP Location: Left Arm)   Pulse 79   Temp 99.1 F (37.3 C)   Resp 18   Ht 6' (1.829 m)   Wt 83.9 kg   SpO2 99%   BMI 25.09  kg/m      PHYSICAL EXAM    GENERAL:NAD, no fevers, chills, no weakness no fatigue HEAD: Normocephalic, atraumatic.  EYES: Pupils equal, round, reactive to light. Extraocular muscles intact. No scleral icterus.  MOUTH: Moist mucosal membrane. Dentition intact. No abscess noted.  EAR, NOSE, THROAT: Clear without exudates. No external lesions.  NECK: Supple. No thyromegaly. No nodules. No JVD.  PULMONARY: decreased breath sounds with mild rhonchi worse at bases bilaterally.  CARDIOVASCULAR: S1 and S2. Regular rate and rhythm. No murmurs, rubs, or gallops. No edema. Pedal pulses 2+ bilaterally.  GASTROINTESTINAL: Soft, nontender, nondistended. No masses. Positive bowel sounds. No hepatosplenomegaly.  MUSCULOSKELETAL: No swelling, clubbing, or edema. Range of motion full in all extremities.  NEUROLOGIC: Cranial nerves II through XII are intact. No gross focal neurological deficits. Sensation intact. Reflexes intact.  SKIN: No ulceration, lesions, rashes, or cyanosis. Skin warm and dry. Turgor intact.  PSYCHIATRIC: Mood, affect within normal limits. The patient is awake, alert and oriented x 3. Insight, judgment intact.       IMAGING     Narrative & Impression  CLINICAL DATA:  Diffuse/interstitial lung disease   EXAM: CT  CHEST WITHOUT CONTRAST   TECHNIQUE: Multidetector CT imaging of the chest was performed following the standard protocol without IV contrast.   RADIATION DOSE REDUCTION: This exam was performed according to the departmental dose-optimization program which includes automated exposure control, adjustment of the mA and/or kV according to patient size and/or use of iterative reconstruction technique.   COMPARISON:  None Available.   FINDINGS: Cardiovascular: Similar contour abnormality of the ascending thoracic aorta just proximal to the arch. Cardiomegaly. No sizable pericardial effusion. Coronary artery and aortic atherosclerosis.   Mediastinum/Nodes: No enlarged mediastinal or axillary lymph nodes. Thyroid gland, trachea, and esophagus demonstrate no significant findings.   Lungs/Pleura: Previously seen dependent bibasilar opacities have improved. Mild residual linear opacities and mild bronchiectasis in these this region. No confluent consolidation. No pneumothorax. No pleural effusions.   Upper Abdomen: No acute abnormality.   Musculoskeletal: No acute fracture.   IMPRESSION: 1. Previously seen dependent bibasilar opacities have improved. Mild residual linear opacities and mild bronchiectasis in these this region likely represents post infectious scarring. No confluent consolidation. 2. Similar contour abnormality of the ascending aorta, representing either a penetrating ulcer or aneurysm. Recommend follow-up CT angiography when clinically feasible. 3.  Aortic Atherosclerosis (ICD10-I70.0).     Electronically Signed   By: Feliberto Harts M.D.   On: 01/18/2023 12:21   ASSESSMENT/PLAN    Bilateral non cystic fibrosis bronchiectasis   - noted neurology workup with altered mental status   - plan for LP    - autoimmune serology workup while inpatient    -will continue to follow along with you   -patient empirically on rocephin and is on duoneb nebulizer soln           Thank you for allowing me to participate in the care of this patient.   Patient/Family are satisfied with care plan and all questions have been answered.    Provider disclosure: Patient with at least one acute or chronic illness or injury that poses a threat to life or bodily function and is being managed actively during this encounter.  All of the below services have been performed independently by signing provider:  review of prior documentation from internal and or external health records.  Review of previous and current lab results.  Interview and comprehensive assessment during patient visit today. Review of current and  previous chest radiographs/CT scans. Discussion of management and test interpretation with health care team and patient/family.   This document was prepared using Dragon voice recognition software and may include unintentional dictation errors.     Vida Rigger, M.D.  Division of Pulmonary & Critical Care Medicine

## 2023-01-20 NOTE — Telephone Encounter (Signed)
Per [place] tech patient had studies will call wife to schedule follow up appointment with provider only. No studies needed with fu

## 2023-01-20 NOTE — Progress Notes (Signed)
Physical Therapy Treatment Patient Details Name: Brett Riley MRN: [id number] DOB: [birth year] Today's Date: 01/20/2023   History of Present Illness Pt is an 87 y.o. male with medical history significant of diastolic CHF, CAD, PAF on Eliquis, vertebral osteomyelitis, and HTN presenting with suspected CVA with dysarthria and AMS.  Imaging completed with CVA ruled out.  MD assessment includes: bilateral PNA, somnolence and encephalopathy, and subacute hematoma of the R knee.    PT Comments  Pt alert with flat affect but eager to participate throughout the session following all 1-step commands well with multi-modal cuing.  Pt required physical assistance with all functional tasks per below but grossly less physical assistance with sit to/from stands.  Pt did require the EOB to be moderately elevated in order to come to standing with reasonable assistance and once in standing was able to remain in standing around 30 sec during the first attempt and 10 sec during the second attempt.  Pt cued to try to improved standing posture but remained in a trunk flexed position throughout time in standing.  Pt will benefit from continued PT services upon discharge to safely address deficits listed in patient problem list for decreased caregiver assistance and eventual return to PLOF.     If plan is discharge home, recommend the following: Two people to help with walking and/or transfers;A lot of help with bathing/dressing/bathroom;Assistance with cooking/housework;Assistance with feeding;Direct supervision/assist for medications management;Direct supervision/assist for financial management;Assist for transportation;Help with stairs or ramp for entrance   Can travel by private vehicle        Equipment Recommendations  Hospital bed    Recommendations for Other Services       Precautions / Restrictions Precautions Precautions: Fall Restrictions RLE Weight Bearing: Weight bearing as tolerated Other  Position/Activity Restrictions: Per Dr. Mayford Knife imaging ruled out R knee fracture, WBAT to the RLE     Mobility  Bed Mobility Overal bed mobility: Needs Assistance Bed Mobility: Supine to Sit, Sit to Supine, Rolling Rolling: Mod assist Sidelying to sit: Mod assist, +2 for physical assistance   Sit to supine: +2 for physical assistance, Max assist   General bed mobility comments: Pt required assist for BLEs and trunk with mod verbal cues for sequencing for increased patient participation using bed rails    Transfers Overall transfer level: Needs assistance   Transfers: Sit to/from Stand Sit to Stand: Min assist, From elevated surface, +2 physical assistance           General transfer comment: Mod verbal and tactile cues for sequencing for hand and foot placement and for increase trunk flexion; standing took place from elevated EOB with pt reaching to sink bowl to assist coming to standing    Ambulation/Gait               General Gait Details: Unable   Stairs             Wheelchair Mobility     Tilt Bed    Modified Rankin (Stroke Patients Only)       Balance Overall balance assessment: Needs assistance Sitting-balance support: Feet supported, Bilateral upper extremity supported Sitting balance-Leahy Scale: Poor Sitting balance - Comments: Occasional min to mod A to prevent posterior LOB in sitting, improved grossly as session progressed Postural control: Posterior lean Standing balance support: Bilateral upper extremity supported Standing balance-Leahy Scale: Poor  Cognition Arousal: Alert Behavior During Therapy: Flat affect Overall Cognitive Status: Within Functional Limits for tasks assessed                                          Exercises Total Joint Exercises Ankle Circles/Pumps: AROM, Strengthening, Both, 5 reps, 10 reps Quad Sets: Strengthening, Both, 10 reps, 5 reps Gluteal  Sets: Strengthening, 10 reps, Both Straight Leg Raises: AAROM, Strengthening, Both, 5 reps Long Arc Quad: AAROM, Strengthening, Both, 5 reps Other Exercises Other Exercises: Anterior weight shifting in sitting at EOB to address posterior lean Other Exercises: HEP education and practice for BLE APs, QS, and GS x 10 each every 1-2 hours daily    General Comments        Pertinent Vitals/Pain Pain Assessment Pain Assessment: No/denies pain    Home Living                          Prior Function            PT Goals (current goals can now be found in the care plan section) Progress towards PT goals: Progressing toward goals    Frequency    Min 1X/week      PT Plan      Co-evaluation              AM-PAC PT "6 Clicks" Mobility   Outcome Measure  Help needed turning from your back to your side while in a flat bed without using bedrails?: A Lot Help needed moving from lying on your back to sitting on the side of a flat bed without using bedrails?: Total Help needed moving to and from a bed to a chair (including a wheelchair)?: Total Help needed standing up from a chair using your arms (e.g., wheelchair or bedside chair)?: Total Help needed to walk in hospital room?: Total Help needed climbing 3-5 steps with a railing? : Total 6 Click Score: 7    End of Session Equipment Utilized During Treatment: Gait belt Activity Tolerance: Patient tolerated treatment well Patient left: in bed;with call bell/phone within reach;with bed alarm set Nurse Communication: Mobility status PT Visit Diagnosis: Muscle weakness (generalized) (M62.81);Difficulty in walking, not elsewhere classified (R26.2)     Time: 6213-0865 PT Time Calculation (min) (ACUTE ONLY): 25 min  Charges:    $Therapeutic Exercise: 8-22 mins $Therapeutic Activity: 8-22 mins PT General Charges $$ ACUTE PT VISIT: 1 Visit                     D. Scott Isys Tietje PT, DPT 01/20/23, 2:57 PM

## 2023-01-20 NOTE — Plan of Care (Signed)
  Problem: Clinical Measurements: Goal: Ability to maintain clinical measurements within normal limits will improve Outcome: Progressing Goal: Will remain free from infection Outcome: Progressing   Problem: Activity: Goal: Risk for activity intolerance will decrease Outcome: Progressing   Problem: Nutrition: Goal: Adequate nutrition will be maintained Outcome: Progressing   Problem: Pain Management: Goal: General experience of comfort will improve Outcome: Progressing   Problem: Skin Integrity: Goal: Risk for impaired skin integrity will decrease Outcome: Progressing

## 2023-01-20 NOTE — Telephone Encounter (Signed)
Patient's wife called to cancel Evar Duplex, Carotid and Fu appointment with JD. Patient is currently in the hospital and is immobile and it is very difficult for her to get him out of the house for appointments. Is there any way that patient can get tests ([place]) done while patient is in the hospital? It is just his 1 year follow up appointment.

## 2023-01-21 ENCOUNTER — Ambulatory Visit (INDEPENDENT_AMBULATORY_CARE_PROVIDER_SITE_OTHER): Payer: Medicare HMO | Admitting: Vascular Surgery

## 2023-01-21 ENCOUNTER — Other Ambulatory Visit (INDEPENDENT_AMBULATORY_CARE_PROVIDER_SITE_OTHER): Payer: Medicare HMO

## 2023-01-21 ENCOUNTER — Encounter (INDEPENDENT_AMBULATORY_CARE_PROVIDER_SITE_OTHER): Payer: Medicare HMO

## 2023-01-21 DIAGNOSIS — G9340 Encephalopathy, unspecified: Secondary | ICD-10-CM | POA: Diagnosis not present

## 2023-01-21 LAB — CBC
HCT: 27.5 % — ABNORMAL LOW (ref 39.0–52.0)
Hemoglobin: 9.1 g/dL — ABNORMAL LOW (ref 13.0–17.0)
MCH: 31.8 pg (ref 26.0–34.0)
MCHC: 33.1 g/dL (ref 30.0–36.0)
MCV: 96.2 fL (ref 80.0–100.0)
Platelets: 223 10*3/uL (ref 150–400)
RBC: 2.86 MIL/uL — ABNORMAL LOW (ref 4.22–5.81)
RDW: 14.9 % (ref 11.5–15.5)
WBC: 6.1 10*3/uL (ref 4.0–10.5)
nRBC: 0 % (ref 0.0–0.2)

## 2023-01-21 LAB — ANCA PROFILE
Anti-MPO Antibodies: <0.2 U (ref 0.0–0.9)
Anti-PR3 Antibodies: <0.2 U (ref 0.0–0.9)
Atypical P-ANCA titer: <1:20 {titer}
C-ANCA: <1:20 {titer}
P-ANCA: <1:20 {titer}

## 2023-01-21 LAB — CARDIOLIPIN ANTIBODIES, IGG, IGM, IGA
Anticardiolipin IgA: <9 U/mL (ref 0–11)
Anticardiolipin IgG: <9 GPL U/mL (ref 0–14)
Anticardiolipin IgM: <9 [MPL'U]/mL (ref 0–12)

## 2023-01-21 LAB — BETA-2-GLYCOPROTEIN I ABS, IGG/M/A
Beta-2 Glyco I IgG: <9 GPI IgG units (ref 0–20)
Beta-2-Glycoprotein I IgA: <9 GPI IgA units (ref 0–25)
Beta-2-Glycoprotein I IgM: <9 GPI IgM units (ref 0–32)

## 2023-01-21 LAB — ALDOLASE: Aldolase: 2.3 U/L — ABNORMAL LOW (ref 3.3–10.3)

## 2023-01-21 LAB — VITAMIN B1: Vitamin B1 (Thiamine): 99.7 nmol/L (ref 66.5–200.0)

## 2023-01-21 LAB — APTT: aPTT: 49 s — ABNORMAL HIGH (ref 24–36)

## 2023-01-21 LAB — HEPARIN LEVEL (UNFRACTIONATED): Heparin Unfractionated: 0.84 [IU]/mL — ABNORMAL HIGH (ref 0.30–0.70)

## 2023-01-21 MED ORDER — HEPARIN BOLUS VIA INFUSION
2500.0000 [IU] | Freq: Once | INTRAVENOUS | Status: AC
  Administered 2023-01-21: 2500 [IU] via INTRAVENOUS
  Filled 2023-01-21: qty 2500

## 2023-01-21 MED ORDER — APIXABAN 2.5 MG PO TABS
2.5000 mg | ORAL_TABLET | Freq: Two times a day (BID) | ORAL | Status: DC
  Administered 2023-01-21 – 2023-01-23 (×5): 2.5 mg via ORAL
  Filled 2023-01-21 (×5): qty 1

## 2023-01-21 NOTE — Progress Notes (Signed)
PROGRESS NOTE   HPI was taken from Dr. Alvester Morin: Brett Riley is a 87 y.o. male with medical history significant of diastolic CHF, CAD, PAF on Eliquis, HTN presenting with suspected CVA.  History primarily from wife in the setting of dysarthria.  Per report, patient with noted slurred speech last night by the wife.  Also with worsening confusion.  Patient usually oriented x 4 per wife.  No chest pain shortness of breath.  No nausea or vomiting.  Also with lethargy.  Slurred speech and confusion continued into today.  Positive decreased p.o. intake.  No fevers or chills.  Baseline atrial fibrillation and CAD.  Has been compliant with home medications including Eliquis and Plavix.  Noted admission August 2024 for commune acquired pneumonia also had vertebral osteomyelitis with completion of antibiotics October 24, 2022.  No cough.  Has had chronic lower extremity weakness since episode of osteomyelitis. Presented to the ER afebrile, pressures 160s to 170s over 90s to 100s.  Satting well on room air.  White count 8.5, hemoglobin 11.1, platelets 250.  Creatinine 1.05.  Urinalysis stable.  CT head stable.  EKG atrial fibrillation with nonspecific ST and T wave changes.  As per Dr. Mayford Knife 11/13-11/19/24: Pt was initially thought have b/l pneumonia seen on CXR and completed abx course. CT chest did show improvement of bibasilar opacities. Of note, pt's wife requested neuro consulted for pt's somnolence and encephalopathy. Neuro work so far shown possible small CVA vs artifact as per neuro. Likely delirium and not likely rapidly-progressive dementia so LP is not indicated as this time as per neuro. Please see neuro notes for further explanation. Of note, pt is waiting hospital bed and bedside commode to be delivered before pt can be d/c.    [name]  WUJ:[id number] DOB: Jul 21, 1935 DOA: 01/14/2023 PCP: Dale [place], MD   Assessment & Plan:   Principal Problem:   CVA (cerebral vascular accident)  [facility]) Active Problems:   Vertebral osteomyelitis continue doxycycline until 10/24/2022 [facility])   Essential hypertension, benign   Dyslipidemia   Persistent atrial fibrillation (HCC)   Heart failure with preserved ejection fraction (HCC)   History of CAD (coronary artery disease)   CKD (chronic kidney disease), stage III (HCC)  Assessment and Plan: B/l pneumonia: as per CXR. Completed abx course. Bronchodilators prn. Encourage incentive spirometry   Somnolence & encephalopathy: mental status waxes and wanes. No previous dx of dementia. TSH is elevated, FT4, FT3 are both WNL. B12 is elevated.  B1 is WNL. Continue on low dose of seroquel as per neuro. No need for LP at this time as per neuro. Neuro following and recs apprec. See neuro notes for more info   Possible fibromuscular dysplasia: as per CTA head & neck. No inpatient work-up needed as per vasc surg and pt can f/u w/ previously scheduled outpatient appointment   Vertebral osteomyelitis: completed abx course. Now with chronic lower extremity weakness. PT/OT recs home health   Chronic right knee pain: CT right knee shows Ovoid, mass-like mixed attenuation area abutting the anteromedial cortex of the distal femoral diaphysis, possible hematoma vs neoplasm, and recommends MRI. MRI right femur shows subacute hematoma. Continue w/ supportive care    CKDIIIa: Cr is better than baseline    Hx of CAD: no CP. Continue on metoprolol, plavix, statin    Chronic diastolic CHF: appears euvolemic. Continue on statin, metoprolol. Monitor I/Os    Persistent a. fib: continue on metoprolol, diltiazem. Restart home dose of eliquis   HLD:  continue on statin   HTN: continue on CCB, BB      DVT prophylaxis: eliquis  Code Status: DNR Family Communication: discussed pt's care w/ pt's family at bedside and answered their questions  Disposition Plan: likely d/c back home w/ HH   Level of care: Telemetry Medical Status is: Inpatient Remains inpatient  appropriate because: waiting on DME to be delivered to pt's home prior to d/c     Consultants:  Neuro  Pulmon   Procedures:   Antimicrobials:   Subjective: Pt c/o fatigue   Objective: Vitals:   01/20/23 0451 01/20/23 1552 01/20/23 2108 01/21/23 0350  BP: (!) 163/88 130/66 129/75 (!) 136/96  Pulse: 85 79 75 82  Resp: 18 18 18 18   Temp: 97.9 F (36.6 C) 99.1 F (37.3 C) 97.9 F (36.6 C) 97.8 F (36.6 C)  TempSrc:   Oral   SpO2: 94% 99% 100% 97%  Weight:      Height:        Intake/Output Summary (Last 24 hours) at 01/21/2023 0804 Last data filed at 01/20/2023 2027 Gross per 24 hour  Intake 820 ml  Output --  Net 820 ml   Filed Weights   01/14/23 1112  Weight: 83.9 kg    Examination:  General exam: appears awake & alert Respiratory system: decreased breath sounds b/l Cardiovascular system: S1/S2+. No rubs or clicks  Gastrointestinal system: abd is soft, NT, ND & normal bowel sounds Central nervous system: Alert & awake. Moves all extremities  Psychiatry: judgement and insight are not at baseline. Flat mood and affect    Data Reviewed: I have personally reviewed following labs and imaging studies  CBC: Recent Labs  Lab 01/17/23 0507 01/18/23 0336 01/19/23 0451 01/20/23 0524 01/21/23 0517  WBC 7.4 7.4 6.3 6.5 6.1  HGB 9.7* 9.3* 9.6* 9.7* 9.1*  HCT 28.9* 27.9* 29.3* 29.2* 27.5*  MCV 94.1 95.2 96.7 94.2 96.2  PLT 228 197 204 235 223   Basic Metabolic Panel: Recent Labs  Lab 01/16/23 0444 01/17/23 0507 01/18/23 0336 01/19/23 0451 01/20/23 0524  NA 137 134* 135 137 137  K 3.7 4.0 3.3* 4.0 3.4*  CL 104 102 104 104 103  CO2 25 23 24 25 25   GLUCOSE 96 93 98 93 91  BUN 24* 23 24* 19 18  CREATININE 0.97 0.87 0.96 0.85 0.86  CALCIUM 10.1 9.8 9.8 10.2 9.9   GFR: Estimated Creatinine Clearance: 68.9 mL/min (by C-G formula based on SCr of 0.86 mg/dL). Liver Function Tests: Recent Labs  Lab 01/14/23 1114  AST 17  ALT 12  ALKPHOS 76  BILITOT  0.4  PROT 7.0  ALBUMIN 4.0   No results for input(s): "LIPASE", "AMYLASE" in the last 168 hours. No results for input(s): "AMMONIA" in the last 168 hours. Coagulation Profile: Recent Labs  Lab 01/14/23 1114  INR 1.3*   Cardiac Enzymes: Recent Labs  Lab 01/18/23 1548  CKTOTAL 25*   BNP (last 3 results) No results for input(s): "PROBNP" in the last 8760 hours. HbA1C: No results for input(s): "HGBA1C" in the last 72 hours. CBG: Recent Labs  Lab 01/18/23 1607  GLUCAP 107*   Lipid Profile: No results for input(s): "CHOL", "HDL", "LDLCALC", "TRIG", "CHOLHDL", "LDLDIRECT" in the last 72 hours.  Thyroid Function Tests: No results for input(s): "TSH", "T4TOTAL", "FREET4", "T3FREE", "THYROIDAB" in the last 72 hours.  Anemia Panel: No results for input(s): "VITAMINB12", "FOLATE", "FERRITIN", "TIBC", "IRON", "RETICCTPCT" in the last 72 hours.  Sepsis Labs: No results  for input(s): "PROCALCITON", "LATICACIDVEN" in the last 168 hours.  No results found for this or any previous visit (from the past 240 hour(s)).       Radiology Studies: No results found.      Scheduled Meds:  clopidogrel  75 mg Oral Daily   diltiazem  360 mg Oral Daily   feeding supplement  237 mL Oral BID BM   heparin  2,500 Units Intravenous Once   metoprolol succinate  100 mg Oral Daily   pantoprazole  40 mg Oral q morning   QUEtiapine  12.5 mg Oral QHS   rosuvastatin  10 mg Oral Daily   thiamine  100 mg Oral Daily   Continuous Infusions:  cefTRIAXone (ROCEPHIN)  IV 2 g (01/20/23 1152)   heparin 1,050 Units/hr (01/20/23 1150)     LOS: 7 days       Charise Killian, MD [facility] Pager [phone]  If 7PM-7AM, please contact night-coverage [email or web address] 01/21/2023, 8:04 AM

## 2023-01-21 NOTE — Progress Notes (Signed)
PHARMACY - ANTICOAGULATION CONSULT NOTE  Pharmacy Consult for heparin infusion Indication: atrial fibrillation  No Known Allergies  Patient Measurements: Height: 6' (182.9 cm) Weight: 83.9 kg (184 lb 15.5 oz) IBW/kg (Calculated) : 77.6 Heparin Dosing Weight: 83.9 kg  Vital Signs: Temp: 97.8 F (36.6 C) (11/19 0350) Temp Source: Oral (11/18 2108) BP: 136/96 (11/19 0350) Pulse Rate: 82 (11/19 0350)  Labs: Recent Labs    01/18/23 1548 01/18/23 1937 01/19/23 0451 01/19/23 1348 01/19/23 2157 01/20/23 0524 01/21/23 0517  HGB  --    < > 9.6*  --   --  9.7* 9.1*  HCT  --   --  29.3*  --   --  29.2* 27.5*  PLT  --   --  204  --   --  235 223  APTT  --    < > 104*   < > 78* 92* 49*  HEPARINUNFRC  --   --  >1.10*  --   --  >1.10* 0.84*  CREATININE  --   --  0.85  --   --  0.86  --   CKTOTAL 25*  --   --   --   --   --   --    < > = values in this interval not displayed.    Estimated Creatinine Clearance: 68.9 mL/min (by C-G formula based on SCr of 0.86 mg/dL).   Medical History: Past Medical History:  Diagnosis Date   Cancer (HCC)    skin cancer basal   Carotid arterial disease (HCC)    a. 02/2016 L CEA; b. 01/2017 U/S: patent LICA, 1-39% RICA.   Celiac artery stenosis (HCC)    Coronary artery disease    a. 01/2016 MV: mild apical/basal inferior, apical lateral, mid anterolateral, and mid inferolateral ischemia. EF 57%; b. 02/2016 Cath: LM 40ost, LAD 70p/m, 20d, D1 95 (small), LCX nl, RCA 62m, RPDA 90 (small), EF 55-65%-->med Rx. Rec CABG for recurrent symptoms.   Hearing loss    History of echocardiogram    a. 02/2016 Echo: EF 50-55%, no rwma, mild MR.   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 Pharmacy consulted for Heparin dosing. Patient was taking Apixaban 2.5mg  bid prior to admission for afib, last dose was on 11/15 2038  Date Time HL/aPTT  Rate/Comment  11/16 1937 aPTT 49  850 u/hr; Subtherapeutic 11/17 0451 >1.1 / 104  aPTT supratherapeutic / not correlating 11/17 1348 aPTT 74  Therapeutic x1 11/17 2157 aPTT 78  Therapeutic x 2 11/18 0524 aPTT 92  Therapeutic x 3 11/19 0517 HL 0.84, aPTT 49  Subtherapeutic   Goal of Therapy:  Heparin level 0.3-0.7 units/ml aPTT 66-102 seconds Monitor platelets by anticoagulation protocol: Yes   Plan: Per RN, no interruptions in heparin infusion.  Bolus heparin 2500 units. Increase heparin infusion to 1350 units/hr Recheck aPTT daily w/ AM labs while therapeutic on heparin Use aPTT to adjust dose until correlation with HL Will check daily CBC while on Heparin infusion   Thank you for involving pharmacy in this patient's care.   Elliot Gurney, PharmD,  BCPS Clinical Pharmacist  01/21/2023 7:48 AM

## 2023-01-21 NOTE — Progress Notes (Signed)
Patient's mental status continues to improve. Given his significant fluctuations, delirium is favored to be more likely than a rapidly-progressive dementia, therefore recommend deferring further workup to the outpatient setting. No indication for LP at this time. Full progress note to follow.  Bing Neighbors, MD [facility] [phone]  If 7pm- 7am, please page neurology on call as listed in AMION.

## 2023-01-21 NOTE — TOC CM/SW Note (Signed)
Patient is not able to walk the distance required to go the bathroom, or he/she is unable to safely negotiate stairs required to access the bathroom.  A 3in1 BSC will alleviate this problem  

## 2023-01-21 NOTE — Care Management Important Message (Signed)
Important Message  Patient Details  Name: WILBURN LOTSPEICH MRN: [id number] Date of Birth: [birth year]   Important Message Given:  Yes - Medicare IM     Azaria Stegman, Stephan Minister 01/21/2023, 10:32 AM

## 2023-01-21 NOTE — Progress Notes (Signed)
Occupational Therapy Treatment Patient Details Name: Brett Riley MRN: [id number] DOB: Jul 14, 1935 Today's Date: 01/21/2023   History of present illness Pt is an 87 y.o. male with medical history significant of diastolic CHF, CAD, PAF on Eliquis, vertebral osteomyelitis, and HTN presenting with suspected CVA with dysarthria and AMS.  Imaging completed with CVA ruled out.  MD assessment includes: bilateral PNA, somnolence and encephalopathy, and subacute hematoma of the R knee.   OT comments  Mr Choksi was seen for OT treatment on this date. Upon arrival to room pt reclined in bed, agreeable to tx. Pt requires MAX A don/doff B socks at bed level. MAX A exit bed, fair initial sitting balance decreasing to MIN A as pt fatigues. Pt defers standing attempts, sitting tolerance ~3 min, agreeable to bed level therex. Completed exercises as described below. Educated pt/spouse on DME recs and HEP. Pt making good progress toward goals, will continue to follow POC. Discharge recommendation remains appropriate.       If plan is discharge home, recommend the following:  Two people to help with bathing/dressing/bathroom   Equipment Recommendations  Hospital bed    Recommendations for Other Services      Precautions / Restrictions Precautions Precautions: Fall Restrictions Weight Bearing Restrictions: Yes RLE Weight Bearing: Weight bearing as tolerated       Mobility Bed Mobility Overal bed mobility: Needs Assistance Bed Mobility: Supine to Sit, Sit to Supine     Supine to sit: Mod assist Sit to supine: Max assist        Transfers                   General transfer comment: deferred 2/2 poor sitting balance/tolerance     Balance Overall balance assessment: Needs assistance Sitting-balance support: Feet supported, Bilateral upper extremity supported Sitting balance-Leahy Scale: Poor   Postural control: Posterior lean, Right lateral lean                                  ADL either performed or assessed with clinical judgement   ADL Overall ADL's : Needs assistance/impaired                                       General ADL Comments: MAX A don/doff B socks at bed level      Cognition Arousal: Alert Behavior During Therapy: Flat affect Overall Cognitive Status: Within Functional Limits for tasks assessed                                          Exercises Exercises: General Upper Extremity, General Lower Extremity General Exercises - Upper Extremity Shoulder Flexion: AROM, Strengthening, Both, 5 reps, Supine Shoulder Extension: AROM, Strengthening, Both, 5 reps, Supine Elbow Flexion: AROM, Strengthening, Both, 5 reps, Supine Elbow Extension: AROM, Strengthening, Both, 5 reps, Supine General Exercises - Lower Extremity Quad Sets: AROM, Strengthening, Both, 5 reps, Supine Gluteal Sets: AROM, Strengthening, Both, 5 reps, Supine Short Arc Quad: AROM, Strengthening, Both, 5 reps, Supine Straight Leg Raises: AROM, Strengthening, Both, 5 reps, Supine Other Exercises Other Exercises: 5 reps of BUE assisted sit ups using bed rail            Pertinent Vitals/ Pain  Pain Assessment Pain Assessment: No/denies pain   Frequency  Min 1X/week        Progress Toward Goals  OT Goals(current goals can now be found in the care plan section)  Progress towards OT goals: Progressing toward goals  Acute Rehab OT Goals Patient Stated Goal: go home OT Goal Formulation: With patient/family Time For Goal Achievement: 01/29/23 Potential to Achieve Goals: Fair ADL Goals Pt Will Perform Grooming: sitting;with min assist Pt Will Perform Upper Body Bathing: with set-up;bed level Pt Will Transfer to Toilet: with mod assist  Plan      Co-evaluation                 AM-PAC OT "6 Clicks" Daily Activity     Outcome Measure   Help from another person eating meals?: A Little Help from another person  taking care of personal grooming?: A Little Help from another person toileting, which includes using toliet, bedpan, or urinal?: A Lot Help from another person bathing (including washing, rinsing, drying)?: A Lot Help from another person to put on and taking off regular upper body clothing?: A Lot Help from another person to put on and taking off regular lower body clothing?: A Lot 6 Click Score: 14    End of Session    OT Visit Diagnosis: Unsteadiness on feet (R26.81);Muscle weakness (generalized) (M62.81)   Activity Tolerance Patient tolerated treatment well;Patient limited by fatigue   Patient Left in bed;with call bell/phone within reach;with family/visitor present   Nurse Communication          Time: 6213-0865 OT Time Calculation (min): 18 min  Charges: OT General Charges $OT Visit: 1 Visit OT Treatments $Therapeutic Exercise: 8-22 mins  Kathie Dike, M.S. OTR/L  01/21/23, 2:32 PM  ascom [phone]

## 2023-01-21 NOTE — Progress Notes (Signed)
PULMONOLOGY         Date: 01/21/2023,   MRN# [id number] Brett Riley [birth year]     AdmissionWeight: 83.9 kg                 CurrentWeight: 83.9 kg  Referring provider: Dr Mayford Knife   CHIEF COMPLAINT:   Recurrent hospitalizations with possible vasculitis   HISTORY OF PRESENT ILLNESS   This is an 87 yo male with history of HFpEF, CAD, AF, HTN,CKD with AMS dysarthria concern for possible acute CVA. Wife reports recent onset of poor PO intake. Patient was hospitalized in August 2024 with vertebral osteo and completed IV abx per ID recommendations.  He progressively became more encephalopathic and somnolent.  Bloodwork has been done to eval hormonal/electorlyte imbalance.  PCCM consultation for possible autoimmune vasculitis.   01/21/23- patient is improved, lung sounds are clear to auscultation bilateral.  He is working with PT.  Wife at bedside we discussed care plan and discharge plan.  He is cleared for dc home.   PAST MEDICAL HISTORY   Past Medical History:  Diagnosis Date   Cancer [facility])    skin cancer basal   Carotid arterial disease (HCC)    a. 02/2016 L CEA; b. 01/2017 U/S: patent LICA, 1-39% RICA.   Celiac artery stenosis (HCC)    Coronary artery disease    a. 01/2016 MV: mild apical/basal inferior, apical lateral, mid anterolateral, and mid inferolateral ischemia. EF 57%; b. 02/2016 Cath: LM 40ost, LAD 70p/m, 20d, D1 95 (small), LCX nl, RCA 70m, RPDA 90 (small), EF 55-65%-->med Rx. Rec CABG for recurrent symptoms.   Hearing loss    History of echocardiogram    a. 02/2016 Echo: EF 50-55%, no rwma, mild MR.   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   Osteoarthritis, hip, bilateral    Left > Right     SURGICAL HISTORY   Past Surgical History:  Procedure Laterality Date   AORTIC INTERVENTION N/A 07/24/2022   Procedure: AORTIC INTERVENTION;  Surgeon: Renford Dills, MD;   Location: [facility] INVASIVE CV LAB;  Service: Cardiovascular;  Laterality: N/A;   CARDIAC CATHETERIZATION N/A 01/19/2016   Procedure: Left Heart Cath and Coronary Angiography;  Surgeon: Iran Ouch, MD;  Location: [facility] INVASIVE CV LAB;  Service: Cardiovascular;  Laterality: N/A;   COLONOSCOPY     CYSTOSCOPY/URETEROSCOPY/HOLMIUM LASER/STENT PLACEMENT Right 04/13/2021   Procedure: CYSTOSCOPY/URETEROSCOPY/HOLMIUM LASER/STENT PLACEMENT;  Surgeon: Sondra Come, MD;  Location: [facility] ORS;  Service: Urology;  Laterality: Right;   ENDARTERECTOMY Left 02/15/2016   Procedure: ENDARTERECTOMY CAROTID;  Surgeon: Annice Needy, MD;  Location: [facility] ORS;  Service: Vascular;  Laterality: Left;   RIGHT HEART CATH N/A 09/19/2022   Procedure: RIGHT HEART CATH;  Surgeon: Yvonne Kendall, MD;  Location: [facility] INVASIVE CV LAB;  Service: Cardiovascular;  Laterality: N/A;   TEE WITHOUT CARDIOVERSION N/A 08/05/2022   Procedure: TRANSESOPHAGEAL ECHOCARDIOGRAM;  Surgeon: Debbe Odea, MD;  Location: [facility] ORS;  Service: Cardiovascular;  Laterality: N/A;     FAMILY HISTORY   Family History  Problem Relation Age of Onset   Stroke Mother    Heart disease Father        MI   Heart attack Father    Breast cancer Sister    Colon cancer Neg Hx    Prostate cancer Neg Hx      SOCIAL HISTORY  Social History   Tobacco Use   Smoking status: Never   Smokeless tobacco: Never  Vaping Use   Vaping status: Never Used  Substance Use Topics   Alcohol use: No    Alcohol/week: 0.0 standard drinks of alcohol   Drug use: No     MEDICATIONS    Home Medication:    Current Medication:  Current Facility-Administered Medications:    acetaminophen (TYLENOL) tablet 650 mg, 650 mg, Oral, Q4H PRN, 650 mg at 01/20/23 1609 **OR** acetaminophen (TYLENOL) 160 MG/5ML solution 650 mg, 650 mg, Per Tube, Q4H PRN **OR** acetaminophen (TYLENOL) suppository 650 mg, 650 mg, Rectal, Q4H PRN, Floydene Flock, MD   apixaban Everlene Balls)  tablet 2.5 mg, 2.5 mg, Oral, BID, Bing Neighbors M, MD, 2.5 mg at 01/21/23 1118   clopidogrel (PLAVIX) tablet 75 mg, 75 mg, Oral, Daily, Floydene Flock, MD, 75 mg at 01/21/23 0839   diltiazem (CARDIZEM CD) 24 hr capsule 360 mg, 360 mg, Oral, Daily, Floydene Flock, MD, 360 mg at 01/21/23 0839   feeding supplement (ENSURE ENLIVE / ENSURE PLUS) liquid 237 mL, 237 mL, Oral, BID BM, Charise Killian, MD, 237 mL at 01/21/23 1030   ipratropium-albuterol (DUONEB) 0.5-2.5 (3) MG/3ML nebulizer solution 3 mL, 3 mL, Nebulization, Q6H PRN, Charise Killian, MD   labetalol (NORMODYNE) injection 10 mg, 10 mg, Intravenous, Q2H PRN, Floydene Flock, MD   lidocaine (LMX) 4 % cream, , Topical, BID PRN, Bhagat, Srishti L, MD   metoprolol succinate (TOPROL-XL) 24 hr tablet 100 mg, 100 mg, Oral, Daily, Fabienne Bruns M, MD, 100 mg at 01/21/23 5284   Oral care mouth rinse, 15 mL, Mouth Rinse, PRN, Charise Killian, MD   pantoprazole (PROTONIX) EC tablet 40 mg, 40 mg, Oral, q morning, Floydene Flock, MD, 40 mg at 01/21/23 0839   polyethylene glycol (MIRALAX / GLYCOLAX) packet 17 g, 17 g, Oral, Daily PRN, Charise Killian, MD, 17 g at 01/15/23 1314   QUEtiapine (SEROQUEL) tablet 12.5 mg, 12.5 mg, Oral, QHS PRN, Bhagat, Srishti L, MD   QUEtiapine (SEROQUEL) tablet 12.5 mg, 12.5 mg, Oral, QHS, Bhagat, Srishti L, MD, 12.5 mg at 01/20/23 2027   rosuvastatin (CRESTOR) tablet 10 mg, 10 mg, Oral, Daily, Jaynie Bream, RPH, 10 mg at 01/21/23 1324    ALLERGIES   Patient has no known allergies.     REVIEW OF SYSTEMS    Review of Systems:  Gen:  Denies  fever, sweats, chills weigh loss  HEENT: Denies blurred vision, double vision, ear pain, eye pain, hearing loss, nose bleeds, sore throat Cardiac:  No dizziness, chest pain or heaviness, chest tightness,edema Resp:   reports dyspnea chronically  Gi: Denies swallowing difficulty, stomach pain, nausea or vomiting, diarrhea, constipation, bowel  incontinence Gu:  Denies bladder incontinence, burning urine Ext:   Denies Joint pain, stiffness or swelling Skin: Denies  skin rash, easy bruising or bleeding or hives Endoc:  Denies polyuria, polydipsia , polyphagia or weight change Psych:   Denies depression, insomnia or hallucinations   Other:  All other systems negative   VS: BP (!) 139/90 (BP Location: Left Arm)   Pulse 79   Temp 98 F (36.7 C) (Oral)   Resp 14   Ht 6' (1.829 m)   Wt 83.9 kg   SpO2 94%   BMI 25.09 kg/m      PHYSICAL EXAM    GENERAL:NAD, no fevers, chills, no weakness no fatigue HEAD: Normocephalic, atraumatic.  EYES: Pupils  equal, round, reactive to light. Extraocular muscles intact. No scleral icterus.  MOUTH: Moist mucosal membrane. Dentition intact. No abscess noted.  EAR, NOSE, THROAT: Clear without exudates. No external lesions.  NECK: Supple. No thyromegaly. No nodules. No JVD.  PULMONARY: decreased breath sounds with mild rhonchi worse at bases bilaterally.  CARDIOVASCULAR: S1 and S2. Regular rate and rhythm. No murmurs, rubs, or gallops. No edema. Pedal pulses 2+ bilaterally.  GASTROINTESTINAL: Soft, nontender, nondistended. No masses. Positive bowel sounds. No hepatosplenomegaly.  MUSCULOSKELETAL: No swelling, clubbing, or edema. Range of motion full in all extremities.  NEUROLOGIC: Cranial nerves II through XII are intact. No gross focal neurological deficits. Sensation intact. Reflexes intact.  SKIN: No ulceration, lesions, rashes, or cyanosis. Skin warm and dry. Turgor intact.  PSYCHIATRIC: Mood, affect within normal limits. The patient is awake, alert and oriented x 3. Insight, judgment intact.       IMAGING     Narrative & Impression  CLINICAL DATA:  Diffuse/interstitial lung disease   EXAM: CT CHEST WITHOUT CONTRAST   TECHNIQUE: Multidetector CT imaging of the chest was performed following the standard protocol without IV contrast.   RADIATION DOSE REDUCTION: This exam was  performed according to the departmental dose-optimization program which includes automated exposure control, adjustment of the mA and/or kV according to patient size and/or use of iterative reconstruction technique.   COMPARISON:  None Available.   FINDINGS: Cardiovascular: Similar contour abnormality of the ascending thoracic aorta just proximal to the arch. Cardiomegaly. No sizable pericardial effusion. Coronary artery and aortic atherosclerosis.   Mediastinum/Nodes: No enlarged mediastinal or axillary lymph nodes. Thyroid gland, trachea, and esophagus demonstrate no significant findings.   Lungs/Pleura: Previously seen dependent bibasilar opacities have improved. Mild residual linear opacities and mild bronchiectasis in these this region. No confluent consolidation. No pneumothorax. No pleural effusions.   Upper Abdomen: No acute abnormality.   Musculoskeletal: No acute fracture.   IMPRESSION: 1. Previously seen dependent bibasilar opacities have improved. Mild residual linear opacities and mild bronchiectasis in these this region likely represents post infectious scarring. No confluent consolidation. 2. Similar contour abnormality of the ascending aorta, representing either a penetrating ulcer or aneurysm. Recommend follow-up CT angiography when clinically feasible. 3.  Aortic Atherosclerosis (ICD10-I70.0).     Electronically Signed   By: Feliberto Harts M.D.   On: 01/18/2023 12:21   ASSESSMENT/PLAN    Bilateral non cystic fibrosis bronchiectasis   - noted neurology workup with altered mental status   - plan for LP    - autoimmune serology workup while inpatient    -will continue to follow along with you   -patient empirically on rocephin and is on duoneb nebulizer soln        Thank you for allowing me to participate in the care of this patient.   Patient/Family are satisfied with care plan and all questions have been answered.    Provider  disclosure: Patient with at least one acute or chronic illness or injury that poses a threat to life or bodily function and is being managed actively during this encounter.  All of the below services have been performed independently by signing provider:  review of prior documentation from internal and or external health records.  Review of previous and current lab results.  Interview and comprehensive assessment during patient visit today. Review of current and previous chest radiographs/CT scans. Discussion of management and test interpretation with health care team and patient/family.   This document was prepared using Conservation officer, historic buildings  and may include unintentional dictation errors.     Vida Rigger, M.D.  Division of Pulmonary & Critical Care Medicine

## 2023-01-21 NOTE — Plan of Care (Signed)
  Problem: Clinical Measurements: Goal: Will remain free from infection Outcome: Progressing   Problem: Clinical Measurements: Goal: Ability to maintain clinical measurements within normal limits will improve Outcome: Progressing   Problem: Clinical Measurements: Goal: Diagnostic test results will improve Outcome: Progressing   Problem: Clinical Measurements: Goal: Respiratory complications will improve Outcome: Progressing   Problem: Clinical Measurements: Goal: Cardiovascular complication will be avoided Outcome: Progressing   Problem: Nutrition: Goal: Adequate nutrition will be maintained Outcome: Progressing   Problem: Coping: Goal: Level of anxiety will decrease Outcome: Progressing   Problem: Elimination: Goal: Will not experience complications related to bowel motility Outcome: Progressing Goal: Will not experience complications related to urinary retention Outcome: Progressing   Problem: Skin Integrity: Goal: Risk for impaired skin integrity will decrease Outcome: Progressing   Problem: Safety: Goal: Ability to remain free from injury will improve Outcome: Progressing

## 2023-01-22 DIAGNOSIS — R410 Disorientation, unspecified: Secondary | ICD-10-CM | POA: Diagnosis not present

## 2023-01-22 LAB — CBC
HCT: 25.7 % — ABNORMAL LOW (ref 39.0–52.0)
Hemoglobin: 8.5 g/dL — ABNORMAL LOW (ref 13.0–17.0)
MCH: 31.4 pg (ref 26.0–34.0)
MCHC: 33.1 g/dL (ref 30.0–36.0)
MCV: 94.8 fL (ref 80.0–100.0)
Platelets: 215 10*3/uL (ref 150–400)
RBC: 2.71 MIL/uL — ABNORMAL LOW (ref 4.22–5.81)
RDW: 15 % (ref 11.5–15.5)
WBC: 6.6 10*3/uL (ref 4.0–10.5)
nRBC: 0 % (ref 0.0–0.2)

## 2023-01-22 LAB — QUANTIFERON-TB GOLD PLUS (RQFGPL)
QuantiFERON Mitogen Value: >10 [IU]/mL
QuantiFERON Nil Value: 0.01 [IU]/mL
QuantiFERON TB1 Ag Value: 0.01 [IU]/mL
QuantiFERON TB2 Ag Value: 0.02 [IU]/mL

## 2023-01-22 LAB — QUANTIFERON-TB GOLD PLUS: QuantiFERON-TB Gold Plus: NEGATIVE

## 2023-01-22 MED ORDER — ENSURE ENLIVE PO LIQD: 237.0000 mL | Freq: Two times a day (BID) | ORAL | Status: DC

## 2023-01-22 MED ORDER — QUETIAPINE FUMARATE 25 MG PO TABS: 12.5000 mg | ORAL_TABLET | Freq: Every evening | ORAL | 0 refills | Status: AC | PRN

## 2023-01-22 MED ORDER — INFLUENZA VAC A&B SURF ANT ADJ 0.5 ML IM SUSY
0.5000 mL | PREFILLED_SYRINGE | Freq: Once | INTRAMUSCULAR | Status: AC
  Administered 2023-01-22: 0.5 mL via INTRAMUSCULAR
  Filled 2023-01-22: qty 0.5

## 2023-01-22 NOTE — Plan of Care (Signed)
  Problem: Education: Goal: Knowledge of General Education information will improve Description Including pain rating scale, medication(s)/side effects and non-pharmacologic comfort measures Outcome: Progressing   

## 2023-01-22 NOTE — Progress Notes (Signed)
Physical Therapy Treatment Patient Details Name: Brett Riley MRN: [id number] DOB: 1935-03-25 Today's Date: 01/22/2023   History of Present Illness Pt is an 87 y.o. male with medical history significant of diastolic CHF, CAD, PAF on Eliquis, vertebral osteomyelitis, and HTN presenting with suspected CVA with dysarthria and AMS.  Imaging completed with CVA ruled out.  MD assessment includes: bilateral PNA, somnolence and encephalopathy, and subacute hematoma of the R knee.    PT Comments  Pt was pleasant and motivated to participate during the session and put forth good effort throughout. Pt was able to complete bed mobility at beginning of session with +2 mod assist to come to seated EOB. Pt req +2 max assist to complete sit>supine at end of session for safe and efficient completion. Pt was able to perform transfers with +2 min assist with pull-to-stand method and maintain static stand briefly with cuing. Pt reported pain at beginning of session but no other adverse symptoms throughout session. Pt will benefit from continued PT services upon discharge to safely address deficits listed in patient problem list for decreased caregiver assistance and eventual return to PLOF.    If plan is discharge home, recommend the following: Two people to help with walking and/or transfers;A lot of help with bathing/dressing/bathroom;Assistance with cooking/housework;Assistance with feeding;Direct supervision/assist for medications management;Direct supervision/assist for financial management;Assist for transportation;Help with stairs or ramp for entrance   Can travel by private vehicle        Equipment Recommendations       Recommendations for Other Services       Precautions / Restrictions Precautions Precautions: Fall Restrictions Weight Bearing Restrictions: Yes RLE Weight Bearing: Weight bearing as tolerated     Mobility  Bed Mobility Overal bed mobility: Needs Assistance Bed Mobility: Sit  to Supine, Rolling, Sidelying to Sit Rolling: Used rails, Min assist, Mod assist, +2 for physical assistance Sidelying to sit: Mod assist, +2 for physical assistance   Sit to supine: Max assist, +2 for physical assistance   General bed mobility comments: limited use of extremities, able to follow cuing with physical assist, req extra time and cuing for sequencing and extremity placement due to weakness throughout    Transfers Overall transfer level: Needs assistance   Transfers: Sit to/from Stand Sit to Stand: Min assist, From elevated surface, +2 physical assistance           General transfer comment: used sink in room to attempt pull-to-stands x2, good initiation with diminished ability on 2nd attempt, effortful due to whole-body weakness, cuing to stand tall and use BUEs to assist    Ambulation/Gait               General Gait Details: Unable   Stairs             Wheelchair Mobility     Tilt Bed    Modified Rankin (Stroke Patients Only)       Balance Overall balance assessment: Needs assistance Sitting-balance support: Feet supported, Bilateral upper extremity supported Sitting balance-Leahy Scale: Poor Sitting balance - Comments: able to sit EOB for brief period of time without physical assist and hands at sides, CGA for safety Postural control: Posterior lean Standing balance support: Bilateral upper extremity supported, During functional activity Standing balance-Leahy Scale: Poor Standing balance comment: difficulty to maintain static stand for >30" despite +2 min-mod physical assist  Cognition Arousal: Alert Behavior During Therapy: Flat affect Overall Cognitive Status: Within Functional Limits for tasks assessed                                          Exercises      General Comments        Pertinent Vitals/Pain Pain Assessment Pain Assessment:  (reported pain is present and  fluctuates) Pain Location: R knee Pain Descriptors / Indicators: Discomfort, Sore Pain Intervention(s): Monitored during session    Home Living                          Prior Function            PT Goals (current goals can now be found in the care plan section) Acute Rehab PT Goals Patient Stated Goal: Improved strength and functional mobility PT Goal Formulation: With patient/family Time For Goal Achievement: 01/28/23 Potential to Achieve Goals: Fair Progress towards PT goals: Progressing toward goals    Frequency    Min 1X/week      PT Plan      Co-evaluation              AM-PAC PT "6 Clicks" Mobility   Outcome Measure  Help needed turning from your back to your side while in a flat bed without using bedrails?: A Lot Help needed moving from lying on your back to sitting on the side of a flat bed without using bedrails?: Total Help needed moving to and from a bed to a chair (including a wheelchair)?: Total Help needed standing up from a chair using your arms (e.g., wheelchair or bedside chair)?: Total Help needed to walk in hospital room?: Total Help needed climbing 3-5 steps with a railing? : Total 6 Click Score: 7    End of Session Equipment Utilized During Treatment: Gait belt Activity Tolerance: Patient tolerated treatment well Patient left: in bed;with call bell/phone within reach;with bed alarm set Nurse Communication: Mobility status PT Visit Diagnosis: Muscle weakness (generalized) (M62.81);Difficulty in walking, not elsewhere classified (R26.2)     Time: 1610-9604 PT Time Calculation (min) (ACUTE ONLY): 18 min  Charges:                            Rosiland Oz SPT 01/22/23, 5:09 PM

## 2023-01-22 NOTE — Progress Notes (Signed)
  PROGRESS NOTE    Brett Riley  [name] DOB: 1935-07-23 DOA: 01/14/2023 PCP: Dale Richwood, MD  114A/114A-AA  LOS: 8 days   Brief hospital course:   Assessment & Plan: Brett Riley is a 87 y.o. male with medical history significant of diastolic CHF, CAD, PAF on Eliquis, HTN presenting with suspected CVA.  History primarily from wife in the setting of dysarthria.    B/l pneumonia: as per CXR.  --completed 7 days of ceftriaxone and 3 days of azithro.   Acute metabolic encephalopathy Hypoactive delirium mental status waxes and wanes. No previous dx of dementia.  Mental status seemed to improved with sleep.  No need for LP at this time as per neuro.  --cont low-dose seroquel, per neuro   Possible fibromuscular dysplasia: as per CTA head & neck. No inpatient work-up needed as per vasc surg and pt can f/u w/ previously scheduled outpatient appointment    Hx of Vertebral osteomyelitis: completed abx course. Now with chronic lower extremity weakness. PT/OT recs home health    Chronic right knee pain: CT right knee shows Ovoid, mass-like mixed attenuation area abutting the anteromedial cortex of the distal femoral diaphysis, possible hematoma vs neoplasm, and recommends MRI. MRI right femur shows subacute hematoma. Continue w/ supportive care    CKDIIIa, ruled out CKD 2   Hx of CAD: no CP.  Continue on metoprolol, plavix, statin    Chronic diastolic CHF: appears euvolemic.  --cont Toprol   Persistent a. fib:  --cont Toprl and Cardizem --cont Eliquis   HLD: continue on statin    HTN:  --cont Toprl and Cardizem  Weakness and debility --pt has not walked in months.  Wife prefers [facility] PT   DVT prophylaxis: [id number] Code Status: DNR  Family Communication: wife updated at bedside today Level of care: Telemetry Medical Dispo:   The patient is from: home Anticipated d/c is to: home Anticipated d/c date is: tomorrow   Subjective and Interval History:  Pt was  sleepy, offered no complaints.   Objective: Vitals:   01/21/23 2050 01/22/23 0436 01/22/23 0834 01/22/23 1620  BP: (!) 147/73 (!) 141/74 (!) 141/75 (!) 142/71  Pulse: 80 86 86 73  Resp: 18 18 18 16   Temp: 98.7 F (37.1 C) 98.2 F (36.8 C) 98.2 F (36.8 C) 98.6 F (37 C)  TempSrc: Oral     SpO2: 96% 95% 94% 97%  Weight:      Height:       No intake or output data in the 24 hours ending 01/22/23 1938 Filed Weights   01/14/23 1112  Weight: 83.9 kg    Examination:   Constitutional: NAD, sleepy but arousable, oriented x3 HEENT: conjunctivae and lids normal, EOMI CV: No cyanosis.   RESP: normal respiratory effort   Data Reviewed: I have personally reviewed labs and imaging studies  Time spent: 50 minutes  Darlin Priestly, MD [facility] If 7PM-7AM, please contact night-coverage 01/22/2023, 7:38 PM

## 2023-01-22 NOTE — Progress Notes (Signed)
Neurology progress note  S: Patient is fully-oriented on my exam. Per wife he has significant fluctuations, and tends to get more confused in the evenings. No new neurologic complaints today.  O:  Vitals:   01/22/23 0436 01/22/23 0834  BP: (!) 141/74 (!) 141/75  Pulse: 86 86  Resp: 18 18  Temp: 98.2 F (36.8 C) 98.2 F (36.8 C)  SpO2: 95% 94%   Constitutional: drowsy, NAD Psych: normal affect Eyes: No scleral injection HENT: No oropharyngeal obstruction.  MSK: no major joint deformities.  Cardiovascular: Perfusing extremities well Respiratory: No grossly audible wheezing GI: Soft.  No distension. There is no tenderness.  Skin: Scattered bruising throughout as well as many very small petechial lesions   Neuro:  Mental Status: oriented x4 with appropriate attention, able to calculate change Speech: no aphasia or dysarthria Cranial Nerves: II: Visual Fields are full. Pupils are equal, round, and reactive to light.   III,IV, VI: EOMI with baseline mild right eye ptosis greater than left eye ptosis V: Facial sensation is symmetric to light touch VII: Facial movement is symmetric.  VIII: hearing is hard of hearing at baseline, improves with hearing aids though he is reluctant to wear them X: Uvula elevates symmetrically XI: Shoulder shrug is symmetric. XII: tongue is midline without atrophy or fasciculations.  Motor: Tone is normal. Bulk is normal.  No drift in the bilateral upper extremities, 5/5 throughout bilateral upper extremities within limits of overall deconditioning.  Right lower extremity is not fully antigravity at the hip (2/5) but can briefly lift the left leg antigravity (3/5 at the hip).  Pain limited at the right knee, at least 3/5 knee flexion/extension.  Left knee at least 4/5 extension and flexion.  Plantar and dorsiflexion 5/5 bilaterally Sensory: Sensation is symmetric to light touch and temperature in the arms and legs. Deep Tendon Reflexes: 3+ and symmetric  in the patellae, 2+ and symmetric brachioradialis  Cerebellar: Finger-to-nose intact bilaterally Gait:  Deferred  Data  CT angio Head and Neck with contrast(Personally reviewed): 1. No intracranial large vessel occlusion. Severe stenosis in the right supraclinoid ICA and mild stenosis in the left supraclinoid ICA. 2. Severe, near occlusive stenosis at the origin of the bilateral vertebral arteries, which has progressed from the prior exam. Additional moderate stenosis in the right V2 segment. 3. 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 4. Possible mild beading in the mid to distal left ICA, which can be seen in the setting of fibromuscular dysplasia. 5. Aortic atherosclerosis.   MRI Brain(01/14/2023) personally reviewed, agree with radiology:   1. No acute intracranial abnormality. 2. Age-related cerebral atrophy with moderate to advanced chronic microvascular ischemic disease.   MRI HEAD 01/18/2023 personally reviewed, agree with radiology:   1. Motion degraded exam [This especially limits diagnostic sensitivity of post-contrast images] 2. Single punctate acute ischemic nonhemorrhagic cortical infarct involving the high posterior left frontal cortex near the vertex. [Potentially artifact] 3. Otherwise stable brain MRI. Underlying age-related cerebral atrophy with moderate to advanced chronic microvascular ischemic disease.   MRA HEAD personally reviewed, agree with radiology:    1. Motion degraded exam. 2. Negative intracranial MRA for large vessel occlusion or other emergent finding. No hemodynamically significant or correctable stenosis. No evidence for MRA evidence for vasculitis.   MRI Lumbar spine 01/18/2023 1. Interval evolution of previously seen changes related to osteomyelitis discitis at L1-2 with progressive vertebral body height loss, intervertebral disc space narrowing, and endplate irregularity. Residual endplate enhancement at this level.  Overall, appearance is improved from prior exam. No epidural collections. 2. Progressive disc bulging with endplate spurring at L1-2 secondary to #1 above, with worsened moderate spinal stenosis at this level. 3. Additional multilevel lumbar spondylosis with resultant mild lateral recess narrowing at L2-3, L4-5, and L5-S1 as above. Mild to moderate bilateral L1, L4, and L5 foraminal stenosis.   ECHO 11/14  1. Left ventricular ejection fraction, by estimation, is 40 to 45%. The  left ventricle has mildly decreased function. The left ventricle  demonstrates global hypokinesis. There is mild left ventricular  hypertrophy. Left ventricular diastolic parameters  are consistent with Grade I diastolic dysfunction (impaired relaxation).  [Note this compares to  EF of 50 to 55%  2. Right ventricular systolic function is normal. The right ventricular  size is normal. There is normal pulmonary artery systolic pressure.   3. The mitral valve is normal in structure. Mild to moderate mitral valve  regurgitation. No evidence of mitral stenosis.   4. The aortic valve is tricuspid. Aortic valve regurgitation is not  visualized. Aortic valve sclerosis is present, with no evidence of aortic  valve stenosis.   5. The inferior vena cava is normal in size with greater than 50%  respiratory variability, suggesting right atrial pressure of 3 mmHg.   A/P: Brett Riley is a 87 y.o. man with multiple recurrent hospitalizations since mid May.  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 Initially his timecourse was felt to be concerning for possible rapidly-progressive dementia and autoimmune etiology of this was considered (please see neurology note from 11/17 by Dr. Iver Nestle for in-depth discussion of these  considerations). However his mental status has steadily improved and is nearly back to baseline with the exception of some mild sundowning in the evenings. Suspect his mental status changes are 2/2 delirium which is expected to improve after hospital discharge to his home environment.  - I have counseled his wife regarding tools for gentle redirection at home.  - I will refer patient to Dr. Sherryll Burger for further outpatient workup and mgmt of any cognitive concerns that persist after hospital discharge  Neurology will sign off but feel free to re-engage if additional neurologic concerns arise.  Bing Neighbors, MD [facility] [phone]  If 7pm- 7am, please page neurology on call as listed in [facility].

## 2023-01-22 NOTE — Progress Notes (Addendum)
Occupational Therapy Treatment Patient Details Name: Brett Riley MRN: [id number] DOB: 12/03/1935 Today's Date: 01/22/2023   History of present illness Pt is an 87 y.o. male with medical history significant of diastolic CHF, CAD, PAF on Eliquis, vertebral osteomyelitis, and HTN presenting with suspected CVA with dysarthria and AMS.  Imaging completed with CVA ruled out.  MD assessment includes: bilateral PNA, somnolence and encephalopathy, and subacute hematoma of the R knee.   OT comments  Upon arrival Pt. spouse reports that Pt. Did not sleep well last night, and is fatigued. Pt. was agreeable to UE there. Ex. This morning. Pt. worked on  multiple reps of AROM, in all shoulder ranges, followed by active resistive ROM for shoulder extension, horizontal shoulder abduction, elbow flexion, and extension. Pt. then tolerated bilateral UE strengthening with red resistive theraband for bilateral shoulder flexion, horizontal abduction, elbow flexion, and extension. Pt. Required verbal cues, tactile cues, and cues for visual demonstration of proper technique, and form. Bilateral UE strengthening was performed to increase UE strength needed to engage in ADLs, and functional mobility 2/2 weakness. Discussed UE exercise progression opportunities at home. Pt. Reports having red theraband, and theraputty. OT discharge recommendations remain appropriate.       If plan is discharge home, recommend the following:  Two people to help with bathing/dressing/bathroom   Equipment Recommendations  Hospital bed    Recommendations for Other Services      Precautions / Restrictions Precautions Precautions: Fall Restrictions Weight Bearing Restrictions: No RLE Weight Bearing: Weight bearing as tolerated       Mobility Bed Mobility               General bed mobility comments: MaxA to reposition in midline    Transfers                   General transfer comment: Deferred     Balance                                            ADL either performed or assessed with clinical judgement   ADL Overall ADL's : Needs assistance/impaired                                       General ADL Comments: MaxA LE ADLs.    Extremity/Trunk Assessment Upper Extremity Assessment Upper Extremity Assessment: Generalized weakness            Vision       Perception     Praxis      Cognition Arousal: Alert Behavior During Therapy: Flat affect Overall Cognitive Status: Within Functional Limits for tasks assessed                                          Exercises Other Exercises Other Exercises: UE ther. ex. See Clinical Impression    Shoulder Instructions       General Comments      Pertinent Vitals/ Pain       Pain Assessment Pain Assessment: No/denies pain  Home Living  Prior Functioning/Environment              Frequency  Min 1X/week        Progress Toward Goals  OT Goals(current goals can now be found in the care plan section)  Progress towards OT goals: Progressing toward goals  Acute Rehab OT Goals Patient Stated Goal: To go home OT Goal Formulation: With patient/family Potential to Achieve Goals: Fair  Plan      Co-evaluation                 AM-PAC OT "6 Clicks" Daily Activity     Outcome Measure   Help from another person eating meals?: A Little Help from another person taking care of personal grooming?: A Little Help from another person toileting, which includes using toliet, bedpan, or urinal?: A Lot Help from another person bathing (including washing, rinsing, drying)?: A Lot Help from another person to put on and taking off regular upper body clothing?: A Lot Help from another person to put on and taking off regular lower body clothing?: A Lot 6 Click Score: 14    End of Session    OT Visit Diagnosis:  Unsteadiness on feet (R26.81);Muscle weakness (generalized) (M62.81)   Activity Tolerance Patient tolerated treatment well;Patient limited by fatigue   Patient Left in bed;with call bell/phone within reach;with family/visitor present   Nurse Communication          Time: 4782-9562 OT Time Calculation (min): 30 min  Charges: OT General Charges $OT Visit: 1 Visit OT Treatments $Therapeutic Exercise: 23-37 mins  Olegario Messier, MS, OTR/L   Olegario Messier 01/22/2023, 1:41 PM

## 2023-01-23 DIAGNOSIS — G9340 Encephalopathy, unspecified: Secondary | ICD-10-CM | POA: Diagnosis not present

## 2023-01-23 NOTE — TOC Transition Note (Signed)
Transition of Care [facility]) - CM/SW Discharge Note   Patient Details  Name: Brett Riley MRN: [id number] Date of Birth: Sep 14, 1935  Transition of Care [facility]) CM/SW Contact:  Allena Katz, LCSW Phone Number: 01/23/2023, 10:23 AM   Clinical Narrative: Pt has orders to discharge home. Wife has reported that 3in1 and Hospital bed has been delivered. Sam with [facility] notified. Cheryl with [facility] notified. Medical necessity on chart. Wife notified of discharge. CSW to call [facility].     Final next level of care: Home w Home Health Services Barriers to Discharge: Barriers Resolved   Patient Goals and CMS Choice CMS Medicare.gov Compare Post Acute Care list provided to:: Patient    Discharge Placement                  Patient to be transferred to facility by: [facility] Name of family member notified: wife priscilla Patient and family notified of of transfer: 01/23/23  Discharge Plan and Services Additional resources added to the After Visit Summary for       Post Acute Care Choice: Home Health          DME Arranged: Hospital bed, 3-N-1 DME Agency: [employer] Date DME Agency Contacted: 01/21/23   Representative spoke with at DME Agency: Marthann Schiller HH Arranged: RN, PT, OT [facility] Agency: [facility] Date [facility] Agency Contacted: 01/15/23   Representative spoke with at [facility] Agency: cheryl  Social Determinants of Health (SDOH) Interventions SDOH Screenings   Food Insecurity: No Food Insecurity (01/14/2023)  Housing: Low Risk  (01/14/2023)  Transportation Needs: No Transportation Needs (01/14/2023)  Utilities: Not At Risk (01/14/2023)  Depression (PHQ2-9): Low Risk  (06/06/2022)  Financial Resource Strain: Low Risk  (08/29/2020)  Physical Activity: Insufficiently Active (08/29/2020)  Social Connections: Unknown (08/29/2020)  Stress: No Stress Concern Present (08/29/2020)  Tobacco Use: Low Risk  (01/14/2023)     Readmission Risk Interventions    01/15/2023     1:35 PM  Readmission Risk Prevention Plan  Transportation Screening Complete  Medication Review (RN Care Manager) Complete  HRI or Home Care Consult Complete  Palliative Care Screening Complete  Skilled Nursing Facility Complete

## 2023-01-23 NOTE — Discharge Summary (Signed)
Physician Discharge Summary   Brett Riley  male DOB: 03-Aug-1935  VWU:[id number]  PCP: Dale Diggins, MD  Admit date: 01/14/2023 Discharge date: 01/23/2023  Admitted From: home Disposition:  home Home Health: Yes CODE STATUS: DNR  Discharge Instructions     Ambulatory referral to Neurology   Complete by: As directed       Hospital Course:  For full details, please see H&P, progress notes, consult notes and ancillary notes.  Briefly,  Brett Riley is a 87 y.o. male with medical history significant of diastolic CHF, CAD, PAF on Eliquis, HTN presenting with slurred speech and confusion.  Acute ischemic infarct --MRI showed "Single punctate acute ischemic nonhemorrhagic cortical infarct involving the high posterior left frontal cortex near the vertex."  This is unlikely to account for pt's presenting symptoms.  Neuro was consulted and had no additional rec.  Pt is already on Eliquis, plavix and statin PTA.   Acute metabolic encephalopathy Hypoactive delirium mental status waxes and wanes. No previous dx of dementia.  Mental status seemed to improved with sleep.  No need for LP at this time as per neuro.  --started on low-dose seroquel, per neuro, with improvement.  Pt was discharged on seroquel nightly PRN.  B/l pneumonia:  as per CXR.  --completed 7 days of ceftriaxone and 3 days of azithro.   Possible fibromuscular dysplasia:  as per CTA head & neck. No inpatient work-up needed as per vasc surg and pt can f/u w/ previously scheduled outpatient appointment    Hx of Vertebral osteomyelitis:  completed abx course. Now with chronic lower extremity weakness.  Per wife, pt is now mostly bed-bound.   Chronic right knee pain:  CT right knee shows Ovoid, mass-like mixed attenuation area abutting the anteromedial cortex of the distal femoral diaphysis, possible hematoma vs neoplasm, and recommends MRI. MRI right femur shows subacute hematoma. Continue w/ supportive care     CKDIIIa, ruled out CKD 2   Hx of CAD:  no CP.  Continue on metoprolol, plavix, statin    Chronic diastolic CHF:  appears euvolemic.  --cont Toprol   Persistent a. fib:  --cont Toprl and Cardizem --cont Eliquis   HLD:  continue on statin    HTN:  --cont Toprl and Cardizem   Weakness and debility --pt has not walked in months.  Wife prefers [facility] PT   Unless noted above, medications under "STOP" list are ones pt was not taking PTA.  Discharge Diagnoses:  Principal Problem:   CVA (cerebral vascular accident) [facility]) Active Problems:   Vertebral osteomyelitis continue doxycycline until 10/24/2022 [facility])   Essential hypertension, benign   Dyslipidemia   Persistent atrial fibrillation (HCC)   Heart failure with preserved ejection fraction (HCC)   History of CAD (coronary artery disease)   CKD (chronic kidney disease), stage III (HCC)   30 Day Unplanned Readmission Risk Score    Flowsheet Row ED to Hosp-Admission (Current) from 01/14/2023 in [facility] 1C MEDICAL TELEMETRY  30 Day Unplanned Readmission Risk Score (%) 34.23 Filed at 01/23/2023 0801       This score is the patient's risk of an unplanned readmission within 30 days of being discharged (0 -100%). The score is based on dignosis, age, lab data, medications, orders, and past utilization.   Low:  0-14.9   Medium: 15-21.9   High: 22-29.9   Extreme: 30 and above         Discharge Instructions:  Allergies as of 01/23/2023  No Known Allergies      Medication List     STOP taking these medications    Acidophilus/Pectin Caps   mirtazapine 7.5 MG tablet Commonly known as: REMERON   naloxone 4 MG/0.1ML Liqd nasal spray kit Commonly known as: NARCAN   sodium chloride 0.65 % Soln nasal spray Commonly known as: OCEAN   sulfamethoxazole-trimethoprim 800-160 MG tablet Commonly known as: BACTRIM DS       TAKE these medications    acetaminophen 500 MG tablet Commonly known  as: TYLENOL Take 1,000 mg by mouth every 8 (eight) hours. For knee pain   acidophilus Caps capsule Take 1 capsule by mouth daily.   clopidogrel 75 MG tablet Commonly known as: PLAVIX TAKE 1 TABLET BY MOUTH EVERY DAY WITH BREAKFAST   diltiazem 360 MG 24 hr capsule Commonly known as: CARDIZEM CD Take 1 capsule (360 mg total) by mouth daily.   Eliquis 2.5 MG Tabs tablet Generic drug: apixaban TAKE ONE TABLET BY MOUTH TWICE DAILY (DVT PREVENTION)   feeding supplement Liqd Take 237 mLs by mouth 2 (two) times daily between meals.   furosemide 20 MG tablet Commonly known as: LASIX Take 1 tablet (20 mg total) by mouth every Monday, Wednesday, and Friday.   hydrALAZINE 10 MG tablet Commonly known as: APRESOLINE Take 10 mg by mouth 3 (three) times daily.   metoprolol succinate 100 MG 24 hr tablet Commonly known as: TOPROL-XL TAKE 1 TABLET BY MOUTH EVERY DAY WITH A MEAL   multivitamins with iron Tabs tablet Take 1 tablet by mouth daily.   pantoprazole 40 MG tablet Commonly known as: PROTONIX TAKE ONE TABLET BY MOUTH IN THE MORNING   polyethylene glycol 17 g packet Commonly known as: MIRALAX / GLYCOLAX Take 17 g by mouth daily.   QUEtiapine 25 MG tablet Commonly known as: SEROQUEL Take 0.5 tablets (12.5 mg total) by mouth at bedtime as needed (for sleep).   rosuvastatin 10 MG tablet Commonly known as: CRESTOR Take 1 tablet (10 mg total) by mouth daily.               Durable Medical Equipment  (From admission, onward)           Start     Ordered   01/21/23 0917  For home use only DME Bedside commode  Once       Question:  Patient needs a bedside commode to treat with the following condition  Answer:  Generalized weakness   01/21/23 0916   01/17/23 1215  For home use only DME Hospital bed  Once       Question Answer Comment  Length of Need Lifetime   Patient has (list medical condition): generalized weakness, cognitive decline, CHF   Bed type Semi-electric       01/17/23 1216             Follow-up Information     Dale [place], MD Follow up in 1 week(s).   Specialty: Internal Medicine Contact information: [address] [place] [address] [phone]         Vida Rigger, MD Follow up in 2 week(s).   Specialty: Pulmonary Disease Contact information: [address] [place] [address] [phone]                 No Known Allergies   The results of significant diagnostics from this hospitalization (including imaging, microbiology, ancillary and laboratory) are listed below for reference.   Consultations:   Procedures/Studies: MR  Lumbar Spine W Wo Contrast  Result Date: 01/19/2023 CLINICAL DATA:  Follow-up examination for osteomyelitis discitis, low back pain. EXAM: MRI LUMBAR SPINE WITHOUT AND WITH CONTRAST TECHNIQUE: Multiplanar and multiecho pulse sequences of the lumbar spine were obtained without and with intravenous contrast. CONTRAST:  8mL GADAVIST GADOBUTROL 1 MMOL/ML IV SOLN COMPARISON:  Previous MRI from 08/02/2022. FINDINGS: Segmentation: Standard. Lowest well-formed disc space labeled the L5-S1 level. Alignment: Trace degenerative retrolisthesis of L2 on L3. Underlying trace dextroscoliosis. Alignment otherwise normal with preservation of the normal lumbar lordosis. Vertebrae: There has been interval evolution of previously seen changes related osteomyelitis discitis at L1-2. Mildly progressive vertebral body height loss at the L1 and L2 vertebral bodies. Progressive intervertebral disc space narrowing with endplate irregularity about the L1-2 interspace. Previously seen marrow edema and enhancement within the vertebral bodies themselves has largely resolved. Residual endplate enhancement now seen on today's exam. No epidural phlegmon or other collection. Progressive disc bulge and endplate spurring posteriorly with progressive moderate spinal stenosis at this level. No  other evidence for new infection elsewhere within the lumbar spine. Reactive endplate changes about the L4-5 and L5-S1 interspaces felt to be degenerative in nature, and are similar to prior. Underlying bone marrow signal intensity diffusely heterogeneous, similar to prior. No new worrisome osseous lesions. Conus medullaris and cauda equina: Conus extends to the L1 level. Conus and cauda equina appear normal. No abnormal enhancement. Paraspinal and other soft tissues: Diffuse edema seen within the lower paraspinous soft tissues, suspected be related to overall volume status. No loculated collections. Aortic atherosclerosis. Retroaortic left renal vein noted. Disc levels: T12-L1: Mild disc bulge with right-sided endplate spurring. Mild facet hypertrophy. No spinal stenosis. Foramina remain patent. L1-2: Changes related to prior osteomyelitis discitis with intervertebral disc space narrowing, endplate irregularity, and progressive disc bulging with endplate spurring. Mild facet and ligament flavum hypertrophy. Progressive moderate spinal stenosis at this level. Mild-to-moderate bilateral L1 foraminal stenosis. L2-3: Degenerative intervertebral disc space narrowing with diffuse disc bulge, asymmetric to the right. Reactive endplate spurring. Mild facet and ligament flavum hypertrophy. Resultant mild canal with bilateral subarticular stenosis. Central canal remains adequately patent. Mild left L2 foraminal narrowing. Right neural foramen remains patent. L3-4: Degenerative intervertebral disc space narrowing with disc desiccation and diffuse disc bulge. Reactive endplate spurring. Mild facet and ligament flavum hypertrophy. No significant spinal stenosis. Mild bilateral L3 foraminal narrowing. L4-5: Degenerative disc space narrowing with diffuse disc bulge and reactive endplate spurring. Superimposed small central disc protrusion mildly indents the ventral thecal sac. Mild facet and ligament flavum hypertrophy. Resultant  mild right worse than left bilateral subarticular stenosis. Central canal remains patent. Mild to moderate bilateral L4 foraminal narrowing. L5-S1: Degenerative vertebral disc space narrowing reactive endplate spurring. Broad-based central disc protrusion mildly indents the ventral thecal sac. Mild right greater than left facet hypertrophy. Resultant mild narrowing of the right lateral recess. Central canal remains patent. Mild to moderate bilateral L5 foraminal narrowing. IMPRESSION: 1. Interval evolution of previously seen changes related to osteomyelitis discitis at L1-2 with progressive vertebral body height loss, intervertebral disc space narrowing, and endplate irregularity. Residual endplate enhancement at this level. Overall, appearance is improved from prior exam. No epidural collections. 2. Progressive disc bulging with endplate spurring at L1-2 secondary to #1 above, with worsened moderate spinal stenosis at this level. 3. Additional multilevel lumbar spondylosis with resultant mild lateral recess narrowing at L2-3, L4-5, and L5-S1 as above. Mild to moderate bilateral L1, L4, and L5 foraminal stenosis. Aortic Atherosclerosis (ICD10-I70.0). Electronically Signed  By: Rise Mu M.D.   On: 01/19/2023 05:55   MR BRAIN W WO CONTRAST  Result Date: 01/19/2023 CLINICAL DATA:  Initial evaluation for neuro deficit, stroke suspected. Vasculitis suspected. EXAM: MRI HEAD WITHOUT AND WITH CONTRAST MRA HEAD WITHOUT CONTRAST TECHNIQUE: Multiplanar, multi-echo pulse sequences of the brain and surrounding structures were acquired without and with intravenous contrast. Angiographic images of the Circle of Willis were acquired using MRA technique without intravenous contrast. CONTRAST:  8mL GADAVIST GADOBUTROL 1 MMOL/ML IV SOLN COMPARISON:  Comparison made with prior MRI from 01/14/2023. FINDINGS: MRI HEAD FINDINGS Brain: Examination moderately degraded by motion artifact, limiting assessment. Age-related  cerebral atrophy. Patchy and confluent T2/FLAIR hyperintensity involving the periventricular and deep white matter both cerebral hemispheres, consistent with chronic small vessel ischemic disease, moderate to advanced in nature. Single punctate focus of restricted diffusion seen involving the high posterior left frontal cortex near the vertex (series 8, image 46), consistent with a tiny acute ischemic infarct. No visible associated hemorrhage. No other acute or subacute ischemia. Gray-white matter differentiation otherwise maintained. No acute intracranial hemorrhage. Single punctate chronic microhemorrhage noted at the left parietal region (series 12, image 45), of doubtful significance in isolation. No mass lesion, midline shift or mass effect. Mild ventricular prominence related global parenchymal volume loss without hydrocephalus. No extra-axial fluid collection. Partially empty sella noted. No appreciable abnormal enhancement, although postcontrast sequences are markedly degraded by motion. Vascular: Major intracranial vascular flow voids are maintained. Skull and upper cervical spine: Craniocervical junction within normal limits. Bone marrow signal intensity normal. Small lipoma noted at the right forehead. No other scalp soft tissue abnormality. Sinuses/Orbits: Globes and orbital soft tissues within normal limits. Small volume layering secretions noted within the right sphenoid sinus. Paranasal sinuses are otherwise largely clear. Trace bilateral mastoid effusions, of doubtful significance. Visualized nasopharynx unremarkable. Other: None. MRA HEAD FINDINGS Anterior circulation: Examination degraded by motion artifact. Both internal carotid arteries are patent through the siphons without hemodynamically significant stenosis or other abnormality. A1 segments patent bilaterally. Left A1 hypoplastic. Normal anterior communicating artery complex. Both ACAs patent without stenosis. No M1 stenosis or occlusion.  Distal MCA branches perfused and symmetric. No beading or irregularity. Posterior circulation: Both vertebral arteries widely patent without stenosis. Left PICA patent. Right PICA not well seen. Basilar patent without stenosis. Superior cerebral arteries patent bilaterally. Both PCAs primarily supplied via the basilar. Both PCAs widely patent to their distal aspects without stenosis. No beading or irregularity. Anatomic variants: Hypoplastic left A1 segment. No intracranial aneurysm or other vascular malformation. IMPRESSION: MRI HEAD: 1. Motion degraded exam. 2. Single punctate acute ischemic nonhemorrhagic cortical infarct involving the high posterior left frontal cortex near the vertex. 3. Otherwise stable brain MRI. Underlying age-related cerebral atrophy with moderate to advanced chronic microvascular ischemic disease. MRA HEAD: 1. Motion degraded exam. 2. Negative intracranial MRA for large vessel occlusion or other emergent finding. No hemodynamically significant or correctable stenosis. No evidence for MRA evidence for vasculitis. Electronically Signed   By: Rise Mu M.D.   On: 01/19/2023 05:22   MR ANGIO HEAD WO CONTRAST  Result Date: 01/19/2023 CLINICAL DATA:  Initial evaluation for neuro deficit, stroke suspected. Vasculitis suspected. EXAM: MRI HEAD WITHOUT AND WITH CONTRAST MRA HEAD WITHOUT CONTRAST TECHNIQUE: Multiplanar, multi-echo pulse sequences of the brain and surrounding structures were acquired without and with intravenous contrast. Angiographic images of the Circle of Willis were acquired using MRA technique without intravenous contrast. CONTRAST:  8mL GADAVIST GADOBUTROL 1 MMOL/ML IV SOLN COMPARISON:  Comparison made with prior MRI from 01/14/2023. FINDINGS: MRI HEAD FINDINGS Brain: Examination moderately degraded by motion artifact, limiting assessment. Age-related cerebral atrophy. Patchy and confluent T2/FLAIR hyperintensity involving the periventricular and deep white  matter both cerebral hemispheres, consistent with chronic small vessel ischemic disease, moderate to advanced in nature. Single punctate focus of restricted diffusion seen involving the high posterior left frontal cortex near the vertex (series 8, image 46), consistent with a tiny acute ischemic infarct. No visible associated hemorrhage. No other acute or subacute ischemia. Gray-white matter differentiation otherwise maintained. No acute intracranial hemorrhage. Single punctate chronic microhemorrhage noted at the left parietal region (series 12, image 45), of doubtful significance in isolation. No mass lesion, midline shift or mass effect. Mild ventricular prominence related global parenchymal volume loss without hydrocephalus. No extra-axial fluid collection. Partially empty sella noted. No appreciable abnormal enhancement, although postcontrast sequences are markedly degraded by motion. Vascular: Major intracranial vascular flow voids are maintained. Skull and upper cervical spine: Craniocervical junction within normal limits. Bone marrow signal intensity normal. Small lipoma noted at the right forehead. No other scalp soft tissue abnormality. Sinuses/Orbits: Globes and orbital soft tissues within normal limits. Small volume layering secretions noted within the right sphenoid sinus. Paranasal sinuses are otherwise largely clear. Trace bilateral mastoid effusions, of doubtful significance. Visualized nasopharynx unremarkable. Other: None. MRA HEAD FINDINGS Anterior circulation: Examination degraded by motion artifact. Both internal carotid arteries are patent through the siphons without hemodynamically significant stenosis or other abnormality. A1 segments patent bilaterally. Left A1 hypoplastic. Normal anterior communicating artery complex. Both ACAs patent without stenosis. No M1 stenosis or occlusion. Distal MCA branches perfused and symmetric. No beading or irregularity. Posterior circulation: Both vertebral  arteries widely patent without stenosis. Left PICA patent. Right PICA not well seen. Basilar patent without stenosis. Superior cerebral arteries patent bilaterally. Both PCAs primarily supplied via the basilar. Both PCAs widely patent to their distal aspects without stenosis. No beading or irregularity. Anatomic variants: Hypoplastic left A1 segment. No intracranial aneurysm or other vascular malformation. IMPRESSION: MRI HEAD: 1. Motion degraded exam. 2. Single punctate acute ischemic nonhemorrhagic cortical infarct involving the high posterior left frontal cortex near the vertex. 3. Otherwise stable brain MRI. Underlying age-related cerebral atrophy with moderate to advanced chronic microvascular ischemic disease. MRA HEAD: 1. Motion degraded exam. 2. Negative intracranial MRA for large vessel occlusion or other emergent finding. No hemodynamically significant or correctable stenosis. No evidence for MRA evidence for vasculitis. Electronically Signed   By: Rise Mu M.D.   On: 01/19/2023 05:22   CT CHEST WO CONTRAST  Result Date: 01/18/2023 CLINICAL DATA:  Diffuse/interstitial lung disease EXAM: CT CHEST WITHOUT CONTRAST TECHNIQUE: Multidetector CT imaging of the chest was performed following the standard protocol without IV contrast. RADIATION DOSE REDUCTION: This exam was performed according to the departmental dose-optimization program which includes automated exposure control, adjustment of the mA and/or kV according to patient size and/or use of iterative reconstruction technique. COMPARISON:  None Available. FINDINGS: Cardiovascular: Similar contour abnormality of the ascending thoracic aorta just proximal to the arch. Cardiomegaly. No sizable pericardial effusion. Coronary artery and aortic atherosclerosis. Mediastinum/Nodes: No enlarged mediastinal or axillary lymph nodes. Thyroid gland, trachea, and esophagus demonstrate no significant findings. Lungs/Pleura: Previously seen dependent  bibasilar opacities have improved. Mild residual linear opacities and mild bronchiectasis in these this region. No confluent consolidation. No pneumothorax. No pleural effusions. Upper Abdomen: No acute abnormality. Musculoskeletal: No acute fracture. IMPRESSION: 1. Previously seen dependent bibasilar opacities have improved. Mild residual linear  opacities and mild bronchiectasis in these this region likely represents post infectious scarring. No confluent consolidation. 2. Similar contour abnormality of the ascending aorta, representing either a penetrating ulcer or aneurysm. Recommend follow-up CT angiography when clinically feasible. 3.  Aortic Atherosclerosis (ICD10-I70.0). Electronically Signed   By: Feliberto Harts M.D.   On: 01/18/2023 12:21   MR FEMUR RIGHT W WO CONTRAST  Result Date: 01/17/2023 CLINICAL DATA:  Bone mass or bone pain, femur, benign features on xray Ovoid, mass-like mixed attenuation area abutting the anteromedial cortex of the distal femoral diaphysis on CT Chronic knee pain. EXAM: MRI OF THE RIGHT FEMUR WITHOUT AND WITH CONTRAST TECHNIQUE: Multiplanar, multisequence MR imaging of the right femur was performed both before and after administration of intravenous contrast. CONTRAST:  8mL GADAVIST GADOBUTROL 1 MMOL/ML IV SOLN COMPARISON:  CT of the right knee 01/15/2023. Radiographs 11/27/2022, 08/27/2022 and 07/31/2022. FINDINGS: Bones/Joint/Cartilage Examination includes most of the right thigh. The right hip and knee are incompletely visualized. There is no evidence of acute fracture, dislocation or aggressive osseous lesion. As seen on recent CT, there is mild periosteal elevation and heterotopic ossification along the anteromedial aspect of the distal femoral metadiaphysis. Adjacent soft tissue findings are described below. No cortical thinning, marrow edema or abnormal osseous enhancement. Small knee joint effusion. Ligaments Not relevant for exam/indication. Muscles and Tendons As  seen on CT, there is a complex fluid collection anteromedially in the distal thigh which involves the vastus medialis muscle. This measures approximately 3.2 x 1.9 cm transverse and extends approximately 7.0 cm in length. This lesion demonstrates heterogeneous T1 and T2 hyperintensity. There is no associated abnormal enhancement. Appearance is consistent with a subacute hematoma. As above, there is subjacent heterotopic ossification and benign periosteal reaction of the distal femur. No enhancing soft tissue mass or focal muscular atrophy identified. The visualized quadriceps and patellar tendons are intact. Soft tissues As above, subacute hematoma anteromedially in the distal thigh without evidence of associated abnormal enhancement or soft tissue mass. No other fluid collections identified. Femoropopliteal atherosclerosis without evidence of high-grade stenosis or occlusion. IMPRESSION: 1. The mixed attenuation lesion seen anteromedially in the distal thigh on recent CT corresponds with a subacute hematoma involving the vastus medialis muscle. No associated abnormal enhancement or soft tissue mass. 2. Subjacent heterotopic ossification and benign periosteal reaction of the distal femur. 3. No acute osseous findings or aggressive osseous lesion. 4. Femoropopliteal atherosclerosis. Electronically Signed   By: Carey Bullocks M.D.   On: 01/17/2023 11:47   ECHOCARDIOGRAM COMPLETE  Result Date: 01/16/2023    ECHOCARDIOGRAM REPORT   Patient Name:   Brett Riley Date of Exam: 01/16/2023 Medical Rec #:  [id number]      Height:       72.0 in Accession #:    [id number]     Weight:       185.0 lb Date of Birth:  06/27/1935     BSA:          2.061 m Patient Age:    87 years       BP:           152/89 mmHg Patient Gender: M              HR:           96 bpm. Exam Location:  [facility] Procedure: 2D Echo, Cardiac Doppler and Color Doppler Indications:     TIA  History:         Patient has prior history  of Echocardiogram  examinations, most                  recent 09/02/2022. CAD, TIA and Stroke, Arrythmias:Atrial                  Fibrillation, Signs/Symptoms:Dizziness/Lightheadedness, Fatigue                  and Bacteremia; Risk Factors:Hypertension and Dyslipidemia.                  CKD.  Sonographer:     Mikki Harbor Referring Phys:  [phone] Francoise Schaumann [place] Diagnosing Phys: Julien Nordmann MD  Sonographer Comments: Technically difficult study due to poor echo windows. IMPRESSIONS  1. Left ventricular ejection fraction, by estimation, is 40 to 45%. The left ventricle has mildly decreased function. The left ventricle demonstrates global hypokinesis. There is mild left ventricular hypertrophy. Left ventricular diastolic parameters are consistent with Grade I diastolic dysfunction (impaired relaxation).  2. Right ventricular systolic function is normal. The right ventricular size is normal. There is normal pulmonary artery systolic pressure.  3. The mitral valve is normal in structure. Mild to moderate mitral valve regurgitation. No evidence of mitral stenosis.  4. The aortic valve is tricuspid. Aortic valve regurgitation is not visualized. Aortic valve sclerosis is present, with no evidence of aortic valve stenosis.  5. The inferior vena cava is normal in size with greater than 50% respiratory variability, suggesting right atrial pressure of 3 mmHg. FINDINGS  Left Ventricle: Left ventricular ejection fraction, by estimation, is 40 to 45%. The left ventricle has mildly decreased function. The left ventricle demonstrates global hypokinesis. The left ventricular internal cavity size was normal in size. There is  mild left ventricular hypertrophy. Left ventricular diastolic parameters are consistent with Grade I diastolic dysfunction (impaired relaxation). Right Ventricle: The right ventricular size is normal. No increase in right ventricular wall thickness. Right ventricular systolic function is normal. There is normal pulmonary artery  systolic pressure. The tricuspid regurgitant velocity is 1.94 m/s, and  with an assumed right atrial pressure of 8 mmHg, the estimated right ventricular systolic pressure is 23.1 mmHg. Left Atrium: Left atrial size was normal in size. Right Atrium: Right atrial size was normal in size. Pericardium: There is no evidence of pericardial effusion. Mitral Valve: The mitral valve is normal in structure. There is mild thickening of the mitral valve leaflet(s). Mild mitral annular calcification. Mild to moderate mitral valve regurgitation. No evidence of mitral valve stenosis. MV peak gradient, 5.3 mmHg. The mean mitral valve gradient is 1.0 mmHg. Tricuspid Valve: The tricuspid valve is normal in structure. Tricuspid valve regurgitation is mild . No evidence of tricuspid stenosis. Aortic Valve: The aortic valve is tricuspid. Aortic valve regurgitation is not visualized. Aortic valve sclerosis is present, with no evidence of aortic valve stenosis. Aortic valve mean gradient measures 3.0 mmHg. Aortic valve peak gradient measures 6.0  mmHg. Aortic valve area, by VTI measures 3.26 cm. Pulmonic Valve: The pulmonic valve was normal in structure. Pulmonic valve regurgitation is not visualized. No evidence of pulmonic stenosis. Aorta: The aortic root is normal in size and structure. Venous: The inferior vena cava is normal in size with greater than 50% respiratory variability, suggesting right atrial pressure of 3 mmHg. IAS/Shunts: No atrial level shunt detected by color flow Doppler.  LEFT VENTRICLE PLAX 2D LVIDd:         4.40 cm     Diastology LVIDs:  2.90 cm     LV e' medial:    5.33 cm/s LV PW:         1.40 cm     LV E/e' medial:  5.0 LV IVS:        1.40 cm     LV e' lateral:   7.18 cm/s LVOT diam:     2.10 cm     LV E/e' lateral: 3.7 LV SV:         78 LV SV Index:   38 LVOT Area:     3.46 cm  LV Volumes (MOD) LV vol d, MOD A2C: 52.1 ml LV vol d, MOD A4C: 92.7 ml LV vol s, MOD A2C: 31.2 ml LV vol s, MOD A4C: 49.7 ml LV  SV MOD A2C:     20.9 ml LV SV MOD A4C:     92.7 ml LV SV MOD BP:      34.1 ml RIGHT VENTRICLE RV Basal diam:  3.25 cm RV Mid diam:    2.80 cm RV S prime:     12.20 cm/s LEFT ATRIUM           Index        RIGHT ATRIUM           Index LA diam:      3.60 cm 1.75 cm/m   RA Area:     12.90 cm LA Vol (A2C): 51.1 ml 24.79 ml/m  RA Volume:   21.90 ml  10.63 ml/m LA Vol (A4C): 42.1 ml 20.43 ml/m  AORTIC VALVE                    PULMONIC VALVE AV Area (Vmax):    2.49 cm     PV Vmax:       1.03 m/s AV Area (Vmean):   2.58 cm     PV Peak grad:  4.2 mmHg AV Area (VTI):     3.26 cm AV Vmax:           122.00 cm/s AV Vmean:          82.000 cm/s AV VTI:            0.239 m AV Peak Grad:      6.0 mmHg AV Mean Grad:      3.0 mmHg LVOT Vmax:         87.80 cm/s LVOT Vmean:        61.100 cm/s LVOT VTI:          0.225 m LVOT/AV VTI ratio: 0.94  AORTA Ao Root diam: 3.70 cm Ao Asc diam:  3.40 cm MITRAL VALVE                TRICUSPID VALVE MV Area (PHT): 4.80 cm     TR Peak grad:   15.1 mmHg MV Area VTI:   6.28 cm     TR Vmax:        194.00 cm/s MV Peak grad:  5.3 mmHg MV Mean grad:  1.0 mmHg     SHUNTS MV Vmax:       1.15 m/s     Systemic VTI:  0.22 m MV Vmean:      51.5 cm/s    Systemic Diam: 2.10 cm MV Decel Time: 158 msec MV E velocity: 26.70 cm/s MV A velocity: 109.00 cm/s MV E/A ratio:  0.24 Julien Nordmann MD Electronically signed by Julien Nordmann MD Signature Date/Time: 01/16/2023/3:50:43 PM    Final  CT KNEE RIGHT WO CONTRAST  Result Date: 01/16/2023 CLINICAL DATA:  Chronic right knee pain EXAM: CT OF THE RIGHT KNEE WITHOUT CONTRAST TECHNIQUE: Multidetector CT imaging of the right knee was performed according to the standard protocol. Multiplanar CT image reconstructions were also generated. RADIATION DOSE REDUCTION: This exam was performed according to the departmental dose-optimization program which includes automated exposure control, adjustment of the mA and/or kV according to patient size and/or use of iterative  reconstruction technique. COMPARISON:  X-ray 08/27/2022 FINDINGS: Bones/Joint/Cartilage No acute fracture. No dislocation. Mild medial compartment joint space narrowing. Small knee joint effusion with high attenuation fluid. No fat-fluid level. There is periosteal elevation along the anteromedial aspect of the distal femoral diaphysis. No cortical erosion. No cortical thickening. No lytic or sclerotic bone lesion is identified. Ligaments Suboptimally assessed by CT. Muscles and Tendons No acute musculotendinous abnormality by CT. Generalized muscle atrophy. Soft tissues Ovoid, mass-like mixed attenuation area abutting the anteromedial cortex of the distal femoral diaphysis measuring approximately 5.5 x 2.3 x 3.9 cm (series 4, image 46). This structure overlies the site of periosteal elevation. No internal calcification. Atherosclerotic vascular calcifications are present. IMPRESSION: 1. Ovoid, mass-like mixed attenuation area abutting the anteromedial cortex of the distal femoral diaphysis measuring approximately 5.5 x 2.3 x 3.9 cm. There is underlying periosteal elevation of the adjacent femur. Appearance by CT is nonspecific and could represent a soft tissue hematoma. Neoplasm at this location is a concern and a follow-up MRI of the femur with and without IV contrast is recommended to further characterize. 2. Small knee joint effusion with high attenuation fluid, suggestive of blood products. 3. Mild medial compartment joint space narrowing. Electronically Signed   By: Duanne Guess D.O.   On: 01/16/2023 08:41   MR BRAIN WO CONTRAST  Result Date: 01/14/2023 CLINICAL DATA:  Initial evaluation for neuro deficit, stroke suspected. EXAM: MRI HEAD WITHOUT CONTRAST TECHNIQUE: Multiplanar, multiecho pulse sequences of the brain and surrounding structures were obtained without intravenous contrast. COMPARISON:  CT from earlier the same day. FINDINGS: Brain: Generalized age-related cerebral atrophy. Patchy and  confluent T2/FLAIR hyperintensity involving the periventricular and deep white matter both cerebral hemispheres, consistent with chronic small vessel ischemic disease, moderate to advanced in nature. Superimposed remote lacunar infarct noted at the right lentiform nucleus. No evidence for acute or subacute ischemia. Gray-white matter differentiation maintained. No acute or chronic intracranial blood products. No mass lesion, midline shift or mass effect. No hydrocephalus or extra-axial fluid collection. Partially empty sella noted. Vascular: Major intracranial vascular flow voids are maintained. Skull and upper cervical spine: Craniocervical junction within normal limits. Bone marrow signal intensity normal. Small lipoma noted at the right forehead. Sinuses/Orbits: Globes orbital soft tissues within normal limits. Small volume layering secretions noted within the right sphenoid sinus. Paranasal sinuses are otherwise largely clear. Trace bilateral mastoid effusions, of doubtful significance. Other: None. IMPRESSION: 1. No acute intracranial abnormality. 2. Age-related cerebral atrophy with moderate to advanced chronic microvascular ischemic disease. Electronically Signed   By: Rise Mu M.D.   On: 01/14/2023 21:55   CT ANGIO HEAD NECK W WO CM  Result Date: 01/14/2023 CLINICAL DATA:  Altered mental status, stroke suspected EXAM: CT ANGIOGRAPHY HEAD AND NECK WITH AND WITHOUT CONTRAST TECHNIQUE: Multidetector CT imaging of the head and neck was performed using the standard protocol during bolus administration of intravenous contrast. Multiplanar CT image reconstructions and MIPs were obtained to evaluate the vascular anatomy. Carotid stenosis measurements (when applicable) are obtained utilizing NASCET criteria, using  the distal internal carotid diameter as the denominator. RADIATION DOSE REDUCTION: This exam was performed according to the departmental dose-optimization program which includes automated  exposure control, adjustment of the mA and/or kV according to patient size and/or use of iterative reconstruction technique. CONTRAST:  75mL OMNIPAQUE IOHEXOL 350 MG/ML SOLN COMPARISON:  No prior CTA head available, correlation is made with 01/04/2016 CTA neck and 01/14/2023 CT head FINDINGS: CT HEAD FINDINGS For noncontrast findings, please see same day CT head. CTA NECK FINDINGS Aortic arch: The arch vessel origins are incompletely included in the field of view. In correlation with the prior CTA, there appears to be a two-vessel arch with a common origin of the brachiocephalic and left common carotid arteries. Imaged portion shows no evidence of aneurysm or dissection. Aortic atherosclerosis. 70% stenosis at the origin of the right subclavian artery (series 6, image 219). Right carotid system: No evidence of dissection, occlusion, or hemodynamically significant stenosis (greater than 50%). Atherosclerotic disease in the common carotid, at the bifurcation, and in the proximal ICA is not hemodynamically significant. Left carotid system: No evidence of dissection, occlusion, or hemodynamically significant stenosis (greater than 50%). S Atherosclerotic disease in the common carotid, at the bifurcation, and in the proximal ICA is not hemodynamically significant. Possible mild beading mid to distal left ICA (series 8, image 124). Vertebral arteries: Severe, near occlusive stenosis at the origin of the bilateral vertebral arteries, which has progressed from the prior exam. Additional moderate stenosis in the right V2 segment. No evidence of dissection. Skeleton: No acute osseous abnormality. Degenerative changes in the cervical spine. Other neck: No acute finding. Upper chest: No focal pulmonary opacity or pleural effusion. Review of the MIP images confirms the above findings CTA HEAD FINDINGS Anterior circulation: Both internal carotid arteries are patent to the termini, with mild stenosis in the left supraclinoid ICA  and severe stenosis in the right supraclinoid ICA. A1 segments patent. Normal anterior communicating artery. Anterior cerebral arteries are patent to their distal aspects without significant stenosis. No M1 stenosis or occlusion. MCA branches perfused to their distal aspects without significant stenosis. Posterior circulation: Vertebral arteries patent to the vertebrobasilar junction without significant stenosis. Posterior inferior cerebellar arteries patent proximally. Basilar patent to its distal aspect without significant stenosis. Superior cerebellar arteries patent proximally. Patent P1 segments. PCAs perfused to their distal aspects without significant stenosis. The bilateral posterior communicating arteries are not visualized. Venous sinuses: As permitted by contrast timing, patent. Anatomic variants: None significant. No evidence of aneurysm or vascular malformation. Review of the MIP images confirms the above findings IMPRESSION: 1. No intracranial large vessel occlusion. Severe stenosis in the right supraclinoid ICA and mild stenosis in the left supraclinoid ICA. 2. Severe, near occlusive stenosis at the origin of the bilateral vertebral arteries, which has progressed from the prior exam. Additional moderate stenosis in the right V2 segment. 3. 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 4. Possible mild beading in the mid to distal left ICA, which can be seen in the setting of fibromuscular dysplasia. 5. Aortic atherosclerosis. Aortic Atherosclerosis (ICD10-I70.0). Electronically Signed   By: Wiliam Ke M.D.   On: 01/14/2023 19:07   DG Chest Portable 1 View  Result Date: 01/14/2023 CLINICAL DATA:  Altered mental status. EXAM: PORTABLE CHEST 1 VIEW COMPARISON:  Chest x-ray 09/23/2022. FINDINGS: Mild streaky bibasilar opacities. No visible pleural effusions or pneumothorax. Similar cardiomediastinal silhouette. No acute bony abnormality. IMPRESSION: Mild streaky bibasilar opacities,  which could represent atelectasis, aspiration, and/or pneumonia.  Electronically Signed   By: Feliberto Harts M.D.   On: 01/14/2023 17:35   CT HEAD WO CONTRAST  Result Date: 01/14/2023 CLINICAL DATA:  Mental status change, unknown cause. EXAM: CT HEAD WITHOUT CONTRAST TECHNIQUE: Contiguous axial images were obtained from the base of the skull through the vertex without intravenous contrast. RADIATION DOSE REDUCTION: This exam was performed according to the departmental dose-optimization program which includes automated exposure control, adjustment of the mA and/or kV according to patient size and/or use of iterative reconstruction technique. COMPARISON:  Brain MRI 08/01/2022. FINDINGS: Brain: Generalized cerebral and cerebellar atrophy. Patchy and ill-defined hypoattenuation within the cerebral white matter, nonspecific but compatible with moderate chronic small vessel ischemic disease. There is no acute intracranial hemorrhage. No demarcated cortical infarct. No extra-axial fluid collection. No evidence of an intracranial mass. No midline shift. Vascular: No hyperdense vessel.  Atherosclerotic calcifications. Skull: No calvarial fracture or aggressive osseous lesion. Sinuses/Orbits: No mass or acute finding within the imaged orbits. Mild mucosal thickening within the bilateral sphenoid sinuses. Other: Incidentally noted small right forehead lipoma. Trace fluid within the bilateral mastoid air cells. IMPRESSION: 1.  No evidence of an acute intracranial abnormality. 2. Parenchymal atrophy and chronic small vessel ischemic disease. 3. Mild mucosal thickening within the bilateral sphenoid sinuses. Electronically Signed   By: Jackey Loge D.O.   On: 01/14/2023 14:54      Labs: BNP (last 3 results) Recent Labs    08/21/22 1655 09/19/22 0629 10/11/22 1939  BNP 673.3* 264.2* 331.8*   Basic Metabolic Panel: Recent Labs  Lab 01/17/23 0507 01/18/23 0336 01/19/23 0451 01/20/23 0524  NA 134* 135 137  137  K 4.0 3.3* 4.0 3.4*  CL 102 104 104 103  CO2 23 24 25 25   GLUCOSE 93 98 93 91  BUN 23 24* 19 18  CREATININE 0.87 0.96 0.85 0.86  CALCIUM 9.8 9.8 10.2 9.9   Liver Function Tests: No results for input(s): "AST", "ALT", "ALKPHOS", "BILITOT", "PROT", "ALBUMIN" in the last 168 hours. No results for input(s): "LIPASE", "AMYLASE" in the last 168 hours. No results for input(s): "AMMONIA" in the last 168 hours. CBC: Recent Labs  Lab 01/18/23 0336 01/19/23 0451 01/20/23 0524 01/21/23 0517 01/22/23 0459  WBC 7.4 6.3 6.5 6.1 6.6  HGB 9.3* 9.6* 9.7* 9.1* 8.5*  HCT 27.9* 29.3* 29.2* 27.5* 25.7*  MCV 95.2 96.7 94.2 96.2 94.8  PLT 197 204 235 223 215   Cardiac Enzymes: Recent Labs  Lab 01/18/23 1548  CKTOTAL 25*   BNP: Invalid input(s): "POCBNP" CBG: Recent Labs  Lab 01/18/23 1607  GLUCAP 107*   D-Dimer No results for input(s): "DDIMER" in the last 72 hours. Hgb A1c No results for input(s): "HGBA1C" in the last 72 hours. Lipid Profile No results for input(s): "CHOL", "HDL", "LDLCALC", "TRIG", "CHOLHDL", "LDLDIRECT" in the last 72 hours. Thyroid function studies No results for input(s): "TSH", "T4TOTAL", "T3FREE", "THYROIDAB" in the last 72 hours.  Invalid input(s): "FREET3" Anemia work up No results for input(s): "VITAMINB12", "FOLATE", "FERRITIN", "TIBC", "IRON", "RETICCTPCT" in the last 72 hours. Urinalysis    Component Value Date/Time   COLORURINE YELLOW (A) 01/14/2023 1404   APPEARANCEUR CLEAR (A) 01/14/2023 1404   LABSPEC 1.019 01/14/2023 1404   PHURINE 5.0 01/14/2023 1404   GLUCOSEU NEGATIVE 01/14/2023 1404   HGBUR SMALL (A) 01/14/2023 1404   BILIRUBINUR NEGATIVE 01/14/2023 1404   KETONESUR NEGATIVE 01/14/2023 1404   PROTEINUR NEGATIVE 01/14/2023 1404   NITRITE NEGATIVE 01/14/2023 1404   LEUKOCYTESUR NEGATIVE 01/14/2023  1404   Sepsis Labs Recent Labs  Lab 01/19/23 0451 01/20/23 0524 01/21/23 0517 01/22/23 0459  WBC 6.3 6.5 6.1 6.6    Microbiology No results found for this or any previous visit (from the past 240 hour(s)).   Total time spend on discharging this patient, including the last patient exam, discussing the hospital stay, instructions for ongoing care as it relates to all pertinent caregivers, as well as preparing the medical discharge records, prescriptions, and/or referrals as applicable, is 40 minutes.    Darlin Priestly, MD  [facility] 01/23/2023, 8:39 AM

## 2023-01-24 ENCOUNTER — Telehealth: Payer: Self-pay

## 2023-01-24 LAB — UPEP/UIFE/LIGHT CHAINS/TP, 24-HR UR
% BETA, Urine: 26.6 %
ALPHA 1 URINE: 3.4 %
Albumin, U: 44.3 %
Alpha 2, Urine: 14.8 %
Free Kappa Lt Chains,Ur: 53.28 mg/L (ref 1.17–86.46)
Free Kappa/Lambda Ratio: 7.12 (ref 1.83–14.26)
Free Lambda Lt Chains,Ur: 7.48 mg/L (ref 0.27–15.21)
GAMMA GLOBULIN URINE: 10.9 %
Total Protein, Urine-Ur/day: 88 mg/(24.h) (ref 30–150)
Total Protein, Urine: 18.5 mg/dL
Total Volume: 475

## 2023-01-24 NOTE — Patient Outreach (Signed)
Care Management  Transitions of Care Program Transitions of Care Post-discharge Initial   01/24/2023 Name: Brett Riley MRN: [id number] DOB: 01-13-1936  Subjective: Brett Riley is a 87 y.o. year old male who is a primary care patient of Dale Stanley, MD. The Care Management team Engaged with patient Engaged with patient by telephone to assess and address transitions of care needs.   Consent to Services:  Patient was given information about care management services, agreed to services, and gave verbal consent to participate.   Assessment:  Date of Discharge: 01/23/23 Discharge Facility: [facility]) Type of Discharge: Inpatient Admission Primary Inpatient Discharge Diagnosis:: CVA  SDOH Interventions    Flowsheet Row Telephone from 01/24/2023 in  POPULATION HEALTH DEPARTMENT  SDOH Interventions   Food Insecurity Interventions Intervention Not Indicated  Housing Interventions Intervention Not Indicated  Transportation Interventions Intervention Not Indicated  Utilities Interventions Intervention Not Indicated        Goals Addressed             This Visit's Progress    Patient Stated       Current Barriers:  Knowledge Deficits related to plan of care for management of HTN  Chronic Disease Management support and education needs related to HTN   RNCM Clinical Goal(s):  Patient will work with the Care Management team over the next 30 days to address Transition of Care Barriers: Medication Management Diet/Nutrition/Food Resources Support at home Provider appointments Home Health services Equipment/DME Functional/Safety verbalize basic understanding of  HTN disease process and self health management plan as evidenced by No hospital readmissions for the next 30 days  through collaboration with RN Care manager, provider, and care team.   Interventions: Evaluation of current treatment plan related to  self management and  patient's adherence to plan as established by provider   Hypertension Interventions:  (Status:  New goal.) Short Term Goal Last practice recorded BP readings:  BP Readings from Last 3 Encounters:  01/23/23 (!) 168/75  11/27/22 (!) 146/78  10/15/22 129/75   Most recent eGFR/CrCl:  Lab Results  Component Value Date   EGFR 48.0 08/12/2022    No components found for: "CRCL"  Evaluation of current treatment plan related to hypertension self management and patient's adherence to plan as established by provider Provided education to patient re: stroke prevention, s/s of heart attack and stroke Reviewed medications with patient and discussed importance of compliance Discussed plans with patient for ongoing care management follow up and provided patient with direct contact information for care management team Assessed social determinant of health barriers  Patient Goals/Self-Care Activities: Participate in Transition of Care Program/Attend [facility] scheduled calls Notify RN Care Manager of [facility] call rescheduling needs Take all medications as prescribed Attend all scheduled provider appointments check blood pressure 3 times per week learn about high blood pressure call doctor for signs and symptoms of high blood pressure keep all doctor appointments take medications for blood pressure exactly as prescribed report new symptoms to your doctor  Follow Up Plan:  Telephone follow up appointment with care management team member scheduled for:  Friday November 29th, 2024 at [employer] completed for [facility] program. Interview with the patient's wife. The patient is now non-ambulatory and in a hospital bed. The goal for the patient is to stand and walk. He has private care aids that come for four hours in the morning and three hours at night. He needs help  with bathing and dressing. He is continent. He uses a urinal for voiding and bed pan for bowel movements. New medications reviewed with the  spouse. Educated her about the blood thinners and side effects. She voiced understanding. The patient will be enrolled in the [facility] 30 day Outreach service. Reviewed goals for care. Please refer to Care Plan for goals and interventions.  Effectiveness of interventions, symptom management and outcomes will be evaluated weekly. Patient educated on red flags s/s to watch for and was encouraged to report any of these identified, any new symptoms, changes in baseline or medication regimen, change in health status / well-being, or safety concerns to PCP and / or the [facility] Case Management team. The patient has been provided with contact information for the care management team and has been advised to call with any health-related questions or concerns. The patient verbalized understanding with current POC. The patient is directed to their insurance card regarding availability of benefits coverage  Deidre Ala, RN RN Care Manager [facility]Population [facility] [phone]

## 2023-01-26 LAB — MULTIPLE MYELOMA PANEL, SERUM
Albumin SerPl Elph-Mcnc: 3.3 g/dL (ref 2.9–4.4)
Albumin/Glob SerPl: 1.5 (ref 0.7–1.7)
Alpha 1: 0.2 g/dL (ref 0.0–0.4)
Alpha2 Glob SerPl Elph-Mcnc: 0.9 g/dL (ref 0.4–1.0)
B-Globulin SerPl Elph-Mcnc: 0.8 g/dL (ref 0.7–1.3)
Gamma Glob SerPl Elph-Mcnc: 0.4 g/dL (ref 0.4–1.8)
Globulin, Total: 2.3 g/dL (ref 2.2–3.9)
IgA: 164 mg/dL (ref 61–437)
IgG (Immunoglobin G), Serum: 675 mg/dL (ref 603–1613)
IgM (Immunoglobulin M), Srm: 24 mg/dL (ref 15–143)
Total Protein ELP: 5.6 g/dL — ABNORMAL LOW (ref 6.0–8.5)

## 2023-01-27 ENCOUNTER — Telehealth: Payer: Self-pay

## 2023-01-27 LAB — CRYOGLOBULIN

## 2023-01-27 NOTE — Telephone Encounter (Signed)
Doing ok. Will keep virtual.

## 2023-01-27 NOTE — Telephone Encounter (Signed)
Ok to keep appt as a virtual?

## 2023-01-27 NOTE — Telephone Encounter (Signed)
Patient's wife, Brett Riley, called to schedule a hospital follow-up for patient.  Gershon Cull asked if this can be a virtual visit.  I spoke with Rita Ohara, LPN, and scheduled patient's appointment on 02/06/2023 as a virtual visit.  Bethann Berkshire states she will check with Dr. Dale [place] and let us know if it needs to be changed.

## 2023-01-27 NOTE — Telephone Encounter (Signed)
He was readmitted to the hospital. Please confirm doing ok.

## 2023-01-28 LAB — MISC LABCORP TEST (SEND OUT): Labcorp test code: 520180

## 2023-01-29 ENCOUNTER — Telehealth: Payer: Self-pay

## 2023-01-29 MED ORDER — DILTIAZEM HCL ER COATED BEADS 360 MG PO CP24: 360.0000 mg | ORAL_CAPSULE | Freq: Every day | ORAL | 0 refills | Status: DC

## 2023-01-29 MED ORDER — HYDRALAZINE HCL 10 MG PO TABS: 10.0000 mg | ORAL_TABLET | Freq: Three times a day (TID) | ORAL | 0 refills | Status: DC

## 2023-01-29 NOTE — Telephone Encounter (Signed)
Ok

## 2023-01-29 NOTE — Telephone Encounter (Signed)
Medication refilled

## 2023-01-29 NOTE — Telephone Encounter (Signed)
Patient's wife, Antoan Lagunes, states patient recently had rehab at [facility] and they prescribed two medications.  Gershon Cull states and they have no refills.  Gershon Cull states she would like to know if Dr. Dale Le Grand can refill them for patient, if they need to be refilled.    Gershon Cull states the two medications are diltiazem HCI ER 360 MG - one tablet in the morning, and hydralazine 10 MG - one tablet by mouth three times per day.

## 2023-01-29 NOTE — Telephone Encounter (Signed)
Ok to refill 

## 2023-01-31 ENCOUNTER — Other Ambulatory Visit: Payer: Self-pay

## 2023-01-31 ENCOUNTER — Telehealth: Payer: Self-pay

## 2023-01-31 NOTE — Patient Instructions (Signed)
Visit Information  Thank you for taking time to visit with me today. Please don't hesitate to contact me if I can be of assistance to you before our next scheduled telephone appointment.  Following is a copy of your care plan:   Goals Addressed             This Visit's Progress    TOC Plan of Care       Current Barriers:  Knowledge Deficits related to plan of care for management of HTN  Chronic Disease Management support and education needs related to HTN  Cognitive Deficits  RNCM Clinical Goal(s):  Patient will work with the Care Management team over the next 30 days to address Transition of Care Barriers: Medication Management Diet/Nutrition/Food Resources Support at home Provider appointments Home Health services Equipment/DME Functional/Safety verbalize basic understanding of  HTN disease process and self health management plan as evidenced by No hospital readmissions for the next 30 days  through collaboration with RN Care manager, provider, and care team.   Interventions: Evaluation of current treatment plan related to  self management and patient's adherence to plan as established by provider   Hypertension Interventions:  (Status:  New goal.) Short Term Goal Last practice recorded BP readings:  BP Readings from Last 3 Encounters:  01/23/23 (!) 168/75  11/27/22 (!) 146/78  10/15/22 129/75   Most recent eGFR/CrCl:  Lab Results  Component Value Date   EGFR 48.0 08/12/2022    No components found for: "CRCL"  Evaluation of current treatment plan related to hypertension self management and patient's adherence to plan as established by provider Provided education to patient re: stroke prevention, s/s of heart attack and stroke Reviewed medications with patient and discussed importance of compliance Discussed plans with patient for ongoing care management follow up and provided patient with direct contact information for care management team Screening for signs and  symptoms of depression related to chronic disease state  Assessed social determinant of health barriers  Patient Goals/Self-Care Activities: Participate in Transition of Care Program/Attend [facility] scheduled calls Notify RN Care Manager of TOC call rescheduling needs Take all medications as prescribed Attend all scheduled provider appointments check blood pressure 3 times per week learn about high blood pressure call doctor for signs and symptoms of high blood pressure keep all doctor appointments take medications for blood pressure exactly as prescribed report new symptoms to your doctor  Follow Up Plan:  Telephone follow up appointment with care management team member scheduled for:  Thursday December 5th at 10am          Patient verbalizes understanding of instructions and care plan provided today and agrees to view in [place]. Active MyChart status and patient understanding of how to access instructions and care plan via MyChart confirmed with patient.     Telephone follow up appointment with care management team member scheduled for: Thursday December 5th at 10:00am  Please call the care guide team at [phone] if you need to cancel or reschedule your appointment.   Please call the Suicide and Crisis Lifeline: 988 call the [place] National Suicide Prevention Lifeline: 307 597 2877 or TTY: 678-302-7250 TTY (763)565-6811) to talk to a trained counselor if you are experiencing a Mental Health or Behavioral Health Crisis or need someone to talk to.  Deidre Ala, RN Medical illustrator [facility] [phone]

## 2023-01-31 NOTE — Patient Outreach (Signed)
  Care Management  Transitions of Care Program Transitions of Care Post-discharge week 2   01/31/2023 Name: Brett Riley MRN: [id number] DOB: 01-29-1936  Subjective: [name] is a 87 y.o. year old male who is a primary care patient of Dale Holbrook, MD. The Care Management team Engaged with patient Engaged with patient by telephone to assess and address transitions of care needs.   Consent to Services:  Patient was given information about care management services, agreed to services, and gave verbal consent to participate.   Assessment:           SDOH Interventions    Flowsheet Row Telephone from 01/24/2023 in [place] POPULATION HEALTH DEPARTMENT  SDOH Interventions   Food Insecurity Interventions Intervention Not Indicated  Housing Interventions Intervention Not Indicated  Transportation Interventions Intervention Not Indicated  Utilities Interventions Intervention Not Indicated        Goals Addressed   None    duplicate

## 2023-01-31 NOTE — Patient Outreach (Signed)
Care Management  Transitions of Care Program Transitions of Care Post-discharge week 2   01/31/2023 Name: Brett Riley MRN: [id number] DOB: 1935/07/03  Subjective: Brett Riley is a 86 y.o. year old male who is a primary care patient of Dale Roosevelt, MD. The Care Management team Engaged with patient Engaged with patient by telephone to assess and address transitions of care needs.   Consent to Services:  Patient was given information about care management services, agreed to services, and gave verbal consent to participate.   Assessment: Outreach to the patient today. Spoke with the patient's spouse due to cognition and hearing. The patient continues to be bed bound because HHPT has not been to the home to assess his mobility status. He is doing exercises in the bed for upper extremity strength and moves his legs. He continues to use the bedpan and urinal. No skin breakdown. There are caregivers to help with ADL's and to help pull him up in the bed. [facility] PCP appointment is scheduled. [facility] is the home care agency who accepted the referral but an initial appointment has not been scheduled. RNCM contacted the agency at [phone] to coordinate [facility], PT and OT. Medications reviewed and the patient is compliant.    SDOH Interventions    Flowsheet Row Telephone from 01/31/2023 in [place] POPULATION HEALTH DEPARTMENT Telephone from 01/24/2023 in [place] POPULATION HEALTH DEPARTMENT  SDOH Interventions    Food Insecurity Interventions Intervention Not Indicated Intervention Not Indicated  Housing Interventions Intervention Not Indicated Intervention Not Indicated  Transportation Interventions Intervention Not Indicated Intervention Not Indicated  Utilities Interventions Intervention Not Indicated Intervention Not Indicated        Goals Addressed             This Visit's Progress    TOC Plan of Care       Current Barriers:  Knowledge Deficits related to plan of  care for management of HTN  Chronic Disease Management support and education needs related to HTN  Cognitive Deficits  RNCM Clinical Goal(s):  Patient will work with the Care Management team over the next 30 days to address Transition of Care Barriers: Medication Management Diet/Nutrition/Food Resources Support at home Provider appointments Home Health services Equipment/DME Functional/Safety verbalize basic understanding of  HTN disease process and self health management plan as evidenced by No hospital readmissions for the next 30 days  through collaboration with RN Care manager, provider, and care team.   Interventions: Evaluation of current treatment plan related to  self management and patient's adherence to plan as established by provider   Hypertension Interventions:  (Status:  New goal.) Short Term Goal Last practice recorded BP readings:  BP Readings from Last 3 Encounters:  01/23/23 (!) 168/75  11/27/22 (!) 146/78  10/15/22 129/75   Most recent eGFR/CrCl:  Lab Results  Component Value Date   EGFR 48.0 08/12/2022    No components found for: "CRCL"  Evaluation of current treatment plan related to hypertension self management and patient's adherence to plan as established by provider Provided education to patient re: stroke prevention, s/s of heart attack and stroke Reviewed medications with patient and discussed importance of compliance Discussed plans with patient for ongoing care management follow up and provided patient with direct contact information for care management team Screening for signs and symptoms of depression related to chronic disease state  Assessed social determinant of health barriers  Patient Goals/Self-Care Activities: Participate in Transition of Care Program/Attend [facility] scheduled calls Notify RN Care  Manager of TOC call rescheduling needs Take all medications as prescribed Attend all scheduled provider appointments check blood pressure 3 times  per week learn about high blood pressure call doctor for signs and symptoms of high blood pressure keep all doctor appointments take medications for blood pressure exactly as prescribed report new symptoms to your doctor  Follow Up Plan:  Telephone follow up appointment with care management team member scheduled for:  Thursday December 5th at 10am         Reviewed goals for care. Please refer to Care Plan for goals and interventions.  Effectiveness of interventions, symptom management and outcomes will be evaluated weekly. Patient educated on red flags s/s to watch for and was encouraged to report any of these identified, any new symptoms, changes in baseline or medication regimen, change in health status / well-being, or safety concerns to PCP and / or the [facility] Case Management team.   Routine follow-up and on-going assessment evaluation and education of disease processes, recommended interventions for both chronic and acute medical conditions, will occur during each weekly visit during [facility] 30-day Program Outreach calls along with ongoing review of symptoms, medication reviews and reconciliation. Any updates, inconsistencies, discrepancies or acute care concerns will be addressed on the Care Plan and routed to the correct Practitioner if indicated.    The patient has been provided with contact information for the care management team and has been advised to call with any health-related questions or concerns. The patient verbalized understanding with current POC. The patient is directed to their insurance card regarding availability of benefits coverage.  Deidre Ala, RN Medical illustrator [facility]Population [facility] [phone]

## 2023-02-03 ENCOUNTER — Telehealth: Payer: Self-pay

## 2023-02-03 LAB — MISC LABCORP TEST (SEND OUT): Labcorp test code: 92116

## 2023-02-03 NOTE — Telephone Encounter (Signed)
[name] called from [facility] to follow-up on orders they requested for patient.  Lamar Laundry states they are orders for wound care (two of these) and physical therapy assessment.  Lamar Laundry states they were faxed to [place] on 01/24/2023.

## 2023-02-03 NOTE — Telephone Encounter (Signed)
Orders faxed

## 2023-02-04 ENCOUNTER — Telehealth: Payer: Self-pay | Admitting: Internal Medicine

## 2023-02-04 NOTE — Telephone Encounter (Signed)
Pt's wife, Gershon Cull, called requesting to speak to Rosann Auerbach before the pt's appt on Thurs, 12/5, with Dr. Lorin Picket. Gershon Cull states the pt's behavior isn't the same & he isn't acting like his normal self. Gershon Cull states the pt is confused about what he's doing & where he is. Please advise. Call back # [phone].

## 2023-02-05 NOTE — Telephone Encounter (Signed)
Patient's wife, Kriday Semper, called to follow-up on her previous message.  Gershon Cull states patient is confused and he sleeps most of the time.  Gershon Cull states she wants Dr. Dale Friendly to observe patient without her interferring.  Gershon Cull states she recently got up in the middle of the night and noticed that patient, who was sleeping in a hospital bed in another room, had taken all of his covers off and they were in the floor and was trying to get up.  Gershon Cull states he had moved around enough that his brief was almost off.  Gershon Cull states patient has not stood up since May with the exception of when he was in the hospital, and physical therapist was able to work with him and patient stood for 30 seconds.  Gershon Cull states patient has also not been eating well.

## 2023-02-05 NOTE — Telephone Encounter (Signed)
FYI- Talked to [place]. Confirmed nothing new or acute going on. These are just episodes that she wanted you to be aware of prior to appt tomorrow. He is getting confused and some times restless at night- trying to get out of bed, talking about things that he would do on a normal day at work (getting a microwave down off the shelf for someone). Has appt with you tomorrow. Wife says that overall he looks great, he just is not the same person as he was prior to all this.

## 2023-02-05 NOTE — Telephone Encounter (Signed)
Appt scheduled tomorrow

## 2023-02-06 ENCOUNTER — Other Ambulatory Visit: Payer: Self-pay

## 2023-02-06 ENCOUNTER — Telehealth: Payer: Medicare HMO | Admitting: Internal Medicine

## 2023-02-06 DIAGNOSIS — R0902 Hypoxemia: Secondary | ICD-10-CM | POA: Diagnosis not present

## 2023-02-06 DIAGNOSIS — D696 Thrombocytopenia, unspecified: Secondary | ICD-10-CM

## 2023-02-06 DIAGNOSIS — M25461 Effusion, right knee: Secondary | ICD-10-CM | POA: Diagnosis not present

## 2023-02-06 DIAGNOSIS — I2510 Atherosclerotic heart disease of native coronary artery without angina pectoris: Secondary | ICD-10-CM | POA: Diagnosis not present

## 2023-02-06 DIAGNOSIS — I5030 Unspecified diastolic (congestive) heart failure: Secondary | ICD-10-CM | POA: Diagnosis not present

## 2023-02-06 DIAGNOSIS — I779 Disorder of arteries and arterioles, unspecified: Secondary | ICD-10-CM

## 2023-02-06 DIAGNOSIS — I4819 Other persistent atrial fibrillation: Secondary | ICD-10-CM

## 2023-02-06 DIAGNOSIS — I509 Heart failure, unspecified: Secondary | ICD-10-CM | POA: Diagnosis not present

## 2023-02-06 DIAGNOSIS — I10 Essential (primary) hypertension: Secondary | ICD-10-CM

## 2023-02-06 DIAGNOSIS — I723 Aneurysm of iliac artery: Secondary | ICD-10-CM

## 2023-02-06 DIAGNOSIS — H269 Unspecified cataract: Secondary | ICD-10-CM | POA: Diagnosis not present

## 2023-02-06 DIAGNOSIS — I700 Atherosclerosis of aorta: Secondary | ICD-10-CM

## 2023-02-06 DIAGNOSIS — R531 Weakness: Secondary | ICD-10-CM

## 2023-02-06 DIAGNOSIS — I110 Hypertensive heart disease with heart failure: Secondary | ICD-10-CM | POA: Diagnosis not present

## 2023-02-06 DIAGNOSIS — G4700 Insomnia, unspecified: Secondary | ICD-10-CM | POA: Diagnosis not present

## 2023-02-06 DIAGNOSIS — E785 Hyperlipidemia, unspecified: Secondary | ICD-10-CM

## 2023-02-06 DIAGNOSIS — I4891 Unspecified atrial fibrillation: Secondary | ICD-10-CM | POA: Diagnosis not present

## 2023-02-06 DIAGNOSIS — M1990 Unspecified osteoarthritis, unspecified site: Secondary | ICD-10-CM | POA: Diagnosis not present

## 2023-02-06 DIAGNOSIS — G479 Sleep disorder, unspecified: Secondary | ICD-10-CM

## 2023-02-06 DIAGNOSIS — I719 Aortic aneurysm of unspecified site, without rupture: Secondary | ICD-10-CM

## 2023-02-06 DIAGNOSIS — K219 Gastro-esophageal reflux disease without esophagitis: Secondary | ICD-10-CM | POA: Diagnosis not present

## 2023-02-06 DIAGNOSIS — D6869 Other thrombophilia: Secondary | ICD-10-CM | POA: Diagnosis not present

## 2023-02-06 DIAGNOSIS — R2681 Unsteadiness on feet: Secondary | ICD-10-CM | POA: Diagnosis not present

## 2023-02-06 NOTE — Patient Outreach (Signed)
  Care Management  Transitions of Care Program Transitions of Care Post-discharge week 3  02/06/2023 Name: DONIVAN BURAU MRN: [id number] DOB: 1935-12-21  Subjective: Brett Riley is a 87 y.o. year old male who is a primary care patient of Dale Milligan, MD. The Care Management team was unable to reach the patient by phone to assess and address transitions of care needs.   Plan: Additional outreach attempts will be made to reach the patient enrolled in the [facility] Program (Post Inpatient/ED Visit).  Deidre Ala, RN Medical illustrator [facility]Population [facility] [phone]

## 2023-02-06 NOTE — Progress Notes (Addendum)
Patient ID: Brett Riley, male   DOB: 05-08-1935, 87 y.o.   MRN: [id number]   Virtual Visit via video Note  I connected with Brett Riley by a video enabled telemedicine application and verified that I am speaking with the correct person using two identifiers. Location patient: home Location provider: work  Persons participating in the virtual visit: patient, provider and pts wife - Melique Fuhrmann.   The limitations, risks, security and privacy concerns of performing an evaluation and management service by video and the availability of in person appointments have been discussed. It has also been discussed with the patient that there may be a patient responsible charge related to this service. The patient expressed understanding and agreed to proceed.   Reason for visit: hospital follow up  HPI: Admitted 01/14/23 - 01/23/23 - after presenting with slurred speech and confusion. MRI showed "Single punctate acute ischemic nonhemorrhagic cortical infarct involving the high posterior left frontal cortex near the vertex."  Neurology felt -  unlikely to account for pt's presenting symptoms.  Neuro was consulted and had no additional rec. Continues on eliquis, plavix and statin. Started on low dose seroquel. Was treated for pneumonia with ceftriaxone and azithromycin. MRI right femur shows subacute hematoma. Evaluated and recommended HH PT. OT evaluated yesterday. PT to come today. He denies any pain. Reports some increased confusion. Having vivid dreams. Appears to be sleeping during the day, which is messing up night time sleep. His dreams seem very real to him and this will cause confusion - of where he is, etc - until reoriented.  Wife is doing a good job or trying to keep him oriented. Unable to get out of bed. Is used to being very active. No increased cough or congestion.  Just started on seroquel.  Will hold on increasing the dose.   ROS: See pertinent positives and negatives per HPI.  Past  Medical History:  Diagnosis Date   Cancer [facility])    skin cancer basal   Carotid arterial disease (HCC)    a. 02/2016 L CEA; b. 01/2017 U/S: patent LICA, 1-39% RICA.   Celiac artery stenosis (HCC)    Coronary artery disease    a. 01/2016 MV: mild apical/basal inferior, apical lateral, mid anterolateral, and mid inferolateral ischemia. EF 57%; b. 02/2016 Cath: LM 40ost, LAD 70p/m, 20d, D1 95 (small), LCX nl, RCA 103m, RPDA 90 (small), EF 55-65%-->med Rx. Rec CABG for recurrent symptoms.   Hearing loss    History of echocardiogram    a. 02/2016 Echo: EF 50-55%, no rwma, mild MR.   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   Osteoarthritis, hip, bilateral    Left > Right    Past Surgical History:  Procedure Laterality Date   AORTIC INTERVENTION N/A 07/24/2022   Procedure: AORTIC INTERVENTION;  Surgeon: Renford Dills, MD;  Location: [facility] INVASIVE CV LAB;  Service: Cardiovascular;  Laterality: N/A;   CARDIAC CATHETERIZATION N/A 01/19/2016   Procedure: Left Heart Cath and Coronary Angiography;  Surgeon: Iran Ouch, MD;  Location: [facility] INVASIVE CV LAB;  Service: Cardiovascular;  Laterality: N/A;   COLONOSCOPY     CYSTOSCOPY/URETEROSCOPY/HOLMIUM LASER/STENT PLACEMENT Right 04/13/2021   Procedure: CYSTOSCOPY/URETEROSCOPY/HOLMIUM LASER/STENT PLACEMENT;  Surgeon: Sondra Come, MD;  Location: [facility] ORS;  Service: Urology;  Laterality: Right;   ENDARTERECTOMY Left 02/15/2016   Procedure: ENDARTERECTOMY CAROTID;  Surgeon: Annice Needy, MD;  Location:  [facility] ORS;  Service: Vascular;  Laterality: Left;   RIGHT HEART CATH N/A 09/19/2022   Procedure: RIGHT HEART CATH;  Surgeon: Yvonne Kendall, MD;  Location: [facility] INVASIVE CV LAB;  Service: Cardiovascular;  Laterality: N/A;   TEE WITHOUT CARDIOVERSION N/A 08/05/2022   Procedure: TRANSESOPHAGEAL ECHOCARDIOGRAM;  Surgeon: Debbe Odea, MD;  Location: [facility] ORS;   Service: Cardiovascular;  Laterality: N/A;    Family History  Problem Relation Age of Onset   Stroke Mother    Heart disease Father        MI   Heart attack Father    Breast cancer Sister    Colon cancer Neg Hx    Prostate cancer Neg Hx     SOCIAL HX: reviewed.    Current Outpatient Medications:    acetaminophen (TYLENOL) 500 MG tablet, Take 1,000 mg by mouth every 8 (eight) hours. For knee pain, Disp: , Rfl:    acidophilus (RISAQUAD) CAPS capsule, Take 1 capsule by mouth daily., Disp: 30 capsule, Rfl: 0   apixaban (ELIQUIS) 2.5 MG TABS tablet, TAKE ONE TABLET BY MOUTH TWICE DAILY (DVT PREVENTION), Disp: 180 tablet, Rfl: 1   clopidogrel (PLAVIX) 75 MG tablet, TAKE 1 TABLET BY MOUTH EVERY DAY WITH BREAKFAST, Disp: 90 tablet, Rfl: 2   diltiazem (CARDIZEM CD) 360 MG 24 hr capsule, Take 1 capsule (360 mg total) by mouth daily., Disp: 30 capsule, Rfl: 0   furosemide (LASIX) 20 MG tablet, Take 1 tablet (20 mg total) by mouth every Monday, Wednesday, and Friday., Disp: 30 tablet, Rfl: 2   hydrALAZINE (APRESOLINE) 10 MG tablet, Take 1 tablet (10 mg total) by mouth 3 (three) times daily., Disp: 90 tablet, Rfl: 0   metoprolol succinate (TOPROL-XL) 100 MG 24 hr tablet, TAKE 1 TABLET BY MOUTH EVERY DAY WITH A MEAL, Disp: 90 tablet, Rfl: 2   Multiple Vitamins-Iron (MULTIVITAMINS WITH IRON) TABS, Take 1 tablet by mouth daily., Disp: , Rfl:    pantoprazole (PROTONIX) 40 MG tablet, TAKE ONE TABLET BY MOUTH IN THE MORNING, Disp: 90 tablet, Rfl: 1   polyethylene glycol (MIRALAX / GLYCOLAX) 17 g packet, Take 17 g by mouth daily., Disp: , Rfl:    QUEtiapine (SEROQUEL) 25 MG tablet, Take 0.5 tablets (12.5 mg total) by mouth at bedtime as needed (for sleep)., Disp: 20 tablet, Rfl: 0   rosuvastatin (CRESTOR) 10 MG tablet, Take 1 tablet (10 mg total) by mouth daily., Disp: 90 tablet, Rfl: 3  Reviewed and reconciled medications with pts wife.   EXAM:  VITALS per patient if applicable: blood pressure  143/78, 96%, temp - 97 with pulse 82  GENERAL: alert, oriented, appears to be in no acute distress  HEENT: atraumatic, conjunttiva clear, no obvious abnormalities on inspection of external nose and ears  NECK: normal movements of the head and neck  LUNGS: on inspection no signs of respiratory distress, breathing rate appears normal, no obvious gross SOB, gasping or wheezing  CV: no obvious cyanosis  PSYCH/NEURO: pleasant and cooperative, no obvious depression or anxiety, speech and thought processing grossly intact  ASSESSMENT AND PLAN:  Discussed the following assessment and plan:  Problem List Items Addressed This Visit     Aneurysm artery, iliac common (HCC)    Reevaluation [facility] 01/18/22 - recommended f/u every other year.       Aortic atherosclerosis (HCC)    Continue crestor.        Carotid artery disease (HCC)    F/u 01/18/22 - carotid duplex today shows stable 1  to 39% right ICA stenosis with some progression of his left ICA stenosis now into the 40 to 59% range. Continue aspirin, Plavix, and Crestor. Follow-up - 12 months.       Effusion of right knee    Has seen ortho. Continue to f/u with ortho.       Heart failure with preserved ejection fraction (HCC)    ECHO - 09/2022 - EF 50-55%. Does not appear to be volume overloaded.  Follow.       Hyperlipidemia    On crestor. Follow lipid panel and liver function tests.        Hypertension    Blood pressure as outlined. Per review, currently on hydralazine, metoprolol and diltiazem.  Follow pressures. Continue to monitor blood pressures.  Follow metabolic panel.       Penetrating atherosclerotic ulcer of aorta (HCC)    Previous admission 07/20/22 - 08/09/22 - back pain, aortic ulcer with repair 5/22. Have discussed f/u with [facility].       Persistent atrial fibrillation (HCC)    Continues on metoprolol and eliquis.        Sleeping difficulty    Discussed sleep issues are probably multifactorial. Days and nights  appear to be confused. Vivid dreams as outlined.  Started on seroquel as directed.  Hold on increasing dose since he just started.  Follow.       Thrombocytopenia (HCC) - Primary    Saw hematology.  Recommended f/u 2-3x/year.  Follow cbc.       Weakness    With recent hospitalizations and infection, osteomyelitis, etc - increased leg weakness.  Home health PT and OT recommended.  OT evaluation yesterday. PT today.  Follow.        No follow-ups on file.   I discussed the assessment and treatment plan with the patient. The patient was provided an opportunity to ask questions and all were answered. The patient agreed with the plan and demonstrated an understanding of the instructions.   The patient was advised to call back or seek an in-person evaluation if the symptoms worsen or if the condition fails to improve as anticipated.   Dale [place], MD

## 2023-02-09 ENCOUNTER — Encounter: Payer: Self-pay | Admitting: Internal Medicine

## 2023-02-09 DIAGNOSIS — R531 Weakness: Secondary | ICD-10-CM | POA: Insufficient documentation

## 2023-02-09 NOTE — Assessment & Plan Note (Signed)
Previous admission 07/20/22 - 08/09/22 - back pain, aortic ulcer with repair 5/22. Have discussed f/u with [facility].

## 2023-02-09 NOTE — Assessment & Plan Note (Signed)
Has seen ortho. Continue to f/u with ortho.

## 2023-02-09 NOTE — Assessment & Plan Note (Signed)
ECHO - 09/2022 - EF 50-55%. Does not appear to be volume overloaded.  Follow.

## 2023-02-09 NOTE — Assessment & Plan Note (Signed)
With recent hospitalizations and infection, osteomyelitis, etc - increased leg weakness.  Home health PT and OT recommended.  OT evaluation yesterday. PT today.  Follow.

## 2023-02-09 NOTE — Assessment & Plan Note (Signed)
Saw hematology.  Recommended f/u 2-3x/year.  Follow cbc.

## 2023-02-09 NOTE — Assessment & Plan Note (Signed)
Discussed sleep issues are probably multifactorial. Days and nights appear to be confused. Vivid dreams as outlined.  Started on seroquel as directed.  Hold on increasing dose since he just started.  Follow.

## 2023-02-09 NOTE — Assessment & Plan Note (Signed)
F/u 01/18/22 - carotid duplex today shows stable 1 to 39% right ICA stenosis with some progression of his left ICA stenosis now into the 40 to 59% range. Continue aspirin, Plavix, and Crestor. Follow-up - 12 months.  

## 2023-02-09 NOTE — Assessment & Plan Note (Signed)
Reevaluation AVVS 01/18/22 - recommended f/u every other year.  

## 2023-02-09 NOTE — Assessment & Plan Note (Signed)
Continue crestor 

## 2023-02-09 NOTE — Assessment & Plan Note (Signed)
Continues on metoprolol and eliquis.

## 2023-02-09 NOTE — Assessment & Plan Note (Signed)
On crestor.  Follow lipid panel and liver function tests.   

## 2023-02-09 NOTE — Assessment & Plan Note (Signed)
Blood pressure as outlined. Per review, currently on hydralazine, metoprolol and diltiazem.  Follow pressures. Continue to monitor blood pressures.  Follow metabolic panel.

## 2023-02-10 DIAGNOSIS — I2584 Coronary atherosclerosis due to calcified coronary lesion: Secondary | ICD-10-CM | POA: Diagnosis not present

## 2023-02-10 DIAGNOSIS — I5030 Unspecified diastolic (congestive) heart failure: Secondary | ICD-10-CM | POA: Diagnosis not present

## 2023-02-10 DIAGNOSIS — M4620 Osteomyelitis of vertebra, site unspecified: Secondary | ICD-10-CM | POA: Diagnosis not present

## 2023-02-10 DIAGNOSIS — J9601 Acute respiratory failure with hypoxia: Secondary | ICD-10-CM | POA: Diagnosis not present

## 2023-02-10 DIAGNOSIS — J90 Pleural effusion, not elsewhere classified: Secondary | ICD-10-CM | POA: Diagnosis not present

## 2023-02-10 DIAGNOSIS — N183 Chronic kidney disease, stage 3 unspecified: Secondary | ICD-10-CM | POA: Diagnosis not present

## 2023-02-16 DIAGNOSIS — M4620 Osteomyelitis of vertebra, site unspecified: Secondary | ICD-10-CM | POA: Diagnosis not present

## 2023-02-16 DIAGNOSIS — N183 Chronic kidney disease, stage 3 unspecified: Secondary | ICD-10-CM | POA: Diagnosis not present

## 2023-02-16 DIAGNOSIS — J90 Pleural effusion, not elsewhere classified: Secondary | ICD-10-CM | POA: Diagnosis not present

## 2023-02-16 DIAGNOSIS — J9601 Acute respiratory failure with hypoxia: Secondary | ICD-10-CM | POA: Diagnosis not present

## 2023-02-16 DIAGNOSIS — I5030 Unspecified diastolic (congestive) heart failure: Secondary | ICD-10-CM | POA: Diagnosis not present

## 2023-02-16 DIAGNOSIS — I2584 Coronary atherosclerosis due to calcified coronary lesion: Secondary | ICD-10-CM | POA: Diagnosis not present

## 2023-02-18 ENCOUNTER — Telehealth: Payer: Self-pay

## 2023-02-18 NOTE — Telephone Encounter (Signed)
Confirmed no acute symptoms. Nurse stated that he doesn't really show any signs of UTI at the moment. He is pretty much baseline how he has been. She was not aware that we had done HFU visit since discharge. This was more of an update but wanted to mention getting a urine if needed. Holding on urine for now. She is going to see how he is when she goes to visit him again and then if feels urine is warranted, will call for order.

## 2023-02-18 NOTE — Telephone Encounter (Signed)
I am ok with checking urine, but need to clarify a few things. He was admitted recently. Work up for possible stroke. Please confirm no acute symptoms.

## 2023-02-18 NOTE — Telephone Encounter (Signed)
Fannie Knee called from [facility] to state patient has been drowsy since he got home from the hospital.  Fannie Knee states patient's vital signs are ok.  Fannie Knee states patient is sleepy all the time.  Fannie Knee states about two weeks ago patient had an episode where he forgot how to hold the cup and swallow while he was eating.  Fannie Knee states patient has been experiencing left heel pain at night.  Fannie Knee states she doesn't see anything in that area.  Fannie Knee states when patient was in the hospital one of his doctors thought he may have had a slight stroke, but another doctor said patient had not had a stroke.  Fannie Knee states patient is not eating as well as he did before he went to the hospital.  Fannie Knee states she would like to know if she leaves a sterile cup with patient, can he bring in the sample.

## 2023-02-18 NOTE — Telephone Encounter (Signed)
Home Home called back they will do the UA.

## 2023-02-18 NOTE — Telephone Encounter (Signed)
FYI urine order given per wife request. Dx used was drowsiness.

## 2023-02-20 ENCOUNTER — Telehealth: Payer: Self-pay

## 2023-02-20 ENCOUNTER — Other Ambulatory Visit: Payer: Self-pay | Admitting: Internal Medicine

## 2023-02-20 NOTE — Telephone Encounter (Signed)
Is there something I am supposed to sign?

## 2023-02-20 NOTE — Telephone Encounter (Signed)
Signed only order that was in e-fax.

## 2023-02-20 NOTE — Telephone Encounter (Signed)
Copied from CRM [id number]. Topic: Clinical - Request for Lab/Test Order >> Feb 20, 2023  1:38 PM Deaijah H wrote: Reason for CRM: [name] with [employer] CALLING TO CHECK STATUS OF ORDERS PHYSICIAN , RESUMPTION OF CARE , PLAN OF CARE / Callback # [phone]

## 2023-02-21 ENCOUNTER — Other Ambulatory Visit: Payer: Self-pay

## 2023-02-21 DIAGNOSIS — G319 Degenerative disease of nervous system, unspecified: Secondary | ICD-10-CM | POA: Insufficient documentation

## 2023-02-21 DIAGNOSIS — Z7901 Long term (current) use of anticoagulants: Secondary | ICD-10-CM | POA: Insufficient documentation

## 2023-02-21 DIAGNOSIS — R29810 Facial weakness: Secondary | ICD-10-CM | POA: Diagnosis not present

## 2023-02-21 DIAGNOSIS — Z743 Need for continuous supervision: Secondary | ICD-10-CM | POA: Diagnosis not present

## 2023-02-21 DIAGNOSIS — I110 Hypertensive heart disease with heart failure: Secondary | ICD-10-CM | POA: Diagnosis not present

## 2023-02-21 DIAGNOSIS — I5030 Unspecified diastolic (congestive) heart failure: Secondary | ICD-10-CM | POA: Insufficient documentation

## 2023-02-21 DIAGNOSIS — I700 Atherosclerosis of aorta: Secondary | ICD-10-CM | POA: Diagnosis not present

## 2023-02-21 DIAGNOSIS — R451 Restlessness and agitation: Secondary | ICD-10-CM | POA: Insufficient documentation

## 2023-02-21 DIAGNOSIS — I2510 Atherosclerotic heart disease of native coronary artery without angina pectoris: Secondary | ICD-10-CM | POA: Insufficient documentation

## 2023-02-21 DIAGNOSIS — I480 Paroxysmal atrial fibrillation: Secondary | ICD-10-CM | POA: Diagnosis not present

## 2023-02-21 DIAGNOSIS — I6782 Cerebral ischemia: Secondary | ICD-10-CM | POA: Insufficient documentation

## 2023-02-21 DIAGNOSIS — R519 Headache, unspecified: Secondary | ICD-10-CM | POA: Diagnosis not present

## 2023-02-21 DIAGNOSIS — Z20822 Contact with and (suspected) exposure to covid-19: Secondary | ICD-10-CM | POA: Insufficient documentation

## 2023-02-21 DIAGNOSIS — M4620 Osteomyelitis of vertebra, site unspecified: Secondary | ICD-10-CM | POA: Diagnosis not present

## 2023-02-21 DIAGNOSIS — J9601 Acute respiratory failure with hypoxia: Secondary | ICD-10-CM | POA: Diagnosis not present

## 2023-02-21 DIAGNOSIS — I6523 Occlusion and stenosis of bilateral carotid arteries: Secondary | ICD-10-CM | POA: Diagnosis not present

## 2023-02-21 DIAGNOSIS — R4182 Altered mental status, unspecified: Secondary | ICD-10-CM | POA: Insufficient documentation

## 2023-02-21 DIAGNOSIS — R410 Disorientation, unspecified: Secondary | ICD-10-CM | POA: Diagnosis not present

## 2023-02-21 DIAGNOSIS — R531 Weakness: Secondary | ICD-10-CM | POA: Diagnosis not present

## 2023-02-21 DIAGNOSIS — I2584 Coronary atherosclerosis due to calcified coronary lesion: Secondary | ICD-10-CM | POA: Diagnosis not present

## 2023-02-21 DIAGNOSIS — I10 Essential (primary) hypertension: Secondary | ICD-10-CM | POA: Diagnosis not present

## 2023-02-21 LAB — BASIC METABOLIC PANEL WITH GFR
Anion gap: 10 (ref 5–15)
BUN: 17 mg/dL (ref 8–23)
CO2: 22 mmol/L (ref 22–32)
Calcium: 10.3 mg/dL (ref 8.9–10.3)
Chloride: 106 mmol/L (ref 98–111)
Creatinine, Ser: 1.04 mg/dL (ref 0.61–1.24)
GFR, Estimated: >60 mL/min
Glucose, Bld: 103 mg/dL — ABNORMAL HIGH (ref 70–99)
Potassium: 3.7 mmol/L (ref 3.5–5.1)
Sodium: 138 mmol/L (ref 135–145)

## 2023-02-21 LAB — CBC
HCT: 38 % — ABNORMAL LOW (ref 39.0–52.0)
Hemoglobin: 11.8 g/dL — ABNORMAL LOW (ref 13.0–17.0)
MCH: 31.9 pg (ref 26.0–34.0)
MCHC: 31.1 g/dL (ref 30.0–36.0)
MCV: 102.7 fL — ABNORMAL HIGH (ref 80.0–100.0)
Platelets: 225 10*3/uL (ref 150–400)
RBC: 3.7 MIL/uL — ABNORMAL LOW (ref 4.22–5.81)
RDW: 14.5 % (ref 11.5–15.5)
WBC: 7.8 10*3/uL (ref 4.0–10.5)
nRBC: 0 % (ref 0.0–0.2)

## 2023-02-21 LAB — CBG MONITORING, ED: Glucose-Capillary: 95 mg/dL (ref 70–99)

## 2023-02-21 NOTE — ED Triage Notes (Signed)
[name] from home with Home Health. EMS reports call out from Wife d/t concerns for increased confusion, weakness, and right eye droop/discharge. Unable to reach family for additional information. Pt disoriented to time and situation.

## 2023-02-21 NOTE — Telephone Encounter (Signed)
faxed

## 2023-02-21 NOTE — ED Notes (Signed)
First Nurse note: pt BIB ems from home with c/o stroke like symptoms.  Per ems, pts wife states that pts right eye has been "droopy" today since appx 1400.  Pt has hx of CVA, and is bed-bound at baseline.  Pt able to answer all questions w/ems.  Ems stroke screen neg.

## 2023-02-22 ENCOUNTER — Emergency Department
Admission: EM | Admit: 2023-02-22 | Discharge: 2023-02-22 | Disposition: A | Discharge status: Home / Self Care | Payer: Medicare HMO | Attending: Emergency Medicine | Admitting: Emergency Medicine

## 2023-02-22 ENCOUNTER — Emergency Department: Payer: Medicare HMO

## 2023-02-22 DIAGNOSIS — G319 Degenerative disease of nervous system, unspecified: Secondary | ICD-10-CM | POA: Diagnosis not present

## 2023-02-22 DIAGNOSIS — I10 Essential (primary) hypertension: Secondary | ICD-10-CM | POA: Diagnosis not present

## 2023-02-22 DIAGNOSIS — I6782 Cerebral ischemia: Secondary | ICD-10-CM | POA: Diagnosis not present

## 2023-02-22 DIAGNOSIS — R519 Headache, unspecified: Secondary | ICD-10-CM | POA: Diagnosis not present

## 2023-02-22 DIAGNOSIS — R29810 Facial weakness: Secondary | ICD-10-CM

## 2023-02-22 DIAGNOSIS — I700 Atherosclerosis of aorta: Secondary | ICD-10-CM | POA: Diagnosis not present

## 2023-02-22 DIAGNOSIS — R531 Weakness: Secondary | ICD-10-CM | POA: Diagnosis not present

## 2023-02-22 DIAGNOSIS — R410 Disorientation, unspecified: Secondary | ICD-10-CM | POA: Diagnosis not present

## 2023-02-22 DIAGNOSIS — R4182 Altered mental status, unspecified: Secondary | ICD-10-CM

## 2023-02-22 DIAGNOSIS — Z743 Need for continuous supervision: Secondary | ICD-10-CM | POA: Diagnosis not present

## 2023-02-22 DIAGNOSIS — I6523 Occlusion and stenosis of bilateral carotid arteries: Secondary | ICD-10-CM | POA: Diagnosis not present

## 2023-02-22 LAB — URINALYSIS, ROUTINE W REFLEX MICROSCOPIC
Bilirubin Urine: NEGATIVE
Glucose, UA: NEGATIVE mg/dL
Hgb urine dipstick: NEGATIVE
Ketones, ur: NEGATIVE mg/dL
Leukocytes,Ua: NEGATIVE
Nitrite: NEGATIVE
Protein, ur: NEGATIVE mg/dL
Specific Gravity, Urine: 1.012 (ref 1.005–1.030)
pH: 6 (ref 5.0–8.0)

## 2023-02-22 LAB — HEPATIC FUNCTION PANEL
ALT: 13 U/L (ref 0–44)
AST: 31 U/L (ref 15–41)
Albumin: 3.8 g/dL (ref 3.5–5.0)
Alkaline Phosphatase: 64 U/L (ref 38–126)
Bilirubin, Direct: 0.3 mg/dL — ABNORMAL HIGH (ref 0.0–0.2)
Indirect Bilirubin: 0.7 mg/dL (ref 0.3–0.9)
Total Bilirubin: 1 mg/dL
Total Protein: 7 g/dL (ref 6.5–8.1)

## 2023-02-22 LAB — T4, FREE: Free T4: 0.94 ng/dL (ref 0.61–1.12)

## 2023-02-22 LAB — RESP PANEL BY RT-PCR (RSV, FLU A&B, COVID)  RVPGX2
Influenza A by PCR: NEGATIVE
Influenza B by PCR: NEGATIVE
Resp Syncytial Virus by PCR: NEGATIVE
SARS Coronavirus 2 by RT PCR: NEGATIVE

## 2023-02-22 LAB — TSH: TSH: 4.953 u[IU]/mL — ABNORMAL HIGH (ref 0.350–4.500)

## 2023-02-22 LAB — TROPONIN I (HIGH SENSITIVITY): Troponin I (High Sensitivity): 20 ng/L — ABNORMAL HIGH

## 2023-02-22 NOTE — ED Provider Notes (Addendum)
[facility] Provider Note    Event Date/Time   First MD Initiated Contact with Patient 02/22/23 [phone]     (approximate)   History   Weakness   HPI  Brett Riley is a 87 y.o. male   Past medical history of diastolic CHF, CAD, paroxysmal atrial fibrillation on Eliquis, hypertension, prior stroke, history of acute metabolic encephalopathy with hypoactive delirium with waxing and waning mental status during hospitalization in November 2020 for, recent history of pneumonias requiring hospitalization, and chronic right knee pain and weakness/debility has not walked in months who presents to the emergency department with altered mental status over the last 1 week worsening confusion and occasional agitation.  No trauma or recent illnesses reported by wife and patient.  Specifically denies chest pain, shortness of breath, cough, congestion, fever or chills.  No urinary symptoms.  No abdominal pain.  No trauma.  Wife also noted that it seems that his right side of his face has been "droopy" today since the early afternoon.  He had a stroke workup recently during a hospitalization in November that showed infarction on MRI that was not likely related to his symptoms at that time and was seen by neurology and continued on maximal medical management.  Independent Historian contributed to assessment above: His wife gives collateral information past medical history as above  External Medical Documents Reviewed: Hospitalization discharge summary from November 2024 documenting the above      Physical Exam   Triage Vital Signs: ED Triage Vitals [02/21/23 2217]  Encounter Vitals Group     BP 104/84     Systolic BP Percentile      Diastolic BP Percentile      Pulse Rate 75     Resp (!) 21     Temp 98.5 F (36.9 C)     Temp Source Oral     SpO2 94 %     Weight      Height      Head Circumference      Peak Flow      Pain Score      Pain Loc      Pain Education       Exclude from Growth Chart     Most recent vital signs: Vitals:   02/21/23 2217 02/22/23 0628  BP: 104/84 (!) 176/82  Pulse: 75 73  Resp: (!) 21 15  Temp: 98.5 F (36.9 C)   SpO2: 94% 97%    General: Awake, no distress.  CV:  Good peripheral perfusion.  Resp:  Normal effort.  Abd:  No distention.  Other:  He does appear to have some slight droop to the right side of his face both the eye and the mouth.  He has no other focal neurologic deficits considering I did not test his gait as he is bedbound at baseline.  He is less able to move his right knee with he attributes that to his chronic knee pain.  He is awake alert oriented to self, situation   ED Results / Procedures / Treatments   Labs (all labs ordered are listed, but only abnormal results are displayed) Labs Reviewed  BASIC METABOLIC PANEL - Abnormal; Notable for the following components:      Result Value   Glucose, Bld 103 (*)    All other components within normal limits  CBC - Abnormal; Notable for the following components:   RBC 3.70 (*)    Hemoglobin 11.8 (*)    HCT  38.0 (*)    MCV 102.7 (*)    All other components within normal limits  URINALYSIS, ROUTINE W REFLEX MICROSCOPIC - Abnormal; Notable for the following components:   Color, Urine YELLOW (*)    APPearance HAZY (*)    All other components within normal limits  HEPATIC FUNCTION PANEL - Abnormal; Notable for the following components:   Bilirubin, Direct 0.3 (*)    All other components within normal limits  TSH - Abnormal; Notable for the following components:   TSH 4.953 (*)    All other components within normal limits  TROPONIN I (HIGH SENSITIVITY) - Abnormal; Notable for the following components:   Troponin I (High Sensitivity) 20 (*)    All other components within normal limits  RESP PANEL BY RT-PCR (RSV, FLU A&B, COVID)  RVPGX2  T4, FREE  CBG MONITORING, ED     I ordered and reviewed the above labs they are notable for cell counts  electrolytes unremarkable urinalysis does not appear infected.  EKG  ED ECG REPORT I, Pilar Jarvis, the attending physician, personally viewed and interpreted this ECG.   Date: 02/22/2023  EKG Time: 2228  Rate: 72  Rhythm: sinus  Axis: nl  Intervals:none  ST&T Change: no stemi    RADIOLOGY I independently reviewed and interpreted CT of the head see no obvious bleeding or midline shift I also reviewed radiologist's formal read.   PROCEDURES:  Critical Care performed: No  Procedures   MEDICATIONS ORDERED IN ED: Medications - No data to display   IMPRESSION / MDM / ASSESSMENT AND PLAN / ED COURSE  I reviewed the triage vital signs and the nursing notes.                                Patient's presentation is most consistent with acute presentation with potential threat to life or bodily function.  Differential diagnosis includes, but is not limited to, stroke, infection, metabolic derangement, ICH, dysrhythmia, advancing dementia or delirium   The patient is on the cardiac monitor to evaluate for evidence of arrhythmia and/or significant heart rate changes.  MDM:    This is a patient with many medical comorbidities with right-sided facial droop outside of the stroke window and not thrombolytic candidate due to his anticoagulation status as well as mild symptoms.  May represent Bell's palsy.  Will check with CT head and MRI.  If negative for stroke it may represent Bell's palsy.  I be hesitant to start him on steroids in the setting of his altered mental status and delirium at baseline as this may worsen.  There is no sign of viral infection herpes infection so I will defer on antiviral as well.  I'd have him follow-up with his primary doctor or neurologist.  In terms of his worsening mental status, could represent worsening of his baseline as last hospitalization note documented hypoactive delirium waxing and waning mental status, but should check for metabolic or  infectious etiologies today with basic labs, urinalysis, viral swabs, chest x-ray to ensure there is nothing reversible in the acute setting of worsening this week.    -  Fortunately workup has been unremarkable in the emergency department patient has been stable.  I had a long discussion with the patient's wife regarding his declining potential neurologic disorders that should be follow-up by neurologist as outpatient for further evaluation and management options.  We discussed the potential for rehab placement but it  does not appear to be a good option for the patient his wife at this time as he is not ready to be placed and would like further evaluation by PMD and neurologist prior to considering these options.  They will follow-up with primary doctor and neurologist regarding his facial droop and potential Bell's palsy and further consideration of medications as needed.  FINAL CLINICAL IMPRESSION(S) / ED DIAGNOSES   Final diagnoses:  Facial droop  Altered mental status, unspecified altered mental status type     Rx / DC Orders   ED Discharge Orders     None        Note:  This document was prepared using Dragon voice recognition software and may include unintentional dictation errors.    Pilar Jarvis, MD 02/22/23 1610    Pilar Jarvis, MD 02/22/23 [phone]

## 2023-02-22 NOTE — Discharge Instructions (Addendum)
Your evaluation in the emergency department today did not show any emergency conditions that account for your symptoms.  The facial droop may represent Bell's palsy for which I have provided you a printout of more information.  There are some potential treatments for this condition including antiviral medications and steroids.  I did not start antiviral medications today because there is no obvious signs of a virus causing the Bell's palsy.  Steroids were considered but may worsen the delirium.  I want you to follow-up with your primary doctor discussed these treatment options if they are right for you.  In terms of the occasional agitation and cognition changes, I am not sure what is causing these worsening problems but I would like you to get in touch with a neurologist for further evaluation and management options for potential dementia or neurocognitive or neuromuscular disorders.  I gave you the number for Dr. Theora Master who is a neurologist associated with our hospital.  You can talk to your primary doctor about other neurologist that he or she may refer to as well.  Thank you for choosing [place] for your health care today!  Please see your primary doctor this week for a follow up appointment.   If you have any new, worsening, or unexpected symptoms call your doctor right away or come back to the emergency department for reevaluation.  It was my pleasure to care for you today.   Daneil Dan Modesto Charon, MD

## 2023-02-22 NOTE — ED Notes (Signed)
Pt appears to be resting quietly pt spouse provided water

## 2023-02-22 NOTE — ED Notes (Signed)
Patient Alert and oriented to baseline. Stable and ambulatory to baseline. Patient verbalized understanding of the discharge instructions.  Patient belongings were taken by the patient.   

## 2023-02-24 ENCOUNTER — Telehealth: Payer: Self-pay | Admitting: Internal Medicine

## 2023-02-24 ENCOUNTER — Telehealth: Payer: Self-pay

## 2023-02-24 NOTE — Telephone Encounter (Signed)
Signed by doc of day and faxed to home health nurse/

## 2023-02-24 NOTE — Telephone Encounter (Signed)
Noted  

## 2023-02-24 NOTE — Telephone Encounter (Signed)
Copied from CRM [id number]. Topic: Appointments - Appointment Scheduling >> Feb 24, 2023 12:38 PM Sonny Dandy B wrote: Patient/patient representative is calling to schedule an appointment.  Pt would need to be sent link for appt tomorrow @ 10 40am virtual visit.

## 2023-02-24 NOTE — Telephone Encounter (Signed)
Copied from CRM [id number]. Topic: Clinical - Home Health Verbal Orders >> Feb 24, 2023 11:06 AM Tiffany H wrote: Caller/Agency: [facility] Foley Number: [phone] Service Requested: Lamar Laundry w/ [facility] called to follow up on a request for Wound Care and Resumption of Care. Please assist. Request originally made on 02/10/23. First order was done on 12/31/22. Please expedite today.  Frequency: NA Any new concerns about the patient? Yes  Phone: [phone] Fax: [phone]

## 2023-02-24 NOTE — Telephone Encounter (Signed)
Brett Riley is resending order for Dr Clent Ridges to sign since she needs back asap. Will fax once received

## 2023-02-25 ENCOUNTER — Inpatient Hospital Stay: Payer: Medicare HMO | Admitting: Family Medicine

## 2023-03-04 ENCOUNTER — Telehealth: Payer: Self-pay

## 2023-03-04 NOTE — Telephone Encounter (Signed)
 Copied from CRM [id number]. Topic: Clinical - Home Health Verbal Orders >> Mar 04, 2023 11:53 AM Burnard DEL wrote: Caller/Agency: [facility] Mer[facility] R[facility] Number: [phone] Service Requested: Occupational Therapy Pt to co-treat together Frequency: 4 wks Any new concerns about the patient? No

## 2023-03-04 NOTE — Telephone Encounter (Signed)
 LM for Brett Riley

## 2023-03-06 ENCOUNTER — Telehealth: Payer: Self-pay

## 2023-03-06 LAB — MISC LABCORP TEST (SEND OUT): Labcorp test code: 520085

## 2023-03-06 NOTE — Telephone Encounter (Signed)
 Copied from CRM [id number]. Topic: General - Other >> Mar 06, 2023  3:09 PM Dennison Nancy wrote: Reason for CRM: laura  mitchell with  [facility] returning a call  from Kristie Cowman regarding verbal orders   please call laura mitchell at [phone]- [phone]

## 2023-03-07 ENCOUNTER — Telehealth: Payer: Self-pay | Admitting: Internal Medicine

## 2023-03-07 NOTE — Telephone Encounter (Signed)
 Copied from CRM [id number]. Topic: Referral - Question >> Mar 07, 2023  4:06 PM Robinson H wrote: Reason for CRM: Patients wife states that Physical and Occupational therapy will be cancelled through [facility] because patient now has [employer] and is out of network with [facility]. Patient needs a referral and a new care plan sent to [facility] for continuation of coverage [phone] phone [phone] fax. Advised patient that verbal order was given to Leita Glatter today but Leita is with [facility] which insurance won't cover anymore, please reach out to patients wife for clarification.   Jackolyn [phone]

## 2023-03-07 NOTE — Telephone Encounter (Signed)
 Ok

## 2023-03-07 NOTE — Telephone Encounter (Signed)
 Pt will be switching HH companies. Ok to place new referral for PT and OT?

## 2023-03-07 NOTE — Telephone Encounter (Signed)
 Verbals given to [employer]

## 2023-03-07 NOTE — Telephone Encounter (Signed)
 See other note

## 2023-03-07 NOTE — Telephone Encounter (Signed)
 ok

## 2023-03-07 NOTE — Telephone Encounter (Signed)
 Ok to give verbal orders for OT for 4 wks?

## 2023-03-10 ENCOUNTER — Telehealth: Payer: Self-pay

## 2023-03-10 DIAGNOSIS — E785 Hyperlipidemia, unspecified: Secondary | ICD-10-CM

## 2023-03-10 DIAGNOSIS — I6523 Occlusion and stenosis of bilateral carotid arteries: Secondary | ICD-10-CM

## 2023-03-10 DIAGNOSIS — R531 Weakness: Secondary | ICD-10-CM

## 2023-03-10 DIAGNOSIS — I723 Aneurysm of iliac artery: Secondary | ICD-10-CM

## 2023-03-10 MED ORDER — CLOPIDOGREL BISULFATE 75 MG PO TABS: 75.0000 mg | ORAL_TABLET | Freq: Every day | ORAL | 2 refills | Status: AC

## 2023-03-10 MED ORDER — FUROSEMIDE 20 MG PO TABS: 20.0000 mg | ORAL_TABLET | ORAL | 2 refills | Status: DC

## 2023-03-10 MED ORDER — ROSUVASTATIN CALCIUM 10 MG PO TABS: 10.0000 mg | ORAL_TABLET | Freq: Every day | ORAL | 3 refills | Status: AC

## 2023-03-10 MED ORDER — HYDRALAZINE HCL 10 MG PO TABS: 10.0000 mg | ORAL_TABLET | Freq: Three times a day (TID) | ORAL | 1 refills | Status: DC

## 2023-03-10 MED ORDER — DILTIAZEM HCL ER COATED BEADS 360 MG PO CP24: 360.0000 mg | ORAL_CAPSULE | Freq: Every day | ORAL | 1 refills | Status: DC

## 2023-03-10 MED ORDER — METOPROLOL SUCCINATE ER 100 MG PO TB24: ORAL_TABLET | ORAL | 2 refills | Status: DC

## 2023-03-10 NOTE — Telephone Encounter (Signed)
 See other note

## 2023-03-10 NOTE — Telephone Encounter (Signed)
 See me about earlier work in appt.

## 2023-03-10 NOTE — Telephone Encounter (Signed)
 Copied from CRM [id number]. Topic: Clinical - Home Health Verbal Orders >> Mar 10, 2023 10:46 AM Annabella H wrote: Patient's wife Jackolyn called to advise that OT/PT not covered in this network. Patient needs a new care plan to satisfy insurance requirements. Patient has continuing issue. Patient's wife Jackolyn called [facility] but they've not received new orders. Patient has ceased to receive Wound Care, too. Please expedite.   Patient had an ED visit on vacation. Patient now has a problem with right eye. It's drooping and looks infected.   Called [employer] and was told to send a [employer] message. Advised of outstanding [employer] messages.   [facility]:  Fax: [phone] Phone: [phone]

## 2023-03-10 NOTE — Addendum Note (Signed)
 Addended by: Rita Ohara D on: 03/10/2023 02:06 PM   Modules accepted: Orders

## 2023-03-10 NOTE — Telephone Encounter (Signed)
 FYI- discussed with wife. Sent over new [facility] referral for PT, OT, nursing, and wound care (old skin tear on elbow.) Sent his prescriptions to new pharmacy. Discussed his eye with wife. Eye is red and drooping. Dx with bells palsy 12/21. Advised that they need to use the drops given to keep moisture in the eye. Patients wife thinks that he need abx drops for his eye because he keeps putting his hand in it and it is red. Advised would need to move appt up to look at eye. Also, wife requested permanent handicap form and would like for me to move up Feb neurology appt. They are already on a cancellation list.  Let me know about sooner appt. He has appt with you 1/15.

## 2023-03-12 NOTE — Telephone Encounter (Signed)
 Offered work in appt today. Wife declined due to another appt. Says eye is actually doing better.

## 2023-03-19 ENCOUNTER — Telehealth (INDEPENDENT_AMBULATORY_CARE_PROVIDER_SITE_OTHER): Payer: PPO | Admitting: Internal Medicine

## 2023-03-19 VITALS — BP 155/62 | HR 69

## 2023-03-19 DIAGNOSIS — I5032 Chronic diastolic (congestive) heart failure: Secondary | ICD-10-CM | POA: Diagnosis not present

## 2023-03-19 DIAGNOSIS — I4819 Other persistent atrial fibrillation: Secondary | ICD-10-CM

## 2023-03-19 DIAGNOSIS — R531 Weakness: Secondary | ICD-10-CM

## 2023-03-19 DIAGNOSIS — G479 Sleep disorder, unspecified: Secondary | ICD-10-CM | POA: Diagnosis not present

## 2023-03-19 DIAGNOSIS — D696 Thrombocytopenia, unspecified: Secondary | ICD-10-CM

## 2023-03-19 DIAGNOSIS — I739 Peripheral vascular disease, unspecified: Secondary | ICD-10-CM

## 2023-03-19 DIAGNOSIS — I25118 Atherosclerotic heart disease of native coronary artery with other forms of angina pectoris: Secondary | ICD-10-CM | POA: Diagnosis not present

## 2023-03-19 DIAGNOSIS — N1832 Chronic kidney disease, stage 3b: Secondary | ICD-10-CM | POA: Diagnosis not present

## 2023-03-19 DIAGNOSIS — I10 Essential (primary) hypertension: Secondary | ICD-10-CM

## 2023-03-19 DIAGNOSIS — I723 Aneurysm of iliac artery: Secondary | ICD-10-CM

## 2023-03-19 DIAGNOSIS — I700 Atherosclerosis of aorta: Secondary | ICD-10-CM

## 2023-03-19 DIAGNOSIS — I779 Disorder of arteries and arterioles, unspecified: Secondary | ICD-10-CM | POA: Diagnosis not present

## 2023-03-19 DIAGNOSIS — E785 Hyperlipidemia, unspecified: Secondary | ICD-10-CM

## 2023-03-19 NOTE — Progress Notes (Signed)
Patient ID: Brett Riley, male   DOB: 06/24/1935, 88 y.o.   MRN: [id number]   Virtual Visit via video Note  I connected with [name] by a video enabled telemedicine application and verified that I am speaking with the correct person using two identifiers. Location patient: home Location provider: work  Persons participating in the virtual visit: patient, provider and pts wife - [place].   The limitations, risks, security and privacy concerns of performing an evaluation and management service by video and the availability of in person appointments have been discussed. It has also been discussed with the patient that there may be a patient responsible charge related to this service. The patient expressed understanding and agreed to proceed.   Reason for visit: work in appt  HPI: Has a history of diastolic CHF, CAD, paroxysmal afib on eliquis, hypertension. Admitted 01/14/23 - 01/23/23 - after presenting with slurred speech and confusion. MRI showed "Single punctate acute ischemic nonhemorrhagic cortical infarct involving the high posterior left frontal cortex near the vertex."  Neurology felt -  unlikely to account for pt's presenting symptoms.  Neuro was consulted and had no additional rec. Continues on eliquis, plavix and statin. Started on low dose seroquel. Was treated for pneumonia with ceftriaxone and azithromycin. Reevaluated in ER 02/22/23 - altered mental status/confusion and right facial droop. MRI - no acute abnormality. Extensive chronic small vessel ischemia. Brain atrophy - greatest at the cerebellum. Facial droop improved. They discussed with her - Bell's Palsy. Discussed using artificial tears. He is able to close his eyelid now. Has appt with neurology in 04/2023. Has had recent hospitalizations - various health concerns. This has left him - unable to get out of bed. Is used to being very active. He does do exercises in the bed. He denies any pain. Breathing stable.  Eating  some.  Does not want nutritional shakes. No abdominal pain reported. They have been treating - sacral wound. Improved. Scabbed over - per wife. Trying to keep bowels moving with prune juice and miralax daily. He is weak.  Unable to stand. She is trying to take care of him and has sitters that come a few hours a day. Discussed palliative care.    ROS: See pertinent positives and negatives per HPI.  Past Medical History:  Diagnosis Date   Cancer [facility])    skin cancer basal   Carotid arterial disease (HCC)    a. 02/2016 L CEA; b. 01/2017 U/S: patent LICA, 1-39% RICA.   Celiac artery stenosis (HCC)    Coronary artery disease    a. 01/2016 MV: mild apical/basal inferior, apical lateral, mid anterolateral, and mid inferolateral ischemia. EF 57%; b. 02/2016 Cath: LM 40ost, LAD 70p/m, 20d, D1 95 (small), LCX nl, RCA 83m, RPDA 90 (small), EF 55-65%-->med Rx. Rec CABG for recurrent symptoms.   Hearing loss    History of echocardiogram    a. 02/2016 Echo: EF 50-55%, no rwma, mild MR.   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   Osteoarthritis, hip, bilateral    Left > Right    Past Surgical History:  Procedure Laterality Date   AORTIC INTERVENTION N/A 07/24/2022   Procedure: AORTIC INTERVENTION;  Surgeon: Renford Dills, MD;  Location: [facility] INVASIVE CV LAB;  Service: Cardiovascular;  Laterality: N/A;   CARDIAC CATHETERIZATION N/A 01/19/2016   Procedure: Left Heart Cath and Coronary Angiography;  Surgeon: Chelsea Aus  Kirke Corin, MD;  Location: [facility] INVASIVE CV LAB;  Service: Cardiovascular;  Laterality: N/A;   COLONOSCOPY     CYSTOSCOPY/URETEROSCOPY/HOLMIUM LASER/STENT PLACEMENT Right 04/13/2021   Procedure: CYSTOSCOPY/URETEROSCOPY/HOLMIUM LASER/STENT PLACEMENT;  Surgeon: Sondra Come, MD;  Location: [facility] ORS;  Service: Urology;  Laterality: Right;   ENDARTERECTOMY Left 02/15/2016   Procedure: ENDARTERECTOMY CAROTID;   Surgeon: Annice Needy, MD;  Location: [facility] ORS;  Service: Vascular;  Laterality: Left;   RIGHT HEART CATH N/A 09/19/2022   Procedure: RIGHT HEART CATH;  Surgeon: Yvonne Kendall, MD;  Location: [facility] INVASIVE CV LAB;  Service: Cardiovascular;  Laterality: N/A;   TEE WITHOUT CARDIOVERSION N/A 08/05/2022   Procedure: TRANSESOPHAGEAL ECHOCARDIOGRAM;  Surgeon: Debbe Odea, MD;  Location: [facility] ORS;  Service: Cardiovascular;  Laterality: N/A;    Family History  Problem Relation Age of Onset   Stroke Mother    Heart disease Father        MI   Heart attack Father    Breast cancer Sister    Colon cancer Neg Hx    Prostate cancer Neg Hx     SOCIAL HX: reviewed.    Current Outpatient Medications:    acetaminophen (TYLENOL) 500 MG tablet, Take 1,000 mg by mouth every 8 (eight) hours. For knee pain, Disp: , Rfl:    acidophilus (RISAQUAD) CAPS capsule, Take 1 capsule by mouth daily., Disp: 30 capsule, Rfl: 0   apixaban (ELIQUIS) 2.5 MG TABS tablet, TAKE ONE TABLET BY MOUTH TWICE DAILY (DVT PREVENTION), Disp: 180 tablet, Rfl: 1   clopidogrel (PLAVIX) 75 MG tablet, Take 1 tablet (75 mg total) by mouth daily., Disp: 90 tablet, Rfl: 2   diltiazem (CARDIZEM CD) 360 MG 24 hr capsule, Take 1 capsule (360 mg total) by mouth daily. TAKE 1 CAPSULE BY MOUTH EVERY DAY, Disp: 90 capsule, Rfl: 1   furosemide (LASIX) 20 MG tablet, Take 1 tablet (20 mg total) by mouth every Monday, Wednesday, and Friday., Disp: 30 tablet, Rfl: 2   hydrALAZINE (APRESOLINE) 10 MG tablet, Take 1 tablet (10 mg total) by mouth 3 (three) times daily., Disp: 270 tablet, Rfl: 1   metoprolol succinate (TOPROL-XL) 100 MG 24 hr tablet, TAKE 1 TABLET BY MOUTH EVERY DAY WITH A MEAL, Disp: 90 tablet, Rfl: 2   Multiple Vitamins-Iron (MULTIVITAMINS WITH IRON) TABS, Take 1 tablet by mouth daily., Disp: , Rfl:    pantoprazole (PROTONIX) 40 MG tablet, TAKE ONE TABLET BY MOUTH IN THE MORNING, Disp: 90 tablet, Rfl: 1   polyethylene glycol (MIRALAX  / GLYCOLAX) 17 g packet, Take 17 g by mouth daily., Disp: , Rfl:    QUEtiapine (SEROQUEL) 25 MG tablet, Take 0.5 tablets (12.5 mg total) by mouth at bedtime as needed (for sleep)., Disp: 20 tablet, Rfl: 0   rosuvastatin (CRESTOR) 10 MG tablet, Take 1 tablet (10 mg total) by mouth daily., Disp: 90 tablet, Rfl: 3  EXAM:  GENERAL: alert, oriented, appears well and in no acute distress  HEENT: atraumatic, conjunttiva clear, no obvious abnormalities on inspection of external nose and ears  NECK: normal movements of the head and neck  LUNGS: on inspection no signs of respiratory distress, breathing rate appears normal, no obvious gross SOB, gasping or wheezing  CV: no obvious cyanosis  PSYCH/NEURO: pleasant and cooperative, no obvious depression or anxiety, speech and thought processing grossly intact  Skin - sacral wound - scabbed over.  No surrounding erythema.   ASSESSMENT AND PLAN:  Discussed the following assessment and plan:  Problem List Items  Addressed This Visit     Aneurysm artery, iliac common (HCC)   Reevaluation [facility] 01/18/22 - recommended f/u every other year.       Aortic atherosclerosis (HCC)   Continue crestor.        CAD (coronary artery disease) - Primary   Previous cath - 2 vessel disease.  Risk factor modification.  Continue crestor.       Relevant Orders   Amb Referral to Palliative Care   Carotid artery disease (HCC)   F/u 01/18/22 - carotid duplex today shows stable 1 to 39% right ICA stenosis with some progression of his left ICA stenosis now into the 40 to 59% range. Continue aspirin, Plavix, and Crestor. Follow-up - 12 months. Will need to arrange f/u once able.       Chronic diastolic CHF (congestive heart failure) (HCC)   Continues on metoprolol, hydralazine and documented furosemide.  Breathing stable.  Does not appear to be volume overloaded.  Will need to follow metabolic panel.  Hopefully home health nursing/palliative care can help with this.        Relevant Orders   Amb Referral to Palliative Care   CKD (chronic kidney disease), stage III (HCC)   Avoid antiinflammatory medication.  Will need to follow.  Plan for home health to check met b.       Relevant Orders   Amb Referral to Palliative Care   Essential hypertension, benign    Follow pressures.  Follow metabolic panel.  Continue current dose of metoprolol. Per documentation - remains on hydralazine/furosemide.       Hyperlipidemia   On crestor. Follow lipid panel and liver function tests.        Peripheral vascular disease (HCC)   Relevant Orders   Amb Referral to Palliative Care   Persistent atrial fibrillation (HCC)   Continues on metoprolol and eliquis.        Sleeping difficulty   Discussed sleep issues are probably multifactorial. Days and nights appear to be confused. Vivid dreams as outlined.  Started on seroquel as directed.  Does not feel helping. Will hold on changing medication. See if can get help in home, increase therapy and work on keeping awake during day and sleeping at night.  Follow.       Thrombocytopenia (HCC)   Saw hematology.  Recommended f/u 2-3x/year.  Follow cbc.       Weakness   With recent hospitalizations and infection, osteomyelitis, etc - increased leg weakness.  Home health PT and OT recommended.  Discussed palliative care.  Agreeable.  Order placed for referral.       Relevant Orders   Amb Referral to Palliative Care    Return if symptoms worsen or fail to improve.   I discussed the assessment and treatment plan with the patient. The patient was provided an opportunity to ask questions and all were answered. The patient agreed with the plan and demonstrated an understanding of the instructions.   The patient was advised to call back or seek an in-person evaluation if the symptoms worsen or if the condition fails to improve as anticipated.    Dale [place], MD

## 2023-03-23 ENCOUNTER — Encounter: Payer: Self-pay | Admitting: Internal Medicine

## 2023-03-23 NOTE — Assessment & Plan Note (Signed)
F/u 01/18/22 - carotid duplex today shows stable 1 to 39% right ICA stenosis with some progression of his left ICA stenosis now into the 40 to 59% range. Continue aspirin, Plavix, and Crestor. Follow-up - 12 months. Will need to arrange f/u once able.

## 2023-03-23 NOTE — Assessment & Plan Note (Signed)
Continues on metoprolol and eliquis.

## 2023-03-23 NOTE — Assessment & Plan Note (Signed)
With recent hospitalizations and infection, osteomyelitis, etc - increased leg weakness.  Home health PT and OT recommended.  Discussed palliative care.  Agreeable.  Order placed for referral.

## 2023-03-23 NOTE — Assessment & Plan Note (Signed)
Saw hematology.  Recommended f/u 2-3x/year.  Follow cbc.

## 2023-03-23 NOTE — Assessment & Plan Note (Signed)
Continues on metoprolol, hydralazine and documented furosemide.  Breathing stable.  Does not appear to be volume overloaded.  Will need to follow metabolic panel.  Hopefully home health nursing/palliative care can help with this.

## 2023-03-23 NOTE — Assessment & Plan Note (Signed)
Avoid antiinflammatory medication.  Will need to follow.  Plan for home health to check met b.

## 2023-03-23 NOTE — Assessment & Plan Note (Signed)
Continue crestor 

## 2023-03-23 NOTE — Assessment & Plan Note (Signed)
On crestor.  Follow lipid panel and liver function tests.   

## 2023-03-23 NOTE — Assessment & Plan Note (Signed)
Discussed sleep issues are probably multifactorial. Days and nights appear to be confused. Vivid dreams as outlined.  Started on seroquel as directed.  Does not feel helping. Will hold on changing medication. See if can get help in home, increase therapy and work on keeping awake during day and sleeping at night.  Follow.

## 2023-03-23 NOTE — Assessment & Plan Note (Signed)
Previous cath - 2 vessel disease.  Risk factor modification.  Continue crestor.

## 2023-03-23 NOTE — Assessment & Plan Note (Signed)
 Follow pressures.  Follow metabolic panel.  Continue current dose of metoprolol. Per documentation - remains on hydralazine/furosemide.

## 2023-03-23 NOTE — Assessment & Plan Note (Signed)
Reevaluation AVVS 01/18/22 - recommended f/u every other year.  

## 2023-03-28 ENCOUNTER — Telehealth (INDEPENDENT_AMBULATORY_CARE_PROVIDER_SITE_OTHER): Payer: Self-pay | Admitting: Vascular Surgery

## 2023-03-28 NOTE — Telephone Encounter (Signed)
Patient wife called concerned about getting patient to JD appointment on 05/02/23. Per Selena Batten, it's ok if she wanted to reschedule appointment to a later date if needed. If rescheduling appointment patient needs a Carotid + Evar [place] added to JD appointment

## 2023-04-01 ENCOUNTER — Telehealth: Payer: Self-pay

## 2023-04-01 ENCOUNTER — Ambulatory Visit (INDEPENDENT_AMBULATORY_CARE_PROVIDER_SITE_OTHER): Payer: Medicare HMO | Admitting: Vascular Surgery

## 2023-04-01 DIAGNOSIS — I5032 Chronic diastolic (congestive) heart failure: Secondary | ICD-10-CM

## 2023-04-01 DIAGNOSIS — I482 Chronic atrial fibrillation, unspecified: Secondary | ICD-10-CM

## 2023-04-01 DIAGNOSIS — I25118 Atherosclerotic heart disease of native coronary artery with other forms of angina pectoris: Secondary | ICD-10-CM

## 2023-04-01 DIAGNOSIS — N1832 Chronic kidney disease, stage 3b: Secondary | ICD-10-CM

## 2023-04-01 NOTE — Telephone Encounter (Signed)
Copied from CRM [id number]. Topic: Clinical - Medical Advice >> Apr 01, 2023  2:00 PM [place] wrote: Reason for CRM: Patient wife calling and wanted to speak with the Doctor regarding a Press photographer for her husband. Best contact # [phone]

## 2023-04-02 NOTE — Telephone Encounter (Signed)
See me about this.  With hospice, I do not think we can get home health also.  Will need to clarify with hospice prior to talking to her.

## 2023-04-02 NOTE — Telephone Encounter (Signed)
Wife says that palliative care came out and talked to her about hospice being a good option. Wife says she is trying to get her insurance switched back to [place] so he can have home health (the original home health.) Advised that if palliative care came out to eval or talked with them and thought he was a good candidate for hospice then hospice may be best option. Wife wants your opinion

## 2023-04-02 NOTE — Telephone Encounter (Signed)
If palliative care feels he would benefit from hospice - I agree and can place order for hospice. If she needs to talk to me let me know.

## 2023-04-02 NOTE — Telephone Encounter (Signed)
Patients wife had several questions about home health vs hospice and if both services could be provided. Also wanted to know what hospice offers and what that would look like for them. She asked if she could talk to you. Do you want me to set up a virtual visit for them?

## 2023-04-03 NOTE — Telephone Encounter (Signed)
Spoke with Raynelle Fanning at [employer] [employer]. If they are under hospice care, they cannot have home health services. It has to be one or the other. They can only have palliative care and home health service together.

## 2023-04-04 ENCOUNTER — Telehealth: Payer: Self-pay

## 2023-04-04 NOTE — Telephone Encounter (Signed)
Copied from CRM [id number]. Topic: General - Other >> Apr 04, 2023  1:41 PM Truddie Crumble wrote: Reason for CRM: patient spouse called stating she is waiting on the CMA to call her back regarding the patient home health

## 2023-04-05 NOTE — Addendum Note (Signed)
Addended by: Charm Barges on: 04/05/2023 01:35 PM   Modules accepted: Orders

## 2023-04-05 NOTE — Telephone Encounter (Signed)
I spoke to Brett Riley. Discussed palliative care visit and current medical issues. Discussed hospice referral. She is agreeable.  Order placed for hospice referral. I am not sure if we have to d/c any other home health services.

## 2023-04-07 NOTE — Telephone Encounter (Signed)
Please cancel HH referrals. Pt has been referred to hospice.

## 2023-04-07 NOTE — Telephone Encounter (Signed)
 See other note

## 2023-04-11 ENCOUNTER — Telehealth: Payer: Self-pay

## 2023-04-11 NOTE — Telephone Encounter (Signed)
 Copied from CRM [id number]. Topic: General - Other >> Apr 11, 2023  4:00 PM Thersia C wrote: Reason for CRM: Patient wife called in regarding request for a hospice nurse about changes in his prescriptions Wife is requesting a callback   Wifes phone number [phone]

## 2023-04-14 ENCOUNTER — Telehealth: Payer: Self-pay | Admitting: Internal Medicine

## 2023-04-14 NOTE — Telephone Encounter (Signed)
 See more recent note

## 2023-04-14 NOTE — Telephone Encounter (Signed)
 Spoke with wife this AM. She has reached out to hospice. We have not heard anything from them. Will f/u with hospice

## 2023-04-14 NOTE — Telephone Encounter (Signed)
 Copied from CRM [id number]. Topic: Clinical - Medication Question >> Apr 14, 2023  8:58 AM Lovett Ruck C wrote: Reason for CRM: Patient's wife is calling as patient's hospice nurse took patient off of 3 medications on Friday and they need to know if he needs to go back on any of them/if this change is affecting him adversely. Patient's wife is asking for a call back please. Thank you.

## 2023-04-15 NOTE — Telephone Encounter (Signed)
LM for hospice nurse to return my call.

## 2023-04-24 ENCOUNTER — Telehealth: Payer: Self-pay

## 2023-04-24 NOTE — Telephone Encounter (Signed)
 See more recent note

## 2023-04-24 NOTE — Telephone Encounter (Signed)
Called and spoke to Brett Riley. Discussed his vascular history. Have asked about continuing eliquis given his vascular history. Will d/c other medications.

## 2023-04-24 NOTE — Telephone Encounter (Signed)
Spoke with hospice nurse that did admission assessment on patient. The medications listed below were deemed not medically necessary during his hospice admission assessment and was advised to stop. If you disagree, we will need to call hospice and give orders for the following medications with his nurse that is following him (she did not give me a name, the nurse I spoke with was admission only)  Not medically necessary: Eliquis (per nurse once on hospice, eliquis is not medically necessary) Probiotic Multivitamin Crestor

## 2023-04-24 NOTE — Telephone Encounter (Signed)
Copied from CRM [id number]. Topic: Clinical - Medication Question >> Apr 24, 2023 11:31 AM [place] E wrote: Reason for CRM: Patient's wife, Gershon Cull, called in regarding 3 of the patient's medications -   apixaban (ELIQUIS) 2.5 MG TABS tablet rosuvastatin (CRESTOR) 10 MG tablet acidophilus (RISAQUAD) CAPS capsule  Wife stated that the hospice nurse advised patient to come off of these medications, but wanted to confirm with patient's PCP if that was alright. Wife just wants clarification if he is able to come off any or all of these. Callback number for wife is [phone] for clarification.

## 2023-04-25 ENCOUNTER — Other Ambulatory Visit: Payer: Self-pay

## 2023-04-25 ENCOUNTER — Telehealth: Payer: Self-pay

## 2023-04-25 MED ORDER — APIXABAN 2.5 MG PO TABS: 2.5000 mg | ORAL_TABLET | Freq: Two times a day (BID) | ORAL | 1 refills | Status: AC

## 2023-04-25 NOTE — Telephone Encounter (Signed)
Copied from CRM [id number]. Topic: General - Other >> Apr 25, 2023 12:48 PM Elizebeth Brooking wrote: Reason for CRM: Wife would like for someone to please give her a call about the patient please call her at [phone]

## 2023-04-25 NOTE — Telephone Encounter (Signed)
 See other note

## 2023-04-25 NOTE — Telephone Encounter (Signed)
Called to speak with wife. Advised that we are going to continue eliquis and d/c all other medications that were listed. Patients wife had several concerns about hospice and "end of life" care because she was given and end of life pamphelet. Was concerned that Dr Lorin Picket was not going to be the attending for pt. Clarified with patient that Dr Lorin Picket has agreed to be the attending. Wifes concern about communication with the office resolved- explained there was a delay getting clarification with hospice. Called and spoke with hospice nurse Vernona Rieger to confirm that everyone was on the same page. Nothing further needed at this time. Rx sent in for eliquis.

## 2023-07-01 ENCOUNTER — Ambulatory Visit (INDEPENDENT_AMBULATORY_CARE_PROVIDER_SITE_OTHER): Payer: Self-pay | Admitting: Vascular Surgery

## 2023-07-01 ENCOUNTER — Encounter (INDEPENDENT_AMBULATORY_CARE_PROVIDER_SITE_OTHER): Payer: Self-pay

## 2023-07-01 ENCOUNTER — Other Ambulatory Visit (INDEPENDENT_AMBULATORY_CARE_PROVIDER_SITE_OTHER): Payer: Self-pay

## 2023-07-18 ENCOUNTER — Other Ambulatory Visit: Payer: Self-pay | Admitting: Internal Medicine

## 2023-07-22 ENCOUNTER — Encounter (INDEPENDENT_AMBULATORY_CARE_PROVIDER_SITE_OTHER): Payer: Self-pay

## 2023-08-16 ENCOUNTER — Other Ambulatory Visit: Payer: Self-pay | Admitting: Internal Medicine

## 2023-08-18 ENCOUNTER — Other Ambulatory Visit: Payer: Self-pay | Admitting: Internal Medicine

## 2023-10-25 ENCOUNTER — Other Ambulatory Visit: Payer: Self-pay | Admitting: Internal Medicine

## 2023-10-29 NOTE — Telephone Encounter (Signed)
Rx ok'd for hydralazine.

## 2023-12-12 ENCOUNTER — Telehealth (INDEPENDENT_AMBULATORY_CARE_PROVIDER_SITE_OTHER): Admitting: Nurse Practitioner

## 2023-12-12 ENCOUNTER — Encounter: Payer: Self-pay | Admitting: Nurse Practitioner

## 2023-12-12 ENCOUNTER — Ambulatory Visit: Payer: Self-pay

## 2023-12-12 VITALS — BP 134/64 | HR 53 | Temp 97.6°F | Ht 72.0 in | Wt 184.0 lb

## 2023-12-12 DIAGNOSIS — B351 Tinea unguium: Secondary | ICD-10-CM

## 2023-12-12 MED ORDER — SULFAMETHOXAZOLE-TRIMETHOPRIM 800-160 MG PO TABS: 1.0000 | ORAL_TABLET | Freq: Two times a day (BID) | ORAL | 0 refills | Status: AC

## 2023-12-12 NOTE — Progress Notes (Signed)
 Virtual Visit via Video Note  I connected with Brett Riley on 12/12/23 at 4:02 PM by a video enabled telemedicine application and verified that I am speaking with the correct person using two identifiers.  Patient Location: Home Provider Location: Office/Clinic  I discussed the limitations, risks, security, and privacy concerns of performing an evaluation and management service by video and the availability of in person appointments. I also discussed with the patient that there may be a patient responsible charge related to this service. The patient expressed understanding and agreed to proceed.  Subjective: PCP: Glendia Shad, MD  Chief Complaint  Patient presents with   Acute Visit    Infected left big toe Really red x 4-5 days warm to the touch Big on side of left toe like a build up of blood x 2-3 days Pain when touching   HPI  Brett Riley is an 88 year old male who is seen accompanied by his wife. He has swollen, red, and warm left big toe. He is accompanied by a caregiver.  The left big toe is swollen, red, and warm, with slight bleeding and possible discharge. Symptoms have worsened over the past week. In August, he completed a course of Bactrim , which initially resolved the redness. Caregivers have been applying hot compresses. Toenails were clipped a couple of weeks ago. There is no fever or chills. The toe is tender to touch but not painful unless touched or bumped. The bottom of the foot is also red and swollen. ROS: Per HPI  Current Outpatient Medications:    acetaminophen  (TYLENOL ) 500 MG tablet, Take 1,000 mg by mouth every 8 (eight) hours. For knee pain, Disp: , Rfl:    acidophilus (RISAQUAD) CAPS capsule, Take 1 capsule by mouth daily., Disp: 30 capsule, Rfl: 0   diltiazem  (CARDIZEM  CD) 360 MG 24 hr capsule, TAKE 1 CAPSULE BY MOUTH EVERY DAY, Disp: 90 capsule, Rfl: 1   furosemide  (LASIX ) 20 MG tablet, TAKE 1 TABLET (20 MG TOTAL) BY MOUTH EVERY MONDAY,  WEDNESDAY AND FRIDAY, Disp: 30 tablet, Rfl: 2   hydrALAZINE  (APRESOLINE ) 10 MG tablet, TAKE 1 TABLET BY MOUTH THREE TIMES A DAY, Disp: 270 tablet, Rfl: 0   metoprolol  succinate (TOPROL -XL) 100 MG 24 hr tablet, TAKE 1 TABLET BY MOUTH EVERY DAY WITH A MEAL, Disp: 90 tablet, Rfl: 2   Multiple Vitamins-Iron  (MULTIVITAMINS WITH IRON ) TABS, Take 1 tablet by mouth daily., Disp: , Rfl:    pantoprazole  (PROTONIX ) 40 MG tablet, TAKE 1 TABLET BY MOUTH EVERY MORNING, Disp: 90 tablet, Rfl: 1   polyethylene glycol (MIRALAX  / GLYCOLAX ) 17 g packet, Take 17 g by mouth daily., Disp: , Rfl:    QUEtiapine  (SEROQUEL ) 25 MG tablet, Take 0.5 tablets (12.5 mg total) by mouth at bedtime as needed (for sleep)., Disp: 20 tablet, Rfl: 0   rosuvastatin  (CRESTOR ) 10 MG tablet, Take 1 tablet (10 mg total) by mouth daily., Disp: 90 tablet, Rfl: 3   sulfamethoxazole -trimethoprim  (BACTRIM  DS) 800-160 MG tablet, Take 1 tablet by mouth 2 (two) times daily., Disp: 20 tablet, Rfl: 0   apixaban  (ELIQUIS ) 2.5 MG TABS tablet, Take 1 tablet (2.5 mg total) by mouth 2 (two) times daily. (Patient not taking: Reported on 12/12/2023), Disp: 180 tablet, Rfl: 1   clopidogrel  (PLAVIX ) 75 MG tablet, Take 1 tablet (75 mg total) by mouth daily. (Patient not taking: Reported on 12/12/2023), Disp: 90 tablet, Rfl: 2  Observations/Objective: Today's Vitals   12/12/23 1534  BP: 134/64  Pulse: ROLLEN)  53  Temp: 97.6 F (36.4 C)  SpO2: 96%  Weight: 184 lb (83.5 kg)  Height: 6' (1.829 m)   Physical Exam Constitutional:      General: He is not in acute distress.    Appearance: Normal appearance. He is not ill-appearing.  Eyes:     Conjunctiva/sclera: Conjunctivae normal.  Pulmonary:     Effort: No respiratory distress.  Neurological:     Mental Status: He is alert.  Psychiatric:        Mood and Affect: Mood normal.        Behavior: Behavior normal.        Thought Content: Thought content normal.        Judgment: Judgment normal.      Assessment and Plan: Onychomycosis Assessment & Plan: Left great toe with signs of infection. Previous improvement with Bactrim , now worsened. No systemic symptoms. Ingrown nail contributing to infection. - Prescribed Bactrim , pick up at [facility] in [place]. - Advised warm compresses for toe. - Instructed to photograph toe for monitoring.   Other orders -     Sulfamethoxazole -Trimethoprim ; Take 1 tablet by mouth 2 (two) times daily.  Dispense: 20 tablet; Refill: 0    Follow Up Instructions: Return if symptoms worsen or fail to improve.   I discussed the assessment and treatment plan with the patient. The patient was provided an opportunity to ask questions, and all were answered. The patient agreed with the plan and demonstrated an understanding of the instructions.   The patient was advised to call back or seek an in-person evaluation if the symptoms worsen or if the condition fails to improve as anticipated.  The above assessment and management plan was discussed with the patient. The patient verbalized understanding of and has agreed to the management plan.   Antawn Sison, NP

## 2023-12-12 NOTE — Telephone Encounter (Signed)
 Pt is scheduled to see Collette Aurora, NP this afternoon.

## 2023-12-12 NOTE — Telephone Encounter (Signed)
 FYI Only or Action Required?: FYI only for provider. See note below.  Patient was last seen in primary care on 02/06/2023 by Glendia Shad, MD.  Called Nurse Triage reporting No chief complaint on file..  Symptoms began several months ago.  Interventions attempted: Prescription medications: Bactrim  DS.1 BID, 10/07/2023- 10/21/2023  Symptoms are: gradually worsening.  Triage Disposition: See Physician Within 24 Hours  Patient/caregiver understands and will follow disposition?:  - Virtual visit scheduled.                  Copied from CRM [id number]. Topic: Clinical - Red Word Triage >> Dec 12, 2023 12:39 PM [place] A wrote: [facility] that prompted transfer to Nurse Triage: Home Health is doing home visit current- Left great tow has blistering and draining. Warm to touch and painful Reason for Disposition  Looks like a boil, infected sore, or deep ulcer  Answer Assessment - Initial Assessment Questions Spoke with home hospice nurse [facility] has what appears to be an infected great toe. Toe was recenty infected. PT was prescribed antibiotics by hospice provider. Bactrim  DS 800mg /160mg . Toe improved greatly, but now infection has returned. Nurse states that toe is very warm red, painful and swollen. She thinks that pus would appear if she pressed on the toe.She thinks there may be an ingrown toenail. She states that it appears that someone has tried to trim that toe nail. Pt is bed bound.  Scheduled video visit. Pt did not receive link. In the past they have had trouble connecting for VV. In the past someone has called them and sent them a link which has been successful.  Called CAL to arrange separate call and link to be sent. CAL does not think  vv will be appropriate and will pass information to Dr. Glendia. Cal stated she will call pt back regarding further instructions for appointment. Home Hospice nurse would like to be made aware of any new orders Newell Satchel  [phone].     1. ONSET: When did the pain start?      At least a month 2. LOCATION: Where is the pain located?   (e.g., around nail, entire toe, at foot joint)      Left great toe 3. PAIN: How bad is the pain?    (Scale 1-10; or mild, moderate, severe)      4. APPEARANCE: What does the toe look like? (e.g., redness, swelling, bruising, pallor)     Red swollen warm 5. CAUSE: What do you think is causing the toe pain?     Infections 6. OTHER SYMPTOMS: Do you have any other symptoms? (e.g., leg pain, rash, fever, numbness)     No fever.  Protocols used: Toe Pain-A-AH

## 2023-12-29 DIAGNOSIS — B351 Tinea unguium: Secondary | ICD-10-CM | POA: Insufficient documentation

## 2023-12-29 NOTE — Assessment & Plan Note (Signed)
 Left great toe with signs of infection. Previous improvement with Bactrim , now worsened. No systemic symptoms. Ingrown nail contributing to infection. - Prescribed Bactrim , pick up at [facility] in [place]. - Advised warm compresses for toe. - Instructed to photograph toe for monitoring.

## 2024-01-20 ENCOUNTER — Other Ambulatory Visit (INDEPENDENT_AMBULATORY_CARE_PROVIDER_SITE_OTHER): Payer: Medicare HMO

## 2024-01-20 ENCOUNTER — Ambulatory Visit (INDEPENDENT_AMBULATORY_CARE_PROVIDER_SITE_OTHER): Payer: Medicare HMO | Admitting: Vascular Surgery

## 2024-02-27 ENCOUNTER — Other Ambulatory Visit: Payer: Self-pay | Admitting: Internal Medicine

## 2024-03-18 ENCOUNTER — Other Ambulatory Visit: Payer: Self-pay | Admitting: Internal Medicine

## 2024-03-22 ENCOUNTER — Telehealth: Payer: Self-pay | Admitting: *Deleted

## 2024-03-22 ENCOUNTER — Other Ambulatory Visit: Payer: Self-pay | Admitting: Internal Medicine

## 2024-03-22 NOTE — Telephone Encounter (Unsigned)
 Copied from CRM [id number]. Topic: General - Other >> Mar 22, 2024  1:21 PM Deleta RAMAN wrote: Reason for CRM: patient was informed an appointment needs to be scheduled his wife states he is bed bound and will need a virtual. Patient wife would like to know if this is okay for for medication refill. [phone]

## 2024-03-22 NOTE — Telephone Encounter (Signed)
 Please call schedule Medication follow up appointment not seen in one year.

## 2024-03-22 NOTE — Telephone Encounter (Signed)
 Lm to call the office to schedule a follow up on medication. Patient is over due to be seen.  E2C2 please schedule appt.

## 2024-03-22 NOTE — Telephone Encounter (Signed)
 Called to have appt scheduled not seen since 03/2023

## 2024-03-23 ENCOUNTER — Telehealth: Payer: Self-pay

## 2024-03-23 NOTE — Telephone Encounter (Signed)
 Virtual appointment is fine. Can schedule Pt with time and date that is good for him

## 2024-03-23 NOTE — Telephone Encounter (Signed)
 Refilled x 1. See note regarding scheduling f/u appt.

## 2024-03-24 NOTE — Telephone Encounter (Signed)
 Called patient on 03/23/24 and 03/24/24 and left a message to call office to schedule a virtual. Ok'd by Dr Glendia.

## 2024-03-26 ENCOUNTER — Other Ambulatory Visit: Payer: Self-pay | Admitting: Internal Medicine

## 2024-04-15 ENCOUNTER — Ambulatory Visit: Admitting: Internal Medicine
